# Patient Record
Sex: Female | Born: 1940 | Race: White | Hispanic: No | Marital: Married
Health system: Southern US, Community
[De-identification: ages and names within clinical notes are randomized; demographics above are authoritative.]

## PROBLEM LIST (undated history)

## (undated) DIAGNOSIS — I483 Typical atrial flutter: Secondary | ICD-10-CM

## (undated) DIAGNOSIS — M109 Gout, unspecified: Secondary | ICD-10-CM

## (undated) DIAGNOSIS — I10 Essential (primary) hypertension: Secondary | ICD-10-CM

## (undated) DIAGNOSIS — E875 Hyperkalemia: Secondary | ICD-10-CM

## (undated) DIAGNOSIS — J449 Chronic obstructive pulmonary disease, unspecified: Secondary | ICD-10-CM

## (undated) DIAGNOSIS — Z954 Presence of other heart-valve replacement: Secondary | ICD-10-CM

## (undated) DIAGNOSIS — R011 Cardiac murmur, unspecified: Secondary | ICD-10-CM

## (undated) DIAGNOSIS — G4710 Hypersomnia, unspecified: Secondary | ICD-10-CM

## (undated) DIAGNOSIS — N289 Disorder of kidney and ureter, unspecified: Secondary | ICD-10-CM

## (undated) DIAGNOSIS — I5189 Other ill-defined heart diseases: Secondary | ICD-10-CM

## (undated) DIAGNOSIS — K8020 Calculus of gallbladder without cholecystitis without obstruction: Secondary | ICD-10-CM

## (undated) DIAGNOSIS — I219 Acute myocardial infarction, unspecified: Secondary | ICD-10-CM

## (undated) DIAGNOSIS — N200 Calculus of kidney: Secondary | ICD-10-CM

## (undated) DIAGNOSIS — Z5189 Encounter for other specified aftercare: Secondary | ICD-10-CM

## (undated) DIAGNOSIS — N183 Chronic kidney disease, stage 3 (moderate): Secondary | ICD-10-CM

## (undated) DIAGNOSIS — I442 Atrioventricular block, complete: Secondary | ICD-10-CM

## (undated) DIAGNOSIS — Z950 Presence of cardiac pacemaker: Secondary | ICD-10-CM

## (undated) DIAGNOSIS — G4733 Obstructive sleep apnea (adult) (pediatric): Secondary | ICD-10-CM

## (undated) DIAGNOSIS — K219 Gastro-esophageal reflux disease without esophagitis: Secondary | ICD-10-CM

## (undated) DIAGNOSIS — K635 Polyp of colon: Secondary | ICD-10-CM

## (undated) DIAGNOSIS — L409 Psoriasis, unspecified: Secondary | ICD-10-CM

## (undated) DIAGNOSIS — F32A Depression, unspecified: Secondary | ICD-10-CM

## (undated) DIAGNOSIS — F329 Major depressive disorder, single episode, unspecified: Secondary | ICD-10-CM

## (undated) DIAGNOSIS — R8281 Pyuria: Secondary | ICD-10-CM

## (undated) DIAGNOSIS — E119 Type 2 diabetes mellitus without complications: Secondary | ICD-10-CM

## (undated) DIAGNOSIS — I422 Other hypertrophic cardiomyopathy: Secondary | ICD-10-CM

## (undated) HISTORY — PX: COLONOSCOPY: SHX174

## (undated) HISTORY — PX: TONSILLECTOMY: SUR1361

## (undated) HISTORY — DX: Psoriasis, unspecified: L40.9

## (undated) HISTORY — DX: Encounter for other specified aftercare: Z51.89

## (undated) HISTORY — DX: Calculus of gallbladder without cholecystitis without obstruction: K80.20

## (undated) HISTORY — DX: Hyperkalemia: E87.5

## (undated) HISTORY — DX: Cardiac murmur, unspecified: R01.1

## (undated) HISTORY — PX: BREAST CYST EXCISION: SHX579

## (undated) HISTORY — DX: Hypersomnia, unspecified: G47.10

## (undated) HISTORY — PX: APPENDECTOMY: SHX54

## (undated) HISTORY — DX: Other ill-defined heart diseases: I51.89

## (undated) HISTORY — PX: TOOTH EXTRACTION: SUR596

## (undated) HISTORY — DX: Other hypertrophic cardiomyopathy: I42.2

## (undated) HISTORY — PX: ABDOMINAL HYSTERECTOMY: SHX81

## (undated) HISTORY — DX: Major depressive disorder, single episode, unspecified: F32.9

## (undated) HISTORY — DX: Polyp of colon: K63.5

## (undated) HISTORY — DX: Chronic kidney disease, stage 3 (moderate): N18.3

## (undated) HISTORY — DX: Depression, unspecified: F32.A

## (undated) HISTORY — DX: Obstructive sleep apnea (adult) (pediatric): G47.33

## (undated) HISTORY — DX: Pyuria: R82.81

## (undated) HISTORY — DX: Gout, unspecified: M10.9

## (undated) HISTORY — DX: Calculus of kidney: N20.0

## (undated) HISTORY — DX: Gastro-esophageal reflux disease without esophagitis: K21.9

## (undated) HISTORY — DX: Disorder of kidney and ureter, unspecified: N28.9

## (undated) HISTORY — PX: POLYPECTOMY: SHX149

## (undated) HISTORY — DX: Presence of other heart-valve replacement: Z95.4

## (undated) HISTORY — PX: CHOLECYSTECTOMY: SHX55

## (undated) HISTORY — DX: Typical atrial flutter: I48.3

## (undated) HISTORY — DX: Type 2 diabetes mellitus without complications: E11.9

## (undated) HISTORY — DX: Atrioventricular block, complete: I44.2

## (undated) HISTORY — DX: Essential (primary) hypertension: I10

## (undated) HISTORY — DX: Chronic obstructive pulmonary disease, unspecified: J44.9

---

## 2007-06-15 ENCOUNTER — Ambulatory Visit (HOSPITAL_BASED_OUTPATIENT_CLINIC_OR_DEPARTMENT_OTHER): Admission: RE | Admit: 2007-06-15 | Discharge: 2007-06-15 | Payer: Self-pay | Admitting: *Deleted

## 2007-06-20 ENCOUNTER — Ambulatory Visit: Payer: Self-pay | Admitting: Internal Medicine

## 2007-07-09 ENCOUNTER — Ambulatory Visit: Payer: Self-pay | Admitting: Pulmonary Disease

## 2007-08-09 DIAGNOSIS — I119 Hypertensive heart disease without heart failure: Secondary | ICD-10-CM | POA: Insufficient documentation

## 2007-08-09 DIAGNOSIS — J309 Allergic rhinitis, unspecified: Secondary | ICD-10-CM | POA: Insufficient documentation

## 2007-08-10 ENCOUNTER — Ambulatory Visit: Payer: Self-pay | Admitting: Pulmonary Disease

## 2007-08-10 DIAGNOSIS — G4733 Obstructive sleep apnea (adult) (pediatric): Secondary | ICD-10-CM | POA: Insufficient documentation

## 2007-11-19 ENCOUNTER — Telehealth (INDEPENDENT_AMBULATORY_CARE_PROVIDER_SITE_OTHER): Payer: Self-pay | Admitting: *Deleted

## 2007-11-22 ENCOUNTER — Ambulatory Visit: Payer: Self-pay | Admitting: Pulmonary Disease

## 2007-11-26 ENCOUNTER — Encounter: Payer: Self-pay | Admitting: Pulmonary Disease

## 2007-12-22 ENCOUNTER — Encounter: Payer: Self-pay | Admitting: Pulmonary Disease

## 2008-01-04 ENCOUNTER — Ambulatory Visit: Payer: Self-pay | Admitting: Pulmonary Disease

## 2008-08-10 ENCOUNTER — Ambulatory Visit: Payer: Self-pay | Admitting: Internal Medicine

## 2008-08-10 DIAGNOSIS — R011 Cardiac murmur, unspecified: Secondary | ICD-10-CM | POA: Insufficient documentation

## 2008-08-10 DIAGNOSIS — J209 Acute bronchitis, unspecified: Secondary | ICD-10-CM | POA: Insufficient documentation

## 2008-08-10 DIAGNOSIS — R209 Unspecified disturbances of skin sensation: Secondary | ICD-10-CM | POA: Insufficient documentation

## 2008-08-10 DIAGNOSIS — R9431 Abnormal electrocardiogram [ECG] [EKG]: Secondary | ICD-10-CM | POA: Insufficient documentation

## 2008-08-10 DIAGNOSIS — K219 Gastro-esophageal reflux disease without esophagitis: Secondary | ICD-10-CM | POA: Insufficient documentation

## 2008-08-10 DIAGNOSIS — N259 Disorder resulting from impaired renal tubular function, unspecified: Secondary | ICD-10-CM | POA: Insufficient documentation

## 2008-08-10 DIAGNOSIS — J440 Chronic obstructive pulmonary disease with acute lower respiratory infection: Secondary | ICD-10-CM

## 2008-08-10 LAB — CONVERTED CEMR LAB
Albumin: 3.8 g/dL (ref 3.5–5.2)
Alkaline Phosphatase: 103 units/L (ref 39–117)
Basophils Relative: 0 % (ref 0.0–3.0)
Bilirubin Urine: NEGATIVE
Bilirubin, Direct: 0.1 mg/dL (ref 0.0–0.3)
CO2: 32 meq/L (ref 19–32)
Calcium: 9.8 mg/dL (ref 8.4–10.5)
Crystals: NEGATIVE
Eosinophils Relative: 2.5 % (ref 0.0–5.0)
Glucose, Bld: 99 mg/dL (ref 70–99)
HCT: 43.8 % (ref 36.0–46.0)
Hemoglobin: 14.8 g/dL (ref 12.0–15.0)
Lymphocytes Relative: 16.4 % (ref 12.0–46.0)
Monocytes Absolute: 0.6 10*3/uL (ref 0.1–1.0)
Monocytes Relative: 8.2 % (ref 3.0–12.0)
Mucus, UA: NEGATIVE
Neutrophils Relative %: 72.9 % (ref 43.0–77.0)
Platelets: 222 10*3/uL (ref 150–400)
RBC: 4.94 M/uL (ref 3.87–5.11)
RDW: 12.8 % (ref 11.5–14.6)
Specific Gravity, Urine: >=1.03 (ref 1.000–1.03)
TSH: 1.93 microintl units/mL (ref 0.35–5.50)
Total Protein: 7.8 g/dL (ref 6.0–8.3)
pH: 6 (ref 5.0–8.0)

## 2008-08-11 ENCOUNTER — Encounter: Payer: Self-pay | Admitting: Internal Medicine

## 2008-08-11 ENCOUNTER — Telehealth: Payer: Self-pay | Admitting: Internal Medicine

## 2008-08-14 ENCOUNTER — Telehealth: Payer: Self-pay | Admitting: Internal Medicine

## 2008-08-16 ENCOUNTER — Ambulatory Visit: Payer: Self-pay | Admitting: Pulmonary Disease

## 2008-08-18 ENCOUNTER — Encounter: Payer: Self-pay | Admitting: Internal Medicine

## 2008-08-18 ENCOUNTER — Ambulatory Visit: Payer: Self-pay

## 2008-08-21 ENCOUNTER — Encounter: Payer: Self-pay | Admitting: Internal Medicine

## 2008-08-25 ENCOUNTER — Ambulatory Visit: Payer: Self-pay | Admitting: Internal Medicine

## 2008-08-25 DIAGNOSIS — R82998 Other abnormal findings in urine: Secondary | ICD-10-CM | POA: Insufficient documentation

## 2008-08-25 DIAGNOSIS — I5032 Chronic diastolic (congestive) heart failure: Secondary | ICD-10-CM | POA: Insufficient documentation

## 2008-08-25 LAB — CONVERTED CEMR LAB
Ketones, ur: NEGATIVE mg/dL
Mucus, UA: NEGATIVE
Nitrite: NEGATIVE
Specific Gravity, Urine: >=1.03 (ref 1.000–1.03)
Urine Glucose: NEGATIVE mg/dL
Urobilinogen, UA: 1 (ref 0.0–1.0)
pH: 5 (ref 5.0–8.0)

## 2008-08-27 ENCOUNTER — Encounter: Payer: Self-pay | Admitting: Internal Medicine

## 2008-09-12 ENCOUNTER — Encounter: Payer: Self-pay | Admitting: Internal Medicine

## 2008-09-20 ENCOUNTER — Encounter: Payer: Self-pay | Admitting: Internal Medicine

## 2008-11-08 ENCOUNTER — Encounter: Payer: Self-pay | Admitting: Internal Medicine

## 2008-11-28 ENCOUNTER — Ambulatory Visit: Payer: Self-pay | Admitting: Internal Medicine

## 2008-11-28 DIAGNOSIS — F324 Major depressive disorder, single episode, in partial remission: Secondary | ICD-10-CM | POA: Insufficient documentation

## 2008-12-13 ENCOUNTER — Ambulatory Visit: Payer: Self-pay | Admitting: Internal Medicine

## 2008-12-13 LAB — CONVERTED CEMR LAB
Chloride: 105 meq/L (ref 96–112)
Hemoglobin, Urine: NEGATIVE
Magnesium: 2.2 mg/dL (ref 1.5–2.5)
Sodium: 140 meq/L (ref 135–145)
Specific Gravity, Urine: 1.025 (ref 1.000–1.030)
Urine Glucose: NEGATIVE mg/dL
Urobilinogen, UA: 0.2 (ref 0.0–1.0)

## 2009-01-17 ENCOUNTER — Telehealth: Payer: Self-pay | Admitting: Internal Medicine

## 2009-03-14 ENCOUNTER — Ambulatory Visit: Payer: Self-pay | Admitting: Internal Medicine

## 2009-04-24 ENCOUNTER — Telehealth: Payer: Self-pay | Admitting: Internal Medicine

## 2009-05-02 ENCOUNTER — Ambulatory Visit: Payer: Self-pay | Admitting: Internal Medicine

## 2009-05-16 ENCOUNTER — Encounter: Payer: Self-pay | Admitting: Internal Medicine

## 2009-07-11 ENCOUNTER — Ambulatory Visit: Payer: Self-pay | Admitting: Internal Medicine

## 2009-08-04 DIAGNOSIS — I219 Acute myocardial infarction, unspecified: Secondary | ICD-10-CM

## 2009-08-04 HISTORY — DX: Acute myocardial infarction, unspecified: I21.9

## 2009-08-15 ENCOUNTER — Encounter: Payer: Self-pay | Admitting: Pulmonary Disease

## 2009-08-16 ENCOUNTER — Ambulatory Visit: Payer: Self-pay | Admitting: Pulmonary Disease

## 2009-08-16 DIAGNOSIS — G4710 Hypersomnia, unspecified: Secondary | ICD-10-CM | POA: Insufficient documentation

## 2009-08-23 ENCOUNTER — Encounter: Payer: Self-pay | Admitting: Internal Medicine

## 2009-10-31 ENCOUNTER — Ambulatory Visit: Payer: Self-pay | Admitting: Internal Medicine

## 2009-10-31 LAB — CONVERTED CEMR LAB
AST: 24 units/L (ref 0–37)
Alkaline Phosphatase: 109 units/L (ref 39–117)
Basophils Absolute: 0.1 10*3/uL (ref 0.0–0.1)
Basophils Relative: 0.9 % (ref 0.0–3.0)
Bilirubin, Direct: 0.1 mg/dL (ref 0.0–0.3)
Calcium: 9.7 mg/dL (ref 8.4–10.5)
Chloride: 99 meq/L (ref 96–112)
Creatinine, Ser: 1.3 mg/dL — ABNORMAL HIGH (ref 0.4–1.2)
GFR calc non Af Amer: 43.52 mL/min (ref >60)
HDL: 43.8 mg/dL (ref >39.00)
Hemoglobin: 13.1 g/dL (ref 12.0–15.0)
LDL Cholesterol: 81 mg/dL (ref 0–99)
Lymphs Abs: 0.9 10*3/uL (ref 0.7–4.0)
MCHC: 33.4 g/dL (ref 30.0–36.0)
MCV: 90.4 fL (ref 78.0–100.0)
Monocytes Absolute: 0.5 10*3/uL (ref 0.1–1.0)
Monocytes Relative: 8.1 % (ref 3.0–12.0)
Neutro Abs: 4.3 10*3/uL (ref 1.4–7.7)
Neutrophils Relative %: 73.9 % (ref 43.0–77.0)
Platelets: 174 10*3/uL (ref 150.0–400.0)
Potassium: 4 meq/L (ref 3.5–5.1)
RBC: 4.34 M/uL (ref 3.87–5.11)
RDW: 12.3 % (ref 11.5–14.6)
Sodium: 137 meq/L (ref 135–145)
Tissue Transglutaminase Ab, IgA: 0 units (ref <7)
Total CHOL/HDL Ratio: 3
VLDL: 23.4 mg/dL (ref 0.0–40.0)

## 2009-11-01 ENCOUNTER — Encounter: Payer: Self-pay | Admitting: Internal Medicine

## 2009-11-07 ENCOUNTER — Encounter: Payer: Self-pay | Admitting: Internal Medicine

## 2009-11-14 ENCOUNTER — Encounter: Payer: Self-pay | Admitting: Internal Medicine

## 2009-11-29 ENCOUNTER — Encounter: Payer: Self-pay | Admitting: Pulmonary Disease

## 2009-12-20 ENCOUNTER — Ambulatory Visit: Payer: Self-pay | Admitting: Gastroenterology

## 2010-01-02 ENCOUNTER — Ambulatory Visit: Payer: Self-pay | Admitting: Internal Medicine

## 2010-01-10 ENCOUNTER — Encounter (INDEPENDENT_AMBULATORY_CARE_PROVIDER_SITE_OTHER): Payer: Self-pay | Admitting: *Deleted

## 2010-01-10 ENCOUNTER — Ambulatory Visit: Payer: Self-pay | Admitting: Gastroenterology

## 2010-01-30 ENCOUNTER — Ambulatory Visit: Payer: Self-pay | Admitting: Gastroenterology

## 2010-02-05 ENCOUNTER — Encounter: Payer: Self-pay | Admitting: Gastroenterology

## 2010-03-20 ENCOUNTER — Ambulatory Visit: Payer: Self-pay | Admitting: Internal Medicine

## 2010-04-02 ENCOUNTER — Ambulatory Visit: Payer: Self-pay | Admitting: Internal Medicine

## 2010-04-02 DIAGNOSIS — J45901 Unspecified asthma with (acute) exacerbation: Secondary | ICD-10-CM | POA: Insufficient documentation

## 2010-04-16 ENCOUNTER — Telehealth: Payer: Self-pay | Admitting: Internal Medicine

## 2010-04-24 ENCOUNTER — Ambulatory Visit: Payer: Self-pay | Admitting: Internal Medicine

## 2010-04-24 ENCOUNTER — Encounter: Payer: Self-pay | Admitting: Internal Medicine

## 2010-04-24 DIAGNOSIS — R079 Chest pain, unspecified: Secondary | ICD-10-CM | POA: Insufficient documentation

## 2010-04-24 LAB — CONVERTED CEMR LAB
BUN: 34 mg/dL — ABNORMAL HIGH (ref 6–23)
GFR calc non Af Amer: 33.71 mL/min (ref >60)
Glucose, Bld: 116 mg/dL — ABNORMAL HIGH (ref 70–99)
Sodium: 141 meq/L (ref 135–145)

## 2010-05-15 ENCOUNTER — Ambulatory Visit: Payer: Self-pay | Admitting: Internal Medicine

## 2010-05-15 DIAGNOSIS — E875 Hyperkalemia: Secondary | ICD-10-CM | POA: Insufficient documentation

## 2010-05-15 LAB — CONVERTED CEMR LAB
BUN: 26 mg/dL — ABNORMAL HIGH (ref 6–23)
Calcium: 9.7 mg/dL (ref 8.4–10.5)
Chloride: 100 meq/L (ref 96–112)
Creatinine, Ser: 1.4 mg/dL — ABNORMAL HIGH (ref 0.4–1.2)
GFR calc non Af Amer: 39.24 mL/min (ref >60)

## 2010-05-20 ENCOUNTER — Telehealth: Payer: Self-pay | Admitting: Internal Medicine

## 2010-05-21 ENCOUNTER — Ambulatory Visit: Payer: Self-pay | Admitting: Internal Medicine

## 2010-05-21 DIAGNOSIS — D692 Other nonthrombocytopenic purpura: Secondary | ICD-10-CM | POA: Insufficient documentation

## 2010-05-22 ENCOUNTER — Ambulatory Visit: Payer: Self-pay | Admitting: Cardiovascular Disease

## 2010-05-22 ENCOUNTER — Telehealth: Payer: Self-pay | Admitting: Internal Medicine

## 2010-05-22 DIAGNOSIS — I421 Obstructive hypertrophic cardiomyopathy: Secondary | ICD-10-CM | POA: Insufficient documentation

## 2010-06-18 ENCOUNTER — Telehealth (INDEPENDENT_AMBULATORY_CARE_PROVIDER_SITE_OTHER): Payer: Self-pay | Admitting: Radiology

## 2010-06-19 ENCOUNTER — Ambulatory Visit: Payer: Self-pay

## 2010-06-19 ENCOUNTER — Ambulatory Visit: Payer: Self-pay | Admitting: Cardiovascular Disease

## 2010-06-19 ENCOUNTER — Encounter: Payer: Self-pay | Admitting: Cardiovascular Disease

## 2010-06-19 ENCOUNTER — Ambulatory Visit (HOSPITAL_COMMUNITY): Admission: RE | Admit: 2010-06-19 | Discharge: 2010-06-19 | Payer: Self-pay | Admitting: Cardiovascular Disease

## 2010-06-19 ENCOUNTER — Encounter (HOSPITAL_COMMUNITY)
Admission: RE | Admit: 2010-06-19 | Discharge: 2010-08-03 | Payer: Self-pay | Source: Home / Self Care | Attending: Cardiovascular Disease | Admitting: Cardiovascular Disease

## 2010-07-17 ENCOUNTER — Ambulatory Visit: Payer: Self-pay | Admitting: Internal Medicine

## 2010-07-18 ENCOUNTER — Ambulatory Visit: Payer: Self-pay | Admitting: Cardiovascular Disease

## 2010-07-18 DIAGNOSIS — R609 Edema, unspecified: Secondary | ICD-10-CM | POA: Insufficient documentation

## 2010-08-14 ENCOUNTER — Encounter: Payer: Self-pay | Admitting: Pulmonary Disease

## 2010-08-15 ENCOUNTER — Ambulatory Visit
Admission: RE | Admit: 2010-08-15 | Discharge: 2010-08-15 | Payer: Self-pay | Source: Home / Self Care | Attending: Pulmonary Disease | Admitting: Pulmonary Disease

## 2010-08-29 ENCOUNTER — Encounter: Payer: Self-pay | Admitting: Internal Medicine

## 2010-08-29 LAB — HM MAMMOGRAPHY: HM Mammogram: NORMAL

## 2010-09-02 ENCOUNTER — Telehealth: Payer: Self-pay | Admitting: Internal Medicine

## 2010-09-03 ENCOUNTER — Telehealth: Payer: Self-pay | Admitting: Internal Medicine

## 2010-09-03 NOTE — Assessment & Plan Note (Signed)
Summary: diarrhea/discuss colonoscopy...em   History of Present Illness Visit Type: consult Primary GI MD: Verl Blalock MD [place] Primary Provider: Scarlette Calico, MD Requesting Provider: Scarlette Calico, MD Chief Complaint: diarrhea for several months, stool studies negative History of Present Illness:   70 year old Caucasian female referred by Dr.Jones for a waste of 3 months of diarrhea without rectal bleeding systemic complaints, abdominal pain, and she has had a negative laboratory workup including stool exams and celiac serologies.  She cholecystectomy 10-15 years ago this has some rectal urgency since that time. She takes Zantac daily for nonspecific dyspeptic complaints. She does not use sorbitol, fructose, or have any history of food intolerances and septa cheese. She does not use milk or other dairy products. She has no history of osteoporosis, anemia, but does have mild hypertension. She denies being on antibiotics in the last year. She's never had colonoscopy or barium studies. Family history noncontributory terms of any history of colon cancer or polyps. She denies anorexia or weight loss. She denies a history of chronic hepatobiliary complaints or recurrent pancreatitis.   GI Review of Systems      Denies abdominal pain, acid reflux, belching, bloating, chest pain, dysphagia with liquids, dysphagia with solids, heartburn, loss of appetite, nausea, vomiting, vomiting blood, weight loss, and  weight gain.      Reports diarrhea.     Denies anal fissure, black tarry stools, change in bowel habit, constipation, diverticulosis, fecal incontinence, heme positive stool, hemorrhoids, irritable bowel syndrome, jaundice, light color stool, liver problems, rectal bleeding, and  rectal pain.    Current Medications (verified): 1)  Zantac 300 Mg  Tabs (Ranitidine Hcl) .... Take 1 Tablet By Mouth Once A Day 2)  Spironolactone-Hctz 25-25 Mg  Tabs (Spironolactone-Hctz) .... Take 1  Tab By Mouth  Daily 3)  Allegra 180 Mg Tabs (Fexofenadine Hcl) .... Take 1 Tablet By Mouth Once A Day As Needed 4)  Cpap 5-15 Auto Titrate 5)  Luxiq 0.12 % Foam (Betamethasone Valerate) .... Apply To Scalp As Directed 6)  Vitamin D 2000 Unit Tabs (Cholecalciferol) .... Take 1 Tablet By Mouth Once A Day 7)  Lexapro 10 Mg Tabs (Escitalopram Oxalate) .... Take 1 Tablet By Mouth Once A Day 8)  Tekturna 150 Mg Tabs (Aliskiren Fumarate) .... One By Mouth Once Daily 9)  Mega Basic  Tabs (Multiple Vitamins-Minerals) .... Take 1 Tablet By Mouth Once A Day  Allergies (verified): 1)  ! Sulfa 2)  ! Nsaids 3)  ! Azor  Past History:  Past medical, surgical, family and social histories (including risk factors) reviewed for relevance to current acute and chronic problems.  Past Medical History: Reviewed history from 08/10/2008 and no changes required. Allergic Rhinitis Hypertension Allergic rhinitis COPD GERD Renal insufficiency Heart murmur Psoriasis  Past Surgical History: Appendectomy Cholecystectomy Hysterectomy Tonsillectomy C-Section  Family History: Reviewed history from 08/10/2008 and no changes required. Dementia Family History Breast cancer 1st degree relative <50 Family History Hypertension No FH of Colon Cancer: Family History of Ovarian Cancer: Mat Aunt Family History of Bladder Cancer: Father Family History of Heart Disease: MGF, PGF  Social History: Reviewed history from 08/10/2008 and no changes required. Married Never Smoked Alcohol use-no Drug use-no Regular exercise-no Daily Caffeine Use  Review of Systems  The patient denies allergy/sinus, anemia, anxiety-new, arthritis/joint pain, back pain, blood in urine, breast changes/lumps, change in vision, confusion, cough, coughing up blood, depression-new, fainting, fatigue, fever, headaches-new, hearing problems, heart murmur, heart rhythm changes, itching, menstrual pain, muscle pains/cramps,  night sweats, nosebleeds,  pregnancy symptoms, shortness of breath, skin rash, sleeping problems, sore throat, swelling of feet/legs, swollen lymph glands, thirst - excessive , urination - excessive , urination changes/pain, urine leakage, vision changes, and voice change.    Vital Signs:  Patient profile:   70 year old female Height:      63 inches Weight:      202 pounds BMI:     35.91 Pulse rate:   72 / minute Pulse rhythm:   regular BP sitting:   100 / 56  (left arm) Cuff size:   regular  Vitals Entered By: Abelino Derrick CMA Deborra Medina) (Dec 20, 2009 9:39 AM)  Physical Exam  General:  Well developed, well nourished, no acute distress.obese.   Head:  Normocephalic and atraumatic. Eyes:  PERRLA, no icterus.exam deferred to patient's ophthalmologist.   Neck:  Supple; no masses or thyromegaly. Lungs:  Clear throughout to auscultation. Heart:  Regular rate and rhythm; no murmurs, rubs,  or bruits.2/6 systolic ejection murmur at the base the heart without significant radiation noted. Abdomen:  Soft, nontender and nondistended. No masses, hepatosplenomegaly or hernias noted. Normal bowel sounds.obese.   Rectal:  Normal exam.Rectal stenosis noted but no mass or tenderness. There is semi-formed soft stool present which is of normal color and guaiac negative. Msk:  Symmetrical with no gross deformities. Normal posture. Extremities:  No clubbing, cyanosis, edema or deformities noted. Neurologic:  Alert and  oriented x4;  grossly normal neurologically. Cervical Nodes:  No significant cervical adenopathy. Psych:  Alert and cooperative. Normal mood and affect.anxious.     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 I suggested colonoscopy colon biopsies but she has refused. We will try Colestid 1 g in mid morning  to be increased to 2 g a day as needed with office followup in 2-3 weeks' time. Information concerning colonoscopy for colon cancer screening has been provided to the patient and her husband. He was present for the entire exam. Otherwise she has had an excellent diarrhea workup by Dr. Ronnald Ramp.. Celiac disease serologies were negative.  Problem # 2:  DEPRESSIVE DISORDER (ICD-311)  Problem # 3:  HEART MURMUR, SYSTOLIC (99991111.2) Assessment: Comment Only  Problem # 4:  RENAL INSUFFICIENCY (ICD-588.9) Assessment: Unchanged Not a candidate for fleets Phospho-Soda we'll prep for colonoscopy with a history of renal disease. I have reviewed of the colonoscopy were prepped with her today should she decide to proceed with colonoscopy.  Problem # 5:  GERD (ICD-530.81) Assessment: Improved She relates this is not a problem at this time. She takes p.r.n. Zantac, for reflux symptoms.  Patient Instructions: 1)  Begin Colestid 1 tab mid morning, This can be increased to two times a day if diarrhea is no better after one week. 2)  GI Cancer and Polyps brochure given.  3)  Please schedule a follow-up appointment in 2 weeks.  4)  The medication list was reviewed and reconciled.  All changed / newly prescribed medications were explained.  A complete medication list was provided to the patient / caregiver. 5)  Copy sent to : Dr. Vira Blanco 6)  Please continue current medications.   Appended Document: diarrhea/discuss colonoscopy...em    Clinical Lists Changes  Medications: Added new medication of COLESTIPOL HCL 1 GM TABS (COLESTIPOL HCL) 1 tab by mouth mid morning, may increase to two times a day if no improvement in  diarrhea. - Signed Rx of COLESTIPOL HCL 1 GM TABS (COLESTIPOL HCL) 1 tab by mouth mid morning, may increase to two times a day if no improvement in diarrhea.;  #60 x 6;  Signed;  Entered by: Alberteen Spindle RN;  Authorized by: Sable Feil MD [facility];  Method used: Electronically to [facility] # [address] [place], [place]  [address], Ph: [phone], Fax: [phone]    Prescriptions: COLESTIPOL HCL 1 GM TABS (COLESTIPOL HCL) 1 tab by mouth mid morning, may increase to two times a day if no improvement in diarrhea.  #60 x 6   Entered by:   Alberteen Spindle RN   Authorized by:   Sable Feil MD [facility]   Signed by:   Alberteen Spindle RN on 12/20/2009   Method used:   Electronically to        [facility] # [address]* (retail)       [place], [place]  [address]       Ph: [phone]       Fax: [phone]   RxID:   [id number]

## 2010-09-03 NOTE — Progress Notes (Signed)
Summary: Allergic Reaction????  Phone Note Call from Patient   Caller: Patient Summary of Call: pt left vm stating she thinks she is having allergic reaction.....c/o red blood blisters on Lt arm and nausea.  She started Tekturna-Hctz on [place] but did not notice symptoms until sat.  Please advise cont or DC?  She still has Tekturna without HCTZ Initial call taken by: Jonathon Resides, [facility]),  May 20, 2010 4:51 PM  Follow-up for Phone Call        this sounds like shingles, I will need to see the rash Follow-up by: Janith Lima MD,  May 20, 2010 4:58 PM  Additional Follow-up for Phone Call Additional follow up Details #1::        pt informed/ she is coming tom at 10:15am Additional Follow-up by: Jonathon Resides, [facility]),  May 20, 2010 5:09 PM

## 2010-09-03 NOTE — Assessment & Plan Note (Signed)
Summary: rov for osa, EDS   Primary Provider/Referring Provider:  Janith Lima MD  CC:  Pt is here for a 1 yr f/u appt.  Pt states she is using her cpap machine every night.  Approx 6 to 7 hours per night.  Pt denied any complaints with mask or pressure.  Marland Kitchen  History of Present Illness: The pt comes in today for f/u of her osa.  She is wearing cpap on autoset mode compliantly by download today, and denies any issues with mask or pressure.  Despite this, she is continuing to have significant EDS that is interfering with her QOL.  She has had sleepiness at times with driving.  She gets adequate quantity of sleep, although she does get to bed quite late.  She doesn't feel there are any environmental issues that are interfering with her sleep.  Medications Prior to Update: 1)  Zantac 300 Mg  Tabs (Ranitidine Hcl) .... Take 1 Tablet By Mouth Once A Day 2)  Spironolactone-Hctz 25-25 Mg  Tabs (Spironolactone-Hctz) .... Take 1  Tab By Mouth Daily 3)  Zyrtec Allergy 10 Mg  Tabs (Cetirizine Hcl) .... Take 1 Tab By Mouth At Bedtime As Needed 4)  Cpap 5-15 Auto Titrate 5)  Luxiq 0.12 % Foam (Betamethasone Valerate) .... Apply To Scalp As Directed 6)  Vitamin D 2000 Unit Tabs (Cholecalciferol) .... Take 1 Tablet By Mouth Once A Day 7)  Lexapro 10 Mg Tabs (Escitalopram Oxalate) .... Take 1 Tablet By Mouth Once A Day 8)  Tekturna 150 Mg Tabs (Aliskiren Fumarate) .... One By Mouth Once Daily 9)  Tussionex Pennkinetic Er 8-10 Mg/70ml Lqcr (Chlorpheniramine-Hydrocodone) .... 2.5 - 5 Ml By Mouth Two Times A Day As Needed Cough  Allergies (verified): 1)  ! Sulfa 2)  ! Nsaids 3)  ! Azor  Review of Systems      See HPI  Vital Signs:  Patient profile:   70 year old female Height:      63 inches Weight:      201 pounds BMI:     35.73 O2 Sat:      93 % on Room air Temp:     97.4 degrees F oral Pulse rate:   76 / minute BP sitting:   122 / 64  (left arm) Cuff size:   regular  Vitals Entered By: Matthew Folks LPN (January 13, 624THL 10:59 AM)  O2 Flow:  Room air CC: Pt is here for a 1 yr f/u appt.  Pt states she is using her cpap machine every night.  Approx 6 to 7 hours per night.  Pt denied any complaints with mask or pressure.   Comments Medications reviewed with patient Matthew Folks LPN  January 13, 624THL 10:59 AM    Physical Exam  General:  ow female in nad Nose:  no skin breakdown or pressure necrosis from cpap mask Neurologic:  alert, does not appear sleepy, moves all 4.   Impression & Recommendations:  Problem # 1:  OBSTRUCTIVE SLEEP APNEA (ICD-327.23)  She has been wearing cpap compliantly by her recent download, and is on autoset as her mode of pressure.  She is having no issue with mask or pressure tolerance.  I have stressed to her the need to work on weight loss.  Problem # 2:  HYPERSOMNIA (ICD-780.54)  the pt is having persistent EDS despite documented compliance with cpap, and working on environmental issues.  She is getting adequated quantity of sleep.  I have asked  her to consider doing a MSLT to evaluate her persistent sleepiness.  I suspect she is in the category of pts with good control of osa, but persistent sleepiness.  The pt would like to think about this, and will let me know.  Time spent with pt today discussing the above was 1min  Other Orders: Est. Patient Level III [id number])  Patient Instructions: 1)  stay on cpap 2)  work on weight loss 3)  consider further workup of your inappropriate daytime sleepiness with nap study at sleep center.  Please call if you wish to do this and I can set up. 4)  followup with me in one year.

## 2010-09-03 NOTE — Progress Notes (Signed)
  Phone Note Call from Patient   Summary of Call: Patient is requesting samples of tekturna 150, OK per MD. Pt was advised to resume this per cardiology's note. Samples ready, Pt informed  Initial call taken by: Charlsie Quest, [place],  May 22, 2010 10:14 AM

## 2010-09-03 NOTE — Assessment & Plan Note (Signed)
Summary: HAD A LETTER TO CALL FOR APPT FOR LABS/NWS   Vital Signs:  Patient profile:   70 year old female Height:      63 inches Weight:      196 pounds BMI:     34.85 O2 Sat:      93 % on Room air Temp:     97.5 degrees F oral Pulse rate:   84 / minute Pulse rhythm:   regular Resp:     16 per minute BP sitting:   100 / 52  (left arm) Cuff size:   large  Vitals Entered By: Estell Harpin CMA (May 15, 2010 11:34 AM)  Nutrition Counseling: Patient's BMI is greater than 25 and therefore counseled on weight management options.  O2 Flow:  Room air CC: follow-up visit//lab results, Hypertension Management Pain Assessment Patient in pain? no       Does patient need assistance? Functional Status Self care Ambulation Normal   Primary Care Provider:  Scarlette Calico, MD  CC:  follow-up visit//lab results and Hypertension Management.  History of Present Illness: She returns for f/up and to discuss recent labs that showed some decrease in kidney function and high K+. She feels like her BP is too low but she does not fell dizzy or SOB. She has had a few episodes of chest pain when she bends forward but no exertional pain or SOB.  Hypertension History:      She complains of side effects from treatment, but denies headache, palpitations, dyspnea with exertion, orthopnea, PND, peripheral edema, visual symptoms, neurologic problems, and syncope.  She notes the following problems with antihypertensive medication side effects: leg cramps, possible due to high K+.        Positive major cardiovascular risk factors include female age 35 years old or older and hypertension.  Negative major cardiovascular risk factors include no history of diabetes or hyperlipidemia, negative family history for ischemic heart disease, and non-tobacco-user status.        Positive history for target organ damage include hypertensive retinopathy.  Further assessment for target organ damage reveals no history of  ASHD, cardiac end-organ damage (CHF/LVH), stroke/TIA, peripheral vascular disease, or renal insufficiency.     Preventive Screening-Counseling & Management  Alcohol-Tobacco     Alcohol drinks/day: 0     Smoking Status: never     Passive Smoke Exposure: no     Tobacco Counseling: not indicated; no tobacco use  Hep-HIV-STD-Contraception     Hepatitis Risk: no risk noted     HIV Risk: no     STD Risk: no risk noted     SBE monthly: yes     SBE Education/Counseling: to perform regular SBE      Sexual History:  currently monogamous.        Drug Use:  never.        Blood Transfusions:  no.    Clinical Review Panels:  Prevention   Last Mammogram:  Normal Bilateral (08/23/2009)   Last Colonoscopy:  DONE (01/30/2010)  Immunizations   Last Flu Vaccine:  Fluvax 3+ (04/24/2010)   Last Pneumovax:  Pneumovax (08/10/2008)  Lipid Management   Cholesterol:  148 (10/31/2009)   LDL (bad choesterol):  81 (10/31/2009)   HDL (good cholesterol):  43.80 (10/31/2009)  Diabetes Management   Creatinine:  1.6 (04/24/2010)   Last Flu Vaccine:  Fluvax 3+ (04/24/2010)   Last Pneumovax:  Pneumovax (08/10/2008)  CBC   WBC:  6.0 (10/31/2009)   RBC:  4.34 (10/31/2009)  Hgb:  13.1 (10/31/2009)   Hct:  39.2 (10/31/2009)   Platelets:  174.0 (10/31/2009)   MCV  90.4 (10/31/2009)   MCHC  33.4 (10/31/2009)   RDW  12.3 (10/31/2009)   PMN:  73.9 (10/31/2009)   Lymphs:  14.6 (10/31/2009)   Monos:  8.1 (10/31/2009)   Eosinophils:  2.5 (10/31/2009)   Basophil:  0.9 (10/31/2009)  Complete Metabolic Panel   Glucose:  116 (04/24/2010)   Sodium:  141 (04/24/2010)   Potassium:  5.4 (04/24/2010)   Chloride:  102 (04/24/2010)   CO2:  29 (04/24/2010)   BUN:  34 (04/24/2010)   Creatinine:  1.6 (04/24/2010)   Albumin:  3.9 (10/31/2009)   Total Protein:  8.2 (10/31/2009)   Calcium:  10.4 (04/24/2010)   Total Bili:  0.9 (10/31/2009)   Alk Phos:  109 (10/31/2009)   SGPT (ALT):  22 (10/31/2009)   SGOT  (AST):  24 (10/31/2009)   Current Medications (verified): 1)  Zantac 300 Mg  Tabs (Ranitidine Hcl) .... Take 1 Tablet By Mouth Once A Day 2)  Spironolactone-Hctz 25-25 Mg  Tabs (Spironolactone-Hctz) .... Take 1  Tab By Mouth Daily 3)  Allegra 180 Mg Tabs (Fexofenadine Hcl) .... Take 1 Tablet By Mouth Once A Day As Needed 4)  Cpap 5-15 Auto Titrate 5)  Luxiq 0.12 % Foam (Betamethasone Valerate) .... Apply To Scalp As Directed 6)  Vitamin D 2000 Unit Tabs (Cholecalciferol) .... Take 1 Tablet By Mouth Once A Day 7)  Lexapro 10 Mg Tabs (Escitalopram Oxalate) .... Take 1 Tablet By Mouth Once A Day 8)  Tekturna 150 Mg Tabs (Aliskiren Fumarate) .... One By Mouth Once Daily 9)  Mega Basic  Tabs (Multiple Vitamins-Minerals) .... Take 1 Tablet By Mouth Once A Day 10)  Colestipol Hcl 1 Gm Tabs (Colestipol Hcl) .... Two Times A Day 11)  Symbicort 160-4.5 Mcg/act Aero (Budesonide-Formoterol Fumarate) .... 2 Puffs Two Times A Day For Asthma  Allergies (verified): 1)  ! Sulfa 2)  ! Nsaids 3)  ! Azor  Past History:  Past Medical History: Last updated: 08/10/2008 Allergic Rhinitis Hypertension Allergic rhinitis COPD GERD Renal insufficiency Heart murmur Psoriasis  Past Surgical History: Last updated: 12/20/2009 Appendectomy Cholecystectomy Hysterectomy Tonsillectomy C-Section  Family History: Last updated: 12/20/2009 Dementia Family History Breast cancer 1st degree relative <50 Family History Hypertension No FH of Colon Cancer: Family History of Ovarian Cancer: Mat Aunt Family History of Bladder Cancer: Father Family History of Heart Disease: MGF, PGF  Social History: Last updated: 12/20/2009 Married Never Smoked Alcohol use-no Drug use-no Regular exercise-no Daily Caffeine Use  Risk Factors: Alcohol Use: 0 (05/15/2010) Exercise: no (08/10/2008)  Risk Factors: Smoking Status: never (05/15/2010) Passive Smoke Exposure: no (05/15/2010)  Family History: Reviewed  history from 12/20/2009 and no changes required. Dementia Family History Breast cancer 1st degree relative <50 Family History Hypertension No FH of Colon Cancer: Family History of Ovarian Cancer: Mat Aunt Family History of Bladder Cancer: Father Family History of Heart Disease: MGF, PGF  Social History: Reviewed history from 12/20/2009 and no changes required. Married Never Smoked Alcohol use-no Drug use-no Regular exercise-no Daily Caffeine Use  Review of Systems       The patient complains of chest pain.  The patient denies weight loss, weight gain, syncope, dyspnea on exertion, peripheral edema, prolonged cough, headaches, hemoptysis, abdominal pain, suspicious skin lesions, and difficulty walking.   CV:  Complains of chest pain or discomfort; denies difficulty breathing at night, difficulty breathing while lying down, fainting, fatigue,  leg cramps with exertion, lightheadness, near fainting, palpitations, shortness of breath with exertion, swelling of feet, and weight gain.  Physical Exam  General:  alert, well-developed, well-nourished, well-hydrated, appropriate dress, normal appearance, healthy-appearing, cooperative to examination, and overweight-appearing.   Mouth:  Oral mucosa and oropharynx without lesions or exudates.  Teeth in good repair. Neck:  supple, full ROM, no masses, no thyromegaly, no thyroid nodules or tenderness, no JVD, normal carotid upstroke, no carotid bruits, no cervical lymphadenopathy, and no neck tenderness.   Lungs:  normal respiratory effort, no intercostal retractions, no accessory muscle use, normal breath sounds, no dullness, no fremitus, no crackles, and no wheezes.   Heart:  normal rate, regular rhythm, no gallop, no rub, no JVD, and Grade   1/6 systolic ejection murmur.   Abdomen:  soft, non-tender, normal bowel sounds, no distention, no masses, no guarding, no rigidity, no rebound tenderness, no abdominal hernia, no inguinal hernia, no  hepatomegaly, and no splenomegaly.   Msk:  No deformity or scoliosis noted of thoracic or lumbar spine.   Pulses:  R and L carotid,radial,femoral,dorsalis pedis and posterior tibial pulses are full and equal bilaterally Extremities:  No clubbing, cyanosis, edema, or deformity noted with normal full range of motion of all joints.   Neurologic:  No cranial nerve deficits noted. Station and gait are normal. Plantar reflexes are down-going bilaterally. DTRs are symmetrical throughout. Sensory, motor and coordinative functions appear intact. Skin:  turgor normal, color normal, no rashes, no suspicious lesions, no ecchymoses, no petechiae, and no purpura.   Cervical Nodes:  no anterior cervical adenopathy and no posterior cervical adenopathy.   Psych:  Cognition and judgment appear intact. Alert and cooperative with normal attention span and concentration. 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 The following medications were removed from the medication list:    Spironolactone-hctz 25-25 Mg Tabs (Spironolactone-hctz) .Marland Kitchen... Take 1  tab by mouth daily    Tekturna 150 Mg Tabs (Aliskiren fumarate) ..... One by mouth once daily Her updated medication list for this problem includes:    Tekturna Hct 150-12.5 Mg Tabs (Aliskiren-hydrochlorothiazide) ..... One by mouth once daily for high blood pressure  Orders: Venipuncture [id number]) TLB-BMP (Basic Metabolic Panel-BMET) (99991111)  BP today: 100/52 Prior BP: 108/60 (04/24/2010)  Prior 10 Yr Risk Heart Disease: Not enough information (08/10/2008)  Labs Reviewed: K+: 5.4 (04/24/2010) Creat: : 1.6 (04/24/2010)   Chol: 148 (10/31/2009)   HDL: 43.80  (10/31/2009)   LDL: 81 (10/31/2009)   TG: 117.0 (10/31/2009)  Problem # 3:  CHEST PAIN (ICD-786.50) Assessment: Unchanged  Problem # 4:  ABNORMAL ELECTROCARDIOGRAM (ICD-794.31) she would not let me do another EKG today, she said "you just did one", I will refer to Cardiology for evaluation Orders: Cardiology Referral (Cardiology)  Problem # 5:  RENAL INSUFFICIENCY (ICD-588.9) Assessment: Deteriorated  Complete Medication List: 1)  Zantac 300 Mg Tabs (Ranitidine hcl) .... Take 1 tablet by mouth once a day 2)  Allegra 180 Mg Tabs (Fexofenadine hcl) .... Take 1 tablet by mouth once a day as needed 3)  Cpap 5-15 Auto Titrate  4)  Luxiq 0.12 % Foam (Betamethasone valerate) .... Apply to scalp as directed 5)  Vitamin D 2000 Unit Tabs (Cholecalciferol) .... Take 1 tablet by  mouth once a day 6)  Lexapro 10 Mg Tabs (Escitalopram oxalate) .... Take 1 tablet by mouth once a day 7)  Mega Basic Tabs (Multiple vitamins-minerals) .... Take 1 tablet by mouth once a day 8)  Colestipol Hcl 1 Gm Tabs (Colestipol hcl) .... Two times a day 9)  Symbicort 160-4.5 Mcg/act Aero (Budesonide-formoterol fumarate) .... 2 puffs two times a day for asthma 10)  Tekturna Hct 150-12.5 Mg Tabs (Aliskiren-hydrochlorothiazide) .... One by mouth once daily for high blood pressure  Hypertension Assessment/Plan:      The patient's hypertensive risk group is category C: Target organ damage and/or diabetes.  Her calculated 10 year risk of coronary heart disease is 6 %.  Today's blood pressure is 100/52.  Her blood pressure goal is < 140/90.  Patient Instructions: 1)  Please schedule a follow-up appointment in 1 month. 2)  It is important that you exercise regularly at least 20 minutes 5 times a week. If you develop chest pain, have severe difficulty breathing, or feel very tired , stop exercising immediately and seek medical attention. 3)  You need to lose weight. Consider a lower calorie diet and regular exercise.  4)  Check  your Blood Pressure regularly. If it is above 130/80: you should make an appointment. Prescriptions: TEKTURNA HCT 150-12.5 MG TABS (ALISKIREN-HYDROCHLOROTHIAZIDE) One by mouth once daily for high blood pressure  #56 x 0   Entered and Authorized by:   Janith Lima MD   Signed by:   Janith Lima MD on 05/15/2010   Method used:   Samples Given   RxID:   [id number]

## 2010-09-03 NOTE — Progress Notes (Signed)
Summary: nuc pre-procedure  Phone Note Outgoing Call   Call placed by: Charlton Amor, CNMT,  June 18, 2010 11:59 AM Call placed to: Patient Reason for Call: Confirm/change Appt Summary of Call: Reviewed information on [facility] Information Sheet (see scanned document for further details).  Spoke with patient.  Initial call taken by: Charlton Amor, CNMT,  June 18, 2010 11:59 AM     Nuclear Med Background Indications for Stress Test: Evaluation for Ischemia  Indications Comments: Abn. Echo and EKG  History: Asthma, COPD, Echo  History Comments: 11/10 Echo EF= 60% / LVH & HOCM ,OSA/CPAP  Symptoms: Chest Pain    Nuclear Pre-Procedure Cardiac Risk Factors: Hypertension Height (in): 63

## 2010-09-03 NOTE — Assessment & Plan Note (Signed)
Summary: F/U APPT/CD   Vital Signs:  Patient profile:   70 year old female Height:      63 inches Weight:      197 pounds BMI:     35.02 O2 Sat:      97 % on Room air Temp:     97.4 degrees F oral Pulse rate:   73 / minute Pulse rhythm:   regular Resp:     16 per minute BP sitting:   122 / 62  (left arm) Cuff size:   large  Vitals Entered By: Culver (October 31, 2009 2:01 PM)  Nutrition Counseling: Patient's BMI is greater than 25 and therefore counseled on weight management options.  O2 Flow:  Room air CC: follow-up visit, Diarrhea, Preventive Care Is Patient Diabetic? No Pain Assessment Patient in pain? no        Primary Care Provider:  Janith Lima MD  CC:  follow-up visit, Diarrhea, and Preventive Care.  History of Present Illness:  Diarrhea      This is a 70 year old woman who presents with Diarrhea.  The symptoms began 2 months ago.  The severity is described as moderate.  The patient reports 3 stools or less per day, semiformed/loose stools, and gradual onset of symptoms, but denies voluminous stools, blood in stool, mucus in stool, greasy stools, malodorous stools, fecal urgency, fecal soiling, alternating diarrhea/constipation, nocturnal diarrhea, fasting diarrhea, bloating, and gassiness.  The patient denies fever, abdominal pain, abdominal cramps, nausea, vomiting, lightheadedness, increased thirst, weight loss, joint pains, mouth ulcers, and eye redness.  The symptoms are worse with any food.  The symptoms are better with fasting.  Patient has a  history of cholecystectomy.    She also requests a complete physical today.  Preventive Screening-Counseling & Management  Alcohol-Tobacco     Alcohol drinks/day: 0     Smoking Status: never     Passive Smoke Exposure: no  Hep-HIV-STD-Contraception     Hepatitis Risk: no risk noted     HIV Risk: no     STD Risk: no risk noted     SBE monthly: yes     SBE Education/Counseling: to perform regular  SBE  Safety-Violence-Falls     Seat Belt Use: yes     Helmet Use: yes     Firearms in the Home: no firearms in the home     Smoke Detectors: yes     Violence in the Home: no risk noted     Sexual Abuse: no      Sexual History:  currently monogamous.        Drug Use:  never.        Blood Transfusions:  no.    Clinical Review Panels:  Prevention   Last Mammogram:  Normal Bilateral (08/23/2009)  Immunizations   Last Flu Vaccine:  Fluvax 3+ (05/02/2009)   Last Pneumovax:  Pneumovax (08/10/2008)  Diabetes Management   Creatinine:  1.2 (12/13/2008)   Last Flu Vaccine:  Fluvax 3+ (05/02/2009)   Last Pneumovax:  Pneumovax (08/10/2008)  CBC   WBC:  7.0 (08/10/2008)   RBC:  4.94 (08/10/2008)   Hgb:  14.8 (08/10/2008)   Hct:  43.8 (08/10/2008)   Platelets:  222 (08/10/2008)   MCV  88.8 (08/10/2008)   MCHC  33.8 (08/10/2008)   RDW  12.8 (08/10/2008)   PMN:  72.9 (08/10/2008)   Lymphs:  16.4 (08/10/2008)   Monos:  8.2 (08/10/2008)   Eosinophils:  2.5 (08/10/2008)  Basophil:  0.0 (08/10/2008)  Complete Metabolic Panel   Glucose:  121 (12/13/2008)   Sodium:  140 (12/13/2008)   Potassium:  4.1 (12/13/2008)   Chloride:  105 (12/13/2008)   CO2:  32 (12/13/2008)   BUN:  25 (12/13/2008)   Creatinine:  1.2 (12/13/2008)   Albumin:  3.8 (08/10/2008)   Total Protein:  7.8 (08/10/2008)   Calcium:  9.4 (12/13/2008)   Total Bili:  0.7 (08/10/2008)   Alk Phos:  103 (08/10/2008)   SGPT (ALT):  27 (08/10/2008)   SGOT (AST):  30 (08/10/2008)   Current Medications (verified): 1)  Zantac 300 Mg  Tabs (Ranitidine Hcl) .... Take 1 Tablet By Mouth Once A Day 2)  Spironolactone-Hctz 25-25 Mg  Tabs (Spironolactone-Hctz) .... Take 1  Tab By Mouth Daily 3)  Zyrtec Allergy 10 Mg  Tabs (Cetirizine Hcl) .... Take 1 Tab By Mouth At Bedtime As Needed 4)  Cpap 5-15 Auto Titrate 5)  Luxiq 0.12 % Foam (Betamethasone Valerate) .... Apply To Scalp As Directed 6)  Vitamin D 2000 Unit Tabs  (Cholecalciferol) .... Take 1 Tablet By Mouth Once A Day 7)  Lexapro 10 Mg Tabs (Escitalopram Oxalate) .... Take 1 Tablet By Mouth Once A Day 8)  Tekturna 150 Mg Tabs (Aliskiren Fumarate) .... One By Mouth Once Daily  Allergies (verified): 1)  ! Sulfa 2)  ! Nsaids 3)  ! Azor  Past History:  Past Medical History: Reviewed history from 08/10/2008 and no changes required. Allergic Rhinitis Hypertension Allergic rhinitis COPD GERD Renal insufficiency Heart murmur Psoriasis  Past Surgical History: Reviewed history from 08/10/2008 and no changes required. Appendectomy Cholecystectomy Hysterectomy Tonsillectomy  Family History: Reviewed history from 08/10/2008 and no changes required. Dementia Family History Breast cancer 1st degree relative <50 Family History Hypertension  Social History: Reviewed history from 08/10/2008 and no changes required. Married Never Smoked Alcohol use-no Drug use-no Regular exercise-no Hepatitis Risk:  no risk noted STD Risk:  no risk noted Seat Belt Use:  yes Sexual History:  currently monogamous Drug Use:  never Blood Transfusions:  no  Review of Systems       The patient complains of weight gain.  The patient denies anorexia, fever, weight loss, chest pain, syncope, dyspnea on exertion, peripheral edema, prolonged cough, headaches, hemoptysis, abdominal pain, melena, hematochezia, severe indigestion/heartburn, hematuria, suspicious skin lesions, depression, enlarged lymph nodes, angioedema, and breast masses.   CV:  Denies chest pain or discomfort, difficulty breathing at night, fainting, fatigue, leg cramps with exertion, lightheadness, near fainting, palpitations, shortness of breath with exertion, swelling of feet, and weight gain. GU:  Denies discharge, dysuria, hematuria, incontinence, nocturia, urinary frequency, and urinary hesitancy. Psych:  Denies alternate hallucination ( auditory/visual), anxiety, depression, easily angered,  easily tearful, irritability, panic attacks, sense of great danger, suicidal thoughts/plans, and thoughts /plans of harming others.  Physical Exam  General:  alert, well-developed, well-nourished, well-hydrated, and overweight-appearing.   Head:  normocephalic, atraumatic, no abnormalities observed, and no abnormalities palpated.   Eyes:  vision grossly intact, pupils equal, pupils round, and pupils reactive to light.   Mouth:  Oral mucosa and oropharynx without lesions or exudates.  Teeth in good repair. Neck:  supple, full ROM, no masses, and no thyromegaly.   Chest Wall:  No deformities, masses, or tenderness noted. Breasts:  No mass, nodules, thickening, tenderness, bulging, retraction, inflamation, nipple discharge or skin changes noted.   Lungs:  Normal respiratory effort, chest expands symmetrically. Lungs are clear to auscultation, no crackles or wheezes.  Heart:  normal rate and Grade  1/6 systolic ejection murmur, heard best in RUSB, but also heard along LLSB.  normal rate, no gallop, no rub, no JVD, no heaves. Abdomen:  soft, non-tender, normal bowel sounds, no hepatomegaly, and no splenomegaly.  no distention, no masses, no guarding, no rigidity, and abdominal scar(s).   Rectal:  no external abnormalities, normal sphincter tone, no masses, no tenderness, no fissures, no fistulae, no perianal rash, and external hemorrhoid(s).  heme neg. stool. Genitalia:  normal introitus and no external lesions.   Msk:  No deformity or scoliosis noted of thoracic or lumbar spine.   Pulses:  R and L carotid,radial,femoral,dorsalis pedis and posterior tibial pulses are full and equal bilaterally Extremities:  No clubbing, cyanosis, edema, or deformity noted with normal full range of motion of all joints.   Neurologic:  No cranial nerve deficits noted. Station and gait are normal. Plantar reflexes are down-going bilaterally. DTRs are symmetrical throughout. Sensory, motor and coordinative functions appear  intact. Skin:  turgor normal, color normal, no rashes, no suspicious lesions, no ecchymoses, no petechiae, no purpura, no ulcerations, no edema, solar damage, and seborrheic keratosis.   Cervical Nodes:  no anterior cervical adenopathy and no posterior cervical adenopathy.   Axillary Nodes:  no R axillary adenopathy and no L axillary adenopathy.   Inguinal Nodes:  no R inguinal adenopathy and no L inguinal adenopathy.   Psych:  Cognition and judgment appear intact. Alert and cooperative with normal attention span and concentration. No apparent delusions, illusions, hallucinations   Impression & Recommendations:  Problem # 1:  DIARRHEA (ICD-787.91) this may be infectious so will check stool studies and look at labs. Orders: T-Stool for O&P [id number]) T-Culture, C-Diff Toxin A/B [id number]) Venipuncture [id number]) T-Sprue Panel (Celiac Disease Aby Eval) (83516x3/[id number]) Gastroenterology Referral (GI) TLB-Lipid Panel (80061-LIPID) TLB-BMP (Basic Metabolic Panel-BMET) (99991111) TLB-CBC Platelet - w/Differential (85025-CBCD) TLB-Hepatic/Liver Function Pnl (80076-HEPATIC) TLB-TSH (Thyroid Stimulating Hormone) (84443-TSH)  Problem # 2:  ROUTINE GENERAL MEDICAL EXAM@HEALTH  CARE FACL (ICD-V70.0)  Orders: Gastroenterology Referral (GI) Subsequent annual wellness visit with prevention plan [id number])  Mammogram: Normal Bilateral (08/23/2009) Flu Vax: Fluvax 3+ (05/02/2009)   Pneumovax: Pneumovax (08/10/2008) TSH: 1.93 (08/10/2008)    Discussed using sunscreen, use of alcohol, drug use, self breast exam, routine dental care, routine eye care, schedule for GYN exam, routine physical exam, seat belts, multiple vitamins, osteoporosis prevention, adequate calcium intake in diet, recommendations for immunizations, mammograms and Pap smears.  Discussed exercise and checking cholesterol.  Discussed gun safety, safe sex, and contraception.  Problem # 3:  DIASTOLIC DYSFUNCTION  (0000000.9) Assessment: Unchanged  Problem # 4:  HYPERTENSION (ICD-401.9) Assessment: Improved  Her updated medication list for this problem includes:    Spironolactone-hctz 25-25 Mg Tabs (Spironolactone-hctz) .Marland Kitchen... Take 1  tab by mouth daily    Tekturna 150 Mg Tabs (Aliskiren fumarate) ..... One by mouth once daily  Orders: Venipuncture [id number]) TLB-Lipid Panel (80061-LIPID) TLB-BMP (Basic Metabolic Panel-BMET) (99991111) TLB-CBC Platelet - w/Differential (85025-CBCD) TLB-Hepatic/Liver Function Pnl (80076-HEPATIC) TLB-TSH (Thyroid Stimulating Hormone) (84443-TSH)  BP today: 122/62 Prior BP: 122/64 (08/16/2009)  Prior 10 Yr Risk Heart Disease: Not enough information (08/10/2008)  Labs Reviewed: K+: 4.1 (12/13/2008) Creat: : 1.2 (12/13/2008)     Problem # 5:  DEPRESSIVE DISORDER (ICD-311) Assessment: Unchanged  Her updated medication list for this problem includes:    Lexapro 10 Mg Tabs (Escitalopram oxalate) .Marland Kitchen... Take 1 tablet by mouth once a day  Discussed treatment options, including trial of antidpressant medication. Will refer  to behavioral health. Follow-up call in in 24-48 hours and recheck in 2 weeks, sooner as needed. Patient agrees to call if any worsening of symptoms or thoughts of doing harm arise. Verified that the patient has no suicidal ideation at this time.   Problem # 6:  HEART MURMUR, SYSTOLIC (99991111.2) Assessment: Unchanged  Complete Medication List: 1)  Zantac 300 Mg Tabs (Ranitidine hcl) .... Take 1 tablet by mouth once a day 2)  Spironolactone-hctz 25-25 Mg Tabs (Spironolactone-hctz) .... Take 1  tab by mouth daily 3)  Zyrtec Allergy 10 Mg Tabs (Cetirizine hcl) .... Take 1 tab by mouth at bedtime as needed 4)  Cpap 5-15 Auto Titrate  5)  Luxiq 0.12 % Foam (Betamethasone valerate) .... Apply to scalp as directed 6)  Vitamin D 2000 Unit Tabs (Cholecalciferol) .... Take 1 tablet by mouth once a day 7)  Lexapro 10 Mg Tabs (Escitalopram oxalate)  .... Take 1 tablet by mouth once a day 8)  Tekturna 150 Mg Tabs (Aliskiren fumarate) .... One by mouth once daily  Colorectal Screening:  Current Recommendations:    Hemoccult: NEG X 1 today; Given X 3    Colonoscopy recommended: scheduled with G.I.  PAP Screening:    Hx Cervical Dysplasia in last 5 yrs? No    3 normal PAP smears in last 5 yrs? No    Reviewed PAP smear recommendations:  patient refuses understanding risks of delayed diagnosis  Mammogram Screening:    Last Mammogram:  08/23/2009  Mammogram Results:    Date of Exam:  08/23/2009    Results:  Normal Bilateral  Osteoporosis Risk Assessment:  Risk Factors for Fracture or Low Bone Density:   Race (White or Asian):     yes   Smoking status:       never  Immunization & Chemoprophylaxis:    Influenza vaccine: Fluvax 3+  (05/02/2009)    Pneumovax: Pneumovax  (08/10/2008)  Patient Instructions: 1)  Please schedule a follow-up appointment in 2 months. 2)  It is important that you exercise regularly at least 20 minutes 5 times a week. If you develop chest pain, have severe difficulty breathing, or feel very tired , stop exercising immediately and seek medical attention. 3)  You need to lose weight. Consider a lower calorie diet and regular exercise.  4)  Schedule your mammogram. 5)  Schedule a colonoscopy/sigmoidoscopy to help detect colon cancer. 6)  You need to have a Pap Smear to prevent cervical cancer. 7)  Check your Blood Pressure regularly. If it is above 130/80: you should make an appointment.

## 2010-09-03 NOTE — Assessment & Plan Note (Signed)
Summary: Cardiology Nuclear Testing  Nuclear Med Background Indications for Stress Test: Evaluation for Ischemia  Indications Comments: Abn. Echo and EKG  History: Asthma, COPD, Echo  History Comments: 11/10 Echo EF= 60% / LVH & HOCM ,OSA/CPAP  Symptoms: Chest Pain, Chest Tightness, Chest Tightness with Exertion, DOE, Fatigue, Fatigue with Exertion, Rapid HR, SOB  Symptoms Comments: last chest pain one week ago.   Nuclear Pre-Procedure Cardiac Risk Factors: Family History - CAD, Hypertension Caffeine/Decaff Intake: None NPO After: 6:30 PM Lungs: Clear IV 0.9% NS with Angio Cath: 22g     IV Site: R Antecubital IV Started by: Irven Baltimore, RN Chest Size (in) 40     Cup Size B     Height (in): 63 Weight (lb): 189 BMI: 33.60 Tech Comments: Carvedilol this am per MD.  Nuclear Med Study 1 or 2 day study:  1 day     Stress Test Type:  Treadmill/Lexiscan Reading MD:  Jenkins Rouge, MD     Referring MD:  Jenkins Rouge Resting Radionuclide:  Technetium 31m Tetrofosmin     Resting Radionuclide Dose:  11 mCi  Stress Radionuclide:  Technetium 59m Tetrofosmin     Stress Radionuclide Dose:  33 mCi   Stress Protocol Exercise Time (min):  4:06 min     Max HR:  109 bpm     Predicted Max HR:  123456 bpm  Max Systolic BP: [id number] mm Hg     Percent Max HR:  70.78 %     METS: 4.8 Rate Pressure Product:  [id number]  Lexiscan: 0.4 mg   Stress Test Technologist:  Irven Baltimore,  RN     Nuclear Technologist:  Charlton Amor, CNMT  Rest Procedure  Myocardial perfusion imaging was performed at rest 45 minutes following the intravenous administration of Technetium 61m Tetrofosmin.  Stress Procedure  The patient attempted to walk treadmill utilizing the Bruce Protocol for 3 minutes, but was unable to reach target heartrate due to DOE, and bilateral thigh fatigue, RPE=15.The patient received IV Lexiscan 0.4 mg over 15-seconds with concurrent low level exercise and then Technetium 67m Tetrofosmin was injected at  30-seconds while the patient continued walking one more minute.  There were significant changes with Lexiscan, rare PAC.  Quantitative spect images were obtained after a 45 minute delay.  QPS Raw Data Images:  Normal; no motion artifact; normal heart/lung ratio. Stress Images:  Normal homogeneous uptake in all areas of the myocardium. Rest Images:  Normal homogeneous uptake in all areas of the myocardium. Subtraction (SDS):  Normal Transient Ischemic Dilatation:  0.96  (Normal <1.22)  Lung/Heart Ratio:  0.27  (Normal <0.45)  Quantitative Gated Spect Images QGS EDV:  76 ml QGS ESV:  20 ml QGS EF:  73 % QGS cine images:  normal  Findings Normal nuclear study      Overall Impression  Exercise Capacity: Lexiscan with low level exercise BP Response: Normal blood pressure response. Clinical Symptoms: Dyspnea ECG Impression: LVH in limb leads Overall Impression: Normal stress nuclear study.  Appended Document: Cardiology Nuclear Testing normal nuclear study  Appended Document: Cardiology Nuclear Testing pt aware of results

## 2010-09-03 NOTE — Assessment & Plan Note (Signed)
Summary: F/U APPT...LSW.   History of Present Illness Visit Type: Follow-up Visit Primary GI MD: Verl Blalock MD FACP [place] Primary Provider: Scarlette Calico, MD Requesting Provider: na Chief Complaint: F/u for diarrhea. Pt states that she is feeling better and denies any GI complaints  History of Present Illness:   70 year old Caucasian female with post cholecystectomy rapid intestinal transit type diarrhea and has partially responded to Colestid 1 g a day. She continues with some loose stools and abdominal cramping and has not played or colonoscopy is suggested. Otherwise she seems doing well and is on multiple medications are listed and reviewed. Bowel obstruction workup otherwise has been negative.   GI Review of Systems      Denies abdominal pain, acid reflux, belching, bloating, chest pain, dysphagia with liquids, dysphagia with solids, heartburn, loss of appetite, nausea, vomiting, vomiting blood, weight loss, and  weight gain.        Denies anal fissure, black tarry stools, change in bowel habit, constipation, diarrhea, diverticulosis, fecal incontinence, heme positive stool, hemorrhoids, irritable bowel syndrome, jaundice, light color stool, liver problems, rectal bleeding, and  rectal pain.    Current Medications (verified): 1)  Zantac 300 Mg  Tabs (Ranitidine Hcl) .... Take 1 Tablet By Mouth Once A Day 2)  Spironolactone-Hctz 25-25 Mg  Tabs (Spironolactone-Hctz) .... Take 1  Tab By Mouth Daily 3)  Allegra 180 Mg Tabs (Fexofenadine Hcl) .... Take 1 Tablet By Mouth Once A Day As Needed 4)  Cpap 5-15 Auto Titrate 5)  Luxiq 0.12 % Foam (Betamethasone Valerate) .... Apply To Scalp As Directed 6)  Vitamin D 2000 Unit Tabs (Cholecalciferol) .... Take 1 Tablet By Mouth Once A Day 7)  Lexapro 10 Mg Tabs (Escitalopram Oxalate) .... Take 1 Tablet By Mouth Once A Day 8)  Tekturna 150 Mg Tabs (Aliskiren Fumarate) .... One By Mouth Once Daily 9)  Mega Basic  Tabs (Multiple  Vitamins-Minerals) .... Take 1 Tablet By Mouth Once A Day 10)  Colestipol Hcl 1 Gm Tabs (Colestipol Hcl) .Marland Kitchen.. 1 Tab By Mouth Mid Morning, May Increase To Two Times A Day If No Improvement in Diarrhea.  Allergies (verified): 1)  ! Sulfa 2)  ! Nsaids 3)  ! Azor  Past History:  Past medical, surgical, family and social histories (including risk factors) reviewed for relevance to current acute and chronic problems.  Past Medical History: Reviewed history from 08/10/2008 and no changes required. Allergic Rhinitis Hypertension Allergic rhinitis COPD GERD Renal insufficiency Heart murmur Psoriasis  Past Surgical History: Reviewed history from 12/20/2009 and no changes required. Appendectomy Cholecystectomy Hysterectomy Tonsillectomy C-Section  Family History: Reviewed history from 12/20/2009 and no changes required. Dementia Family History Breast cancer 1st degree relative <50 Family History Hypertension No FH of Colon Cancer: Family History of Ovarian Cancer: Mat Aunt Family History of Bladder Cancer: Father Family History of Heart Disease: MGF, PGF  Social History: Reviewed history from 12/20/2009 and no changes required. Married Never Smoked Alcohol use-no Drug use-no Regular exercise-no Daily Caffeine Use  Review of Systems  The patient denies allergy/sinus, anemia, anxiety-new, arthritis/joint pain, back pain, blood in urine, breast changes/lumps, change in vision, confusion, cough, coughing up blood, depression-new, fainting, fatigue, fever, headaches-new, hearing problems, heart murmur, heart rhythm changes, itching, menstrual pain, muscle pains/cramps, night sweats, nosebleeds, pregnancy symptoms, shortness of breath, skin rash, sleeping problems, sore throat, swelling of feet/legs, swollen lymph glands, thirst - excessive , urination - excessive , urination changes/pain, urine leakage, vision changes, and voice change.  Vital Signs:  Patient profile:   70  year old female Height:      63 inches Weight:      201 pounds BMI:     35.73 BSA:     1.94 Pulse rate:   86 / minute Pulse rhythm:   regular BP sitting:   128 / 64  (left arm) Cuff size:   regular  Vitals Entered By: Hope Pigeon CMA (January 10, 2010 11:06 AM)  Physical Exam  General:  Well developed, well nourished, no acute distress.healthy appearing.   Head:  Normocephalic and atraumatic. Eyes:  PERRLA, no icterus.exam deferred to patient's ophthalmologist.   Psych:  Alert and cooperative. Normal mood and affect.   Impression & Recommendations:  Problem # 1:  DIARRHEA (ICD-787.91) Assessment Improved We talked at length about colonoscopy and alternative methods of bowel preparation. She repeatedly refuses a balanced electrolyte solution preparation. Review of her records and her chronic renal situation contraindicates fleets Phospho-Soda pill preparation which causes renal damage. I will prep her with magnesium citrate and hopefully enema preparation. In the interim, she is to increase her Colestid to 2 g a day as tolerated. Orders: Colonoscopy (Colon)  Problem # 2:  DEPRESSIVE DISORDER (ICD-311) Assessment: Improved continue current medications per Dr.Jones  Patient Instructions: 1)  Increase Colestid to two times a day. 2)  You are scheduled for a colonoscopy. 3)  The medication list was reviewed and reconciled.  All changed / newly prescribed medications were explained.  A complete medication list was provided to the patient / caregiver.

## 2010-09-03 NOTE — Assessment & Plan Note (Signed)
Summary: NP3/ABN ECHO/JML   Referring Provider:  na Primary Provider:  Scarlette Calico, MD  CC:  tightness and sob..pt recently seen Dr. Ronnald Ramp pt has been doing ok since then.  History of Present Illness: Holly Simmons is seen today at the request of Dr Ronnald Ramp.  She has had recent URI with bronchitis.  She has had a cough and atypical SSCP.  She has an echo 1/10 with signs of LVH and LVOT gradient.  This was not addressed or Rxed at the time.  She has mild CRF and sees Dr Clover Mealy.  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  She has recently been placed on a diuretic for edema.  She thinks she has had increased bruising in the left arm since then.  She denies syncope but does occasionally get dizzy with change in position.  Pain in chest improving with less cough.  No fever.  Denies palpitatons.    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  Should not be on a diuretic as this will worsen LVOT gardient.  Low sodium diet ok.  Should be on BB or Calcium blocker.  Given SSCP and HOCM physiology should have exercise myovue.  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) 7)  Asthma Nos W/acute Exacerbation  (ICD-493.92) 8)  Hypersomnia  (ICD-780.54) 9)  Routine General Medical Exam@[facility]  (ICD-V70.0) 10)  Depressive Disorder  (123456) 11)  Diastolic Dysfunction  (123XX123) 12)  Pyuria  (ICD-791.9) 13)  Abnormal Electrocardiogram  (ICD-794.31) 14)  Paresthesia  (ICD-782.0) 15)  Family History Breast Cancer 1st Degree Relative <50  (ICD-V16.3) 16)  Renal Insufficiency  (ICD-588.9) 17)  Gerd  (ICD-530.81) 18)  COPD  (ICD-496) 19)  Obstructive Sleep Apnea  (ICD-327.23) 20)  Allergic Rhinitis  (ICD-477.9)  Current Medications  (verified): 1)  Zantac 300 Mg  Tabs (Ranitidine Hcl) .... Take 1 Tablet By Mouth Once A Day 2)  Allegra 180 Mg Tabs (Fexofenadine Hcl) .... Take 1 Tablet By Mouth Once A Day As Needed 3)  Cpap 5-15 Auto Titrate 4)  Luxiq 0.12 % Foam (Betamethasone Valerate) .... Apply To Scalp As Directed 5)  Vitamin D 2000 Unit Tabs (Cholecalciferol) .... Take 1 Tablet By Mouth Once A Day 6)  Lexapro 10 Mg Tabs (Escitalopram Oxalate) .... Take 1 Tablet By Mouth Once A Day 7)  Mega Basic  Tabs (Multiple Vitamins-Minerals) .... Take 1 Tablet By Mouth Once A Day 8)  Symbicort 160-4.5 Mcg/act Aero (Budesonide-Formoterol Fumarate) .... 2 Puffs Two Times A Day For Asthma 9)  Tekturna 150 Mg Tabs (Aliskiren Fumarate) .Marland Kitchen.. 1 Once Daily 10)  Carvedilol 3.125 Mg Tabs (Carvedilol) .Marland Kitchen.. 1 Two Times A Day  Allergies (verified): 1)  ! Sulfa 2)  ! Nsaids 3)  ! Azor  Past History:  Past Medical History: Last updated: 05/21/2010 CHEST PAIN  HEART MURMUR, SYSTOLIC  HYPERTENSION  HYPERPOTASSEMIA  ASTHMA NOS W/ACUTE EXACERBATION  HYPERSOMNIA  ROUTINE GENERAL MEDICAL EXAM@HEALTH  [facility]ARE [facility]  DEPRESSIVE DISORDER  DIASTOLIC DYSFUNCTION PYURIA  ABNORMAL ELECTROCARDIOGRAM  PARESTHESIA FAMILY HISTORY BREAST CANCER 1ST DEGREE RELATIVE <50  RENAL INSUFFICIENCY GERD COPD OBSTRUCTIVE SLEEP APNEA  ALLERGIC RHINITIS Renal insufficiency Psoriasis  Past Surgical History: Last updated: 12/20/2009 Appendectomy Cholecystectomy Hysterectomy Tonsillectomy C-Section  Family History: Last updated: 12/20/2009 Dementia  Family History Breast cancer 1st degree relative <50 Family History Hypertension No FH of Colon Cancer: Family History of Ovarian Cancer: Mat Aunt Family History of Bladder Cancer: Father Family History of Heart Disease: MGF, PGF  Social History: Last updated: 12/20/2009 Married Never Smoked Alcohol use-no Drug use-no Regular exercise-no Daily Caffeine Use  Review of Systems       Denies  fever, malais, weight loss, blurry vision, decreased visual acuity, cough, sputum, SOB, hemoptysis, pleuritic pain, palpitaitons, heartburn, abdominal pain, melena, lower extremity edema, claudication, or rash.   Vital Signs:  Patient profile:   70 year old female Menstrual status:  hysterectomy Height:      63 inches Weight:      185 pounds BMI:     32.89 Pulse rate:   70 / minute Resp:     14 per minute BP sitting:   118 / 58  (left arm)  Vitals Entered By: Burnett Kanaris (May 22, 2010 9:17 AM)  Physical Exam  General:  Affect appropriate Healthy:  appears stated age HEENT: normal Neck supple with no adenopathy JVP normal no bruits no thyromegaly Lungs clear with no wheezing and good diaphragmatic motion Heart:  [id number] systolic murmur increases with valvsalva no ,rub, gallop or click PMI normal Abdomen: benighn, BS positve, no tenderness, no AAA no bruit.  No HSM or HJR Distal pulses intact with no bruits No edema Neuro non-focal Skin warm and dry    Impression & Recommendations:  Problem # 1:  CARDIOMYOPATHY, HYPERTROPHIC (ICD-425.1) Start BB coreg 3.125 two times a day and F/U echo 4 weeks to assess LVOT gradient.  Will likely need highter dose.  Stop diuretic Her updated medication list for this problem includes:    Carvedilol 3.125 Mg Tabs (Carvedilol) .Marland Kitchen... 1 two times a day  Orders: Echocardiogram (Echo)  Problem # 2:  CHEST PAIN (ICD-786.50) Atypical with LVH on ECG.  F/U stress myovue.  Exercise response also important to assess with HOCM.  Should take coreg day of exercise Her updated medication list for this problem includes:    Carvedilol 3.125 Mg Tabs (Carvedilol) .Marland Kitchen... 1 two times a day  Orders: Nuclear Stress Test (Nuc Stress Test)  Her updated medication list for this problem includes:    Carvedilol 3.125 Mg Tabs (Carvedilol) .Marland Kitchen... 1 two times a day  Problem # 3:  HYPERTENSION (ICD-401.9)  Continue tekturnal for protein sparing effect  but stop diuretic Her updated medication list for this problem includes:    Tekturna 150 Mg Tabs (Aliskiren fumarate) .Marland Kitchen... 1 once daily    Carvedilol 3.125 Mg Tabs (Carvedilol) .Marland Kitchen... 1 two times a day  Her updated medication list for this problem includes:    Tekturna 150 Mg Tabs (Aliskiren fumarate) .Marland Kitchen... 1 once daily    Carvedilol 3.125 Mg Tabs (Carvedilol) .Marland Kitchen... 1 two times a day  Problem # 4:  ABNORMAL ELECTROCARDIOGRAM (ICD-794.31) LVH as documented on echo.  R/O CAD with myovue Her updated medication list for this problem includes:    Carvedilol 3.125 Mg Tabs (Carvedilol) .Marland Kitchen... 1 two times a day  Patient Instructions: 1)  Your physician recommends that you schedule a follow-up appointment in: [place] 2)  Your physician has recommended you make the following change in your medication: STOP TEKTURNA/HCT 3)  START CARVEDILOL 3.125 MG 1 two times a day 4)  TEKTURNA 150 MG 1 once daily 5)  Your physician has requested that you have an echocardiogram.  Echocardiography is a painless test that  uses sound waves to create images of your heart. It provides your doctor with information about the size and shape of your heart and how well your heart's chambers and valves are working.  This procedure takes approximately one hour. There are no restrictions for this procedure.4 WEEKS AFTER TAKING CARVEDILOL 6)  Your physician has requested that you have an exercise stress myoview.  For further information please visit [email or web address].  Please follow instruction sheet, as given. Prescriptions: CARVEDILOL 3.125 MG TABS (CARVEDILOL) 1 two times a day  #60 x 11   Entered by:   Devra Dopp, LPN   Authorized by:   Bosie Clos, MD, [facility]   Signed by:   Devra Dopp, LPN on 579FGE   Method used:   Electronically to        [facility] [id number] (retail)       [place], [place]  [address]       Ph: [phone]       Fax: [phone]    RxID:   [id number] TEKTURNA 150 MG TABS (ALISKIREN FUMARATE) 1 once daily  #30 x 11   Entered by:   Devra Dopp, LPN   Authorized by:   Bosie Clos, MD, [facility]   Signed by:   Devra Dopp, LPN on 579FGE   Method used:   Electronically to        [facility] [address] (retail)       [address]       [place], [place]  [address]       Ph: [phone]       Fax: [phone]   RxID:   [id number]

## 2010-09-03 NOTE — Assessment & Plan Note (Signed)
Summary: BRONCHITIS? /NWS   Vital Signs:  Patient profile:   70 year old female Height:      63 inches (160.02 cm) Weight:      194.50 pounds (88.41 kg) BMI:     34.58 O2 Sat:      96 % on Room air Temp:     98.7 degrees F (37.06 degrees C) oral Pulse rate:   89 / minute Pulse rhythm:   regular Resp:     16 per minute BP sitting:   122 / 68  (left arm) Cuff size:   large  Vitals Entered By: Rebeca Alert MA (March 20, 2010 3:17 PM)  Nutrition Counseling: Patient's BMI is greater than 25 and therefore counseled on weight management options.  O2 Flow:  Room air CC: bronchitis symptoms x 2days/aj, URI symptoms Is Patient Diabetic? No Pain Assessment Patient in pain? no        Primary Care Provider:  Scarlette Calico, MD  CC:  bronchitis symptoms x 2days/aj and URI symptoms.  History of Present Illness:  URI Symptoms      This is a 70 year old woman who presents with URI symptoms.  The symptoms began 4 days ago.  The severity is described as moderate.  The patient reports nasal congestion, purulent nasal discharge, sore throat, and productive cough, but denies clear nasal discharge, earache, and sick contacts.  The patient denies fever, stiff neck, dyspnea, wheezing, rash, vomiting, diarrhea, use of an antipyretic, and response to antipyretic.  The patient also reports severe fatigue.  The patient denies headache and muscle aches.  The patient denies the following risk factors for Strep sinusitis: unilateral facial pain, unilateral nasal discharge, poor response to decongestant, double sickening, tooth pain, Strep exposure, tender adenopathy, and absence of cough.    Preventive Screening-Counseling & Management  Alcohol-Tobacco     Alcohol drinks/day: 0     Smoking Status: never     Passive Smoke Exposure: no  Clinical Review Panels:  Prevention   Last Mammogram:  Normal Bilateral (08/23/2009)   Last Colonoscopy:  DONE (01/30/2010)  Immunizations   Last Flu Vaccine:   Fluvax 3+ (05/02/2009)   Last Pneumovax:  Pneumovax (08/10/2008)  Lipid Management   Cholesterol:  148 (10/31/2009)   LDL (bad choesterol):  81 (10/31/2009)   HDL (good cholesterol):  43.80 (10/31/2009)  Diabetes Management   Creatinine:  1.3 (10/31/2009)   Last Flu Vaccine:  Fluvax 3+ (05/02/2009)   Last Pneumovax:  Pneumovax (08/10/2008)  CBC   WBC:  6.0 (10/31/2009)   RBC:  4.34 (10/31/2009)   Hgb:  13.1 (10/31/2009)   Hct:  39.2 (10/31/2009)   Platelets:  174.0 (10/31/2009)   MCV  90.4 (10/31/2009)   MCHC  33.4 (10/31/2009)   RDW  12.3 (10/31/2009)   PMN:  73.9 (10/31/2009)   Lymphs:  14.6 (10/31/2009)   Monos:  8.1 (10/31/2009)   Eosinophils:  2.5 (10/31/2009)   Basophil:  0.9 (10/31/2009)  Complete Metabolic Panel   Glucose:  97 (10/31/2009)   Sodium:  137 (10/31/2009)   Potassium:  4.0 (10/31/2009)   Chloride:  99 (10/31/2009)   CO2:  29 (10/31/2009)   BUN:  26 (10/31/2009)   Creatinine:  1.3 (10/31/2009)   Albumin:  3.9 (10/31/2009)   Total Protein:  8.2 (10/31/2009)   Calcium:  9.7 (10/31/2009)   Total Bili:  0.9 (10/31/2009)   Alk Phos:  109 (10/31/2009)   SGPT (ALT):  22 (10/31/2009)   SGOT (AST):  24 (10/31/2009)  Medications Prior to Update: 1)  Zantac 300 Mg  Tabs (Ranitidine Hcl) .... Take 1 Tablet By Mouth Once A Day 2)  Spironolactone-Hctz 25-25 Mg  Tabs (Spironolactone-Hctz) .... Take 1  Tab By Mouth Daily 3)  Allegra 180 Mg Tabs (Fexofenadine Hcl) .... Take 1 Tablet By Mouth Once A Day As Needed 4)  Cpap 5-15 Auto Titrate 5)  Luxiq 0.12 % Foam (Betamethasone Valerate) .... Apply To Scalp As Directed 6)  Vitamin D 2000 Unit Tabs (Cholecalciferol) .... Take 1 Tablet By Mouth Once A Day 7)  Lexapro 10 Mg Tabs (Escitalopram Oxalate) .... Take 1 Tablet By Mouth Once A Day 8)  Tekturna 150 Mg Tabs (Aliskiren Fumarate) .... One By Mouth Once Daily 9)  Mega Basic  Tabs (Multiple Vitamins-Minerals) .... Take 1 Tablet By Mouth Once A Day 10)   Colestipol Hcl 1 Gm Tabs (Colestipol Hcl) .... Two Times A Day  Current Medications (verified): 1)  Zantac 300 Mg  Tabs (Ranitidine Hcl) .... Take 1 Tablet By Mouth Once A Day 2)  Spironolactone-Hctz 25-25 Mg  Tabs (Spironolactone-Hctz) .... Take 1  Tab By Mouth Daily 3)  Allegra 180 Mg Tabs (Fexofenadine Hcl) .... Take 1 Tablet By Mouth Once A Day As Needed 4)  Cpap 5-15 Auto Titrate 5)  Luxiq 0.12 % Foam (Betamethasone Valerate) .... Apply To Scalp As Directed 6)  Vitamin D 2000 Unit Tabs (Cholecalciferol) .... Take 1 Tablet By Mouth Once A Day 7)  Lexapro 10 Mg Tabs (Escitalopram Oxalate) .... Take 1 Tablet By Mouth Once A Day 8)  Tekturna 150 Mg Tabs (Aliskiren Fumarate) .... One By Mouth Once Daily 9)  Mega Basic  Tabs (Multiple Vitamins-Minerals) .... Take 1 Tablet By Mouth Once A Day 10)  Colestipol Hcl 1 Gm Tabs (Colestipol Hcl) .... Two Times A Day 11)  Avelox 400 Mg Tabs (Moxifloxacin Hcl) .... One By Mouth Once Daily For 5 Days  Allergies (verified): 1)  ! Sulfa 2)  ! Nsaids 3)  ! Azor  Past History:  Past Medical History: Last updated: 08/10/2008 Allergic Rhinitis Hypertension Allergic rhinitis COPD GERD Renal insufficiency Heart murmur Psoriasis  Past Surgical History: Last updated: 12/20/2009 Appendectomy Cholecystectomy Hysterectomy Tonsillectomy C-Section  Family History: Last updated: 12/20/2009 Dementia Family History Breast cancer 1st degree relative <50 Family History Hypertension No FH of Colon Cancer: Family History of Ovarian Cancer: Mat Aunt Family History of Bladder Cancer: Father Family History of Heart Disease: MGF, PGF  Social History: Last updated: 12/20/2009 Married Never Smoked Alcohol use-no Drug use-no Regular exercise-no Daily Caffeine Use  Risk Factors: Alcohol Use: 0 (03/20/2010) Exercise: no (08/10/2008)  Risk Factors: Smoking Status: never (03/20/2010) Passive Smoke Exposure: no (03/20/2010)  Family  History: Reviewed history from 12/20/2009 and no changes required. Dementia Family History Breast cancer 1st degree relative <50 Family History Hypertension No FH of Colon Cancer: Family History of Ovarian Cancer: Mat Aunt Family History of Bladder Cancer: Father Family History of Heart Disease: MGF, PGF  Social History: Reviewed history from 12/20/2009 and no changes required. Married Never Smoked Alcohol use-no Drug use-no Regular exercise-no Daily Caffeine Use  Review of Systems  The patient denies anorexia, fever, weight loss, decreased hearing, hoarseness, chest pain, syncope, peripheral edema, prolonged cough, headaches, hemoptysis, abdominal pain, hematuria, suspicious skin lesions, enlarged lymph nodes, and angioedema.    Physical Exam  General:  alert, well-developed, well-nourished, well-hydrated, and overweight-appearing.   Eyes:  vision grossly intact, pupils equal, pupils round, and pupils reactive to light.  Ears:  R ear normal and L ear normal.   Nose:  External nasal examination shows no deformity or inflammation. Nasal mucosa are pink and moist without lesions or exudates. Mouth:  no exudates, no posterior lymphoid hypertrophy, no postnasal drip, no pharyngeal crowing, no lesions, no aphthous ulcers, no erosions, no tongue abnormalities, and pharyngeal erythema.   Neck:  supple, full ROM, no masses, no thyromegaly, no JVD, normal carotid upstroke, no carotid bruits, no cervical lymphadenopathy, and no neck tenderness.   Lungs:  normal respiratory effort, no intercostal retractions, no accessory muscle use, normal breath sounds, no dullness, no fremitus, no crackles, and no wheezes.   Heart:  normal rate, regular rhythm, no gallop, no rub, no JVD, and Grade  1/6 systolic ejection murmur.   Abdomen:  soft, non-tender, normal bowel sounds, no distention, no masses, no guarding, no rigidity, no rebound tenderness, no abdominal hernia, no inguinal hernia, no  hepatomegaly, and no splenomegaly.   Msk:  No deformity or scoliosis noted of thoracic or lumbar spine.   Pulses:  R and L carotid,radial,femoral,dorsalis pedis and posterior tibial pulses are full and equal bilaterally Extremities:  No clubbing, cyanosis, edema, or deformity noted with normal full range of motion of all joints.   Neurologic:  No cranial nerve deficits noted. Station and gait are normal. Plantar reflexes are down-going bilaterally. DTRs are symmetrical throughout. Sensory, motor and coordinative functions appear intact. Skin:  turgor normal, color normal, no rashes, no suspicious lesions, no ecchymoses, no petechiae, and no purpura.   Cervical Nodes:  no anterior cervical adenopathy and no posterior cervical adenopathy.   Axillary Nodes:  no R axillary adenopathy and no L axillary adenopathy.   Inguinal Nodes:  no R inguinal adenopathy and no L inguinal adenopathy.   Psych:  Cognition and judgment appear intact. Alert and cooperative with normal attention span and concentration. No apparent delusions, illusions, hallucinations   Impression & Recommendations:  Problem # 1:  BRONCHITIS-ACUTE (ICD-466.0) Assessment New  Her updated medication list for this problem includes:    Avelox 400 Mg Tabs (Moxifloxacin hcl) ..... One by mouth once daily for 5 days  Problem # 2:  COUGH (ICD-786.2)  Orders: T-2 View CXR ([id number])  Problem # 3:  HYPERTENSION (ICD-401.9) Assessment: Improved  Her updated medication list for this problem includes:    Spironolactone-hctz 25-25 Mg Tabs (Spironolactone-hctz) .Marland Kitchen... Take 1  tab by mouth daily    Tekturna 150 Mg Tabs (Aliskiren fumarate) ..... One by mouth once daily  BP today: 122/68 Prior BP: 128/64 (01/10/2010)  Prior 10 Yr Risk Heart Disease: Not enough information (08/10/2008)  Labs Reviewed: K+: 4.0 (10/31/2009) Creat: : 1.3 (10/31/2009)   Chol: 148 (10/31/2009)   HDL: 43.80 (10/31/2009)   LDL: 81 (10/31/2009)   TG: 117.0  (10/31/2009)  Complete Medication List: 1)  Zantac 300 Mg Tabs (Ranitidine hcl) .... Take 1 tablet by mouth once a day 2)  Spironolactone-hctz 25-25 Mg Tabs (Spironolactone-hctz) .... Take 1  tab by mouth daily 3)  Allegra 180 Mg Tabs (Fexofenadine hcl) .... Take 1 tablet by mouth once a day as needed 4)  Cpap 5-15 Auto Titrate  5)  Luxiq 0.12 % Foam (Betamethasone valerate) .... Apply to scalp as directed 6)  Vitamin D 2000 Unit Tabs (Cholecalciferol) .... Take 1 tablet by mouth once a day 7)  Lexapro 10 Mg Tabs (Escitalopram oxalate) .... Take 1 tablet by mouth once a day 8)  Tekturna 150 Mg Tabs (Aliskiren fumarate) .... One by  mouth once daily 9)  Mega Basic Tabs (Multiple vitamins-minerals) .... Take 1 tablet by mouth once a day 10)  Colestipol Hcl 1 Gm Tabs (Colestipol hcl) .... Two times a day 11)  Avelox 400 Mg Tabs (Moxifloxacin hcl) .... One by mouth once daily for 5 days  Patient Instructions: 1)  Please schedule a follow-up appointment in 2 weeks. 2)  Take your antibiotic as prescribed until ALL of it is gone, but stop if you develop a rash or swelling and contact our office as soon as possible. 3)  Acute bronchitis symptoms for less than 10 days are not helped by antibiotics. take over the counter cough medications. call if no improvment in  5-7 days, sooner if increasing cough, fever, or new symptoms( shortness of breath, chest pain). Prescriptions: LEXAPRO 10 MG TABS (ESCITALOPRAM OXALATE) Take 1 tablet by mouth once a day  #30 x 11   Entered and Authorized by:   Janith Lima MD   Signed by:   Janith Lima MD on 03/20/2010   Method used:   Electronically to        [facility] [id number] (retail)       [place], [place]  [address]       Ph: [phone]       Fax: [phone]   [id number] AVELOX 400 MG TABS (MOXIFLOXACIN HCL) One by mouth once daily for 5 days  #5 x 0   Entered and Authorized by:   Janith Lima MD    Signed by:   Janith Lima MD on 03/20/2010   Method used:   Samples Given   RxID:   [id number]

## 2010-09-03 NOTE — Progress Notes (Signed)
Summary: COUGH  Phone Note Call from Patient   Caller: Husband [phone] Summary of Call: Pt husband stopped by office. Pt c/o severe cough that has not improved. This is causing back and rib cage pain, loosing sleep due to cough. Also c/o wheezing. She is taking allergy med and using inhaler. No fever and cough is not productive.   Pt is currently in [place] and will be back Sunday. Advised she go to ER or UC if wheezing/breathing was becoming severe. Per husband, main problem is cough.   RX needs to go to [facility] on [address], they will have this transferred to [place].  Pt is req rx for to help with cough that will not cause daytime drowsiness. Please advise.  Initial call taken by: Charlsie Quest, [place],  April 16, 2010 4:02 PM

## 2010-09-03 NOTE — Assessment & Plan Note (Signed)
Summary: 2 month follow up-lb   Vital Signs:  Patient profile:   70 year old female Height:      63 inches Weight:      200 pounds BMI:     35.56 O2 Sat:      96 % on Room air Temp:     97.7 degrees F oral Pulse rate:   85 / minute Pulse rhythm:   regular Resp:     16 per minute BP sitting:   124 / 60  (left arm) Cuff size:   large  Vitals Entered By: Estell Harpin CMA (January 02, 2010 1:09 PM)  Nutrition Counseling: Patient's BMI is greater than 25 and therefore counseled on weight management options.  O2 Flow:  Room air CC: follow-up visit Is Patient Diabetic? No   Primary Care Provider:  Scarlette Calico, MD  CC:  follow-up visit.  History of Present Illness:  Hypertension Follow-Up      This is a 70 year old woman who presents for Hypertension follow-up.  The patient denies lightheadedness, urinary frequency, headaches, edema, rash, and fatigue.  The patient denies the following associated symptoms: chest pain, chest pressure, exercise intolerance, dyspnea, palpitations, syncope, leg edema, and pedal edema.  Compliance with medications (by patient report) has been near 100%.  The patient reports that dietary compliance has been excellent.  The patient reports exercising occasionally.  Adjunctive measures currently used by the patient include salt restriction and relaxation.    Preventive Screening-Counseling & Management  Alcohol-Tobacco     Alcohol drinks/day: 0     Smoking Status: never     Passive Smoke Exposure: no  Hep-HIV-STD-Contraception     Hepatitis Risk: no risk noted     HIV Risk: no     STD Risk: no risk noted     SBE monthly: yes     SBE Education/Counseling: to perform regular SBE      Sexual History:  currently monogamous.        Drug Use:  never.        Blood Transfusions:  no.    Clinical Review Panels:  Diabetes Management   Creatinine:  1.3 (10/31/2009)   Last Flu Vaccine:  Fluvax 3+ (05/02/2009)   Last Pneumovax:  Pneumovax  (08/10/2008)  CBC   WBC:  6.0 (10/31/2009)   RBC:  4.34 (10/31/2009)   Hgb:  13.1 (10/31/2009)   Hct:  39.2 (10/31/2009)   Platelets:  174.0 (10/31/2009)   MCV  90.4 (10/31/2009)   MCHC  33.4 (10/31/2009)   RDW  12.3 (10/31/2009)   PMN:  73.9 (10/31/2009)   Lymphs:  14.6 (10/31/2009)   Monos:  8.1 (10/31/2009)   Eosinophils:  2.5 (10/31/2009)   Basophil:  0.9 (10/31/2009)  Complete Metabolic Panel   Glucose:  97 (10/31/2009)   Sodium:  137 (10/31/2009)   Potassium:  4.0 (10/31/2009)   Chloride:  99 (10/31/2009)   CO2:  29 (10/31/2009)   BUN:  26 (10/31/2009)   Creatinine:  1.3 (10/31/2009)   Albumin:  3.9 (10/31/2009)   Total Protein:  8.2 (10/31/2009)   Calcium:  9.7 (10/31/2009)   Total Bili:  0.9 (10/31/2009)   Alk Phos:  109 (10/31/2009)   SGPT (ALT):  22 (10/31/2009)   SGOT (AST):  24 (10/31/2009)   Problems Prior to Update: 1)  Diarrhea  (ICD-787.91) 2)  Hypersomnia  (ICD-780.54) 3)  Routine General Medical Exam@[facility]  (ICD-V70.0) 4)  Depressive Disorder  (123456) 5)  Diastolic Dysfunction  (  ICD-429.9) 6)  Pyuria  (ICD-791.9) 7)  Abnormal Electrocardiogram  (ICD-794.31) 8)  Heart Murmur, Systolic  ([id number]) 9)  Paresthesia  (ICD-782.0) 10)  Family History Breast Cancer 1st Degree Relative <50  (ICD-V16.3) 11)  Renal Insufficiency  (ICD-588.9) 12)  Gerd  (ICD-530.81) 13)  COPD  (ICD-496) 14)  Obstructive Sleep Apnea  (ICD-327.23) 15)  Sleepiness  (ICD-780.09) 16)  Hypertension  (ICD-401.9) 17)  Allergic Rhinitis  (ICD-477.9)  Medications Prior to Update: 1)  Zantac 300 Mg  Tabs (Ranitidine Hcl) .... Take 1 Tablet By Mouth Once A Day 2)  Spironolactone-Hctz 25-25 Mg  Tabs (Spironolactone-Hctz) .... Take 1  Tab By Mouth Daily 3)  Allegra 180 Mg Tabs (Fexofenadine Hcl) .... Take 1 Tablet By Mouth Once A Day As Needed 4)  Cpap 5-15 Auto Titrate 5)  Luxiq 0.12 % Foam (Betamethasone Valerate) .... Apply To Scalp As Directed 6)  Vitamin D 2000  Unit Tabs (Cholecalciferol) .... Take 1 Tablet By Mouth Once A Day 7)  Lexapro 10 Mg Tabs (Escitalopram Oxalate) .... Take 1 Tablet By Mouth Once A Day 8)  Tekturna 150 Mg Tabs (Aliskiren Fumarate) .... One By Mouth Once Daily 9)  Mega Basic  Tabs (Multiple Vitamins-Minerals) .... Take 1 Tablet By Mouth Once A Day 10)  Colestipol Hcl 1 Gm Tabs (Colestipol Hcl) .Marland Kitchen.. 1 Tab By Mouth Mid Morning, May Increase To Two Times A Day If No Improvement in Diarrhea.  Current Medications (verified): 1)  Zantac 300 Mg  Tabs (Ranitidine Hcl) .... Take 1 Tablet By Mouth Once A Day 2)  Spironolactone-Hctz 25-25 Mg  Tabs (Spironolactone-Hctz) .... Take 1  Tab By Mouth Daily 3)  Allegra 180 Mg Tabs (Fexofenadine Hcl) .... Take 1 Tablet By Mouth Once A Day As Needed 4)  Cpap 5-15 Auto Titrate 5)  Luxiq 0.12 % Foam (Betamethasone Valerate) .... Apply To Scalp As Directed 6)  Vitamin D 2000 Unit Tabs (Cholecalciferol) .... Take 1 Tablet By Mouth Once A Day 7)  Lexapro 10 Mg Tabs (Escitalopram Oxalate) .... Take 1 Tablet By Mouth Once A Day 8)  Tekturna 150 Mg Tabs (Aliskiren Fumarate) .... One By Mouth Once Daily 9)  Mega Basic  Tabs (Multiple Vitamins-Minerals) .... Take 1 Tablet By Mouth Once A Day 10)  Colestipol Hcl 1 Gm Tabs (Colestipol Hcl) .Marland Kitchen.. 1 Tab By Mouth Mid Morning, May Increase To Two Times A Day If No Improvement in Diarrhea.  Allergies (verified): 1)  ! Sulfa 2)  ! Nsaids 3)  ! Azor  Past History:  Past Medical History: Last updated: 08/10/2008 Allergic Rhinitis Hypertension Allergic rhinitis COPD GERD Renal insufficiency Heart murmur Psoriasis  Past Surgical History: Last updated: 12/20/2009 Appendectomy Cholecystectomy Hysterectomy Tonsillectomy C-Section  Family History: Last updated: 12/20/2009 Dementia Family History Breast cancer 1st degree relative <50 Family History Hypertension No FH of Colon Cancer: Family History of Ovarian Cancer: Mat Aunt Family History of  Bladder Cancer: Father Family History of Heart Disease: MGF, PGF  Social History: Last updated: 12/20/2009 Married Never Smoked Alcohol use-no Drug use-no Regular exercise-no Daily Caffeine Use  Risk Factors: Alcohol Use: 0 (01/02/2010) Exercise: no (08/10/2008)  Risk Factors: Smoking Status: never (01/02/2010) Passive Smoke Exposure: no (01/02/2010)  Family History: Reviewed history from 12/20/2009 and no changes required. Dementia Family History Breast cancer 1st degree relative <50 Family History Hypertension No FH of Colon Cancer: Family History of Ovarian Cancer: Mat Aunt Family History of Bladder Cancer: Father Family History of Heart Disease: MGF, PGF  Social  History: Reviewed history from 12/20/2009 and no changes required. Married Never Smoked Alcohol use-no Drug use-no Regular exercise-no Daily Caffeine Use  Review of Systems       The patient complains of weight gain.  The patient denies anorexia, chest pain, syncope, dyspnea on exertion, prolonged cough, headaches, hemoptysis, abdominal pain, melena, hematochezia, severe indigestion/heartburn, hematuria, enlarged lymph nodes, and angioedema.   GU:  Denies dysuria, hematuria, urinary frequency, and urinary hesitancy.  Physical Exam  General:  alert, well-developed, well-nourished, well-hydrated, and overweight-appearing.   Head:  normocephalic, atraumatic, no abnormalities observed, and no abnormalities palpated.   Mouth:  Oral mucosa and oropharynx without lesions or exudates.  Teeth in good repair. Neck:  supple, full ROM, no masses, and no thyromegaly.   Lungs:  Normal respiratory effort, chest expands symmetrically. Lungs are clear to auscultation, no crackles or wheezes. Heart:  normal rate and Grade  1/6 systolic ejection murmur, heard best in RUSB, but also heard along LLSB.  normal rate, no gallop, no rub, no JVD, no heaves. Abdomen:  soft, non-tender, normal bowel sounds, no hepatomegaly, and no  splenomegaly.  no distention, no masses, no guarding, no rigidity, and abdominal scar(s).   Msk:  No deformity or scoliosis noted of thoracic or lumbar spine.   Pulses:  R and L carotid,radial,femoral,dorsalis pedis and posterior tibial pulses are full and equal bilaterally Extremities:  No clubbing, cyanosis, edema, or deformity noted with normal full range of motion of all joints.   Neurologic:  No cranial nerve deficits noted. Station and gait are normal. Plantar reflexes are down-going bilaterally. DTRs are symmetrical throughout. Sensory, motor and coordinative functions appear intact. Skin:  turgor normal, color normal, no rashes, no suspicious lesions, no ecchymoses, no petechiae, no purpura, no ulcerations, no edema, solar damage, and seborrheic keratosis.   Cervical Nodes:  no anterior cervical adenopathy and no posterior cervical adenopathy.   Psych:  Cognition and judgment appear intact. Alert and cooperative with normal attention span and concentration. No apparent delusions, illusions, hallucinations   Impression & Recommendations:  Problem # 1:  HYPERTENSION (ICD-401.9) Assessment Improved  Her updated medication list for this problem includes:    Spironolactone-hctz 25-25 Mg Tabs (Spironolactone-hctz) .Marland Kitchen... Take 1  tab by mouth daily    Tekturna 150 Mg Tabs (Aliskiren fumarate) ..... One by mouth once daily  BP today: 124/60 Prior BP: 100/56 (12/20/2009)  Prior 10 Yr Risk Heart Disease: Not enough information (08/10/2008)  Labs Reviewed: K+: 4.0 (10/31/2009) Creat: : 1.3 (10/31/2009)   Chol: 148 (10/31/2009)   HDL: 43.80 (10/31/2009)   LDL: 81 (10/31/2009)   TG: 117.0 (10/31/2009)  Problem # 2:  DIASTOLIC DYSFUNCTION (0000000.9) Assessment: Improved  Problem # 3:  RENAL INSUFFICIENCY (ICD-588.9) Assessment: Improved  Problem # 4:  GERD (ICD-530.81) Assessment: Improved  Her updated medication list for this problem includes:    Zantac 300 Mg Tabs (Ranitidine hcl)  .Marland Kitchen... Take 1 tablet by mouth once a day  Labs Reviewed: Hgb: 13.1 (10/31/2009)   Hct: 39.2 (10/31/2009)  Complete Medication List: 1)  Zantac 300 Mg Tabs (Ranitidine hcl) .... Take 1 tablet by mouth once a day 2)  Spironolactone-hctz 25-25 Mg Tabs (Spironolactone-hctz) .... Take 1  tab by mouth daily 3)  Allegra 180 Mg Tabs (Fexofenadine hcl) .... Take 1 tablet by mouth once a day as needed 4)  Cpap 5-15 Auto Titrate  5)  Luxiq 0.12 % Foam (Betamethasone valerate) .... Apply to scalp as directed 6)  Vitamin D 2000 Unit Tabs (  Cholecalciferol) .... Take 1 tablet by mouth once a day 7)  Lexapro 10 Mg Tabs (Escitalopram oxalate) .... Take 1 tablet by mouth once a day 8)  Tekturna 150 Mg Tabs (Aliskiren fumarate) .... One by mouth once daily 9)  Mega Basic Tabs (Multiple vitamins-minerals) .... Take 1 tablet by mouth once a day 10)  Colestipol Hcl 1 Gm Tabs (Colestipol hcl) .Marland Kitchen.. 1 tab by mouth mid morning, may increase to two times a day if no improvement in diarrhea.  Patient Instructions: 1)  Please schedule a follow-up appointment in 6 months. 2)  It is important that you exercise regularly at least 20 minutes 5 times a week. If you develop chest pain, have severe difficulty breathing, or feel very tired , stop exercising immediately and seek medical attention. 3)  You need to lose weight. Consider a lower calorie diet and regular exercise.  4)  Schedule a colonoscopy/sigmoidoscopy to help detect colon cancer. 5)  Check your Blood Pressure regularly. If it is above 130/80: you should make an appointment. Prescriptions: TEKTURNA 150 MG TABS (ALISKIREN FUMARATE) One by mouth once daily  #84 x 0   Entered and Authorized by:   Janith Lima MD   Signed by:   Janith Lima MD on 01/02/2010   Method used:   Samples Given   RxID:   [id number]

## 2010-09-03 NOTE — Letter (Signed)
Summary: Results Follow-up Letter  [facility] [address]   [place], [place] [address]   Phone: [phone]  Fax: [phone]    04/24/2010  [place], [place]  [address]  Dear Ms. Karczewski,   The following are the results of your recent test(s):  Test     Result     Potassium level   slightly high Kidney     slight decline in function   _________________________________________________________  Please call for an appointment in 2-3 weeks for a potassium recheck _________________________________________________________ _________________________________________________________ _________________________________________________________  Sincerely,  Scarlette Calico MD [facility]

## 2010-09-03 NOTE — Procedures (Signed)
Summary: Colonoscopy  Patient: [name] Note: All result statuses are Final unless otherwise noted.  Tests: (1) Colonoscopy (COL)   COL Colonoscopy           [facility] [employer] [facility] [employer]     [place], [place]  [address]           COLONOSCOPY PROCEDURE REPORT           PATIENT:  Holly Simmons, Holly Simmons  MR#:  [id number]     BIRTHDATE:  February 28, 1941, 61 yrs. old  GENDER:  female     ENDOSCOPIST:  Loralee Pacas. Sharlett Iles, MD, [facility]     REF. BY:     PROCEDURE DATE:  01/30/2010     PROCEDURE:  Colonoscopy with biopsy and snare polypectomy     ASA CLASS:  Class II     INDICATIONS:  Colorectal cancer screening, average risk,     unexplained diarrhea     MEDICATIONS:   Fentanyl 75 mcg IV, Versed 7 mg IV           DESCRIPTION OF PROCEDURE:   After the risks benefits and     alternatives of the procedure were thoroughly explained, informed     consent was obtained.  Digital rectal exam was performed and     revealed no abnormalities.   The LB CF-[id number] endoscope     was introduced through the anus and advanced to the cecum, which     was identified by both the appendix and ileocecal valve, limited     by a redundant colon, poor preparation.    The quality of the prep     was poor, using MoviPrep.  The instrument was then slowly     withdrawn as the colon was fully examined.     <<PROCEDUREIMAGES>>           FINDINGS:  ULTRASONIC FINDINGS:  There were multiple polyps     identified and removed. in the cecum. -8 mm flat polyps cold and     hot snare excised. A sessile polyp was found in the sigmoid colon.     47mm flat adenoma snare excised.random colon biopsies done.  This     was otherwise a normal examination of the colon.   Retroflexed     views in the rectum revealed no abnormalities.    The scope was     then withdrawn from the patient and the procedure completed.           COMPLICATIONS:  None     ENDOSCOPIC IMPRESSION:     1) Polyps, multiple in the  cecum     2) Sessile polyp in the sigmoid colon     3) Otherwise normal examination     1.r/o adenomas.multiple     2.r/o microscopic/collagenous colitis.     RECOMMENDATIONS:     1) Await biopsy results     2) Continue current medications     3y F/U     REPEAT EXAM:  No           ______________________________     Loralee Pacas. Sharlett Iles, MD, [facility]           CC:           n.     eSIGNED:   Loralee Pacas. Patterson at 01/30/2010 02:14 PM           Judge Stall, [id number]  Note: An exclamation mark Marland Kitchen)  indicates a result that was not dispersed into the flowsheet. Document Creation Date: 01/30/2010 2:14 PM _______________________________________________________________________  (1) Order result status: Final Collection or observation date-time: 01/30/2010 14:05 Requested date-time:  Receipt date-time:  Reported date-time:  Referring Physician:   Ordering Physician: Verl Blalock [phone]) Specimen Source:  Source: Tawanna Cooler Order Number: [id number] Lab site:   Appended Document: Colonoscopy     Procedures Next Due Date:    Colonoscopy: 01/2013

## 2010-09-03 NOTE — Assessment & Plan Note (Signed)
Summary: ?? shingles   Vital Signs:  Patient profile:   70 year old female Menstrual status:  hysterectomy Height:      63 inches Weight:      196 pounds BMI:     34.85 O2 Sat:      97 % on Room air Temp:     97.8 degrees F oral Pulse rate:   69 / minute Pulse rhythm:   regular Resp:     16 per minute BP sitting:   110 / 62  (left arm) Cuff size:   large  Vitals Entered By: Estell Harpin CMA (May 21, 2010 10:36 AM)  Nutrition Counseling: Patient's BMI is greater than 25 and therefore counseled on weight management options.  O2 Flow:  Room air CC: Patient c/o itchy rash on Left side wrist since starting new med, Hypertension Management Is Patient Diabetic? No Pain Assessment Patient in pain? no       Does patient need assistance? Functional Status Self care Ambulation Normal     Menstrual Status hysterectomy   Primary Care Provider:  Scarlette Calico, MD  CC:  Patient c/o itchy rash on Left side wrist since starting new med and Hypertension Management.  History of Present Illness: She returns c/o an area on the dorsum of her left forearm that has some dry/itchy skin and now some small bruises.  Hypertension History:      She denies headache, chest pain, palpitations, dyspnea with exertion, orthopnea, PND, peripheral edema, visual symptoms, neurologic problems, syncope, and side effects from treatment.  She notes no problems with any antihypertensive medication side effects.        Positive major cardiovascular risk factors include female age 51 years old or older and hypertension.  Negative major cardiovascular risk factors include no history of diabetes or hyperlipidemia, negative family history for ischemic heart disease, and non-tobacco-user status.        Positive history for target organ damage include hypertensive retinopathy.  Further assessment for target organ damage reveals no history of ASHD, cardiac end-organ damage (CHF/LVH), stroke/TIA, peripheral  vascular disease, or renal insufficiency.     Allergies: 1)  ! Sulfa 2)  ! Nsaids 3)  ! Azor  Past History:  Past Medical History: Last updated: 08/10/2008 Allergic Rhinitis Hypertension Allergic rhinitis COPD GERD Renal insufficiency Heart murmur Psoriasis  Past Surgical History: Last updated: 12/20/2009 Appendectomy Cholecystectomy Hysterectomy Tonsillectomy C-Section  Family History: Last updated: 12/20/2009 Dementia Family History Breast cancer 1st degree relative <50 Family History Hypertension No FH of Colon Cancer: Family History of Ovarian Cancer: Mat Aunt Family History of Bladder Cancer: Father Family History of Heart Disease: MGF, PGF  Social History: Last updated: 12/20/2009 Married Never Smoked Alcohol use-no Drug use-no Regular exercise-no Daily Caffeine Use  Risk Factors: Alcohol Use: 0 (05/15/2010) Exercise: no (08/10/2008)  Risk Factors: Smoking Status: never (05/15/2010) Passive Smoke Exposure: no (05/15/2010)  Family History: Reviewed history from 12/20/2009 and no changes required. Dementia Family History Breast cancer 1st degree relative <50 Family History Hypertension No FH of Colon Cancer: Family History of Ovarian Cancer: Mat Aunt Family History of Bladder Cancer: Father Family History of Heart Disease: MGF, PGF  Social History: Reviewed history from 12/20/2009 and no changes required. Married Never Smoked Alcohol use-no Drug use-no Regular exercise-no Daily Caffeine Use  Review of Systems Derm:  Complains of dryness, itching, and rash; denies changes in color of skin, changes in nail beds, flushing, lesion(s), and poor wound healing.  Physical Exam  General:  alert, well-developed, well-nourished, well-hydrated, appropriate dress, normal appearance, healthy-appearing, cooperative to examination, and overweight-appearing.   Mouth:  Oral mucosa and oropharynx without lesions or exudates.  Teeth in good  repair. Neck:  supple, full ROM, no masses, no thyromegaly, no thyroid nodules or tenderness, no JVD, normal carotid upstroke, no carotid bruits, no cervical lymphadenopathy, and no neck tenderness.   Lungs:  normal respiratory effort, no intercostal retractions, no accessory muscle use, normal breath sounds, no dullness, no fremitus, no crackles, and no wheezes.   Heart:  normal rate, regular rhythm, no gallop, no rub, no JVD, and Grade   1/6 systolic ejection murmur.   Abdomen:  soft, non-tender, normal bowel sounds, no distention, no masses, no guarding, no rigidity, no rebound tenderness, no abdominal hernia, no inguinal hernia, no hepatomegaly, and no splenomegaly.   Msk:  No deformity or scoliosis noted of thoracic or lumbar spine.   Extremities:  No clubbing, cyanosis, edema, or deformity noted with normal full range of motion of all joints.   Neurologic:  No cranial nerve deficits noted. Station and gait are normal. Plantar reflexes are down-going bilaterally. DTRs are symmetrical throughout. Sensory, motor and coordinative functions appear intact. Skin:  dorsum of left foreram is more tanned than the right and has some xerosis and senile purpura but there are no vesicles, excoriations, exudate, streaking, warmth, induration, targets, papules, or macules. Cervical Nodes:  no anterior cervical adenopathy and no posterior cervical adenopathy.   Axillary Nodes:  no R axillary adenopathy and no L axillary adenopathy.   Psych:  Cognition and judgment appear intact. Alert and cooperative with normal attention span and concentration. No apparent delusions, illusions, hallucinations   Impression & Recommendations:  Problem # 1:  HYPERTENSION (ICD-401.9) Assessment Unchanged  Her updated medication list for this problem includes:    Tekturna Hct 150-12.5 Mg Tabs (Aliskiren-hydrochlorothiazide) ..... 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  Problem # 3:  HYPERPOTASSEMIA (R9761134.7) Assessment: Improved  Complete Medication List: 1)  Zantac 300 Mg Tabs (Ranitidine hcl) .... Take 1 tablet by mouth once a day 2)  Allegra 180 Mg Tabs (Fexofenadine hcl) .... Take 1 tablet by mouth once a day as needed 3)  Cpap 5-15 Auto Titrate  4)  Luxiq 0.12 % Foam (Betamethasone valerate) .... Apply to scalp as directed 5)  Vitamin D 2000 Unit Tabs (Cholecalciferol) .... Take 1 tablet by mouth once a day 6)  Lexapro 10 Mg Tabs (Escitalopram oxalate) .... Take 1 tablet by mouth once a day 7)  Mega Basic Tabs (Multiple vitamins-minerals) .... Take 1 tablet by mouth once a day 8)  Colestipol Hcl 1 Gm Tabs (Colestipol hcl) .... Two times a day 9)  Symbicort 160-4.5 Mcg/act Aero (Budesonide-formoterol fumarate) .... 2 puffs two times a day  for asthma 10)  Tekturna Hct 150-12.5 Mg Tabs (Aliskiren-hydrochlorothiazide) .... One by mouth once daily for high blood pressure  Hypertension Assessment/Plan:      The patient's hypertensive risk group is category C: Target organ damage and/or diabetes.  Her calculated 10 year risk of coronary heart disease is 6 %.  Today's blood pressure is 110/62.  Her blood pressure goal is < 140/90.  Patient Instructions: 1)  Please schedule a follow-up appointment in 2 months. 2)  It  is important that you exercise regularly at least 20 minutes 5 times a week. If you develop chest pain, have severe difficulty breathing, or feel very tired , stop exercising immediately and seek medical attention. 3)  You need to lose weight. Consider a lower calorie diet and regular exercise.  4)  Check your Blood Pressure regularly. If it is above 130/80: you should make an appointment.   Orders Added: 1)  Est. Patient Level IV [id number]

## 2010-09-03 NOTE — Letter (Signed)
Summary: Patient Notice- Polyp Results  [facility]  [address] [place], [place] [address]   Phone: [phone]  Fax: [phone]        February 05, 2010 MRN: [id number]    [facility] [address] [place], [place]  [address]    Dear [name],  I am pleased to inform you that the colon polyp(s) removed during your recent colonoscopy was (were) found to be benign (no cancer detected) upon pathologic examination.  I recommend you have a repeat colonoscopy examination in 3_ years to look for recurrent polyps, as having colon polyps increases your risk for having recurrent polyps or even colon cancer in the future.  Should you develop new or worsening symptoms of abdominal pain, bowel habit changes or bleeding from the rectum or bowels, please schedule an evaluation with either your primary care physician or with me.  Additional information/recommendations:  __ No further action with gastroenterology is needed at this time. Please      follow-up with your primary care physician for your other healthcare      needs.  __ Please call [phone] to schedule a return visit to review your      situation.  __ Please keep your follow-up visit as already scheduled.  x__ Continue treatment plan as outlined the day of your exam.  Please call us if you are having persistent problems or have questions about your condition that have not been fully answered at this time.  Sincerely,  Sable Feil MD [facility]  This letter has been electronically signed by your physician.  Appended Document: Patient Notice- Polyp Results letter mailed

## 2010-09-03 NOTE — Letter (Signed)
Summary: [facility]  [place], [place] [address]   Phone: [phone]  Fax: [phone]       Holly Simmons    February 04, 1941    MRN: [id number]        Procedure Day Sudie Grumbling: Wednesday, 01/30/10     Arrival Time: 12:30      Procedure Time: 1:30     Location of Procedure:                    _X _  [place] (4th Floor)    [place]  Prior to the day before your procedure, purchase one 8 oz. bottle of Magnesium Citrate and one Fleet Enema from the laxative section of your drugstore.  _________________________________________________________________________________________________  THE DAY BEFORE YOUR PROCEDURE             DATE: 01/29/10      DAY: Tuesday  1.   Have a clears only the day before your procedure.  No Solid Food.   2.   Do not drink anything colored red or purple.  Avoid juices with pulp.  No orange juice.              CLEAR LIQUIDS INCLUDE: Water Jello Ice Popsicles Tea (sugar ok, no milk/cream) Powdered fruit flavored drinks Coffee (sugar ok, no milk/cream) Gatorade Juice: apple, white grape, white cranberry  Lemonade Clear bullion, consomm, broth Carbonated beverages (any kind) Strained chicken noodle soup Hard Candy   3.   At 7:00 pm the night before your procedure, drink one bottle of Magnesium Citrate over ice.  4.   Drink at least 3 more glasses of clear liquids before bedtime (preferably juices).  5.   Results are expected usually within 1 to 6 hours after taking the Magnesium Citrate.  ___________________________________________________________________________________________________  THE DAY OF YOUR PROCEDURE            DATE: 01/30/10     DAY: Wednesday  1.   Use Fleet Enema one hour prior to coming for procedure.  2.   You may drink clear liquids until 11:30 (2 hours before exam)       MEDICATION INSTRUCTIONS  Unless  otherwise instructed, you should take regular prescription medications with a small sip of water as early as possible the morning of your procedure.                    OTHER INSTRUCTIONS  You will need a responsible adult at least 68 years of age to accompany you and drive you home.   This person must remain in the waiting room during your procedure.  Wear loose fitting clothing that is easily removed.  Leave jewelry and other valuables at home.  However, you may wish to bring a book to read or an iPod/MP3 player to listen to music as you wait for your procedure to start.  Remove all body piercing jewelry and leave at home.  Total time from sign-in until discharge is approximately 2-3 hours.  You should go home directly after your procedure and rest.  You can resume normal activities the day after your procedure.  The day of your procedure you should not:   Drive   Make legal decisions   Operate machinery   Drink alcohol   Return to work  You will receive specific instructions about eating, activities and medications before you leave.   The above instructions have  been reviewed and explained to me by   _______________________    I fully understand and can verbalize these instructions _____________________________ Date _________

## 2010-09-03 NOTE — Assessment & Plan Note (Signed)
Summary: 3 mos appt f/u /$50 /cd   Vital Signs:  Patient profile:   70 year old female Height:      63 inches Weight:      200 pounds BMI:     35.56 O2 Sat:      93 % Temp:     97.5 degrees F oral Pulse rate:   82 / minute Pulse rhythm:   regular BP sitting:   140 / 78  (right arm) Cuff size:   large  Vitals Entered By: Estell Harpin CMA (November 28, 2008 10:09 AM)  Nutrition Counseling: Patient's BMI is greater than 25 and therefore counseled on weight management options. CC: 3 mo follow up, Hypertension Management, Depression   Primary Care Provider:  Janith Lima MD  CC:  3 mo follow up, Hypertension Management, and Depression.  History of Present Illness: Pt reports to improve BP control. See scanned BP's recorded  at home.  She remains stressed about her ill husband and other family issues.  Depression History:      The patient presents with symptoms of depression which have been present for greater than two weeks.  She notes that the symptoms started approximately 11/09/2007.  The patient is having a depressed mood most of the day and has a diminished interest in her usual daily activities.  Positive alarm features for depression include significant weight gain, insomnia, fatigue (loss of energy), feelings of worthlessness (guilt), and impaired concentration (indecisiveness).  However, she denies significant weight loss, hypersomnia, psychomotor agitation, psychomotor retardation, and recurrent thoughts of death or suicide.  The patient denies symptoms of a manic disorder including persistently & abnormally elevated mood, abnormally & persistently irritable mood, less need for sleep, talkative or feels need to keep talking, distractibility, flight of ideas, increase in goal-directed activity, psychomotor agitation, inflated self-esteem or grandiosity, excessive buying sprees, excessive sexual indiscretions, and excessive foolish business investments.        Psychosocial  stress factors include a recent traumatic event and major life changes.  Risk factors for depression include a personal history of depression.  The patient denies that she feels like life is not worth living, denies that she wishes that she were dead, and denies that she has thought about ending her life.         Depression Treatment History (Reviewed):  Prior Medication Used:   Start Date: Assessment of Effect:   Comments:  Lexapro     10/10/2008   no improvement       -- Cymbalta     07/12/2008   no improvement       --  Hypertension History:      She complains of headache and peripheral edema, but denies chest pain, palpitations, dyspnea with exertion, orthopnea, PND, visual symptoms, neurologic problems, syncope, and side effects from treatment.  She notes no problems with any antihypertensive medication side effects.        Positive major cardiovascular risk factors include female age 38 years old or older and hypertension.  Negative major cardiovascular risk factors include no history of diabetes or hyperlipidemia, negative family history for ischemic heart disease, and non-tobacco-user status.        Positive history for target organ damage include hypertensive retinopathy.  Further assessment for target organ damage reveals no history of ASHD, cardiac end-organ damage (CHF/LVH), stroke/TIA, peripheral vascular disease, or renal insufficiency.      Preventive Screening-Counseling & Management     Alcohol drinks/day: 0  Clinical Review Panels:  Complete Metabolic Panel   Glucose:  99 (08/10/2008)   Sodium:  136 (08/10/2008)   Potassium:  4.0 (08/10/2008)   Chloride:  95 (08/10/2008)   CO2:  32 (08/10/2008)   BUN:  15 (08/10/2008)   Creatinine:  0.9 (08/10/2008)   Albumin:  3.8 (08/10/2008)   Total Protein:  7.8 (08/10/2008)   Calcium:  9.8 (08/10/2008)   Total Bili:  0.7 (08/10/2008)   Alk Phos:  103 (08/10/2008)   SGPT (ALT):  27 (08/10/2008)   SGOT (AST):  30  (08/10/2008)   Current Medications (verified): 1)  Zantac 300 Mg  Tabs (Ranitidine Hcl) .... Take 1 Tablet By Mouth Once A Day 2)  Spironolactone-Hctz 25-25 Mg  Tabs (Spironolactone-Hctz) .... Take 1  Tab By Mouth Daily 3)  Zyrtec Allergy 10 Mg  Tabs (Cetirizine Hcl) .... Take 1 Tab By Mouth At Bedtime As Needed 4)  Cpap 5-15 Auto Titrate 5)  Luxiq 0.12 % Foam (Betamethasone Valerate) .... Apply To Scalp As Directed 6)  Vitamin D 2000 Unit Tabs (Cholecalciferol) .... Take 1 Tablet By Mouth Once A Day 7)  Lexapro 10 Mg Tabs (Escitalopram Oxalate) .... Take 1 Tablet By Mouth Once A Day 8)  Tekturna 150 Mg Tabs (Aliskiren Fumarate) .... One By Mouth Once Daily  Allergies (verified): 1)  ! Sulfa 2)  ! Nsaids 3)  ! Azor  Past History:  Past Medical History:    Reviewed history from 08/10/2008 and no changes required:    Allergic Rhinitis    Hypertension    Allergic rhinitis    COPD    GERD    Renal insufficiency    Heart murmur    Psoriasis  Past Surgical History:    Reviewed history from 08/10/2008 and no changes required:    Appendectomy    Cholecystectomy    Hysterectomy    Tonsillectomy  Family History:    Reviewed history from 08/10/2008 and no changes required:       Dementia       Family History Breast cancer 1st degree relative <50       Family History Hypertension  Social History:    Reviewed history from 08/10/2008 and no changes required:       Married       Never Smoked       Alcohol use-no       Drug use-no       Regular exercise-no  Review of Systems       The patient complains of weight gain, headaches, and depression.  The patient denies hemoptysis, abdominal pain, hematuria, and angioedema.    Physical Exam  General:  obese female in nad Head:  normocephalic and atraumatic.   Eyes:  vision grossly intact, pupils equal, pupils round, and a-v nicking.   Mouth:  Oral mucosa and oropharynx without lesions or exudates.  Teeth in good repair. Neck:   supple, full ROM, no masses, and no thyromegaly.   Lungs:  Normal respiratory effort, chest expands symmetrically. Lungs are clear to auscultation, no crackles or wheezes. Heart:  normal rate and Grade  1/6 systolic ejection murmur, heard best in RUSB, but also heard along LLSB.  normal rate, no gallop, no rub, no JVD, no heaves. Abdomen:  soft, non-tender, normal bowel sounds, no hepatomegaly, and no splenomegaly.   Msk:  No deformity or scoliosis noted of thoracic or lumbar spine.   Pulses:  R and L carotid,radial,femoral,dorsalis pedis and posterior tibial pulses are full and equal bilaterally Extremities:  trace left pedal edema and trace right pedal edema.   Neurologic:  No cranial nerve deficits noted. Station and gait are normal. Plantar reflexes are down-going bilaterally. DTRs are symmetrical throughout. Sensory, motor and coordinative functions appear intact. Skin:  turgor normal, color normal, and no rashes.   Cervical Nodes:  No lymphadenopathy noted Psych:  Oriented X3, memory intact for recent and remote, good eye contact, not agitated, subdued, tearful, and slightly anxious.     Impression & Recommendations:  Problem # 1:  DEPRESSIVE DISORDER (ICD-311) Assessment Deteriorated start psychotherapy The following medications were removed from the medication list:    Cymbalta 60 Mg Cpep (Duloxetine hcl) .Marland Kitchen... Take 1 tablet by mouth once a day Her updated medication list for this problem includes:    Lexapro 10 Mg Tabs (Escitalopram oxalate) .Marland Kitchen... Take 1 tablet by mouth once a day  Problem # 2:  RENAL INSUFFICIENCY (ICD-588.9) Assessment: Unchanged  Problem # 3:  HYPERTENSION (ICD-401.9) Assessment: Deteriorated  Her updated medication list for this problem includes:    Spironolactone-hctz 25-25 Mg Tabs (Spironolactone-hctz) .Marland Kitchen... Take 1  tab by mouth daily    Tekturna 150 Mg Tabs (Aliskiren fumarate) ..... One by mouth once daily  Problem # 4:  PARESTHESIA  (ICD-782.0) Assessment: Improved  Complete Medication List: 1)  Zantac 300 Mg Tabs (Ranitidine hcl) .... Take 1 tablet by mouth once a day 2)  Spironolactone-hctz 25-25 Mg Tabs (Spironolactone-hctz) .... Take 1  tab by mouth daily 3)  Zyrtec Allergy 10 Mg Tabs (Cetirizine hcl) .... Take 1 tab by mouth at bedtime as needed 4)  Cpap 5-15 Auto Titrate  5)  Luxiq 0.12 % Foam (Betamethasone valerate) .... Apply to scalp as directed 6)  Vitamin D 2000 Unit Tabs (Cholecalciferol) .... Take 1 tablet by mouth once a day 7)  Lexapro 10 Mg Tabs (Escitalopram oxalate) .... Take 1 tablet by mouth once a day 8)  Tekturna 150 Mg Tabs (Aliskiren fumarate) .... One by mouth once daily  Hypertension Assessment/Plan:      The patient's hypertensive risk group is category C: Target organ damage and/or diabetes.  Today's blood pressure is 140/78.  Her blood pressure goal is < 140/90.  Patient Instructions: 1)  Please schedule a follow-up appointment in 2 weeks. 2)  It is important that you exercise regularly at least 20 minutes 5 times a week. If you develop chest pain, have severe difficulty breathing, or feel very tired , stop exercising immediately and seek medical attention. 3)  You need to lose weight. Consider a lower calorie diet and regular exercise.  4)  Check your Blood Pressure regularly. If it is above 130/80: you should make an appointment. 5)  Limit your Sodium (Salt) to less than 4 grams a day (slightly less than 1 teaspoon) to prevent fluid retention, swelling, or worsening or symptoms. Prescriptions: TEKTURNA 150 MG TABS (ALISKIREN FUMARATE) One by mouth once daily  #28 x 0   Entered and Authorized by:   Janith Lima MD   Signed by:   Janith Lima MD on 11/28/2008   Method used:   Historical   [id number] SPIRONOLACTONE-HCTZ 25-25 MG  TABS (SPIRONOLACTONE-HCTZ) take 1  tab by mouth daily  #30 x 5   Entered and Authorized by:   Janith Lima MD   Signed by:   Janith Lima MD on 11/28/2008   Method used:   Electronically to        [facility] (retail)       [place], [place]  [address]       Ph: [phone]       Fax: [phone]   RxID:   [id number]

## 2010-09-03 NOTE — Letter (Signed)
Summary: [facility]   Imported By: Bubba Hales 11/28/2009 10:00:06  _____________________________________________________________________  External Attachment:    Type:   Image     Comment:   External Document

## 2010-09-03 NOTE — Letter (Signed)
Summary: Lipid Letter  [facility]   [place], [place] [address]   Phone: [phone]  Fax: [phone]    11/01/2009  Holly Simmons [place], [place]  [address]  Dear Holly Simmons:  We have carefully reviewed your last lipid profile from 10/31/2009 and the results are noted below with a summary of recommendations for lipid management.    Cholesterol:       148     Goal: <200   HDL "good" Cholesterol:   43.80     Goal: >40   LDL "bad" Cholesterol:   81     Goal: <130   Triglycerides:       117.0     Goal: <150        TLC Diet (Therapeutic Lifestyle Change): Saturated Fats & Transfatty acids should be kept < 7% of total calories ***Reduce Saturated Fats Polyunstaurated Fat can be up to 10% of total calories Monounsaturated Fat Fat can be up to 20% of total calories Total Fat should be no greater than 25-35% of total calories Carbohydrates should be 50-60% of total calories Protein should be approximately 15% of total calories Fiber should be at least 20-30 grams a day ***Increased fiber may help lower LDL Total Cholesterol should be < 200mg /day Consider adding plant stanol/sterols to diet (example: Benacol spread) ***A higher intake of unsaturated fat may reduce Triglycerides and Increase HDL    Adjunctive Measures (may lower LIPIDS and reduce risk of Heart Attack) include: Aerobic Exercise (20-30 minutes 3-4 times a week) Limit Alcohol Consumption Weight Reduction Aspirin 75-81 mg a day by mouth (if not allergic or contraindicated) Dietary Fiber 20-30 grams a day by mouth     Current Medications: 1)    Zantac 300 Mg  Tabs (Ranitidine hcl) .... Take 1 tablet by mouth once a day 2)    Spironolactone-hctz 25-25 Mg  Tabs (Spironolactone-hctz) .... Take 1  tab by mouth daily 3)    Zyrtec Allergy 10 Mg  Tabs (Cetirizine hcl) .... Take 1 tab by mouth at bedtime as needed 4)    Cpap 5-15 Auto Titrate  5)    Luxiq 0.12 % Foam  (Betamethasone valerate) .... Apply to scalp as directed 6)    Vitamin D 2000 Unit Tabs (Cholecalciferol) .... Take 1 tablet by mouth once a day 7)    Lexapro 10 Mg Tabs (Escitalopram oxalate) .... Take 1 tablet by mouth once a day 8)    Tekturna 150 Mg Tabs (Aliskiren fumarate) .... One by mouth once daily  If you have any questions, please call. We appreciate being able to work with you.   Sincerely,    [facility] Janith Lima MD

## 2010-09-03 NOTE — Assessment & Plan Note (Signed)
Summary: cold,cough not improved/cd   Vital Signs:  Patient profile:   70 year old female Height:      63 inches Weight:      195 pounds BMI:     34.67 O2 Sat:      95 % on Room air Temp:     97.6 degrees F oral Pulse rate:   82 / minute Pulse rhythm:   regular Resp:     16 per minute BP sitting:   110 / 60  (left arm) Cuff size:   large  Vitals Entered By: Estell Harpin CMA (April 02, 2010 10:03 AM)  Nutrition Counseling: Patient's BMI is greater than 25 and therefore counseled on weight management options.  O2 Flow:  Room air CC: Patient c/o fatigue, cough, and wheezing, URI symptoms   Primary Care Provider:  Scarlette Calico, MD  CC:  Patient c/o fatigue, cough, and wheezing, and URI symptoms.  History of Present Illness:  URI Symptoms      This is a 70 year old woman who presents with URI symptoms.  The symptoms began 2 weeks ago.  The severity is described as mild.  The patient reports dry cough, but denies nasal congestion, clear nasal discharge, purulent nasal discharge, sore throat, productive cough, earache, and sick contacts.  Associated symptoms include wheezing.  The patient denies fever, stiff neck, dyspnea, rash, vomiting, diarrhea, use of an antipyretic, and response to antipyretic.  The patient also reports severe fatigue.  The patient denies headache and muscle aches.  The patient denies the following risk factors for Strep sinusitis: unilateral facial pain, unilateral nasal discharge, and poor response to decongestant.    Preventive Screening-Counseling & Management  Alcohol-Tobacco     Alcohol drinks/day: 0     Smoking Status: never     Passive Smoke Exposure: no     Tobacco Counseling: not indicated; no tobacco use  Hep-HIV-STD-Contraception     Hepatitis Risk: no risk noted     HIV Risk: no     STD Risk: no risk noted     SBE monthly: yes     SBE Education/Counseling: to perform regular SBE      Sexual History:  currently monogamous.        Drug  Use:  never.        Blood Transfusions:  no.    Medications Prior to Update: 1)  Zantac 300 Mg  Tabs (Ranitidine Hcl) .... Take 1 Tablet By Mouth Once A Day 2)  Spironolactone-Hctz 25-25 Mg  Tabs (Spironolactone-Hctz) .... Take 1  Tab By Mouth Daily 3)  Allegra 180 Mg Tabs (Fexofenadine Hcl) .... Take 1 Tablet By Mouth Once A Day As Needed 4)  Cpap 5-15 Auto Titrate 5)  Luxiq 0.12 % Foam (Betamethasone Valerate) .... Apply To Scalp As Directed 6)  Vitamin D 2000 Unit Tabs (Cholecalciferol) .... Take 1 Tablet By Mouth Once A Day 7)  Lexapro 10 Mg Tabs (Escitalopram Oxalate) .... Take 1 Tablet By Mouth Once A Day 8)  Tekturna 150 Mg Tabs (Aliskiren Fumarate) .... One By Mouth Once Daily 9)  Mega Basic  Tabs (Multiple Vitamins-Minerals) .... Take 1 Tablet By Mouth Once A Day 10)  Colestipol Hcl 1 Gm Tabs (Colestipol Hcl) .... Two Times A Day  Current Medications (verified): 1)  Zantac 300 Mg  Tabs (Ranitidine Hcl) .... Take 1 Tablet By Mouth Once A Day 2)  Spironolactone-Hctz 25-25 Mg  Tabs (Spironolactone-Hctz) .... Take 1  Tab By Mouth Daily 3)  Allegra 180 Mg Tabs (Fexofenadine Hcl) .... Take 1 Tablet By Mouth Once A Day As Needed 4)  Cpap 5-15 Auto Titrate 5)  Luxiq 0.12 % Foam (Betamethasone Valerate) .... Apply To Scalp As Directed 6)  Vitamin D 2000 Unit Tabs (Cholecalciferol) .... Take 1 Tablet By Mouth Once A Day 7)  Lexapro 10 Mg Tabs (Escitalopram Oxalate) .... Take 1 Tablet By Mouth Once A Day 8)  Tekturna 150 Mg Tabs (Aliskiren Fumarate) .... One By Mouth Once Daily 9)  Mega Basic  Tabs (Multiple Vitamins-Minerals) .... Take 1 Tablet By Mouth Once A Day 10)  Colestipol Hcl 1 Gm Tabs (Colestipol Hcl) .... Two Times A Day 11)  Symbicort 160-4.5 Mcg/act Aero (Budesonide-Formoterol Fumarate) .... 2 Puffs Two Times A Day For Asthma  Allergies (verified): 1)  ! Sulfa 2)  ! Nsaids 3)  ! Azor  Past History:  Past Medical History: Last updated: 08/10/2008 Allergic  Rhinitis Hypertension Allergic rhinitis COPD GERD Renal insufficiency Heart murmur Psoriasis  Past Surgical History: Last updated: 12/20/2009 Appendectomy Cholecystectomy Hysterectomy Tonsillectomy C-Section  Family History: Last updated: 12/20/2009 Dementia Family History Breast cancer 1st degree relative <50 Family History Hypertension No FH of Colon Cancer: Family History of Ovarian Cancer: Mat Aunt Family History of Bladder Cancer: Father Family History of Heart Disease: MGF, PGF  Social History: Last updated: 12/20/2009 Married Never Smoked Alcohol use-no Drug use-no Regular exercise-no Daily Caffeine Use  Risk Factors: Alcohol Use: 0 (04/02/2010) Exercise: no (08/10/2008)  Risk Factors: Smoking Status: never (04/02/2010) Passive Smoke Exposure: no (04/02/2010)  Family History: Reviewed history from 12/20/2009 and no changes required. Dementia Family History Breast cancer 1st degree relative <50 Family History Hypertension No FH of Colon Cancer: Family History of Ovarian Cancer: Mat Aunt Family History of Bladder Cancer: Father Family History of Heart Disease: MGF, PGF  Social History: Reviewed history from 12/20/2009 and no changes required. Married Never Smoked Alcohol use-no Drug use-no Regular exercise-no Daily Caffeine Use  Review of Systems  The patient denies anorexia, fever, weight loss, chest pain, syncope, dyspnea on exertion, peripheral edema, headaches, hemoptysis, abdominal pain, hematuria, suspicious skin lesions, enlarged lymph nodes, and angioedema.    Physical Exam  General:  alert, well-developed, well-nourished, well-hydrated, and overweight-appearing.   Mouth:  no exudates, no posterior lymphoid hypertrophy, no postnasal drip, no pharyngeal crowing, no lesions, no aphthous ulcers, no erosions, no tongue abnormalities, and pharyngeal erythema.   Lungs:  normal respiratory effort, no intercostal retractions, no accessory  muscle use, normal breath sounds, no dullness, no fremitus, no crackles, and no wheezes.   Heart:  normal rate, regular rhythm, no murmur, no gallop, and no rub.   Abdomen:  soft, non-tender, normal bowel sounds, no distention, no masses, no guarding, no rigidity, no rebound tenderness, no abdominal hernia, no inguinal hernia, no hepatomegaly, and no splenomegaly.   Msk:  No deformity or scoliosis noted of thoracic or lumbar spine.   Pulses:  R and L carotid,radial,femoral,dorsalis pedis and posterior tibial pulses are full and equal bilaterally Extremities:  No clubbing, cyanosis, edema, or deformity noted with normal full range of motion of all joints.   Neurologic:  No cranial nerve deficits noted. Station and gait are normal. Plantar reflexes are down-going bilaterally. DTRs are symmetrical throughout. Sensory, motor and coordinative functions appear intact. Skin:  turgor normal, color normal, no rashes, no suspicious lesions, no ecchymoses, no petechiae, and no purpura.   Cervical Nodes:  no anterior cervical adenopathy and no posterior cervical adenopathy.  Psych:  Cognition and judgment appear intact. Alert and cooperative with normal attention span and concentration. No apparent delusions, illusions, hallucinations   Impression & Recommendations:  Problem # 1:  ASTHMA NOS W/ACUTE EXACERBATION [id number]) Assessment New  try depo-medrol IM and start Symbicort   Pulmonary Functions Reviewed: O2 sat: 95 (04/02/2010)  Her updated medication list for this problem includes:    Symbicort 160-4.5 Mcg/act Aero (Budesonide-formoterol fumarate) .Marland Kitchen... 2 puffs two times a day for asthma  Problem # 2:  HYPERTENSION (ICD-401.9) Assessment: Improved  Her updated medication list for this problem includes:    Spironolactone-hctz 25-25 Mg Tabs (Spironolactone-hctz) .Marland Kitchen... Take 1  tab by mouth daily    Tekturna 150 Mg Tabs (Aliskiren fumarate) ..... One by mouth once daily  BP today:  110/60 Prior BP: 122/68 (03/20/2010)  Prior 10 Yr Risk Heart Disease: Not enough information (08/10/2008)  Labs Reviewed: K+: 4.0 (10/31/2009) Creat: : 1.3 (10/31/2009)   Chol: 148 (10/31/2009)   HDL: 43.80 (10/31/2009)   LDL: 81 (10/31/2009)   TG: 117.0 (10/31/2009)  Complete Medication List: 1)  Zantac 300 Mg Tabs (Ranitidine hcl) .... Take 1 tablet by mouth once a day 2)  Spironolactone-hctz 25-25 Mg Tabs (Spironolactone-hctz) .... Take 1  tab by mouth daily 3)  Allegra 180 Mg Tabs (Fexofenadine hcl) .... Take 1 tablet by mouth once a day as needed 4)  Cpap 5-15 Auto Titrate  5)  Luxiq 0.12 % Foam (Betamethasone valerate) .... Apply to scalp as directed 6)  Vitamin D 2000 Unit Tabs (Cholecalciferol) .... Take 1 tablet by mouth once a day 7)  Lexapro 10 Mg Tabs (Escitalopram oxalate) .... Take 1 tablet by mouth once a day 8)  Tekturna 150 Mg Tabs (Aliskiren fumarate) .... One by mouth once daily 9)  Mega Basic Tabs (Multiple vitamins-minerals) .... Take 1 tablet by mouth once a day 10)  Colestipol Hcl 1 Gm Tabs (Colestipol hcl) .... Two times a day 11)  Symbicort 160-4.5 Mcg/act Aero (Budesonide-formoterol fumarate) .... 2 puffs two times a day for asthma  Other Orders: Admin of Therapeutic Inj  intramuscular or subcutaneous [id number]) Depo- Medrol 40mg  (J1030) Depo- Medrol 80mg  (J1040)  Patient Instructions: 1)  Please schedule a follow-up appointment in 1 month. 2)  It is important that you exercise regularly at least 20 minutes 5 times a week. If you develop chest pain, have severe difficulty breathing, or feel very tired , stop exercising immediately and seek medical attention. 3)  You need to lose weight. Consider a lower calorie diet and regular exercise.  4)  Check your Blood Pressure regularly. If it is above 130/80: you should make an appointment. 5)  It is important to use your inhaler properly. Use a spacer, take slow deep breaths and hold them. Rinse your mouth after using.   Prescriptions: SYMBICORT 160-4.5 MCG/ACT AERO (BUDESONIDE-FORMOTEROL FUMARATE) 2 puffs two times a day for asthma  #2 inhs x 0   Entered and Authorized by:   Janith Lima MD   Signed by:   Janith Lima MD on 04/02/2010   Method used:   Samples Given   RxID:   [id number]    Medication Administration  Injection # 1:    Medication: Depo- Medrol 80mg     Diagnosis: ALLERGIC RHINITIS (ICD-477.9)    Route: IM    Site: R deltoid    Exp Date: 11/2012    Lot #: [id number]    Mfr: [email or web address]    Patient tolerated injection without complications  Given by: Estell Harpin CMA (April 02, 2010 10:26 AM)  Injection # 2:    Medication: Depo- Medrol 40mg     Diagnosis: ALLERGIC RHINITIS (ICD-477.9)    Route: IM    Site: R deltoid    Exp Date: 11/2012    Lot #: [id number]    Mfr: pfizer    Patient tolerated injection without complications    Given by: Estell Harpin CMA (April 02, 2010 10:26 AM)  Orders Added: 1)  Admin of Therapeutic Inj  intramuscular or subcutaneous [96372] 2)  Depo- Medrol 40mg  [J1030] 3)  Depo- Medrol 80mg  [J1040] 4)  Est. Patient Level IV [id number]

## 2010-09-03 NOTE — Assessment & Plan Note (Signed)
Summary: FU Holly Simmons  #   Vital Signs:  Patient profile:   70 year old female Height:      63 inches Weight:      196 pounds BMI:     34.85 O2 Sat:      95 % on Room air Temp:     97.0 degrees F oral Pulse rate:   86 / minute Pulse rhythm:   regular Resp:     16 per minute BP sitting:   108 / 60  (left arm) Cuff size:   large  Vitals Entered By: Estell Harpin CMA (April 24, 2010 8:23 AM)  Nutrition Counseling: Patient's BMI is greater than 25 and therefore counseled on weight management options.  O2 Flow:  Room air CC: follow-up visit Is Patient Diabetic? No Pain Assessment Patient in pain? no       Does patient need assistance? Functional Status Self care Ambulation Normal   Primary Care [name]:  Scarlette Calico, MD  CC:  follow-up visit.  History of Present Illness: She returns c/o an occasional non-productive cough but the cough has not been severe enough for her to take a cough med. One week ago she was in [place] and she had a coughing spell and developed pain around her left trapezius area. She has rare wheezing but no SOB/DOE/edema.  Current Medications (verified): 1)  Zantac 300 Mg  Tabs (Ranitidine Hcl) .... Take 1 Tablet By Mouth Once A Day 2)  Spironolactone-Hctz 25-25 Mg  Tabs (Spironolactone-Hctz) .... Take 1  Tab By Mouth Daily 3)  Allegra 180 Mg Tabs (Fexofenadine Hcl) .... Take 1 Tablet By Mouth Once A Day As Needed 4)  Cpap 5-15 Auto Titrate 5)  Luxiq 0.12 % Foam (Betamethasone Valerate) .... Apply To Scalp As Directed 6)  Vitamin D 2000 Unit Tabs (Cholecalciferol) .... Take 1 Tablet By Mouth Once A Day 7)  Lexapro 10 Mg Tabs (Escitalopram Oxalate) .... Take 1 Tablet By Mouth Once A Day 8)  Tekturna 150 Mg Tabs (Aliskiren Fumarate) .... One By Mouth Once Daily 9)  Mega Basic  Tabs (Multiple Vitamins-Minerals) .... Take 1 Tablet By Mouth Once A Day 10)  Colestipol Hcl 1 Gm Tabs (Colestipol Hcl) .... Two Times A Day 11)  Symbicort 160-4.5 Mcg/act  Aero (Budesonide-Formoterol Fumarate) .... 2 Puffs Two Times A Day For Asthma  Allergies (verified): 1)  ! Sulfa 2)  ! Nsaids 3)  ! Azor  Past History:  Past Medical History: Last updated: 08/10/2008 Allergic Rhinitis Hypertension Allergic rhinitis COPD GERD Renal insufficiency Heart murmur Psoriasis  Past Surgical History: Last updated: 12/20/2009 Appendectomy Cholecystectomy Hysterectomy Tonsillectomy C-Section  Family History: Last updated: 12/20/2009 Dementia Family History Breast cancer 1st degree relative <50 Family History Hypertension No FH of Colon Cancer: Family History of Ovarian Cancer: Mat Aunt Family History of Bladder Cancer: Father Family History of Heart Disease: MGF, PGF  Social History: Last updated: 12/20/2009 Married Never Smoked Alcohol use-no Drug use-no Regular exercise-no Daily Caffeine Use  Risk Factors: Alcohol Use: 0 (04/02/2010) Exercise: no (08/10/2008)  Risk Factors: Smoking Status: never (04/02/2010) Passive Smoke Exposure: no (04/02/2010)  Family History: Reviewed history from 12/20/2009 and no changes required. Dementia Family History Breast cancer 1st degree relative <50 Family History Hypertension No FH of Colon Cancer: Family History of Ovarian Cancer: Mat Aunt Family History of Bladder Cancer: Father Family History of Heart Disease: MGF, PGF  Social History: Reviewed history from 12/20/2009 and no changes required. Married Never Smoked Alcohol use-no Drug use-no  Regular exercise-no Daily Caffeine Use  Review of Systems       The patient complains of weight gain, chest pain, and prolonged cough.  The patient denies anorexia, fever, weight loss, syncope, dyspnea on exertion, peripheral edema, headaches, hemoptysis, abdominal pain, hematuria, suspicious skin lesions, enlarged lymph nodes, and angioedema.   Resp:  Complains of chest discomfort, cough, and wheezing; denies chest pain with inspiration,  coughing up blood, excessive snoring, hypersomnolence, morning headaches, pleuritic, shortness of breath, and sputum productive. GU:  Denies abnormal vaginal bleeding, decreased libido, dysuria, hematuria, incontinence, nocturia, urinary frequency, and urinary hesitancy.  Physical Exam  General:  alert, well-developed, well-nourished, well-hydrated, appropriate dress, normal appearance, healthy-appearing, cooperative to examination, and overweight-appearing.   Head:  normocephalic, atraumatic, no abnormalities observed, and no abnormalities palpated.   Eyes:  vision grossly intact, pupils equal, pupils round, and pupils reactive to light.   Mouth:  Oral mucosa and oropharynx without lesions or exudates.  Teeth in good repair. Neck:  supple, full ROM, no masses, no thyromegaly, no thyroid nodules or tenderness, no JVD, normal carotid upstroke, no carotid bruits, no cervical lymphadenopathy, and no neck tenderness.   Chest Wall:  No deformities, masses, or tenderness noted. Breasts:  No mass, nodules, thickening, tenderness, bulging, retraction, inflamation, nipple discharge or skin changes noted.   Lungs:  normal respiratory effort, no intercostal retractions, no accessory muscle use, normal breath sounds, no dullness, no fremitus, no crackles, and no wheezes.   Heart:  normal rate, regular rhythm, no gallop, no rub, no JVD, and Grade   1/6 systolic ejection murmur.   Abdomen:  soft, non-tender, normal bowel sounds, no distention, no masses, no guarding, no rigidity, no rebound tenderness, no abdominal hernia, no inguinal hernia, no hepatomegaly, and no splenomegaly.   Msk:  No deformity or scoliosis noted of thoracic or lumbar spine.   Pulses:  R and L carotid,radial,femoral,dorsalis pedis and posterior tibial pulses are full and equal bilaterally Extremities:  No clubbing, cyanosis, edema, or deformity noted with normal full range of motion of all joints.   Neurologic:  No cranial nerve deficits  noted. Station and gait are normal. Plantar reflexes are down-going bilaterally. DTRs are symmetrical throughout. Sensory, motor and coordinative functions appear intact. Skin:  turgor normal, color normal, no rashes, no suspicious lesions, no ecchymoses, no petechiae, and no purpura.   Cervical Nodes:  no anterior cervical adenopathy and no posterior cervical adenopathy.   Axillary Nodes:  no R axillary adenopathy and no L axillary adenopathy.   Psych:  Cognition and judgment appear intact. Alert and cooperative with normal attention span and concentration. No apparent delusions, illusions, hallucinations Additional Exam:  EKG is unchanged compared to her prior EKG.   Impression & Recommendations:  Problem # 1:  CHEST PAIN (ICD-786.50) Assessment New this sounds musculoskeletal, will check some cardiac enzymes and a D-dimer for certainty Orders: TLB-Cardiac Panel [id number]) T-D-Dimer Fibrin Derivatives Quantitive [id number]) EKG w/ Interpretation (93000)  Problem # 2:  ASTHMA NOS W/ACUTE EXACERBATION (ICD-493.92) Assessment: Unchanged  Her updated medication list for this problem includes:    Symbicort 160-4.5 Mcg/act Aero (Budesonide-formoterol fumarate) .Marland Kitchen... 2 puffs two times a day for asthma  Problem # 3:  ABNORMAL ELECTROCARDIOGRAM (ICD-794.31) Assessment: Unchanged  Orders: EKG w/ Interpretation (93000)  Problem # 4:  RENAL INSUFFICIENCY (ICD-588.9) Assessment: Unchanged  Orders: Venipuncture [id number]) TLB-BMP (Basic Metabolic Panel-BMET) (99991111)  Problem # 5:  HYPERTENSION (ICD-401.9) Assessment: Improved  Her updated medication list for this problem includes:  Spironolactone-hctz 25-25 Mg Tabs (Spironolactone-hctz) .Marland Kitchen... Take 1  tab by mouth daily    Tekturna 150 Mg Tabs (Aliskiren fumarate) ..... One by mouth once daily  BP today: 108/60 Prior BP: 110/60 (04/02/2010)  Prior 10 Yr Risk Heart Disease: Not enough information (08/10/2008)  Labs  Reviewed: K+: 4.0 (10/31/2009) Creat: : 1.3 (10/31/2009)   Chol: 148 (10/31/2009)   HDL: 43.80 (10/31/2009)   LDL: 81 (10/31/2009)   TG: 117.0 (10/31/2009)  Complete Medication List: 1)  Zantac 300 Mg Tabs (Ranitidine hcl) .... Take 1 tablet by mouth once a day 2)  Spironolactone-hctz 25-25 Mg Tabs (Spironolactone-hctz) .... Take 1  tab by mouth daily 3)  Allegra 180 Mg Tabs (Fexofenadine hcl) .... Take 1 tablet by mouth once a day as needed 4)  Cpap 5-15 Auto Titrate  5)  Luxiq 0.12 % Foam (Betamethasone valerate) .... Apply to scalp as directed 6)  Vitamin D 2000 Unit Tabs (Cholecalciferol) .... Take 1 tablet by mouth once a day 7)  Lexapro 10 Mg Tabs (Escitalopram oxalate) .... Take 1 tablet by mouth once a day 8)  Tekturna 150 Mg Tabs (Aliskiren fumarate) .... One by mouth once daily 9)  Mega Basic Tabs (Multiple vitamins-minerals) .... Take 1 tablet by mouth once a day 10)  Colestipol Hcl 1 Gm Tabs (Colestipol hcl) .... Two times a day 11)  Symbicort 160-4.5 Mcg/act Aero (Budesonide-formoterol fumarate) .... 2 puffs two times a day for asthma  Other Orders: Flu Vaccine 31yrs + MEDICARE PATIENTS [id number]) Administration Flu vaccine - MCR [id number]) Flu Vaccine Consent Questions     Do you have a history of severe allergic reactions to this vaccine? no    Any prior history of allergic reactions to egg and/or gelatin? no    Do you have a sensitivity to the preservative Thimersol? no    Do you have a past history of Guillan-Barre Syndrome? no    Do you currently have an acute febrile illness? no    Have you ever had a severe reaction to latex? no    Vaccine information given and explained to patient? yes    Are you currently pregnant? no    Lot Number:[id number]   Exp Date:02/01/2011   Site Given  Left Deltoid IM Flu Vaccine 61yrs + MEDICARE PATIENTS [id number]) Administration Flu vaccine - MCR [id number])  Patient Instructions: 1)  Please schedule a follow-up appointment in 2 months. 2)  It is  important that you exercise regularly at least 20 minutes 5 times a week. If you develop chest pain, have severe difficulty breathing, or feel very tired , stop exercising immediately and seek medical attention. 3)  You need to lose weight. Consider a lower calorie diet and regular exercise.  4)  Check your Blood Pressure regularly. If it is above 130/80: you should make an appointment. 5)  Take 650-1000mg  of Tylenol every 4-6 hours as needed for relief of pain or comfort of fever AVOID taking more than 4000mg   in a 24 hour period (can cause liver damage in higher doses). Prescriptions: SYMBICORT 160-4.5 MCG/ACT AERO (BUDESONIDE-FORMOTEROL FUMARATE) 2 puffs two times a day for asthma  #10 inhs x 0   Entered and Authorized by:   Janith Lima MD   Signed by:   Janith Lima MD on 04/24/2010   Method used:   Samples Given   RxID:   [id number] TEKTURNA 150 MG TABS (ALISKIREN FUMARATE) One by mouth once daily  #112 x 0   Entered and Authorized  by:   Janith Lima MD   Signed by:   Janith Lima MD on 04/24/2010   Method used:   Samples Given   RxID:   [id number]   .[email or web address]

## 2010-09-05 NOTE — Assessment & Plan Note (Signed)
Summary: 2 MONTH ROV/SL   Visit Type:  Follow-up Referring Rhenda [place]:  na Primary Xolani Degracia:  Scarlette Calico, MD  CC:  No complaints.  History of Present Illness: Orie is seen today at the request of Dr Ronnald Ramp.  She has had recent URI with bronchitis.  She has had a cough and atypical SSCP.  She has an echo 1/10 with signs of LVH and LVOT gradient.  This was not addressed or Rxed at the time.  She has mild CRF and sees Dr Clover Mealy.  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  She has recently been placed on a diuretic for edema.  She thinks she has had increased bruising in the left arm since then.  She denies syncope but does occasionally get dizzy with change in position.  Pain in chest improving with less cough.  No fever.  Denies palpitatons.    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  Last visit stopped diuretic and started low dose Coreg.  She feels better but has edema.  We will increase her BB and let her take diuretic every other day.  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) 7)  Asthma Nos W/acute Exacerbation  (ICD-493.92) 8)  Hypersomnia  (ICD-780.54) 9)  Routine General Medical Exam@[facility]  (ICD-V70.0) 10)  Depressive Disorder  (123456) 11)  Diastolic Dysfunction  (123XX123) 12)  Pyuria  (ICD-791.9) 13)  Abnormal Electrocardiogram  (ICD-794.31) 14)  Paresthesia  (ICD-782.0) 15)  Family History Breast Cancer 1st Degree Relative <50  (ICD-V16.3) 16)  Renal Insufficiency  (ICD-588.9) 17)  Gerd  (ICD-530.81) 18)  COPD  (ICD-496) 19)  Obstructive Sleep Apnea  (ICD-327.23) 20)  Allergic Rhinitis  (ICD-477.9)  Current Medications  (verified): 1)  Zantac 300 Mg  Tabs (Ranitidine Hcl) .... Take 1 Tablet By Mouth Once A Day 2)  Allegra 180 Mg Tabs (Fexofenadine Hcl) .... Take 1 Tablet By Mouth Once A Day As Needed 3)  Cpap 5-15 Auto Titrate 4)  Luxiq 0.12 % Foam (Betamethasone Valerate) .... Apply To Scalp As Directed 5)  Vitamin D 2000 Unit Tabs (Cholecalciferol) .... Take 1 Tablet By Mouth Once A Day 6)  Mega Basic  Tabs (Multiple Vitamins-Minerals) .... Take 1 Tablet By Mouth Once A Day 7)  Symbicort 160-4.5 Mcg/act Aero (Budesonide-Formoterol Fumarate) .... 2 Puffs Two Times A Day For Asthma 8)  Tekturna 150 Mg Tabs (Aliskiren Fumarate) .Marland Kitchen.. 1 Once Daily 9)  Spironolactone-Hctz 25-25 Mg Tabs (Spironolactone-Hctz) .... Take 1 Tablet By Mouth Once A Day 10)  Cymbalta 30 Mg Cpep (Duloxetine Hcl) .... Take 1 Tablet By Mouth Once A Day 11)  Cetirizine Hcl 10 Mg Chew (Cetirizine Hcl) .... As Needed 12)  Carvedilol 6.25 Mg Tabs (Carvedilol) .... Take One Tablet By Mouth Twice A Day 13)  Spironolactone-Hctz 25-25 Mg Tabs (Spironolactone-Hctz) .... One Tablet Once Daily  Allergies: 1)  ! Sulfa 2)  ! Nsaids 3)  ! Azor  Past History:  Past Medical History: Last updated: 05/21/2010 CHEST PAIN  HEART MURMUR, SYSTOLIC  HYPERTENSION  HYPERPOTASSEMIA  ASTHMA NOS W/ACUTE EXACERBATION  HYPERSOMNIA  ROUTINE GENERAL MEDICAL EXAM@[facility]ALTH  [facility]  DEPRESSIVE DISORDER  DIASTOLIC DYSFUNCTION PYURIA  ABNORMAL ELECTROCARDIOGRAM  PARESTHESIA FAMILY HISTORY BREAST  CANCER 1ST DEGREE RELATIVE <50  RENAL INSUFFICIENCY GERD COPD OBSTRUCTIVE SLEEP APNEA  ALLERGIC RHINITIS Renal insufficiency Psoriasis  Past Surgical History: Last updated: 12/20/2009 Appendectomy Cholecystectomy Hysterectomy Tonsillectomy C-Section  Family History: Last updated: 12/20/2009 Dementia Family History Breast cancer 1st degree relative <50 Family History Hypertension No FH of Colon Cancer: Family History of Ovarian Cancer: Mat  Aunt Family History of Bladder Cancer: Father Family History of Heart Disease: MGF, PGF  Social History: Last updated: 12/20/2009 Married Never Smoked Alcohol use-no Drug use-no Regular exercise-no Daily Caffeine Use  Review of Systems       Denies fever, malais, weight loss, blurry vision, decreased visual acuity, cough, sputum, SOB, hemoptysis, pleuritic pain, palpitaitons, heartburn, abdominal pain, melena, , claudication, or rash.   Vital Signs:  Patient profile:   70 year old female Menstrual status:  hysterectomy Height:      63 inches Weight:      195 pounds BMI:     34.67 Pulse rate:   64 / minute Pulse rhythm:   regular Resp:     18 per minute BP sitting:   128 / 70  (left arm) Cuff size:   large  Vitals Entered By: Sidney Ace (July 18, 2010 10:17 AM)  Physical Exam  General:  Affect appropriate Healthy:  appears stated age HEENT: normal Neck supple with no adenopathy JVP normal no bruits no thyromegaly Lungs clear with no wheezing and good diaphragmatic motion Heart:  S1/S2 HOCM  murmur worse with valsalva  no ,rub, gallop or click PMI normal Abdomen: benighn, BS positve, no tenderness, no AAA no bruit.  No HSM or HJR Distal pulses intact with no bruits Plus one  edema Neuro non-focal Skin warm and dry    Impression & Recommendations:  Problem # 1:  CARDIOMYOPATHY, HYPERTROPHIC (ICD-425.1) Less symptomatic on BB increase dose The following medications were removed from the medication list:    Carvedilol 3.125 Mg Tabs (Carvedilol) .Marland Kitchen... 1 two times a day Her updated medication list for this problem includes:    Spironolactone-hctz 25-25 Mg Tabs (Spironolactone-hctz) .Marland Kitchen... Take 1 tablet by mouth once a day    Carvedilol 6.25 Mg Tabs (Carvedilol) .Marland Kitchen... Take one tablet by mouth twice a day    Spironolactone-hctz 25-25 Mg Tabs (Spironolactone-hctz) ..... One tablet once daily  Problem # 2:  CHEST PAIN (ICD-786.50) Non recurrent normal myovue  Observe The following medications were removed from the medication list:    Carvedilol 3.125 Mg Tabs (Carvedilol) .Marland Kitchen... 1 two times a day Her updated medication list for this problem includes:    Carvedilol 6.25 Mg Tabs (Carvedilol) .Marland Kitchen... Take one tablet by mouth twice a day  Problem # 3:  HYPERTENSION (ICD-401.9) Well controlled The following medications were removed from the medication list:    Carvedilol 3.125 Mg Tabs (Carvedilol) .Marland Kitchen... 1 two times a day Her updated medication list for this problem includes:    Tekturna 150 Mg Tabs (Aliskiren fumarate) .Marland Kitchen... 1 once daily    Spironolactone-hctz 25-25 Mg Tabs (Spironolactone-hctz) .Marland Kitchen... Take 1 tablet by mouth once a day    Carvedilol 6.25 Mg Tabs (Carvedilol) .Marland Kitchen... Take one tablet by mouth twice a day    Spironolactone-hctz 25-25 Mg Tabs (Spironolactone-hctz) ..... One tablet once daily  Problem # 4:  EDEMA (ICD-782.3) Low dose diuretic every other day Avoid over diuresis given HOCM with elicitable murmur  Patient Instructions: 1)  Your physician recommends that you schedule a follow-up appointment in: 3 MONTHS 2)  Your physician has recommended  you make the following change in your medication: INCREASE CARVEDALOL 6.25MG  TWICE DAILY 3)  RESTART SPIRONOLACTONE/HCTZ 25/25MG  TAKE ONE TABLET EVERYOTHER DAY Prescriptions: SPIRONOLACTONE-HCTZ 25-25 MG TABS (SPIRONOLACTONE-HCTZ) ONE TABLET ONCE DAILY  #30 x 12   Entered by:   Fredia Beets, RN   Authorized by:   Bosie Clos, MD, [facility]   Signed by:   Fredia Beets, RN on 07/18/2010   Method used:   Electronically to        [facility] [id number]* (retail)       [place], [place]  [address]       Ph: [phone]       Fax: [phone]   RxID:   [id number] CARVEDILOL 6.25 MG TABS (CARVEDILOL) Take one tablet by mouth twice a day  #60 x 12   Entered by:   Fredia Beets, RN   Authorized by:   Bosie Clos, MD, [facility]   Signed by:   Fredia Beets, RN on 07/18/2010   Method used:   Electronically to        [facility] [id number]* (retail)       [address]       [place], [place]  [address]       Ph: [phone]       Fax: [phone]   RxID:   [id number]

## 2010-09-05 NOTE — Assessment & Plan Note (Signed)
Summary: rov for osa, EDS   Copy to:  na Primary Provider/Referring Provider:  Scarlette Calico, MD  CC:  1 year f/u appt OSA.  states she wears her cpap machine every night.  approx 6 to 8 hours per night.  denies problems with mask or pressure.  Marland Kitchen  History of Present Illness: the pt comes in today for f/u of her known osa with EDS.  She has had documented compliance with her cpap in the past, along with adequate sleep hygiene, but continues to have sleepiness at times during the day.  Her weight is up 11 pounds since the last visit.  She denies any issues with cpap pressure, mask fit, or with her sleep in general at night.  What is concerning is ongoing issues with sleepiness while driving at times.  I have recommended a mslt to further evaluate, but the pt has not wanted to this due to expense.    Current Medications (verified): 1)  Zantac 300 Mg  Tabs (Ranitidine Hcl) .... Take 1 Tablet By Mouth Once A Day 2)  Allegra 180 Mg Tabs (Fexofenadine Hcl) .... Take 1 Tablet By Mouth Once A Day As Needed 3)  Cpap 5-15 Auto Titrate 4)  Luxiq 0.12 % Foam (Betamethasone Valerate) .... Apply To Scalp As Directed 5)  Vitamin D 2000 Unit Tabs (Cholecalciferol) .... Take 1 Tablet By Mouth Once A Day 6)  Mega Basic  Tabs (Multiple Vitamins-Minerals) .... Take 1 Tablet By Mouth Once A Day 7)  Symbicort 160-4.5 Mcg/act Aero (Budesonide-Formoterol Fumarate) .... 2 Puffs Two Times A Day For Asthma 8)  Tekturna 150 Mg Tabs (Aliskiren Fumarate) .Marland Kitchen.. 1 Once Daily 9)  Cymbalta 30 Mg Cpep (Duloxetine Hcl) .... Take 1 Tablet By Mouth Once A Day 10)  Cetirizine Hcl 10 Mg Chew (Cetirizine Hcl) .... As Needed 11)  Carvedilol 6.25 Mg Tabs (Carvedilol) .... Take One Tablet By Mouth Twice A Day 12)  Spironolactone-Hctz 25-25 Mg Tabs (Spironolactone-Hctz) .... One Tablet Once Daily  Allergies (verified): 1)  ! Sulfa 2)  ! Nsaids 3)  ! Azor  Past History:  Past medical, surgical, family and social histories (including  risk factors) reviewed, and no changes noted (except as noted below).  Past Medical History: CHEST PAIN  HEART MURMUR, SYSTOLIC  HYPERTENSION  HYPERPOTASSEMIA  ASTHMA NOS W/ACUTE EXACERBATION  HYPERSOMNIA  ROUTINE GENERAL MEDICAL EXAM@HEALTH  CARE FACL  DEPRESSIVE DISORDER  DIASTOLIC DYSFUNCTION PYURIA  ABNORMAL ELECTROCARDIOGRAM  PARESTHESIA FAMILY HISTORY BREAST CANCER 1ST DEGREE RELATIVE <50  RENAL INSUFFICIENCY GERD COPD OBSTRUCTIVE SLEEP APNEA --persistent daytime sleepiness despite cpap ALLERGIC RHINITIS Renal insufficiency Psoriasis  Past Surgical History: Reviewed history from 12/20/2009 and no changes required. Appendectomy Cholecystectomy Hysterectomy Tonsillectomy C-Section  Family History: Reviewed history from 12/20/2009 and no changes required. Dementia Family History Breast cancer 1st degree relative <50 Family History Hypertension No FH of Colon Cancer: Family History of Ovarian Cancer: Mat Aunt Family History of Bladder Cancer: Father Family History of Heart Disease: MGF, PGF  Social History: Reviewed history from 12/20/2009 and no changes required. Married Never Smoked Alcohol use-no Drug use-no Regular exercise-no Daily Caffeine Use  Review of Systems       The patient complains of shortness of breath with activity, productive cough, non-productive cough, irregular heartbeats, sore throat, nasal congestion/difficulty breathing through nose, sneezing, itching, ear ache, hand/feet swelling, and joint stiffness or pain.  The patient denies shortness of breath at rest, coughing up blood, chest pain, acid heartburn, indigestion, loss of appetite, weight change,  abdominal pain, difficulty swallowing, tooth/dental problems, headaches, anxiety, depression, rash, change in color of mucus, and fever.    Vital Signs:  Patient profile:   70 year old female Menstrual status:  hysterectomy Height:      63 inches Weight:      189.50 pounds BMI:      33.69 O2 Sat:      92 % on Room air Temp:     98.2 degrees F oral Pulse rate:   70 / minute BP sitting:   110 / 68  (left arm) Cuff size:   large  Vitals Entered By: Matthew Folks LPN (January 12, [id number] 11:09 AM)  O2 Flow:  Room air CC: 1 year f/u appt OSA.  states she wears her cpap machine every night.  approx 6 to 8 hours per night.  denies problems with mask or pressure.   Comments Medications reviewed with patient Matthew Folks LPN  January 12, [id number] 11:09 AM    Physical Exam  General:  obese female in nad Nose:  no skin breakdown or pressure necrosis from cpap mask Heart:  rrr Extremities:  mild edema, no cyanosis  Neurologic:  alert and oriented, moves all 4.   Impression & Recommendations:  Problem # 1:  OBSTRUCTIVE SLEEP APNEA (ICD-327.23) the pt has known osa, and is wearing cpap compliantly without issue.  She feels she sleeps well at night, and is getting enough sleep.  Her weight is up some since the last visit, but not significantly so.  I have asked her to work on weight loss aggressively, and to continue with her current cpap.  Problem # 2:  HYPERSOMNIA (ICD-780.54) I suspect she is in the subset of pts that have persistent daytime sleepiness despite adequate treatment of her osa.  This usually improves with weight loss.  I do not think she has narcolepsy or idiopathic hypersomnia, but I am concerned about her sleepiness with driving.  Will give her a trial of nuvigil, to take only on days she has to do a lot of driving.    Other Orders: Est. Patient Level IV [id number])  Patient Instructions: 1)  continue to work on weight loss.  You are doing well 2)  continue cpap 3)  can try nuvigil 150mg  each am that you are going to be doing significant driving. 4)  followup with me in one year.

## 2010-09-05 NOTE — Assessment & Plan Note (Signed)
Summary: f/u appt / cd   Vital Signs:  Patient profile:   70 year old female Menstrual status:  hysterectomy Height:      63 inches Weight:      194 pounds BMI:     34.49 O2 Sat:      96 % on Room air Temp:     97.9 degrees F oral Pulse rate:   63 / minute Pulse rhythm:   regular Resp:     16 per minute BP sitting:   122 / 64  (left arm) Cuff size:   large  Vitals Entered By: Estell Harpin CMA (July 17, 2010 3:49 PM)  Nutrition Counseling: Patient's BMI is greater than 25 and therefore counseled on weight management options.  O2 Flow:  Room air CC: follow-up visit, Depressive symptoms Is Patient Diabetic? No Pain Assessment Patient in pain? no       Does patient need assistance? Functional Status Self care Ambulation Normal   Primary Care Dama Hedgepeth:  Scarlette Calico, MD  CC:  follow-up visit and Depressive symptoms.  History of Present Illness:  Depressive Symptoms      This is a 70 year old woman who presents with Depressive symptoms.  The symptoms began 4-8 weeks ago.  The severity is described as mild.  The patient reports depressed mood, loss of interest/pleasure, significant weight gain, hypersomnia, and psychomotor retardation, but denies significant weight loss, insomnia, and psychomotor agitation.  The patient also reports fatigue or loss of energy.  The patient denies feelings of worthlessness, diminished concentration, indecisiveness, thoughts of death, thoughts of suicide, suicidal intent, and suicidal plans.  The patient reports the following psychosocial stressors: major life changes.  Patient's past history includes depression.  The patient denies abnormally elevated mood, abnormally irritable mood, decreased need for sleep, increased talkativeness, distractibility, flight of ideas, increased goal-directed activity, and inflated self-esteem/ grandiosity.    Hypertension Follow-Up      The patient also presents for Hypertension follow-up.  The patient  denies lightheadedness, urinary frequency, headaches, edema, and fatigue.  The patient denies the following associated symptoms: chest pain, chest pressure, exercise intolerance, dyspnea, palpitations, syncope, leg edema, and pedal edema.  Compliance with medications (by patient report) has been near 100%.  The patient reports that dietary compliance has been good.  The patient reports no exercise.  Adjunctive measures currently used by the patient include salt restriction and relaxation.    Preventive Screening-Counseling & Management  Alcohol-Tobacco     Alcohol drinks/day: 0     Alcohol Counseling: not indicated; patient does not drink     Smoking Status: never     Passive Smoke Exposure: no     Tobacco Counseling: not indicated; no tobacco use  Hep-HIV-STD-Contraception     Hepatitis Risk: no risk noted     HIV Risk: no     STD Risk: no risk noted     SBE monthly: yes     SBE Education/Counseling: to perform regular SBE      Sexual History:  currently monogamous.        Drug Use:  never.        Blood Transfusions:  no.    Clinical Review Panels:  Prevention   Last Mammogram:  Normal Bilateral (08/23/2009)   Last Colonoscopy:  DONE (01/30/2010)  Immunizations   Last Flu Vaccine:  Fluvax 3+ (04/24/2010)   Last Pneumovax:  Pneumovax (08/10/2008)  Lipid Management   Cholesterol:  148 (10/31/2009)   LDL (bad choesterol):  81 (10/31/2009)  HDL (good cholesterol):  43.80 (10/31/2009)  Diabetes Management   Creatinine:  1.4 (05/15/2010)   Last Flu Vaccine:  Fluvax 3+ (04/24/2010)   Last Pneumovax:  Pneumovax (08/10/2008)  CBC   WBC:  6.0 (10/31/2009)   RBC:  4.34 (10/31/2009)   Hgb:  13.1 (10/31/2009)   Hct:  39.2 (10/31/2009)   Platelets:  174.0 (10/31/2009)   MCV  90.4 (10/31/2009)   MCHC  33.4 (10/31/2009)   RDW  12.3 (10/31/2009)   PMN:  73.9 (10/31/2009)   Lymphs:  14.6 (10/31/2009)   Monos:  8.1 (10/31/2009)   Eosinophils:  2.5 (10/31/2009)   Basophil:  0.9  (10/31/2009)  Complete Metabolic Panel   Glucose:  109 (05/15/2010)   Sodium:  137 (05/15/2010)   Potassium:  4.4 (05/15/2010)   Chloride:  100 (05/15/2010)   CO2:  30 (05/15/2010)   BUN:  26 (05/15/2010)   Creatinine:  1.4 (05/15/2010)   Albumin:  3.9 (10/31/2009)   Total Protein:  8.2 (10/31/2009)   Calcium:  9.7 (05/15/2010)   Total Bili:  0.9 (10/31/2009)   Alk Phos:  109 (10/31/2009)   SGPT (ALT):  22 (10/31/2009)   SGOT (AST):  24 (10/31/2009)   Medications Prior to Update: 1)  Zantac 300 Mg  Tabs (Ranitidine Hcl) .... Take 1 Tablet By Mouth Once A Day 2)  Allegra 180 Mg Tabs (Fexofenadine Hcl) .... Take 1 Tablet By Mouth Once A Day As Needed 3)  Cpap 5-15 Auto Titrate 4)  Luxiq 0.12 % Foam (Betamethasone Valerate) .... Apply To Scalp As Directed 5)  Vitamin D 2000 Unit Tabs (Cholecalciferol) .... Take 1 Tablet By Mouth Once A Day 6)  Lexapro 10 Mg Tabs (Escitalopram Oxalate) .... Take 1 Tablet By Mouth Once A Day 7)  Mega Basic  Tabs (Multiple Vitamins-Minerals) .... Take 1 Tablet By Mouth Once A Day 8)  Symbicort 160-4.5 Mcg/act Aero (Budesonide-Formoterol Fumarate) .... 2 Puffs Two Times A Day For Asthma 9)  Tekturna 150 Mg Tabs (Aliskiren Fumarate) .Marland Kitchen.. 1 Once Daily 10)  Carvedilol 3.125 Mg Tabs (Carvedilol) .Marland Kitchen.. 1 Two Times A Day  Current Medications (verified): 1)  Zantac 300 Mg  Tabs (Ranitidine Hcl) .... Take 1 Tablet By Mouth Once A Day 2)  Allegra 180 Mg Tabs (Fexofenadine Hcl) .... Take 1 Tablet By Mouth Once A Day As Needed 3)  Cpap 5-15 Auto Titrate 4)  Luxiq 0.12 % Foam (Betamethasone Valerate) .... Apply To Scalp As Directed 5)  Vitamin D 2000 Unit Tabs (Cholecalciferol) .... Take 1 Tablet By Mouth Once A Day 6)  Mega Basic  Tabs (Multiple Vitamins-Minerals) .... Take 1 Tablet By Mouth Once A Day 7)  Symbicort 160-4.5 Mcg/act Aero (Budesonide-Formoterol Fumarate) .... 2 Puffs Two Times A Day For Asthma 8)  Tekturna 150 Mg Tabs (Aliskiren Fumarate) .Marland Kitchen.. 1  Once Daily 9)  Carvedilol 3.125 Mg Tabs (Carvedilol) .Marland Kitchen.. 1 Two Times A Day 10)  Cymbalta 30 Mg Cpep (Duloxetine Hcl) .... One By Mouth Once Daily  Allergies (verified): 1)  ! Sulfa 2)  ! Nsaids 3)  ! Azor  Past History:  Past Medical History: Last updated: 05/21/2010 CHEST PAIN  HEART MURMUR, SYSTOLIC  HYPERTENSION  HYPERPOTASSEMIA  ASTHMA NOS W/ACUTE EXACERBATION  HYPERSOMNIA  ROUTINE GENERAL MEDICAL EXAM@HEALTH  CARE FACL  DEPRESSIVE DISORDER  DIASTOLIC DYSFUNCTION PYURIA  ABNORMAL ELECTROCARDIOGRAM  PARESTHESIA FAMILY HISTORY BREAST CANCER 1ST DEGREE RELATIVE <50  RENAL INSUFFICIENCY GERD COPD OBSTRUCTIVE SLEEP APNEA  ALLERGIC RHINITIS Renal insufficiency Psoriasis  Past Surgical History:  Last updated: 12/20/2009 Appendectomy Cholecystectomy Hysterectomy Tonsillectomy C-Section  Family History: Last updated: 12/20/2009 Dementia Family History Breast cancer 1st degree relative <50 Family History Hypertension No FH of Colon Cancer: Family History of Ovarian Cancer: Mat Aunt Family History of Bladder Cancer: Father Family History of Heart Disease: MGF, PGF  Social History: Last updated: 12/20/2009 Married Never Smoked Alcohol use-no Drug use-no Regular exercise-no Daily Caffeine Use  Risk Factors: Alcohol Use: 0 (07/17/2010) Exercise: no (08/10/2008)  Risk Factors: Smoking Status: never (07/17/2010) Passive Smoke Exposure: no (07/17/2010)  Family History: Reviewed history from 12/20/2009 and no changes required. Dementia Family History Breast cancer 1st degree relative <50 Family History Hypertension No FH of Colon Cancer: Family History of Ovarian Cancer: Mat Aunt Family History of Bladder Cancer: Father Family History of Heart Disease: MGF, PGF  Social History: Reviewed history from 12/20/2009 and no changes required. Married Never Smoked Alcohol use-no Drug use-no Regular exercise-no Daily Caffeine Use  Review of Systems        The patient complains of weight gain and depression.  The patient denies anorexia, fever, chest pain, syncope, dyspnea on exertion, peripheral edema, prolonged cough, headaches, hemoptysis, abdominal pain, hematuria, suspicious skin lesions, difficulty walking, and unusual weight change.   Psych:  Complains of depression, easily angered, easily tearful, and irritability; denies anxiety, mental problems, panic attacks, sense of great danger, suicidal thoughts/plans, thoughts of violence, unusual visions or sounds, and thoughts /plans of harming others.  Physical Exam  General:  alert, well-developed, well-nourished, well-hydrated, appropriate dress, normal appearance, healthy-appearing, cooperative to examination, and overweight-appearing.   Head:  normocephalic, atraumatic, no abnormalities observed, and no abnormalities palpated.   Mouth:  Oral mucosa and oropharynx without lesions or exudates.  Teeth in good repair. Neck:  supple, full ROM, no masses, no thyromegaly, no thyroid nodules or tenderness, no JVD, normal carotid upstroke, no carotid bruits, no cervical lymphadenopathy, and no neck tenderness.   Lungs:  normal respiratory effort, no intercostal retractions, no accessory muscle use, normal breath sounds, no dullness, no fremitus, no crackles, and no wheezes.   Heart:  normal rate, regular rhythm, no gallop, no rub, no JVD, and Grade   1/6 systolic ejection murmur.   Abdomen:  soft, non-tender, normal bowel sounds, no distention, no masses, no guarding, no rigidity, no rebound tenderness, no abdominal hernia, no inguinal hernia, no hepatomegaly, and no splenomegaly.   Msk:  No deformity or scoliosis noted of thoracic or lumbar spine.   Pulses:  R and L carotid,radial,femoral,dorsalis pedis and posterior tibial pulses are full and equal bilaterally Extremities:  No clubbing, cyanosis, edema, or deformity noted with normal full range of motion of all joints.   Neurologic:  No cranial nerve  deficits noted. Station and gait are normal. Plantar reflexes are down-going bilaterally. DTRs are symmetrical throughout. Sensory, motor and coordinative functions appear intact. Skin:  turgor normal, color normal, no rashes, no suspicious lesions, no ecchymoses, no petechiae, no purpura, no ulcerations, and no edema.   Cervical Nodes:  no anterior cervical adenopathy and no posterior cervical adenopathy.   Axillary Nodes:  no R axillary adenopathy and no L axillary adenopathy.   Inguinal Nodes:  no R inguinal adenopathy and no L inguinal adenopathy.   Psych:  Cognition and judgment appear intact. Alert and cooperative with normal attention span and concentration. No apparent delusions, illusions, hallucinations   Impression & Recommendations:  Problem # 1:  HYPERTENSION (ICD-401.9) Assessment Improved  Her updated medication list for this problem includes:  Tekturna 150 Mg Tabs (Aliskiren fumarate) .Marland Kitchen... 1 once daily    Carvedilol 3.125 Mg Tabs (Carvedilol) .Marland Kitchen... 1 two times a day  Problem # 2:  DEPRESSIVE DISORDER (ICD-311) Assessment: Deteriorated  The following medications were removed from the medication list:    Lexapro 10 Mg Tabs (Escitalopram oxalate) .Marland Kitchen... Take 1 tablet by mouth once a day Her updated medication list for this problem includes:    Cymbalta 30 Mg Cpep (Duloxetine hcl) ..... One by mouth once daily  Discussed treatment options, including trial of antidpressant medication. Will refer to behavioral health. Follow-up call in in 24-48 hours and recheck in 2 weeks, sooner as needed. Patient agrees to call if any worsening of symptoms or thoughts of doing harm arise. Verified that the patient has no suicidal ideation at this time.   Problem # 3:  RENAL INSUFFICIENCY (ICD-588.9) Assessment: Unchanged  Complete Medication List: 1)  Zantac 300 Mg Tabs (Ranitidine hcl) .... Take 1 tablet by mouth once a day 2)  Allegra 180 Mg Tabs (Fexofenadine hcl) .... Take 1 tablet  by mouth once a day as needed 3)  Cpap 5-15 Auto Titrate  4)  Luxiq 0.12 % Foam (Betamethasone valerate) .... Apply to scalp as directed 5)  Vitamin D 2000 Unit Tabs (Cholecalciferol) .... Take 1 tablet by mouth once a day 6)  Mega Basic Tabs (Multiple vitamins-minerals) .... Take 1 tablet by mouth once a day 7)  Symbicort 160-4.5 Mcg/act Aero (Budesonide-formoterol fumarate) .... 2 puffs two times a day for asthma 8)  Tekturna 150 Mg Tabs (Aliskiren fumarate) .Marland Kitchen.. 1 once daily 9)  Carvedilol 3.125 Mg Tabs (Carvedilol) .Marland Kitchen.. 1 two times a day 10)  Cymbalta 30 Mg Cpep (Duloxetine hcl) .... One by mouth once daily  Patient Instructions: 1)  Please schedule a follow-up appointment in 4 months. 2)  It is important that you exercise regularly at least 20 minutes 5 times a week. If you develop chest pain, have severe difficulty breathing, or feel very tired , stop exercising immediately and seek medical attention. 3)  You need to lose weight. Consider a lower calorie diet and regular exercise.  4)  Check your Blood Pressure regularly. If it is above 130/80: you should make an appointment. Prescriptions: CYMBALTA 30 MG CPEP (DULOXETINE HCL) One by mouth once daily  #70 x 0   Entered and Authorized by:   Janith Lima MD   Signed by:   Janith Lima MD on 07/17/2010   Method used:   Samples Given   RxID:   [id number]    Orders Added: 1)  Est. Patient Level IV [id number]

## 2010-09-06 NOTE — Letter (Signed)
Summary: CMN  CMN   Imported By: Mateo Flow 11/29/2009 16:56:17  _____________________________________________________________________  External Attachment:    Type:   Image     Comment:   External Document

## 2010-09-09 ENCOUNTER — Telehealth: Payer: Self-pay | Admitting: Internal Medicine

## 2010-09-11 NOTE — Progress Notes (Signed)
    Mammogram Screening:    Last Mammogram:  08/29/2010  Mammogram Results:    Date of Exam:  08/29/2010    Results:  Normal Bilateral  Osteoporosis Risk Assessment:  Risk Factors for Fracture or Low Bone Density:   Race (White or Asian):     yes   Smoking status:       never  Immunization & Chemoprophylaxis:    Influenza vaccine: Fluvax 3+  (04/24/2010)    Pneumovax: Pneumovax  (08/10/2008)

## 2010-09-11 NOTE — Progress Notes (Signed)
  Prescriptions: CYMBALTA 30 MG CPEP (DULOXETINE HCL) Take 1 tablet by mouth once a day  #30 x 11   Entered and Authorized by:   Janith Lima MD   Signed by:   Janith Lima MD on 09/03/2010   Method used:   Electronically to        [facility] # [address]* (retail)       [place], [place]  [address]       Ph: [phone]       Fax: [phone]   RxID:   [id number]

## 2010-09-12 ENCOUNTER — Telehealth (INDEPENDENT_AMBULATORY_CARE_PROVIDER_SITE_OTHER): Payer: Self-pay | Admitting: *Deleted

## 2010-09-12 ENCOUNTER — Encounter: Payer: Self-pay | Admitting: Internal Medicine

## 2010-09-12 ENCOUNTER — Ambulatory Visit (INDEPENDENT_AMBULATORY_CARE_PROVIDER_SITE_OTHER): Payer: Medicare Other | Admitting: Internal Medicine

## 2010-09-12 DIAGNOSIS — B354 Tinea corporis: Secondary | ICD-10-CM

## 2010-09-12 DIAGNOSIS — I10 Essential (primary) hypertension: Secondary | ICD-10-CM

## 2010-09-19 NOTE — Progress Notes (Signed)
Summary: Prescription Nuvigil<<<sent to pharmacy  Phone Note Call from Patient Call back at Home Phone [phone]   Caller: Patient Summary of Call: Patient tried samples of Nuvigil and wants prescription.  Only takes when she is driving long distances.  Pharmacy is [facility]) [phone]. Initial call taken by: Jacqualine Code,  September 12, 2010 9:20 AM  Follow-up for Phone Call        Spoke with pt.  She states that nuvigil works well when she has to take to drive long distances.  Would like an rx called to pharm.  Pls advise if this is okay thanks! Follow-up by: Tilden Dome,  September 12, 2010 9:47 AM  Additional Follow-up for Phone Call Additional follow up Details #1::        ok to send prescription: one by mouth once daily as needed sleepiness. #30, 6 fills.  Additional Follow-up by: Kathee Delton MD,  September 12, 2010 1:09 PM    Additional Follow-up for Phone Call Additional follow up Details #2::    LMOMto let pt know RX has been called to her pharmacy and to contact the pharmacy first before going to pick this medication up to make sure they have it ready for her and to call our office if she has any furhter questions or problems.Francesca Jewett CMA  September 12, 2010 2:06 PM  New/Updated Medications: NUVIGIL 150 MG TABS (ARMODAFINIL) 1 by mouth daily as needed for sleepiness Prescriptions: NUVIGIL 150 MG TABS (ARMODAFINIL) 1 by mouth daily as needed for sleepiness  #30 x 5   Entered by:   Francesca Jewett CMA   Authorized by:   Kathee Delton MD   Signed by:   Francesca Jewett CMA on 09/12/2010   Method used:   Telephoned to ...       [facility] # [id number] (retail)       [place], [place]  [address]       Ph: [phone]       Fax: [phone]   RxID:   [id number]

## 2010-09-19 NOTE — Assessment & Plan Note (Signed)
Summary: rash/only wants to see Niralya Ohanian/SD   Vital Signs:  Patient profile:   70 year old female Menstrual status:  hysterectomy Height:      63 inches Weight:      188 pounds BMI:     33.69 O2 Sat:      98 % on Room air Temp:     97.6 degrees F oral Pulse rate:   70 / minute Pulse rhythm:   regular Resp:     16 per minute BP sitting:   110 / 60  (left arm) Cuff size:   large  Vitals Entered By: [place] (September 12, 2010 8:59 AM)  Nutrition Counseling: Patient's BMI is greater than 25 and therefore counseled on weight management options.  O2 Flow:  Room air CC: Patient c/o rash under L arm x 09/07/10, Hypertension Management Is Patient Diabetic? No Pain Assessment Patient in pain? no       Does patient need assistance? Functional Status Self care Ambulation Normal   Primary Care Provider:  Scarlette Calico, MD  CC:  Patient c/o rash under L arm x 09/07/10 and Hypertension Management.  History of Present Illness: She returns c/o itchy rash in armpits and in her groin for 5 days. She denies any new contacts or exposures.  Hypertension History:      She denies headache, chest pain, palpitations, dyspnea with exertion, orthopnea, PND, peripheral edema, visual symptoms, neurologic problems, syncope, and side effects from treatment.  She notes no problems with any antihypertensive medication side effects.        Positive major cardiovascular risk factors include female age 7 years old or older and hypertension.  Negative major cardiovascular risk factors include no history of diabetes or hyperlipidemia, negative family history for ischemic heart disease, and non-tobacco-user status.        Positive history for target organ damage include hypertensive retinopathy.  Further assessment for target organ damage reveals no history of ASHD, cardiac end-organ damage (CHF/LVH), stroke/TIA, peripheral vascular disease, or renal insufficiency.     Preventive Screening-Counseling &  Management  Alcohol-Tobacco     Alcohol drinks/day: 0     Alcohol Counseling: not indicated; patient does not drink     Smoking Status: never     Passive Smoke Exposure: no     Tobacco Counseling: not indicated; no tobacco use  Hep-HIV-STD-Contraception     Hepatitis Risk: no risk noted     HIV Risk: no     STD Risk: no risk noted     SBE monthly: yes     SBE Education/Counseling: to perform regular SBE      Sexual History:  currently monogamous.        Drug Use:  never.        Blood Transfusions:  no.    Clinical Review Panels:  Prevention   Last Mammogram:  Normal Bilateral (08/29/2010)   Last Colonoscopy:  DONE (01/30/2010)  Immunizations   Last Flu Vaccine:  Fluvax 3+ (04/24/2010)   Last Pneumovax:  Pneumovax (08/10/2008)  Lipid Management   Cholesterol:  148 (10/31/2009)   LDL (bad choesterol):  81 (10/31/2009)   HDL (good cholesterol):  43.80 (10/31/2009)  Diabetes Management   Creatinine:  1.4 (05/15/2010)   Last Flu Vaccine:  Fluvax 3+ (04/24/2010)   Last Pneumovax:  Pneumovax (08/10/2008)  CBC   WBC:  6.0 (10/31/2009)   RBC:  4.34 (10/31/2009)   Hgb:  13.1 (10/31/2009)   Hct:  39.2 (10/31/2009)   Platelets:  174.0 (10/31/2009)   MCV  90.4 (10/31/2009)   MCHC  33.4 (10/31/2009)   RDW  12.3 (10/31/2009)   PMN:  73.9 (10/31/2009)   Lymphs:  14.6 (10/31/2009)   Monos:  8.1 (10/31/2009)   Eosinophils:  2.5 (10/31/2009)   Basophil:  0.9 (10/31/2009)  Complete Metabolic Panel   Glucose:  109 (05/15/2010)   Sodium:  137 (05/15/2010)   Potassium:  4.4 (05/15/2010)   Chloride:  100 (05/15/2010)   CO2:  30 (05/15/2010)   BUN:  26 (05/15/2010)   Creatinine:  1.4 (05/15/2010)   Albumin:  3.9 (10/31/2009)   Total Protein:  8.2 (10/31/2009)   Calcium:  9.7 (05/15/2010)   Total Bili:  0.9 (10/31/2009)   Alk Phos:  109 (10/31/2009)   SGPT (ALT):  22 (10/31/2009)   SGOT (AST):  24 (10/31/2009)   Medications Prior to Update: 1)  Zantac 300 Mg  Tabs  (Ranitidine Hcl) .... Take 1 Tablet By Mouth Once A Day 2)  Allegra 180 Mg Tabs (Fexofenadine Hcl) .... Take 1 Tablet By Mouth Once A Day As Needed 3)  Cpap 5-15 Auto Titrate 4)  Luxiq 0.12 % Foam (Betamethasone Valerate) .... Apply To Scalp As Directed 5)  Vitamin D 2000 Unit Tabs (Cholecalciferol) .... Take 1 Tablet By Mouth Once A Day 6)  Mega Basic  Tabs (Multiple Vitamins-Minerals) .... Take 1 Tablet By Mouth Once A Day 7)  Symbicort 160-4.5 Mcg/act Aero (Budesonide-Formoterol Fumarate) .... 2 Puffs Two Times A Day For Asthma 8)  Tekturna 150 Mg Tabs (Aliskiren Fumarate) .Marland Kitchen.. 1 Once Daily 9)  Cymbalta 30 Mg Cpep (Duloxetine Hcl) .... Take 1 Tablet By Mouth Once A Day 10)  Cetirizine Hcl 10 Mg Chew (Cetirizine Hcl) .... As Needed 11)  Carvedilol 6.25 Mg Tabs (Carvedilol) .... Take One Tablet By Mouth Twice A Day 12)  Spironolactone-Hctz 25-25 Mg Tabs (Spironolactone-Hctz) .... One Tablet Once Daily  Current Medications (verified): 1)  Zantac 300 Mg  Tabs (Ranitidine Hcl) .... Take 1 Tablet By Mouth Once A Day 2)  Allegra 180 Mg Tabs (Fexofenadine Hcl) .... Take 1 Tablet By Mouth Once A Day As Needed 3)  Cpap 5-15 Auto Titrate 4)  Luxiq 0.12 % Foam (Betamethasone Valerate) .... Apply To Scalp As Directed 5)  Vitamin D 2000 Unit Tabs (Cholecalciferol) .... Take 1 Tablet By Mouth Once A Day 6)  Mega Basic  Tabs (Multiple Vitamins-Minerals) .... Take 1 Tablet By Mouth Once A Day 7)  Symbicort 160-4.5 Mcg/act Aero (Budesonide-Formoterol Fumarate) .... 2 Puffs Two Times A Day For Asthma 8)  Cymbalta 30 Mg Cpep (Duloxetine Hcl) .... Take 1 Tablet By Mouth Once A Day 9)  Cetirizine Hcl 10 Mg Chew (Cetirizine Hcl) .... As Needed 10)  Carvedilol 6.25 Mg Tabs (Carvedilol) .... Take One Tablet By Mouth Twice A Day 11)  Spironolactone-Hctz 25-25 Mg Tabs (Spironolactone-Hctz) .... One Tablet Every Other Day 12)  Ketoconazole 2 % Crea (Ketoconazole) .... Apply To Aa Two Times A Day For 14  Days  Allergies (verified): 1)  ! Sulfa 2)  ! Nsaids 3)  ! Azor  Past History:  Past Medical History: Last updated: 08/15/2010 CHEST PAIN  HEART MURMUR, SYSTOLIC  HYPERTENSION  HYPERPOTASSEMIA  ASTHMA NOS W/ACUTE EXACERBATION  HYPERSOMNIA  ROUTINE GENERAL MEDICAL EXAM@HEALTH  CARE FACL  DEPRESSIVE DISORDER  DIASTOLIC DYSFUNCTION PYURIA  ABNORMAL ELECTROCARDIOGRAM  PARESTHESIA FAMILY HISTORY BREAST CANCER 1ST DEGREE RELATIVE <50  RENAL INSUFFICIENCY GERD COPD OBSTRUCTIVE SLEEP APNEA --persistent daytime sleepiness despite cpap ALLERGIC RHINITIS  Renal insufficiency Psoriasis  Past Surgical History: Last updated: 12/20/2009 Appendectomy Cholecystectomy Hysterectomy Tonsillectomy C-Section  Family History: Last updated: 12/20/2009 Dementia Family History Breast cancer 1st degree relative <50 Family History Hypertension No FH of Colon Cancer: Family History of Ovarian Cancer: Mat Aunt Family History of Bladder Cancer: Father Family History of Heart Disease: MGF, PGF  Social History: Last updated: 12/20/2009 Married Never Smoked Alcohol use-no Drug use-no Regular exercise-no Daily Caffeine Use  Risk Factors: Alcohol Use: 0 (09/12/2010) Exercise: no (08/10/2008)  Risk Factors: Smoking Status: never (09/12/2010) Passive Smoke Exposure: no (09/12/2010)  Family History: Reviewed history from 12/20/2009 and no changes required. Dementia Family History Breast cancer 1st degree relative <50 Family History Hypertension No FH of Colon Cancer: Family History of Ovarian Cancer: Mat Aunt Family History of Bladder Cancer: Father Family History of Heart Disease: MGF, PGF  Social History: Reviewed history from 12/20/2009 and no changes required. Married Never Smoked Alcohol use-no Drug use-no Regular exercise-no Daily Caffeine Use  Review of Systems  The patient denies anorexia, fever, weight loss, weight gain, chest pain, prolonged cough, headaches,  hemoptysis, abdominal pain, hematuria, difficulty walking, depression, unusual weight change, abnormal bleeding, and enlarged lymph nodes.   Derm:  Complains of itching and rash; denies changes in color of skin, changes in nail beds, dryness, excessive perspiration, flushing, hair loss, insect bite(s), lesion(s), and poor wound healing.  Physical Exam  General:  alert, well-developed, well-nourished, well-hydrated, appropriate dress, normal appearance, healthy-appearing, cooperative to examination, and overweight-appearing.   Head:  normocephalic, atraumatic, no abnormalities observed, and no abnormalities palpated.   Mouth:  Oral mucosa and oropharynx without lesions or exudates.  Teeth in good repair. Neck:  supple, full ROM, no masses, no thyromegaly, no thyroid nodules or tenderness, no JVD, normal carotid upstroke, no carotid bruits, no cervical lymphadenopathy, and no neck tenderness.   Lungs:  normal respiratory effort, no intercostal retractions, no accessory muscle use, normal breath sounds, no dullness, no fremitus, no crackles, and no wheezes.   Heart:  normal rate, regular rhythm, no gallop, no rub, no JVD, and Grade   1/6 systolic ejection murmur.   Abdomen:  soft, non-tender, normal bowel sounds, no distention, no masses, no guarding, no rigidity, no rebound tenderness, no abdominal hernia, no inguinal hernia, no hepatomegaly, and no splenomegaly.   Msk:  No deformity or scoliosis noted of thoracic or lumbar spine.   Pulses:  R and L carotid,radial,femoral,dorsalis pedis and posterior tibial pulses are full and equal bilaterally Extremities:  No clubbing, cyanosis, edema, or deformity noted with normal full range of motion of all joints.   Neurologic:  No cranial nerve deficits noted. Station and gait are normal. Plantar reflexes are down-going bilaterally. DTRs are symmetrical throughout. Sensory, motor and coordinative functions appear intact. Skin:  she has a symmetrical  erythematous, scaly in her axillae and groin. it is serpiginous and does not show any central clearing. Cervical Nodes:  no anterior cervical adenopathy and no posterior cervical adenopathy.   Axillary Nodes:  no R axillary adenopathy and no L axillary adenopathy.   Psych:  Cognition and judgment appear intact. Alert and cooperative with normal attention span and concentration. 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 1 once daily Her updated medication list for this problem includes:    Carvedilol 6.25 Mg Tabs (Carvedilol) .Marland Kitchen... Take one tablet by mouth twice a day    Spironolactone-hctz 25-25 Mg Tabs (Spironolactone-hctz) ..... One tablet every other day  BP today: 110/60 Prior BP: 110/68 (08/15/2010)  Prior 10 Yr Risk Heart Disease: 6 % (05/15/2010)  Labs Reviewed: K+: 4.4 (05/15/2010) Creat: : 1.4 (05/15/2010)   Chol: 148 (10/31/2009)   HDL: 43.80 (10/31/2009)   LDL: 81 (10/31/2009)   TG: 117.0 (10/31/2009)  Complete Medication List: 1)  Zantac 300 Mg Tabs (Ranitidine hcl) .... Take 1 tablet by mouth once a day 2)  Allegra 180 Mg Tabs (Fexofenadine hcl) .... Take 1 tablet by mouth once a day as needed 3)  Cpap 5-15 Auto Titrate  4)  Luxiq 0.12 % Foam (Betamethasone valerate) .... Apply to scalp as directed 5)  Vitamin D 2000 Unit Tabs (Cholecalciferol) .... Take 1 tablet by mouth once a day 6)  Mega Basic Tabs (Multiple vitamins-minerals) .... Take 1 tablet by mouth once a day 7)  Symbicort 160-4.5 Mcg/act Aero (Budesonide-formoterol fumarate) .... 2 puffs two times a day for asthma 8)  Cymbalta 30 Mg Cpep (Duloxetine hcl) .... Take 1 tablet by mouth  once a day 9)  Cetirizine Hcl 10 Mg Chew (Cetirizine hcl) .... As needed 10)  Carvedilol 6.25 Mg Tabs (Carvedilol) .... Take one tablet by mouth twice a day 11)  Spironolactone-hctz 25-25 Mg Tabs (Spironolactone-hctz) .... One tablet every other day 12)  Ketoconazole 2 % Crea (Ketoconazole) .... Apply to aa two times a day for 14 days  Hypertension Assessment/Plan:      The patient's hypertensive risk group is category C: Target organ damage and/or diabetes.  Her calculated 10 year risk of coronary heart disease is 6 %.  Today's blood pressure is 110/60.  Her blood pressure goal is < 140/90.  Patient Instructions: 1)  Please schedule a follow-up appointment in 1 month. 2)  It is important that you exercise regularly at least 20 minutes 5 times a week. If you develop chest pain, have severe difficulty breathing, or feel very tired , stop exercising immediately and seek medical attention. 3)  You need to lose weight. Consider a lower calorie diet and regular exercise.  4)  Check your Blood Pressure regularly. If it is above 130/80: you should make an appointment. Prescriptions: KETOCONAZOLE 2 % CREA (KETOCONAZOLE) Apply to AA two times a day for 14 days  #60 gms x 2   Entered and Authorized by:   Janith Lima MD   Signed by:   Janith Lima MD on 09/12/2010   Method used:   Electronically to        [facility] [phone] (retail)       [address], [place]  [address]       Ph: [phone]       Fax: [phone]   RxID:   [id number]    Orders Added: 1)  Est. Patient Level IV [id number]

## 2010-09-19 NOTE — Progress Notes (Signed)
Summary: NEEDS OV  Phone Note Call from Patient   Summary of Call: Pt left vm - rash under left arm, wanted to know if she needs ov. Pt needs office visit for eval.  Initial call taken by: Charlsie Quest, [place],  September 09, 2010 10:00 AM  Follow-up for Phone Call        Scheduled for first avail apt - would not see any other MD's Follow-up by: Charlsie Quest, CMA,  September 09, 2010 12:25 PM

## 2010-09-20 ENCOUNTER — Telehealth: Payer: Self-pay | Admitting: Pulmonary Disease

## 2010-09-20 ENCOUNTER — Telehealth (INDEPENDENT_AMBULATORY_CARE_PROVIDER_SITE_OTHER): Payer: Self-pay | Admitting: *Deleted

## 2010-09-25 ENCOUNTER — Encounter: Payer: Self-pay | Admitting: Pulmonary Disease

## 2010-09-25 NOTE — Progress Notes (Signed)
Summary: patient is in the lobby regarding nuvigil  Phone Note Call from Patient   Caller: Patient Call For: Judd Mccubbin Summary of Call: patient came by has sample of Nuvigil with only three pills left, she is going to [place] and will be driving she just left the pharmacy and they advised that they have not heard back on the authorization for this medication. Dr Gwenette Greet gave her two sample packs when she was here in Jan. She would like the status of the authorization because she needs it for travel do we have any more samples in case the authrization isnt complete by thursday. She is waiting in the lobby. Initial call taken by: Ozella Rocks,  September 20, 2010 3:51 PM  Follow-up for Phone Call        called the pharmacy and they stated that they faxed over PA for the nuvigil on 2/9---no papers here for the PA---had them refaxed to triage so this can be initiated today---will check with [facility] to see if we can give her a few samples to last until the PA is done. Elita Boone [facility]  September 20, 2010 4:03 PM   Additional Follow-up for Phone Call Additional follow up Details #1::        checked with [facility]---PA form has been filled out and faxed back to [facility] for approval---pt was given samples of the nuvigil 150mg    and she is aware that we will call her once the approval has been sent back to [place]  September 20, 2010 4:25 PM

## 2010-10-01 NOTE — Medication Information (Signed)
Summary: Visual merchandiser   Imported By: Adin Hector 09/25/2010 10:05:41  _____________________________________________________________________  External Attachment:    Type:   Image     Comment:   External Document

## 2010-10-01 NOTE — Progress Notes (Signed)
Summary: PA for nuvigil<<<APPROVED from 08/30/2010 to 09/20/2011  Phone Note Outgoing Call   Summary of Call: PA for nuvigil 150mg  has been filled out by [facility] and faxed back to [facility] for approval---pt is aware that we will call her once this approval has been received back [place]  September 20, 2010 4:26 PM   Follow-up for Phone Call        Nuvigil APPROVED per [employer] from 08-30-2010 to 09-20-2011.  Pt and pharmacy aware. Follow-up by: Francesca Jewett CMA,  September 24, 2010 10:52 AM

## 2010-10-02 ENCOUNTER — Ambulatory Visit (INDEPENDENT_AMBULATORY_CARE_PROVIDER_SITE_OTHER): Payer: Medicare Other | Admitting: Internal Medicine

## 2010-10-02 ENCOUNTER — Encounter: Payer: Self-pay | Admitting: Internal Medicine

## 2010-10-02 DIAGNOSIS — B354 Tinea corporis: Secondary | ICD-10-CM

## 2010-10-02 DIAGNOSIS — I10 Essential (primary) hypertension: Secondary | ICD-10-CM

## 2010-10-02 DIAGNOSIS — L259 Unspecified contact dermatitis, unspecified cause: Secondary | ICD-10-CM

## 2010-10-10 ENCOUNTER — Encounter: Payer: Self-pay | Admitting: Cardiovascular Disease

## 2010-10-10 NOTE — Assessment & Plan Note (Signed)
Summary: SPREADING RASH--REFUSED ANOTHER MD--D/T-STC   Vital Signs:  Patient profile:   70 year old female Menstrual status:  hysterectomy Height:      63 inches (160.02 cm) Weight:      190 pounds (86.36 kg) BMI:     33.78 O2 Sat:      96 % on Room air Temp:     97.6 degrees F (36.44 degrees C) oral Pulse rate:   75 / minute Pulse rhythm:   regular Resp:     14 per minute BP sitting:   132 / 64  (left arm) Cuff size:   regular  Vitals Entered By: Mackey Birchwood CMA(AAMA) (October 02, 2010 1:12 PM)  Nutrition Counseling: Patient's BMI is greater than 25 and therefore counseled on weight management options.  O2 Flow:  Room air CC: Pt c/o rash on left side axillary and leg that has radiated to torso intermittently/sls,cma Is Patient Diabetic? No Pain Assessment Patient in pain? no        Primary Care Provider:  Scarlette Calico, MD  CC:  Pt c/o rash on left side axillary and leg that has radiated to torso intermittently/sls and cma.  History of Present Illness: She returns c/o rash and itching in her armpits R>L and both groin areas. It got somewhat better with ketoconazole but still itches (severely.)  Preventive Screening-Counseling & Management  Alcohol-Tobacco     Alcohol drinks/day: 0     Alcohol Counseling: not indicated; patient does not drink     Smoking Status: never     Passive Smoke Exposure: no     Tobacco Counseling: not indicated; no tobacco use  Hep-HIV-STD-Contraception     Hepatitis Risk: no risk noted     HIV Risk: no     STD Risk: no risk noted     SBE monthly: yes     SBE Education/Counseling: to perform regular SBE      Sexual History:  currently monogamous.        Drug Use:  never.        Blood Transfusions:  no.    Clinical Review Panels:  Prevention   Last Mammogram:  Normal Bilateral (08/29/2010)   Last Colonoscopy:  DONE (01/30/2010)  Immunizations   Last Flu Vaccine:  Fluvax 3+ (04/24/2010)   Last Pneumovax:  Pneumovax  (08/10/2008)  Lipid Management   Cholesterol:  148 (10/31/2009)   LDL (bad choesterol):  81 (10/31/2009)   HDL (good cholesterol):  43.80 (10/31/2009)  Diabetes Management   Creatinine:  1.4 (05/15/2010)   Last Flu Vaccine:  Fluvax 3+ (04/24/2010)   Last Pneumovax:  Pneumovax (08/10/2008)  CBC   WBC:  6.0 (10/31/2009)   RBC:  4.34 (10/31/2009)   Hgb:  13.1 (10/31/2009)   Hct:  39.2 (10/31/2009)   Platelets:  174.0 (10/31/2009)   MCV  90.4 (10/31/2009)   MCHC  33.4 (10/31/2009)   RDW  12.3 (10/31/2009)   PMN:  73.9 (10/31/2009)   Lymphs:  14.6 (10/31/2009)   Monos:  8.1 (10/31/2009)   Eosinophils:  2.5 (10/31/2009)   Basophil:  0.9 (10/31/2009)  Complete Metabolic Panel   Glucose:  109 (05/15/2010)   Sodium:  137 (05/15/2010)   Potassium:  4.4 (05/15/2010)   Chloride:  100 (05/15/2010)   CO2:  30 (05/15/2010)   BUN:  26 (05/15/2010)   Creatinine:  1.4 (05/15/2010)   Albumin:  3.9 (10/31/2009)   Total Protein:  8.2 (10/31/2009)   Calcium:  9.7 (05/15/2010)   Total Bili:  0.9 (10/31/2009)   Alk Phos:  109 (10/31/2009)   SGPT (ALT):  22 (10/31/2009)   SGOT (AST):  24 (10/31/2009)   Medications Prior to Update: 1)  Zantac 300 Mg  Tabs (Ranitidine Hcl) .... Take 1 Tablet By Mouth Once A Day 2)  Allegra 180 Mg Tabs (Fexofenadine Hcl) .... Take 1 Tablet By Mouth Once A Day As Needed 3)  Cpap 5-15 Auto Titrate 4)  Luxiq 0.12 % Foam (Betamethasone Valerate) .... Apply To Scalp As Directed 5)  Vitamin D 2000 Unit Tabs (Cholecalciferol) .... Take 1 Tablet By Mouth Once A Day 6)  Mega Basic  Tabs (Multiple Vitamins-Minerals) .... Take 1 Tablet By Mouth Once A Day 7)  Symbicort 160-4.5 Mcg/act Aero (Budesonide-Formoterol Fumarate) .... 2 Puffs Two Times A Day For Asthma 8)  Cymbalta 30 Mg Cpep (Duloxetine Hcl) .... Take 1 Tablet By Mouth Once A Day 9)  Cetirizine Hcl 10 Mg Chew (Cetirizine Hcl) .... As Needed 10)  Carvedilol 6.25 Mg Tabs (Carvedilol) .... Take One Tablet By  Mouth Twice A Day 11)  Spironolactone-Hctz 25-25 Mg Tabs (Spironolactone-Hctz) .... One Tablet Every Other Day 12)  Ketoconazole 2 % Crea (Ketoconazole) .... Apply To Aa Two Times A Day For 14 Days 13)  Nuvigil 150 Mg Tabs (Armodafinil) .Marland Kitchen.. 1 By Mouth Daily As Needed For Sleepiness  Current Medications (verified): 1)  Zantac 300 Mg  Tabs (Ranitidine Hcl) .... Take 1 Tablet By Mouth Once A Day 2)  Allegra 180 Mg Tabs (Fexofenadine Hcl) .... Take 1 Tablet By Mouth Once A Day As Needed 3)  Cpap 5-15 Auto Titrate 4)  Luxiq 0.12 % Foam (Betamethasone Valerate) .... Apply To Scalp As Directed 5)  Vitamin D 2000 Unit Tabs (Cholecalciferol) .... Take 1 Tablet By Mouth Once A Day 6)  Mega Basic  Tabs (Multiple Vitamins-Minerals) .... Take 1 Tablet By Mouth Once A Day 7)  Symbicort 160-4.5 Mcg/act Aero (Budesonide-Formoterol Fumarate) .... 2 Puffs Two Times A Day For Asthma 8)  Cymbalta 30 Mg Cpep (Duloxetine Hcl) .... Take 1 Tablet By Mouth Once A Day 9)  Cetirizine Hcl 10 Mg Chew (Cetirizine Hcl) .... As Needed 10)  Carvedilol 6.25 Mg Tabs (Carvedilol) .... Take One Tablet By Mouth Twice A Day 11)  Spironolactone-Hctz 25-25 Mg Tabs (Spironolactone-Hctz) .... One Tablet Every Other Day 12)  Ketoconazole 2 % Crea (Ketoconazole) .... Apply To Aa Two Times A Day For 14 Days 13)  Nuvigil 150 Mg Tabs (Armodafinil) .Marland Kitchen.. 1 By Mouth Daily As Needed For Sleepiness 14)  Triamcinolone Acetonide 0.5 % Crea (Triamcinolone Acetonide) .... Apply To Aa Two Times A Day For 10 Days  Allergies (verified): 1)  ! Sulfa 2)  ! Nsaids 3)  ! Azor  Past History:  Past Medical History: Last updated: 08/15/2010 CHEST PAIN  HEART MURMUR, SYSTOLIC  HYPERTENSION  HYPERPOTASSEMIA  ASTHMA NOS W/ACUTE EXACERBATION  HYPERSOMNIA  ROUTINE GENERAL MEDICAL EXAM@HEALTH  CARE FACL  DEPRESSIVE DISORDER  DIASTOLIC DYSFUNCTION PYURIA  ABNORMAL ELECTROCARDIOGRAM  PARESTHESIA FAMILY HISTORY BREAST CANCER 1ST DEGREE RELATIVE <50    RENAL INSUFFICIENCY GERD COPD OBSTRUCTIVE SLEEP APNEA --persistent daytime sleepiness despite cpap ALLERGIC RHINITIS Renal insufficiency Psoriasis  Past Surgical History: Last updated: 12/20/2009 Appendectomy Cholecystectomy Hysterectomy Tonsillectomy C-Section  Family History: Last updated: 12/20/2009 Dementia Family History Breast cancer 1st degree relative <50 Family History Hypertension No FH of Colon Cancer: Family History of Ovarian Cancer: Mat Aunt Family History of Bladder Cancer: Father Family History of Heart Disease: MGF, PGF  Social  History: Last updated: 12/20/2009 Married Never Smoked Alcohol use-no Drug use-no Regular exercise-no Daily Caffeine Use  Risk Factors: Alcohol Use: 0 (10/02/2010) Exercise: no (08/10/2008)  Risk Factors: Smoking Status: never (10/02/2010) Passive Smoke Exposure: no (10/02/2010)  Family History: Reviewed history from 12/20/2009 and no changes required. Dementia Family History Breast cancer 1st degree relative <50 Family History Hypertension No FH of Colon Cancer: Family History of Ovarian Cancer: Mat Aunt Family History of Bladder Cancer: Father Family History of Heart Disease: MGF, PGF  Social History: Reviewed history from 12/20/2009 and no changes required. Married Never Smoked Alcohol use-no Drug use-no Regular exercise-no Daily Caffeine Use  Review of Systems  The patient denies anorexia, fever, weight loss, weight gain, chest pain, syncope, dyspnea on exertion, peripheral edema, prolonged cough, headaches, hemoptysis, abdominal pain, hematuria, transient blindness, difficulty walking, depression, enlarged lymph nodes, and angioedema.   Derm:  Complains of itching and rash; denies changes in color of skin, changes in nail beds, dryness, excessive perspiration, flushing, hair loss, insect bite(s), lesion(s), and poor wound healing.  Physical Exam  General:  alert, well-developed, well-nourished,  well-hydrated, appropriate dress, normal appearance, healthy-appearing, cooperative to examination, and overweight-appearing.   Head:  normocephalic, atraumatic, no abnormalities observed, and no abnormalities palpated.   Mouth:  Oral mucosa and oropharynx without lesions or exudates.  Teeth in good repair. Neck:  supple, full ROM, no masses, no thyromegaly, no thyroid nodules or tenderness, no JVD, normal carotid upstroke, no carotid bruits, no cervical lymphadenopathy, and no neck tenderness.   Lungs:  normal respiratory effort, no intercostal retractions, no accessory muscle use, normal breath sounds, no dullness, no fremitus, no crackles, and no wheezes.   Heart:  normal rate, regular rhythm, no gallop, no rub, no JVD, and Grade   1/6 systolic ejection murmur.   Abdomen:  soft, non-tender, normal bowel sounds, no distention, no masses, no guarding, no rigidity, no rebound tenderness, no abdominal hernia, no inguinal hernia, no hepatomegaly, and no splenomegaly.   Msk:  No deformity or scoliosis noted of thoracic or lumbar spine.   Pulses:  R and L carotid,radial,femoral,dorsalis pedis and posterior tibial pulses are full and equal bilaterally Extremities:  No clubbing, cyanosis, edema, or deformity noted with normal full range of motion of all joints.   Neurologic:  No cranial nerve deficits noted. Station and gait are normal. Plantar reflexes are down-going bilaterally. DTRs are symmetrical throughout. Sensory, motor and coordinative functions appear intact. Skin:  there is mild erythema with no scale in the axillae and groin areas - it is very well demarcated, there is no streaking, exudate, warmth, induration, fluctuance. Cervical Nodes:  no anterior cervical adenopathy and no posterior cervical adenopathy.   Axillary Nodes:  no R axillary adenopathy and no L axillary adenopathy.   Psych:  Cognition and judgment appear intact. Alert and cooperative with normal attention span and concentration.  No apparent delusions, illusions, hallucinations   Impression & Recommendations:  Problem # 1:  CONTACT DERMATITIS-NOS (ICD-692.9) Assessment New  The following medications were removed from the medication list:    Cetirizine Hcl 10 Mg Chew (Cetirizine hcl) .Marland Kitchen... As needed Her updated medication list for this problem includes:    Allegra 180 Mg Tabs (Fexofenadine hcl) .Marland Kitchen... Take 1 tablet by mouth once a day as needed    Luxiq 0.12 % Foam (Betamethasone valerate) .Marland Kitchen... Apply to scalp as directed    Triamcinolone Acetonide 0.5 % Crea (Triamcinolone acetonide) .Marland Kitchen... Apply to aa two times a day for 10 days  Problem # 2:  DERMATOPHYTOSIS OF THE BODY (ICD-110.5) Assessment: Improved  Problem # 3:  HYPERTENSION (ICD-401.9) Assessment: Unchanged  Her updated medication list for this problem includes:    Carvedilol 6.25 Mg Tabs (Carvedilol) .Marland Kitchen... Take one tablet by mouth twice a day    Spironolactone-hctz 25-25 Mg Tabs (Spironolactone-hctz) ..... One tablet every other day  BP today: 132/64 Prior BP: 110/60 (09/12/2010)  Prior 10 Yr Risk Heart Disease: 6 % (05/15/2010)  Labs Reviewed: K+: 4.4 (05/15/2010) Creat: : 1.4 (05/15/2010)   Chol: 148 (10/31/2009)   HDL: 43.80 (10/31/2009)   LDL: 81 (10/31/2009)   TG: 117.0 (10/31/2009)  Complete Medication List: 1)  Zantac 300 Mg Tabs (Ranitidine hcl) .... Take 1 tablet by mouth once a day 2)  Allegra 180 Mg Tabs (Fexofenadine hcl) .... Take 1 tablet by mouth once a day as needed 3)  Cpap 5-15 Auto Titrate  4)  Luxiq 0.12 % Foam (Betamethasone valerate) .... Apply to scalp as directed 5)  Vitamin D 2000 Unit Tabs (Cholecalciferol) .... Take 1 tablet by mouth once a day 6)  Mega Basic Tabs (Multiple vitamins-minerals) .... Take 1 tablet by mouth once a day 7)  Symbicort 160-4.5 Mcg/act Aero (Budesonide-formoterol fumarate) .... 2 puffs two times a day for asthma 8)  Cymbalta 30 Mg Cpep (Duloxetine hcl) .... Take 1 tablet by mouth once a  day 9)  Carvedilol 6.25 Mg Tabs (Carvedilol) .... Take one tablet by mouth twice a day 10)  Spironolactone-hctz 25-25 Mg Tabs (Spironolactone-hctz) .... One tablet every other day 11)  Ketoconazole 2 % Crea (Ketoconazole) .... Apply to aa two times a day for 14 days 12)  Nuvigil 150 Mg Tabs (Armodafinil) .Marland Kitchen.. 1 by mouth daily as needed for sleepiness 13)  Triamcinolone Acetonide 0.5 % Crea (Triamcinolone acetonide) .... Apply to aa two times a day for 10 days  Patient Instructions: 1)  Please schedule a follow-up appointment in 2 months. 2)  It is important that you exercise regularly at least 20 minutes 5 times a week. If you develop chest pain, have severe difficulty breathing, or feel very tired , stop exercising immediately and seek medical attention. 3)  You need to lose weight. Consider a lower calorie diet and regular exercise.  4)  Check your Blood Pressure regularly. If it is above 130/80: you should make an appointment. Prescriptions: TRIAMCINOLONE ACETONIDE 0.5 % CREA (TRIAMCINOLONE ACETONIDE) Apply to AA two times a day for 10 days  #60 gms x 2   Entered and Authorized by:   Janith Lima MD   Signed by:   Janith Lima MD on 10/02/2010   Method used:   Print then Give to Patient   RxID:   [id number]    Orders Added: 1)  Est. Patient Level IV [id number]     Preventive Care Screening  Mammogram:    Date:  08/04/2010    Results:  normal

## 2010-10-23 ENCOUNTER — Ambulatory Visit: Payer: Self-pay | Admitting: Cardiovascular Disease

## 2010-11-05 ENCOUNTER — Ambulatory Visit (INDEPENDENT_AMBULATORY_CARE_PROVIDER_SITE_OTHER): Payer: Medicare Other | Admitting: Cardiovascular Disease

## 2010-11-05 ENCOUNTER — Encounter: Payer: Self-pay | Admitting: Cardiovascular Disease

## 2010-11-05 DIAGNOSIS — I10 Essential (primary) hypertension: Secondary | ICD-10-CM

## 2010-11-05 DIAGNOSIS — I421 Obstructive hypertrophic cardiomyopathy: Secondary | ICD-10-CM

## 2010-11-05 DIAGNOSIS — R079 Chest pain, unspecified: Secondary | ICD-10-CM

## 2010-11-05 DIAGNOSIS — R609 Edema, unspecified: Secondary | ICD-10-CM

## 2010-11-05 NOTE — Assessment & Plan Note (Signed)
Improved on current dose of diuretics.  Avoid over diuresis with HOCM

## 2010-11-05 NOTE — Progress Notes (Signed)
Tihesha is seen today for F/U of HOCM.  She has had atypical SSCP.  Myovue 2011 normal  She has an echo 1/10 with signs of LVH and LVOT gradient.  This was not addressed or Rxed at the time.  She has mild CRF and sees Dr Clover Mealy.  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  She requires some diuretic for edema despite HOCM   She thinks she has had increased bruising in the left arm since then.  She denies syncope but does occasionally get dizzy with change in position. .  No fever.  Denies palpitatons.    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  Last visit increased beta blocker and resumed diuretic qod.    ROS: Denies fever, malais, weight loss, blurry vision, decreased visual acuity, cough, sputum, SOB, hemoptysis, pleuritic pain, palpitaitons, heartburn, abdominal pain, melena, lower extremity edema, claudication, or rash.   General: Affect appropriate Healthy:  appears stated age 70: normal Neck supple with no adenopathy JVP normal no bruits no thyromegaly Lungs clear with no wheezing and good diaphragmatic motion Heart:  S1/S2 SEM  That accentuates with valsalva no ,rub, gallop or click PMI normal Abdomen: benighn, BS positve, no tenderness, no AAA no bruit.  No HSM or HJR Distal pulses intact with no bruits No edema Neuro non-focal Skin warm and dry No muscular weakness   Current Outpatient Prescriptions  Medication Sig Dispense Refill  . Armodafinil (NUVIGIL) 150 MG tablet Take 150 mg by mouth as needed.       . budesonide-formoterol (SYMBICORT) 160-4.5 MCG/ACT inhaler Inhale 2 puffs into the lungs 2 (two) times daily.        . carvedilol (COREG) 6.25 MG tablet Take 6.25 mg by mouth 2 (two) times daily with a meal.        . Cholecalciferol (VITAMIN D) 2000 UNITS tablet Take 2,000 Units by mouth daily.        . colestipol (COLESTID) 1 G tablet Take 1 g by mouth daily.        . DULoxetine (CYMBALTA) 60 MG capsule Take 60 mg by mouth daily.        .  fexofenadine (ALLEGRA) 180 MG tablet Take 180 mg by mouth daily. prn       . Multiple Vitamin (MULTIVITAMIN) tablet Take 1 tablet by mouth daily.        . ranitidine (ZANTAC) 300 MG tablet Take 300 mg by mouth at bedtime.        Marland Kitchen spironolactone-hydrochlorothiazide (ALDACTAZIDE) 25-25 MG per tablet Take 1 tablet by mouth daily.        Marland Kitchen DISCONTD: ketoconazole (NIZORAL) 2 % cream Apply topically daily.        Marland Kitchen DISCONTD: triamcinolone (KENALOG) 0.1 % cream Apply topically 2 (two) times daily.          Allergies  Nsaids and Sulfonamide derivatives  Electrocardiogram:  Assessment and Plan

## 2010-11-05 NOTE — Assessment & Plan Note (Signed)
Encouraged her to get labs with Dr Ronnald Ramp next month given the combination of diuretic she is on

## 2010-11-05 NOTE — Assessment & Plan Note (Signed)
Well controlled.  Continue current medications and low sodium Dash type diet.    

## 2010-11-05 NOTE — Assessment & Plan Note (Signed)
Stable HOCM  F/U echo in 6 months.  No need for dysopiramide or med changes at this time

## 2010-11-05 NOTE — Patient Instructions (Signed)
Schedule F/U with Dr. Johnsie Cancel in 6 months. 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

## 2010-11-20 ENCOUNTER — Other Ambulatory Visit (INDEPENDENT_AMBULATORY_CARE_PROVIDER_SITE_OTHER): Payer: Medicare Other

## 2010-11-20 ENCOUNTER — Encounter: Payer: Self-pay | Admitting: Internal Medicine

## 2010-11-20 ENCOUNTER — Ambulatory Visit (INDEPENDENT_AMBULATORY_CARE_PROVIDER_SITE_OTHER): Payer: Medicare Other | Admitting: Internal Medicine

## 2010-11-20 DIAGNOSIS — E875 Hyperkalemia: Secondary | ICD-10-CM

## 2010-11-20 DIAGNOSIS — F3289 Other specified depressive episodes: Secondary | ICD-10-CM

## 2010-11-20 DIAGNOSIS — J45901 Unspecified asthma with (acute) exacerbation: Secondary | ICD-10-CM

## 2010-11-20 DIAGNOSIS — I519 Heart disease, unspecified: Secondary | ICD-10-CM

## 2010-11-20 DIAGNOSIS — N259 Disorder resulting from impaired renal tubular function, unspecified: Secondary | ICD-10-CM

## 2010-11-20 DIAGNOSIS — I10 Essential (primary) hypertension: Secondary | ICD-10-CM

## 2010-11-20 DIAGNOSIS — R209 Unspecified disturbances of skin sensation: Secondary | ICD-10-CM

## 2010-11-20 DIAGNOSIS — F329 Major depressive disorder, single episode, unspecified: Secondary | ICD-10-CM

## 2010-11-20 DIAGNOSIS — R011 Cardiac murmur, unspecified: Secondary | ICD-10-CM

## 2010-11-20 DIAGNOSIS — D692 Other nonthrombocytopenic purpura: Secondary | ICD-10-CM

## 2010-11-20 LAB — BASIC METABOLIC PANEL
GFR: 51.52 mL/min — ABNORMAL LOW (ref >60.00)
Glucose, Bld: 93 mg/dL (ref 70–99)
Potassium: 4 mEq/L (ref 3.5–5.1)
Sodium: 139 mEq/L (ref 135–145)

## 2010-11-20 MED ORDER — L-METHYLFOLATE-B6-B12 3-35-2 MG PO TABS: 1.0000 | ORAL_TABLET | Freq: Every day | ORAL | Status: DC

## 2010-11-20 MED ORDER — VILAZODONE HCL 10 MG PO TABS: 1.0000 | ORAL_TABLET | Freq: Every day | ORAL | Status: DC

## 2010-11-20 NOTE — Assessment & Plan Note (Signed)
She will not do a NCS/EMG so I will start Metanx and see if it helps

## 2010-11-20 NOTE — Assessment & Plan Note (Signed)
I will recheck her K+ level today 

## 2010-11-20 NOTE — Assessment & Plan Note (Signed)
I will recheck her renal function and lytes today

## 2010-11-20 NOTE — Assessment & Plan Note (Signed)
Stop cymbalta and start [place], she will not see a Psychiatrist or do any psychotherapy

## 2010-11-20 NOTE — Assessment & Plan Note (Signed)
Her BP is well controlled, I will check her lytes and renal function today 

## 2010-11-20 NOTE — Assessment & Plan Note (Signed)
She has no s/s of failure or fluid overload

## 2010-11-20 NOTE — Assessment & Plan Note (Signed)
She is doing well on symbicort, will continue this for now

## 2010-11-20 NOTE — Assessment & Plan Note (Signed)
This is unchanged 

## 2010-11-20 NOTE — Patient Instructions (Signed)

## 2010-11-20 NOTE — Progress Notes (Signed)
Subjective:    Patient ID: Holly Simmons, female    DOB: 03-31-1941, 70 y.o.   MRN: [id number]  Hypertension This is a chronic problem. The current episode started more than 1 year ago. The problem has been rapidly improving since onset. The problem is controlled. Pertinent negatives include no anxiety, blurred vision, chest pain, headaches, malaise/fatigue, neck pain, orthopnea, palpitations, peripheral edema, PND, shortness of breath or sweats. There are no associated agents to hypertension. Past treatments include diuretics and beta blockers. The current treatment provides significant improvement. There are no compliance problems.  Hypertensive end-organ damage includes left ventricular hypertrophy. There is no history of angina.   Also, she tells me that Cymbalta is not helping with her depression as she continues to feel sad, apathetic, and has anhedonia.   Review of Systems  Constitutional: Negative for fever, chills, malaise/fatigue, diaphoresis, activity change, appetite change, fatigue and unexpected weight change.  HENT: Negative for facial swelling and neck pain.   Eyes: Negative for blurred vision.  Respiratory: Negative for cough, choking, chest tightness, shortness of breath, wheezing and stridor.   Cardiovascular: Negative for chest pain, palpitations, orthopnea, leg swelling and PND.  Gastrointestinal: Negative for nausea, abdominal pain, diarrhea, constipation, blood in stool and abdominal distention.  Genitourinary: Negative for dysuria, urgency, frequency, hematuria, flank pain, decreased urine volume and difficulty urinating.  Musculoskeletal: Negative for myalgias, back pain, joint swelling, arthralgias and gait problem.  Skin: Negative for color change, pallor and rash.  Neurological: Positive for numbness (in her toes). Negative for dizziness, tremors, seizures, syncope, facial asymmetry, speech difficulty, weakness, light-headedness and headaches.  Hematological:  Negative for adenopathy. Does not bruise/bleed easily.  Psychiatric/Behavioral: Positive for dysphoric mood. Negative for suicidal ideas, hallucinations, behavioral problems, confusion, sleep disturbance, self-injury, decreased concentration and agitation. The patient is not nervous/anxious and is not hyperactive.        Lab Results  Component Value Date   WBC 6.0 10/31/2009   HGB 13.1 10/31/2009   HCT 39.2 10/31/2009   PLT 174.0 10/31/2009   CHOL 148 10/31/2009   TRIG 117.0 10/31/2009   HDL 43.80 10/31/2009   ALT 22 10/31/2009   AST 24 10/31/2009   NA 137 05/15/2010   K 4.4 05/15/2010   CL 100 05/15/2010   CREATININE 1.4* 05/15/2010   BUN 26* 05/15/2010   CO2 30 05/15/2010   TSH 2.17 10/31/2009   Objective:   Physical Exam  [vitalsreviewed. Constitutional: She is oriented to person, place, and time. She appears well-developed and well-nourished. No distress.  HENT:  Head: Normocephalic and atraumatic.  Right Ear: External ear normal.  Left Ear: External ear normal.  Nose: Nose normal.  Mouth/Throat: No oropharyngeal exudate.  Eyes: EOM are normal. Pupils are equal, round, and reactive to light. Left eye exhibits no discharge. No scleral icterus.  Neck: Normal range of motion. Neck supple. No JVD present. No thyromegaly present.  Cardiovascular: Normal rate, regular rhythm and intact distal pulses.  Exam reveals no gallop and no friction rub.   Murmur heard. Pulmonary/Chest: Effort normal and breath sounds normal. No respiratory distress. She has no wheezes. She has no rales. She exhibits no tenderness.  Abdominal: Soft. Bowel sounds are normal. She exhibits no distension and no mass. There is no tenderness. There is no rebound and no guarding.  Musculoskeletal: Normal range of motion. She exhibits no edema and no tenderness.  Lymphadenopathy:    She has no cervical adenopathy.  Neurological: She is alert and oriented to person, place, and time.  She has normal reflexes. She exhibits  normal muscle tone. Coordination normal.  Skin: Skin is warm and dry. No rash noted. She is not diaphoretic. No erythema. No pallor.  Psychiatric: She has a normal mood and affect. Her behavior is normal. Judgment and thought content normal.          Assessment & Plan:

## 2010-11-27 ENCOUNTER — Telehealth: Payer: Self-pay | Admitting: *Deleted

## 2010-11-27 DIAGNOSIS — F329 Major depressive disorder, single episode, unspecified: Secondary | ICD-10-CM

## 2010-11-27 DIAGNOSIS — F3289 Other specified depressive episodes: Secondary | ICD-10-CM

## 2010-11-27 DIAGNOSIS — R209 Unspecified disturbances of skin sensation: Secondary | ICD-10-CM

## 2010-11-27 NOTE — Telephone Encounter (Signed)
1.Metanx has helped her foot - Should she continue? IF yes, how does she take med, has 6 left - printed sheet said 1 qd and per pt MD advised 2 qd.  2. FYI - Viibryd has helped 3. Req results of labs.

## 2010-11-28 ENCOUNTER — Other Ambulatory Visit: Payer: Self-pay | Admitting: *Deleted

## 2010-11-28 DIAGNOSIS — R209 Unspecified disturbances of skin sensation: Secondary | ICD-10-CM

## 2010-11-28 MED ORDER — VILAZODONE HCL 10 MG PO TABS: 2.0000 | ORAL_TABLET | Freq: Every day | ORAL | Status: DC

## 2010-11-28 MED ORDER — L-METHYLFOLATE-B6-B12 3-35-2 MG PO TABS: 1.0000 | ORAL_TABLET | Freq: Two times a day (BID) | ORAL | Status: DC

## 2010-11-28 NOTE — Telephone Encounter (Signed)
Labs look good, metanx is bid (pharmacy needed), great news on viibryd

## 2010-11-28 NOTE — Telephone Encounter (Signed)
Patient informed, samples of viibryd given

## 2010-12-17 NOTE — Procedures (Signed)
NAME:  Holly Simmons, Holly Simmons          ACCOUNT NO.:  [id number]   MEDICAL RECORD NO.:  [id number]          PATIENT TYPE:  OUT   LOCATION:  SLEEP CENTER                 FACILITY:  [facility]   PHYSICIAN:  Clinton D. Annamaria Boots, MD, FCCP, FACPDATE OF BIRTH:  Mar [birth year] 1943   DATE OF STUDY:  06/15/2007                            NOCTURNAL POLYSOMNOGRAM   REFERRING PHYSICIAN:  Jamse Mead, Dr.   Katy Apo FOR STUDY:  Insomnia with sleep apnea.   EPWORTH SLEEPINESS SCORE:  18/24, BMI 33, weight 193 pounds, height 64  inches.   MEDICATIONS:  Home medications listed and reviewed.   SLEEP ARCHITECTURE:  Total sleep time 390 minutes with sleep efficiency  91%.  Stage I was 2%, stage II 75%, stage III 8%, REM 14% of total sleep  time.  Sleep latency 19 minutes, REM latency 155 minutes, awake after  sleep onset 20 minutes, arousal index 5.1.  No bedtime medication was  taken.   RESPIRATORY DATA:  Apnea-hypopnea index (AHI) 6.3 obstructive events per  hour, indicating mild obstructive sleep apnea/hypopnea syndrome.  This  included two obstructive apneas, one central apnea and 38 hypopneas.  Events were not positional but somewhat more common while supine.   OXYGEN DATA:  Moderate snoring with oxygen desaturation to a nadir of  77%.  Mean oxygen saturation through the study was 89.7% on room air,  suggesting underlying cardiopulmonary disease.   CARDIAC DATA:  Normal sinus rhythm.   MOVEMENT-PARASOMNIA:  Occasional limb jerk, insignificant.   IMPRESSIONS-RECOMMENDATIONS:  1. Mild obstructive sleep apnea/hypopnea syndrome, apnea-hypopnea      index 6.3 per hour with nonpositional events, moderate snoring and      oxygen desaturation to a nadir of 77 percent.  2. Scores in this range are not usually addressed with continuous      positive airway pressure therapy.  Assess for alternatives      including weight loss treatment for any upper airway or nasal      obstruction, and encouragement to sleep off  flat of back.  3. There was significant oxygen desaturation during sleep on this      study which may contribute to patient's      complaint.  Is there other evidence of cardiopulmonary disease.      Consider evaluating for home oxygen during sleep.      Clinton D. Annamaria Boots, MD, [facility], [place], Tax adviser of Sleep Medicine  Electronically Signed     CDY/MEDQ  D:  06/20/2007 13:48:59  T:  06/21/2007 08:50:45  Job:  [id number]

## 2010-12-25 ENCOUNTER — Ambulatory Visit: Payer: BC Managed Care – PPO | Admitting: Internal Medicine

## 2011-01-06 ENCOUNTER — Encounter: Payer: Self-pay | Admitting: Internal Medicine

## 2011-01-07 ENCOUNTER — Encounter: Payer: Self-pay | Admitting: Internal Medicine

## 2011-01-07 ENCOUNTER — Other Ambulatory Visit: Payer: Self-pay | Admitting: Gastroenterology

## 2011-01-07 ENCOUNTER — Ambulatory Visit (INDEPENDENT_AMBULATORY_CARE_PROVIDER_SITE_OTHER): Payer: Medicare Other | Admitting: Internal Medicine

## 2011-01-07 DIAGNOSIS — R209 Unspecified disturbances of skin sensation: Secondary | ICD-10-CM

## 2011-01-07 DIAGNOSIS — F329 Major depressive disorder, single episode, unspecified: Secondary | ICD-10-CM

## 2011-01-07 DIAGNOSIS — F3289 Other specified depressive episodes: Secondary | ICD-10-CM

## 2011-01-07 DIAGNOSIS — I421 Obstructive hypertrophic cardiomyopathy: Secondary | ICD-10-CM

## 2011-01-07 DIAGNOSIS — I10 Essential (primary) hypertension: Secondary | ICD-10-CM

## 2011-01-07 DIAGNOSIS — N259 Disorder resulting from impaired renal tubular function, unspecified: Secondary | ICD-10-CM

## 2011-01-07 MED ORDER — COLESTIPOL HCL 1 G PO TABS: 1.0000 g | ORAL_TABLET | Freq: Two times a day (BID) | ORAL | Status: DC

## 2011-01-07 MED ORDER — KETOCONAZOLE 2 % EX CREA: TOPICAL_CREAM | Freq: Every day | CUTANEOUS | Status: DC

## 2011-01-07 MED ORDER — VILAZODONE HCL 40 MG PO TABS: 1.0000 | ORAL_TABLET | Freq: Every day | ORAL | Status: DC

## 2011-01-07 NOTE — Progress Notes (Signed)
Subjective:    Patient ID: Holly Simmons, female    DOB: 07-15-1941, 70 y.o.   MRN: [id number]  Hypertension This is a chronic problem. The current episode started more than 1 year ago. The problem has been gradually improving since onset. The problem is controlled. Pertinent negatives include no anxiety, blurred vision, chest pain, headaches, malaise/fatigue, neck pain, orthopnea, palpitations, peripheral edema, PND, shortness of breath or sweats. There are no associated agents to hypertension. Past treatments include diuretics and beta blockers. The current treatment provides significant improvement. Compliance problems include exercise and diet.       Review of Systems  Constitutional: Negative for fever, chills, malaise/fatigue, diaphoresis, activity change, appetite change, fatigue and unexpected weight change.  HENT: Negative for sore throat, facial swelling, trouble swallowing, neck pain and voice change.   Eyes: Negative for blurred vision, photophobia and visual disturbance.  Respiratory: Positive for apnea. Negative for cough, choking, chest tightness, shortness of breath, wheezing and stridor.   Cardiovascular: Negative for chest pain, palpitations, orthopnea, leg swelling and PND.  Gastrointestinal: Positive for diarrhea (occasionally, it is controlled by Colestipol). Negative for nausea, vomiting, abdominal pain, constipation, blood in stool, abdominal distention and anal bleeding.  Genitourinary: Negative for dysuria, urgency, frequency, hematuria, flank pain, decreased urine volume, enuresis, difficulty urinating and dyspareunia.  Musculoskeletal: Negative for myalgias, back pain, joint swelling, arthralgias and gait problem.  Skin: Negative for color change, pallor and rash.  Neurological: Positive for numbness (in her feet, this is improving with metanx). Negative for dizziness, tremors, seizures, syncope, facial asymmetry, speech difficulty, weakness, light-headedness and  headaches.  Hematological: Negative for adenopathy. Does not bruise/bleed easily.  Psychiatric/Behavioral: Positive for dysphoric mood. Negative for suicidal ideas, hallucinations, behavioral problems, confusion, sleep disturbance, self-injury, decreased concentration and agitation. The patient is not nervous/anxious and is not hyperactive.        Objective:   Physical Exam  Vitals reviewed. Constitutional: She is oriented to person, place, and time. She appears well-developed and well-nourished. No distress.  HENT:  Head: Normocephalic and atraumatic.  Right Ear: External ear normal.  Left Ear: External ear normal.  Nose: Nose normal.  Mouth/Throat: Oropharynx is clear and moist. No oropharyngeal exudate.  Eyes: Conjunctivae and EOM are normal. Pupils are equal, round, and reactive to light. Right eye exhibits no discharge. Left eye exhibits no discharge. No scleral icterus.  Neck: Normal range of motion. Neck supple. No JVD present. No tracheal deviation present. No thyromegaly present.  Cardiovascular: Normal rate, regular rhythm and intact distal pulses.  Exam reveals no gallop and no friction rub.   No murmur heard. Pulmonary/Chest: Effort normal and breath sounds normal. No stridor. No respiratory distress. She has no wheezes. She has no rales. She exhibits no tenderness.  Abdominal: Soft. Bowel sounds are normal. She exhibits no distension and no mass. There is no tenderness. There is no rebound and no guarding.  Musculoskeletal: Normal range of motion. She exhibits no edema and no tenderness.  Lymphadenopathy:    She has no cervical adenopathy.  Neurological: She is alert and oriented to person, place, and time. She displays normal reflexes. No cranial nerve deficit. She exhibits normal muscle tone. Coordination normal.  Skin: Skin is warm and dry. No rash noted. She is not diaphoretic. No erythema. No pallor.  Psychiatric: She has a normal mood and affect. Her behavior is normal.  Judgment and thought content normal.         Lab Results  Component Value Date   WBC 6.0  10/31/2009   HGB 13.1 10/31/2009   HCT 39.2 10/31/2009   PLT 174.0 10/31/2009   CHOL 148 10/31/2009   TRIG 117.0 10/31/2009   HDL 43.80 10/31/2009   ALT 22 10/31/2009   AST 24 10/31/2009   NA 139 11/20/2010   K 4.0 11/20/2010   CL 101 11/20/2010   CREATININE 1.1 11/20/2010   BUN 16 11/20/2010   CO2 31 11/20/2010   TSH 2.17 10/31/2009   Assessment & Plan:

## 2011-01-07 NOTE — Assessment & Plan Note (Signed)
She has a normal volume status today

## 2011-01-07 NOTE — Patient Instructions (Signed)

## 2011-01-07 NOTE — Assessment & Plan Note (Signed)
Increase dose of viibryd

## 2011-01-07 NOTE — Assessment & Plan Note (Signed)
This has improved.

## 2011-01-07 NOTE — Assessment & Plan Note (Signed)
Her BP is well controlled 

## 2011-01-07 NOTE — Assessment & Plan Note (Signed)
Continue metanx

## 2011-01-15 ENCOUNTER — Other Ambulatory Visit: Payer: Self-pay | Admitting: *Deleted

## 2011-01-15 ENCOUNTER — Other Ambulatory Visit: Payer: Self-pay | Admitting: Internal Medicine

## 2011-01-15 MED ORDER — COLESTIPOL HCL 1 G PO TABS: 1.0000 g | ORAL_TABLET | Freq: Two times a day (BID) | ORAL | Status: DC

## 2011-01-15 MED ORDER — KETOCONAZOLE 2 % EX CREA: TOPICAL_CREAM | Freq: Every day | CUTANEOUS | Status: DC

## 2011-01-16 MED ORDER — CALCIPOTRIENE 0.005 % EX CREA: TOPICAL_CREAM | Freq: Two times a day (BID) | CUTANEOUS | Status: DC

## 2011-01-16 NOTE — Progress Notes (Signed)
Addended by: Janith Lima on: 01/16/2011 07:39 AM   Modules accepted: Orders

## 2011-04-10 ENCOUNTER — Ambulatory Visit (INDEPENDENT_AMBULATORY_CARE_PROVIDER_SITE_OTHER): Payer: Medicare Other | Admitting: Pulmonary Disease

## 2011-04-10 ENCOUNTER — Encounter: Payer: Self-pay | Admitting: Pulmonary Disease

## 2011-04-10 DIAGNOSIS — G4710 Hypersomnia, unspecified: Secondary | ICD-10-CM

## 2011-04-10 DIAGNOSIS — G4733 Obstructive sleep apnea (adult) (pediatric): Secondary | ICD-10-CM

## 2011-04-10 MED ORDER — ARMODAFINIL 150 MG PO TABS: 150.0000 mg | ORAL_TABLET | Freq: Every day | ORAL | Status: DC

## 2011-04-10 NOTE — Assessment & Plan Note (Addendum)
The pt is doing well with cpap, and feels it is helping her sleep and alertness during the day.  She is having no issues with mask or pressure.  Her compliance download is excellent for the last 30mos.   I have asked her to work on weight loss, and to keep up with supplies and mask changes.

## 2011-04-10 NOTE — Assessment & Plan Note (Signed)
The pt is taking nuvigil periodically on days that she does a lot of driving, and with critical tasks that require her upmost alertness.  She is doing well with this.

## 2011-04-10 NOTE — Progress Notes (Signed)
Addended by: Manson Allan on: 04/10/2011 10:34 AM   Modules accepted: Orders

## 2011-04-10 NOTE — Progress Notes (Signed)
  Subjective:    Patient ID: Holly Simmons, female    DOB: 1940/08/06, 70 y.o.   MRN: [id number]  HPI The patient comes in today for followup of her known obstructive sleep apnea, along with persistent daytime sleepiness despite adequate use of CPAP.  She is wearing CPAP compliantly by her download for the last 8 months, and has no significant mask leaks on the download report.  Her AHI is well controlled.  The patient feels that she is sleeping well with the device, in that it does help her alertness during the day.  She is using the digital only periodically, when she needs to drive or do high alertness tasks.  The patient's weight is up approximately 7 pounds since last visit.   Review of Systems  Constitutional: Negative for fever and unexpected weight change.  HENT: Positive for congestion, sneezing and sinus pressure. Negative for ear pain, nosebleeds, sore throat, rhinorrhea, trouble swallowing, dental problem and postnasal drip.   Eyes: Positive for redness and itching.  Respiratory: Positive for cough and shortness of breath. Negative for chest tightness and wheezing.   Cardiovascular: Positive for palpitations and leg swelling.  Gastrointestinal: Negative for nausea and vomiting.  Genitourinary: Negative for dysuria.  Musculoskeletal: Negative for joint swelling.  Skin: Positive for rash.  Neurological: Negative for headaches.  Hematological: Does not bruise/bleed easily.  Psychiatric/Behavioral: Positive for dysphoric mood. The patient is nervous/anxious.        Objective:   Physical Exam Overweight female in no acute distress No skin breakdown or pressure necrosis from the CPAP mask Lower extremities without edema, no cyanosis noted Alert, does not appear to be sleepy, moves all 4 extremities.       Assessment & Plan:

## 2011-04-10 NOTE — Patient Instructions (Signed)
Will refill your nuvigil Work on weight loss Stay on cpap, and followup with me in one year.

## 2011-05-07 ENCOUNTER — Encounter: Payer: Self-pay | Admitting: *Deleted

## 2011-05-14 ENCOUNTER — Ambulatory Visit: Payer: BC Managed Care – PPO | Admitting: Internal Medicine

## 2011-05-14 ENCOUNTER — Ambulatory Visit (INDEPENDENT_AMBULATORY_CARE_PROVIDER_SITE_OTHER): Payer: Medicare Other | Admitting: Internal Medicine

## 2011-05-14 ENCOUNTER — Other Ambulatory Visit: Payer: Self-pay | Admitting: Internal Medicine

## 2011-05-14 ENCOUNTER — Encounter: Payer: Self-pay | Admitting: Internal Medicine

## 2011-05-14 ENCOUNTER — Other Ambulatory Visit (INDEPENDENT_AMBULATORY_CARE_PROVIDER_SITE_OTHER): Payer: Medicare Other

## 2011-05-14 VITALS — BP 140/76 | HR 84 | Temp 97.8°F | Resp 16

## 2011-05-14 DIAGNOSIS — N259 Disorder resulting from impaired renal tubular function, unspecified: Secondary | ICD-10-CM

## 2011-05-14 DIAGNOSIS — I10 Essential (primary) hypertension: Secondary | ICD-10-CM

## 2011-05-14 DIAGNOSIS — Z0000 Encounter for general adult medical examination without abnormal findings: Secondary | ICD-10-CM

## 2011-05-14 DIAGNOSIS — Z23 Encounter for immunization: Secondary | ICD-10-CM

## 2011-05-14 LAB — CBC WITH DIFFERENTIAL/PLATELET
Basophils Absolute: 0 10*3/uL (ref 0.0–0.1)
Basophils Relative: 0.3 % (ref 0.0–3.0)
Hemoglobin: 12.7 g/dL (ref 12.0–15.0)
Lymphocytes Relative: 16.9 % (ref 12.0–46.0)
Monocytes Relative: 9.6 % (ref 3.0–12.0)
Neutro Abs: 4.5 10*3/uL (ref 1.4–7.7)
RBC: 4.09 Mil/uL (ref 3.87–5.11)
RDW: 13.4 % (ref 11.5–14.6)

## 2011-05-14 LAB — URINALYSIS, ROUTINE W REFLEX MICROSCOPIC
Bilirubin Urine: NEGATIVE
Hgb urine dipstick: NEGATIVE
Ketones, ur: NEGATIVE
Urine Glucose: NEGATIVE
Urobilinogen, UA: 0.2 (ref 0.0–1.0)

## 2011-05-14 LAB — LIPID PANEL
LDL Cholesterol: 78 mg/dL (ref 0–99)
Total CHOL/HDL Ratio: 3

## 2011-05-14 LAB — COMPREHENSIVE METABOLIC PANEL
ALT: 39 U/L — ABNORMAL HIGH (ref 0–35)
Albumin: 4 g/dL (ref 3.5–5.2)
CO2: 29 mEq/L (ref 19–32)
Calcium: 9.3 mg/dL (ref 8.4–10.5)
Chloride: 102 mEq/L (ref 96–112)
Creatinine, Ser: 1.1 mg/dL (ref 0.4–1.2)
GFR: 51.98 mL/min — ABNORMAL LOW (ref >60.00)

## 2011-05-14 NOTE — Progress Notes (Signed)
Subjective:    Patient ID: Holly Simmons, female    DOB: 1941-01-31, 70 y.o.   MRN: [id number]  Hypertension This is a chronic problem. The current episode started more than 1 year ago. The problem has been gradually improving since onset. The problem is controlled. Pertinent negatives include no anxiety, blurred vision, chest pain, headaches, malaise/fatigue, neck pain, orthopnea, palpitations, peripheral edema, PND, shortness of breath or sweats. There are no associated agents to hypertension. Past treatments include diuretics and beta blockers. The current treatment provides significant improvement. Compliance problems include exercise and diet.       Review of Systems  Constitutional: Negative for fever, chills, malaise/fatigue, diaphoresis, activity change, appetite change, fatigue and unexpected weight change.  HENT: Negative for sore throat, trouble swallowing, neck pain and voice change.   Eyes: Negative.  Negative for blurred vision.  Respiratory: Negative for apnea, cough, choking, chest tightness, shortness of breath, wheezing and stridor.   Cardiovascular: Negative for chest pain, palpitations, orthopnea, leg swelling and PND.  Gastrointestinal: Negative for nausea, vomiting, abdominal pain, diarrhea, constipation, blood in stool, abdominal distention and anal bleeding.  Genitourinary: Negative for dysuria, urgency, frequency, hematuria, flank pain, decreased urine volume, vaginal bleeding, vaginal discharge, enuresis, difficulty urinating, genital sores, vaginal pain, menstrual problem, pelvic pain and dyspareunia.  Musculoskeletal: Negative for myalgias, back pain, joint swelling, arthralgias and gait problem.  Skin: Negative for color change, pallor, rash and wound.  Neurological: Negative for dizziness, tremors, seizures, syncope, facial asymmetry, speech difficulty, weakness, light-headedness, numbness and headaches.  Hematological: Negative for adenopathy. Does not  bruise/bleed easily.  Psychiatric/Behavioral: Negative.        Objective:   Physical Exam  Vitals reviewed. Constitutional: She is oriented to person, place, and time. She appears well-developed and well-nourished. No distress.  HENT:  Head: Normocephalic and atraumatic.  Mouth/Throat: Oropharynx is clear and moist. No oropharyngeal exudate.  Eyes: Conjunctivae and EOM are normal. Pupils are equal, round, and reactive to light. Right eye exhibits no discharge. Left eye exhibits no discharge. No scleral icterus.  Neck: Normal range of motion. Neck supple. No JVD present. No tracheal deviation present. No thyromegaly present.  Cardiovascular: Normal rate, regular rhythm and intact distal pulses.  Exam reveals no gallop and no friction rub.   Murmur heard. Pulmonary/Chest: Effort normal and breath sounds normal. No stridor. No respiratory distress. She has no wheezes. She has no rales. She exhibits no tenderness.  Abdominal: Soft. Bowel sounds are normal. She exhibits no distension. There is no tenderness. There is no rebound and no guarding.  Musculoskeletal: Normal range of motion. She exhibits no edema and no tenderness.  Lymphadenopathy:    She has no cervical adenopathy.  Neurological: She is oriented to person, place, and time. She displays normal reflexes. She exhibits normal muscle tone. Coordination normal.  Skin: Skin is warm and dry. No rash noted. She is not diaphoretic. No erythema. No pallor.  Psychiatric: She has a normal mood and affect. Her behavior is normal. Judgment and thought content normal.     Lab Results  Component Value Date   WBC 6.0 10/31/2009   HGB 13.1 10/31/2009   HCT 39.2 10/31/2009   PLT 174.0 10/31/2009   GLUCOSE 93 11/20/2010   CHOL 148 10/31/2009   TRIG 117.0 10/31/2009   HDL 43.80 10/31/2009   LDLCALC 81 10/31/2009   ALT 22 10/31/2009   AST 24 10/31/2009   NA 139 11/20/2010   K 4.0 11/20/2010   CL 101 11/20/2010   CREATININE  1.1 11/20/2010   BUN 16  11/20/2010   CO2 31 11/20/2010   TSH 2.17 10/31/2009       Assessment & Plan:

## 2011-05-14 NOTE — Patient Instructions (Signed)

## 2011-05-15 ENCOUNTER — Encounter: Payer: Self-pay | Admitting: *Deleted

## 2011-05-15 ENCOUNTER — Ambulatory Visit (HOSPITAL_COMMUNITY): Payer: Medicare Other | Attending: Cardiovascular Disease | Admitting: Radiology

## 2011-05-15 ENCOUNTER — Encounter: Payer: Self-pay | Admitting: Cardiovascular Disease

## 2011-05-15 ENCOUNTER — Ambulatory Visit (INDEPENDENT_AMBULATORY_CARE_PROVIDER_SITE_OTHER): Payer: Medicare Other | Admitting: Cardiovascular Disease

## 2011-05-15 DIAGNOSIS — N189 Chronic kidney disease, unspecified: Secondary | ICD-10-CM | POA: Insufficient documentation

## 2011-05-15 DIAGNOSIS — Z01810 Encounter for preprocedural cardiovascular examination: Secondary | ICD-10-CM

## 2011-05-15 DIAGNOSIS — I421 Obstructive hypertrophic cardiomyopathy: Secondary | ICD-10-CM

## 2011-05-15 DIAGNOSIS — R079 Chest pain, unspecified: Secondary | ICD-10-CM | POA: Insufficient documentation

## 2011-05-15 DIAGNOSIS — E669 Obesity, unspecified: Secondary | ICD-10-CM | POA: Insufficient documentation

## 2011-05-15 DIAGNOSIS — I129 Hypertensive chronic kidney disease with stage 1 through stage 4 chronic kidney disease, or unspecified chronic kidney disease: Secondary | ICD-10-CM | POA: Insufficient documentation

## 2011-05-15 DIAGNOSIS — I517 Cardiomegaly: Secondary | ICD-10-CM

## 2011-05-15 DIAGNOSIS — I059 Rheumatic mitral valve disease, unspecified: Secondary | ICD-10-CM

## 2011-05-15 DIAGNOSIS — I10 Essential (primary) hypertension: Secondary | ICD-10-CM

## 2011-05-15 DIAGNOSIS — N259 Disorder resulting from impaired renal tubular function, unspecified: Secondary | ICD-10-CM

## 2011-05-15 DIAGNOSIS — G4710 Hypersomnia, unspecified: Secondary | ICD-10-CM

## 2011-05-15 DIAGNOSIS — Z0000 Encounter for general adult medical examination without abnormal findings: Secondary | ICD-10-CM | POA: Insufficient documentation

## 2011-05-15 DIAGNOSIS — G4733 Obstructive sleep apnea (adult) (pediatric): Secondary | ICD-10-CM | POA: Insufficient documentation

## 2011-05-15 NOTE — Patient Instructions (Addendum)
Your physician recommends that you schedule a follow-up appointment in: [place] has requested that you have a cardiac catheterization. Cardiac catheterization is used to diagnose and/or treat various heart conditions. Doctors may recommend this procedure for a number of different reasons. The most common reason is to evaluate chest pain. Chest pain can be a symptom of coronary artery disease (CAD), and cardiac catheterization can show whether plaque is narrowing or blocking your heart's arteries. This procedure is also used to evaluate the valves, as well as measure the blood flow and oxygen levels in different parts of your heart. For further information please visit [email or web address]. Please follow instruction sheet, as given.   A chest x-ray takes a picture of the organs and structures inside the chest, including the heart, lungs, and blood vessels. This test can show several things, including, whether the heart is enlarges; whether fluid is building up in the lungs; and whether pacemaker / defibrillator leads are still in place. AT [address]

## 2011-05-15 NOTE — Assessment & Plan Note (Signed)
Well controlled.  Continue current medications and low sodium Dash type diet.    

## 2011-05-15 NOTE — Assessment & Plan Note (Signed)
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  Right and left heart cath and referral for surgical correction  Continue beta blockade and minimize diuretics

## 2011-05-15 NOTE — Assessment & Plan Note (Addendum)
The patient is here for annual Medicare wellness examination and management of other chronic and acute problems.   The risk factors are reflected in the social history.  The roster of all physicians providing medical care to patient - is listed in the Snapshot section of the chart.  Activities of daily living:  The patient is 100% inedpendent in all ADLs: dressing, toileting, feeding as well as independent mobility  Home safety : The patient has smoke detectors in the home. They wear seatbelts.No firearms at home ( firearms are present in the home, kept in a safe fashion). There is no violence in the home.   There is no risks for hepatitis, STDs or HIV. There is no   history of blood transfusion. They have no travel history to infectious disease endemic areas of the world.  The patient has (has not) seen their dentist in the last six month. They have (not) seen their eye doctor in the last year. They deny (admit to) any hearing difficulty and have not had audiologic testing in the last year.  They do not  have excessive sun exposure. Discussed the need for sun protection: hats, long sleeves and use of sunscreen if there is significant sun exposure.   Diet: the importance of a healthy diet is discussed. They do have a healthy (unhealthy-high fat/fast food) diet.  The patient has a regular exercise program: 20 ,  Minute duration,  4 times per week.  The benefits of regular aerobic exercise were discussed.  Depression screen: there are no signs or vegative symptoms of depression- irritability, change in appetite, anhedonia, sadness/tearfullness.  Cognitive assessment: the patient manages all their financial and personal affairs and is actively engaged. They could relate day,date,year and events; recalled 3/3 objects at 3 minutes; performed clock-face test normally.  The following portions of the patient's history were reviewed and updated as appropriate: allergies, current medications, past family  history, past medical history,  past surgical history, past social history  and problem list.  Vision, hearing, body mass index were assessed and reviewed.   During the course of the visit the patient was educated and counseled about appropriate screening and preventive services including : fall prevention , diabetes screening, nutrition counseling, colorectal cancer screening, and recommended immunizations.

## 2011-05-15 NOTE — Assessment & Plan Note (Signed)
Continue nuvigil per primary.  Continue CPAP and Rx for OSA

## 2011-05-15 NOTE — Progress Notes (Signed)
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 Myovue 2011 normal She has an echo 1/10 with signs of LVH and LVOT gradient. This was not addressed or Rxed at the time. She has mild CRF and sees Dr Clover Mealy. 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 She requires some diuretic for edema despite HOCM She thinks she has had increased bruising in the left arm since then. She denies syncope but does occasionally get dizzy with change in position. . No fever. Denies palpitatons.  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 Last visit increased beta blocker and resumed diuretic qod.   F/U echo today reviewed. Still with 3.55m/sec gradient at rest and mild MR basal septum 68mm, Velocity increases to 4.2 m/sec with valsalva  She has significant exertional dyspnea.  No way to improve this with medication.  Discussed need for myectomy.  Will schedule cath and then send records to [facility] or [facility] depending on insurance coverage.   ROS: Denies fever, malais, weight loss, blurry vision, decreased visual acuity, cough, sputum, SOB, hemoptysis, pleuritic pain, palpitaitons, heartburn, abdominal pain, melena, lower extremity edema, claudication, or rash.  All other systems reviewed and negative  General: Affect appropriate Healthy:  appears stated age 70: normal Neck supple with no adenopathy JVP normal no bruits no thyromegaly Lungs clear with no wheezing and good diaphragmatic motion Heart:  S1/S2 HOCM murmur increases with valsalva with some dizzyness no ,rub, gallop or click PMI normal Abdomen: benighn, BS positve, no tenderness, no AAA no bruit.  No HSM or HJR Distal pulses intact with no bruits No edema Neuro non-focal Skin warm and dry No muscular weakness   Current Outpatient Prescriptions  Medication Sig Dispense Refill  . Armodafinil (NUVIGIL) 150 MG tablet Take 1 tablet (150 mg total) by mouth daily.  30 tablet  5  . budesonide-formoterol (SYMBICORT)  160-4.5 MCG/ACT inhaler Inhale 2 puffs into the lungs 2 (two) times daily.        . calcipotriene (DOVONEX) 0.005 % cream Apply topically 2 (two) times daily.  60 g  0  . carvedilol (COREG) 6.25 MG tablet Take 6.25 mg by mouth 2 (two) times daily with a meal.        . Cholecalciferol (VITAMIN D) 2000 UNITS tablet Take 2,000 Units by mouth daily.        . colestipol (COLESTID) 1 G tablet Take 1 tablet (1 g total) by mouth 2 (two) times daily.  180 tablet  3  . fexofenadine (ALLEGRA) 180 MG tablet Take 180 mg by mouth daily. prn       . ketoconazole (NIZORAL) 2 % cream Apply topically daily.  15 g  0  . l-methylfolate-B6-B12 (METANX) 3-35-2 MG TABS Take 1 tablet by mouth 2 (two) times daily.  180 tablet  3  . LUXIQ 0.12 % foam APPLY TO SCALP AS DIRECTED  100 g  9  . Multiple Vitamin (MULTIVITAMIN) tablet Take 1 tablet by mouth daily.        . ranitidine (ZANTAC) 300 MG tablet Take 300 mg by mouth at bedtime.        Marland Kitchen spironolactone-hydrochlorothiazide (ALDACTAZIDE) 25-25 MG per tablet Take 1 tablet by mouth daily.        . Vilazodone HCl (VIIBRYD) 40 MG TABS Take 1 tablet by mouth daily.  140 tablet  0    Allergies  Tetanus toxoids; Nsaids; and Sulfonamide derivatives  Electrocardiogram:  NSR 69 normal ECG  Assessment and Plan

## 2011-05-15 NOTE — Assessment & Plan Note (Signed)
Reviewed labs from 10/10.  K 4.0 and Cr 1.1   F/U Clover Mealy

## 2011-05-16 ENCOUNTER — Ambulatory Visit (INDEPENDENT_AMBULATORY_CARE_PROVIDER_SITE_OTHER)
Admission: RE | Admit: 2011-05-16 | Discharge: 2011-05-16 | Disposition: A | Discharge status: Home / Self Care | Payer: Medicare Other | Source: Ambulatory Visit | Attending: Cardiovascular Disease | Admitting: Cardiovascular Disease

## 2011-05-16 DIAGNOSIS — I421 Obstructive hypertrophic cardiomyopathy: Secondary | ICD-10-CM

## 2011-05-16 DIAGNOSIS — Z01810 Encounter for preprocedural cardiovascular examination: Secondary | ICD-10-CM

## 2011-05-21 ENCOUNTER — Telehealth: Payer: Self-pay | Admitting: Cardiovascular Disease

## 2011-05-21 NOTE — Telephone Encounter (Signed)
Pt returning your call

## 2011-05-21 NOTE — Telephone Encounter (Signed)
Spoke with pt, aware of chest xray results Holly Simmons

## 2011-05-21 NOTE — Telephone Encounter (Signed)
Pt returning call from someone in our office. Please return pt call.

## 2011-05-26 ENCOUNTER — Inpatient Hospital Stay (HOSPITAL_BASED_OUTPATIENT_CLINIC_OR_DEPARTMENT_OTHER)
Admission: RE | Admit: 2011-05-26 | Discharge: 2011-05-26 | Disposition: A | Discharge status: Home / Self Care | Payer: Medicare Other | Source: Ambulatory Visit | Attending: Cardiovascular Disease | Admitting: Cardiovascular Disease

## 2011-05-26 DIAGNOSIS — I422 Other hypertrophic cardiomyopathy: Secondary | ICD-10-CM | POA: Insufficient documentation

## 2011-05-26 DIAGNOSIS — R0989 Other specified symptoms and signs involving the circulatory and respiratory systems: Secondary | ICD-10-CM

## 2011-05-26 DIAGNOSIS — R0609 Other forms of dyspnea: Secondary | ICD-10-CM

## 2011-05-27 LAB — POCT I-STAT 3, ART BLOOD GAS (G3+)
Acid-base deficit: 3 mmol/L — ABNORMAL HIGH (ref 0.0–2.0)
O2 Saturation: 90 %
TCO2: 25 mmol/L (ref 0–100)
pCO2 arterial: 44.1 mmHg (ref 35.0–45.0)
pO2, Arterial: 62 mmHg — ABNORMAL LOW (ref 80.0–100.0)

## 2011-05-27 LAB — POCT I-STAT 3, VENOUS BLOOD GAS (G3P V)
O2 Saturation: 63 %
TCO2: 24 mmol/L (ref 0–100)

## 2011-05-29 ENCOUNTER — Encounter: Payer: Self-pay | Admitting: Cardiovascular Disease

## 2011-05-29 NOTE — Cardiovascular Report (Signed)
  NAMECHAKARA, IRUEGAS          ACCOUNT NO.:  [id number]  MEDICAL RECORD NO.:  [id number]  LOCATION:  NINV                         FACILITY:  [facility]  PHYSICIAN:  Wallis Bamberg. Johnsie Cancel, MD, FACCDATE OF BIRTH:  1940/11/09  DATE OF PROCEDURE:  05/26/2011 DATE OF DISCHARGE:  05/15/2011                           CARDIAC CATHETERIZATION   70 year old patient with hypertrophic cardiomyopathy.  Catheterization was done prior to surgical consultation.  Catheterization was done with a 5-French right femoral arterial sheath and a 7-French right femoral venous sheath.  Standard JL-4 and JL-4 catheters were used to engage the coronary simultaneous femoral artery in LV tracings were done with a 4-French pigtail.  Right heart catheterization was done to assess hemodynamics as well as given the patient's dyspnea.  Mean right atrial pressure was 16, RV pressure was 46/19, PA pressure was 45/19, mean pulmonary capillary wedge pressure was 22, LV pressure was 149/27 with a post A-wave EDP of 38, aortic pressure is 142/97. Cardiac output was 5.9 L/minute with a cardiac index of 2.7 L/minute per m squared.  Aortic sat was 90%, PA sat was 63%.  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  Post PVC.  There was also decreased systemic aortic pressure post PVC.  Coronary arteriography.  The patient had a large left dominant coronary artery system.  Left main coronary is normal.  Left anterior descending artery is normal.  Intermediate branch was normal.  First diagonal branch was normal.  Circumflex coronary artery was large and dominant.  First and 2nd obtuse marginal branches were normal.  PDA and posterior lateral branch was normal.  Right coronary artery was small and nondominant.  It was normal.  RAO ventriculography: RAO ventriculography showed hyperdynamic function. EF was 75%.  There is a small gradient across  the aortic valve. at rest.  See above hemodynamic data.  IMPRESSION:  The patient has highly symptomatic hypertrophic cardiomyopathy.  She has very high gradients by echocardiography.  The patient was given an option to be referred to the [facility] or [place].  I believe she needs a Psychologist, sport and exercise with a lot of experience in regards to a septal myectomy.  Her husband is in a wheelchair.  She prefers to go to [place].  I will refer her to Dr. Evelina Dun and Dr. Daiva Huge.  She tolerated the procedure well.     Wallis Bamberg. Johnsie Cancel, MD, [facility]     PCN/MEDQ  D:  05/26/2011  T:  05/26/2011  Job:  [id number]  Electronically Signed by Jenkins Rouge MD [facility] on 05/29/2011 12:26:36 PM

## 2011-05-30 NOTE — Progress Notes (Signed)
RECORDS FORWARDED TO DR Evelina Dun AT [facility]

## 2011-06-05 HISTORY — PX: TRICUSPID VALVE REPLACEMENT: SHX816

## 2011-06-05 HISTORY — PX: VSD REPAIR: SHX276

## 2011-06-12 ENCOUNTER — Encounter: Payer: Medicare Other | Admitting: Cardiovascular Disease

## 2011-06-23 HISTORY — PX: OTHER SURGICAL HISTORY: SHX169

## 2011-07-02 HISTORY — PX: PACEMAKER INSERTION: SHX728

## 2011-07-10 ENCOUNTER — Encounter: Payer: Self-pay | Admitting: Cardiovascular Disease

## 2011-07-12 ENCOUNTER — Encounter: Payer: Self-pay | Admitting: Cardiovascular Disease

## 2011-07-14 ENCOUNTER — Encounter: Payer: Self-pay | Admitting: Cardiovascular Disease

## 2011-07-17 ENCOUNTER — Encounter: Payer: Medicare Other | Admitting: *Deleted

## 2011-07-18 ENCOUNTER — Ambulatory Visit (INDEPENDENT_AMBULATORY_CARE_PROVIDER_SITE_OTHER): Payer: Medicare Other | Admitting: *Deleted

## 2011-07-18 DIAGNOSIS — I4891 Unspecified atrial fibrillation: Secondary | ICD-10-CM | POA: Insufficient documentation

## 2011-07-18 DIAGNOSIS — Z7901 Long term (current) use of anticoagulants: Secondary | ICD-10-CM | POA: Insufficient documentation

## 2011-07-23 ENCOUNTER — Ambulatory Visit (INDEPENDENT_AMBULATORY_CARE_PROVIDER_SITE_OTHER): Payer: Medicare Other | Admitting: *Deleted

## 2011-07-23 DIAGNOSIS — I4891 Unspecified atrial fibrillation: Secondary | ICD-10-CM

## 2011-07-23 DIAGNOSIS — Z7901 Long term (current) use of anticoagulants: Secondary | ICD-10-CM

## 2011-07-23 LAB — POCT INR: INR: 2.8

## 2011-07-30 ENCOUNTER — Other Ambulatory Visit (INDEPENDENT_AMBULATORY_CARE_PROVIDER_SITE_OTHER): Payer: Medicare Other

## 2011-07-30 ENCOUNTER — Ambulatory Visit (INDEPENDENT_AMBULATORY_CARE_PROVIDER_SITE_OTHER): Payer: Medicare Other | Admitting: *Deleted

## 2011-07-30 ENCOUNTER — Ambulatory Visit (INDEPENDENT_AMBULATORY_CARE_PROVIDER_SITE_OTHER): Payer: Medicare Other | Admitting: Internal Medicine

## 2011-07-30 ENCOUNTER — Encounter: Payer: Self-pay | Admitting: Internal Medicine

## 2011-07-30 VITALS — BP 112/62 | HR 80 | Temp 97.8°F | Resp 20

## 2011-07-30 DIAGNOSIS — E876 Hypokalemia: Secondary | ICD-10-CM

## 2011-07-30 DIAGNOSIS — I4891 Unspecified atrial fibrillation: Secondary | ICD-10-CM

## 2011-07-30 DIAGNOSIS — I10 Essential (primary) hypertension: Secondary | ICD-10-CM

## 2011-07-30 DIAGNOSIS — G4700 Insomnia, unspecified: Secondary | ICD-10-CM

## 2011-07-30 DIAGNOSIS — Z7901 Long term (current) use of anticoagulants: Secondary | ICD-10-CM

## 2011-07-30 DIAGNOSIS — N259 Disorder resulting from impaired renal tubular function, unspecified: Secondary | ICD-10-CM

## 2011-07-30 LAB — BASIC METABOLIC PANEL
BUN: 14 mg/dL (ref 6–23)
Calcium: 9.5 mg/dL (ref 8.4–10.5)
Creatinine, Ser: 1.3 mg/dL — ABNORMAL HIGH (ref 0.4–1.2)
GFR: 44.47 mL/min — ABNORMAL LOW (ref >60.00)
Glucose, Bld: 132 mg/dL — ABNORMAL HIGH (ref 70–99)
Potassium: 4.2 mEq/L (ref 3.5–5.1)

## 2011-07-30 LAB — MAGNESIUM: Magnesium: 2 mg/dL (ref 1.5–2.5)

## 2011-07-30 MED ORDER — ZALEPLON 10 MG PO CAPS: 10.0000 mg | ORAL_CAPSULE | Freq: Every day | ORAL | Status: DC

## 2011-07-30 NOTE — Progress Notes (Signed)
Subjective:    Patient ID: Holly Simmons, female    DOB: 1941/03/20, 70 y.o.   MRN: [id number]  HPI She returns for f/up after heart surgery at [facility] over the last month. 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 She was seen by the CT surgeon at [facility] last Friday and had her chest staples removed. She is here today to have her K+ level checked and to get something for insomnia. While in the hospital she tells me that they gave her a few doses of sonata and that she responded well to it.   Review of Systems  Constitutional: Positive for fatigue. Negative for fever, chills, diaphoresis, activity change, appetite change and unexpected weight change.  HENT: Negative.   Eyes: Negative.   Respiratory: Negative for cough, chest tightness, shortness of breath, wheezing and stridor.   Cardiovascular: Negative for chest pain, palpitations and leg swelling.  Gastrointestinal: Negative for nausea, vomiting, abdominal pain, diarrhea, constipation and blood in stool.  Genitourinary: Negative.   Musculoskeletal: Negative for myalgias, back pain, joint swelling, arthralgias and gait problem.  Skin: Negative for color change, pallor, rash and wound.  Neurological: Negative for dizziness, tremors, seizures, syncope, facial asymmetry, speech difficulty, weakness, light-headedness, numbness and headaches.  Hematological: Negative for adenopathy. Does not bruise/bleed easily.  Psychiatric/Behavioral: Positive for sleep disturbance (DFA) and dysphoric mood. Negative for suicidal ideas, hallucinations, behavioral problems, confusion, self-injury, decreased concentration and agitation. The patient is not nervous/anxious and is not hyperactive.        Objective:   Physical Exam  Vitals reviewed. Constitutional: She is oriented to person, place, and time. She appears  well-developed and well-nourished. No distress.  HENT:  Nose: Nose normal.  Mouth/Throat: Oropharynx is clear and moist. No oropharyngeal exudate.  Eyes: Conjunctivae are normal. Right eye exhibits no discharge. Left eye exhibits no discharge. No scleral icterus.  Neck: Normal range of motion. Neck supple. No JVD present. No tracheal deviation present. No thyromegaly present.  Cardiovascular: Normal rate, regular rhythm and intact distal pulses.  Exam reveals no gallop, no S3, no S4 and no friction rub.   Murmur heard.  Systolic murmur is present with a grade of 2/6   No diastolic murmur is present  Pulses:      Carotid pulses are 1+ on the right side, and 1+ on the left side.      Radial pulses are 1+ on the right side, and 1+ on the left side.       Femoral pulses are 1+ on the right side, and 1+ on the left side.      Popliteal pulses are 1+ on the right side, and 1+ on the left side.       Dorsalis pedis pulses are 1+ on the right side, and 1+ on the left side.       Posterior tibial pulses are 1+ on the right side, and 1+ on the left side.  Pulmonary/Chest: Effort normal and breath sounds normal. No stridor. No respiratory distress. She has no wheezes. She has no rales. She exhibits no tenderness.  Abdominal: Soft. Bowel sounds are normal. She exhibits no distension and no mass. There is no tenderness. There is no rebound and no guarding.  Musculoskeletal: Normal range of motion. She exhibits no edema and no tenderness.  Lymphadenopathy:    She has  no cervical adenopathy.  Neurological: She is oriented to person, place, and time.  Skin: Skin is warm. No rash noted. She is not diaphoretic. No erythema. No pallor.  Psychiatric: She has a normal mood and affect. Her behavior is normal. Judgment and thought content normal.     Lab Results  Component Value Date   WBC 6.5 05/14/2011   HGB 12.7 05/14/2011   HCT 37.6 05/14/2011   PLT 199.0 05/14/2011   GLUCOSE 123* 05/14/2011   CHOL  147 05/14/2011   TRIG 76.0 05/14/2011   HDL 53.90 05/14/2011   LDLCALC 78 05/14/2011   ALT 39* 05/14/2011   AST 33 05/14/2011   NA 138 05/14/2011   K 3.9 05/14/2011   CL 102 05/14/2011   CREATININE 1.1 05/14/2011   BUN 19 05/14/2011   CO2 29 05/14/2011   TSH 2.02 05/14/2011   INR 4.7 07/30/2011       Assessment & Plan:

## 2011-07-30 NOTE — Patient Instructions (Signed)

## 2011-07-31 ENCOUNTER — Encounter: Payer: Self-pay | Admitting: Internal Medicine

## 2011-07-31 NOTE — Assessment & Plan Note (Signed)
Her BP is well controlled 

## 2011-07-31 NOTE — Assessment & Plan Note (Signed)
She will try sonata as needed

## 2011-07-31 NOTE — Assessment & Plan Note (Signed)
I will check her K+ and Mg++ levels today 

## 2011-07-31 NOTE — Assessment & Plan Note (Signed)
I will check her renal function today 

## 2011-08-06 ENCOUNTER — Ambulatory Visit (INDEPENDENT_AMBULATORY_CARE_PROVIDER_SITE_OTHER): Payer: Medicare Other | Admitting: *Deleted

## 2011-08-06 DIAGNOSIS — I4891 Unspecified atrial fibrillation: Secondary | ICD-10-CM

## 2011-08-06 DIAGNOSIS — Z7901 Long term (current) use of anticoagulants: Secondary | ICD-10-CM

## 2011-08-07 ENCOUNTER — Ambulatory Visit: Payer: Self-pay | Admitting: Pulmonary Disease

## 2011-08-13 ENCOUNTER — Ambulatory Visit (INDEPENDENT_AMBULATORY_CARE_PROVIDER_SITE_OTHER): Payer: Medicare Other | Admitting: Cardiovascular Disease

## 2011-08-13 ENCOUNTER — Ambulatory Visit (INDEPENDENT_AMBULATORY_CARE_PROVIDER_SITE_OTHER): Payer: Medicare Other | Admitting: *Deleted

## 2011-08-13 ENCOUNTER — Encounter: Payer: Self-pay | Admitting: Cardiovascular Disease

## 2011-08-13 ENCOUNTER — Other Ambulatory Visit: Payer: Self-pay | Admitting: Cardiovascular Disease

## 2011-08-13 DIAGNOSIS — I421 Obstructive hypertrophic cardiomyopathy: Secondary | ICD-10-CM

## 2011-08-13 DIAGNOSIS — Z7901 Long term (current) use of anticoagulants: Secondary | ICD-10-CM

## 2011-08-13 DIAGNOSIS — Q210 Ventricular septal defect: Secondary | ICD-10-CM

## 2011-08-13 DIAGNOSIS — I071 Rheumatic tricuspid insufficiency: Secondary | ICD-10-CM | POA: Insufficient documentation

## 2011-08-13 DIAGNOSIS — Z954 Presence of other heart-valve replacement: Secondary | ICD-10-CM

## 2011-08-13 DIAGNOSIS — I442 Atrioventricular block, complete: Secondary | ICD-10-CM | POA: Insufficient documentation

## 2011-08-13 DIAGNOSIS — I4891 Unspecified atrial fibrillation: Secondary | ICD-10-CM

## 2011-08-13 DIAGNOSIS — I10 Essential (primary) hypertension: Secondary | ICD-10-CM

## 2011-08-13 DIAGNOSIS — I369 Nonrheumatic tricuspid valve disorder, unspecified: Secondary | ICD-10-CM

## 2011-08-13 DIAGNOSIS — I079 Rheumatic tricuspid valve disease, unspecified: Secondary | ICD-10-CM

## 2011-08-13 DIAGNOSIS — Z950 Presence of cardiac pacemaker: Secondary | ICD-10-CM

## 2011-08-13 MED ORDER — SPIRONOLACTONE 12.5 MG HALF TABLET: 12.5000 mg | ORAL_TABLET | Freq: Every day | ORAL | Status: DC

## 2011-08-13 MED ORDER — AMIODARONE HCL 200 MG PO TABS: 200.0000 mg | ORAL_TABLET | Freq: Every day | ORAL | Status: DC

## 2011-08-13 MED ORDER — METOPROLOL SUCCINATE ER 100 MG PO TB24: 100.0000 mg | ORAL_TABLET | Freq: Every day | ORAL | Status: DC

## 2011-08-13 NOTE — Progress Notes (Signed)
F/U for HOCM.      Myovue 2011 normal She has an echo 1/10 with signs of LVH and LVOT gradient. This was not addressed or Rxed at the time. She has mild CRF and sees Dr Clover Mealy. 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 She requires some diuretic for edema despite HOCM She thinks she has had increased bruising in the left arm since then. She denies syncope but does occasionally get dizzy with change in position. . No fever. Denies palpitatons.  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 Last visit increased beta blocker and resumed diuretic qod.  F/U echo still with 3.48m/sec gradient at rest and mild MR basal septum 81mm, Velocity increases to 4.2 m/sec with valsalva  She has significant exertional dyspnea. No way to improve this with medication  Had cath and set up to see [facility] at [facility] for myectomy since surgeons at [facility] due a lot of this type of surgery. Unfortunately things did not go smoothly.  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  Had epicardial leads in and had a pacer placed with pocket in the abdomen.    Had [facility] for atrial flutter and sent home on amiodarone and coumadin.    Spent a long time discussing all these issues with family and patient which was difficult since I was never contacted by Dr Evelina Dun and had To get records while the patient was in the office.    ROS: Denies fever, malais, weight loss, blurry vision, decreased visual acuity, cough, sputum, SOB, hemoptysis, pleuritic pain, palpitaitons, heartburn, abdominal pain, melena, lower extremity edema, claudication, or rash.  All other systems reviewed and negative  General: Affect appropriate Healthy:  appears stated age 71: normal Neck supple with no  adenopathy JVP normal no bruits no thyromegaly Lungs clear with no wheezing and good diaphragmatic motion Heart:  [id number] Systolic  Murmur no ,rub, gallop or click PMI normal  Sternum well healed Abdomen: benighn, BS positve, no tenderness, no AAA no bruit.  No HSM or HJR Distal pulses intact with no bruits No edema Neuro non-focal Skin warm and dry No muscular weakness Pacer pocket in LUQ of abdomen   Current Outpatient Prescriptions  Medication Sig Dispense Refill  . Alum & Mag Hydroxide-Simeth (MAGIC MOUTHWASH) SOLN Take 5 mLs by mouth 3 (three) times daily. 14 days then stop       . amiodarone (PACERONE) 200 MG tablet Take 200 mg by mouth daily.       . Armodafinil (NUVIGIL) 150 MG tablet Take 1 tablet (150 mg total) by mouth daily.  30 tablet  5  . aspirin 81 MG tablet Take 81 mg by mouth daily.        . budesonide-formoterol (SYMBICORT) 160-4.5 MCG/ACT inhaler Inhale 2 puffs into the lungs 2 (two) times daily.        . calcipotriene (DOVONEX) 0.005 % cream Apply topically 2 (two) times daily.  60 g  0  . Cholecalciferol (VITAMIN D) 2000 UNITS tablet Take 2,000 Units by mouth daily.        Marland Kitchen docusate sodium (COLACE) 100 MG capsule Take 100 mg by mouth daily as needed.       . fexofenadine (ALLEGRA) 180 MG tablet Take 180 mg by mouth daily.  prn       . ketoconazole (NIZORAL) 2 % cream Apply topically daily.  15 g  0  . L-Methylfolate-B6-B12 (METANX) 3-35-2 MG TABS Take 1 tablet by mouth 2 (two) times daily.        . LUXIQ 0.12 % foam APPLY TO SCALP AS DIRECTED  100 g  9  . metoprolol (TOPROL-XL) 100 MG 24 hr tablet Take 100 mg by mouth daily.        . Multiple Vitamin (MULTIVITAMIN) tablet Take 1 tablet by mouth daily.        . potassium chloride (KLOR-CON) 10 MEQ CR tablet Take 20 mEq by mouth daily.       . ranitidine (ZANTAC) 300 MG tablet Take 300 mg by mouth at bedtime.        Marland Kitchen spironolactone (ALDACTONE) 12.5 mg TABS Take 12.5 mg by mouth daily.        . Vilazodone HCl  (VIIBRYD) 40 MG TABS Take 1 tablet by mouth daily.  140 tablet  0  . warfarin (COUMADIN) 2 MG tablet Take 2 mg by mouth as directed.        . zaleplon (SONATA) 10 MG capsule Take 1 capsule (10 mg total) by mouth at bedtime.  30 capsule  3    Allergies  Tetanus toxoids; Nsaids; and Sulfonamide derivatives  Electrocardiogram:  Assessment and Plan

## 2011-08-13 NOTE — Assessment & Plan Note (Signed)
Not likely pacer dependant.  Will arrange F/U with pacer clinic  Poscket looks fine.  She indicates Medtronic device

## 2011-08-13 NOTE — Patient Instructions (Signed)
Your physician recommends that you schedule a follow-up appointment in: [place] IMPLANT  MEDTRONIC LOCATED IN STOMACH WITH  EPICARDIAL LEADS Your physician recommends that you continue on your current medications as directed. Please refer to the Current Medication list given to you today. Your physician has requested that you have an echocardiogram. Echocardiography is a painless test that uses sound waves to create images of your heart. It provides your doctor with information about the size and shape of your heart and how well your heart's chambers and valves are working. This procedure takes approximately one hour. There are no restrictions for this procedure. 2 WEEKS DX  VSD CARDIAC REHAB  S/P VSD REPAIR

## 2011-08-13 NOTE — Assessment & Plan Note (Signed)
Post op Will likely discontinue amiodarone in 2 weeks.  ECG from [facility] reviewed and appears to show AV pacing.  With new TV/and VSD patch will need coumadin for at least 4 weeks more

## 2011-08-13 NOTE — Assessment & Plan Note (Signed)
Will try to get remainder of records from [facility].  She still has a significant systolic murmur that sounds more like a residual VSD.  Will get echo Sternum healed well

## 2011-08-13 NOTE — Assessment & Plan Note (Signed)
Tissue TV.  Check echo.  PA pressure likely improved after VSD repair.  SBE prophylaxis.  Epicardial pacer placed in lieu of not wanting to cross Prosthetic TV with wire

## 2011-08-13 NOTE — Telephone Encounter (Signed)
Pt needs refills on the 3 medications listed...amiodarone 200mg  qd; metoprolol 100mg  qd; spironolactone 12.5mg  qd send to [facility] on [address]

## 2011-08-13 NOTE — Assessment & Plan Note (Signed)
Well controlled.  Continue current medications and low sodium Dash type diet.    

## 2011-08-20 ENCOUNTER — Ambulatory Visit: Payer: Medicare Other | Admitting: Internal Medicine

## 2011-08-25 ENCOUNTER — Encounter: Payer: Medicare Other | Admitting: *Deleted

## 2011-08-25 ENCOUNTER — Ambulatory Visit (INDEPENDENT_AMBULATORY_CARE_PROVIDER_SITE_OTHER): Payer: Medicare Other | Admitting: *Deleted

## 2011-08-25 ENCOUNTER — Ambulatory Visit (INDEPENDENT_AMBULATORY_CARE_PROVIDER_SITE_OTHER): Payer: Medicare Other | Admitting: Internal Medicine

## 2011-08-25 ENCOUNTER — Encounter: Payer: Self-pay | Admitting: Internal Medicine

## 2011-08-25 VITALS — BP 110/64 | HR 80 | Temp 97.6°F | Resp 20

## 2011-08-25 DIAGNOSIS — I421 Obstructive hypertrophic cardiomyopathy: Secondary | ICD-10-CM

## 2011-08-25 DIAGNOSIS — R739 Hyperglycemia, unspecified: Secondary | ICD-10-CM

## 2011-08-25 DIAGNOSIS — I4891 Unspecified atrial fibrillation: Secondary | ICD-10-CM

## 2011-08-25 DIAGNOSIS — K219 Gastro-esophageal reflux disease without esophagitis: Secondary | ICD-10-CM

## 2011-08-25 DIAGNOSIS — I10 Essential (primary) hypertension: Secondary | ICD-10-CM

## 2011-08-25 DIAGNOSIS — E118 Type 2 diabetes mellitus with unspecified complications: Secondary | ICD-10-CM | POA: Insufficient documentation

## 2011-08-25 DIAGNOSIS — N259 Disorder resulting from impaired renal tubular function, unspecified: Secondary | ICD-10-CM

## 2011-08-25 DIAGNOSIS — R7309 Other abnormal glucose: Secondary | ICD-10-CM

## 2011-08-25 LAB — PACEMAKER DEVICE OBSERVATION
AL AMPLITUDE: 1.4 mv
AL IMPEDENCE PM: 899 Ohm
AL THRESHOLD: 0.5 V
ATRIAL PACING PM: 100
BAMS-0001: 175 {beats}/min
RV LEAD THRESHOLD: 3.25 V

## 2011-08-25 MED ORDER — RANITIDINE HCL 300 MG PO TABS: 300.0000 mg | ORAL_TABLET | Freq: Every day | ORAL | Status: DC

## 2011-08-25 NOTE — Assessment & Plan Note (Signed)
Her BP is well controlled 

## 2011-08-25 NOTE — Assessment & Plan Note (Signed)
She tells me that she had her pacemaker checked this morning and was given some bad news about the battery life but that all else is well

## 2011-08-25 NOTE — Patient Instructions (Signed)
Hypertension As your heart beats, it forces blood through your arteries. This force is your blood pressure. If the pressure is too high, it is called hypertension (HTN) or high blood pressure. HTN is dangerous because you may have it and not know it. High blood pressure may mean that your heart has to work harder to pump blood. Your arteries may be narrow or stiff. The extra work puts you at risk for heart disease, stroke, and other problems.  Blood pressure consists of two numbers, a higher number over a lower, 110/72, for example. It is stated as "110 over 72." The ideal is below 120 for the top number (systolic) and under 80 for the bottom (diastolic). Write down your blood pressure today. You should pay close attention to your blood pressure if you have certain conditions such as:  Heart failure.   Prior heart attack.   Diabetes   Chronic kidney disease.   Prior stroke.   Multiple risk factors for heart disease.  To see if you have HTN, your blood pressure should be measured while you are seated with your arm held at the level of the heart. It should be measured at least twice. A one-time elevated blood pressure reading (especially in the Emergency Department) does not mean that you need treatment. There may be conditions in which the blood pressure is different between your right and left arms. It is important to see your caregiver soon for a recheck. Most people have essential hypertension which means that there is not a specific cause. This type of high blood pressure may be lowered by changing lifestyle factors such as:  Stress.   Smoking.   Lack of exercise.   Excessive weight.   Drug/tobacco/alcohol use.   Eating less salt.  Most people do not have symptoms from high blood pressure until it has caused damage to the body. Effective treatment can often prevent, delay or reduce that damage. TREATMENT  When a cause has been identified, treatment for high blood pressure is  directed at the cause. There are a large number of medications to treat HTN. These fall into several categories, and your caregiver will help you select the medicines that are best for you. Medications may have side effects. You should review side effects with your caregiver. If your blood pressure stays high after you have made lifestyle changes or started on medicines,   Your medication(s) may need to be changed.   Other problems may need to be addressed.   Be certain you understand your prescriptions, and know how and when to take your medicine.   Be sure to follow up with your caregiver within the time frame advised (usually within two weeks) to have your blood pressure rechecked and to review your medications.   If you are taking more than one medicine to lower your blood pressure, make sure you know how and at what times they should be taken. Taking two medicines at the same time can result in blood pressure that is too low.  SEEK IMMEDIATE MEDICAL CARE IF:  You develop a severe headache, blurred or changing vision, or confusion.   You have unusual weakness or numbness, or a faint feeling.   You have severe chest or abdominal pain, vomiting, or breathing problems.  MAKE SURE YOU:   Understand these instructions.   Will watch your condition.   Will get help right away if you are not doing well or get worse.  Document Released: 07/21/2005 Document Revised: 04/02/2011 Document Reviewed:   03/10/2008 [employer] Patient Information 2012 [place].Gastroesophageal Reflux Disease, Adult Gastroesophageal reflux disease (GERD) happens when acid from your stomach flows up into the esophagus. When acid comes in contact with the esophagus, the acid causes soreness (inflammation) in the esophagus. Over time, GERD may create small holes (ulcers) in the lining of the esophagus. CAUSES   Increased body weight. This puts pressure on the stomach, making acid rise from the stomach into the  esophagus.   Smoking. This increases acid production in the stomach.   Drinking alcohol. This causes decreased pressure in the lower esophageal sphincter (valve or ring of muscle between the esophagus and stomach), allowing acid from the stomach into the esophagus.   Late evening meals and a full stomach. This increases pressure and acid production in the stomach.   A malformed lower esophageal sphincter.  Sometimes, no cause is found. SYMPTOMS   Burning pain in the lower part of the mid-chest behind the breastbone and in the mid-stomach area. This may occur twice a week or more often.   Trouble swallowing.   Sore throat.   Dry cough.   Asthma-like symptoms including chest tightness, shortness of breath, or wheezing.  DIAGNOSIS  Your caregiver may be able to diagnose GERD based on your symptoms. In some cases, X-rays and other tests may be done to check for complications or to check the condition of your stomach and esophagus. TREATMENT  Your caregiver may recommend over-the-counter or prescription medicines to help decrease acid production. Ask your caregiver before starting or adding any new medicines.  HOME CARE INSTRUCTIONS   Change the factors that you can control. Ask your caregiver for guidance concerning weight loss, quitting smoking, and alcohol consumption.   Avoid foods and drinks that make your symptoms worse, such as:   Caffeine or alcoholic drinks.   Chocolate.   Peppermint or mint flavorings.   Garlic and onions.   Spicy foods.   Citrus fruits, such as oranges, lemons, or limes.   Tomato-based foods such as sauce, chili, salsa, and pizza.   Fried and fatty foods.   Avoid lying down for the 3 hours prior to your bedtime or prior to taking a nap.   Eat small, frequent meals instead of large meals.   Wear loose-fitting clothing. Do not wear anything tight around your waist that causes pressure on your stomach.   Raise the head of your bed 6 to 8 inches  with wood blocks to help you sleep. Extra pillows will not help.   Only take over-the-counter or prescription medicines for pain, discomfort, or fever as directed by your caregiver.   Do not take aspirin, ibuprofen, or other nonsteroidal anti-inflammatory drugs (NSAIDs).  SEEK IMMEDIATE MEDICAL CARE IF:   You have pain in your arms, neck, jaw, teeth, or back.   Your pain increases or changes in intensity or duration.   You develop nausea, vomiting, or sweating (diaphoresis).   You develop shortness of breath, or you faint.   Your vomit is green, yellow, black, or looks like coffee grounds or blood.   Your stool is red, bloody, or black.  These symptoms could be signs of other problems, such as heart disease, gastric bleeding, or esophageal bleeding. MAKE SURE YOU:   Understand these instructions.   Will watch your condition.   Will get help right away if you are not doing well or get worse.  Document Released: 04/30/2005 Document Revised: 04/02/2011 Document Reviewed: 02/07/2011 [facility] Patient Information 2012 [place].

## 2011-08-25 NOTE — Assessment & Plan Note (Signed)
This has been stable.

## 2011-08-25 NOTE — Progress Notes (Signed)
  Subjective:    Patient ID: Holly Simmons, female    DOB: 02-Apr-1941, 71 y.o.   MRN: [id number]  Hypertension This is a chronic problem. The current episode started more than 1 year ago. The problem has been gradually improving since onset. The problem is controlled. Associated symptoms include anxiety. Pertinent negatives include no blurred vision, chest pain, headaches, malaise/fatigue, neck pain, orthopnea, palpitations, peripheral edema, PND, shortness of breath or sweats. There are no associated agents to hypertension. Past treatments include beta blockers and diuretics. The current treatment provides significant improvement. Compliance problems include exercise and diet.       Review of Systems  Constitutional: Negative for fever, chills, malaise/fatigue, diaphoresis, activity change, appetite change, fatigue and unexpected weight change.  HENT: Negative for facial swelling and neck pain.   Eyes: Negative.  Negative for blurred vision.  Respiratory: Negative for cough, chest tightness, shortness of breath, wheezing and stridor.   Cardiovascular: Negative for chest pain, palpitations, orthopnea, leg swelling and PND.  Gastrointestinal: Negative for nausea, vomiting, abdominal pain, diarrhea and constipation.  Genitourinary: Negative for dysuria, urgency, frequency, hematuria, flank pain, decreased urine volume, enuresis, difficulty urinating and dyspareunia.  Musculoskeletal: Negative for myalgias, back pain, joint swelling, arthralgias and gait problem.  Skin: Negative for color change, pallor, rash and wound.  Neurological: Negative for dizziness, tremors, seizures, syncope, facial asymmetry, speech difficulty, weakness, light-headedness, numbness and headaches.  Hematological: Negative for adenopathy. Does not bruise/bleed easily.  Psychiatric/Behavioral: Negative.        Objective:   Physical Exam  Vitals reviewed. Constitutional: She is oriented to person, place, and time.  She appears well-developed and well-nourished. No distress.  HENT:  Head: Normocephalic and atraumatic.  Mouth/Throat: Oropharynx is clear and moist. No oropharyngeal exudate.  Eyes: Conjunctivae are normal. Right eye exhibits no discharge. Left eye exhibits no discharge. No scleral icterus.  Neck: Normal range of motion. Neck supple. No tracheal deviation present. No thyromegaly present.  Cardiovascular: Normal rate, regular rhythm and intact distal pulses.  Exam reveals no gallop and no friction rub.   Murmur heard. Pulmonary/Chest: Effort normal and breath sounds normal. No stridor. No respiratory distress. She has no wheezes. She has no rales. She exhibits no tenderness.  Abdominal: Soft. Bowel sounds are normal. She exhibits no distension and no mass. There is no tenderness. There is no rebound and no guarding.  Musculoskeletal: Normal range of motion. She exhibits no edema and no tenderness.  Lymphadenopathy:    She has no cervical adenopathy.  Neurological: She is oriented to person, place, and time.  Skin: Skin is warm and dry. No rash noted. She is not diaphoretic. No erythema. No pallor.  Psychiatric: She has a normal mood and affect. Her behavior is normal. Judgment and thought content normal.          Lab Results  Component Value Date   WBC 6.5 05/14/2011   HGB 12.7 05/14/2011   HCT 37.6 05/14/2011   PLT 199.0 05/14/2011   GLUCOSE 132* 07/30/2011   CHOL 147 05/14/2011   TRIG 76.0 05/14/2011   HDL 53.90 05/14/2011   LDLCALC 78 05/14/2011   ALT 39* 05/14/2011   AST 33 05/14/2011   NA 141 07/30/2011   K 4.2 07/30/2011   CL 105 07/30/2011   CREATININE 1.3* 07/30/2011   BUN 14 07/30/2011   CO2 28 07/30/2011   TSH 2.02 05/14/2011   INR 2.3 08/13/2011   Assessment & Plan:

## 2011-08-25 NOTE — Assessment & Plan Note (Signed)
Continue zantac prn

## 2011-08-27 ENCOUNTER — Ambulatory Visit (INDEPENDENT_AMBULATORY_CARE_PROVIDER_SITE_OTHER): Payer: Medicare Other | Admitting: *Deleted

## 2011-08-27 ENCOUNTER — Encounter: Payer: Medicare Other | Admitting: *Deleted

## 2011-08-27 ENCOUNTER — Ambulatory Visit (HOSPITAL_COMMUNITY): Payer: Medicare Other | Attending: Cardiology | Admitting: Radiology

## 2011-08-27 DIAGNOSIS — I510 Cardiac septal defect, acquired: Secondary | ICD-10-CM

## 2011-08-27 DIAGNOSIS — I421 Obstructive hypertrophic cardiomyopathy: Secondary | ICD-10-CM | POA: Insufficient documentation

## 2011-08-27 DIAGNOSIS — I10 Essential (primary) hypertension: Secondary | ICD-10-CM | POA: Insufficient documentation

## 2011-08-27 DIAGNOSIS — I4891 Unspecified atrial fibrillation: Secondary | ICD-10-CM | POA: Insufficient documentation

## 2011-08-27 DIAGNOSIS — Z954 Presence of other heart-valve replacement: Secondary | ICD-10-CM | POA: Insufficient documentation

## 2011-08-27 DIAGNOSIS — I369 Nonrheumatic tricuspid valve disorder, unspecified: Secondary | ICD-10-CM

## 2011-08-27 DIAGNOSIS — Z7901 Long term (current) use of anticoagulants: Secondary | ICD-10-CM

## 2011-08-27 DIAGNOSIS — I252 Old myocardial infarction: Secondary | ICD-10-CM | POA: Insufficient documentation

## 2011-08-27 DIAGNOSIS — Q210 Ventricular septal defect: Secondary | ICD-10-CM

## 2011-08-28 ENCOUNTER — Ambulatory Visit (INDEPENDENT_AMBULATORY_CARE_PROVIDER_SITE_OTHER): Payer: Medicare Other | Admitting: Internal Medicine

## 2011-08-28 ENCOUNTER — Encounter: Payer: Self-pay | Admitting: Internal Medicine

## 2011-08-28 DIAGNOSIS — I421 Obstructive hypertrophic cardiomyopathy: Secondary | ICD-10-CM

## 2011-08-28 DIAGNOSIS — I442 Atrioventricular block, complete: Secondary | ICD-10-CM

## 2011-08-28 DIAGNOSIS — I4891 Unspecified atrial fibrillation: Secondary | ICD-10-CM

## 2011-08-29 ENCOUNTER — Telehealth: Payer: Self-pay | Admitting: Internal Medicine

## 2011-08-29 ENCOUNTER — Other Ambulatory Visit: Payer: Self-pay | Admitting: Internal Medicine

## 2011-08-29 ENCOUNTER — Ambulatory Visit (HOSPITAL_COMMUNITY)
Admission: RE | Admit: 2011-08-29 | Discharge: 2011-08-29 | Disposition: A | Discharge status: Home / Self Care | Payer: Medicare Other | Source: Ambulatory Visit | Attending: Internal Medicine | Admitting: Internal Medicine

## 2011-08-29 DIAGNOSIS — I442 Atrioventricular block, complete: Secondary | ICD-10-CM

## 2011-08-29 DIAGNOSIS — Z950 Presence of cardiac pacemaker: Secondary | ICD-10-CM | POA: Insufficient documentation

## 2011-08-29 DIAGNOSIS — I517 Cardiomegaly: Secondary | ICD-10-CM | POA: Insufficient documentation

## 2011-08-29 DIAGNOSIS — Z954 Presence of other heart-valve replacement: Secondary | ICD-10-CM

## 2011-08-29 NOTE — Telephone Encounter (Signed)
FU Call: Pt returning call from our office. Please call back. Pt orginally thought msg was from Dr. Rayann Heman nurse and now thinks call was from Dr. Kyla Balzarine nurse.

## 2011-08-29 NOTE — Telephone Encounter (Signed)
Pt calling re letting you know she had her xrays done today

## 2011-08-29 NOTE — Telephone Encounter (Signed)
FU Call: Pt returning call from our office. Pt doesn't know who called, nor what they were calling about. Please return pt call to discuss further if necessary.

## 2011-08-29 NOTE — Telephone Encounter (Signed)
PT AWARE OF ECHO RESULTS./CY 

## 2011-09-01 NOTE — Telephone Encounter (Signed)
Spoke with patient.  Dr Rayann Heman reviewed X-rays, stable lead position and can position.  He spoke with Dr Westley Gambles at [facility] who stated that thresholds were elevated at implant and that this is not a new issue for patient.  Dr Rayann Heman discussed with Dr Johnsie Cancel also.  Plan to ROV in 3 months with Dr Rayann Heman.  Patient was reassured today about normal device function and that there is no change in device numbers since implant.  Advised that Dr Rayann Heman would like her to recover from her surgery before talking about any invasive procedures.  Appt made with Dr Rayann Heman in April for pacemaker interrogation and discussion of further plans.  Patient aware and agrees with above.

## 2011-09-03 ENCOUNTER — Encounter: Payer: Self-pay | Admitting: Internal Medicine

## 2011-09-03 NOTE — Assessment & Plan Note (Signed)
Stable No changes today As above

## 2011-09-03 NOTE — Assessment & Plan Note (Signed)
Maintaining sinus rhythm at this time On coumadin

## 2011-09-03 NOTE — Assessment & Plan Note (Signed)
Today, the patient is referred to establish care in the EP device clinic.  I am asked to see her urgently by my pacing team due to high RV pacing threshold.  This turned out to be a prolonged and complicated visit today.   I have reviewed her extensive records from [facility] in [place].  Unfortunately, an operative note from pacemaker implantation was not available.  It appears that she had epicardial pacing leads placed with tunneling to an abdominal can. I spoke with Dr Glenice Laine at [facility] extensively about this patient.  He recalls her implant well.  Apparently, Dr Marcello Moores and he were both involved in her care.  She had 2 ventricular pacing wires placed, however only one was felt to be usable and the other had a "very high threshold".  The ventricular lead that they decided to use had a threshold of 3.5V @0 .54ms at time of implant.  (She was apparently very sick at time of implant).   The lead measurements that have subsequently been obtained by our practice are felt by Dr Westley Gambles to be consistent with numbers from implantation.  I will obtain a CXR and KUB today to better evaluate lead placement. As her threshold is presently, stable, I do not think that there is an urgent indication to revise her system (and Dr Westley Gambles agrees).  I have asked that he forward his operative note to my office for review.  I would however at next visit with the patient discuss placing a new transvenous system.  I would consider placing her V lead in the coronary sinus for ventricular pacing.  Given extensive surgical manipulation of the septum and tricuspid valve, I am however concerned that her CS Ostium may have been displaced significantly or possibly oversewn.  I will consider obtaining a cardiac CT prior to the procedure to better characterize the CS anatomy.  IF she does not have an approachable CS, then potentially a ventricular lead could be placed through the replaced TV as this is not a mechanical valve, though I would like  to avoid this if possible.  She will return to my office in 3 months. Time spent in counseling pt, reviewing records, and speaking with Drs Westley Gambles and Johnsie Cancel about this patient was 1 hour.

## 2011-09-03 NOTE — Progress Notes (Signed)
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  She developed post operative AV block and therefore had an epicardial pacing system implanted with an abdominal pacemaker pocket formed by Drs Westley Gambles and Marcello Moores at [place].  Upon my discussion today with Dr Westley Gambles, she had two sets of epicardial wires placed.  One set had a "very high threshold and could not be used".  The other also had a threshold of 3.5V @0 .67ms at implant.  It was decided by he and Dr Marcello Moores to use this lead recognizing that pacemaker longevity would be poor long term.  Per his report, she was quite ill at time of implantation. bradycardia sp PPM (MDT) by Dr Doreatha Lew  who presents today to establish care in the Electrophysiology device clinic.   The patient reports doing reasonably well since having a pacemaker implanted.  She is making a slow post operative recovery.   Today, she  denies symptoms of palpitations, chest pain, shortness of breath, orthopnea, PND, lower extremity edema, dizziness, presyncope, syncope, or neurologic sequela.  She reports mild "burning" over her pacemaker site for several minutes 2 days ago but denies fevers, chills, draininage, or any other concerns.   The patientis tolerating medications without difficulties and is otherwise without complaint today.   Past Medical History  Diagnosis Date  . Hypertension   . Hyperpotassemia   . Asthma     NOS w/ acute exacerbation  . Hypersomnia   . Depressive disorder   . Diastolic dysfunction   . Pyuria   . Renal insufficiency   . GERD (gastroesophageal reflux disease)   . COPD (chronic obstructive pulmonary disease)   . Obstructive sleep apnea     persistent daytime sleepiness despite cpap  . Allergic rhinitis   . Renal insufficiency   .  Psoriasis   . Hypertrophic cardiomyopathy     s/p surgical myomectomy at [facility] [id number] complicated by septal VSD post procedure requiring reoperation with patch closure and tricuspid valve replacement  . Tricuspid valve replaced   . Complete heart block     requiring PPM (MDT) post surgical myomectomy at [facility],  leads are epicardial with abdominal implant, high ventricular threshold at implant   Past Surgical History  Procedure Date  . Appendectomy   . Cholecystectomy   . Tonsillectomy   . Cesarean section   . Abdominal hysterectomy   . Septal myomectomy for hypertrophic cm 12/12    by Dr Evelina Dun at [facility], complicated by septal VSD requiring patch repair and tricuspid valve replacement  . Tricuspid valve replacement 2012    MDT tissue valve  . Vsd repair 2012  . Pacemaker insertion 12/12    epicardial wires with abdominal implant at [facility] 12/12,  high ventricular lead threshold at implant per Dr Westley Gambles    History   Social History  . Marital Status: Married    Spouse Name: [name]    Number of Children: 2  . Years of Education: N/A   Occupational History  .     Social History Main Topics  . Smoking status: Never Smoker   . Smokeless tobacco: Not on file  . Alcohol Use: No  . Drug Use: No  . Sexually Active: Not Currently   Other Topics Concern  . Not on file   Social History Narrative  .  No narrative on file    Family History  Problem Relation Age of Onset  . Cancer Father     bladder  . Cancer Maternal Aunt     ovarian  . Heart disease Maternal Grandfather   . Heart disease Paternal Grandfather   . Cancer Other     breast  . Dementia Other   . Hypertension Other     Allergies  Allergen Reactions  . Tetanus Toxoids Swelling  . Nsaids     REACTION: proteinuria  . Sulfonamide Derivatives     Current Outpatient Prescriptions  Medication Sig Dispense Refill  . Alum & Mag Hydroxide-Simeth (MAGIC MOUTHWASH) SOLN Take 5 mLs by mouth 3 (three) times daily. 14  days then stop       . amiodarone (PACERONE) 200 MG tablet Take 1 tablet (200 mg total) by mouth daily.  30 tablet  11  . Armodafinil (NUVIGIL) 150 MG tablet Take 1 tablet (150 mg total) by mouth daily.  30 tablet  5  . aspirin 81 MG tablet Take 81 mg by mouth daily.        . budesonide-formoterol (SYMBICORT) 160-4.5 MCG/ACT inhaler Inhale 2 puffs into the lungs 2 (two) times daily.        . calcipotriene (DOVONEX) 0.005 % cream Apply topically 2 (two) times daily.  60 g  0  . Cholecalciferol (VITAMIN D) 2000 UNITS tablet Take 2,000 Units by mouth daily.        Marland Kitchen docusate sodium (COLACE) 100 MG capsule Take 100 mg by mouth daily as needed.       . fexofenadine (ALLEGRA) 180 MG tablet Take 180 mg by mouth daily. prn       . ketoconazole (NIZORAL) 2 % cream Apply topically daily.  15 g  0  . L-Methylfolate-B6-B12 (METANX) 3-35-2 MG TABS Take 1 tablet by mouth 2 (two) times daily.        . LUXIQ 0.12 % foam APPLY TO SCALP AS DIRECTED  100 g  9  . metoprolol (TOPROL-XL) 100 MG 24 hr tablet Take 1 tablet (100 mg total) by mouth daily.  30 tablet  11  . Multiple Vitamin (MULTIVITAMIN) tablet Take 1 tablet by mouth daily.        . potassium chloride (KLOR-CON) 10 MEQ CR tablet Take 20 mEq by mouth daily.       . ranitidine (ZANTAC) 300 MG tablet Take 1 tablet (300 mg total) by mouth at bedtime.  90 tablet  3  . spironolactone (ALDACTONE) 12.5 mg TABS Take 0.5 tablets (12.5 mg total) by mouth daily.  15 tablet  11  . Vilazodone HCl (VIIBRYD) 40 MG TABS Take 1 tablet by mouth daily.  140 tablet  0  . warfarin (COUMADIN) 2 MG tablet Take 2 mg by mouth as directed.          ROS- all systems are reviewed and negative except as per HPI  Physical Exam: Filed Vitals:   08/28/11 1556  BP: 104/62  Pulse: 80  Height: 5\' 3"  (1.6 m)  Weight: 193 lb (87.544 kg)    GEN- The patient is obese appearing, alert and oriented x 3 today.   Head- normocephalic, atraumatic Eyes-  Sclera clear, conjunctiva  pink Ears- hearing intact Oropharynx- clear Neck- supple, no JVP Lymph- no cervical lymphadenopathy Lungs- Clear to ausculation bilaterally, normal work of breathing Pacemaker- abdominal pacemaker pocket is well healed Heart- Regular rate and rhythm  GI- soft, NT, ND, + BS Extremities- no  clubbing, cyanosis, trace edema MS- no significant deformity or atrophy Skin- no rash or lesion Psych- euthymic mood, full affect Neuro- strength and sensation are intact  Pacemaker interrogation- reviewed in detail today,  See PACEART report  Assessment and Plan:

## 2011-09-08 ENCOUNTER — Telehealth: Payer: Self-pay | Admitting: Cardiovascular Disease

## 2011-09-08 NOTE — Telephone Encounter (Signed)
Pt called wanting number to cardiac rehab.  I gave it to her.

## 2011-09-08 NOTE — Telephone Encounter (Signed)
New Msg: pt calling stating she is waiting on call to receive cardiac rehab. Pt was told they would call within one week; it has now been two weeks and pt has not received a call. Pt said she doesn't know have cardiac rehabs number to call them. Please return pt call to discuss further.

## 2011-09-11 ENCOUNTER — Encounter (HOSPITAL_COMMUNITY)
Admission: RE | Admit: 2011-09-11 | Discharge: 2011-09-11 | Disposition: A | Discharge status: Home / Self Care | Payer: Medicare Other | Source: Ambulatory Visit | Attending: Cardiovascular Disease | Admitting: Cardiovascular Disease

## 2011-09-11 ENCOUNTER — Encounter (HOSPITAL_COMMUNITY): Payer: Self-pay

## 2011-09-11 DIAGNOSIS — Z9889 Other specified postprocedural states: Secondary | ICD-10-CM | POA: Insufficient documentation

## 2011-09-11 DIAGNOSIS — I442 Atrioventricular block, complete: Secondary | ICD-10-CM | POA: Insufficient documentation

## 2011-09-11 DIAGNOSIS — F329 Major depressive disorder, single episode, unspecified: Secondary | ICD-10-CM | POA: Insufficient documentation

## 2011-09-11 DIAGNOSIS — I252 Old myocardial infarction: Secondary | ICD-10-CM | POA: Insufficient documentation

## 2011-09-11 DIAGNOSIS — J4489 Other specified chronic obstructive pulmonary disease: Secondary | ICD-10-CM | POA: Insufficient documentation

## 2011-09-11 DIAGNOSIS — F3289 Other specified depressive episodes: Secondary | ICD-10-CM | POA: Insufficient documentation

## 2011-09-11 DIAGNOSIS — Q210 Ventricular septal defect: Secondary | ICD-10-CM | POA: Insufficient documentation

## 2011-09-11 DIAGNOSIS — J449 Chronic obstructive pulmonary disease, unspecified: Secondary | ICD-10-CM | POA: Insufficient documentation

## 2011-09-11 DIAGNOSIS — I079 Rheumatic tricuspid valve disease, unspecified: Secondary | ICD-10-CM | POA: Insufficient documentation

## 2011-09-11 DIAGNOSIS — Q254 Congenital malformation of aorta unspecified: Secondary | ICD-10-CM | POA: Insufficient documentation

## 2011-09-11 DIAGNOSIS — Z5189 Encounter for other specified aftercare: Secondary | ICD-10-CM | POA: Insufficient documentation

## 2011-09-11 DIAGNOSIS — I4891 Unspecified atrial fibrillation: Secondary | ICD-10-CM | POA: Insufficient documentation

## 2011-09-11 DIAGNOSIS — I422 Other hypertrophic cardiomyopathy: Secondary | ICD-10-CM | POA: Insufficient documentation

## 2011-09-11 DIAGNOSIS — I10 Essential (primary) hypertension: Secondary | ICD-10-CM | POA: Insufficient documentation

## 2011-09-11 DIAGNOSIS — G4733 Obstructive sleep apnea (adult) (pediatric): Secondary | ICD-10-CM | POA: Insufficient documentation

## 2011-09-12 ENCOUNTER — Telehealth: Payer: Self-pay | Admitting: Cardiovascular Disease

## 2011-09-12 NOTE — Telephone Encounter (Signed)
PT AWARE  MAY  PARTICIPATE IN REHAB. CONT TO MONITOR SYMPTOMS IF NO IMPROVEMENT TO CALL  OFFICE NEXT WEEK  ALSO IF S/S WORSEN OVER WEEKEND  TO GO TO ER FOR EVAL AND TX./CY

## 2011-09-12 NOTE — Telephone Encounter (Signed)
Ok to start rehab

## 2011-09-12 NOTE — Telephone Encounter (Signed)
New Problem   Patient had heart surgery 06/2012 [facility])  Currently experiencing shortness of breath, concerned as she doesn't feel good.    F/U previously sched for 09/25/11   Please return call to patient at [phone]

## 2011-09-12 NOTE — Telephone Encounter (Signed)
Fu call Patient is returning your call

## 2011-09-12 NOTE — Telephone Encounter (Signed)
PT CALLED  COMPLAINING OF SOB  FOR 2 DAYS FEELS LIKE BRICK LAYING CHEST  NO OTHER SYMPTOMS HAS SWELLING IN ANKLES BUT THIS IS NOT NEW  ALSO NO CHANGE IN DIET . B/P YESTERDAY  WAS 120 /? NO IDEA WHAT HEART IS  DOESN'T  FEEL THAT HEART IS OUT OF RHYTHM. PT IS DUE TO START REHAB NEXT WEEK HAS CONCERNS  SINCE IS NOT FEELING WELL PT AWARE  WILL DISCUSS WITH DR Johnsie Cancel .

## 2011-09-15 ENCOUNTER — Encounter (HOSPITAL_COMMUNITY)
Admission: RE | Admit: 2011-09-15 | Discharge: 2011-09-15 | Disposition: A | Discharge status: Home / Self Care | Payer: Medicare Other | Source: Ambulatory Visit | Attending: Cardiovascular Disease | Admitting: Cardiovascular Disease

## 2011-09-15 ENCOUNTER — Ambulatory Visit (INDEPENDENT_AMBULATORY_CARE_PROVIDER_SITE_OTHER): Payer: Medicare Other | Admitting: Pharmacist

## 2011-09-15 DIAGNOSIS — Z7901 Long term (current) use of anticoagulants: Secondary | ICD-10-CM

## 2011-09-15 DIAGNOSIS — I4891 Unspecified atrial fibrillation: Secondary | ICD-10-CM

## 2011-09-15 LAB — POCT INR: INR: 1.9

## 2011-09-16 NOTE — Progress Notes (Signed)
Pt started cardiac rehab today.  Pt tolerated light exercise without difficulty. Telemetry rhythm paced.  Will continue to monitor the patient throughout  the program.

## 2011-09-17 ENCOUNTER — Encounter: Payer: Medicare Other | Admitting: *Deleted

## 2011-09-17 ENCOUNTER — Encounter (HOSPITAL_COMMUNITY)
Admission: RE | Admit: 2011-09-17 | Discharge: 2011-09-17 | Disposition: A | Discharge status: Home / Self Care | Payer: Medicare Other | Source: Ambulatory Visit | Attending: Cardiovascular Disease | Admitting: Cardiovascular Disease

## 2011-09-19 ENCOUNTER — Encounter (HOSPITAL_COMMUNITY)
Admission: RE | Admit: 2011-09-19 | Discharge: 2011-09-19 | Disposition: A | Discharge status: Home / Self Care | Payer: Medicare Other | Source: Ambulatory Visit | Attending: Cardiovascular Disease | Admitting: Cardiovascular Disease

## 2011-09-19 NOTE — Progress Notes (Signed)
Reviewed home exercise with pt today.  Pt plans to walk at home or store for exercise.  Reviewed THR, pulse (needs practice), RPE, sign and symptoms, and when to call 911 or MD.  Pt voiced understanding. Alberteen Sam, MA, ACSM RCEP

## 2011-09-22 ENCOUNTER — Encounter (HOSPITAL_COMMUNITY)
Admission: RE | Admit: 2011-09-22 | Discharge: 2011-09-22 | Disposition: A | Discharge status: Home / Self Care | Payer: Medicare Other | Source: Ambulatory Visit | Attending: Cardiovascular Disease | Admitting: Cardiovascular Disease

## 2011-09-24 ENCOUNTER — Encounter (HOSPITAL_COMMUNITY)
Admission: RE | Admit: 2011-09-24 | Discharge: 2011-09-24 | Disposition: A | Discharge status: Home / Self Care | Payer: Medicare Other | Source: Ambulatory Visit | Attending: Cardiovascular Disease | Admitting: Cardiovascular Disease

## 2011-09-24 ENCOUNTER — Ambulatory Visit: Payer: Medicare Other | Admitting: Cardiovascular Disease

## 2011-09-25 ENCOUNTER — Ambulatory Visit (INDEPENDENT_AMBULATORY_CARE_PROVIDER_SITE_OTHER): Payer: Medicare Other | Admitting: Cardiovascular Disease

## 2011-09-25 ENCOUNTER — Encounter: Payer: Self-pay | Admitting: Cardiovascular Disease

## 2011-09-25 DIAGNOSIS — I079 Rheumatic tricuspid valve disease, unspecified: Secondary | ICD-10-CM

## 2011-09-25 DIAGNOSIS — I4891 Unspecified atrial fibrillation: Secondary | ICD-10-CM

## 2011-09-25 DIAGNOSIS — I10 Essential (primary) hypertension: Secondary | ICD-10-CM

## 2011-09-25 DIAGNOSIS — I422 Other hypertrophic cardiomyopathy: Secondary | ICD-10-CM

## 2011-09-25 DIAGNOSIS — F329 Major depressive disorder, single episode, unspecified: Secondary | ICD-10-CM

## 2011-09-25 DIAGNOSIS — I071 Rheumatic tricuspid insufficiency: Secondary | ICD-10-CM

## 2011-09-25 DIAGNOSIS — Q254 Congenital malformation of aorta unspecified: Secondary | ICD-10-CM

## 2011-09-25 DIAGNOSIS — Q210 Ventricular septal defect: Secondary | ICD-10-CM

## 2011-09-25 DIAGNOSIS — F3289 Other specified depressive episodes: Secondary | ICD-10-CM

## 2011-09-25 DIAGNOSIS — I442 Atrioventricular block, complete: Secondary | ICD-10-CM

## 2011-09-25 NOTE — Assessment & Plan Note (Signed)
Improving a bit.  Continue Vibryd

## 2011-09-25 NOTE — Patient Instructions (Signed)
Your physician wants you to follow-up in: July 2013 WITH DR Blima Singer will receive a reminder letter in the mail two months in advance. If you don't receive a letter, please call our office to schedule the follow-up appointment. Your physician recommends that you continue on your current medications as directed. Please refer to the Current Medication list given to you today.

## 2011-09-25 NOTE — Assessment & Plan Note (Signed)
F/U Dr Rayann Heman April.  He will discuss possible need for coronary sinus pacing.  Interrogation shows >80% AV pacing  Unipolar pace due to high threshholds.

## 2011-09-25 NOTE — Progress Notes (Signed)
Donyetta has an appointment with Dr Johnsie Cancel this afternoon. Exercise scanned via Environmental health practitioner for review.

## 2011-09-25 NOTE — Assessment & Plan Note (Signed)
Tissue TVR normal function by echo.  No transvenous RV lead due to this

## 2011-09-25 NOTE — Assessment & Plan Note (Signed)
Not sure of natural history for this.  Not uncommon to have residual left to right shunt with infarct VSD repair.  Cannot do Qp/Qs by MRI due to pacer.  SBE prophylaxis.

## 2011-09-25 NOTE — Progress Notes (Signed)
Holly Simmons 71 y.o. female       Nutrition Screen                                                                    YES  NO Do you live in a nursing home?  X   Do you eat out more than 3 times/week?   X  If yes, how many times per week do you eat out? 4  Do you have food allergies?   X If yes, what are you allergic to?  Have you gained or lost more than 10 lbs without trying?               X If yes, how much weight have you lost and over what time period?  lbs gained or lost over  weeks/month  Do you want to lose weight?    X  If yes, what is a goal weight or amount of weight you would like to lose? 50 lb  Do you eat alone most of the time?   X   Do you eat less than 2 meals/day?  X If yes, how many meals do you eat?  Do you drink more than 3 alcohol drinks/day?  X If yes, how many drinks per day?  Are you having trouble with constipation? *  X If yes, what are you doing to help relieve constipation?  Do you have financial difficulties with buying food?*    X   Are you experiencing regular nausea/ vomiting?*     X   Do you have a poor appetite? *                                       X    Do you have trouble chewing/swallowing? *   X    Pt with diagnoses of:  X AVR/MVR/ICD      X %  Body fat >goal / Body Mass Index >25 X HTN / BP >120/80 X MI       Pt Risk Score    7       Diagnosis Risk Score  20       Total Risk Score   27                         High Risk               X Low Risk    HT: 65" Ht Readings from Last 1 Encounters:  09/11/11 5\' 5"  (1.651 m)    WT:   198.9 lb (90.4 kg) Wt Readings from Last 3 Encounters:  09/11/11 199 lb 4.7 oz (90.4 kg)  08/28/11 193 lb (87.544 kg)  08/13/11 192 lb (87.091 kg)     IBW 56.8 159%IBW BMI 33.2 45.1%body fat  Meds reviewed: vitamin D, L-methylfolate, MVI, Potassium Chloride, Warfarin Past Medical History  Diagnosis Date  . Hypertension   . Hyperpotassemia   . Asthma     NOS w/ acute exacerbation  . Hypersomnia   .  Depressive disorder   . Diastolic dysfunction   . Pyuria   . Renal  insufficiency   . GERD (gastroesophageal reflux disease)   . COPD (chronic obstructive pulmonary disease)   . Obstructive sleep apnea     persistent daytime sleepiness despite cpap  . Allergic rhinitis   . Renal insufficiency   . Psoriasis   . Hypertrophic cardiomyopathy     s/p surgical myomectomy at [facility] 0000000 complicated by septal VSD post procedure requiring reoperation with patch closure and tricuspid valve replacement  . Tricuspid valve replaced     MDT 38mm Mosaic Valve  . Complete heart block     requiring PPM (MDT) post surgical myomectomy at [facility],  leads are epicardial with abdominal implant, high ventricular threshold at implant       Activity level: Pt is sedentary   Wt goal: 174-186 lb ( 79.1-84.5 kg) Current tobacco use? No      Food/Drug Interaction? Yes If Y, which med(s)? Coumadin If yes, pt given Food/Drug Interaction handout? Yes  Labs:  Lipid Panel     Component Value Date/Time   CHOL 147 05/14/2011 0915   TRIG 76.0 05/14/2011 0915   HDL 53.90 05/14/2011 0915   CHOLHDL 3 05/14/2011 0915   VLDL 15.2 05/14/2011 0915   LDLCALC 78 05/14/2011 0915   No results found for this basename: HGBA1C   07/30/11 Glucose 132  LDL goal: < 70      MI and > 2:      HTN, >71 yo female Estimated Daily Nutrition Needs for: ? wt loss  1200-1700 Kcal , Total Fat 30-45gm, Saturated Fat 8-13 gm, Trans Fat 1.1-1.7 gm,  Sodium less than 1500 mg

## 2011-09-25 NOTE — Progress Notes (Signed)
Patient ID: Holly Simmons, female   DOB: 02-May-1941, 71 y.o.   MRN: [id number] F/U post HOCM surgery at [facility]  Had cath and set up to see Glower at [facility] for myectomy since surgeons at [facility] due a lot of this type of surgery.  Unfortunately things did not go smoothly. 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 Had epicardial leads in and had a pacer placed with pocket in the abdomen.  Had [facility] for atrial flutter and sent home on amiodarone and coumadin.   F/U with Dr Rayann Heman as epicardial leads have high thresshold and apparantly were like that post op.  F/U echo 08/27/11  - Normal LV size. The patient is staus post septal myectomy complicated by VSD and has now had VSD patch repair. The patch covers the basal septal wall and leads to akinesis of the wall within that area. There is a small to moderate residual VSD at the apical end of the patch. LV EF estimated at 50%. Moderate diastolic dysfunction wtih evidence for elevated LV filling pressure. There is no LV outflow tract gradient or SAM. Trivial MR. The RV is normal in size and systolic function. There is a bioprosthetic tricuspid valve with mildly elevated mean gradient.  Still teary eyed about complications.  Participating in rehab  Reviewed their notes and her hemodynamics and sats are fine but limited functional reserve  Interogated pacer and no afib  And no mode switch.  Will stop amiodarone and coumadin  ROS: Denies fever, malais, weight loss, blurry vision, decreased visual acuity, cough, sputum, SOB, hemoptysis, pleuritic pain, palpitaitons, heartburn, abdominal pain, melena, lower extremity edema, claudication, or rash.  All other systems reviewed and negative  General: Affect  depressed Healthy:  appears stated age 71: normal Neck supple with no adenopathy JVP normal no bruits no thyromegaly Lungs clear with no wheezing and good diaphragmatic motion Heart:  S1/S2 VSD  murmur, no rub, gallop or click PMI normal Abdomen: benighn, BS positve, no tenderness, no AAA pacer in LLQ no bruit.  No HSM or HJR Distal pulses intact with no bruits No edema Neuro non-focal Skin warm and dry No muscular weakness   Current Outpatient Prescriptions  Medication Sig Dispense Refill  . Alum & Mag Hydroxide-Simeth (MAGIC MOUTHWASH) SOLN Take 5 mLs by mouth 3 (three) times daily. 14 days then stop       . amiodarone (PACERONE) 200 MG tablet Take 1 tablet (200 mg total) by mouth daily.  30 tablet  11  . Armodafinil (NUVIGIL) 150 MG tablet Take 1 tablet (150 mg total) by mouth daily.  30 tablet  5  . aspirin 81 MG tablet Take 81 mg by mouth daily.        . budesonide-formoterol (SYMBICORT) 160-4.5 MCG/ACT inhaler Inhale 2 puffs into the lungs 2 (two) times daily.        . calcipotriene (DOVONEX) 0.005 % cream Apply topically 2 (two) times daily.  60 g  0  . Cholecalciferol (VITAMIN D) 2000 UNITS tablet Take 2,000 Units by mouth daily.        Marland Kitchen docusate sodium (COLACE) 100 MG capsule Take 100 mg by mouth daily as needed.       . fexofenadine (ALLEGRA) 180 MG tablet Take 180 mg by mouth daily. prn       .  ketoconazole (NIZORAL) 2 % cream Apply topically daily.  15 g  0  . L-Methylfolate-B6-B12 (METANX) 3-35-2 MG TABS Take 1 tablet by mouth 2 (two) times daily.        . LUXIQ 0.12 % foam APPLY TO SCALP AS DIRECTED  100 g  9  . metoprolol (TOPROL-XL) 100 MG 24 hr tablet Take 1 tablet (100 mg total) by mouth daily.  30 tablet  11  . potassium chloride (KLOR-CON) 10 MEQ CR tablet Take 20 mEq by mouth daily.       . ranitidine (ZANTAC) 300 MG tablet Take 1 tablet (300 mg total) by mouth at bedtime.  90 tablet  3  . spironolactone (ALDACTONE) 12.5 mg TABS Take 0.5 tablets (12.5 mg  total) by mouth daily.  15 tablet  11  . Vilazodone HCl (VIIBRYD) 40 MG TABS Take 1 tablet by mouth daily.  140 tablet  0  . warfarin (COUMADIN) 2 MG tablet Take 2 mg by mouth as directed.          Allergies  Tetanus toxoids; Nsaids; and Sulfonamide derivatives  Electrocardiogram:  Assessment and Plan

## 2011-09-25 NOTE — Assessment & Plan Note (Signed)
No LVOT gradient.  No signifcant MR post surgery.  Continued diastolic dysfunction and complications from surgery to include TVR/CHB and VSD

## 2011-09-25 NOTE — Assessment & Plan Note (Signed)
Maint NSR stop amiodarone and coumadin  Tissue valve has been in since 11/12

## 2011-09-25 NOTE — Assessment & Plan Note (Signed)
Well controlled.  Continue current medications and low sodium Dash type diet.    

## 2011-09-26 ENCOUNTER — Encounter (HOSPITAL_COMMUNITY)
Admission: RE | Admit: 2011-09-26 | Discharge: 2011-09-26 | Disposition: A | Discharge status: Home / Self Care | Payer: Medicare Other | Source: Ambulatory Visit | Attending: Cardiovascular Disease | Admitting: Cardiovascular Disease

## 2011-09-26 ENCOUNTER — Encounter: Payer: Self-pay | Admitting: Internal Medicine

## 2011-09-26 DIAGNOSIS — I442 Atrioventricular block, complete: Secondary | ICD-10-CM

## 2011-09-26 LAB — PACEMAKER DEVICE OBSERVATION
BAMS-0001: 175 {beats}/min
VENTRICULAR PACING PM: 100

## 2011-09-29 ENCOUNTER — Encounter (HOSPITAL_COMMUNITY): Payer: Medicare Other

## 2011-09-29 ENCOUNTER — Encounter (HOSPITAL_COMMUNITY)
Admission: RE | Admit: 2011-09-29 | Discharge: 2011-09-29 | Disposition: A | Discharge status: Home / Self Care | Payer: Medicare Other | Source: Ambulatory Visit | Attending: Cardiovascular Disease | Admitting: Cardiovascular Disease

## 2011-10-01 ENCOUNTER — Encounter (HOSPITAL_COMMUNITY)
Admission: RE | Admit: 2011-10-01 | Discharge: 2011-10-01 | Disposition: A | Discharge status: Home / Self Care | Payer: Medicare Other | Source: Ambulatory Visit | Attending: Cardiovascular Disease | Admitting: Cardiovascular Disease

## 2011-10-03 ENCOUNTER — Encounter (HOSPITAL_COMMUNITY): Payer: Medicare Other

## 2011-10-03 ENCOUNTER — Encounter (HOSPITAL_COMMUNITY)
Admission: RE | Admit: 2011-10-03 | Discharge: 2011-10-03 | Disposition: A | Discharge status: Home / Self Care | Payer: Medicare Other | Source: Ambulatory Visit | Attending: Cardiovascular Disease | Admitting: Cardiovascular Disease

## 2011-10-03 DIAGNOSIS — G4733 Obstructive sleep apnea (adult) (pediatric): Secondary | ICD-10-CM | POA: Insufficient documentation

## 2011-10-03 DIAGNOSIS — I079 Rheumatic tricuspid valve disease, unspecified: Secondary | ICD-10-CM | POA: Insufficient documentation

## 2011-10-03 DIAGNOSIS — Q210 Ventricular septal defect: Secondary | ICD-10-CM | POA: Insufficient documentation

## 2011-10-03 DIAGNOSIS — J449 Chronic obstructive pulmonary disease, unspecified: Secondary | ICD-10-CM | POA: Insufficient documentation

## 2011-10-03 DIAGNOSIS — Q254 Congenital malformation of aorta unspecified: Secondary | ICD-10-CM | POA: Insufficient documentation

## 2011-10-03 DIAGNOSIS — Z5189 Encounter for other specified aftercare: Secondary | ICD-10-CM | POA: Insufficient documentation

## 2011-10-03 DIAGNOSIS — F3289 Other specified depressive episodes: Secondary | ICD-10-CM | POA: Insufficient documentation

## 2011-10-03 DIAGNOSIS — I10 Essential (primary) hypertension: Secondary | ICD-10-CM | POA: Insufficient documentation

## 2011-10-03 DIAGNOSIS — I252 Old myocardial infarction: Secondary | ICD-10-CM | POA: Insufficient documentation

## 2011-10-03 DIAGNOSIS — J4489 Other specified chronic obstructive pulmonary disease: Secondary | ICD-10-CM | POA: Insufficient documentation

## 2011-10-03 DIAGNOSIS — I4891 Unspecified atrial fibrillation: Secondary | ICD-10-CM | POA: Insufficient documentation

## 2011-10-03 DIAGNOSIS — F329 Major depressive disorder, single episode, unspecified: Secondary | ICD-10-CM | POA: Insufficient documentation

## 2011-10-03 DIAGNOSIS — Z9889 Other specified postprocedural states: Secondary | ICD-10-CM | POA: Insufficient documentation

## 2011-10-03 DIAGNOSIS — I422 Other hypertrophic cardiomyopathy: Secondary | ICD-10-CM | POA: Insufficient documentation

## 2011-10-03 DIAGNOSIS — I442 Atrioventricular block, complete: Secondary | ICD-10-CM | POA: Insufficient documentation

## 2011-10-03 NOTE — Progress Notes (Signed)
Holly Simmons complained of increased shortness of breath with exertion today at exercise blood pressure stable.  Oxygen saturation 97-99% on room air.  Upon assessment fields clear.  Holly Simmons rides to [employer]  Twice a week and comes to class late.  I encouraged Holly Simmons to consider having her husband drive her to class on the days they drive out of town. I encouraged Holly Simmons to watch her salt intake.  Holly Simmons's weight was up slightly from Wednesday.  Holly Simmons left cardiac rehab without complaints of SOB. Will continue to monitor.

## 2011-10-06 ENCOUNTER — Encounter (HOSPITAL_COMMUNITY)
Admission: RE | Admit: 2011-10-06 | Discharge: 2011-10-06 | Disposition: A | Discharge status: Home / Self Care | Payer: Medicare Other | Source: Ambulatory Visit | Attending: Cardiovascular Disease | Admitting: Cardiovascular Disease

## 2011-10-07 ENCOUNTER — Telehealth: Payer: Self-pay

## 2011-10-07 NOTE — Telephone Encounter (Signed)
Patient stopped by filled out a walk in sheet stating that she is having difficulty swallowing her potassium pills, She also reports that they leave a bad taste in her mouth and makes her feel sick. She would like to know if there is anything else that she can take for potassium

## 2011-10-08 ENCOUNTER — Encounter (HOSPITAL_COMMUNITY): Payer: Medicare Other

## 2011-10-08 ENCOUNTER — Telehealth: Payer: Self-pay | Admitting: Cardiovascular Disease

## 2011-10-08 NOTE — Telephone Encounter (Signed)
She can stop potassium for now and we'll recheck her level at her next OV

## 2011-10-08 NOTE — Telephone Encounter (Signed)
Kristen from [facility] office called wanting to know does patient need antibiotic before dental cleaning and how long does patient have to wait after heart attack before she has any dental work.Fowarded to [employer] for advice.

## 2011-10-08 NOTE — Telephone Encounter (Signed)
Pt to have a cleaning, will she need any pre meds prior, and how long should she wait before cleaning? Please also fax over answer

## 2011-10-08 NOTE — Telephone Encounter (Signed)
She can have cleaning and needs SBE prophylaxis

## 2011-10-09 ENCOUNTER — Telehealth: Payer: Self-pay | Admitting: Cardiovascular Disease

## 2011-10-09 MED ORDER — AMOXICILLIN 500 MG PO CAPS: ORAL_CAPSULE | ORAL | Status: DC

## 2011-10-09 NOTE — Telephone Encounter (Signed)
This note faxed over to  Kristin./cy

## 2011-10-09 NOTE — Telephone Encounter (Signed)
New problem:  Per Cyril Mourning what antibiotic will patient need also the dosage of medication.  will this be a whole life time for dental treatment. Can a Rx  be called in for the first time.

## 2011-10-09 NOTE — Telephone Encounter (Signed)
Patient notified

## 2011-10-10 ENCOUNTER — Ambulatory Visit: Payer: Medicare Other | Admitting: *Deleted

## 2011-10-10 ENCOUNTER — Telehealth: Payer: Self-pay | Admitting: *Deleted

## 2011-10-10 ENCOUNTER — Other Ambulatory Visit: Payer: Self-pay | Admitting: Internal Medicine

## 2011-10-10 ENCOUNTER — Encounter (HOSPITAL_COMMUNITY)
Admission: RE | Admit: 2011-10-10 | Discharge: 2011-10-10 | Disposition: A | Discharge status: Home / Self Care | Payer: Medicare Other | Source: Ambulatory Visit | Attending: Cardiovascular Disease | Admitting: Cardiovascular Disease

## 2011-10-10 ENCOUNTER — Telehealth: Payer: Self-pay | Admitting: Physician Assistant

## 2011-10-10 DIAGNOSIS — R0600 Dyspnea, unspecified: Secondary | ICD-10-CM

## 2011-10-10 DIAGNOSIS — I421 Obstructive hypertrophic cardiomyopathy: Secondary | ICD-10-CM

## 2011-10-10 DIAGNOSIS — I519 Heart disease, unspecified: Secondary | ICD-10-CM

## 2011-10-10 LAB — CBC WITH DIFFERENTIAL/PLATELET
Basophils Absolute: 0 10*3/uL (ref 0.0–0.1)
Basophils Relative: 0 % (ref 0–1)
HCT: 34 % — ABNORMAL LOW (ref 36.0–46.0)
Lymphocytes Relative: 17 % (ref 12–46)
MCHC: 30.6 g/dL (ref 30.0–36.0)
Monocytes Absolute: 0.6 10*3/uL (ref 0.1–1.0)
Neutro Abs: 4.1 10*3/uL (ref 1.7–7.7)
Platelets: 167 10*3/uL (ref 150–400)
RDW: 14.7 % (ref 11.5–15.5)
WBC: 6 10*3/uL (ref 4.0–10.5)

## 2011-10-10 LAB — BRAIN NATRIURETIC PEPTIDE: Brain Natriuretic Peptide: 157.9 pg/mL — ABNORMAL HIGH (ref 0.0–100.0)

## 2011-10-10 LAB — BASIC METABOLIC PANEL
BUN: 20 mg/dL (ref 6–23)
Creat: 1.35 mg/dL — ABNORMAL HIGH (ref 0.50–1.10)
Potassium: 4.2 mEq/L (ref 3.5–5.3)

## 2011-10-10 NOTE — Telephone Encounter (Signed)
Dr. Harrington Challenger reviewed and recommended pt come in office today for BMP,BNP,CBC. Holly Simmons will have pt come in now for lab work. Horton Chin RN

## 2011-10-10 NOTE — Progress Notes (Signed)
Pt c/o dyspnea on exertion at cardiac rehab today. Pt reports DOE more than usual today, she awoke short of breath this am.  Denies orthopnea, pedal edema same as usual.  VSS.  122/60, HR-80, 02 sat-94-98% with exertion.  Pt weight up 1.3 kg with faint rales left.  Admits to eating out frequently.  pc to Dr. Kyla Balzarine office to inform of pt sx. Spoke to [place], per Dr. Harrington Challenger, pt advised to come to office for labwork today.  Pt informed of this and also advised to follow strict low  sodium diet over weekend with 1500-2030ml fluid daily, keep feet elevated when sitting.  Pt verbalized understanding.-jr,rn

## 2011-10-10 NOTE — Telephone Encounter (Signed)
Received call re: stat labs. They were drawn today per order of Dr. Harrington Challenger for pt-reported SOB. Patient was not seen in clinic. BNP only mildly elevated. Cr slightly elevated but in line with prior reading. Hgb is somewhat low at 10.4 compared to prior - 12.7. I attempted to call patient with no answer to report labs.  Discussed with Dr Haroldine Laws, will forward to PCP as well as Dr. Harrington Challenger (who ordered labs) for review. Will likely need further w/u for anemia.  Audley Hinojos PA-C

## 2011-10-10 NOTE — Telephone Encounter (Signed)
JoAnne, cardiac rehab nurse, calls today b/c pt weight is up 2.5 pounds. As well as pt c/o DOE more than usual over the last few days.  This was after pt had completed 30 minute exercise. Her current bp is 120/60  O2 sats with exercise are 94-98%, Faint rales in left lower base

## 2011-10-13 ENCOUNTER — Encounter (HOSPITAL_COMMUNITY): Payer: Medicare Other

## 2011-10-14 NOTE — Telephone Encounter (Signed)
KRISTIN AWARE  PRE MED CALLED IN FOR PT AND WILL NEED  ANTIBIOTIC  FOR ANY DENTAL PROCEDURE DUE TO HAVING VALVE REPLACED./CY

## 2011-10-15 ENCOUNTER — Encounter (HOSPITAL_COMMUNITY)
Admission: RE | Admit: 2011-10-15 | Discharge: 2011-10-15 | Payer: Medicare Other | Source: Ambulatory Visit | Attending: Cardiovascular Disease | Admitting: Cardiovascular Disease

## 2011-10-15 ENCOUNTER — Telehealth: Payer: Self-pay | Admitting: *Deleted

## 2011-10-15 NOTE — Telephone Encounter (Signed)
Pt walked into the office today requesting the results of her lab work. She was at rehab last week with increased SOB with exertion and dr Harrington Challenger had ordered some labs and the pt was told she would hear from our office Monday. She has not heard anything so she came into ask on her way to rehab. Copy of labs given to pt, she is c/o SOB with little exertion. She is not having trouble with SOB when lying flat. The pt is very concerned and I assured her I would get a message to dr Johnsie Cancel and have him review the labs. Pt agreed. Will forward for dr Johnsie Cancel review

## 2011-10-15 NOTE — Progress Notes (Signed)
Holly Simmons came to cardiac rehab continues to complain of shortness of breath with exertion and when getting up in the morning.  Sa02 99% on room air.  Holly Simmons has a copy of her lab results. Scattered crackles present posterior bases. Denies shortness of breath at present.  Talked with Holly Simmons about patients complaints.  Appointment given to Holly Simmons to follow up with Holly Simmons on Friday at 0910.  No exercise today.  Patient knows to call Dr Kyla Balzarine office if symptoms get worse.  Blood pressure 108/60 heart rate 77.

## 2011-10-15 NOTE — Telephone Encounter (Signed)
Received call from [facility] at cardiac rehab, states patient has been sob last 2 sessions.States wants patient to be evaluated for sob.Appointment scheduled with Richardson Dopp PA 10/17/11.

## 2011-10-17 ENCOUNTER — Ambulatory Visit (INDEPENDENT_AMBULATORY_CARE_PROVIDER_SITE_OTHER): Payer: Medicare Other | Admitting: Physician Assistant

## 2011-10-17 ENCOUNTER — Encounter: Payer: Self-pay | Admitting: Internal Medicine

## 2011-10-17 ENCOUNTER — Encounter (HOSPITAL_COMMUNITY)
Admission: RE | Admit: 2011-10-17 | Discharge: 2011-10-17 | Disposition: A | Discharge status: Home / Self Care | Payer: Medicare Other | Source: Ambulatory Visit | Attending: Cardiovascular Disease | Admitting: Cardiovascular Disease

## 2011-10-17 ENCOUNTER — Other Ambulatory Visit: Payer: Self-pay | Admitting: Internal Medicine

## 2011-10-17 ENCOUNTER — Ambulatory Visit (INDEPENDENT_AMBULATORY_CARE_PROVIDER_SITE_OTHER)
Admission: RE | Admit: 2011-10-17 | Discharge: 2011-10-17 | Disposition: A | Discharge status: Home / Self Care | Payer: Medicare Other | Source: Ambulatory Visit | Attending: Cardiovascular Disease | Admitting: Cardiovascular Disease

## 2011-10-17 ENCOUNTER — Encounter: Payer: Self-pay | Admitting: Physician Assistant

## 2011-10-17 VITALS — BP 118/60 | HR 80 | Ht 63.0 in | Wt 197.0 lb

## 2011-10-17 DIAGNOSIS — R0602 Shortness of breath: Secondary | ICD-10-CM

## 2011-10-17 DIAGNOSIS — I509 Heart failure, unspecified: Secondary | ICD-10-CM

## 2011-10-17 DIAGNOSIS — I442 Atrioventricular block, complete: Secondary | ICD-10-CM

## 2011-10-17 DIAGNOSIS — I422 Other hypertrophic cardiomyopathy: Secondary | ICD-10-CM

## 2011-10-17 DIAGNOSIS — I5032 Chronic diastolic (congestive) heart failure: Secondary | ICD-10-CM

## 2011-10-17 LAB — BASIC METABOLIC PANEL WITH GFR
BUN: 15 mg/dL (ref 6–23)
CO2: 31 meq/L (ref 19–32)
Calcium: 9.9 mg/dL (ref 8.4–10.5)
Chloride: 107 meq/L (ref 96–112)
Creatinine, Ser: 1.3 mg/dL — ABNORMAL HIGH (ref 0.4–1.2)
GFR: 44.85 mL/min — ABNORMAL LOW
Glucose, Bld: 138 mg/dL — ABNORMAL HIGH (ref 70–99)
Potassium: 5.1 meq/L (ref 3.5–5.1)
Sodium: 143 meq/L (ref 135–145)

## 2011-10-17 LAB — PACEMAKER DEVICE OBSERVATION
ATRIAL PACING PM: 100
BAMS-0001: 175 {beats}/min
VENTRICULAR PACING PM: 100

## 2011-10-17 LAB — BRAIN NATRIURETIC PEPTIDE: Pro B Natriuretic peptide (BNP): 289 pg/mL — ABNORMAL HIGH (ref 0.0–100.0)

## 2011-10-17 MED ORDER — POTASSIUM CHLORIDE ER 10 MEQ PO TBCR: 10.0000 meq | EXTENDED_RELEASE_TABLET | Freq: Every day | ORAL | Status: DC

## 2011-10-17 MED ORDER — FUROSEMIDE 40 MG PO TABS: 40.0000 mg | ORAL_TABLET | Freq: Every day | ORAL | Status: DC

## 2011-10-17 NOTE — Progress Notes (Signed)
[place],   [id number] Phone: [phone] Fax:  [phone]  Date:  10/17/2011   Name:  Holly Simmons       DOB:  [birth year] MRN:  [id number]  PCP:  Dr. Ronnald Ramp  Primary Cardiologist:  Dr. Jenkins Rouge  Primary Electrophysiologist:  Dr. Thompson Grayer    History of Present Illness: Holly Simmons is a 71 y.o. female who presents for dyspnea.  She has a fairly complex history.  She has a history of HOCM and underwent surgery at [facility] recently.  She was seen in followup by Dr. Rayann Heman 09/03/11 and Dr. Johnsie Cancel 09/25/11.  She underwent myomectomy at [facility] 06/23/11.  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  She underwent cardioversion for atrial flutter and was placed on Coumadin and amiodarone.  Epicardial leads are known to have a high threshold.    Echocardiogram 08/27/11: Status post septal myomectomy complicated by VSD now with VSD patch repair-patch covers the basal septal wall and leads to akinesis of the wall within that area, small to moderate residual VSD at the apical end of the patch, EF 50%, moderate diastolic dysfunction, no LVOT or SAM, trivial MR, TR with mildly elevated mean gradient.    When last seen by Dr. Johnsie Cancel, she was maintaining sinus rhythm and her amiodarone and Coumadin were both discontinued.  Plans are to follow up with Dr. Rayann Heman as she may need coronary sinus pacing.  Both Drs. Nishan and Allred have been in close contact with her physicians at [facility] regarding her postoperative complications.  Cardiac rehabilitation has noted increased dyspnea and put her on my schedule today.  She was initially set up for a basic metabolic panel, BMP and CBC.  BNP 157.9, Hgb 10.4, MCV 90.4, K 4.2, creatinine 1.35.     Over the last couple weeks, she notes dyspnea  with exertion.  She also notes orthopnea as well as probable PND.  She notes lower extremity edema.  Her left leg usually is larger than her right.  She notes Class IIb-III DOE.  She notes increased heart rate with activity.  Her chest incision is still sore.  She denies fevers, chills, cough.  She denies vomiting or diarrhea.  She denies melena or hematochezia.  Past Medical History  Diagnosis Date  . Hypertension   . Hyperpotassemia   . Asthma     NOS w/ acute exacerbation  . Hypersomnia   . Depressive disorder   . Diastolic dysfunction   . Pyuria   . Renal insufficiency   . GERD (gastroesophageal reflux disease)   . COPD (chronic obstructive pulmonary disease)   . Obstructive sleep apnea     persistent daytime sleepiness despite cpap  . Allergic rhinitis   . Renal insufficiency   . Psoriasis   . Hypertrophic cardiomyopathy     s/p surgical myomectomy at [facility] 0000000 complicated by septal VSD post procedure requiring reoperation with patch closure and tricuspid valve replacement  . 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 Complete heart block     requiring PPM (MDT) post surgical myomectomy at [facility],  leads are epicardial with abdominal implant, high ventricular threshold at implant    Current Outpatient Prescriptions  Medication Sig Dispense Refill  . amoxicillin (AMOXIL) 500 MG capsule Take 4  tabs  1  Hour prior to dental visit./cy  4 capsule  2  . Armodafinil (NUVIGIL) 150 MG tablet Take 1 tablet (150 mg total) by mouth daily.  30 tablet  5  . aspirin 81 MG tablet Take 81 mg by mouth daily.        . budesonide-formoterol (SYMBICORT) 160-4.5 MCG/ACT inhaler Inhale 2 puffs into the lungs 2 (two) times daily.        . calcipotriene (DOVONEX) 0.005 % cream Apply topically 2 (two) times daily.  60 g  0  . Cholecalciferol (VITAMIN D) 2000 UNITS tablet Take 2,000 Units by mouth daily.        Marland Kitchen docusate sodium (COLACE) 100 MG capsule Take 100 mg by mouth daily as needed.        . fexofenadine (ALLEGRA) 180 MG tablet Take 180 mg by mouth daily. prn       . ketoconazole (NIZORAL) 2 % cream Apply topically daily.  15 g  0  . L-Methylfolate-B6-B12 (METANX) 3-35-2 MG TABS Take 1 tablet by mouth 2 (two) times daily.        . LUXIQ 0.12 % foam APPLY TO SCALP AS DIRECTED  100 g  9  . metoprolol (TOPROL-XL) 100 MG 24 hr tablet Take 1 tablet (100 mg total) by mouth daily.  30 tablet  11  . ranitidine (ZANTAC) 300 MG tablet Take 1 tablet (300 mg total) by mouth at bedtime.  90 tablet  3  . spironolactone (ALDACTONE) 12.5 mg TABS Take 0.5 tablets (12.5 mg total) by mouth daily.  15 tablet  11  . Vilazodone HCl (VIIBRYD) 40 MG TABS Take 1 tablet by mouth daily.  140 tablet  0    Allergies: Allergies  Allergen Reactions  . Tetanus Toxoids Swelling  . Nsaids     REACTION: proteinuria  . Sulfonamide Derivatives     History  Substance Use Topics  . Smoking status: Never Smoker   . Smokeless tobacco: Not on file  . Alcohol Use: No     ROS:  Please see the history of present illness.    All other systems reviewed and negative.   PHYSICAL EXAM: VS:  BP 118/60  Ht 5\' 3"  (1.6 m)  Wt 197 lb (89.359 kg)  BMI 34.90 kg/m2 Well nourished, well developed, in no acute distress HEENT: normal Neck: + JVD Cardiac:  normal S1, S2; RRR; 2/6 systolic murmur at LLSB Lungs:  Crackles in the bases bilaterally, no wheezing, rhonchi  Abd: soft, nontender, no hepatomegaly Ext: trace-1+ bilateral edema Skin: warm and dry Neuro:  CNs 2-12 intact, no focal abnormalities noted  EKG:  V paced, HR 80  ASSESSMENT AND PLAN:  1. SOB (shortness of breath)  She appears volume overloaded.  Hgb was low recently but she had several transfusions post op at [facility].  BNP only mildly elevated and creatinine mildly up.  We had her pacer interrogated.  Case d/w Dr. Thompson Grayer over the phone and her base rate was decreased to 70 and rate response, which was not on, was turned on.  I will put her on  Lasix 40 mg QD and K+ 10 mEq QD.  Check BMET and BNP today.  Check a CXR.  Get repeat BMET in one week.  Follow up with me in one week.  Dr. Jenkins Rouge also saw her and has cleared her to go back to cardiac rehab.   2. Chronic diastolic heart failure  As noted above.   3.  Hypertrophic cardiomyopathy  S/p complicated course after myomectomy.  Follow up with Dr. Jenkins Rouge as scheduled.   4. Complete heart block  Pacer changed as noted.  Keep follow up with Dr. Thompson Grayer in April.      Danton Sewer, PA-C  9:23 AM 10/17/2011

## 2011-10-17 NOTE — Patient Instructions (Signed)
Your physician recommends that you schedule a follow-up appointment in: [place], PA-C  Your physician recommends that you schedule a follow-up appointment in: [place] DR. Johnsie Cancel PER DR. Johnsie Cancel  Your physician recommends that you return for lab work in: [place], BNP 786.05  Your physician recommends that you return for lab work in: [place], PA-C  A chest x-ray DX 786.05 SOB, takes a picture of the organs and structures inside the chest, including the heart, lungs, and blood vessels. This test can show several things, including, whether the heart is enlarges; whether fluid is building up in the lungs; and whether pacemaker / defibrillator leads are still in place.   KEEP YOUR APPOINTMENT TO SEE DR. ALLRED 11/13/11  Your physician has recommended you make the following change in your medication: START LASIX 40 MG DAILY AND RESTART POTASSIUM 10 MEQ DAILY

## 2011-10-20 ENCOUNTER — Encounter (HOSPITAL_COMMUNITY)
Admission: RE | Admit: 2011-10-20 | Discharge: 2011-10-20 | Disposition: A | Discharge status: Home / Self Care | Payer: Medicare Other | Source: Ambulatory Visit | Attending: Cardiovascular Disease | Admitting: Cardiovascular Disease

## 2011-10-20 NOTE — Progress Notes (Signed)
Holly Simmons 71 y.o. female Nutrition Note Spoke with pt.  Nutrition Plan and Nutrition Survey reviewed with pt. According to pt nutrition survey, pt is following Step 2 of the Therapeutic Lifestyle Changes diet. According to pt 24 hour food record, pt consumed 26 grams of saturated fat or 11.7% of estimated kcal and 171 grams of sugar. The quality of nutrients lacking in pt diet discussed. Per discussion with pt, the desire to lose wt is more of a "wish" than a goal. Pt currently not planning on making changes to her diet to promote weight loss. Pt is now off of coumadin. Pt expressed understanding of information presented. Nutrition Diagnosis   Food-and nutrition-related knowledge deficit related to lack of exposure to information as related to diagnosis of: ? CVD   Obesity related to excessive energy intake as evidenced by a BMI of 33.2 Nutrition RX/ Re-Estimated Daily Nutrition Needs for: wt maintenance 2000-2200 Kcal, 65-70 gm fat, 15-17 gm sat fat, 2.2-2.4 gm trans-fat, <1500 mg sodium  Nutrition Intervention   Pt's individual nutrition plan including cholesterol goals reviewed with pt.   Benefits of adopting Therapeutic Lifestyle Changes discussed when Medficts reviewed.   Pt to attend the Portion Distortion class   Pt to attend the  ? Nutrition I class                     ? Nutrition II class    Pt given handouts for: ? wt loss    Continue client-centered nutrition education by RD, as part of interdisciplinary care. Goal(s)   Pt to identify and limit food sources of saturated fat, trans fat, and cholesterol   Pt to describe the benefit of including fruits, vegetables, whole grains, and low-fat dairy products in a heart healthy meal plan. Monitor and Evaluate progress toward nutrition goal with team.

## 2011-10-22 ENCOUNTER — Encounter (HOSPITAL_COMMUNITY)
Admission: RE | Admit: 2011-10-22 | Discharge: 2011-10-22 | Disposition: A | Discharge status: Home / Self Care | Payer: Medicare Other | Source: Ambulatory Visit | Attending: Cardiovascular Disease | Admitting: Cardiovascular Disease

## 2011-10-22 NOTE — Progress Notes (Signed)
Holly Simmons feels better since she has been taking her diuretic. Sao2 97% on room air.  Rhondi says her potassium has been discontinued.

## 2011-10-23 ENCOUNTER — Other Ambulatory Visit (INDEPENDENT_AMBULATORY_CARE_PROVIDER_SITE_OTHER): Payer: Medicare Other

## 2011-10-23 ENCOUNTER — Ambulatory Visit (INDEPENDENT_AMBULATORY_CARE_PROVIDER_SITE_OTHER): Payer: Medicare Other | Admitting: Physician Assistant

## 2011-10-23 ENCOUNTER — Encounter: Payer: Self-pay | Admitting: Physician Assistant

## 2011-10-23 VITALS — BP 117/64 | HR 107 | Ht 63.0 in | Wt 192.0 lb

## 2011-10-23 DIAGNOSIS — I442 Atrioventricular block, complete: Secondary | ICD-10-CM

## 2011-10-23 DIAGNOSIS — I5032 Chronic diastolic (congestive) heart failure: Secondary | ICD-10-CM

## 2011-10-23 DIAGNOSIS — J310 Chronic rhinitis: Secondary | ICD-10-CM

## 2011-10-23 DIAGNOSIS — Z79899 Other long term (current) drug therapy: Secondary | ICD-10-CM

## 2011-10-23 DIAGNOSIS — I509 Heart failure, unspecified: Secondary | ICD-10-CM

## 2011-10-23 DIAGNOSIS — I421 Obstructive hypertrophic cardiomyopathy: Secondary | ICD-10-CM

## 2011-10-23 DIAGNOSIS — I4891 Unspecified atrial fibrillation: Secondary | ICD-10-CM

## 2011-10-23 DIAGNOSIS — I422 Other hypertrophic cardiomyopathy: Secondary | ICD-10-CM

## 2011-10-23 DIAGNOSIS — R0602 Shortness of breath: Secondary | ICD-10-CM

## 2011-10-23 LAB — BASIC METABOLIC PANEL
CO2: 31 mEq/L (ref 19–32)
Calcium: 9.8 mg/dL (ref 8.4–10.5)
Creatinine, Ser: 1.6 mg/dL — ABNORMAL HIGH (ref 0.4–1.2)
GFR: 35.05 mL/min — ABNORMAL LOW (ref >60.00)
Glucose, Bld: 137 mg/dL — ABNORMAL HIGH (ref 70–99)

## 2011-10-23 MED ORDER — FLUTICASONE PROPIONATE 50 MCG/ACT NA SUSP: 2.0000 | Freq: Every day | NASAL | Status: DC

## 2011-10-23 NOTE — Patient Instructions (Signed)
Your physician recommends that you have a BMET today  Your physician recommends that you keep follow-up appt with Dr. Rayann Heman  Your physician recommends that you continue on your current medications as directed. Please refer to the Current Medication list given to you today.

## 2011-10-23 NOTE — Progress Notes (Signed)
[place], [place]  [address] Phone: [phone] Fax:  [phone]  Date:  10/23/2011   Name:  Holly Simmons       DOB:  May 15, 1941 MRN:  [id number]  PCP:  Dr. Ronnald Ramp  Primary Cardiologist:  Dr. Jenkins Rouge  Primary Electrophysiologist:  Dr. Thompson Grayer    History of Present Illness: Holly Simmons is a 71 y.o. female who presents for follow up.  She has a fairly complex history.  She has a history of HOCM and underwent surgery at [facility] recently.  She was seen in followup by Dr. Rayann Heman 09/03/11 and Dr. Johnsie Cancel 09/25/11.  She underwent myomectomy at [facility] 06/23/11.  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  She underwent cardioversion for atrial flutter and was placed on Coumadin and amiodarone.  Epicardial leads are known to have a high threshold.    Echocardiogram 08/27/11: Status post septal myomectomy complicated by VSD now with VSD patch repair-patch covers the basal septal wall and leads to akinesis of the wall within that area, small to moderate residual VSD at the apical end of the patch, EF 50%, moderate diastolic dysfunction, no LVOT or SAM, trivial MR, TR with mildly elevated mean gradient.    When last seen by Dr. Johnsie Cancel, she was maintaining sinus rhythm and her amiodarone and Coumadin were both discontinued.  Plans are to follow up with Dr. Rayann Heman as she may need coronary sinus pacing.  Both Drs. Nishan and Allred have been in close contact with her physicians at [facility] regarding her postoperative complications.  I saw her last week with complaints of increased dyspnea with exertion.  Her ventricular rate was set at 80.  This was turned down to 70 and her rate response was turned on.  She seemed volume overloaded and we added Lasix.  I also gave her some K+.  She  was called yesterday and told to stop the K+.  She is feeling much better.  Going to cardiac rehab.  Denies significant DOE.  Chest still sore.  No orthopnea, PND or edema.  Weight down 7 pounds.    Past Medical History  Diagnosis Date  . Hypertension   . Hyperpotassemia   . Asthma     NOS w/ acute exacerbation  . Hypersomnia   . Depressive disorder   . Diastolic dysfunction   . Pyuria   . Renal insufficiency   . GERD (gastroesophageal reflux disease)   . COPD (chronic obstructive pulmonary disease)   . Obstructive sleep apnea     persistent daytime sleepiness despite cpap  . Allergic rhinitis   . Renal insufficiency   . Psoriasis   . Hypertrophic cardiomyopathy     s/p surgical myomectomy at [facility] 0000000 complicated by septal VSD post procedure requiring reoperation with patch closure and tricuspid valve replacement  . 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 Complete heart block     requiring PPM (MDT) post surgical myomectomy at [facility],  leads are epicardial with abdominal implant, high ventricular threshold at implant    Current Outpatient Prescriptions  Medication Sig Dispense Refill  . amoxicillin (AMOXIL) 500 MG capsule Take 4 tabs  1  Hour prior to dental visit./cy  4 capsule  2  . Armodafinil (NUVIGIL) 150 MG tablet Take 1  tablet (150 mg total) by mouth daily.  30 tablet  5  . aspirin 81 MG tablet Take 81 mg by mouth daily.        . budesonide-formoterol (SYMBICORT) 160-4.5 MCG/ACT inhaler Inhale 2 puffs into the lungs 2 (two) times daily.        . calcipotriene (DOVONEX) 0.005 % cream Apply topically 2 (two) times daily.  60 g  0  . Cholecalciferol (VITAMIN D) 2000 UNITS tablet Take 2,000 Units by mouth daily.        Marland Kitchen docusate sodium (COLACE) 100 MG capsule Take 100 mg by mouth daily as needed.       . fexofenadine (ALLEGRA) 180 MG tablet Take 180 mg by mouth daily. prn       . furosemide (LASIX) 40 MG tablet Take 1 tablet (40 mg total) by mouth daily.  30  tablet  11  . ketoconazole (NIZORAL) 2 % cream Apply topically daily.  15 g  0  . L-Methylfolate-B6-B12 (METANX) 3-35-2 MG TABS Take 1 tablet by mouth 2 (two) times daily.        . LUXIQ 0.12 % foam APPLY TO SCALP AS DIRECTED  100 g  9  . metoprolol (TOPROL-XL) 100 MG 24 hr tablet Take 1 tablet (100 mg total) by mouth daily.  30 tablet  11  . potassium chloride (KLOR-CON 10) 10 MEQ tablet Take 1 tablet (10 mEq total) by mouth daily.  30 tablet  11  . ranitidine (ZANTAC) 300 MG tablet Take 1 tablet (300 mg total) by mouth at bedtime.  90 tablet  3  . spironolactone (ALDACTONE) 12.5 mg TABS Take 0.5 tablets (12.5 mg total) by mouth daily.  15 tablet  11  . Vilazodone HCl (VIIBRYD) 40 MG TABS Take 1 tablet by mouth daily.  140 tablet  0    Allergies: Allergies  Allergen Reactions  . Tetanus Toxoids Swelling  . Nsaids     REACTION: proteinuria  . Sulfonamide Derivatives     History  Substance Use Topics  . Smoking status: Never Smoker   . Smokeless tobacco: Never Used  . Alcohol Use: No     ROS:  Please see the history of present illness.  She notes significant rhinorrhea since d/c from the hospital.  No relief with antihistamines.   All other systems reviewed and negative.   PHYSICAL EXAM: VS:  BP 117/64  Pulse 107  Ht 5\' 3"  (1.6 m)  Wt 192 lb (87.091 kg)  BMI 34.01 kg/m2 Well nourished, well developed, in no acute distress HEENT: normal Neck: no JVD Cardiac:  normal S1, S2; RRR; 2/6 systolic murmur at LLSB Lungs:  Clear to ausc bilaterally, no wheezing, rhonchi  Abd: soft, nontender, no hepatomegaly Ext: trace bilateral edema Skin: warm and dry Neuro:  CNs 2-12 intact, no focal abnormalities noted  EKG:  V paced, HR 75  Lab Results  Component Value Date   CREATININE 1.3* 10/17/2011   BUN 15 10/17/2011   NA 143 10/17/2011   K 5.1 10/17/2011   CL 107 10/17/2011   CO2 31 10/17/2011     Pro B Natriuretic peptide (BNP)  Date/Time Value Range Status  10/17/2011 10:24 AM  289.0* 0.0-100.0 (pg/mL) Final    Dg Chest 2 View  10/17/2011   IMPRESSION: Slight enlargement of the cardiac silhouette.  No pulmonary edema, pneumonia, or pleural effusion.  Stable chronic findings are detailed above.  Original Report Authenticated By: Delane Ginger, M.D.     ASSESSMENT AND  PLAN:  1. Chronic diastolic heart failure  Volume improved.  She feels much better.  Weight is down.  Check BMET today.  She took K+ up until today.  Will likely need a repeat BMET in the next 7 days depending on results today.  Follow up as planned or sooner if increasing dyspnea or weight.   2. CARDIOMYOPATHY, HYPERTROPHIC  S/p myomectomy as outlined.  Follow up with Dr. Jenkins Rouge as planned.   3. Complete heart block  Probably feels better with decreased base HR and rate response on.  See Dr. Thompson Grayer next month as planned.   4. Rhinitis  Trial of Flonase prescribed.       Signed, Richardson Dopp, PA-C  10:02 AM 10/23/2011

## 2011-10-24 ENCOUNTER — Encounter (HOSPITAL_COMMUNITY)
Admission: RE | Admit: 2011-10-24 | Discharge: 2011-10-24 | Disposition: A | Discharge status: Home / Self Care | Payer: Medicare Other | Source: Ambulatory Visit | Attending: Cardiovascular Disease | Admitting: Cardiovascular Disease

## 2011-10-27 ENCOUNTER — Encounter (HOSPITAL_COMMUNITY)
Admission: RE | Admit: 2011-10-27 | Discharge: 2011-10-27 | Disposition: A | Discharge status: Home / Self Care | Payer: Medicare Other | Source: Ambulatory Visit | Attending: Cardiovascular Disease | Admitting: Cardiovascular Disease

## 2011-10-29 ENCOUNTER — Encounter (HOSPITAL_COMMUNITY)
Admission: RE | Admit: 2011-10-29 | Discharge: 2011-10-29 | Disposition: A | Discharge status: Home / Self Care | Payer: Medicare Other | Source: Ambulatory Visit | Attending: Cardiovascular Disease | Admitting: Cardiovascular Disease

## 2011-10-31 ENCOUNTER — Encounter (HOSPITAL_COMMUNITY)
Admission: RE | Admit: 2011-10-31 | Discharge: 2011-10-31 | Disposition: A | Discharge status: Home / Self Care | Payer: Medicare Other | Source: Ambulatory Visit | Attending: Cardiovascular Disease | Admitting: Cardiovascular Disease

## 2011-10-31 NOTE — Progress Notes (Addendum)
Holly Simmons complains of having a cold and a cough since Tuesday.  SAO2 97% on room air voice hoarse.  Blood pressure 112/60.  Patient advised not to exercise today.  Patient says she had a sore  Throat on Tuesday.  Denies having a sore throat today. Temperature 97.7.  Patient advised not to exercise today. Instructed patient to call her primary care physician if her symptoms get worse at home. Weight 86.7.  Lung fields clear upon auscultation. Holly Simmons denies any complaints or dizziness at time of discharge.

## 2011-11-01 ENCOUNTER — Encounter: Payer: Self-pay | Admitting: Family Medicine

## 2011-11-01 ENCOUNTER — Ambulatory Visit (INDEPENDENT_AMBULATORY_CARE_PROVIDER_SITE_OTHER): Payer: Medicare Other | Admitting: Family Medicine

## 2011-11-01 VITALS — BP 130/62 | Temp 98.0°F | Wt 190.0 lb

## 2011-11-01 DIAGNOSIS — J209 Acute bronchitis, unspecified: Secondary | ICD-10-CM

## 2011-11-01 MED ORDER — HYDROCODONE-HOMATROPINE 5-1.5 MG/5ML PO SYRP: 5.0000 mL | ORAL_SOLUTION | Freq: Four times a day (QID) | ORAL | Status: AC | PRN

## 2011-11-01 NOTE — Progress Notes (Signed)
  Subjective:    Patient ID: Holly Simmons, female    DOB: August 30, 1940, 71 y.o.   MRN: [id number]  HPI  Acute visit-Saturday morning clinic. Patient seen with cough. Started with sore throat about 4 days ago. Sore throat slowly improving. Now has mostly nonproductive coughing especially bothersome at night. Nonsmoker. No history of asthma. She denies any fever. No nasal congestion.   Review of Systems  Constitutional: Negative for fever and chills.  HENT: Positive for sore throat. Negative for sinus pressure.   Respiratory: Positive for cough. Negative for shortness of breath and wheezing.        Objective:   Physical Exam  Constitutional: She appears well-developed and well-nourished.  HENT:  Right Ear: External ear normal.  Left Ear: External ear normal.  Mouth/Throat: Oropharynx is clear and moist.  Neck: Neck supple. No thyromegaly present.  Cardiovascular: Normal rate and regular rhythm.   Pulmonary/Chest: Effort normal and breath sounds normal. No respiratory distress. She has no wheezes. She has no rales.  Musculoskeletal: She exhibits no edema.  Lymphadenopathy:    She has no cervical adenopathy.          Assessment & Plan:  Acute viral bronchitis.  Hycodan cough syrup for symptomatic relief. Follow promptly for fever or worsening symptoms

## 2011-11-01 NOTE — Patient Instructions (Signed)

## 2011-11-03 ENCOUNTER — Encounter (HOSPITAL_COMMUNITY)
Admission: RE | Admit: 2011-11-03 | Discharge: 2011-11-03 | Disposition: A | Discharge status: Home / Self Care | Payer: Medicare Other | Source: Ambulatory Visit | Attending: Cardiovascular Disease | Admitting: Cardiovascular Disease

## 2011-11-03 DIAGNOSIS — I422 Other hypertrophic cardiomyopathy: Secondary | ICD-10-CM | POA: Insufficient documentation

## 2011-11-03 DIAGNOSIS — Q254 Congenital malformation of aorta unspecified: Secondary | ICD-10-CM | POA: Insufficient documentation

## 2011-11-03 DIAGNOSIS — I442 Atrioventricular block, complete: Secondary | ICD-10-CM | POA: Insufficient documentation

## 2011-11-03 DIAGNOSIS — I079 Rheumatic tricuspid valve disease, unspecified: Secondary | ICD-10-CM | POA: Insufficient documentation

## 2011-11-03 DIAGNOSIS — F329 Major depressive disorder, single episode, unspecified: Secondary | ICD-10-CM | POA: Insufficient documentation

## 2011-11-03 DIAGNOSIS — Z5189 Encounter for other specified aftercare: Secondary | ICD-10-CM | POA: Insufficient documentation

## 2011-11-03 DIAGNOSIS — F3289 Other specified depressive episodes: Secondary | ICD-10-CM | POA: Insufficient documentation

## 2011-11-03 DIAGNOSIS — G4733 Obstructive sleep apnea (adult) (pediatric): Secondary | ICD-10-CM | POA: Insufficient documentation

## 2011-11-03 DIAGNOSIS — I10 Essential (primary) hypertension: Secondary | ICD-10-CM | POA: Insufficient documentation

## 2011-11-03 DIAGNOSIS — I252 Old myocardial infarction: Secondary | ICD-10-CM | POA: Insufficient documentation

## 2011-11-03 DIAGNOSIS — Z9889 Other specified postprocedural states: Secondary | ICD-10-CM | POA: Insufficient documentation

## 2011-11-03 DIAGNOSIS — J449 Chronic obstructive pulmonary disease, unspecified: Secondary | ICD-10-CM | POA: Insufficient documentation

## 2011-11-03 DIAGNOSIS — Q210 Ventricular septal defect: Secondary | ICD-10-CM | POA: Insufficient documentation

## 2011-11-03 DIAGNOSIS — I4891 Unspecified atrial fibrillation: Secondary | ICD-10-CM | POA: Insufficient documentation

## 2011-11-03 DIAGNOSIS — J4489 Other specified chronic obstructive pulmonary disease: Secondary | ICD-10-CM | POA: Insufficient documentation

## 2011-11-03 NOTE — Progress Notes (Signed)
Holly Simmons returned to exercise today.  Rachael says her cough is better.  Will continue to monitor.

## 2011-11-05 ENCOUNTER — Encounter (HOSPITAL_COMMUNITY)
Admission: RE | Admit: 2011-11-05 | Discharge: 2011-11-05 | Disposition: A | Discharge status: Home / Self Care | Payer: Medicare Other | Source: Ambulatory Visit | Attending: Cardiovascular Disease | Admitting: Cardiovascular Disease

## 2011-11-07 ENCOUNTER — Encounter (HOSPITAL_COMMUNITY)
Admission: RE | Admit: 2011-11-07 | Discharge: 2011-11-07 | Disposition: A | Discharge status: Home / Self Care | Payer: Medicare Other | Source: Ambulatory Visit | Attending: Cardiovascular Disease | Admitting: Cardiovascular Disease

## 2011-11-10 ENCOUNTER — Encounter (HOSPITAL_COMMUNITY)
Admission: RE | Admit: 2011-11-10 | Discharge: 2011-11-10 | Disposition: A | Discharge status: Home / Self Care | Payer: Medicare Other | Source: Ambulatory Visit | Attending: Cardiovascular Disease | Admitting: Cardiovascular Disease

## 2011-11-12 ENCOUNTER — Encounter (HOSPITAL_COMMUNITY)
Admission: RE | Admit: 2011-11-12 | Discharge: 2011-11-12 | Disposition: A | Discharge status: Home / Self Care | Payer: Medicare Other | Source: Ambulatory Visit | Attending: Cardiovascular Disease | Admitting: Cardiovascular Disease

## 2011-11-12 NOTE — Progress Notes (Signed)
Holly Simmons has a follow up appointment with Dr Rayann Heman. Exercise flow sheets sent via Environmental health practitioner for review

## 2011-11-13 ENCOUNTER — Ambulatory Visit (INDEPENDENT_AMBULATORY_CARE_PROVIDER_SITE_OTHER): Payer: Medicare Other | Admitting: Internal Medicine

## 2011-11-13 ENCOUNTER — Other Ambulatory Visit: Payer: Self-pay

## 2011-11-13 ENCOUNTER — Encounter: Payer: Self-pay | Admitting: Internal Medicine

## 2011-11-13 ENCOUNTER — Encounter: Payer: Medicare Other | Admitting: Internal Medicine

## 2011-11-13 ENCOUNTER — Ambulatory Visit (INDEPENDENT_AMBULATORY_CARE_PROVIDER_SITE_OTHER): Payer: Medicare Other | Admitting: *Deleted

## 2011-11-13 VITALS — BP 110/60 | HR 91 | Resp 18 | Ht 63.0 in | Wt 191.1 lb

## 2011-11-13 DIAGNOSIS — N259 Disorder resulting from impaired renal tubular function, unspecified: Secondary | ICD-10-CM

## 2011-11-13 DIAGNOSIS — I421 Obstructive hypertrophic cardiomyopathy: Secondary | ICD-10-CM

## 2011-11-13 DIAGNOSIS — I4891 Unspecified atrial fibrillation: Secondary | ICD-10-CM

## 2011-11-13 DIAGNOSIS — I10 Essential (primary) hypertension: Secondary | ICD-10-CM

## 2011-11-13 DIAGNOSIS — R0602 Shortness of breath: Secondary | ICD-10-CM

## 2011-11-13 DIAGNOSIS — I5032 Chronic diastolic (congestive) heart failure: Secondary | ICD-10-CM

## 2011-11-13 DIAGNOSIS — I509 Heart failure, unspecified: Secondary | ICD-10-CM

## 2011-11-13 DIAGNOSIS — I442 Atrioventricular block, complete: Secondary | ICD-10-CM

## 2011-11-13 LAB — BASIC METABOLIC PANEL
BUN: 22 mg/dL (ref 6–23)
CO2: 30 mEq/L (ref 19–32)
Calcium: 9.7 mg/dL (ref 8.4–10.5)
Glucose, Bld: 158 mg/dL — ABNORMAL HIGH (ref 70–99)
Potassium: 3.3 mEq/L — ABNORMAL LOW (ref 3.5–5.1)
Sodium: 139 mEq/L (ref 135–145)

## 2011-11-13 LAB — PACEMAKER DEVICE OBSERVATION
AL IMPEDENCE PM: 827 Ohm
AL THRESHOLD: 0.5 V
BAMS-0001: 175 {beats}/min
BATTERY VOLTAGE: 2.78 V
RV LEAD THRESHOLD: 2.75 V
VENTRICULAR PACING PM: 100

## 2011-11-13 MED ORDER — FUROSEMIDE 40 MG PO TABS: ORAL_TABLET | ORAL | Status: DC

## 2011-11-13 NOTE — Assessment & Plan Note (Signed)
Stable No change required today  bmet today 

## 2011-11-13 NOTE — Assessment & Plan Note (Signed)
Decrease lasix to qod and follow bmet today  Follow-up with Dr Johnsie Cancel as scheduled

## 2011-11-13 NOTE — Patient Instructions (Signed)
Your physician wants you to follow-up in: Nov with Dr Rayann Heman Dennis Bast will receive a reminder letter in the mail two months in advance. If you don't receive a letter, please call our office to schedule the follow-up appointment.   Check daily weights and record  Your physician has recommended you make the following change in your medication:  1) Decrease Furosemide to 40mg  every other day

## 2011-11-13 NOTE — Progress Notes (Signed)
PCP:  Scarlette Calico, MD, MD Primary Cardiologist:  Dr Johnsie Cancel  The patient presents today for routine electrophysiology followup.  Since last being seen in our clinic, the patient reports doing very well. She continues to recover from her surgery for valvular heart disease at [facility].  She recently was seen by Richardson Dopp for SOB and edema.  This has resolved with lasix.  Today, she denies symptoms of palpitations, chest pain, shortness of breath, orthopnea, PND, lower extremity edema, dizziness, presyncope, or syncope.  The patient feels that she is tolerating medications without difficulties and is otherwise without complaint today.   Past Medical History  Diagnosis Date  . Hypertension   . Hyperpotassemia   . Asthma     NOS w/ acute exacerbation  . Hypersomnia   . Depressive disorder   . Diastolic dysfunction   . Pyuria   . Renal insufficiency   . GERD (gastroesophageal reflux disease)   . COPD (chronic obstructive pulmonary disease)   . Obstructive sleep apnea     persistent daytime sleepiness despite cpap  . Allergic rhinitis   . Renal insufficiency   . Psoriasis   . Hypertrophic cardiomyopathy     s/p surgical myomectomy at [facility] 0000000 complicated by septal VSD post procedure requiring reoperation with patch closure and tricuspid valve replacement  . 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 Complete heart block     requiring PPM (MDT) post surgical myomectomy at [facility],  leads are epicardial with abdominal implant, high ventricular threshold at implant   Past Surgical History  Procedure Date  . Appendectomy   . Cholecystectomy   . Tonsillectomy   . Cesarean section   . Abdominal hysterectomy   . Septal myomectomy for hypertrophic cm 06/23/11    by Dr Evelina Dun at [facility], complicated by septal VSD requiring patch repair and tricuspid valve replacement  . Tricuspid valve replacement 11/12    Medtronic 62mm Mosaic tissue valve  . Vsd repair 06/2011  . Pacemaker insertion  07/02/11    epicardial wires with abdominal implant at [place] 12/12,  high ventricular lead threshold at implant per Dr Westley Gambles    Current Outpatient Prescriptions  Medication Sig Dispense Refill  . Armodafinil (NUVIGIL) 150 MG tablet Take 1 tablet (150 mg total) by mouth daily.  30 tablet  5  . aspirin 81 MG tablet Take 81 mg by mouth daily.        . budesonide-formoterol (SYMBICORT) 160-4.5 MCG/ACT inhaler Inhale 2 puffs into the lungs 2 (two) times daily.        . calcipotriene (DOVONEX) 0.005 % cream Apply topically 2 (two) times daily.  60 g  0  . Cholecalciferol (VITAMIN D) 2000 UNITS tablet Take 2,000 Units by mouth daily.        Marland Kitchen docusate sodium (COLACE) 100 MG capsule Take 100 mg by mouth daily as needed.       . fexofenadine (ALLEGRA) 180 MG tablet Take 180 mg by mouth daily. prn       . furosemide (LASIX) 40 MG tablet Take 1 tablet (40 mg total) by mouth daily.  30 tablet  11  . ketoconazole (NIZORAL) 2 % cream Apply topically daily.  15 g  0  . L-Methylfolate-B6-B12 (METANX) 3-35-2 MG TABS Take 1 tablet by mouth 2 (two) times daily.        . LUXIQ 0.12 % foam APPLY TO SCALP AS DIRECTED  100 g  9  . metoprolol (TOPROL-XL) 100  MG 24 hr tablet Take 1 tablet (100 mg total) by mouth daily.  30 tablet  11  . ranitidine (ZANTAC) 300 MG tablet Take 1 tablet (300 mg total) by mouth at bedtime.  90 tablet  3  . spironolactone (ALDACTONE) 12.5 mg TABS Take 0.5 tablets (12.5 mg total) by mouth daily.  15 tablet  11  . Vilazodone HCl (VIIBRYD) 40 MG TABS Take 1 tablet by mouth daily.  140 tablet  0  . amoxicillin (AMOXIL) 500 MG capsule Take 4 tabs  1  Hour prior to dental visit./cy  4 capsule  2  . fluticasone (FLONASE) 50 MCG/ACT nasal spray Place 2 sprays into the nose daily.  1 g  1  . potassium chloride (K-DUR) 10 MEQ tablet Take 10 mEq by mouth daily. ( STOPPED )      . DISCONTD: Alum & Mag Hydroxide-Simeth (MAGIC MOUTHWASH) SOLN Take 5 mLs by mouth 3 (three) times daily. 14 days then  stop         Allergies  Allergen Reactions  . Tetanus Toxoids Swelling  . Nsaids     REACTION: proteinuria  . Sulfonamide Derivatives     History   Social History  . Marital Status: Married    Spouse Name: [name]    Number of Children: 2  . Years of Education: N/A   Occupational History  .     Social History Main Topics  . Smoking status: Never Smoker   . Smokeless tobacco: Never Used  . Alcohol Use: No  . Drug Use: No  . Sexually Active: Not Currently   Other Topics Concern  . Not on file   Social History Narrative  . No narrative on file    Family History  Problem Relation Age of Onset  . Cancer Father     bladder  . Cancer Maternal Aunt     ovarian  . Heart disease Maternal Grandfather   . Heart disease Paternal Grandfather   . Cancer Other     breast  . Dementia Other   . Hypertension Other    Physical Exam: Filed Vitals:   11/13/11 1023  BP: 110/60  Pulse: 91  Resp: 18  Height: 5\' 3"  (1.6 m)  Weight: 191 lb 1.9 oz (86.691 kg)    GEN- The patient is overweight appearing, alert and oriented x 3 today.   Head- normocephalic, atraumatic Eyes-  Sclera clear, conjunctiva pink Ears- hearing intact Oropharynx- clear Neck- supple, Lungs- Clear to ausculation bilaterally, normal work of breathing Abd- pacemaker pocket is well healed Heart- Regular rate and rhythm, 2/6 SEM LUSB, 1/6 diastolic murmur LUSB GI- soft, NT, ND, + BS Extremities- no clubbing, cyanosis, or edema   Pacemaker interrogation- reviewed in detail today,  See PACEART report  Assessment and Plan:

## 2011-11-13 NOTE — Assessment & Plan Note (Signed)
CHF is improved Recently creatinine was elevated with lasix  Decrease lasix to 40mg  QOD today 2 gram sodium diet and daily weights were encouraged today Follow-up with Dr Johnsie Cancel

## 2011-11-13 NOTE — Assessment & Plan Note (Signed)
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 When I saw her initially (see my prior note), I spoke with Dr Westley Gambles at [facility] who states that her RV threshold was elevated at implant.  He notes that the second (spare) RV epicardial lead was nonfunctional.   See Pace Art report No changes today   I spoke at length with the patient today about her elevated RV lead threshold.  I would recommend that we consider transvenous system placement in the future with ventricular lead placed in the distal coronary sinus if possible or RV (through replaced valve) if necessary.  This would hopefully improve device longevity in the long run. At this time, she is very clear that she does not want any further surgical procedures.  She will reconsider upon return to see me in November.

## 2011-11-14 ENCOUNTER — Encounter (HOSPITAL_COMMUNITY)
Admission: RE | Admit: 2011-11-14 | Discharge: 2011-11-14 | Disposition: A | Discharge status: Home / Self Care | Payer: Medicare Other | Source: Ambulatory Visit | Attending: Cardiovascular Disease | Admitting: Cardiovascular Disease

## 2011-11-14 ENCOUNTER — Telehealth: Payer: Self-pay | Admitting: *Deleted

## 2011-11-14 DIAGNOSIS — E876 Hypokalemia: Secondary | ICD-10-CM

## 2011-11-14 NOTE — Telephone Encounter (Signed)
I spoke with pt about low potassium level and reviewed with Cecille Rubin NP.  Asked pt to take supplemental potassium qod when taking lasix. Increase potassium rich diet Return for lab work on Wednesday next week for BMP. Horton Chin RN

## 2011-11-17 ENCOUNTER — Encounter (HOSPITAL_COMMUNITY)
Admission: RE | Admit: 2011-11-17 | Discharge: 2011-11-17 | Disposition: A | Discharge status: Home / Self Care | Payer: Medicare Other | Source: Ambulatory Visit | Attending: Cardiovascular Disease | Admitting: Cardiovascular Disease

## 2011-11-19 ENCOUNTER — Other Ambulatory Visit (INDEPENDENT_AMBULATORY_CARE_PROVIDER_SITE_OTHER): Payer: Medicare Other

## 2011-11-19 ENCOUNTER — Encounter (HOSPITAL_COMMUNITY)
Admission: RE | Admit: 2011-11-19 | Discharge: 2011-11-19 | Disposition: A | Discharge status: Home / Self Care | Payer: Medicare Other | Source: Ambulatory Visit | Attending: Cardiovascular Disease | Admitting: Cardiovascular Disease

## 2011-11-19 DIAGNOSIS — E876 Hypokalemia: Secondary | ICD-10-CM

## 2011-11-20 LAB — BASIC METABOLIC PANEL
BUN: 21 mg/dL (ref 6–23)
CO2: 34 mEq/L — ABNORMAL HIGH (ref 19–32)
Calcium: 10.1 mg/dL (ref 8.4–10.5)
Chloride: 100 mEq/L (ref 96–112)
Creatinine, Ser: 1.3 mg/dL — ABNORMAL HIGH (ref 0.4–1.2)

## 2011-11-21 ENCOUNTER — Encounter (HOSPITAL_COMMUNITY)
Admission: RE | Admit: 2011-11-21 | Discharge: 2011-11-21 | Disposition: A | Discharge status: Home / Self Care | Payer: Medicare Other | Source: Ambulatory Visit | Attending: Cardiovascular Disease | Admitting: Cardiovascular Disease

## 2011-11-24 ENCOUNTER — Encounter (HOSPITAL_COMMUNITY)
Admission: RE | Admit: 2011-11-24 | Discharge: 2011-11-24 | Disposition: A | Discharge status: Home / Self Care | Payer: Medicare Other | Source: Ambulatory Visit | Attending: Cardiovascular Disease | Admitting: Cardiovascular Disease

## 2011-11-25 ENCOUNTER — Other Ambulatory Visit: Payer: Self-pay | Admitting: *Deleted

## 2011-11-25 NOTE — Telephone Encounter (Signed)
Hydromet last filled on 3/31, #120 ml with 0 refills

## 2011-11-26 ENCOUNTER — Encounter (HOSPITAL_COMMUNITY)
Admission: RE | Admit: 2011-11-26 | Discharge: 2011-11-26 | Disposition: A | Discharge status: Home / Self Care | Payer: Medicare Other | Source: Ambulatory Visit | Attending: Cardiovascular Disease | Admitting: Cardiovascular Disease

## 2011-11-26 NOTE — Telephone Encounter (Signed)
It is time for her to stop taking cough medicine

## 2011-11-26 NOTE — Telephone Encounter (Signed)
Pt informed on home VM

## 2011-11-28 ENCOUNTER — Encounter (HOSPITAL_COMMUNITY)
Admission: RE | Admit: 2011-11-28 | Discharge: 2011-11-28 | Disposition: A | Discharge status: Home / Self Care | Payer: Medicare Other | Source: Ambulatory Visit | Attending: Cardiovascular Disease | Admitting: Cardiovascular Disease

## 2011-12-01 ENCOUNTER — Encounter (HOSPITAL_COMMUNITY)
Admission: RE | Admit: 2011-12-01 | Discharge: 2011-12-01 | Disposition: A | Discharge status: Home / Self Care | Payer: Medicare Other | Source: Ambulatory Visit | Attending: Cardiovascular Disease | Admitting: Cardiovascular Disease

## 2011-12-01 ENCOUNTER — Encounter: Payer: Medicare Other | Admitting: Internal Medicine

## 2011-12-03 ENCOUNTER — Encounter (HOSPITAL_COMMUNITY)
Admission: RE | Admit: 2011-12-03 | Discharge: 2011-12-03 | Disposition: A | Discharge status: Home / Self Care | Payer: Medicare Other | Source: Ambulatory Visit | Attending: Cardiovascular Disease | Admitting: Cardiovascular Disease

## 2011-12-03 DIAGNOSIS — Z5189 Encounter for other specified aftercare: Secondary | ICD-10-CM | POA: Insufficient documentation

## 2011-12-03 DIAGNOSIS — J4489 Other specified chronic obstructive pulmonary disease: Secondary | ICD-10-CM | POA: Insufficient documentation

## 2011-12-03 DIAGNOSIS — J449 Chronic obstructive pulmonary disease, unspecified: Secondary | ICD-10-CM | POA: Insufficient documentation

## 2011-12-03 DIAGNOSIS — I4891 Unspecified atrial fibrillation: Secondary | ICD-10-CM | POA: Insufficient documentation

## 2011-12-03 DIAGNOSIS — I079 Rheumatic tricuspid valve disease, unspecified: Secondary | ICD-10-CM | POA: Insufficient documentation

## 2011-12-03 DIAGNOSIS — G4733 Obstructive sleep apnea (adult) (pediatric): Secondary | ICD-10-CM | POA: Insufficient documentation

## 2011-12-03 DIAGNOSIS — I422 Other hypertrophic cardiomyopathy: Secondary | ICD-10-CM | POA: Insufficient documentation

## 2011-12-03 DIAGNOSIS — I442 Atrioventricular block, complete: Secondary | ICD-10-CM | POA: Insufficient documentation

## 2011-12-03 DIAGNOSIS — I10 Essential (primary) hypertension: Secondary | ICD-10-CM | POA: Insufficient documentation

## 2011-12-03 DIAGNOSIS — Q254 Congenital malformation of aorta unspecified: Secondary | ICD-10-CM | POA: Insufficient documentation

## 2011-12-03 DIAGNOSIS — Z9889 Other specified postprocedural states: Secondary | ICD-10-CM | POA: Insufficient documentation

## 2011-12-03 DIAGNOSIS — F329 Major depressive disorder, single episode, unspecified: Secondary | ICD-10-CM | POA: Insufficient documentation

## 2011-12-03 DIAGNOSIS — I252 Old myocardial infarction: Secondary | ICD-10-CM | POA: Insufficient documentation

## 2011-12-03 DIAGNOSIS — Q210 Ventricular septal defect: Secondary | ICD-10-CM | POA: Insufficient documentation

## 2011-12-03 DIAGNOSIS — F3289 Other specified depressive episodes: Secondary | ICD-10-CM | POA: Insufficient documentation

## 2011-12-04 ENCOUNTER — Ambulatory Visit (INDEPENDENT_AMBULATORY_CARE_PROVIDER_SITE_OTHER): Payer: Medicare Other | Admitting: Internal Medicine

## 2011-12-04 ENCOUNTER — Other Ambulatory Visit (INDEPENDENT_AMBULATORY_CARE_PROVIDER_SITE_OTHER): Payer: Medicare Other

## 2011-12-04 ENCOUNTER — Encounter: Payer: Self-pay | Admitting: Internal Medicine

## 2011-12-04 VITALS — BP 110/62 | HR 90 | Temp 97.4°F | Resp 16 | Ht 64.0 in | Wt 193.6 lb

## 2011-12-04 DIAGNOSIS — D649 Anemia, unspecified: Secondary | ICD-10-CM

## 2011-12-04 DIAGNOSIS — D510 Vitamin B12 deficiency anemia due to intrinsic factor deficiency: Secondary | ICD-10-CM

## 2011-12-04 DIAGNOSIS — R7309 Other abnormal glucose: Secondary | ICD-10-CM

## 2011-12-04 DIAGNOSIS — D638 Anemia in other chronic diseases classified elsewhere: Secondary | ICD-10-CM | POA: Insufficient documentation

## 2011-12-04 DIAGNOSIS — I10 Essential (primary) hypertension: Secondary | ICD-10-CM

## 2011-12-04 DIAGNOSIS — E785 Hyperlipidemia, unspecified: Secondary | ICD-10-CM | POA: Insufficient documentation

## 2011-12-04 DIAGNOSIS — N259 Disorder resulting from impaired renal tubular function, unspecified: Secondary | ICD-10-CM

## 2011-12-04 DIAGNOSIS — J4489 Other specified chronic obstructive pulmonary disease: Secondary | ICD-10-CM

## 2011-12-04 DIAGNOSIS — R739 Hyperglycemia, unspecified: Secondary | ICD-10-CM

## 2011-12-04 DIAGNOSIS — E7800 Pure hypercholesterolemia, unspecified: Secondary | ICD-10-CM

## 2011-12-04 DIAGNOSIS — I4891 Unspecified atrial fibrillation: Secondary | ICD-10-CM

## 2011-12-04 DIAGNOSIS — J449 Chronic obstructive pulmonary disease, unspecified: Secondary | ICD-10-CM

## 2011-12-04 DIAGNOSIS — Z7901 Long term (current) use of anticoagulants: Secondary | ICD-10-CM

## 2011-12-04 LAB — CBC WITH DIFFERENTIAL/PLATELET
Basophils Absolute: 0 10*3/uL (ref 0.0–0.1)
Basophils Relative: 0.5 % (ref 0.0–3.0)
Eosinophils Absolute: 0.2 10*3/uL (ref 0.0–0.7)
Eosinophils Relative: 3.8 % (ref 0.0–5.0)
HCT: 35.3 % — ABNORMAL LOW (ref 36.0–46.0)
Hemoglobin: 11.8 g/dL — ABNORMAL LOW (ref 12.0–15.0)
Lymphocytes Relative: 17.7 % (ref 12.0–46.0)
Lymphs Abs: 1 10*3/uL (ref 0.7–4.0)
MCHC: 33.4 g/dL (ref 30.0–36.0)
MCV: 86.6 fl (ref 78.0–100.0)
Monocytes Absolute: 0.5 10*3/uL (ref 0.1–1.0)
Monocytes Relative: 9.7 % (ref 3.0–12.0)
Neutro Abs: 3.8 10*3/uL (ref 1.4–7.7)
Neutrophils Relative %: 68.3 % (ref 43.0–77.0)
Platelets: 149 10*3/uL — ABNORMAL LOW (ref 150.0–400.0)
RBC: 4.08 Mil/uL (ref 3.87–5.11)
RDW: 15.4 % — ABNORMAL HIGH (ref 11.5–14.6)
WBC: 5.6 10*3/uL (ref 4.5–10.5)

## 2011-12-04 LAB — COMPREHENSIVE METABOLIC PANEL WITH GFR
ALT: 18 U/L (ref 0–35)
AST: 26 U/L (ref 0–37)
Albumin: 4.1 g/dL (ref 3.5–5.2)
Alkaline Phosphatase: 97 U/L (ref 39–117)
BUN: 26 mg/dL — ABNORMAL HIGH (ref 6–23)
CO2: 30 meq/L (ref 19–32)
Calcium: 10.1 mg/dL (ref 8.4–10.5)
Chloride: 100 meq/L (ref 96–112)
Creatinine, Ser: 1.4 mg/dL — ABNORMAL HIGH (ref 0.4–1.2)
GFR: 39.06 mL/min — ABNORMAL LOW
Glucose, Bld: 157 mg/dL — ABNORMAL HIGH (ref 70–99)
Potassium: 4 meq/L (ref 3.5–5.1)
Sodium: 138 meq/L (ref 135–145)
Total Bilirubin: 1 mg/dL (ref 0.3–1.2)
Total Protein: 8.1 g/dL (ref 6.0–8.3)

## 2011-12-04 LAB — VITAMIN B12: Vitamin B-12: >1500 pg/mL — ABNORMAL HIGH (ref 211–911)

## 2011-12-04 LAB — IBC PANEL
Iron: 56 ug/dL (ref 42–145)
Transferrin: 264.9 mg/dL (ref 212.0–360.0)

## 2011-12-04 LAB — HEMOGLOBIN A1C: Hgb A1c MFr Bld: 7 % — ABNORMAL HIGH (ref 4.6–6.5)

## 2011-12-04 LAB — FERRITIN: Ferritin: 39.9 ng/mL (ref 10.0–291.0)

## 2011-12-04 MED ORDER — HYDROCODONE-HOMATROPINE 5-1.5 MG/5ML PO SYRP: 5.0000 mL | ORAL_SOLUTION | Freq: Three times a day (TID) | ORAL | Status: AC | PRN

## 2011-12-04 NOTE — Assessment & Plan Note (Signed)
a1c today to see if she has developed DM II

## 2011-12-04 NOTE — Assessment & Plan Note (Signed)
CBC and iron levels today

## 2011-12-04 NOTE — Assessment & Plan Note (Signed)
FLP today 

## 2011-12-04 NOTE — Progress Notes (Signed)
Subjective:    Patient ID: Holly Simmons, female    DOB: 1940/08/07, 71 y.o.   MRN: [id number]  Cough This is a chronic problem. The current episode started more than 1 month ago. The problem has been unchanged. The problem occurs every few hours. The cough is non-productive. Pertinent negatives include no chest pain, chills, ear congestion, ear pain, fever, headaches, heartburn, hemoptysis, myalgias, nasal congestion, postnasal drip, rash, rhinorrhea, sore throat, shortness of breath, sweats, weight loss or wheezing. The symptoms are aggravated by cold air. Treatments tried: she has not been using her symbicort. Her past medical history is significant for COPD.  Hypertension This is a chronic problem. The current episode started more than 1 year ago. The problem has been gradually improving since onset. The problem is controlled. Pertinent negatives include no anxiety, blurred vision, chest pain, headaches, malaise/fatigue, neck pain, orthopnea, palpitations, peripheral edema, PND, shortness of breath or sweats. Past treatments include diuretics and beta blockers. The current treatment provides significant improvement. Compliance problems include exercise and diet.  Identifiable causes of hypertension include sleep apnea.      Review of Systems  Constitutional: Negative for fever, chills, weight loss, malaise/fatigue, diaphoresis, activity change, appetite change, fatigue and unexpected weight change.  HENT: Negative for ear pain, sore throat, rhinorrhea, neck pain and postnasal drip.   Eyes: Negative.  Negative for blurred vision.  Respiratory: Positive for cough. Negative for apnea, hemoptysis, choking, chest tightness, shortness of breath, wheezing and stridor.   Cardiovascular: Negative for chest pain, palpitations, orthopnea, leg swelling and PND.  Gastrointestinal: Negative for heartburn, nausea, vomiting, abdominal pain, diarrhea, constipation, blood in stool and abdominal distention.   Genitourinary: Negative.   Musculoskeletal: Negative for myalgias, back pain, joint swelling, arthralgias and gait problem.  Skin: Negative for color change, pallor, rash and wound.  Neurological: Negative for headaches.  Hematological: Negative for adenopathy. Does not bruise/bleed easily.       Objective:   Physical Exam  Vitals reviewed. Constitutional: She is oriented to person, place, and time. She appears well-developed and well-nourished. No distress.  HENT:  Head: Normocephalic and atraumatic.  Mouth/Throat: Oropharynx is clear and moist. No oropharyngeal exudate.  Eyes: Conjunctivae are normal. Right eye exhibits no discharge. Left eye exhibits no discharge. No scleral icterus.  Neck: Normal range of motion. Neck supple. No JVD present. No tracheal deviation present. No thyromegaly present.  Cardiovascular: Normal rate, regular rhythm, S1 normal, S2 normal and intact distal pulses.  Exam reveals no gallop and no friction rub.   Murmur heard.  Systolic murmur is present with a grade of 2/6   Diastolic murmur is present with a grade of 1/6  Pulses:      Carotid pulses are 1+ on the right side, and 1+ on the left side.      Radial pulses are 1+ on the right side, and 1+ on the left side.       Femoral pulses are 1+ on the right side, and 1+ on the left side.      Popliteal pulses are 1+ on the right side, and 1+ on the left side.       Dorsalis pedis pulses are 1+ on the right side, and 1+ on the left side.       Posterior tibial pulses are 1+ on the right side, and 1+ on the left side.  Pulmonary/Chest: Effort normal and breath sounds normal. No stridor. No respiratory distress. She has no wheezes. She has no rales. She exhibits  no tenderness.  Abdominal: Soft. Bowel sounds are normal. She exhibits no distension and no mass. There is no tenderness. There is no rebound and no guarding.  Musculoskeletal: Normal range of motion. She exhibits no edema and no tenderness.    Lymphadenopathy:    She has no cervical adenopathy.  Neurological: She is oriented to person, place, and time.  Skin: Skin is warm and dry. No rash noted. She is not diaphoretic. No erythema. No pallor.  Psychiatric: She has a normal mood and affect. Her behavior is normal. Judgment and thought content normal.      Lab Results  Component Value Date   WBC 6.0 10/10/2011   HGB 10.4* 10/10/2011   HCT 34.0* 10/10/2011   PLT 167 10/10/2011   GLUCOSE 154* 11/19/2011   CHOL 147 05/14/2011   TRIG 76.0 05/14/2011   HDL 53.90 05/14/2011   LDLCALC 78 05/14/2011   ALT 39* 05/14/2011   AST 33 05/14/2011   NA 140 11/19/2011   K 5.2* 11/19/2011   CL 100 11/19/2011   CREATININE 1.3* 11/19/2011   BUN 21 11/19/2011   CO2 34* 11/19/2011   TSH 2.02 05/14/2011   INR 1.9 09/15/2011      Assessment & Plan:

## 2011-12-04 NOTE — Assessment & Plan Note (Signed)
She will restart the symbicort and will continue to use hycodan as needed for cough

## 2011-12-04 NOTE — Assessment & Plan Note (Signed)
Her BP is well controlled, I will check her lytes today, her last K+ level was elevated so I have advised her to stop the K+ replacement

## 2011-12-04 NOTE — Assessment & Plan Note (Signed)
CBC/B12/folate levels today

## 2011-12-04 NOTE — Assessment & Plan Note (Signed)
I will recheck her renal function today. °

## 2011-12-04 NOTE — Patient Instructions (Signed)
Chronic Obstructive Pulmonary Disease Chronic obstructive pulmonary disease (COPD) is a condition in which airflow from the lungs is restricted. The lungs can never return to normal, but there are measures you can take which will improve them and make you feel better. CAUSES   Smoking.   Exposure to secondhand smoke.   Breathing in irritants (pollution, cigarette smoke, strong smells, aerosol sprays, paint fumes).   History of lung infections.  TREATMENT  Treatment focuses on making you comfortable (supportive care). Your caregiver may prescribe medications (inhaled or pills) to help improve your breathing. HOME CARE INSTRUCTIONS   If you smoke, stop smoking.   Avoid exposure to smoke, chemicals, and fumes that aggravate your breathing.   Take antibiotic medicines as directed by your caregiver.   Avoid medicines that dry up your system and slow down the elimination of secretions (antihistamines and cough syrups). This decreases respiratory capacity and may lead to infections.   Drink enough water and fluids to keep your urine clear or pale yellow. This loosens secretions.   Use humidifiers at home and at your bedside if they do not make breathing difficult.   Receive all protective vaccines your caregiver suggests, especially pneumococcal and influenza.   Use home oxygen as suggested.   Stay active. Exercise and physical activity will help maintain your ability to do things you want to do.   Eat a healthy diet.  SEEK MEDICAL CARE IF:   You develop pus-like mucus (sputum).   Breathing is more labored or exercise becomes difficult to do.   You are running out of the medicine you take for your breathing.  SEEK IMMEDIATE MEDICAL CARE IF:   You have a rapid heart rate.   You have agitation, confusion, tremors, or are in a stupor (family members may need to observe this).   It becomes difficult to breathe.   You develop chest pain.   You have a fever.  MAKE SURE YOU:    Understand these instructions.   Will watch your condition.   Will get help right away if you are not doing well or get worse.  Document Released: 04/30/2005 Document Revised: 07/10/2011 Document Reviewed: 09/20/2010 [facility] Patient Information 2012 [place].Hypertension As your heart beats, it forces blood through your arteries. This force is your blood pressure. If the pressure is too high, it is called hypertension (HTN) or high blood pressure. HTN is dangerous because you may have it and not know it. High blood pressure may mean that your heart has to work harder to pump blood. Your arteries may be narrow or stiff. The extra work puts you at risk for heart disease, stroke, and other problems.  Blood pressure consists of two numbers, a higher number over a lower, 110/72, for example. It is stated as "110 over 72." The ideal is below 120 for the top number (systolic) and under 80 for the bottom (diastolic). Write down your blood pressure today. You should pay close attention to your blood pressure if you have certain conditions such as:  Heart failure.   Prior heart attack.   Diabetes   Chronic kidney disease.   Prior stroke.   Multiple risk factors for heart disease.  To see if you have HTN, your blood pressure should be measured while you are seated with your arm held at the level of the heart. It should be measured at least twice. A one-time elevated blood pressure reading (especially in the Emergency Department) does not mean that you need treatment. There  may be conditions in which the blood pressure is different between your right and left arms. It is important to see your caregiver soon for a recheck. Most people have essential hypertension which means that there is not a specific cause. This type of high blood pressure may be lowered by changing lifestyle factors such as:  Stress.   Smoking.   Lack of exercise.   Excessive weight.   Drug/tobacco/alcohol use.    Eating less salt.  Most people do not have symptoms from high blood pressure until it has caused damage to the body. Effective treatment can often prevent, delay or reduce that damage. TREATMENT  When a cause has been identified, treatment for high blood pressure is directed at the cause. There are a large number of medications to treat HTN. These fall into several categories, and your caregiver will help you select the medicines that are best for you. Medications may have side effects. You should review side effects with your caregiver. If your blood pressure stays high after you have made lifestyle changes or started on medicines,   Your medication(s) may need to be changed.   Other problems may need to be addressed.   Be certain you understand your prescriptions, and know how and when to take your medicine.   Be sure to follow up with your caregiver within the time frame advised (usually within two weeks) to have your blood pressure rechecked and to review your medications.   If you are taking more than one medicine to lower your blood pressure, make sure you know how and at what times they should be taken. Taking two medicines at the same time can result in blood pressure that is too low.  SEEK IMMEDIATE MEDICAL CARE IF:  You develop a severe headache, blurred or changing vision, or confusion.   You have unusual weakness or numbness, or a faint feeling.   You have severe chest or abdominal pain, vomiting, or breathing problems.  MAKE SURE YOU:   Understand these instructions.   Will watch your condition.   Will get help right away if you are not doing well or get worse.  Document Released: 07/21/2005 Document Revised: 07/10/2011 Document Reviewed: 03/10/2008 [facility] Patient Information 2012 [place].

## 2011-12-05 ENCOUNTER — Encounter (HOSPITAL_COMMUNITY)
Admission: RE | Admit: 2011-12-05 | Discharge: 2011-12-05 | Disposition: A | Discharge status: Home / Self Care | Payer: Medicare Other | Source: Ambulatory Visit | Attending: Cardiovascular Disease | Admitting: Cardiovascular Disease

## 2011-12-05 ENCOUNTER — Ambulatory Visit: Payer: Medicare Other

## 2011-12-05 DIAGNOSIS — D510 Vitamin B12 deficiency anemia due to intrinsic factor deficiency: Secondary | ICD-10-CM

## 2011-12-05 DIAGNOSIS — D649 Anemia, unspecified: Secondary | ICD-10-CM

## 2011-12-05 LAB — FOLATE: Folate: >20 ng/mL

## 2011-12-07 ENCOUNTER — Encounter: Payer: Self-pay | Admitting: Internal Medicine

## 2011-12-08 ENCOUNTER — Encounter (HOSPITAL_COMMUNITY)
Admission: RE | Admit: 2011-12-08 | Discharge: 2011-12-08 | Disposition: A | Discharge status: Home / Self Care | Payer: Medicare Other | Source: Ambulatory Visit | Attending: Cardiovascular Disease | Admitting: Cardiovascular Disease

## 2011-12-10 ENCOUNTER — Encounter (HOSPITAL_COMMUNITY)
Admission: RE | Admit: 2011-12-10 | Discharge: 2011-12-10 | Disposition: A | Discharge status: Home / Self Care | Payer: Medicare Other | Source: Ambulatory Visit | Attending: Cardiovascular Disease | Admitting: Cardiovascular Disease

## 2011-12-12 ENCOUNTER — Encounter (HOSPITAL_COMMUNITY): Payer: Medicare Other

## 2011-12-15 ENCOUNTER — Encounter (HOSPITAL_COMMUNITY): Payer: Medicare Other

## 2011-12-17 ENCOUNTER — Encounter (HOSPITAL_COMMUNITY): Payer: Medicare Other

## 2011-12-19 ENCOUNTER — Encounter (HOSPITAL_COMMUNITY): Payer: Medicare Other

## 2011-12-31 ENCOUNTER — Ambulatory Visit: Payer: Medicare Other | Admitting: Internal Medicine

## 2012-01-05 ENCOUNTER — Ambulatory Visit: Payer: Self-pay | Admitting: Cardiology

## 2012-01-05 DIAGNOSIS — Z7901 Long term (current) use of anticoagulants: Secondary | ICD-10-CM

## 2012-01-05 DIAGNOSIS — I4891 Unspecified atrial fibrillation: Secondary | ICD-10-CM

## 2012-01-07 ENCOUNTER — Other Ambulatory Visit: Payer: Self-pay | Admitting: Internal Medicine

## 2012-01-22 ENCOUNTER — Encounter: Payer: Self-pay | Admitting: Cardiovascular Disease

## 2012-01-22 ENCOUNTER — Ambulatory Visit (INDEPENDENT_AMBULATORY_CARE_PROVIDER_SITE_OTHER): Payer: Medicare Other | Admitting: Cardiovascular Disease

## 2012-01-22 VITALS — BP 119/72 | HR 95 | Ht 63.0 in | Wt 192.8 lb

## 2012-01-22 DIAGNOSIS — F3289 Other specified depressive episodes: Secondary | ICD-10-CM

## 2012-01-22 DIAGNOSIS — E7800 Pure hypercholesterolemia, unspecified: Secondary | ICD-10-CM

## 2012-01-22 DIAGNOSIS — I10 Essential (primary) hypertension: Secondary | ICD-10-CM

## 2012-01-22 DIAGNOSIS — F329 Major depressive disorder, single episode, unspecified: Secondary | ICD-10-CM

## 2012-01-22 DIAGNOSIS — I442 Atrioventricular block, complete: Secondary | ICD-10-CM

## 2012-01-22 MED ORDER — ARMODAFINIL 150 MG PO TABS: 150.0000 mg | ORAL_TABLET | ORAL | Status: DC | PRN

## 2012-01-22 NOTE — Progress Notes (Signed)
Patient ID: Holly Simmons, female   DOB: Dec 28, 1940, 71 y.o.   MRN: [id number] She has a fairly complex history. She has a history of HOCM and underwent surgery at [facility] recently. She was seen in followup by Dr. Rayann Heman 11/15/11 . She underwent myomectomy at [facility] 06/23/11. 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 She underwent cardioversion for atrial flutter and was placed on Coumadin and amiodarone. Epicardial leads are known to have a high threshold.  Echocardiogram 08/27/11: Status post septal myomectomy complicated by VSD now with VSD patch repair-patch covers the basal septal wall and leads to akinesis of the wall within that area, small to moderate residual VSD at the apical end of the patch, EF 50%, moderate diastolic dysfunction, no LVOT or SAM, trivial MR, TR with mildly elevated mean gradient.  Maintaining sinus rhythm and her amiodarone and Coumadin were both discontinued. Plans are to follow up with Dr. Rayann Heman as she may need coronary sinus pacing.  Fell twice in May with bruising of legs but ok  Still a bit depressed.    ROS: Denies fever, malais, weight loss, blurry vision, decreased visual acuity, cough, sputum, SOB, hemoptysis, pleuritic pain, palpitaitons, heartburn, abdominal pain, melena, lower extremity edema, claudication, or rash.  All other systems reviewed and negative  General: Affect appropriate Healthy:  appears stated age 71: normal Neck supple with no adenopathy JVP normal no bruits no thyromegaly Lungs clear with no wheezing and good diaphragmatic motion Heart:  S1/S2 VSD murmur, no rub, gallop or click PMI normal Abdomen: benighn, BS positve, no tenderness, no AAA Pacer in LLQ no bruit.  No HSM or HJR Distal pulses intact with no bruits No edema Neuro non-focal Skin warm and dry No  muscular weakness   Current Outpatient Prescriptions  Medication Sig Dispense Refill  . aspirin 81 MG tablet Take 81 mg by mouth daily.        . budesonide-formoterol (SYMBICORT) 160-4.5 MCG/ACT inhaler Inhale 2 puffs into the lungs 2 (two) times daily.        . Cholecalciferol (VITAMIN D) 2000 UNITS tablet Take 2,000 Units by mouth daily.        . fexofenadine (ALLEGRA) 180 MG tablet Take 180 mg by mouth daily. prn       . furosemide (LASIX) 40 MG tablet One by mouth every other day  30 tablet  11  . L-Methylfolate-B6-B12 (METANX) 3-35-2 MG TABS Take 1 tablet by mouth 2 (two) times daily.        . metoprolol (TOPROL-XL) 100 MG 24 hr tablet Take 1 tablet (100 mg total) by mouth daily.  30 tablet  11  . ranitidine (ZANTAC) 300 MG tablet Take 1 tablet (300 mg total) by mouth at bedtime.  90 tablet  3  . spironolactone (ALDACTONE) 12.5 mg TABS Take 0.5 tablets (12.5 mg total) by mouth daily.  15 tablet  11  . Vilazodone HCl (VIIBRYD) 40 MG TABS Take 1 tablet by mouth daily.  140 tablet  0  . amoxicillin (AMOXIL) 500 MG capsule Take 4 tabs  1  Hour prior to dental visit./cy  4 capsule  2  . Armodafinil (NUVIGIL) 150 MG tablet Take 1 tablet (150 mg total) by mouth daily.  30 tablet  5  . DISCONTD: Alum & Mag Hydroxide-Simeth (MAGIC MOUTHWASH) SOLN Take 5 mLs by mouth  3 (three) times daily. 14 days then stop         Allergies  Tetanus toxoids; Nsaids; and Sulfonamide derivatives  Electrocardiogram:  Assessment and Plan

## 2012-01-22 NOTE — Assessment & Plan Note (Signed)
Epicardial leads with high threshold.  F/U [facility] in October.  Option of coronary sinus lead in future

## 2012-01-22 NOTE — Assessment & Plan Note (Addendum)
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  F/U echo in 6 months.  Shunt should not be large.  MV repair with improved LVOT gradient but continued diastolic dysfunciton  Continue lasix and aldactone

## 2012-01-22 NOTE — Assessment & Plan Note (Signed)
Well controlled.  Continue current medications and low sodium Dash type diet.    

## 2012-01-22 NOTE — Patient Instructions (Addendum)
Your physician wants you to follow-up in:  DEC. OR JAN  WITH DR Johnsie Cancel   AND OCT WITH DR Rayann Heman You will receive a reminder letter in the mail two months in advance. If you don't receive a letter, please call our office to schedule the follow-up appointment. Your physician recommends that you continue on your current medications as directed. Please refer to the Current Medication list given to you today.

## 2012-01-22 NOTE — Assessment & Plan Note (Signed)
Slowly improving F/U primary not on antidepressant

## 2012-01-22 NOTE — Assessment & Plan Note (Signed)
Cholesterol is at goal.  Continue current dose of statin and diet Rx.  No myalgias or side effects.  F/U  LFT's in 6 months. Lab Results  Component Value Date   LDLCALC 78 05/14/2011

## 2012-01-26 ENCOUNTER — Ambulatory Visit: Payer: Medicare Other | Admitting: Cardiovascular Disease

## 2012-02-04 ENCOUNTER — Encounter: Payer: Self-pay | Admitting: Internal Medicine

## 2012-02-04 ENCOUNTER — Ambulatory Visit (INDEPENDENT_AMBULATORY_CARE_PROVIDER_SITE_OTHER): Payer: Medicare Other | Admitting: Internal Medicine

## 2012-02-04 VITALS — BP 108/58 | HR 84 | Temp 97.5°F | Resp 16 | Wt 190.8 lb

## 2012-02-04 DIAGNOSIS — I10 Essential (primary) hypertension: Secondary | ICD-10-CM

## 2012-02-04 DIAGNOSIS — IMO0001 Reserved for inherently not codable concepts without codable children: Secondary | ICD-10-CM

## 2012-02-04 DIAGNOSIS — M8580 Other specified disorders of bone density and structure, unspecified site: Secondary | ICD-10-CM | POA: Insufficient documentation

## 2012-02-04 DIAGNOSIS — Z1231 Encounter for screening mammogram for malignant neoplasm of breast: Secondary | ICD-10-CM

## 2012-02-04 DIAGNOSIS — I5032 Chronic diastolic (congestive) heart failure: Secondary | ICD-10-CM

## 2012-02-04 NOTE — Progress Notes (Signed)
Subjective:    Patient ID: Holly Simmons, female    DOB: [birth year], 71 y.o.   MRN: [id number]  Hypertension This is a chronic problem. The current episode started more than 1 year ago. The problem has been gradually improving since onset. The problem is controlled. Associated symptoms include shortness of breath (chronic, not worsening). Pertinent negatives include no anxiety, blurred vision, chest pain, headaches, malaise/fatigue, neck pain, orthopnea, palpitations, peripheral edema, PND or sweats. There are no associated agents to hypertension. Past treatments include beta blockers and diuretics. The current treatment provides significant improvement. There are no compliance problems.  Hypertensive end-organ damage includes kidney disease and heart failure.  Diabetes She presents for her follow-up diabetic visit. She has type 2 diabetes mellitus. Her disease course has been stable. There are no hypoglycemic associated symptoms. Pertinent negatives for hypoglycemia include no confusion, dizziness, headaches, nervousness/anxiousness, speech difficulty or sweats. Associated symptoms include foot paresthesias. Pertinent negatives for diabetes include no blurred vision, no chest pain, no fatigue, no foot ulcerations, no polydipsia, no polyphagia, no polyuria, no visual change, no weakness and no weight loss. There are no hypoglycemic complications. Symptoms are stable. Diabetic complications include heart disease, nephropathy and peripheral neuropathy. When asked about current treatments, none were reported. She is following a generally healthy diet. Meal planning includes avoidance of concentrated sweets. She has not had a previous visit with a dietician. She participates in exercise intermittently. There is no change in her home blood glucose trend. An ACE inhibitor/angiotensin II receptor blocker is contraindicated. She does not see a podiatrist.Eye exam is not current.      Review of Systems    Constitutional: Negative.  Negative for fever, chills, weight loss, malaise/fatigue, diaphoresis, activity change, appetite change, fatigue and unexpected weight change.  HENT: Negative.  Negative for neck pain.   Eyes: Negative.  Negative for blurred vision.  Respiratory: Positive for shortness of breath (chronic, not worsening). Negative for cough, chest tightness, wheezing and stridor.   Cardiovascular: Negative.  Negative for chest pain, palpitations, orthopnea, leg swelling and PND.  Gastrointestinal: Negative.  Negative for nausea, abdominal pain, diarrhea and constipation.  Genitourinary: Negative.  Negative for polyuria.  Musculoskeletal: Negative.   Skin: Negative.   Neurological: Negative.  Negative for dizziness, syncope, speech difficulty, weakness, light-headedness and headaches.  Hematological: Negative for polydipsia, polyphagia and adenopathy. Does not bruise/bleed easily.  Psychiatric/Behavioral: Positive for dysphoric mood. Negative for suicidal ideas, hallucinations, behavioral problems, confusion, disturbed wake/sleep cycle, self-injury, decreased concentration and agitation. The patient is not nervous/anxious and is not hyperactive.        Objective:   Physical Exam  Vitals reviewed. Constitutional: She is oriented to person, place, and time. She appears well-developed and well-nourished. No distress.  HENT:  Head: Normocephalic and atraumatic.  Mouth/Throat: Oropharynx is clear and moist. No oropharyngeal exudate.  Eyes: Conjunctivae are normal. Right eye exhibits no discharge. Left eye exhibits no discharge. No scleral icterus.  Neck: Normal range of motion. Neck supple. No JVD present. No tracheal deviation present. No thyromegaly present.  Cardiovascular: Normal rate, regular rhythm, S1 normal, S2 normal, intact distal pulses and normal pulses.  Exam reveals no gallop, no S3, no S4, no friction rub and no decreased pulses.   Murmur heard.  Systolic murmur is  present with a grade of 2/6   Diastolic murmur is present with a grade of 1/6  Pulmonary/Chest: Effort normal and breath sounds normal. No stridor. No respiratory distress. She has no wheezes. She has no rales. She  exhibits no tenderness.  Abdominal: Soft. Bowel sounds are normal. She exhibits no distension and no mass. There is no tenderness. There is no rebound and no guarding.  Musculoskeletal: Normal range of motion. She exhibits no edema and no tenderness.  Lymphadenopathy:    She has no cervical adenopathy.  Neurological: She is oriented to person, place, and time.  Skin: Skin is warm and dry. No rash noted. She is not diaphoretic. No erythema. No pallor.  Psychiatric: She has a normal mood and affect. Her behavior is normal. Judgment and thought content normal.      Lab Results  Component Value Date   WBC 5.6 12/04/2011   HGB 11.8* 12/04/2011   HCT 35.3* 12/04/2011   PLT 149.0* 12/04/2011   GLUCOSE 157* 12/04/2011   CHOL 147 05/14/2011   TRIG 76.0 05/14/2011   HDL 53.90 05/14/2011   LDLCALC 78 05/14/2011   ALT 18 12/04/2011   AST 26 12/04/2011   NA 138 12/04/2011   K 4.0 12/04/2011   CL 100 12/04/2011   CREATININE 1.4* 12/04/2011   BUN 26* 12/04/2011   CO2 30 12/04/2011   TSH 1.66 12/04/2011   INR 1.9 09/15/2011   HGBA1C 7.0* 12/04/2011      Assessment & Plan:

## 2012-02-04 NOTE — Patient Instructions (Signed)

## 2012-02-05 ENCOUNTER — Encounter: Payer: Self-pay | Admitting: Internal Medicine

## 2012-02-05 NOTE — Assessment & Plan Note (Signed)
She has a normal volume status today

## 2012-02-05 NOTE — Assessment & Plan Note (Signed)
Her BP is well controlled and her recent labs look good

## 2012-02-05 NOTE — Assessment & Plan Note (Signed)
No meds needed at this point, I will recheck her a1c in a few months

## 2012-02-06 ENCOUNTER — Telehealth: Payer: Self-pay

## 2012-02-06 MED ORDER — GLUCOSE BLOOD VI STRP: ORAL_STRIP | Status: DC

## 2012-02-06 MED ORDER — ONETOUCH DELICA LANCETS MISC: Status: DC

## 2012-02-06 NOTE — Telephone Encounter (Signed)
Patient called Holly Simmons stating that her pt instruction sheet advised her to check her CBG's, however she does not have any supplies. I returned call to patient and advised that onetouch meter will be placed upfront and we will send in rx for extra testing strips etc.

## 2012-02-12 LAB — HM DIABETES EYE EXAM

## 2012-02-26 ENCOUNTER — Ambulatory Visit (HOSPITAL_COMMUNITY)
Admission: RE | Admit: 2012-02-26 | Discharge: 2012-02-26 | Disposition: A | Discharge status: Home / Self Care | Payer: Medicare Other | Source: Ambulatory Visit | Attending: Internal Medicine | Admitting: Internal Medicine

## 2012-02-26 DIAGNOSIS — Z1231 Encounter for screening mammogram for malignant neoplasm of breast: Secondary | ICD-10-CM

## 2012-03-05 LAB — HM MAMMOGRAPHY: HM Mammogram: ABNORMAL

## 2012-03-09 ENCOUNTER — Ambulatory Visit
Admission: RE | Admit: 2012-03-09 | Discharge: 2012-03-09 | Disposition: A | Discharge status: Home / Self Care | Payer: Medicare Other | Source: Ambulatory Visit | Attending: Internal Medicine | Admitting: Internal Medicine

## 2012-03-09 ENCOUNTER — Other Ambulatory Visit: Payer: Self-pay | Admitting: Internal Medicine

## 2012-03-09 DIAGNOSIS — R928 Other abnormal and inconclusive findings on diagnostic imaging of breast: Secondary | ICD-10-CM

## 2012-04-08 ENCOUNTER — Ambulatory Visit (INDEPENDENT_AMBULATORY_CARE_PROVIDER_SITE_OTHER): Payer: Medicare Other | Admitting: Pulmonary Disease

## 2012-04-08 ENCOUNTER — Encounter: Payer: Self-pay | Admitting: Pulmonary Disease

## 2012-04-08 VITALS — BP 142/80 | HR 73 | Temp 97.4°F | Ht 63.0 in | Wt 192.8 lb

## 2012-04-08 DIAGNOSIS — G4733 Obstructive sleep apnea (adult) (pediatric): Secondary | ICD-10-CM

## 2012-04-08 DIAGNOSIS — G4710 Hypersomnia, unspecified: Secondary | ICD-10-CM

## 2012-04-08 MED ORDER — ARMODAFINIL 150 MG PO TABS: 150.0000 mg | ORAL_TABLET | ORAL | Status: DC | PRN

## 2012-04-08 NOTE — Assessment & Plan Note (Signed)
The patient is continuing on nuvigil as needed.  I have encouraged her to work on weight loss, and suspect that her daytime sleepiness will greatly improve with this.

## 2012-04-08 NOTE — Progress Notes (Signed)
  Subjective:    Patient ID: Holly Simmons, female    DOB: [birth year], 71 y.o.   MRN: [id number]  HPI Patient comes in today for followup of her known obstructive sleep apnea.  She is wearing CPAP as much as she can, and her download today shows about a 72% compliance with greater than or equal to 4 hours.  Her apnea is well controlled, and there is no significant mask leak.  She continues to have issues with daytime sleepiness despite wearing her CPAP, and uses Nuvigil as needed on days that she is required to have her best alertness.  She has lost some weight since her last visit.   Review of Systems  Constitutional: Negative.  Negative for fever and unexpected weight change.  HENT: Positive for rhinorrhea, postnasal drip and sinus pressure. Negative for ear pain, nosebleeds, congestion, sore throat, sneezing, trouble swallowing and dental problem.   Eyes: Positive for redness and itching.       Eye watering  Respiratory: Negative.  Negative for cough, chest tightness, shortness of breath and wheezing.   Cardiovascular: Positive for leg swelling. Negative for palpitations.  Gastrointestinal: Negative.  Negative for nausea and vomiting.  Genitourinary: Negative.  Negative for dysuria.  Musculoskeletal: Negative.  Negative for joint swelling.  Skin: Negative.  Negative for rash.  Neurological: Negative.  Negative for headaches.  Hematological: Negative.  Does not bruise/bleed easily.  Psychiatric/Behavioral: Positive for dysphoric mood. The patient is not nervous/anxious.        Change in mood since Nov 2012--after heart surgery       Objective:   Physical Exam Overweight female in no acute distress Skin breakdown or pressure necrosis from the CPAP mask Lower extremities with minimal edema, no cyanosis Alert and oriented, does not appear to be overly sleepy, moves all 4 extremities.       Assessment & Plan:

## 2012-04-08 NOTE — Patient Instructions (Addendum)
Continue to wear cpap as much as possible Work on weight loss Continue with nuvigil as needed.  Will send in a prescription for your.  followup with me in one year.

## 2012-04-08 NOTE — Assessment & Plan Note (Signed)
The patient is wearing CPAP fairly compliantly, but I have asked her to wear as much as possible.  I have also asked her to work more aggressively on weight loss.

## 2012-05-12 ENCOUNTER — Ambulatory Visit (INDEPENDENT_AMBULATORY_CARE_PROVIDER_SITE_OTHER): Payer: Medicare Other | Admitting: Internal Medicine

## 2012-05-12 ENCOUNTER — Encounter: Payer: Self-pay | Admitting: Internal Medicine

## 2012-05-12 ENCOUNTER — Other Ambulatory Visit (INDEPENDENT_AMBULATORY_CARE_PROVIDER_SITE_OTHER): Payer: Medicare Other

## 2012-05-12 ENCOUNTER — Encounter: Payer: Medicare Other | Admitting: Internal Medicine

## 2012-05-12 VITALS — BP 118/72 | HR 86 | Temp 97.6°F | Resp 16 | Wt 189.0 lb

## 2012-05-12 DIAGNOSIS — I10 Essential (primary) hypertension: Secondary | ICD-10-CM

## 2012-05-12 DIAGNOSIS — B354 Tinea corporis: Secondary | ICD-10-CM

## 2012-05-12 DIAGNOSIS — E7800 Pure hypercholesterolemia, unspecified: Secondary | ICD-10-CM

## 2012-05-12 DIAGNOSIS — F329 Major depressive disorder, single episode, unspecified: Secondary | ICD-10-CM

## 2012-05-12 DIAGNOSIS — Z0000 Encounter for general adult medical examination without abnormal findings: Secondary | ICD-10-CM

## 2012-05-12 DIAGNOSIS — IMO0001 Reserved for inherently not codable concepts without codable children: Secondary | ICD-10-CM

## 2012-05-12 DIAGNOSIS — F3289 Other specified depressive episodes: Secondary | ICD-10-CM

## 2012-05-12 DIAGNOSIS — I4891 Unspecified atrial fibrillation: Secondary | ICD-10-CM

## 2012-05-12 DIAGNOSIS — Z23 Encounter for immunization: Secondary | ICD-10-CM

## 2012-05-12 LAB — COMPREHENSIVE METABOLIC PANEL
AST: 21 U/L (ref 0–37)
Albumin: 4.3 g/dL (ref 3.5–5.2)
Alkaline Phosphatase: 118 U/L — ABNORMAL HIGH (ref 39–117)
Calcium: 10.4 mg/dL (ref 8.4–10.5)
Chloride: 100 mEq/L (ref 96–112)
Glucose, Bld: 123 mg/dL — ABNORMAL HIGH (ref 70–99)
Potassium: 4.2 mEq/L (ref 3.5–5.1)
Sodium: 140 mEq/L (ref 135–145)
Total Bilirubin: 0.8 mg/dL (ref 0.3–1.2)
Total Protein: 9.2 g/dL — ABNORMAL HIGH (ref 6.0–8.3)

## 2012-05-12 LAB — HEMOGLOBIN A1C: Hgb A1c MFr Bld: 6.7 % — ABNORMAL HIGH (ref 4.6–6.5)

## 2012-05-12 LAB — LIPID PANEL: VLDL: 19.4 mg/dL (ref 0.0–40.0)

## 2012-05-12 MED ORDER — CICLOPIROX 0.77 % EX GEL: 1.0000 | Freq: Two times a day (BID) | CUTANEOUS | Status: DC

## 2012-05-12 MED ORDER — BUPROPION HCL ER (XL) 150 MG PO TB24: 150.0000 mg | ORAL_TABLET | Freq: Every day | ORAL | Status: DC

## 2012-05-12 NOTE — Assessment & Plan Note (Signed)
FLP today 

## 2012-05-12 NOTE — Progress Notes (Signed)
Subjective:    Patient ID: Holly Simmons, female    DOB: 07-01-1941, 71 y.o.   MRN: [id number]  Rash This is a recurrent problem. The current episode started more than 1 month ago. The problem is unchanged. Pain location: both armpits and under both breasts. The rash is characterized by itchiness and redness. She was exposed to nothing. Pertinent negatives include no congestion, cough, diarrhea, fatigue, fever, joint pain, nail changes, rhinorrhea, shortness of breath, sore throat or vomiting. Treatments tried: ketoconazole cream. The treatment provided mild relief.  Diabetes She presents for her follow-up diabetic visit. She has type 2 diabetes mellitus. Her disease course has been stable. There are no hypoglycemic associated symptoms. Pertinent negatives for hypoglycemia include no confusion, dizziness, nervousness/anxiousness, pallor, speech difficulty or tremors. Pertinent negatives for diabetes include no blurred vision, no chest pain, no fatigue, no foot paresthesias, no foot ulcerations, no polydipsia, no polyphagia, no polyuria, no visual change, no weakness and no weight loss. There are no hypoglycemic complications. Symptoms are stable. There are no diabetic complications. When asked about current treatments, none were reported. Her weight is decreasing steadily. She is following a generally healthy diet. Meal planning includes avoidance of concentrated sweets. She has not had a previous visit with a dietician. She participates in exercise intermittently. There is no change in her home blood glucose trend. Her breakfast blood glucose range is generally 90-110 mg/dl. Her lunch blood glucose range is generally 110-130 mg/dl. Her dinner blood glucose range is generally 110-130 mg/dl. Her highest blood glucose is 110-130 mg/dl. Her overall blood glucose range is 110-130 mg/dl. An ACE inhibitor/angiotensin II receptor blocker is contraindicated. She does not see a podiatrist.Eye exam is current.       Review of Systems  Constitutional: Negative for fever, chills, weight loss, diaphoresis, activity change, appetite change, fatigue and unexpected weight change.  HENT: Negative.  Negative for congestion, sore throat and rhinorrhea.   Eyes: Negative.  Negative for blurred vision.  Respiratory: Negative for cough, chest tightness, shortness of breath, wheezing and stridor.   Cardiovascular: Negative for chest pain, palpitations and leg swelling.  Gastrointestinal: Negative.  Negative for nausea, vomiting, abdominal pain, diarrhea, constipation and blood in stool.  Genitourinary: Negative.  Negative for polyuria.  Musculoskeletal: Negative.  Negative for joint pain.  Skin: Positive for rash. Negative for nail changes, color change, pallor and wound.  Neurological: Negative for dizziness, tremors, syncope, speech difficulty, weakness and light-headedness.  Hematological: Negative for polydipsia, polyphagia and adenopathy. Does not bruise/bleed easily.  Psychiatric/Behavioral: Positive for disturbed wake/sleep cycle (hypersomnolence) and dysphoric mood ([place], feels worthless/helpless). Negative for suicidal ideas, hallucinations, behavioral problems, confusion, self-injury, decreased concentration and agitation. The patient is not nervous/anxious and is not hyperactive.        Objective:   Physical Exam  Vitals reviewed. Constitutional: She is oriented to person, place, and time. She appears well-developed and well-nourished. No distress.  HENT:  Head: Normocephalic and atraumatic.  Mouth/Throat: Oropharynx is clear and moist. No oropharyngeal exudate.  Eyes: Conjunctivae normal are normal. Right eye exhibits no discharge. Left eye exhibits no discharge. No scleral icterus.  Neck: Normal range of motion. Neck supple. No JVD present. No tracheal deviation present. No thyromegaly present.  Cardiovascular: Normal rate, regular rhythm, S1 normal, S2 normal and intact distal pulses.   Exam reveals no gallop and no friction rub.   Murmur heard.  Systolic murmur is present with a grade of 1/6   Diastolic murmur is present with a grade of 1/6  Pulses:      Carotid pulses are 1+ on the right side, and 1+ on the left side.      Radial pulses are 1+ on the right side, and 1+ on the left side.       Femoral pulses are 1+ on the right side, and 1+ on the left side.      Popliteal pulses are 1+ on the right side, and 1+ on the left side.       Dorsalis pedis pulses are 1+ on the right side, and 1+ on the left side.       Posterior tibial pulses are 1+ on the right side, and 1+ on the left side.  Pulmonary/Chest: Effort normal and breath sounds normal. No accessory muscle usage or stridor. Not tachypneic. No respiratory distress. She has no wheezes. She has no rales. She exhibits no mass, no tenderness, no bony tenderness, no crepitus and no edema. Right breast exhibits no inverted nipple, no mass, no nipple discharge, no skin change and no tenderness. Left breast exhibits no inverted nipple, no mass, no nipple discharge, no skin change and no tenderness. Breasts are symmetrical.  Abdominal: Soft. Bowel sounds are normal. She exhibits no distension and no mass. There is no tenderness. There is no rebound and no guarding.  Musculoskeletal: Normal range of motion. She exhibits no edema and no tenderness.  Lymphadenopathy:    She has no cervical adenopathy.  Neurological: She is oriented to person, place, and time.  Skin: Skin is warm and dry. Rash noted. No abrasion, no bruising, no burn, no ecchymosis, no laceration, no petechiae and no purpura noted. Rash is macular. Rash is not papular, not maculopapular, not nodular, not pustular, not vesicular and not urticarial. She is not diaphoretic. No erythema. No pallor.          There are confluent brownish/pink macules in both armpits and under both breasts  Psychiatric: She has a normal mood and affect. Her behavior is normal. Judgment and  thought content normal.     Lab Results  Component Value Date   WBC 5.6 12/04/2011   HGB 11.8* 12/04/2011   HCT 35.3* 12/04/2011   PLT 149.0* 12/04/2011   GLUCOSE 157* 12/04/2011   CHOL 147 05/14/2011   TRIG 76.0 05/14/2011   HDL 53.90 05/14/2011   LDLCALC 78 05/14/2011   ALT 18 12/04/2011   AST 26 12/04/2011   NA 138 12/04/2011   K 4.0 12/04/2011   CL 100 12/04/2011   CREATININE 1.4* 12/04/2011   BUN 26* 12/04/2011   CO2 30 12/04/2011   TSH 1.66 12/04/2011   INR 1.9 09/15/2011   HGBA1C 7.0* 12/04/2011       Assessment & Plan:

## 2012-05-12 NOTE — Assessment & Plan Note (Signed)
Her BP is well controlled, I will check her lytes and renal function today 

## 2012-05-12 NOTE — Assessment & Plan Note (Signed)
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

## 2012-05-12 NOTE — Assessment & Plan Note (Signed)

## 2012-05-12 NOTE — Patient Instructions (Signed)
Diabetes, Type 2 Diabetes is a long-lasting (chronic) disease. In type 2 diabetes, the pancreas does not make enough insulin (a hormone), and the body does not respond normally to the insulin that is made. This type of diabetes was also previously called adult-onset diabetes. It usually occurs after the age of 68, but it can occur at any age.  CAUSES  Type 2 diabetes happens because the pancreasis not making enough insulin or your body has trouble using the insulin that your pancreas does make properly. SYMPTOMS   Drinking more than usual.  Urinating more than usual.  Blurred vision.  Dry, itchy skin.  Frequent infections.  Feeling more tired than usual (fatigue). DIAGNOSIS The diagnosis of type 2 diabetes is usually made by one of the following tests:  Fasting blood glucose test. You will not eat for at least 8 hours and then take a blood test.  Random blood glucose test. Your blood glucose (sugar) is checked at any time of the day regardless of when you ate.  Oral glucose tolerance test (OGTT). Your blood glucose is measured after you have not eaten (fasted) and then after you drink a glucose containing beverage. TREATMENT   Healthy eating.  Exercise.  Medicine, if needed.  Monitoring blood glucose.  Seeing your caregiver regularly. HOME CARE INSTRUCTIONS   Check your blood glucose at least once a day. More frequent monitoring may be necessary, depending on your medicines and on how well your diabetes is controlled. Your caregiver will advise you.  Take your medicine as directed by your caregiver.  Do not smoke.  Make wise food choices. Ask your caregiver for information. Weight loss can improve your diabetes.  Learn about low blood glucose (hypoglycemia) and how to treat it.  Get your eyes checked regularly.  Have a yearly physical exam. Have your blood pressure checked and your blood and urine tested.  Wear a pendant or bracelet saying that you have  diabetes.  Check your feet every night for cuts, sores, blisters, and redness. Let your caregiver know if you have any problems. SEEK MEDICAL CARE IF:   You have problems keeping your blood glucose in target range.  You have problems with your medicines.  You have symptoms of an illness that do not improve after 24 hours.  You have a sore or wound that is not healing.  You notice a change in vision or a new problem with your vision.  You have a fever. MAKE SURE YOU:  Understand these instructions.  Will watch your condition.  Will get help right away if you are not doing well or get worse. Document Released: 07/21/2005 Document Revised: 10/13/2011 Document Reviewed: 01/06/2011 [facility] Patient Information 2013 [place]. Depression, Adult Depression refers to feeling sad, low, down in the dumps, blue, gloomy, or empty. In general, there are two kinds of depression: 1. Depression that we all experience from time to time because of upsetting life experiences, including the loss of a job or the ending of a relationship (normal sadness or normal grief). This kind of depression is considered normal, is short lived, and resolves within a few days to 2 weeks. (Depression experienced after the loss of a loved one is called bereavement. Bereavement often lasts longer than 2 weeks but normally gets better with time.) 2. Clinical depression, which lasts longer than normal sadness or normal grief or interferes with your ability to function at home, at work, and in school. It also interferes with your personal relationships. It affects almost  every aspect of your life. Clinical depression is an illness. Symptoms of depression also can be caused by conditions other than normal sadness and grief or clinical depression. Examples of these conditions are listed as follows:  Physical illness Some physical illnesses, including underactive thyroid gland (hypothyroidism), severe anemia, specific types  of cancer, diabetes, uncontrolled seizures, heart and lung problems, strokes, and chronic pain are commonly associated with symptoms of depression.  Side effects of some prescription medicine In some people, certain types of prescription medicine can cause symptoms of depression.  Substance abuse Abuse of alcohol and illicit drugs can cause symptoms of depression. SYMPTOMS Symptoms of normal sadness and normal grief include the following:  Feeling sad or crying for short periods of time.  Not caring about anything (apathy).  Difficulty sleeping or sleeping too much.  No longer able to enjoy the things you used to enjoy.  Desire to be by oneself all the time (social isolation).  Lack of energy or motivation.  Difficulty concentrating or remembering.  Change in appetite or weight.  Restlessness or agitation. Symptoms of clinical depression include the same symptoms of normal sadness or normal grief and also the following symptoms:  Feeling sad or crying all the time.  Feelings of guilt or worthlessness.  Feelings of hopelessness or helplessness.  Thoughts of suicide or the desire to harm yourself (suicidal ideation).  Loss of touch with reality (psychotic symptoms). Seeing or hearing things that are not real (hallucinations) or having false beliefs about your life or the people around you (delusions and paranoia). DIAGNOSIS  The diagnosis of clinical depression usually is based on the severity and duration of the symptoms. Your caregiver also will ask you questions about your medical history and substance use to find out if physical illness, use of prescription medicine, or substance abuse is causing your depression. Your caregiver also may order blood tests. TREATMENT  Typically, normal sadness and normal grief do not require treatment. However, sometimes antidepressant medicine is prescribed for bereavement to ease the depressive symptoms until they resolve. The treatment for  clinical depression depends on the severity of your symptoms but typically includes antidepressant medicine, counseling with a mental health professional, or a combination of both. Your caregiver will help to determine what treatment is best for you. Depression caused by physical illness usually goes away with appropriate medical treatment of the illness. If prescription medicine is causing depression, talk with your caregiver about stopping the medicine, decreasing the dose, or substituting another medicine. Depression caused by abuse of alcohol or illicit drugs abuse goes away with abstinence from these substances. Some adults need professional help in order to stop drinking or using drugs. SEEK IMMEDIATE CARE IF:  You have thoughts about hurting yourself or others.  You lose touch with reality (have psychotic symptoms).  You are taking medicine for depression and have a serious side effect. FOR MORE INFORMATION National Alliance on Mental Illness: www.nami.[employer]: https://carter.com/ Document Released: 07/18/2000 Document Revised: 01/20/2012 Document Reviewed: 10/20/2011 [facility] Patient Information 2013 [place].

## 2012-05-12 NOTE — Assessment & Plan Note (Signed)
I will recheck her a1c and will treat if needed

## 2012-05-12 NOTE — Assessment & Plan Note (Signed)
I think she has a yeast that is resistant to ketoconazole so I have changed to loprox for better coverage

## 2012-05-12 NOTE — Assessment & Plan Note (Signed)
She has good rate and rhythm control today 

## 2012-05-13 ENCOUNTER — Telehealth: Payer: Self-pay | Admitting: Internal Medicine

## 2012-05-13 NOTE — Telephone Encounter (Signed)
Pt has blood clots in urine. No burning.  She has an appt tomorrow morning.  Declined to talk to call a nurse.  Is there anything to do before appt tomorrow?

## 2012-05-13 NOTE — Telephone Encounter (Signed)
Bring in a urine

## 2012-05-14 ENCOUNTER — Ambulatory Visit (INDEPENDENT_AMBULATORY_CARE_PROVIDER_SITE_OTHER): Payer: Medicare Other | Admitting: Internal Medicine

## 2012-05-14 ENCOUNTER — Encounter: Payer: Self-pay | Admitting: Internal Medicine

## 2012-05-14 ENCOUNTER — Other Ambulatory Visit: Payer: Self-pay | Admitting: Internal Medicine

## 2012-05-14 ENCOUNTER — Other Ambulatory Visit (INDEPENDENT_AMBULATORY_CARE_PROVIDER_SITE_OTHER): Payer: Medicare Other

## 2012-05-14 VITALS — BP 110/68 | HR 85 | Temp 97.8°F | Resp 16

## 2012-05-14 DIAGNOSIS — R319 Hematuria, unspecified: Secondary | ICD-10-CM

## 2012-05-14 DIAGNOSIS — R799 Abnormal finding of blood chemistry, unspecified: Secondary | ICD-10-CM

## 2012-05-14 DIAGNOSIS — N259 Disorder resulting from impaired renal tubular function, unspecified: Secondary | ICD-10-CM

## 2012-05-14 DIAGNOSIS — N390 Urinary tract infection, site not specified: Secondary | ICD-10-CM

## 2012-05-14 DIAGNOSIS — R778 Other specified abnormalities of plasma proteins: Secondary | ICD-10-CM | POA: Insufficient documentation

## 2012-05-14 LAB — POCT URINALYSIS DIPSTICK
Ketones, UA: NEGATIVE
Protein, UA: 2000
pH, UA: 5

## 2012-05-14 LAB — URINALYSIS, ROUTINE W REFLEX MICROSCOPIC
Bilirubin Urine: NEGATIVE
Ketones, ur: NEGATIVE
Total Protein, Urine: 100
Urine Glucose: NEGATIVE
pH: 6 (ref 5.0–8.0)

## 2012-05-14 LAB — COMPREHENSIVE METABOLIC PANEL
AST: 20 U/L (ref 0–37)
Albumin: 3.9 g/dL (ref 3.5–5.2)
Alkaline Phosphatase: 108 U/L (ref 39–117)
Potassium: 3.8 mEq/L (ref 3.5–5.1)
Sodium: 138 mEq/L (ref 135–145)
Total Bilirubin: 0.7 mg/dL (ref 0.3–1.2)
Total Protein: 8.2 g/dL (ref 6.0–8.3)

## 2012-05-14 MED ORDER — CIPROFLOXACIN HCL 250 MG PO TABS: 250.0000 mg | ORAL_TABLET | Freq: Two times a day (BID) | ORAL | Status: AC

## 2012-05-14 NOTE — Assessment & Plan Note (Signed)
Start cipro and check a urine culture

## 2012-05-14 NOTE — Assessment & Plan Note (Signed)
I will check her renal function and a UA

## 2012-05-14 NOTE — Assessment & Plan Note (Signed)
I will recheck her CMP today and will get an SPEP done as well to see if she has lymphoproliferative disease

## 2012-05-14 NOTE — Patient Instructions (Signed)
Urinary Tract Infection Urinary tract infections (UTIs) can develop anywhere along your urinary tract. Your urinary tract is your body's drainage system for removing wastes and extra water. Your urinary tract includes two kidneys, two ureters, a bladder, and a urethra. Your kidneys are a pair of bean-shaped organs. Each kidney is about the size of your fist. They are located below your ribs, one on each side of your spine. CAUSES Infections are caused by microbes, which are microscopic organisms, including fungi, viruses, and bacteria. These organisms are so small that they can only be seen through a microscope. Bacteria are the microbes that most commonly cause UTIs. SYMPTOMS  Symptoms of UTIs may vary by age and gender of the patient and by the location of the infection. Symptoms in young women typically include a frequent and intense urge to urinate and a painful, burning feeling in the bladder or urethra during urination. Older women and men are more likely to be tired, shaky, and weak and have muscle aches and abdominal pain. A fever may mean the infection is in your kidneys. Other symptoms of a kidney infection include pain in your back or sides below the ribs, nausea, and vomiting. DIAGNOSIS To diagnose a UTI, your caregiver will ask you about your symptoms. Your caregiver also will ask to provide a urine sample. The urine sample will be tested for bacteria and white blood cells. White blood cells are made by your body to help fight infection. TREATMENT  Typically, UTIs can be treated with medication. Because most UTIs are caused by a bacterial infection, they usually can be treated with the use of antibiotics. The choice of antibiotic and length of treatment depend on your symptoms and the type of bacteria causing your infection. HOME CARE INSTRUCTIONS  If you were prescribed antibiotics, take them exactly as your caregiver instructs you. Finish the medication even if you feel better after you  have only taken some of the medication.  Drink enough water and fluids to keep your urine clear or pale yellow.  Avoid caffeine, tea, and carbonated beverages. They tend to irritate your bladder.  Empty your bladder often. Avoid holding urine for long periods of time.  Empty your bladder before and after sexual intercourse.  After a bowel movement, women should cleanse from front to back. Use each tissue only once. SEEK MEDICAL CARE IF:   You have back pain.  You develop a fever.  Your symptoms do not begin to resolve within 3 days. SEEK IMMEDIATE MEDICAL CARE IF:   You have severe back pain or lower abdominal pain.  You develop chills.  You have nausea or vomiting.  You have continued burning or discomfort with urination. MAKE SURE YOU:   Understand these instructions.  Will watch your condition.  Will get help right away if you are not doing well or get worse. Document Released: 04/30/2005 Document Revised: 01/20/2012 Document Reviewed: 08/29/2011 [employer] Patient Information 2013 [employer].  

## 2012-05-14 NOTE — Progress Notes (Signed)
Subjective:    Patient ID: Holly Simmons, female    DOB: August 12, 1940, 71 y.o.   MRN: [id number]  Hematuria This is a new problem. Episode onset: 2 days ago. The problem has been resolved since onset. She describes the hematuria as gross hematuria. The hematuria occurs during the terminal portion of her urinary stream. She reports no clotting in her urine stream. Her pain is at a severity of 0/10. She is experiencing no pain. She describes her urine color as dark red. Irritative symptoms do not include frequency, nocturia or urgency. Obstructive symptoms do not include dribbling, incomplete emptying, an intermittent stream, a slower stream, straining or a weak stream. Pertinent negatives include no abdominal pain, chills, dysuria, facial swelling, fever, flank pain, genital pain, hesitancy, inability to urinate, nausea or vomiting. She is not sexually active.      Review of Systems  Constitutional: Negative for fever, chills, diaphoresis, activity change, appetite change, fatigue and unexpected weight change.  HENT: Negative for facial swelling.   Eyes: Negative.   Respiratory: Negative for cough, chest tightness, shortness of breath, wheezing and stridor.   Cardiovascular: Negative for chest pain, palpitations and leg swelling.  Gastrointestinal: Negative for nausea, vomiting, abdominal pain and diarrhea.  Genitourinary: Positive for hematuria. Negative for dysuria, hesitancy, urgency, frequency, flank pain, decreased urine volume, vaginal bleeding, vaginal discharge, enuresis, difficulty urinating, genital sores, vaginal pain, menstrual problem, pelvic pain, dyspareunia, incomplete emptying and nocturia.  Musculoskeletal: Negative.   Skin: Negative.   Neurological: Negative.   Hematological: Negative for adenopathy. Does not bruise/bleed easily.  Psychiatric/Behavioral: Negative.  Negative for agitation.       Objective:   Physical Exam  Vitals reviewed. Constitutional: She is  oriented to person, place, and time. She appears well-developed and well-nourished.  Non-toxic appearance. She does not have a sickly appearance. She does not appear ill. No distress.  HENT:  Head: Normocephalic and atraumatic.  Mouth/Throat: Oropharynx is clear and moist. No oropharyngeal exudate.  Eyes: Conjunctivae normal are normal. Right eye exhibits no discharge. Left eye exhibits no discharge. No scleral icterus.  Neck: Normal range of motion. Neck supple. No JVD present. No tracheal deviation present. No thyromegaly present.  Cardiovascular: Normal rate, regular rhythm, normal heart sounds and intact distal pulses.  Exam reveals no gallop and no friction rub.   No murmur heard. Pulmonary/Chest: Effort normal and breath sounds normal. No stridor. No respiratory distress. She has no wheezes. She has no rales. She exhibits no tenderness.  Abdominal: Soft. Bowel sounds are normal. She exhibits no distension and no mass. There is no hepatosplenomegaly. There is no tenderness. There is no rebound, no guarding and no CVA tenderness.  Musculoskeletal: Normal range of motion. She exhibits no edema and no tenderness.  Lymphadenopathy:    She has no cervical adenopathy.  Neurological: She is oriented to person, place, and time.  Skin: Skin is warm and dry. No rash noted. She is not diaphoretic. No erythema. No pallor.  Psychiatric: She has a normal mood and affect. Her behavior is normal. Judgment and thought content normal.     Lab Results  Component Value Date   WBC 5.6 12/04/2011   HGB 11.8* 12/04/2011   HCT 35.3* 12/04/2011   PLT 149.0* 12/04/2011   GLUCOSE 123* 05/12/2012   CHOL 158 05/12/2012   TRIG 97.0 05/12/2012   HDL 45.70 05/12/2012   LDLCALC 93 05/12/2012   ALT 14 05/12/2012   AST 21 05/12/2012   NA 140 05/12/2012   K 4.2  05/12/2012   CL 100 05/12/2012   CREATININE 1.4* 05/12/2012   BUN 22 05/12/2012   CO2 32 05/12/2012   TSH 1.66 12/04/2011   INR 1.9 09/15/2011   HGBA1C 6.7* 05/12/2012    Urine dipstick shows positive for WBC's, positive for RBC's and positive for protein.  Micro exam: not done.    Assessment & Plan:

## 2012-05-16 LAB — CULTURE, URINE COMPREHENSIVE: Colony Count: 60000

## 2012-05-18 LAB — PROTEIN ELECTROPHORESIS, SERUM, WITH REFLEX
Alpha-2-Globulin: 10.4 % (ref 7.1–11.8)
Beta 2: 4.4 % (ref 3.2–6.5)
Beta Globulin: 5 % (ref 4.7–7.2)

## 2012-06-07 ENCOUNTER — Other Ambulatory Visit: Payer: Self-pay | Admitting: Internal Medicine

## 2012-06-10 ENCOUNTER — Encounter: Payer: Self-pay | Admitting: *Deleted

## 2012-06-10 DIAGNOSIS — Z950 Presence of cardiac pacemaker: Secondary | ICD-10-CM | POA: Insufficient documentation

## 2012-06-16 ENCOUNTER — Ambulatory Visit (INDEPENDENT_AMBULATORY_CARE_PROVIDER_SITE_OTHER): Payer: Medicare Other | Admitting: Internal Medicine

## 2012-06-16 ENCOUNTER — Encounter: Payer: Self-pay | Admitting: Internal Medicine

## 2012-06-16 VITALS — BP 106/62 | HR 77 | Ht 63.0 in | Wt 190.0 lb

## 2012-06-16 DIAGNOSIS — I4891 Unspecified atrial fibrillation: Secondary | ICD-10-CM

## 2012-06-16 DIAGNOSIS — I421 Obstructive hypertrophic cardiomyopathy: Secondary | ICD-10-CM

## 2012-06-16 DIAGNOSIS — I5032 Chronic diastolic (congestive) heart failure: Secondary | ICD-10-CM

## 2012-06-16 DIAGNOSIS — I442 Atrioventricular block, complete: Secondary | ICD-10-CM

## 2012-06-17 ENCOUNTER — Encounter: Payer: Self-pay | Admitting: *Deleted

## 2012-06-21 NOTE — Assessment & Plan Note (Signed)
Stable No change required today  

## 2012-06-21 NOTE — Assessment & Plan Note (Signed)
Maintaining sinus rhythm No changes

## 2012-06-21 NOTE — Assessment & Plan Note (Signed)
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 See Claudia Desanctis Art report No changes today  I spoke at length with the patient today about her elevated RV lead threshold. She would like to proceed with transvenous pacemaker implantation.  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 I have spoken with Dr Roxy Manns who feels that placement of a transvenous lead through the tissue tricuspid valve is a very reasonable option.  He does not feel that it would be necessary to place the lead into the CS. Risks, benefits, alternatives to pacemaker implantation were discussed in detail with the patient today. The patient understands that the risks include but are not limited to bleeding, infection, pneumothorax, perforation, tamponade, vascular damage, renal failure, MI, stroke, death,  and lead dislodgement and wishes to proceed. We will therefore schedule the procedure at the next available time.

## 2012-06-21 NOTE — Progress Notes (Signed)
PCP:  Scarlette Calico, MD Primary Cardiologist:  Dr Johnsie Cancel  The patient presents today for routine electrophysiology followup.  Since last being seen in our clinic, the patient reports doing very well.   Today, she denies symptoms of palpitations, chest pain, shortness of breath, orthopnea, PND, lower extremity edema, dizziness, presyncope, or syncope.  The patient feels that she is tolerating medications without difficulties and is otherwise without complaint today.   Past Medical History  Diagnosis Date  . Hypertension   . Hyperpotassemia   . Asthma     NOS w/ acute exacerbation  . Hypersomnia   . Depressive disorder   . Diastolic dysfunction   . Pyuria   . Renal insufficiency   . GERD (gastroesophageal reflux disease)   . COPD (chronic obstructive pulmonary disease)   . Obstructive sleep apnea     persistent daytime sleepiness despite cpap  . Allergic rhinitis   . Renal insufficiency   . Psoriasis   . Hypertrophic cardiomyopathy     s/p surgical myomectomy at [facility] 0000000 complicated by septal VSD post procedure requiring reoperation with patch closure and tricuspid valve replacement  . 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 Complete heart block     requiring PPM (MDT) post surgical myomectomy at [facility],  leads are epicardial with abdominal implant, high ventricular threshold at implant   Past Surgical History  Procedure Date  . Appendectomy   . Cholecystectomy   . Tonsillectomy   . Cesarean section   . Abdominal hysterectomy   . Septal myomectomy for hypertrophic cm 06/23/11    by Dr Evelina Dun at [facility], complicated by septal VSD requiring patch repair and tricuspid valve replacement  . 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 Vsd repair 06/2011  . Pacemaker insertion 07/02/11    epicardial wires with abdominal implant at [place] 12/12,  high ventricular lead threshold at implant per Dr Westley Gambles    Current Outpatient Prescriptions    Medication Sig Dispense Refill  . amoxicillin (AMOXIL) 500 MG capsule Take 4 tabs  1  Hour prior to dental visit./cy  4 capsule  2  . Armodafinil (NUVIGIL) 150 MG tablet Take 1 tablet (150 mg total) by mouth as needed.  30 tablet  5  . aspirin 81 MG tablet Take 81 mg by mouth daily.        . Blood Glucose Monitoring Suppl (ONE TOUCH ULTRA MINI) W/DEVICE KIT Test twice daily      . budesonide-formoterol (SYMBICORT) 160-4.5 MCG/ACT inhaler Inhale 2 puffs into the lungs 2 (two) times daily.        Marland Kitchen buPROPion (WELLBUTRIN XL) 150 MG 24 hr tablet Take 1 tablet (150 mg total) by mouth daily.  90 tablet  3  . calcipotriene (DOVONOX) 0.005 % cream Apply topically 2 (two) times daily.      . fexofenadine (ALLEGRA) 180 MG tablet Take 180 mg by mouth daily. prn       . furosemide (LASIX) 40 MG tablet One by mouth every other day  30 tablet  11  . glucose blood (ONE TOUCH ULTRA TEST) test strip Test twice daily  100 each  5  . L-Methylfolate-B6-B12 (METANX) 3-35-2 MG TABS Take 1 tablet by mouth 2 (two) times daily.        . LUXIQ 0.12 % foam APPLY TO SCALP AS DIRECTED  100 g  11  . metoprolol (TOPROL-XL) 100 MG 24 hr tablet Take  1 tablet (100 mg total) by mouth daily.  30 tablet  11  . Multiple Vitamin (MULTIVITAMIN) tablet Take 1 tablet by mouth daily.      Glory Rosebush DELICA LANCETS MISC Test two times daily  100 each  5  . ranitidine (ZANTAC) 300 MG tablet Take 1 tablet (300 mg total) by mouth at bedtime.  90 tablet  3  . spironolactone (ALDACTONE) 25 MG tablet Take 12.5 mg by mouth daily.      . Vilazodone HCl (VIIBRYD) 40 MG TABS Take 1 tablet by mouth daily.  140 tablet  0  . zaleplon (SONATA) 10 MG capsule Take 10 mg by mouth at bedtime as needed.      . zaleplon (SONATA) 10 MG capsule Take 10 mg by mouth as needed.      . [DISCONTINUED] Alum & Mag Hydroxide-Simeth (MAGIC MOUTHWASH) SOLN Take 5 mLs by mouth 3 (three) times daily. 14 days then stop         Allergies  Allergen Reactions  .  Tetanus Toxoids Swelling  . Nsaids     REACTION: proteinuria  . Sulfonamide Derivatives     History   Social History  . Marital Status: Married    Spouse Name: [name]    Number of Children: 2  . Years of Education: N/A   Occupational History  .     Social History Main Topics  . Smoking status: Never Smoker   . Smokeless tobacco: Never Used  . Alcohol Use: No  . Drug Use: No  . Sexually Active: Not Currently   Other Topics Concern  . Not on file   Social History Narrative  . No narrative on file    Family History  Problem Relation Age of Onset  . Cancer Father     bladder  . Cancer Maternal Aunt     ovarian  . Heart disease Maternal Grandfather   . Heart disease Paternal Grandfather   . Cancer Other     breast  . Dementia Other   . Hypertension Other    Physical Exam: Filed Vitals:   06/16/12 1558  BP: 106/62  Pulse: 77  Height: 5\' 3"  (1.6 m)  Weight: 190 lb (86.183 kg)  SpO2: 98%    GEN- The patient is overweight appearing, alert and oriented x 3 today.   Head- normocephalic, atraumatic Eyes-  Sclera clear, conjunctiva pink Ears- hearing intact Oropharynx- clear Neck- supple, Lungs- Clear to ausculation bilaterally, normal work of breathing Abd- pacemaker pocket is well healed Heart- Regular rate and rhythm, 2/6 SEM LUSB, 1/6 diastolic murmur LUSB GI- soft, NT, ND, + BS Extremities- no clubbing, cyanosis, or edema  ekg today reveals AV sequential pacing  Pacemaker interrogation- reviewed in detail today,  See PACEART report  Assessment and Plan:

## 2012-06-29 ENCOUNTER — Telehealth: Payer: Self-pay | Admitting: Internal Medicine

## 2012-06-29 NOTE — Telephone Encounter (Signed)
Lab appointment on 07/06/12 at 10:00

## 2012-06-29 NOTE — Telephone Encounter (Signed)
Pt calling re upcoming surgery and not heard anything from the hospital , does pt need labs scheduled for 12-3 for surgery?  pls call

## 2012-06-30 ENCOUNTER — Encounter (HOSPITAL_COMMUNITY): Payer: Self-pay | Admitting: Pharmacy Technician

## 2012-07-06 ENCOUNTER — Other Ambulatory Visit (INDEPENDENT_AMBULATORY_CARE_PROVIDER_SITE_OTHER): Payer: Medicare Other

## 2012-07-06 DIAGNOSIS — I421 Obstructive hypertrophic cardiomyopathy: Secondary | ICD-10-CM

## 2012-07-06 DIAGNOSIS — I4891 Unspecified atrial fibrillation: Secondary | ICD-10-CM

## 2012-07-06 LAB — BASIC METABOLIC PANEL
BUN: 26 mg/dL — ABNORMAL HIGH (ref 6–23)
Calcium: 9.8 mg/dL (ref 8.4–10.5)
Creatinine, Ser: 1.4 mg/dL — ABNORMAL HIGH (ref 0.4–1.2)
GFR: 40.64 mL/min — ABNORMAL LOW (ref >60.00)
Glucose, Bld: 176 mg/dL — ABNORMAL HIGH (ref 70–99)
Potassium: 3.7 mEq/L (ref 3.5–5.1)

## 2012-07-06 LAB — CBC WITH DIFFERENTIAL/PLATELET
Basophils Absolute: 0 10*3/uL (ref 0.0–0.1)
Eosinophils Relative: 3.2 % (ref 0.0–5.0)
Hemoglobin: 13.2 g/dL (ref 12.0–15.0)
Lymphocytes Relative: 18.4 % (ref 12.0–46.0)
Monocytes Relative: 7.2 % (ref 3.0–12.0)
Platelets: 163 10*3/uL (ref 150.0–400.0)
RDW: 14.1 % (ref 11.5–14.6)
WBC: 6.7 10*3/uL (ref 4.5–10.5)

## 2012-07-07 ENCOUNTER — Ambulatory Visit (INDEPENDENT_AMBULATORY_CARE_PROVIDER_SITE_OTHER): Payer: Medicare Other | Admitting: Internal Medicine

## 2012-07-07 ENCOUNTER — Encounter: Payer: Self-pay | Admitting: Internal Medicine

## 2012-07-07 VITALS — BP 122/78 | HR 84 | Temp 97.0°F | Resp 18 | Ht 63.0 in | Wt 192.0 lb

## 2012-07-07 DIAGNOSIS — I10 Essential (primary) hypertension: Secondary | ICD-10-CM

## 2012-07-07 DIAGNOSIS — L309 Dermatitis, unspecified: Secondary | ICD-10-CM | POA: Insufficient documentation

## 2012-07-07 DIAGNOSIS — L259 Unspecified contact dermatitis, unspecified cause: Secondary | ICD-10-CM

## 2012-07-07 MED ORDER — SPIRONOLACTONE 25 MG PO TABS: 12.5000 mg | ORAL_TABLET | Freq: Every day | ORAL | Status: DC

## 2012-07-07 MED ORDER — METOPROLOL SUCCINATE ER 100 MG PO TB24: 100.0000 mg | ORAL_TABLET | Freq: Every day | ORAL | Status: DC

## 2012-07-07 MED ORDER — TRIAMCINOLONE ACETONIDE 0.5 % EX CREA: TOPICAL_CREAM | Freq: Three times a day (TID) | CUTANEOUS | Status: DC

## 2012-07-07 NOTE — Patient Instructions (Signed)
Eczema Atopic dermatitis, or eczema, is an inherited type of sensitive skin. Often people with eczema have a family history of allergies, asthma, or hay fever. It causes a red itchy rash and dry scaly skin. The itchiness may occur before the skin rash and may be very intense. It is not contagious. Eczema is generally worse during the cooler winter months and often improves with the warmth of summer. Eczema usually starts showing signs in infancy. Some children outgrow eczema, but it may last through adulthood. Flare-ups may be caused by:  Eating something or contact with something you are sensitive or allergic to.  Stress. DIAGNOSIS  The diagnosis of eczema is usually based upon symptoms and medical history. TREATMENT  Eczema cannot be cured, but symptoms usually can be controlled with treatment or avoidance of allergens (things to which you are sensitive or allergic to).  Controlling the itching and scratching.  Use over-the-counter antihistamines as directed for itching. It is especially useful at night when the itching tends to be worse.  Use over-the-counter steroid creams as directed for itching.  Scratching makes the rash and itching worse and may cause impetigo (a skin infection) if fingernails are contaminated (dirty).  Keeping the skin well moisturized with creams every day. This will seal in moisture and help prevent dryness. Lotions containing alcohol and water can dry the skin and are not recommended.  Limiting exposure to allergens.  Recognizing situations that cause stress.  Developing a plan to manage stress. HOME CARE INSTRUCTIONS   Take prescription and over-the-counter medicines as directed by your caregiver.  Do not use anything on the skin without checking with your caregiver.  Keep baths or showers short (5 minutes) in warm (not hot) water. Use mild cleansers for bathing. You may add non-perfumed bath oil to the bath water. It is best to avoid soap and bubble  bath.  Immediately after a bath or shower, when the skin is still damp, apply a moisturizing ointment to the entire body. This ointment should be a petroleum ointment. This will seal in moisture and help prevent dryness. The thicker the ointment the better. These should be unscented.  Keep fingernails cut short and wash hands often. If your child has eczema, it may be necessary to put soft gloves or mittens on your child at night.  Dress in clothes made of cotton or cotton blends. Dress lightly, as heat increases itching.  Avoid foods that may cause flare-ups. Common foods include cow's milk, peanut butter, eggs and wheat.  Keep a child with eczema away from anyone with fever blisters. The virus that causes fever blisters (herpes simplex) can cause a serious skin infection in children with eczema. SEEK MEDICAL CARE IF:   Itching interferes with sleep.  The rash gets worse or is not better within one week following treatment.  The rash looks infected (pus or soft yellow scabs).  You or your child has an oral temperature above 102 F (38.9 C).  Your baby is older than 3 months with a rectal temperature of 100.5 F (38.1 C) or higher for more than 1 day.  The rash flares up after contact with someone who has fever blisters. SEEK IMMEDIATE MEDICAL CARE IF:   Your baby is older than 3 months with a rectal temperature of 102 F (38.9 C) or higher.  Your baby is older than 3 months or younger with a rectal temperature of 100.4 F (38 C) or higher. Document Released: 07/18/2000 Document Revised: 10/13/2011 Document Reviewed: 05/23/2009 ExitCare  Patient Information 2013 [place].

## 2012-07-07 NOTE — Assessment & Plan Note (Signed)
Her BP is well controlled 

## 2012-07-07 NOTE — Progress Notes (Signed)
Subjective:    Patient ID: Holly Simmons, female    DOB: Nov 13, 1940, 71 y.o.   MRN: [id number]  Rash This is a chronic problem. The current episode started more than 1 year ago. The problem has been gradually worsening since onset. The affected locations include the groin. The rash is characterized by itchiness and dryness. She was exposed to nothing. Pertinent negatives include no anorexia, congestion, cough, diarrhea, facial edema, fatigue, fever, nail changes, rhinorrhea, shortness of breath, sore throat or vomiting. Past treatments include nothing. The treatment provided no relief. Her past medical history is significant for eczema.      Review of Systems  Constitutional: Negative for fever, chills, diaphoresis, activity change, appetite change, fatigue and unexpected weight change.  HENT: Negative.  Negative for congestion, sore throat and rhinorrhea.   Eyes: Negative.   Respiratory: Negative for cough, shortness of breath, wheezing and stridor.   Cardiovascular: Negative for chest pain, palpitations and leg swelling.  Gastrointestinal: Negative.  Negative for vomiting, diarrhea and anorexia.  Genitourinary: Negative.   Musculoskeletal: Negative for myalgias, back pain, joint swelling, arthralgias and gait problem.  Skin: Positive for rash. Negative for nail changes, color change, pallor and wound.  Neurological: Negative.   Hematological: Negative for adenopathy. Does not bruise/bleed easily.  Psychiatric/Behavioral: Negative.        Objective:   Physical Exam  Constitutional: She is oriented to person, place, and time. She appears well-developed and well-nourished. No distress.  HENT:  Head: Normocephalic and atraumatic.  Mouth/Throat: Oropharynx is clear and moist. No oropharyngeal exudate.  Eyes: Conjunctivae normal are normal. Right eye exhibits no discharge. Left eye exhibits no discharge. No scleral icterus.  Neck: Normal range of motion. Neck supple. No JVD  present. No tracheal deviation present. No thyromegaly present.  Cardiovascular: Normal rate, regular rhythm, S1 normal, S2 normal, intact distal pulses and normal pulses.  Exam reveals no gallop and no friction rub.   Murmur heard.  Systolic murmur is present with a grade of 1/6   Diastolic murmur is present with a grade of 1/6  Pulmonary/Chest: Effort normal and breath sounds normal. No stridor. No respiratory distress. She has no wheezes. She has no rales. She exhibits no tenderness.  Abdominal: Soft. Bowel sounds are normal. She exhibits no distension and no mass. There is no tenderness. There is no rebound and no guarding.  Musculoskeletal: Normal range of motion. She exhibits no edema and no tenderness.  Lymphadenopathy:    She has no cervical adenopathy.  Neurological: She is oriented to person, place, and time.  Skin: Skin is warm, dry and intact. Rash noted. No abrasion, no bruising, no burn, no ecchymosis, no laceration, no lesion, no petechiae and no purpura noted. Rash is not macular, not papular, not maculopapular, not nodular, not pustular, not vesicular and not urticarial. She is not diaphoretic. There is erythema. No pallor.          In her perineum there is diffuse erythema and scale but no plaques, excoriations, vesicles, papules  Psychiatric: She has a normal mood and affect. Her behavior is normal. Judgment and thought content normal.      Lab Results  Component Value Date   WBC 6.7 07/06/2012   HGB 13.2 07/06/2012   HCT 40.6 07/06/2012   PLT 163.0 07/06/2012   GLUCOSE 176* 07/06/2012   CHOL 158 05/12/2012   TRIG 97.0 05/12/2012   HDL 45.70 05/12/2012   LDLCALC 93 05/12/2012   ALT 16 05/14/2012   AST 20 05/14/2012  NA 138 07/06/2012   K 3.7 07/06/2012   CL 100 07/06/2012   CREATININE 1.4* 07/06/2012   BUN 26* 07/06/2012   CO2 29 07/06/2012   TSH 1.66 12/04/2011   INR 1.9 09/15/2011   HGBA1C 6.7* 05/12/2012      Assessment & Plan:

## 2012-07-07 NOTE — Assessment & Plan Note (Signed)
Treat with TAC cream

## 2012-07-08 ENCOUNTER — Ambulatory Visit (HOSPITAL_COMMUNITY): Payer: Medicare Other | Attending: Cardiology | Admitting: Radiology

## 2012-07-08 ENCOUNTER — Other Ambulatory Visit (HOSPITAL_COMMUNITY): Payer: Self-pay | Admitting: Radiology

## 2012-07-08 DIAGNOSIS — R011 Cardiac murmur, unspecified: Secondary | ICD-10-CM | POA: Insufficient documentation

## 2012-07-08 DIAGNOSIS — I517 Cardiomegaly: Secondary | ICD-10-CM | POA: Insufficient documentation

## 2012-07-08 DIAGNOSIS — Z954 Presence of other heart-valve replacement: Secondary | ICD-10-CM | POA: Insufficient documentation

## 2012-07-08 DIAGNOSIS — I10 Essential (primary) hypertension: Secondary | ICD-10-CM | POA: Insufficient documentation

## 2012-07-08 NOTE — Progress Notes (Signed)
Echocardiogram performed.  

## 2012-07-10 ENCOUNTER — Ambulatory Visit (INDEPENDENT_AMBULATORY_CARE_PROVIDER_SITE_OTHER): Payer: Medicare Other | Admitting: Family

## 2012-07-10 ENCOUNTER — Encounter: Payer: Self-pay | Admitting: Family

## 2012-07-10 VITALS — BP 122/76 | HR 88 | Temp 96.8°F | Resp 15 | Wt 194.2 lb

## 2012-07-10 DIAGNOSIS — M79672 Pain in left foot: Secondary | ICD-10-CM

## 2012-07-10 DIAGNOSIS — M79609 Pain in unspecified limb: Secondary | ICD-10-CM

## 2012-07-10 MED ORDER — METHYLPREDNISOLONE ACETATE 40 MG/ML IJ SUSP
80.0000 mg | Freq: Once | INTRAMUSCULAR | Status: AC
  Administered 2012-07-10: 80 mg via INTRAMUSCULAR

## 2012-07-10 NOTE — Progress Notes (Signed)
Subjective:    Patient ID: Holly Simmons, female    DOB: 1941-02-27, 71 y.o.   MRN: [id number]  HPI  71 year old white female, nonsmoker, is in today with c/o left foot pain x 2 days. No injury.  The began yesterday and worsened today. It is exquisitely tender to touch, red, and swollen. She has history of Type 2 Diabetes. Has eaten more shrimp this week than usual. No alcohol. Rates pain 8/10, worse to touch.   Scheduled to have a "pacemaker changed" Tuesday.   Review of Systems  Constitutional: Negative.  Negative for fever.  Respiratory: Negative.   Cardiovascular: Negative.   Musculoskeletal: Negative.        Left foot pain. Swollen and tender to touch.   Skin: Negative.   Neurological: Negative.   Hematological: Negative.   Psychiatric/Behavioral: Negative.    Past Medical History  Diagnosis Date  . Hypertension   . Hyperpotassemia   . Asthma     NOS w/ acute exacerbation  . Hypersomnia   . Depressive disorder   . Diastolic dysfunction   . Pyuria   . Renal insufficiency   . GERD (gastroesophageal reflux disease)   . COPD (chronic obstructive pulmonary disease)   . Obstructive sleep apnea     persistent daytime sleepiness despite cpap  . Allergic rhinitis   . Renal insufficiency   . Psoriasis   . Hypertrophic cardiomyopathy     s/p surgical myomectomy at [facility] 0000000 complicated by septal VSD post procedure requiring reoperation with patch closure and tricuspid valve replacement  . Tricuspid valve replaced     MDT 40mm Mosaic Valve  . Complete heart block     requiring PPM (MDT) post surgical myomectomy at [facility],  leads are epicardial with abdominal implant, high ventricular threshold at implant    History   Social History  . Marital Status: Married    Spouse Name: [name]    Number of Children: 2  . Years of Education: N/A   Occupational History  .     Social History Main Topics  . Smoking status: Never Smoker   . Smokeless tobacco: Never Used  .  Alcohol Use: No  . Drug Use: No  . Sexually Active: Not Currently   Other Topics Concern  . Not on file   Social History Narrative  . No narrative on file    Past Surgical History  Procedure Date  . Appendectomy   . Cholecystectomy   . Tonsillectomy   . Cesarean section   . Abdominal hysterectomy   . Septal myomectomy for hypertrophic cm 06/23/11    by Dr Evelina Dun at [facility], complicated by septal VSD requiring patch repair and tricuspid valve replacement  . Tricuspid valve replacement 11/12    Medtronic 63mm Mosaic tissue valve  . Vsd repair 06/2011  . Pacemaker insertion 07/02/11    epicardial wires with abdominal implant at [place] 12/12,  high ventricular lead threshold at implant per Dr Westley Gambles    Family History  Problem Relation Age of Onset  . Cancer Father     bladder  . Cancer Maternal Aunt     ovarian  . Heart disease Maternal Grandfather   . Heart disease Paternal Grandfather   . Cancer Other     breast  . Dementia Other   . Hypertension Other     Allergies  Allergen Reactions  . Sulfonamide Derivatives Anaphylaxis and Swelling  . Tetanus Toxoids Swelling  . Nsaids     REACTION: proteinuria  Current Outpatient Prescriptions on File Prior to Visit  Medication Sig Dispense Refill  . amoxicillin (AMOXIL) 500 MG capsule Take 4 tabs  1  Hour prior to dental visit./cy  4 capsule  2  . Armodafinil (NUVIGIL) 150 MG tablet Take 1 tablet (150 mg total) by mouth as needed.  30 tablet  5  . aspirin 81 MG tablet Take 81 mg by mouth daily.        . Blood Glucose Monitoring Suppl (ONE TOUCH ULTRA MINI) W/DEVICE KIT Test twice daily      . budesonide-formoterol (SYMBICORT) 160-4.5 MCG/ACT inhaler Inhale 2 puffs into the lungs 2 (two) times daily.        Marland Kitchen buPROPion (WELLBUTRIN XL) 150 MG 24 hr tablet Take 1 tablet (150 mg total) by mouth daily.  90 tablet  3  . fexofenadine (ALLEGRA) 180 MG tablet Take 180 mg by mouth daily. prn       . furosemide (LASIX) 40 MG tablet  One by mouth every other day  30 tablet  11  . glucose blood (ONE TOUCH ULTRA TEST) test strip Test twice daily  100 each  5  . L-Methylfolate-B6-B12 (METANX) 3-35-2 MG TABS Take 1 tablet by mouth 2 (two) times daily.        . LUXIQ 0.12 % foam APPLY TO SCALP AS DIRECTED  100 g  11  . metoprolol succinate (TOPROL-XL) 100 MG 24 hr tablet Take 1 tablet (100 mg total) by mouth daily.  90 tablet  3  . Multiple Vitamin (MULTIVITAMIN) tablet Take 1 tablet by mouth daily.      Glory Rosebush DELICA LANCETS MISC Test two times daily  100 each  5  . ranitidine (ZANTAC) 300 MG tablet Take 1 tablet (300 mg total) by mouth at bedtime.  90 tablet  3  . spironolactone (ALDACTONE) 25 MG tablet Take 0.5 tablets (12.5 mg total) by mouth daily.  90 tablet  3  . triamcinolone cream (KENALOG) 0.5 % Apply topically 3 (three) times daily.  30 g  3  . Vilazodone HCl (VIIBRYD) 40 MG TABS Take 1 tablet by mouth daily.  140 tablet  0  . [DISCONTINUED] Alum & Mag Hydroxide-Simeth (MAGIC MOUTHWASH) SOLN Take 5 mLs by mouth 3 (three) times daily. 14 days then stop        No current facility-administered medications on file prior to visit.    BP 122/76  Pulse 88  Temp 96.8 F (36 C) (Oral)  Resp 15  Wt 194 lb 4 oz (88.111 kg)  SpO2 96%chart    Objective:   Physical Exam  Constitutional: She is oriented to person, place, and time. She appears well-developed and well-nourished.  Cardiovascular: Normal rate, regular rhythm and normal heart sounds.   Pulmonary/Chest: Effort normal and breath sounds normal.  Musculoskeletal: She exhibits tenderness.       Pain with flexion, extension, eversion and inversion. Left medial foot swollen and very tender to touch, even of the skin.   Neurological: She is alert and oriented to person, place, and time.  Skin: Skin is warm and dry.  Psychiatric: She has a normal mood and affect.          Assessment & Plan:  Assessment: Possible-Gout, Left foot pain  Plan: Depo Medrol  80mg  IM x 1. Call the office if symptoms worsen or persist. Recheck Monday if no better and consider xray of the left foot. Patient is scheduled for a procedure on Tuesday. I will not add  any additional medication at this point.

## 2012-07-10 NOTE — Patient Instructions (Signed)
Gout Gout is an inflammatory condition (arthritis) caused by a buildup of uric acid crystals in the joints. Uric acid is a chemical that is normally present in the blood. Under some circumstances, uric acid can form into crystals in your joints. This causes joint redness, soreness, and swelling (inflammation). Repeat attacks are common. Over time, uric acid crystals can form into masses (tophi) near a joint, causing disfigurement. Gout is treatable and often preventable. CAUSES  The disease begins with elevated levels of uric acid in the blood. Uric acid is produced by your body when it breaks down a naturally found substance called purines. This also happens when you eat certain foods such as meats and fish. Causes of an elevated uric acid level include:  Being passed down from parent to child (heredity).  Diseases that cause increased uric acid production (obesity, psoriasis, some cancers).  Excessive alcohol use.  Diet, especially diets rich in meat and seafood.  Medicines, including certain cancer-fighting drugs (chemotherapy), diuretics, and aspirin.  Chronic kidney disease. The kidneys are no longer able to remove uric acid well.  Problems with metabolism. Conditions strongly associated with gout include:  Obesity.  High blood pressure.  High cholesterol.  Diabetes. Not everyone with elevated uric acid levels gets gout. It is not understood why some people get gout and others do not. Surgery, joint injury, and eating too much of certain foods are some of the factors that can lead to gout. SYMPTOMS   An attack of gout comes on quickly. It causes intense pain with redness, swelling, and warmth in a joint.  Fever can occur.  Often, only one joint is involved. Certain joints are more commonly involved:  Base of the big toe.  Knee.  Ankle.  Wrist.  Finger. Without treatment, an attack usually goes away in a few days to weeks. Between attacks, you usually will not have  symptoms, which is different from many other forms of arthritis. DIAGNOSIS  Your caregiver will suspect gout based on your symptoms and exam. Removal of fluid from the joint (arthrocentesis) is done to check for uric acid crystals. Your caregiver will give you a medicine that numbs the area (local anesthetic) and use a needle to remove joint fluid for exam. Gout is confirmed when uric acid crystals are seen in joint fluid, using a special microscope. Sometimes, blood, urine, and X-ray tests are also used. TREATMENT  There are 2 phases to gout treatment: treating the sudden onset (acute) attack and preventing attacks (prophylaxis). Treatment of an Acute Attack  Medicines are used. These include anti-inflammatory medicines or steroid medicines.  An injection of steroid medicine into the affected joint is sometimes necessary.  The painful joint is rested. Movement can worsen the arthritis.  You may use warm or cold treatments on painful joints, depending which works best for you.  Discuss the use of coffee, vitamin C, or cherries with your caregiver. These may be helpful treatment options. Treatment to Prevent Attacks After the acute attack subsides, your caregiver may advise prophylactic medicine. These medicines either help your kidneys eliminate uric acid from your body or decrease your uric acid production. You may need to stay on these medicines for a very long time. The early phase of treatment with prophylactic medicine can be associated with an increase in acute gout attacks. For this reason, during the first few months of treatment, your caregiver may also advise you to take medicines usually used for acute gout treatment. Be sure you understand your caregiver's directions.   You should also discuss dietary treatment with your caregiver. Certain foods such as meats and fish can increase uric acid levels. Other foods such as dairy can decrease levels. Your caregiver can give you a list of foods  to avoid. HOME CARE INSTRUCTIONS   Do not take aspirin to relieve pain. This raises uric acid levels.  Only take over-the-counter or prescription medicines for pain, discomfort, or fever as directed by your caregiver.  Rest the joint as much as possible. When in bed, keep sheets and blankets off painful areas.  Keep the affected joint raised (elevated).  Use crutches if the painful joint is in your leg.  Drink enough water and fluids to keep your urine clear or pale yellow. This helps your body get rid of uric acid. Do not drink alcoholic beverages. They slow the passage of uric acid.  Follow your caregiver's dietary instructions. Pay careful attention to the amount of protein you eat. Your daily diet should emphasize fruits, vegetables, whole grains, and fat-free or low-fat milk products.  Maintain a healthy body weight. SEEK MEDICAL CARE IF:   You have an oral temperature above 102 F (38.9 C).  You develop diarrhea, vomiting, or any side effects from medicines.  You do not feel better in 24 hours, or you are getting worse. SEEK IMMEDIATE MEDICAL CARE IF:   Your joint becomes suddenly more tender and you have:  Chills.  An oral temperature above 102 F (38.9 C), not controlled by medicine. MAKE SURE YOU:   Understand these instructions.  Will watch your condition.  Will get help right away if you are not doing well or get worse. Document Released: 07/18/2000 Document Revised: 10/13/2011 Document Reviewed: 10/29/2009 Exit[employer] Patient Information 2013 [employer].    

## 2012-07-12 MED ORDER — SODIUM CHLORIDE 0.9 % IR SOLN
80.0000 mg | Status: DC
  Filled 2012-07-12 (×2): qty 2

## 2012-07-12 MED ORDER — CEFAZOLIN SODIUM-DEXTROSE 2-3 GM-% IV SOLR
2.0000 g | INTRAVENOUS | Status: DC
  Filled 2012-07-12 (×2): qty 50

## 2012-07-13 ENCOUNTER — Ambulatory Visit (HOSPITAL_COMMUNITY)
Admission: RE | Admit: 2012-07-13 | Discharge: 2012-07-14 | Disposition: A | Discharge status: Home / Self Care | Payer: Medicare Other | Source: Ambulatory Visit | Attending: Internal Medicine | Admitting: Internal Medicine

## 2012-07-13 ENCOUNTER — Encounter (HOSPITAL_COMMUNITY)
Admission: RE | Disposition: A | Discharge status: Home / Self Care | Payer: Self-pay | Source: Ambulatory Visit | Attending: Internal Medicine

## 2012-07-13 ENCOUNTER — Encounter (HOSPITAL_COMMUNITY): Payer: Self-pay | Admitting: General Practice

## 2012-07-13 DIAGNOSIS — IMO0001 Reserved for inherently not codable concepts without codable children: Secondary | ICD-10-CM

## 2012-07-13 DIAGNOSIS — G4733 Obstructive sleep apnea (adult) (pediatric): Secondary | ICD-10-CM

## 2012-07-13 DIAGNOSIS — G4700 Insomnia, unspecified: Secondary | ICD-10-CM

## 2012-07-13 DIAGNOSIS — I071 Rheumatic tricuspid insufficiency: Secondary | ICD-10-CM

## 2012-07-13 DIAGNOSIS — J309 Allergic rhinitis, unspecified: Secondary | ICD-10-CM

## 2012-07-13 DIAGNOSIS — I442 Atrioventricular block, complete: Secondary | ICD-10-CM | POA: Diagnosis present

## 2012-07-13 DIAGNOSIS — Z7901 Long term (current) use of anticoagulants: Secondary | ICD-10-CM

## 2012-07-13 DIAGNOSIS — R778 Other specified abnormalities of plasma proteins: Secondary | ICD-10-CM

## 2012-07-13 DIAGNOSIS — L309 Dermatitis, unspecified: Secondary | ICD-10-CM

## 2012-07-13 DIAGNOSIS — I421 Obstructive hypertrophic cardiomyopathy: Secondary | ICD-10-CM

## 2012-07-13 DIAGNOSIS — I422 Other hypertrophic cardiomyopathy: Secondary | ICD-10-CM | POA: Diagnosis present

## 2012-07-13 DIAGNOSIS — I4891 Unspecified atrial fibrillation: Secondary | ICD-10-CM

## 2012-07-13 DIAGNOSIS — R0602 Shortness of breath: Secondary | ICD-10-CM

## 2012-07-13 DIAGNOSIS — Z950 Presence of cardiac pacemaker: Secondary | ICD-10-CM

## 2012-07-13 DIAGNOSIS — F3289 Other specified depressive episodes: Secondary | ICD-10-CM

## 2012-07-13 DIAGNOSIS — F329 Major depressive disorder, single episode, unspecified: Secondary | ICD-10-CM

## 2012-07-13 DIAGNOSIS — J449 Chronic obstructive pulmonary disease, unspecified: Secondary | ICD-10-CM

## 2012-07-13 DIAGNOSIS — Z1231 Encounter for screening mammogram for malignant neoplasm of breast: Secondary | ICD-10-CM

## 2012-07-13 DIAGNOSIS — N390 Urinary tract infection, site not specified: Secondary | ICD-10-CM

## 2012-07-13 DIAGNOSIS — E7800 Pure hypercholesterolemia, unspecified: Secondary | ICD-10-CM

## 2012-07-13 DIAGNOSIS — G4710 Hypersomnia, unspecified: Secondary | ICD-10-CM

## 2012-07-13 DIAGNOSIS — J4489 Other specified chronic obstructive pulmonary disease: Secondary | ICD-10-CM

## 2012-07-13 DIAGNOSIS — I10 Essential (primary) hypertension: Secondary | ICD-10-CM

## 2012-07-13 DIAGNOSIS — M8580 Other specified disorders of bone density and structure, unspecified site: Secondary | ICD-10-CM

## 2012-07-13 DIAGNOSIS — Z79899 Other long term (current) drug therapy: Secondary | ICD-10-CM | POA: Insufficient documentation

## 2012-07-13 DIAGNOSIS — I509 Heart failure, unspecified: Secondary | ICD-10-CM

## 2012-07-13 DIAGNOSIS — I5032 Chronic diastolic (congestive) heart failure: Secondary | ICD-10-CM

## 2012-07-13 DIAGNOSIS — K219 Gastro-esophageal reflux disease without esophagitis: Secondary | ICD-10-CM

## 2012-07-13 DIAGNOSIS — Q210 Ventricular septal defect: Secondary | ICD-10-CM

## 2012-07-13 DIAGNOSIS — Z0000 Encounter for general adult medical examination without abnormal findings: Secondary | ICD-10-CM

## 2012-07-13 DIAGNOSIS — N259 Disorder resulting from impaired renal tubular function, unspecified: Secondary | ICD-10-CM

## 2012-07-13 HISTORY — DX: Presence of cardiac pacemaker: Z95.0

## 2012-07-13 HISTORY — PX: INSERT / REPLACE / REMOVE PACEMAKER: SUR710

## 2012-07-13 HISTORY — PX: PERMANENT PACEMAKER INSERTION: SHX5480

## 2012-07-13 HISTORY — DX: Acute myocardial infarction, unspecified: I21.9

## 2012-07-13 LAB — GLUCOSE, CAPILLARY: Glucose-Capillary: 118 mg/dL — ABNORMAL HIGH (ref 70–99)

## 2012-07-13 LAB — SURGICAL PCR SCREEN: Staphylococcus aureus: POSITIVE — AB

## 2012-07-13 SURGERY — PERMANENT PACEMAKER INSERTION
Anesthesia: LOCAL

## 2012-07-13 MED ORDER — CHLORHEXIDINE GLUCONATE 4 % EX LIQD
60.0000 mL | Freq: Once | CUTANEOUS | Status: DC
  Filled 2012-07-13: qty 60

## 2012-07-13 MED ORDER — LIDOCAINE HCL (PF) 1 % IJ SOLN
INTRAMUSCULAR | Status: AC
  Filled 2012-07-13: qty 60

## 2012-07-13 MED ORDER — MUPIROCIN 2 % EX OINT
TOPICAL_OINTMENT | CUTANEOUS | Status: AC
  Filled 2012-07-13: qty 22

## 2012-07-13 MED ORDER — SPIRONOLACTONE 12.5 MG HALF TABLET
12.5000 mg | ORAL_TABLET | Freq: Every day | ORAL | Status: DC
  Administered 2012-07-13 – 2012-07-14 (×2): 12.5 mg via ORAL
  Filled 2012-07-13 (×2): qty 1

## 2012-07-13 MED ORDER — BUDESONIDE-FORMOTEROL FUMARATE 160-4.5 MCG/ACT IN AERO
2.0000 | INHALATION_SPRAY | Freq: Two times a day (BID) | RESPIRATORY_TRACT | Status: DC
  Administered 2012-07-13 – 2012-07-14 (×3): 2 via RESPIRATORY_TRACT
  Filled 2012-07-13: qty 6

## 2012-07-13 MED ORDER — MUPIROCIN 2 % EX OINT
TOPICAL_OINTMENT | Freq: Two times a day (BID) | CUTANEOUS | Status: DC
  Administered 2012-07-13: 1 via NASAL
  Filled 2012-07-13: qty 22

## 2012-07-13 MED ORDER — HYDROCODONE-ACETAMINOPHEN 5-325 MG PO TABS
1.0000 | ORAL_TABLET | ORAL | Status: DC | PRN
  Administered 2012-07-13: 1 via ORAL
  Filled 2012-07-13: qty 1

## 2012-07-13 MED ORDER — SODIUM CHLORIDE 0.9 % IJ SOLN: 3.0000 mL | INTRAMUSCULAR | Status: DC | PRN

## 2012-07-13 MED ORDER — SODIUM CHLORIDE 0.9 % IV SOLN: 250.0000 mL | INTRAVENOUS | Status: DC

## 2012-07-13 MED ORDER — SODIUM CHLORIDE 0.9 % IJ SOLN: 3.0000 mL | Freq: Two times a day (BID) | INTRAMUSCULAR | Status: DC

## 2012-07-13 MED ORDER — VILAZODONE HCL 40 MG PO TABS
40.0000 mg | ORAL_TABLET | Freq: Every day | ORAL | Status: DC
  Administered 2012-07-13 – 2012-07-14 (×2): 40 mg via ORAL
  Filled 2012-07-13 (×2): qty 1

## 2012-07-13 MED ORDER — FENTANYL CITRATE 0.05 MG/ML IJ SOLN
INTRAMUSCULAR | Status: AC
  Filled 2012-07-13: qty 2

## 2012-07-13 MED ORDER — YOU HAVE A PACEMAKER BOOK
Freq: Once | Status: AC
  Administered 2012-07-13: 19:00:00
  Filled 2012-07-13: qty 1

## 2012-07-13 MED ORDER — VILAZODONE HCL 40 MG PO TABS
40.0000 mg | ORAL_TABLET | Freq: Every day | ORAL | Status: DC
  Filled 2012-07-13: qty 1

## 2012-07-13 MED ORDER — SODIUM CHLORIDE 0.9 % IV SOLN: 250.0000 mL | INTRAVENOUS | Status: DC | PRN

## 2012-07-13 MED ORDER — SODIUM CHLORIDE 0.45 % IV SOLN
INTRAVENOUS | Status: DC
  Administered 2012-07-13: 06:00:00 via INTRAVENOUS

## 2012-07-13 MED ORDER — MIDAZOLAM HCL 5 MG/5ML IJ SOLN
INTRAMUSCULAR | Status: AC
  Filled 2012-07-13: qty 5

## 2012-07-13 MED ORDER — METOPROLOL SUCCINATE ER 100 MG PO TB24
100.0000 mg | ORAL_TABLET | Freq: Every day | ORAL | Status: DC
  Administered 2012-07-13 – 2012-07-14 (×2): 100 mg via ORAL
  Filled 2012-07-13 (×2): qty 1

## 2012-07-13 MED ORDER — ONDANSETRON HCL 4 MG/2ML IJ SOLN: 4.0000 mg | Freq: Four times a day (QID) | INTRAMUSCULAR | Status: DC | PRN

## 2012-07-13 MED ORDER — ACETAMINOPHEN 325 MG PO TABS: 325.0000 mg | ORAL_TABLET | ORAL | Status: DC | PRN

## 2012-07-13 MED ORDER — CEFAZOLIN SODIUM 1-5 GM-% IV SOLN
1.0000 g | Freq: Four times a day (QID) | INTRAVENOUS | Status: AC
  Administered 2012-07-13 – 2012-07-14 (×3): 1 g via INTRAVENOUS
  Filled 2012-07-13 (×4): qty 50

## 2012-07-13 MED ORDER — BUPROPION HCL ER (XL) 150 MG PO TB24
150.0000 mg | ORAL_TABLET | Freq: Every day | ORAL | Status: DC
  Administered 2012-07-13 – 2012-07-14 (×2): 150 mg via ORAL
  Filled 2012-07-13 (×2): qty 1

## 2012-07-13 NOTE — Progress Notes (Signed)
Assessed for utilization review.

## 2012-07-13 NOTE — Interval H&P Note (Signed)
History and Physical Interval Note:  07/13/2012 7:21 AM  Holly Simmons  has presented today for surgery, with the diagnosis of Increase ventricle thresholds  The various methods of treatment have been discussed with the patient and family. After consideration of risks, benefits and other options for treatment, the patient has consented to  Procedure(s) (LRB) with comments: PERMANENT PACEMAKER INSERTION (N/A) as a surgical intervention .  The patient's history has been reviewed, patient examined, no change in status, stable for surgery.  I have reviewed the patient's chart and labs.  Questions were answered to the patient's satisfaction.     Thompson Grayer

## 2012-07-13 NOTE — Brief Op Note (Signed)
See dictation [phone]

## 2012-07-13 NOTE — H&P (View-Only) (Signed)
PCP:  Scarlette Calico, MD Primary Cardiologist:  Dr Johnsie Cancel  The patient presents today for routine electrophysiology followup.  Since last being seen in our clinic, the patient reports doing very well.   Today, she denies symptoms of palpitations, chest pain, shortness of breath, orthopnea, PND, lower extremity edema, dizziness, presyncope, or syncope.  The patient feels that she is tolerating medications without difficulties and is otherwise without complaint today.   Past Medical History  Diagnosis Date  . Hypertension   . Hyperpotassemia   . Asthma     NOS w/ acute exacerbation  . Hypersomnia   . Depressive disorder   . Diastolic dysfunction   . Pyuria   . Renal insufficiency   . GERD (gastroesophageal reflux disease)   . COPD (chronic obstructive pulmonary disease)   . Obstructive sleep apnea     persistent daytime sleepiness despite cpap  . Allergic rhinitis   . Renal insufficiency   . Psoriasis   . Hypertrophic cardiomyopathy     s/p surgical myomectomy at [facility] 0000000 complicated by septal VSD post procedure requiring reoperation with patch closure and tricuspid valve replacement  . 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 Complete heart block     requiring PPM (MDT) post surgical myomectomy at [facility],  leads are epicardial with abdominal implant, high ventricular threshold at implant   Past Surgical History  Procedure Date  . Appendectomy   . Cholecystectomy   . Tonsillectomy   . Cesarean section   . Abdominal hysterectomy   . Septal myomectomy for hypertrophic cm 06/23/11    by Dr Evelina Dun at [facility], complicated by septal VSD requiring patch repair and tricuspid valve replacement  . 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 Vsd repair 06/2011  . Pacemaker insertion 07/02/11    epicardial wires with abdominal implant at [place] 12/12,  high ventricular lead threshold at implant per Dr Westley Gambles    Current Outpatient Prescriptions   Medication Sig Dispense Refill  . amoxicillin (AMOXIL) 500 MG capsule Take 4 tabs  1  Hour prior to dental visit./cy  4 capsule  2  . Armodafinil (NUVIGIL) 150 MG tablet Take 1 tablet (150 mg total) by mouth as needed.  30 tablet  5  . aspirin 81 MG tablet Take 81 mg by mouth daily.        . Blood Glucose Monitoring Suppl (ONE TOUCH ULTRA MINI) W/DEVICE KIT Test twice daily      . budesonide-formoterol (SYMBICORT) 160-4.5 MCG/ACT inhaler Inhale 2 puffs into the lungs 2 (two) times daily.        Marland Kitchen buPROPion (WELLBUTRIN XL) 150 MG 24 hr tablet Take 1 tablet (150 mg total) by mouth daily.  90 tablet  3  . calcipotriene (DOVONOX) 0.005 % cream Apply topically 2 (two) times daily.      . fexofenadine (ALLEGRA) 180 MG tablet Take 180 mg by mouth daily. prn       . furosemide (LASIX) 40 MG tablet One by mouth every other day  30 tablet  11  . glucose blood (ONE TOUCH ULTRA TEST) test strip Test twice daily  100 each  5  . L-Methylfolate-B6-B12 (METANX) 3-35-2 MG TABS Take 1 tablet by mouth 2 (two) times daily.        . LUXIQ 0.12 % foam APPLY TO SCALP AS DIRECTED  100 g  11  . metoprolol (TOPROL-XL) 100 MG 24 hr tablet Take 1  tablet (100 mg total) by mouth daily.  30 tablet  11  . Multiple Vitamin (MULTIVITAMIN) tablet Take 1 tablet by mouth daily.      Glory Rosebush DELICA LANCETS MISC Test two times daily  100 each  5  . ranitidine (ZANTAC) 300 MG tablet Take 1 tablet (300 mg total) by mouth at bedtime.  90 tablet  3  . spironolactone (ALDACTONE) 25 MG tablet Take 12.5 mg by mouth daily.      . Vilazodone HCl (VIIBRYD) 40 MG TABS Take 1 tablet by mouth daily.  140 tablet  0  . zaleplon (SONATA) 10 MG capsule Take 10 mg by mouth at bedtime as needed.      . zaleplon (SONATA) 10 MG capsule Take 10 mg by mouth as needed.      . [DISCONTINUED] Alum & Mag Hydroxide-Simeth (MAGIC MOUTHWASH) SOLN Take 5 mLs by mouth 3 (three) times daily. 14 days then stop         Allergies  Allergen Reactions  .  Tetanus Toxoids Swelling  . Nsaids     REACTION: proteinuria  . Sulfonamide Derivatives     History   Social History  . Marital Status: Married    Spouse Name: [name]    Number of Children: 2  . Years of Education: N/A   Occupational History  .     Social History Main Topics  . Smoking status: Never Smoker   . Smokeless tobacco: Never Used  . Alcohol Use: No  . Drug Use: No  . Sexually Active: Not Currently   Other Topics Concern  . Not on file   Social History Narrative  . No narrative on file    Family History  Problem Relation Age of Onset  . Cancer Father     bladder  . Cancer Maternal Aunt     ovarian  . Heart disease Maternal Grandfather   . Heart disease Paternal Grandfather   . Cancer Other     breast  . Dementia Other   . Hypertension Other    Physical Exam: Filed Vitals:   06/16/12 1558  BP: 106/62  Pulse: 77  Height: 5\' 3"  (1.6 m)  Weight: 190 lb (86.183 kg)  SpO2: 98%    GEN- The patient is overweight appearing, alert and oriented x 3 today.   Head- normocephalic, atraumatic Eyes-  Sclera clear, conjunctiva pink Ears- hearing intact Oropharynx- clear Neck- supple, Lungs- Clear to ausculation bilaterally, normal work of breathing Abd- pacemaker pocket is well healed Heart- Regular rate and rhythm, 2/6 SEM LUSB, 1/6 diastolic murmur LUSB GI- soft, NT, ND, + BS Extremities- no clubbing, cyanosis, or edema  ekg today reveals AV sequential pacing  Pacemaker interrogation- reviewed in detail today,  See PACEART report  Assessment and Plan:

## 2012-07-13 NOTE — Op Note (Signed)
NAMEMarland Simmons  [place], [place] NO.:  [id number]  MEDICAL RECORD NO.:  [id number]  LOCATION:  6527                         FACILITY:  [facility]  PHYSICIAN:  Thompson Grayer, MD       DATE OF BIRTH:  08-16-1940  DATE OF PROCEDURE:  07/13/2012 DATE OF DISCHARGE:                              OPERATIVE REPORT   SURGEON:  Thompson Grayer, MD  PREPROCEDURE DIAGNOSES: 1. Complete heart block. 2. Prior pacemaker implantation with high pacing threshold.  POSTPROCEDURE DIAGNOSES: 1. Complete heart block. 2. Prior pacemaker implantation with high pacing threshold.  PROCEDURES:  Dual-chamber pacemaker implantation with the ventricular lead placed through the coronary sinus to pace the left ventricle.  INTRODUCTION:  Holly Simmons is a pleasant 71 year old female with a history of hypertrophic cardiomyopathy.  She previously underwent septal myomectomy at [facility].  This was complicated by perforation of the interventricular septum with damage to the tricuspid valve and complete heart block.  She underwent patch placement over the ventricular septum as well as repair of the tricuspid valve.  She was quite ill at that time.  She had an epicardial pacing system placed in the OR with tunneling of leads into the left lower abdomen.  Per my discussion with Dr. Westley Gambles at [facility], she was noted to have very high ventricular pacing threshold at time of initial implantation.  As she was sick at that time, the plan was for her to recover and subsequently return for transvenous pacing system.  She has done well since that time.  She now presents for pacemaker placement via the traditional left axillary approach.  Given her significantly altered anatomy through the tricuspid valve anulus as well as tricuspid valve repair, it is felt that she may require ventricular lead through the coronary sinus rather than a traditional right ventricular pacing lead.  DESCRIPTION OF PROCEDURE:  Informed written  consent was obtained and the patient was brought to the electrophysiology lab in a fasting state. She was adequately sedated with intravenous Versed and fentanyl as outlined in the nursing report.  The patient's left chest was prepped and draped in the usual sterile fashion by the EP lab staff.  The skin overlying the left deltopectoral region was infiltrated with lidocaine for local analgesia.  A 4-cm incision was made over the left deltopectoral region.  A left subcutaneous pacemaker pocket was fashioned using a combination of sharp and blunt dissection. Electrocautery was required to assure hemostasis.  The left axillary vein was cannulated with fluoroscopic visualization.  No contrast was required for this endeavor.  Through the left axillary vein, a Medtronic model [id number] ventricular lead was initially attempted to be placed into the right ventricle through the tricuspid valve.  Due to the patient's markedly altered tricuspid valve annular region, this was quite challenging and ultimately I felt that would actually be more prudent to place the ventricular lead through the coronary sinus to pace the left ventricle.  The Medtronic 5076 lead was therefore removed from the body. A Medtronic MB2 guide was therefore advanced through the left axillary vein into the low right atrium.  A hexapolar curved Damato catheter was introduced through the MB2 guide and advanced into the coronary sinus body.  The  MB2 guide was then advanced over the hexapolar catheter into the body of the coronary sinus.  A coronary sinus venogram was performed by hand injection of nonionic contrast.  This demonstrated a moderate- sized coronary sinus with a very small distal posterolateral branch and a large mid lateral branch.  I therefore elected to place the LV lead through the mid lateral branch.  A whisper CSJ wire was advanced through the MB2 guide into the lateral portion of this mid lateral vein.  A Medtronic  model [id number] (serial number [id number]) coronary sinus lead was advanced over the whisper wire into a lateral position along the mid portion between the left ventricular base and apex.  In this location, R- waves measured 22 mV with an impedance of 1314 ohms and a threshold of 0.5 V at 0.5 milliseconds.  The guide was then removed and the lead was secured to the pectoralis fascia with 2 separate #2 silk sutures over the suture sleeve.  The Medtronic model [id number] (serial number [id number]) lead was advanced through the left axillary vein into the right atrium. Atrial lead P-waves measured 1.7 mV with impedance of 690 ohms and a threshold of 1.2 V at 0.5 milliseconds.  This lead was therefore secured to the pectoralis fascia in this location with 2 separate #2 silk sutures.  The pocket was then irrigated with copious gentamicin solution.  The leads were then connected to a [place] (serial number [id number]) pacemaker.  The pocket was irrigated with copious gentamicin solution.  The pacemaker was then placed into the pocket and secured to the pectoralis fascia with a single #2 silk suture.  The pocket was then closed in 2 layers with 2.0 Vicryl suture for the subcutaneous and subcuticular layers.  Steri-Strips and a sterile dressing were then applied.  There were no early apparent complications.  CONCLUSIONS: 1. Successful pacemaker implantation for complete heart block.  Her     previously implanted epicardial system with very high thresholds     was left in place and reprogrammed to VVI 40 today.  The new device     has an atrial lead within the right atrial appendage and a     ventricular lead within a lateral branch of the coronary sinus. 2. No early apparent complications. 3. An additional 1 hour was required for this procedure today.     Thompson Grayer, MD     JA/MEDQ  D:  07/13/2012  T:  07/13/2012  Job:  [id number]  cc:   Wallis Bamberg. Johnsie Cancel, MD, [facility]

## 2012-07-14 ENCOUNTER — Ambulatory Visit (HOSPITAL_COMMUNITY): Payer: Medicare Other

## 2012-07-14 DIAGNOSIS — I442 Atrioventricular block, complete: Secondary | ICD-10-CM

## 2012-07-14 NOTE — Discharge Summary (Signed)
ELECTROPHYSIOLOGY PROCEDURE DISCHARGE SUMMARY    Patient ID: Holly Simmons,  MRN: [id number], DOB/AGE: 1940/09/25 71 y.o.  Admit date: 07/13/2012 Discharge date: 07/14/2012  Primary Care Physician: Scarlette Calico, MD Primary Cardiologist: Jenkins Rouge, MD  Primary Discharge Diagnosis:  1.  Elevated RV threshold with previously implanted epicardial system.  S/p implantation of a transvenous dual chamber pacemaker this admission (RA lead and LV lead).  Previously implanted device was programmed VVI 40.  Secondary Discharge Diagnosis:  1.  Atrial fibrillation 2.  Chronic diastolic heart failure 3.  Hypertension 4.  Asthma 5.  Hypersomnia 6.  Depressive disorder 7.  Pyuria 8.  GERD 9.  COPD 10.  Obstructive sleep apnea 11.  Hypertrophic cardiomyopathy s/p surgical myomectomy at [facility] 0000000 complicated by septal VSD post procedure requiring reoperation with patch closure and tricuspid valve replacement.  Also complicated by CHB status post epicardial pacing system with abdominal pacemaker implant.  Procedures This Admission:  1.  Implantation of a dual chamber pacemaker on 07-13-2012.  The patient received a Medtronic [id number] pacemaker with model number 5076 right atrial lead and 4296 left ventricular lead (a lead was unable to be passed through tricuspid valve to the RV).  The previously implanted abdominal system was reprogrammed to VVI 40.  There were no early apparent complications.  Brief HPI: Mrs. Fielding is a 71 year old female with the above problem list.  Since her pacemaker implant following myomectomy at [facility], she has had chronically elevated RV thresholds.  She was re-evaluated by Dr Rayann Heman in the office and at that time, it was recommended that a new transvenous system be placed.  Risks, benefits, and alternatives were discussed with the patient as outlined in Dr Jackalyn Lombard office note.  The patient wished to proceed.   Hospital Course:  The patient was admitted and  underwent implantation of a dual chamber pacemaker with details as outlined above.   She was monitored on telemetry overnight which demonstrated AV pacing.  Left chest was without hematoma or ecchymosis.  The device was interrogated and found to be functioning normally.  CXR was obtained and demonstrated no pneumothorax status post device implantation.  Wound care, arm mobility, and restrictions were reviewed with the patient.  Dr Rayann Heman examined the patient and considered them stable for discharge to home.    Discharge Vitals: Blood pressure 107/55, pulse 70, temperature 98.6 F (37 C), temperature source Oral, resp. rate 17, height 5\' 3"  (1.6 m), weight 194 lb (87.998 kg), SpO2 90.00%.    Labs:   Lab Results  Component Value Date   WBC 6.7 07/06/2012   HGB 13.2 07/06/2012   HCT 40.6 07/06/2012   MCV 91.9 07/06/2012   PLT 163.0 07/06/2012      Discharge Medications:    Medication List     As of 07/14/2012  9:56 AM    TAKE these medications         amoxicillin 500 MG capsule   Commonly known as: AMOXIL   Take 4 tabs  1  Hour prior to dental visit./cy      Armodafinil 150 MG tablet   Take 1 tablet (150 mg total) by mouth as needed.      aspirin 81 MG tablet   Take 81 mg by mouth daily.      budesonide-formoterol 160-4.5 MCG/ACT inhaler   Commonly known as: SYMBICORT   Inhale 2 puffs into the lungs 2 (two) times daily.      buPROPion 150 MG 24 hr  tablet   Commonly known as: WELLBUTRIN XL   Take 1 tablet (150 mg total) by mouth daily.      fexofenadine 180 MG tablet   Commonly known as: ALLEGRA   Take 180 mg by mouth daily. prn      furosemide 40 MG tablet   Commonly known as: LASIX   One by mouth every other day      glucose blood test strip   Test twice daily      LUXIQ 0.12 % foam   Generic drug: Betamethasone Valerate   APPLY TO SCALP AS DIRECTED      metoprolol succinate 100 MG 24 hr tablet   Commonly known as: TOPROL-XL   Take 1 tablet (100 mg total) by  mouth daily.      multivitamin 3-35-2 MG Tabs tablet   Take 1 tablet by mouth 2 (two) times daily.      multivitamin tablet   Take 1 tablet by mouth daily.      ONE TOUCH ULTRA MINI W/DEVICE Kit   Test twice daily      ONETOUCH DELICA LANCETS Misc   Test two times daily      ranitidine 300 MG tablet   Commonly known as: ZANTAC   Take 1 tablet (300 mg total) by mouth at bedtime.      spironolactone 25 MG tablet   Commonly known as: ALDACTONE   Take 0.5 tablets (12.5 mg total) by mouth daily.      triamcinolone cream 0.5 %   Commonly known as: KENALOG   Apply topically 3 (three) times daily.      Vilazodone HCl 40 MG Tabs   Commonly known as: VIIBRYD   Take 1 tablet by mouth daily.         Disposition:  Discharge Orders    Future Appointments: Provider: Department: Dept Phone: Center:   07/20/2012 3:00 PM [facility] [place]) [phone] [facility]   07/20/2012 3:30 PM Josue Hector, MD [place]) [phone] [facility]   09/08/2012 1:00 PM Janith Lima, MD [facility] [place] [phone] [facility]   10/15/2012 10:15 AM Thompson Grayer, MD [place]) [phone] [facility]     Future Orders Please Complete By Expires   Diet - low sodium heart healthy      Increase activity slowly      Discharge instructions      Comments:   Please see post pacemaker discharge instructions     Follow-up Information    Follow up with Jenkins Rouge, MD. On 07/20/2012. (At 3:30 PM with Dr. Johnsie Cancel (will also do wound check at this visit))    Contact information:   [address] [place] [address] [phone]       Follow up with Thompson Grayer, MD. On 10/15/2012. (At 10:15 AM)    Contact information:   [address] [place] [address] [phone]          Duration of Discharge Encounter: Greater than 30 minutes including  physician time.  Signed Thompson Grayer, MD

## 2012-07-14 NOTE — Progress Notes (Signed)
   ELECTROPHYSIOLOGY ROUNDING NOTE    Patient Name: Holly Simmons Date of Encounter: 07-14-2012    SUBJECTIVE:Patient with moderate incisional soreness.  S/p dual chamber transvenous pacemaker implant 07-13-2012 (pt with previously implanted epicardial system with high RV thresholds).  No chest pain or shortness of breath.   TELEMETRY: Reviewed telemetry pt in AV pacing Physical Exam: Filed Vitals:   07/13/12 1333 07/13/12 2000 07/14/12 0000 07/14/12 0400  BP: 124/60 103/41 114/54 107/55  Pulse: 70 70 70 70  Temp: 97.2 F (36.2 C) 97.5 F (36.4 C) 97.5 F (36.4 C) 98.6 F (37 C)  TempSrc: Oral Oral Oral Oral  Resp: 21 20 17 17   Height:      Weight:      SpO2: 93% 93% 91% 90%    GEN- The patient is well appearing, alert and oriented x 3 today.   Head- normocephalic, atraumatic Eyes-  Sclera clear, conjunctiva pink Ears- hearing intact Oropharynx- clear Neck- supple, Lungs- Clear to ausculation bilaterally, normal work of breathing Chest- pacemaker pocket is without hematoma Heart- Regular rate and rhythm,   GI- soft, NT, ND, + BS Extremities- no clubbing, cyanosis, or edema MS- no significant deformity or atrophy Skin- no rash or lesion Psych- euthymic mood, full affect Neuro- strength and sensation are intact  Pacemaker interrogation- reviewed in paper chart today, normal function cxr reveals stable lead, no ptx    Assessment and Plan: Complete heart block Doing well s/p pacemaker placement Discharge to home   Wound care, arm mobility, restrictions reviewed with patient.    Routine follow up scheduled.

## 2012-07-20 ENCOUNTER — Ambulatory Visit (INDEPENDENT_AMBULATORY_CARE_PROVIDER_SITE_OTHER): Payer: Medicare Other | Admitting: *Deleted

## 2012-07-20 ENCOUNTER — Ambulatory Visit (INDEPENDENT_AMBULATORY_CARE_PROVIDER_SITE_OTHER): Payer: Medicare Other | Admitting: Cardiovascular Disease

## 2012-07-20 ENCOUNTER — Encounter: Payer: Self-pay | Admitting: Cardiovascular Disease

## 2012-07-20 ENCOUNTER — Encounter: Payer: Self-pay | Admitting: Internal Medicine

## 2012-07-20 VITALS — BP 124/70 | HR 76 | Ht 63.0 in | Wt 186.0 lb

## 2012-07-20 DIAGNOSIS — I2589 Other forms of chronic ischemic heart disease: Secondary | ICD-10-CM

## 2012-07-20 DIAGNOSIS — Z950 Presence of cardiac pacemaker: Secondary | ICD-10-CM

## 2012-07-20 DIAGNOSIS — Q210 Ventricular septal defect: Secondary | ICD-10-CM

## 2012-07-20 DIAGNOSIS — I442 Atrioventricular block, complete: Secondary | ICD-10-CM

## 2012-07-20 DIAGNOSIS — I255 Ischemic cardiomyopathy: Secondary | ICD-10-CM

## 2012-07-20 DIAGNOSIS — I071 Rheumatic tricuspid insufficiency: Secondary | ICD-10-CM

## 2012-07-20 DIAGNOSIS — I421 Obstructive hypertrophic cardiomyopathy: Secondary | ICD-10-CM

## 2012-07-20 DIAGNOSIS — Q254 Congenital malformation of aorta unspecified: Secondary | ICD-10-CM

## 2012-07-20 DIAGNOSIS — I10 Essential (primary) hypertension: Secondary | ICD-10-CM

## 2012-07-20 DIAGNOSIS — I079 Rheumatic tricuspid valve disease, unspecified: Secondary | ICD-10-CM

## 2012-07-20 LAB — PACEMAKER DEVICE OBSERVATION
AL AMPLITUDE: 2.8 mv
AL THRESHOLD: 1 V
BAMS-0001: 175 {beats}/min
RV LEAD IMPEDENCE PM: 1111 Ohm
VENTRICULAR PACING PM: 100

## 2012-07-20 MED ORDER — AMOXICILLIN 500 MG PO CAPS: ORAL_CAPSULE | ORAL | Status: DC

## 2012-07-20 NOTE — Patient Instructions (Addendum)
Your physician recommends that you schedule a follow-up appointment in:  [place] [facility] [place] Your physician recommends that you continue on your current medications as directed. Please refer to the Current Medication list given to you today. Your physician has requested that you have an echocardiogram. Echocardiography is a painless test that uses sound waves to create images of your heart. It provides your doctor with information about the size and shape of your heart and how well your heart's chambers and valves are working. This procedure takes approximately one hour. There are no restrictions for this procedure. DX  CARDIOMYOPATHY  SEE AMBER  ON 07/26/12 AT 10:00 AM

## 2012-07-20 NOTE — Assessment & Plan Note (Signed)
Pacer dependant with new CS system  F/U pacer clinic Monday to check incision.  Plan to explant abdominal generator in 3 months if CS system has good threshholds

## 2012-07-20 NOTE — Assessment & Plan Note (Signed)
Murmur much softer may be closing more post op

## 2012-07-20 NOTE — Progress Notes (Signed)
Patient ID: Holly Simmons, female   DOB: 1941-07-12, 71 y.o.   MRN: [id number] She has a fairly complex history. She has a history of HOCM and underwent surgery at [facility] recently. She was seen in followup by Dr. Rayann Heman 11/15/11 . She underwent myomectomy at [facility] 06/23/11. 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 She underwent cardioversion for atrial flutter and was placed on Coumadin and amiodarone. Epicardial leads are known to have a high threshold.  Echocardiogram 08/27/11: Status post septal myomectomy complicated by VSD now with VSD patch repair-patch covers the basal septal wall and leads to akinesis of the wall within that area, small to moderate residual VSD at the apical end of the patch, EF 50%, moderate diastolic dysfunction, no LVOT or SAM, trivial MR, TR with mildly elevated mean gradient.  Maintaining sinus rhythm and her amiodarone and Coumadin were both discontinued.  Subsequently had new pacer placed last week.  Could not cross TVR for stable lead but got good threshholds in CS.  Interogated with [employer] today and good thresholds.  Pacer site a little swollen but no signs of infection.  Possibly mild diaphragmatic pacing but patient not sure  ROS: Denies fever, malais, weight loss, blurry vision, decreased visual acuity, cough, sputum, SOB, hemoptysis, pleuritic pain, palpitaitons, heartburn, abdominal pain, melena, lower extremity edema, claudication, or rash.  All other systems reviewed and negative  General: Affect appropriate Healthy:  appears stated age 1: normal Neck supple with no adenopathy JVP normal no bruits no thyromegaly Pacer site mildly swollen Lungs clear with no wheezing and good diaphragmatic motion Heart:  S1/S2 softer SEM VSD murmur much diminished, no rub, gallop or click PMI  normal Abdomen: benighn, BS positve, no tenderness, no AAA Pacer generator in LUQ no bruit.  No HSM or HJR Distal pulses intact with no bruits No edema Neuro non-focal Skin warm and dry No muscular weakness   Current Outpatient Prescriptions  Medication Sig Dispense Refill  . amoxicillin (AMOXIL) 500 MG capsule Take 4 tabs  1  Hour prior to dental visit./cy  30 capsule  1  . Armodafinil (NUVIGIL) 150 MG tablet Take 1 tablet (150 mg total) by mouth as needed.  30 tablet  5  . aspirin 81 MG tablet Take 81 mg by mouth daily.        . Blood Glucose Monitoring Suppl (ONE TOUCH ULTRA MINI) W/DEVICE KIT Test twice daily      . budesonide-formoterol (SYMBICORT) 160-4.5 MCG/ACT inhaler Inhale 2 puffs into the lungs 2 (two) times daily.        Marland Kitchen buPROPion (WELLBUTRIN XL) 150 MG 24 hr tablet Take 1 tablet (150 mg total) by mouth daily.  90 tablet  3  . fexofenadine (ALLEGRA) 180 MG tablet Take 180 mg by mouth daily. prn       . furosemide (LASIX) 40 MG tablet One by mouth every other day  30 tablet  11  . glucose blood (ONE TOUCH ULTRA TEST) test strip Test twice daily  100 each  5  . L-Methylfolate-B6-B12 (METANX) 3-35-2 MG TABS Take 1 tablet by mouth 2 (two) times daily.        . LUXIQ 0.12 % foam APPLY TO SCALP AS DIRECTED  100 g  11  . metoprolol succinate (TOPROL-XL) 100 MG 24 hr tablet Take 1 tablet (100 mg total)  by mouth daily.  90 tablet  3  . Multiple Vitamin (MULTIVITAMIN) tablet Take 1 tablet by mouth daily.      Glory Rosebush DELICA LANCETS MISC Test two times daily  100 each  5  . ranitidine (ZANTAC) 300 MG tablet Take 1 tablet (300 mg total) by mouth at bedtime.  90 tablet  3  . spironolactone (ALDACTONE) 25 MG tablet Take 0.5 tablets (12.5 mg total) by mouth daily.  90 tablet  3  . triamcinolone cream (KENALOG) 0.5 % Apply topically 3 (three) times daily.  30 g  3  . Vilazodone HCl (VIIBRYD) 40 MG TABS Take 1 tablet by mouth daily.  140 tablet  0  . [DISCONTINUED] Alum & Mag  Hydroxide-Simeth (MAGIC MOUTHWASH) SOLN Take 5 mLs by mouth 3 (three) times daily. 14 days then stop         Allergies  Sulfonamide derivatives; Tetanus toxoids; and Nsaids  Electrocardiogram:  Assessment and Plan

## 2012-07-20 NOTE — Assessment & Plan Note (Signed)
S/P TVR  F/U echo in a month make sure no damage when trying to pass transvenous lead

## 2012-07-20 NOTE — Assessment & Plan Note (Signed)
Well controlled.  Continue current medications and low sodium Dash type diet.    

## 2012-07-20 NOTE — Progress Notes (Signed)
Patient presents for wound check of recently implanted pacemaker.  No problems with shortness of breath, chest pain, palpitations, or syncope.  Pt does have moderate swelling at pacemaker pocket.  Also c/o intermittent diaphragmatic stim-- pt able to tolerate at this time.  Because of new LV lead with no RV lead implanted and pacemaker dependency, will leave LV outputs at 3.5V until 3 month visit.  Pt will call back if unbearable between now and then and we can reprogram.   Device interrogated and found to be functioning normally.  No changes made today.  See PaceArt report for full details.  Plan ROV with Dr. Rayann Heman in 3 months.  Pt will return to office Monday, December 23rd to re-evaluate pacemaker pocket.   Chanetta Marshall, RN, BSN 07/20/2012 3:29 PM

## 2012-07-20 NOTE — Assessment & Plan Note (Signed)
S/P post myectomy complicated by MI, VSD and need for pacer and TVR  Echo in a month  SBE prophylaxis given script for amoxacillin

## 2012-07-21 ENCOUNTER — Ambulatory Visit: Payer: Medicare Other

## 2012-07-22 ENCOUNTER — Encounter: Payer: Self-pay | Admitting: Internal Medicine

## 2012-07-22 ENCOUNTER — Ambulatory Visit (INDEPENDENT_AMBULATORY_CARE_PROVIDER_SITE_OTHER): Payer: Medicare Other | Admitting: Internal Medicine

## 2012-07-22 VITALS — BP 120/78 | HR 88 | Temp 98.1°F | Ht 66.0 in | Wt 184.0 lb

## 2012-07-22 DIAGNOSIS — R05 Cough: Secondary | ICD-10-CM

## 2012-07-22 DIAGNOSIS — J209 Acute bronchitis, unspecified: Secondary | ICD-10-CM

## 2012-07-22 DIAGNOSIS — R059 Cough, unspecified: Secondary | ICD-10-CM

## 2012-07-22 MED ORDER — BENZONATATE 200 MG PO CAPS: 200.0000 mg | ORAL_CAPSULE | Freq: Three times a day (TID) | ORAL | Status: DC | PRN

## 2012-07-22 MED ORDER — AZITHROMYCIN 250 MG PO TABS: ORAL_TABLET | ORAL | Status: DC

## 2012-07-22 NOTE — Progress Notes (Signed)
HPI  Pt presents to the clinic with c/o cough and chest congestion x 1 week. She has tried taking Hycodan that she had left over but it makes her feel like her heart is beating out of her chest. She also c/o fever and fatigue. She does have problems with allergies and does get bronchitis every so often. She is not able to get the phlegm up often but when she does, it is green and thick. The cough is intermittent and occurs all throughout the day. She has had sick contacts.  Review of Systems    Past Medical History  Diagnosis Date  . Hypertension   . Hyperpotassemia   . Asthma     NOS w/ acute exacerbation  . Hypersomnia   . Depressive disorder   . Diastolic dysfunction   . Pyuria   . Renal insufficiency   . GERD (gastroesophageal reflux disease)   . COPD (chronic obstructive pulmonary disease)   . Obstructive sleep apnea     persistent daytime sleepiness despite cpap  . Allergic rhinitis   . Renal insufficiency   . Psoriasis   . Hypertrophic cardiomyopathy     s/p surgical myomectomy at [facility] 0000000 complicated by septal VSD post procedure requiring reoperation with patch closure and tricuspid valve replacement  . 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 Complete heart block     requiring PPM (MDT) post surgical myomectomy at [facility],  leads are epicardial with abdominal implant, high ventricular threshold at implant  . Myocardial infarction   . Borderline diabetes   . Heart murmur   . 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    Family History  Problem Relation Age of Onset  . Cancer Father     bladder  . Cancer Maternal Aunt     ovarian  . Heart disease Maternal Grandfather   . Heart disease Paternal Grandfather   . Cancer Other     breast  . Dementia Other   . Hypertension Other     History   Social History  . Marital Status: Married    Spouse Name: [name]    Number of Children: 2  . Years of Education: N/A   Occupational History  .     Social History Main Topics   . Smoking status: Never Smoker   . Smokeless tobacco: Never Used  . Alcohol Use: No  . Drug Use: No  . Sexually Active: Not Currently   Other Topics Concern  . Not on file   Social History Narrative  . No narrative on file    Allergies  Allergen Reactions  . Sulfonamide Derivatives Anaphylaxis and Swelling  . Tetanus Toxoids Swelling  . Nsaids     REACTION: proteinuria     Constitutional: Positive headache, fatigue and fever. Denies abrupt weight changes.  HEENT:  Denies eye redness, eye pain, pressure behind the eyes, facial pain, nasal congestion  ear pain, ringing in the ears, wax buildup, runny nose or bloody nose. Respiratory: Positive cough and thick green sputum production. Denies difficulty breathing or shortness of breath.  Cardiovascular: Denies chest pain, chest tightness, palpitations or swelling in the hands or feet.   No other specific complaints in a complete review of systems (except as listed in HPI above).  Objective:    General: Appears her stated age, well developed, well nourished in NAD. HEENT: Head: normal shape and size; Eyes: sclera white, no icterus, conjunctiva pink, PERRLA and EOMs intact; Ears: Tm's gray and intact, normal  light reflex; Nose: mucosa pink and moist, septum midline; Throat/Mouth: + PND. Teeth present, mucosa pink and moist, no exudate noted, no lesions or ulcerations noted.  Neck: Mild cervical lymphadenopathy. Neck supple, trachea midline. No massses, lumps or thyromegaly present.  Cardiovascular: Normal rate and rhythm. S1,S2 noted.  No murmur, rubs or gallops noted. No JVD or BLE edema. No carotid bruits noted. Pulmonary/Chest: Normal effort and fine crackles noted in bilateral bases. No respiratory distress. No wheezes, rales or ronchi noted.      Assessment & Plan:   Acute bronchitis  eRx given for tessalon pearls eRx given for Azithromax x 5 days  RTC as needed or if symptoms persist.

## 2012-07-22 NOTE — Patient Instructions (Addendum)

## 2012-07-26 ENCOUNTER — Ambulatory Visit (INDEPENDENT_AMBULATORY_CARE_PROVIDER_SITE_OTHER): Payer: Medicare Other | Admitting: *Deleted

## 2012-07-26 DIAGNOSIS — Z950 Presence of cardiac pacemaker: Secondary | ICD-10-CM

## 2012-07-26 NOTE — Progress Notes (Signed)
Wound check only. Steri-strips removed.  Pt with local reaction to steri-strips.  Advised to treat with OTC hydrocortisone cream taking care not to get on incision itself.  Wound well healed, edges approximated.  Swelling from last week is now gone.  Pt to follow up as scheduled with Dr Rayann Heman.

## 2012-07-30 ENCOUNTER — Telehealth: Payer: Self-pay | Admitting: Internal Medicine

## 2012-07-30 NOTE — Telephone Encounter (Signed)
Spoke with patient  Let her know that she should try Benadryl today as the hydrocortisone cream is not working.  I will call the patient after lunch to see if this has helped

## 2012-07-30 NOTE — Telephone Encounter (Signed)
New problem:   C/O rash from the strips. Seen in office on Monday by Amber - strips was taken off . Now up toward neck. By site of pacemaker still red, itchy.

## 2012-07-30 NOTE — Telephone Encounter (Signed)
Spoke with the patient and the Benadryl has worked both the rash and redness are better

## 2012-08-06 ENCOUNTER — Other Ambulatory Visit: Payer: Self-pay | Admitting: Internal Medicine

## 2012-08-09 ENCOUNTER — Telehealth: Payer: Self-pay

## 2012-08-09 NOTE — Telephone Encounter (Signed)
Received fax from pharmacy stating that Metanx $126.41 and pt would like something cheaper

## 2012-08-12 ENCOUNTER — Telehealth: Payer: Self-pay

## 2012-08-12 NOTE — Telephone Encounter (Signed)
Pharmacy notified via returned fax 

## 2012-08-12 NOTE — Telephone Encounter (Signed)
Try an otc b vitamin complex

## 2012-08-12 NOTE — Telephone Encounter (Signed)
Received fax from pharmacy stating that Metanx $126.41 and pt would like something cheaper

## 2012-08-20 ENCOUNTER — Other Ambulatory Visit (INDEPENDENT_AMBULATORY_CARE_PROVIDER_SITE_OTHER): Payer: Medicare PPO

## 2012-08-20 ENCOUNTER — Ambulatory Visit (INDEPENDENT_AMBULATORY_CARE_PROVIDER_SITE_OTHER): Payer: Medicare PPO | Admitting: Internal Medicine

## 2012-08-20 ENCOUNTER — Telehealth: Payer: Self-pay | Admitting: Internal Medicine

## 2012-08-20 ENCOUNTER — Encounter: Payer: Self-pay | Admitting: Internal Medicine

## 2012-08-20 VITALS — BP 110/62 | HR 80 | Temp 97.6°F | Wt 189.0 lb

## 2012-08-20 DIAGNOSIS — I10 Essential (primary) hypertension: Secondary | ICD-10-CM

## 2012-08-20 DIAGNOSIS — M109 Gout, unspecified: Secondary | ICD-10-CM

## 2012-08-20 DIAGNOSIS — IMO0001 Reserved for inherently not codable concepts without codable children: Secondary | ICD-10-CM

## 2012-08-20 HISTORY — DX: Gout, unspecified: M10.9

## 2012-08-20 LAB — URIC ACID: Uric Acid, Serum: 9 mg/dL — ABNORMAL HIGH (ref 2.4–7.0)

## 2012-08-20 MED ORDER — METHYLPREDNISOLONE ACETATE 80 MG/ML IJ SUSP
120.0000 mg | Freq: Once | INTRAMUSCULAR | Status: AC
  Administered 2012-08-20: 120 mg via INTRAMUSCULAR

## 2012-08-20 MED ORDER — PREDNISONE 10 MG PO TABS: ORAL_TABLET | ORAL | Status: DC

## 2012-08-20 MED ORDER — ALLOPURINOL 100 MG PO TABS: 100.0000 mg | ORAL_TABLET | Freq: Every day | ORAL | Status: DC

## 2012-08-20 NOTE — Assessment & Plan Note (Signed)
Moderate, delcines pain med, but ok for depomedrol IM, predpac asd, check uric acid level, for start allopurinol 100 after predpack gor prevention

## 2012-08-20 NOTE — Progress Notes (Signed)
Subjective:    Patient ID: Holly Simmons, female    DOB: May 25, 1941, 72 y.o.   MRN: [id number]  HPI  Here to f/u with acute with right first MTP acute onset mod red/swelling/tender/pain for 2 days, hard to even sleep with cover on the foot due to pain.  No recent trauma, fever.  Has hx of same dec 2013, better with nsiad use.  Pt denies chest pain, increased sob or doe, wheezing, orthopnea, PND, increased LE swelling, palpitations, dizziness or syncope.   Pt denies polydipsia, polyuria, Pt states overall good compliance with meds.   Pt denies fever, wt loss, night sweats, loss of appetite, or other constitutional symptoms.  Past Medical History  Diagnosis Date  . Hypertension   . Hyperpotassemia   . Asthma     NOS w/ acute exacerbation  . Hypersomnia   . Depressive disorder   . Diastolic dysfunction   . Pyuria   . Renal insufficiency   . GERD (gastroesophageal reflux disease)   . COPD (chronic obstructive pulmonary disease)   . Obstructive sleep apnea     persistent daytime sleepiness despite cpap  . Allergic rhinitis   . Renal insufficiency   . Psoriasis   . Hypertrophic cardiomyopathy     s/p surgical myomectomy at [facility] 0000000 complicated by septal VSD post procedure requiring reoperation with patch closure and tricuspid valve replacement  . 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 Complete heart block     requiring PPM (MDT) post surgical myomectomy at [facility],  leads are epicardial with abdominal implant, high ventricular threshold at implant  . Myocardial infarction   . Borderline diabetes   . Heart murmur   . 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   Past Surgical History  Procedure Date  . Appendectomy   . Cholecystectomy   . Tonsillectomy   . Cesarean section   . Abdominal hysterectomy   . Septal myomectomy for hypertrophic cm 06/23/11    by Dr Evelina Dun at [facility], complicated by septal VSD requiring patch repair and tricuspid valve replacement  . 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 Vsd repair 06/2011  . Pacemaker insertion 07/02/11    epicardial wires with abdominal implant at [place] 12/12,  high ventricular lead threshold at implant per Dr Westley Gambles  . Insert / replace / remove pacemaker 07/13/2012    reports that she has never smoked. She has never used smokeless tobacco. She reports that she does not drink alcohol or use illicit drugs. family history includes Cancer in her father, maternal aunt, and other; Dementia in her other; Heart disease in her maternal grandfather and paternal grandfather; and Hypertension in her other. Allergies  Allergen Reactions  . Sulfonamide Derivatives Anaphylaxis and Swelling  . Tetanus Toxoids Swelling  . Nsaids     REACTION: proteinuria   Current Outpatient Prescriptions on File Prior to Visit  Medication Sig Dispense Refill  . amoxicillin (AMOXIL) 500 MG capsule Take 4 tabs  1  Hour prior to dental visit./cy  30 capsule  1  . Armodafinil (NUVIGIL) 150 MG tablet Take 1 tablet (150 mg total) by mouth as needed.  30 tablet  5  . aspirin 81 MG tablet Take 81 mg by mouth daily.        Marland Kitchen azithromycin (ZITHROMAX) 250 MG tablet Take 2 tablets today then 1 tablet daily for 4 days  6 tablet  0  . benzonatate (TESSALON) 200 MG capsule Take 1  capsule (200 mg total) by mouth 3 (three) times daily as needed for cough.  20 capsule  1  . Blood Glucose Monitoring Suppl (ONE TOUCH ULTRA MINI) W/DEVICE KIT Test twice daily      . budesonide-formoterol (SYMBICORT) 160-4.5 MCG/ACT inhaler Inhale 2 puffs into the lungs 2 (two) times daily.        Marland Kitchen buPROPion (WELLBUTRIN XL) 150 MG 24 hr tablet Take 1 tablet (150 mg total) by mouth daily.  90 tablet  3  . fexofenadine (ALLEGRA) 180 MG tablet Take 180 mg by mouth daily. prn       . furosemide (LASIX) 40 MG tablet One by mouth every other day  30 tablet  11  . glucose blood (ONE TOUCH ULTRA TEST) test strip Test twice daily  100 each  5  . LUXIQ 0.12 % foam  APPLY TO SCALP AS DIRECTED  100 g  11  . metoprolol succinate (TOPROL-XL) 100 MG 24 hr tablet Take 1 tablet (100 mg total) by mouth daily.  90 tablet  3  . Multiple Vitamin (MULTIVITAMIN) tablet Take 1 tablet by mouth daily.      Glory Rosebush DELICA LANCETS MISC Test two times daily  100 each  5  . ranitidine (ZANTAC) 300 MG tablet TAKE ONE TABLET BY MOUTH NIGHTLY AT BEDTIME  90 tablet  2  . spironolactone (ALDACTONE) 25 MG tablet Take 0.5 tablets (12.5 mg total) by mouth daily.  90 tablet  3  . triamcinolone cream (KENALOG) 0.5 % Apply topically 3 (three) times daily.  30 g  3  . Vilazodone HCl (VIIBRYD) 40 MG TABS Take 1 tablet by mouth daily.  140 tablet  0  . allopurinol (ZYLOPRIM) 100 MG tablet Take 1 tablet (100 mg total) by mouth daily.  90 tablet  3  . L-Methylfolate-B6-B12 (METANX) 3-35-2 MG TABS Take 1 tablet by mouth 2 (two) times daily.        . [DISCONTINUED] Alum & Mag Hydroxide-Simeth (MAGIC MOUTHWASH) SOLN Take 5 mLs by mouth 3 (three) times daily. 14 days then stop        Review of Systems  Constitutional: Negative for unexpected weight change, or unusual diaphoresis  HENT: Negative for tinnitus.   Eyes: Negative for photophobia and visual disturbance.  Respiratory: Negative for choking and stridor.   Gastrointestinal: Negative for vomiting and blood in stool.  Genitourinary: Negative for hematuria and decreased urine volume.  Skin: Negative for  wound.  Neurological: Negative for tremors and numbness other than noted  Psychiatric/Behavioral: Negative for decreased concentration or  hyperactivity.       Objective:   Physical Exam BP 110/62  Pulse 80  Temp 97.6 F (36.4 C) (Oral)  Wt 189 lb (85.73 kg)  SpO2 93% VS noted,  Constitutional: Pt appears well-developed and well-nourished.  HENT: Head: NCAT.  Right Ear: External ear normal.  Left Ear: External ear normal.  Eyes: Conjunctivae and EOM are normal. Pupils are equal, round, and reactive to light.  Neck: Normal  range of motion. Neck supple.  Cardiovascular: Normal rate and regular rhythm.   Pulmonary/Chest: Effort normal and breath sounds normal.  Neurological: Pt is alert. Not confused  Skin: Skin is warm. No erythema. except right first MTP with 1-2+ red/tender/swelling/tender Psychiatric: Pt behavior is normal. Thought content normal.     Assessment & Plan:

## 2012-08-20 NOTE — Assessment & Plan Note (Signed)
stable overall by history and exam, recent data reviewed with pt, and pt to continue medical treatment as before,  to f/u any worsening symptoms or concerns Lab Results  Component Value Date   HGBA1C 6.7* 05/12/2012   To call for onset polys or cbg >200 with steroid tx

## 2012-08-20 NOTE — Telephone Encounter (Signed)
Patient Information:  Caller Name: Jeana  Phone: [phone]  Patient: Holly Simmons, Holly Simmons  Gender: Female  DOB: [birth year]  Age: 72 Years  PCP: Scarlette Calico (Adults only)  Office Follow Up:  Does the office need to follow up with this patient?: No  Instructions For The Office: N/A   Symptoms  Reason For Call & Symptoms: Patient onset of Gout discomfort , yesterday 08/19/12.  Location of Inside of "right foot into right great toe". Pain worsening today . Last episode 07/10/12 and saw Roxy Cedar but it was on her Left foot. Treated with Depo Medrol injection which relieved the discomfort. She describes Rt foot swollen and red. Painful to touch or sheets/covers touch. Denies injury.  Reviewed Health History In EMR: Yes  Reviewed Medications In EMR: Yes  Reviewed Allergies In EMR: Yes  Reviewed Surgeries / Procedures: Yes  Date of Onset of Symptoms: 08/19/2012  Guideline(s) Used:  Foot Pain  Disposition Per Guideline:   See Today in Office  Reason For Disposition Reached:   Severe pain (e.g., excruciating, unable to do any normal activities)  Advice Given:  Aggravating Factors   Aging: Foot pain is more common in the elderly. During the aging process, the feet widen and flatten, and the skin becomes drier.  Shoes: Poorly fitting or overly tight shoes are the underlying cause of many painful foot conditions. High heels are bad for feet.  Call Back If:  Swelling, redness, or fever occur  Severe pain not relieved by pain medication  Pain lasts over 7 days  You become worse.  Appointment Scheduled:  08/20/2012 15:30:00 Appointment Scheduled Provider:  Cathlean Cower (Adults only)

## 2012-08-20 NOTE — Patient Instructions (Addendum)
You had the steroid shot today Please take all new medication as prescribed - the prednisone Please take all new medication as prescribed - the Allopurinol only After the prednisone is done You have a "uric acid" level ordered, that can be done at any time you have other blood work done for Dr Ronnald Ramp in the future Please continue all other medications as before Please remember you blood sugar can increase somewhat on the prednisone, but will come down again after the prednisone is finished

## 2012-08-20 NOTE — Assessment & Plan Note (Signed)
stable overall by history and exam, recent data reviewed with pt, and pt to continue medical treatment as before,  to f/u any worsening symptoms or concerns BP Readings from Last 3 Encounters:  08/20/12 110/62  07/22/12 120/78  07/20/12 124/70

## 2012-08-24 ENCOUNTER — Other Ambulatory Visit: Payer: Self-pay | Admitting: Internal Medicine

## 2012-08-24 ENCOUNTER — Ambulatory Visit (INDEPENDENT_AMBULATORY_CARE_PROVIDER_SITE_OTHER): Payer: Medicare PPO | Admitting: Cardiovascular Disease

## 2012-08-24 ENCOUNTER — Ambulatory Visit (HOSPITAL_COMMUNITY): Payer: Medicare PPO | Attending: Cardiology

## 2012-08-24 VITALS — BP 130/82 | HR 78 | Wt 188.0 lb

## 2012-08-24 DIAGNOSIS — I428 Other cardiomyopathies: Secondary | ICD-10-CM | POA: Insufficient documentation

## 2012-08-24 DIAGNOSIS — I255 Ischemic cardiomyopathy: Secondary | ICD-10-CM

## 2012-08-24 DIAGNOSIS — I10 Essential (primary) hypertension: Secondary | ICD-10-CM | POA: Insufficient documentation

## 2012-08-24 DIAGNOSIS — I421 Obstructive hypertrophic cardiomyopathy: Secondary | ICD-10-CM

## 2012-08-24 DIAGNOSIS — Q254 Congenital malformation of aorta unspecified: Secondary | ICD-10-CM

## 2012-08-24 DIAGNOSIS — I2589 Other forms of chronic ischemic heart disease: Secondary | ICD-10-CM | POA: Insufficient documentation

## 2012-08-24 DIAGNOSIS — N63 Unspecified lump in unspecified breast: Secondary | ICD-10-CM

## 2012-08-24 DIAGNOSIS — I369 Nonrheumatic tricuspid valve disorder, unspecified: Secondary | ICD-10-CM | POA: Insufficient documentation

## 2012-08-24 DIAGNOSIS — I442 Atrioventricular block, complete: Secondary | ICD-10-CM

## 2012-08-24 DIAGNOSIS — Z950 Presence of cardiac pacemaker: Secondary | ICD-10-CM | POA: Insufficient documentation

## 2012-08-24 DIAGNOSIS — Q210 Ventricular septal defect: Secondary | ICD-10-CM

## 2012-08-24 DIAGNOSIS — I079 Rheumatic tricuspid valve disease, unspecified: Secondary | ICD-10-CM

## 2012-08-24 DIAGNOSIS — I509 Heart failure, unspecified: Secondary | ICD-10-CM | POA: Insufficient documentation

## 2012-08-24 DIAGNOSIS — I071 Rheumatic tricuspid insufficiency: Secondary | ICD-10-CM

## 2012-08-24 NOTE — Progress Notes (Signed)
Patient ID: Holly Simmons, female   DOB: [birth year], 72 y.o.   MRN: [id number] She has a fairly complex history. She has a history of HOCM and underwent surgery at [facility] recently. She was seen in followup by Dr. Rayann Heman 11/15/11 . She underwent myomectomy at [facility] 06/23/11. 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 She underwent cardioversion for atrial flutter and was placed on Coumadin and amiodarone. Epicardial leads are known to have a high threshold.  Echocardiogram 08/27/11: Status post septal myomectomy complicated by VSD now with VSD patch repair-patch covers the basal septal wall and leads to akinesis of the wall within that area, small to moderate residual VSD at the apical end of the patch, EF 50%, moderate diastolic dysfunction, no LVOT or SAM, trivial MR, TR with mildly elevated mean gradient.  Maintaining sinus rhythm and her amiodarone and Coumadin were both discontinued.  Subsequently had new pacer placed last week. Could not cross TVR for stable lead but got good threshholds in CS. Interogated with Amber and good thresholds. Pacer site a little swollen but no signs of infection. Possibly mild diaphragmatic pacing but patient not sure  Reviewed echo from today and VSD has closed with overall good EF and normal funcitoning TVR    ROS: Denies fever, malais, weight loss, blurry vision, decreased visual acuity, cough, sputum, SOB, hemoptysis, pleuritic pain, palpitaitons, heartburn, abdominal pain, melena, lower extremity edema, claudication, or rash.  All other systems reviewed and negative  General: Affect appropriate Healthy:  appears stated age 72: normal Neck supple with no adenopathy JVP normal no bruits no thyromegaly Lungs clear with no wheezing and good diaphragmatic motion Heart:  S1/S2 soft SEM no VSD  murmur, no rub, gallop or click PMI normal Abdomen: benighn, BS positve, no tenderness, no AAA no bruit.  No HSM or HJR Distal pulses intact with no bruits No edema Neuro non-focal Skin warm and dry No muscular weakness   Current Outpatient Prescriptions  Medication Sig Dispense Refill  . allopurinol (ZYLOPRIM) 100 MG tablet Take 100 mg by mouth daily. When prednisone runs out pt will begin this med      . Armodafinil (NUVIGIL) 150 MG tablet Take 1 tablet (150 mg total) by mouth as needed.  30 tablet  5  . aspirin 81 MG tablet Take 81 mg by mouth daily.        . B Complex-C (B-COMPLEX WITH VITAMIN C) tablet Take 1 tablet by mouth daily.      . benzonatate (TESSALON) 200 MG capsule Take 1 capsule (200 mg total) by mouth 3 (three) times daily as needed for cough.  20 capsule  1  . budesonide-formoterol (SYMBICORT) 160-4.5 MCG/ACT inhaler Inhale 2 puffs into the lungs 2 (two) times daily.        Marland Kitchen buPROPion (WELLBUTRIN XL) 150 MG 24 hr tablet Take 1 tablet (150 mg total) by mouth daily.  90 tablet  3  . fexofenadine (ALLEGRA) 180 MG tablet Take 180 mg by mouth daily. prn       . furosemide (LASIX) 40 MG tablet One by mouth every other day  30 tablet  11  . L-Methylfolate-B6-B12 (METANX) 3-35-2 MG TABS Take 1 tablet by mouth 2 (two) times daily.        . LUXIQ 0.12 % foam APPLY TO SCALP AS DIRECTED  100 g  11  . metoprolol succinate (TOPROL-XL) 100 MG 24 hr tablet Take 1 tablet (100 mg total) by mouth daily.  90 tablet  3  . Multiple Vitamin (MULTIVITAMIN) tablet Take 1 tablet by mouth daily.      . predniSONE (DELTASONE) 10 MG tablet 2 tabs by mouth per day for 3 days, then 1 tab per day for 3 days  9 tablet  0  . ranitidine (ZANTAC) 300 MG tablet TAKE ONE TABLET BY MOUTH NIGHTLY AT BEDTIME  90 tablet  2  . spironolactone (ALDACTONE) 25 MG tablet Take 0.5 tablets (12.5 mg total) by mouth daily.  90 tablet  3  . triamcinolone cream (KENALOG) 0.5 % Apply topically 3 (three) times daily.  30 g   3  . Vilazodone HCl (VIIBRYD) 40 MG TABS Take 1 tablet by mouth daily.  140 tablet  0  . [DISCONTINUED] Alum & Mag Hydroxide-Simeth (MAGIC MOUTHWASH) SOLN Take 5 mLs by mouth 3 (three) times daily. 14 days then stop         Allergies  Sulfonamide derivatives; Tetanus toxoids; and Nsaids  Electrocardiogram:  07/15/12  AV paced with RBBB   Assessment and Plan

## 2012-08-24 NOTE — Assessment & Plan Note (Signed)
S/P myectomy  VSD is closed and no SAM or LVOT gradient akinetic basal and mid septum

## 2012-08-24 NOTE — Assessment & Plan Note (Signed)
F/U pacer clinic 3/27  CS lead with much better thresholds and epicardial leads placed during operation

## 2012-08-24 NOTE — Assessment & Plan Note (Signed)
S/P TVR as a complication of HOCM repair.  Normal by echo Pacer leasd placed in coronary sinus to not cross valve

## 2012-08-24 NOTE — Progress Notes (Signed)
Echocardiogram performed.  

## 2012-08-24 NOTE — Patient Instructions (Addendum)
Your physician wants you to follow-up in: YEAR WITH DR NISHAN  You will receive a reminder letter in the mail two months in advance. If you don't receive a letter, please call our office to schedule the follow-up appointment.  Your physician recommends that you continue on your current medications as directed. Please refer to the Current Medication list given to you today. 

## 2012-08-24 NOTE — Assessment & Plan Note (Signed)
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

## 2012-09-07 ENCOUNTER — Ambulatory Visit: Payer: Medicare Other | Admitting: Internal Medicine

## 2012-09-08 ENCOUNTER — Other Ambulatory Visit (INDEPENDENT_AMBULATORY_CARE_PROVIDER_SITE_OTHER): Payer: Medicare PPO

## 2012-09-08 ENCOUNTER — Ambulatory Visit (INDEPENDENT_AMBULATORY_CARE_PROVIDER_SITE_OTHER): Payer: Medicare PPO | Admitting: Internal Medicine

## 2012-09-08 ENCOUNTER — Encounter: Payer: Self-pay | Admitting: Internal Medicine

## 2012-09-08 VITALS — BP 116/64 | HR 86 | Temp 97.0°F | Resp 16 | Wt 189.8 lb

## 2012-09-08 DIAGNOSIS — D126 Benign neoplasm of colon, unspecified: Secondary | ICD-10-CM

## 2012-09-08 DIAGNOSIS — R778 Other specified abnormalities of plasma proteins: Secondary | ICD-10-CM

## 2012-09-08 DIAGNOSIS — K648 Other hemorrhoids: Secondary | ICD-10-CM

## 2012-09-08 DIAGNOSIS — IMO0001 Reserved for inherently not codable concepts without codable children: Secondary | ICD-10-CM

## 2012-09-08 DIAGNOSIS — I10 Essential (primary) hypertension: Secondary | ICD-10-CM

## 2012-09-08 DIAGNOSIS — K635 Polyp of colon: Secondary | ICD-10-CM | POA: Insufficient documentation

## 2012-09-08 DIAGNOSIS — N259 Disorder resulting from impaired renal tubular function, unspecified: Secondary | ICD-10-CM

## 2012-09-08 DIAGNOSIS — M8580 Other specified disorders of bone density and structure, unspecified site: Secondary | ICD-10-CM

## 2012-09-08 DIAGNOSIS — K921 Melena: Secondary | ICD-10-CM

## 2012-09-08 LAB — COMPREHENSIVE METABOLIC PANEL WITH GFR
ALT: 20 U/L (ref 0–35)
AST: 21 U/L (ref 0–37)
Albumin: 3.9 g/dL (ref 3.5–5.2)
Alkaline Phosphatase: 126 U/L — ABNORMAL HIGH (ref 39–117)
BUN: 29 mg/dL — ABNORMAL HIGH (ref 6–23)
CO2: 33 meq/L — ABNORMAL HIGH (ref 19–32)
Calcium: 9.6 mg/dL (ref 8.4–10.5)
Chloride: 98 meq/L (ref 96–112)
Creatinine, Ser: 1.2 mg/dL (ref 0.4–1.2)
GFR: 47.32 mL/min — ABNORMAL LOW
Glucose, Bld: 133 mg/dL — ABNORMAL HIGH (ref 70–99)
Potassium: 3.8 meq/L (ref 3.5–5.1)
Sodium: 137 meq/L (ref 135–145)
Total Bilirubin: 0.9 mg/dL (ref 0.3–1.2)
Total Protein: 8 g/dL (ref 6.0–8.3)

## 2012-09-08 LAB — HEMOGLOBIN A1C: Hgb A1c MFr Bld: 7 % — ABNORMAL HIGH (ref 4.6–6.5)

## 2012-09-08 LAB — FECAL OCCULT BLOOD, GUAIAC: Fecal Occult Blood: POSITIVE

## 2012-09-08 NOTE — Progress Notes (Signed)
Subjective:    Patient ID: Holly Simmons, female    DOB: 1941/01/17, 72 y.o.   MRN: [id number]  Diabetes She presents for her follow-up diabetic visit. She has type 2 diabetes mellitus. Her disease course has been stable. There are no hypoglycemic associated symptoms. Pertinent negatives for hypoglycemia include no dizziness, headaches or pallor. Pertinent negatives for diabetes include no blurred vision, no chest pain, no fatigue, no foot paresthesias, no foot ulcerations, no polydipsia, no polyphagia, no polyuria, no visual change, no weakness and no weight loss. There are no hypoglycemic complications. There are no diabetic complications. Current diabetic treatment includes diet. She is compliant with treatment most of the time. Her weight is stable. She is following a generally healthy diet. Meal planning includes avoidance of concentrated sweets. She has not had a previous visit with a dietician. She never participates in exercise. There is no change in her home blood glucose trend. An ACE inhibitor/angiotensin II receptor blocker is being taken. She does not see a podiatrist.Eye exam is current.      Review of Systems  Constitutional: Negative for fever, chills, weight loss, diaphoresis, activity change, appetite change, fatigue and unexpected weight change.  HENT: Negative.   Eyes: Negative.  Negative for blurred vision.  Respiratory: Negative for cough, chest tightness, shortness of breath, wheezing and stridor.   Cardiovascular: Negative for chest pain, palpitations and leg swelling.  Gastrointestinal: Positive for blood in stool and anal bleeding. Negative for nausea, vomiting, abdominal pain, diarrhea, constipation, abdominal distention and rectal pain.  Genitourinary: Negative for dysuria, polyuria, frequency, hematuria, flank pain, vaginal bleeding, vaginal discharge, difficulty urinating, vaginal pain and pelvic pain.  Musculoskeletal: Negative for myalgias, back pain, joint  swelling, arthralgias and gait problem.  Skin: Negative for color change, pallor, rash and wound.  Neurological: Negative for dizziness, weakness, light-headedness, numbness and headaches.  Hematological: Negative for polydipsia, polyphagia and adenopathy. Does not bruise/bleed easily.  Psychiatric/Behavioral: Negative.        Objective:   Physical Exam  Vitals reviewed. Constitutional: She is oriented to person, place, and time. She appears well-developed and well-nourished. No distress.  HENT:  Head: Normocephalic and atraumatic.  Mouth/Throat: Oropharynx is clear and moist. No oropharyngeal exudate.  Eyes: Conjunctivae normal are normal. Right eye exhibits no discharge. Left eye exhibits no discharge. No scleral icterus.  Neck: Normal range of motion. Neck supple. No JVD present. No tracheal deviation present. No thyromegaly present.  Cardiovascular: Normal rate, regular rhythm and intact distal pulses.   No extrasystoles are present. PMI is not displaced.  Exam reveals no gallop and no friction rub.   Murmur heard.  Systolic murmur is present with a grade of 1/6   Diastolic murmur is present with a grade of 1/6  Pulses:      Carotid pulses are 1+ on the right side, and 1+ on the left side.      Radial pulses are 1+ on the right side, and 1+ on the left side.       Femoral pulses are 1+ on the right side, and 1+ on the left side.      Popliteal pulses are 1+ on the right side, and 1+ on the left side.       Dorsalis pedis pulses are 1+ on the right side, and 1+ on the left side.       Posterior tibial pulses are 1+ on the right side, and 1+ on the left side.  Pulmonary/Chest: Effort normal and breath sounds normal. No  stridor. No respiratory distress. She has no wheezes. She has no rales. She exhibits no tenderness.  Abdominal: Soft. Bowel sounds are normal. She exhibits no distension and no mass. There is no tenderness. There is no rebound and no guarding.  Genitourinary: Rectal  exam shows external hemorrhoid and internal hemorrhoid. Rectal exam shows no fissure, no mass, no tenderness and anal tone normal. Guaiac positive stool.  Musculoskeletal: Normal range of motion. She exhibits no edema and no tenderness.  Lymphadenopathy:    She has no cervical adenopathy.  Neurological: She is oriented to person, place, and time.  Skin: Skin is warm and dry. No rash noted. She is not diaphoretic. No erythema. No pallor.  Psychiatric: She has a normal mood and affect. Her behavior is normal. Judgment and thought content normal.      Lab Results  Component Value Date   WBC 6.7 07/06/2012   HGB 13.2 07/06/2012   HCT 40.6 07/06/2012   PLT 163.0 07/06/2012   GLUCOSE 176* 07/06/2012   CHOL 158 05/12/2012   TRIG 97.0 05/12/2012   HDL 45.70 05/12/2012   LDLCALC 93 05/12/2012   ALT 16 05/14/2012   AST 20 05/14/2012   NA 138 07/06/2012   K 3.7 07/06/2012   CL 100 07/06/2012   CREATININE 1.4* 07/06/2012   BUN 26* 07/06/2012   CO2 29 07/06/2012   TSH 1.66 12/04/2011   INR 1.9 09/15/2011   HGBA1C 6.7* 05/12/2012      Assessment & Plan:

## 2012-09-08 NOTE — Patient Instructions (Signed)

## 2012-09-09 ENCOUNTER — Encounter: Payer: Self-pay | Admitting: Gastroenterology

## 2012-09-09 NOTE — Assessment & Plan Note (Signed)
GI referral to see if there are any treatment options

## 2012-09-09 NOTE — Assessment & Plan Note (Signed)
I think the bleeding is caused by hemorrhoids but I have asked her to see GI to see if there is another source of bleeding

## 2012-09-09 NOTE — Assessment & Plan Note (Signed)
I will recheck her renal function today. °

## 2012-09-09 NOTE — Assessment & Plan Note (Signed)
Her BP is well controlled, today I will check her lytes and renal function

## 2012-09-09 NOTE — Assessment & Plan Note (Signed)
I will check her A1C today and will treat if needed 

## 2012-09-09 NOTE — Assessment & Plan Note (Signed)
Referral for GI f/up

## 2012-09-13 LAB — HM MAMMOGRAPHY: HM Mammogram: NORMAL

## 2012-09-14 ENCOUNTER — Ambulatory Visit
Admission: RE | Admit: 2012-09-14 | Discharge: 2012-09-14 | Disposition: A | Discharge status: Home / Self Care | Payer: Medicare PPO | Source: Ambulatory Visit | Attending: Internal Medicine | Admitting: Internal Medicine

## 2012-09-14 DIAGNOSIS — IMO0001 Reserved for inherently not codable concepts without codable children: Secondary | ICD-10-CM

## 2012-09-14 DIAGNOSIS — N63 Unspecified lump in unspecified breast: Secondary | ICD-10-CM

## 2012-09-15 ENCOUNTER — Encounter: Payer: Self-pay | Admitting: *Deleted

## 2012-09-22 ENCOUNTER — Ambulatory Visit (INDEPENDENT_AMBULATORY_CARE_PROVIDER_SITE_OTHER)
Admission: RE | Admit: 2012-09-22 | Discharge: 2012-09-22 | Disposition: A | Discharge status: Home / Self Care | Payer: Medicare PPO | Source: Ambulatory Visit | Attending: Internal Medicine | Admitting: Internal Medicine

## 2012-09-22 DIAGNOSIS — M8580 Other specified disorders of bone density and structure, unspecified site: Secondary | ICD-10-CM

## 2012-09-22 DIAGNOSIS — M899 Disorder of bone, unspecified: Secondary | ICD-10-CM

## 2012-09-23 ENCOUNTER — Encounter: Payer: Self-pay | Admitting: Gastroenterology

## 2012-09-23 ENCOUNTER — Ambulatory Visit (INDEPENDENT_AMBULATORY_CARE_PROVIDER_SITE_OTHER): Payer: Medicare PPO | Admitting: Gastroenterology

## 2012-09-23 VITALS — BP 106/60 | HR 81 | Ht 64.0 in | Wt 191.4 lb

## 2012-09-23 DIAGNOSIS — K625 Hemorrhage of anus and rectum: Secondary | ICD-10-CM

## 2012-09-23 DIAGNOSIS — K921 Melena: Secondary | ICD-10-CM

## 2012-09-23 DIAGNOSIS — K589 Irritable bowel syndrome without diarrhea: Secondary | ICD-10-CM

## 2012-09-23 DIAGNOSIS — Z86010 Personal history of colonic polyps: Secondary | ICD-10-CM

## 2012-09-23 DIAGNOSIS — K649 Unspecified hemorrhoids: Secondary | ICD-10-CM

## 2012-09-23 LAB — HM DEXA SCAN: HM Dexa Scan: -0.7

## 2012-09-23 MED ORDER — HYDROCORTISONE ACETATE 25 MG RE SUPP: 25.0000 mg | Freq: Every day | RECTAL | Status: DC

## 2012-09-23 NOTE — Progress Notes (Signed)
History of Present Illness:  This is a complicated 72 year old Caucasian female with multiple cardiovascular issues including valvular repair, heart block requiring pacemaker isertion, and she also has a history of colon polyps with last colonoscopy 3 years ago.  She describes alternating diarrhea and constipation with occasional straining, and occasional minor rectal bleeding over several months.  She currently is asymptomatic, but has had no evidence of anemia or metabolic difficulties.  Her appetite is good her weight is stable.  She is followed closely by cardiology.  Family history is noncontributory.  I have reviewed this patient's present history, medical and surgical past history, allergies and medications.     ROS:   All systems were reviewed and are negative unless otherwise stated in the HPI.  Allergies  Allergen Reactions  . Sulfonamide Derivatives Anaphylaxis and Swelling  . Tetanus Toxoids Swelling  . Nsaids     REACTION: proteinuria   Outpatient Prescriptions Prior to Visit  Medication Sig Dispense Refill  . allopurinol (ZYLOPRIM) 100 MG tablet Take 100 mg by mouth daily. When prednisone runs out pt will begin this med      . Armodafinil (NUVIGIL) 150 MG tablet Take 1 tablet (150 mg total) by mouth as needed.  30 tablet  5  . aspirin 81 MG tablet Take 81 mg by mouth daily.        . B Complex-C (B-COMPLEX WITH VITAMIN C) tablet Take 1 tablet by mouth daily.      . benzonatate (TESSALON) 200 MG capsule Take 1 capsule (200 mg total) by mouth 3 (three) times daily as needed for cough.  20 capsule  1  . budesonide-formoterol (SYMBICORT) 160-4.5 MCG/ACT inhaler Inhale 2 puffs into the lungs 2 (two) times daily.        Marland Kitchen buPROPion (WELLBUTRIN XL) 150 MG 24 hr tablet Take 1 tablet (150 mg total) by mouth daily.  90 tablet  3  . fexofenadine (ALLEGRA) 180 MG tablet Take 180 mg by mouth daily. prn       . furosemide (LASIX) 40 MG tablet One by mouth every other day  30 tablet  11  .  LUXIQ 0.12 % foam APPLY TO SCALP AS DIRECTED  100 g  11  . metoprolol succinate (TOPROL-XL) 100 MG 24 hr tablet Take 1 tablet (100 mg total) by mouth daily.  90 tablet  3  . Multiple Vitamin (MULTIVITAMIN) tablet Take 1 tablet by mouth daily.      . ranitidine (ZANTAC) 300 MG tablet TAKE ONE TABLET BY MOUTH NIGHTLY AT BEDTIME  90 tablet  2  . spironolactone (ALDACTONE) 25 MG tablet Take 0.5 tablets (12.5 mg total) by mouth daily.  90 tablet  3  . triamcinolone cream (KENALOG) 0.5 % Apply topically 3 (three) times daily.  30 g  3  . Vilazodone HCl (VIIBRYD) 40 MG TABS Take 1 tablet by mouth daily.  140 tablet  0   No facility-administered medications prior to visit.   Past Medical History  Diagnosis Date  . Hypertension   . Hyperpotassemia   . Asthma     NOS w/ acute exacerbation  . Hypersomnia   . Depressive disorder   . Diastolic dysfunction   . Pyuria   . Renal insufficiency   . GERD (gastroesophageal reflux disease)   . COPD (chronic obstructive pulmonary disease)   . Obstructive sleep apnea     persistent daytime sleepiness despite cpap  . Allergic rhinitis   . Psoriasis   . Hypertrophic cardiomyopathy  s/p surgical myomectomy at [facility] 0000000 complicated by septal VSD post procedure requiring reoperation with patch closure and tricuspid valve replacement  . 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 Complete heart block     requiring PPM (MDT) post surgical myomectomy at [facility],  leads are epicardial with abdominal implant, high ventricular threshold at implant  . Myocardial infarction   . Borderline diabetes   . Heart murmur   . 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 Gout 08/20/2012  . Colon polyps     TUBULAR ADENOMAS AND HYPERPLASTIC   Past Surgical History  Procedure Laterality Date  . Appendectomy    . Cholecystectomy    . Tonsillectomy    . Cesarean section    . Abdominal hysterectomy    . Septal myomectomy for hypertrophic cm  06/23/11    by Dr Evelina Dun at [facility],  complicated by septal VSD requiring patch repair and tricuspid valve replacement  . 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 Vsd repair  06/2011  . Pacemaker insertion  07/02/11    epicardial wires with abdominal implant at [place] 12/12,  high ventricular lead threshold at implant per Dr Westley Gambles  . Insert / replace / remove pacemaker  07/13/2012   History   Social History  . Marital Status: Married    Spouse Name: [name]    Number of Children: 2  . Years of Education: N/A   Occupational History  .     Social History Main Topics  . Smoking status: Never Smoker   . Smokeless tobacco: Never Used  . Alcohol Use: No  . Drug Use: No  . Sexually Active: Not Currently   Other Topics Concern  . None   Social History Narrative  . None   Family History  Problem Relation Age of Onset  . Cancer Father     bladder  . Cancer Maternal Aunt     ovarian  . Heart disease Maternal Grandfather   . Heart disease Paternal Grandfather   . Cancer Other     breast  . Dementia Other   . Hypertension Other        Physical Exam: Healthy-appearing patient in no distress.  Blood pressure 106/60, pulse 81 and regular, and weight 191.  BMI 32.83. General well developed well nourished patient in no acute distress, appearing their stated age Eyes PERRLA, no icterus, fundoscopic exam per opthamologist Skin no lesions noted Neck supple, no adenopathy, no thyroid enlargement, no tenderness Chest clear to percussion and auscultation Heart no significant murmurs, gallops or rubs noted Abdomen no hepatosplenomegaly masses or tenderness, BS normal.  Rectal inspection normal no fissures, or fistulae noted.  No masses or tenderness on digital exam. Stool guaiac negative. Extremities no acute joint lesions, edema, phlebitis or evidence of cellulitis. Neurologic patient oriented x 3, cranial nerves intact, no focal neurologic deficits noted. Psychological mental status  normal and normal affect.    Anoscopy: Inspection the rectum is unremarkable as is her index posterior hemorrhoid.  This is confirmed by endoscopic exam which shows a mixed posterior hemorrhoid without any fissures or fistulae.  There no masses or tenderness on digital exam with guaiac-negative stool.    Assessment and plan: Review of previous colonoscopies showed no evidence of severe diverticulosis.  Biopsies showed melanosis coli , and a polyp mixed hyperplastic and adenomatous..  Her rectal bleeding is most consistent with the mixed posterior hemorrhoid confirmed on exam today, and  I placed her on high fiber diet with daily Metamucil, when necessary Sitz -  Baths and when necessary Analpram cream.  She is rerturn  IFOB cards and see me in 6 months.  She needs to have her cardiovascular situation and her pacemaker issues resolved and stabilized befor repeat colonoscopy.  Encounter Diagnoses  Name Primary?  . Rectal bleeding Yes  . Hemorrhoids

## 2012-09-23 NOTE — Patient Instructions (Addendum)
Please purchase Metamucil over the counter. Take as directed.  We have sent the following medications to your pharmacy for you to pick up at your convenience: Anusol Suppositories, please use rectally as directed.  Please make a follow up appointment with Dr. Sharlett Iles in six months.  You watched a video today about hemorrhoids.  Information on Hemorrhoids and a High Fiber Diet below.  Your physician has requested that you go to the basement for the following stool studies before leaving today: IFOB.  ______________________________________________________________________________________________________________________  Hemorrhoids Hemorrhoids are enlarged (dilated) veins around the rectum. There are 2 types of hemorrhoids, and the type of hemorrhoid is determined by its location. Internal hemorrhoids occur in the veins just inside the rectum.They are usually not painful, but they may bleed.However, they may poke through to the outside and become irritated and painful. External hemorrhoids involve the veins outside the anus and can be felt as a painful swelling or hard lump near the anus.They are often itchy and may crack and bleed. Sometimes clots will form in the veins. This makes them swollen and painful. These are called thrombosed hemorrhoids. CAUSES Causes of hemorrhoids include:  Pregnancy. This increases the pressure in the hemorrhoidal veins.  Constipation.  Straining to have a bowel movement.  Obesity.  Heavy lifting or other activity that caused you to strain. TREATMENT Most of the time hemorrhoids improve in 1 to 2 weeks. However, if symptoms do not seem to be getting better or if you have a lot of rectal bleeding, your caregiver may perform a procedure to help make the hemorrhoids get smaller or remove them completely.Possible treatments include:  Rubber band ligation. A rubber band is placed at the base of the hemorrhoid to cut off the circulation.  Sclerotherapy. A  chemical is injected to shrink the hemorrhoid.  Infrared light therapy. Tools are used to burn the hemorrhoid.  Hemorrhoidectomy. This is surgical removal of the hemorrhoid. HOME CARE INSTRUCTIONS   Increase fiber in your diet. Ask your caregiver about using fiber supplements.  Drink enough water and fluids to keep your urine clear or pale yellow.  Exercise regularly.  Go to the bathroom when you have the urge to have a bowel movement. Do not wait.  Avoid straining to have bowel movements.  Keep the anal area dry and clean.  Only take over-the-counter or prescription medicines for pain, discomfort, or fever as directed by your caregiver. If your hemorrhoids are thrombosed:  Take warm sitz baths for 20 to 30 minutes, 3 to 4 times per day.  If the hemorrhoids are very tender and swollen, place ice packs on the area as tolerated. Using ice packs between sitz baths may be helpful. Fill a plastic bag with ice. Place a towel between the bag of ice and your skin.  Medicated creams and suppositories may be used or applied as directed.  Do not use a donut-shaped pillow or sit on the toilet for long periods. This increases blood pooling and pain. SEEK MEDICAL CARE IF:   You have increasing pain and swelling that is not controlled with your medicine.  You have uncontrolled bleeding.  You have difficulty or you are unable to have a bowel movement.  You have pain or inflammation outside the area of the hemorrhoids.  You have chills or an oral temperature above 102 F (38.9 C). MAKE SURE YOU:   Understand these instructions.  Will watch your condition.  Will get help right away if you are not doing well or get worse.  Document Released: 07/18/2000 Document Revised: 10/13/2011 Document Reviewed: 07/01/2010 ExitCare Patient Information 2013 ExitCare,  [place]. ______________________________________________________________________________________________________________________  High-Fiber Diet Fiber is found in fruits, vegetables, and grains. A high-fiber diet encourages the addition of more whole grains, legumes, fruits, and vegetables in your diet. The recommended amount of fiber for adult males is 38 g per day. For adult females, it is 25 g per day. Pregnant and lactating women should get 28 g of fiber per day. If you have a digestive or bowel problem, ask your caregiver for advice before adding high-fiber foods to your diet. Eat a variety of high-fiber foods instead of only a select few type of foods.  PURPOSE  To increase stool bulk.  To make bowel movements more regular to prevent constipation.  To lower cholesterol.  To prevent overeating. WHEN IS THIS DIET USED?  It may be used if you have constipation and hemorrhoids.  It may be used if you have uncomplicated diverticulosis (intestine condition) and irritable bowel syndrome.  It may be used if you need help with weight management.  It may be used if you want to add it to your diet as a protective measure against atherosclerosis, diabetes, and cancer. SOURCES OF FIBER  Whole-grain breads and cereals.  Fruits, such as apples, oranges, bananas, berries, prunes, and pears.  Vegetables, such as green peas, carrots, sweet potatoes, beets, broccoli, cabbage, spinach, and artichokes.  Legumes, such split peas, soy, lentils.  Almonds. FIBER CONTENT IN FOODS Starches and Grains / Dietary Fiber (g)  Cheerios, 1 cup / 3 g  Corn Flakes cereal, 1 cup / 0.7 g  Rice crispy treat cereal, 1 cup / 0.3 g  Instant oatmeal (cooked),  cup / 2 g  Frosted wheat cereal, 1 cup / 5.1 g  Brown, long-grain rice (cooked), 1 cup / 3.5 g  White, long-grain rice (cooked), 1 cup / 0.6 g  Enriched macaroni (cooked), 1 cup / 2.5 g Legumes / Dietary Fiber (g)  Baked beans (canned, plain, or  vegetarian),  cup / 5.2 g  Kidney beans (canned),  cup / 6.8 g  Pinto beans (cooked),  cup / 5.5 g Breads and Crackers / Dietary Fiber (g)  Plain or honey graham crackers, 2 squares / 0.7 g  Saltine crackers, 3 squares / 0.3 g  Plain, salted pretzels, 10 pieces / 1.8 g  Whole-wheat bread, 1 slice / 1.9 g  White bread, 1 slice / 0.7 g  Raisin bread, 1 slice / 1.2 g  Plain bagel, 3 oz / 2 g  Flour tortilla, 1 oz / 0.9 g  Corn tortilla, 1 small / 1.5 g  Hamburger or hotdog bun, 1 small / 0.9 g Fruits / Dietary Fiber (g)  Apple with skin, 1 medium / 4.4 g  Sweetened applesauce,  cup / 1.5 g  Banana,  medium / 1.5 g  Grapes, 10 grapes / 0.4 g  Orange, 1 small / 2.3 g  Raisin, 1.5 oz / 1.6 g  Melon, 1 cup / 1.4 g Vegetables / Dietary Fiber (g)  Green beans (canned),  cup / 1.3 g  Carrots (cooked),  cup / 2.3 g  Broccoli (cooked),  cup / 2.8 g  Peas (cooked),  cup / 4.4 g  Mashed potatoes,  cup / 1.6 g  Lettuce, 1 cup / 0.5 g  Corn (canned),  cup / 1.6 g  Tomato,  cup / 1.1 g Document Released: 07/21/2005 Document Revised: 01/20/2012 Document Reviewed: 10/23/2011 ExitCare Patient Information 2013  [employer].

## 2012-09-28 ENCOUNTER — Other Ambulatory Visit (INDEPENDENT_AMBULATORY_CARE_PROVIDER_SITE_OTHER): Payer: Medicare PPO

## 2012-09-28 DIAGNOSIS — K625 Hemorrhage of anus and rectum: Secondary | ICD-10-CM

## 2012-09-28 DIAGNOSIS — K649 Unspecified hemorrhoids: Secondary | ICD-10-CM

## 2012-09-28 LAB — FECAL OCCULT BLOOD, IMMUNOCHEMICAL: Fecal Occult Bld: NEGATIVE

## 2012-10-15 ENCOUNTER — Encounter: Payer: Medicare Other | Admitting: Internal Medicine

## 2012-10-28 ENCOUNTER — Encounter: Payer: Self-pay | Admitting: Internal Medicine

## 2012-10-28 ENCOUNTER — Encounter: Payer: Self-pay | Admitting: Cardiology

## 2012-10-28 ENCOUNTER — Ambulatory Visit (INDEPENDENT_AMBULATORY_CARE_PROVIDER_SITE_OTHER): Payer: Medicare PPO | Admitting: Cardiology

## 2012-10-28 DIAGNOSIS — I5032 Chronic diastolic (congestive) heart failure: Secondary | ICD-10-CM

## 2012-10-28 DIAGNOSIS — Z950 Presence of cardiac pacemaker: Secondary | ICD-10-CM

## 2012-10-28 DIAGNOSIS — I442 Atrioventricular block, complete: Secondary | ICD-10-CM

## 2012-10-28 DIAGNOSIS — I4891 Unspecified atrial fibrillation: Secondary | ICD-10-CM

## 2012-10-28 LAB — PACEMAKER DEVICE OBSERVATION
AL AMPLITUDE: 4 mv
ATRIAL PACING PM: 98.6
BAMS-0001: 175 {beats}/min
BATTERY VOLTAGE: 2.79 V
RV LEAD IMPEDENCE PM: 1074 Ohm
VENTRICULAR PACING PM: 100

## 2012-10-28 NOTE — Progress Notes (Signed)
ELECTROPHYSIOLOGY OFFICE NOTE  Patient ID: Holly Simmons MRN: [id number], DOB/AGE: 1941/04/28   Date of Visit: 10/28/2012  Primary Physician: Scarlette Calico, MD Primary Cardiologist: Johnsie Cancel, MD Primary EP: Rayann Heman, MD Reason for Visit: EP/device follow-up  History of Present Illness  Holly Simmons presents today for routine electrophysiology followup. She has h/o HOCM and is s/p myomectomy at [facility] which was complicated by interventricular septal perforation resulting in CHB with emergent PPM implantation and TVR. Since last being seen in our clinic, she reports she is doing well. She has no cardiac complaints. Today, she specifically denies chest pain or shortness of breath. She denies palpitations, dizziness, near syncope or syncope. She denies LE swelling, orthopnea, PND or recent weight gain. She reports compliance and feels she is tolerating medications without difficulty.  Past Medical History Past Medical History  Diagnosis Date  . Hypertension   . Hyperpotassemia   . Asthma     NOS w/ acute exacerbation  . Hypersomnia   . Depressive disorder   . Diastolic dysfunction   . Pyuria   . Renal insufficiency   . GERD (gastroesophageal reflux disease)   . COPD (chronic obstructive pulmonary disease)   . Obstructive sleep apnea     persistent daytime sleepiness despite cpap  . Allergic rhinitis   . Psoriasis   . Hypertrophic cardiomyopathy     s/p surgical myomectomy at [facility] 0000000 complicated by septal VSD post procedure requiring reoperation with patch closure and tricuspid valve replacement  . 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 Complete heart block     requiring PPM (MDT) post surgical myomectomy at [facility],  leads are epicardial with abdominal implant, high ventricular threshold at implant  . Myocardial infarction   . Borderline diabetes   . Heart murmur   . 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 Gout 08/20/2012  . Colon polyps     TUBULAR ADENOMAS AND  HYPERPLASTIC    Past Surgical History Past Surgical History  Procedure Laterality Date  . Appendectomy    . Cholecystectomy    . Tonsillectomy    . Cesarean section    . Abdominal hysterectomy    . Septal myomectomy for hypertrophic cm  06/23/11    by Dr Evelina Dun at [facility], complicated by septal VSD requiring patch repair and tricuspid valve replacement  . 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 Vsd repair  06/2011  . Pacemaker insertion  07/02/11    epicardial wires with abdominal implant at [place] 12/12,  high ventricular lead threshold at implant per Dr Westley Gambles  . Insert / replace / remove pacemaker  07/13/2012     Allergies/Intolerances Allergies  Allergen Reactions  . Sulfonamide Derivatives Anaphylaxis and Swelling  . Tetanus Toxoids Swelling  . Other Rash    STERI - STRIPS   . Nsaids     REACTION: proteinuria    Current Home Medications Current Outpatient Prescriptions  Medication Sig Dispense Refill  . allopurinol (ZYLOPRIM) 100 MG tablet Take 100 mg by mouth daily. When prednisone runs out pt will begin this med      . Armodafinil (NUVIGIL) 150 MG tablet Take 1 tablet (150 mg total) by mouth as needed.  30 tablet  5  . aspirin 81 MG tablet Take 81 mg by mouth daily.        . B Complex-C (B-COMPLEX WITH VITAMIN C) tablet Take 1 tablet by mouth daily.      Marland Kitchen  benzonatate (TESSALON) 200 MG capsule Take 1 capsule (200 mg total) by mouth 3 (three) times daily as needed for cough.  20 capsule  1  . budesonide-formoterol (SYMBICORT) 160-4.5 MCG/ACT inhaler Inhale 2 puffs into the lungs 2 (two) times daily.        Marland Kitchen buPROPion (WELLBUTRIN XL) 150 MG 24 hr tablet Take 1 tablet (150 mg total) by mouth daily.  90 tablet  3  . fexofenadine (ALLEGRA) 180 MG tablet Take 180 mg by mouth daily. prn       . furosemide (LASIX) 40 MG tablet One by mouth every other day  30 tablet  11  . hydrocortisone (ANUSOL-HC) 25 MG suppository Place 1 suppository (25  mg total) rectally at bedtime.  12 suppository  1  . LUXIQ 0.12 % foam APPLY TO SCALP AS DIRECTED  100 g  11  . metoprolol succinate (TOPROL-XL) 100 MG 24 hr tablet Take 1 tablet (100 mg total) by mouth daily.  90 tablet  3  . Multiple Vitamin (MULTIVITAMIN) tablet Take 1 tablet by mouth daily.      . ranitidine (ZANTAC) 300 MG tablet TAKE ONE TABLET BY MOUTH NIGHTLY AT BEDTIME  90 tablet  2  . spironolactone (ALDACTONE) 25 MG tablet Take 0.5 tablets (12.5 mg total) by mouth daily.  90 tablet  3  . triamcinolone cream (KENALOG) 0.5 % Apply topically 3 (three) times daily.  30 g  3  . Vilazodone HCl (VIIBRYD) 40 MG TABS Take 1 tablet by mouth daily.  140 tablet  0  . [DISCONTINUED] Alum & Mag Hydroxide-Simeth (MAGIC MOUTHWASH) SOLN Take 5 mLs by mouth 3 (three) times daily. 14 days then stop        No current facility-administered medications for this visit.    Social History Social History  . Marital Status: Married   Social History Main Topics  . Smoking status: Never Smoker   . Smokeless tobacco: Never Used  . Alcohol Use: No  . Drug Use: No   Review of Systems General: No chills, fever, night sweats or weight changes Cardiovascular: No chest pain, dyspnea on exertion, edema, orthopnea, palpitations, paroxysmal nocturnal dyspnea Dermatological: No rash, lesions or masses Respiratory: No cough, dyspnea Urologic: No hematuria, dysuria Abdominal: No nausea, vomiting, diarrhea, bright red blood per rectum, melena, or hematemesis Neurologic: No visual changes, weakness, changes in mental status All other systems reviewed and are otherwise negative except as noted above.  Physical Exam  General: Well developed, well appearing 72 year old female in no acute distress. HEENT: Normocephalic, atraumatic. EOMs intact. Sclera nonicteric. Oropharynx clear.  Neck: Supple. No JVD. Lungs: Respirations regular and unlabored, CTA bilaterally. No wheezes, rales or rhonchi. Heart: RRR. S1, S2  present. No murmurs, rub, S3 or S4. Abdomen: Soft, non-distended.  Extremities: No clubbing, cyanosis or edema. DP/PT/Radials 2+ and equal bilaterally. Psych: Normal affect. Neuro: Alert and oriented X 3. Moves all extremities spontaneously.   Diagnostics 12-lead ECG today shows A paced V sensed at 74 bpm; RBBB; PR 120 msec, QRS 192 msec, QT/QTc 476/528 msec Device interrogation - Normal device function. Thresholds, sensing, impedances consistent with previous measurements. Device programmed to maximize longevity. 2335 mode switch episodes with EGMs consistent with farfield sensing. Atrial sensitivity increased from 0.18 to 0.5 today. No high ventricular rates noted. Device programmed at appropriate safety margins. Histogram distribution appropriate for patient activity level. Estimated longevity 9.5 years.   Assessment and Plan 1. Complete heart block s/p PPM Normal device function See interrogation  report above Return to clinic for follow-up with Dr. Rayann Heman in 9 months (1 year post implant visit) 2. PAF Stable; maintaining SR No changes 3. Chronic diastolic HF Stable; continue medical therapy  This plan of care was formulated with Dr. Rayann Heman who was in to see the patient. Manson Passey, PA-C 10/28/2012, 4:35 PM

## 2012-10-28 NOTE — Patient Instructions (Addendum)
Your physician wants you to follow-up in: December with Dr Rayann Heman Dennis Bast will receive a reminder letter in the mail two months in advance. If you don't receive a letter, please call our office to schedule the follow-up appointment.

## 2012-11-10 ENCOUNTER — Telehealth: Payer: Self-pay | Admitting: Internal Medicine

## 2012-11-10 NOTE — Telephone Encounter (Signed)
Patient received her transmission box from the device company and they do not have a land line.  Only cell phones, wants to know what to do.Marland Kitchen

## 2012-11-10 NOTE — Telephone Encounter (Signed)
New problem    Pt has question regarding her pacemaker

## 2012-11-12 NOTE — Telephone Encounter (Signed)
Spoke w/pt in regards to transmitter. Instructed pt that pt just could have device checked in office vs remote checks since no land line phone. Pt understands and will bring back transmitter in December with Dr Rayann Heman appointment.

## 2012-11-24 ENCOUNTER — Encounter: Payer: Self-pay | Admitting: Gastroenterology

## 2012-12-01 ENCOUNTER — Encounter: Payer: Self-pay | Admitting: Gastroenterology

## 2012-12-03 ENCOUNTER — Other Ambulatory Visit: Payer: Self-pay | Admitting: *Deleted

## 2012-12-03 DIAGNOSIS — R0602 Shortness of breath: Secondary | ICD-10-CM

## 2012-12-03 DIAGNOSIS — I5032 Chronic diastolic (congestive) heart failure: Secondary | ICD-10-CM

## 2012-12-03 MED ORDER — FUROSEMIDE 40 MG PO TABS: ORAL_TABLET | ORAL | Status: DC

## 2012-12-16 ENCOUNTER — Ambulatory Visit (INDEPENDENT_AMBULATORY_CARE_PROVIDER_SITE_OTHER): Payer: Medicare PPO | Admitting: Gastroenterology

## 2012-12-16 ENCOUNTER — Encounter: Payer: Self-pay | Admitting: Gastroenterology

## 2012-12-16 VITALS — BP 100/60 | HR 77 | Ht 63.0 in | Wt 195.0 lb

## 2012-12-16 DIAGNOSIS — K644 Residual hemorrhoidal skin tags: Secondary | ICD-10-CM

## 2012-12-16 DIAGNOSIS — Z86010 Personal history of colonic polyps: Secondary | ICD-10-CM

## 2012-12-16 NOTE — Progress Notes (Signed)
This is a 72 year old Caucasian female with multiple medical problems on multiple medications.  Have been treated her for hemorrhoidal bleeding, she currently denies any GI complaints.  Recent Hemoccult cards are guaiac-negative.  Colonoscopy in June of 2011 was unremarkable except for small adenomatous and hyperplastic polyp removed.  Currently the patient has a bowel movement every other day without melena, hematochezia, or abdominal pain, upper GI or hepatobiliary complaints.  Her appetite is good her weight is stable.  Is no history of chronic iron deficiency anemia.  Current Medications, Allergies, Past Medical History, Past Surgical History, Family History and Social History were reviewed in [employer] record.  ROS: All systems were reviewed and are negative unless otherwise stated in the HPI.          Physical Exam: Blood pressure 100/60, pulse 77 and regular, and weight 195 with a BMI of 34.55.  97% oxygen saturation on room air.  Abdomen shows no organomegaly, masses, or tenderness.  Inspection the rectum shows some redundant perianal skin tissue in small nonbleeding mixed hemorrhoids.  Digital exam was deferred.    Assessment and Plan: Hemorrhoidal bleeding which has stopped with local therapy several months ago.  As mentioned above her IFOB cards were negative.  Have not made any changes in her medications, and advised her to use as much fiber as needed to maintain bowel normality.    No diagnosis found.

## 2012-12-16 NOTE — Patient Instructions (Signed)
Follow up in one year with Dr. Sharlett Iles

## 2013-01-06 ENCOUNTER — Ambulatory Visit (INDEPENDENT_AMBULATORY_CARE_PROVIDER_SITE_OTHER): Payer: Medicare PPO | Admitting: Internal Medicine

## 2013-01-06 ENCOUNTER — Other Ambulatory Visit (INDEPENDENT_AMBULATORY_CARE_PROVIDER_SITE_OTHER): Payer: Medicare PPO

## 2013-01-06 ENCOUNTER — Encounter: Payer: Self-pay | Admitting: Internal Medicine

## 2013-01-06 VITALS — BP 118/68 | HR 85 | Temp 98.1°F | Resp 16

## 2013-01-06 DIAGNOSIS — I10 Essential (primary) hypertension: Secondary | ICD-10-CM

## 2013-01-06 DIAGNOSIS — IMO0001 Reserved for inherently not codable concepts without codable children: Secondary | ICD-10-CM

## 2013-01-06 DIAGNOSIS — N259 Disorder resulting from impaired renal tubular function, unspecified: Secondary | ICD-10-CM

## 2013-01-06 LAB — COMPREHENSIVE METABOLIC PANEL
AST: 18 U/L (ref 0–37)
Albumin: 3.9 g/dL (ref 3.5–5.2)
Alkaline Phosphatase: 119 U/L — ABNORMAL HIGH (ref 39–117)
BUN: 15 mg/dL (ref 6–23)
Calcium: 9.9 mg/dL (ref 8.4–10.5)
Chloride: 103 mEq/L (ref 96–112)
Glucose, Bld: 129 mg/dL — ABNORMAL HIGH (ref 70–99)
Potassium: 4.1 mEq/L (ref 3.5–5.1)
Sodium: 140 mEq/L (ref 135–145)
Total Protein: 8.1 g/dL (ref 6.0–8.3)

## 2013-01-06 NOTE — Patient Instructions (Addendum)
Type 2 Diabetes Mellitus, Adult Type 2 diabetes mellitus, often simply referred to as type 2 diabetes, is a long-lasting (chronic) disease. In type 2 diabetes, the pancreas does not make enough insulin (a hormone), the cells are less responsive to the insulin that is made (insulin resistance), or both. Normally, insulin moves sugars from food into the tissue cells. The tissue cells use the sugars for energy. The lack of insulin or the lack of normal response to insulin causes excess sugars to build up in the blood instead of going into the tissue cells. As a result, high blood sugar (hyperglycemia) develops. The effect of high sugar (glucose) levels can cause many complications. Type 2 diabetes was also previously called adult-onset diabetes but it can occur at any age.  RISK FACTORS  A person is predisposed to developing type 2 diabetes if someone in the family has the disease and also has one or more of the following primary risk factors:  Overweight.  An inactive lifestyle.  A history of consistently eating high-calorie foods. Maintaining a normal weight and regular physical activity can reduce the chance of developing type 2 diabetes. SYMPTOMS  A person with type 2 diabetes may not show symptoms initially. The symptoms of type 2 diabetes appear slowly. The symptoms include:  Increased thirst (polydipsia).  Increased urination (polyuria).  Increased urination during the night (nocturia).  Weight loss. This weight loss may be rapid.  Frequent, recurring infections.  Tiredness (fatigue).  Weakness.  Vision changes, such as blurred vision.  Fruity smell to your breath.  Abdominal pain.  Nausea or vomiting.  Cuts or bruises which are slow to heal.  Tingling or numbness in the hands or feet. DIAGNOSIS Type 2 diabetes is frequently not diagnosed until complications of diabetes are present. Type 2 diabetes is diagnosed when symptoms or complications are present and when blood  glucose levels are increased. Your blood glucose level may be checked by one or more of the following blood tests:  A fasting blood glucose test. You will not be allowed to eat for at least 8 hours before a blood sample is taken.  A random blood glucose test. Your blood glucose is checked at any time of the day regardless of when you ate.  A hemoglobin A1c blood glucose test. A hemoglobin A1c test provides information about blood glucose control over the previous 3 months.  An oral glucose tolerance test (OGTT). Your blood glucose is measured after you have not eaten (fasted) for 2 hours and then after you drink a glucose-containing beverage. TREATMENT   You may need to take insulin or diabetes medicine daily to keep blood glucose levels in the desired range.  You will need to match insulin dosing with exercise and healthy food choices. The treatment goal is to maintain the before meal blood sugar (preprandial glucose) level at 70 130 mg/dL. HOME CARE INSTRUCTIONS   Have your hemoglobin A1c level checked twice a year.  Perform daily blood glucose monitoring as directed by your caregiver.  Monitor urine ketones when you are ill and as directed by your caregiver.  Take your diabetes medicine or insulin as directed by your caregiver to maintain your blood glucose levels in the desired range.  Never run out of diabetes medicine or insulin. It is needed every day.  Adjust insulin based on your intake of carbohydrates. Carbohydrates can raise blood glucose levels but need to be included in your diet. Carbohydrates provide vitamins, minerals, and fiber which are an essential part of   a healthy diet. Carbohydrates are found in fruits, vegetables, whole grains, dairy products, legumes, and foods containing added sugars.    Eat healthy foods. Alternate 3 meals with 3 snacks.  Lose weight if overweight.  Carry a medical alert card or wear your medical alert jewelry.  Carry a 15 gram  carbohydrate snack with you at all times to treat low blood glucose (hypoglycemia). Some examples of 15 gram carbohydrate snacks include:  Glucose tablets, 3 or 4   Glucose gel, 15 gram tube  Raisins, 2 tablespoons (24 grams)  Jelly beans, 6  Animal crackers, 8  Regular pop, 4 ounces (120 mL)  Gummy treats, 9  Recognize hypoglycemia. Hypoglycemia occurs with blood glucose levels of 70 mg/dL and below. The risk for hypoglycemia increases when fasting or skipping meals, during or after intense exercise, and during sleep. Hypoglycemia symptoms can include:  Tremors or shakes.  Decreased ability to concentrate.  Sweating.  Increased heart rate.  Headache.  Dry mouth.  Hunger.  Irritability.  Anxiety.  Restless sleep.  Altered speech or coordination.  Confusion.  Treat hypoglycemia promptly. If you are alert and able to safely swallow, follow the 15:15 rule:  Take 15 20 grams of rapid-acting glucose or carbohydrate. Rapid-acting options include glucose gel, glucose tablets, or 4 ounces (120 mL) of fruit juice, regular soda, or low fat milk.  Check your blood glucose level 15 minutes after taking the glucose.  Take 15 20 grams more of glucose if the repeat blood glucose level is still 70 mg/dL or below.  Eat a meal or snack within 1 hour once blood glucose levels return to normal.    Be alert to polyuria and polydipsia which are early signs of hyperglycemia. An early awareness of hyperglycemia allows for prompt treatment. Treat hyperglycemia as directed by your caregiver.  Engage in at least 150 minutes of moderate-intensity physical activity a week, spread over at least 3 days of the week or as directed by your caregiver. In addition, you should engage in resistance exercise at least 2 times a week or as directed by your caregiver.  Adjust your medicine and food intake as needed if you start a new exercise or sport.  Follow your sick day plan at any time you  are unable to eat or drink as usual.  Avoid tobacco use.  Limit alcohol intake to no more than 1 drink per day for nonpregnant women and 2 drinks per day for men. You should drink alcohol only when you are also eating food. Talk with your caregiver whether alcohol is safe for you. Tell your caregiver if you drink alcohol several times a week.  Follow up with your caregiver regularly.  Schedule an eye exam soon after the diagnosis of type 2 diabetes and then annually.  Perform daily skin and foot care. Examine your skin and feet daily for cuts, bruises, redness, nail problems, bleeding, blisters, or sores. A foot exam by a caregiver should be done annually.  Brush your teeth and gums at least twice a day and floss at least once a day. Follow up with your dentist regularly.  Share your diabetes management plan with your workplace or school.  Stay up-to-date with immunizations.  Learn to manage stress.  Obtain ongoing diabetes education and support as needed.  Participate in, or seek rehabilitation as needed to maintain or improve independence and quality of life. Request a physical or occupational therapy referral if you are having foot or hand numbness or difficulties with grooming,   dressing, eating, or physical activity. SEEK MEDICAL CARE IF:   You are unable to eat food or drink fluids for more than 6 hours.  You have nausea and vomiting for more than 6 hours.  Your blood glucose level is over 240 mg/dL.  There is a change in mental status.  You develop an additional serious illness.  You have diarrhea for more than 6 hours.  You have been sick or have had a fever for a couple of days and are not getting better.  You have pain during any physical activity.  SEEK IMMEDIATE MEDICAL CARE IF:  You have difficulty breathing.  You have moderate to large ketone levels. MAKE SURE YOU:  Understand these instructions.  Will watch your condition.  Will get help right away if  you are not doing well or get worse. Document Released: 07/21/2005 Document Revised: 04/14/2012 Document Reviewed: 02/17/2012 [employer] Patient Information 2014 [employer].  

## 2013-01-06 NOTE — Progress Notes (Signed)
Subjective:    Patient ID: Holly Simmons, female    DOB: May 31, 1941, 72 y.o.   MRN: [id number]  Diabetes She presents for her follow-up diabetic visit. She has type 2 diabetes mellitus. Her disease course has been stable. There are no hypoglycemic associated symptoms. Pertinent negatives for diabetes include no blurred vision, no chest pain, no fatigue, no foot paresthesias, no foot ulcerations, no polydipsia, no polyphagia, no polyuria, no visual change, no weakness and no weight loss. There are no hypoglycemic complications. There are no diabetic complications. Current diabetic treatment includes diet. She is compliant with treatment most of the time. Her weight is stable. She is following a generally unhealthy diet. Meal planning includes avoidance of concentrated sweets. She has not had a previous visit with a dietician. She participates in exercise intermittently. There is no change in her home blood glucose trend. An ACE inhibitor/angiotensin II receptor blocker is not being taken. She does not see a podiatrist.Eye exam is current.      Review of Systems  Constitutional: Negative.  Negative for fever, chills, weight loss, diaphoresis, fatigue and unexpected weight change.  HENT: Negative.   Eyes: Negative.  Negative for blurred vision.  Respiratory: Negative.  Negative for cough, shortness of breath, wheezing and stridor.   Cardiovascular: Negative.  Negative for chest pain.  Gastrointestinal: Negative.   Endocrine: Negative.  Negative for polydipsia, polyphagia and polyuria.  Musculoskeletal: Negative.   Skin: Negative.   Allergic/Immunologic: Negative.   Neurological: Negative for weakness.  Hematological: Negative.   Psychiatric/Behavioral: Negative.        Objective:   Physical Exam  Vitals reviewed. Constitutional: She is oriented to person, place, and time. She appears well-developed and well-nourished. No distress.  HENT:  Head: Normocephalic and atraumatic.   Mouth/Throat: Oropharynx is clear and moist. No oropharyngeal exudate.  Eyes: Conjunctivae are normal. Right eye exhibits no discharge. Left eye exhibits no discharge. No scleral icterus.  Neck: Normal range of motion. Neck supple. No JVD present. No tracheal deviation present. No thyromegaly present.  Cardiovascular: Normal rate, regular rhythm, S1 normal, S2 normal and intact distal pulses.  Exam reveals no gallop and no friction rub.   Murmur heard.  Decrescendo systolic murmur is present with a grade of 1/6   No diastolic murmur is present  Pulmonary/Chest: Effort normal and breath sounds normal. No stridor. No respiratory distress. She has no wheezes. She has no rales. She exhibits no tenderness.  Abdominal: Soft. Bowel sounds are normal. She exhibits no distension and no mass. There is no tenderness. There is no rebound and no guarding.  Musculoskeletal: Normal range of motion. She exhibits no edema and no tenderness.  Lymphadenopathy:    She has no cervical adenopathy.  Neurological: She is oriented to person, place, and time.  Skin: Skin is warm and dry. No rash noted. She is not diaphoretic. No erythema. No pallor.  Psychiatric: She has a normal mood and affect. Her behavior is normal. Judgment and thought content normal.     Lab Results  Component Value Date   WBC 6.7 07/06/2012   HGB 13.2 07/06/2012   HCT 40.6 07/06/2012   PLT 163.0 07/06/2012   GLUCOSE 133* 09/08/2012   CHOL 158 05/12/2012   TRIG 97.0 05/12/2012   HDL 45.70 05/12/2012   LDLCALC 93 05/12/2012   ALT 20 09/08/2012   AST 21 09/08/2012   NA 137 09/08/2012   K 3.8 09/08/2012   CL 98 09/08/2012   CREATININE 1.2 09/08/2012   BUN  29* 09/08/2012   CO2 33* 09/08/2012   TSH 1.66 12/04/2011   INR 1.9 09/15/2011   HGBA1C 7.0* 09/08/2012       Assessment & Plan:

## 2013-01-06 NOTE — Assessment & Plan Note (Signed)
Her renal function is stable 

## 2013-01-06 NOTE — Assessment & Plan Note (Signed)
Her BP is well controlled Her lytes and renal function look good

## 2013-01-06 NOTE — Assessment & Plan Note (Signed)
Her A1C has improved

## 2013-01-17 ENCOUNTER — Other Ambulatory Visit: Payer: Self-pay

## 2013-01-17 DIAGNOSIS — Z1231 Encounter for screening mammogram for malignant neoplasm of breast: Secondary | ICD-10-CM

## 2013-03-01 ENCOUNTER — Ambulatory Visit
Admission: RE | Admit: 2013-03-01 | Discharge: 2013-03-01 | Disposition: A | Discharge status: Home / Self Care | Payer: Medicare PPO | Source: Ambulatory Visit

## 2013-03-01 DIAGNOSIS — Z1231 Encounter for screening mammogram for malignant neoplasm of breast: Secondary | ICD-10-CM

## 2013-03-01 LAB — HM MAMMOGRAPHY: HM Mammogram: NORMAL

## 2013-04-08 ENCOUNTER — Telehealth: Payer: Self-pay | Admitting: Gastroenterology

## 2013-04-08 NOTE — Telephone Encounter (Signed)
When I called the pt back, her husband stated she was in the ER in [place]. COLON on 01/30/10 with tubular adenomas and hyperplastic polyps, melanosis coli. She has not been seen since the COLON. Instructed the husband to call if we can be of service.

## 2013-04-13 ENCOUNTER — Encounter: Payer: Self-pay | Admitting: Pulmonary Disease

## 2013-04-13 ENCOUNTER — Ambulatory Visit (INDEPENDENT_AMBULATORY_CARE_PROVIDER_SITE_OTHER): Payer: Medicare PPO | Admitting: Pulmonary Disease

## 2013-04-13 VITALS — BP 88/56 | HR 74 | Temp 97.9°F | Ht 63.0 in | Wt 202.8 lb

## 2013-04-13 DIAGNOSIS — Z23 Encounter for immunization: Secondary | ICD-10-CM

## 2013-04-13 DIAGNOSIS — G4733 Obstructive sleep apnea (adult) (pediatric): Secondary | ICD-10-CM

## 2013-04-13 MED ORDER — ARMODAFINIL 150 MG PO TABS: 150.0000 mg | ORAL_TABLET | ORAL | Status: DC | PRN

## 2013-04-13 NOTE — Assessment & Plan Note (Signed)
The pt is doing well with cpap, and feels her sleep and daytime alertness are adequate.  I have encouraged her to work aggressively on weight loss.  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

## 2013-04-13 NOTE — Progress Notes (Signed)
  Subjective:    Patient ID: Holly Simmons, female    DOB: [birth year], 72 y.o.   MRN: [id number]  HPI The patient comes in today for followup of her obstructive sleep apnea.  She is wearing CPAP compliantly, and feels that she sleeps well with the device with adequate daytime alertness.  She has some intermittent breakthrough sleepiness during the day, for which she takes nuvigil.  Unfortunately, her weight is up 10 pounds since last visit.   Review of Systems  Constitutional: Negative for fever and unexpected weight change.  HENT: Positive for voice change ( x 6 months). Negative for ear pain, nosebleeds, congestion, sore throat, rhinorrhea, sneezing, trouble swallowing, dental problem, postnasal drip and sinus pressure.   Eyes: Negative for redness and itching.  Respiratory: Positive for cough. Negative for chest tightness, shortness of breath and wheezing.   Cardiovascular: Negative for palpitations and leg swelling.  Gastrointestinal: Negative for nausea and vomiting.  Genitourinary: Negative for dysuria.  Musculoskeletal: Negative for joint swelling.  Skin: Negative for rash.  Neurological: Positive for light-headedness ( BP low 88/56). Negative for headaches.  Hematological: Does not bruise/bleed easily.  Psychiatric/Behavioral: Negative for dysphoric mood. The patient is not nervous/anxious.        Objective:   Physical Exam Obese female in nad Nose without discharge or purulence No skin breakdown or pressure necrosis from cpap mask.  Neck without LN or TMG LE with mild edema and varicosities, no cyanosis Alert and oriented, moves all 4.        Assessment & Plan:

## 2013-04-13 NOTE — Patient Instructions (Addendum)
Will give you a flu shot today Will refill your nuvigil Work on weight loss Keep up with cpap supplies and mask changes.  followup with me in one year if doing well.

## 2013-04-14 ENCOUNTER — Other Ambulatory Visit (INDEPENDENT_AMBULATORY_CARE_PROVIDER_SITE_OTHER): Payer: Medicare PPO

## 2013-04-14 ENCOUNTER — Ambulatory Visit (INDEPENDENT_AMBULATORY_CARE_PROVIDER_SITE_OTHER): Payer: Medicare PPO | Admitting: Internal Medicine

## 2013-04-14 ENCOUNTER — Encounter: Payer: Self-pay | Admitting: Internal Medicine

## 2013-04-14 VITALS — BP 102/62 | HR 68 | Temp 98.2°F | Resp 16 | Wt 199.0 lb

## 2013-04-14 DIAGNOSIS — N259 Disorder resulting from impaired renal tubular function, unspecified: Secondary | ICD-10-CM

## 2013-04-14 DIAGNOSIS — I10 Essential (primary) hypertension: Secondary | ICD-10-CM

## 2013-04-14 DIAGNOSIS — N200 Calculus of kidney: Secondary | ICD-10-CM | POA: Insufficient documentation

## 2013-04-14 DIAGNOSIS — N390 Urinary tract infection, site not specified: Secondary | ICD-10-CM

## 2013-04-14 LAB — URINALYSIS, ROUTINE W REFLEX MICROSCOPIC
Bilirubin Urine: NEGATIVE
Nitrite: NEGATIVE
pH: 5.5 (ref 5.0–8.0)

## 2013-04-14 LAB — BASIC METABOLIC PANEL
Calcium: 9.8 mg/dL (ref 8.4–10.5)
Creatinine, Ser: 1.3 mg/dL — ABNORMAL HIGH (ref 0.4–1.2)

## 2013-04-14 MED ORDER — AMPICILLIN 500 MG PO CAPS: 500.0000 mg | ORAL_CAPSULE | Freq: Four times a day (QID) | ORAL | Status: DC

## 2013-04-14 NOTE — Assessment & Plan Note (Signed)
Her BP is well controlled 

## 2013-04-14 NOTE — Patient Instructions (Signed)
Chronic Kidney Disease Chronic kidney disease occurs when the kidneys are damaged over a long period. The kidneys are two organs that lie on either side of the spine between the middle of the back and the front of the abdomen. The kidneys:   Remove wastes and extra water from the blood.   Produce important hormones. These help keep bones strong, regulate blood pressure, and help create red blood cells.   Balance the fluids and chemicals in the blood and tissues. A small amount of kidney damage may not cause problems, but a large amount of damage may make it difficult or impossible for the kidneys to work the way they should. If steps are not taken to slow down the kidney damage or stop it from getting worse, the kidneys may stop working permanently. Most of the time, chronic kidney disease does not go away. However, it can often be controlled, and those with the disease can usually live normal lives. CAUSES  The most common causes of chronic kidney disease are diabetes and high blood pressure (hypertension). Chronic kidney disease may also be caused by:   Diseases that cause kidneys' filters to become inflamed.   Diseases that affect the immune system.   Genetic diseases.   Medicines that damage the kidneys, such as anti-inflammatory medicines.  Poisoning or exposure to toxic substances.   A reoccurring kidney or urinary infection.   A problem with urine flow. This may be caused by:   Cancer.   Kidney stones.   An enlarged prostate in males. SYMPTOMS  Because the kidney damage in chronic kidney disease occurs slowly, symptoms develop slowly and may not be obvious until the kidney damage becomes severe. A person may have a kidney disease for years without showing any symptoms. Symptoms can include:   Swelling (edema) of the legs, ankles, or feet.   Tiredness (lethargy).   Nausea or vomiting.   Confusion.   Problems with urination, such as:   Decreased urine  production.   Frequent urination, especially at night.   Frequent accidents in children who are potty trained.   Muscle twitches and cramps.   Shortness of breath.  Weakness.   Persistent itchiness.   Loss of appetite.  Metallic taste in the mouth.  Trouble sleeping.  Slowed development in children.  Short stature in children. DIAGNOSIS  Chronic kidney disease may be detected and diagnosed by tests, including blood, urine, imaging, or kidney biopsy tests.  TREATMENT  Most chronic kidney diseases cannot be cured. Treatment usually involves relieving symptoms and preventing or slowing the progression of the disease. Treatment may include:   A special diet. You may need to avoid alcohol and foods thatare salty and high in potassium.   Medicines. These may:   Lower blood pressure.   Relieve anemia.   Relieve swelling.   Protect the bones. HOME CARE INSTRUCTIONS   Follow your prescribed diet.   Only take over-the-counter or prescription medicines as directed by your caregiver.  Do not take any new medicines (prescription, over-the-counter, or nutritional supplements) unless approved by your caregiver. Many medicines can worsen your kidney damage or need to have the dose adjusted.   Quit smoking if you are a smoker. Talk to your caregiver about a smoking cessation program.   Keep all follow-up appointments as directed by your caregiver. SEEK IMMEDIATE MEDICAL CARE IF:  Your symptoms get worse or you develop new symptoms.   You develop symptoms of end-stage kidney disease. These include:   Headaches.  Abnormally dark or light skin.   Numbness in the hands or feet.   Easy bruising.   Frequent hiccups.   Menstruation stops.   You have a fever.   You have decreased urine production.   You havepain or bleeding when urinating. MAKE SURE YOU:  Understand these instructions.  Will watch your condition.  Will get help right  away if you are not doing well or get worse. FOR MORE INFORMATION  American Association of Kidney Patients: BombTimer.gl National Kidney Foundation: www.kidney.[place]: https://mathis.com/ [facility]: www.lifeoptions.org and www.kidneyschool.org Document Released: 04/29/2008 Document Revised: 07/07/2012 Document Reviewed: 03/19/2012 [facility] Patient Information 2014 [place], [place].

## 2013-04-14 NOTE — Assessment & Plan Note (Signed)
I have asked to see urology to see if this needs to be treated any further

## 2013-04-14 NOTE — Assessment & Plan Note (Signed)
Start ampicillin

## 2013-04-14 NOTE — Assessment & Plan Note (Signed)
Her renal function is stable 

## 2013-04-14 NOTE — Progress Notes (Signed)
Subjective:    Patient ID: Holly Simmons, female    DOB: [birth year], 72 y.o.   MRN: [id number]  HPI Comments: She returns today and she tells me that one week ago she was in [place], [place] when she developed acute/severe RLQ abd pain. She tells me that she went to the ER and a CT scan was done that showed a stone on the right side and she had a UTI but that no antibiotics were given. The pain has resolved but she complains of dysuria. The only records I have today from that ER visit is a urine culture.  Urinary Tract Infection  This is a new problem. The current episode started in the past 7 days. The problem occurs every urination. The problem has been gradually improving. The quality of the pain is described as burning. The pain is at a severity of 1/10. The pain is mild. There has been no fever. The fever has been present for less than 1 day. She is not sexually active. There is no history of pyelonephritis. Pertinent negatives include no chills, discharge, flank pain, hematuria, hesitancy, nausea, possible pregnancy, sweats, urgency or vomiting. She has tried nothing for the symptoms. Her past medical history is significant for kidney stones.      Review of Systems  Constitutional: Negative.  Negative for fever, chills, diaphoresis, activity change, appetite change, fatigue and unexpected weight change.  HENT: Negative.   Eyes: Negative.   Respiratory: Negative.  Negative for cough, chest tightness, shortness of breath, wheezing and stridor.   Cardiovascular: Negative.  Negative for chest pain, palpitations and leg swelling.  Gastrointestinal: Negative.  Negative for nausea, vomiting, abdominal pain, diarrhea, constipation and blood in stool.  Endocrine: Negative.   Genitourinary: Positive for dysuria. Negative for hesitancy, urgency, hematuria, flank pain, enuresis, difficulty urinating, genital sores, pelvic pain and dyspareunia.  Musculoskeletal: Negative.   Skin: Negative.    Allergic/Immunologic: Negative.  Negative for environmental allergies.  Neurological: Negative.   Hematological: Negative.  Does not bruise/bleed easily.  Psychiatric/Behavioral: Negative.        Objective:   Physical Exam  Vitals reviewed. Constitutional: She is oriented to person, place, and time. She appears well-developed and well-nourished.  Non-toxic appearance. She does not have a sickly appearance. She does not appear ill. No distress.  HENT:  Head: Normocephalic and atraumatic.  Mouth/Throat: Oropharynx is clear and moist. No oropharyngeal exudate.  Eyes: Conjunctivae are normal. Right eye exhibits no discharge. Left eye exhibits no discharge. No scleral icterus.  Neck: Normal range of motion. Neck supple. No JVD present. No tracheal deviation present. No thyromegaly present.  Cardiovascular: Normal rate, regular rhythm, S1 normal, S2 normal and intact distal pulses.  Exam reveals no gallop, no S3, no S4 and no friction rub.   Murmur heard.  No systolic murmur is present   Diastolic murmur is present with a grade of 1/6  Pulmonary/Chest: Effort normal and breath sounds normal. No stridor. No respiratory distress. She has no wheezes. She has no rales. She exhibits no tenderness.  Abdominal: Soft. Normal appearance and bowel sounds are normal. She exhibits no distension and no mass. There is no hepatosplenomegaly, splenomegaly or hepatomegaly. There is no tenderness. There is no rigidity, no rebound, no guarding, no CVA tenderness and no tenderness at McBurney's point. No hernia. Hernia confirmed negative in the ventral area, confirmed negative in the right inguinal area and confirmed negative in the left inguinal area.  Musculoskeletal: Normal range of motion. She exhibits no edema and  no tenderness.  Lymphadenopathy:    She has no cervical adenopathy.  Neurological: She is oriented to person, place, and time.  Skin: Skin is warm and dry. No rash noted. She is not diaphoretic. No  erythema. No pallor.  Psychiatric: She has a normal mood and affect. Her behavior is normal. Judgment and thought content normal.     Lab Results  Component Value Date   WBC 6.7 07/06/2012   HGB 13.2 07/06/2012   HCT 40.6 07/06/2012   PLT 163.0 07/06/2012   GLUCOSE 129* 01/06/2013   CHOL 158 05/12/2012   TRIG 97.0 05/12/2012   HDL 45.70 05/12/2012   LDLCALC 93 05/12/2012   ALT 14 01/06/2013   AST 18 01/06/2013   NA 140 01/06/2013   K 4.1 01/06/2013   CL 103 01/06/2013   CREATININE 1.2 01/06/2013   BUN 15 01/06/2013   CO2 32 01/06/2013   TSH 1.66 12/04/2011   INR 1.9 09/15/2011   HGBA1C 6.9* 01/06/2013       Assessment & Plan:

## 2013-04-15 ENCOUNTER — Encounter: Payer: Self-pay | Admitting: Internal Medicine

## 2013-04-22 ENCOUNTER — Encounter: Payer: Self-pay | Admitting: Internal Medicine

## 2013-05-05 ENCOUNTER — Other Ambulatory Visit: Payer: Self-pay | Admitting: Internal Medicine

## 2013-05-18 ENCOUNTER — Ambulatory Visit: Payer: Medicare PPO | Admitting: Internal Medicine

## 2013-05-24 ENCOUNTER — Ambulatory Visit: Payer: Medicare PPO | Admitting: Internal Medicine

## 2013-05-24 ENCOUNTER — Telehealth: Payer: Self-pay | Admitting: Cardiovascular Disease

## 2013-05-24 DIAGNOSIS — I421 Obstructive hypertrophic cardiomyopathy: Secondary | ICD-10-CM

## 2013-05-24 NOTE — Telephone Encounter (Signed)
New Problem  Pt states that she has always had an echo before her Office visits. She asks if an ECHO is needed for the up and coming appointment as well. Please advise.

## 2013-05-25 NOTE — Telephone Encounter (Signed)
PT  AWARE./CY 

## 2013-05-25 NOTE — Telephone Encounter (Signed)
DOES  PT NEED  ECHO OR MRI ?

## 2013-05-25 NOTE — Telephone Encounter (Signed)
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

## 2013-05-26 ENCOUNTER — Other Ambulatory Visit (INDEPENDENT_AMBULATORY_CARE_PROVIDER_SITE_OTHER): Payer: Medicare PPO

## 2013-05-26 ENCOUNTER — Encounter: Payer: Self-pay | Admitting: Internal Medicine

## 2013-05-26 ENCOUNTER — Ambulatory Visit (INDEPENDENT_AMBULATORY_CARE_PROVIDER_SITE_OTHER): Payer: Medicare PPO | Admitting: Internal Medicine

## 2013-05-26 VITALS — BP 124/84 | HR 93 | Temp 97.9°F | Resp 16 | Ht 63.0 in | Wt 197.0 lb

## 2013-05-26 DIAGNOSIS — I10 Essential (primary) hypertension: Secondary | ICD-10-CM

## 2013-05-26 DIAGNOSIS — F3289 Other specified depressive episodes: Secondary | ICD-10-CM

## 2013-05-26 DIAGNOSIS — IMO0001 Reserved for inherently not codable concepts without codable children: Secondary | ICD-10-CM

## 2013-05-26 DIAGNOSIS — F329 Major depressive disorder, single episode, unspecified: Secondary | ICD-10-CM

## 2013-05-26 DIAGNOSIS — N259 Disorder resulting from impaired renal tubular function, unspecified: Secondary | ICD-10-CM

## 2013-05-26 DIAGNOSIS — E7800 Pure hypercholesterolemia, unspecified: Secondary | ICD-10-CM

## 2013-05-26 DIAGNOSIS — Z0000 Encounter for general adult medical examination without abnormal findings: Secondary | ICD-10-CM

## 2013-05-26 LAB — BASIC METABOLIC PANEL
CO2: 31 mEq/L (ref 19–32)
Calcium: 10 mg/dL (ref 8.4–10.5)
Creatinine, Ser: 1.3 mg/dL — ABNORMAL HIGH (ref 0.4–1.2)
GFR: 44.64 mL/min — ABNORMAL LOW (ref >60.00)
Sodium: 139 mEq/L (ref 135–145)

## 2013-05-26 LAB — LIPID PANEL
Cholesterol: 142 mg/dL (ref 0–200)
LDL Cholesterol: 79 mg/dL (ref 0–99)
Total CHOL/HDL Ratio: 3
VLDL: 15.8 mg/dL (ref 0.0–40.0)

## 2013-05-26 LAB — HEMOGLOBIN A1C: Hgb A1c MFr Bld: 7.4 % — ABNORMAL HIGH (ref 4.6–6.5)

## 2013-05-26 LAB — TSH: TSH: 1.3 u[IU]/mL (ref 0.35–5.50)

## 2013-05-26 MED ORDER — LINAGLIPTIN 5 MG PO TABS: 5.0000 mg | ORAL_TABLET | Freq: Every day | ORAL | Status: DC

## 2013-05-26 NOTE — Assessment & Plan Note (Addendum)

## 2013-05-26 NOTE — Assessment & Plan Note (Signed)
Goal achieved 

## 2013-05-26 NOTE — Assessment & Plan Note (Signed)
Her BP is well controlled 

## 2013-05-26 NOTE — Patient Instructions (Signed)
Type 2 Diabetes Mellitus, Adult Type 2 diabetes mellitus, often simply referred to as type 2 diabetes, is a long-lasting (chronic) disease. In type 2 diabetes, the pancreas does not make enough insulin (a hormone), the cells are less responsive to the insulin that is made (insulin resistance), or both. Normally, insulin moves sugars from food into the tissue cells. The tissue cells use the sugars for energy. The lack of insulin or the lack of normal response to insulin causes excess sugars to build up in the blood instead of going into the tissue cells. As a result, high blood sugar (hyperglycemia) develops. The effect of high sugar (glucose) levels can cause many complications. Type 2 diabetes was also previously called adult-onset diabetes but it can occur at any age.  RISK FACTORS  A person is predisposed to developing type 2 diabetes if someone in the family has the disease and also has one or more of the following primary risk factors:  Overweight.  An inactive lifestyle.  A history of consistently eating high-calorie foods. Maintaining a normal weight and regular physical activity can reduce the chance of developing type 2 diabetes. SYMPTOMS  A person with type 2 diabetes may not show symptoms initially. The symptoms of type 2 diabetes appear slowly. The symptoms include:  Increased thirst (polydipsia).  Increased urination (polyuria).  Increased urination during the night (nocturia).  Weight loss. This weight loss may be rapid.  Frequent, recurring infections.  Tiredness (fatigue).  Weakness.  Vision changes, such as blurred vision.  Fruity smell to your breath.  Abdominal pain.  Nausea or vomiting.  Cuts or bruises which are slow to heal.  Tingling or numbness in the hands or feet. DIAGNOSIS Type 2 diabetes is frequently not diagnosed until complications of diabetes are present. Type 2 diabetes is diagnosed when symptoms or complications are present and when blood  glucose levels are increased. Your blood glucose level may be checked by one or more of the following blood tests:  A fasting blood glucose test. You will not be allowed to eat for at least 8 hours before a blood sample is taken.  A random blood glucose test. Your blood glucose is checked at any time of the day regardless of when you ate.  A hemoglobin A1c blood glucose test. A hemoglobin A1c test provides information about blood glucose control over the previous 3 months.  An oral glucose tolerance test (OGTT). Your blood glucose is measured after you have not eaten (fasted) for 2 hours and then after you drink a glucose-containing beverage. TREATMENT   You may need to take insulin or diabetes medicine daily to keep blood glucose levels in the desired range.  You will need to match insulin dosing with exercise and healthy food choices. The treatment goal is to maintain the before meal blood sugar (preprandial glucose) level at 70 130 mg/dL. HOME CARE INSTRUCTIONS   Have your hemoglobin A1c level checked twice a year.  Perform daily blood glucose monitoring as directed by your caregiver.  Monitor urine ketones when you are ill and as directed by your caregiver.  Take your diabetes medicine or insulin as directed by your caregiver to maintain your blood glucose levels in the desired range.  Never run out of diabetes medicine or insulin. It is needed every day.  Adjust insulin based on your intake of carbohydrates. Carbohydrates can raise blood glucose levels but need to be included in your diet. Carbohydrates provide vitamins, minerals, and fiber which are an essential part of   a healthy diet. Carbohydrates are found in fruits, vegetables, whole grains, dairy products, legumes, and foods containing added sugars.    Eat healthy foods. Alternate 3 meals with 3 snacks.  Lose weight if overweight.  Carry a medical alert card or wear your medical alert jewelry.  Carry a 15 gram  carbohydrate snack with you at all times to treat low blood glucose (hypoglycemia). Some examples of 15 gram carbohydrate snacks include:  Glucose tablets, 3 or 4   Glucose gel, 15 gram tube  Raisins, 2 tablespoons (24 grams)  Jelly beans, 6  Animal crackers, 8  Regular pop, 4 ounces (120 mL)  Gummy treats, 9  Recognize hypoglycemia. Hypoglycemia occurs with blood glucose levels of 70 mg/dL and below. The risk for hypoglycemia increases when fasting or skipping meals, during or after intense exercise, and during sleep. Hypoglycemia symptoms can include:  Tremors or shakes.  Decreased ability to concentrate.  Sweating.  Increased heart rate.  Headache.  Dry mouth.  Hunger.  Irritability.  Anxiety.  Restless sleep.  Altered speech or coordination.  Confusion.  Treat hypoglycemia promptly. If you are alert and able to safely swallow, follow the 15:15 rule:  Take 15 20 grams of rapid-acting glucose or carbohydrate. Rapid-acting options include glucose gel, glucose tablets, or 4 ounces (120 mL) of fruit juice, regular soda, or low fat milk.  Check your blood glucose level 15 minutes after taking the glucose.  Take 15 20 grams more of glucose if the repeat blood glucose level is still 70 mg/dL or below.  Eat a meal or snack within 1 hour once blood glucose levels return to normal.    Be alert to polyuria and polydipsia which are early signs of hyperglycemia. An early awareness of hyperglycemia allows for prompt treatment. Treat hyperglycemia as directed by your caregiver.  Engage in at least 150 minutes of moderate-intensity physical activity a week, spread over at least 3 days of the week or as directed by your caregiver. In addition, you should engage in resistance exercise at least 2 times a week or as directed by your caregiver.  Adjust your medicine and food intake as needed if you start a new exercise or sport.  Follow your sick day plan at any time you  are unable to eat or drink as usual.  Avoid tobacco use.  Limit alcohol intake to no more than 1 drink per day for nonpregnant women and 2 drinks per day for men. You should drink alcohol only when you are also eating food. Talk with your caregiver whether alcohol is safe for you. Tell your caregiver if you drink alcohol several times a week.  Follow up with your caregiver regularly.  Schedule an eye exam soon after the diagnosis of type 2 diabetes and then annually.  Perform daily skin and foot care. Examine your skin and feet daily for cuts, bruises, redness, nail problems, bleeding, blisters, or sores. A foot exam by a caregiver should be done annually.  Brush your teeth and gums at least twice a day and floss at least once a day. Follow up with your dentist regularly.  Share your diabetes management plan with your workplace or school.  Stay up-to-date with immunizations.  Learn to manage stress.  Obtain ongoing diabetes education and support as needed.  Participate in, or seek rehabilitation as needed to maintain or improve independence and quality of life. Request a physical or occupational therapy referral if you are having foot or hand numbness or difficulties with grooming,   dressing, eating, or physical activity. SEEK MEDICAL CARE IF:   You are unable to eat food or drink fluids for more than 6 hours.  You have nausea and vomiting for more than 6 hours.  Your blood glucose level is over 240 mg/dL.  There is a change in mental status.  You develop an additional serious illness.  You have diarrhea for more than 6 hours.  You have been sick or have had a fever for a couple of days and are not getting better.  You have pain during any physical activity.  SEEK IMMEDIATE MEDICAL CARE IF:  You have difficulty breathing.  You have moderate to large ketone levels. MAKE SURE YOU:  Understand these instructions.  Will watch your condition.  Will get help right away if  you are not doing well or get worse. Document Released: 07/21/2005 Document Revised: 04/14/2012 Document Reviewed: 02/17/2012 ExitCare Patient Information 2014 [employer].  

## 2013-05-26 NOTE — Assessment & Plan Note (Signed)
Her renal function is stable 

## 2013-05-26 NOTE — Assessment & Plan Note (Signed)
Her A1C has gone up She will start tradjenta

## 2013-05-26 NOTE — Progress Notes (Signed)
Subjective:    Patient ID: Holly Simmons, female    DOB: 1941/07/24, 72 y.o.   MRN: [id number]  Diabetes She presents for her follow-up diabetic visit. She has type 2 diabetes mellitus. Her disease course has been worsening. Pertinent negatives for hypoglycemia include no dizziness, speech difficulty or tremors. Associated symptoms include blurred vision. Pertinent negatives for diabetes include no chest pain, no fatigue, no foot paresthesias, no foot ulcerations, no polydipsia, no polyphagia, no polyuria, no visual change, no weakness and no weight loss. There are no hypoglycemic complications. Diabetic complications include nephropathy. Current diabetic treatment includes diet. She is compliant with treatment none of the time. Her weight is stable. She is following a generally unhealthy diet. When asked about meal planning, she reported none. She has not had a previous visit with a dietician. She never participates in exercise. Her home blood glucose trend is increasing steadily. She does not see a podiatrist.Eye exam is current.      Review of Systems  Constitutional: Negative.  Negative for fever, chills, weight loss, diaphoresis, appetite change and fatigue.  HENT: Negative.   Eyes: Positive for blurred vision and visual disturbance (intermittent changes in her [place]).  Respiratory: Negative.  Negative for cough, chest tightness, shortness of breath, wheezing and stridor.   Cardiovascular: Negative.  Negative for chest pain, palpitations and leg swelling.  Gastrointestinal: Negative.  Negative for nausea, vomiting, abdominal pain, diarrhea, constipation and blood in stool.  Endocrine: Negative.  Negative for polydipsia, polyphagia and polyuria.  Musculoskeletal: Negative.  Negative for arthralgias, joint swelling and myalgias.  Skin: Negative.   Allergic/Immunologic: Negative.   Neurological: Negative.  Negative for dizziness, tremors, speech difficulty, weakness and light-headedness.   Hematological: Negative.  Negative for adenopathy. Does not bruise/bleed easily.  Psychiatric/Behavioral: Negative.        Objective:   Physical Exam  Vitals reviewed. Constitutional: She is oriented to person, place, and time. She appears well-developed and well-nourished. No distress.  HENT:  Head: Normocephalic and atraumatic.  Mouth/Throat: Oropharynx is clear and moist. No oropharyngeal exudate.  Eyes: Conjunctivae are normal. Right eye exhibits no discharge. Left eye exhibits no discharge. No scleral icterus.  Neck: Normal range of motion. Neck supple. No JVD present. No tracheal deviation present. No thyromegaly present.  Cardiovascular: Normal rate, regular rhythm and intact distal pulses.  Exam reveals no gallop, no S3, no S4 and no friction rub.   Murmur heard.  Decrescendo systolic murmur is present with a grade of 1/6   Decrescendo diastolic murmur is present with a grade of 1/6  Pulmonary/Chest: Effort normal and breath sounds normal. No stridor. No respiratory distress. She has no wheezes. She has no rales. She exhibits no tenderness.  Abdominal: Soft. Bowel sounds are normal. She exhibits no distension and no mass. There is no tenderness. There is no rebound and no guarding.  Musculoskeletal: Normal range of motion. She exhibits no edema and no tenderness.  Lymphadenopathy:    She has no cervical adenopathy.  Neurological: She is oriented to person, place, and time.  Skin: Skin is warm and dry. No rash noted. She is not diaphoretic. No erythema. No pallor.  Psychiatric: She has a normal mood and affect. Her behavior is normal. Judgment and thought content normal.     Lab Results  Component Value Date   WBC 6.7 07/06/2012   HGB 13.2 07/06/2012   HCT 40.6 07/06/2012   PLT 163.0 07/06/2012   GLUCOSE 130* 04/14/2013   CHOL 158 05/12/2012   TRIG  97.0 05/12/2012   HDL 45.70 05/12/2012   LDLCALC 93 05/12/2012   ALT 14 01/06/2013   AST 18 01/06/2013   NA 137 04/14/2013   K 3.9  04/14/2013   CL 100 04/14/2013   CREATININE 1.3* 04/14/2013   BUN 15 04/14/2013   CO2 30 04/14/2013   TSH 1.66 12/04/2011   INR 1.9 09/15/2011   HGBA1C 6.9* 01/06/2013       Assessment & Plan:

## 2013-06-03 NOTE — Telephone Encounter (Signed)
ECHO SCHEDULED  FOR  08-09-13 AT 10;30 AND  TO SEE DR Johnsie Cancel AFTER  AT  11;30  PT  NOTIFIED./CY

## 2013-07-20 ENCOUNTER — Encounter: Payer: Self-pay | Admitting: Internal Medicine

## 2013-07-20 ENCOUNTER — Ambulatory Visit (INDEPENDENT_AMBULATORY_CARE_PROVIDER_SITE_OTHER): Payer: Medicare PPO | Admitting: Internal Medicine

## 2013-07-20 VITALS — BP 118/82 | HR 89 | Ht 63.0 in | Wt 188.8 lb

## 2013-07-20 DIAGNOSIS — Z950 Presence of cardiac pacemaker: Secondary | ICD-10-CM

## 2013-07-20 DIAGNOSIS — I10 Essential (primary) hypertension: Secondary | ICD-10-CM

## 2013-07-20 DIAGNOSIS — I442 Atrioventricular block, complete: Secondary | ICD-10-CM

## 2013-07-20 DIAGNOSIS — I495 Sick sinus syndrome: Secondary | ICD-10-CM

## 2013-07-20 DIAGNOSIS — Z7901 Long term (current) use of anticoagulants: Secondary | ICD-10-CM

## 2013-07-20 NOTE — Patient Instructions (Signed)
Your physician wants you to follow-up in: 12 months with Dr Vallery Ridge will receive a reminder letter in the mail two months in advance. If you don't receive a letter, please call our office to schedule the follow-up appointment.    Remote monitoring is used to monitor your Pacemaker or ICD from home. This monitoring reduces the number of office visits required to check your device to one time per year. It allows [place] to keep an eye on the functioning of your device to ensure it is working properly. You are scheduled for a device check from home on 10/21/13. You may send your transmission at any time that day. If you have a wireless device, the transmission will be sent automatically. After your physician reviews your transmission, you will receive a postcard with your next transmission date.

## 2013-07-21 LAB — MDC_IDC_ENUM_SESS_TYPE_INCLINIC
Battery Impedance: 113 Ohm
Battery Remaining Longevity: 123 mo
Battery Voltage: 2.8 V
Brady Statistic AP VS Percent: 0 %
Brady Statistic AS VP Percent: 0 %
Date Time Interrogation Session: 20141217175306
Lead Channel Impedance Value: 518 Ohm
Lead Channel Pacing Threshold Amplitude: 0.5 V
Lead Channel Pacing Threshold Pulse Width: 0.4 ms
Lead Channel Setting Pacing Amplitude: 2.5 V
Lead Channel Setting Pacing Pulse Width: 0.4 ms

## 2013-07-22 ENCOUNTER — Encounter: Payer: Self-pay | Admitting: Internal Medicine

## 2013-07-22 ENCOUNTER — Encounter: Payer: Medicare PPO | Admitting: Internal Medicine

## 2013-07-29 DIAGNOSIS — I442 Atrioventricular block, complete: Secondary | ICD-10-CM | POA: Insufficient documentation

## 2013-07-29 NOTE — Progress Notes (Signed)
PCP:  Scarlette Calico, MD Primary Cardiologist:  Dr Johnsie Cancel  The patient presents today for routine electrophysiology followup.  Since last being seen in our clinic, the patient reports doing very well.   Today, she denies symptoms of palpitations, chest pain, shortness of breath, orthopnea, PND, lower extremity edema, dizziness, presyncope, or syncope.  The patient feels that she is tolerating medications without difficulties and is otherwise without complaint today.   Past Medical History  Diagnosis Date  . Hypertension   . Hyperpotassemia   . Asthma     NOS w/ acute exacerbation  . Hypersomnia   . Depressive disorder   . Diastolic dysfunction   . Pyuria   . Renal insufficiency   . GERD (gastroesophageal reflux disease)   . COPD (chronic obstructive pulmonary disease)   . Obstructive sleep apnea     persistent daytime sleepiness despite cpap  . Allergic rhinitis   . Psoriasis   . Hypertrophic cardiomyopathy     s/p surgical myomectomy at [facility] 0000000 complicated by septal VSD post procedure requiring reoperation with patch closure and tricuspid valve replacement  . 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 Complete heart block     requiring PPM (MDT) post surgical myomectomy at [facility],  leads are epicardial with abdominal implant, high ventricular threshold at implant  . Myocardial infarction   . Borderline diabetes   . Heart murmur   . 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 Gout 08/20/2012  . Colon polyps     TUBULAR ADENOMAS AND HYPERPLASTIC   Past Surgical History  Procedure Laterality Date  . Appendectomy    . Cholecystectomy    . Tonsillectomy    . Cesarean section    . Abdominal hysterectomy    . Septal myomectomy for hypertrophic cm  06/23/11    by Dr Evelina Dun at [facility], complicated by septal VSD requiring patch repair and tricuspid valve replacement  . 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 Vsd repair  06/2011  . Pacemaker insertion   07/02/11    epicardial wires with abdominal implant at [place] 12/12,  high ventricular lead threshold at implant per Dr Westley Gambles  . Insert / replace / remove pacemaker  07/13/2012    Current Outpatient Prescriptions  Medication Sig Dispense Refill  . allopurinol (ZYLOPRIM) 100 MG tablet Take 100 mg by mouth daily. When prednisone runs out pt will begin this med      . ampicillin (PRINCIPEN) 500 MG capsule Take 1 capsule (500 mg total) by mouth 4 (four) times daily.  40 capsule  1  . Armodafinil (NUVIGIL) 150 MG tablet Take 1 tablet (150 mg total) by mouth as needed.  30 tablet  5  . aspirin 81 MG tablet Take 81 mg by mouth daily.        . B Complex-C (B-COMPLEX WITH VITAMIN C) tablet Take 1 tablet by mouth daily.      . budesonide-formoterol (SYMBICORT) 160-4.5 MCG/ACT inhaler Inhale 2 puffs into the lungs 2 (two) times daily.        Marland Kitchen buPROPion (WELLBUTRIN XL) 150 MG 24 hr tablet Take one tablet by mouth one time daily  90 tablet  3  . fexofenadine (ALLEGRA) 180 MG tablet Take 180 mg by mouth daily. prn       . furosemide (LASIX) 40 MG tablet One by mouth every other day  30 tablet  2  . hydrocortisone (ANUSOL-HC) 25 MG suppository Place  1 suppository (25 mg total) rectally at bedtime.  12 suppository  1  . linagliptin (TRADJENTA) 5 MG TABS tablet Take 1 tablet (5 mg total) by mouth daily.  84 tablet  0  . LUXIQ 0.12 % foam APPLY TO SCALP AS DIRECTED  100 g  11  . metoprolol succinate (TOPROL-XL) 100 MG 24 hr tablet Take 1 tablet (100 mg total) by mouth daily.  90 tablet  3  . Multiple Vitamin (MULTIVITAMIN) tablet Take 1 tablet by mouth daily.      . ondansetron (ZOFRAN) 4 MG tablet Take 1 tablet by mouth as needed.      Marland Kitchen oxyCODONE-acetaminophen (PERCOCET/ROXICET) 5-325 MG per tablet Take 1 tablet by mouth every 6 (six) hours as needed for pain.      . ranitidine (ZANTAC) 300 MG tablet Take one tablet by mouth   nightly at bedtime  90 tablet  3  . spironolactone (ALDACTONE) 25 MG tablet Take  0.5 tablets (12.5 mg total) by mouth daily.  90 tablet  3  . tamsulosin (FLOMAX) 0.4 MG CAPS capsule Take 1 capsule by mouth daily. 1/2 hr prior to meal      . triamcinolone cream (KENALOG) 0.5 % Apply topically 3 (three) times daily.  30 g  3  . Vilazodone HCl (VIIBRYD) 40 MG TABS Take 1 tablet by mouth daily.  140 tablet  0  . [DISCONTINUED] Alum & Mag Hydroxide-Simeth (MAGIC MOUTHWASH) SOLN Take 5 mLs by mouth 3 (three) times daily. 14 days then stop        No current facility-administered medications for this visit.    Allergies  Allergen Reactions  . Sulfonamide Derivatives Anaphylaxis and Swelling  . Tetanus Toxoids Swelling  . Other Rash    STERI - STRIPS   . Nsaids     REACTION: proteinuria    History   Social History  . Marital Status: Married    Spouse Name: [name]    Number of Children: 2  . Years of Education: N/A   Occupational History  .     Social History Main Topics  . Smoking status: Never Smoker   . Smokeless tobacco: Never Used  . Alcohol Use: No  . Drug Use: No  . Sexual Activity: Not Currently   Other Topics Concern  . Not on file   Social History Narrative  . No narrative on file    Family History  Problem Relation Age of Onset  . Cancer Father     bladder  . Cancer Maternal Aunt     ovarian  . Heart disease Maternal Grandfather   . Heart disease Paternal Grandfather   . Cancer Other     breast  . Dementia Other   . Hypertension Other    Physical Exam: Filed Vitals:   07/20/13 1140  BP: 118/82  Pulse: 89  Height: 5\' 3"  (1.6 m)  Weight: 188 lb 12 oz (85.616 kg)    GEN- The patient is overweight appearing, alert and oriented x 3 today.   Head- normocephalic, atraumatic Eyes-  Sclera clear, conjunctiva pink Ears- hearing intact Oropharynx- clear Neck- supple, Lungs- Clear to ausculation bilaterally, normal work of breathing Abd- pacemaker pocket is well healed Heart- Regular rate and rhythm, 2/6 SEM LUSB, 1/6 diastolic  murmur LUSB GI- soft, NT, ND, + BS Extremities- no clubbing, cyanosis, or edema  ekg today reveals AV sequential pacing  Pacemaker interrogation- reviewed in detail today,  See PACEART report  Assessment and Plan:  1.  Complete heart block Normal pacemaker function See Pace Art report No changes today Her abdominal pacemaker is also interrogated today.    2. HTN Stable No change required today  carelink Return in 1 year

## 2013-08-02 ENCOUNTER — Other Ambulatory Visit: Payer: Self-pay | Admitting: *Deleted

## 2013-08-02 DIAGNOSIS — IMO0001 Reserved for inherently not codable concepts without codable children: Secondary | ICD-10-CM

## 2013-08-02 DIAGNOSIS — R0602 Shortness of breath: Secondary | ICD-10-CM

## 2013-08-02 DIAGNOSIS — I5032 Chronic diastolic (congestive) heart failure: Secondary | ICD-10-CM

## 2013-08-02 DIAGNOSIS — I10 Essential (primary) hypertension: Secondary | ICD-10-CM

## 2013-08-02 MED ORDER — SPIRONOLACTONE 25 MG PO TABS: 12.5000 mg | ORAL_TABLET | Freq: Every day | ORAL | Status: DC

## 2013-08-02 MED ORDER — ALLOPURINOL 100 MG PO TABS: 100.0000 mg | ORAL_TABLET | Freq: Every day | ORAL | Status: DC

## 2013-08-02 MED ORDER — FUROSEMIDE 40 MG PO TABS: ORAL_TABLET | ORAL | Status: DC

## 2013-08-02 MED ORDER — LINAGLIPTIN 5 MG PO TABS: 5.0000 mg | ORAL_TABLET | Freq: Every day | ORAL | Status: DC

## 2013-08-03 ENCOUNTER — Encounter: Payer: Medicare PPO | Admitting: Internal Medicine

## 2013-08-03 ENCOUNTER — Other Ambulatory Visit: Payer: Self-pay | Admitting: Internal Medicine

## 2013-08-05 ENCOUNTER — Other Ambulatory Visit (HOSPITAL_COMMUNITY): Payer: Medicare PPO

## 2013-08-08 ENCOUNTER — Ambulatory Visit (INDEPENDENT_AMBULATORY_CARE_PROVIDER_SITE_OTHER): Payer: Medicare PPO | Admitting: Family Medicine

## 2013-08-08 ENCOUNTER — Encounter: Payer: Self-pay | Admitting: Family Medicine

## 2013-08-08 VITALS — BP 108/72 | HR 73 | Temp 98.2°F | Resp 18 | Ht 63.0 in | Wt 184.0 lb

## 2013-08-08 DIAGNOSIS — R69 Illness, unspecified: Principal | ICD-10-CM

## 2013-08-08 DIAGNOSIS — J111 Influenza due to unidentified influenza virus with other respiratory manifestations: Secondary | ICD-10-CM

## 2013-08-08 MED ORDER — OSELTAMIVIR PHOSPHATE 30 MG PO CAPS: 30.0000 mg | ORAL_CAPSULE | Freq: Two times a day (BID) | ORAL | Status: DC

## 2013-08-08 MED ORDER — BENZONATATE 200 MG PO CAPS: 200.0000 mg | ORAL_CAPSULE | Freq: Two times a day (BID) | ORAL | Status: DC | PRN

## 2013-08-08 NOTE — Progress Notes (Signed)
Pre visit review using our clinic review tool, if applicable. No additional management support is needed unless otherwise documented below in the visit note. 

## 2013-08-08 NOTE — Progress Notes (Signed)
OFFICE NOTE  08/08/2013  CC:  Chief Complaint  Patient presents with  . Cough    x Saturday  . Fever     HPI: Patient is a 73 y.o. Caucasian female who is here for fever and cough. Onset 36h ago, ST, nasal mucous, coughing a lot, subjective f/c. Tessalon perles did help.  No tyl or motrin b/c of fears it will hurt kidneys.   She did get a flu vaccine this season. Never smoker but lots of second hand smoke exposure.   Pertinent PMH:  Past Medical History  Diagnosis Date  . Hypertension   . Hyperpotassemia   . Asthma     NOS w/ acute exacerbation  . Hypersomnia   . Depressive disorder   . Diastolic dysfunction   . Pyuria   . Renal insufficiency   . GERD (gastroesophageal reflux disease)   . COPD (chronic obstructive pulmonary disease)   . Obstructive sleep apnea     persistent daytime sleepiness despite cpap  . Allergic rhinitis   . Psoriasis   . Hypertrophic cardiomyopathy     s/p surgical myomectomy at [facility] 0000000 complicated by septal VSD post procedure requiring reoperation with patch closure and tricuspid valve replacement  . Tricuspid valve replaced     MDT 25mm Mosaic Valve  . Complete heart block     requiring PPM (MDT) post surgical myomectomy at [facility],  leads are epicardial with abdominal implant, high ventricular threshold at implant  . Myocardial infarction   . Borderline diabetes   . Heart murmur   . 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 Gout 08/20/2012  . Colon polyps     TUBULAR ADENOMAS AND HYPERPLASTIC    MEDS: Not taking ampicillin as listed below Outpatient Prescriptions Prior to Visit  Medication Sig Dispense Refill  . allopurinol (ZYLOPRIM) 100 MG tablet Take 1 tablet (100 mg total) by mouth daily.  30 tablet  3  . ampicillin (PRINCIPEN) 500 MG capsule Take 1 capsule (500 mg total) by mouth 4 (four) times daily.  40 capsule  1  . Armodafinil (NUVIGIL) 150 MG tablet Take 1 tablet (150 mg total) by mouth as needed.  30 tablet  5  . aspirin 81 MG tablet Take  81 mg by mouth daily.        . B Complex-C (B-COMPLEX WITH VITAMIN C) tablet Take 1 tablet by mouth daily.      . budesonide-formoterol (SYMBICORT) 160-4.5 MCG/ACT inhaler Inhale 2 puffs into the lungs 2 (two) times daily.        Marland Kitchen buPROPion (WELLBUTRIN XL) 150 MG 24 hr tablet Take one tablet by mouth one time daily  90 tablet  3  . fexofenadine (ALLEGRA) 180 MG tablet Take 180 mg by mouth daily. prn       . furosemide (LASIX) 40 MG tablet One by mouth every other day  30 tablet  2  . hydrocortisone (ANUSOL-HC) 25 MG suppository Place 1 suppository (25 mg total) rectally at bedtime.  12 suppository  1  . linagliptin (TRADJENTA) 5 MG TABS tablet Take 1 tablet (5 mg total) by mouth daily.  30 tablet  3  . LUXIQ 0.12 % foam APPLY TO SCALP AS DIRECTED  100 g  11  . metoprolol succinate (TOPROL-XL) 100 MG 24 hr tablet Take one tablet by mouth one time daily  90 tablet  2  . Multiple Vitamin (MULTIVITAMIN) tablet Take 1 tablet by mouth daily.      . ondansetron (ZOFRAN) 4 MG tablet Take  1 tablet by mouth as needed.      Marland Kitchen oxyCODONE-acetaminophen (PERCOCET/ROXICET) 5-325 MG per tablet Take 1 tablet by mouth every 6 (six) hours as needed for pain.      . ranitidine (ZANTAC) 300 MG tablet Take one tablet by mouth   nightly at bedtime  90 tablet  3  . spironolactone (ALDACTONE) 25 MG tablet Take 0.5 tablets (12.5 mg total) by mouth daily.  90 tablet  3  . tamsulosin (FLOMAX) 0.4 MG CAPS capsule Take 1 capsule by mouth daily. 1/2 hr prior to meal      . triamcinolone cream (KENALOG) 0.5 % Apply topically 3 (three) times daily.  30 g  3  . Vilazodone HCl (VIIBRYD) 40 MG TABS Take 1 tablet by mouth daily.  140 tablet  0   No facility-administered medications prior to visit.    PE: Blood pressure 108/72, pulse 73, temperature 98.2 F (36.8 C), temperature source Temporal, resp. rate 18, height 5\' 3"  (1.6 m), weight 184 lb (83.462 kg), SpO2 94.00%. VS: noted--normal. Gen: alert, NAD, NONTOXIC  APPEARING. HEENT: eyes without injection, drainage, or swelling.  Ears: EACs clear, TMs with normal light reflex and landmarks.  Nose: Clear rhinorrhea, with some dried, crusty exudate adherent to mildly injected mucosa.  No purulent d/c.  No paranasal sinus TTP.  No facial swelling.  Throat and mouth without focal lesion.  No pharyngial swelling, erythema, or exudate.   Neck: supple, no LAD.   LUNGS: CTA bilat, nonlabored resps.   CV: RRR, soft systolic murmur EXT: no c/c/e SKIN: no rash  LAB: none today  IMPRESSION AND PLAN:  Influenza-like illness Tamiflu 30mg  bid x 5d. Tessalon perles 200mg  bid prn, #30, no Rf. Rest, clear fluids. Will assist pt in rescheduling her cardiologist visit/testing scheduled for tomorrow.   An After Visit Summary was printed and given to the patient.  FOLLOW UP: prn

## 2013-08-08 NOTE — Assessment & Plan Note (Signed)
Tamiflu 30mg  bid x 5d. Tessalon perles 200mg  bid prn, #30, no Rf. Rest, clear fluids. Will assist pt in rescheduling her cardiologist visit/testing scheduled for tomorrow.

## 2013-08-09 ENCOUNTER — Ambulatory Visit: Payer: Medicare PPO | Admitting: Cardiovascular Disease

## 2013-08-09 ENCOUNTER — Other Ambulatory Visit (HOSPITAL_COMMUNITY): Payer: Medicare PPO

## 2013-08-31 ENCOUNTER — Encounter: Payer: Self-pay | Admitting: Cardiovascular Disease

## 2013-08-31 ENCOUNTER — Ambulatory Visit (HOSPITAL_COMMUNITY): Payer: Medicare PPO | Attending: Cardiology | Admitting: Radiology

## 2013-08-31 ENCOUNTER — Ambulatory Visit (INDEPENDENT_AMBULATORY_CARE_PROVIDER_SITE_OTHER): Payer: Medicare PPO | Admitting: Cardiovascular Disease

## 2013-08-31 ENCOUNTER — Encounter: Payer: Self-pay | Admitting: Cardiology

## 2013-08-31 ENCOUNTER — Encounter: Payer: Self-pay | Admitting: Gastroenterology

## 2013-08-31 VITALS — BP 110/60 | HR 79 | Ht 63.0 in | Wt 181.0 lb

## 2013-08-31 DIAGNOSIS — I442 Atrioventricular block, complete: Secondary | ICD-10-CM | POA: Insufficient documentation

## 2013-08-31 DIAGNOSIS — E119 Type 2 diabetes mellitus without complications: Secondary | ICD-10-CM | POA: Insufficient documentation

## 2013-08-31 DIAGNOSIS — I10 Essential (primary) hypertension: Secondary | ICD-10-CM | POA: Insufficient documentation

## 2013-08-31 DIAGNOSIS — Q2542 Hypoplasia of aorta: Secondary | ICD-10-CM

## 2013-08-31 DIAGNOSIS — I079 Rheumatic tricuspid valve disease, unspecified: Secondary | ICD-10-CM

## 2013-08-31 DIAGNOSIS — I421 Obstructive hypertrophic cardiomyopathy: Secondary | ICD-10-CM

## 2013-08-31 DIAGNOSIS — J4489 Other specified chronic obstructive pulmonary disease: Secondary | ICD-10-CM | POA: Insufficient documentation

## 2013-08-31 DIAGNOSIS — J449 Chronic obstructive pulmonary disease, unspecified: Secondary | ICD-10-CM | POA: Insufficient documentation

## 2013-08-31 DIAGNOSIS — Q210 Ventricular septal defect: Secondary | ICD-10-CM

## 2013-08-31 DIAGNOSIS — I369 Nonrheumatic tricuspid valve disorder, unspecified: Secondary | ICD-10-CM

## 2013-08-31 DIAGNOSIS — I4891 Unspecified atrial fibrillation: Secondary | ICD-10-CM

## 2013-08-31 DIAGNOSIS — I071 Rheumatic tricuspid insufficiency: Secondary | ICD-10-CM

## 2013-08-31 DIAGNOSIS — I359 Nonrheumatic aortic valve disorder, unspecified: Secondary | ICD-10-CM | POA: Insufficient documentation

## 2013-08-31 DIAGNOSIS — G4733 Obstructive sleep apnea (adult) (pediatric): Secondary | ICD-10-CM | POA: Insufficient documentation

## 2013-08-31 DIAGNOSIS — Q254 Congenital malformation of aorta unspecified: Secondary | ICD-10-CM

## 2013-08-31 NOTE — Progress Notes (Signed)
Patient ID: Holly Simmons, female   DOB: October 27, 1940, 73 y.o.   MRN: [id number] She has a fairly complex history. She has a history of HOCM and underwent surgery at [facility] recently. She was seen in followup by Dr. Rayann Heman 11/15/11 . She underwent myomectomy at [facility] 06/23/11. 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 She underwent cardioversion for atrial flutter and was placed on Coumadin and amiodarone. Epicardial leads are known to have a high threshold.  Echocardiogram 08/27/11: Status post septal myomectomy complicated by VSD now with VSD patch repair-patch covers the basal septal wall and leads to akinesis of the wall within that area, small to moderate residual VSD at the apical end of the patch, EF 50%, moderate diastolic dysfunction, no LVOT or SAM, trivial MR, TR with mildly elevated mean gradient.  Maintaining sinus rhythm and her amiodarone and Coumadin were both discontinued.  Subsequently had new pacer placed last week. Could not cross TVR for stable lead but got good threshholds in CS. Interogated with Amber and good thresholds. Pacer site a little swollen but no signs of infection. Possibly mild diaphragmatic pacing but patient not sure   Reviewed echo from today and VSD has closed with overall good EF and normal funcitoning TVR  No SAM      ROS: Denies fever, malais, weight loss, blurry vision, decreased visual acuity, cough, sputum, SOB, hemoptysis, pleuritic pain, palpitaitons, heartburn, abdominal pain, melena, lower extremity edema, claudication, or rash.  All other systems reviewed and negative  General: Affect appropriate Healthy:  appears stated age HEENT: normal Neck supple with no adenopathy JVP normal no bruits no thyromegaly Lungs clear with no wheezing and good diaphragmatic motion Heart:  S1/S2 ?  SEM increases with inspiration , no rub, gallop or click PMI normal Abdomen: benighn, BS positve, no tenderness, no AAA no bruit.  No HSM or HJR Distal pulses intact with no bruits No edema Neuro non-focal Skin warm and dry No muscular weakness   Current Outpatient Prescriptions  Medication Sig Dispense Refill  . allopurinol (ZYLOPRIM) 100 MG tablet Take 1 tablet (100 mg total) by mouth daily.  30 tablet  3  . ampicillin (PRINCIPEN) 500 MG capsule Take 1 capsule (500 mg total) by mouth 4 (four) times daily.  40 capsule  1  . Armodafinil (NUVIGIL) 150 MG tablet Take 1 tablet (150 mg total) by mouth as needed.  30 tablet  5  . aspirin 81 MG tablet Take 81 mg by mouth daily.        . B Complex-C (B-COMPLEX WITH VITAMIN C) tablet Take 1 tablet by mouth daily.      . benzonatate (TESSALON) 200 MG capsule Take 1 capsule (200 mg total) by mouth 2 (two) times daily as needed for cough.  30 capsule  0  . budesonide-formoterol (SYMBICORT) 160-4.5 MCG/ACT inhaler Inhale 2 puffs into the lungs 2 (two) times daily.        Marland Kitchen buPROPion (WELLBUTRIN XL) 150 MG 24 hr tablet Take one tablet by mouth one time daily  90 tablet  3  . fexofenadine (ALLEGRA) 180 MG tablet Take 180 mg by mouth daily. prn       . furosemide (LASIX) 40 MG tablet One by mouth every other day  30 tablet  2  . hydrocortisone (ANUSOL-HC) 25 MG suppository Place 1 suppository (25  mg total) rectally at bedtime.  12 suppository  1  . linagliptin (TRADJENTA) 5 MG TABS tablet Take 1 tablet (5 mg total) by mouth daily.  30 tablet  3  . LUXIQ 0.12 % foam APPLY TO SCALP AS DIRECTED  100 g  11  . metoprolol succinate (TOPROL-XL) 100 MG 24 hr tablet Take one tablet by mouth one time daily  90 tablet  2  . Multiple Vitamin (MULTIVITAMIN) tablet Take 1 tablet by mouth daily.      . ondansetron (ZOFRAN) 4 MG tablet Take 1 tablet by mouth as needed.      Marland Kitchen oseltamivir (TAMIFLU) 30 MG capsule Take 1 capsule (30 mg total) by mouth 2 (two) times  daily.  10 capsule  0  . oxyCODONE-acetaminophen (PERCOCET/ROXICET) 5-325 MG per tablet Take 1 tablet by mouth every 6 (six) hours as needed for pain.      . ranitidine (ZANTAC) 300 MG tablet Take one tablet by mouth   nightly at bedtime  90 tablet  3  . spironolactone (ALDACTONE) 25 MG tablet Take 0.5 tablets (12.5 mg total) by mouth daily.  90 tablet  3  . tamsulosin (FLOMAX) 0.4 MG CAPS capsule Take 1 capsule by mouth daily. 1/2 hr prior to meal      . triamcinolone cream (KENALOG) 0.5 % Apply topically 3 (three) times daily.  30 g  3  . Vilazodone HCl (VIIBRYD) 40 MG TABS Take 1 tablet by mouth daily.  140 tablet  0  . [DISCONTINUED] Alum & Mag Hydroxide-Simeth (MAGIC MOUTHWASH) SOLN Take 5 mLs by mouth 3 (three) times daily. 14 days then stop        No current facility-administered medications for this visit.    Allergies  Sulfonamide derivatives; Tetanus toxoids; Other; and Nsaids  Electrocardiogram:  AV paced rate 74    Assessment and Plan

## 2013-08-31 NOTE — Assessment & Plan Note (Signed)
S/P tissue TVR SBE prophylaxis normal finction on echo with mild diastolic gradient

## 2013-08-31 NOTE — Progress Notes (Signed)
Echocardiogram performed.  

## 2013-08-31 NOTE — Assessment & Plan Note (Signed)
S/P epicardial pacer with revision( due to TVR)  ECG with limb lead reversal in office today but stable AV pacing.  Transtelephonic may be hard for patient as she has no land line  To be checked in March F/U Allred

## 2013-08-31 NOTE — Patient Instructions (Signed)
Your physician wants you to follow-up in:  6 MONTHS WITH DR NISHAN  You will receive a reminder letter in the mail two months in advance. If you don't receive a letter, please call our office to schedule the follow-up appointment. Your physician recommends that you continue on your current medications as directed. Please refer to the Current Medication list given to you today. 

## 2013-08-31 NOTE — Assessment & Plan Note (Signed)
Post aggressive myectomy now closed post infarct/repair.

## 2013-08-31 NOTE — Assessment & Plan Note (Signed)
Post surgical repair No LVOT gradient Continue meds for diastolic dysfunction Presyncope and dyspnea improved

## 2013-09-08 ENCOUNTER — Telehealth: Payer: Self-pay | Admitting: Gastroenterology

## 2013-09-08 NOTE — Telephone Encounter (Signed)
Pt had COLON 01/30/10 with 3 cecal polyps, sigmoid polyp that were tubular adenomas and hyperplastic; other colon polyps were benign. I cannot find a letter, but pt received a Recall stating she is past due for a COLON. Pt thought her COLON recall would be in 10 years. Please advise. Thanks

## 2013-09-09 NOTE — Telephone Encounter (Signed)
Needs colon June this year

## 2013-09-09 NOTE — Telephone Encounter (Signed)
Informed pt of Dr Buel Ream recommendation to repeat her COLON this year. We discussed the importance of the Recall, but pt is not sure she will ever do another one d/t the prep. She will think about and call back when she feels she is ready to schedule. She does not know of any docs here; advised her to talk to her friends and ask their opinion about a GI Doc; pt stated understanding.  Dr Sharlett Iles, pt said to tell you she wishes you happiness in your retirement.

## 2013-09-28 ENCOUNTER — Encounter: Payer: Self-pay | Admitting: Internal Medicine

## 2013-09-28 ENCOUNTER — Ambulatory Visit (INDEPENDENT_AMBULATORY_CARE_PROVIDER_SITE_OTHER): Payer: Medicare PPO | Admitting: Internal Medicine

## 2013-09-28 ENCOUNTER — Other Ambulatory Visit (INDEPENDENT_AMBULATORY_CARE_PROVIDER_SITE_OTHER): Payer: Medicare PPO

## 2013-09-28 VITALS — BP 120/68 | HR 90 | Temp 97.6°F | Resp 16 | Wt 182.0 lb

## 2013-09-28 DIAGNOSIS — IMO0001 Reserved for inherently not codable concepts without codable children: Secondary | ICD-10-CM

## 2013-09-28 DIAGNOSIS — I10 Essential (primary) hypertension: Secondary | ICD-10-CM

## 2013-09-28 DIAGNOSIS — J449 Chronic obstructive pulmonary disease, unspecified: Secondary | ICD-10-CM

## 2013-09-28 DIAGNOSIS — J4489 Other specified chronic obstructive pulmonary disease: Secondary | ICD-10-CM

## 2013-09-28 DIAGNOSIS — I422 Other hypertrophic cardiomyopathy: Secondary | ICD-10-CM

## 2013-09-28 DIAGNOSIS — L309 Dermatitis, unspecified: Secondary | ICD-10-CM

## 2013-09-28 DIAGNOSIS — E1165 Type 2 diabetes mellitus with hyperglycemia: Principal | ICD-10-CM

## 2013-09-28 DIAGNOSIS — I5032 Chronic diastolic (congestive) heart failure: Secondary | ICD-10-CM

## 2013-09-28 DIAGNOSIS — R0602 Shortness of breath: Secondary | ICD-10-CM

## 2013-09-28 DIAGNOSIS — L259 Unspecified contact dermatitis, unspecified cause: Secondary | ICD-10-CM

## 2013-09-28 DIAGNOSIS — N259 Disorder resulting from impaired renal tubular function, unspecified: Secondary | ICD-10-CM

## 2013-09-28 LAB — BASIC METABOLIC PANEL
BUN: 22 mg/dL (ref 6–23)
CO2: 29 mEq/L (ref 19–32)
Calcium: 10.2 mg/dL (ref 8.4–10.5)
Chloride: 101 mEq/L (ref 96–112)
Creatinine, Ser: 1.2 mg/dL (ref 0.4–1.2)
GFR: 46.29 mL/min — AB (ref >60.00)
Glucose, Bld: 108 mg/dL — ABNORMAL HIGH (ref 70–99)
POTASSIUM: 4.1 meq/L (ref 3.5–5.1)
Sodium: 138 mEq/L (ref 135–145)

## 2013-09-28 LAB — HEMOGLOBIN A1C: HEMOGLOBIN A1C: 6.7 % — AB (ref 4.6–6.5)

## 2013-09-28 MED ORDER — FUROSEMIDE 40 MG PO TABS: ORAL_TABLET | ORAL | Status: DC

## 2013-09-28 MED ORDER — ALLOPURINOL 100 MG PO TABS: 100.0000 mg | ORAL_TABLET | Freq: Every day | ORAL | Status: DC

## 2013-09-28 MED ORDER — LINAGLIPTIN 5 MG PO TABS: 5.0000 mg | ORAL_TABLET | Freq: Every day | ORAL | Status: DC

## 2013-09-28 MED ORDER — TRIAMCINOLONE ACETONIDE 0.5 % EX CREA: TOPICAL_CREAM | Freq: Three times a day (TID) | CUTANEOUS | Status: DC

## 2013-09-28 NOTE — Assessment & Plan Note (Signed)
Her renal function is stable 

## 2013-09-28 NOTE — Assessment & Plan Note (Signed)
Her BP is well controlled Her lytes and renal function are stable 

## 2013-09-28 NOTE — Progress Notes (Signed)
Pre visit review using our clinic review tool, if applicable. No additional management support is needed unless otherwise documented below in the visit note. 

## 2013-09-28 NOTE — Assessment & Plan Note (Signed)
The A1C shows that her blood sugars are well controlled 

## 2013-09-28 NOTE — Patient Instructions (Signed)
Type 2 Diabetes Mellitus, Adult Type 2 diabetes mellitus, often simply referred to as type 2 diabetes, is a long-lasting (chronic) disease. In type 2 diabetes, the pancreas does not make enough insulin (a hormone), the cells are less responsive to the insulin that is made (insulin resistance), or both. Normally, insulin moves sugars from food into the tissue cells. The tissue cells use the sugars for energy. The lack of insulin or the lack of normal response to insulin causes excess sugars to build up in the blood instead of going into the tissue cells. As a result, high blood sugar (hyperglycemia) develops. The effect of high sugar (glucose) levels can cause many complications. Type 2 diabetes was also previously called adult-onset diabetes but it can occur at any age.  RISK FACTORS  A person is predisposed to developing type 2 diabetes if someone in the family has the disease and also has one or more of the following primary risk factors:  Overweight.  An inactive lifestyle.  A history of consistently eating high-calorie foods. Maintaining a normal weight and regular physical activity can reduce the chance of developing type 2 diabetes. SYMPTOMS  A person with type 2 diabetes may not show symptoms initially. The symptoms of type 2 diabetes appear slowly. The symptoms include:  Increased thirst (polydipsia).  Increased urination (polyuria).  Increased urination during the night (nocturia).  Weight loss. This weight loss may be rapid.  Frequent, recurring infections.  Tiredness (fatigue).  Weakness.  Vision changes, such as blurred vision.  Fruity smell to your breath.  Abdominal pain.  Nausea or vomiting.  Cuts or bruises which are slow to heal.  Tingling or numbness in the hands or feet. DIAGNOSIS Type 2 diabetes is frequently not diagnosed until complications of diabetes are present. Type 2 diabetes is diagnosed when symptoms or complications are present and when blood  glucose levels are increased. Your blood glucose level may be checked by one or more of the following blood tests:  A fasting blood glucose test. You will not be allowed to eat for at least 8 hours before a blood sample is taken.  A random blood glucose test. Your blood glucose is checked at any time of the day regardless of when you ate.  A hemoglobin A1c blood glucose test. A hemoglobin A1c test provides information about blood glucose control over the previous 3 months.  An oral glucose tolerance test (OGTT). Your blood glucose is measured after you have not eaten (fasted) for 2 hours and then after you drink a glucose-containing beverage. TREATMENT   You may need to take insulin or diabetes medicine daily to keep blood glucose levels in the desired range.  You will need to match insulin dosing with exercise and healthy food choices. The treatment goal is to maintain the before meal blood sugar (preprandial glucose) level at 70 130 mg/dL. HOME CARE INSTRUCTIONS   Have your hemoglobin A1c level checked twice a year.  Perform daily blood glucose monitoring as directed by your caregiver.  Monitor urine ketones when you are ill and as directed by your caregiver.  Take your diabetes medicine or insulin as directed by your caregiver to maintain your blood glucose levels in the desired range.  Never run out of diabetes medicine or insulin. It is needed every day.  Adjust insulin based on your intake of carbohydrates. Carbohydrates can raise blood glucose levels but need to be included in your diet. Carbohydrates provide vitamins, minerals, and fiber which are an essential part of   a healthy diet. Carbohydrates are found in fruits, vegetables, whole grains, dairy products, legumes, and foods containing added sugars.    Eat healthy foods. Alternate 3 meals with 3 snacks.  Lose weight if overweight.  Carry a medical alert card or wear your medical alert jewelry.  Carry a 15 gram  carbohydrate snack with you at all times to treat low blood glucose (hypoglycemia). Some examples of 15 gram carbohydrate snacks include:  Glucose tablets, 3 or 4   Glucose gel, 15 gram tube  Raisins, 2 tablespoons (24 grams)  Jelly beans, 6  Animal crackers, 8  Regular pop, 4 ounces (120 mL)  Gummy treats, 9  Recognize hypoglycemia. Hypoglycemia occurs with blood glucose levels of 70 mg/dL and below. The risk for hypoglycemia increases when fasting or skipping meals, during or after intense exercise, and during sleep. Hypoglycemia symptoms can include:  Tremors or shakes.  Decreased ability to concentrate.  Sweating.  Increased heart rate.  Headache.  Dry mouth.  Hunger.  Irritability.  Anxiety.  Restless sleep.  Altered speech or coordination.  Confusion.  Treat hypoglycemia promptly. If you are alert and able to safely swallow, follow the 15:15 rule:  Take 15 20 grams of rapid-acting glucose or carbohydrate. Rapid-acting options include glucose gel, glucose tablets, or 4 ounces (120 mL) of fruit juice, regular soda, or low fat milk.  Check your blood glucose level 15 minutes after taking the glucose.  Take 15 20 grams more of glucose if the repeat blood glucose level is still 70 mg/dL or below.  Eat a meal or snack within 1 hour once blood glucose levels return to normal.    Be alert to polyuria and polydipsia which are early signs of hyperglycemia. An early awareness of hyperglycemia allows for prompt treatment. Treat hyperglycemia as directed by your caregiver.  Engage in at least 150 minutes of moderate-intensity physical activity a week, spread over at least 3 days of the week or as directed by your caregiver. In addition, you should engage in resistance exercise at least 2 times a week or as directed by your caregiver.  Adjust your medicine and food intake as needed if you start a new exercise or sport.  Follow your sick day plan at any time you  are unable to eat or drink as usual.  Avoid tobacco use.  Limit alcohol intake to no more than 1 drink per day for nonpregnant women and 2 drinks per day for men. You should drink alcohol only when you are also eating food. Talk with your caregiver whether alcohol is safe for you. Tell your caregiver if you drink alcohol several times a week.  Follow up with your caregiver regularly.  Schedule an eye exam soon after the diagnosis of type 2 diabetes and then annually.  Perform daily skin and foot care. Examine your skin and feet daily for cuts, bruises, redness, nail problems, bleeding, blisters, or sores. A foot exam by a caregiver should be done annually.  Brush your teeth and gums at least twice a day and floss at least once a day. Follow up with your dentist regularly.  Share your diabetes management plan with your workplace or school.  Stay up-to-date with immunizations.  Learn to manage stress.  Obtain ongoing diabetes education and support as needed.  Participate in, or seek rehabilitation as needed to maintain or improve independence and quality of life. Request a physical or occupational therapy referral if you are having foot or hand numbness or difficulties with grooming,   dressing, eating, or physical activity. SEEK MEDICAL CARE IF:   You are unable to eat food or drink fluids for more than 6 hours.  You have nausea and vomiting for more than 6 hours.  Your blood glucose level is over 240 mg/dL.  There is a change in mental status.  You develop an additional serious illness.  You have diarrhea for more than 6 hours.  You have been sick or have had a fever for a couple of days and are not getting better.  You have pain during any physical activity.  SEEK IMMEDIATE MEDICAL CARE IF:  You have difficulty breathing.  You have moderate to large ketone levels. MAKE SURE YOU:  Understand these instructions.  Will watch your condition.  Will get help right away if  you are not doing well or get worse. Document Released: 07/21/2005 Document Revised: 04/14/2012 Document Reviewed: 02/17/2012 ExitCare Patient Information 2014 [employer].  

## 2013-09-28 NOTE — Progress Notes (Signed)
Subjective:    Patient ID: Holly Simmons, female    DOB: 07-Nov-1940, 73 y.o.   MRN: [id number]  Diabetes She presents for her follow-up diabetic visit. She has type 2 diabetes mellitus. There are no hypoglycemic associated symptoms. Pertinent negatives for hypoglycemia include no dizziness, headaches or speech difficulty. There are no diabetic associated symptoms. Pertinent negatives for diabetes include no blurred vision, no chest pain, no fatigue, no foot paresthesias, no foot ulcerations, no polydipsia, no polyphagia, no polyuria, no visual change, no weakness and no weight loss. There are no hypoglycemic complications. Symptoms are stable. Diabetic complications include heart disease and nephropathy. Current diabetic treatment includes oral agent (monotherapy). She is compliant with treatment all of the time. She is following a generally healthy diet. Meal planning includes avoidance of concentrated sweets. She participates in exercise intermittently. There is no change in her home blood glucose trend. An ACE inhibitor/angiotensin II receptor blocker is contraindicated. She does not see a podiatrist.Eye exam is current.      Review of Systems  Constitutional: Negative.  Negative for fever, chills, weight loss, diaphoresis, appetite change and fatigue.  HENT: Negative.   Eyes: Negative.  Negative for blurred vision.  Respiratory: Negative.  Negative for cough, choking, chest tightness, shortness of breath, wheezing and stridor.   Cardiovascular: Negative.  Negative for chest pain, palpitations and leg swelling.  Gastrointestinal: Negative.  Negative for nausea, abdominal pain, diarrhea, constipation and blood in stool.  Endocrine: Negative.  Negative for polydipsia, polyphagia and polyuria.  Genitourinary: Negative.   Musculoskeletal: Negative.  Negative for arthralgias, back pain, myalgias, neck pain and neck stiffness.  Skin: Negative.   Allergic/Immunologic: Negative.     Neurological: Negative.  Negative for dizziness, speech difficulty, weakness, light-headedness and headaches.  Hematological: Negative.  Negative for adenopathy. Does not bruise/bleed easily.  Psychiatric/Behavioral: Negative.        Objective:   Physical Exam  Vitals reviewed. Constitutional: She is oriented to person, place, and time. She appears well-developed and well-nourished. No distress.  HENT:  Head: Normocephalic and atraumatic.  Mouth/Throat: Oropharynx is clear and moist. No oropharyngeal exudate.  Eyes: Conjunctivae are normal. Right eye exhibits no discharge. Left eye exhibits no discharge. No scleral icterus.  Neck: Normal range of motion. Neck supple. No JVD present. No tracheal deviation present. No thyromegaly present.  Cardiovascular: Normal rate, regular rhythm, normal heart sounds and intact distal pulses.  Exam reveals no gallop and no friction rub.   Pulmonary/Chest: Effort normal and breath sounds normal. No stridor. No respiratory distress. She has no wheezes. She has no rales. She exhibits no tenderness.  Abdominal: Soft. Bowel sounds are normal. She exhibits no distension and no mass. There is no tenderness. There is no rebound and no guarding.  Musculoskeletal: Normal range of motion. She exhibits no edema and no tenderness.  Lymphadenopathy:    She has no cervical adenopathy.  Neurological: She is oriented to person, place, and time.  Skin: Skin is warm and dry. No rash noted. She is not diaphoretic. No erythema. No pallor.  Psychiatric: She has a normal mood and affect. Her behavior is normal. Judgment and thought content normal.     Lab Results  Component Value Date   WBC 6.7 07/06/2012   HGB 13.2 07/06/2012   HCT 40.6 07/06/2012   PLT 163.0 07/06/2012   GLUCOSE 122* 05/26/2013   CHOL 142 05/26/2013   TRIG 79.0 05/26/2013   HDL 47.70 05/26/2013   LDLCALC 79 05/26/2013   ALT 14  01/06/2013   AST 18 01/06/2013   NA 139 05/26/2013   K 4.3 05/26/2013   CL  100 05/26/2013   CREATININE 1.3* 05/26/2013   BUN 20 05/26/2013   CO2 31 05/26/2013   TSH 1.30 05/26/2013   INR 1.9 09/15/2011   HGBA1C 7.4* 05/26/2013       Assessment & Plan:

## 2013-09-30 ENCOUNTER — Telehealth: Payer: Self-pay | Admitting: Internal Medicine

## 2013-09-30 NOTE — Telephone Encounter (Signed)
Relevant patient education assigned to patient using Emmi. ° °

## 2013-10-14 ENCOUNTER — Encounter: Payer: Self-pay | Admitting: Internal Medicine

## 2013-10-20 ENCOUNTER — Ambulatory Visit (INDEPENDENT_AMBULATORY_CARE_PROVIDER_SITE_OTHER): Payer: Medicare PPO | Admitting: *Deleted

## 2013-10-20 DIAGNOSIS — I442 Atrioventricular block, complete: Secondary | ICD-10-CM

## 2013-10-21 LAB — MDC_IDC_ENUM_SESS_TYPE_REMOTE
Battery Remaining Longevity: 126 mo
Brady Statistic AP VP Percent: 97.9 %
Lead Channel Impedance Value: 992 Ohm
Lead Channel Pacing Threshold Amplitude: 0.75 V
Lead Channel Pacing Threshold Pulse Width: 0.4 ms
Lead Channel Pacing Threshold Pulse Width: 0.4 ms
Lead Channel Setting Pacing Amplitude: 2 V
Lead Channel Setting Pacing Amplitude: 2.5 V
Lead Channel Setting Sensing Sensitivity: 8 mV
MDC IDC MSMT BATTERY VOLTAGE: 2.79 V
MDC IDC MSMT LEADCHNL RA IMPEDANCE VALUE: 517 Ohm
MDC IDC MSMT LEADCHNL RA PACING THRESHOLD AMPLITUDE: 1 V
MDC IDC MSMT LEADCHNL RA SENSING INTR AMPL: 0.5 mV
MDC IDC MSMT LEADCHNL RV SENSING INTR AMPL: 8 mV
MDC IDC SET LEADCHNL RV PACING PULSEWIDTH: 0.4 ms
MDC IDC STAT BRADY AP VS PERCENT: 0.1 %
MDC IDC STAT BRADY AS VP PERCENT: 2.1 %
MDC IDC STAT BRADY AS VS PERCENT: 0.1 %

## 2013-10-24 ENCOUNTER — Encounter: Payer: Self-pay | Admitting: Internal Medicine

## 2013-11-01 NOTE — Progress Notes (Signed)
PPM remote 

## 2013-11-02 ENCOUNTER — Encounter: Payer: Self-pay | Admitting: *Deleted

## 2013-11-11 ENCOUNTER — Encounter: Payer: Self-pay | Admitting: Internal Medicine

## 2014-01-18 ENCOUNTER — Other Ambulatory Visit (INDEPENDENT_AMBULATORY_CARE_PROVIDER_SITE_OTHER): Payer: Medicare PPO

## 2014-01-18 ENCOUNTER — Ambulatory Visit (INDEPENDENT_AMBULATORY_CARE_PROVIDER_SITE_OTHER): Payer: Medicare PPO | Admitting: Internal Medicine

## 2014-01-18 ENCOUNTER — Encounter: Payer: Self-pay | Admitting: Internal Medicine

## 2014-01-18 VITALS — BP 138/78 | HR 86 | Temp 98.7°F | Resp 16 | Ht 63.0 in | Wt 178.0 lb

## 2014-01-18 DIAGNOSIS — K921 Melena: Secondary | ICD-10-CM

## 2014-01-18 DIAGNOSIS — K635 Polyp of colon: Secondary | ICD-10-CM

## 2014-01-18 DIAGNOSIS — F3289 Other specified depressive episodes: Secondary | ICD-10-CM

## 2014-01-18 DIAGNOSIS — D126 Benign neoplasm of colon, unspecified: Secondary | ICD-10-CM

## 2014-01-18 DIAGNOSIS — IMO0001 Reserved for inherently not codable concepts without codable children: Secondary | ICD-10-CM

## 2014-01-18 DIAGNOSIS — L600 Ingrowing nail: Secondary | ICD-10-CM

## 2014-01-18 DIAGNOSIS — E1165 Type 2 diabetes mellitus with hyperglycemia: Secondary | ICD-10-CM

## 2014-01-18 DIAGNOSIS — I10 Essential (primary) hypertension: Secondary | ICD-10-CM

## 2014-01-18 DIAGNOSIS — N259 Disorder resulting from impaired renal tubular function, unspecified: Secondary | ICD-10-CM

## 2014-01-18 DIAGNOSIS — F329 Major depressive disorder, single episode, unspecified: Secondary | ICD-10-CM

## 2014-01-18 DIAGNOSIS — Z23 Encounter for immunization: Secondary | ICD-10-CM

## 2014-01-18 LAB — HEMOGLOBIN A1C: HEMOGLOBIN A1C: 6.4 % (ref 4.6–6.5)

## 2014-01-18 LAB — BASIC METABOLIC PANEL
BUN: 20 mg/dL (ref 6–23)
CO2: 31 mEq/L (ref 19–32)
Calcium: 10.3 mg/dL (ref 8.4–10.5)
Chloride: 101 mEq/L (ref 96–112)
Creatinine, Ser: 1.3 mg/dL — ABNORMAL HIGH (ref 0.4–1.2)
GFR: 44.56 mL/min — AB (ref >60.00)
Glucose, Bld: 104 mg/dL — ABNORMAL HIGH (ref 70–99)
Potassium: 4.1 mEq/L (ref 3.5–5.1)
SODIUM: 138 meq/L (ref 135–145)

## 2014-01-18 MED ORDER — VILAZODONE HCL 40 MG PO TABS: 40.0000 mg | ORAL_TABLET | Freq: Every day | ORAL | Status: DC

## 2014-01-18 NOTE — Patient Instructions (Signed)
Type 2 Diabetes Mellitus, Adult Type 2 diabetes mellitus, often simply referred to as type 2 diabetes, is a long-lasting (chronic) disease. In type 2 diabetes, the pancreas does not make enough insulin (a hormone), the cells are less responsive to the insulin that is made (insulin resistance), or both. Normally, insulin moves sugars from food into the tissue cells. The tissue cells use the sugars for energy. The lack of insulin or the lack of normal response to insulin causes excess sugars to build up in the blood instead of going into the tissue cells. As a result, high blood sugar (hyperglycemia) develops. The effect of high sugar (glucose) levels can cause many complications. Type 2 diabetes was also previously called adult-onset diabetes but it can occur at any age.  RISK FACTORS  A person is predisposed to developing type 2 diabetes if someone in the family has the disease and also has one or more of the following primary risk factors:  Overweight.  An inactive lifestyle.  A history of consistently eating high-calorie foods. Maintaining a normal weight and regular physical activity can reduce the chance of developing type 2 diabetes. SYMPTOMS  A person with type 2 diabetes may not show symptoms initially. The symptoms of type 2 diabetes appear slowly. The symptoms include:  Increased thirst (polydipsia).  Increased urination (polyuria).  Increased urination during the night (nocturia).  Weight loss. This weight loss may be rapid.  Frequent, recurring infections.  Tiredness (fatigue).  Weakness.  Vision changes, such as blurred vision.  Fruity smell to your breath.  Abdominal pain.  Nausea or vomiting.  Cuts or bruises which are slow to heal.  Tingling or numbness in the hands or feet. DIAGNOSIS Type 2 diabetes is frequently not diagnosed until complications of diabetes are present. Type 2 diabetes is diagnosed when symptoms or complications are present and when blood  glucose levels are increased. Your blood glucose level may be checked by one or more of the following blood tests:  A fasting blood glucose test. You will not be allowed to eat for at least 8 hours before a blood sample is taken.  A random blood glucose test. Your blood glucose is checked at any time of the day regardless of when you ate.  A hemoglobin A1c blood glucose test. A hemoglobin A1c test provides information about blood glucose control over the previous 3 months.  An oral glucose tolerance test (OGTT). Your blood glucose is measured after you have not eaten (fasted) for 2 hours and then after you drink a glucose-containing beverage. TREATMENT   You may need to take insulin or diabetes medicine daily to keep blood glucose levels in the desired range.  If you use insulin, you may need to adjust the dosage depending on the carbohydrates that you eat with each meal or snack. The treatment goal is to maintain the before meal blood sugar (preprandial glucose) level at 70-130 mg/dL. HOME CARE INSTRUCTIONS   Have your hemoglobin A1c level checked twice a year.  Perform daily blood glucose monitoring as directed by your health care provider.  Monitor urine ketones when you are ill and as directed by your health care provider.  Take your diabetes medicine or insulin as directed by your health care provider to maintain your blood glucose levels in the desired range.  Never run out of diabetes medicine or insulin. It is needed every day.  If you are using insulin, you may need to adjust the amount of insulin given based on your intake   of carbohydrates. Carbohydrates can raise blood glucose levels but need to be included in your diet. Carbohydrates provide vitamins, minerals, and fiber which are an essential part of a healthy diet. Carbohydrates are found in fruits, vegetables, whole grains, dairy products, legumes, and foods containing added sugars.  Eat healthy foods. You should make an  appointment to see a registered dietitian to help you create an eating plan that is right for you.  Lose weight if overweight.  Carry a medical alert card or wear your medical alert jewelry.  Carry a 15 gram carbohydrate snack with you at all times to treat low blood glucose (hypoglycemia). Some examples of 15 gram carbohydrate snacks include:  Glucose tablets, 3 or 4  Raisins, 2 tablespoons (24 grams)  Jelly beans, 6  Animal crackers, 8  Regular pop, 4 ounces (120 mL)  Gummy treats, 9  Recognize hypoglycemia. Hypoglycemia occurs with blood glucose levels of 70 mg/dL and below. The risk for hypoglycemia increases when fasting or skipping meals, during or after intense exercise, and during sleep. Hypoglycemia symptoms can include:  Tremors or shakes.  Decreased ability to concentrate.  Sweating.  Increased heart rate.  Headache.  Dry mouth.  Hunger.  Irritability.  Anxiety.  Restless sleep.  Altered speech or coordination.  Confusion.  Treat hypoglycemia promptly. If you are alert and able to safely swallow, follow the 15:15 rule:  Take 15-20 grams of rapid-acting glucose or carbohydrate. Rapid-acting options include glucose gel, glucose tablets, or 4 ounces (120 mL) of fruit juice, regular soda, or low fat milk.  Check your blood glucose level 15 minutes after taking the glucose.  Take 15-20 grams more of glucose if the repeat blood glucose level is still 70 mg/dL or below.  Eat a meal or snack within 1 hour once blood glucose levels return to normal.  Be alert to feeling very thirsty and urinating more frequently than usual, which are early signs of hyperglycemia. An early awareness of hyperglycemia allows for prompt treatment. Treat hyperglycemia as directed by your health care provider.  Engage in at least 150 minutes of moderate-intensity physical activity a week, spread over at least 3 days of the week or as directed by your health care provider. In  addition, you should engage in resistance exercise at least 2 times a week or as directed by your health care provider.  Adjust your medicine and food intake as needed if you start a new exercise or sport.  Follow your sick day plan at any time you are unable to eat or drink as usual.  Avoid tobacco use.  Limit alcohol intake to no more than 1 drink per day for nonpregnant women and 2 drinks per day for men. You should drink alcohol only when you are also eating food. Talk with your health care provider whether alcohol is safe for you. Tell your health care provider if you drink alcohol several times a week.  Follow up with your health care provider regularly.  Schedule an eye exam soon after the diagnosis of type 2 diabetes and then annually.  Perform daily skin and foot care. Examine your skin and feet daily for cuts, bruises, redness, nail problems, bleeding, blisters, or sores. A foot exam by a health care provider should be done annually.  Brush your teeth and gums at least twice a day and floss at least once a day. Follow up with your dentist regularly.  Share your diabetes management plan with your workplace or school.  Stay up-to-date with   immunizations.  Learn to manage stress.  Obtain ongoing diabetes education and support as needed.  Participate in, or seek rehabilitation as needed to maintain or improve independence and quality of life. Request a physical or occupational therapy referral if you are having foot or hand numbness or difficulties with grooming, dressing, eating, or physical activity. SEEK MEDICAL CARE IF:   You are unable to eat food or drink fluids for more than 6 hours.  You have nausea and vomiting for more than 6 hours.  Your blood glucose level is over 240 mg/dL.  There is a change in mental status.  You develop an additional serious illness.  You have diarrhea for more than 6 hours.  You have been sick or have had a fever for a couple of days  and are not getting better.  You have pain during any physical activity.  SEEK IMMEDIATE MEDICAL CARE IF:  You have difficulty breathing.  You have moderate to large ketone levels. MAKE SURE YOU:  Understand these instructions.  Will watch your condition.  Will get help right away if you are not doing well or get worse. Document Released: 07/21/2005 Document Revised: 07/26/2013 Document Reviewed: 02/17/2012 ExitCare Patient Information 2015 [employer]. This information is not intended to replace advice given to you by your health care provider. Make sure you discuss any questions you have with your health care provider.  

## 2014-01-18 NOTE — Progress Notes (Signed)
Pre visit review using our clinic review tool, if applicable. No additional management support is needed unless otherwise documented below in the visit note. 

## 2014-01-18 NOTE — Progress Notes (Signed)
Subjective:    Patient ID: Holly Simmons, female    DOB: May 24, 1941, 73 y.o.   MRN: [id number]  Diabetes She presents for her follow-up diabetic visit. She has type 2 diabetes mellitus. Her disease course has been stable. There are no hypoglycemic associated symptoms. Pertinent negatives for hypoglycemia include no dizziness, headaches or tremors. Pertinent negatives for diabetes include no blurred vision, no chest pain, no fatigue, no foot paresthesias, no foot ulcerations, no polydipsia, no polyphagia, no polyuria, no visual change, no weakness and no weight loss. Diabetic complications include heart disease and nephropathy. Current diabetic treatment includes oral agent (monotherapy). She is compliant with treatment all of the time. Her weight is stable. She is following a generally healthy diet. Meal planning includes avoidance of concentrated sweets. She has not had a previous visit with a dietician. She participates in exercise intermittently. There is no change in her home blood glucose trend. She does not see a podiatrist.Eye exam is current.      Review of Systems  Constitutional: Negative.  Negative for fever, chills, weight loss, diaphoresis, appetite change and fatigue.  HENT: Negative.   Eyes: Negative.  Negative for blurred vision.  Respiratory: Positive for apnea. Negative for cough, choking, chest tightness, shortness of breath and stridor.   Cardiovascular: Negative.  Negative for chest pain, palpitations and leg swelling.  Gastrointestinal: Positive for blood in stool and anal bleeding. Negative for nausea, vomiting, abdominal pain, diarrhea and constipation.  Endocrine: Negative.  Negative for polydipsia, polyphagia and polyuria.  Genitourinary: Negative.   Musculoskeletal: Negative.  Negative for arthralgias, back pain, myalgias and neck pain.  Skin: Negative.  Negative for rash.  Allergic/Immunologic: Negative.   Neurological: Negative.  Negative for dizziness,  tremors, weakness, light-headedness, numbness and headaches.  Hematological: Negative.  Negative for adenopathy. Does not bruise/bleed easily.  Psychiatric/Behavioral: Negative.        Objective:   Physical Exam  Vitals reviewed. Constitutional: She is oriented to person, place, and time. She appears well-developed and well-nourished. No distress.  HENT:  Head: Normocephalic and atraumatic.  Mouth/Throat: Oropharynx is clear and moist. No oropharyngeal exudate.  Eyes: Conjunctivae are normal. Right eye exhibits no discharge. Left eye exhibits no discharge. No scleral icterus.  Neck: Normal range of motion. Neck supple. No JVD present. No tracheal deviation present. No thyromegaly present.  Cardiovascular: Normal rate, regular rhythm and intact distal pulses.  Exam reveals no gallop and no friction rub.   Murmur heard.  Decrescendo systolic murmur is present with a grade of 1/6   No diastolic murmur is present  Pulses:      Carotid pulses are 1+ on the right side, and 1+ on the left side.      Radial pulses are 1+ on the right side, and 1+ on the left side.       Femoral pulses are 1+ on the right side, and 1+ on the left side.      Popliteal pulses are 1+ on the right side, and 1+ on the left side.       Dorsalis pedis pulses are 1+ on the right side, and 1+ on the left side.       Posterior tibial pulses are 1+ on the right side, and 1+ on the left side.  Pulmonary/Chest: Effort normal and breath sounds normal. No stridor. No respiratory distress. She has no wheezes. She has no rales. She exhibits no tenderness.  Abdominal: Soft. Bowel sounds are normal. She exhibits no distension and no  mass. There is no tenderness. There is no rebound and no guarding.  Musculoskeletal: Normal range of motion. She exhibits no edema and no tenderness.  Lymphadenopathy:    She has no cervical adenopathy.  Neurological: She is oriented to person, place, and time.  Skin: Skin is warm and dry. No rash  noted. She is not diaphoretic. No erythema. No pallor.  Psychiatric: She has a normal mood and affect. Her behavior is normal. Judgment and thought content normal.     Lab Results  Component Value Date   WBC 6.7 07/06/2012   HGB 13.2 07/06/2012   HCT 40.6 07/06/2012   PLT 163.0 07/06/2012   GLUCOSE 108* 09/28/2013   CHOL 142 05/26/2013   TRIG 79.0 05/26/2013   HDL 47.70 05/26/2013   LDLCALC 79 05/26/2013   ALT 14 01/06/2013   AST 18 01/06/2013   NA 138 09/28/2013   K 4.1 09/28/2013   CL 101 09/28/2013   CREATININE 1.2 09/28/2013   BUN 22 09/28/2013   CO2 29 09/28/2013   TSH 1.30 05/26/2013   INR 1.9 09/15/2011   HGBA1C 6.7* 09/28/2013       Assessment & Plan:

## 2014-01-19 ENCOUNTER — Telehealth: Payer: Self-pay | Admitting: Internal Medicine

## 2014-01-19 NOTE — Telephone Encounter (Signed)
Relevant patient education assigned to patient using Emmi. ° °

## 2014-01-19 NOTE — Assessment & Plan Note (Signed)
The A1C shows very good blood sugar control Her renal function is stable

## 2014-01-19 NOTE — Assessment & Plan Note (Signed)
Her BP is well controlled 

## 2014-01-19 NOTE — Assessment & Plan Note (Signed)
Podiatry referral

## 2014-01-19 NOTE — Assessment & Plan Note (Signed)
I have asked her to see GI to consider upper and lower endcscopy

## 2014-01-23 ENCOUNTER — Telehealth: Payer: Self-pay | Admitting: Cardiology

## 2014-01-23 ENCOUNTER — Ambulatory Visit (INDEPENDENT_AMBULATORY_CARE_PROVIDER_SITE_OTHER): Payer: Medicare PPO | Admitting: *Deleted

## 2014-01-23 DIAGNOSIS — I442 Atrioventricular block, complete: Secondary | ICD-10-CM

## 2014-01-23 LAB — MDC_IDC_ENUM_SESS_TYPE_REMOTE
Battery Impedance: 137 Ohm
Battery Remaining Longevity: 132 mo
Brady Statistic AP VP Percent: 99 %
Lead Channel Pacing Threshold Pulse Width: 0.4 ms
Lead Channel Setting Pacing Amplitude: 2 V
Lead Channel Setting Pacing Pulse Width: 0.4 ms
Lead Channel Setting Sensing Sensitivity: 8 mV
MDC IDC MSMT BATTERY VOLTAGE: 2.79 V
MDC IDC MSMT LEADCHNL RA IMPEDANCE VALUE: 488 Ohm
MDC IDC MSMT LEADCHNL RA PACING THRESHOLD AMPLITUDE: 0.875 V
MDC IDC MSMT LEADCHNL RA PACING THRESHOLD PULSEWIDTH: 0.4 ms
MDC IDC MSMT LEADCHNL RV IMPEDANCE VALUE: 962 Ohm
MDC IDC MSMT LEADCHNL RV PACING THRESHOLD AMPLITUDE: 0.75 V
MDC IDC SESS DTM: 20150622151219
MDC IDC SET LEADCHNL RA PACING AMPLITUDE: 2 V
MDC IDC STAT BRADY AP VS PERCENT: 0 %
MDC IDC STAT BRADY AS VP PERCENT: 1 %
MDC IDC STAT BRADY AS VS PERCENT: 0 %

## 2014-01-23 NOTE — Progress Notes (Signed)
Remote pacemaker transmission.   

## 2014-01-23 NOTE — Telephone Encounter (Signed)
Spoke with pt and reminded pt of remote transmission that is due today. Pt verbalized understanding.   

## 2014-01-24 ENCOUNTER — Encounter: Payer: Self-pay | Admitting: Internal Medicine

## 2014-02-01 ENCOUNTER — Encounter: Payer: Self-pay | Admitting: Cardiology

## 2014-02-08 ENCOUNTER — Other Ambulatory Visit: Payer: Self-pay

## 2014-02-08 DIAGNOSIS — Z1231 Encounter for screening mammogram for malignant neoplasm of breast: Secondary | ICD-10-CM

## 2014-02-09 ENCOUNTER — Encounter: Payer: Self-pay | Admitting: Internal Medicine

## 2014-03-08 ENCOUNTER — Ambulatory Visit (INDEPENDENT_AMBULATORY_CARE_PROVIDER_SITE_OTHER): Payer: Medicare PPO | Admitting: Cardiovascular Disease

## 2014-03-08 ENCOUNTER — Encounter: Payer: Self-pay | Admitting: Cardiovascular Disease

## 2014-03-08 VITALS — BP 114/70 | HR 66 | Ht 63.0 in | Wt 180.4 lb

## 2014-03-08 DIAGNOSIS — I421 Obstructive hypertrophic cardiomyopathy: Secondary | ICD-10-CM

## 2014-03-08 DIAGNOSIS — I480 Paroxysmal atrial fibrillation: Secondary | ICD-10-CM

## 2014-03-08 DIAGNOSIS — I442 Atrioventricular block, complete: Secondary | ICD-10-CM

## 2014-03-08 DIAGNOSIS — I10 Essential (primary) hypertension: Secondary | ICD-10-CM

## 2014-03-08 DIAGNOSIS — I4891 Unspecified atrial fibrillation: Secondary | ICD-10-CM

## 2014-03-08 NOTE — Assessment & Plan Note (Signed)
Epicardial pacer still in abdomen but turned off  CS pacer placed 1/15  Transtelephonic interrogation F/U Allred She is pacer dependant

## 2014-03-08 NOTE — Patient Instructions (Signed)
Your physician wants you to follow-up in:  6 MONTHS WITH DR NISHAN  You will receive a reminder letter in the mail two months in advance. If you don't receive a letter, please call our office to schedule the follow-up appointment. Your physician recommends that you continue on your current medications as directed. Please refer to the Current Medication list given to you today. 

## 2014-03-08 NOTE — Progress Notes (Signed)
Patient ID: Holly Simmons, female   DOB: 1941/06/23, 73 y.o.   MRN: [id number] She has a fairly complex history. She has a history of HOCM and underwent surgery at [facility] recently. She was seen in followup by Dr. Rayann Heman 11/15/11 . She underwent myomectomy at [facility] 06/23/11. 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 She underwent cardioversion for atrial flutter and was placed on Coumadin and amiodarone. Epicardial leads are known to have a high threshold.  Echocardiogram 08/27/11: Status post septal myomectomy complicated by VSD now with VSD patch repair-patch covers the basal septal wall and leads to akinesis of the wall within that area, small to moderate residual VSD at the apical end of the patch, EF 50%, moderate diastolic dysfunction, no LVOT or SAM, trivial MR, TR with mildly elevated mean gradient.  Maintaining sinus rhythm and her amiodarone and Coumadin were both discontinued.  Subsequently had new pacer placed 1/15 . Could not cross TVR for stable lead but got good threshholds in CS.   Reviewed echo from1/15  and VSD has closed with overall good EF and normal funcitoning TVR No SAM    ROS: Denies fever, malais, weight loss, blurry vision, decreased visual acuity, cough, sputum, SOB, hemoptysis, pleuritic pain, palpitaitons, heartburn, abdominal pain, melena, lower extremity edema, claudication, or rash.  All other systems reviewed and negative  General: Affect appropriate Healthy:  appears stated age 12: normal Neck supple with no adenopathy JVP normal no bruits no thyromegaly Lungs clear with no wheezing and good diaphragmatic motion Heart:  S1/S2 no murmur, no rub, gallop or click PMI normal Abdomen: benighn, BS positve, no tenderness, no AAA  Epicardial pacer LLQ no bruit.  No HSM or HJR Distal pulses  intact with no bruits Plus one  Edema  Lot of varicose veins bilaterally  Neuro non-focal Skin warm and dry No muscular weakness   Current Outpatient Prescriptions  Medication Sig Dispense Refill  . allopurinol (ZYLOPRIM) 100 MG tablet Take 1 tablet (100 mg total) by mouth daily.  90 tablet  3  . Armodafinil (NUVIGIL) 150 MG tablet Take 1 tablet (150 mg total) by mouth as needed.  30 tablet  5  . aspirin 81 MG tablet Take 81 mg by mouth daily.        . B Complex-C (B-COMPLEX WITH VITAMIN C) tablet Take 1 tablet by mouth daily.      . budesonide-formoterol (SYMBICORT) 160-4.5 MCG/ACT inhaler Inhale 2 puffs into the lungs 2 (two) times daily.        Marland Kitchen buPROPion (WELLBUTRIN XL) 150 MG 24 hr tablet Take one tablet by mouth one time daily  90 tablet  3  . fexofenadine (ALLEGRA) 180 MG tablet Take 180 mg by mouth daily. prn       . furosemide (LASIX) 40 MG tablet One by mouth every other day  90 tablet  3  . hydrocortisone (ANUSOL-HC) 25 MG suppository Place 1 suppository (25 mg total) rectally at bedtime.  12 suppository  1  . linagliptin (TRADJENTA) 5 MG TABS tablet Take 1 tablet (5 mg total) by mouth daily.  90 tablet  3  . LUXIQ 0.12 % foam APPLY TO SCALP AS DIRECTED  100 g  11  . metoprolol succinate (TOPROL-XL) 100 MG 24 hr tablet Take one tablet by mouth one time daily  90 tablet  2  . Multiple Vitamin (MULTIVITAMIN) tablet Take 1 tablet by mouth daily.      . ranitidine (ZANTAC) 300 MG tablet Take one tablet by mouth   nightly at bedtime  90 tablet  3  . spironolactone (ALDACTONE) 25 MG tablet Take 0.5 tablets (12.5 mg total) by mouth daily.  90 tablet  3  . triamcinolone cream (KENALOG) 0.5 % Apply topically 3 (three) times daily.  30 g  3  . Vilazodone HCl (VIIBRYD) 40 MG TABS Take 1 tablet (40 mg total) by mouth daily.  90 tablet  3  . [DISCONTINUED] Alum & Mag Hydroxide-Simeth (MAGIC MOUTHWASH) SOLN Take 5 mLs by mouth 3 (three) times daily. 14 days then stop        No current  facility-administered medications for this visit.    Allergies  Sulfonamide derivatives; Tetanus toxoids; Other; and Nsaids  Electrocardiogram:  AV paced rate 79    Assessment and Plan

## 2014-03-08 NOTE — Assessment & Plan Note (Signed)
Maint NSR  Continue anticoagulation

## 2014-03-08 NOTE — Assessment & Plan Note (Signed)
Post surgical correction with no gradient  VSD has closed  And no MR  Improved

## 2014-03-08 NOTE — Assessment & Plan Note (Signed)
Well controlled.  Continue current medications and low sodium Dash type diet.    

## 2014-03-09 ENCOUNTER — Ambulatory Visit
Admission: RE | Admit: 2014-03-09 | Discharge: 2014-03-09 | Disposition: A | Discharge status: Home / Self Care | Payer: Medicare PPO | Source: Ambulatory Visit

## 2014-03-09 DIAGNOSIS — Z1231 Encounter for screening mammogram for malignant neoplasm of breast: Secondary | ICD-10-CM

## 2014-03-13 LAB — HM MAMMOGRAPHY: HM MAMMO: ABNORMAL

## 2014-03-14 ENCOUNTER — Other Ambulatory Visit: Payer: Self-pay | Admitting: Internal Medicine

## 2014-03-14 DIAGNOSIS — R928 Other abnormal and inconclusive findings on diagnostic imaging of breast: Secondary | ICD-10-CM

## 2014-03-17 ENCOUNTER — Ambulatory Visit
Admission: RE | Admit: 2014-03-17 | Discharge: 2014-03-17 | Disposition: A | Discharge status: Home / Self Care | Payer: Medicare PPO | Source: Ambulatory Visit | Attending: Internal Medicine | Admitting: Internal Medicine

## 2014-03-17 DIAGNOSIS — R928 Other abnormal and inconclusive findings on diagnostic imaging of breast: Secondary | ICD-10-CM

## 2014-03-17 LAB — HM MAMMOGRAPHY

## 2014-03-21 ENCOUNTER — Encounter: Payer: Self-pay | Admitting: Internal Medicine

## 2014-03-21 ENCOUNTER — Ambulatory Visit (INDEPENDENT_AMBULATORY_CARE_PROVIDER_SITE_OTHER): Payer: Medicare PPO | Admitting: Internal Medicine

## 2014-03-21 VITALS — BP 110/60 | HR 64 | Ht 64.0 in | Wt 179.1 lb

## 2014-03-21 DIAGNOSIS — Z860100 Personal history of colon polyps, unspecified: Secondary | ICD-10-CM

## 2014-03-21 DIAGNOSIS — K625 Hemorrhage of anus and rectum: Secondary | ICD-10-CM

## 2014-03-21 DIAGNOSIS — Z1211 Encounter for screening for malignant neoplasm of colon: Secondary | ICD-10-CM

## 2014-03-21 NOTE — Progress Notes (Signed)
Holly Simmons [birth year] [id number]  Note: This dictation was prepared with Dragon digital system. Any transcriptional errors that result from this procedure are unintentional.   History of Present Illness:  This is a 73 year old white female former patient of Dr. Sharlett Iles who is due for a screening colonoscopy. She has a history of adenomatous polyps and hyperplastic polyps on her last exam in June 2011. In June of this year, patient had one episode of low-volume bright per rectum which she has attributed to hemorrhoids. She has not had any recurrence since. 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 She also has ventriculoseptal defect, severe tricuspid regurgitation requiring patch closure and  tricuspid valve replacement. She has a history of complete heart block requiring a permanent pacemaker. Currently, she is doing well with an ejection fraction of 50%. She is followed by Dr. Rayann Heman and Dr. Johnsie Cancel. She has been reluctant to undergo a recall colonoscopy.because of the prep.    Past Medical History  Diagnosis Date  . Hypertension   . Hyperpotassemia   . Asthma     NOS w/ acute exacerbation  . Hypersomnia   . Depressive disorder   . Diastolic dysfunction   . Pyuria   . Renal insufficiency   . GERD (gastroesophageal reflux disease)   . COPD (chronic obstructive pulmonary disease)   . Obstructive sleep apnea     persistent daytime sleepiness despite cpap  . Allergic rhinitis   . Psoriasis   . Hypertrophic cardiomyopathy     s/p surgical myomectomy at [facility] 0000000 complicated by septal VSD post procedure requiring reoperation with patch closure and tricuspid valve replacement  . 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 Complete heart block     requiring PPM (MDT) post surgical myomectomy at [facility],  leads are epicardial with abdominal implant, high ventricular threshold at implant  . Myocardial  infarction   . Heart murmur   . 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 Gout 08/20/2012  . Colon polyps     TUBULAR ADENOMAS AND HYPERPLASTIC  . DM (diabetes mellitus)   . Gallstones   . Kidney stones     Past Surgical History  Procedure Laterality Date  . Appendectomy    . Cholecystectomy    . Tonsillectomy    . Cesarean section    . Abdominal hysterectomy    . Septal myomectomy for hypertrophic cm  06/23/11    by Dr Evelina Dun at [facility], complicated by septal VSD requiring patch repair and tricuspid valve replacement  . 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 Vsd repair  06/2011  . Pacemaker insertion  07/02/11    epicardial wires with abdominal implant at [place] 12/12,  high ventricular lead threshold at implant per Dr Westley Gambles  . Insert / replace / remove pacemaker  07/13/2012    Allergies  Allergen Reactions  . Sulfonamide Derivatives Anaphylaxis and Swelling  . Tetanus Toxoids Swelling  . Other Rash    STERI - STRIPS   . Nsaids     REACTION: proteinuria    Family history and social history have been reviewed.  Review of Systems: Denies recurrent rectal bleeding. Positive for constipation. Denies abdominal pain heartburn weight loss  The remainder of the 10 point ROS is negative except as outlined in the H&P  Physical Exam: General Appearance Well developed, in no distress, mildly overweight Eyes  Non icteric  HEENT  Non traumatic, normocephalic  Mouth No lesion, tongue papillated, no cheilosis Neck Supple without adenopathy, thyroid not enlarged, no carotid bruits, no JVD Lungs Clear to auscultation bilaterally, post thoracotomy scar, pacemaker under left clavicle  COR Normal S1, normal S2, regular rhythm, prosthetic heart sound no murmur, quiet precordium Abdomen obese soft with normal active bowel sounds. Liver edge at costal margin, another pacemaker in LUQ Rectal soft Hemoccult negative stool Extremities  No pedal edema, multiple superficial  varicosities Skin No lesions Neurological Alert and oriented x 3 Psychological Normal mood and affect  Assessment and Plan:   Problem #36 73 year old white female with a history of adenomatous polyps who is due for a recall colonoscopy. Patient declines  the procedure. There is no family history of colon cancer. She had one episode of low-volume hematochezia 2 months ago. She does not want to go through colonoscopy prep. She does not qualify for Visicol tablets due to renal insufficiency. I have suggested Cologuard to check stool DNA. We will schedule her for a Cologuard and if it is positive, then she will consider colonoscopy.    Holly Simmons 03/21/2014

## 2014-03-21 NOTE — Patient Instructions (Signed)
We have planned on getting you set up for cologuard test. You should be getting a phone call or information in the mail about this. If not, please call our office at [phone].  CC:Dr Scarlette Calico

## 2014-03-22 ENCOUNTER — Telehealth: Payer: Self-pay | Admitting: Internal Medicine

## 2014-03-22 DIAGNOSIS — Z86010 Personal history of colonic polyps: Secondary | ICD-10-CM

## 2014-03-22 NOTE — Telephone Encounter (Signed)
She is pacer dependent but her  EF is 50%, She can be done in [place].

## 2014-03-22 NOTE — Telephone Encounter (Signed)
Spoke with patient and she has decided to have a colonoscopy instead of Cologuard.  Should she be scheduled at [facility] or hospital?

## 2014-03-23 NOTE — Telephone Encounter (Signed)
Spoke with patient and scheduled colonoscopy at [facility] on 04/26/14 at 2:00 PM and pre visit on 04/19/14 at 1:30 PM.

## 2014-03-28 ENCOUNTER — Other Ambulatory Visit (INDEPENDENT_AMBULATORY_CARE_PROVIDER_SITE_OTHER): Payer: Medicare PPO

## 2014-03-28 ENCOUNTER — Ambulatory Visit (INDEPENDENT_AMBULATORY_CARE_PROVIDER_SITE_OTHER): Payer: Medicare PPO | Admitting: Internal Medicine

## 2014-03-28 ENCOUNTER — Encounter: Payer: Self-pay | Admitting: Internal Medicine

## 2014-03-28 ENCOUNTER — Telehealth: Payer: Self-pay | Admitting: Internal Medicine

## 2014-03-28 VITALS — BP 120/62 | HR 74 | Temp 97.9°F | Wt 178.0 lb

## 2014-03-28 DIAGNOSIS — R310 Gross hematuria: Secondary | ICD-10-CM

## 2014-03-28 DIAGNOSIS — I10 Essential (primary) hypertension: Secondary | ICD-10-CM

## 2014-03-28 LAB — CBC WITH DIFFERENTIAL/PLATELET
Basophils Absolute: 0 10*3/uL (ref 0.0–0.1)
Basophils Relative: 0.5 % (ref 0.0–3.0)
EOS PCT: 2.9 % (ref 0.0–5.0)
Eosinophils Absolute: 0.3 10*3/uL (ref 0.0–0.7)
HCT: 40.3 % (ref 36.0–46.0)
Hemoglobin: 13.3 g/dL (ref 12.0–15.0)
LYMPHS PCT: 9.8 % — AB (ref 12.0–46.0)
Lymphs Abs: 0.9 10*3/uL (ref 0.7–4.0)
MCHC: 33.1 g/dL (ref 30.0–36.0)
MCV: 93.9 fl (ref 78.0–100.0)
MONOS PCT: 8.5 % (ref 3.0–12.0)
Monocytes Absolute: 0.7 10*3/uL (ref 0.1–1.0)
NEUTROS PCT: 78.3 % — AB (ref 43.0–77.0)
Neutro Abs: 6.8 10*3/uL (ref 1.4–7.7)
PLATELETS: 188 10*3/uL (ref 150.0–400.0)
RBC: 4.29 Mil/uL (ref 3.87–5.11)
RDW: 14.9 % (ref 11.5–15.5)
WBC: 8.7 10*3/uL (ref 4.0–10.5)

## 2014-03-28 LAB — URINALYSIS, ROUTINE W REFLEX MICROSCOPIC
Bilirubin Urine: NEGATIVE
Hgb urine dipstick: NEGATIVE
Leukocytes, UA: NEGATIVE
NITRITE: NEGATIVE
PH: 5.5 (ref 5.0–8.0)
TOTAL PROTEIN, URINE-UPE24: 30 — AB
Urine Glucose: NEGATIVE
Urobilinogen, UA: 0.2 (ref 0.0–1.0)

## 2014-03-28 LAB — BASIC METABOLIC PANEL
BUN: 20 mg/dL (ref 6–23)
CO2: 30 meq/L (ref 19–32)
Calcium: 10 mg/dL (ref 8.4–10.5)
Chloride: 99 mEq/L (ref 96–112)
Creatinine, Ser: 1.4 mg/dL — ABNORMAL HIGH (ref 0.4–1.2)
GFR: 39.11 mL/min — ABNORMAL LOW (ref >60.00)
Glucose, Bld: 172 mg/dL — ABNORMAL HIGH (ref 70–99)
Potassium: 4.3 mEq/L (ref 3.5–5.1)
Sodium: 137 mEq/L (ref 135–145)

## 2014-03-28 NOTE — Patient Instructions (Signed)
Please continue all other medications as before, and refills have been done if requested.  Please have the pharmacy call with any other refills you may need.  Please keep your appointments with your specialists as you may have planned  Please go to the LAB in the Basement (turn left off the elevator) for the tests to be done today  You will be contacted by phone if any changes need to be made immediately.  Otherwise, you will receive a letter about your results with an explanation, but please check with MyChart first.  You will be contacted regarding the referral for: CT abd/pelvis (see [facility] now), and Urology

## 2014-03-28 NOTE — Assessment & Plan Note (Signed)
One episode this am on asa 81 mg only, none since, asympt o/w, exam benign, cant r/o infection, stone, malignancy or other - for Urine studies, cbc, bmet, CT abd/pelvix with CM, refer urology - likely needs cysto for more complete eval

## 2014-03-28 NOTE — Progress Notes (Signed)
Pre visit review using our clinic review tool, if applicable. No additional management support is needed unless otherwise documented below in the visit note. 

## 2014-03-28 NOTE — Telephone Encounter (Signed)
Patient Information:  Caller Name: [name]  Phone: [phone]  Patient: Holly Simmons, Holly Simmons  Gender: Female  DOB: 1941/03/13  Age: 73 Years  PCP: Scarlette Calico (Adults only)  Office Follow Up:  Does the office need to follow up with this patient?: No  Instructions For The Office: N/A  RN Note:  Appointment scheduled for today, 03/28/14 @ 1:30pm with Dr. Cathlean Cower.  Symptoms  Reason For Call & Symptoms: Passed Blood while on toilet for urination. When asked about the amount of bleeding, spouse states, "a little looks like a lot but it was enough to fill the bowl."  Uncertain of source of blood. Denies having passed stool while on toilet. Denies dysuria. Denies bleeding since this episode which occurred "just a few minutes" prior to call. Denies any source of pain.  Reviewed Health History In EMR: Yes  Reviewed Medications In EMR: Yes  Reviewed Allergies In EMR: Yes  Reviewed Surgeries / Procedures: Yes  Date of Onset of Symptoms: 03/28/2014  Guideline(s) Used:  Vaginal Bleeding - Abnormal  Disposition Per Guideline:   See Within 2 Weeks in Office  Reason For Disposition Reached:   Age > 39 years with irregular or excessive bleeding  Advice Given:  N/A  Patient Will Follow Care Advice:  YES  Appointment Scheduled:  03/28/2014 13:30:00 Appointment Scheduled Provider:  Cathlean Cower (Adults only)

## 2014-03-28 NOTE — Telephone Encounter (Signed)
The patient has an appointment today 03/28/14 at 1:30 with Dr. Jenny Reichmann.

## 2014-03-28 NOTE — Progress Notes (Signed)
Subjective:    Patient ID: Holly Simmons, female    DOB: 01-Dec-1940, 73 y.o.   MRN: [id number]  HPI here to f/yu with acute complaint of one episode BRB with urination this am, Denies urinary symptoms such as dysuria, frequency, urgency, flank pain, hematuria or n/v, fever, chills. No further BRB with second urination today. + hx of renal stone.  Very worried father had blood in urine with hx of prostate cancer.  No hx of smoking, but father smoked as she grew up. Incidentally has ingrown nail right great toe tx today. Last UA here sept 2014.  Has seen nephrology Dr Clover Mealy for proteinuria, but has not seen urology. Pt denies chest pain, increased sob or doe, wheezing, orthopnea, PND, increased LE swelling, palpitations, dizziness or syncope.Denies worsening reflux, abd pain, dysphagia, n/v, bowel change Past Medical History  Diagnosis Date  . Hypertension   . Hyperpotassemia   . Asthma     NOS w/ acute exacerbation  . Hypersomnia   . Depressive disorder   . Diastolic dysfunction   . Pyuria   . Renal insufficiency   . GERD (gastroesophageal reflux disease)   . COPD (chronic obstructive pulmonary disease)   . Obstructive sleep apnea     persistent daytime sleepiness despite cpap  . Allergic rhinitis   . Psoriasis   . Hypertrophic cardiomyopathy     s/p surgical myomectomy at [facility] 0000000 complicated by septal VSD post procedure requiring reoperation with patch closure and tricuspid valve replacement  . 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 Complete heart block     requiring PPM (MDT) post surgical myomectomy at [facility],  leads are epicardial with abdominal implant, high ventricular threshold at implant  . Myocardial infarction   . Heart murmur   . 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 Gout 08/20/2012  . Colon polyps     TUBULAR ADENOMAS AND HYPERPLASTIC  . DM (diabetes mellitus)   . Gallstones   . Kidney stones    Past Surgical History  Procedure Laterality Date  .  Appendectomy    . Cholecystectomy    . Tonsillectomy    . Cesarean section    . Abdominal hysterectomy    . Septal myomectomy for hypertrophic cm  06/23/11    by Dr Evelina Dun at [facility], complicated by septal VSD requiring patch repair and tricuspid valve replacement  . Tricuspid valve replacement  11/12    Medtronic 65mm Mosaic tissue valve  . Vsd repair  06/2011  . Pacemaker insertion  07/02/11    epicardial wires with abdominal implant at [place] 12/12,  high ventricular lead threshold at implant per Dr Westley Gambles  . Insert / replace / remove pacemaker  07/13/2012    reports that she has never smoked. She has never used smokeless tobacco. She reports that she does not drink alcohol or use illicit drugs. family history includes Bladder Cancer in her father; Breast cancer in her mother and paternal aunt; Dementia in her maternal aunt, maternal uncle, and mother; Heart disease in her maternal grandfather and paternal grandfather; Hypertension in her mother; Ovarian cancer in her maternal aunt; Pancreatic cancer in her cousin; Prostate cancer in her father. Allergies  Allergen Reactions  . Sulfonamide Derivatives Anaphylaxis and Swelling  . Tetanus Toxoids Swelling  . Other Rash    STERI - STRIPS   . Nsaids     REACTION: proteinuria   Current Outpatient Prescriptions on File Prior to Visit  Medication Sig Dispense Refill  .  allopurinol (ZYLOPRIM) 100 MG tablet Take 1 tablet (100 mg total) by mouth daily.  90 tablet  3  . Armodafinil (NUVIGIL) 150 MG tablet Take 1 tablet (150 mg total) by mouth as needed.  30 tablet  5  . aspirin 81 MG tablet Take 81 mg by mouth daily.        . B Complex-C (B-COMPLEX WITH VITAMIN C) tablet Take 1 tablet by mouth daily.      . budesonide-formoterol (SYMBICORT) 160-4.5 MCG/ACT inhaler Inhale 2 puffs into the lungs 2 (two) times daily.        Marland Kitchen buPROPion (WELLBUTRIN XL) 150 MG 24 hr tablet Take one tablet by mouth one time daily  90 tablet  3  . fexofenadine  (ALLEGRA) 180 MG tablet Take 180 mg by mouth daily. prn       . furosemide (LASIX) 40 MG tablet One by mouth every other day  90 tablet  3  . hydrocortisone (ANUSOL-HC) 25 MG suppository Place 1 suppository (25 mg total) rectally at bedtime.  12 suppository  1  . linagliptin (TRADJENTA) 5 MG TABS tablet Take 1 tablet (5 mg total) by mouth daily.  90 tablet  3  . LUXIQ 0.12 % foam APPLY TO SCALP AS DIRECTED  100 g  11  . metoprolol succinate (TOPROL-XL) 100 MG 24 hr tablet Take one tablet by mouth one time daily  90 tablet  2  . Multiple Vitamin (MULTIVITAMIN) tablet Take 1 tablet by mouth daily.      . ranitidine (ZANTAC) 300 MG tablet Take one tablet by mouth   nightly at bedtime  90 tablet  3  . spironolactone (ALDACTONE) 25 MG tablet Take 0.5 tablets (12.5 mg total) by mouth daily.  90 tablet  3  . triamcinolone cream (KENALOG) 0.5 % Apply topically 3 (three) times daily.  30 g  3  . Vilazodone HCl (VIIBRYD) 40 MG TABS Take 1 tablet (40 mg total) by mouth daily.  90 tablet  3  . [DISCONTINUED] Alum & Mag Hydroxide-Simeth (MAGIC MOUTHWASH) SOLN Take 5 mLs by mouth 3 (three) times daily. 14 days then stop        No current facility-administered medications on file prior to visit.   Review of Systems  Constitutional: Negative for unusual diaphoresis or other sweats  HENT: Negative for ringing in ear Eyes: Negative for double vision or worsening visual disturbance.  Respiratory: Negative for choking and stridor.   Gastrointestinal: Negative for vomiting or other signifcant bowel change Genitourinary: Negative for hematuria or decreased urine volume.  Musculoskeletal: Negative for other MSK pain or swelling Skin: Negative for color change and worsening wound.  Neurological: Negative for tremors and numbness other than noted  Psychiatric/Behavioral: Negative for decreased concentration or agitation other than above       Objective:   Physical Exam BP 120/62  Pulse 74  Temp(Src) 97.9 F  (36.6 C) (Oral)  Wt 178 lb (80.74 kg)  SpO2 97% VS noted,  Constitutional: Pt appears well-developed, well-nourished.  HENT: Head: NCAT.  Right Ear: External ear normal.  Left Ear: External ear normal.  Eyes: . Pupils are equal, round, and reactive to light. Conjunctivae and EOM are normal Neck: Normal range of motion. Neck supple.  Cardiovascular: Normal rate and regular rhythm.   Pulmonary/Chest: Effort normal and breath sounds normal.  Abd:  Soft, NT, ND, + BS Neurological: Pt is alert. Not confused , motor grossly intact Skin: Skin is warm. No rash Psychiatric: Pt behavior is  normal. No agitation.     Assessment & Plan:

## 2014-03-28 NOTE — Assessment & Plan Note (Signed)
stable overall by history and exam, recent data reviewed with pt, and pt to continue medical treatment as before,  to f/u any worsening symptoms or concerns BP Readings from Last 3 Encounters:  03/28/14 120/62  03/21/14 110/60  03/08/14 114/70

## 2014-03-29 LAB — URINE CULTURE: Colony Count: 40000

## 2014-03-30 ENCOUNTER — Other Ambulatory Visit: Payer: Self-pay | Admitting: Internal Medicine

## 2014-03-30 ENCOUNTER — Ambulatory Visit (INDEPENDENT_AMBULATORY_CARE_PROVIDER_SITE_OTHER)
Admission: RE | Admit: 2014-03-30 | Discharge: 2014-03-30 | Disposition: A | Discharge status: Home / Self Care | Payer: Medicare PPO | Source: Ambulatory Visit | Attending: Internal Medicine | Admitting: Internal Medicine

## 2014-03-30 DIAGNOSIS — R310 Gross hematuria: Secondary | ICD-10-CM

## 2014-03-30 MED ORDER — IOHEXOL 300 MG/ML  SOLN
100.0000 mL | Freq: Once | INTRAMUSCULAR | Status: AC | PRN
  Administered 2014-03-30: 80 mL via INTRAVENOUS

## 2014-03-30 MED ORDER — LEVOFLOXACIN 250 MG PO TABS: 250.0000 mg | ORAL_TABLET | Freq: Every day | ORAL | Status: DC

## 2014-03-31 ENCOUNTER — Telehealth: Payer: Self-pay | Admitting: Internal Medicine

## 2014-03-31 NOTE — Telephone Encounter (Signed)
Message copied by Biagio Borg on Fri Mar 31, 2014 12:45 PM ------      Message from: Sharon Seller B      Created: Fri Mar 31, 2014  8:23 AM       Called the patient informed of results and antibiotic to start, as well as urology referral to keep.  The patient did verbalize understanding of results and instructions.  She did have one question she is currently on cephalexin for 7 days for an ingrown toe nail and gets the other nail done on 04/04/14.  Advise is ok to take both antibiotics? ------

## 2014-03-31 NOTE — Telephone Encounter (Signed)
Patient informed. 

## 2014-03-31 NOTE — Telephone Encounter (Signed)
Sorry for the confusion  OK to finish the levaquin she has  No need for the cephalexin at this time

## 2014-04-04 ENCOUNTER — Ambulatory Visit: Payer: Medicare PPO | Admitting: Internal Medicine

## 2014-04-04 DIAGNOSIS — I483 Typical atrial flutter: Secondary | ICD-10-CM

## 2014-04-04 HISTORY — DX: Typical atrial flutter: I48.3

## 2014-04-04 LAB — HEMOGLOBIN A1C: Hgb A1c MFr Bld: 6.3 % — AB (ref 4.0–6.0)

## 2014-04-18 ENCOUNTER — Ambulatory Visit: Payer: Medicare PPO | Admitting: Pulmonary Disease

## 2014-04-19 ENCOUNTER — Ambulatory Visit (AMBULATORY_SURGERY_CENTER): Payer: Self-pay | Admitting: *Deleted

## 2014-04-19 ENCOUNTER — Encounter: Payer: Self-pay | Admitting: Pulmonary Disease

## 2014-04-19 ENCOUNTER — Ambulatory Visit (INDEPENDENT_AMBULATORY_CARE_PROVIDER_SITE_OTHER): Payer: Medicare PPO | Admitting: Pulmonary Disease

## 2014-04-19 VITALS — BP 110/66 | HR 72 | Temp 98.2°F | Ht 63.0 in | Wt 179.0 lb

## 2014-04-19 VITALS — Ht 63.0 in | Wt 179.0 lb

## 2014-04-19 DIAGNOSIS — Z23 Encounter for immunization: Secondary | ICD-10-CM

## 2014-04-19 DIAGNOSIS — G4733 Obstructive sleep apnea (adult) (pediatric): Secondary | ICD-10-CM

## 2014-04-19 DIAGNOSIS — G4710 Hypersomnia, unspecified: Secondary | ICD-10-CM

## 2014-04-19 DIAGNOSIS — Z860100 Personal history of colon polyps, unspecified: Secondary | ICD-10-CM

## 2014-04-19 MED ORDER — MOVIPREP 100 G PO SOLR: 1.0000 | Freq: Once | ORAL | Status: DC

## 2014-04-19 MED ORDER — ARMODAFINIL 150 MG PO TABS: 150.0000 mg | ORAL_TABLET | ORAL | Status: DC | PRN

## 2014-04-19 NOTE — Assessment & Plan Note (Signed)
The patient has excellent compliance with good control of her AHI by her download. She feels that she is sleeping adequately, but still needs nuvigil at times for severe daytime sleepiness. She is having mask leaks intermittently, but overall it appears the mask is feeling fairly well. She has lost significant weight since her last visit, and I have commended her on this. If she continues on this path, she may be able to resolve her obstructive sleep apnea.

## 2014-04-19 NOTE — Progress Notes (Signed)
   Subjective:    Patient ID: Holly Simmons, female    DOB: Aug 13, 1940, 73 y.o.   MRN: [id number]  HPI Patient comes in today for followup of her known obstructive sleep apnea with persistent daytime hypersomnia. She's been wearing her CPAP compliantly by the download, and has excellent control of her AHI. She has a mild increased mask leak at times, but it is not that often by her download. She uses a digital as needed, especially when she has to go on long drives she feels that she sleeps fairly well with the device, and has maintained a stable daytime alertness. Of note, she has lost 23 pounds since last visit.    Review of Systems  Constitutional: Negative for fever and unexpected weight change.  HENT: Negative for congestion, dental problem, ear pain, nosebleeds, postnasal drip, rhinorrhea, sinus pressure, sneezing, sore throat and trouble swallowing.   Eyes: Negative for redness and itching.  Respiratory: Negative for cough, chest tightness, shortness of breath and wheezing.   Cardiovascular: Negative for palpitations and leg swelling.  Gastrointestinal: Negative for nausea and vomiting.  Genitourinary: Negative for dysuria.  Musculoskeletal: Negative for joint swelling.  Skin: Negative for rash.  Neurological: Negative for headaches.  Hematological: Does not bruise/bleed easily.  Psychiatric/Behavioral: Negative for dysphoric mood. The patient is not nervous/anxious.        Objective:   Physical Exam Well-developed female in no acute distress Nose without purulence or discharge noted No skin breakdown or pressure necrosis from the CPAP mask Neck without lymphadenopathy or thyromegaly Lower extremities with minimal edema, no cyanosis Alert and oriented, moves all 4 extremities.       Assessment & Plan:

## 2014-04-19 NOTE — Patient Instructions (Signed)
Stay on cpap, and keep up with mask changes and supplies. Will refill your nuvigil Keep working on weight loss.  You are doing great. followup with me again in one year.

## 2014-04-19 NOTE — Progress Notes (Signed)
NO EGG OR SOY ALLERGY. EWM NO HOME 02 USE. EWM PT USES CPAP. EWM PT DENIES ANY PROBLEMS WITH SEDATION. EWM SEE TE..per orders DR Olevia Perches, OK TO DO PROCEDURE IN THE [place] 03-22-14 TE. Marland Kitchen EWM 03-30-14 CT SCAN SHOWED FECAL IMPACTION, LAST COLON PATTERSON STATED POOR PREP WITH MOVI SO DOING 2 DAY PREP. PT CONCERNED ABOUT VOLUME SHE IS HAVING TO DRINK BUT STATES SHE WILL TRY AND CALL IF CANNOT HANDLE.  EMW

## 2014-04-26 ENCOUNTER — Encounter: Payer: Self-pay | Admitting: Internal Medicine

## 2014-04-26 ENCOUNTER — Ambulatory Visit (INDEPENDENT_AMBULATORY_CARE_PROVIDER_SITE_OTHER): Payer: Medicare PPO | Admitting: *Deleted

## 2014-04-26 ENCOUNTER — Ambulatory Visit (AMBULATORY_SURGERY_CENTER): Payer: Medicare PPO | Admitting: Internal Medicine

## 2014-04-26 VITALS — BP 125/71 | HR 62 | Temp 97.1°F | Resp 18 | Ht 63.0 in | Wt 179.0 lb

## 2014-04-26 DIAGNOSIS — D120 Benign neoplasm of cecum: Secondary | ICD-10-CM

## 2014-04-26 DIAGNOSIS — Z860100 Personal history of colon polyps, unspecified: Secondary | ICD-10-CM

## 2014-04-26 DIAGNOSIS — D126 Benign neoplasm of colon, unspecified: Secondary | ICD-10-CM

## 2014-04-26 DIAGNOSIS — D125 Benign neoplasm of sigmoid colon: Secondary | ICD-10-CM

## 2014-04-26 DIAGNOSIS — I442 Atrioventricular block, complete: Secondary | ICD-10-CM

## 2014-04-26 LAB — MDC_IDC_ENUM_SESS_TYPE_REMOTE
Battery Remaining Longevity: 134 mo
Battery Voltage: 2.79 V
Brady Statistic AP VP Percent: 91 %
Brady Statistic AS VP Percent: 9 %
Date Time Interrogation Session: 20150923145759
Lead Channel Impedance Value: 502 Ohm
Lead Channel Pacing Threshold Amplitude: 0.75 V
Lead Channel Pacing Threshold Pulse Width: 0.4 ms
Lead Channel Setting Pacing Amplitude: 2 V
Lead Channel Setting Pacing Amplitude: 2 V
Lead Channel Setting Sensing Sensitivity: 8 mV
MDC IDC MSMT BATTERY IMPEDANCE: 137 Ohm
MDC IDC MSMT LEADCHNL RA PACING THRESHOLD AMPLITUDE: 0.875 V
MDC IDC MSMT LEADCHNL RV IMPEDANCE VALUE: 962 Ohm
MDC IDC MSMT LEADCHNL RV PACING THRESHOLD PULSEWIDTH: 0.4 ms
MDC IDC SET LEADCHNL RV PACING PULSEWIDTH: 0.4 ms
MDC IDC STAT BRADY AP VS PERCENT: 0 %
MDC IDC STAT BRADY AS VS PERCENT: 0 %

## 2014-04-26 LAB — GLUCOSE, CAPILLARY
Glucose-Capillary: 93 mg/dL (ref 70–99)
Glucose-Capillary: 84 mg/dL (ref 70–99)

## 2014-04-26 MED ORDER — SODIUM CHLORIDE 0.9 % IV SOLN: 500.0000 mL | INTRAVENOUS | Status: DC

## 2014-04-26 NOTE — Patient Instructions (Signed)
Colon polyps x 2 removed today, diverticulosis and hemorrhoids seen today. Try to follow high fiber diet. Handouts given on polyps,diverticulosis and hemorrhoids. Resume current medications. Call us with any questions or concerns. Thank you!  YOU HAD AN ENDOSCOPIC PROCEDURE TODAY AT [facility]: Refer to the procedure report that was given to you for any specific questions about what was found during the examination.  If the procedure report does not answer your questions, please call your gastroenterologist to clarify.  If you requested that your care partner not be given the details of your procedure findings, then the procedure report has been included in a sealed envelope for you to review at your convenience later.  YOU SHOULD EXPECT: Some feelings of bloating in the abdomen. Passage of more gas than usual.  Walking can help get rid of the air that was put into your GI tract during the procedure and reduce the bloating. If you had a lower endoscopy (such as a colonoscopy or flexible sigmoidoscopy) you may notice spotting of blood in your stool or on the toilet paper. If you underwent a bowel prep for your procedure, then you may not have a normal bowel movement for a few days.  DIET: Your first meal following the procedure should be a light meal and then it is ok to progress to your normal diet.  A half-sandwich or bowl of soup is an example of a good first meal.  Heavy or fried foods are harder to digest and may make you feel nauseous or bloated.  Likewise meals heavy in dairy and vegetables can cause extra gas to form and this can also increase the bloating.  Drink plenty of fluids but you should avoid alcoholic beverages for 24 hours.  ACTIVITY: Your care partner should take you home directly after the procedure.  You should plan to take it easy, moving slowly for the rest of the day.  You can resume normal activity the day after the procedure however you should NOT DRIVE or use  heavy machinery for 24 hours (because of the sedation medicines used during the test).    SYMPTOMS TO REPORT IMMEDIATELY: A gastroenterologist can be reached at any hour.  During normal business hours, 8:30 AM to 5:00 PM Monday through Friday, call [phone].  After hours and on weekends, please call the GI answering service at [phone] who will take a message and have the physician on call contact you.   Following lower endoscopy (colonoscopy or flexible sigmoidoscopy):  Excessive amounts of blood in the stool  Significant tenderness or worsening of abdominal pains  Swelling of the abdomen that is new, acute  Fever of 100F or higher  Following upper endoscopy (EGD)  Vomiting of blood or coffee ground material  New chest pain or pain under the shoulder blades  Painful or persistently difficult swallowing  New shortness of breath  Fever of 100F or higher  Black, tarry-looking stools  FOLLOW UP: If any biopsies were taken you will be contacted by phone or by letter within the next 1-3 weeks.  Call your gastroenterologist if you have not heard about the biopsies in 3 weeks.  Our staff will call the home number listed on your records the next business day following your procedure to check on you and address any questions or concerns that you may have at that time regarding the information given to you following your procedure. This is a courtesy call and so if there is no answer at  the home number and we have not heard from you through the emergency physician on call, we will assume that you have returned to your regular daily activities without incident.  SIGNATURES/CONFIDENTIALITY: You and/or your care partner have signed paperwork which will be entered into your electronic medical record.  These signatures attest to the fact that that the information above on your After Visit Summary has been reviewed and is understood.  Full responsibility of the confidentiality of this  discharge information lies with you and/or your care-partner.

## 2014-04-26 NOTE — Progress Notes (Signed)
Remote pacemaker transmission.   

## 2014-04-26 NOTE — Op Note (Signed)
[place]  [address] [place], [id number]   COLONOSCOPY PROCEDURE REPORT  PATIENT: Holly Simmons, Holly Simmons  MR#: [id number] BIRTHDATE: 1941/07/21 , 37  yrs. old GENDER: female ENDOSCOPIST: Lafayette Dragon, MD REFERRED BY:Dr Scarlette Calico  PROCEDURE DATE:  04/26/2014 PROCEDURE:   Colonoscopy with snare polypectomy First Screening Colonoscopy - Avg.  risk and is 50 yrs.  old or older - No.  Prior Negative Screening - Now for repeat screening. N/A  History of Adenoma - Now for follow-up colonoscopy & has been > or = to 3 yrs.  Yes hx of adenoma.  Has been 3 or more years since last colonoscopy.  Polyps Removed Today? Yes. ASA CLASS:   Class III INDICATIONS:tubular adenoma and hyperplastic polyp removed on colonoscopy in June 2011. ,low-volume hematochezia MEDICATIONS: Monitored anesthesia care and Propofol 200 mg  DESCRIPTION OF PROCEDURE:   After the risks benefits and alternatives of the procedure were thoroughly explained, informed consent was obtained.  The digital rectal exam revealed no abnormalities of the rectum.   The LB [id number]  endoscope was introduced through the anus and advanced to the cecum, which was identified by both the appendix and ileocecal valve. No adverse events experienced.   The quality of the prep was good, using MoviPrep  The instrument was then slowly withdrawn as the colon was fully examined.      COLON FINDINGS: Two polypoid shaped sessile polyps measuring 7 mm in size were found at the cecum and in the sigmoid colon.  A polypectomy was performed with a cold snare.  The resection was complete, the polyp tissue was completely retrieved and sent to histology.   There was mild diverticulosis noted in the sigmoid colon.   Small Grade I hemorrhoids were found.  Retroflexed views revealed no abnormalities. The time to cecum=12 minutes 44 seconds. Withdrawal time=6 minutes 07 seconds.  The scope was withdrawn and the procedure  completed. COMPLICATIONS: There were no complications.  ENDOSCOPIC IMPRESSION: 1.   Two sessile polyps 7-9 mm  were found at the cecum and in the sigmoid colon; polypectomy was performed with a cold snare x2 2.   Mild diverticulosis was noted in the sigmoid colon 3.   Small Grade I hemorrhoids  RECOMMENDATIONS: 1.  Await pathology results 2.  High fiber diet Recall colonoscopy pending path report  eSigned:  Lafayette Dragon, MD 04/26/2014 2:46 PM   cc:   PATIENT NAME:  [name], [name] MR#: [id number]

## 2014-04-26 NOTE — Progress Notes (Signed)
Stable to RR 

## 2014-04-26 NOTE — Progress Notes (Signed)
Called to room to assist during endoscopic procedure.  Patient ID and intended procedure confirmed with present staff. Received instructions for my participation in the procedure from the performing physician.  

## 2014-04-27 ENCOUNTER — Telehealth: Payer: Self-pay | Admitting: *Deleted

## 2014-04-27 ENCOUNTER — Other Ambulatory Visit: Payer: Self-pay | Admitting: Internal Medicine

## 2014-04-27 NOTE — Telephone Encounter (Signed)
  Follow up Call-  Call back number 04/26/2014  Post procedure Call Back phone  # [phone]  Permission to leave phone message Yes     Patient questions:  Do you have a fever, pain , or abdominal swelling? No. Pain Score  0 *  Have you tolerated food without any problems? Yes.    Have you been able to return to your normal activities? Yes.    Do you have any questions about your discharge instructions: Diet   No. Medications  No. Follow up visit  No.  Do you have questions or concerns about your Care? No.  Actions: * If pain score is 4 or above: No action needed, pain <4.

## 2014-05-02 ENCOUNTER — Encounter: Payer: Self-pay | Admitting: Internal Medicine

## 2014-05-07 LAB — HM COLONOSCOPY

## 2014-05-08 ENCOUNTER — Ambulatory Visit (INDEPENDENT_AMBULATORY_CARE_PROVIDER_SITE_OTHER): Payer: Medicare PPO | Admitting: Internal Medicine

## 2014-05-08 ENCOUNTER — Encounter: Payer: Self-pay | Admitting: Internal Medicine

## 2014-05-08 VITALS — BP 126/64 | HR 61 | Ht 63.0 in | Wt 177.8 lb

## 2014-05-08 DIAGNOSIS — I480 Paroxysmal atrial fibrillation: Secondary | ICD-10-CM

## 2014-05-08 DIAGNOSIS — I483 Typical atrial flutter: Secondary | ICD-10-CM

## 2014-05-08 DIAGNOSIS — I4892 Unspecified atrial flutter: Secondary | ICD-10-CM

## 2014-05-08 DIAGNOSIS — I119 Hypertensive heart disease without heart failure: Secondary | ICD-10-CM

## 2014-05-08 DIAGNOSIS — I5032 Chronic diastolic (congestive) heart failure: Secondary | ICD-10-CM

## 2014-05-08 DIAGNOSIS — I442 Atrioventricular block, complete: Secondary | ICD-10-CM

## 2014-05-08 DIAGNOSIS — I422 Other hypertrophic cardiomyopathy: Secondary | ICD-10-CM

## 2014-05-08 MED ORDER — WARFARIN SODIUM 2.5 MG PO TABS: 2.5000 mg | ORAL_TABLET | Freq: Every day | ORAL | Status: DC

## 2014-05-08 NOTE — Progress Notes (Signed)
PCP:  Scarlette Calico, MD Primary Cardiologist:  Dr Johnsie Cancel  The patient presents today for electrophysiology followup.  Since last being seen in our clinic, the patient reports doing very well.  She is asymptomatic today.  Recent remote interrogation of her pacemaker revealed aflutter with controlled v rate.  I therefore had her come to the office today to evaluate this further.  She is completely asymptomatic and doing well.    Today, she denies symptoms of palpitations, chest pain, shortness of breath, orthopnea, PND, lower extremity edema, dizziness, presyncope, or syncope.  The patient feels that she is tolerating medications without difficulties and is otherwise without complaint today.   Past Medical History  Diagnosis Date  . Hypertension   . Hyperpotassemia   . Asthma     NOS w/ acute exacerbation  . Hypersomnia   . Depressive disorder   . Diastolic dysfunction   . Pyuria   . Renal insufficiency   . GERD (gastroesophageal reflux disease)   . COPD (chronic obstructive pulmonary disease)   . Obstructive sleep apnea     persistent daytime sleepiness despite cpap  . Allergic rhinitis   . Psoriasis   . Hypertrophic cardiomyopathy     s/p surgical myomectomy at [facility] 0000000 complicated by septal VSD post procedure requiring reoperation with patch closure and tricuspid valve replacement  . 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 Complete heart block     requiring PPM (MDT) post surgical myomectomy at [facility],  leads are epicardial with abdominal implant, high ventricular threshold at implant  . Myocardial infarction 2011  . Heart murmur   . 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 Gout 08/20/2012  . Colon polyps     TUBULAR ADENOMAS AND HYPERPLASTIC  . DM (diabetes mellitus)   . Gallstones   . Kidney stones   . Blood transfusion without reported diagnosis   . Atrial fibrillation    Past Surgical History  Procedure Laterality Date  . Appendectomy    . Cholecystectomy    .  Tonsillectomy    . Cesarean section    . Abdominal hysterectomy    . Septal myomectomy for hypertrophic cm  06/23/11    by Dr Evelina Dun at [facility], complicated by septal VSD requiring patch repair and tricuspid valve replacement  . Tricuspid valve replacement  11/12    Medtronic 78mm Mosaic tissue valve  . Vsd repair  06/2011  . Pacemaker insertion  07/02/11    epicardial wires with abdominal implant at [place] 12/12,  high ventricular lead threshold at implant per Dr Westley Gambles  . Insert / replace / remove pacemaker  07/13/2012  . Colonoscopy    . Polypectomy      Current Outpatient Prescriptions  Medication Sig Dispense Refill  . allopurinol (ZYLOPRIM) 100 MG tablet Take 1 tablet (100 mg total) by mouth daily.  90 tablet  3  . Armodafinil (NUVIGIL) 150 MG tablet Take 1 tablet (150 mg total) by mouth as needed.  30 tablet  5  . B Complex-C (B-COMPLEX WITH VITAMIN C) tablet Take 1 tablet by mouth daily.      . budesonide-formoterol (SYMBICORT) 160-4.5 MCG/ACT inhaler Inhale 2 puffs into the lungs 2 (two) times daily.        Marland Kitchen buPROPion (WELLBUTRIN XL) 150 MG 24 hr tablet TAKE ONE TABLET BY MOUTH ONE TIME DAILY   90 tablet  2  . fexofenadine (ALLEGRA) 180 MG tablet Take 180 mg by mouth as needed for allergies.       Marland Kitchen  furosemide (LASIX) 40 MG tablet One by mouth every other day  90 tablet  3  . hydrocortisone (ANUSOL-HC) 25 MG suppository Place 25 mg rectally daily as needed for hemorrhoids.      Marland Kitchen linagliptin (TRADJENTA) 5 MG TABS tablet Take 1 tablet (5 mg total) by mouth daily.  90 tablet  3  . LUXIQ 0.12 % foam APPLY TO SCALP AS DIRECTED  100 g  11  . metoprolol succinate (TOPROL-XL) 100 MG 24 hr tablet TAKE ONE TABLET BY MOUTH ONE TIME DAILY   90 tablet  1  . Multiple Vitamin (MULTIVITAMIN) tablet Take 1 tablet by mouth daily.      . ranitidine (ZANTAC) 300 MG tablet TAKE ONE TABLET BY MOUTH AT BEDTIME   90 tablet  2  . spironolactone (ALDACTONE) 25 MG tablet Take 25 mg by mouth every other  day.      . triamcinolone cream (KENALOG) 0.5 % Apply topically 3 (three) times daily as needed.      . Vilazodone HCl (VIIBRYD) 40 MG TABS Take 1 tablet (40 mg total) by mouth daily.  90 tablet  3  . warfarin (COUMADIN) 2.5 MG tablet Take 1 tablet (2.5 mg total) by mouth daily at 6 PM.  45 tablet  3  . [DISCONTINUED] Alum & Mag Hydroxide-Simeth (MAGIC MOUTHWASH) SOLN Take 5 mLs by mouth 3 (three) times daily. 14 days then stop        No current facility-administered medications for this visit.    Allergies  Allergen Reactions  . Sulfonamide Derivatives Anaphylaxis and Swelling  . Tetanus Toxoids Swelling  . Other Rash    STERI - STRIPS   . Nsaids Other (See Comments)    REACTION: proteinuria    History   Social History  . Marital Status: Married    Spouse Name: [name]    Number of Children: 3  . Years of Education: N/A   Occupational History  . retired    Social History Main Topics  . Smoking status: Never Smoker   . Smokeless tobacco: Never Used  . Alcohol Use: No  . Drug Use: No  . Sexual Activity: Not Currently   Other Topics Concern  . Not on file   Social History Narrative  . No narrative on file    Family History  Problem Relation Age of Onset  . Bladder Cancer Father   . Prostate cancer Father   . Ovarian cancer Maternal Aunt   . Heart disease Maternal Grandfather   . Heart disease Paternal Grandfather   . Breast cancer Mother   . Dementia Mother   . Hypertension Mother   . Pancreatic cancer Cousin   . Breast cancer Paternal Aunt   . Dementia Maternal Aunt     x 2  . Dementia Maternal Uncle     x 2  . Colon cancer Neg Hx    Physical Exam: Filed Vitals:   05/08/14 1627  BP: 126/64  Pulse: 61  Height: 5\' 3"  (1.6 m)  Weight: 177 lb 12.8 oz (80.65 kg)    GEN- The patient is overweight appearing, alert and oriented x 3 today.   Head- normocephalic, atraumatic Eyes-  Sclera clear, conjunctiva pink Ears- hearing intact Oropharynx-  clear Neck- supple, Lungs- Clear to ausculation bilaterally, normal work of breathing Abd- pacemaker pocket is well healed Heart- Regular rate and rhythm (paced) GI- soft, NT, ND, + BS Extremities- no clubbing, cyanosis, or edema  ekg today typical appearing atrial flutter 61  bpm with RVH.  Pacemaker interrogation- reviewed in detail today,  See PACEART report  Assessment and Plan:  1. New onset atrial flutter The patient presents with typical appearing atrial flutter recently discovered on PPM remote interrogation.  She appears to be asymptomatic and rate controlled. Therapeutic strategies for atrial flutter including medicine and ablation were discussed in detail with the patient today. At this time, she is clear that she would not want to proceed with ablation.  Her chads2vasc score is at least 3.  I will therefore initiate anticoagulation with coumadin.  She has valvular heart disease and therefore she is not an ideal candidate for NOAC therapy.  She will return to see me in 2 months.  Once her INRs have been therapeutic for 4 weeks we will plan to pursue sinus rhythm.  I could possibly overdrive pace her out of atrial flutter on follow-up visit.  If this is not successful then she would prefer cardioversion over ablation.  Given her h/o HCM and prior valvular surgery, I think that her longterm risks of atrial arrhythmias is high.  Long term anticoagulation would therefore not be unreasonable.  If she has decompensation in her clinical status over the next few weeks, she is instructed to call our office and we could proceed with TEE guided cardioversion if necessary.  She would like to avoid this if possible.  2. Complete heart block Normal pacemaker function with new onset aflutter, asypmtomatic. See Claudia Desanctis Art report.  3. Hypertensive cardiovascular disease Stable No change required today  Continue carelink remote transmissions Return to see me in 2 months Follow-up with Dr Johnsie Cancel as  scheduled.

## 2014-05-08 NOTE — Patient Instructions (Addendum)
You have been referred to Coumadin Clinic on Fri---started Warfarin 2.5mg  daily  Your physician has recommended you make the following change in your medication:  1) Start Warfarin 2.5mg  daily 2) Stop Aspirin

## 2014-05-12 ENCOUNTER — Encounter: Payer: Self-pay | Admitting: Internal Medicine

## 2014-05-12 ENCOUNTER — Ambulatory Visit (INDEPENDENT_AMBULATORY_CARE_PROVIDER_SITE_OTHER): Payer: Medicare PPO | Admitting: Pharmacist

## 2014-05-12 DIAGNOSIS — I483 Typical atrial flutter: Secondary | ICD-10-CM

## 2014-05-12 LAB — POCT INR: INR: 1.2

## 2014-05-17 ENCOUNTER — Ambulatory Visit (INDEPENDENT_AMBULATORY_CARE_PROVIDER_SITE_OTHER): Payer: Medicare PPO | Admitting: Pharmacist

## 2014-05-17 ENCOUNTER — Encounter: Payer: Self-pay | Admitting: Internal Medicine

## 2014-05-17 DIAGNOSIS — I483 Typical atrial flutter: Secondary | ICD-10-CM

## 2014-05-17 LAB — POCT INR: INR: 1.9

## 2014-05-24 ENCOUNTER — Encounter: Payer: Self-pay | Admitting: Internal Medicine

## 2014-05-24 ENCOUNTER — Ambulatory Visit (INDEPENDENT_AMBULATORY_CARE_PROVIDER_SITE_OTHER): Payer: Medicare PPO | Admitting: Internal Medicine

## 2014-05-24 ENCOUNTER — Other Ambulatory Visit (INDEPENDENT_AMBULATORY_CARE_PROVIDER_SITE_OTHER): Payer: Medicare PPO

## 2014-05-24 VITALS — BP 116/68 | HR 73 | Temp 97.8°F | Resp 16 | Ht 63.0 in | Wt 178.0 lb

## 2014-05-24 DIAGNOSIS — G4710 Hypersomnia, unspecified: Secondary | ICD-10-CM

## 2014-05-24 DIAGNOSIS — I110 Hypertensive heart disease with heart failure: Secondary | ICD-10-CM

## 2014-05-24 DIAGNOSIS — I5032 Chronic diastolic (congestive) heart failure: Secondary | ICD-10-CM

## 2014-05-24 DIAGNOSIS — I421 Obstructive hypertrophic cardiomyopathy: Secondary | ICD-10-CM

## 2014-05-24 DIAGNOSIS — E1121 Type 2 diabetes mellitus with diabetic nephropathy: Secondary | ICD-10-CM

## 2014-05-24 DIAGNOSIS — N259 Disorder resulting from impaired renal tubular function, unspecified: Secondary | ICD-10-CM

## 2014-05-24 DIAGNOSIS — K219 Gastro-esophageal reflux disease without esophagitis: Secondary | ICD-10-CM

## 2014-05-24 DIAGNOSIS — E785 Hyperlipidemia, unspecified: Secondary | ICD-10-CM

## 2014-05-24 DIAGNOSIS — IMO0001 Reserved for inherently not codable concepts without codable children: Secondary | ICD-10-CM

## 2014-05-24 DIAGNOSIS — I509 Heart failure, unspecified: Secondary | ICD-10-CM

## 2014-05-24 DIAGNOSIS — J301 Allergic rhinitis due to pollen: Secondary | ICD-10-CM

## 2014-05-24 LAB — BASIC METABOLIC PANEL
BUN: 17 mg/dL (ref 6–23)
CALCIUM: 10 mg/dL (ref 8.4–10.5)
CO2: 26 meq/L (ref 19–32)
Chloride: 102 mEq/L (ref 96–112)
Creatinine, Ser: 1.3 mg/dL — ABNORMAL HIGH (ref 0.4–1.2)
GFR: 44.92 mL/min — ABNORMAL LOW (ref >60.00)
Glucose, Bld: 159 mg/dL — ABNORMAL HIGH (ref 70–99)
Potassium: 3.9 mEq/L (ref 3.5–5.1)
SODIUM: 141 meq/L (ref 135–145)

## 2014-05-24 LAB — LIPID PANEL
Cholesterol: 135 mg/dL (ref 0–200)
HDL: 49.8 mg/dL (ref >39.00)
LDL Cholesterol: 69 mg/dL (ref 0–99)
NONHDL: 85.2
Total CHOL/HDL Ratio: 3
Triglycerides: 80 mg/dL (ref 0.0–149.0)
VLDL: 16 mg/dL (ref 0.0–40.0)

## 2014-05-24 LAB — MICROALBUMIN / CREATININE URINE RATIO
CREATININE, U: 407.2 mg/dL
Microalb Creat Ratio: 8.1 mg/g (ref 0.0–30.0)
Microalb, Ur: 32.8 mg/dL — ABNORMAL HIGH (ref 0.0–1.9)

## 2014-05-24 LAB — TSH: TSH: 1.54 u[IU]/mL (ref 0.35–4.50)

## 2014-05-24 MED ORDER — BUDESONIDE-FORMOTEROL FUMARATE 160-4.5 MCG/ACT IN AERO: 2.0000 | INHALATION_SPRAY | Freq: Two times a day (BID) | RESPIRATORY_TRACT | Status: DC

## 2014-05-24 NOTE — Progress Notes (Signed)
Subjective:    Patient ID: Holly Simmons, female    DOB: Jul 22, 1941, 73 y.o.   MRN: [id number]  Diabetes She presents for her follow-up diabetic visit. She has type 2 diabetes mellitus. Her disease course has been stable. There are no hypoglycemic associated symptoms. Pertinent negatives for hypoglycemia include no dizziness. Associated symptoms include polyuria. Pertinent negatives for diabetes include no blurred vision, no chest pain, no fatigue, no foot paresthesias, no foot ulcerations, no polydipsia, no polyphagia, no visual change, no weakness and no weight loss. There are no hypoglycemic complications. Symptoms are stable. Diabetic complications include heart disease and nephropathy. She is compliant with treatment all of the time. Her weight is stable. She is following a generally healthy diet. Meal planning includes avoidance of concentrated sweets. She participates in exercise intermittently. There is no change in her home blood glucose trend. An ACE inhibitor/angiotensin II receptor blocker is contraindicated. She does not see a podiatrist.Eye exam is current.      Review of Systems  Constitutional: Negative.  Negative for fever, chills, weight loss, diaphoresis, appetite change and fatigue.  HENT: Negative.   Eyes: Negative.  Negative for blurred vision.  Respiratory: Negative.  Negative for cough, choking, chest tightness, shortness of breath and stridor.   Cardiovascular: Negative.  Negative for chest pain, palpitations and leg swelling.  Gastrointestinal: Negative.  Negative for nausea, vomiting, abdominal pain, diarrhea, constipation and blood in stool.  Endocrine: Positive for polyuria. Negative for polydipsia and polyphagia.  Genitourinary: Negative.   Musculoskeletal: Negative.  Negative for back pain, joint swelling, myalgias and neck pain.  Skin: Negative.  Negative for rash.  Allergic/Immunologic: Negative.   Neurological: Negative.  Negative for dizziness and  weakness.  Hematological: Negative.  Negative for adenopathy. Does not bruise/bleed easily.  Psychiatric/Behavioral: Negative.        Objective:   Physical Exam  Vitals reviewed. Constitutional: She is oriented to person, place, and time. She appears well-developed and well-nourished. No distress.  HENT:  Head: Normocephalic and atraumatic.  Mouth/Throat: Oropharynx is clear and moist. No oropharyngeal exudate.  Eyes: Conjunctivae are normal. Right eye exhibits no discharge. Left eye exhibits no discharge. No scleral icterus.  Neck: Normal range of motion. Neck supple. No JVD present. No tracheal deviation present. No thyromegaly present.  Cardiovascular: Normal rate, regular rhythm, normal heart sounds and intact distal pulses.  Exam reveals no gallop and no friction rub.   No murmur heard. Pulmonary/Chest: Effort normal and breath sounds normal. No stridor. No respiratory distress. She has no wheezes. She has no rales. She exhibits no tenderness.  Abdominal: Soft. Bowel sounds are normal. She exhibits no distension and no mass. There is no tenderness. There is no rebound and no guarding.  Musculoskeletal: Normal range of motion. She exhibits no edema and no tenderness.  Lymphadenopathy:    She has no cervical adenopathy.  Neurological: She is oriented to person, place, and time.  Skin: Skin is warm and dry. No rash noted. She is not diaphoretic. No erythema. No pallor.  Psychiatric: She has a normal mood and affect. Her behavior is normal. Judgment and thought content normal.     Lab Results  Component Value Date   WBC 8.7 03/28/2014   HGB 13.3 03/28/2014   HCT 40.3 03/28/2014   PLT 188.0 03/28/2014   GLUCOSE 172* 03/28/2014   CHOL 142 05/26/2013   TRIG 79.0 05/26/2013   HDL 47.70 05/26/2013   LDLCALC 79 05/26/2013   ALT 14 01/06/2013   AST 18  01/06/2013   NA 137 03/28/2014   K 4.3 03/28/2014   CL 99 03/28/2014   CREATININE 1.4* 03/28/2014   BUN 20 03/28/2014   CO2 30 03/28/2014    TSH 1.30 05/26/2013   INR 1.9 05/17/2014   HGBA1C 6.4 01/18/2014       Assessment & Plan:

## 2014-05-24 NOTE — Patient Instructions (Signed)

## 2014-05-24 NOTE — Progress Notes (Signed)
Pre visit review using our clinic review tool, if applicable. No additional management support is needed unless otherwise documented below in the visit note. 

## 2014-05-25 ENCOUNTER — Ambulatory Visit (INDEPENDENT_AMBULATORY_CARE_PROVIDER_SITE_OTHER): Payer: Medicare PPO | Admitting: Pharmacist Clinician (PhC)/ Clinical Pharmacy Specialist

## 2014-05-25 ENCOUNTER — Encounter: Payer: Self-pay | Admitting: Internal Medicine

## 2014-05-25 DIAGNOSIS — I483 Typical atrial flutter: Secondary | ICD-10-CM

## 2014-05-25 LAB — URINALYSIS, ROUTINE W REFLEX MICROSCOPIC
Bilirubin Urine: NEGATIVE
HGB URINE DIPSTICK: NEGATIVE
KETONES UR: NEGATIVE
Nitrite: NEGATIVE
Specific Gravity, Urine: >=1.03 — AB (ref 1.000–1.030)
Total Protein, Urine: 30 — AB
URINE GLUCOSE: NEGATIVE
Urobilinogen, UA: 0.2 (ref 0.0–1.0)
pH: 5.5 (ref 5.0–8.0)

## 2014-05-25 LAB — POCT INR: INR: 2.9

## 2014-05-28 ENCOUNTER — Encounter: Payer: Self-pay | Admitting: Internal Medicine

## 2014-05-28 NOTE — Assessment & Plan Note (Signed)
Her BP is well controlled Lytes and renal function are stable 

## 2014-05-28 NOTE — Assessment & Plan Note (Signed)
Her blood sugars are well controlled Renal function is stable

## 2014-05-28 NOTE — Assessment & Plan Note (Signed)
She has achieved her LDL goal 

## 2014-06-06 ENCOUNTER — Ambulatory Visit (INDEPENDENT_AMBULATORY_CARE_PROVIDER_SITE_OTHER): Payer: Medicare PPO | Admitting: *Deleted

## 2014-06-06 DIAGNOSIS — I483 Typical atrial flutter: Secondary | ICD-10-CM

## 2014-06-06 LAB — POCT INR: INR: 2.6

## 2014-06-10 ENCOUNTER — Encounter: Payer: Self-pay | Admitting: Internal Medicine

## 2014-06-12 ENCOUNTER — Encounter: Payer: Self-pay | Admitting: Internal Medicine

## 2014-06-13 ENCOUNTER — Other Ambulatory Visit: Payer: Self-pay | Admitting: *Deleted

## 2014-06-13 ENCOUNTER — Ambulatory Visit (INDEPENDENT_AMBULATORY_CARE_PROVIDER_SITE_OTHER): Payer: Medicare PPO | Admitting: *Deleted

## 2014-06-13 DIAGNOSIS — I483 Typical atrial flutter: Secondary | ICD-10-CM

## 2014-06-13 DIAGNOSIS — I429 Cardiomyopathy, unspecified: Secondary | ICD-10-CM

## 2014-06-13 LAB — POCT INR: INR: 2.8

## 2014-06-13 MED ORDER — WARFARIN SODIUM 2.5 MG PO TABS: ORAL_TABLET | ORAL | Status: DC

## 2014-06-14 NOTE — Telephone Encounter (Signed)
She went to a cross stitch work shop over the weekend and sat for the weekend.  He weight went up 4 pounds and her ankles were swollen.  So she increased her fluid pill for a few days and now her weight is back down and ankles are back to normal.

## 2014-06-19 ENCOUNTER — Telehealth: Payer: Self-pay | Admitting: Internal Medicine

## 2014-06-19 NOTE — Telephone Encounter (Signed)
Called and left message with patient, so glad our plan seemed to have taken care of the problem

## 2014-06-19 NOTE — Telephone Encounter (Signed)
New message     Patient calling stating no problem with ankles swelling over this weekend.

## 2014-06-20 ENCOUNTER — Ambulatory Visit (INDEPENDENT_AMBULATORY_CARE_PROVIDER_SITE_OTHER): Payer: Medicare PPO | Admitting: Pharmacist Clinician (PhC)/ Clinical Pharmacy Specialist

## 2014-06-20 DIAGNOSIS — I483 Typical atrial flutter: Secondary | ICD-10-CM

## 2014-06-20 LAB — POCT INR: INR: 1.9

## 2014-06-27 ENCOUNTER — Ambulatory Visit (INDEPENDENT_AMBULATORY_CARE_PROVIDER_SITE_OTHER): Payer: Medicare PPO | Admitting: *Deleted

## 2014-06-27 DIAGNOSIS — I483 Typical atrial flutter: Secondary | ICD-10-CM

## 2014-06-27 LAB — POCT INR: INR: 2.5

## 2014-07-04 ENCOUNTER — Ambulatory Visit (INDEPENDENT_AMBULATORY_CARE_PROVIDER_SITE_OTHER): Payer: Medicare PPO | Admitting: Pharmacist

## 2014-07-04 DIAGNOSIS — I483 Typical atrial flutter: Secondary | ICD-10-CM

## 2014-07-04 LAB — POCT INR: INR: 3.1

## 2014-07-11 ENCOUNTER — Ambulatory Visit (INDEPENDENT_AMBULATORY_CARE_PROVIDER_SITE_OTHER): Payer: Medicare PPO | Admitting: *Deleted

## 2014-07-11 DIAGNOSIS — I483 Typical atrial flutter: Secondary | ICD-10-CM

## 2014-07-11 LAB — POCT INR: INR: 2.7

## 2014-07-13 ENCOUNTER — Encounter (HOSPITAL_COMMUNITY): Payer: Self-pay | Admitting: Internal Medicine

## 2014-07-18 ENCOUNTER — Ambulatory Visit (INDEPENDENT_AMBULATORY_CARE_PROVIDER_SITE_OTHER): Payer: Medicare PPO | Admitting: *Deleted

## 2014-07-18 DIAGNOSIS — I483 Typical atrial flutter: Secondary | ICD-10-CM

## 2014-07-18 LAB — POCT INR: INR: 2.5

## 2014-07-26 ENCOUNTER — Encounter: Payer: Self-pay | Admitting: Internal Medicine

## 2014-07-26 ENCOUNTER — Ambulatory Visit (INDEPENDENT_AMBULATORY_CARE_PROVIDER_SITE_OTHER): Payer: Medicare PPO | Admitting: *Deleted

## 2014-07-26 ENCOUNTER — Ambulatory Visit (INDEPENDENT_AMBULATORY_CARE_PROVIDER_SITE_OTHER): Payer: Medicare PPO | Admitting: Internal Medicine

## 2014-07-26 VITALS — BP 124/58 | HR 85 | Ht 63.0 in | Wt 181.2 lb

## 2014-07-26 DIAGNOSIS — I480 Paroxysmal atrial fibrillation: Secondary | ICD-10-CM

## 2014-07-26 DIAGNOSIS — I110 Hypertensive heart disease with heart failure: Secondary | ICD-10-CM

## 2014-07-26 DIAGNOSIS — I483 Typical atrial flutter: Secondary | ICD-10-CM

## 2014-07-26 DIAGNOSIS — I509 Heart failure, unspecified: Secondary | ICD-10-CM

## 2014-07-26 DIAGNOSIS — I442 Atrioventricular block, complete: Secondary | ICD-10-CM

## 2014-07-26 LAB — CBC WITH DIFFERENTIAL/PLATELET
Basophils Absolute: 0 10*3/uL (ref 0.0–0.1)
Basophils Relative: 0 % (ref 0.0–3.0)
EOS ABS: 0.2 10*3/uL (ref 0.0–0.7)
EOS PCT: 2.8 % (ref 0.0–5.0)
HEMATOCRIT: 38.4 % (ref 36.0–46.0)
Hemoglobin: 12.4 g/dL (ref 12.0–15.0)
LYMPHS ABS: 1 10*3/uL (ref 0.7–4.0)
Lymphocytes Relative: 12.7 % (ref 12.0–46.0)
MCHC: 32.3 g/dL (ref 30.0–36.0)
MCV: 92.3 fl (ref 78.0–100.0)
MONO ABS: 0.7 10*3/uL (ref 0.1–1.0)
Monocytes Relative: 9.1 % (ref 3.0–12.0)
Neutro Abs: 5.8 10*3/uL (ref 1.4–7.7)
Neutrophils Relative %: 75.4 % (ref 43.0–77.0)
PLATELETS: 167 10*3/uL (ref 150.0–400.0)
RBC: 4.16 Mil/uL (ref 3.87–5.11)
RDW: 15.6 % — ABNORMAL HIGH (ref 11.5–15.5)
WBC: 7.7 10*3/uL (ref 4.0–10.5)

## 2014-07-26 LAB — MDC_IDC_ENUM_SESS_TYPE_INCLINIC
Battery Impedance: 137 Ohm
Brady Statistic AP VS Percent: 0 %
Brady Statistic AS VP Percent: 100 %
Brady Statistic AS VS Percent: 0 %
Date Time Interrogation Session: 20151223145459
Lead Channel Impedance Value: 510 Ohm
Lead Channel Impedance Value: 959 Ohm
Lead Channel Pacing Threshold Amplitude: 0.75 V
Lead Channel Pacing Threshold Pulse Width: 0.4 ms
Lead Channel Sensing Intrinsic Amplitude: 2.8 mV
Lead Channel Setting Pacing Amplitude: 2.5 V
Lead Channel Setting Pacing Pulse Width: 0.4 ms
Lead Channel Setting Sensing Sensitivity: 8 mV
MDC IDC MSMT BATTERY REMAINING LONGEVITY: 143 mo
MDC IDC MSMT BATTERY VOLTAGE: 2.79 V
MDC IDC MSMT LEADCHNL RV SENSING INTR AMPL: 15.67 mV
MDC IDC SET LEADCHNL RA PACING AMPLITUDE: 2 V
MDC IDC STAT BRADY AP VP PERCENT: 0 %

## 2014-07-26 LAB — BASIC METABOLIC PANEL
BUN: 20 mg/dL (ref 6–23)
CHLORIDE: 102 meq/L (ref 96–112)
CO2: 30 meq/L (ref 19–32)
CREATININE: 1.2 mg/dL (ref 0.4–1.2)
Calcium: 10.1 mg/dL (ref 8.4–10.5)
GFR: 46.18 mL/min — ABNORMAL LOW (ref >60.00)
GLUCOSE: 155 mg/dL — AB (ref 70–99)
Potassium: 3.9 mEq/L (ref 3.5–5.1)
Sodium: 137 mEq/L (ref 135–145)

## 2014-07-26 LAB — POCT INR: INR: 2.8

## 2014-07-26 NOTE — Patient Instructions (Addendum)
Your physician recommends that you return for lab work TODAY (INR/CBC/BMET)   Your physician recommends that you schedule a follow-up appointment in: 5 Weeks with Dr. Rayann Heman   Your physician has recommended that you have a Cardioversion (DCCV). Electrical Cardioversion uses a jolt of electricity to your heart either through paddles or wired patches attached to your chest. This is a controlled, usually prescheduled, procedure. Defibrillation is done under light anesthesia in the hospital, and you usually go home the day of the procedure. This is done to get your heart back into a normal rhythm. You are not awake for the procedure. Please see the instruction sheet given to you today.

## 2014-07-27 NOTE — Progress Notes (Signed)
PCP:  Scarlette Calico, MD Primary Cardiologist:  Dr Johnsie Cancel  The patient presents today for electrophysiology followup.  Since last being seen in our clinic, the patient reports doing very well.  She is asymptomaticwith atrial flutter today.  Recent interrogation of her pacemaker revealed aflutter with controlled v rate.  Her INRs have now been therapeutic for 4 weeks.  Today, she denies symptoms of palpitations, chest pain, shortness of breath, orthopnea, PND, lower extremity edema, dizziness, presyncope, or syncope.  The patient feels that she is tolerating medications without difficulties and is otherwise without complaint today.   Past Medical History  Diagnosis Date  . Hypertension   . Hyperpotassemia   . Asthma     NOS w/ acute exacerbation  . Hypersomnia   . Depressive disorder   . Diastolic dysfunction   . Pyuria   . Renal insufficiency   . GERD (gastroesophageal reflux disease)   . COPD (chronic obstructive pulmonary disease)   . Obstructive sleep apnea     persistent daytime sleepiness despite cpap  . Allergic rhinitis   . Psoriasis   . Hypertrophic cardiomyopathy     s/p surgical myomectomy at [facility] 0000000 complicated by septal VSD post procedure requiring reoperation with patch closure and tricuspid valve replacement  . Tricuspid valve replaced     MDT 69mm Mosaic Valve  . Complete heart block     requiring PPM (MDT) post surgical myomectomy at [facility],  leads are epicardial with abdominal implant, high ventricular threshold at implant  . Myocardial infarction 2011  . Heart murmur   . 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 Gout 08/20/2012  . Colon polyps     TUBULAR ADENOMAS AND HYPERPLASTIC  . DM (diabetes mellitus)   . Gallstones   . Kidney stones   . Blood transfusion without reported diagnosis   . Typical atrial flutter 9/15   Past Surgical History  Procedure Laterality Date  . Appendectomy    . Cholecystectomy    . Tonsillectomy    . Cesarean section    . Abdominal  hysterectomy    . Septal myomectomy for hypertrophic cm  06/23/11    by Dr Evelina Dun at [facility], complicated by septal VSD requiring patch repair and tricuspid valve replacement  . 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 Vsd repair  06/2011  . Pacemaker insertion  07/02/11    epicardial wires with abdominal implant at [place] 12/12,  high ventricular lead threshold at implant per Dr Westley Gambles  . Insert / replace / remove pacemaker  07/13/2012  . Colonoscopy    . Polypectomy    . Permanent pacemaker insertion N/A 07/13/2012    Procedure: PERMANENT PACEMAKER INSERTION;  Surgeon: Thompson Grayer, MD;  Location: [facility] CATH LAB;  Service: Cardiovascular;  Laterality: N/A;    Current Outpatient Prescriptions  Medication Sig Dispense Refill  . allopurinol (ZYLOPRIM) 100 MG tablet Take 1 tablet (100 mg total) by mouth daily. 90 tablet 3  . Armodafinil (NUVIGIL) 150 MG tablet Take 1 tablet (150 mg total) by mouth as needed. 30 tablet 5  . B Complex-C (B-COMPLEX WITH VITAMIN C) tablet Take 1 tablet by mouth daily.    . budesonide-formoterol (SYMBICORT) 160-4.5 MCG/ACT inhaler Inhale 2 puffs into the lungs 2 (two) times daily. 1 Inhaler 11  . buPROPion (WELLBUTRIN XL) 150 MG 24 hr tablet TAKE ONE TABLET BY MOUTH ONE TIME DAILY  90 tablet 2  . fexofenadine (ALLEGRA) 180 MG tablet Take 180 mg by  mouth as needed for allergies.     . furosemide (LASIX) 40 MG tablet One by mouth every other day 90 tablet 3  . hydrocortisone (ANUSOL-HC) 25 MG suppository Place 25 mg rectally daily as needed for hemorrhoids.    Marland Kitchen linagliptin (TRADJENTA) 5 MG TABS tablet Take 1 tablet (5 mg total) by mouth daily. 90 tablet 3  . LUXIQ 0.12 % foam APPLY TO SCALP AS DIRECTED 100 g 11  . metoprolol succinate (TOPROL-XL) 100 MG 24 hr tablet TAKE ONE TABLET BY MOUTH ONE TIME DAILY  90 tablet 1  . Multiple Vitamin (MULTIVITAMIN) tablet Take 1 tablet by mouth daily.    . ranitidine (ZANTAC) 300 MG tablet TAKE  ONE TABLET BY MOUTH AT BEDTIME  90 tablet 2  . spironolactone (ALDACTONE) 25 MG tablet Take 25 mg by mouth every other day.    . triamcinolone cream (KENALOG) 0.5 % Apply topically 3 (three) times daily as needed.    . Vilazodone HCl (VIIBRYD) 40 MG TABS Take 1 tablet (40 mg total) by mouth daily. 90 tablet 3  . warfarin (COUMADIN) 2.5 MG tablet Take 1 tablet  to 1 and 1/2 tablets daily as directed by coumadin clinic 45 tablet 3  . [DISCONTINUED] Alum & Mag Hydroxide-Simeth (MAGIC MOUTHWASH) SOLN Take 5 mLs by mouth 3 (three) times daily. 14 days then stop      No current facility-administered medications for this visit.    Allergies  Allergen Reactions  . Sulfonamide Derivatives Anaphylaxis and Swelling  . Tetanus Toxoids Swelling  . Other Rash    STERI - STRIPS   . Nsaids Other (See Comments)    REACTION: proteinuria    History   Social History  . Marital Status: Married    Spouse Name: [name]    Number of Children: 3  . Years of Education: N/A   Occupational History  . retired    Social History Main Topics  . Smoking status: Never Smoker   . Smokeless tobacco: Never Used  . Alcohol Use: No  . Drug Use: No  . Sexual Activity: Not Currently   Other Topics Concern  . Not on file   Social History Narrative   ROS- all systems are reviewed and negative except as per HPI  Family History  Problem Relation Age of Onset  . Bladder Cancer Father   . Prostate cancer Father   . Ovarian cancer Maternal Aunt   . Heart disease Maternal Grandfather   . Heart disease Paternal Grandfather   . Breast cancer Mother   . Dementia Mother   . Hypertension Mother   . Pancreatic cancer Cousin   . Breast cancer Paternal Aunt   . Dementia Maternal Aunt     x 2  . Dementia Maternal Uncle     x 2  . Colon cancer Neg Hx    Physical Exam: Filed Vitals:   07/26/14 1402  BP: 124/58  Pulse: 85  Height: 5\' 3"  (1.6 m)  Weight: 181 lb 3.2 oz (82.192 kg)    GEN- The patient is  overweight appearing, alert and oriented x 3 today.   Head- normocephalic, atraumatic Eyes-  Sclera clear, conjunctiva pink Ears- hearing intact Oropharynx- clear Neck- supple, Lungs- Clear to ausculation bilaterally, normal work of breathing Abd- pacemaker pocket is well healed Heart- Regular rate and rhythm (paced) GI- soft, NT, ND, + BS Extremities- no clubbing, cyanosis, or edema  Pacemaker interrogation- reviewed in detail today,  See PACEART report  Assessment and  Plan:  1. New onset atrial flutter The patient presents with typical appearing atrial flutter recently discovered on PPM remote interrogation.  She appears to be asymptomatic and rate controlled.  Her INRs are now therapeutic for 4 weeks.  I tried extensively to pace terminate her atrial flutter today using her pacemaker.  Unfortunately this was unsuccessful. Therapeutic strategies for atrial flutter including cardioversion and ablation were discussed in detail with the patient today. At this time, she is clear that she would not want to proceed with ablation.  Her chads2vasc score is at least 3.  I will therefore continue anticoagulation with coumadin.  She has valvular heart disease and therefore she is not an ideal candidate for NOAC therapy.  Given her h/o HCM and prior valvular surgery, I think that her longterm risks of atrial arrhythmias is high.  Long term anticoagulation would therefore not be unreasonable.  We will schedule cardioversion at the next available time. She is at risk for stroke, hospitalization due to CHF, and clinical deterioration.  I high level of decision making was required for the visit today.  2. Complete heart block Normal pacemaker function with recent onset aflutter, asypmtomatic. See Claudia Desanctis Art report.  3. Hypertensive cardiovascular disease Stable No change required today  Continue carelink remote transmissions Return to see me in 5 weeks Follow-up with Dr Johnsie Cancel as scheduled.

## 2014-07-31 ENCOUNTER — Ambulatory Visit (INDEPENDENT_AMBULATORY_CARE_PROVIDER_SITE_OTHER): Payer: Medicare PPO | Admitting: *Deleted

## 2014-07-31 DIAGNOSIS — I483 Typical atrial flutter: Secondary | ICD-10-CM

## 2014-07-31 LAB — POCT INR: INR: 2.9

## 2014-08-01 ENCOUNTER — Ambulatory Visit (HOSPITAL_COMMUNITY)
Admission: RE | Admit: 2014-08-01 | Discharge: 2014-08-01 | Disposition: A | Discharge status: Home / Self Care | Payer: Medicare PPO | Source: Ambulatory Visit | Attending: Cardiovascular Disease | Admitting: Cardiovascular Disease

## 2014-08-01 ENCOUNTER — Ambulatory Visit (HOSPITAL_COMMUNITY): Payer: Medicare PPO | Admitting: Certified Registered Nurse Anesthetist

## 2014-08-01 ENCOUNTER — Encounter (HOSPITAL_COMMUNITY)
Admission: RE | Disposition: A | Discharge status: Home / Self Care | Payer: Self-pay | Source: Ambulatory Visit | Attending: Cardiovascular Disease

## 2014-08-01 ENCOUNTER — Encounter (HOSPITAL_COMMUNITY): Payer: Self-pay | Admitting: Certified Registered Nurse Anesthetist

## 2014-08-01 DIAGNOSIS — M109 Gout, unspecified: Secondary | ICD-10-CM | POA: Insufficient documentation

## 2014-08-01 DIAGNOSIS — I252 Old myocardial infarction: Secondary | ICD-10-CM | POA: Insufficient documentation

## 2014-08-01 DIAGNOSIS — J449 Chronic obstructive pulmonary disease, unspecified: Secondary | ICD-10-CM | POA: Insufficient documentation

## 2014-08-01 DIAGNOSIS — Z950 Presence of cardiac pacemaker: Secondary | ICD-10-CM | POA: Diagnosis not present

## 2014-08-01 DIAGNOSIS — K219 Gastro-esophageal reflux disease without esophagitis: Secondary | ICD-10-CM | POA: Insufficient documentation

## 2014-08-01 DIAGNOSIS — L409 Psoriasis, unspecified: Secondary | ICD-10-CM | POA: Diagnosis not present

## 2014-08-01 DIAGNOSIS — E119 Type 2 diabetes mellitus without complications: Secondary | ICD-10-CM | POA: Diagnosis not present

## 2014-08-01 DIAGNOSIS — Z882 Allergy status to sulfonamides status: Secondary | ICD-10-CM | POA: Diagnosis not present

## 2014-08-01 DIAGNOSIS — I4892 Unspecified atrial flutter: Secondary | ICD-10-CM | POA: Insufficient documentation

## 2014-08-01 DIAGNOSIS — I442 Atrioventricular block, complete: Secondary | ICD-10-CM | POA: Diagnosis not present

## 2014-08-01 DIAGNOSIS — F329 Major depressive disorder, single episode, unspecified: Secondary | ICD-10-CM | POA: Diagnosis not present

## 2014-08-01 DIAGNOSIS — G4733 Obstructive sleep apnea (adult) (pediatric): Secondary | ICD-10-CM | POA: Insufficient documentation

## 2014-08-01 DIAGNOSIS — Z886 Allergy status to analgesic agent status: Secondary | ICD-10-CM | POA: Diagnosis not present

## 2014-08-01 DIAGNOSIS — Z887 Allergy status to serum and vaccine status: Secondary | ICD-10-CM | POA: Diagnosis not present

## 2014-08-01 DIAGNOSIS — Z7901 Long term (current) use of anticoagulants: Secondary | ICD-10-CM | POA: Insufficient documentation

## 2014-08-01 DIAGNOSIS — J45909 Unspecified asthma, uncomplicated: Secondary | ICD-10-CM | POA: Insufficient documentation

## 2014-08-01 DIAGNOSIS — I422 Other hypertrophic cardiomyopathy: Secondary | ICD-10-CM | POA: Insufficient documentation

## 2014-08-01 DIAGNOSIS — I10 Essential (primary) hypertension: Secondary | ICD-10-CM | POA: Diagnosis not present

## 2014-08-01 DIAGNOSIS — Z79899 Other long term (current) drug therapy: Secondary | ICD-10-CM | POA: Diagnosis not present

## 2014-08-01 DIAGNOSIS — I4891 Unspecified atrial fibrillation: Secondary | ICD-10-CM

## 2014-08-01 HISTORY — PX: CARDIOVERSION: SHX1299

## 2014-08-01 LAB — GLUCOSE, CAPILLARY: Glucose-Capillary: 105 mg/dL — ABNORMAL HIGH (ref 70–99)

## 2014-08-01 SURGERY — CARDIOVERSION
Anesthesia: Monitor Anesthesia Care

## 2014-08-01 MED ORDER — SODIUM CHLORIDE 0.9 % IV SOLN
INTRAVENOUS | Status: DC | PRN
  Administered 2014-08-01: 12:00:00 via INTRAVENOUS

## 2014-08-01 MED ORDER — SODIUM CHLORIDE 0.9 % IV SOLN
INTRAVENOUS | Status: DC
Start: 2014-08-01 — End: 2014-08-01
  Administered 2014-08-01: 12:00:00 via INTRAVENOUS

## 2014-08-01 MED ORDER — PROPOFOL 10 MG/ML IV BOLUS
INTRAVENOUS | Status: DC | PRN
  Administered 2014-08-01: 70 mg via INTRAVENOUS

## 2014-08-01 MED ORDER — LIDOCAINE HCL (CARDIAC) 20 MG/ML IV SOLN
INTRAVENOUS | Status: DC | PRN
  Administered 2014-08-01: 40 mg via INTRAVENOUS

## 2014-08-01 NOTE — Transfer of Care (Signed)
Immediate Anesthesia Transfer of Care Note  Patient: Holly Simmons  Procedure(s) Performed: Procedure(s): CARDIOVERSION (N/A)  Patient Location: Endoscopy Unit  Anesthesia Type:MAC  Level of Consciousness: awake and patient cooperative  Airway & Oxygen Therapy: Patient Spontanous Breathing and Patient connected to nasal cannula oxygen  Post-op Assessment: Report given to PACU RN, Post -op Vital signs reviewed and stable and Patient moving all extremities  Post vital signs: Reviewed and stable  Complications: No apparent anesthesia complications

## 2014-08-01 NOTE — H&P (View-Only) (Signed)
PCP:  Scarlette Calico, MD Primary Cardiologist:  Dr Johnsie Cancel  The patient presents today for electrophysiology followup.  Since last being seen in our clinic, the patient reports doing very well.  She is asymptomaticwith atrial flutter today.  Recent interrogation of her pacemaker revealed aflutter with controlled v rate.  Her INRs have now been therapeutic for 4 weeks.  Today, she denies symptoms of palpitations, chest pain, shortness of breath, orthopnea, PND, lower extremity edema, dizziness, presyncope, or syncope.  The patient feels that she is tolerating medications without difficulties and is otherwise without complaint today.   Past Medical History  Diagnosis Date  . Hypertension   . Hyperpotassemia   . Asthma     NOS w/ acute exacerbation  . Hypersomnia   . Depressive disorder   . Diastolic dysfunction   . Pyuria   . Renal insufficiency   . GERD (gastroesophageal reflux disease)   . COPD (chronic obstructive pulmonary disease)   . Obstructive sleep apnea     persistent daytime sleepiness despite cpap  . Allergic rhinitis   . Psoriasis   . Hypertrophic cardiomyopathy     s/p surgical myomectomy at [facility] 0000000 complicated by septal VSD post procedure requiring reoperation with patch closure and tricuspid valve replacement  . 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 Complete heart block     requiring PPM (MDT) post surgical myomectomy at [facility],  leads are epicardial with abdominal implant, high ventricular threshold at implant  . Myocardial infarction 2011  . Heart murmur   . 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 Gout 08/20/2012  . Colon polyps     TUBULAR ADENOMAS AND HYPERPLASTIC  . DM (diabetes mellitus)   . Gallstones   . Kidney stones   . Blood transfusion without reported diagnosis   . Typical atrial flutter 9/15   Past Surgical History  Procedure Laterality Date  . Appendectomy    . Cholecystectomy    . Tonsillectomy    . Cesarean section    . Abdominal  hysterectomy    . Septal myomectomy for hypertrophic cm  06/23/11    by Dr Evelina Dun at [facility], complicated by septal VSD requiring patch repair and tricuspid valve replacement  . Tricuspid valve replacement  11/12    Medtronic 39mm Mosaic tissue valve  . Vsd repair  06/2011  . Pacemaker insertion  07/02/11    epicardial wires with abdominal implant at [place] 12/12,  high ventricular lead threshold at implant per Dr Westley Gambles  . Insert / replace / remove pacemaker  07/13/2012  . Colonoscopy    . Polypectomy    . Permanent pacemaker insertion N/A 07/13/2012    Procedure: PERMANENT PACEMAKER INSERTION;  Surgeon: Thompson Grayer, MD;  Location: [facility] CATH LAB;  Service: Cardiovascular;  Laterality: N/A;    Current Outpatient Prescriptions  Medication Sig Dispense Refill  . allopurinol (ZYLOPRIM) 100 MG tablet Take 1 tablet (100 mg total) by mouth daily. 90 tablet 3  . Armodafinil (NUVIGIL) 150 MG tablet Take 1 tablet (150 mg total) by mouth as needed. 30 tablet 5  . B Complex-C (B-COMPLEX WITH VITAMIN C) tablet Take 1 tablet by mouth daily.    . budesonide-formoterol (SYMBICORT) 160-4.5 MCG/ACT inhaler Inhale 2 puffs into the lungs 2 (two) times daily. 1 Inhaler 11  . buPROPion (WELLBUTRIN XL) 150 MG 24 hr tablet TAKE ONE TABLET BY MOUTH ONE TIME DAILY  90 tablet 2  . fexofenadine (ALLEGRA) 180 MG tablet Take 180 mg by  mouth as needed for allergies.     . furosemide (LASIX) 40 MG tablet One by mouth every other day 90 tablet 3  . hydrocortisone (ANUSOL-HC) 25 MG suppository Place 25 mg rectally daily as needed for hemorrhoids.    Marland Kitchen linagliptin (TRADJENTA) 5 MG TABS tablet Take 1 tablet (5 mg total) by mouth daily. 90 tablet 3  . LUXIQ 0.12 % foam APPLY TO SCALP AS DIRECTED 100 g 11  . metoprolol succinate (TOPROL-XL) 100 MG 24 hr tablet TAKE ONE TABLET BY MOUTH ONE TIME DAILY  90 tablet 1  . Multiple Vitamin (MULTIVITAMIN) tablet Take 1 tablet by mouth daily.    . ranitidine (ZANTAC) 300 MG tablet TAKE  ONE TABLET BY MOUTH AT BEDTIME  90 tablet 2  . spironolactone (ALDACTONE) 25 MG tablet Take 25 mg by mouth every other day.    . triamcinolone cream (KENALOG) 0.5 % Apply topically 3 (three) times daily as needed.    . Vilazodone HCl (VIIBRYD) 40 MG TABS Take 1 tablet (40 mg total) by mouth daily. 90 tablet 3  . warfarin (COUMADIN) 2.5 MG tablet Take 1 tablet  to 1 and 1/2 tablets daily as directed by coumadin clinic 45 tablet 3  . [DISCONTINUED] Alum & Mag Hydroxide-Simeth (MAGIC MOUTHWASH) SOLN Take 5 mLs by mouth 3 (three) times daily. 14 days then stop      No current facility-administered medications for this visit.    Allergies  Allergen Reactions  . Sulfonamide Derivatives Anaphylaxis and Swelling  . Tetanus Toxoids Swelling  . Other Rash    STERI - STRIPS   . Nsaids Other (See Comments)    REACTION: proteinuria    History   Social History  . Marital Status: Married    Spouse Name: [name]    Number of Children: 3  . Years of Education: N/A   Occupational History  . retired    Social History Main Topics  . Smoking status: Never Smoker   . Smokeless tobacco: Never Used  . Alcohol Use: No  . Drug Use: No  . Sexual Activity: Not Currently   Other Topics Concern  . Not on file   Social History Narrative   ROS- all systems are reviewed and negative except as per HPI  Family History  Problem Relation Age of Onset  . Bladder Cancer Father   . Prostate cancer Father   . Ovarian cancer Maternal Aunt   . Heart disease Maternal Grandfather   . Heart disease Paternal Grandfather   . Breast cancer Mother   . Dementia Mother   . Hypertension Mother   . Pancreatic cancer Cousin   . Breast cancer Paternal Aunt   . Dementia Maternal Aunt     x 2  . Dementia Maternal Uncle     x 2  . Colon cancer Neg Hx    Physical Exam: Filed Vitals:   07/26/14 1402  BP: 124/58  Pulse: 85  Height: 5\' 3"  (1.6 m)  Weight: 181 lb 3.2 oz (82.192 kg)    GEN- The patient is  overweight appearing, alert and oriented x 3 today.   Head- normocephalic, atraumatic Eyes-  Sclera clear, conjunctiva pink Ears- hearing intact Oropharynx- clear Neck- supple, Lungs- Clear to ausculation bilaterally, normal work of breathing Abd- pacemaker pocket is well healed Heart- Regular rate and rhythm (paced) GI- soft, NT, ND, + BS Extremities- no clubbing, cyanosis, or edema  Pacemaker interrogation- reviewed in detail today,  See PACEART report  Assessment and  Plan:  1. New onset atrial flutter The patient presents with typical appearing atrial flutter recently discovered on PPM remote interrogation.  She appears to be asymptomatic and rate controlled.  Her INRs are now therapeutic for 4 weeks.  I tried extensively to pace terminate her atrial flutter today using her pacemaker.  Unfortunately this was unsuccessful. Therapeutic strategies for atrial flutter including cardioversion and ablation were discussed in detail with the patient today. At this time, she is clear that she would not want to proceed with ablation.  Her chads2vasc score is at least 3.  I will therefore continue anticoagulation with coumadin.  She has valvular heart disease and therefore she is not an ideal candidate for NOAC therapy.  Given her h/o HCM and prior valvular surgery, I think that her longterm risks of atrial arrhythmias is high.  Long term anticoagulation would therefore not be unreasonable.  We will schedule cardioversion at the next available time. She is at risk for stroke, hospitalization due to CHF, and clinical deterioration.  I high level of decision making was required for the visit today.  2. Complete heart block Normal pacemaker function with recent onset aflutter, asypmtomatic. See Claudia Desanctis Art report.  3. Hypertensive cardiovascular disease Stable No change required today  Continue carelink remote transmissions Return to see me in 5 weeks Follow-up with Dr Johnsie Cancel as scheduled.

## 2014-08-01 NOTE — Anesthesia Preprocedure Evaluation (Signed)
Anesthesia Evaluation  Patient identified by MRN, date of birth, ID band  Airway        Dental   Pulmonary asthma , sleep apnea and Continuous Positive Airway Pressure Ventilation , COPD         Cardiovascular hypertension, Pt. on medications and Pt. on home beta blockers + Past MI and + Peripheral Vascular Disease + dysrhythmias Atrial Fibrillation + pacemaker + Valvular Problems/Murmurs     Neuro/Psych PSYCHIATRIC DISORDERS Depression    GI/Hepatic GERD-  ,  Endo/Other  diabetes  Renal/GU Renal InsufficiencyRenal disease     Musculoskeletal negative musculoskeletal ROS (+)   Abdominal   Peds  Hematology negative hematology ROS (+)   Anesthesia Other Findings   Reproductive/Obstetrics                             Anesthesia Physical Anesthesia Plan  ASA: III  Anesthesia Plan: MAC   Post-op Pain Management:    Induction: Intravenous  Airway Management Planned: Simple Face Mask  Additional Equipment:   Intra-op Plan:   Post-operative Plan:   Informed Consent: I have reviewed the patients History and Physical, chart, labs and discussed the procedure including the risks, benefits and alternatives for the proposed anesthesia with the patient or authorized representative who has indicated his/her understanding and acceptance.   Dental advisory given  Plan Discussed with:   Anesthesia Plan Comments:         Anesthesia Quick Evaluation

## 2014-08-01 NOTE — Discharge Instructions (Signed)
Electrical Cardioversion, Care After Refer to this sheet in the next few weeks. These instructions provide you with information on caring for yourself after your procedure. Your health care provider may also give you more specific instructions. Your treatment has been planned according to current medical practices, but problems sometimes occur. Call your health care provider if you have any problems or questions after your procedure. WHAT TO EXPECT AFTER THE PROCEDURE After your procedure, it is typical to have the following sensations:  Some redness on the skin where the shocks were delivered. If this is tender, a sunburn lotion or hydrocortisone cream may help.  Possible return of an abnormal heart rhythm within hours or days after the procedure. HOME CARE INSTRUCTIONS  Take medicines only as directed by your health care provider. Be sure you understand how and when to take your medicine.  Learn how to feel your pulse and check it often.  Limit your activity for 48 hours after the procedure or as directed by your health care provider.  Avoid or minimize caffeine and other stimulants as directed by your health care provider. SEEK MEDICAL CARE IF:  You feel like your heart is beating too fast or your pulse is not regular.  You have any questions about your medicines.  You have bleeding that will not stop. SEEK IMMEDIATE MEDICAL CARE IF:  You are dizzy or feel faint.  It is hard to breathe or you feel short of breath.  There is a change in discomfort in your chest.  Your speech is slurred or you have trouble moving an arm or leg on one side of your body.  You get a serious muscle cramp that does not go away.  Your fingers or toes turn cold or blue. Document Released: 05/11/2013 Document Revised: 12/05/2013 Document Reviewed: 05/11/2013 [facility] Patient Information 2015 [place], [place]. This information is not intended to replace advice given to you by your health care provider.  Make sure you discuss any questions you have with your health care provider. Conscious Sedation, Adult, Care After Refer to this sheet in the next few weeks. These instructions provide you with information on caring for yourself after your procedure. Your health care provider may also give you more specific instructions. Your treatment has been planned according to current medical practices, but problems sometimes occur. Call your health care provider if you have any problems or questions after your procedure. WHAT TO EXPECT AFTER THE PROCEDURE  After your procedure:  You may feel sleepy, clumsy, and have poor balance for several hours.  Vomiting may occur if you eat too soon after the procedure. HOME CARE INSTRUCTIONS  Do not participate in any activities where you could become injured for at least 24 hours. Do not:  Drive.  Swim.  Ride a bicycle.  Operate heavy machinery.  Cook.  Use power tools.  Climb ladders.  Work from a high place.  Do not make important decisions or sign legal documents until you are improved.  If you vomit, drink water, juice, or soup when you can drink without vomiting. Make sure you have little or no nausea before eating solid foods.  Only take over-the-counter or prescription medicines for pain, discomfort, or fever as directed by your health care provider.  Make sure you and your family fully understand everything about the medicines given to you, including what side effects may occur.  You should not drink alcohol, take sleeping pills, or take medicines that cause drowsiness for at least 24 hours.  If you smoke, do not smoke without supervision.  If you are feeling better, you may resume normal activities 24 hours after you were sedated.  Keep all appointments with your health care provider. SEEK MEDICAL CARE IF:  Your skin is pale or bluish in color.  You continue to feel nauseous or vomit.  Your pain is getting worse and is not helped  by medicine.  You have bleeding or swelling.  You are still sleepy or feeling clumsy after 24 hours. SEEK IMMEDIATE MEDICAL CARE IF:  You develop a rash.  You have difficulty breathing.  You develop any type of allergic problem.  You have a fever. MAKE SURE YOU:  Understand these instructions.  Will watch your condition.  Will get help right away if you are not doing well or get worse. Document Released: 05/11/2013 Document Reviewed: 05/11/2013 [facility] Patient Information 2015 [place], [place]. This information is not intended to replace advice given to you by your health care provider. Make sure you discuss any questions you have with your health care provider.

## 2014-08-01 NOTE — Anesthesia Postprocedure Evaluation (Signed)
  Anesthesia Post-op Note  Patient: Holly Simmons  Procedure(s) Performed: Procedure(s): CARDIOVERSION (N/A)  Patient Location: Endoscopy Unit  Anesthesia Type:MAC  Level of Consciousness: awake and patient cooperative  Airway and Oxygen Therapy: Patient Spontanous Breathing and Patient connected to nasal cannula oxygen  Post-op Pain: none  Post-op Assessment: Post-op Vital signs reviewed, Patient's Cardiovascular Status Stable, Respiratory Function Stable, Patent Airway and No signs of Nausea or vomiting  Post-op Vital Signs: Reviewed and stable  Last Vitals:  Filed Vitals:   08/01/14 1146  BP: 129/59  Pulse: 88  Resp: 11    Complications: No apparent anesthesia complications

## 2014-08-01 NOTE — Interval H&P Note (Signed)
History and Physical Interval Note:  08/01/2014 12:26 PM  Holly Simmons  has presented today for surgery, with the diagnosis of AFIB  The various methods of treatment have been discussed with the patient and family. After consideration of risks, benefits and other options for treatment, the patient has consented to  Procedure(s): CARDIOVERSION (N/A) as a surgical intervention .  The patient's history has been reviewed, patient examined, no change in status, stable for surgery.  I have reviewed the patient's chart and labs.  Questions were answered to the patient's satisfaction.     Caralina Nop, Wonda Cheng

## 2014-08-02 ENCOUNTER — Encounter (HOSPITAL_COMMUNITY): Payer: Self-pay | Admitting: Cardiovascular Disease

## 2014-08-03 NOTE — CV Procedure (Signed)
    Cardioversion Note  Holly Simmons [id number] 1940/11/26  Procedure: DC Cardioversion Indications: atrial fib  Procedure Details Consent: Obtained Time Out: Verified patient identification, verified procedure, site/side was marked, verified correct patient position, special equipment/implants available, Radiology Safety Procedures followed,  medications/allergies/relevent history reviewed, required imaging and test results available.  Performed  The patient has been on adequate anticoagulation.  The patient received IV Propofol  for sedation.  Synchronous cardioversion was performed at 120 joules.  The cardioversion was successful     Complications: No apparent complications Patient did tolerate procedure well.   Holly Simmons, Holly Simmons., MD, [facility] 08/03/2014, 9:01 AM

## 2014-08-09 DIAGNOSIS — E139 Other specified diabetes mellitus without complications: Secondary | ICD-10-CM | POA: Diagnosis not present

## 2014-08-09 DIAGNOSIS — H2513 Age-related nuclear cataract, bilateral: Secondary | ICD-10-CM | POA: Diagnosis not present

## 2014-08-09 LAB — HM DIABETES EYE EXAM

## 2014-08-11 ENCOUNTER — Ambulatory Visit (INDEPENDENT_AMBULATORY_CARE_PROVIDER_SITE_OTHER): Payer: Medicare PPO | Admitting: Pharmacist

## 2014-08-11 DIAGNOSIS — I483 Typical atrial flutter: Secondary | ICD-10-CM

## 2014-08-11 LAB — POCT INR: INR: 3

## 2014-08-22 ENCOUNTER — Ambulatory Visit (INDEPENDENT_AMBULATORY_CARE_PROVIDER_SITE_OTHER): Payer: Medicare PPO | Admitting: *Deleted

## 2014-08-22 DIAGNOSIS — I4892 Unspecified atrial flutter: Secondary | ICD-10-CM

## 2014-08-22 DIAGNOSIS — I483 Typical atrial flutter: Secondary | ICD-10-CM

## 2014-08-22 LAB — POCT INR: INR: 2.8

## 2014-08-31 ENCOUNTER — Other Ambulatory Visit: Payer: Self-pay | Admitting: Internal Medicine

## 2014-08-31 DIAGNOSIS — N6320 Unspecified lump in the left breast, unspecified quadrant: Secondary | ICD-10-CM

## 2014-09-12 NOTE — Progress Notes (Signed)
Patient ID: Holly Simmons, female   DOB: 12/03/1940, 74 y.o.   MRN: [id number] She has a fairly complex history. She has a history of HOCM and underwent surgery at [facility] recently. She was seen in followup by Dr. Rayann Heman 11/15/11 . She underwent myomectomy at [facility] 06/23/11. 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 She underwent cardioversion for atrial flutter and was placed on Coumadin and amiodarone. Epicardial leads are known to have a high threshold.  Echocardiogram 08/27/11: Status post septal myomectomy complicated by VSD now with VSD patch repair-patch covers the basal septal wall and leads to akinesis of the wall within that area, small to moderate residual VSD at the apical end of the patch, EF 50%, moderate diastolic dysfunction, no LVOT or SAM, trivial MR, TR with mildly elevated mean gradient.  Maintaining sinus rhythm and her amiodarone and Coumadin were both discontinued.  Subsequently had new pacer placed 1/15 . Could not cross TVR for stable lead but got good threshholds in CS.   Reviewed echo from1/15  and VSD has closed with overall good EF and normal funcitoning TVR No SAM  12/15 found to be in flutter on pacer interrogation  [facility] with DR Nahser  12/29  Coumadin Rx  CHADVASC 3   Reviewed echo from today and EF ok no LVOT gradient No residual VSD  Gradients across TV a bit high    ROS: Denies fever, malais, weight loss, blurry vision, decreased visual acuity, cough, sputum, SOB, hemoptysis, pleuritic pain, palpitaitons, heartburn, abdominal pain, melena, lower extremity edema, claudication, or rash.  All other systems reviewed and negative  General: Affect appropriate Healthy:  appears stated age 74: normal Neck supple with no adenopathy JVP normal no bruits no thyromegaly Lungs clear with no wheezing  and good diaphragmatic motion Heart:  S1/S2 no murmur, no rub, gallop or click PMI normal Abdomen: benighn, BS positve, no tenderness, no AAA  Epicardial pacer LLQ no bruit.  No HSM or HJR Distal pulses intact with no bruits Plus one  Edema  Lot of varicose veins bilaterally  Neuro non-focal Skin warm and dry No muscular weakness   Current Outpatient Prescriptions  Medication Sig Dispense Refill  . allopurinol (ZYLOPRIM) 100 MG tablet Take 1 tablet (100 mg total) by mouth daily. 90 tablet 3  . Armodafinil (NUVIGIL) 150 MG tablet Take 1 tablet (150 mg total) by mouth as needed. 30 tablet 5  . B Complex-C (B-COMPLEX WITH VITAMIN C) tablet Take 1 tablet by mouth daily.    . budesonide-formoterol (SYMBICORT) 160-4.5 MCG/ACT inhaler Inhale 2 puffs into the lungs 2 (two) times daily. 1 Inhaler 11  . buPROPion (WELLBUTRIN XL) 150 MG 24 hr tablet TAKE ONE TABLET BY MOUTH ONE TIME DAILY  90 tablet 2  . fexofenadine (ALLEGRA) 180 MG tablet Take 180 mg by mouth as needed for allergies.     . furosemide (LASIX) 20 MG tablet Take 40 mg by mouth every other day.     . furosemide (LASIX) 40 MG tablet One by mouth every other day (Patient not taking: Reported on 07/31/2014) 90 tablet 3  . hydrocortisone (ANUSOL-HC) 25 MG suppository Place 25 mg rectally daily as needed for hemorrhoids.    Marland Kitchen linagliptin (TRADJENTA) 5 MG TABS tablet Take 1 tablet (5 mg total) by mouth daily. 90 tablet 3  . [id number]  0.12 % foam APPLY TO SCALP AS DIRECTED (Patient taking differently: APPLY TO SCALP AS DIRECTED PRN) 100 g 11  . metoprolol succinate (TOPROL-XL) 100 MG 24 hr tablet TAKE ONE TABLET BY MOUTH ONE TIME DAILY  90 tablet 1  . Multiple Vitamin (MULTIVITAMIN) tablet Take 1 tablet by mouth daily.    . ranitidine (ZANTAC) 300 MG tablet TAKE ONE TABLET BY MOUTH AT BEDTIME  90 tablet 2  . spironolactone (ALDACTONE) 25 MG tablet Take 12.5 mg by mouth every other day.     . triamcinolone cream (KENALOG) 0.5 % Apply topically 3  (three) times daily as needed.    . Vilazodone HCl (VIIBRYD) 40 MG TABS Take 1 tablet (40 mg total) by mouth daily. 90 tablet 3  . warfarin (COUMADIN) 2.5 MG tablet Take 1 tablet  to 1 and 1/2 tablets daily as directed by coumadin clinic (Patient taking differently: Take 2.5-3.75 mg by mouth See admin instructions. 3.75 mg Tues and Fri, all other days 2.5 mg) 45 tablet 3  . [DISCONTINUED] Alum & Mag Hydroxide-Simeth (MAGIC MOUTHWASH) SOLN Take 5 mLs by mouth 3 (three) times daily. 14 days then stop      No current facility-administered medications for this visit.    Allergies  Sulfonamide derivatives; Tetanus toxoids; Other; and Nsaids  Electrocardiogram:  AV paced rate 79  8/15 12/23  Flutter rate 85  RBBB paced    Assessment and Plan

## 2014-09-13 ENCOUNTER — Encounter: Payer: Self-pay | Admitting: Internal Medicine

## 2014-09-13 ENCOUNTER — Ambulatory Visit (INDEPENDENT_AMBULATORY_CARE_PROVIDER_SITE_OTHER): Payer: Medicare PPO | Admitting: *Deleted

## 2014-09-13 ENCOUNTER — Ambulatory Visit (INDEPENDENT_AMBULATORY_CARE_PROVIDER_SITE_OTHER): Payer: Medicare PPO | Admitting: Cardiovascular Disease

## 2014-09-13 ENCOUNTER — Ambulatory Visit (INDEPENDENT_AMBULATORY_CARE_PROVIDER_SITE_OTHER): Payer: Medicare PPO | Admitting: Internal Medicine

## 2014-09-13 ENCOUNTER — Ambulatory Visit (HOSPITAL_COMMUNITY): Payer: Medicare PPO | Attending: Cardiovascular Disease | Admitting: Radiology

## 2014-09-13 ENCOUNTER — Encounter: Payer: Self-pay | Admitting: Cardiovascular Disease

## 2014-09-13 VITALS — BP 106/64 | HR 92 | Ht 63.0 in | Wt 182.0 lb

## 2014-09-13 VITALS — BP 106/64 | HR 87 | Ht 63.0 in | Wt 182.0 lb

## 2014-09-13 DIAGNOSIS — I4892 Unspecified atrial flutter: Secondary | ICD-10-CM

## 2014-09-13 DIAGNOSIS — Q2542 Hypoplasia of aorta: Secondary | ICD-10-CM

## 2014-09-13 DIAGNOSIS — Q210 Ventricular septal defect: Secondary | ICD-10-CM

## 2014-09-13 DIAGNOSIS — I480 Paroxysmal atrial fibrillation: Secondary | ICD-10-CM

## 2014-09-13 DIAGNOSIS — Q254 Other congenital malformations of aorta: Secondary | ICD-10-CM

## 2014-09-13 DIAGNOSIS — I110 Hypertensive heart disease with heart failure: Secondary | ICD-10-CM

## 2014-09-13 DIAGNOSIS — I429 Cardiomyopathy, unspecified: Secondary | ICD-10-CM | POA: Diagnosis not present

## 2014-09-13 DIAGNOSIS — I421 Obstructive hypertrophic cardiomyopathy: Secondary | ICD-10-CM

## 2014-09-13 DIAGNOSIS — I509 Heart failure, unspecified: Secondary | ICD-10-CM

## 2014-09-13 DIAGNOSIS — I483 Typical atrial flutter: Secondary | ICD-10-CM

## 2014-09-13 DIAGNOSIS — I442 Atrioventricular block, complete: Secondary | ICD-10-CM

## 2014-09-13 DIAGNOSIS — Z950 Presence of cardiac pacemaker: Secondary | ICD-10-CM

## 2014-09-13 LAB — MDC_IDC_ENUM_SESS_TYPE_INCLINIC
Brady Statistic AP VP Percent: 100 %
Brady Statistic AP VS Percent: 0 %
Brady Statistic AS VP Percent: 0 %
Brady Statistic AS VS Percent: 0 %
Date Time Interrogation Session: 20160210115039
Lead Channel Impedance Value: 882 Ohm
Lead Channel Pacing Threshold Amplitude: 0.75 V
Lead Channel Pacing Threshold Amplitude: 0.75 V
Lead Channel Pacing Threshold Pulse Width: 0.4 ms
Lead Channel Sensing Intrinsic Amplitude: 22.4 mV
Lead Channel Sensing Intrinsic Amplitude: 4 mV
Lead Channel Setting Pacing Amplitude: 2 V
Lead Channel Setting Pacing Pulse Width: 0.4 ms
MDC IDC MSMT BATTERY IMPEDANCE: 161 Ohm
MDC IDC MSMT BATTERY REMAINING LONGEVITY: 116 mo
MDC IDC MSMT BATTERY VOLTAGE: 2.79 V
MDC IDC MSMT LEADCHNL RA IMPEDANCE VALUE: 502 Ohm
MDC IDC MSMT LEADCHNL RV PACING THRESHOLD PULSEWIDTH: 0.4 ms
MDC IDC SET LEADCHNL RV PACING AMPLITUDE: 2.5 V
MDC IDC SET LEADCHNL RV SENSING SENSITIVITY: 8 mV

## 2014-09-13 LAB — POCT INR: INR: 2.8

## 2014-09-13 NOTE — Progress Notes (Signed)
Echocardiogram performed.  

## 2014-09-13 NOTE — Assessment & Plan Note (Signed)
Post [facility] for flutter Seeing JA today to interogate device  INR Rx 2.8  Has epicardial leads in with tissue TVR

## 2014-09-13 NOTE — Assessment & Plan Note (Signed)
Post myectomy with complications No LVOT gradient  EF low normal improved

## 2014-09-13 NOTE — Patient Instructions (Signed)
Your physician wants you to follow-up in:  6 MONTHS WITH DR NISHAN  You will receive a reminder letter in the mail two months in advance. If you don't receive a letter, please call our office to schedule the follow-up appointment. Your physician recommends that you continue on your current medications as directed. Please refer to the Current Medication list given to you today. 

## 2014-09-13 NOTE — Assessment & Plan Note (Signed)
Patch area looks mobile but no VSD murmur on exam and no color flow across area  Improved

## 2014-09-13 NOTE — Patient Instructions (Signed)
Your physician wants you to follow-up in: 12 months with Dr Vallery Ridge will receive a reminder letter in the mail two months in advance. If you don't receive a letter, please call our office to schedule the follow-up appointment.    Remote monitoring is used to monitor your Pacemaker or ICD from home. This monitoring reduces the number of office visits required to check your device to one time per year. It allows [place] to keep an eye on the functioning of your device to ensure it is working properly. You are scheduled for a device check from home on 12/13/14. You may send your transmission at any time that day. If you have a wireless device, the transmission will be sent automatically. After your physician reviews your transmission, you will receive a postcard with your next transmission date.

## 2014-09-13 NOTE — Assessment & Plan Note (Signed)
Well controlled.  Continue current medications and low sodium Dash type diet.    

## 2014-09-14 NOTE — Progress Notes (Signed)
Electrophysiology Office Note   Date:  09/14/2014   ID:  Holly Simmons, DOB [birth year], MRN [id number]  PCP:  Scarlette Calico, MD  Cardiologist:  Dr Johnsie Cancel Primary Electrophysiologist: Thompson Grayer, MD    Chief Complaint  Patient presents with  . Follow-up    AFLUTTER     History of Present Illness: Holly Simmons is a 74 y.o. female who presents today for electrophysiology evaluation.   She returns following recent cardioversion.  She remains in sinus rhythm.  She does not notice any significant difference in symptoms.  She is currently doing well.  Today, she denies symptoms of palpitations, chest pain, shortness of breath, orthopnea, PND, lower extremity edema, claudication, dizziness, presyncope, syncope, bleeding, or neurologic sequela. The patient is tolerating medications without difficulties and is otherwise without complaint today.    Past Medical History  Diagnosis Date  . Hypertension   . Hyperpotassemia   . Asthma     NOS w/ acute exacerbation  . Hypersomnia   . Depressive disorder   . Diastolic dysfunction   . Pyuria   . Renal insufficiency   . GERD (gastroesophageal reflux disease)   . COPD (chronic obstructive pulmonary disease)   . Obstructive sleep apnea     persistent daytime sleepiness despite cpap  . Allergic rhinitis   . Psoriasis   . Hypertrophic cardiomyopathy     s/p surgical myomectomy at [facility] 0000000 complicated by septal VSD post procedure requiring reoperation with patch closure and tricuspid valve replacement  . Tricuspid valve replaced     MDT 23mm Mosaic Valve  . Complete heart block     requiring PPM (MDT) post surgical myomectomy at [facility],  leads are epicardial with abdominal implant, high ventricular threshold at implant  . Myocardial infarction 2011  . Heart murmur   . 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 Gout 08/20/2012  . Colon polyps     TUBULAR ADENOMAS AND HYPERPLASTIC  . DM (diabetes mellitus)   . Gallstones   . Kidney stones    . Blood transfusion without reported diagnosis   . Typical atrial flutter 9/15   Past Surgical History  Procedure Laterality Date  . Appendectomy    . Cholecystectomy    . Tonsillectomy    . Cesarean section    . Abdominal hysterectomy    . Septal myomectomy for hypertrophic cm  06/23/11    by Dr Evelina Dun at [facility], complicated by septal VSD requiring patch repair and tricuspid valve replacement  . Tricuspid valve replacement  11/12    Medtronic 55mm Mosaic tissue valve  . Vsd repair  06/2011  . Pacemaker insertion  07/02/11    epicardial wires with abdominal implant at [place] 12/12,  high ventricular lead threshold at implant per Dr Westley Gambles  . Insert / replace / remove pacemaker  07/13/2012  . Colonoscopy    . Polypectomy    . Permanent pacemaker insertion N/A 07/13/2012    Procedure: PERMANENT PACEMAKER INSERTION;  Surgeon: Thompson Grayer, MD;  Location: [facility] CATH LAB;  Service: Cardiovascular;  Laterality: N/A;  . Cardioversion N/A 08/01/2014    Procedure: CARDIOVERSION;  Surgeon: Thayer Headings, MD;  Location: [facility];  Service: Cardiovascular;  Laterality: N/A;     Current Outpatient Prescriptions  Medication Sig Dispense Refill  . allopurinol (ZYLOPRIM) 100 MG tablet Take 1 tablet (100 mg total) by mouth daily. 90 tablet 3  . Armodafinil (NUVIGIL) 150 MG tablet Take 1 tablet (150 mg total) by mouth as needed. (Patient taking differently:  Take 150 mg by mouth daily as needed (sleep disorder). ) 30 tablet 5  . B Complex-C (B-COMPLEX WITH VITAMIN C) tablet Take 1 tablet by mouth daily.    . budesonide-formoterol (SYMBICORT) 160-4.5 MCG/ACT inhaler Inhale 2 puffs into the lungs 2 (two) times daily. 1 Inhaler 11  . buPROPion (WELLBUTRIN XL) 150 MG 24 hr tablet TAKE ONE TABLET BY MOUTH ONE TIME DAILY  90 tablet 2  . fexofenadine (ALLEGRA) 180 MG tablet Take 180 mg by mouth daily as needed for allergies.     . furosemide (LASIX) 40 MG tablet One by mouth every other day (Patient  taking differently: One tablet by mouth every other day) 90 tablet 3  . hydrocortisone (ANUSOL-HC) 25 MG suppository Place 25 mg rectally daily as needed for hemorrhoids.    Marland Kitchen linagliptin (TRADJENTA) 5 MG TABS tablet Take 1 tablet (5 mg total) by mouth daily. 90 tablet 3  . LUXIQ 0.12 % foam APPLY TO SCALP AS DIRECTED 100 g 11  . metoprolol succinate (TOPROL-XL) 100 MG 24 hr tablet TAKE ONE TABLET BY MOUTH ONE TIME DAILY  90 tablet 1  . Multiple Vitamin (MULTIVITAMIN) tablet Take 1 tablet by mouth daily.    . ranitidine (ZANTAC) 300 MG tablet TAKE ONE TABLET BY MOUTH AT BEDTIME  90 tablet 2  . spironolactone (ALDACTONE) 25 MG tablet Take 12.5 mg by mouth every other day.     . triamcinolone cream (KENALOG) 0.5 % Apply 1 application topically 3 (three) times daily as needed (itching).     . Vilazodone HCl (VIIBRYD) 40 MG TABS Take 1 tablet (40 mg total) by mouth daily. 90 tablet 3  . warfarin (COUMADIN) 2.5 MG tablet Take 1 tablet  to 1 and 1/2 tablets daily as directed by coumadin clinic (Patient taking differently: Take 2.5-3.75 mg by mouth See admin instructions. 3.75 mg Tues and Fri, all other days 2.5 mg) 45 tablet 3  . [DISCONTINUED] Alum & Mag Hydroxide-Simeth (MAGIC MOUTHWASH) SOLN Take 5 mLs by mouth 3 (three) times daily. 14 days then stop      No current facility-administered medications for this visit.    Allergies:   Sulfonamide derivatives; Tetanus toxoids; Other; and Nsaids   Social History:  The patient  reports that she has never smoked. She has never used smokeless tobacco. She reports that she does not drink alcohol or use illicit drugs.   Family History:  The patient's family history includes Bladder Cancer in her father; Breast cancer in her mother and paternal aunt; Dementia in her maternal aunt, maternal uncle, and mother; Heart disease in her maternal grandfather and paternal grandfather; Hypertension in her mother; Ovarian cancer in her maternal aunt; Pancreatic cancer in  her cousin; Prostate cancer in her father. There is no history of Colon cancer.    ROS:  Please see the history of present illness.   All other systems are reviewed and negative.    PHYSICAL EXAM: VS:  BP 106/64 mmHg  Pulse 87  Ht 5\' 3"  (1.6 m)  Wt 182 lb (82.555 kg)  BMI 32.25 kg/m2 , BMI Body mass index is 32.25 kg/(m^2). GEN: Well nourished, well developed, in no acute distress HEENT: normal Neck: no JVD, carotid bruits, or masses Cardiac: RRR; no murmurs, rubs, or gallops,no edema  Respiratory:  clear to auscultation bilaterally, normal work of breathing GI: soft, nontender, nondistended, + BS MS: no deformity or atrophy Skin: warm and dry, device pocket is well healed Neuro:  Strength and sensation  are intact Psych: euthymic mood, full affect  EKG:  EKG is ordered today. The ekg ordered today shows sinus rhythm with V pacing  Device interrogation is reviewed today in detail.  See PaceArt for details.   Recent Labs: 05/24/2014: TSH 1.54 07/26/2014: BUN 20; Creatinine 1.2; Hemoglobin 12.4; Platelets 167.0; Potassium 3.9; Sodium 137    Lipid Panel     Component Value Date/Time   CHOL 135 05/24/2014 1404   TRIG 80.0 05/24/2014 1404   HDL 49.80 05/24/2014 1404   CHOLHDL 3 05/24/2014 1404   VLDL 16.0 05/24/2014 1404   LDLCALC 69 05/24/2014 1404     Wt Readings from Last 3 Encounters:  09/13/14 182 lb (82.555 kg)  09/13/14 182 lb (82.555 kg)  07/26/14 181 lb 3.2 oz (82.192 kg)      Other studies Reviewed: Additional studies/ records that were reviewed today include: Dr Mariana Arn note and recent cardioversion   ASSESSMENT AND PLAN:   1. atrial flutter Maintaining sinus rhythm post cardioversion.  Her chads2vasc score is at least 3.  Continue anticoagulation with coumadin.  She has valvular heart disease and therefore she is not an ideal candidate for NOAC therapy.  Given her h/o HCM and prior valvular surgery, I think that her longterm risks of atrial  arrhythmias is high.  Long term anticoagulation would therefore not be unreasonable.     2. Complete heart block Normal pacemaker function  See Pace Art report.  3. Hypertensive cardiovascular disease Stable No change required today  Continue carelink remote transmissions Return to see me in 29months unless further arrhythmias arise Follow-up with Dr Johnsie Cancel as scheduled.     Current medicines are reviewed at length with the patient today.   The patient does not have concerns regarding her medicines.  The following changes were made today:  none  Signed, Thompson Grayer, MD  09/14/2014 9:04 PM     [place] [address] [phone] (office) [phone] (fax)

## 2014-09-19 ENCOUNTER — Ambulatory Visit
Admission: RE | Admit: 2014-09-19 | Discharge: 2014-09-19 | Disposition: A | Discharge status: Home / Self Care | Payer: Medicare PPO | Source: Ambulatory Visit | Attending: Internal Medicine | Admitting: Internal Medicine

## 2014-09-19 DIAGNOSIS — N6320 Unspecified lump in the left breast, unspecified quadrant: Secondary | ICD-10-CM

## 2014-09-19 LAB — HM MAMMOGRAPHY

## 2014-09-27 ENCOUNTER — Ambulatory Visit (INDEPENDENT_AMBULATORY_CARE_PROVIDER_SITE_OTHER): Payer: Medicare PPO | Admitting: Internal Medicine

## 2014-09-27 ENCOUNTER — Encounter: Payer: Self-pay | Admitting: Internal Medicine

## 2014-09-27 ENCOUNTER — Other Ambulatory Visit (INDEPENDENT_AMBULATORY_CARE_PROVIDER_SITE_OTHER): Payer: Medicare PPO

## 2014-09-27 VITALS — BP 130/76 | HR 88 | Temp 97.7°F | Resp 16 | Wt 181.0 lb

## 2014-09-27 DIAGNOSIS — M1A09X0 Idiopathic chronic gout, multiple sites, without tophus (tophi): Secondary | ICD-10-CM

## 2014-09-27 DIAGNOSIS — E1121 Type 2 diabetes mellitus with diabetic nephropathy: Secondary | ICD-10-CM

## 2014-09-27 DIAGNOSIS — R778 Other specified abnormalities of plasma proteins: Secondary | ICD-10-CM

## 2014-09-27 DIAGNOSIS — I509 Heart failure, unspecified: Secondary | ICD-10-CM

## 2014-09-27 DIAGNOSIS — L97909 Non-pressure chronic ulcer of unspecified part of unspecified lower leg with unspecified severity: Secondary | ICD-10-CM

## 2014-09-27 DIAGNOSIS — I83009 Varicose veins of unspecified lower extremity with ulcer of unspecified site: Secondary | ICD-10-CM

## 2014-09-27 DIAGNOSIS — N259 Disorder resulting from impaired renal tubular function, unspecified: Secondary | ICD-10-CM

## 2014-09-27 DIAGNOSIS — I110 Hypertensive heart disease with heart failure: Secondary | ICD-10-CM

## 2014-09-27 DIAGNOSIS — R310 Gross hematuria: Secondary | ICD-10-CM

## 2014-09-27 DIAGNOSIS — H109 Unspecified conjunctivitis: Secondary | ICD-10-CM

## 2014-09-27 LAB — URINALYSIS, ROUTINE W REFLEX MICROSCOPIC
Bilirubin Urine: NEGATIVE
Ketones, ur: NEGATIVE
NITRITE: NEGATIVE
SPECIFIC GRAVITY, URINE: 1.01 (ref 1.000–1.030)
Total Protein, Urine: NEGATIVE
Urine Glucose: NEGATIVE
Urobilinogen, UA: 0.2 (ref 0.0–1.0)
pH: 6.5 (ref 5.0–8.0)

## 2014-09-27 LAB — COMPREHENSIVE METABOLIC PANEL
ALBUMIN: 4.5 g/dL (ref 3.5–5.2)
ALT: 17 U/L (ref 0–35)
AST: 24 U/L (ref 0–37)
Alkaline Phosphatase: 178 U/L — ABNORMAL HIGH (ref 39–117)
BILIRUBIN TOTAL: 0.7 mg/dL (ref 0.2–1.2)
BUN: 17 mg/dL (ref 6–23)
CO2: 32 mEq/L (ref 19–32)
CREATININE: 1.19 mg/dL (ref 0.40–1.20)
Calcium: 10.7 mg/dL — ABNORMAL HIGH (ref 8.4–10.5)
Chloride: 96 mEq/L (ref 96–112)
GFR: 47.5 mL/min — AB (ref >60.00)
GLUCOSE: 106 mg/dL — AB (ref 70–99)
POTASSIUM: 4 meq/L (ref 3.5–5.1)
Sodium: 136 mEq/L (ref 135–145)
Total Protein: 8.5 g/dL — ABNORMAL HIGH (ref 6.0–8.3)

## 2014-09-27 LAB — MAGNESIUM: MAGNESIUM: 2.3 mg/dL (ref 1.5–2.5)

## 2014-09-27 LAB — TSH: TSH: 2.72 u[IU]/mL (ref 0.35–4.50)

## 2014-09-27 LAB — HEMOGLOBIN A1C: HEMOGLOBIN A1C: 6.5 % (ref 4.6–6.5)

## 2014-09-27 LAB — URIC ACID: Uric Acid, Serum: 6 mg/dL (ref 2.4–7.0)

## 2014-09-27 MED ORDER — TOBRAMYCIN-DEXAMETHASONE 0.3-0.1 % OP SUSP: 2.0000 [drp] | Freq: Four times a day (QID) | OPHTHALMIC | Status: DC

## 2014-09-27 NOTE — Assessment & Plan Note (Signed)
Her BP is well controlled 

## 2014-09-27 NOTE — Assessment & Plan Note (Signed)
I will recheck her A1C and will monitor her renal fucntion

## 2014-09-27 NOTE — Assessment & Plan Note (Signed)
Will recheck her TP level today, will investigate further is indicated

## 2014-09-27 NOTE — Assessment & Plan Note (Signed)
I will recheck her uric acid level, goal is <6

## 2014-09-27 NOTE — Progress Notes (Signed)
Pre visit review using our clinic review tool, if applicable. No additional management support is needed unless otherwise documented below in the visit note. 

## 2014-09-27 NOTE — Patient Instructions (Signed)

## 2014-09-27 NOTE — Progress Notes (Signed)
Subjective:    Patient ID: Holly Simmons, female    DOB: 1941/01/26, 74 y.o.   MRN: [id number]  Hypertension Pertinent negatives include no headaches, neck pain or palpitations.  Conjunctivitis  The current episode started today. The onset was gradual. The problem occurs frequently. The problem has been unchanged. The problem is moderate. Nothing relieves the symptoms. Associated symptoms include eye itching and eye redness. Pertinent negatives include no fever, no decreased vision, no double vision, no photophobia, no abdominal pain, no constipation, no diarrhea, no nausea, no vomiting, no congestion, no ear discharge, no ear pain, no headaches, no hearing loss, no mouth sores, no rhinorrhea, no sore throat, no stridor, no swollen glands, no muscle aches, no neck pain, no cough, no URI, no wheezing, no rash, no eye discharge and no eye pain. There were no sick contacts.      Review of Systems  Constitutional: Negative.  Negative for fever, chills, diaphoresis and fatigue.  HENT: Negative.  Negative for congestion, ear discharge, ear pain, hearing loss, mouth sores, rhinorrhea and sore throat.   Eyes: Positive for redness and itching. Negative for double vision, photophobia, pain and discharge.  Respiratory: Negative.  Negative for cough, choking, chest tightness, wheezing and stridor.   Cardiovascular: Negative.  Negative for palpitations and leg swelling.  Gastrointestinal: Negative.  Negative for nausea, vomiting, abdominal pain, diarrhea and constipation.  Endocrine: Negative.   Genitourinary: Negative.  Negative for difficulty urinating.  Musculoskeletal: Negative.  Negative for neck pain.       She is concerned about large varicose veins on both LE's  Skin: Negative.  Negative for rash.  Allergic/Immunologic: Negative.   Neurological: Negative.  Negative for dizziness and headaches.  Hematological: Negative.   Psychiatric/Behavioral: Negative.        Objective:   Physical Exam  Constitutional: She is oriented to person, place, and time. She appears well-developed and well-nourished.  Non-toxic appearance. She does not have a sickly appearance. She does not appear ill. No distress.  HENT:  Head: Normocephalic and atraumatic.  Mouth/Throat: Oropharynx is clear and moist. No oropharyngeal exudate.  Eyes: EOM are normal. Pupils are equal, round, and reactive to light. Right eye exhibits no chemosis, no discharge, no exudate and no hordeolum. No foreign body present in the right eye. Left eye exhibits chemosis. Left eye exhibits no discharge, no exudate and no hordeolum. No foreign body present in the left eye. Right conjunctiva is injected. Right conjunctiva has no hemorrhage. Left conjunctiva is not injected. Left conjunctiva has no hemorrhage. No scleral icterus.    Neck: Normal range of motion. Neck supple. No JVD present. No tracheal deviation present. No thyromegaly present.  Cardiovascular: Normal rate, regular rhythm and intact distal pulses.  Exam reveals no gallop, no S3, no S4 and no friction rub.   Murmur heard. Pulmonary/Chest: Effort normal and breath sounds normal. No stridor. No respiratory distress. She has no wheezes. She has no rales. She exhibits no tenderness.  Abdominal: Soft. Bowel sounds are normal. She exhibits no distension and no mass. There is no tenderness. There is no rebound and no guarding.  Musculoskeletal: Normal range of motion. She exhibits no edema or tenderness.  Lymphadenopathy:    She has no cervical adenopathy.  Neurological: She is oriented to person, place, and time.  Skin: Skin is warm and dry. No rash noted. She is not diaphoretic. No erythema. No pallor.  Vitals reviewed.    Lab Results  Component Value Date   WBC 7.7  07/26/2014   HGB 12.4 07/26/2014   HCT 38.4 07/26/2014   PLT 167.0 07/26/2014   GLUCOSE 155* 07/26/2014   CHOL 135 05/24/2014   TRIG 80.0 05/24/2014   HDL 49.80 05/24/2014   LDLCALC 69  05/24/2014   ALT 14 01/06/2013   AST 18 01/06/2013   NA 137 07/26/2014   K 3.9 07/26/2014   CL 102 07/26/2014   CREATININE 1.2 07/26/2014   BUN 20 07/26/2014   CO2 30 07/26/2014   TSH 1.54 05/24/2014   INR 2.8 09/13/2014   HGBA1C 6.3* 04/04/2014   MICROALBUR 32.8* 05/24/2014       Assessment & Plan:

## 2014-09-27 NOTE — Assessment & Plan Note (Signed)
This appears to be a combo of viral and allergic Will treat with tobradex ophth susp

## 2014-10-03 ENCOUNTER — Other Ambulatory Visit: Payer: Self-pay | Admitting: Internal Medicine

## 2014-10-03 ENCOUNTER — Other Ambulatory Visit: Payer: Self-pay | Admitting: *Deleted

## 2014-10-03 DIAGNOSIS — I83009 Varicose veins of unspecified lower extremity with ulcer of unspecified site: Secondary | ICD-10-CM

## 2014-10-03 DIAGNOSIS — L97909 Non-pressure chronic ulcer of unspecified part of unspecified lower leg with unspecified severity: Principal | ICD-10-CM

## 2014-10-11 ENCOUNTER — Ambulatory Visit (INDEPENDENT_AMBULATORY_CARE_PROVIDER_SITE_OTHER): Payer: Medicare PPO | Admitting: *Deleted

## 2014-10-11 DIAGNOSIS — I483 Typical atrial flutter: Secondary | ICD-10-CM

## 2014-10-11 DIAGNOSIS — I4892 Unspecified atrial flutter: Secondary | ICD-10-CM

## 2014-10-11 LAB — POCT INR: INR: 2.6

## 2014-10-18 ENCOUNTER — Ambulatory Visit: Payer: Medicare PPO | Admitting: Internal Medicine

## 2014-10-25 ENCOUNTER — Other Ambulatory Visit: Payer: Self-pay | Admitting: Internal Medicine

## 2014-11-01 ENCOUNTER — Other Ambulatory Visit: Payer: Self-pay | Admitting: Internal Medicine

## 2014-11-13 ENCOUNTER — Encounter (HOSPITAL_COMMUNITY): Payer: Medicare PPO

## 2014-11-13 ENCOUNTER — Encounter: Payer: Medicare PPO | Admitting: Surgery

## 2014-11-14 ENCOUNTER — Encounter: Payer: Self-pay | Admitting: Vascular Surgery

## 2014-11-15 ENCOUNTER — Ambulatory Visit (INDEPENDENT_AMBULATORY_CARE_PROVIDER_SITE_OTHER): Payer: Medicare PPO | Admitting: Vascular Surgery

## 2014-11-15 ENCOUNTER — Ambulatory Visit (HOSPITAL_COMMUNITY)
Admission: RE | Admit: 2014-11-15 | Discharge: 2014-11-15 | Disposition: A | Discharge status: Home / Self Care | Payer: Medicare PPO | Source: Ambulatory Visit | Attending: Vascular Surgery | Admitting: Vascular Surgery

## 2014-11-15 ENCOUNTER — Encounter: Payer: Self-pay | Admitting: Vascular Surgery

## 2014-11-15 VITALS — BP 124/59 | HR 70 | Ht 63.0 in | Wt 182.0 lb

## 2014-11-15 DIAGNOSIS — I83009 Varicose veins of unspecified lower extremity with ulcer of unspecified site: Secondary | ICD-10-CM | POA: Diagnosis not present

## 2014-11-15 DIAGNOSIS — I872 Venous insufficiency (chronic) (peripheral): Secondary | ICD-10-CM | POA: Diagnosis not present

## 2014-11-15 DIAGNOSIS — L97909 Non-pressure chronic ulcer of unspecified part of unspecified lower leg with unspecified severity: Secondary | ICD-10-CM

## 2014-11-15 NOTE — Progress Notes (Signed)
[facility] [place]  Patient name: Holly Simmons MRN: [id number] DOB: [birth year] Sex: female  REASON FOR CONSULT: Chronic venous insufficiency  HPI: Holly Simmons is a 74 y.o. female we states that she has had problems with varicose veins since she was a teenager. Her symptoms have become progressively worse and her varicose veins have become more prominent. She had heart surgery at [facility] several years ago which is when she began having increasing aching pain in her legs which is associated with standing and relieved with elevation. There are no other aggravating or alleviating factors. She denies any previous history of DVT. She denies any previous history of phlebitis. Had 1 bleeding episode associated with her varicose veins but this was related to cutting the vein with a razor by accident. She has not had previous surgery on her veins. She has worn compression stockings in the remote past but did not find them comfortable.   Past Medical History  Diagnosis Date  . Hypertension   . Hyperpotassemia   . Asthma     NOS w/ acute exacerbation  . Hypersomnia   . Depressive disorder   . Diastolic dysfunction   . Pyuria   . Renal insufficiency   . GERD (gastroesophageal reflux disease)   . COPD (chronic obstructive pulmonary disease)   . Obstructive sleep apnea     persistent daytime sleepiness despite cpap  . Allergic rhinitis   . Psoriasis   . Hypertrophic cardiomyopathy     s/p surgical myomectomy at [facility] 0000000 complicated by septal VSD post procedure requiring reoperation with patch closure and tricuspid valve replacement  . Tricuspid valve replaced     MDT 41mm Mosaic Valve  . Complete heart block     requiring PPM (MDT) post surgical myomectomy at [facility],  leads are epicardial with abdominal implant, high ventricular threshold at implant  . Myocardial infarction 2011  . Heart murmur   . 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 Gout 08/20/2012  . Colon polyps       TUBULAR ADENOMAS AND HYPERPLASTIC  . DM (diabetes mellitus)   . Gallstones   . Kidney stones   . Blood transfusion without reported diagnosis   . Typical atrial flutter 9/15   Family History  Problem Relation Age of Onset  . Bladder Cancer Father   . Prostate cancer Father   . Cancer Father   . Varicose Veins Father   . Heart attack Father   . Ovarian cancer Maternal Aunt   . Heart disease Maternal Grandfather   . Heart disease Paternal Grandfather   . Breast cancer Mother   . Dementia Mother   . Hypertension Mother   . Cancer Mother   . Pancreatic cancer Cousin   . Breast cancer Paternal Aunt   . Dementia Maternal Aunt     x 2  . Dementia Maternal Uncle     x 2  . Colon cancer Neg Hx   . Hypertension Daughter    SOCIAL HISTORY: History  Substance Use Topics  . Smoking status: Never Smoker   . Smokeless tobacco: Never Used  . Alcohol Use: No   Allergies  Allergen Reactions  . Sulfonamide Derivatives Anaphylaxis and Swelling  . Tetanus Toxoids Swelling  . Other Rash    STERI - STRIPS   . Nsaids Other (See Comments)    REACTION: proteinuria   Current Outpatient Prescriptions  Medication Sig Dispense Refill  . allopurinol (ZYLOPRIM) 100 MG tablet TAKE ONE TABLET BY MOUTH  ONE TIME DAILY  90 tablet 2  . Armodafinil (NUVIGIL) 150 MG tablet Take 1 tablet (150 mg total) by mouth as needed. (Patient taking differently: Take 150 mg by mouth daily as needed (sleep disorder). ) 30 tablet 5  . B Complex-C (B-COMPLEX WITH VITAMIN C) tablet Take 1 tablet by mouth daily.    . budesonide-formoterol (SYMBICORT) 160-4.5 MCG/ACT inhaler Inhale 2 puffs into the lungs 2 (two) times daily. 1 Inhaler 11  . buPROPion (WELLBUTRIN XL) 150 MG 24 hr tablet TAKE ONE TABLET BY MOUTH ONE TIME DAILY  90 tablet 2  . fexofenadine (ALLEGRA) 180 MG tablet Take 180 mg by mouth daily as needed for allergies.     . furosemide (LASIX) 40 MG tablet TAKE ONE TABLET BY MOUTH EVERY OTHER DAY  45 tablet  2  . hydrocortisone (ANUSOL-HC) 25 MG suppository Place 25 mg rectally daily as needed for hemorrhoids.    . LUXIQ 0.12 % foam APPLY TO SCALP AS DIRECTED 100 g 11  . metoprolol succinate (TOPROL-XL) 100 MG 24 hr tablet TAKE ONE TABLET BY MOUTH ONE TIME DAILY  90 tablet 2  . Multiple Vitamin (MULTIVITAMIN) tablet Take 1 tablet by mouth daily.    . ranitidine (ZANTAC) 300 MG tablet TAKE ONE TABLET BY MOUTH AT BEDTIME  90 tablet 2  . spironolactone (ALDACTONE) 25 MG tablet Take 12.5 mg by mouth every other day.     . TRADJENTA 5 MG TABS tablet TAKE ONE TABLET BY MOUTH ONE TIME DAILY  90 tablet 2  . triamcinolone cream (KENALOG) 0.5 % Apply 1 application topically 3 (three) times daily as needed (itching).     . Vilazodone HCl (VIIBRYD) 40 MG TABS Take 1 tablet (40 mg total) by mouth daily. 90 tablet 3  . warfarin (COUMADIN) 2.5 MG tablet TAKE ONE TO ONE  AND ONE-HALF TABLETS BY MOUTH DAILY as directed by coumadin clinic 45 tablet 3  . spironolactone (ALDACTONE) 25 MG tablet TAKE ONE-HALF TABLET (12.5 MG TOTAL) BY MOUTH ONE TIME DAILY (Patient not taking: Reported on 11/15/2014) 45 tablet 2  . tobramycin-dexamethasone (TOBRADEX) ophthalmic solution Place 2 drops into both eyes every 6 (six) hours. (Patient not taking: Reported on 11/15/2014) 5 mL 0  . [DISCONTINUED] Alum & Mag Hydroxide-Simeth (MAGIC MOUTHWASH) SOLN Take 5 mLs by mouth 3 (three) times daily. 14 days then stop      No current facility-administered medications for this visit.   REVIEW OF SYSTEMS: Valu.Nieves ] denotes positive finding; [  ] denotes negative finding  CARDIOVASCULAR:  [ ]  chest pain   [ ]  chest pressure   [ ]  palpitations   [ ]  orthopnea   [ ]  dyspnea on exertion   [ ]  claudication   [ ]  rest pain   [ ]  DVT   [ ]  phlebitis PULMONARY:   [ ]  productive cough   [ ]  asthma   [ ]  wheezing NEUROLOGIC:   [ ]  weakness  [ ]  paresthesias  [ ]  aphasia  [ ]  amaurosis  [ ]  dizziness HEMATOLOGIC:   Valu.Nieves ] bleeding problems   [ ]  clotting  disorders MUSCULOSKELETAL:  [ ]  joint pain   [ ]  joint swelling Valu.Nieves ] leg swelling GASTROINTESTINAL: [ ]   blood in stool  [ ]   hematemesis GENITOURINARY:  [ ]   dysuria  [ ]   hematuria PSYCHIATRIC:  [ ]  history of major depression INTEGUMENTARY:  [ ]  rashes  [ ]  ulcers CONSTITUTIONAL:  [ ]  fever   [ ]   chills  PHYSICAL EXAM: Filed Vitals:   11/15/14 1236  BP: 124/59  Pulse: 70  Height: 5\' 3"  (1.6 m)  Weight: 182 lb (82.555 kg)  SpO2: 97%   GENERAL: The patient is a well-nourished female, in no acute distress. The vital signs are documented above. CARDIOVASCULAR: There is a regular rate and rhythm. I do not detect carotid bruits. She has palpable posterior tibial pulses bilaterally. She has mild bilateral lower extremity swelling. PULMONARY: There is good air exchange bilaterally without wheezing or rales. ABDOMEN: Soft and non-tender with normal pitched bowel sounds.  MUSCULOSKELETAL: There are no major deformities or cyanosis. NEUROLOGIC: No focal weakness or paresthesias are detected. SKIN: she has some mild varicose veins in both lower extremity both anteriorly and posteriorly both inner thighs and inner legs. She also has telangiectasias in both lower extremities that are quite extensive.Marland Kitchen PSYCHIATRIC: The patient has a normal affect.  DATA:  I have independently interpreted her venous duplex scan of the right lower extremity. It shows that she does have deep vein reflux on the right. There is no evidence of DVT. She also has significant reflux in her right greater saphenous vein.  On the left side she has deep vein reflux. There is no evidence of DVT. She only has reflux in the saphenofemoral junction on the left. There is no reflux in the greater saphenous vein.  MEDICAL ISSUES:  CHRONIC VENOUS INSUFFICIENCY: This patient has deep vein reflux bilaterally and reflux in her right greater saphenous vein. We have discussed the importance of intermittent leg elevation in the proper  positioning for this. In addition, I have given her a prescription for knee-high compression stockings with a gradient of 15-20 mmHg. I thought that the milder gradient might be more tolerable for her. We also discussed the importance of avoiding prolonged sitting and standing. I encouraged her to walk as much as possible. I encouraged her to consider water aerobics. If her symptoms progressed and she could be considered for thigh-high compression stockings with a gradient of 20-30 mmHg. If this were not successful, then she could be considered for laser ablation of the right greater saphenous vein. She agrees that a conservative treatment is best currently area we will see her back as needed.   [facility] [place] Beeper: [phone]

## 2014-11-22 ENCOUNTER — Ambulatory Visit (INDEPENDENT_AMBULATORY_CARE_PROVIDER_SITE_OTHER): Payer: Medicare PPO | Admitting: *Deleted

## 2014-11-22 DIAGNOSIS — I483 Typical atrial flutter: Secondary | ICD-10-CM

## 2014-11-22 DIAGNOSIS — Z5181 Encounter for therapeutic drug level monitoring: Secondary | ICD-10-CM | POA: Diagnosis not present

## 2014-11-22 DIAGNOSIS — I4892 Unspecified atrial flutter: Secondary | ICD-10-CM

## 2014-11-22 LAB — POCT INR: INR: 3.3

## 2014-12-07 ENCOUNTER — Other Ambulatory Visit: Payer: Self-pay | Admitting: Pulmonary Disease

## 2014-12-11 ENCOUNTER — Telehealth: Payer: Self-pay | Admitting: *Deleted

## 2014-12-11 NOTE — Telephone Encounter (Signed)
Patient called to inform us that she is having a tooth extraction in the near future. They have not set a date for the extraction. Advised that Coumadin is not generally held for a single extraction.  Advised to notify the Dentist that she is Coumadin and if they need her to hold Coumadin they need to contact us directly so we can obtain approval to hold by her Cardiologist.  She verbalized understanding.  Advised that if she would like change her [facility] appointment to a earlier date she could but she declined.

## 2014-12-13 ENCOUNTER — Ambulatory Visit (INDEPENDENT_AMBULATORY_CARE_PROVIDER_SITE_OTHER): Payer: Medicare PPO | Admitting: *Deleted

## 2014-12-13 ENCOUNTER — Encounter: Payer: Self-pay | Admitting: Internal Medicine

## 2014-12-13 DIAGNOSIS — I495 Sick sinus syndrome: Secondary | ICD-10-CM | POA: Diagnosis not present

## 2014-12-13 NOTE — Progress Notes (Signed)
Remote pacemaker transmission.   

## 2014-12-14 LAB — CUP PACEART REMOTE DEVICE CHECK
Battery Impedance: 185 Ohm
Battery Remaining Longevity: 110 mo
Brady Statistic AP VP Percent: 100 %
Brady Statistic AS VS Percent: 0 %
Date Time Interrogation Session: 20160511102042
Lead Channel Pacing Threshold Amplitude: 0.875 V
Lead Channel Pacing Threshold Pulse Width: 0.4 ms
Lead Channel Setting Pacing Amplitude: 2.5 V
Lead Channel Setting Pacing Pulse Width: 0.4 ms
Lead Channel Setting Sensing Sensitivity: 8 mV
MDC IDC MSMT BATTERY VOLTAGE: 2.79 V
MDC IDC MSMT LEADCHNL RA IMPEDANCE VALUE: 473 Ohm
MDC IDC MSMT LEADCHNL RV IMPEDANCE VALUE: 852 Ohm
MDC IDC MSMT LEADCHNL RV PACING THRESHOLD AMPLITUDE: 0.75 V
MDC IDC MSMT LEADCHNL RV PACING THRESHOLD PULSEWIDTH: 0.4 ms
MDC IDC SET LEADCHNL RA PACING AMPLITUDE: 2 V
MDC IDC STAT BRADY AP VS PERCENT: 0 %
MDC IDC STAT BRADY AS VP PERCENT: 0 %

## 2014-12-27 ENCOUNTER — Encounter: Payer: Self-pay | Admitting: Cardiology

## 2014-12-27 ENCOUNTER — Ambulatory Visit (INDEPENDENT_AMBULATORY_CARE_PROVIDER_SITE_OTHER): Payer: Medicare PPO

## 2014-12-27 VITALS — BP 140/64 | Ht 63.0 in | Wt 184.0 lb

## 2014-12-27 DIAGNOSIS — Z0000 Encounter for general adult medical examination without abnormal findings: Secondary | ICD-10-CM | POA: Diagnosis not present

## 2014-12-27 NOTE — Patient Instructions (Addendum)
Holly Simmons , Thank you for taking time to come for your Medicare Wellness Visit. I appreciate your ongoing commitment to your health goals. Please review the following plan we discussed and let me know if I can assist you in the future.   These are the goals we discussed: Goals    . Record weight daily     Call back on snacks; 3 or 4 times (cookies, ice cream, yogurt)  Snacks; HFCS (high fructose corn syrup)  Low sodium; warm lemon water in morning;        This is a list of the screening recommended for you and due dates:  Health Maintenance  Topic Date Due  . Tetanus Vaccine  07/19/1962  . Shingles Vaccine  07/20/2003  . Flu Shot  03/05/2015  . Hemoglobin A1C  03/28/2015  . Urine Protein Check  05/25/2015  . Eye exam for diabetics  08/10/2015  . Complete foot exam   09/28/2015  . Mammogram  09/19/2016  . Colon Cancer Screening  05/08/2019  . DEXA scan (bone density measurement)  Completed  . Pneumonia vaccines  Completed   Screens up to date Can't take tetanus due to allergy;  shingles given by albermarle by primary doctor which has retired at present; no record available.      Health Maintenance Adopting a healthy lifestyle and getting preventive care can go a long way to promote health and wellness. Talk with your health care provider about what schedule of regular examinations is right for you. This is a good chance for you to check in with your provider about disease prevention and staying healthy. In between checkups, there are plenty of things you can do on your own. Experts have done a lot of research about which lifestyle changes and preventive measures are most likely to keep you healthy. Ask your health care provider for more information. WEIGHT AND DIET  Eat a healthy diet  Be sure to include plenty of vegetables, fruits, low-fat dairy products, and lean protein.  Do not eat a lot of foods high in solid fats, added sugars, or salt.  Get regular exercise.  This is one of the most important things you can do for your health.  Most adults should exercise for at least 150 minutes each week. The exercise should increase your heart rate and make you sweat (moderate-intensity exercise).  Most adults should also do strengthening exercises at least twice a week. This is in addition to the moderate-intensity exercise.  Maintain a healthy weight  Body mass index (BMI) is a measurement that can be used to identify possible weight problems. It estimates body fat based on height and weight. Your health care provider can help determine your BMI and help you achieve or maintain a healthy weight.  For females 28 years of age and older:   A BMI below 18.5 is considered underweight.  A BMI of 18.5 to 24.9 is normal.  A BMI of 25 to 29.9 is considered overweight.  A BMI of 30 and above is considered obese.  Watch levels of cholesterol and blood lipids  You should start having your blood tested for lipids and cholesterol at 72 years of age, then have this test every 5 years.  You may need to have your cholesterol levels checked more often if:  Your lipid or cholesterol levels are high.  You are older than 72 years of age.  You are at high risk for heart disease.  CANCER SCREENING   Lung Cancer  Lung cancer screening is recommended for adults 9-33 years old who are at high risk for lung cancer because of a history of smoking.  A yearly low-dose CT scan of the lungs is recommended for people who:  Currently smoke.  Have quit within the past 15 years.  Have at least a 30-pack-year history of smoking. A pack year is smoking an average of one pack of cigarettes a day for 1 year.  Yearly screening should continue until it has been 15 years since you quit.  Yearly screening should stop if you develop a health problem that would prevent you from having lung cancer treatment.  Breast Cancer  Practice breast self-awareness. This means  understanding how your breasts normally appear and feel.  It also means doing regular breast self-exams. Let your health care provider know about any changes, no matter how small.  If you are in your 20s or 30s, you should have a clinical breast exam (CBE) by a health care provider every 1-3 years as part of a regular health exam.  If you are 87 or older, have a CBE every year. Also consider having a breast X-ray (mammogram) every year.  If you have a family history of breast cancer, talk to your health care provider about genetic screening.  If you are at high risk for breast cancer, talk to your health care provider about having an MRI and a mammogram every year.  Breast cancer gene (BRCA) assessment is recommended for women who have family members with BRCA-related cancers. BRCA-related cancers include:  Breast.  Ovarian.  Tubal.  Peritoneal cancers.  Results of the assessment will determine the need for genetic counseling and BRCA1 and BRCA2 testing. Cervical Cancer Routine pelvic examinations to screen for cervical cancer are no longer recommended for nonpregnant women who are considered low risk for cancer of the pelvic organs (ovaries, uterus, and vagina) and who do not have symptoms. A pelvic examination may be necessary if you have symptoms including those associated with pelvic infections. Ask your health care provider if a screening pelvic exam is right for you.   The Pap test is the screening test for cervical cancer for women who are considered at risk.  If you had a hysterectomy for a problem that was not cancer or a condition that could lead to cancer, then you no longer need Pap tests.  If you are older than 65 years, and you have had normal Pap tests for the past 10 years, you no longer need to have Pap tests.  If you have had past treatment for cervical cancer or a condition that could lead to cancer, you need Pap tests and screening for cancer for at least 20 years  after your treatment.  If you no longer get a Pap test, assess your risk factors if they change (such as having a new sexual partner). This can affect whether you should start being screened again.  Some women have medical problems that increase their chance of getting cervical cancer. If this is the case for you, your health care provider may recommend more frequent screening and Pap tests.  The human papillomavirus (HPV) test is another test that may be used for cervical cancer screening. The HPV test looks for the virus that can cause cell changes in the cervix. The cells collected during the Pap test can be tested for HPV.  The HPV test can be used to screen women 71 years of age and older. Getting tested for HPV can  extend the interval between normal Pap tests from three to five years.  An HPV test also should be used to screen women of any age who have unclear Pap test results.  After 72 years of age, women should have HPV testing as often as Pap tests.  Colorectal Cancer  This type of cancer can be detected and often prevented.  Routine colorectal cancer screening usually begins at 72 years of age and continues through 72 years of age.  Your health care provider may recommend screening at an earlier age if you have risk factors for colon cancer.  Your health care provider may also recommend using home test kits to check for hidden blood in the stool.  A small camera at the end of a tube can be used to examine your colon directly (sigmoidoscopy or colonoscopy). This is done to check for the earliest forms of colorectal cancer.  Routine screening usually begins at age 103.  Direct examination of the colon should be repeated every 5-10 years through 72 years of age. However, you may need to be screened more often if early forms of precancerous polyps or small growths are found. Skin Cancer  Check your skin from head to toe regularly.  Tell your health care provider about any new  moles or changes in moles, especially if there is a change in a mole's shape or color.  Also tell your health care provider if you have a mole that is larger than the size of a pencil eraser.  Always use sunscreen. Apply sunscreen liberally and repeatedly throughout the day.  Protect yourself by wearing long sleeves, pants, a wide-brimmed hat, and sunglasses whenever you are outside. HEART DISEASE, DIABETES, AND HIGH BLOOD PRESSURE   Have your blood pressure checked at least every 1-2 years. High blood pressure causes heart disease and increases the risk of stroke.  If you are between 46 years and 40 years old, ask your health care provider if you should take aspirin to prevent strokes.  Have regular diabetes screenings. This involves taking a blood sample to check your fasting blood sugar level.  If you are at a normal weight and have a low risk for diabetes, have this test once every three years after 72 years of age.  If you are overweight and have a high risk for diabetes, consider being tested at a younger age or more often. PREVENTING INFECTION  Hepatitis B  If you have a higher risk for hepatitis B, you should be screened for this virus. You are considered at high risk for hepatitis B if:  You were born in a country where hepatitis B is common. Ask your health care provider which countries are considered high risk.  Your parents were born in a high-risk country, and you have not been immunized against hepatitis B (hepatitis B vaccine).  You have HIV or AIDS.  You use needles to inject street drugs.  You live with someone who has hepatitis B.  You have had sex with someone who has hepatitis B.  You get hemodialysis treatment.  You take certain medicines for conditions, including cancer, organ transplantation, and autoimmune conditions. Hepatitis C  Blood testing is recommended for:  Everyone born from [birth year] through 1963.  Anyone with known risk factors for hepatitis  C. Sexually transmitted infections (STIs)  You should be screened for sexually transmitted infections (STIs) including gonorrhea and chlamydia if:  You are sexually active and are younger than 72 years of age.  You are older  than 72 years of age and your health care provider tells you that you are at risk for this type of infection.  Your sexual activity has changed since you were last screened and you are at an increased risk for chlamydia or gonorrhea. Ask your health care provider if you are at risk.  If you do not have HIV, but are at risk, it may be recommended that you take a prescription medicine daily to prevent HIV infection. This is called pre-exposure prophylaxis (PrEP). You are considered at risk if:  You are sexually active and do not regularly use condoms or know the HIV status of your partner(s).  You take drugs by injection.  You are sexually active with a partner who has HIV. Talk with your health care provider about whether you are at high risk of being infected with HIV. If you choose to begin PrEP, you should first be tested for HIV. You should then be tested every 3 months for as long as you are taking PrEP.  PREGNANCY   If you are premenopausal and you may become pregnant, ask your health care provider about preconception counseling.  If you may become pregnant, take 400 to 800 micrograms (mcg) of folic acid every day.  If you want to prevent pregnancy, talk to your health care provider about birth control (contraception). OSTEOPOROSIS AND MENOPAUSE   Osteoporosis is a disease in which the bones lose minerals and strength with aging. This can result in serious bone fractures. Your risk for osteoporosis can be identified using a bone density scan.  If you are 30 years of age or older, or if you are at risk for osteoporosis and fractures, ask your health care provider if you should be screened.  Ask your health care provider whether you should take a calcium or  vitamin D supplement to lower your risk for osteoporosis.  Menopause may have certain physical symptoms and risks.  Hormone replacement therapy may reduce some of these symptoms and risks. Talk to your health care provider about whether hormone replacement therapy is right for you.  HOME CARE INSTRUCTIONS   Schedule regular health, dental, and eye exams.  Stay current with your immunizations.   Do not use any tobacco products including cigarettes, chewing tobacco, or electronic cigarettes.  If you are pregnant, do not drink alcohol.  If you are breastfeeding, limit how much and how often you drink alcohol.  Limit alcohol intake to no more than 1 drink per day for nonpregnant women. One drink equals 12 ounces of beer, 5 ounces of wine, or 1 ounces of hard liquor.  Do not use street drugs.  Do not share needles.  Ask your health care provider for help if you need support or information about quitting drugs.  Tell your health care provider if you often feel depressed.  Tell your health care provider if you have ever been abused or do not feel safe at home. Document Released: 02/03/2011 Document Revised: 12/05/2013 Document Reviewed: 06/22/2013 [facility] Patient Information 2015 [place], [place]. This information is not intended to replace advice given to you by your health care provider. Make sure you discuss any questions you have with your health care provider.

## 2014-12-27 NOTE — Progress Notes (Signed)
Subjective:   Holly Simmons is a 74 y.o. female who presents for Medicare Annual (Subsequent) preventive examination.  Review of Systems:   HRA assessment completed during visit; Patient is here for Annual Wellness Assessment  The patient describes their health better, the same or worse than last year? Better If better; what has changed? Staying well  VS" bp is mod elevated;  Smoking no smoking hx ETOH: no   BMI; 32  Family Hx; of heart disease / MI/ both grand-fathers and one uncle   Labs: cholesterol is therapeutic range  Diet: Eat to many sweets Breakfast; 0 Lunch; Very light; sandwich Supper: go out to eat; don't cook very much;  [employer]; [place];  Fruits and vegetables;   Exercise; states I do to much sitting; loves cross-stitch;  Lives with spouse Children; One died 66 hours after born  Insurance coverage of exercise:   Lose weight; watched what she ate;  Motivation 1-5;  Was a 5 and now a 2;  Benefit of losing weight; depressing not to lose   Assessed patients understanding of risk and desire to improve health Discussed Goal to improve health based on risk  Spouse is disabled with back pain  Screenings; Bone density: bone density was completed in Feb 2014/ it was normal  Colonoscopy; 04/2014; removed polyp; ? repeat in 5 years Mammogram: Feb 2016 / watching a mass; will repeat mammogram and u/s  Watching a mass x 2 years. Pap: Haven't had one; EKG: completed  Vision: Had once a year  Hearing: tinnitis ongoing but better at present  Dental: Dentist   Gave information on safety to take home;   Current Care Team reviewed and patient stated everyone was in her record          Objective:     Vitals: BP 140/64 mmHg  Ht 5\' 3"  (1.6 m)  Wt 184 lb (83.462 kg)  BMI 32.60 kg/m2  Tobacco History  Smoking status  . Never Smoker   Smokeless tobacco  . Never Used     Counseling given: Not Answered   Past Medical  History  Diagnosis Date  . Hypertension   . Hyperpotassemia   . Asthma     NOS w/ acute exacerbation  . Hypersomnia   . Depressive disorder   . Diastolic dysfunction   . Pyuria   . Renal insufficiency   . GERD (gastroesophageal reflux disease)   . COPD (chronic obstructive pulmonary disease)   . Obstructive sleep apnea     persistent daytime sleepiness despite cpap  . Allergic rhinitis   . Psoriasis   . Hypertrophic cardiomyopathy     s/p surgical myomectomy at [facility] 0000000 complicated by septal VSD post procedure requiring reoperation with patch closure and tricuspid valve replacement  . 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 Complete heart block     requiring PPM (MDT) post surgical myomectomy at [facility],  leads are epicardial with abdominal implant, high ventricular threshold at implant  . Myocardial infarction 2011  . Heart murmur   . 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 Gout 08/20/2012  . Colon polyps     TUBULAR ADENOMAS AND HYPERPLASTIC  . DM (diabetes mellitus)   . Gallstones   . Kidney stones   . Blood transfusion without reported diagnosis   . Typical atrial flutter 9/15   Past Surgical History  Procedure Laterality Date  . Appendectomy    . Cholecystectomy    . Tonsillectomy    .  Cesarean section    . Abdominal hysterectomy    . Septal myomectomy for hypertrophic cm  06/23/11    by Dr Evelina Dun at [facility], complicated by septal VSD requiring patch repair and tricuspid valve replacement  . Tricuspid valve replacement  11/12    Medtronic 59mm Mosaic tissue valve  . Vsd repair  06/2011  . Pacemaker insertion  07/02/11    epicardial wires with abdominal implant at [place] 12/12,  high ventricular lead threshold at implant per Dr Westley Gambles  . Insert / replace / remove pacemaker  07/13/2012  . Colonoscopy    . Polypectomy    . Permanent pacemaker insertion N/A 07/13/2012    Procedure: PERMANENT PACEMAKER INSERTION;  Surgeon: Thompson Grayer, MD;  [facility] CATH LAB;   Service: Cardiovascular;  Laterality: N/A;  . Cardioversion N/A 08/01/2014    Procedure: CARDIOVERSION;  Surgeon: Thayer Headings, MD;  Location: [facility] ENDOSCOPY;  Service: Cardiovascular;  Laterality: N/A;   Family History  Problem Relation Age of Onset  . Bladder Cancer Father   . Prostate cancer Father   . Cancer Father   . Varicose Veins Father   . Heart attack Father   . Ovarian cancer Maternal Aunt   . Heart disease Maternal Grandfather   . Heart disease Paternal Grandfather   . Breast cancer Mother   . Dementia Mother   . Hypertension Mother   . Cancer Mother   . Pancreatic cancer Cousin   . Breast cancer Paternal Aunt   . Dementia Maternal Aunt     x 2  . Dementia Maternal Uncle     x 2  . Colon cancer Neg Hx   . Hypertension Daughter    History  Sexual Activity  . Sexual Activity: Not Currently    Outpatient Encounter Prescriptions as of 12/27/2014  Medication Sig  . allopurinol (ZYLOPRIM) 100 MG tablet TAKE ONE TABLET BY MOUTH ONE TIME DAILY   . B Complex-C (B-COMPLEX WITH VITAMIN C) tablet Take 1 tablet by mouth daily.  . budesonide-formoterol (SYMBICORT) 160-4.5 MCG/ACT inhaler Inhale 2 puffs into the lungs 2 (two) times daily.  Marland Kitchen buPROPion (WELLBUTRIN XL) 150 MG 24 hr tablet TAKE ONE TABLET BY MOUTH ONE TIME DAILY   . fexofenadine (ALLEGRA) 180 MG tablet Take 180 mg by mouth daily as needed for allergies.   . furosemide (LASIX) 40 MG tablet TAKE ONE TABLET BY MOUTH EVERY OTHER DAY   . hydrocortisone (ANUSOL-HC) 25 MG suppository Place 25 mg rectally daily as needed for hemorrhoids.  . LUXIQ 0.12 % foam APPLY TO SCALP AS DIRECTED  . metoprolol succinate (TOPROL-XL) 100 MG 24 hr tablet TAKE ONE TABLET BY MOUTH ONE TIME DAILY   . Multiple Vitamin (MULTIVITAMIN) tablet Take 1 tablet by mouth daily.  Marland Kitchen NUVIGIL 150 MG tablet TAKE ONE TABLET BY MOUTH DAILY AS NEEDED  . ranitidine (ZANTAC) 300 MG tablet TAKE ONE TABLET BY MOUTH AT BEDTIME   . spironolactone (ALDACTONE)  25 MG tablet Take 12.5 mg by mouth every other day.   . spironolactone (ALDACTONE) 25 MG tablet TAKE ONE-HALF TABLET (12.5 MG TOTAL) BY MOUTH ONE TIME DAILY (Patient not taking: Reported on 11/15/2014)  . tobramycin-dexamethasone (TOBRADEX) ophthalmic solution Place 2 drops into both eyes every 6 (six) hours. (Patient not taking: Reported on 11/15/2014)  . TRADJENTA 5 MG TABS tablet TAKE ONE TABLET BY MOUTH ONE TIME DAILY   . triamcinolone cream (KENALOG) 0.5 % Apply 1 application topically 3 (three) times daily as needed (itching).   Marland Kitchen  Vilazodone HCl (VIIBRYD) 40 MG TABS Take 1 tablet (40 mg total) by mouth daily.  Marland Kitchen warfarin (COUMADIN) 2.5 MG tablet TAKE ONE TO ONE  AND ONE-HALF TABLETS BY MOUTH DAILY as directed by coumadin clinic   No facility-administered encounter medications on file as of 12/27/2014.    Activities of Daily Living No flowsheet data found.  Patient Care Team: Janith Lima, MD as PCP - General    Assessment:   The patient admitted that depression is an issue, but Dr. Ronnald Ramp is treating. Openly discussed the 2 heart surgeries and reaction to 7th unit of blood and admits that she had not really "gotten over this emotionally". She discusses many losses in her life; including caring for mother with dementia. States everything "happened at once"   Validated the patient's feelings and grief process as well as lingering feelings related to her hospitalization.  Discussed Counseling if she feels that may be helpful to assist her to process what was a very traumatic event for her.  Has brochure for [place].  Set a goal of losing weight; but noted that it takes time to deal with feelings and once resolved the weight loss and other goals will be much less difficult.  Did not complete Depression scale because she is currently under treatment and had information on counseling if she feels this would be helpful  Overall, states she see her doctor's every 3  months and is up to date on her screens.   Exercise Activities and Dietary recommendations    Goals    . Record weight daily     Call back on snacks; 3 or 4 times (cookies, ice cream, yogurt)  Snacks; HFCS (high fructose corn syrup)  Low sodium; warm lemon water in morning;       Fall Risk Fall Risk  12/27/2014 05/26/2013 01/06/2013  Falls in the past year? No Yes No  Number falls in past yr: - 1 -  Injury with Fall? - Yes -  Risk Factor Category  - High Fall Risk -  Risk for fall due to : - Impaired balance/gait -   Depression Screen PHQ 2/9 Scores 12/27/2014 05/26/2013 01/06/2013  PHQ - 2 Score 4 4 0  PHQ- 9 Score - 11 -     Cognitive Testing No flowsheet data found.  AD8 completed and score is 0  Immunization History  Administered Date(s) Administered  . Influenza Split 05/14/2011, 05/12/2012  . Influenza Whole 05/02/2009, 04/24/2010  . Influenza,inj,Quad PF,36+ Mos 04/13/2013, 04/19/2014  . Pneumococcal Conjugate-13 01/18/2014  . Pneumococcal Polysaccharide-23 08/10/2008  . Varicella 03/11/2006   Screening Tests Health Maintenance  Topic Date Due  . TETANUS/TDAP  07/19/1962  . ZOSTAVAX  07/20/2003  . INFLUENZA VACCINE  03/05/2015  . HEMOGLOBIN A1C  03/28/2015  . URINE MICROALBUMIN  05/25/2015  . OPHTHALMOLOGY EXAM  08/10/2015  . FOOT EXAM  09/28/2015  . MAMMOGRAM  09/19/2016  . COLONOSCOPY  05/08/2019  . DEXA SCAN  Completed  . PNA vac Low Risk Adult  Completed      Plan:    During the course of the visit the patient was educated and counseled about the following appropriate screening and preventive services:   Vaccines to include Pneumoccal, Influenza, Hepatitis B, Td, Zostavax, HCV  The patient had her shingles by prior doctor in  [place], office is now closed and she is not sure of the date  Also; allergic to tetanus.  Electrocardiogram/ completed  Cardiovascular Disease/ followed appropriately  by cardiology;   Colorectal cancer screening/  not due and states she is not sure she will have another  Bone density screening; 09/2012; not interested in repeating at this time  Diabetes screening; na  Glaucoma screening / completed  Mammography/ Watching issue with breast but is no cancer and doctor's are following but so far, everything is good  Nutrition counseling   Patient Instructions (the written plan) was given to the patient.    Wynetta Fines, RN  12/27/2014

## 2015-01-03 ENCOUNTER — Other Ambulatory Visit: Payer: Self-pay

## 2015-01-03 ENCOUNTER — Ambulatory Visit (INDEPENDENT_AMBULATORY_CARE_PROVIDER_SITE_OTHER): Payer: Medicare PPO | Admitting: *Deleted

## 2015-01-03 DIAGNOSIS — Z5181 Encounter for therapeutic drug level monitoring: Secondary | ICD-10-CM

## 2015-01-03 DIAGNOSIS — I483 Typical atrial flutter: Secondary | ICD-10-CM

## 2015-01-03 DIAGNOSIS — I4892 Unspecified atrial flutter: Secondary | ICD-10-CM

## 2015-01-03 LAB — POCT INR: INR: 2.7

## 2015-01-03 MED ORDER — BUPROPION HCL ER (XL) 150 MG PO TB24: 150.0000 mg | ORAL_TABLET | Freq: Every day | ORAL | Status: DC

## 2015-01-03 MED ORDER — RANITIDINE HCL 300 MG PO TABS
300.0000 mg | ORAL_TABLET | Freq: Every day | ORAL | Status: DC
Start: 2015-01-03 — End: 2015-01-04

## 2015-01-04 ENCOUNTER — Other Ambulatory Visit: Payer: Self-pay

## 2015-01-04 MED ORDER — RANITIDINE HCL 300 MG PO TABS: 300.0000 mg | ORAL_TABLET | Freq: Every day | ORAL | Status: DC

## 2015-01-04 MED ORDER — SPIRONOLACTONE 25 MG PO TABS: ORAL_TABLET | ORAL | Status: DC

## 2015-01-24 ENCOUNTER — Ambulatory Visit (INDEPENDENT_AMBULATORY_CARE_PROVIDER_SITE_OTHER)
Admission: RE | Admit: 2015-01-24 | Discharge: 2015-01-24 | Disposition: A | Discharge status: Home / Self Care | Payer: Medicare PPO | Source: Ambulatory Visit | Attending: Internal Medicine | Admitting: Internal Medicine

## 2015-01-24 ENCOUNTER — Encounter: Payer: Self-pay | Admitting: Internal Medicine

## 2015-01-24 ENCOUNTER — Ambulatory Visit (INDEPENDENT_AMBULATORY_CARE_PROVIDER_SITE_OTHER): Payer: Medicare PPO | Admitting: Internal Medicine

## 2015-01-24 VITALS — BP 126/68 | HR 90 | Temp 98.0°F | Resp 16 | Ht 63.0 in | Wt 178.0 lb

## 2015-01-24 DIAGNOSIS — M1000 Idiopathic gout, unspecified site: Secondary | ICD-10-CM

## 2015-01-24 DIAGNOSIS — M79671 Pain in right foot: Secondary | ICD-10-CM | POA: Diagnosis not present

## 2015-01-24 DIAGNOSIS — S92901A Unspecified fracture of right foot, initial encounter for closed fracture: Secondary | ICD-10-CM | POA: Diagnosis not present

## 2015-01-24 DIAGNOSIS — S92909A Unspecified fracture of unspecified foot, initial encounter for closed fracture: Secondary | ICD-10-CM | POA: Insufficient documentation

## 2015-01-24 DIAGNOSIS — L309 Dermatitis, unspecified: Secondary | ICD-10-CM

## 2015-01-24 DIAGNOSIS — S92351A Displaced fracture of fifth metatarsal bone, right foot, initial encounter for closed fracture: Secondary | ICD-10-CM | POA: Diagnosis not present

## 2015-01-24 MED ORDER — METHYLPREDNISOLONE 4 MG PO TBPK: ORAL_TABLET | ORAL | Status: DC

## 2015-01-24 MED ORDER — BETAMETHASONE VALERATE 0.12 % EX FOAM: CUTANEOUS | Status: DC

## 2015-01-24 NOTE — Progress Notes (Signed)
Pre visit review using our clinic review tool, if applicable. No additional management support is needed unless otherwise documented below in the visit note. 

## 2015-01-24 NOTE — Patient Instructions (Signed)

## 2015-01-24 NOTE — Progress Notes (Signed)
Subjective:  Patient ID: Holly Simmons, female    DOB: [birth year]  Age: 74 y.o. MRN: [id number]  CC: Foot Pain   HPI SACOYA LICHTI presents for right foot pain for 3 weeks - she thinks it is gout, she does not recall an injury. She has not taken anything for pain.  Outpatient Prescriptions Prior to Visit  Medication Sig Dispense Refill  . allopurinol (ZYLOPRIM) 100 MG tablet TAKE ONE TABLET BY MOUTH ONE TIME DAILY  90 tablet 2  . B Complex-C (B-COMPLEX WITH VITAMIN C) tablet Take 1 tablet by mouth daily.    . budesonide-formoterol (SYMBICORT) 160-4.5 MCG/ACT inhaler Inhale 2 puffs into the lungs 2 (two) times daily. 1 Inhaler 11  . buPROPion (WELLBUTRIN XL) 150 MG 24 hr tablet Take 1 tablet (150 mg total) by mouth daily. 90 tablet 2  . fexofenadine (ALLEGRA) 180 MG tablet Take 180 mg by mouth daily as needed for allergies.     . furosemide (LASIX) 40 MG tablet TAKE ONE TABLET BY MOUTH EVERY OTHER DAY  45 tablet 2  . hydrocortisone (ANUSOL-HC) 25 MG suppository Place 25 mg rectally daily as needed for hemorrhoids.    . metoprolol succinate (TOPROL-XL) 100 MG 24 hr tablet TAKE ONE TABLET BY MOUTH ONE TIME DAILY  90 tablet 2  . Multiple Vitamin (MULTIVITAMIN) tablet Take 1 tablet by mouth daily.    Marland Kitchen NUVIGIL 150 MG tablet TAKE ONE TABLET BY MOUTH DAILY AS NEEDED 30 tablet 2  . ranitidine (ZANTAC) 300 MG tablet Take 1 tablet (300 mg total) by mouth at bedtime. 90 tablet 3  . spironolactone (ALDACTONE) 25 MG tablet Take 12.5 mg by mouth every other day.     . spironolactone (ALDACTONE) 25 MG tablet TAKE ONE-HALF TABLET (12.5 MG TOTAL) BY MOUTH ONE TIME DAILY 45 tablet 3  . TRADJENTA 5 MG TABS tablet TAKE ONE TABLET BY MOUTH ONE TIME DAILY  90 tablet 2  . triamcinolone cream (KENALOG) 0.5 % Apply 1 application topically 3 (three) times daily as needed (itching).     . Vilazodone HCl (VIIBRYD) 40 MG TABS Take 1 tablet (40 mg total) by mouth daily. 90 tablet 3  . warfarin  (COUMADIN) 2.5 MG tablet TAKE ONE TO ONE  AND ONE-HALF TABLETS BY MOUTH DAILY as directed by coumadin clinic 45 tablet 3  . LUXIQ 0.12 % foam APPLY TO SCALP AS DIRECTED 100 g 11  . tobramycin-dexamethasone (TOBRADEX) ophthalmic solution Place 2 drops into both eyes every 6 (six) hours. 5 mL 0   No facility-administered medications prior to visit.    ROS Review of Systems  Objective:  BP 126/68 mmHg  Pulse 90  Temp(Src) 98 F (36.7 C) (Oral)  Resp 16  Ht 5\' 3"  (1.6 m)  Wt 178 lb (80.74 kg)  BMI 31.54 kg/m2  SpO2 95%  BP Readings from Last 3 Encounters:  01/24/15 126/68  12/27/14 140/64  11/15/14 124/59    Wt Readings from Last 3 Encounters:  01/24/15 178 lb (80.74 kg)  12/27/14 184 lb (83.462 kg)  11/15/14 182 lb (82.555 kg)    Physical Exam  Constitutional: She is oriented to person, place, and time.  HENT:  Mouth/Throat: Oropharynx is clear and moist. No oropharyngeal exudate.  Eyes: Conjunctivae are normal. Right eye exhibits no discharge. Left eye exhibits no discharge. No scleral icterus.  Neck: Neck supple. No thyromegaly present.  Cardiovascular: Normal rate, regular rhythm, S1 normal, S2 normal and intact distal pulses.  Exam reveals  no gallop, no S3, no S4 and no friction rub.   Murmur heard.  Decrescendo systolic murmur is present with a grade of 1/6   No diastolic murmur is present  Pulmonary/Chest: Effort normal and breath sounds normal. No respiratory distress. She has no wheezes. She has no rales. She exhibits no tenderness.  Abdominal: Soft. Bowel sounds are normal. She exhibits no distension and no mass. There is no tenderness. There is no rebound and no guarding.  Musculoskeletal: Normal range of motion. She exhibits no edema or tenderness.       Right foot: There is bony tenderness, swelling and deformity. There is normal range of motion, normal capillary refill, no crepitus and no laceration.       Feet:  Neurological: She is oriented to person,  place, and time.  Skin: Skin is warm and dry. No rash noted. No erythema. No pallor.  Vitals reviewed.   Lab Results  Component Value Date   WBC 7.7 07/26/2014   HGB 12.4 07/26/2014   HCT 38.4 07/26/2014   PLT 167.0 07/26/2014   GLUCOSE 106* 09/27/2014   CHOL 135 05/24/2014   TRIG 80.0 05/24/2014   HDL 49.80 05/24/2014   LDLCALC 69 05/24/2014   ALT 17 09/27/2014   AST 24 09/27/2014   NA 136 09/27/2014   K 4.0 09/27/2014   CL 96 09/27/2014   CREATININE 1.19 09/27/2014   BUN 17 09/27/2014   CO2 32 09/27/2014   TSH 2.72 09/27/2014   INR 2.7 01/03/2015   HGBA1C 6.5 09/27/2014   MICROALBUR 32.8* 05/24/2014    No results found.  Assessment & Plan:   Grettell was seen today for foot pain.  Diagnoses and all orders for this visit:  Eczema Orders: -     Betamethasone Valerate (LUXIQ) 0.12 % foam; APPLY TO SCALP AS DIRECTED  Right foot pain - exam and film are c/w fx of 5th metatarsal, she does not want anything for pain Orders: -     DG Foot Complete Right; Future -     Discontinue: methylPREDNISolone (MEDROL DOSEPAK) 4 MG TBPK tablet; TAKE AS DIRECTED  Idiopathic gout, unspecified chronicity, unspecified site - initially thought this may be gout but it appears to be a fracture, I have asked her not to start the steroids Orders: -     Discontinue: methylPREDNISolone (MEDROL DOSEPAK) 4 MG TBPK tablet; TAKE AS DIRECTED  Foot fracture, right, closed, initial encounter - xray is positive for fracture, will send an urgent ortho referral Orders: -     Ambulatory referral to Orthopedic Surgery   I have discontinued Ms. Simpson's tobramycin-dexamethasone and methylPREDNISolone. I have also changed her LUXIQ to Betamethasone Valerate. Additionally, I am having her maintain her fexofenadine, multivitamin, B-complex with vitamin C, Vilazodone HCl, hydrocortisone, spironolactone, triamcinolone cream, budesonide-formoterol, TRADJENTA, allopurinol, furosemide, metoprolol succinate,  warfarin, NUVIGIL, buPROPion, ranitidine, and spironolactone.  Meds ordered this encounter  Medications  . Betamethasone Valerate (LUXIQ) 0.12 % foam    Sig: APPLY TO SCALP AS DIRECTED    Dispense:  100 g    Refill:  11  . DISCONTD: methylPREDNISolone (MEDROL DOSEPAK) 4 MG TBPK tablet    Sig: TAKE AS DIRECTED    Dispense:  21 tablet    Refill:  0     Follow-up: Return in about 3 weeks (around 02/14/2015).  Scarlette Calico, MD

## 2015-01-26 ENCOUNTER — Encounter: Payer: Self-pay | Admitting: Internal Medicine

## 2015-01-30 DIAGNOSIS — S92351A Displaced fracture of fifth metatarsal bone, right foot, initial encounter for closed fracture: Secondary | ICD-10-CM | POA: Diagnosis not present

## 2015-01-31 ENCOUNTER — Other Ambulatory Visit: Payer: Self-pay | Admitting: Internal Medicine

## 2015-01-31 DIAGNOSIS — I422 Other hypertrophic cardiomyopathy: Secondary | ICD-10-CM

## 2015-01-31 DIAGNOSIS — F418 Other specified anxiety disorders: Secondary | ICD-10-CM

## 2015-01-31 DIAGNOSIS — IMO0001 Reserved for inherently not codable concepts without codable children: Secondary | ICD-10-CM

## 2015-01-31 DIAGNOSIS — I5032 Chronic diastolic (congestive) heart failure: Secondary | ICD-10-CM

## 2015-01-31 DIAGNOSIS — I110 Hypertensive heart disease with heart failure: Secondary | ICD-10-CM

## 2015-01-31 MED ORDER — BUDESONIDE-FORMOTEROL FUMARATE 80-4.5 MCG/ACT IN AERO: 2.0000 | INHALATION_SPRAY | Freq: Two times a day (BID) | RESPIRATORY_TRACT | Status: DC

## 2015-01-31 MED ORDER — METOPROLOL SUCCINATE ER 100 MG PO TB24: 100.0000 mg | ORAL_TABLET | Freq: Every day | ORAL | Status: DC

## 2015-01-31 MED ORDER — SPIRONOLACTONE 25 MG PO TABS: 25.0000 mg | ORAL_TABLET | ORAL | Status: DC

## 2015-01-31 MED ORDER — FUROSEMIDE 40 MG PO TABS: 40.0000 mg | ORAL_TABLET | ORAL | Status: DC

## 2015-01-31 MED ORDER — VILAZODONE HCL 40 MG PO TABS: 40.0000 mg | ORAL_TABLET | Freq: Every day | ORAL | Status: DC

## 2015-01-31 MED ORDER — AMPICILLIN 500 MG PO CAPS: 500.0000 mg | ORAL_CAPSULE | ORAL | Status: DC

## 2015-02-14 ENCOUNTER — Other Ambulatory Visit: Payer: Self-pay | Admitting: Internal Medicine

## 2015-02-14 ENCOUNTER — Ambulatory Visit (INDEPENDENT_AMBULATORY_CARE_PROVIDER_SITE_OTHER): Payer: Medicare PPO | Admitting: *Deleted

## 2015-02-14 DIAGNOSIS — I483 Typical atrial flutter: Secondary | ICD-10-CM | POA: Diagnosis not present

## 2015-02-14 DIAGNOSIS — S92351D Displaced fracture of fifth metatarsal bone, right foot, subsequent encounter for fracture with routine healing: Secondary | ICD-10-CM | POA: Diagnosis not present

## 2015-02-14 DIAGNOSIS — Z5181 Encounter for therapeutic drug level monitoring: Secondary | ICD-10-CM | POA: Diagnosis not present

## 2015-02-14 DIAGNOSIS — I4892 Unspecified atrial flutter: Secondary | ICD-10-CM

## 2015-02-14 DIAGNOSIS — N6002 Solitary cyst of left breast: Secondary | ICD-10-CM

## 2015-02-14 LAB — POCT INR: INR: 2.6

## 2015-03-14 ENCOUNTER — Ambulatory Visit (INDEPENDENT_AMBULATORY_CARE_PROVIDER_SITE_OTHER): Payer: Medicare PPO | Admitting: *Deleted

## 2015-03-14 DIAGNOSIS — I495 Sick sinus syndrome: Secondary | ICD-10-CM | POA: Diagnosis not present

## 2015-03-14 NOTE — Progress Notes (Signed)
Remote pacemaker transmission.   

## 2015-03-21 ENCOUNTER — Ambulatory Visit
Admission: RE | Admit: 2015-03-21 | Discharge: 2015-03-21 | Disposition: A | Discharge status: Home / Self Care | Payer: Medicare PPO | Source: Ambulatory Visit | Attending: Internal Medicine | Admitting: Internal Medicine

## 2015-03-21 DIAGNOSIS — N63 Unspecified lump in breast: Secondary | ICD-10-CM | POA: Diagnosis not present

## 2015-03-21 DIAGNOSIS — N6002 Solitary cyst of left breast: Secondary | ICD-10-CM

## 2015-03-21 LAB — CUP PACEART REMOTE DEVICE CHECK
Battery Impedance: 185 Ohm
Battery Voltage: 2.79 V
Brady Statistic AP VP Percent: 100 %
Brady Statistic AP VS Percent: 0 %
Brady Statistic AS VP Percent: 0 %
Date Time Interrogation Session: 20160810120708
Lead Channel Impedance Value: 480 Ohm
Lead Channel Pacing Threshold Amplitude: 0.875 V
Lead Channel Pacing Threshold Pulse Width: 0.4 ms
Lead Channel Pacing Threshold Pulse Width: 0.4 ms
Lead Channel Setting Pacing Amplitude: 2 V
Lead Channel Setting Pacing Amplitude: 2.5 V
Lead Channel Setting Pacing Pulse Width: 0.4 ms
Lead Channel Setting Sensing Sensitivity: 8 mV
MDC IDC MSMT BATTERY REMAINING LONGEVITY: 111 mo
MDC IDC MSMT LEADCHNL RV IMPEDANCE VALUE: 854 Ohm
MDC IDC MSMT LEADCHNL RV PACING THRESHOLD AMPLITUDE: 0.75 V
MDC IDC STAT BRADY AS VS PERCENT: 0 %

## 2015-03-21 LAB — HM MAMMOGRAPHY

## 2015-03-27 ENCOUNTER — Encounter: Payer: Self-pay | Admitting: Cardiology

## 2015-03-27 NOTE — Progress Notes (Signed)
Patient ID: Holly Simmons, female   DOB: 1941/07/06, 74 y.o.   MRN: [id number]   She has a fairly complex history. She has a history of HOCM and underwent surgery at [facility] recently. She was seen in followup by Dr. Rayann Heman 11/15/11 . She underwent myomectomy at [facility] 06/23/11. 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 She underwent cardioversion for atrial flutter and was placed on Coumadin and amiodarone. Epicardial leads are known to have a high threshold.  Echocardiogram 08/27/11: Status post septal myomectomy complicated by VSD now with VSD patch repair-patch covers the basal septal wall and leads to akinesis of the wall within that area, small to moderate residual VSD at the apical end of the patch, EF 50%, moderate diastolic dysfunction, no LVOT or SAM, trivial MR, TR with mildly elevated mean gradient.  Maintaining sinus rhythm and her amiodarone and Coumadin were both discontinued.  Subsequently had new pacer placed 1/15 . Could not cross TVR for stable lead but got good threshholds in CS.   Reviewed echo from1/15  and VSD has closed with overall good EF and normal funcitoning TVR No SAM  12/15 found to be in flutter on pacer interrogation  [facility] with DR Nahser  12/29  Coumadin Rx  CHADVASC 3   Reviewed echo from today and EF ok no LVOT gradient No residual VSD  Gradients across TV a bit high    ROS: Denies fever, malais, weight loss, blurry vision, decreased visual acuity, cough, sputum, SOB, hemoptysis, pleuritic pain, palpitaitons, heartburn, abdominal pain, melena, lower extremity edema, claudication, or rash.  All other systems reviewed and negative  General: Affect appropriate Healthy:  appears stated age 74: normal Neck supple with no adenopathy JVP normal no bruits no thyromegaly Lungs clear with no  wheezing and good diaphragmatic motion Heart:  S1/S2 no murmur, no rub, gallop or click PMI normal Abdomen: benighn, BS positve, no tenderness, no AAA  Epicardial pacer LLQ no bruit.  No HSM or HJR Distal pulses intact with no bruits Plus one  Edema  Lot of varicose veins bilaterally  Neuro non-focal Skin warm and dry No muscular weakness   Current Outpatient Prescriptions  Medication Sig Dispense Refill  . allopurinol (ZYLOPRIM) 100 MG tablet TAKE ONE TABLET BY MOUTH ONE TIME DAILY  90 tablet 2  . ampicillin (PRINCIPEN) 500 MG capsule Take 1 capsule (500 mg total) by mouth as directed. 30 capsule 2  . B Complex-C (B-COMPLEX WITH VITAMIN C) tablet Take 1 tablet by mouth daily.    . Betamethasone Valerate (LUXIQ) 0.12 % foam APPLY TO SCALP AS DIRECTED 100 g 11  . budesonide-formoterol (SYMBICORT) 80-4.5 MCG/ACT inhaler Inhale 2 puffs into the lungs 2 (two) times daily. 1 Inhaler 11  . buPROPion (WELLBUTRIN XL) 150 MG 24 hr tablet Take 1 tablet (150 mg total) by mouth daily. 90 tablet 2  . fexofenadine (ALLEGRA) 180 MG tablet Take 180 mg by mouth daily as needed for allergies.     . furosemide (LASIX) 40 MG tablet Take 1 tablet (40 mg total) by mouth every other day. 45 tablet 2  . hydrocortisone (ANUSOL-HC) 25 MG suppository Place 25 mg rectally daily as needed for hemorrhoids.    . metoprolol succinate (TOPROL-XL) 100 MG 24 hr tablet Take 1 tablet (100 mg total) by mouth daily. Take with or immediately  following a meal. 90 tablet 3  . Multiple Vitamin (MULTIVITAMIN) tablet Take 1 tablet by mouth daily.    Marland Kitchen NUVIGIL 150 MG tablet TAKE ONE TABLET BY MOUTH DAILY AS NEEDED 30 tablet 2  . ranitidine (ZANTAC) 300 MG tablet Take 1 tablet (300 mg total) by mouth at bedtime. 90 tablet 3  . spironolactone (ALDACTONE) 25 MG tablet Take 1 tablet (25 mg total) by mouth every other day. 45 tablet 1  . TRADJENTA 5 MG TABS tablet TAKE ONE TABLET BY MOUTH ONE TIME DAILY  90 tablet 2  . triamcinolone  cream (KENALOG) 0.5 % Apply 1 application topically 3 (three) times daily as needed (itching).     . Vilazodone HCl (VIIBRYD) 40 MG TABS Take 1 tablet (40 mg total) by mouth daily. 30 tablet 11  . warfarin (COUMADIN) 2.5 MG tablet TAKE ONE TO ONE  AND ONE-HALF TABLETS BY MOUTH DAILY as directed by coumadin clinic 45 tablet 3  . [DISCONTINUED] Alum & Mag Hydroxide-Simeth (MAGIC MOUTHWASH) SOLN Take 5 mLs by mouth 3 (three) times daily. 14 days then stop      No current facility-administered medications for this visit.    Allergies  Sulfonamide derivatives; Tetanus toxoids; Other; and Nsaids  Electrocardiogram:  AV paced rate 79  8/15 12/23  Flutter rate 85  RBBB paced    Assessment and Plan HOCM: post myectomy with complication but most of LVOT gradient gone now VSD: no murmur last echo sealed post MI/myectomy complication Pacer  Thresholds ok f/u [facility] 6 months TV replacement  F/u echo in a year no diastolic murmur Anticoagulation  For PAF  No bleeding issues f/u coumadin clinic

## 2015-03-28 ENCOUNTER — Ambulatory Visit (INDEPENDENT_AMBULATORY_CARE_PROVIDER_SITE_OTHER): Payer: Medicare PPO | Admitting: Pharmacist

## 2015-03-28 ENCOUNTER — Encounter: Payer: Self-pay | Admitting: Cardiovascular Disease

## 2015-03-28 ENCOUNTER — Ambulatory Visit (INDEPENDENT_AMBULATORY_CARE_PROVIDER_SITE_OTHER): Payer: Medicare PPO | Admitting: Cardiovascular Disease

## 2015-03-28 VITALS — BP 110/50 | HR 67 | Ht 63.0 in | Wt 182.4 lb

## 2015-03-28 DIAGNOSIS — I119 Hypertensive heart disease without heart failure: Secondary | ICD-10-CM

## 2015-03-28 DIAGNOSIS — I483 Typical atrial flutter: Secondary | ICD-10-CM

## 2015-03-28 DIAGNOSIS — Z5181 Encounter for therapeutic drug level monitoring: Secondary | ICD-10-CM

## 2015-03-28 DIAGNOSIS — I4892 Unspecified atrial flutter: Secondary | ICD-10-CM

## 2015-03-28 LAB — POCT INR: INR: 3.7

## 2015-03-28 NOTE — Patient Instructions (Signed)
Medication Instructions:  NO CHANGES    Labwork: NONE  Testing/Procedures: Your physician has requested that you have an echocardiogram. Echocardiography is a painless test that uses sound waves to create images of your heart. It provides your doctor with information about the size and shape of your heart and how well your heart's chambers and valves are working. This procedure takes approximately one hour. There are no restrictions for this procedure. IN YEAR Follow-Up: Your physician wants you to follow-up in: [place]   [facility] will receive a reminder letter in the mail two months in advance. If you don't receive a letter, please call our office to schedule the follow-up appointment.  Any Other Special Instructions Will Be Listed Below (If Applicable).

## 2015-03-29 ENCOUNTER — Encounter: Payer: Self-pay | Admitting: Internal Medicine

## 2015-04-04 ENCOUNTER — Other Ambulatory Visit: Payer: Self-pay | Admitting: Internal Medicine

## 2015-04-05 ENCOUNTER — Other Ambulatory Visit: Payer: Self-pay

## 2015-04-05 DIAGNOSIS — F418 Other specified anxiety disorders: Secondary | ICD-10-CM

## 2015-04-05 MED ORDER — VILAZODONE HCL 40 MG PO TABS: 40.0000 mg | ORAL_TABLET | Freq: Every day | ORAL | Status: DC

## 2015-04-25 ENCOUNTER — Ambulatory Visit (INDEPENDENT_AMBULATORY_CARE_PROVIDER_SITE_OTHER): Payer: Medicare PPO | Admitting: Pharmacist

## 2015-04-25 DIAGNOSIS — I4892 Unspecified atrial flutter: Secondary | ICD-10-CM | POA: Diagnosis not present

## 2015-04-25 DIAGNOSIS — I483 Typical atrial flutter: Secondary | ICD-10-CM | POA: Diagnosis not present

## 2015-04-25 DIAGNOSIS — Z5181 Encounter for therapeutic drug level monitoring: Secondary | ICD-10-CM

## 2015-04-25 LAB — POCT INR: INR: 2.8

## 2015-05-02 ENCOUNTER — Ambulatory Visit (INDEPENDENT_AMBULATORY_CARE_PROVIDER_SITE_OTHER): Payer: Medicare PPO | Admitting: Pulmonary Disease

## 2015-05-02 ENCOUNTER — Encounter: Payer: Self-pay | Admitting: Pulmonary Disease

## 2015-05-02 VITALS — BP 122/62 | HR 63 | Temp 98.0°F | Ht 63.0 in | Wt 181.6 lb

## 2015-05-02 DIAGNOSIS — G4733 Obstructive sleep apnea (adult) (pediatric): Secondary | ICD-10-CM | POA: Diagnosis not present

## 2015-05-02 DIAGNOSIS — Z23 Encounter for immunization: Secondary | ICD-10-CM

## 2015-05-02 MED ORDER — ARMODAFINIL 150 MG PO TABS: 150.0000 mg | ORAL_TABLET | Freq: Every day | ORAL | Status: DC | PRN

## 2015-05-02 NOTE — Progress Notes (Signed)
Chief Complaint  Patient presents with  . Follow-up    Former pt of [place] - Wears cpap 5-6 hrs /night - Getting extra mist in the mask - Requests to get flu shot today - Requets new rx for Nuvigil printed    History of Present Illness: Holly Simmons is a 74 y.o. female with OSA.  She was previously followed by Dr. Gwenette Simmons.  She hates using CPAP.  She struggles with the mask.  She does not feel like it helps at all.  Her husband says he doesn't notice any difference in her sleep pattern w/o CPAP.  Her sleep study from 2008 showed very mild sleep apnea.   TESTS: PSG 06/05/07 >> AHI 6, SpO2 low 77% Echo 09/13/14 >> EF 55 to 60% Auto CPAP 02/02/15 to 05/01/15 >> used on 50 of 89 nights with average 6 hrs and 43 min.  Average AHI is 2.7 with median CPAP 7 cm H2O and 95 th percentile CPAP 10 cm H20.   PMhx >> HTN, Diastolic CHF, s/p Tricuspid valve replacement, CHB s/p PM, A flutter, Depression, CKD, GERD, Psoriasis, Gout, Asthma  Past surgical hx, Medications, Allergies, Family hx, Social hx all reviewed.   Physical Exam: BP 122/62 mmHg  Pulse 63  Temp(Src) 98 F (36.7 C) (Oral)  Ht 5\' 3"  (1.6 m)  Wt 181 lb 9.6 oz (82.373 kg)  BMI 32.18 kg/m2  SpO2 96%  General - No distress ENT - No sinus tenderness, no oral exudate, no LAN Cardiac - s1s2 regular, no murmur Chest - No wheeze/rales/dullness Back - No focal tenderness Abd - Soft, non-tender Ext - No edema Neuro - Normal strength Skin - No rashes Psych - normal mood, and behavior   Assessment/Plan:  Obstructive sleep apnea. She is not sure that CPAP is helping, and in fact is making her sleep worse. Plan: - will arrange for home sleep study to assess status of sleep apnea, and then determine if she can discontinue CPAP set up  Intermittent daytime hypersomnolence. This is only an issue while driving. Plan: - will refill her nuvigil >> she confirmed that she had nuvigil use okayed by her cardiologist  previously   Holly Mires, MD [place] Pager:  [phone]

## 2015-05-02 NOTE — Patient Instructions (Signed)
Flu shot today  Will arrange for home sleep study Will call to arrange for follow up after sleep study reviewed

## 2015-05-15 DIAGNOSIS — G4733 Obstructive sleep apnea (adult) (pediatric): Secondary | ICD-10-CM | POA: Diagnosis not present

## 2015-05-16 ENCOUNTER — Ambulatory Visit (INDEPENDENT_AMBULATORY_CARE_PROVIDER_SITE_OTHER): Payer: Medicare PPO | Admitting: *Deleted

## 2015-05-16 DIAGNOSIS — Z5181 Encounter for therapeutic drug level monitoring: Secondary | ICD-10-CM | POA: Diagnosis not present

## 2015-05-16 DIAGNOSIS — I483 Typical atrial flutter: Secondary | ICD-10-CM | POA: Diagnosis not present

## 2015-05-16 DIAGNOSIS — I4892 Unspecified atrial flutter: Secondary | ICD-10-CM

## 2015-05-16 LAB — POCT INR: INR: 3

## 2015-05-17 ENCOUNTER — Other Ambulatory Visit: Payer: Self-pay | Admitting: *Deleted

## 2015-05-17 ENCOUNTER — Telehealth: Payer: Self-pay | Admitting: Pulmonary Disease

## 2015-05-17 DIAGNOSIS — G4733 Obstructive sleep apnea (adult) (pediatric): Secondary | ICD-10-CM

## 2015-05-17 NOTE — Telephone Encounter (Signed)
Called and spoke with pt. Reviewed results and recs. Pt voiced understanding and had no further questions. I scheduled ov for 10.18.16 @ 2pm with TP. Pt agreed to ov. Nothing further needed, will sign off on message.

## 2015-05-17 NOTE — Telephone Encounter (Signed)
Pt returning call.Holly Simmons ° °

## 2015-05-17 NOTE — Telephone Encounter (Signed)
lmtcb x1 for pt. 

## 2015-05-17 NOTE — Telephone Encounter (Signed)
HST 05/15/15 >> AHI 29, SaO2 low 70%  Will have my nurse inform pt that sleep study shows moderate to severe sleep apnea.  Please schedule ROV with me or Tammy Parrett to review tx options >> restarting CPAP versus oral appliance.

## 2015-05-21 ENCOUNTER — Other Ambulatory Visit: Payer: Self-pay

## 2015-05-21 DIAGNOSIS — I110 Hypertensive heart disease with heart failure: Secondary | ICD-10-CM

## 2015-05-21 DIAGNOSIS — I422 Other hypertrophic cardiomyopathy: Secondary | ICD-10-CM

## 2015-05-21 DIAGNOSIS — I5032 Chronic diastolic (congestive) heart failure: Secondary | ICD-10-CM

## 2015-05-21 MED ORDER — SPIRONOLACTONE 25 MG PO TABS: 25.0000 mg | ORAL_TABLET | ORAL | Status: DC

## 2015-05-22 ENCOUNTER — Encounter: Payer: Self-pay | Admitting: Adult Health

## 2015-05-22 ENCOUNTER — Ambulatory Visit (INDEPENDENT_AMBULATORY_CARE_PROVIDER_SITE_OTHER): Payer: Medicare PPO | Admitting: Adult Health

## 2015-05-22 VITALS — BP 138/72 | HR 89 | Temp 98.3°F | Ht 63.0 in | Wt 182.4 lb

## 2015-05-22 DIAGNOSIS — G4733 Obstructive sleep apnea (adult) (pediatric): Secondary | ICD-10-CM | POA: Diagnosis not present

## 2015-05-22 NOTE — Patient Instructions (Signed)
We are going to order you a new CPAP machine.  Continue with same CPAP settings.  Goal is to wear each night for at least 6 hrs .  Do not drive if sleepy.  Work on weight loss.  Download in 1 month after new machine is set up  follow up Dr. Halford Chessman  In 4 months and As needed

## 2015-05-22 NOTE — Progress Notes (Signed)
   Subjective:    Patient ID: Holly Simmons, female    DOB: 06/14/1941, 74 y.o.   MRN: [id number]  HPI  74 yo female with OSA previous pt of Dr. Gwenette Greet now followed by Dr. Halford Chessman.    TESTS: PSG 06/05/07 >> AHI 6, SpO2 low 77% Echo 09/13/14 >> EF 55 to 60% Auto CPAP 02/02/15 to 05/01/15 >> used on 50 of 89 nights with average 6 hrs and 43 min. Average AHI is 2.7 with median CPAP 7 cm H2O and 95 th percentile CPAP 10 cm H20.   PMhx >> HTN, Diastolic CHF, s/p Tricuspid valve replacement, CHB s/p PM, A flutter, Depression, CKD, GERD, Psoriasis, Gout, Asthma  05/22/2015 Follow up : OSA  Pt returns today to discuss Home sleep study results.  She has been having trouble CPAP mask and machine , water comes into mask and it frequently has trouble over the years. Is >8hrs old.   A HST on 05/15/15 showed AHI at 29 with SaO2 low at 70%. We discussed that her previously mild OSA is now moderate to severe OSA. She would like to continue on CPAP but with a new machine.  She has an extensive cardiac hx with D CHF , TV replacement, VSD repair .  Pt education provided.  Does have daytime sleepiness.     Review of Systems Constitutional:   No  weight loss, night sweats,  Fevers, chills,  +fatigue, or  lassitude.  HEENT:   No headaches,  Difficulty swallowing,  Tooth/dental problems, or  Sore throat,                No sneezing, itching, ear ache, nasal congestion, post nasal drip,   CV:  No chest pain,  Orthopnea, PND, swelling in lower extremities, anasarca, dizziness, palpitations, syncope.   GI  No heartburn, indigestion, abdominal pain, nausea, vomiting, diarrhea, change in bowel habits, loss of appetite, bloody stools.   Resp: No shortness of breath with exertion or at rest.  No excess mucus, no productive cough,  No non-productive cough,  No coughing up of blood.  No change in color of mucus.  No wheezing.  No chest wall deformity  Skin: no rash or lesions.  GU: no dysuria, change in  color of urine, no urgency or frequency.  No flank pain, no hematuria   MS:  No joint pain or swelling.  No decreased range of motion.  No back pain.  Psych:  No change in mood or affect. No depression or anxiety.  No memory loss.         Objective:   Physical Exam GEN: A/Ox3; pleasant , NAD, overweight   HEENT:  [place]/AT,  EACs-clear, TMs-wnl, NOSE-clear, THROAT-clear, no lesions, no postnasal drip or exudate noted.   NECK:  Supple w/ fair ROM; no JVD; normal carotid impulses w/o bruits; no thyromegaly or nodules palpated; no lymphadenopathy.  RESP  Clear  P & A; w/o, wheezes/ rales/ or rhonchi.no accessory muscle use, no dullness to percussion  CARD:  RRR, no m/r/g  , no peripheral edema, pulses intact, no cyanosis or clubbing.  GI:   Soft & nt; nml bowel sounds; no organomegaly or masses detected.  Musco: Warm bil, no deformities or joint swelling noted.   Neuro: alert, no focal deficits noted.    Skin: Warm, no lesions or rashes         Assessment & Plan:

## 2015-05-22 NOTE — Addendum Note (Signed)
Addended by: Osa Craver on: 05/22/2015 11:10 AM   Modules accepted: Orders

## 2015-05-22 NOTE — Assessment & Plan Note (Signed)
Recent HST with moderate to severe OSA  Will try to get new machine .  Pt education on OSA   Plan  We are going to order you a new CPAP machine.  Continue with same CPAP settings.  Goal is to wear each night for at least 6 hrs .  Do not drive if sleepy.  Work on weight loss.  Download in 1 month after new machine is set up  follow up Dr. Halford Chessman  In 4 months and As needed

## 2015-05-23 NOTE — Progress Notes (Signed)
Reviewed and agree with assessment/plan. 

## 2015-05-30 ENCOUNTER — Ambulatory Visit (INDEPENDENT_AMBULATORY_CARE_PROVIDER_SITE_OTHER): Payer: Medicare PPO | Admitting: Internal Medicine

## 2015-05-30 ENCOUNTER — Other Ambulatory Visit (INDEPENDENT_AMBULATORY_CARE_PROVIDER_SITE_OTHER): Payer: Medicare PPO

## 2015-05-30 ENCOUNTER — Encounter: Payer: Self-pay | Admitting: Internal Medicine

## 2015-05-30 ENCOUNTER — Ambulatory Visit: Payer: Medicare PPO | Admitting: Internal Medicine

## 2015-05-30 VITALS — BP 138/70 | HR 59 | Temp 97.4°F | Resp 16 | Ht 63.0 in | Wt 184.0 lb

## 2015-05-30 DIAGNOSIS — E0821 Diabetes mellitus due to underlying condition with diabetic nephropathy: Secondary | ICD-10-CM

## 2015-05-30 DIAGNOSIS — M80079A Age-related osteoporosis with current pathological fracture, unspecified ankle and foot, initial encounter for fracture: Secondary | ICD-10-CM | POA: Insufficient documentation

## 2015-05-30 DIAGNOSIS — Z0000 Encounter for general adult medical examination without abnormal findings: Secondary | ICD-10-CM | POA: Diagnosis not present

## 2015-05-30 DIAGNOSIS — E1121 Type 2 diabetes mellitus with diabetic nephropathy: Secondary | ICD-10-CM

## 2015-05-30 DIAGNOSIS — R778 Other specified abnormalities of plasma proteins: Secondary | ICD-10-CM

## 2015-05-30 DIAGNOSIS — E785 Hyperlipidemia, unspecified: Secondary | ICD-10-CM

## 2015-05-30 DIAGNOSIS — Z23 Encounter for immunization: Secondary | ICD-10-CM | POA: Diagnosis not present

## 2015-05-30 DIAGNOSIS — M80071S Age-related osteoporosis with current pathological fracture, right ankle and foot, sequela: Secondary | ICD-10-CM | POA: Diagnosis not present

## 2015-05-30 LAB — URINALYSIS, ROUTINE W REFLEX MICROSCOPIC
Bilirubin Urine: NEGATIVE
Hgb urine dipstick: NEGATIVE
Ketones, ur: NEGATIVE
Leukocytes, UA: NEGATIVE
Nitrite: NEGATIVE
Specific Gravity, Urine: 1.025 (ref 1.000–1.030)
TOTAL PROTEIN, URINE-UPE24: 30 — AB
URINE GLUCOSE: NEGATIVE
Urobilinogen, UA: 0.2 (ref 0.0–1.0)
pH: 6 (ref 5.0–8.0)

## 2015-05-30 LAB — CBC WITH DIFFERENTIAL/PLATELET
BASOS PCT: 0 % (ref 0.0–3.0)
Basophils Absolute: 0 10*3/uL (ref 0.0–0.1)
EOS PCT: 2.2 % (ref 0.0–5.0)
Eosinophils Absolute: 0.2 10*3/uL (ref 0.0–0.7)
HCT: 38.3 % (ref 36.0–46.0)
Hemoglobin: 12.4 g/dL (ref 12.0–15.0)
Lymphocytes Relative: 11.6 % — ABNORMAL LOW (ref 12.0–46.0)
Lymphs Abs: 0.9 10*3/uL (ref 0.7–4.0)
MCHC: 32.5 g/dL (ref 30.0–36.0)
MCV: 91.8 fl (ref 78.0–100.0)
MONOS PCT: 9.6 % (ref 3.0–12.0)
Monocytes Absolute: 0.8 10*3/uL (ref 0.1–1.0)
Neutro Abs: 6 10*3/uL (ref 1.4–7.7)
Neutrophils Relative %: 76.6 % (ref 43.0–77.0)
PLATELETS: 181 10*3/uL (ref 150.0–400.0)
RBC: 4.17 Mil/uL (ref 3.87–5.11)
RDW: 15.3 % (ref 11.5–15.5)
WBC: 7.8 10*3/uL (ref 4.0–10.5)

## 2015-05-30 LAB — MICROALBUMIN / CREATININE URINE RATIO
Creatinine,U: 202 mg/dL
Microalb Creat Ratio: 10.4 mg/g (ref 0.0–30.0)
Microalb, Ur: 21.1 mg/dL — ABNORMAL HIGH (ref 0.0–1.9)

## 2015-05-30 LAB — HEPATIC FUNCTION PANEL
ALT: 11 U/L (ref 0–35)
AST: 18 U/L (ref 0–37)
Albumin: 4.2 g/dL (ref 3.5–5.2)
Alkaline Phosphatase: 138 U/L — ABNORMAL HIGH (ref 39–117)
BILIRUBIN DIRECT: 0.2 mg/dL (ref 0.0–0.3)
TOTAL PROTEIN: 8 g/dL (ref 6.0–8.3)
Total Bilirubin: 0.6 mg/dL (ref 0.2–1.2)

## 2015-05-30 LAB — BASIC METABOLIC PANEL
BUN: 28 mg/dL — ABNORMAL HIGH (ref 6–23)
CHLORIDE: 100 meq/L (ref 96–112)
CO2: 32 meq/L (ref 19–32)
Calcium: 10.1 mg/dL (ref 8.4–10.5)
Creatinine, Ser: 1.43 mg/dL — ABNORMAL HIGH (ref 0.40–1.20)
GFR: 38.35 mL/min — ABNORMAL LOW (ref >60.00)
Glucose, Bld: 95 mg/dL (ref 70–99)
Potassium: 4 mEq/L (ref 3.5–5.1)
SODIUM: 139 meq/L (ref 135–145)

## 2015-05-30 LAB — TSH: TSH: 1.81 u[IU]/mL (ref 0.35–4.50)

## 2015-05-30 LAB — HEMOGLOBIN A1C: Hgb A1c MFr Bld: 6.4 % (ref 4.6–6.5)

## 2015-05-30 NOTE — Progress Notes (Signed)
Pre visit review using our clinic review tool, if applicable. No additional management support is needed unless otherwise documented below in the visit note. 

## 2015-05-30 NOTE — Progress Notes (Signed)
Subjective:  Patient ID: Holly Simmons, female    DOB: [birth year]  Age: 74 y.o. MRN: [id number]  CC: Annual Exam; Hypertension; Hyperlipidemia; and Diabetes   HPI Holly Simmons presents for a CPX and follow up on medical issues. She offers no new complaints today.  Outpatient Prescriptions Prior to Visit  Medication Sig Dispense Refill  . allopurinol (ZYLOPRIM) 100 MG tablet TAKE ONE TABLET BY MOUTH ONE TIME DAILY  90 tablet 2  . ampicillin (PRINCIPEN) 500 MG capsule Take 500 mg by mouth daily as needed (for dental work only.).    Marland Kitchen Armodafinil 150 MG tablet Take 1 tablet (150 mg total) by mouth daily as needed (for driving). 30 tablet 2  . B Complex-C (B-COMPLEX WITH VITAMIN C) tablet Take 1 tablet by mouth daily.    . Betamethasone Valerate 0.12 % foam Apply 1 application topically daily as needed (for psoriasis).    . budesonide-formoterol (SYMBICORT) 80-4.5 MCG/ACT inhaler Inhale 2 puffs into the lungs 2 (two) times daily. 1 Inhaler 11  . buPROPion (WELLBUTRIN XL) 150 MG 24 hr tablet Take 1 tablet (150 mg total) by mouth daily. 90 tablet 2  . fexofenadine (ALLEGRA) 180 MG tablet Take 180 mg by mouth daily as needed for allergies.     . furosemide (LASIX) 40 MG tablet Take 1 tablet (40 mg total) by mouth every other day. 45 tablet 2  . hydrocortisone (ANUSOL-HC) 25 MG suppository Place 25 mg rectally daily as needed for hemorrhoids.    . metoprolol succinate (TOPROL-XL) 100 MG 24 hr tablet Take 1 tablet (100 mg total) by mouth daily. Take with or immediately following a meal. 90 tablet 3  . Multiple Vitamin (MULTIVITAMIN) tablet Take 1 tablet by mouth daily.    . ranitidine (ZANTAC) 300 MG tablet Take 1 tablet (300 mg total) by mouth at bedtime. 90 tablet 3  . spironolactone (ALDACTONE) 25 MG tablet Take 1 tablet (25 mg total) by mouth every other day. 45 tablet 1  . TRADJENTA 5 MG TABS tablet TAKE ONE TABLET BY MOUTH ONE TIME DAILY  90 tablet 2  . triamcinolone cream  (KENALOG) 0.5 % Apply 1 application topically 3 (three) times daily as needed (itching).     . Vilazodone HCl (VIIBRYD) 40 MG TABS Take 1 tablet (40 mg total) by mouth daily. 90 tablet 2  . warfarin (COUMADIN) 2.5 MG tablet TAKE 1 TO 1 & 1/2 TABLET BY MOUTH DAILY 45 tablet 3   No facility-administered medications prior to visit.    ROS Review of Systems  Constitutional: Positive for fatigue. Negative for fever, chills, diaphoresis, appetite change and unexpected weight change.  HENT: Negative.  Negative for trouble swallowing and voice change.   Eyes: Negative.   Respiratory: Negative for cough, choking, chest tightness, shortness of breath and stridor.   Cardiovascular: Negative.  Negative for chest pain, palpitations and leg swelling.  Gastrointestinal: Negative.  Negative for nausea, vomiting, abdominal pain, diarrhea, constipation and blood in stool.  Endocrine: Negative.  Negative for polydipsia, polyphagia and polyuria.  Genitourinary: Negative.  Negative for dysuria, urgency, hematuria, flank pain and difficulty urinating.  Musculoskeletal: Negative.  Negative for myalgias, back pain, arthralgias and neck pain.  Skin: Negative.  Negative for rash.  Allergic/Immunologic: Negative.   Neurological: Negative.  Negative for dizziness, tremors, syncope, light-headedness, numbness and headaches.  Hematological: Negative.  Negative for adenopathy. Does not bruise/bleed easily.  Psychiatric/Behavioral: Positive for dysphoric mood. Negative for suicidal ideas, hallucinations, behavioral problems, confusion, sleep  disturbance, self-injury, decreased concentration and agitation. The patient is not nervous/anxious and is not hyperactive.     Objective:  BP 138/70 mmHg  Pulse 59  Temp(Src) 97.4 F (36.3 C) (Oral)  Resp 16  Ht 5\' 3"  (1.6 m)  Wt 184 lb (83.462 kg)  BMI 32.60 kg/m2  SpO2 94%  BP Readings from Last 3 Encounters:  05/30/15 138/70  05/22/15 138/72  05/02/15 122/62    Wt  Readings from Last 3 Encounters:  05/30/15 184 lb (83.462 kg)  05/22/15 182 lb 6.4 oz (82.736 kg)  05/02/15 181 lb 9.6 oz (82.373 kg)    Physical Exam  Constitutional: She is oriented to person, place, and time. She appears well-developed and well-nourished. No distress.  HENT:  Head: Normocephalic and atraumatic.  Mouth/Throat: Oropharynx is clear and moist. No oropharyngeal exudate.  Eyes: Conjunctivae are normal. Right eye exhibits no discharge. Left eye exhibits no discharge. No scleral icterus.  Neck: Normal range of motion. Neck supple. No JVD present. No tracheal deviation present. No thyromegaly present.  Cardiovascular: Normal rate, regular rhythm, S1 normal, S2 normal and intact distal pulses.  Exam reveals no gallop, no S3, no S4 and no friction rub.   Murmur heard.  Crescendo systolic murmur is present   No diastolic murmur is present  Pulmonary/Chest: Effort normal and breath sounds normal. No stridor. No respiratory distress. She has no wheezes. She has no rales. She exhibits no tenderness.  Abdominal: Soft. Bowel sounds are normal. She exhibits no distension and no mass. There is no tenderness. There is no rebound and no guarding.  Musculoskeletal: Normal range of motion. She exhibits no edema or tenderness.  Lymphadenopathy:    She has no cervical adenopathy.  Neurological: She is oriented to person, place, and time.  Skin: Skin is warm and dry. No rash noted. She is not diaphoretic. No erythema. No pallor.  Psychiatric: She has a normal mood and affect. Her behavior is normal. Judgment and thought content normal. Her mood appears not anxious. Her speech is not rapid and/or pressured. She does not exhibit a depressed mood. She expresses no homicidal and no suicidal ideation. She expresses no suicidal plans and no homicidal plans.    Lab Results  Component Value Date   WBC 7.8 05/30/2015   HGB 12.4 05/30/2015   HCT 38.3 05/30/2015   PLT 181.0 05/30/2015   GLUCOSE 95  05/30/2015   CHOL 135 05/24/2014   TRIG 80.0 05/24/2014   HDL 49.80 05/24/2014   LDLCALC 69 05/24/2014   ALT 11 05/30/2015   AST 18 05/30/2015   NA 139 05/30/2015   K 4.0 05/30/2015   CL 100 05/30/2015   CREATININE 1.43* 05/30/2015   BUN 28* 05/30/2015   CO2 32 05/30/2015   TSH 1.81 05/30/2015   INR 3.0 05/16/2015   HGBA1C 6.4 05/30/2015   MICROALBUR 21.1* 05/30/2015    [place] Pant[place] Axilla  03/21/2015  CLINICAL DATA:  74 year old female seen for follow-up of a probably benign left breast mass EXAM: DIGITAL DIAGNOSTIC BILATERAL MAMMOGRAM WITH 3D TOMOSYNTHESIS WITH CAD ULTRASOUND LEFT BREAST COMPARISON:  Previous exam(s). ACR Breast Density Category c: The breast tissue is heterogeneously dense, which may obscure small masses. FINDINGS: The previously noted oval mass within the medial left breast is not significantly changed in size from mammogram dated 03/09/2014. No new abnormality is identified within either breast. Mammographic images were processed with CAD. On physical exam, no discrete mass is identified in the area of  concern within the medial left breast. Targeted ultrasound is performed, showing a similar-appearing oval, circumscribed, hypoechoic mass at 9 o'clock, 3 cm from the nipple measuring 5 x 2 x 5 mm, not significantly changed from prior studies. This mass is again thought to likely represent a cluster of cysts. IMPRESSION: Probably benign left breast finding. RECOMMENDATION: Bilateral diagnostic mammogram and left breast ultrasound in 1 year. I have discussed the findings and recommendations with the patient. Results were also provided in writing at the conclusion of the visit. If applicable, a reminder letter will be sent to the patient regarding the next appointment. BI-RADS CATEGORY  3: Probably benign. Electronically Signed   By: Pamelia Hoit M.D.   On: 03/21/2015 12:03   Mm Diag Breast Tomo Bilateral  03/21/2015  CLINICAL DATA:  74 year old female seen for  follow-up of a probably benign left breast mass EXAM: DIGITAL DIAGNOSTIC BILATERAL MAMMOGRAM WITH 3D TOMOSYNTHESIS WITH CAD ULTRASOUND LEFT BREAST COMPARISON:  Previous exam(s). ACR Breast Density Category c: The breast tissue is heterogeneously dense, which may obscure small masses. FINDINGS: The previously noted oval mass within the medial left breast is not significantly changed in size from mammogram dated 03/09/2014. No new abnormality is identified within either breast. Mammographic images were processed with CAD. On physical exam, no discrete mass is identified in the area of concern within the medial left breast. Targeted ultrasound is performed, showing a similar-appearing oval, circumscribed, hypoechoic mass at 9 o'clock, 3 cm from the nipple measuring 5 x 2 x 5 mm, not significantly changed from prior studies. This mass is again thought to likely represent a cluster of cysts. IMPRESSION: Probably benign left breast finding. RECOMMENDATION: Bilateral diagnostic mammogram and left breast ultrasound in 1 year. I have discussed the findings and recommendations with the patient. Results were also provided in writing at the conclusion of the visit. If applicable, a reminder letter will be sent to the patient regarding the next appointment. BI-RADS CATEGORY  3: Probably benign. Electronically Signed   By: Pamelia Hoit M.D.   On: 03/21/2015 12:03    Assessment & Plan:   Karren was seen today for annual exam, hypertension, hyperlipidemia and diabetes.  Diagnoses and all orders for this visit:  Diabetes mellitus due to underlying condition with diabetic nephropathy, without long-term current use of insulin ([place])- her blood sugars are well controlled -     Urinalysis, Routine w reflex microscopic (not at [facility]); Future -     Microalbumin / creatinine urine ratio; Future -     Basic metabolic panel; Future  Hyperlipidemia with target LDL less than 100- she has achieved her LDL goal -     Lipid panel;  Future -     TSH; Future -     Hemoglobin A1c; Future -     Hepatic function panel; Future  Elevated total protein- this has resolved, her total protein level is normal now -     CBC with Differential/Platelet; Future -     Hepatic function panel; Future  Osteoporosis with pathological fracture of ankle and foot, right, sequela- will start a Vit D supplement, she is also due for a DEXA scan -     Vit D  25 hydroxy (rtn osteoporosis monitoring); Future -     DG Bone Density; Future -     Cholecalciferol 2000 UNITS TABS; Take 1 tablet (2,000 Units total) by mouth daily.  Need for prophylactic vaccination against Streptococcus pneumoniae (pneumococcus) -     Pneumococcal polysaccharide vaccine  23-valent greater than or equal to 2yo subcutaneous/IM   I am having Ms. Schwebach start on Cholecalciferol. I am also having her maintain her fexofenadine, multivitamin, B-complex with vitamin C, hydrocortisone, triamcinolone cream, TRADJENTA, allopurinol, buPROPion, ranitidine, budesonide-formoterol, metoprolol succinate, furosemide, ampicillin, Betamethasone Valerate, warfarin, Vilazodone HCl, Armodafinil, and spironolactone.  Meds ordered this encounter  Medications  . Cholecalciferol 2000 UNITS TABS    Sig: Take 1 tablet (2,000 Units total) by mouth daily.    Dispense:  90 tablet    Refill:  3   See AVS for instructions about healthy living and anticipatory guidance.  Follow-up: Return in about 4 months (around 09/30/2015).  Scarlette Calico, MD

## 2015-05-30 NOTE — Patient Instructions (Signed)
Preventive Care for Adults, Female A healthy lifestyle and preventive care can promote health and wellness. Preventive health guidelines for women include the following key practices.  A routine yearly physical is a good way to check with your health care provider about your health and preventive screening. It is a chance to share any concerns and updates on your health and to receive a thorough exam.  Visit your dentist for a routine exam and preventive care every 6 months. Brush your teeth twice a day and floss once a day. Good oral hygiene prevents tooth decay and gum disease.  The frequency of eye exams is based on your age, health, family medical history, use of contact lenses, and other factors. Follow your health care provider's recommendations for frequency of eye exams.  Eat a healthy diet. Foods like vegetables, fruits, whole grains, low-fat dairy products, and lean protein foods contain the nutrients you need without too many calories. Decrease your intake of foods high in solid fats, added sugars, and salt. Eat the right amount of calories for you.Get information about a proper diet from your health care provider, if necessary.  Regular physical exercise is one of the most important things you can do for your health. Most adults should get at least 150 minutes of moderate-intensity exercise (any activity that increases your heart rate and causes you to sweat) each week. In addition, most adults need muscle-strengthening exercises on 2 or more days a week.  Maintain a healthy weight. The body mass index (BMI) is a screening tool to identify possible weight problems. It provides an estimate of body fat based on height and weight. Your health care provider can find your BMI and can help you achieve or maintain a healthy weight.For adults 20 years and older:  A BMI below 18.5 is considered underweight.  A BMI of 18.5 to 24.9 is normal.  A BMI of 25 to 29.9 is considered overweight.  A  BMI of 30 and above is considered obese.  Maintain normal blood lipids and cholesterol levels by exercising and minimizing your intake of saturated fat. Eat a balanced diet with plenty of fruit and vegetables. Blood tests for lipids and cholesterol should begin at age 45 and be repeated every 5 years. If your lipid or cholesterol levels are high, you are over 50, or you are at high risk for heart disease, you may need your cholesterol levels checked more frequently.Ongoing high lipid and cholesterol levels should be treated with medicines if diet and exercise are not working.  If you smoke, find out from your health care provider how to quit. If you do not use tobacco, do not start.  Lung cancer screening is recommended for adults aged 45-80 years who are at high risk for developing lung cancer because of a history of smoking. A yearly low-dose CT scan of the lungs is recommended for people who have at least a 30-pack-year history of smoking and are a current smoker or have quit within the past 15 years. A pack year of smoking is smoking an average of 1 pack of cigarettes a day for 1 year (for example: 1 pack a day for 30 years or 2 packs a day for 15 years). Yearly screening should continue until the smoker has stopped smoking for at least 15 years. Yearly screening should be stopped for people who develop a health problem that would prevent them from having lung cancer treatment.  If you are pregnant, do not drink alcohol. If you are  breastfeeding, be very cautious about drinking alcohol. If you are not pregnant and choose to drink alcohol, do not have more than 1 drink per day. One drink is considered to be 12 ounces (355 mL) of beer, 5 ounces (148 mL) of wine, or 1.5 ounces (44 mL) of liquor.  Avoid use of street drugs. Do not share needles with anyone. Ask for help if you need support or instructions about stopping the use of drugs.  High blood pressure causes heart disease and increases the risk  of stroke. Your blood pressure should be checked at least every 1 to 2 years. Ongoing high blood pressure should be treated with medicines if weight loss and exercise do not work.  If you are 55-79 years old, ask your health care provider if you should take aspirin to prevent strokes.  Diabetes screening is done by taking a blood sample to check your blood glucose level after you have not eaten for a certain period of time (fasting). If you are not overweight and you do not have risk factors for diabetes, you should be screened once every 3 years starting at age 45. If you are overweight or obese and you are 40-70 years of age, you should be screened for diabetes every year as part of your cardiovascular risk assessment.  Breast cancer screening is essential preventive care for women. You should practice "breast self-awareness." This means understanding the normal appearance and feel of your breasts and may include breast self-examination. Any changes detected, no matter how small, should be reported to a health care provider. Women in their 20s and 30s should have a clinical breast exam (CBE) by a health care provider as part of a regular health exam every 1 to 3 years. After age 40, women should have a CBE every year. Starting at age 40, women should consider having a mammogram (breast X-ray test) every year. Women who have a family history of breast cancer should talk to their health care provider about genetic screening. Women at a high risk of breast cancer should talk to their health care providers about having an MRI and a mammogram every year.  Breast cancer gene (BRCA)-related cancer risk assessment is recommended for women who have family members with BRCA-related cancers. BRCA-related cancers include breast, ovarian, tubal, and peritoneal cancers. Having family members with these cancers may be associated with an increased risk for harmful changes (mutations) in the breast cancer genes BRCA1 and  BRCA2. Results of the assessment will determine the need for genetic counseling and BRCA1 and BRCA2 testing.  Your health care provider may recommend that you be screened regularly for cancer of the pelvic organs (ovaries, uterus, and vagina). This screening involves a pelvic examination, including checking for microscopic changes to the surface of your cervix (Pap test). You may be encouraged to have this screening done every 3 years, beginning at age 21.  For women ages 30-65, health care providers may recommend pelvic exams and Pap testing every 3 years, or they may recommend the Pap and pelvic exam, combined with testing for human papilloma virus (HPV), every 5 years. Some types of HPV increase your risk of cervical cancer. Testing for HPV may also be done on women of any age with unclear Pap test results.  Other health care providers may not recommend any screening for nonpregnant women who are considered low risk for pelvic cancer and who do not have symptoms. Ask your health care provider if a screening pelvic exam is right for   you.  If you have had past treatment for cervical cancer or a condition that could lead to cancer, you need Pap tests and screening for cancer for at least 20 years after your treatment. If Pap tests have been discontinued, your risk factors (such as having a new sexual partner) need to be reassessed to determine if screening should resume. Some women have medical problems that increase the chance of getting cervical cancer. In these cases, your health care provider may recommend more frequent screening and Pap tests.  Colorectal cancer can be detected and often prevented. Most routine colorectal cancer screening begins at the age of 50 years and continues through age 75 years. However, your health care provider may recommend screening at an earlier age if you have risk factors for colon cancer. On a yearly basis, your health care provider may provide home test kits to check  for hidden blood in the stool. Use of a small camera at the end of a tube, to directly examine the colon (sigmoidoscopy or colonoscopy), can detect the earliest forms of colorectal cancer. Talk to your health care provider about this at age 50, when routine screening begins. Direct exam of the colon should be repeated every 5-10 years through age 75 years, unless early forms of precancerous polyps or small growths are found.  People who are at an increased risk for hepatitis B should be screened for this virus. You are considered at high risk for hepatitis B if:  You were born in a country where hepatitis B occurs often. Talk with your health care provider about which countries are considered high risk.  Your parents were born in a high-risk country and you have not received a shot to protect against hepatitis B (hepatitis B vaccine).  You have HIV or AIDS.  You use needles to inject street drugs.  You live with, or have sex with, someone who has hepatitis B.  You get hemodialysis treatment.  You take certain medicines for conditions like cancer, organ transplantation, and autoimmune conditions.  Hepatitis C blood testing is recommended for all people born from 1945 through 1965 and any individual with known risks for hepatitis C.  Practice safe sex. Use condoms and avoid high-risk sexual practices to reduce the spread of sexually transmitted infections (STIs). STIs include gonorrhea, chlamydia, syphilis, trichomonas, herpes, HPV, and human immunodeficiency virus (HIV). Herpes, HIV, and HPV are viral illnesses that have no cure. They can result in disability, cancer, and death.  You should be screened for sexually transmitted illnesses (STIs) including gonorrhea and chlamydia if:  You are sexually active and are younger than 24 years.  You are older than 24 years and your health care provider tells you that you are at risk for this type of infection.  Your sexual activity has changed  since you were last screened and you are at an increased risk for chlamydia or gonorrhea. Ask your health care provider if you are at risk.  If you are at risk of being infected with HIV, it is recommended that you take a prescription medicine daily to prevent HIV infection. This is called preexposure prophylaxis (PrEP). You are considered at risk if:  You are sexually active and do not regularly use condoms or know the HIV status of your partner(s).  You take drugs by injection.  You are sexually active with a partner who has HIV.  Talk with your health care provider about whether you are at high risk of being infected with HIV. If   you choose to begin PrEP, you should first be tested for HIV. You should then be tested every 3 months for as long as you are taking PrEP.  Osteoporosis is a disease in which the bones lose minerals and strength with aging. This can result in serious bone fractures or breaks. The risk of osteoporosis can be identified using a bone density scan. Women ages 67 years and over and women at risk for fractures or osteoporosis should discuss screening with their health care providers. Ask your health care provider whether you should take a calcium supplement or vitamin D to reduce the rate of osteoporosis.  Menopause can be associated with physical symptoms and risks. Hormone replacement therapy is available to decrease symptoms and risks. You should talk to your health care provider about whether hormone replacement therapy is right for you.  Use sunscreen. Apply sunscreen liberally and repeatedly throughout the day. You should seek shade when your shadow is shorter than you. Protect yourself by wearing long sleeves, pants, a wide-brimmed hat, and sunglasses year round, whenever you are outdoors.  Once a month, do a whole body skin exam, using a mirror to look at the skin on your back. Tell your health care provider of new moles, moles that have irregular borders, moles that  are larger than a pencil eraser, or moles that have changed in shape or color.  Stay current with required vaccines (immunizations).  Influenza vaccine. All adults should be immunized every year.  Tetanus, diphtheria, and acellular pertussis (Td, Tdap) vaccine. Pregnant women should receive 1 dose of Tdap vaccine during each pregnancy. The dose should be obtained regardless of the length of time since the last dose. Immunization is preferred during the 27th-36th week of gestation. An adult who has not previously received Tdap or who does not know her vaccine status should receive 1 dose of Tdap. This initial dose should be followed by tetanus and diphtheria toxoids (Td) booster doses every 10 years. Adults with an unknown or incomplete history of completing a 3-dose immunization series with Td-containing vaccines should begin or complete a primary immunization series including a Tdap dose. Adults should receive a Td booster every 10 years.  Varicella vaccine. An adult without evidence of immunity to varicella should receive 2 doses or a second dose if she has previously received 1 dose. Pregnant females who do not have evidence of immunity should receive the first dose after pregnancy. This first dose should be obtained before leaving the health care facility. The second dose should be obtained 4-8 weeks after the first dose.  Human papillomavirus (HPV) vaccine. Females aged 13-26 years who have not received the vaccine previously should obtain the 3-dose series. The vaccine is not recommended for use in pregnant females. However, pregnancy testing is not needed before receiving a dose. If a female is found to be pregnant after receiving a dose, no treatment is needed. In that case, the remaining doses should be delayed until after the pregnancy. Immunization is recommended for any person with an immunocompromised condition through the age of 61 years if she did not get any or all doses earlier. During the  3-dose series, the second dose should be obtained 4-8 weeks after the first dose. The third dose should be obtained 24 weeks after the first dose and 16 weeks after the second dose.  Zoster vaccine. One dose is recommended for adults aged 30 years or older unless certain conditions are present.  Measles, mumps, and rubella (MMR) vaccine. Adults born  before 1957 generally are considered immune to measles and mumps. Adults born in 1957 or later should have 1 or more doses of MMR vaccine unless there is a contraindication to the vaccine or there is laboratory evidence of immunity to each of the three diseases. A routine second dose of MMR vaccine should be obtained at least 28 days after the first dose for students attending postsecondary schools, health care workers, or international travelers. People who received inactivated measles vaccine or an unknown type of measles vaccine during 1963-1967 should receive 2 doses of MMR vaccine. People who received inactivated mumps vaccine or an unknown type of mumps vaccine before 1979 and are at high risk for mumps infection should consider immunization with 2 doses of MMR vaccine. For females of childbearing age, rubella immunity should be determined. If there is no evidence of immunity, females who are not pregnant should be vaccinated. If there is no evidence of immunity, females who are pregnant should delay immunization until after pregnancy. Unvaccinated health care workers born before 1957 who lack laboratory evidence of measles, mumps, or rubella immunity or laboratory confirmation of disease should consider measles and mumps immunization with 2 doses of MMR vaccine or rubella immunization with 1 dose of MMR vaccine.  Pneumococcal 13-valent conjugate (PCV13) vaccine. When indicated, a person who is uncertain of his immunization history and has no record of immunization should receive the PCV13 vaccine. All adults 65 years of age and older should receive this  vaccine. An adult aged 19 years or older who has certain medical conditions and has not been previously immunized should receive 1 dose of PCV13 vaccine. This PCV13 should be followed with a dose of pneumococcal polysaccharide (PPSV23) vaccine. Adults who are at high risk for pneumococcal disease should obtain the PPSV23 vaccine at least 8 weeks after the dose of PCV13 vaccine. Adults older than 72 years of age who have normal immune system function should obtain the PPSV23 vaccine dose at least 1 year after the dose of PCV13 vaccine.  Pneumococcal polysaccharide (PPSV23) vaccine. When PCV13 is also indicated, PCV13 should be obtained first. All adults aged 65 years and older should be immunized. An adult younger than age 65 years who has certain medical conditions should be immunized. Any person who resides in a nursing home or long-term care facility should be immunized. An adult smoker should be immunized. People with an immunocompromised condition and certain other conditions should receive both PCV13 and PPSV23 vaccines. People with human immunodeficiency virus (HIV) infection should be immunized as soon as possible after diagnosis. Immunization during chemotherapy or radiation therapy should be avoided. Routine use of PPSV23 vaccine is not recommended for American Indians, [place] Natives, or people younger than 65 years unless there are medical conditions that require PPSV23 vaccine. When indicated, people who have unknown immunization and have no record of immunization should receive PPSV23 vaccine. One-time revaccination 5 years after the first dose of PPSV23 is recommended for people aged 19-64 years who have chronic kidney failure, nephrotic syndrome, asplenia, or immunocompromised conditions. People who received 1-2 doses of PPSV23 before age 65 years should receive another dose of PPSV23 vaccine at age 65 years or later if at least 5 years have passed since the previous dose. Doses of PPSV23 are not  needed for people immunized with PPSV23 at or after age 65 years.  Meningococcal vaccine. Adults with asplenia or persistent complement component deficiencies should receive 2 doses of quadrivalent meningococcal conjugate (MenACWY-D) vaccine. The doses should be obtained   at least 2 months apart. Microbiologists working with certain meningococcal bacteria, [place] recruits, people at risk during an outbreak, and people who travel to or live in countries with a high rate of meningitis should be immunized. A first-year college student up through age 34 years who is living in a residence hall should receive a dose if she did not receive a dose on or after her 16th birthday. Adults who have certain high-risk conditions should receive one or more doses of vaccine.  Hepatitis A vaccine. Adults who wish to be protected from this disease, have certain high-risk conditions, work with hepatitis A-infected animals, work in hepatitis A research labs, or travel to or work in countries with a high rate of hepatitis A should be immunized. Adults who were previously unvaccinated and who anticipate close contact with an international adoptee during the first 60 days after arrival in the [place] from a country with a high rate of hepatitis A should be immunized.  Hepatitis B vaccine. Adults who wish to be protected from this disease, have certain high-risk conditions, may be exposed to blood or other infectious body fluids, are household contacts or sex partners of hepatitis B positive people, are clients or workers in certain care facilities, or travel to or work in countries with a high rate of hepatitis B should be immunized.  Haemophilus influenzae type b (Hib) vaccine. A previously unvaccinated person with asplenia or sickle cell disease or having a scheduled splenectomy should receive 1 dose of Hib vaccine. Regardless of previous immunization, a recipient of a hematopoietic stem cell transplant should receive a  3-dose series 6-12 months after her successful transplant. Hib vaccine is not recommended for adults with HIV infection. Preventive Services / Frequency Ages 35 to 4 years  Blood pressure check.** / Every 3-5 years.  Lipid and cholesterol check.** / Every 5 years beginning at age 60.  Clinical breast exam.** / Every 3 years for women in their 71s and 10s.  BRCA-related cancer risk assessment.** / For women who have family members with a BRCA-related cancer (breast, ovarian, tubal, or peritoneal cancers).  Pap test.** / Every 2 years from ages 76 through 26. Every 3 years starting at age 61 through age 76 or 93 with a history of 3 consecutive normal Pap tests.  HPV screening.** / Every 3 years from ages 37 through ages 60 to 51 with a history of 3 consecutive normal Pap tests.  Hepatitis C blood test.** / For any individual with known risks for hepatitis C.  Skin self-exam. / Monthly.  Influenza vaccine. / Every year.  Tetanus, diphtheria, and acellular pertussis (Tdap, Td) vaccine.** / Consult your health care provider. Pregnant women should receive 1 dose of Tdap vaccine during each pregnancy. 1 dose of Td every 10 years.  Varicella vaccine.** / Consult your health care provider. Pregnant females who do not have evidence of immunity should receive the first dose after pregnancy.  HPV vaccine. / 3 doses over 6 months, if 93 and younger. The vaccine is not recommended for use in pregnant females. However, pregnancy testing is not needed before receiving a dose.  Measles, mumps, rubella (MMR) vaccine.** / You need at least 1 dose of MMR if you were born in 1957 or later. You may also need a 2nd dose. For females of childbearing age, rubella immunity should be determined. If there is no evidence of immunity, females who are not pregnant should be vaccinated. If there is no evidence of immunity, females who are  pregnant should delay immunization until after pregnancy.  Pneumococcal  13-valent conjugate (PCV13) vaccine.** / Consult your health care provider.  Pneumococcal polysaccharide (PPSV23) vaccine.** / 1 to 2 doses if you smoke cigarettes or if you have certain conditions.  Meningococcal vaccine.** / 1 dose if you are age 68 to 8 years and a Market researcher living in a residence hall, or have one of several medical conditions, you need to get vaccinated against meningococcal disease. You may also need additional booster doses.  Hepatitis A vaccine.** / Consult your health care provider.  Hepatitis B vaccine.** / Consult your health care provider.  Haemophilus influenzae type b (Hib) vaccine.** / Consult your health care provider. Ages 7 to 53 years  Blood pressure check.** / Every year.  Lipid and cholesterol check.** / Every 5 years beginning at age 25 years.  Lung cancer screening. / Every year if you are aged 11-80 years and have a 30-pack-year history of smoking and currently smoke or have quit within the past 15 years. Yearly screening is stopped once you have quit smoking for at least 15 years or develop a health problem that would prevent you from having lung cancer treatment.  Clinical breast exam.** / Every year after age 48 years.  BRCA-related cancer risk assessment.** / For women who have family members with a BRCA-related cancer (breast, ovarian, tubal, or peritoneal cancers).  Mammogram.** / Every year beginning at age 41 years and continuing for as long as you are in good health. Consult with your health care provider.  Pap test.** / Every 3 years starting at age 65 years through age 37 or 70 years with a history of 3 consecutive normal Pap tests.  HPV screening.** / Every 3 years from ages 72 years through ages 60 to 40 years with a history of 3 consecutive normal Pap tests.  Fecal occult blood test (FOBT) of stool. / Every year beginning at age 21 years and continuing until age 5 years. You may not need to do this test if you get  a colonoscopy every 10 years.  Flexible sigmoidoscopy or colonoscopy.** / Every 5 years for a flexible sigmoidoscopy or every 10 years for a colonoscopy beginning at age 35 years and continuing until age 48 years.  Hepatitis C blood test.** / For all people born from [birth year] through 1965 and any individual with known risks for hepatitis C.  Skin self-exam. / Monthly.  Influenza vaccine. / Every year.  Tetanus, diphtheria, and acellular pertussis (Tdap/Td) vaccine.** / Consult your health care provider. Pregnant women should receive 1 dose of Tdap vaccine during each pregnancy. 1 dose of Td every 10 years.  Varicella vaccine.** / Consult your health care provider. Pregnant females who do not have evidence of immunity should receive the first dose after pregnancy.  Zoster vaccine.** / 1 dose for adults aged 30 years or older.  Measles, mumps, rubella (MMR) vaccine.** / You need at least 1 dose of MMR if you were born in 1957 or later. You may also need a second dose. For females of childbearing age, rubella immunity should be determined. If there is no evidence of immunity, females who are not pregnant should be vaccinated. If there is no evidence of immunity, females who are pregnant should delay immunization until after pregnancy.  Pneumococcal 13-valent conjugate (PCV13) vaccine.** / Consult your health care provider.  Pneumococcal polysaccharide (PPSV23) vaccine.** / 1 to 2 doses if you smoke cigarettes or if you have certain conditions.  Meningococcal vaccine.** /  Consult your health care provider.  Hepatitis A vaccine.** / Consult your health care provider.  Hepatitis B vaccine.** / Consult your health care provider.  Haemophilus influenzae type b (Hib) vaccine.** / Consult your health care provider. Ages 64 years and over  Blood pressure check.** / Every year.  Lipid and cholesterol check.** / Every 5 years beginning at age 23 years.  Lung cancer screening. / Every year if you  are aged 16-80 years and have a 30-pack-year history of smoking and currently smoke or have quit within the past 15 years. Yearly screening is stopped once you have quit smoking for at least 15 years or develop a health problem that would prevent you from having lung cancer treatment.  Clinical breast exam.** / Every year after age 74 years.  BRCA-related cancer risk assessment.** / For women who have family members with a BRCA-related cancer (breast, ovarian, tubal, or peritoneal cancers).  Mammogram.** / Every year beginning at age 44 years and continuing for as long as you are in good health. Consult with your health care provider.  Pap test.** / Every 3 years starting at age 58 years through age 22 or 39 years with 3 consecutive normal Pap tests. Testing can be stopped between 65 and 70 years with 3 consecutive normal Pap tests and no abnormal Pap or HPV tests in the past 10 years.  HPV screening.** / Every 3 years from ages 64 years through ages 70 or 61 years with a history of 3 consecutive normal Pap tests. Testing can be stopped between 65 and 70 years with 3 consecutive normal Pap tests and no abnormal Pap or HPV tests in the past 10 years.  Fecal occult blood test (FOBT) of stool. / Every year beginning at age 40 years and continuing until age 27 years. You may not need to do this test if you get a colonoscopy every 10 years.  Flexible sigmoidoscopy or colonoscopy.** / Every 5 years for a flexible sigmoidoscopy or every 10 years for a colonoscopy beginning at age 7 years and continuing until age 32 years.  Hepatitis C blood test.** / For all people born from [birth year] through 1963 and any individual with known risks for hepatitis C.  Osteoporosis screening.** / A one-time screening for women ages 30 years and over and women at risk for fractures or osteoporosis.  Skin self-exam. / Monthly.  Influenza vaccine. / Every year.  Tetanus, diphtheria, and acellular pertussis (Tdap/Td)  vaccine.** / 1 dose of Td every 10 years.  Varicella vaccine.** / Consult your health care provider.  Zoster vaccine.** / 1 dose for adults aged 35 years or older.  Pneumococcal 13-valent conjugate (PCV13) vaccine.** / Consult your health care provider.  Pneumococcal polysaccharide (PPSV23) vaccine.** / 1 dose for all adults aged 46 years and older.  Meningococcal vaccine.** / Consult your health care provider.  Hepatitis A vaccine.** / Consult your health care provider.  Hepatitis B vaccine.** / Consult your health care provider.  Haemophilus influenzae type b (Hib) vaccine.** / Consult your health care provider. ** Family history and personal history of risk and conditions may change your health care provider's recommendations.   This information is not intended to replace advice given to you by your health care provider. Make sure you discuss any questions you have with your health care provider.   Document Released: 09/16/2001 Document Revised: 08/11/2014 Document Reviewed: 12/16/2010 Elsevier Interactive Patient Education [employer].

## 2015-05-31 ENCOUNTER — Encounter: Payer: Self-pay | Admitting: Internal Medicine

## 2015-05-31 LAB — VITAMIN D 25 HYDROXY (VIT D DEFICIENCY, FRACTURES): VITD: 35.28 ng/mL (ref 30.00–100.00)

## 2015-05-31 MED ORDER — CHOLECALCIFEROL 50 MCG (2000 UT) PO TABS: 1.0000 | ORAL_TABLET | Freq: Every day | ORAL | Status: DC

## 2015-05-31 NOTE — Assessment & Plan Note (Signed)

## 2015-06-13 ENCOUNTER — Ambulatory Visit (INDEPENDENT_AMBULATORY_CARE_PROVIDER_SITE_OTHER): Payer: Medicare PPO | Admitting: *Deleted

## 2015-06-13 ENCOUNTER — Ambulatory Visit (INDEPENDENT_AMBULATORY_CARE_PROVIDER_SITE_OTHER)
Admission: RE | Admit: 2015-06-13 | Discharge: 2015-06-13 | Disposition: A | Discharge status: Home / Self Care | Payer: Medicare PPO | Source: Ambulatory Visit | Attending: Internal Medicine | Admitting: Internal Medicine

## 2015-06-13 DIAGNOSIS — Z5181 Encounter for therapeutic drug level monitoring: Secondary | ICD-10-CM

## 2015-06-13 DIAGNOSIS — I483 Typical atrial flutter: Secondary | ICD-10-CM | POA: Diagnosis not present

## 2015-06-13 DIAGNOSIS — I4892 Unspecified atrial flutter: Secondary | ICD-10-CM

## 2015-06-13 DIAGNOSIS — M80071S Age-related osteoporosis with current pathological fracture, right ankle and foot, sequela: Secondary | ICD-10-CM

## 2015-06-13 DIAGNOSIS — I495 Sick sinus syndrome: Secondary | ICD-10-CM | POA: Diagnosis not present

## 2015-06-13 LAB — POCT INR: INR: 3.3

## 2015-06-13 NOTE — Progress Notes (Signed)
Remote pacemaker transmission.   

## 2015-06-14 DIAGNOSIS — G4733 Obstructive sleep apnea (adult) (pediatric): Secondary | ICD-10-CM | POA: Diagnosis not present

## 2015-06-14 LAB — CUP PACEART REMOTE DEVICE CHECK
Brady Statistic AS VP Percent: 5 %
Implantable Lead Implant Date: 20131210
Implantable Lead Location: 753859
Implantable Lead Model: 4296
Implantable Lead Model: 5076
Lead Channel Pacing Threshold Amplitude: 0.625 V
Lead Channel Pacing Threshold Amplitude: 0.875 V
Lead Channel Pacing Threshold Pulse Width: 0.4 ms
Lead Channel Pacing Threshold Pulse Width: 0.4 ms
MDC IDC LEAD IMPLANT DT: 20131210
MDC IDC LEAD LOCATION: 753858
MDC IDC MSMT BATTERY IMPEDANCE: 209 Ohm
MDC IDC MSMT BATTERY REMAINING LONGEVITY: 108 mo
MDC IDC MSMT BATTERY VOLTAGE: 2.79 V
MDC IDC MSMT LEADCHNL RA IMPEDANCE VALUE: 466 Ohm
MDC IDC MSMT LEADCHNL RV IMPEDANCE VALUE: 882 Ohm
MDC IDC SESS DTM: 20161109135622
MDC IDC SET LEADCHNL RA PACING AMPLITUDE: 2 V
MDC IDC SET LEADCHNL RV PACING AMPLITUDE: 2.5 V
MDC IDC SET LEADCHNL RV PACING PULSEWIDTH: 0.4 ms
MDC IDC SET LEADCHNL RV SENSING SENSITIVITY: 8 mV
MDC IDC STAT BRADY AP VP PERCENT: 95 %
MDC IDC STAT BRADY AP VS PERCENT: 0 %
MDC IDC STAT BRADY AS VS PERCENT: 0 %

## 2015-06-15 ENCOUNTER — Encounter: Payer: Self-pay | Admitting: Cardiology

## 2015-06-18 ENCOUNTER — Other Ambulatory Visit: Payer: Self-pay | Admitting: Internal Medicine

## 2015-06-18 DIAGNOSIS — Z7951 Long term (current) use of inhaled steroids: Secondary | ICD-10-CM | POA: Insufficient documentation

## 2015-06-18 DIAGNOSIS — M8580 Other specified disorders of bone density and structure, unspecified site: Secondary | ICD-10-CM

## 2015-06-20 ENCOUNTER — Encounter: Payer: Self-pay | Admitting: Internal Medicine

## 2015-06-20 LAB — HM DEXA SCAN: HM DEXA SCAN: NORMAL

## 2015-07-04 ENCOUNTER — Ambulatory Visit (INDEPENDENT_AMBULATORY_CARE_PROVIDER_SITE_OTHER): Payer: Medicare PPO | Admitting: *Deleted

## 2015-07-04 DIAGNOSIS — Z5181 Encounter for therapeutic drug level monitoring: Secondary | ICD-10-CM

## 2015-07-04 DIAGNOSIS — I4892 Unspecified atrial flutter: Secondary | ICD-10-CM | POA: Diagnosis not present

## 2015-07-04 DIAGNOSIS — I483 Typical atrial flutter: Secondary | ICD-10-CM

## 2015-07-04 LAB — POCT INR: INR: 2.6

## 2015-07-09 ENCOUNTER — Other Ambulatory Visit: Payer: Self-pay

## 2015-07-09 ENCOUNTER — Encounter: Payer: Self-pay | Admitting: Gastroenterology

## 2015-07-09 DIAGNOSIS — I5032 Chronic diastolic (congestive) heart failure: Secondary | ICD-10-CM

## 2015-07-09 DIAGNOSIS — I110 Hypertensive heart disease with heart failure: Secondary | ICD-10-CM

## 2015-07-09 DIAGNOSIS — I422 Other hypertrophic cardiomyopathy: Secondary | ICD-10-CM

## 2015-07-09 MED ORDER — SPIRONOLACTONE 25 MG PO TABS: 25.0000 mg | ORAL_TABLET | ORAL | Status: DC

## 2015-07-09 MED ORDER — ALLOPURINOL 100 MG PO TABS: 100.0000 mg | ORAL_TABLET | Freq: Every day | ORAL | Status: DC

## 2015-07-09 MED ORDER — LINAGLIPTIN 5 MG PO TABS: 5.0000 mg | ORAL_TABLET | Freq: Every day | ORAL | Status: DC

## 2015-07-13 ENCOUNTER — Telehealth: Payer: Self-pay | Admitting: Pulmonary Disease

## 2015-07-13 NOTE — Telephone Encounter (Signed)
Spoke with pt, brought in card for download.  Download taken from card, placed in VS' look at.  Card returned to pt.  Pt requesting results of download as they're available.    VS please advise on recs. Thanks!

## 2015-07-14 DIAGNOSIS — G4733 Obstructive sleep apnea (adult) (pediatric): Secondary | ICD-10-CM | POA: Diagnosis not present

## 2015-07-17 NOTE — Telephone Encounter (Signed)
lmtcb X1 for pt  

## 2015-07-17 NOTE — Telephone Encounter (Signed)
Auto CPAP 06/14/15 to 07/12/15 >> used on 28 of 29 nights with average 8 hrs and 2 min.  Average AHI is 1.6 with median CPAP 8 cm H2O and 95 th percentile CPAP 12 cm H20.   Will have my nurse inform pt that CPAP report looked great.  Device is working well to control sleep apnea.

## 2015-07-18 ENCOUNTER — Other Ambulatory Visit: Payer: Self-pay | Admitting: Pulmonary Disease

## 2015-07-18 MED ORDER — ARMODAFINIL 150 MG PO TABS: 150.0000 mg | ORAL_TABLET | Freq: Every day | ORAL | Status: DC | PRN

## 2015-07-18 NOTE — Telephone Encounter (Signed)
I spoke with patient about results and she verbalized understanding and had no questions 

## 2015-08-01 ENCOUNTER — Ambulatory Visit (INDEPENDENT_AMBULATORY_CARE_PROVIDER_SITE_OTHER): Payer: Medicare PPO | Admitting: Pharmacist

## 2015-08-01 DIAGNOSIS — I4892 Unspecified atrial flutter: Secondary | ICD-10-CM | POA: Diagnosis not present

## 2015-08-01 DIAGNOSIS — Z5181 Encounter for therapeutic drug level monitoring: Secondary | ICD-10-CM

## 2015-08-01 DIAGNOSIS — I483 Typical atrial flutter: Secondary | ICD-10-CM | POA: Diagnosis not present

## 2015-08-01 LAB — POCT INR: INR: 2.4

## 2015-08-25 LAB — HM DIABETES EYE EXAM

## 2015-08-29 ENCOUNTER — Ambulatory Visit (INDEPENDENT_AMBULATORY_CARE_PROVIDER_SITE_OTHER): Payer: Medicare Other | Admitting: Pharmacist

## 2015-08-29 DIAGNOSIS — I4892 Unspecified atrial flutter: Secondary | ICD-10-CM

## 2015-08-29 DIAGNOSIS — I483 Typical atrial flutter: Secondary | ICD-10-CM

## 2015-08-29 DIAGNOSIS — Z5181 Encounter for therapeutic drug level monitoring: Secondary | ICD-10-CM | POA: Diagnosis not present

## 2015-08-29 LAB — POCT INR: INR: 2.9

## 2015-09-02 ENCOUNTER — Other Ambulatory Visit: Payer: Self-pay | Admitting: Internal Medicine

## 2015-09-03 ENCOUNTER — Telehealth: Payer: Self-pay

## 2015-09-03 NOTE — Telephone Encounter (Signed)
PA initiated via CoverMyMeds [place] [id number] ID [id number]

## 2015-09-05 NOTE — Telephone Encounter (Signed)
Spoke with pharmacist AJ at [facility] and was advised that "an additional day needs to be added to monthly supply".  Rx was approved, pharmacy will fill and notify pt

## 2015-09-26 ENCOUNTER — Ambulatory Visit (INDEPENDENT_AMBULATORY_CARE_PROVIDER_SITE_OTHER): Payer: Medicare Other | Admitting: Internal Medicine

## 2015-09-26 ENCOUNTER — Ambulatory Visit (INDEPENDENT_AMBULATORY_CARE_PROVIDER_SITE_OTHER): Payer: Medicare Other | Admitting: Pharmacist

## 2015-09-26 ENCOUNTER — Encounter: Payer: Self-pay | Admitting: Internal Medicine

## 2015-09-26 VITALS — BP 128/62 | HR 88 | Ht 63.0 in | Wt 179.0 lb

## 2015-09-26 DIAGNOSIS — I483 Typical atrial flutter: Secondary | ICD-10-CM

## 2015-09-26 DIAGNOSIS — I442 Atrioventricular block, complete: Secondary | ICD-10-CM | POA: Diagnosis not present

## 2015-09-26 DIAGNOSIS — Z950 Presence of cardiac pacemaker: Secondary | ICD-10-CM

## 2015-09-26 DIAGNOSIS — Z5181 Encounter for therapeutic drug level monitoring: Secondary | ICD-10-CM | POA: Diagnosis not present

## 2015-09-26 DIAGNOSIS — I421 Obstructive hypertrophic cardiomyopathy: Secondary | ICD-10-CM | POA: Diagnosis not present

## 2015-09-26 DIAGNOSIS — I4892 Unspecified atrial flutter: Secondary | ICD-10-CM

## 2015-09-26 DIAGNOSIS — I480 Paroxysmal atrial fibrillation: Secondary | ICD-10-CM

## 2015-09-26 LAB — CUP PACEART INCLINIC DEVICE CHECK
Battery Voltage: 2.79 V
Brady Statistic AP VS Percent: 0 %
Date Time Interrogation Session: 20170222145121
Implantable Lead Implant Date: 20131210
Implantable Lead Location: 753858
Implantable Lead Model: 4296
Lead Channel Pacing Threshold Amplitude: 0.75 V
Lead Channel Pacing Threshold Pulse Width: 0.4 ms
Lead Channel Setting Pacing Amplitude: 2 V
MDC IDC LEAD IMPLANT DT: 20131210
MDC IDC LEAD LOCATION: 753859
MDC IDC MSMT BATTERY IMPEDANCE: 209 Ohm
MDC IDC MSMT BATTERY REMAINING LONGEVITY: 112 mo
MDC IDC MSMT LEADCHNL RA IMPEDANCE VALUE: 494 Ohm
MDC IDC MSMT LEADCHNL RA SENSING INTR AMPL: 4 mV
MDC IDC MSMT LEADCHNL RV IMPEDANCE VALUE: 1028 Ohm
MDC IDC MSMT LEADCHNL RV SENSING INTR AMPL: 15.67 mV
MDC IDC SET LEADCHNL RV PACING AMPLITUDE: 2.5 V
MDC IDC SET LEADCHNL RV PACING PULSEWIDTH: 0.4 ms
MDC IDC SET LEADCHNL RV SENSING SENSITIVITY: 8 mV
MDC IDC STAT BRADY AP VP PERCENT: 80 %
MDC IDC STAT BRADY AS VP PERCENT: 20 %
MDC IDC STAT BRADY AS VS PERCENT: 0 %

## 2015-09-26 LAB — POCT INR: INR: 2.5

## 2015-09-26 NOTE — Progress Notes (Signed)
Electrophysiology Office Note   Date:  09/26/2015   ID:  Holly, Simmons 01/25/1941, MRN [id number]  PCP:  Holly Calico, MD  Cardiologist:  Dr Johnsie Cancel Primary Electrophysiologist: Holly Grayer, MD    Chief Complaint  Patient presents with  . Atrial Fibrillation     History of Present Illness: Holly Simmons is a 75 y.o. female who presents today for electrophysiology evaluation.    She is currently doing well.  IN afib today but completely unaware.  Has been in afib since October.  Today, she denies symptoms of palpitations, chest pain, shortness of breath, orthopnea, PND, lower extremity edema, claudication, dizziness, presyncope, syncope, bleeding, or neurologic sequela. The patient is tolerating medications without difficulties and is otherwise without complaint today.  Occasional feels a "vibration" from her abdomen near her abdominal pacer site, though interrogation today does not reveal any issues with this device.   Past Medical History  Diagnosis Date  . Hypertension   . Hyperpotassemia   . Asthma     NOS w/ acute exacerbation  . Hypersomnia   . Depressive disorder   . Diastolic dysfunction   . Pyuria   . Renal insufficiency   . GERD (gastroesophageal reflux disease)   . COPD (chronic obstructive pulmonary disease) ([place])   . Obstructive sleep apnea     persistent daytime sleepiness despite cpap  . Allergic rhinitis   . Psoriasis   . Hypertrophic cardiomyopathy ([place])     s/p surgical myomectomy at [facility] 0000000 complicated by septal VSD post procedure requiring reoperation with patch closure and tricuspid valve replacement  . 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 Complete heart block ([facility])     requiring PPM (MDT) post surgical myomectomy at [facility],  leads are epicardial with abdominal implant, high ventricular threshold at implant  . Myocardial infarction ([place]) 2011  . Heart murmur   . 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 Gout 08/20/2012  . Colon  polyps     TUBULAR ADENOMAS AND HYPERPLASTIC  . DM (diabetes mellitus) ([place])   . Gallstones   . Kidney stones   . Blood transfusion without reported diagnosis   . Typical atrial flutter ([place]) 9/15   Past Surgical History  Procedure Laterality Date  . Appendectomy    . Cholecystectomy    . Tonsillectomy    . Cesarean section    . Abdominal hysterectomy    . Septal myomectomy for hypertrophic cm  06/23/11    by Dr Holly Simmons at [facility], complicated by septal VSD requiring patch repair and tricuspid valve replacement  . Tricuspid valve replacement  11/12    Medtronic 54mm Mosaic tissue valve  . Vsd repair  06/2011  . Pacemaker insertion  07/02/11    epicardial wires with abdominal implant at Holly Simmons 12/12,  high ventricular lead threshold at implant per Dr Westley Simmons  . Insert / replace / remove pacemaker  07/13/2012  . Colonoscopy    . Polypectomy    . Permanent pacemaker insertion N/A 07/13/2012    Procedure: PERMANENT PACEMAKER INSERTION;  Surgeon: Holly Grayer, MD;  Location: [facility] CATH LAB;  Service: Cardiovascular;  Laterality: N/A;  . Cardioversion N/A 08/01/2014    Procedure: CARDIOVERSION;  Surgeon: Thayer Headings, MD;  Location: [place];  Service: Cardiovascular;  Laterality: N/A;     Current Outpatient Prescriptions  Medication Sig Dispense Refill  . allopurinol (ZYLOPRIM) 100 MG tablet Take 1 tablet (100 mg total) by mouth daily. 90 tablet 2  .  ampicillin (PRINCIPEN) 500 MG capsule Take 500 mg by mouth daily as needed (for dental work only.).    Marland Kitchen Armodafinil 150 MG tablet Take 1 tablet (150 mg total) by mouth daily as needed (for driving). 30 tablet 2  . B Complex-C (B-COMPLEX WITH VITAMIN C) tablet Take 1 tablet by mouth daily.    . Betamethasone Valerate 0.12 % foam Apply 1 application topically daily as needed (for psoriasis).    . budesonide-formoterol (SYMBICORT) 80-4.5 MCG/ACT inhaler Inhale 2 puffs into the lungs 2 (two) times daily. 1 Inhaler 11  . buPROPion  (WELLBUTRIN XL) 150 MG 24 hr tablet Take 1 tablet (150 mg total) by mouth daily. 90 tablet 2  . Cholecalciferol 2000 UNITS TABS Take 1 tablet (2,000 Units total) by mouth daily. 90 tablet 3  . fexofenadine (ALLEGRA) 180 MG tablet Take 180 mg by mouth daily as needed for allergies.     . furosemide (LASIX) 40 MG tablet Take 1 tablet (40 mg total) by mouth every other day. 45 tablet 2  . hydrocortisone (ANUSOL-HC) 25 MG suppository Place 25 mg rectally daily as needed for hemorrhoids.    Marland Kitchen linagliptin (TRADJENTA) 5 MG TABS tablet Take 1 tablet (5 mg total) by mouth daily. 90 tablet 2  . metoprolol succinate (TOPROL-XL) 100 MG 24 hr tablet Take 1 tablet (100 mg total) by mouth daily. Take with or immediately following a meal. 90 tablet 3  . Multiple Vitamin (MULTIVITAMIN) tablet Take 1 tablet by mouth daily.    . ranitidine (ZANTAC) 300 MG tablet Take 1 tablet (300 mg total) by mouth at bedtime. 90 tablet 3  . spironolactone (ALDACTONE) 25 MG tablet Take 1 tablet (25 mg total) by mouth every other day. 45 tablet 2  . triamcinolone cream (KENALOG) 0.5 % Apply 1 application topically 3 (three) times daily as needed (itching).     . Vilazodone HCl (VIIBRYD) 40 MG TABS Take 1 tablet (40 mg total) by mouth daily. 90 tablet 2  . warfarin (COUMADIN) 2.5 MG tablet TAKE 1 TO 1 & 1/2 TABLET BY MOUTH DAILY 40 tablet 3  . [DISCONTINUED] Alum & Mag Hydroxide-Simeth (MAGIC MOUTHWASH) SOLN Take 5 mLs by mouth 3 (three) times daily. 14 days then stop      No current facility-administered medications for this visit.    Allergies:   Sulfonamide derivatives; Tetanus toxoids; Other; and Nsaids   Social History:  The patient  reports that she has never smoked. She has never used smokeless tobacco. She reports that she does not drink alcohol or use illicit drugs.   Family History:  The patient's family history includes Bladder Cancer in her father; Breast cancer in her mother and paternal aunt; Cancer in her father and  mother; Dementia in her maternal aunt, maternal uncle, and mother; Heart attack in her father; Heart disease in her maternal grandfather and paternal grandfather; Hypertension in her daughter and mother; Ovarian cancer in her maternal aunt; Pancreatic cancer in her cousin; Prostate cancer in her father; Varicose Veins in her father. There is no history of Colon cancer.    ROS:  Please see the history of present illness.   All other systems are reviewed and negative.    PHYSICAL EXAM: VS:  BP 128/62 mmHg  Pulse 88  Ht 5\' 3"  (1.6 m)  Wt 179 lb (81.194 kg)  BMI 31.72 kg/m2 , BMI Body mass index is 31.72 kg/(m^2). GEN: Well nourished, well developed, in no acute distress HEENT: normal Neck: no JVD,  carotid bruits, or masses Cardiac: RRR; no murmurs, rubs, or gallops,no edema  Respiratory:  clear to auscultation bilaterally, normal work of breathing GI: soft, nontender, nondistended, + BS MS: no deformity or atrophy Skin: warm and dry, device pocket is well healed Neuro:  Strength and sensation are intact Psych: euthymic mood, full affect  Device interrogation is reviewed today in detail.  See PaceArt for details.   Recent Labs: 09/27/2014: Magnesium 2.3 05/30/2015: ALT 11; BUN 28*; Creatinine, Ser 1.43*; Hemoglobin 12.4; Platelets 181.0; Potassium 4.0; Sodium 139; TSH 1.81    Lipid Panel     Component Value Date/Time   CHOL 135 05/24/2014 1404   TRIG 80.0 05/24/2014 1404   HDL 49.80 05/24/2014 1404   CHOLHDL 3 05/24/2014 1404   VLDL 16.0 05/24/2014 1404   LDLCALC 69 05/24/2014 1404     Wt Readings from Last 3 Encounters:  09/26/15 179 lb (81.194 kg)  05/30/15 184 lb (83.462 kg)  05/22/15 182 lb 6.4 oz (82.736 kg)    Other studies Reviewed: Additional studies/ records that were reviewed today include: Dr Mariana Arn note   ASSESSMENT AND PLAN:   1. atrial flutter/ afib She has returned to afib but is completely unaware at this time. As she did not receive clinical  improvement with cardioversion in the past, she would prefer rate control going forward.  Continue anticoagulation with coumadin.  She has valvular heart disease and therefore she is not an ideal candidate for NOAC therapy.  Given her h/o HCM and prior valvular surgery, I think that her longterm risks of atrial arrhythmias is high.  Long term anticoagulation would therefore not be unreasonable.     2. Complete heart block Normal pacemaker function  See Pace Art report.  3. Hypertensive cardiovascular disease Stable No change required today  Continue carelink remote transmissions Return to see me in 12 months unless further arrhythmias arise Follow-up with Dr Johnsie Cancel as scheduled.   Current medicines are reviewed at length with the patient today.   The patient does not have concerns regarding her medicines.  The following changes were made today:  none  Signed, Holly Grayer, MD  09/26/2015   [place] [address] [phone] (office) [phone] (fax)

## 2015-09-26 NOTE — Patient Instructions (Signed)
Medication Instructions:  Your physician recommends that you continue on your current medications as directed. Please refer to the Current Medication list given to you today.   Labwork: None ordered   Testing/Procedures: None ordered   Follow-Up: Your physician wants you to follow-up in: 12 months with Dr Rayann Heman Dennis Bast will receive a reminder letter in the mail two months in advance. If you don't receive a letter, please call our office to schedule the follow-up appointment.  Remote monitoring is used to monitor your Pacemaker  from home. This monitoring reduces the number of office visits required to check your device to one time per year. It allows [place] to keep an eye on the functioning of your device to ensure it is working properly. You are scheduled for a device check from home on 12/26/15. You may send your transmission at any time that day. If you have a wireless device, the transmission will be sent automatically. After your physician reviews your transmission, you will receive a postcard with your next transmission date.     Any Other Special Instructions Will Be Listed Below (If Applicable).     If you need a refill on your cardiac medications before your next appointment, please call your pharmacy.

## 2015-10-01 ENCOUNTER — Encounter: Payer: Self-pay | Admitting: Internal Medicine

## 2015-10-03 ENCOUNTER — Other Ambulatory Visit (INDEPENDENT_AMBULATORY_CARE_PROVIDER_SITE_OTHER): Payer: Medicare Other

## 2015-10-03 ENCOUNTER — Other Ambulatory Visit: Payer: Self-pay | Admitting: Internal Medicine

## 2015-10-03 ENCOUNTER — Encounter: Payer: Self-pay | Admitting: Internal Medicine

## 2015-10-03 ENCOUNTER — Ambulatory Visit (INDEPENDENT_AMBULATORY_CARE_PROVIDER_SITE_OTHER): Payer: Medicare Other | Admitting: Internal Medicine

## 2015-10-03 VITALS — BP 108/68 | HR 73 | Temp 98.3°F | Resp 16 | Ht 63.0 in | Wt 176.0 lb

## 2015-10-03 DIAGNOSIS — E0821 Diabetes mellitus due to underlying condition with diabetic nephropathy: Secondary | ICD-10-CM

## 2015-10-03 DIAGNOSIS — N259 Disorder resulting from impaired renal tubular function, unspecified: Secondary | ICD-10-CM | POA: Diagnosis not present

## 2015-10-03 DIAGNOSIS — E785 Hyperlipidemia, unspecified: Secondary | ICD-10-CM

## 2015-10-03 DIAGNOSIS — R310 Gross hematuria: Secondary | ICD-10-CM

## 2015-10-03 DIAGNOSIS — F418 Other specified anxiety disorders: Secondary | ICD-10-CM

## 2015-10-03 LAB — LIPID PANEL
CHOL/HDL RATIO: 3
Cholesterol: 137 mg/dL (ref 0–200)
HDL: 47.2 mg/dL (ref >39.00)
LDL CALC: 75 mg/dL (ref 0–99)
NONHDL: 89.84
TRIGLYCERIDES: 75 mg/dL (ref 0.0–149.0)
VLDL: 15 mg/dL (ref 0.0–40.0)

## 2015-10-03 LAB — BASIC METABOLIC PANEL
BUN: 32 mg/dL — AB (ref 6–23)
CALCIUM: 10.5 mg/dL (ref 8.4–10.5)
CO2: 31 mEq/L (ref 19–32)
CREATININE: 1.51 mg/dL — AB (ref 0.40–1.20)
Chloride: 99 mEq/L (ref 96–112)
GFR: 35.98 mL/min — AB (ref >60.00)
Glucose, Bld: 133 mg/dL — ABNORMAL HIGH (ref 70–99)
Potassium: 4 mEq/L (ref 3.5–5.1)
Sodium: 137 mEq/L (ref 135–145)

## 2015-10-03 LAB — URINALYSIS, ROUTINE W REFLEX MICROSCOPIC
Bilirubin Urine: NEGATIVE
HGB URINE DIPSTICK: NEGATIVE
Ketones, ur: NEGATIVE
LEUKOCYTES UA: NEGATIVE
NITRITE: NEGATIVE
Specific Gravity, Urine: >=1.03 — AB (ref 1.000–1.030)
TOTAL PROTEIN, URINE-UPE24: 30 — AB
UROBILINOGEN UA: 0.2 (ref 0.0–1.0)
Urine Glucose: NEGATIVE
pH: 5.5 (ref 5.0–8.0)

## 2015-10-03 LAB — CBC WITH DIFFERENTIAL/PLATELET
BASOS ABS: 0 10*3/uL (ref 0.0–0.1)
Basophils Relative: 0.3 % (ref 0.0–3.0)
EOS ABS: 0.2 10*3/uL (ref 0.0–0.7)
Eosinophils Relative: 2.3 % (ref 0.0–5.0)
HCT: 39.7 % (ref 36.0–46.0)
HEMOGLOBIN: 13 g/dL (ref 12.0–15.0)
Lymphocytes Relative: 12.4 % (ref 12.0–46.0)
Lymphs Abs: 0.9 10*3/uL (ref 0.7–4.0)
MCHC: 32.8 g/dL (ref 30.0–36.0)
MCV: 90.6 fl (ref 78.0–100.0)
MONO ABS: 0.7 10*3/uL (ref 0.1–1.0)
Monocytes Relative: 9.1 % (ref 3.0–12.0)
Neutro Abs: 5.5 10*3/uL (ref 1.4–7.7)
Neutrophils Relative %: 75.9 % (ref 43.0–77.0)
Platelets: 164 10*3/uL (ref 150.0–400.0)
RBC: 4.38 Mil/uL (ref 3.87–5.11)
RDW: 15.6 % — AB (ref 11.5–15.5)
WBC: 7.3 10*3/uL (ref 4.0–10.5)

## 2015-10-03 LAB — TSH: TSH: 1.4 u[IU]/mL (ref 0.35–4.50)

## 2015-10-03 LAB — HEMOGLOBIN A1C: Hgb A1c MFr Bld: 6.5 % (ref 4.6–6.5)

## 2015-10-03 NOTE — Patient Instructions (Signed)

## 2015-10-03 NOTE — Progress Notes (Signed)
Subjective:  Patient ID: Holly Simmons, female    DOB: 09/04/1940  Age: 75 y.o. MRN: [id number]  CC: Hypertension; Hyperlipidemia; Diabetes; and Depression   HPI Holly Simmons presents for a follow-up on multiple medical problems. She complains of worsening depression with irritability, crying spells, and feeling hopeless and helpless. She feels like she has gotten some improvement with Viibryd and she does not want to try another antidepressant. She is under quite a bit of stress with a very ill and demanding husband.  Her blood pressure has been very well controlled with spironolactone and metoprolol. She also tells me that her blood sugars been well controlled with Tradjenta.  Outpatient Prescriptions Prior to Visit  Medication Sig Dispense Refill  . allopurinol (ZYLOPRIM) 100 MG tablet Take 1 tablet (100 mg total) by mouth daily. 90 tablet 2  . ampicillin (PRINCIPEN) 500 MG capsule Take 500 mg by mouth daily as needed (for dental work only.).    Marland Kitchen Armodafinil 150 MG tablet Take 1 tablet (150 mg total) by mouth daily as needed (for driving). 30 tablet 2  . B Complex-C (B-COMPLEX WITH VITAMIN C) tablet Take 1 tablet by mouth daily.    . Betamethasone Valerate 0.12 % foam Apply 1 application topically daily as needed (for psoriasis).    . budesonide-formoterol (SYMBICORT) 80-4.5 MCG/ACT inhaler Inhale 2 puffs into the lungs 2 (two) times daily. 1 Inhaler 11  . Cholecalciferol 2000 UNITS TABS Take 1 tablet (2,000 Units total) by mouth daily. 90 tablet 3  . fexofenadine (ALLEGRA) 180 MG tablet Take 180 mg by mouth daily as needed for allergies.     . furosemide (LASIX) 40 MG tablet Take 1 tablet (40 mg total) by mouth every other day. 45 tablet 2  . hydrocortisone (ANUSOL-HC) 25 MG suppository Place 25 mg rectally daily as needed for hemorrhoids.    Marland Kitchen linagliptin (TRADJENTA) 5 MG TABS tablet Take 1 tablet (5 mg total) by mouth daily. 90 tablet 2  . metoprolol succinate  (TOPROL-XL) 100 MG 24 hr tablet Take 1 tablet (100 mg total) by mouth daily. Take with or immediately following a meal. 90 tablet 3  . Multiple Vitamin (MULTIVITAMIN) tablet Take 1 tablet by mouth daily.    . ranitidine (ZANTAC) 300 MG tablet Take 1 tablet (300 mg total) by mouth at bedtime. 90 tablet 3  . spironolactone (ALDACTONE) 25 MG tablet Take 1 tablet (25 mg total) by mouth every other day. 45 tablet 2  . triamcinolone cream (KENALOG) 0.5 % Apply 1 application topically 3 (three) times daily as needed (itching).     . Vilazodone HCl (VIIBRYD) 40 MG TABS Take 1 tablet (40 mg total) by mouth daily. 90 tablet 2  . warfarin (COUMADIN) 2.5 MG tablet TAKE 1 TO 1 & 1/2 TABLET BY MOUTH DAILY 40 tablet 3  . buPROPion (WELLBUTRIN XL) 150 MG 24 hr tablet Take 1 tablet (150 mg total) by mouth daily. 90 tablet 2   No facility-administered medications prior to visit.    ROS Review of Systems  Constitutional: Positive for fatigue. Negative for chills, diaphoresis, appetite change and unexpected weight change.  HENT: Negative.  Negative for congestion, sinus pressure, sore throat and trouble swallowing.   Eyes: Negative.  Negative for photophobia and visual disturbance.  Respiratory: Negative.  Negative for cough, choking, chest tightness, shortness of breath and stridor.   Cardiovascular: Negative.  Negative for chest pain, palpitations and leg swelling.  Gastrointestinal: Negative.  Negative for nausea, vomiting, abdominal  pain, diarrhea, constipation and anal bleeding.  Endocrine: Negative.   Genitourinary: Negative.  Negative for dysuria, frequency, hematuria and difficulty urinating.  Musculoskeletal: Negative.  Negative for myalgias, back pain, arthralgias and neck pain.  Skin: Negative.  Negative for color change and rash.  Neurological: Negative.   Hematological: Negative.  Negative for adenopathy. Does not bruise/bleed easily.  Psychiatric/Behavioral: Positive for dysphoric mood. Negative  for suicidal ideas, confusion, sleep disturbance, decreased concentration and agitation. The patient is nervous/anxious.     Objective:  BP 108/68 mmHg  Pulse 73  Temp(Src) 98.3 F (36.8 C) (Oral)  Resp 16  Ht 5\' 3"  (1.6 m)  Wt 176 lb (79.833 kg)  BMI 31.18 kg/m2  SpO2 96%  BP Readings from Last 3 Encounters:  10/03/15 108/68  09/26/15 128/62  05/30/15 138/70    Wt Readings from Last 3 Encounters:  10/03/15 176 lb (79.833 kg)  09/26/15 179 lb (81.194 kg)  05/30/15 184 lb (83.462 kg)    Physical Exam  Constitutional: She is oriented to person, place, and time. No distress.  HENT:  Mouth/Throat: Oropharynx is clear and moist. No oropharyngeal exudate.  Eyes: Conjunctivae are normal. Right eye exhibits no discharge. Left eye exhibits no discharge. No scleral icterus.  Neck: Normal range of motion. Neck supple. No JVD present. No tracheal deviation present. No thyromegaly present.  Cardiovascular: Normal rate, regular rhythm, S1 normal, S2 normal and intact distal pulses.  Exam reveals no gallop and no friction rub.   Murmur heard.  Systolic murmur is present with a grade of 1/6   No diastolic murmur is present  Pulmonary/Chest: Effort normal and breath sounds normal. No stridor. No respiratory distress. She has no wheezes. She has no rales. She exhibits no tenderness.  Abdominal: Soft. Bowel sounds are normal. She exhibits no distension and no mass. There is no tenderness. There is no rebound and no guarding.  Musculoskeletal: Normal range of motion. She exhibits no edema or tenderness.  Lymphadenopathy:    She has no cervical adenopathy.  Neurological: She is oriented to person, place, and time.  Skin: Skin is warm and dry. No rash noted. She is not diaphoretic. No erythema. No pallor.  Psychiatric: Her behavior is normal. Judgment and thought content normal. Her mood appears not anxious. Her affect is not angry. Her speech is not rapid and/or pressured. She is not agitated  and not withdrawn. Cognition and memory are normal. She exhibits a depressed mood. She expresses no homicidal and no suicidal ideation. She expresses no suicidal plans and no homicidal plans. She is attentive.  Vitals reviewed.   Lab Results  Component Value Date   WBC 7.3 10/03/2015   HGB 13.0 10/03/2015   HCT 39.7 10/03/2015   PLT 164.0 10/03/2015   GLUCOSE 133* 10/03/2015   CHOL 137 10/03/2015   TRIG 75.0 10/03/2015   HDL 47.20 10/03/2015   LDLCALC 75 10/03/2015   ALT 11 05/30/2015   AST 18 05/30/2015   NA 137 10/03/2015   K 4.0 10/03/2015   CL 99 10/03/2015   CREATININE 1.51* 10/03/2015   BUN 32* 10/03/2015   CO2 31 10/03/2015   TSH 1.40 10/03/2015   INR 2.5 09/26/2015   HGBA1C 6.5 10/03/2015   MICROALBUR 21.1* 05/30/2015    Dg Bone Density  06/20/2015  Date of study: 06/23/2015 Exam: DUAL X-RAY ABSORPTIOMETRY (DXA) FOR BONE MINERAL DENSITY (BMD) Instrument: [employer] Requesting Provider: PCP Indication: Follow-up for osteoporosis Comparison: 09/22/2012 Clinical data: Pt is a postmenopausal 75  y.o. female with history of foot fracture. On calcium and vitamin D. Results:  Lumbar spine L1-L3 (L4) Femoral neck (FN) T-score  +0.5  RFN:-0.4 LFN:-0.8 Change in BMD from previous DXA test (%)  +1.4%   -4.2%* (*) statistically significant Assessment: the BMD is normal according to the [facility] classification for osteoporosis (see below). Fracture risk: low FRAX score: not calculated due to normal BMD Comments: the technical quality of the study is good, however, L4 vertebra has to be excluded from analysis. Recommend optimizing calcium (1200 mg/day) and vitamin D (800 IU/day) intake. Followup: Repeat BMD is appropriate after 2 years. WHO criteria for diagnosis of osteoporosis in postmenopausal women and in men 66 y/o or older: - normal: T-score -1.0 to + 1.0 - osteopenia/low bone density: T-score between -2.5 and -1.0 - osteoporosis: T-score below -2.5 - severe osteoporosis: T-score  below -2.5 with history of fragility fracture Note: although not part of the WHO classification, the presence of a fragility fracture, regardless of the T-score, should be considered diagnostic of osteoporosis, provided other causes for the fracture have been excluded. Philemon Kingdom, MD [facility]    Assessment & Plan:   Kenley was seen today for hypertension, hyperlipidemia, diabetes and depression.  Diagnoses and all orders for this visit:  Diabetes mellitus due to underlying condition with diabetic nephropathy, without long-term current use of insulin ([place])- her blood sugars are well-controlled, will continue the current treatment with Tradjenta -     Basic metabolic panel; Future -     CBC with Differential/Platelet; Future -     Hemoglobin A1c; Future  Hyperlipidemia with target LDL less than 100- she has achieved her LDL goal is doing well on the statin -     Lipid panel; Future -     CBC with Differential/Platelet; Future -     TSH; Future  Gross hematuria- this has resolved -     CBC with Differential/Platelet; Future -     Urinalysis, Routine w reflex microscopic (not at [facility]); Future  Disorder resulting from impaired renal function- her renal function is stable, she will continue to avoid nephrotoxic agents, will continue to maintain good blood pressure and blood sugar control. -     CBC with Differential/Platelet; Future  Depression with anxiety- I've asked her to start psychotherapy -     Ambulatory referral to Psychology  I am having Ms. Reek maintain her fexofenadine, multivitamin, B-complex with vitamin C, hydrocortisone, triamcinolone cream, ranitidine, budesonide-formoterol, metoprolol succinate, furosemide, ampicillin, Betamethasone Valerate, Vilazodone HCl, Cholecalciferol, linagliptin, allopurinol, spironolactone, Armodafinil, and warfarin.  No orders of the defined types were placed in this encounter.     Follow-up: Return in about 4 months  (around 02/02/2016).  Scarlette Calico, MD

## 2015-10-03 NOTE — Progress Notes (Signed)
Pre visit review using our clinic review tool, if applicable. No additional management support is needed unless otherwise documented below in the visit note. 

## 2015-10-04 ENCOUNTER — Telehealth: Payer: Self-pay | Admitting: Internal Medicine

## 2015-10-04 NOTE — Telephone Encounter (Signed)
Pt and husband Holly Simmons came in yesterday for appointments. Afterwards, they were scheduled for physicals for their next visit in July. She came in today to be sure you wanted to see them for physicals since it didn't state it on their AVS. I did check and they have not had one in the past year.  She wanted me to double check with you.  Please advise

## 2015-10-04 NOTE — Telephone Encounter (Signed)
pts informed

## 2015-10-04 NOTE — Telephone Encounter (Signed)
Yes, please schedule physicals for both of them during that visit

## 2015-10-09 ENCOUNTER — Ambulatory Visit (INDEPENDENT_AMBULATORY_CARE_PROVIDER_SITE_OTHER): Payer: Medicare Other | Admitting: Pulmonary Disease

## 2015-10-09 ENCOUNTER — Encounter: Payer: Self-pay | Admitting: Pulmonary Disease

## 2015-10-09 VITALS — BP 112/68 | HR 78 | Ht 63.0 in | Wt 179.2 lb

## 2015-10-09 DIAGNOSIS — G4710 Hypersomnia, unspecified: Secondary | ICD-10-CM

## 2015-10-09 DIAGNOSIS — G4733 Obstructive sleep apnea (adult) (pediatric): Secondary | ICD-10-CM | POA: Diagnosis not present

## 2015-10-09 DIAGNOSIS — J300 Vasomotor rhinitis: Secondary | ICD-10-CM | POA: Diagnosis not present

## 2015-10-09 MED ORDER — ARMODAFINIL 150 MG PO TABS: 150.0000 mg | ORAL_TABLET | Freq: Every day | ORAL | Status: DC | PRN

## 2015-10-09 MED ORDER — IPRATROPIUM BROMIDE 0.03 % NA SOLN: 2.0000 | Freq: Three times a day (TID) | NASAL | Status: DC | PRN

## 2015-10-09 NOTE — Patient Instructions (Signed)
Follow up in 1 year.

## 2015-10-09 NOTE — Progress Notes (Signed)
Current Outpatient Prescriptions on File Prior to Visit  Medication Sig  . allopurinol (ZYLOPRIM) 100 MG tablet Take 1 tablet (100 mg total) by mouth daily.  Marland Kitchen ampicillin (PRINCIPEN) 500 MG capsule Take 500 mg by mouth daily as needed (for dental work only.).  Marland Kitchen B Complex-C (B-COMPLEX WITH VITAMIN C) tablet Take 1 tablet by mouth daily.  . Betamethasone Valerate 0.12 % foam Apply 1 application topically daily as needed (for psoriasis).  . budesonide-formoterol (SYMBICORT) 80-4.5 MCG/ACT inhaler Inhale 2 puffs into the lungs 2 (two) times daily.  Marland Kitchen buPROPion (WELLBUTRIN XL) 150 MG 24 hr tablet TAKE 1 TABLET (150 MG TOTAL) BY MOUTH DAILY.  Marland Kitchen Cholecalciferol 2000 UNITS TABS Take 1 tablet (2,000 Units total) by mouth daily.  . fexofenadine (ALLEGRA) 180 MG tablet Take 180 mg by mouth daily as needed for allergies.   . furosemide (LASIX) 40 MG tablet Take 1 tablet (40 mg total) by mouth every other day.  . hydrocortisone (ANUSOL-HC) 25 MG suppository Place 25 mg rectally daily as needed for hemorrhoids.  Marland Kitchen linagliptin (TRADJENTA) 5 MG TABS tablet Take 1 tablet (5 mg total) by mouth daily.  . metoprolol succinate (TOPROL-XL) 100 MG 24 hr tablet Take 1 tablet (100 mg total) by mouth daily. Take with or immediately following a meal.  . Multiple Vitamin (MULTIVITAMIN) tablet Take 1 tablet by mouth daily.  . ranitidine (ZANTAC) 300 MG tablet Take 1 tablet (300 mg total) by mouth at bedtime.  Marland Kitchen spironolactone (ALDACTONE) 25 MG tablet Take 1 tablet (25 mg total) by mouth every other day.  . triamcinolone cream (KENALOG) 0.5 % Apply 1 application topically 3 (three) times daily as needed (itching).   . Vilazodone HCl (VIIBRYD) 40 MG TABS Take 1 tablet (40 mg total) by mouth daily.  Marland Kitchen warfarin (COUMADIN) 2.5 MG tablet TAKE 1 TO 1 & 1/2 TABLET BY MOUTH DAILY  . [DISCONTINUED] Alum & Mag Hydroxide-Simeth (MAGIC MOUTHWASH) SOLN Take 5 mLs by mouth 3 (three) times daily. 14 days then stop    No current  facility-administered medications on file prior to visit.     Chief Complaint  Patient presents with  . Follow-up    Wears CPAP nightly. Denies problems with mask/pressure. Reports at times mask leaks. DME: [facility]; Requests printed Rx for Nuvigil     Tests PSG 06/05/07 >> AHI 6, SpO2 low 77% Echo 09/13/14 >> EF 55 to 60% HST 05/15/15 >> AHI 29, SaO2 low 70% Auto CPAP 07/10/15 to 10/07/15 >> used on 85 of 90 nights with average 8 hrs 17 min.  Average AHI 1.5 with median CPAP 8 cm H2O and 95 th percentile CPAP 12 cm H2O.  Past medical history HTN, Diastolic CHF, s/p Tricuspid valve replacement, CHB s/p PM, A flutter, Depression, CKD, GERD, Psoriasis, Gout, Asthma  Past surgical hx, Medications, Allergies, Family hx, Social hx all reviewed.  Vital Signs BP 112/68 mmHg  Pulse 78  Ht 5\' 3"  (1.6 m)  Wt 179 lb 3.2 oz (81.285 kg)  BMI 31.75 kg/m2  SpO2 93%  History of Present Illness Holly Simmons is a 75 y.o. female with obstructive sleep apnea with persistent daytime hypersomnolence.  She has been using CPAP.  This helps.  She didn't use for one night and couldn't sleep.  She doesn't have any issues with mask fit.  She has noticed getting runny nose every since she had heart surgery.  This happens intermittently, and can last for a while.  Physical Exam  General -  No distress ENT - No sinus tenderness, no oral exudate, no LAN Cardiac - s1s2 regular, no murmur Chest - No wheeze/rales/dullness Back - No focal tenderness Abd - Soft, non-tender Ext - No edema Neuro - Normal strength Skin - No rashes Psych - normal mood, and behavior   Assessment/Plan  Obstructive sleep apnea. Plan: - continue auto CPAP  Persistent daytime hypersomnia. Plan: - refilled her script for nuvigil prn  Vasomotor rhinitis. Plan: - will have her try atrovent nasal spray prn   Patient Instructions  Follow up in 1 year     [name], MD [place] Pager:  [phone]

## 2015-10-10 ENCOUNTER — Ambulatory Visit (INDEPENDENT_AMBULATORY_CARE_PROVIDER_SITE_OTHER): Payer: 59 | Admitting: Licensed Clinical Social Worker

## 2015-10-10 DIAGNOSIS — F411 Generalized anxiety disorder: Secondary | ICD-10-CM | POA: Diagnosis not present

## 2015-10-10 DIAGNOSIS — F331 Major depressive disorder, recurrent, moderate: Secondary | ICD-10-CM | POA: Diagnosis not present

## 2015-10-24 ENCOUNTER — Ambulatory Visit (INDEPENDENT_AMBULATORY_CARE_PROVIDER_SITE_OTHER): Payer: 59 | Admitting: Licensed Clinical Social Worker

## 2015-10-24 DIAGNOSIS — F411 Generalized anxiety disorder: Secondary | ICD-10-CM | POA: Diagnosis not present

## 2015-10-24 DIAGNOSIS — F331 Major depressive disorder, recurrent, moderate: Secondary | ICD-10-CM

## 2015-10-31 ENCOUNTER — Ambulatory Visit (INDEPENDENT_AMBULATORY_CARE_PROVIDER_SITE_OTHER): Payer: Medicare Other | Admitting: *Deleted

## 2015-10-31 DIAGNOSIS — I483 Typical atrial flutter: Secondary | ICD-10-CM

## 2015-10-31 DIAGNOSIS — I4892 Unspecified atrial flutter: Secondary | ICD-10-CM

## 2015-10-31 DIAGNOSIS — Z5181 Encounter for therapeutic drug level monitoring: Secondary | ICD-10-CM | POA: Diagnosis not present

## 2015-10-31 LAB — POCT INR: INR: 2.5

## 2015-11-07 ENCOUNTER — Ambulatory Visit (INDEPENDENT_AMBULATORY_CARE_PROVIDER_SITE_OTHER): Payer: 59 | Admitting: Licensed Clinical Social Worker

## 2015-11-07 DIAGNOSIS — F411 Generalized anxiety disorder: Secondary | ICD-10-CM

## 2015-11-07 DIAGNOSIS — F331 Major depressive disorder, recurrent, moderate: Secondary | ICD-10-CM | POA: Diagnosis not present

## 2015-12-12 ENCOUNTER — Ambulatory Visit (INDEPENDENT_AMBULATORY_CARE_PROVIDER_SITE_OTHER): Payer: Medicare Other | Admitting: *Deleted

## 2015-12-12 DIAGNOSIS — I4892 Unspecified atrial flutter: Secondary | ICD-10-CM | POA: Diagnosis not present

## 2015-12-12 DIAGNOSIS — I483 Typical atrial flutter: Secondary | ICD-10-CM | POA: Diagnosis not present

## 2015-12-12 DIAGNOSIS — Z5181 Encounter for therapeutic drug level monitoring: Secondary | ICD-10-CM

## 2015-12-12 LAB — POCT INR: INR: 2.6

## 2015-12-26 ENCOUNTER — Ambulatory Visit (INDEPENDENT_AMBULATORY_CARE_PROVIDER_SITE_OTHER): Payer: Medicare Other | Admitting: *Deleted

## 2015-12-26 DIAGNOSIS — I442 Atrioventricular block, complete: Secondary | ICD-10-CM

## 2015-12-26 NOTE — Progress Notes (Signed)
Remote pacemaker transmission.   

## 2016-01-01 ENCOUNTER — Other Ambulatory Visit: Payer: Self-pay | Admitting: Pulmonary Disease

## 2016-01-01 ENCOUNTER — Other Ambulatory Visit: Payer: Self-pay | Admitting: Internal Medicine

## 2016-01-09 HISTORY — PX: EYE SURGERY: SHX253

## 2016-01-13 LAB — CUP PACEART REMOTE DEVICE CHECK
Battery Impedance: 234 Ohm
Brady Statistic AP VS Percent: 0 %
Brady Statistic AS VS Percent: 0 %
Implantable Lead Implant Date: 20131210
Implantable Lead Location: 753859
Implantable Lead Model: 5076
Lead Channel Impedance Value: 934 Ohm
Lead Channel Pacing Threshold Amplitude: 0.625 V
Lead Channel Pacing Threshold Pulse Width: 0.4 ms
Lead Channel Pacing Threshold Pulse Width: 0.4 ms
Lead Channel Setting Pacing Amplitude: 2.5 V
Lead Channel Setting Sensing Sensitivity: 8 mV
MDC IDC LEAD IMPLANT DT: 20131210
MDC IDC LEAD LOCATION: 753858
MDC IDC LEAD MODEL: 4296
MDC IDC MSMT BATTERY REMAINING LONGEVITY: 113 mo
MDC IDC MSMT BATTERY VOLTAGE: 2.79 V
MDC IDC MSMT LEADCHNL RA IMPEDANCE VALUE: 474 Ohm
MDC IDC MSMT LEADCHNL RA PACING THRESHOLD AMPLITUDE: 0.875 V
MDC IDC SESS DTM: 20170524104106
MDC IDC SET LEADCHNL RA PACING AMPLITUDE: 2 V
MDC IDC SET LEADCHNL RV PACING PULSEWIDTH: 0.4 ms
MDC IDC STAT BRADY AP VP PERCENT: 0 %
MDC IDC STAT BRADY AS VP PERCENT: 99 %

## 2016-01-16 HISTORY — PX: EYE SURGERY: SHX253

## 2016-01-22 ENCOUNTER — Encounter: Payer: Self-pay | Admitting: Cardiology

## 2016-01-23 ENCOUNTER — Ambulatory Visit (INDEPENDENT_AMBULATORY_CARE_PROVIDER_SITE_OTHER): Payer: Medicare Other | Admitting: *Deleted

## 2016-01-23 DIAGNOSIS — Z5181 Encounter for therapeutic drug level monitoring: Secondary | ICD-10-CM

## 2016-01-23 DIAGNOSIS — I4892 Unspecified atrial flutter: Secondary | ICD-10-CM

## 2016-01-23 DIAGNOSIS — I483 Typical atrial flutter: Secondary | ICD-10-CM

## 2016-01-23 LAB — POCT INR: INR: 2.3

## 2016-01-29 ENCOUNTER — Other Ambulatory Visit: Payer: Self-pay | Admitting: *Deleted

## 2016-01-29 ENCOUNTER — Other Ambulatory Visit: Payer: Self-pay | Admitting: Internal Medicine

## 2016-01-29 NOTE — Telephone Encounter (Signed)
Received fax from the pharmacy requesting a ninety day rx.

## 2016-01-30 ENCOUNTER — Ambulatory Visit (INDEPENDENT_AMBULATORY_CARE_PROVIDER_SITE_OTHER): Payer: Medicare Other | Admitting: Internal Medicine

## 2016-01-30 ENCOUNTER — Encounter: Payer: Medicare Other | Admitting: Internal Medicine

## 2016-01-30 ENCOUNTER — Encounter: Payer: Self-pay | Admitting: Internal Medicine

## 2016-01-30 ENCOUNTER — Other Ambulatory Visit (INDEPENDENT_AMBULATORY_CARE_PROVIDER_SITE_OTHER): Payer: Medicare Other

## 2016-01-30 VITALS — BP 150/72 | HR 84 | Temp 98.1°F | Resp 18 | Ht 63.0 in | Wt 183.0 lb

## 2016-01-30 DIAGNOSIS — E669 Obesity, unspecified: Secondary | ICD-10-CM

## 2016-01-30 DIAGNOSIS — R778 Other specified abnormalities of plasma proteins: Secondary | ICD-10-CM

## 2016-01-30 DIAGNOSIS — Z0000 Encounter for general adult medical examination without abnormal findings: Secondary | ICD-10-CM

## 2016-01-30 DIAGNOSIS — I5032 Chronic diastolic (congestive) heart failure: Secondary | ICD-10-CM | POA: Diagnosis not present

## 2016-01-30 DIAGNOSIS — E0821 Diabetes mellitus due to underlying condition with diabetic nephropathy: Secondary | ICD-10-CM

## 2016-01-30 LAB — COMPREHENSIVE METABOLIC PANEL
ALK PHOS: 151 U/L — AB (ref 39–117)
ALT: 11 U/L (ref 0–35)
AST: 18 U/L (ref 0–37)
Albumin: 4.3 g/dL (ref 3.5–5.2)
BILIRUBIN TOTAL: 0.6 mg/dL (ref 0.2–1.2)
BUN: 22 mg/dL (ref 6–23)
CALCIUM: 10.1 mg/dL (ref 8.4–10.5)
CO2: 31 mEq/L (ref 19–32)
CREATININE: 1.18 mg/dL (ref 0.40–1.20)
Chloride: 102 mEq/L (ref 96–112)
GFR: 47.78 mL/min — AB (ref >60.00)
GLUCOSE: 118 mg/dL — AB (ref 70–99)
Potassium: 3.7 mEq/L (ref 3.5–5.1)
Sodium: 139 mEq/L (ref 135–145)
TOTAL PROTEIN: 8 g/dL (ref 6.0–8.3)

## 2016-01-30 LAB — POCT GLYCOSYLATED HEMOGLOBIN (HGB A1C): Hemoglobin A1C: 6.3

## 2016-01-30 NOTE — Progress Notes (Signed)
Subjective:  Patient ID: Holly Simmons, female    DOB: 1941-03-18  Age: 75 y.o. MRN: [id number]  CC: Annual Exam and Diabetes   HPI Holly Simmons presents for a CPX.  She is due for follow-up on diabetes. She is tolerating Tradjenta well. She denies polyuria, polydipsia, or polyphagia. She does not routinely monitor her blood sugar at home. She feels well today and offers no complaints.    Past Medical History  Diagnosis Date  . Hypertension   . Hyperpotassemia   . Asthma     NOS w/ acute exacerbation  . Hypersomnia   . Depressive disorder   . Diastolic dysfunction   . Pyuria   . Renal insufficiency   . GERD (gastroesophageal reflux disease)   . COPD (chronic obstructive pulmonary disease) ([place])   . Obstructive sleep apnea     persistent daytime sleepiness despite cpap  . Allergic rhinitis   . Psoriasis   . Hypertrophic cardiomyopathy ([place])     s/p surgical myomectomy at [facility] 0000000 complicated by septal VSD post procedure requiring reoperation with patch closure and tricuspid valve replacement  . 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 Complete heart block (HCC)     requiring PPM (MDT) post surgical myomectomy at [facility],  leads are epicardial with abdominal implant, high ventricular threshold at implant  . Myocardial infarction ([place]) 2011  . Heart murmur   . 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 Gout 08/20/2012  . Colon polyps     TUBULAR ADENOMAS AND HYPERPLASTIC  . DM (diabetes mellitus) ([place])   . Gallstones   . Kidney stones   . Blood transfusion without reported diagnosis   . Typical atrial flutter ([place]) 9/15   Past Surgical History  Procedure Laterality Date  . Appendectomy    . Cholecystectomy    . Tonsillectomy    . Cesarean section    . Abdominal hysterectomy    . Septal myomectomy for hypertrophic cm  06/23/11    by Dr Evelina Dun at [facility], complicated by septal VSD requiring patch repair and tricuspid valve replacement  . Tricuspid valve  replacement  11/12    Medtronic 67mm Mosaic tissue valve  . Vsd repair  06/2011  . Pacemaker insertion  07/02/11    epicardial wires with abdominal implant at [place] 12/12,  high ventricular lead threshold at implant per Dr Westley Gambles  . Insert / replace / remove pacemaker  07/13/2012  . Colonoscopy    . Polypectomy    . Permanent pacemaker insertion N/A 07/13/2012    Procedure: PERMANENT PACEMAKER INSERTION;  Surgeon: Thompson Grayer, MD;  Location: [facility] CATH LAB;  Service: Cardiovascular;  Laterality: N/A;  . Cardioversion N/A 08/01/2014    Procedure: CARDIOVERSION;  Surgeon: Thayer Headings, MD;  Location: [place];  Service: Cardiovascular;  Laterality: N/A;  . Eye surgery  [id number]    left eye lens implant  . Eye surgery  [id number]    right eye lens implant    reports that she has never smoked. She has never used smokeless tobacco. She reports that she does not drink alcohol or use illicit drugs. family history includes Bladder Cancer in her father; Breast cancer in her mother and paternal aunt; Cancer in her father and mother; Dementia in her maternal aunt, maternal uncle, and mother; Heart attack in her father; Heart disease in her maternal grandfather and paternal grandfather; Hypertension in her daughter and mother; Ovarian cancer in her maternal aunt; Pancreatic cancer in  her cousin; Prostate cancer in her father; Varicose Veins in her father. There is no history of Colon cancer. Allergies  Allergen Reactions  . Sulfonamide Derivatives Anaphylaxis and Swelling  . Tetanus Toxoids Swelling  . Other Rash    STERI - STRIPS   . Nsaids Other (See Comments)    REACTION: proteinuria    Outpatient Prescriptions Prior to Visit  Medication Sig Dispense Refill  . allopurinol (ZYLOPRIM) 100 MG tablet Take 1 tablet (100 mg total) by mouth daily. 90 tablet 2  . ampicillin (PRINCIPEN) 500 MG capsule Take 500 mg by mouth daily as needed (for dental work only.).    Marland Kitchen Armodafinil 150 MG tablet Take  1 tablet (150 mg total) by mouth daily as needed (for driving). 30 tablet 5  . B Complex-C (B-COMPLEX WITH VITAMIN C) tablet Take 1 tablet by mouth daily.    . Betamethasone Valerate 0.12 % foam Apply 1 application topically daily as needed (for psoriasis).    . budesonide-formoterol (SYMBICORT) 80-4.5 MCG/ACT inhaler Inhale 2 puffs into the lungs 2 (two) times daily. 1 Inhaler 11  . buPROPion (WELLBUTRIN XL) 150 MG 24 hr tablet TAKE 1 TABLET (150 MG TOTAL) BY MOUTH DAILY. 90 tablet 2  . Cholecalciferol 2000 UNITS TABS Take 1 tablet (2,000 Units total) by mouth daily. 90 tablet 3  . fexofenadine (ALLEGRA) 180 MG tablet Take 180 mg by mouth daily as needed for allergies.     . furosemide (LASIX) 40 MG tablet Take 1 tablet (40 mg total) by mouth every other day. 45 tablet 2  . hydrocortisone (ANUSOL-HC) 25 MG suppository Place 25 mg rectally daily as needed for hemorrhoids.    Marland Kitchen ipratropium (ATROVENT) 0.03 % nasal spray PLACE 2 SPRAYS INTO THE NOSE 3 TIMES DAILY AS NEEDED FOR RHINITIS. 30 mL 1  . linagliptin (TRADJENTA) 5 MG TABS tablet Take 1 tablet (5 mg total) by mouth daily. 90 tablet 2  . metoprolol succinate (TOPROL-XL) 100 MG 24 hr tablet Take 1 tablet (100 mg total) by mouth daily. Take with or immediately following a meal. 90 tablet 3  . Multiple Vitamin (MULTIVITAMIN) tablet Take 1 tablet by mouth daily.    . ranitidine (ZANTAC) 300 MG tablet TAKE 1 TABLET (300 MG TOTAL) BY MOUTH AT BEDTIME. 90 tablet 2  . spironolactone (ALDACTONE) 25 MG tablet Take 1 tablet (25 mg total) by mouth every other day. 45 tablet 2  . triamcinolone cream (KENALOG) 0.5 % Apply 1 application topically 3 (three) times daily as needed (itching).     Marland Kitchen VIIBRYD 40 MG TABS TAKE 1 TABLET (40 MG TOTAL) BY MOUTH DAILY. 90 tablet 2  . warfarin (COUMADIN) 2.5 MG tablet Take as directed by coumadin clinic 40 tablet 3   No facility-administered medications prior to visit.    ROS Review of Systems  Constitutional:  Negative.  Negative for activity change, appetite change, fatigue and unexpected weight change.  HENT: Negative.  Negative for sinus pressure and trouble swallowing.   Eyes: Negative.  Negative for visual disturbance.  Respiratory: Negative.  Negative for cough, choking, chest tightness, shortness of breath and stridor.   Cardiovascular: Negative.  Negative for chest pain, palpitations and leg swelling.  Gastrointestinal: Negative.  Negative for nausea, vomiting, abdominal pain, diarrhea, constipation and blood in stool.  Endocrine: Negative.   Genitourinary: Negative.  Negative for dysuria, urgency, frequency, hematuria and difficulty urinating.  Musculoskeletal: Negative.  Negative for myalgias, back pain, arthralgias and neck pain.  Skin: Negative.  Negative  for color change and rash.  Allergic/Immunologic: Negative.   Neurological: Negative.   Hematological: Negative.  Negative for adenopathy. Does not bruise/bleed easily.  Psychiatric/Behavioral: Negative.     Objective:  BP 150/72 mmHg  Pulse 84  Temp(Src) 98.1 F (36.7 C) (Oral)  Resp 18  Ht 5\' 3"  (1.6 m)  Wt 183 lb (83.008 kg)  BMI 32.43 kg/m2  SpO2 96%  BP Readings from Last 3 Encounters:  01/30/16 150/72  10/09/15 112/68  10/03/15 108/68    Wt Readings from Last 3 Encounters:  01/30/16 183 lb (83.008 kg)  10/09/15 179 lb 3.2 oz (81.285 kg)  10/03/15 176 lb (79.833 kg)    Physical Exam  Constitutional: She is oriented to person, place, and time. No distress.  HENT:  Mouth/Throat: Oropharynx is clear and moist. No oropharyngeal exudate.  Eyes: Conjunctivae are normal. Right eye exhibits no discharge. Left eye exhibits no discharge. No scleral icterus.  Neck: Normal range of motion. Neck supple. No JVD present. No tracheal deviation present. No thyromegaly present.  Cardiovascular: Normal rate, regular rhythm, normal heart sounds and intact distal pulses.  Exam reveals no gallop and no friction rub.   No murmur  heard. Pulmonary/Chest: Effort normal and breath sounds normal. No stridor. No respiratory distress. She has no wheezes. She has no rales. She exhibits no tenderness.  Abdominal: Soft. Bowel sounds are normal. She exhibits no distension and no mass. There is no tenderness. There is no rebound and no guarding.  Genitourinary:  Breast, GU, and rectal exams were deferred at her request.  Musculoskeletal: Normal range of motion. She exhibits no edema or tenderness.  Lymphadenopathy:    She has no cervical adenopathy.  Neurological: She is oriented to person, place, and time.  Skin: Skin is warm and dry. No rash noted. She is not diaphoretic. No erythema. No pallor.  Psychiatric: She has a normal mood and affect. Her behavior is normal. Judgment and thought content normal.  Vitals reviewed.   Lab Results  Component Value Date   WBC 7.3 10/03/2015   HGB 13.0 10/03/2015   HCT 39.7 10/03/2015   PLT 164.0 10/03/2015   GLUCOSE 118* 01/30/2016   CHOL 137 10/03/2015   TRIG 75.0 10/03/2015   HDL 47.20 10/03/2015   LDLCALC 75 10/03/2015   ALT 11 01/30/2016   AST 18 01/30/2016   NA 139 01/30/2016   K 3.7 01/30/2016   CL 102 01/30/2016   CREATININE 1.18 01/30/2016   BUN 22 01/30/2016   CO2 31 01/30/2016   TSH 1.40 10/03/2015   INR 2.3 01/23/2016   HGBA1C 6.3 01/30/2016   MICROALBUR 21.1* 05/30/2015    Dg Bone Density  06/20/2015  Date of study: 06/23/2015 Exam: DUAL X-RAY ABSORPTIOMETRY (DXA) FOR BONE MINERAL DENSITY (BMD) Instrument: [employer] Requesting Provider: PCP Indication: Follow-up for osteoporosis Comparison: 09/22/2012 Clinical data: Pt is a postmenopausal 75 y.o. female with history of foot fracture. On calcium and vitamin D. Results:  Lumbar spine L1-L3 (L4) Femoral neck (FN) T-score  +0.5  RFN:-0.4 LFN:-0.8 Change in BMD from previous DXA test (%)  +1.4%   -4.2%* (*) statistically significant Assessment: the BMD is normal according to the [facility] classification for  osteoporosis (see below). Fracture risk: low FRAX score: not calculated due to normal BMD Comments: the technical quality of the study is good, however, L4 vertebra has to be excluded from analysis. Recommend optimizing calcium (1200 mg/day) and vitamin D (800 IU/day) intake. Followup: Repeat BMD is appropriate after 2  years. WHO criteria for diagnosis of osteoporosis in postmenopausal women and in men 30 y/o or older: - normal: T-score -1.0 to + 1.0 - osteopenia/low bone density: T-score between -2.5 and -1.0 - osteoporosis: T-score below -2.5 - severe osteoporosis: T-score below -2.5 with history of fragility fracture Note: although not part of the WHO classification, the presence of a fragility fracture, regardless of the T-score, should be considered diagnostic of osteoporosis, provided other causes for the fracture have been excluded. Philemon Kingdom, MD [facility]    Assessment & Plan:   Holly Simmons was seen today for annual exam and diabetes.  Diagnoses and all orders for this visit:  Diabetes mellitus due to underlying condition with diabetic nephropathy, without long-term current use of insulin ([place])- her A1c is 6.3%, her blood sugars are well controlled -     POCT HgB A1C -     Comprehensive metabolic panel; Future  Elevated total protein- her TP level is normal now, will cont to follow for now -     Comprehensive metabolic panel; Future  Chronic diastolic heart failure ([place])- her volume status is normal and she has no concerning symptoms, will continue spironolactone -     Comprehensive metabolic panel; Future  Routine general medical examination at a health care facility  Obesity (BMI 30.0-34.9)- she agrees to decrease her caloric intake to help her lose weight.   I am having Holly Simmons maintain her fexofenadine, multivitamin, B-complex with vitamin C, hydrocortisone, triamcinolone cream, budesonide-formoterol, metoprolol succinate, furosemide, ampicillin,  Betamethasone Valerate, Cholecalciferol, linagliptin, allopurinol, spironolactone, buPROPion, Armodafinil, ipratropium, ranitidine, VIIBRYD, and warfarin.  No orders of the defined types were placed in this encounter.   See AVS for instructions about healthy living and anticipatory guidance.  Follow-up: Return in about 4 months (around 05/31/2016).  Scarlette Calico, MD

## 2016-01-30 NOTE — Progress Notes (Signed)
Pre visit review using our clinic review tool, if applicable. No additional management support is needed unless otherwise documented below in the visit note. 

## 2016-01-30 NOTE — Patient Instructions (Signed)
Preventive Care for Adults, Female A healthy lifestyle and preventive care can promote health and wellness. Preventive health guidelines for women include the following key practices.  A routine yearly physical is a good way to check with your health care provider about your health and preventive screening. It is a chance to share any concerns and updates on your health and to receive a thorough exam.  Visit your dentist for a routine exam and preventive care every 6 months. Brush your teeth twice a day and floss once a day. Good oral hygiene prevents tooth decay and gum disease.  The frequency of eye exams is based on your age, health, family medical history, use of contact lenses, and other factors. Follow your health care provider's recommendations for frequency of eye exams.  Eat a healthy diet. Foods like vegetables, fruits, whole grains, low-fat dairy products, and lean protein foods contain the nutrients you need without too many calories. Decrease your intake of foods high in solid fats, added sugars, and salt. Eat the right amount of calories for you.Get information about a proper diet from your health care provider, if necessary.  Regular physical exercise is one of the most important things you can do for your health. Most adults should get at least 150 minutes of moderate-intensity exercise (any activity that increases your heart rate and causes you to sweat) each week. In addition, most adults need muscle-strengthening exercises on 2 or more days a week.  Maintain a healthy weight. The body mass index (BMI) is a screening tool to identify possible weight problems. It provides an estimate of body fat based on height and weight. Your health care provider can find your BMI and can help you achieve or maintain a healthy weight.For adults 20 years and older:  A BMI below 18.5 is considered underweight.  A BMI of 18.5 to 24.9 is normal.  A BMI of 25 to 29.9 is considered overweight.  A  BMI of 30 and above is considered obese.  Maintain normal blood lipids and cholesterol levels by exercising and minimizing your intake of saturated fat. Eat a balanced diet with plenty of fruit and vegetables. Blood tests for lipids and cholesterol should begin at age 45 and be repeated every 5 years. If your lipid or cholesterol levels are high, you are over 50, or you are at high risk for heart disease, you may need your cholesterol levels checked more frequently.Ongoing high lipid and cholesterol levels should be treated with medicines if diet and exercise are not working.  If you smoke, find out from your health care provider how to quit. If you do not use tobacco, do not start.  Lung cancer screening is recommended for adults aged 45-80 years who are at high risk for developing lung cancer because of a history of smoking. A yearly low-dose CT scan of the lungs is recommended for people who have at least a 30-pack-year history of smoking and are a current smoker or have quit within the past 15 years. A pack year of smoking is smoking an average of 1 pack of cigarettes a day for 1 year (for example: 1 pack a day for 30 years or 2 packs a day for 15 years). Yearly screening should continue until the smoker has stopped smoking for at least 15 years. Yearly screening should be stopped for people who develop a health problem that would prevent them from having lung cancer treatment.  If you are pregnant, do not drink alcohol. If you are  breastfeeding, be very cautious about drinking alcohol. If you are not pregnant and choose to drink alcohol, do not have more than 1 drink per day. One drink is considered to be 12 ounces (355 mL) of beer, 5 ounces (148 mL) of wine, or 1.5 ounces (44 mL) of liquor.  Avoid use of street drugs. Do not share needles with anyone. Ask for help if you need support or instructions about stopping the use of drugs.  High blood pressure causes heart disease and increases the risk  of stroke. Your blood pressure should be checked at least every 1 to 2 years. Ongoing high blood pressure should be treated with medicines if weight loss and exercise do not work.  If you are 55-79 years old, ask your health care provider if you should take aspirin to prevent strokes.  Diabetes screening is done by taking a blood sample to check your blood glucose level after you have not eaten for a certain period of time (fasting). If you are not overweight and you do not have risk factors for diabetes, you should be screened once every 3 years starting at age 45. If you are overweight or obese and you are 40-70 years of age, you should be screened for diabetes every year as part of your cardiovascular risk assessment.  Breast cancer screening is essential preventive care for women. You should practice "breast self-awareness." This means understanding the normal appearance and feel of your breasts and may include breast self-examination. Any changes detected, no matter how small, should be reported to a health care provider. Women in their 20s and 30s should have a clinical breast exam (CBE) by a health care provider as part of a regular health exam every 1 to 3 years. After age 40, women should have a CBE every year. Starting at age 40, women should consider having a mammogram (breast X-ray test) every year. Women who have a family history of breast cancer should talk to their health care provider about genetic screening. Women at a high risk of breast cancer should talk to their health care providers about having an MRI and a mammogram every year.  Breast cancer gene (BRCA)-related cancer risk assessment is recommended for women who have family members with BRCA-related cancers. BRCA-related cancers include breast, ovarian, tubal, and peritoneal cancers. Having family members with these cancers may be associated with an increased risk for harmful changes (mutations) in the breast cancer genes BRCA1 and  BRCA2. Results of the assessment will determine the need for genetic counseling and BRCA1 and BRCA2 testing.  Your health care provider may recommend that you be screened regularly for cancer of the pelvic organs (ovaries, uterus, and vagina). This screening involves a pelvic examination, including checking for microscopic changes to the surface of your cervix (Pap test). You may be encouraged to have this screening done every 3 years, beginning at age 21.  For women ages 30-65, health care providers may recommend pelvic exams and Pap testing every 3 years, or they may recommend the Pap and pelvic exam, combined with testing for human papilloma virus (HPV), every 5 years. Some types of HPV increase your risk of cervical cancer. Testing for HPV may also be done on women of any age with unclear Pap test results.  Other health care providers may not recommend any screening for nonpregnant women who are considered low risk for pelvic cancer and who do not have symptoms. Ask your health care provider if a screening pelvic exam is right for   you.  If you have had past treatment for cervical cancer or a condition that could lead to cancer, you need Pap tests and screening for cancer for at least 20 years after your treatment. If Pap tests have been discontinued, your risk factors (such as having a new sexual partner) need to be reassessed to determine if screening should resume. Some women have medical problems that increase the chance of getting cervical cancer. In these cases, your health care provider may recommend more frequent screening and Pap tests.  Colorectal cancer can be detected and often prevented. Most routine colorectal cancer screening begins at the age of 50 years and continues through age 75 years. However, your health care provider may recommend screening at an earlier age if you have risk factors for colon cancer. On a yearly basis, your health care provider may provide home test kits to check  for hidden blood in the stool. Use of a small camera at the end of a tube, to directly examine the colon (sigmoidoscopy or colonoscopy), can detect the earliest forms of colorectal cancer. Talk to your health care provider about this at age 50, when routine screening begins. Direct exam of the colon should be repeated every 5-10 years through age 75 years, unless early forms of precancerous polyps or small growths are found.  People who are at an increased risk for hepatitis B should be screened for this virus. You are considered at high risk for hepatitis B if:  You were born in a country where hepatitis B occurs often. Talk with your health care provider about which countries are considered high risk.  Your parents were born in a high-risk country and you have not received a shot to protect against hepatitis B (hepatitis B vaccine).  You have HIV or AIDS.  You use needles to inject street drugs.  You live with, or have sex with, someone who has hepatitis B.  You get hemodialysis treatment.  You take certain medicines for conditions like cancer, organ transplantation, and autoimmune conditions.  Hepatitis C blood testing is recommended for all people born from 1945 through 1965 and any individual with known risks for hepatitis C.  Practice safe sex. Use condoms and avoid high-risk sexual practices to reduce the spread of sexually transmitted infections (STIs). STIs include gonorrhea, chlamydia, syphilis, trichomonas, herpes, HPV, and human immunodeficiency virus (HIV). Herpes, HIV, and HPV are viral illnesses that have no cure. They can result in disability, cancer, and death.  You should be screened for sexually transmitted illnesses (STIs) including gonorrhea and chlamydia if:  You are sexually active and are younger than 24 years.  You are older than 24 years and your health care provider tells you that you are at risk for this type of infection.  Your sexual activity has changed  since you were last screened and you are at an increased risk for chlamydia or gonorrhea. Ask your health care provider if you are at risk.  If you are at risk of being infected with HIV, it is recommended that you take a prescription medicine daily to prevent HIV infection. This is called preexposure prophylaxis (PrEP). You are considered at risk if:  You are sexually active and do not regularly use condoms or know the HIV status of your partner(s).  You take drugs by injection.  You are sexually active with a partner who has HIV.  Talk with your health care provider about whether you are at high risk of being infected with HIV. If   you choose to begin PrEP, you should first be tested for HIV. You should then be tested every 3 months for as long as you are taking PrEP.  Osteoporosis is a disease in which the bones lose minerals and strength with aging. This can result in serious bone fractures or breaks. The risk of osteoporosis can be identified using a bone density scan. Women ages 67 years and over and women at risk for fractures or osteoporosis should discuss screening with their health care providers. Ask your health care provider whether you should take a calcium supplement or vitamin D to reduce the rate of osteoporosis.  Menopause can be associated with physical symptoms and risks. Hormone replacement therapy is available to decrease symptoms and risks. You should talk to your health care provider about whether hormone replacement therapy is right for you.  Use sunscreen. Apply sunscreen liberally and repeatedly throughout the day. You should seek shade when your shadow is shorter than you. Protect yourself by wearing long sleeves, pants, a wide-brimmed hat, and sunglasses year round, whenever you are outdoors.  Once a month, do a whole body skin exam, using a mirror to look at the skin on your back. Tell your health care provider of new moles, moles that have irregular borders, moles that  are larger than a pencil eraser, or moles that have changed in shape or color.  Stay current with required vaccines (immunizations).  Influenza vaccine. All adults should be immunized every year.  Tetanus, diphtheria, and acellular pertussis (Td, Tdap) vaccine. Pregnant women should receive 1 dose of Tdap vaccine during each pregnancy. The dose should be obtained regardless of the length of time since the last dose. Immunization is preferred during the 27th-36th week of gestation. An adult who has not previously received Tdap or who does not know her vaccine status should receive 1 dose of Tdap. This initial dose should be followed by tetanus and diphtheria toxoids (Td) booster doses every 10 years. Adults with an unknown or incomplete history of completing a 3-dose immunization series with Td-containing vaccines should begin or complete a primary immunization series including a Tdap dose. Adults should receive a Td booster every 10 years.  Varicella vaccine. An adult without evidence of immunity to varicella should receive 2 doses or a second dose if she has previously received 1 dose. Pregnant females who do not have evidence of immunity should receive the first dose after pregnancy. This first dose should be obtained before leaving the health care facility. The second dose should be obtained 4-8 weeks after the first dose.  Human papillomavirus (HPV) vaccine. Females aged 13-26 years who have not received the vaccine previously should obtain the 3-dose series. The vaccine is not recommended for use in pregnant females. However, pregnancy testing is not needed before receiving a dose. If a female is found to be pregnant after receiving a dose, no treatment is needed. In that case, the remaining doses should be delayed until after the pregnancy. Immunization is recommended for any person with an immunocompromised condition through the age of 61 years if she did not get any or all doses earlier. During the  3-dose series, the second dose should be obtained 4-8 weeks after the first dose. The third dose should be obtained 24 weeks after the first dose and 16 weeks after the second dose.  Zoster vaccine. One dose is recommended for adults aged 30 years or older unless certain conditions are present.  Measles, mumps, and rubella (MMR) vaccine. Adults born  before 1957 generally are considered immune to measles and mumps. Adults born in 1957 or later should have 1 or more doses of MMR vaccine unless there is a contraindication to the vaccine or there is laboratory evidence of immunity to each of the three diseases. A routine second dose of MMR vaccine should be obtained at least 28 days after the first dose for students attending postsecondary schools, health care workers, or international travelers. People who received inactivated measles vaccine or an unknown type of measles vaccine during 1963-1967 should receive 2 doses of MMR vaccine. People who received inactivated mumps vaccine or an unknown type of mumps vaccine before 1979 and are at high risk for mumps infection should consider immunization with 2 doses of MMR vaccine. For females of childbearing age, rubella immunity should be determined. If there is no evidence of immunity, females who are not pregnant should be vaccinated. If there is no evidence of immunity, females who are pregnant should delay immunization until after pregnancy. Unvaccinated health care workers born before 1957 who lack laboratory evidence of measles, mumps, or rubella immunity or laboratory confirmation of disease should consider measles and mumps immunization with 2 doses of MMR vaccine or rubella immunization with 1 dose of MMR vaccine.  Pneumococcal 13-valent conjugate (PCV13) vaccine. When indicated, a person who is uncertain of his immunization history and has no record of immunization should receive the PCV13 vaccine. All adults 65 years of age and older should receive this  vaccine. An adult aged 19 years or older who has certain medical conditions and has not been previously immunized should receive 1 dose of PCV13 vaccine. This PCV13 should be followed with a dose of pneumococcal polysaccharide (PPSV23) vaccine. Adults who are at high risk for pneumococcal disease should obtain the PPSV23 vaccine at least 8 weeks after the dose of PCV13 vaccine. Adults older than 73 years of age who have normal immune system function should obtain the PPSV23 vaccine dose at least 1 year after the dose of PCV13 vaccine.  Pneumococcal polysaccharide (PPSV23) vaccine. When PCV13 is also indicated, PCV13 should be obtained first. All adults aged 65 years and older should be immunized. An adult younger than age 65 years who has certain medical conditions should be immunized. Any person who resides in a nursing home or long-term care facility should be immunized. An adult smoker should be immunized. People with an immunocompromised condition and certain other conditions should receive both PCV13 and PPSV23 vaccines. People with human immunodeficiency virus (HIV) infection should be immunized as soon as possible after diagnosis. Immunization during chemotherapy or radiation therapy should be avoided. Routine use of PPSV23 vaccine is not recommended for American Indians, [place] Natives, or people younger than 65 years unless there are medical conditions that require PPSV23 vaccine. When indicated, people who have unknown immunization and have no record of immunization should receive PPSV23 vaccine. One-time revaccination 5 years after the first dose of PPSV23 is recommended for people aged 19-64 years who have chronic kidney failure, nephrotic syndrome, asplenia, or immunocompromised conditions. People who received 1-2 doses of PPSV23 before age 65 years should receive another dose of PPSV23 vaccine at age 65 years or later if at least 5 years have passed since the previous dose. Doses of PPSV23 are not  needed for people immunized with PPSV23 at or after age 65 years.  Meningococcal vaccine. Adults with asplenia or persistent complement component deficiencies should receive 2 doses of quadrivalent meningococcal conjugate (MenACWY-D) vaccine. The doses should be obtained   at least 2 months apart. Microbiologists working with certain meningococcal bacteria, [place] recruits, people at risk during an outbreak, and people who travel to or live in countries with a high rate of meningitis should be immunized. A first-year college student up through age 34 years who is living in a residence hall should receive a dose if she did not receive a dose on or after her 16th birthday. Adults who have certain high-risk conditions should receive one or more doses of vaccine.  Hepatitis A vaccine. Adults who wish to be protected from this disease, have certain high-risk conditions, work with hepatitis A-infected animals, work in hepatitis A research labs, or travel to or work in countries with a high rate of hepatitis A should be immunized. Adults who were previously unvaccinated and who anticipate close contact with an international adoptee during the first 60 days after arrival in the [place] from a country with a high rate of hepatitis A should be immunized.  Hepatitis B vaccine. Adults who wish to be protected from this disease, have certain high-risk conditions, may be exposed to blood or other infectious body fluids, are household contacts or sex partners of hepatitis B positive people, are clients or workers in certain care facilities, or travel to or work in countries with a high rate of hepatitis B should be immunized.  Haemophilus influenzae type b (Hib) vaccine. A previously unvaccinated person with asplenia or sickle cell disease or having a scheduled splenectomy should receive 1 dose of Hib vaccine. Regardless of previous immunization, a recipient of a hematopoietic stem cell transplant should receive a  3-dose series 6-12 months after her successful transplant. Hib vaccine is not recommended for adults with HIV infection. Preventive Services / Frequency Ages 35 to 4 years  Blood pressure check.** / Every 3-5 years.  Lipid and cholesterol check.** / Every 5 years beginning at age 60.  Clinical breast exam.** / Every 3 years for women in their 71s and 10s.  BRCA-related cancer risk assessment.** / For women who have family members with a BRCA-related cancer (breast, ovarian, tubal, or peritoneal cancers).  Pap test.** / Every 2 years from ages 76 through 26. Every 3 years starting at age 61 through age 76 or 93 with a history of 3 consecutive normal Pap tests.  HPV screening.** / Every 3 years from ages 37 through ages 60 to 51 with a history of 3 consecutive normal Pap tests.  Hepatitis C blood test.** / For any individual with known risks for hepatitis C.  Skin self-exam. / Monthly.  Influenza vaccine. / Every year.  Tetanus, diphtheria, and acellular pertussis (Tdap, Td) vaccine.** / Consult your health care provider. Pregnant women should receive 1 dose of Tdap vaccine during each pregnancy. 1 dose of Td every 10 years.  Varicella vaccine.** / Consult your health care provider. Pregnant females who do not have evidence of immunity should receive the first dose after pregnancy.  HPV vaccine. / 3 doses over 6 months, if 93 and younger. The vaccine is not recommended for use in pregnant females. However, pregnancy testing is not needed before receiving a dose.  Measles, mumps, rubella (MMR) vaccine.** / You need at least 1 dose of MMR if you were born in 1957 or later. You may also need a 2nd dose. For females of childbearing age, rubella immunity should be determined. If there is no evidence of immunity, females who are not pregnant should be vaccinated. If there is no evidence of immunity, females who are  pregnant should delay immunization until after pregnancy.  Pneumococcal  13-valent conjugate (PCV13) vaccine.** / Consult your health care provider.  Pneumococcal polysaccharide (PPSV23) vaccine.** / 1 to 2 doses if you smoke cigarettes or if you have certain conditions.  Meningococcal vaccine.** / 1 dose if you are age 68 to 8 years and a Market researcher living in a residence hall, or have one of several medical conditions, you need to get vaccinated against meningococcal disease. You may also need additional booster doses.  Hepatitis A vaccine.** / Consult your health care provider.  Hepatitis B vaccine.** / Consult your health care provider.  Haemophilus influenzae type b (Hib) vaccine.** / Consult your health care provider. Ages 7 to 53 years  Blood pressure check.** / Every year.  Lipid and cholesterol check.** / Every 5 years beginning at age 25 years.  Lung cancer screening. / Every year if you are aged 11-80 years and have a 30-pack-year history of smoking and currently smoke or have quit within the past 15 years. Yearly screening is stopped once you have quit smoking for at least 15 years or develop a health problem that would prevent you from having lung cancer treatment.  Clinical breast exam.** / Every year after age 48 years.  BRCA-related cancer risk assessment.** / For women who have family members with a BRCA-related cancer (breast, ovarian, tubal, or peritoneal cancers).  Mammogram.** / Every year beginning at age 41 years and continuing for as long as you are in good health. Consult with your health care provider.  Pap test.** / Every 3 years starting at age 65 years through age 37 or 70 years with a history of 3 consecutive normal Pap tests.  HPV screening.** / Every 3 years from ages 72 years through ages 60 to 40 years with a history of 3 consecutive normal Pap tests.  Fecal occult blood test (FOBT) of stool. / Every year beginning at age 21 years and continuing until age 5 years. You may not need to do this test if you get  a colonoscopy every 10 years.  Flexible sigmoidoscopy or colonoscopy.** / Every 5 years for a flexible sigmoidoscopy or every 10 years for a colonoscopy beginning at age 35 years and continuing until age 48 years.  Hepatitis C blood test.** / For all people born from [birth year] through 1965 and any individual with known risks for hepatitis C.  Skin self-exam. / Monthly.  Influenza vaccine. / Every year.  Tetanus, diphtheria, and acellular pertussis (Tdap/Td) vaccine.** / Consult your health care provider. Pregnant women should receive 1 dose of Tdap vaccine during each pregnancy. 1 dose of Td every 10 years.  Varicella vaccine.** / Consult your health care provider. Pregnant females who do not have evidence of immunity should receive the first dose after pregnancy.  Zoster vaccine.** / 1 dose for adults aged 30 years or older.  Measles, mumps, rubella (MMR) vaccine.** / You need at least 1 dose of MMR if you were born in 1957 or later. You may also need a second dose. For females of childbearing age, rubella immunity should be determined. If there is no evidence of immunity, females who are not pregnant should be vaccinated. If there is no evidence of immunity, females who are pregnant should delay immunization until after pregnancy.  Pneumococcal 13-valent conjugate (PCV13) vaccine.** / Consult your health care provider.  Pneumococcal polysaccharide (PPSV23) vaccine.** / 1 to 2 doses if you smoke cigarettes or if you have certain conditions.  Meningococcal vaccine.** /  Consult your health care provider.  Hepatitis A vaccine.** / Consult your health care provider.  Hepatitis B vaccine.** / Consult your health care provider.  Haemophilus influenzae type b (Hib) vaccine.** / Consult your health care provider. Ages 64 years and over  Blood pressure check.** / Every year.  Lipid and cholesterol check.** / Every 5 years beginning at age 23 years.  Lung cancer screening. / Every year if you  are aged 16-80 years and have a 30-pack-year history of smoking and currently smoke or have quit within the past 15 years. Yearly screening is stopped once you have quit smoking for at least 15 years or develop a health problem that would prevent you from having lung cancer treatment.  Clinical breast exam.** / Every year after age 74 years.  BRCA-related cancer risk assessment.** / For women who have family members with a BRCA-related cancer (breast, ovarian, tubal, or peritoneal cancers).  Mammogram.** / Every year beginning at age 44 years and continuing for as long as you are in good health. Consult with your health care provider.  Pap test.** / Every 3 years starting at age 58 years through age 22 or 39 years with 3 consecutive normal Pap tests. Testing can be stopped between 65 and 70 years with 3 consecutive normal Pap tests and no abnormal Pap or HPV tests in the past 10 years.  HPV screening.** / Every 3 years from ages 64 years through ages 70 or 61 years with a history of 3 consecutive normal Pap tests. Testing can be stopped between 65 and 70 years with 3 consecutive normal Pap tests and no abnormal Pap or HPV tests in the past 10 years.  Fecal occult blood test (FOBT) of stool. / Every year beginning at age 40 years and continuing until age 27 years. You may not need to do this test if you get a colonoscopy every 10 years.  Flexible sigmoidoscopy or colonoscopy.** / Every 5 years for a flexible sigmoidoscopy or every 10 years for a colonoscopy beginning at age 7 years and continuing until age 32 years.  Hepatitis C blood test.** / For all people born from [birth year] through 1963 and any individual with known risks for hepatitis C.  Osteoporosis screening.** / A one-time screening for women ages 30 years and over and women at risk for fractures or osteoporosis.  Skin self-exam. / Monthly.  Influenza vaccine. / Every year.  Tetanus, diphtheria, and acellular pertussis (Tdap/Td)  vaccine.** / 1 dose of Td every 10 years.  Varicella vaccine.** / Consult your health care provider.  Zoster vaccine.** / 1 dose for adults aged 35 years or older.  Pneumococcal 13-valent conjugate (PCV13) vaccine.** / Consult your health care provider.  Pneumococcal polysaccharide (PPSV23) vaccine.** / 1 dose for all adults aged 46 years and older.  Meningococcal vaccine.** / Consult your health care provider.  Hepatitis A vaccine.** / Consult your health care provider.  Hepatitis B vaccine.** / Consult your health care provider.  Haemophilus influenzae type b (Hib) vaccine.** / Consult your health care provider. ** Family history and personal history of risk and conditions may change your health care provider's recommendations.   This information is not intended to replace advice given to you by your health care provider. Make sure you discuss any questions you have with your health care provider.   Document Released: 09/16/2001 Document Revised: 08/11/2014 Document Reviewed: 12/16/2010 Elsevier Interactive Patient Education [employer].

## 2016-02-04 NOTE — Assessment & Plan Note (Signed)

## 2016-02-05 DIAGNOSIS — E669 Obesity, unspecified: Secondary | ICD-10-CM | POA: Insufficient documentation

## 2016-02-06 ENCOUNTER — Encounter: Payer: Medicare Other | Admitting: Internal Medicine

## 2016-02-25 ENCOUNTER — Other Ambulatory Visit: Payer: Self-pay | Admitting: Internal Medicine

## 2016-02-25 DIAGNOSIS — IMO0001 Reserved for inherently not codable concepts without codable children: Secondary | ICD-10-CM

## 2016-02-29 ENCOUNTER — Other Ambulatory Visit: Payer: Self-pay | Admitting: Internal Medicine

## 2016-02-29 DIAGNOSIS — IMO0001 Reserved for inherently not codable concepts without codable children: Secondary | ICD-10-CM

## 2016-03-05 ENCOUNTER — Other Ambulatory Visit: Payer: Self-pay | Admitting: Family Medicine

## 2016-03-05 ENCOUNTER — Ambulatory Visit (INDEPENDENT_AMBULATORY_CARE_PROVIDER_SITE_OTHER): Payer: Medicare Other | Admitting: *Deleted

## 2016-03-05 DIAGNOSIS — I483 Typical atrial flutter: Secondary | ICD-10-CM | POA: Diagnosis not present

## 2016-03-05 DIAGNOSIS — I4892 Unspecified atrial flutter: Secondary | ICD-10-CM

## 2016-03-05 DIAGNOSIS — Z5181 Encounter for therapeutic drug level monitoring: Secondary | ICD-10-CM

## 2016-03-05 DIAGNOSIS — N63 Unspecified lump in unspecified breast: Secondary | ICD-10-CM

## 2016-03-05 LAB — POCT INR: INR: 2.1

## 2016-03-12 ENCOUNTER — Encounter: Payer: Self-pay | Admitting: Cardiovascular Disease

## 2016-03-19 LAB — HM DIABETES EYE EXAM

## 2016-03-23 NOTE — Progress Notes (Signed)
Patient ID: Holly Simmons, female   DOB: [birth year], 75 y.o.   MRN: [id number]   She has a fairly complex history. She has a history of HOCM and underwent surgery at [facility] recently. She was seen in followup by Dr. Rayann Heman 11/15/11 . She underwent myomectomy at [facility] 06/23/11. 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 She underwent cardioversion for atrial flutter and was placed on Coumadin and amiodarone. Epicardial leads are known to have a high threshold.  Echocardiogram 08/27/11: Status post septal myomectomy complicated by VSD now with VSD patch repair-patch covers the basal septal wall and leads to akinesis of the wall within that area, small to moderate residual VSD at the apical end of the patch, EF 50%, moderate diastolic dysfunction, no LVOT or SAM, trivial MR, TR with mildly elevated mean gradient.  Maintaining sinus rhythm and her amiodarone and Coumadin were both discontinued.  Subsequently had new pacer placed 1/15 . Could not cross TVR for stable lead but got good threshholds in CS.   12/15 found to be in flutter on pacer interrogation  [facility] with DR Nahser  08/01/14   Coumadin Rx  CHADVASC 3   Reviewed echo from 09/13/14  and EF ok no LVOT gradient No residual VSD  Gradients across TV a bit high    Seen by [facility]  09/26/15 and pacer interrogation indicated afib since 05/2015  Rate control and anticoagulation Decided on since patient unaware of arrhythmia   ROS: Denies fever, malais, weight loss, blurry vision, decreased visual acuity, cough, sputum, SOB, hemoptysis, pleuritic pain, palpitaitons, heartburn, abdominal pain, melena, lower extremity edema, claudication, or rash.  All other systems reviewed and negative  General: Affect appropriate Healthy:  appears stated age 22: normal Neck supple with no  adenopathy JVP normal no bruits no thyromegaly Lungs clear with no wheezing and good diaphragmatic motion Heart:  S1/S2 no murmur, no rub, gallop or click PMI normal Abdomen: benighn, BS positve, no tenderness, no AAA  Epicardial pacer LLQ no bruit.  No HSM or HJR Distal pulses intact with no bruits Plus one  Edema  Lot of varicose veins bilaterally  Neuro non-focal Skin warm and dry No muscular weakness   Current Outpatient Prescriptions  Medication Sig Dispense Refill  . allopurinol (ZYLOPRIM) 100 MG tablet Take 1 tablet (100 mg total) by mouth daily. 90 tablet 2  . ampicillin (PRINCIPEN) 500 MG capsule Take 500 mg by mouth daily as needed (for dental work only.).    Marland Kitchen Armodafinil 150 MG tablet Take 1 tablet (150 mg total) by mouth daily as needed (for driving). 30 tablet 5  . B Complex-C (B-COMPLEX WITH VITAMIN C) tablet Take 1 tablet by mouth daily.    . Betamethasone Valerate 0.12 % foam Apply 1 application topically daily as needed (for psoriasis).    Marland Kitchen buPROPion (WELLBUTRIN XL) 150 MG 24 hr tablet TAKE 1 TABLET (150 MG TOTAL) BY MOUTH DAILY. 90 tablet 2  . Cholecalciferol 2000 UNITS TABS Take 1 tablet (2,000 Units total) by mouth daily. 90 tablet 3  . fexofenadine (ALLEGRA) 180 MG tablet Take 180 mg by mouth daily as needed for allergies.     . furosemide (LASIX) 40 MG tablet Take 1 tablet (40 mg total) by mouth every other day. 45 tablet 2  . hydrocortisone (ANUSOL-HC) 25 MG suppository Place 25  mg rectally daily as needed for hemorrhoids.    Marland Kitchen ipratropium (ATROVENT) 0.03 % nasal spray PLACE 2 SPRAYS INTO THE NOSE 3 TIMES DAILY AS NEEDED FOR RHINITIS. 30 mL 1  . linagliptin (TRADJENTA) 5 MG TABS tablet Take 1 tablet (5 mg total) by mouth daily. 90 tablet 2  . metoprolol succinate (TOPROL-XL) 100 MG 24 hr tablet Take 1 tablet (100 mg total) by mouth daily. Take with or immediately following a meal. 90 tablet 3  . Multiple Vitamin (MULTIVITAMIN) tablet Take 1 tablet by mouth daily.     . ranitidine (ZANTAC) 300 MG tablet TAKE 1 TABLET (300 MG TOTAL) BY MOUTH AT BEDTIME. 90 tablet 2  . spironolactone (ALDACTONE) 25 MG tablet Take 1 tablet (25 mg total) by mouth every other day. 45 tablet 2  . SYMBICORT 80-4.5 MCG/ACT inhaler INHALE 2 PUFFS INTO THE LUNGS 2 (TWO) TIMES DAILY. 10.2 Inhaler 11  . SYMBICORT 80-4.5 MCG/ACT inhaler INHALE 2 PUFFS INTO THE LUNGS 2 (TWO) TIMES DAILY. 10.2 Inhaler 10  . triamcinolone cream (KENALOG) 0.5 % Apply 1 application topically 3 (three) times daily as needed (itching).     Marland Kitchen VIIBRYD 40 MG TABS TAKE 1 TABLET (40 MG TOTAL) BY MOUTH DAILY. 90 tablet 2  . warfarin (COUMADIN) 2.5 MG tablet Take as directed by coumadin clinic 40 tablet 3   No current facility-administered medications for this visit.     Allergies  Sulfonamide derivatives; Tetanus toxoids; Other; and Nsaids  Electrocardiogram:  AV paced rate 79  8/15 12/23  Flutter rate 85  RBBB paced   Flutter with V pacing rate 60  RBBB pattern 03/26/16   Assessment and Plan HOCM: post myectomy with complication but most of LVOT gradient gone now VSD: no murmur last echo sealed post MI/myectomy complication Pacer  Thresholds ok f/u [facility] 6 months TV replacement  F/u echo in a year no diastolic murmur Anticoagulation  For PAF  No bleeding issues f/u coumadin clinic Afib:  Seen by Dr Rayann Heman  09/26/15 pacer indicated been in afib since October 2016 decided To rate control and anticoagulate since patient is unaware of arrhythmia.  Given valvular afib on coumadin and not NOAC  Looked at preliminary images of echo today and no changes VSD still appears closed Small gradient across TV mild MR/AR EF about 50% with mid and basal septal akinesis  Jenkins Rouge

## 2016-03-25 ENCOUNTER — Other Ambulatory Visit: Payer: Self-pay | Admitting: Internal Medicine

## 2016-03-25 DIAGNOSIS — N63 Unspecified lump in unspecified breast: Secondary | ICD-10-CM

## 2016-03-26 ENCOUNTER — Other Ambulatory Visit: Payer: Self-pay

## 2016-03-26 ENCOUNTER — Ambulatory Visit
Admission: RE | Admit: 2016-03-26 | Discharge: 2016-03-26 | Disposition: A | Discharge status: Home / Self Care | Payer: Medicare Other | Source: Ambulatory Visit | Attending: Family Medicine | Admitting: Family Medicine

## 2016-03-26 ENCOUNTER — Ambulatory Visit (INDEPENDENT_AMBULATORY_CARE_PROVIDER_SITE_OTHER): Payer: Medicare Other | Admitting: *Deleted

## 2016-03-26 ENCOUNTER — Ambulatory Visit (INDEPENDENT_AMBULATORY_CARE_PROVIDER_SITE_OTHER): Payer: Medicare Other | Admitting: Cardiovascular Disease

## 2016-03-26 ENCOUNTER — Other Ambulatory Visit: Payer: Self-pay | Admitting: Cardiovascular Disease

## 2016-03-26 ENCOUNTER — Encounter: Payer: Self-pay | Admitting: Cardiovascular Disease

## 2016-03-26 ENCOUNTER — Ambulatory Visit (HOSPITAL_COMMUNITY): Payer: Medicare Other | Attending: Cardiology

## 2016-03-26 VITALS — BP 110/66 | HR 75 | Ht 63.0 in | Wt 180.4 lb

## 2016-03-26 DIAGNOSIS — G4733 Obstructive sleep apnea (adult) (pediatric): Secondary | ICD-10-CM | POA: Diagnosis not present

## 2016-03-26 DIAGNOSIS — E119 Type 2 diabetes mellitus without complications: Secondary | ICD-10-CM | POA: Insufficient documentation

## 2016-03-26 DIAGNOSIS — I110 Hypertensive heart disease with heart failure: Secondary | ICD-10-CM | POA: Insufficient documentation

## 2016-03-26 DIAGNOSIS — I079 Rheumatic tricuspid valve disease, unspecified: Secondary | ICD-10-CM

## 2016-03-26 DIAGNOSIS — I340 Nonrheumatic mitral (valve) insufficiency: Secondary | ICD-10-CM | POA: Diagnosis not present

## 2016-03-26 DIAGNOSIS — Z953 Presence of xenogenic heart valve: Secondary | ICD-10-CM | POA: Insufficient documentation

## 2016-03-26 DIAGNOSIS — I442 Atrioventricular block, complete: Secondary | ICD-10-CM | POA: Diagnosis not present

## 2016-03-26 DIAGNOSIS — Z95810 Presence of automatic (implantable) cardiac defibrillator: Secondary | ICD-10-CM | POA: Diagnosis not present

## 2016-03-26 DIAGNOSIS — R29898 Other symptoms and signs involving the musculoskeletal system: Secondary | ICD-10-CM | POA: Insufficient documentation

## 2016-03-26 DIAGNOSIS — E785 Hyperlipidemia, unspecified: Secondary | ICD-10-CM | POA: Diagnosis not present

## 2016-03-26 DIAGNOSIS — I4892 Unspecified atrial flutter: Secondary | ICD-10-CM

## 2016-03-26 DIAGNOSIS — Z5181 Encounter for therapeutic drug level monitoring: Secondary | ICD-10-CM | POA: Diagnosis not present

## 2016-03-26 DIAGNOSIS — J449 Chronic obstructive pulmonary disease, unspecified: Secondary | ICD-10-CM | POA: Diagnosis not present

## 2016-03-26 DIAGNOSIS — I352 Nonrheumatic aortic (valve) stenosis with insufficiency: Secondary | ICD-10-CM | POA: Diagnosis not present

## 2016-03-26 DIAGNOSIS — I483 Typical atrial flutter: Secondary | ICD-10-CM

## 2016-03-26 DIAGNOSIS — I071 Rheumatic tricuspid insufficiency: Secondary | ICD-10-CM | POA: Diagnosis not present

## 2016-03-26 DIAGNOSIS — Z8249 Family history of ischemic heart disease and other diseases of the circulatory system: Secondary | ICD-10-CM | POA: Diagnosis not present

## 2016-03-26 DIAGNOSIS — I422 Other hypertrophic cardiomyopathy: Secondary | ICD-10-CM | POA: Diagnosis not present

## 2016-03-26 DIAGNOSIS — I509 Heart failure, unspecified: Secondary | ICD-10-CM | POA: Insufficient documentation

## 2016-03-26 DIAGNOSIS — I252 Old myocardial infarction: Secondary | ICD-10-CM | POA: Diagnosis not present

## 2016-03-26 DIAGNOSIS — N63 Unspecified lump in unspecified breast: Secondary | ICD-10-CM

## 2016-03-26 LAB — HM MAMMOGRAPHY

## 2016-03-26 LAB — POCT INR: INR: 2.6

## 2016-03-26 NOTE — Progress Notes (Signed)
Remote pacemaker transmission.   

## 2016-03-26 NOTE — Patient Instructions (Signed)

## 2016-03-29 ENCOUNTER — Other Ambulatory Visit: Payer: Self-pay | Admitting: Internal Medicine

## 2016-03-29 DIAGNOSIS — I110 Hypertensive heart disease with heart failure: Secondary | ICD-10-CM

## 2016-03-29 DIAGNOSIS — I5032 Chronic diastolic (congestive) heart failure: Secondary | ICD-10-CM

## 2016-03-29 DIAGNOSIS — I422 Other hypertrophic cardiomyopathy: Secondary | ICD-10-CM

## 2016-04-01 ENCOUNTER — Other Ambulatory Visit (HOSPITAL_COMMUNITY): Payer: Self-pay

## 2016-04-02 ENCOUNTER — Encounter: Payer: Self-pay | Admitting: Cardiology

## 2016-04-02 ENCOUNTER — Other Ambulatory Visit: Payer: Self-pay | Admitting: Internal Medicine

## 2016-04-02 DIAGNOSIS — I422 Other hypertrophic cardiomyopathy: Secondary | ICD-10-CM

## 2016-04-02 DIAGNOSIS — I110 Hypertensive heart disease with heart failure: Secondary | ICD-10-CM

## 2016-04-02 DIAGNOSIS — I5032 Chronic diastolic (congestive) heart failure: Secondary | ICD-10-CM

## 2016-04-08 LAB — CUP PACEART REMOTE DEVICE CHECK
Battery Remaining Longevity: 113 mo
Brady Statistic AP VP Percent: 1 %
Brady Statistic AS VP Percent: 99 %
Brady Statistic AS VS Percent: 0 %
Implantable Lead Implant Date: 20131210
Implantable Lead Location: 753858
Implantable Lead Location: 753859
Lead Channel Impedance Value: 473 Ohm
Lead Channel Impedance Value: 931 Ohm
Lead Channel Pacing Threshold Amplitude: 0.625 V
Lead Channel Pacing Threshold Amplitude: 0.875 V
Lead Channel Pacing Threshold Pulse Width: 0.4 ms
Lead Channel Setting Pacing Amplitude: 2 V
Lead Channel Setting Pacing Pulse Width: 0.4 ms
MDC IDC LEAD IMPLANT DT: 20131210
MDC IDC LEAD MODEL: 4296
MDC IDC MSMT BATTERY IMPEDANCE: 234 Ohm
MDC IDC MSMT BATTERY VOLTAGE: 2.79 V
MDC IDC MSMT LEADCHNL RA PACING THRESHOLD PULSEWIDTH: 0.4 ms
MDC IDC SESS DTM: 20170823141311
MDC IDC SET LEADCHNL RV PACING AMPLITUDE: 2.5 V
MDC IDC SET LEADCHNL RV SENSING SENSITIVITY: 8 mV
MDC IDC STAT BRADY AP VS PERCENT: 0 %

## 2016-04-15 ENCOUNTER — Ambulatory Visit (INDEPENDENT_AMBULATORY_CARE_PROVIDER_SITE_OTHER): Payer: Medicare Other

## 2016-04-15 DIAGNOSIS — Z23 Encounter for immunization: Secondary | ICD-10-CM | POA: Diagnosis not present

## 2016-05-07 ENCOUNTER — Ambulatory Visit (INDEPENDENT_AMBULATORY_CARE_PROVIDER_SITE_OTHER): Payer: Medicare Other | Admitting: General Practice

## 2016-05-07 ENCOUNTER — Ambulatory Visit: Payer: Self-pay

## 2016-05-07 ENCOUNTER — Other Ambulatory Visit: Payer: Self-pay | Admitting: General Practice

## 2016-05-07 DIAGNOSIS — I483 Typical atrial flutter: Secondary | ICD-10-CM | POA: Diagnosis not present

## 2016-05-07 DIAGNOSIS — Z5181 Encounter for therapeutic drug level monitoring: Secondary | ICD-10-CM

## 2016-05-07 DIAGNOSIS — I4892 Unspecified atrial flutter: Secondary | ICD-10-CM

## 2016-05-07 LAB — POCT INR: INR: 2.3

## 2016-06-01 ENCOUNTER — Other Ambulatory Visit: Payer: Self-pay | Admitting: Internal Medicine

## 2016-06-04 ENCOUNTER — Ambulatory Visit: Payer: Self-pay | Admitting: Internal Medicine

## 2016-06-04 ENCOUNTER — Encounter: Payer: Self-pay | Admitting: Internal Medicine

## 2016-06-04 ENCOUNTER — Other Ambulatory Visit (INDEPENDENT_AMBULATORY_CARE_PROVIDER_SITE_OTHER): Payer: Medicare Other

## 2016-06-04 ENCOUNTER — Ambulatory Visit (INDEPENDENT_AMBULATORY_CARE_PROVIDER_SITE_OTHER): Payer: Medicare Other | Admitting: Internal Medicine

## 2016-06-04 VITALS — BP 120/66 | HR 66 | Temp 97.8°F | Ht 63.0 in | Wt 179.5 lb

## 2016-06-04 DIAGNOSIS — R778 Other specified abnormalities of plasma proteins: Secondary | ICD-10-CM

## 2016-06-04 DIAGNOSIS — E0821 Diabetes mellitus due to underlying condition with diabetic nephropathy: Secondary | ICD-10-CM

## 2016-06-04 DIAGNOSIS — H9313 Tinnitus, bilateral: Secondary | ICD-10-CM | POA: Diagnosis not present

## 2016-06-04 DIAGNOSIS — N259 Disorder resulting from impaired renal tubular function, unspecified: Secondary | ICD-10-CM

## 2016-06-04 DIAGNOSIS — B354 Tinea corporis: Secondary | ICD-10-CM

## 2016-06-04 LAB — URINALYSIS, ROUTINE W REFLEX MICROSCOPIC
BILIRUBIN URINE: NEGATIVE
HGB URINE DIPSTICK: NEGATIVE
KETONES UR: NEGATIVE
NITRITE: NEGATIVE
SPECIFIC GRAVITY, URINE: 1.02 (ref 1.000–1.030)
Total Protein, Urine: 30 — AB
URINE GLUCOSE: NEGATIVE
Urobilinogen, UA: 0.2 (ref 0.0–1.0)
pH: 5.5 (ref 5.0–8.0)

## 2016-06-04 LAB — COMPREHENSIVE METABOLIC PANEL
ALT: 10 U/L (ref 0–35)
AST: 17 U/L (ref 0–37)
Albumin: 4.3 g/dL (ref 3.5–5.2)
Alkaline Phosphatase: 144 U/L — ABNORMAL HIGH (ref 39–117)
BUN: 23 mg/dL (ref 6–23)
CHLORIDE: 101 meq/L (ref 96–112)
CO2: 32 meq/L (ref 19–32)
CREATININE: 1.25 mg/dL — AB (ref 0.40–1.20)
Calcium: 10.5 mg/dL (ref 8.4–10.5)
GFR: 44.67 mL/min — ABNORMAL LOW (ref >60.00)
GLUCOSE: 74 mg/dL (ref 70–99)
POTASSIUM: 3.9 meq/L (ref 3.5–5.1)
SODIUM: 139 meq/L (ref 135–145)
Total Bilirubin: 0.8 mg/dL (ref 0.2–1.2)
Total Protein: 8 g/dL (ref 6.0–8.3)

## 2016-06-04 LAB — MICROALBUMIN / CREATININE URINE RATIO
CREATININE, U: 155.4 mg/dL
MICROALB UR: 22.8 mg/dL — AB (ref 0.0–1.9)
Microalb Creat Ratio: 14.7 mg/g (ref 0.0–30.0)

## 2016-06-04 LAB — HEMOGLOBIN A1C: Hgb A1c MFr Bld: 6.2 % (ref 4.6–6.5)

## 2016-06-04 MED ORDER — CICLOPIROX OLAMINE 0.77 % EX SUSP: 1.0000 | Freq: Two times a day (BID) | CUTANEOUS | 2 refills | Status: DC

## 2016-06-04 NOTE — Progress Notes (Signed)
Pre visit review using our clinic review tool, if applicable. No additional management support is needed unless otherwise documented below in the visit note. 

## 2016-06-04 NOTE — Patient Instructions (Signed)

## 2016-06-04 NOTE — Progress Notes (Signed)
Subjective:  Patient ID: Holly Simmons, female    DOB: Feb 22, 1941  Age: 75 y.o. MRN: [id number]  CC: Hypertension and Diabetes   HPI Holly Simmons presents for follow-up on hypertension and diabetes. She complains of a rash in both armpits for about 4-6 weeks. She has been using a topical steroid without much symptom relief. She says the rash is red, feels itchy, and irritated.  She tells me her blood pressure has been well controlled on metoprolol and furosemide. She has had no recent episodes of chest pain, shortness of breath, dyspnea on exertion, palpitations, syncope, edema, or fatigue.  Outpatient Medications Prior to Visit  Medication Sig Dispense Refill  . allopurinol (ZYLOPRIM) 100 MG tablet TAKE 1 TABLET (100 MG TOTAL) BY MOUTH DAILY. 90 tablet 2  . ampicillin (PRINCIPEN) 500 MG capsule Take 500 mg by mouth daily as needed (for dental work only.).    Marland Kitchen Armodafinil 150 MG tablet Take 1 tablet (150 mg total) by mouth daily as needed (for driving). 30 tablet 5  . B Complex-C (B-COMPLEX WITH VITAMIN C) tablet Take 1 tablet by mouth daily.    . Betamethasone Valerate 0.12 % foam Apply 1 application topically daily as needed (for psoriasis).    Marland Kitchen buPROPion (WELLBUTRIN XL) 150 MG 24 hr tablet TAKE 1 TABLET (150 MG TOTAL) BY MOUTH DAILY. 90 tablet 2  . Cholecalciferol 2000 UNITS TABS Take 1 tablet (2,000 Units total) by mouth daily. 90 tablet 3  . fexofenadine (ALLEGRA) 180 MG tablet Take 180 mg by mouth daily as needed for allergies.     . furosemide (LASIX) 40 MG tablet TAKE 1 TABLET (40 MG TOTAL) BY MOUTH EVERY OTHER DAY. 45 tablet 2  . hydrocortisone (ANUSOL-HC) 25 MG suppository Place 25 mg rectally daily as needed for hemorrhoids.    Marland Kitchen ipratropium (ATROVENT) 0.03 % nasal spray PLACE 2 SPRAYS INTO THE NOSE 3 TIMES DAILY AS NEEDED FOR RHINITIS. 30 mL 1  . metoprolol succinate (TOPROL-XL) 100 MG 24 hr tablet TAKE 1 TABLET (100 MG TOTAL) BY MOUTH DAILY. TAKE WITH OR  IMMEDIATELY FOLLOWING A MEAL. 90 tablet 2  . Multiple Vitamin (MULTIVITAMIN) tablet Take 1 tablet by mouth daily.    . ranitidine (ZANTAC) 300 MG tablet TAKE 1 TABLET (300 MG TOTAL) BY MOUTH AT BEDTIME. 90 tablet 2  . spironolactone (ALDACTONE) 25 MG tablet Take 1 tablet (25 mg total) by mouth every other day. 45 tablet 2  . SYMBICORT 80-4.5 MCG/ACT inhaler INHALE 2 PUFFS INTO THE LUNGS 2 (TWO) TIMES DAILY. 10.2 Inhaler 10  . TRADJENTA 5 MG TABS tablet TAKE 1 TABLET (5 MG TOTAL) BY MOUTH DAILY. 90 tablet 2  . triamcinolone cream (KENALOG) 0.5 % Apply 1 application topically 3 (three) times daily as needed (itching).     Marland Kitchen VIIBRYD 40 MG TABS TAKE 1 TABLET (40 MG TOTAL) BY MOUTH DAILY. 90 tablet 2  . warfarin (COUMADIN) 2.5 MG tablet TAKE AS DIRECTED BY COUMADIN CLINIC 40 tablet 3  . metoprolol succinate (TOPROL-XL) 100 MG 24 hr tablet TAKE 1 TABLET (100 MG TOTAL) BY MOUTH DAILY. TAKE WITH OR IMMEDIATELY FOLLOWING A MEAL. 90 tablet 2   No facility-administered medications prior to visit.     ROS Review of Systems  Constitutional: Negative for activity change, appetite change, chills, fatigue, fever and unexpected weight change.  HENT: Positive for hearing loss. Negative for ear discharge, facial swelling, rhinorrhea, sinus pressure, sneezing, sore throat, tinnitus and voice change.  She complains of ringing in her ears  Eyes: Negative.   Respiratory: Negative.  Negative for cough, choking, chest tightness, shortness of breath and stridor.   Cardiovascular: Negative.  Negative for palpitations and leg swelling.  Gastrointestinal: Negative.  Negative for abdominal pain, blood in stool, constipation, diarrhea, nausea and vomiting.  Endocrine: Negative.   Genitourinary: Negative.  Negative for decreased urine volume, difficulty urinating, dysuria, hematuria and urgency.  Musculoskeletal: Negative.   Skin: Positive for rash. Negative for color change, pallor and wound.    Allergic/Immunologic: Negative.   Neurological: Negative.  Negative for dizziness, syncope and weakness.  Hematological: Negative.  Negative for adenopathy. Does not bruise/bleed easily.  Psychiatric/Behavioral: Negative.     Objective:  BP 120/66 (BP Location: Left Arm, Patient Position: Sitting, Cuff Size: Normal)   Pulse 66   Temp 97.8 F (36.6 C) (Oral)   Ht 5\' 3"  (1.6 m)   Wt 179 lb 8 oz (81.4 kg)   SpO2 96%   BMI 31.80 kg/m   BP Readings from Last 3 Encounters:  06/04/16 120/66  03/26/16 110/66  01/30/16 (!) 150/72    Wt Readings from Last 3 Encounters:  06/04/16 179 lb 8 oz (81.4 kg)  03/26/16 180 lb 6.4 oz (81.8 kg)  01/30/16 183 lb (83 kg)    Physical Exam  Constitutional: She is oriented to person, place, and time. No distress.  HENT:  Mouth/Throat: Oropharynx is clear and moist. No oropharyngeal exudate.  Eyes: Conjunctivae are normal. Right eye exhibits no discharge. Left eye exhibits no discharge. No scleral icterus.  Neck: Normal range of motion. Neck supple. No JVD present. No tracheal deviation present. No thyromegaly present.  Cardiovascular: Normal rate, regular rhythm, normal heart sounds and intact distal pulses.  Exam reveals no gallop and no friction rub.   No murmur heard. Pulmonary/Chest: Effort normal and breath sounds normal. No stridor. No respiratory distress. She has no wheezes. She has no rales. She exhibits no tenderness.    Abdominal: Soft. Bowel sounds are normal. She exhibits no distension and no mass. There is no tenderness. There is no rebound and no guarding.  Musculoskeletal: Normal range of motion. She exhibits no edema, tenderness or deformity.  Lymphadenopathy:    She has no cervical adenopathy.  Neurological: She is oriented to person, place, and time.  Skin: Skin is warm and dry. Rash noted. She is not diaphoretic. No erythema. No pallor.  Vitals reviewed.   Lab Results  Component Value Date   WBC 7.3 10/03/2015   HGB  13.0 10/03/2015   HCT 39.7 10/03/2015   PLT 164.0 10/03/2015   GLUCOSE 74 06/04/2016   CHOL 137 10/03/2015   TRIG 75.0 10/03/2015   HDL 47.20 10/03/2015   LDLCALC 75 10/03/2015   ALT 10 06/04/2016   AST 17 06/04/2016   NA 139 06/04/2016   K 3.9 06/04/2016   CL 101 06/04/2016   CREATININE 1.25 (H) 06/04/2016   BUN 23 06/04/2016   CO2 32 06/04/2016   TSH 1.40 10/03/2015   INR 2.3 05/07/2016   HGBA1C 6.2 06/04/2016   MICROALBUR 22.8 (H) 06/04/2016    US Breast Ltd Uni Left Inc Axilla  Result Date: 03/26/2016 CLINICAL DATA:  Two year follow-up of a medial left breast mass EXAM: 2D DIGITAL DIAGNOSTIC BILATERAL MAMMOGRAM WITH CAD AND ADJUNCT TOMO ULTRASOUND LEFT BREAST COMPARISON:  Previous exam(s). ACR Breast Density Category b: There are scattered areas of fibroglandular density. FINDINGS: The medial left breast mass is unchanged mammographically. No  mammographic changes or evidence of malignancy. Mammographic images were processed with CAD. On physical exam, no suspicious lumps are identified. Targeted ultrasound is performed, showing a 5 x 3 x 5 mm cluster of cysts in the left breast at 9 o'clock, 3 cm from the nipple, unchanged in the interval. IMPRESSION: The medial left breast mass has been unchanged for 2 years requiring no additional follow-up. No mammographic or sonographic evidence of malignancy. RECOMMENDATION: Annual screening mammography I have discussed the findings and recommendations with the patient. Results were also provided in writing at the conclusion of the visit. If applicable, a reminder letter will be sent to the patient regarding the next appointment. BI-RADS CATEGORY  2: Benign. Electronically Signed   By: Dorise Bullion III M.D   On: 03/26/2016 11:29   Mm Diag Breast Tomo Bilateral  Result Date: 03/26/2016 CLINICAL DATA:  Two year follow-up of a medial left breast mass EXAM: 2D DIGITAL DIAGNOSTIC BILATERAL MAMMOGRAM WITH CAD AND ADJUNCT TOMO ULTRASOUND LEFT BREAST  COMPARISON:  Previous exam(s). ACR Breast Density Category b: There are scattered areas of fibroglandular density. FINDINGS: The medial left breast mass is unchanged mammographically. No mammographic changes or evidence of malignancy. Mammographic images were processed with CAD. On physical exam, no suspicious lumps are identified. Targeted ultrasound is performed, showing a 5 x 3 x 5 mm cluster of cysts in the left breast at 9 o'clock, 3 cm from the nipple, unchanged in the interval. IMPRESSION: The medial left breast mass has been unchanged for 2 years requiring no additional follow-up. No mammographic or sonographic evidence of malignancy. RECOMMENDATION: Annual screening mammography I have discussed the findings and recommendations with the patient. Results were also provided in writing at the conclusion of the visit. If applicable, a reminder letter will be sent to the patient regarding the next appointment. BI-RADS CATEGORY  2: Benign. Electronically Signed   By: Dorise Bullion III M.D   On: 03/26/2016 11:29    Assessment & Plan:   Terrah was seen today for hypertension and diabetes.  Diagnoses and all orders for this visit:  Diabetes mellitus due to underlying condition with diabetic nephropathy, without long-term current use of insulin ([place])- her A1c is a 6.2%, her blood sugars are well-controlled on the DPP 4 inhibitor, will continue -     Cancel: Basic metabolic panel; Future -     Microalbumin / creatinine urine ratio; Future -     Hemoglobin A1c; Future -     Comprehensive metabolic panel; Future  Elevated total protein- her total protein level is normal now, will continue to monitor for the development of lymphoproliferative disease -     Comprehensive metabolic panel; Future  Disorder resulting from impaired renal function- her renal function is stable, she will avoid nephrotoxic agents, will continue aggressive blood pressure control -     Cancel: Basic metabolic panel; Future -      Urinalysis, Routine w reflex microscopic (not at [facility]); Future -     Comprehensive metabolic panel; Future  Tinnitus aurium, bilateral- I've asked her to start taking Lipoflavinoids and to see audiology to be screened for hearing loss -     Ambulatory referral to Audiology  Tinea of the body -     ciclopirox (LOPROX) 0.77 % SUSP; Apply 1 Act topically 2 (two) times daily.   I am having Ms. Delahunt start on ciclopirox. I am also having her maintain her fexofenadine, multivitamin, B-complex with vitamin C, hydrocortisone, triamcinolone cream, ampicillin, Betamethasone Valerate, Cholecalciferol,  spironolactone, buPROPion, Armodafinil, ipratropium, ranitidine, VIIBRYD, SYMBICORT, TRADJENTA, metoprolol succinate, furosemide, allopurinol, and warfarin.  Meds ordered this encounter  Medications  . ciclopirox (LOPROX) 0.77 % SUSP    Sig: Apply 1 Act topically 2 (two) times daily.    Dispense:  60 mL    Refill:  2     Follow-up: Return in about 6 months (around 12/02/2016).  Scarlette Calico, MD

## 2016-06-13 ENCOUNTER — Encounter: Payer: Self-pay | Admitting: Internal Medicine

## 2016-06-20 ENCOUNTER — Ambulatory Visit: Payer: Self-pay

## 2016-06-20 ENCOUNTER — Ambulatory Visit (INDEPENDENT_AMBULATORY_CARE_PROVIDER_SITE_OTHER): Payer: Medicare Other | Admitting: General Practice

## 2016-06-20 DIAGNOSIS — Z5181 Encounter for therapeutic drug level monitoring: Secondary | ICD-10-CM | POA: Diagnosis not present

## 2016-06-20 LAB — POCT INR: INR: 2.7

## 2016-06-20 NOTE — Progress Notes (Signed)
I have reviewed and agree with the plan. 

## 2016-06-20 NOTE — Patient Instructions (Signed)
Pre visit review using our clinic review tool, if applicable. No additional management support is needed unless otherwise documented below in the visit note. 

## 2016-06-24 ENCOUNTER — Ambulatory Visit: Payer: Self-pay | Admitting: Internal Medicine

## 2016-06-25 ENCOUNTER — Ambulatory Visit: Payer: Medicare Other

## 2016-06-25 ENCOUNTER — Ambulatory Visit (INDEPENDENT_AMBULATORY_CARE_PROVIDER_SITE_OTHER): Payer: Medicare Other | Admitting: *Deleted

## 2016-06-25 ENCOUNTER — Ambulatory Visit: Payer: Medicare Other | Admitting: Internal Medicine

## 2016-06-25 DIAGNOSIS — I442 Atrioventricular block, complete: Secondary | ICD-10-CM

## 2016-06-25 NOTE — Progress Notes (Signed)
Remote pacemaker transmission.   

## 2016-07-01 ENCOUNTER — Other Ambulatory Visit: Payer: Self-pay | Admitting: Internal Medicine

## 2016-07-03 ENCOUNTER — Encounter: Payer: Self-pay | Admitting: Cardiology

## 2016-07-12 LAB — CUP PACEART REMOTE DEVICE CHECK
Battery Impedance: 234 Ohm
Battery Remaining Longevity: 114 mo
Brady Statistic AP VP Percent: 1 %
Brady Statistic AS VS Percent: 0 %
Implantable Lead Implant Date: 20131210
Implantable Lead Location: 753858
Implantable Lead Model: 4296
Implantable Lead Model: 5076
Implantable Pulse Generator Implant Date: 20131210
Lead Channel Impedance Value: 474 Ohm
Lead Channel Impedance Value: 961 Ohm
Lead Channel Pacing Threshold Amplitude: 0.625 V
Lead Channel Pacing Threshold Amplitude: 0.875 V
Lead Channel Setting Pacing Amplitude: 2.5 V
Lead Channel Setting Sensing Sensitivity: 8 mV
MDC IDC LEAD IMPLANT DT: 20131210
MDC IDC LEAD LOCATION: 753859
MDC IDC MSMT BATTERY VOLTAGE: 2.79 V
MDC IDC MSMT LEADCHNL RA PACING THRESHOLD PULSEWIDTH: 0.4 ms
MDC IDC MSMT LEADCHNL RV PACING THRESHOLD PULSEWIDTH: 0.4 ms
MDC IDC SESS DTM: 20171122150045
MDC IDC SET LEADCHNL RA PACING AMPLITUDE: 2 V
MDC IDC SET LEADCHNL RV PACING PULSEWIDTH: 0.4 ms
MDC IDC STAT BRADY AP VS PERCENT: 0 %
MDC IDC STAT BRADY AS VP PERCENT: 99 %

## 2016-07-30 ENCOUNTER — Other Ambulatory Visit: Payer: Self-pay | Admitting: Pulmonary Disease

## 2016-07-31 ENCOUNTER — Other Ambulatory Visit: Payer: Self-pay

## 2016-07-31 MED ORDER — IPRATROPIUM BROMIDE 0.03 % NA SOLN: NASAL | 1 refills | Status: DC

## 2016-08-08 ENCOUNTER — Ambulatory Visit (INDEPENDENT_AMBULATORY_CARE_PROVIDER_SITE_OTHER): Payer: Medicare Other | Admitting: General Practice

## 2016-08-08 DIAGNOSIS — I4892 Unspecified atrial flutter: Secondary | ICD-10-CM

## 2016-08-08 DIAGNOSIS — I483 Typical atrial flutter: Secondary | ICD-10-CM | POA: Diagnosis not present

## 2016-08-08 DIAGNOSIS — Z5181 Encounter for therapeutic drug level monitoring: Secondary | ICD-10-CM

## 2016-08-08 LAB — POCT INR: INR: 3.1

## 2016-08-08 NOTE — Progress Notes (Signed)
I have reviewed and agree with the plan. 

## 2016-08-08 NOTE — Patient Instructions (Signed)
Pre visit review using our clinic review tool, if applicable. No additional management support is needed unless otherwise documented below in the visit note. 

## 2016-09-05 ENCOUNTER — Ambulatory Visit (INDEPENDENT_AMBULATORY_CARE_PROVIDER_SITE_OTHER): Payer: Medicare Other | Admitting: General Practice

## 2016-09-05 DIAGNOSIS — I4892 Unspecified atrial flutter: Secondary | ICD-10-CM

## 2016-09-05 DIAGNOSIS — Z5181 Encounter for therapeutic drug level monitoring: Secondary | ICD-10-CM

## 2016-09-05 DIAGNOSIS — I483 Typical atrial flutter: Secondary | ICD-10-CM

## 2016-09-05 LAB — POCT INR: INR: 3.2

## 2016-09-05 NOTE — Progress Notes (Signed)
I have reviewed and agree with the plan. 

## 2016-09-05 NOTE — Patient Instructions (Signed)
Pre visit review using our clinic review tool, if applicable. No additional management support is needed unless otherwise documented below in the visit note. 

## 2016-09-17 ENCOUNTER — Encounter: Payer: Self-pay | Admitting: Internal Medicine

## 2016-09-17 ENCOUNTER — Other Ambulatory Visit (INDEPENDENT_AMBULATORY_CARE_PROVIDER_SITE_OTHER): Payer: Medicare Other

## 2016-09-17 ENCOUNTER — Ambulatory Visit (INDEPENDENT_AMBULATORY_CARE_PROVIDER_SITE_OTHER): Payer: Medicare Other | Admitting: Internal Medicine

## 2016-09-17 VITALS — BP 124/64 | HR 65 | Temp 97.5°F | Resp 16 | Ht 63.0 in | Wt 189.5 lb

## 2016-09-17 DIAGNOSIS — E785 Hyperlipidemia, unspecified: Secondary | ICD-10-CM | POA: Diagnosis not present

## 2016-09-17 DIAGNOSIS — F418 Other specified anxiety disorders: Secondary | ICD-10-CM

## 2016-09-17 DIAGNOSIS — K219 Gastro-esophageal reflux disease without esophagitis: Secondary | ICD-10-CM

## 2016-09-17 DIAGNOSIS — E118 Type 2 diabetes mellitus with unspecified complications: Secondary | ICD-10-CM

## 2016-09-17 DIAGNOSIS — N259 Disorder resulting from impaired renal tubular function, unspecified: Secondary | ICD-10-CM

## 2016-09-17 LAB — CBC WITH DIFFERENTIAL/PLATELET
BASOS ABS: 0 10*3/uL (ref 0.0–0.1)
Basophils Relative: 0.6 % (ref 0.0–3.0)
Eosinophils Absolute: 0.3 10*3/uL (ref 0.0–0.7)
Eosinophils Relative: 3.9 % (ref 0.0–5.0)
HEMATOCRIT: 38.3 % (ref 36.0–46.0)
HEMOGLOBIN: 12.7 g/dL (ref 12.0–15.0)
LYMPHS PCT: 10.9 % — AB (ref 12.0–46.0)
Lymphs Abs: 0.8 10*3/uL (ref 0.7–4.0)
MCHC: 33.1 g/dL (ref 30.0–36.0)
MCV: 93.7 fl (ref 78.0–100.0)
MONOS PCT: 8.9 % (ref 3.0–12.0)
Monocytes Absolute: 0.7 10*3/uL (ref 0.1–1.0)
NEUTROS PCT: 75.7 % (ref 43.0–77.0)
Neutro Abs: 5.9 10*3/uL (ref 1.4–7.7)
Platelets: 149 10*3/uL — ABNORMAL LOW (ref 150.0–400.0)
RBC: 4.09 Mil/uL (ref 3.87–5.11)
RDW: 15.1 % (ref 11.5–15.5)
WBC: 7.8 10*3/uL (ref 4.0–10.5)

## 2016-09-17 LAB — BASIC METABOLIC PANEL
BUN: 24 mg/dL — ABNORMAL HIGH (ref 6–23)
CHLORIDE: 102 meq/L (ref 96–112)
CO2: 31 meq/L (ref 19–32)
CREATININE: 1.42 mg/dL — AB (ref 0.40–1.20)
Calcium: 10.4 mg/dL (ref 8.4–10.5)
GFR: 38.52 mL/min — ABNORMAL LOW (ref >60.00)
GLUCOSE: 99 mg/dL (ref 70–99)
Potassium: 4.2 mEq/L (ref 3.5–5.1)
Sodium: 138 mEq/L (ref 135–145)

## 2016-09-17 LAB — URINALYSIS, ROUTINE W REFLEX MICROSCOPIC
BILIRUBIN URINE: NEGATIVE
Hgb urine dipstick: NEGATIVE
KETONES UR: NEGATIVE
Leukocytes, UA: NEGATIVE
Nitrite: NEGATIVE
Total Protein, Urine: 30 — AB
UROBILINOGEN UA: 0.2 (ref 0.0–1.0)
Urine Glucose: NEGATIVE
pH: 5.5 (ref 5.0–8.0)

## 2016-09-17 LAB — LIPID PANEL
CHOLESTEROL: 139 mg/dL (ref 0–200)
HDL: 53.8 mg/dL (ref >39.00)
LDL CALC: 70 mg/dL (ref 0–99)
NonHDL: 85.53
Total CHOL/HDL Ratio: 3
Triglycerides: 77 mg/dL (ref 0.0–149.0)
VLDL: 15.4 mg/dL (ref 0.0–40.0)

## 2016-09-17 LAB — TSH: TSH: 2.92 u[IU]/mL (ref 0.35–4.50)

## 2016-09-17 LAB — HEMOGLOBIN A1C: HEMOGLOBIN A1C: 6.6 % — AB (ref 4.6–6.5)

## 2016-09-17 MED ORDER — LAMOTRIGINE 25 MG PO TABS: 25.0000 mg | ORAL_TABLET | Freq: Every day | ORAL | 1 refills | Status: DC

## 2016-09-17 NOTE — Progress Notes (Signed)
Subjective:  Patient ID: Holly Simmons, female    DOB: 1940/08/20  Age: 76 y.o. MRN: [id number]  CC: Hyperlipidemia and Diabetes   HPI Holly Simmons presents for follow-up. Since I last saw her she has developed mild, worsening DOE. She has an appt with cardiology next week. She denies any recent episodes of palpitations, chest pain, edema, or near-syncope.  She complains of mood swings with anxiety, depression, anhedonia, and irritability. She has gotten some symptom relief with Viibryd but she wants to know if there is something else she can take to improve the symptoms.  Outpatient Medications Prior to Visit  Medication Sig Dispense Refill  . allopurinol (ZYLOPRIM) 100 MG tablet TAKE 1 TABLET (100 MG TOTAL) BY MOUTH DAILY. 90 tablet 2  . ampicillin (PRINCIPEN) 500 MG capsule Take 500 mg by mouth daily as needed (for dental work only.).    Marland Kitchen Armodafinil 150 MG tablet Take 1 tablet (150 mg total) by mouth daily as needed (for driving). 30 tablet 5  . B Complex-C (B-COMPLEX WITH VITAMIN C) tablet Take 1 tablet by mouth daily.    . Betamethasone Valerate 0.12 % foam APPLY TO SCALP AS DIRECTED 100 g 1  . buPROPion (WELLBUTRIN XL) 150 MG 24 hr tablet TAKE 1 TABLET (150 MG TOTAL) BY MOUTH DAILY. 90 tablet 2  . Cholecalciferol 2000 UNITS TABS Take 1 tablet (2,000 Units total) by mouth daily. 90 tablet 3  . ciclopirox (LOPROX) 0.77 % SUSP Apply 1 Act topically 2 (two) times daily. 60 mL 2  . fexofenadine (ALLEGRA) 180 MG tablet Take 180 mg by mouth daily as needed for allergies.     . furosemide (LASIX) 40 MG tablet TAKE 1 TABLET (40 MG TOTAL) BY MOUTH EVERY OTHER DAY. 45 tablet 2  . hydrocortisone (ANUSOL-HC) 25 MG suppository Place 25 mg rectally daily as needed for hemorrhoids.    Marland Kitchen ipratropium (ATROVENT) 0.03 % nasal spray PLACE 2 SPRAYS INTO THE NOSE 3 TIMES DAILY AS NEEDED FOR RHINITIS. 30 mL 2  . metoprolol succinate (TOPROL-XL) 100 MG 24 hr tablet TAKE 1 TABLET (100 MG  TOTAL) BY MOUTH DAILY. TAKE WITH OR IMMEDIATELY FOLLOWING A MEAL. 90 tablet 2  . Multiple Vitamin (MULTIVITAMIN) tablet Take 1 tablet by mouth daily.    . ranitidine (ZANTAC) 300 MG tablet TAKE 1 TABLET (300 MG TOTAL) BY MOUTH AT BEDTIME. 90 tablet 2  . spironolactone (ALDACTONE) 25 MG tablet Take 1 tablet (25 mg total) by mouth every other day. 45 tablet 2  . SYMBICORT 80-4.5 MCG/ACT inhaler INHALE 2 PUFFS INTO THE LUNGS 2 (TWO) TIMES DAILY. 10.2 Inhaler 10  . TRADJENTA 5 MG TABS tablet TAKE 1 TABLET (5 MG TOTAL) BY MOUTH DAILY. 90 tablet 2  . triamcinolone cream (KENALOG) 0.5 % Apply 1 application topically 3 (three) times daily as needed (itching).     Marland Kitchen VIIBRYD 40 MG TABS TAKE 1 TABLET (40 MG TOTAL) BY MOUTH DAILY. 90 tablet 2  . warfarin (COUMADIN) 2.5 MG tablet TAKE AS DIRECTED BY COUMADIN CLINIC 40 tablet 3  . ipratropium (ATROVENT) 0.03 % nasal spray PLACE 2 SPRAYS INTO THE NOSE 3 TIMES DAILY AS NEEDED FOR RHINITIS. 30 mL 1   No facility-administered medications prior to visit.     ROS Review of Systems  Constitutional: Negative for appetite change, chills, diaphoresis, fatigue and unexpected weight change.  HENT: Negative.   Eyes: Negative for visual disturbance.  Respiratory: Positive for shortness of breath (DOE). Negative for cough,  chest tightness, wheezing and stridor.   Cardiovascular: Negative for chest pain and leg swelling.  Gastrointestinal: Negative.  Negative for abdominal pain, constipation, diarrhea, nausea and vomiting.  Genitourinary: Negative.  Negative for difficulty urinating.  Musculoskeletal: Negative.  Negative for back pain and neck pain.  Skin: Negative.  Negative for color change and rash.  Allergic/Immunologic: Negative.   Neurological: Negative.  Negative for dizziness, weakness and light-headedness.  Hematological: Negative for adenopathy. Does not bruise/bleed easily.  Psychiatric/Behavioral: Negative.     Objective:  BP 124/64 (BP Location: Left  Arm, Patient Position: Sitting, Cuff Size: Normal)   Pulse 65   Temp 97.5 F (36.4 C) (Oral)   Resp 16   Ht 5\' 3"  (1.6 m)   Wt 189 lb 8 oz (86 kg)   SpO2 93%   BMI 33.57 kg/m   BP Readings from Last 3 Encounters:  09/17/16 124/64  06/04/16 120/66  03/26/16 110/66    Wt Readings from Last 3 Encounters:  09/17/16 189 lb 8 oz (86 kg)  06/04/16 179 lb 8 oz (81.4 kg)  03/26/16 180 lb 6.4 oz (81.8 kg)    Physical Exam  Constitutional: She is oriented to person, place, and time. No distress.  HENT:  Mouth/Throat: No oropharyngeal exudate.  Eyes: Conjunctivae are normal. Right eye exhibits no discharge. Left eye exhibits no discharge. No scleral icterus.  Neck: Normal range of motion. Neck supple. No JVD present. No tracheal deviation present. No thyromegaly present.  Cardiovascular: Normal rate, regular rhythm, S1 normal and S2 normal.  Exam reveals no gallop and no friction rub.   Murmur heard.  Systolic murmur is present   No diastolic murmur is present  + syst click  Pulmonary/Chest: Effort normal and breath sounds normal. No stridor. No respiratory distress. She has no wheezes. She has no rales. She exhibits no tenderness.  Abdominal: Soft. Bowel sounds are normal. She exhibits no distension and no mass. There is no tenderness. There is no rebound and no guarding.  Musculoskeletal: Normal range of motion. She exhibits no edema, tenderness or deformity.  Lymphadenopathy:    She has no cervical adenopathy.  Neurological: She is oriented to person, place, and time.  Skin: Skin is warm and dry. No rash noted. She is not diaphoretic. No erythema. No pallor.  Vitals reviewed.   Lab Results  Component Value Date   WBC 7.8 09/17/2016   HGB 12.7 09/17/2016   HCT 38.3 09/17/2016   PLT 149.0 (L) 09/17/2016   GLUCOSE 99 09/17/2016   CHOL 139 09/17/2016   TRIG 77.0 09/17/2016   HDL 53.80 09/17/2016   LDLCALC 70 09/17/2016   ALT 10 06/04/2016   AST 17 06/04/2016   NA 138  09/17/2016   K 4.2 09/17/2016   CL 102 09/17/2016   CREATININE 1.42 (H) 09/17/2016   BUN 24 (H) 09/17/2016   CO2 31 09/17/2016   TSH 2.92 09/17/2016   INR 3.2 09/05/2016   HGBA1C 6.6 (H) 09/17/2016   MICROALBUR 22.8 (H) 06/04/2016    US Breast Ltd Uni Left Inc Axilla  Result Date: 03/26/2016 CLINICAL DATA:  Two year follow-up of a medial left breast mass EXAM: 2D DIGITAL DIAGNOSTIC BILATERAL MAMMOGRAM WITH CAD AND ADJUNCT TOMO ULTRASOUND LEFT BREAST COMPARISON:  Previous exam(s). ACR Breast Density Category b: There are scattered areas of fibroglandular density. FINDINGS: The medial left breast mass is unchanged mammographically. No mammographic changes or evidence of malignancy. Mammographic images were processed with CAD. On physical exam, no suspicious lumps  are identified. Targeted ultrasound is performed, showing a 5 x 3 x 5 mm cluster of cysts in the left breast at 9 o'clock, 3 cm from the nipple, unchanged in the interval. IMPRESSION: The medial left breast mass has been unchanged for 2 years requiring no additional follow-up. No mammographic or sonographic evidence of malignancy. RECOMMENDATION: Annual screening mammography I have discussed the findings and recommendations with the patient. Results were also provided in writing at the conclusion of the visit. If applicable, a reminder letter will be sent to the patient regarding the next appointment. BI-RADS CATEGORY  2: Benign. Electronically Signed   By: Dorise Bullion III M.D   On: 03/26/2016 11:29   Mm Diag Breast Tomo Bilateral  Result Date: 03/26/2016 CLINICAL DATA:  Two year follow-up of a medial left breast mass EXAM: 2D DIGITAL DIAGNOSTIC BILATERAL MAMMOGRAM WITH CAD AND ADJUNCT TOMO ULTRASOUND LEFT BREAST COMPARISON:  Previous exam(s). ACR Breast Density Category b: There are scattered areas of fibroglandular density. FINDINGS: The medial left breast mass is unchanged mammographically. No mammographic changes or evidence of  malignancy. Mammographic images were processed with CAD. On physical exam, no suspicious lumps are identified. Targeted ultrasound is performed, showing a 5 x 3 x 5 mm cluster of cysts in the left breast at 9 o'clock, 3 cm from the nipple, unchanged in the interval. IMPRESSION: The medial left breast mass has been unchanged for 2 years requiring no additional follow-up. No mammographic or sonographic evidence of malignancy. RECOMMENDATION: Annual screening mammography I have discussed the findings and recommendations with the patient. Results were also provided in writing at the conclusion of the visit. If applicable, a reminder letter will be sent to the patient regarding the next appointment. BI-RADS CATEGORY  2: Benign. Electronically Signed   By: Dorise Bullion III M.D   On: 03/26/2016 11:29    Assessment & Plan:   Nadege was seen today for hyperlipidemia and diabetes.  Diagnoses and all orders for this visit:  Hyperlipidemia with target LDL less than 100- She has achieved her LDL goal and is not willing to take a statin for cardiovascular risk reduction. -     Lipid panel; Future -     TSH; Future  Gastroesophageal reflux disease without esophagitis- her symptoms are adequately well controlled and there is no evidence of upper GI pathology. -     CBC with Differential/Platelet; Future  Disorder resulting from impaired renal function- her GFR is down to 36.7 mL's per minute. We'll continue to achieve good blood sugar and blood pressure control. She agrees to avoid nephrotoxic agents. -     Basic metabolic panel; Future -     Urinalysis, Routine w reflex microscopic; Future  Type 2 diabetes mellitus with complication, without long-term current use of insulin ([place])- her A1c is at 6.6%, her blood sugars are adequately well controlled. -     Hemoglobin A1c; Future  Depression with anxiety- will add a mood stabilizer to Viibryd -     lamoTRIgine (LAMICTAL) 25 MG tablet; Take 1 tablet (25 mg  total) by mouth daily.   I am having Ms. Nemes start on lamoTRIgine. I am also having her maintain her fexofenadine, multivitamin, B-complex with vitamin C, hydrocortisone, triamcinolone cream, ampicillin, Cholecalciferol, spironolactone, Armodafinil, ranitidine, VIIBRYD, SYMBICORT, TRADJENTA, metoprolol succinate, furosemide, allopurinol, warfarin, ciclopirox, buPROPion, Betamethasone Valerate, and ipratropium.  Meds ordered this encounter  Medications  . lamoTRIgine (LAMICTAL) 25 MG tablet    Sig: Take 1 tablet (25 mg total) by mouth  daily.    Dispense:  90 tablet    Refill:  1     Follow-up: Return in about 6 months (around 03/17/2017).  Scarlette Calico, MD

## 2016-09-17 NOTE — Patient Instructions (Signed)

## 2016-09-17 NOTE — Progress Notes (Signed)
Pre visit review using our clinic review tool, if applicable. No additional management support is needed unless otherwise documented below in the visit note. 

## 2016-09-26 ENCOUNTER — Ambulatory Visit (INDEPENDENT_AMBULATORY_CARE_PROVIDER_SITE_OTHER): Payer: Medicare Other | Admitting: Internal Medicine

## 2016-09-26 ENCOUNTER — Encounter: Payer: Self-pay | Admitting: Internal Medicine

## 2016-09-26 VITALS — BP 120/64 | HR 78 | Ht 63.0 in | Wt 179.2 lb

## 2016-09-26 DIAGNOSIS — I4819 Other persistent atrial fibrillation: Secondary | ICD-10-CM

## 2016-09-26 DIAGNOSIS — I481 Persistent atrial fibrillation: Secondary | ICD-10-CM | POA: Diagnosis not present

## 2016-09-26 DIAGNOSIS — I442 Atrioventricular block, complete: Secondary | ICD-10-CM | POA: Diagnosis not present

## 2016-09-26 DIAGNOSIS — I495 Sick sinus syndrome: Secondary | ICD-10-CM

## 2016-09-26 NOTE — Progress Notes (Signed)
Electrophysiology Office Note   Date:  09/26/2016   ID:  Holly Simmons, DOB [birth year], MRN [id number]  PCP:  Scarlette Calico, MD  Cardiologist:  Dr Johnsie Cancel Primary Electrophysiologist: Thompson Grayer, MD    Chief Complaint  Patient presents with  . Atrial Fibrillation     History of Present Illness: Holly Simmons is a 76 y.o. female who presents today for electrophysiology evaluation.    She is currently doing well. Asymptomatic with persistent afib.  Today, she denies symptoms of palpitations, chest pain, shortness of breath, orthopnea, PND, lower extremity edema, claudication, dizziness, presyncope, syncope, bleeding, or neurologic sequela. The patient is tolerating medications without difficulties and is otherwise without complaint today.      Past Medical History:  Diagnosis Date  . Allergic rhinitis   . Asthma    NOS w/ acute exacerbation  . Blood transfusion without reported diagnosis   . Colon polyps    TUBULAR ADENOMAS AND HYPERPLASTIC  . Complete heart block (HCC)    requiring PPM (MDT) post surgical myomectomy at [facility],  leads are epicardial with abdominal implant, high ventricular threshold at implant  . COPD (chronic obstructive pulmonary disease) ([place])   . Depressive disorder   . Diastolic dysfunction   . DM (diabetes mellitus) ([place])   . Gallstones   . GERD (gastroesophageal reflux disease)   . Gout 08/20/2012  . Heart murmur   . Hyperpotassemia   . Hypersomnia   . Hypertension   . Hypertrophic cardiomyopathy ([place])    s/p surgical myomectomy at [facility] 01/75 complicated by septal VSD post procedure requiring reoperation with patch closure and tricuspid valve replacement  . Kidney stones   . Myocardial infarction 2011  . Obstructive sleep apnea    persistent daytime sleepiness despite cpap  . 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 Psoriasis   . Pyuria   . Renal insufficiency   . 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 Typical atrial flutter ([place])  9/15   Past Surgical History:  Procedure Laterality Date  . ABDOMINAL HYSTERECTOMY    . APPENDECTOMY    . CARDIOVERSION N/A 08/01/2014   Procedure: CARDIOVERSION;  Surgeon: Thayer Headings, MD;  Location: [facility];  Service: Cardiovascular;  Laterality: N/A;  . CESAREAN SECTION    . CHOLECYSTECTOMY    . COLONOSCOPY    . EYE SURGERY  [id number]   left eye lens implant  . EYE SURGERY  [id number]   right eye lens implant  . INSERT / REPLACE / REMOVE PACEMAKER  07/13/2012  . PACEMAKER INSERTION  07/02/11   epicardial wires with abdominal implant at [facility] 12/12,  high ventricular lead threshold at implant per Dr Westley Gambles  . PERMANENT PACEMAKER INSERTION N/A 07/13/2012   Procedure: PERMANENT PACEMAKER INSERTION;  Surgeon: Thompson Grayer, MD;  Location: [facility] CATH LAB;  Service: Cardiovascular;  Laterality: N/A;  . POLYPECTOMY    . septal myomectomy for hypertrophic CM  06/23/11   by Dr Evelina Dun at [facility], complicated by septal VSD requiring patch repair and tricuspid valve replacement  . TONSILLECTOMY    . TRICUSPID VALVE REPLACEMENT  11/12   Medtronic 70mm Mosaic tissue valve  . VSD REPAIR  06/2011     Current Outpatient Prescriptions  Medication Sig Dispense Refill  . allopurinol (ZYLOPRIM) 100 MG tablet TAKE 1 TABLET (100 MG TOTAL) BY MOUTH DAILY. 90 tablet 2  . ampicillin (PRINCIPEN) 500 MG capsule Take 500 mg by mouth daily as needed (for dental work only.).    Marland Kitchen  Armodafinil 150 MG tablet Take 1 tablet (150 mg total) by mouth daily as needed (for driving). 30 tablet 5  . B Complex-C (B-COMPLEX WITH VITAMIN C) tablet Take 1 tablet by mouth daily.    . Betamethasone Valerate 0.12 % foam APPLY TO SCALP AS DIRECTED 100 g 1  . buPROPion (WELLBUTRIN XL) 150 MG 24 hr tablet TAKE 1 TABLET (150 MG TOTAL) BY MOUTH DAILY. 90 tablet 2  . Cholecalciferol 2000 UNITS TABS Take 1 tablet (2,000 Units total) by mouth daily. 90 tablet 3  . ciclopirox (LOPROX) 0.77 % SUSP Apply 1 Act topically 2 (two) times  daily. 60 mL 2  . fexofenadine (ALLEGRA) 180 MG tablet Take 180 mg by mouth daily as needed for allergies.     . furosemide (LASIX) 40 MG tablet TAKE 1 TABLET (40 MG TOTAL) BY MOUTH EVERY OTHER DAY. 45 tablet 2  . hydrocortisone (ANUSOL-HC) 25 MG suppository Place 25 mg rectally daily as needed for hemorrhoids.    Marland Kitchen ipratropium (ATROVENT) 0.03 % nasal spray PLACE 2 SPRAYS INTO THE NOSE 3 TIMES DAILY AS NEEDED FOR RHINITIS. 30 mL 2  . lamoTRIgine (LAMICTAL) 25 MG tablet Take 1 tablet (25 mg total) by mouth daily. 90 tablet 1  . metoprolol succinate (TOPROL-XL) 100 MG 24 hr tablet TAKE 1 TABLET (100 MG TOTAL) BY MOUTH DAILY. TAKE WITH OR IMMEDIATELY FOLLOWING A MEAL. 90 tablet 2  . Multiple Vitamin (MULTIVITAMIN) tablet Take 1 tablet by mouth daily.    Marland Kitchen PRESCRIPTION MEDICATION CPAP    . ranitidine (ZANTAC) 300 MG tablet TAKE 1 TABLET (300 MG TOTAL) BY MOUTH AT BEDTIME. 90 tablet 2  . spironolactone (ALDACTONE) 25 MG tablet Take 1 tablet (25 mg total) by mouth every other day. 45 tablet 2  . SYMBICORT 80-4.5 MCG/ACT inhaler INHALE 2 PUFFS INTO THE LUNGS 2 (TWO) TIMES DAILY. 10.2 Inhaler 10  . TRADJENTA 5 MG TABS tablet TAKE 1 TABLET (5 MG TOTAL) BY MOUTH DAILY. 90 tablet 2  . triamcinolone cream (KENALOG) 0.5 % Apply 1 application topically 3 (three) times daily as needed (itching).     Marland Kitchen VIIBRYD 40 MG TABS TAKE 1 TABLET (40 MG TOTAL) BY MOUTH DAILY. 90 tablet 2  . warfarin (COUMADIN) 2.5 MG tablet TAKE AS DIRECTED BY COUMADIN CLINIC 40 tablet 3   No current facility-administered medications for this visit.     Allergies:   Sulfonamide derivatives; Tetanus toxoids; Other; and Nsaids   Social History:  The patient  reports that she has never smoked. She has never used smokeless tobacco. She reports that she does not drink alcohol or use drugs.   Family History:  The patient's family history includes Bladder Cancer in her father; Breast cancer in her mother and paternal aunt; Cancer in her father  and mother; Dementia in her maternal aunt, maternal uncle, and mother; Heart attack in her father; Heart disease in her maternal grandfather and paternal grandfather; Hypertension in her daughter and mother; Ovarian cancer in her maternal aunt; Pancreatic cancer in her cousin; Prostate cancer in her father; Varicose Veins in her father.    ROS:  Please see the history of present illness.   All other systems are reviewed and negative.    PHYSICAL EXAM: VS:  BP 120/64   Pulse 78   Ht 5\' 3"  (1.6 m)   Wt 179 lb 3.2 oz (81.3 kg)   BMI 31.74 kg/m  , BMI Body mass index is 31.74 kg/m. GEN: Well nourished, well developed,  in no acute distress  HEENT: normal  Neck: no JVD, carotid bruits, or masses Cardiac: RRR; no murmurs, rubs, or gallops,no edema  Respiratory:  clear to auscultation bilaterally, normal work of breathing GI: soft, nontender, nondistended, + BS MS: no deformity or atrophy  Skin: warm and dry, device pocket is well healed Neuro:  Strength and sensation are intact Psych: euthymic mood, full affect  Device interrogation is reviewed today in detail.  See PaceArt for details.   Recent Labs: 06/04/2016: ALT 10 09/17/2016: BUN 24; Creatinine, Ser 1.42; Hemoglobin 12.7; Platelets 149.0; Potassium 4.2; Sodium 138; TSH 2.92    Lipid Panel     Component Value Date/Time   CHOL 139 09/17/2016 1413   TRIG 77.0 09/17/2016 1413   HDL 53.80 09/17/2016 1413   CHOLHDL 3 09/17/2016 1413   VLDL 15.4 09/17/2016 1413   LDLCALC 70 09/17/2016 1413     Wt Readings from Last 3 Encounters:  09/26/16 179 lb 3.2 oz (81.3 kg)  09/17/16 189 lb 8 oz (86 kg)  06/04/16 179 lb 8 oz (81.4 kg)    Other studies Reviewed: Additional studies/ records that were reviewed today include: Dr Mariana Arn note   ASSESSMENT AND PLAN:   1. atrial flutter/ afib Asymptomatic On coumadin  2. Complete heart block Normal pacemaker function  See Pace Art report. No changes today Consider VVIR upon  return  3. Hypertensive cardiovascular disease Stable No change required today  Continue carelink remote transmissions Return to see me in 12 months unless further arrhythmias arise Follow-up with Dr Johnsie Cancel as scheduled.   Current medicines are reviewed at length with the patient today.   The patient does not have concerns regarding her medicines.  The following changes were made today:  none  Signed, Thompson Grayer, MD  09/26/2016   [address] [place] [address] [phone] (office) [phone] (fax)

## 2016-09-26 NOTE — Patient Instructions (Addendum)
Medication Instructions:  Your physician recommends that you continue on your current medications as directed. Please refer to the Current Medication list given to you today.   Labwork: None ordered   Testing/Procedures: None ordered   Follow-Up:  Remote monitoring is used to monitor your Pacemaker  from home. This monitoring reduces the number of office visits required to check your device to one time per year. It allows [place] to keep an eye on the functioning of your device to ensure it is working properly. You are scheduled for a device check from home on 12/30/15. You may send your transmission at any time that day. If you have a wireless device, the transmission will be sent automatically. After your physician reviews your transmission, you will receive a postcard with your next transmission date.    Your physician wants you to follow-up in: 12 months with Dr Vallery Ridge will receive a reminder letter in the mail two months in advance. If you don't receive a letter, please call our office to schedule the follow-up appointment.   Any Other Special Instructions Will Be Listed Below (If Applicable).     If you need a refill on your cardiac medications before your next appointment, please call your pharmacy.

## 2016-09-29 ENCOUNTER — Other Ambulatory Visit: Payer: Self-pay | Admitting: Internal Medicine

## 2016-09-29 DIAGNOSIS — I110 Hypertensive heart disease with heart failure: Secondary | ICD-10-CM

## 2016-09-29 DIAGNOSIS — I5032 Chronic diastolic (congestive) heart failure: Secondary | ICD-10-CM

## 2016-09-29 DIAGNOSIS — I422 Other hypertrophic cardiomyopathy: Secondary | ICD-10-CM

## 2016-09-29 LAB — CUP PACEART INCLINIC DEVICE CHECK
Battery Impedance: 258 Ohm
Battery Remaining Longevity: 111 mo
Battery Voltage: 2.79 V
Date Time Interrogation Session: 20180223205840
Implantable Lead Implant Date: 20131210
Implantable Lead Location: 753858
Implantable Lead Location: 753859
Implantable Lead Model: 4296
Implantable Pulse Generator Implant Date: 20131210
Lead Channel Impedance Value: 1025 Ohm
Lead Channel Pacing Threshold Pulse Width: 0.4 ms
Lead Channel Setting Pacing Amplitude: 2 V
Lead Channel Setting Pacing Amplitude: 2.5 V
Lead Channel Setting Pacing Pulse Width: 0.4 ms
Lead Channel Setting Sensing Sensitivity: 8 mV
MDC IDC LEAD IMPLANT DT: 20131210
MDC IDC MSMT LEADCHNL RA IMPEDANCE VALUE: 495 Ohm
MDC IDC MSMT LEADCHNL RA SENSING INTR AMPL: 2.8 mV
MDC IDC MSMT LEADCHNL RV PACING THRESHOLD AMPLITUDE: 0.75 V
MDC IDC MSMT LEADCHNL RV SENSING INTR AMPL: 15.67 mV
MDC IDC STAT BRADY AP VP PERCENT: 2 %
MDC IDC STAT BRADY AP VS PERCENT: 0 %
MDC IDC STAT BRADY AS VP PERCENT: 98 %
MDC IDC STAT BRADY AS VS PERCENT: 0 %

## 2016-09-30 ENCOUNTER — Other Ambulatory Visit: Payer: Self-pay | Admitting: *Deleted

## 2016-09-30 NOTE — Telephone Encounter (Signed)
Pharmacy requests ninety day. 

## 2016-10-01 MED ORDER — WARFARIN SODIUM 2.5 MG PO TABS: ORAL_TABLET | ORAL | 3 refills | Status: DC

## 2016-10-02 ENCOUNTER — Other Ambulatory Visit: Payer: Self-pay | Admitting: *Deleted

## 2016-10-02 MED ORDER — WARFARIN SODIUM 2.5 MG PO TABS: ORAL_TABLET | ORAL | 3 refills | Status: DC

## 2016-10-02 NOTE — Telephone Encounter (Signed)
Pharmacy request ninety day.

## 2016-10-03 ENCOUNTER — Ambulatory Visit (INDEPENDENT_AMBULATORY_CARE_PROVIDER_SITE_OTHER): Payer: Medicare Other | Admitting: General Practice

## 2016-10-03 DIAGNOSIS — Z5181 Encounter for therapeutic drug level monitoring: Secondary | ICD-10-CM | POA: Diagnosis not present

## 2016-10-03 LAB — POCT INR: INR: 2.9

## 2016-10-03 NOTE — Progress Notes (Signed)
I have reviewed and agree with the plan. 

## 2016-10-03 NOTE — Patient Instructions (Signed)
Pre visit review using our clinic review tool, if applicable. No additional management support is needed unless otherwise documented below in the visit note. 

## 2016-10-28 ENCOUNTER — Other Ambulatory Visit: Payer: Self-pay | Admitting: Internal Medicine

## 2016-10-28 DIAGNOSIS — M1000 Idiopathic gout, unspecified site: Secondary | ICD-10-CM

## 2016-10-28 DIAGNOSIS — I422 Other hypertrophic cardiomyopathy: Secondary | ICD-10-CM

## 2016-10-28 DIAGNOSIS — E118 Type 2 diabetes mellitus with unspecified complications: Secondary | ICD-10-CM

## 2016-10-28 DIAGNOSIS — I480 Paroxysmal atrial fibrillation: Secondary | ICD-10-CM

## 2016-10-28 DIAGNOSIS — I5032 Chronic diastolic (congestive) heart failure: Secondary | ICD-10-CM

## 2016-10-28 DIAGNOSIS — I110 Hypertensive heart disease with heart failure: Secondary | ICD-10-CM

## 2016-10-28 MED ORDER — METOPROLOL SUCCINATE ER 100 MG PO TB24: 100.0000 mg | ORAL_TABLET | Freq: Every day | ORAL | 2 refills | Status: DC

## 2016-10-28 MED ORDER — ALLOPURINOL 100 MG PO TABS: 100.0000 mg | ORAL_TABLET | Freq: Every day | ORAL | 2 refills | Status: DC

## 2016-10-28 MED ORDER — LINAGLIPTIN 5 MG PO TABS: 5.0000 mg | ORAL_TABLET | Freq: Every day | ORAL | 2 refills | Status: DC

## 2016-10-29 ENCOUNTER — Ambulatory Visit: Payer: Self-pay | Admitting: Pulmonary Disease

## 2016-10-29 ENCOUNTER — Telehealth: Payer: Self-pay

## 2016-10-29 NOTE — Telephone Encounter (Signed)
rf rq to [facility] for ciclopirox 0.77%.   [facility] is new and pharmacy has been updated in her chart)

## 2016-10-30 ENCOUNTER — Other Ambulatory Visit: Payer: Self-pay | Admitting: Internal Medicine

## 2016-10-30 DIAGNOSIS — B354 Tinea corporis: Secondary | ICD-10-CM

## 2016-10-30 MED ORDER — CICLOPIROX OLAMINE 0.77 % EX SUSP
1.0000 | Freq: Two times a day (BID) | CUTANEOUS | 2 refills | Status: DC
Start: 2016-10-30 — End: 2017-11-17

## 2016-11-04 ENCOUNTER — Telehealth: Payer: Self-pay | Admitting: Pulmonary Disease

## 2016-11-04 NOTE — Telephone Encounter (Signed)
Spoke with pharmacy, states that pt is requesting a refill on Nuvigil 150mg .  This is not on pt's current rx list.   Last ov: 10/09/2015 Next ov: 12/17/16 (rescheduled d/t VS having jury duty)  Printed rx was given to pt to use prn at last office visit- this is the last time this rx was given.    Sending to AD(DOD) to address on 4/4 as VS is out of town this week.  AD ok to call in nuvigil?  Thanks.

## 2016-11-04 NOTE — Telephone Encounter (Signed)
OK to refill nuvigil.  VS mentioned it  in his note in 10/2015.  What is the dose? How often does she take it?   J. Shirl Harris, MD 11/04/2016, 5:42 PM [facility] Pager [phone] After 3 pm or if no answer, call [phone]

## 2016-11-05 ENCOUNTER — Ambulatory Visit (INDEPENDENT_AMBULATORY_CARE_PROVIDER_SITE_OTHER): Payer: Medicare Other | Admitting: General Practice

## 2016-11-05 ENCOUNTER — Other Ambulatory Visit: Payer: Self-pay | Admitting: *Deleted

## 2016-11-05 DIAGNOSIS — I4892 Unspecified atrial flutter: Secondary | ICD-10-CM

## 2016-11-05 DIAGNOSIS — I5032 Chronic diastolic (congestive) heart failure: Secondary | ICD-10-CM

## 2016-11-05 DIAGNOSIS — I422 Other hypertrophic cardiomyopathy: Secondary | ICD-10-CM

## 2016-11-05 DIAGNOSIS — Z5181 Encounter for therapeutic drug level monitoring: Secondary | ICD-10-CM

## 2016-11-05 DIAGNOSIS — I110 Hypertensive heart disease with heart failure: Secondary | ICD-10-CM

## 2016-11-05 LAB — POCT INR: INR: 2.4

## 2016-11-05 MED ORDER — ARMODAFINIL 150 MG PO TABS: 150.0000 mg | ORAL_TABLET | Freq: Every day | ORAL | 0 refills | Status: DC | PRN

## 2016-11-05 MED ORDER — FUROSEMIDE 40 MG PO TABS: 40.0000 mg | ORAL_TABLET | ORAL | 1 refills | Status: DC

## 2016-11-05 NOTE — Patient Instructions (Signed)
Pre visit review using our clinic review tool, if applicable. No additional management support is needed unless otherwise documented below in the visit note. 

## 2016-11-05 NOTE — Telephone Encounter (Signed)
It's 1 daily as needed 150 mg  Rx refill called to [facility] x 1 only

## 2016-11-21 ENCOUNTER — Telehealth: Payer: Self-pay

## 2016-11-21 NOTE — Telephone Encounter (Signed)
Started PA for Armodafinil 150mg  tablet to [facility]: Pending review PA# [id number]. Will await response.

## 2016-12-02 ENCOUNTER — Telehealth: Payer: Self-pay

## 2016-12-02 NOTE — Telephone Encounter (Signed)
Routing to dr jones---patient has osteoporosis with history of fracture---are you ok with patient starting prolia injections---I will call patient and discuss---please advise, thanks

## 2016-12-03 ENCOUNTER — Ambulatory Visit (INDEPENDENT_AMBULATORY_CARE_PROVIDER_SITE_OTHER): Payer: Medicare Other | Admitting: General Practice

## 2016-12-03 DIAGNOSIS — Z5181 Encounter for therapeutic drug level monitoring: Secondary | ICD-10-CM | POA: Diagnosis not present

## 2016-12-03 DIAGNOSIS — I4892 Unspecified atrial flutter: Secondary | ICD-10-CM

## 2016-12-03 LAB — POCT INR: INR: 2.6

## 2016-12-03 NOTE — Patient Instructions (Signed)
Pre visit review using our clinic review tool, if applicable. No additional management support is needed unless otherwise documented below in the visit note. 

## 2016-12-03 NOTE — Telephone Encounter (Signed)
yes

## 2016-12-03 NOTE — Progress Notes (Signed)
I have reviewed and agree with the plan. 

## 2016-12-03 NOTE — Telephone Encounter (Signed)
Jonelle Sidle will be submitting information into insurance company for prolia injections, and will call patient to discuss

## 2016-12-17 ENCOUNTER — Encounter: Payer: Self-pay | Admitting: Pulmonary Disease

## 2016-12-17 ENCOUNTER — Ambulatory Visit (INDEPENDENT_AMBULATORY_CARE_PROVIDER_SITE_OTHER): Payer: Medicare Other | Admitting: Pulmonary Disease

## 2016-12-17 VITALS — BP 128/68 | HR 88 | Ht 63.0 in | Wt 181.0 lb

## 2016-12-17 DIAGNOSIS — G4730 Sleep apnea, unspecified: Secondary | ICD-10-CM

## 2016-12-17 DIAGNOSIS — R400 Somnolence: Secondary | ICD-10-CM

## 2016-12-17 DIAGNOSIS — J300 Vasomotor rhinitis: Secondary | ICD-10-CM

## 2016-12-17 DIAGNOSIS — G4733 Obstructive sleep apnea (adult) (pediatric): Secondary | ICD-10-CM | POA: Diagnosis not present

## 2016-12-17 DIAGNOSIS — G4710 Hypersomnia, unspecified: Secondary | ICD-10-CM

## 2016-12-17 MED ORDER — ARMODAFINIL 150 MG PO TABS: 150.0000 mg | ORAL_TABLET | Freq: Every day | ORAL | 5 refills | Status: DC

## 2016-12-17 MED ORDER — IPRATROPIUM BROMIDE 0.03 % NA SOLN: 2.0000 | Freq: Three times a day (TID) | NASAL | 2 refills | Status: DC | PRN

## 2016-12-17 NOTE — Patient Instructions (Signed)
Call if you find different type of CPAP mask you would like to try  Follow up in 1 year

## 2016-12-17 NOTE — Progress Notes (Signed)
Current Outpatient Prescriptions on File Prior to Visit  Medication Sig  . allopurinol (ZYLOPRIM) 100 MG tablet Take 1 tablet (100 mg total) by mouth daily.  Marland Kitchen ampicillin (PRINCIPEN) 500 MG capsule Take 500 mg by mouth daily as needed (for dental work only.).  Marland Kitchen Armodafinil 150 MG tablet Take 1 tablet (150 mg total) by mouth daily as needed (for driving).  . B Complex-C (B-COMPLEX WITH VITAMIN C) tablet Take 1 tablet by mouth daily.  . Betamethasone Valerate 0.12 % foam APPLY TO SCALP AS DIRECTED  . buPROPion (WELLBUTRIN XL) 150 MG 24 hr tablet TAKE 1 TABLET (150 MG TOTAL) BY MOUTH DAILY.  Marland Kitchen Cholecalciferol 2000 UNITS TABS Take 1 tablet (2,000 Units total) by mouth daily.  . ciclopirox (LOPROX) 0.77 % SUSP Apply 1 Act topically 2 (two) times daily.  . fexofenadine (ALLEGRA) 180 MG tablet Take 180 mg by mouth daily as needed for allergies.   . furosemide (LASIX) 40 MG tablet Take 1 tablet (40 mg total) by mouth every other day.  . hydrocortisone (ANUSOL-HC) 25 MG suppository Place 25 mg rectally daily as needed for hemorrhoids.  Marland Kitchen lamoTRIgine (LAMICTAL) 25 MG tablet Take 1 tablet (25 mg total) by mouth daily.  Marland Kitchen linagliptin (TRADJENTA) 5 MG TABS tablet Take 1 tablet (5 mg total) by mouth daily.  . metoprolol succinate (TOPROL-XL) 100 MG 24 hr tablet Take 1 tablet (100 mg total) by mouth daily. Take with or immediately following a meal.  . PRESCRIPTION MEDICATION CPAP  . ranitidine (ZANTAC) 300 MG tablet TAKE 1 TABLET (300 MG TOTAL) BY MOUTH AT BEDTIME.  Marland Kitchen spironolactone (ALDACTONE) 25 MG tablet TAKE 1 TABLET (25 MG TOTAL) BY MOUTH EVERY OTHER DAY.  . SYMBICORT 80-4.5 MCG/ACT inhaler INHALE 2 PUFFS INTO THE LUNGS 2 (TWO) TIMES DAILY.  Marland Kitchen triamcinolone cream (KENALOG) 0.5 % Apply 1 application topically 3 (three) times daily as needed (itching).   Marland Kitchen VIIBRYD 40 MG TABS TAKE 1 TABLET (40 MG TOTAL) BY MOUTH DAILY.  Marland Kitchen warfarin (COUMADIN) 2.5 MG tablet Take as directed by coumadin clinic  .  [DISCONTINUED] Alum & Mag Hydroxide-Simeth (MAGIC MOUTHWASH) SOLN Take 5 mLs by mouth 3 (three) times daily. 14 days then stop    No current facility-administered medications on file prior to visit.      Chief Complaint  Patient presents with  . Follow-up    f/u OSA, pt reports she is having coughing spells, bloody nose, and sneezing      Sleep tests PSG 06/05/07 >> AHI 6, SpO2 low 77% HST 05/15/15 >> AHI 29, SaO2 low 70% Auto CPAP 09/18/16 to 12/16/16 >> used on 56 of 90 nights with average 6 hrs 39 min.  Average AHI 0.9 with median CPAP 8 and 95 th percentile CPAP 11 cm H2O  Cardiac tests Echo 03/26/16 >> EF 55%, mild AS, mild/mod MR  Past medical history HTN, Diastolic CHF, s/p Tricuspid valve replacement, CHB s/p PM, A flutter, Depression, CKD, GERD, Psoriasis, Gout, Asthma  Past surgical history, Family history, Social history, Allergies reviewed  Vital Signs BP 128/68 (BP Location: Left Arm, Cuff Size: Normal)   Pulse 88   Ht 5\' 3"  (1.6 m)   Wt 181 lb (82.1 kg)   SpO2 90%   BMI 32.06 kg/m   History of Present Illness Holly Simmons is a 76 y.o. female with obstructive sleep apnea with persistent daytime hypersomnolence.  She uses her CPAP most nights.  She has full face face.  She tried smaller  masks, but didn't fit well.  She feels CPAP helps.  She sometimes falls asleep before putting CPAP on.  She also has trouble with nose bleeds and sinus congestion.  Atrovent nose spray helps with this.  She uses this few times per month.  She still has trouble staying awake while driving, and during situations in which she has to maintain concentration.  She has used nuvigil, and this helps.  She is having trouble with insurance coverage for this.  Physical Exam  General - pleasant Eyes - pupils reactive ENT - no sinus tenderness, no oral exudate, no LAN Cardiac - regular, no murmur Chest - no wheeze, rales Abd - soft, non tender Ext - no edema Skin - no rashes Neuro  - normal strength Psych - normal mood   Assessment/Plan  Obstructive sleep apnea. 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 - will refill her nuvigil - discussed when she should use stimulant medication  Vasomotor rhinitis. - continue prn atrovent nasal spray   Patient Instructions  Call if you find different type of CPAP mask you would like to try  Follow up in 1 year   Time spent 27 minutes  [name], MD [place] [facility] Pager:  [phone] 12/17/2016, 11:11 AM

## 2016-12-30 ENCOUNTER — Ambulatory Visit (INDEPENDENT_AMBULATORY_CARE_PROVIDER_SITE_OTHER): Payer: Medicare Other | Admitting: *Deleted

## 2016-12-30 DIAGNOSIS — I495 Sick sinus syndrome: Secondary | ICD-10-CM

## 2016-12-30 DIAGNOSIS — I442 Atrioventricular block, complete: Secondary | ICD-10-CM

## 2016-12-30 DIAGNOSIS — I4892 Unspecified atrial flutter: Secondary | ICD-10-CM | POA: Diagnosis not present

## 2016-12-31 LAB — CUP PACEART REMOTE DEVICE CHECK
Battery Impedance: 258 Ohm
Battery Remaining Longevity: 110 mo
Brady Statistic AS VP Percent: 91 %
Date Time Interrogation Session: 20180529121247
Implantable Lead Implant Date: 20131210
Implantable Lead Location: 753858
Implantable Lead Location: 753859
Implantable Lead Model: 5076
Implantable Pulse Generator Implant Date: 20131210
Lead Channel Pacing Threshold Amplitude: 0.75 V
Lead Channel Pacing Threshold Amplitude: 0.875 V
Lead Channel Pacing Threshold Pulse Width: 0.4 ms
Lead Channel Setting Pacing Amplitude: 2 V
Lead Channel Setting Pacing Amplitude: 2.5 V
Lead Channel Setting Sensing Sensitivity: 8 mV
MDC IDC LEAD IMPLANT DT: 20131210
MDC IDC MSMT BATTERY VOLTAGE: 2.79 V
MDC IDC MSMT LEADCHNL RA IMPEDANCE VALUE: 487 Ohm
MDC IDC MSMT LEADCHNL RA PACING THRESHOLD PULSEWIDTH: 0.4 ms
MDC IDC MSMT LEADCHNL RV IMPEDANCE VALUE: 958 Ohm
MDC IDC SET LEADCHNL RV PACING PULSEWIDTH: 0.4 ms
MDC IDC STAT BRADY AP VP PERCENT: 9 %
MDC IDC STAT BRADY AP VS PERCENT: 0 %
MDC IDC STAT BRADY AS VS PERCENT: 0 %

## 2017-01-02 ENCOUNTER — Telehealth: Payer: Self-pay | Admitting: Cardiovascular Disease

## 2017-01-02 DIAGNOSIS — I361 Nonrheumatic tricuspid (valve) insufficiency: Secondary | ICD-10-CM

## 2017-01-02 NOTE — Telephone Encounter (Signed)
Called patient and scheduled patient for Echo and office visit for same day in August.

## 2017-01-02 NOTE — Telephone Encounter (Signed)
New message    Pt wants to know when your going to order the Echo for her to schedule.

## 2017-01-07 ENCOUNTER — Ambulatory Visit (INDEPENDENT_AMBULATORY_CARE_PROVIDER_SITE_OTHER): Payer: Medicare Other | Admitting: General Practice

## 2017-01-07 DIAGNOSIS — Z5181 Encounter for therapeutic drug level monitoring: Secondary | ICD-10-CM

## 2017-01-07 DIAGNOSIS — I4892 Unspecified atrial flutter: Secondary | ICD-10-CM

## 2017-01-07 LAB — POCT INR: INR: 3.2

## 2017-01-07 NOTE — Patient Instructions (Signed)
Pre visit review using our clinic review tool, if applicable. No additional management support is needed unless otherwise documented below in the visit note. 

## 2017-01-07 NOTE — Progress Notes (Signed)
I have reviewed and agree with the plan. 

## 2017-01-09 ENCOUNTER — Encounter: Payer: Self-pay | Admitting: Cardiology

## 2017-01-09 ENCOUNTER — Telehealth: Payer: Self-pay | Admitting: Pulmonary Disease

## 2017-01-09 NOTE — Telephone Encounter (Signed)
Received PA request for armodafinil 150mg  tablets. PA was started on cmm. Key is [id number]. Will route to Ashtyn for follow up.

## 2017-01-12 NOTE — Telephone Encounter (Signed)
What are the alternative medications?

## 2017-01-12 NOTE — Telephone Encounter (Signed)
Checked status of PA on [email or web address], PA has been denied.  If we wish to appeal, we must call [phone] to initiate appeal.  VS please advise if you'd like to proceed with an appeal or if you'd like to switch to an alternative rx.  Thanks!

## 2017-01-12 NOTE — Telephone Encounter (Signed)
I have talked with patient about starting prolia---patient would like to think about it----if she decides yes---insurance has been verified for this year 2018---and estimated copay due is $70, no PA needed---can talk with Kerney Hopfensperger if any further questions

## 2017-01-13 NOTE — Telephone Encounter (Signed)
Called [employer]. There no covered alternatives for Armodafinil 150mg .  VS - please advise. Thanks.

## 2017-01-14 ENCOUNTER — Ambulatory Visit (INDEPENDENT_AMBULATORY_CARE_PROVIDER_SITE_OTHER): Payer: Medicare Other

## 2017-01-14 DIAGNOSIS — M810 Age-related osteoporosis without current pathological fracture: Secondary | ICD-10-CM | POA: Diagnosis not present

## 2017-01-14 MED ORDER — DENOSUMAB 60 MG/ML ~~LOC~~ SOLN
60.0000 mg | Freq: Once | SUBCUTANEOUS | Status: AC
  Administered 2017-01-14: 60 mg via SUBCUTANEOUS

## 2017-01-14 NOTE — Telephone Encounter (Signed)
Please call to initiate prior authorization.

## 2017-01-14 NOTE — Telephone Encounter (Signed)
Called the [phone] and spoke with rep  She is going to be faxing over information on appeals process  Will await fax

## 2017-01-15 NOTE — Telephone Encounter (Signed)
Received denial letter from [employer] stating that the armodafinil was denied due to the patient not having a documented trial and failure or intolerance to generic modafinil.   The appeals letter needs to state why the medications on the formulary are not effective (modafinil) for the patient.   Letter has been placed in Ashtyn's lookat folder for reference.

## 2017-01-15 NOTE — Telephone Encounter (Signed)
Still not received  Called again and placed on a long hold  [facility]

## 2017-01-16 MED ORDER — MODAFINIL 100 MG PO TABS: 100.0000 mg | ORAL_TABLET | Freq: Every day | ORAL | 5 refills | Status: DC

## 2017-01-16 NOTE — Telephone Encounter (Signed)
LMTCB for the pt 

## 2017-01-16 NOTE — Telephone Encounter (Signed)
Pt returned phone call..ert ° ° °

## 2017-01-16 NOTE — Telephone Encounter (Signed)
Not sure why when insurance company was asked by [place] before about alternative to armodafinil they didn't mention that modafinil was on the formula.  Anyway.  She can be changed to modafinil 100 mg daily.

## 2017-01-16 NOTE — Telephone Encounter (Signed)
Spoke with the pt and notified of med change  Rx was called to [facility]

## 2017-01-20 ENCOUNTER — Telehealth: Payer: Self-pay

## 2017-01-20 NOTE — Telephone Encounter (Signed)
I started PA on Modafinil 150 but didn't realize it was already done and medication had been changed. Please disregard any information the insurance company sends. Thanks. See previous telephone note.

## 2017-01-21 ENCOUNTER — Ambulatory Visit (INDEPENDENT_AMBULATORY_CARE_PROVIDER_SITE_OTHER): Payer: Medicare Other | Admitting: General Practice

## 2017-01-21 DIAGNOSIS — Z5181 Encounter for therapeutic drug level monitoring: Secondary | ICD-10-CM

## 2017-01-21 DIAGNOSIS — I4892 Unspecified atrial flutter: Secondary | ICD-10-CM

## 2017-01-21 LAB — POCT INR: INR: 2.5

## 2017-01-21 NOTE — Patient Instructions (Signed)
Pre visit review using our clinic review tool, if applicable. No additional management support is needed unless otherwise documented below in the visit note. 

## 2017-01-21 NOTE — Progress Notes (Signed)
I have reviewed and agree with the plan. 

## 2017-01-23 NOTE — Telephone Encounter (Signed)
Received rejection letter from [facility] regarding the Modafinil 100mg , stating that it was not covered by insurance. [facility] and was advised that the Modafinil 100mg  has been approved for $9.00.  Pharmacist states that he is unsure why we received kick back of rejection. Nothing further needed.

## 2017-02-25 ENCOUNTER — Ambulatory Visit (INDEPENDENT_AMBULATORY_CARE_PROVIDER_SITE_OTHER): Payer: Medicare Other | Admitting: General Practice

## 2017-02-25 DIAGNOSIS — Z5181 Encounter for therapeutic drug level monitoring: Secondary | ICD-10-CM | POA: Diagnosis not present

## 2017-02-25 DIAGNOSIS — I4892 Unspecified atrial flutter: Secondary | ICD-10-CM

## 2017-02-25 LAB — POCT INR: INR: 2.4

## 2017-02-25 NOTE — Patient Instructions (Signed)
Pre visit review using our clinic review tool, if applicable. No additional management support is needed unless otherwise documented below in the visit note. 

## 2017-02-25 NOTE — Progress Notes (Signed)
I have reviewed and agree with the plan. 

## 2017-02-26 ENCOUNTER — Other Ambulatory Visit: Payer: Self-pay | Admitting: Internal Medicine

## 2017-02-26 DIAGNOSIS — F418 Other specified anxiety disorders: Secondary | ICD-10-CM

## 2017-03-17 NOTE — Progress Notes (Signed)
Patient ID: Holly Simmons, female   DOB: [birth year], 76 y.o.   MRN: [id number]   She has a fairly complex history. She has a history of HOCM and underwent surgery at [facility] 06/23/11  She was seen in followup by Dr. Rayann Heman 11/15/11 . 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 She underwent cardioversion for atrial flutter and was placed on Coumadin and amiodarone. Epicardial leads are known to have a high threshold.  Echocardiogram 08/27/11: Status post septal myomectomy complicated by VSD now with VSD patch repair-patch covers the basal septal wall and leads to akinesis of the wall within that area, small to moderate residual VSD at the apical end of the patch, EF 50%, moderate diastolic dysfunction, no LVOT or SAM, trivial MR, TR with mildly elevated mean gradient.  Maintaining sinus rhythm and her amiodarone and Coumadin were both discontinued.  Subsequently had new pacer placed 1/15 . Could not cross TVR for stable lead but got good threshholds in CS.   12/15 found to be in flutter on pacer interrogation  [facility] with DR Nahser  08/01/14   Coumadin Rx  CHADVASC 3   Reviewed echo from 09/13/14  and EF ok no LVOT gradient No residual VSD  Gradients across TV a bit high    Seen by Allred  09/26/15 and pacer interrogation indicated afib since 05/2015  Rate control and anticoagulation Decided on since patient unaware of arrhythmia  Echo reviewed 03/26/16 Impressions:  - The patient appeared to be in atrial flutter. Normal LV size with   EF 55%. The basal to mid inferoseptal wall and basal anteroseptal   wall were severely hypokinetic to akinetic. There did appear to   be surgical patch material in the basal inferoseptal/anteroseptal   wall. There was no residual VSD visualized. The right ventricle   was  normal in size with mildly decreased systolic function. Mild   aortic stenosis. Mild-moderate mitral regurgitation. There was a   bioprosthetic tricuspid valve with mildly increased mean gradient   at 5 mmHg (prior 7 mmHg). There was mild TR.  Echo 03/19/17 reviewed and no change VSD looks like it has a discontinuity in it but no color flow TV no change in gradients Mild MR and mild AS  Spoke with her son and she alternates lasix and aldactone LE edema from bad varicose veins   ROS: Denies fever, malais, weight loss, blurry vision, decreased visual acuity, cough, sputum, SOB, hemoptysis, pleuritic pain, palpitaitons, heartburn, abdominal pain, melena, lower extremity edema, claudication, or rash.  All other systems reviewed and negative  General: BP 134/78   Pulse 93   Ht 5\' 3"  (1.6 m)   Wt 178 lb 4 oz (80.9 kg)   SpO2 94%   BMI 31.58 kg/m  Affect appropriate Healthy:  appears stated age 14: normal Neck supple with no adenopathy JVP normal no bruits no thyromegaly Lungs clear with no wheezing and good diaphragmatic motion Heart:  S1/S2 SEM  murmur, no rub, gallop or click PMI normal Abdomen: benighn, BS positve, no tenderness, no AAA no bruit.  No HSM or HJR Distal pulses intact with no bruits Plus one bilateral  Edema with bad varicose veins bilaterally Neuro non-focal Skin warm and dry No muscular weakness    Current Outpatient Prescriptions  Medication Sig Dispense Refill  . allopurinol (  ZYLOPRIM) 100 MG tablet Take 1 tablet (100 mg total) by mouth daily. 90 tablet 2  . ampicillin (PRINCIPEN) 500 MG capsule Take 500 mg by mouth daily as needed (for dental work only.).    Marland Kitchen B Complex-C (B-COMPLEX WITH VITAMIN C) tablet Take 1 tablet by mouth daily.    . Betamethasone Valerate 0.12 % foam APPLY TO SCALP AS DIRECTED 100 g 1  . buPROPion (WELLBUTRIN XL) 150 MG 24 hr tablet TAKE 1 TABLET (150 MG TOTAL) BY MOUTH DAILY. 90 tablet 2  . Cholecalciferol 2000 UNITS TABS Take 1  tablet (2,000 Units total) by mouth daily. 90 tablet 3  . ciclopirox (LOPROX) 0.77 % SUSP Apply 1 Act topically 2 (two) times daily. 60 mL 2  . fexofenadine (ALLEGRA) 180 MG tablet Take 180 mg by mouth daily as needed for allergies.     . furosemide (LASIX) 40 MG tablet Take 1 tablet (40 mg total) by mouth every other day. 45 tablet 1  . hydrocortisone (ANUSOL-HC) 25 MG suppository Place 25 mg rectally daily as needed for hemorrhoids.    Marland Kitchen ipratropium (ATROVENT) 0.03 % nasal spray Place 2 sprays into both nostrils 3 (three) times daily as needed for rhinitis. 30 mL 2  . lamoTRIgine (LAMICTAL) 25 MG tablet TAKE 1 TABLET ONCE DAILY. 90 tablet 1  . linagliptin (TRADJENTA) 5 MG TABS tablet Take 1 tablet (5 mg total) by mouth daily. 90 tablet 2  . metoprolol succinate (TOPROL-XL) 100 MG 24 hr tablet Take 1 tablet (100 mg total) by mouth daily. Take with or immediately following a meal. 90 tablet 2  . modafinil (PROVIGIL) 100 MG tablet Take 1 tablet (100 mg total) by mouth daily. 30 tablet 5  . PRESCRIPTION MEDICATION CPAP    . ranitidine (ZANTAC) 300 MG tablet TAKE 1 TABLET (300 MG TOTAL) BY MOUTH AT BEDTIME. 90 tablet 2  . spironolactone (ALDACTONE) 25 MG tablet TAKE 1 TABLET (25 MG TOTAL) BY MOUTH EVERY OTHER DAY. 45 tablet 1  . SYMBICORT 80-4.5 MCG/ACT inhaler INHALE 2 PUFFS INTO THE LUNGS 2 (TWO) TIMES DAILY. 10.2 Inhaler 10  . triamcinolone cream (KENALOG) 0.5 % Apply 1 application topically 3 (three) times daily as needed (itching).     Marland Kitchen VIIBRYD 40 MG TABS TAKE 1 TABLET (40 MG TOTAL) BY MOUTH DAILY. 90 tablet 2  . warfarin (COUMADIN) 2.5 MG tablet Take as directed by coumadin clinic 40 tablet 3   No current facility-administered medications for this visit.     Allergies  Sulfonamide derivatives; Tetanus toxoids; Other; and Nsaids  Electrocardiogram:  AV paced rate 79  8/15 12/23  Flutter rate 85  RBBB paced   Flutter with V pacing rate 60  RBBB pattern 03/26/16   Assessment and  Plan  HOCM: post myectomy with complication LVOT gradient gone now VSD sealed post MI/myectomy complication Pacer  Thresholds ok f/u [facility] ? Change to VVIR mode  TV replacement  F/u echo mild TR no change in mean gradient 7 mmHg  Anticoagulation  For PAF  No bleeding issues f/u coumadin clinic Afib:  Seen by Dr Rayann Heman  09/26/15 pacer indicated been in afib since October 2016 decided To rate control and anticoagulate since patient is unaware of arrhythmia.  Given valvular afib on coumadin and not NOAC   Jenkins Rouge

## 2017-03-18 ENCOUNTER — Ambulatory Visit (INDEPENDENT_AMBULATORY_CARE_PROVIDER_SITE_OTHER): Payer: Medicare Other | Admitting: Internal Medicine

## 2017-03-18 ENCOUNTER — Encounter: Payer: Self-pay | Admitting: Internal Medicine

## 2017-03-18 ENCOUNTER — Other Ambulatory Visit (INDEPENDENT_AMBULATORY_CARE_PROVIDER_SITE_OTHER): Payer: Self-pay

## 2017-03-18 VITALS — BP 136/72 | HR 79 | Temp 98.4°F | Ht 63.0 in | Wt 180.0 lb

## 2017-03-18 DIAGNOSIS — E118 Type 2 diabetes mellitus with unspecified complications: Secondary | ICD-10-CM | POA: Diagnosis not present

## 2017-03-18 DIAGNOSIS — I422 Other hypertrophic cardiomyopathy: Secondary | ICD-10-CM

## 2017-03-18 DIAGNOSIS — E669 Obesity, unspecified: Secondary | ICD-10-CM | POA: Diagnosis not present

## 2017-03-18 DIAGNOSIS — R778 Other specified abnormalities of plasma proteins: Secondary | ICD-10-CM

## 2017-03-18 LAB — COMPREHENSIVE METABOLIC PANEL
ALK PHOS: 126 U/L — AB (ref 39–117)
ALT: 8 U/L (ref 0–35)
AST: 15 U/L (ref 0–37)
Albumin: 4.3 g/dL (ref 3.5–5.2)
BUN: 21 mg/dL (ref 6–23)
CALCIUM: 9.9 mg/dL (ref 8.4–10.5)
CO2: 36 mEq/L — ABNORMAL HIGH (ref 19–32)
Chloride: 101 mEq/L (ref 96–112)
Creatinine, Ser: 1.45 mg/dL — ABNORMAL HIGH (ref 0.40–1.20)
GFR: 37.55 mL/min — ABNORMAL LOW (ref >60.00)
GLUCOSE: 104 mg/dL — AB (ref 70–99)
Potassium: 4 mEq/L (ref 3.5–5.1)
Sodium: 139 mEq/L (ref 135–145)
TOTAL PROTEIN: 7.3 g/dL (ref 6.0–8.3)
Total Bilirubin: 0.7 mg/dL (ref 0.2–1.2)

## 2017-03-18 LAB — HEMOGLOBIN A1C: HEMOGLOBIN A1C: 6.3 % (ref 4.6–6.5)

## 2017-03-18 NOTE — Patient Instructions (Signed)

## 2017-03-18 NOTE — Progress Notes (Signed)
Subjective:  Patient ID: Holly Simmons, female    DOB: 1941/05/07  Age: 76 y.o. MRN: [id number]  CC: Diabetes   HPI LENER VENTRESCA presents for f/up - her blood sugars have been well controlled. She feels well today.  Outpatient Medications Prior to Visit  Medication Sig Dispense Refill  . allopurinol (ZYLOPRIM) 100 MG tablet Take 1 tablet (100 mg total) by mouth daily. 90 tablet 2  . ampicillin (PRINCIPEN) 500 MG capsule Take 500 mg by mouth daily as needed (for dental work only.).    Marland Kitchen B Complex-C (B-COMPLEX WITH VITAMIN C) tablet Take 1 tablet by mouth daily.    . Betamethasone Valerate 0.12 % foam APPLY TO SCALP AS DIRECTED 100 g 1  . buPROPion (WELLBUTRIN XL) 150 MG 24 hr tablet TAKE 1 TABLET (150 MG TOTAL) BY MOUTH DAILY. 90 tablet 2  . Cholecalciferol 2000 UNITS TABS Take 1 tablet (2,000 Units total) by mouth daily. 90 tablet 3  . ciclopirox (LOPROX) 0.77 % SUSP Apply 1 Act topically 2 (two) times daily. 60 mL 2  . fexofenadine (ALLEGRA) 180 MG tablet Take 180 mg by mouth daily as needed for allergies.     . furosemide (LASIX) 40 MG tablet Take 1 tablet (40 mg total) by mouth every other day. 45 tablet 1  . hydrocortisone (ANUSOL-HC) 25 MG suppository Place 25 mg rectally daily as needed for hemorrhoids.    Marland Kitchen ipratropium (ATROVENT) 0.03 % nasal spray Place 2 sprays into both nostrils 3 (three) times daily as needed for rhinitis. 30 mL 2  . lamoTRIgine (LAMICTAL) 25 MG tablet TAKE 1 TABLET ONCE DAILY. 90 tablet 1  . linagliptin (TRADJENTA) 5 MG TABS tablet Take 1 tablet (5 mg total) by mouth daily. 90 tablet 2  . metoprolol succinate (TOPROL-XL) 100 MG 24 hr tablet Take 1 tablet (100 mg total) by mouth daily. Take with or immediately following a meal. 90 tablet 2  . modafinil (PROVIGIL) 100 MG tablet Take 1 tablet (100 mg total) by mouth daily. 30 tablet 5  . PRESCRIPTION MEDICATION CPAP    . ranitidine (ZANTAC) 300 MG tablet TAKE 1 TABLET (300 MG TOTAL) BY MOUTH AT  BEDTIME. 90 tablet 2  . spironolactone (ALDACTONE) 25 MG tablet TAKE 1 TABLET (25 MG TOTAL) BY MOUTH EVERY OTHER DAY. 45 tablet 1  . SYMBICORT 80-4.5 MCG/ACT inhaler INHALE 2 PUFFS INTO THE LUNGS 2 (TWO) TIMES DAILY. 10.2 Inhaler 10  . triamcinolone cream (KENALOG) 0.5 % Apply 1 application topically 3 (three) times daily as needed (itching).     Marland Kitchen VIIBRYD 40 MG TABS TAKE 1 TABLET (40 MG TOTAL) BY MOUTH DAILY. 90 tablet 2  . warfarin (COUMADIN) 2.5 MG tablet Take as directed by coumadin clinic 40 tablet 3   No facility-administered medications prior to visit.     ROS Review of Systems  Constitutional: Negative for diaphoresis, fatigue and unexpected weight change.  HENT: Negative.  Negative for trouble swallowing.   Eyes: Negative for visual disturbance.  Respiratory: Negative for cough, chest tightness, shortness of breath and wheezing.   Cardiovascular: Negative for chest pain, palpitations and leg swelling.  Gastrointestinal: Negative for abdominal pain, blood in stool, constipation, diarrhea, nausea and vomiting.  Endocrine: Negative.   Genitourinary: Negative.  Negative for decreased urine volume, difficulty urinating, dysuria and urgency.  Musculoskeletal: Negative.   Skin: Negative.     Objective:  BP 136/72 (BP Location: Left Arm, Patient Position: Sitting, Cuff Size: Normal)   Pulse 79  Temp 98.4 F (36.9 C) (Oral)   Ht 5\' 3"  (1.6 m)   Wt 180 lb (81.6 kg)   SpO2 98%   BMI 31.89 kg/m   BP Readings from Last 3 Encounters:  03/18/17 136/72  12/17/16 128/68  09/26/16 120/64    Wt Readings from Last 3 Encounters:  03/18/17 180 lb (81.6 kg)  12/17/16 181 lb (82.1 kg)  09/26/16 179 lb 3.2 oz (81.3 kg)    Physical Exam  Constitutional: She is oriented to person, place, and time. No distress.  HENT:  Mouth/Throat: Oropharynx is clear and moist. No oropharyngeal exudate.  Eyes: Conjunctivae are normal. Right eye exhibits no discharge. Left eye exhibits no discharge.  No scleral icterus.  Neck: Normal range of motion. Neck supple. No JVD present. No thyromegaly present.  Cardiovascular: Normal rate, regular rhythm, S1 normal and S2 normal.  Exam reveals gallop and S4. Exam reveals no S3 and no friction rub.   Murmur heard.  Systolic murmur is present with a grade of 1/6   Diastolic murmur is present with a grade of 1/6  Pulmonary/Chest: Effort normal and breath sounds normal. No respiratory distress. She has no wheezes. She has no rales. She exhibits no tenderness.  Abdominal: Soft. Bowel sounds are normal. She exhibits no distension and no mass. There is no tenderness. There is no rebound and no guarding.  Musculoskeletal: Normal range of motion. She exhibits no edema, tenderness or deformity.  Lymphadenopathy:    She has no cervical adenopathy.  Neurological: She is alert and oriented to person, place, and time.  Skin: Skin is warm and dry. No rash noted. She is not diaphoretic. No erythema. No pallor.  Vitals reviewed.   Lab Results  Component Value Date   WBC 7.8 09/17/2016   HGB 12.7 09/17/2016   HCT 38.3 09/17/2016   PLT 149.0 (L) 09/17/2016   GLUCOSE 104 (H) 03/18/2017   CHOL 139 09/17/2016   TRIG 77.0 09/17/2016   HDL 53.80 09/17/2016   LDLCALC 70 09/17/2016   ALT 8 03/18/2017   AST 15 03/18/2017   NA 139 03/18/2017   K 4.0 03/18/2017   CL 101 03/18/2017   CREATININE 1.45 (H) 03/18/2017   BUN 21 03/18/2017   CO2 36 (H) 03/18/2017   TSH 2.92 09/17/2016   INR 2.4 02/25/2017   HGBA1C 6.3 03/18/2017   MICROALBUR 22.8 (H) 06/04/2016    US Breast Ltd Uni Left Inc Axilla  Result Date: 03/26/2016 CLINICAL DATA:  Two year follow-up of a medial left breast mass EXAM: 2D DIGITAL DIAGNOSTIC BILATERAL MAMMOGRAM WITH CAD AND ADJUNCT TOMO ULTRASOUND LEFT BREAST COMPARISON:  Previous exam(s). ACR Breast Density Category b: There are scattered areas of fibroglandular density. FINDINGS: The medial left breast mass is unchanged mammographically. No  mammographic changes or evidence of malignancy. Mammographic images were processed with CAD. On physical exam, no suspicious lumps are identified. Targeted ultrasound is performed, showing a 5 x 3 x 5 mm cluster of cysts in the left breast at 9 o'clock, 3 cm from the nipple, unchanged in the interval. IMPRESSION: The medial left breast mass has been unchanged for 2 years requiring no additional follow-up. No mammographic or sonographic evidence of malignancy. RECOMMENDATION: Annual screening mammography I have discussed the findings and recommendations with the patient. Results were also provided in writing at the conclusion of the visit. If applicable, a reminder letter will be sent to the patient regarding the next appointment. BI-RADS CATEGORY  2: Benign. Electronically Signed  By: Dorise Bullion III M.D   On: 03/26/2016 11:29   Mm Diag Breast Tomo Bilateral  Result Date: 03/26/2016 CLINICAL DATA:  Two year follow-up of a medial left breast mass EXAM: 2D DIGITAL DIAGNOSTIC BILATERAL MAMMOGRAM WITH CAD AND ADJUNCT TOMO ULTRASOUND LEFT BREAST COMPARISON:  Previous exam(s). ACR Breast Density Category b: There are scattered areas of fibroglandular density. FINDINGS: The medial left breast mass is unchanged mammographically. No mammographic changes or evidence of malignancy. Mammographic images were processed with CAD. On physical exam, no suspicious lumps are identified. Targeted ultrasound is performed, showing a 5 x 3 x 5 mm cluster of cysts in the left breast at 9 o'clock, 3 cm from the nipple, unchanged in the interval. IMPRESSION: The medial left breast mass has been unchanged for 2 years requiring no additional follow-up. No mammographic or sonographic evidence of malignancy. RECOMMENDATION: Annual screening mammography I have discussed the findings and recommendations with the patient. Results were also provided in writing at the conclusion of the visit. If applicable, a reminder letter will be sent to  the patient regarding the next appointment. BI-RADS CATEGORY  2: Benign. Electronically Signed   By: Dorise Bullion III M.D   On: 03/26/2016 11:29    Assessment & Plan:   Anilah was seen today for diabetes.  Diagnoses and all orders for this visit:  Type 2 diabetes mellitus with complication, without long-term current use of insulin ([place])- Her A1c is 6.3%. Her blood sugars are adequately well controlled. We'll continue the DPP4 inhibitor. -     Comprehensive metabolic panel; Future -     Hemoglobin A1c; Future  Elevated total protein- her total protein is normal now and she has no symptoms related to this. -     Comprehensive metabolic panel; Future  Obesity (BMI 30.0-34.9)- she agrees to work on her lifestyle modifications to lose weight.  Hypertrophic cardiomyopathy ([place])- she has a new S4 today. She is not symptomatic and has a normal fluid status. She has an upcoming appointment soon with cardiology to have this evaluated. -     Comprehensive metabolic panel; Future   I am having Ms. Hoston maintain her fexofenadine, B-complex with vitamin C, hydrocortisone, triamcinolone cream, ampicillin, Cholecalciferol, SYMBICORT, buPROPion, Betamethasone Valerate, PRESCRIPTION MEDICATION, VIIBRYD, ranitidine, spironolactone, warfarin, allopurinol, metoprolol succinate, linagliptin, ciclopirox, furosemide, ipratropium, modafinil, and lamoTRIgine.  No orders of the defined types were placed in this encounter.    Follow-up: Return in about 6 months (around 09/18/2017).  Scarlette Calico, MD

## 2017-03-19 ENCOUNTER — Other Ambulatory Visit: Payer: Self-pay

## 2017-03-19 ENCOUNTER — Ambulatory Visit (INDEPENDENT_AMBULATORY_CARE_PROVIDER_SITE_OTHER): Payer: Medicare Other | Admitting: Cardiovascular Disease

## 2017-03-19 ENCOUNTER — Ambulatory Visit (HOSPITAL_COMMUNITY): Payer: Medicare Other | Attending: Cardiology

## 2017-03-19 ENCOUNTER — Encounter: Payer: Self-pay | Admitting: Cardiovascular Disease

## 2017-03-19 VITALS — BP 134/78 | HR 93 | Ht 63.0 in | Wt 178.2 lb

## 2017-03-19 DIAGNOSIS — I361 Nonrheumatic tricuspid (valve) insufficiency: Secondary | ICD-10-CM

## 2017-03-19 DIAGNOSIS — I080 Rheumatic disorders of both mitral and aortic valves: Secondary | ICD-10-CM | POA: Insufficient documentation

## 2017-03-19 DIAGNOSIS — I421 Obstructive hypertrophic cardiomyopathy: Secondary | ICD-10-CM | POA: Diagnosis not present

## 2017-03-19 NOTE — Patient Instructions (Signed)
Medication Instructions:  Your physician recommends that you continue on your current medications as directed. Please refer to the Current Medication list given to you today.   Labwork: NONE ORDERED  Testing/Procedures: NONE ORDERED   Follow-Up: 1. Your physician wants you to follow-up in: [place] DR. ALLRED You will receive a reminder letter in the mail two months in advance. If you don't receive a letter, please call our office to schedule the follow-up appointment.  2. Your physician wants you to follow-up in: 1 YEAR WITH DR. Blima Singer will receive a reminder letter in the mail two months in advance. If you don't receive a letter, please call our office to schedule the follow-up appointment.  Any Other Special Instructions Will Be Listed Below (If Applicable).     If you need a refill on your cardiac medications before your next appointment, please call your pharmacy.

## 2017-03-24 NOTE — Patient Instructions (Signed)
Pre visit review using our clinic review tool, if applicable. No additional management support is needed unless otherwise documented below in the visit note. 

## 2017-03-25 ENCOUNTER — Ambulatory Visit (INDEPENDENT_AMBULATORY_CARE_PROVIDER_SITE_OTHER): Payer: Medicare Other | Admitting: General Practice

## 2017-03-25 ENCOUNTER — Telehealth: Payer: Self-pay

## 2017-03-25 DIAGNOSIS — I4892 Unspecified atrial flutter: Secondary | ICD-10-CM | POA: Diagnosis not present

## 2017-03-25 DIAGNOSIS — Z5181 Encounter for therapeutic drug level monitoring: Secondary | ICD-10-CM

## 2017-03-25 DIAGNOSIS — Z1239 Encounter for other screening for malignant neoplasm of breast: Secondary | ICD-10-CM

## 2017-03-25 LAB — POCT INR
INR: 2.2
INR: 2.7

## 2017-03-25 NOTE — Telephone Encounter (Signed)
[facility] needed an order so they could schedule the patient for a mammogram.  Order has been placed.

## 2017-03-26 ENCOUNTER — Ambulatory Visit: Payer: Self-pay | Admitting: Cardiovascular Disease

## 2017-03-30 ENCOUNTER — Other Ambulatory Visit: Payer: Self-pay | Admitting: Internal Medicine

## 2017-03-30 DIAGNOSIS — I422 Other hypertrophic cardiomyopathy: Secondary | ICD-10-CM

## 2017-03-30 DIAGNOSIS — I5032 Chronic diastolic (congestive) heart failure: Secondary | ICD-10-CM

## 2017-03-30 DIAGNOSIS — I110 Hypertensive heart disease with heart failure: Secondary | ICD-10-CM

## 2017-03-31 ENCOUNTER — Other Ambulatory Visit: Payer: Self-pay | Admitting: Internal Medicine

## 2017-03-31 ENCOUNTER — Ambulatory Visit (INDEPENDENT_AMBULATORY_CARE_PROVIDER_SITE_OTHER): Payer: Medicare Other | Admitting: *Deleted

## 2017-03-31 ENCOUNTER — Other Ambulatory Visit: Payer: Self-pay | Admitting: General Practice

## 2017-03-31 DIAGNOSIS — I442 Atrioventricular block, complete: Secondary | ICD-10-CM | POA: Diagnosis not present

## 2017-03-31 DIAGNOSIS — I110 Hypertensive heart disease with heart failure: Secondary | ICD-10-CM

## 2017-03-31 DIAGNOSIS — I5032 Chronic diastolic (congestive) heart failure: Secondary | ICD-10-CM

## 2017-03-31 DIAGNOSIS — I422 Other hypertrophic cardiomyopathy: Secondary | ICD-10-CM

## 2017-03-31 MED ORDER — FUROSEMIDE 40 MG PO TABS: 40.0000 mg | ORAL_TABLET | Freq: Every day | ORAL | 1 refills | Status: DC

## 2017-03-31 MED ORDER — WARFARIN SODIUM 2.5 MG PO TABS: ORAL_TABLET | ORAL | 3 refills | Status: DC

## 2017-03-31 NOTE — Progress Notes (Signed)
Remote pacemaker transmission.   

## 2017-04-01 ENCOUNTER — Telehealth: Payer: Self-pay | Admitting: *Deleted

## 2017-04-01 ENCOUNTER — Telehealth: Payer: Self-pay | Admitting: Internal Medicine

## 2017-04-01 DIAGNOSIS — N6320 Unspecified lump in the left breast, unspecified quadrant: Secondary | ICD-10-CM

## 2017-04-01 LAB — CUP PACEART REMOTE DEVICE CHECK
Battery Impedance: 307 Ohm
Battery Remaining Longevity: 103 mo
Battery Voltage: 2.79 V
Brady Statistic AP VP Percent: 11 %
Brady Statistic AS VP Percent: 89 %
Date Time Interrogation Session: 20180828144450
Implantable Lead Implant Date: 20131210
Implantable Lead Location: 753859
Implantable Pulse Generator Implant Date: 20131210
Lead Channel Impedance Value: 460 Ohm
Lead Channel Pacing Threshold Amplitude: 0.625 V
Lead Channel Pacing Threshold Amplitude: 0.875 V
Lead Channel Setting Pacing Amplitude: 2.5 V
Lead Channel Setting Pacing Pulse Width: 0.4 ms
MDC IDC LEAD IMPLANT DT: 20131210
MDC IDC LEAD LOCATION: 753858
MDC IDC MSMT LEADCHNL RA PACING THRESHOLD PULSEWIDTH: 0.4 ms
MDC IDC MSMT LEADCHNL RV IMPEDANCE VALUE: 849 Ohm
MDC IDC MSMT LEADCHNL RV PACING THRESHOLD PULSEWIDTH: 0.4 ms
MDC IDC SET LEADCHNL RA PACING AMPLITUDE: 2 V
MDC IDC SET LEADCHNL RV SENSING SENSITIVITY: 8 mV
MDC IDC STAT BRADY AP VS PERCENT: 0 %
MDC IDC STAT BRADY AS VS PERCENT: 0 %

## 2017-04-01 MED ORDER — AMOXICILLIN 500 MG PO CAPS: ORAL_CAPSULE | ORAL | 1 refills | Status: DC

## 2017-04-01 NOTE — Telephone Encounter (Signed)
Ok to refill if for SBE she has VSD patch and prosthetic TV

## 2017-04-01 NOTE — Telephone Encounter (Signed)
Will forward to Dr.Nishan. 

## 2017-04-01 NOTE — Telephone Encounter (Signed)
States that patient called stating she had breast issues that she had discussed with Dr. Camila Li on last OV.  States the only order in the system is for a mammogram screening.  Patient has informed that she needs a diagnostic mammogram and ultrasound.  Please update orders if needed and call back.

## 2017-04-01 NOTE — Telephone Encounter (Signed)
Orders updated

## 2017-04-01 NOTE — Telephone Encounter (Signed)
Sent in prescription to patients pharmacy.  

## 2017-04-01 NOTE — Telephone Encounter (Signed)
Received a refill request from [facility] for amoxicillin 500 mg capsule. Okay to refill? Please advise. Thanks, MI

## 2017-04-08 ENCOUNTER — Telehealth: Payer: Self-pay | Admitting: Pulmonary Disease

## 2017-04-08 NOTE — Telephone Encounter (Signed)
Merry Proud returned call, [phone] ext [phone].

## 2017-04-08 NOTE — Telephone Encounter (Signed)
Spoke with Merry Proud from [facility]. His clinical questions have been answered. Nothing further was needed at this time.

## 2017-04-08 NOTE — Telephone Encounter (Signed)
lmtcb x1 for [place].

## 2017-04-10 ENCOUNTER — Encounter: Payer: Self-pay | Admitting: Cardiology

## 2017-04-15 ENCOUNTER — Other Ambulatory Visit: Payer: Self-pay | Admitting: Internal Medicine

## 2017-04-15 ENCOUNTER — Ambulatory Visit
Admission: RE | Admit: 2017-04-15 | Discharge: 2017-04-15 | Disposition: A | Discharge status: Home / Self Care | Payer: Self-pay | Source: Ambulatory Visit | Attending: Internal Medicine | Admitting: Internal Medicine

## 2017-04-15 ENCOUNTER — Ambulatory Visit: Payer: Self-pay

## 2017-04-15 ENCOUNTER — Ambulatory Visit
Admission: RE | Admit: 2017-04-15 | Discharge: 2017-04-15 | Disposition: A | Discharge status: Home / Self Care | Payer: Medicare Other | Source: Ambulatory Visit | Attending: Internal Medicine | Admitting: Internal Medicine

## 2017-04-15 DIAGNOSIS — Z139 Encounter for screening, unspecified: Secondary | ICD-10-CM

## 2017-04-15 DIAGNOSIS — N6320 Unspecified lump in the left breast, unspecified quadrant: Secondary | ICD-10-CM

## 2017-04-16 ENCOUNTER — Telehealth: Payer: Self-pay | Admitting: Pulmonary Disease

## 2017-04-16 LAB — HM MAMMOGRAPHY

## 2017-04-16 NOTE — Telephone Encounter (Signed)
Received fax from [employer] stating:  "Dear Dr. [name], [facility] has received an appeal from the above number for the denial of Modafinil.  We will need the follow up addition information to complete a review for the appeal of Modafinil.  Please respond to the follow questions and submit any chart documentation that may support its use.   The medication Modafinil Tab 100mg  requires prior authorization. The patient has a diagnosis of obstructive sleep apnea/hypopnea syndrome.  Is there documentation of positive clinical response to prior therapy?"   This was responded by [facility] on 9.12.18: She was using Nuvigil and had good repsonse.  Not covered by you, so need to change to Provigil.  This has ben faxed back to [facility] and sent for scan. Will sign off

## 2017-05-03 ENCOUNTER — Encounter (HOSPITAL_COMMUNITY): Payer: Self-pay | Admitting: Emergency Medicine

## 2017-05-03 ENCOUNTER — Inpatient Hospital Stay (HOSPITAL_COMMUNITY)
Admission: EM | Admit: 2017-05-03 | Discharge: 2017-05-08 | DRG: 871 | Disposition: A | Discharge status: Home Health | Payer: Medicare Other | Attending: Family Medicine | Admitting: Family Medicine

## 2017-05-03 ENCOUNTER — Emergency Department (HOSPITAL_COMMUNITY): Payer: Medicare Other

## 2017-05-03 DIAGNOSIS — Z86010 Personal history of colonic polyps: Secondary | ICD-10-CM

## 2017-05-03 DIAGNOSIS — D696 Thrombocytopenia, unspecified: Secondary | ICD-10-CM | POA: Diagnosis present

## 2017-05-03 DIAGNOSIS — Z8052 Family history of malignant neoplasm of bladder: Secondary | ICD-10-CM

## 2017-05-03 DIAGNOSIS — R6520 Severe sepsis without septic shock: Secondary | ICD-10-CM | POA: Diagnosis present

## 2017-05-03 DIAGNOSIS — Z79899 Other long term (current) drug therapy: Secondary | ICD-10-CM

## 2017-05-03 DIAGNOSIS — G9341 Metabolic encephalopathy: Secondary | ICD-10-CM | POA: Diagnosis present

## 2017-05-03 DIAGNOSIS — I130 Hypertensive heart and chronic kidney disease with heart failure and stage 1 through stage 4 chronic kidney disease, or unspecified chronic kidney disease: Secondary | ICD-10-CM | POA: Diagnosis present

## 2017-05-03 DIAGNOSIS — I422 Other hypertrophic cardiomyopathy: Secondary | ICD-10-CM

## 2017-05-03 DIAGNOSIS — I110 Hypertensive heart disease with heart failure: Secondary | ICD-10-CM

## 2017-05-03 DIAGNOSIS — J181 Lobar pneumonia, unspecified organism: Secondary | ICD-10-CM | POA: Diagnosis present

## 2017-05-03 DIAGNOSIS — F329 Major depressive disorder, single episode, unspecified: Secondary | ICD-10-CM | POA: Diagnosis present

## 2017-05-03 DIAGNOSIS — Z23 Encounter for immunization: Secondary | ICD-10-CM

## 2017-05-03 DIAGNOSIS — E1122 Type 2 diabetes mellitus with diabetic chronic kidney disease: Secondary | ICD-10-CM | POA: Diagnosis present

## 2017-05-03 DIAGNOSIS — M109 Gout, unspecified: Secondary | ICD-10-CM | POA: Diagnosis present

## 2017-05-03 DIAGNOSIS — E876 Hypokalemia: Secondary | ICD-10-CM | POA: Diagnosis present

## 2017-05-03 DIAGNOSIS — Z803 Family history of malignant neoplasm of breast: Secondary | ICD-10-CM

## 2017-05-03 DIAGNOSIS — Z952 Presence of prosthetic heart valve: Secondary | ICD-10-CM

## 2017-05-03 DIAGNOSIS — I4891 Unspecified atrial fibrillation: Secondary | ICD-10-CM | POA: Diagnosis present

## 2017-05-03 DIAGNOSIS — A419 Sepsis, unspecified organism: Secondary | ICD-10-CM | POA: Diagnosis present

## 2017-05-03 DIAGNOSIS — Z6832 Body mass index (BMI) 32.0-32.9, adult: Secondary | ICD-10-CM

## 2017-05-03 DIAGNOSIS — E871 Hypo-osmolality and hyponatremia: Secondary | ICD-10-CM | POA: Diagnosis present

## 2017-05-03 DIAGNOSIS — I421 Obstructive hypertrophic cardiomyopathy: Secondary | ICD-10-CM | POA: Diagnosis present

## 2017-05-03 DIAGNOSIS — N183 Chronic kidney disease, stage 3 (moderate): Secondary | ICD-10-CM | POA: Diagnosis not present

## 2017-05-03 DIAGNOSIS — Z7901 Long term (current) use of anticoagulants: Secondary | ICD-10-CM

## 2017-05-03 DIAGNOSIS — E118 Type 2 diabetes mellitus with unspecified complications: Secondary | ICD-10-CM | POA: Diagnosis present

## 2017-05-03 DIAGNOSIS — Z7951 Long term (current) use of inhaled steroids: Secondary | ICD-10-CM

## 2017-05-03 DIAGNOSIS — I252 Old myocardial infarction: Secondary | ICD-10-CM

## 2017-05-03 DIAGNOSIS — R0603 Acute respiratory distress: Secondary | ICD-10-CM | POA: Diagnosis present

## 2017-05-03 DIAGNOSIS — Z8249 Family history of ischemic heart disease and other diseases of the circulatory system: Secondary | ICD-10-CM

## 2017-05-03 DIAGNOSIS — I480 Paroxysmal atrial fibrillation: Secondary | ICD-10-CM | POA: Diagnosis not present

## 2017-05-03 DIAGNOSIS — I5032 Chronic diastolic (congestive) heart failure: Secondary | ICD-10-CM | POA: Diagnosis present

## 2017-05-03 DIAGNOSIS — Z950 Presence of cardiac pacemaker: Secondary | ICD-10-CM | POA: Diagnosis present

## 2017-05-03 DIAGNOSIS — D6959 Other secondary thrombocytopenia: Secondary | ICD-10-CM | POA: Diagnosis present

## 2017-05-03 DIAGNOSIS — K219 Gastro-esophageal reflux disease without esophagitis: Secondary | ICD-10-CM | POA: Diagnosis present

## 2017-05-03 DIAGNOSIS — E669 Obesity, unspecified: Secondary | ICD-10-CM | POA: Diagnosis present

## 2017-05-03 DIAGNOSIS — J189 Pneumonia, unspecified organism: Secondary | ICD-10-CM | POA: Diagnosis not present

## 2017-05-03 DIAGNOSIS — R0902 Hypoxemia: Secondary | ICD-10-CM | POA: Diagnosis present

## 2017-05-03 DIAGNOSIS — E872 Acidosis: Secondary | ICD-10-CM | POA: Diagnosis present

## 2017-05-03 DIAGNOSIS — Z8042 Family history of malignant neoplasm of prostate: Secondary | ICD-10-CM

## 2017-05-03 DIAGNOSIS — N179 Acute kidney failure, unspecified: Secondary | ICD-10-CM | POA: Diagnosis present

## 2017-05-03 DIAGNOSIS — I4892 Unspecified atrial flutter: Secondary | ICD-10-CM | POA: Diagnosis present

## 2017-05-03 DIAGNOSIS — J440 Chronic obstructive pulmonary disease with acute lower respiratory infection: Secondary | ICD-10-CM | POA: Diagnosis present

## 2017-05-03 LAB — COMPREHENSIVE METABOLIC PANEL
ALBUMIN: 3.4 g/dL — AB (ref 3.5–5.0)
ALT: 14 U/L (ref 14–54)
AST: 31 U/L (ref 15–41)
Alkaline Phosphatase: 126 U/L (ref 38–126)
Anion gap: 10 (ref 5–15)
BUN: 39 mg/dL — AB (ref 6–20)
CHLORIDE: 97 mmol/L — AB (ref 101–111)
CO2: 23 mmol/L (ref 22–32)
CREATININE: 1.82 mg/dL — AB (ref 0.44–1.00)
Calcium: 8.5 mg/dL — ABNORMAL LOW (ref 8.9–10.3)
GFR calc Af Amer: 31 mL/min — ABNORMAL LOW (ref >60)
GFR, EST NON AFRICAN AMERICAN: 26 mL/min — AB (ref >60)
GLUCOSE: 123 mg/dL — AB (ref 65–99)
POTASSIUM: 4.1 mmol/L (ref 3.5–5.1)
SODIUM: 130 mmol/L — AB (ref 135–145)
Total Bilirubin: 0.7 mg/dL (ref 0.3–1.2)
Total Protein: 7.4 g/dL (ref 6.5–8.1)

## 2017-05-03 LAB — CBC WITH DIFFERENTIAL/PLATELET
Basophils Absolute: 0 10*3/uL (ref 0.0–0.1)
Basophils Relative: 0 %
EOS ABS: 0 10*3/uL (ref 0.0–0.7)
EOS PCT: 0 %
HCT: 34.7 % — ABNORMAL LOW (ref 36.0–46.0)
Hemoglobin: 11.2 g/dL — ABNORMAL LOW (ref 12.0–15.0)
LYMPHS ABS: 0.3 10*3/uL — AB (ref 0.7–4.0)
LYMPHS PCT: 3 %
MCH: 29.2 pg (ref 26.0–34.0)
MCHC: 32.3 g/dL (ref 30.0–36.0)
MCV: 90.6 fL (ref 78.0–100.0)
MONO ABS: 0.7 10*3/uL (ref 0.1–1.0)
Monocytes Relative: 8 %
Neutro Abs: 7.7 10*3/uL (ref 1.7–7.7)
Neutrophils Relative %: 89 %
PLATELETS: 108 10*3/uL — AB (ref 150–400)
RBC: 3.83 MIL/uL — AB (ref 3.87–5.11)
RDW: 15.2 % (ref 11.5–15.5)
WBC: 8.7 10*3/uL (ref 4.0–10.5)

## 2017-05-03 LAB — URINALYSIS, ROUTINE W REFLEX MICROSCOPIC
BACTERIA UA: NONE SEEN
BILIRUBIN URINE: NEGATIVE
Glucose, UA: NEGATIVE mg/dL
Ketones, ur: NEGATIVE mg/dL
LEUKOCYTES UA: NEGATIVE
Nitrite: NEGATIVE
Specific Gravity, Urine: 1.02 (ref 1.005–1.030)
pH: 5 (ref 5.0–8.0)

## 2017-05-03 LAB — CBG MONITORING, ED: GLUCOSE-CAPILLARY: 85 mg/dL (ref 65–99)

## 2017-05-03 LAB — I-STAT CG4 LACTIC ACID, ED: LACTIC ACID, VENOUS: 1.44 mmol/L (ref 0.5–1.9)

## 2017-05-03 LAB — BRAIN NATRIURETIC PEPTIDE: B NATRIURETIC PEPTIDE 5: 405.8 pg/mL — AB (ref 0.0–100.0)

## 2017-05-03 MED ORDER — BUPROPION HCL ER (XL) 150 MG PO TB24
150.0000 mg | ORAL_TABLET | Freq: Every day | ORAL | Status: DC
  Administered 2017-05-03 – 2017-05-08 (×6): 150 mg via ORAL
  Filled 2017-05-03 (×6): qty 1

## 2017-05-03 MED ORDER — IPRATROPIUM-ALBUTEROL 0.5-2.5 (3) MG/3ML IN SOLN: 3.0000 mL | RESPIRATORY_TRACT | Status: DC | PRN

## 2017-05-03 MED ORDER — ACETAMINOPHEN 325 MG PO TABS
ORAL_TABLET | ORAL | Status: AC
  Filled 2017-05-03: qty 2

## 2017-05-03 MED ORDER — METOPROLOL TARTRATE 25 MG PO TABS
25.0000 mg | ORAL_TABLET | Freq: Two times a day (BID) | ORAL | Status: DC
  Administered 2017-05-03 – 2017-05-08 (×10): 25 mg via ORAL
  Filled 2017-05-03 (×10): qty 1

## 2017-05-03 MED ORDER — IPRATROPIUM-ALBUTEROL 0.5-2.5 (3) MG/3ML IN SOLN
3.0000 mL | Freq: Four times a day (QID) | RESPIRATORY_TRACT | Status: DC
  Administered 2017-05-03 – 2017-05-04 (×2): 3 mL via RESPIRATORY_TRACT
  Filled 2017-05-03 (×2): qty 3

## 2017-05-03 MED ORDER — INSULIN ASPART 100 UNIT/ML ~~LOC~~ SOLN
0.0000 [IU] | Freq: Three times a day (TID) | SUBCUTANEOUS | Status: DC
  Administered 2017-05-04: 1 [IU] via SUBCUTANEOUS
  Administered 2017-05-05 (×2): 2 [IU] via SUBCUTANEOUS
  Administered 2017-05-05: 3 [IU] via SUBCUTANEOUS
  Administered 2017-05-06: 2 [IU] via SUBCUTANEOUS
  Administered 2017-05-06: 5 [IU] via SUBCUTANEOUS
  Administered 2017-05-06 – 2017-05-07 (×2): 2 [IU] via SUBCUTANEOUS
  Administered 2017-05-07: 3 [IU] via SUBCUTANEOUS
  Administered 2017-05-07 – 2017-05-08 (×2): 2 [IU] via SUBCUTANEOUS
  Administered 2017-05-08: 1 [IU] via SUBCUTANEOUS

## 2017-05-03 MED ORDER — SODIUM CHLORIDE 0.9 % IV BOLUS (SEPSIS)
500.0000 mL | Freq: Once | INTRAVENOUS | Status: AC
  Administered 2017-05-03: 500 mL via INTRAVENOUS

## 2017-05-03 MED ORDER — ONDANSETRON HCL 4 MG/2ML IJ SOLN: 4.0000 mg | Freq: Four times a day (QID) | INTRAMUSCULAR | Status: DC | PRN

## 2017-05-03 MED ORDER — DEXTROSE 5 % IV SOLN
500.0000 mg | INTRAVENOUS | Status: DC
  Administered 2017-05-04 – 2017-05-05 (×2): 500 mg via INTRAVENOUS
  Filled 2017-05-03 (×3): qty 500

## 2017-05-03 MED ORDER — ENOXAPARIN SODIUM 40 MG/0.4ML ~~LOC~~ SOLN
40.0000 mg | SUBCUTANEOUS | Status: DC
  Administered 2017-05-03 – 2017-05-04 (×2): 40 mg via SUBCUTANEOUS
  Filled 2017-05-03 (×2): qty 0.4

## 2017-05-03 MED ORDER — ONDANSETRON HCL 4 MG PO TABS: 4.0000 mg | ORAL_TABLET | Freq: Four times a day (QID) | ORAL | Status: DC | PRN

## 2017-05-03 MED ORDER — ACETAMINOPHEN 650 MG RE SUPP: 650.0000 mg | Freq: Four times a day (QID) | RECTAL | Status: DC | PRN

## 2017-05-03 MED ORDER — SODIUM CHLORIDE 0.9 % IV SOLN
INTRAVENOUS | Status: DC
  Administered 2017-05-03 – 2017-05-04 (×2): via INTRAVENOUS

## 2017-05-03 MED ORDER — ACETAMINOPHEN 325 MG PO TABS
650.0000 mg | ORAL_TABLET | Freq: Once | ORAL | Status: AC | PRN
  Administered 2017-05-03: 650 mg via ORAL

## 2017-05-03 MED ORDER — DEXTROSE 5 % IV SOLN
1.0000 g | INTRAVENOUS | Status: DC
  Administered 2017-05-03 – 2017-05-07 (×5): 1 g via INTRAVENOUS
  Filled 2017-05-03 (×6): qty 10

## 2017-05-03 MED ORDER — ADULT MULTIVITAMIN W/MINERALS CH
1.0000 | ORAL_TABLET | Freq: Every day | ORAL | Status: DC
  Administered 2017-05-04 – 2017-05-08 (×5): 1 via ORAL
  Filled 2017-05-03 (×5): qty 1

## 2017-05-03 MED ORDER — FAMOTIDINE 20 MG PO TABS
20.0000 mg | ORAL_TABLET | Freq: Every day | ORAL | Status: DC
  Administered 2017-05-03 – 2017-05-08 (×6): 20 mg via ORAL
  Filled 2017-05-03 (×6): qty 1

## 2017-05-03 MED ORDER — CEFTRIAXONE SODIUM 1 G IJ SOLR
1.0000 g | Freq: Once | INTRAMUSCULAR | Status: AC
  Administered 2017-05-03: 1 g via INTRAVENOUS
  Filled 2017-05-03: qty 10

## 2017-05-03 MED ORDER — ALLOPURINOL 100 MG PO TABS
100.0000 mg | ORAL_TABLET | Freq: Every day | ORAL | Status: DC
  Administered 2017-05-04 – 2017-05-08 (×5): 100 mg via ORAL
  Filled 2017-05-03 (×6): qty 1

## 2017-05-03 MED ORDER — ACETAMINOPHEN 325 MG PO TABS
650.0000 mg | ORAL_TABLET | Freq: Four times a day (QID) | ORAL | Status: DC | PRN
  Administered 2017-05-04: 650 mg via ORAL
  Filled 2017-05-03: qty 2

## 2017-05-03 MED ORDER — LAMOTRIGINE 25 MG PO TABS
25.0000 mg | ORAL_TABLET | Freq: Every day | ORAL | Status: DC
  Administered 2017-05-03 – 2017-05-08 (×6): 25 mg via ORAL
  Filled 2017-05-03 (×6): qty 1

## 2017-05-03 NOTE — ED Triage Notes (Signed)
Pts son reports pt was seen at walk in clinic on Friday, diagnosed with pneumonia (xray report and papers sent with pt from doctors office) pt was seen back at clinic today for follow up and was not any better despite abx therapy. pts son reports pt has had fevers as high ast 103 at home and has had episodes of being somewhat altered. Pt a/ox4 in triage, resp e/u, nad.

## 2017-05-03 NOTE — ED Provider Notes (Signed)
[facility] DEPT Provider Note   CSN: [id number] Arrival date & time: 05/03/17  1418     History   Chief Complaint Chief Complaint  Patient presents with  . Pneumonia  . Altered Mental Status    HPI Holly Simmons is a 76 y.o. female with past medical history COPD, diabetes who presents with persistent cough, fever and difficulty breathing. Patient reports that she has been having symptoms for the last couple days. Patient was seen by her primary care doctor 2 days ago where she had a chest x-ray done. At that time, there is concern for bronchitis. She was started on azithromycin and given an Atrovent inhaler. Patient reports that since then, her fever has persisted despite antipyretics. Patient also reports that her cough has continued. Patient reports that she felt some worsening shortness of breath today. Patient was seen by her primary care doctor today for follow-up. Repeat chest x-ray showed a left lower lobe pneumonia. Additionally, patient was slightly hypoxic with low O2 sats, prompting ED visit. Family reports that patient has been slightly altered today. They reports that while she was at the doctor's office, she was pressing the table to try and "pressed the button to open the door." Patient does report that she feels like she has been having some bilateral lower extremity edema over the last few days. Patient reports she had a few episodes of non-bloody diarrhea. She is on Lasix every other day which she states that she has been taking. Patient denies any chest pain, dysuria, hematuria, vomiting, abdominal pain.  The history is provided by the patient.    Past Medical History:  Diagnosis Date  . Allergic rhinitis   . Asthma    NOS w/ acute exacerbation  . Blood transfusion without reported diagnosis   . Colon polyps    TUBULAR ADENOMAS AND HYPERPLASTIC  . Complete heart block (HCC)    requiring PPM (MDT) post surgical myomectomy at [facility],  leads are epicardial with  abdominal implant, high ventricular threshold at implant  . COPD (chronic obstructive pulmonary disease) ([place])   . Depressive disorder   . Diastolic dysfunction   . DM (diabetes mellitus) ([place])   . Gallstones   . GERD (gastroesophageal reflux disease)   . Gout 08/20/2012  . Heart murmur   . Hyperpotassemia   . Hypersomnia   . Hypertension   . Hypertrophic cardiomyopathy ([place])    s/p surgical myomectomy at [facility] 08/65 complicated by septal VSD post procedure requiring reoperation with patch closure and tricuspid valve replacement  . Kidney stones   . Myocardial infarction ([place]) 2011  . Obstructive sleep apnea    persistent daytime sleepiness despite cpap  . 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 Psoriasis   . Pyuria   . Renal insufficiency   . 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 Typical atrial flutter ([place]) 9/15    Patient Active Problem List   Diagnosis Date Noted  . 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 Obesity (BMI 30.0-34.9) 02/05/2016  . Osteoporosis with pathological fracture of ankle and foot 05/30/2015  . Encounter for therapeutic drug monitoring 11/22/2014  . 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 Atrial flutter ([place]) 05/08/2014  . Complete heart block ([place]) 07/29/2013  . Colon polyps 09/08/2012  . Gout 08/20/2012  . Eczema 07/07/2012  . 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 Elevated total protein 05/14/2012  . Other screening mammogram 02/04/2012  . Hyperlipidemia with target LDL less than 100 12/04/2011  .  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 Hypertrophic cardiomyopathy ([place]) 09/25/2011  . Type II diabetes mellitus with manifestations ([place]) 08/25/2011  . 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 Long term (current) use of anticoagulants 07/18/2011  . Atrial fibrillation ([place]) 07/18/2011  . Routine general medical examination at a health care facility 05/15/2011  . Tinea of the body 09/12/2010    . Hypertrophic obstructive cardiomyopathy ([place]) 05/22/2010  . Hypersomnia 08/16/2009  . Depression with anxiety 11/28/2008  . Chronic diastolic heart failure ([place]) 08/25/2008  . COPD bronchitis 08/10/2008  . GERD 08/10/2008  . Disorder resulting from impaired renal function 08/10/2008  . Obstructive sleep apnea 08/10/2007  . Allergic rhinitis 08/09/2007    Past Surgical History:  Procedure Laterality Date  . ABDOMINAL HYSTERECTOMY    . APPENDECTOMY    . BREAST CYST EXCISION    . CARDIOVERSION N/A 08/01/2014   Procedure: CARDIOVERSION;  Surgeon: Thayer Headings, MD;  Location: [facility];  Service: Cardiovascular;  Laterality: N/A;  . CESAREAN SECTION    . CHOLECYSTECTOMY    . COLONOSCOPY    . EYE SURGERY  [id number]   left eye lens implant  . EYE SURGERY  [id number]   right eye lens implant  . INSERT / REPLACE / REMOVE PACEMAKER  07/13/2012  . PACEMAKER INSERTION  07/02/11   epicardial wires with abdominal implant at [facility] 12/12,  high ventricular lead threshold at implant per Dr Westley Gambles  . PERMANENT PACEMAKER INSERTION N/A 07/13/2012   Procedure: PERMANENT PACEMAKER INSERTION;  Surgeon: Thompson Grayer, MD;  Location: [facility] CATH LAB;  Service: Cardiovascular;  Laterality: N/A;  . POLYPECTOMY    . septal myomectomy for hypertrophic CM  06/23/11   by Dr Evelina Dun at [facility], complicated by septal VSD requiring patch repair and tricuspid valve replacement  . TONSILLECTOMY    . TRICUSPID VALVE REPLACEMENT  11/12   Medtronic 43mm Mosaic tissue valve  . VSD REPAIR  06/2011    OB History    No data available       Home Medications    Prior to Admission medications   Medication Sig Start Date End Date Taking? Authorizing Provider  allopurinol (ZYLOPRIM) 100 MG tablet Take 1 tablet (100 mg total) by mouth daily. 10/28/16  Yes Janith Lima, MD  B Complex-C (B-COMPLEX WITH VITAMIN C) tablet Take 1 tablet by mouth daily.   Yes [provider]  Betamethasone Valerate 0.12 % foam  APPLY TO SCALP AS DIRECTED 07/02/16  Yes Janith Lima, MD  buPROPion (WELLBUTRIN XL) 150 MG 24 hr tablet TAKE 1 TABLET ONCE DAILY. Patient taking differently: TAKE 150 mg TABLET ONCE DAILY. 03/30/17  Yes Janith Lima, MD  Cholecalciferol 2000 UNITS TABS Take 1 tablet (2,000 Units total) by mouth daily. 05/31/15  Yes Janith Lima, MD  ciclopirox (LOPROX) 0.77 % SUSP Apply 1 Act topically 2 (two) times daily. Patient taking differently: Apply 1 Act topically as needed.  10/30/16  Yes Janith Lima, MD  fexofenadine (ALLEGRA) 180 MG tablet Take 180 mg by mouth daily as needed for allergies.    Yes [provider]  furosemide (LASIX) 40 MG tablet Take 1 tablet (40 mg total) by mouth daily. Patient taking differently: Take 40 mg by mouth every other day.  03/31/17  Yes Janith Lima, MD  ipratropium (ATROVENT) 0.03 % nasal spray Place 2 sprays into both nostrils 3 (three) times daily as needed for rhinitis. 12/17/16  Yes [name], MD  lamoTRIgine (LAMICTAL) 25 MG tablet TAKE 1 TABLET ONCE DAILY. Patient taking differently: TAKE 25 mg in the evening 02/26/17  Yes Janith Lima, MD  linagliptin (TRADJENTA) 5 MG TABS tablet Take 1 tablet (5 mg total) by mouth daily. 10/28/16  Yes Janith Lima, MD  metoprolol succinate (TOPROL-XL) 100 MG 24 hr tablet Take 1 tablet (100 mg total) by mouth daily. Take with or immediately following a meal. 10/28/16  Yes Janith Lima, MD  modafinil (PROVIGIL) 100 MG tablet Take 1 tablet (100 mg total) by mouth daily. Patient taking differently: Take 100 mg by mouth as needed.  01/16/17  Yes [name], MD  Multiple Vitamin (MULTIVITAMIN) capsule Take 1 capsule by mouth daily.   Yes [provider]  PRESCRIPTION MEDICATION CPAP   Yes [provider]  ranitidine (ZANTAC) 300 MG tablet TAKE 1 TABLET (300 MG TOTAL) BY MOUTH AT BEDTIME. 09/30/16  Yes Janith Lima, MD  spironolactone (ALDACTONE) 25 MG tablet TAKE (1) TABLET EVERY OTHER  DAY. 03/30/17  Yes Janith Lima, MD  SYMBICORT 80-4.5 MCG/ACT inhaler USE 2 PUFFS TWICE A DAY. 03/30/17  Yes Janith Lima, MD  triamcinolone cream (KENALOG) 0.5 % Apply 1 application topically 3 (three) times daily as needed (itching).  09/28/13  Yes Janith Lima, MD  VIIBRYD 40 MG TABS TAKE 1 TABLET (40 MG TOTAL) BY MOUTH DAILY. 09/30/16  Yes Janith Lima, MD  warfarin (COUMADIN) 2.5 MG tablet Take as directed by coumadin clinic Patient taking differently: Take 2.5 mg by mouth at bedtime. Take as directed by coumadin clinic 03/31/17  Yes Janith Lima, MD  amoxicillin (AMOXIL) 500 MG capsule Take 4 capsule by mouth one hour prior to dental appointment Patient not taking: Reported on 05/03/2017 04/01/17   Josue Hector, MD    Family History Family History  Problem Relation Age of Onset  . Hypertension Daughter   . Heart disease Maternal Grandfather   . Heart disease Paternal Grandfather   . Bladder Cancer Father   . Prostate cancer Father   . Cancer Father   . Varicose Veins Father   . Heart attack Father   . Breast cancer Mother   . Dementia Mother   . Hypertension Mother   . Cancer Mother   . Ovarian cancer Maternal Aunt   . Pancreatic cancer Cousin   . Breast cancer Paternal Aunt   . Dementia Maternal Aunt        x 2  . Dementia Maternal Uncle        x 2  . Colon cancer Neg Hx     Social History Social History  Substance Use Topics  . Smoking status: Never Smoker  . Smokeless tobacco: Never Used  . Alcohol use No     Allergies   Sulfonamide derivatives; Tetanus toxoids; Other; and Nsaids   Review of Systems Review of Systems  Constitutional: Positive for fever. Negative for chills.  HENT: Negative for congestion.   Respiratory: Positive for cough and shortness of breath.   Cardiovascular: Positive for leg swelling. Negative for chest pain.  Gastrointestinal: Positive for diarrhea. Negative for abdominal pain, nausea and vomiting.  Genitourinary:  Negative for dysuria and hematuria.  Neurological: Negative for dizziness, weakness and numbness.  Psychiatric/Behavioral: Positive for confusion.     Physical Exam Updated Vital Signs BP (!) 117/56   Pulse 60   Temp (!) 101.2 F (38.4 C) (Oral)   Resp (!) 23   Ht 5'  5" (1.651 m)   Wt 81.6 kg (180 lb)   SpO2 90%   BMI 29.95 kg/m   Physical Exam  Constitutional: She is oriented to person, place, and time. She appears well-developed and well-nourished.  Elderly appearing  HENT:  Head: Normocephalic and atraumatic.  Mouth/Throat: Oropharynx is clear and moist and mucous membranes are normal.  Eyes: Pupils are equal, round, and reactive to light. Conjunctivae, EOM and lids are normal.  Neck: Full passive range of motion without pain.  Cardiovascular: Normal rate, regular rhythm, normal heart sounds and normal pulses.  Exam reveals no gallop and no friction rub.   No murmur heard. Pulmonary/Chest: Effort normal. No accessory muscle usage. No respiratory distress. She has rales.  No evidence of respiratory distress. Able to speak in full sentences without difficulty. Diffuse rales throughout lung fields.   Abdominal: Soft. Normal appearance. There is no tenderness. There is no rigidity and no guarding.  Musculoskeletal: Normal range of motion.  Bilateral lower extremities are symmetric in appearance.  Neurological: She is alert and oriented to person, place, and time. GCS eye subscore is 4. GCS verbal subscore is 5. GCS motor subscore is 6.  A&Ox 4   Skin: Skin is warm and dry. Capillary refill takes less than 2 seconds.  Psychiatric: She has a normal mood and affect. Her speech is normal.  Nursing note and vitals reviewed.    ED Treatments / Results  Labs (all labs ordered are listed, but only abnormal results are displayed) Labs Reviewed  COMPREHENSIVE METABOLIC PANEL - Abnormal; Notable for the following:       Result Value   Sodium 130 (*)    Chloride 97 (*)     Glucose, Bld 123 (*)    BUN 39 (*)    Creatinine, Ser 1.82 (*)    Calcium 8.5 (*)    Albumin 3.4 (*)    GFR calc non Af Amer 26 (*)    GFR calc Af Amer 31 (*)    All other components within normal limits  CBC WITH DIFFERENTIAL/PLATELET - Abnormal; Notable for the following:    RBC 3.83 (*)    Hemoglobin 11.2 (*)    HCT 34.7 (*)    Platelets 108 (*)    Lymphs Abs 0.3 (*)    All other components within normal limits  BRAIN NATRIURETIC PEPTIDE - Abnormal; Notable for the following:    B Natriuretic Peptide 405.8 (*)    All other components within normal limits  URINALYSIS, ROUTINE W REFLEX MICROSCOPIC - Abnormal; Notable for the following:    Color, Urine AMBER (*)    APPearance CLOUDY (*)    Hgb urine dipstick SMALL (*)    Protein, ur >=300 (*)    Squamous Epithelial / LPF 0-5 (*)    All other components within normal limits  CULTURE, BLOOD (ROUTINE X 2)  CULTURE, BLOOD (ROUTINE X 2)  I-STAT CG4 LACTIC ACID, ED  I-STAT CG4 LACTIC ACID, ED    EKG  EKG Interpretation None       Radiology Dg Chest Portable 1 View  Result Date: 05/03/2017 CLINICAL DATA:  Cough for the past week. Fever. Myalgias. Some shortness of breath. EXAM: PORTABLE CHEST 1 VIEW COMPARISON:  07/14/2012. FINDINGS: Mildly progressive enlargement of the cardiac silhouette. Stable post CABG changes, left subclavian pacemaker lead and epicardial leads. Interval patchy opacity in the lateral aspect of the left mid and lower lung zone. Clear right lung. Unremarkable bones. IMPRESSION: 1. Probable pneumonia in the  left mid and lower lung zones. Followup PA and lateral chest X-ray is recommended in 3-4 weeks following trial of antibiotic therapy to ensure resolution and exclude underlying malignancy. 2. Mildly progressive cardiomegaly. Electronically Signed   By: Claudie Revering M.D.   On: 05/03/2017 16:40    Procedures Procedures (including critical care time)  Medications Ordered in ED Medications  acetaminophen  (TYLENOL) tablet 650 mg (650 mg Oral Given 05/03/17 1444)  cefTRIAXone (ROCEPHIN) 1 g in dextrose 5 % 50 mL IVPB (0 g Intravenous Stopped 05/03/17 1659)  sodium chloride 0.9 % bolus 500 mL (500 mLs Intravenous New Bag/Given 05/03/17 1730)     Initial Impression / Assessment and Plan / ED Course  I have reviewed the triage vital signs and the nursing notes.  Pertinent labs & imaging results that were available during my care of the patient were reviewed by me and considered in my medical decision making (see chart for details).  Clinical Course as of May 03 1740  Sun May 03, 2017  1739 Appearance: (!) CLOUDY [LL]    Clinical Course User Index [LL] Volanda Napoleon, PA-C    76 year old female presents with fever, cough, difficulty breathing. Recently diagnosed with bronchitis 2 days ago and started on a Z-Pak. Went to primary care doctor today for further evaluation and was diagnosed with left lower lobe pneumonia. Prompted for ED visit for further evaluation. On initial ED arrival. Patient is febrile at 101.2. Patient initial O2 sat was 91% on room air. While was in the room interviewing patient, her O2 sat dropped down to 88% on room air. Patient is not on any home O2. She was initially placed on 2 L O2 for hypoxia which caused improvement in her O2 sat.. Patient is hypotensive with systolicblood pressure is above 90. Plan to hold fluids at this time given unknown CHF status and rales heard throughout all lung fields. Will plan to initiate labs including lactic acid, CBC, UA, CMP, BNP. Blood cultures ordered. CODE sepsis is initiated. Will check x-rays. Patient started on IV antibiotics for treatment of pneumonia.  Lactic acid reviewed as negative. Given that patient's blood pressure is above 90 and she has a lactic acid less than 4, does not need the 30 mL/kg of IV fluids.   Chest x-ray reviewed. Concerning for left mid and lower lobe pneumonia. Lactic acid normal. BNP is elevated. CBC shows no  leukocytosis. She does have anemia. She has had some diarrhea but denies any blood in stools. CMP shows elevation in BUN and creatinine. Records reviewed show patient has history of elevated kidney function but today's is slightly worse than her baseline. Discussed results with patient. Patient still having O2 sats are in the 92-93% on 2 L room air. Patient increased to 3 L. Patient is mentating appropriately.  Per curb 65 criteria, patient has a score of 3. She will likely require admission for further evaluation. Will page hospitalist.   Discussed patient with Dr. Cathlean Sauer (hospitalist). Will admit patient.  Final Clinical Impressions(s) / ED Diagnoses   Final diagnoses:  Pneumonia of left lung due to infectious organism, unspecified part of lung  Hypoxia    New Prescriptions New Prescriptions   No medications on file     Desma Mcgregor 05/03/17 1958    Fatima Blank, MD 05/04/17 [phone]

## 2017-05-03 NOTE — ED Provider Notes (Signed)
Medical screening examination/treatment/procedure(s) were conducted as a shared visit with non-physician practitioner(s) and myself.  I personally evaluated the patient during the encounter. Briefly, the patient is a 76 y.o. female who is being treated for bronchitis by her primary care provider returns to the emergency department for failed outpatient management and development of pneumonia. Patient also with altered mental status. Febrile upon arrival. Code sepsis initiated. Patient started on enteric antibiotics for community-acquired pneumonia. Does not require 30 mL/kg of IV fluids as her blood pressure was greater than 90 and her lactic acid was less than 4. Admitted for further management     Cardama, Grayce Sessions, MD 05/03/17 1640

## 2017-05-03 NOTE — ED Notes (Signed)
Orders for blood cultures were placed after starting rocephin.  Blood cultures collected and sent but were done after antibiotics were started.  Patient's mentation is well lung fields diminished but no wheezing heard.

## 2017-05-03 NOTE — ED Notes (Signed)
Sent label to main lab to add on INR

## 2017-05-03 NOTE — H&P (Addendum)
History and Physical    Holly Simmons DOB: 1941/02/21 DOA: 05/03/2017  PCP: Etta Grandchild, MD   Holly Simmons coming from: Home/ Primary care office  Chief Complaint: Altered mentation fevers.  HPI: Holly Simmons is a 76 y.o. female with medical history significant of COPD, hypertension, obesity, hypertrophic cardiomyopathy and diabetes mellitus type 2. Holly Simmons was brought by her family sent from her primary care physician's office, 3 days ago Holly Simmons was noted to be febrile and confused, 24 hours later she was taken to the primary care physician where she was diagnosed with bronchitis, she received antibiotic therapy when dose intramuscular and she was sent home with a Z-Pak. Her confusion continued to progress. It was severe in intensity, associated with fevers, malaise and somnolence, no improving or worsening factors, persistent in nature. Most history obtained from her family at the bedside. Holly Simmons only the able to give limited history.     ED Course: Holly Simmons was found febrile, diagnosed with pneumonia, received IV fluids and IV antibiotics. Referred for admission for evaluation.  Review of Systems: Limited due to Holly Simmons's confusion. 1. General: Positive fevers, and chills, no weight gain or weight loss 2. ENT: No runny nose or sore throat, no hearing disturbances 3. Pulmonary: No frank dyspnea, cough, wheezing, or hemoptysis 4. Cardiovascular: No angina, claudication, lower extremity edema, pnd or orthopnea 5. Gastrointestinal: No nausea or vomiting, no diarrhea or constipation 6. Hematology: No easy bruisability or frequent infections 7. Urology: No dysuria, hematuria or increased urinary frequency 8. Dermatology: No rashes. 9. Neurology: No seizures or paresthesias. Positive somnolence and lethargy 10. Musculoskeletal: No joint pain or deformities  Past Medical History:  Diagnosis Date  . Allergic rhinitis   . Asthma    NOS w/ acute exacerbation  .  Blood transfusion without reported diagnosis   . Colon polyps    TUBULAR ADENOMAS AND HYPERPLASTIC  . Complete heart block (HCC)    requiring PPM (MDT) post surgical myomectomy at [facility],  leads are epicardial with abdominal implant, high ventricular threshold at implant  . COPD (chronic obstructive pulmonary disease) (HCC)   . Depressive disorder   . Diastolic dysfunction   . DM (diabetes mellitus) (HCC)   . Gallstones   . GERD (gastroesophageal reflux disease)   . Gout 08/20/2012  . Heart murmur   . Hyperpotassemia   . Hypersomnia   . Hypertension   . Hypertrophic cardiomyopathy (HCC)    s/p surgical myomectomy at [facility] 11/12 complicated by septal VSD post procedure requiring reoperation with patch closure and tricuspid valve replacement  . Kidney stones   . Myocardial infarction (HCC) 2011  . Obstructive sleep apnea    persistent daytime sleepiness despite cpap  . 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 Psoriasis   . Pyuria   . Renal insufficiency   . 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 Typical atrial flutter (HCC) 9/15    Past Surgical History:  Procedure Laterality Date  . ABDOMINAL HYSTERECTOMY    . APPENDECTOMY    . BREAST CYST EXCISION    . CARDIOVERSION N/A 08/01/2014   Procedure: CARDIOVERSION;  Surgeon: Vesta Mixer, MD;  Location: [facility] ENDOSCOPY;  Service: Cardiovascular;  Laterality: N/A;  . CESAREAN SECTION    . CHOLECYSTECTOMY    . COLONOSCOPY    . EYE SURGERY  [id number]   left eye lens implant  . EYE SURGERY  [id number]   right eye lens implant  . INSERT / REPLACE / REMOVE PACEMAKER  07/13/2012  . PACEMAKER INSERTION  07/02/11   epicardial wires with abdominal implant at [facility] 12/12,  high ventricular lead threshold at implant per Dr Christin Fudge  . PERMANENT PACEMAKER INSERTION N/A 07/13/2012   Procedure: PERMANENT PACEMAKER INSERTION;  Surgeon: Hillis Range, MD;  Location: [facility] CATH LAB;  Service: Cardiovascular;  Laterality: N/A;  . POLYPECTOMY    . septal  myomectomy for hypertrophic CM  06/23/11   by Dr Silvestre Mesi at [facility], complicated by septal VSD requiring patch repair and tricuspid valve replacement  . TONSILLECTOMY    . TRICUSPID VALVE REPLACEMENT  11/12   Medtronic 27mm Mosaic tissue valve  . VSD REPAIR  06/2011     reports that she has never smoked. She has never used smokeless tobacco. She reports that she does not drink alcohol or use drugs.  Allergies  Allergen Reactions  . Sulfonamide Derivatives Anaphylaxis and Swelling  . Tetanus Toxoids Swelling  . Other Rash    STERI - STRIPS   . Nsaids Other (See Comments)    REACTION: proteinuria    Family History  Problem Relation Age of Onset  . Hypertension Daughter   . Heart disease Maternal Grandfather   . Heart disease Paternal Grandfather   . Bladder Cancer Father   . Prostate cancer Father   . Cancer Father   . Varicose Veins Father   . Heart attack Father   . Breast cancer Mother   . Dementia Mother   . Hypertension Mother   . Cancer Mother   . Ovarian cancer Maternal Aunt   . Pancreatic cancer Cousin   . Breast cancer Paternal Aunt   . Dementia Maternal Aunt        x 2  . Dementia Maternal Uncle        x 2  . Colon cancer Neg Hx    Unacceptable: Noncontributory, unremarkable, or negative. Acceptable: Family history reviewed and not pertinent (If you reviewed it)  Prior to Admission medications   Medication Sig Start Date End Date Taking? Authorizing Provider  allopurinol (ZYLOPRIM) 100 MG tablet Take 1 tablet (100 mg total) by mouth daily. 10/28/16  Yes Etta Grandchild, MD  B Complex-C (B-COMPLEX WITH VITAMIN C) tablet Take 1 tablet by mouth daily.   Yes [provider]  Betamethasone Valerate 0.12 % foam APPLY TO SCALP AS DIRECTED 07/02/16  Yes Etta Grandchild, MD  buPROPion (WELLBUTRIN XL) 150 MG 24 hr tablet TAKE 1 TABLET ONCE DAILY. Holly Simmons taking differently: TAKE 150 mg TABLET ONCE DAILY. 03/30/17  Yes Etta Grandchild, MD  Cholecalciferol 2000  UNITS TABS Take 1 tablet (2,000 Units total) by mouth daily. 05/31/15  Yes Etta Grandchild, MD  ciclopirox (LOPROX) 0.77 % SUSP Apply 1 Act topically 2 (two) times daily. Holly Simmons taking differently: Apply 1 Act topically as needed.  10/30/16  Yes Etta Grandchild, MD  fexofenadine (ALLEGRA) 180 MG tablet Take 180 mg by mouth daily as needed for allergies.    Yes [provider]  furosemide (LASIX) 40 MG tablet Take 1 tablet (40 mg total) by mouth daily. Holly Simmons taking differently: Take 40 mg by mouth every other day.  03/31/17  Yes Etta Grandchild, MD  ipratropium (ATROVENT) 0.03 % nasal spray Place 2 sprays into both nostrils 3 (three) times daily as needed for rhinitis. 12/17/16  Yes Coralyn Helling, MD  lamoTRIgine (LAMICTAL) 25 MG tablet TAKE 1 TABLET ONCE DAILY. Holly Simmons taking differently: TAKE 25 mg in the evening 02/26/17  Yes Yetta Barre,  Bernadene Bell, MD  linagliptin (TRADJENTA) 5 MG TABS tablet Take 1 tablet (5 mg total) by mouth daily. 10/28/16  Yes Etta Grandchild, MD  metoprolol succinate (TOPROL-XL) 100 MG 24 hr tablet Take 1 tablet (100 mg total) by mouth daily. Take with or immediately following a meal. 10/28/16  Yes Etta Grandchild, MD  modafinil (PROVIGIL) 100 MG tablet Take 1 tablet (100 mg total) by mouth daily. Holly Simmons taking differently: Take 100 mg by mouth as needed.  01/16/17  Yes Coralyn Helling, MD  Multiple Vitamin (MULTIVITAMIN) capsule Take 1 capsule by mouth daily.   Yes [provider]  PRESCRIPTION MEDICATION CPAP   Yes [provider]  ranitidine (ZANTAC) 300 MG tablet TAKE 1 TABLET (300 MG TOTAL) BY MOUTH AT BEDTIME. 09/30/16  Yes Etta Grandchild, MD  spironolactone (ALDACTONE) 25 MG tablet TAKE (1) TABLET EVERY OTHER DAY. 03/30/17  Yes Etta Grandchild, MD  SYMBICORT 80-4.5 MCG/ACT inhaler USE 2 PUFFS TWICE A DAY. 03/30/17  Yes Etta Grandchild, MD  triamcinolone cream (KENALOG) 0.5 % Apply 1 application topically 3 (three) times daily as needed (itching).  09/28/13   Yes Etta Grandchild, MD  VIIBRYD 40 MG TABS TAKE 1 TABLET (40 MG TOTAL) BY MOUTH DAILY. 09/30/16  Yes Etta Grandchild, MD  warfarin (COUMADIN) 2.5 MG tablet Take as directed by coumadin clinic Holly Simmons taking differently: Take 2.5 mg by mouth at bedtime. Take as directed by coumadin clinic 03/31/17  Yes Etta Grandchild, MD  amoxicillin (AMOXIL) 500 MG capsule Take 4 capsule by mouth one hour prior to dental appointment Holly Simmons not taking: Reported on 05/03/2017 04/01/17   Wendall Stade, MD    Physical Exam: Vitals:   05/03/17 1730 05/03/17 1738 05/03/17 1745 05/03/17 1800  BP: (!) 117/56  (!) 118/56 (!) 114/55  Pulse: 60  (!) 59 (!) 59  Resp: (!) 23  (!) 22 18  Temp:      TempSrc:      SpO2: 90%  91% 91%  Weight:  81.6 kg (180 lb)    Height:  5\' 5"  (1.651 m)      Constitutional: deconditioned and ill looking appearing Vitals:   05/03/17 1730 05/03/17 1738 05/03/17 1745 05/03/17 1800  BP: (!) 117/56  (!) 118/56 (!) 114/55  Pulse: 60  (!) 59 (!) 59  Resp: (!) 23  (!) 22 18  Temp:      TempSrc:      SpO2: 90%  91% 91%  Weight:  81.6 kg (180 lb)    Height:  5\' 5"  (1.651 m)     Eyes: PERRL, lids and conjunctivae normal Head normocephalic, no seizures or deformities ENMT: Mucous membranes are dry. Posterior pharynx clear of any exudate or lesions.Normal dentition.  Neck: normal, supple, no masses, no thyromegaly Respiratory: decreased breath sounds on auscultation bilaterally, no wheezing. Positive crackles at the left mid and lower zones. Normal respiratory effort. No accessory muscle use.  Cardiovascular: Regular rate and rhythm, no murmurs / rubs / gallops. No extremity edema. 2+ pedal pulses. No carotid bruits.  Abdomen: no tenderness, no masses palpated. No hepatosplenomegaly. Bowel sounds positive.  Musculoskeletal: no clubbing / cyanosis. No joint deformity upper and lower extremities. Good ROM, no contractures. Normal muscle tone.  Skin: no rashes, lesions, ulcers. No  induration Neurologic: CN 2-12 grossly intact. Sensation intact, DTR normal. Strength 5/5 in all 4. Awake and alert, following commands and non focal, difficult to remember events over the last 48  hours    Labs on Admission: I have personally reviewed following labs and imaging studies  CBC:  Recent Labs Lab 05/03/17 1456  WBC 8.7  NEUTROABS 7.7  HGB 11.2*  HCT 34.7*  MCV 90.6  PLT 108*   Basic Metabolic Panel:  Recent Labs Lab 05/03/17 1456  NA 130*  K 4.1  CL 97*  CO2 23  GLUCOSE 123*  BUN 39*  CREATININE 1.82*  CALCIUM 8.5*   GFR: Estimated Creatinine Clearance: 29 mL/min (A) (by C-G formula based on SCr of 1.82 mg/dL (H)). Liver Function Tests:  Recent Labs Lab 05/03/17 1456  AST 31  ALT 14  ALKPHOS 126  BILITOT 0.7  PROT 7.4  ALBUMIN 3.4*   No results for input(s): LIPASE, AMYLASE in the last 168 hours. No results for input(s): AMMONIA in the last 168 hours. Coagulation Profile: No results for input(s): INR, PROTIME in the last 168 hours. Cardiac Enzymes: No results for input(s): CKTOTAL, CKMB, CKMBINDEX, TROPONINI in the last 168 hours. BNP (last 3 results) No results for input(s): PROBNP in the last 8760 hours. HbA1C: No results for input(s): HGBA1C in the last 72 hours. CBG: No results for input(s): GLUCAP in the last 168 hours. Lipid Profile: No results for input(s): CHOL, HDL, LDLCALC, TRIG, CHOLHDL, LDLDIRECT in the last 72 hours. Thyroid Function Tests: No results for input(s): TSH, T4TOTAL, FREET4, T3FREE, THYROIDAB in the last 72 hours. Anemia Panel: No results for input(s): VITAMINB12, FOLATE, FERRITIN, TIBC, IRON, RETICCTPCT in the last 72 hours. Urine analysis:    Component Value Date/Time   COLORURINE AMBER (A) 05/03/2017 1547   APPEARANCEUR CLOUDY (A) 05/03/2017 1547   LABSPEC 1.020 05/03/2017 1547   PHURINE 5.0 05/03/2017 1547   GLUCOSEU NEGATIVE 05/03/2017 1547   GLUCOSEU NEGATIVE 09/17/2016 1413   HGBUR SMALL (A)  05/03/2017 1547   BILIRUBINUR NEGATIVE 05/03/2017 1547   BILIRUBINUR neg 05/14/2012 1053   KETONESUR NEGATIVE 05/03/2017 1547   PROTEINUR >=300 (A) 05/03/2017 1547   UROBILINOGEN 0.2 09/17/2016 1413   NITRITE NEGATIVE 05/03/2017 1547   LEUKOCYTESUR NEGATIVE 05/03/2017 1547    Radiological Exams on Admission: Dg Chest Portable 1 View  Result Date: 05/03/2017 CLINICAL DATA:  Cough for the past week. Fever. Myalgias. Some shortness of breath. EXAM: PORTABLE CHEST 1 VIEW COMPARISON:  07/14/2012. FINDINGS: Mildly progressive enlargement of the cardiac silhouette. Stable post CABG changes, left subclavian pacemaker lead and epicardial leads. Interval patchy opacity in the lateral aspect of the left mid and lower lung zone. Clear right lung. Unremarkable bones. IMPRESSION: 1. Probable pneumonia in the left mid and lower lung zones. Followup PA and lateral chest X-ray is recommended in 3-4 weeks following trial of antibiotic therapy to ensure resolution and exclude underlying malignancy. 2. Mildly progressive cardiomegaly. Electronically Signed   By: Beckie Salts M.D.   On: 05/03/2017 16:40    EKG: Independently reviewed. Sinus rhythm, right axis deviation, right bundle-branch block.   Assessment/Plan Active Problems:   Sepsis [facility])  76 year old female presents with altered mentation for last 4 days, progressive and worsening, no significant dyspnea or cough. She does have history of hypertrophic cardiomyopathy. On initial physical examination she was febrile, 101.2, blood pressure 113/59, heart rate 62, respiratory rate 18, oxygen saturation 91% on room air. She is pale, dry mucous membranes, lungs had significant rales at the left mid and basal lung, heart S1-S2 present rhythmic, abdomen soft nontender, no lower extremity edema. Sodium 1:30, potassium 4.1, chloride 97, bicarbonate 23, glucose 123, BUN 39,  creatinine 1.8, white count 8.7, hemoglobin 11.2, hematocrit 34.7, platelets 108, his urinalysis  had 6-30 white cells, > 300 protein, specific gravity 1.020. Chest x-ray, shows cardiomegaly, pacemaker in place, left upper and left lower lobe infiltrate.   Holly Simmons will be admitted to the hospital with the working diagnosis of sepsis due to community-acquired pneumonia.  1. Sepsis due to community-acquired pneumonia. Holly Simmons will be admitted to the medical ward, she will be placed on a remote telemetry monitor, IV fluids with normal saline at 75 per hour, IV antibiotic therapy with ceftriaxone and azithromycin, will follow-up on blood cultures, cell count and temperature curve. Neuro checks every 4 hours. Continue low-dose metoprolol.   2. Community-acquired pneumonia. Continue oximetry monitoring and supplemental oxygen per nasal cannula, bronchodilator therapy every 6 hours and as needed every 2 hours with duoneb. Will check urine Legionella and strep pneumo antigens. Holly Simmons with no significant sputum production, if she develops sputum, will send culture.  3. Metabolic encephalopathy due to sepsis. Continue supportive medical care with IV fluids and IV antibiotics, neurochecks every 4 hours. Per Holly Simmons's family at bedside Holly Simmons mentation has been improving, after initiation of IV antibiotics and IV fluids.  4. Acute kidney injury on chronic kidney disease stage III. Will continue IV fluids with crystalloid solutions intravenously, 75 mL per hour. Potassium 4.1, serum bicarbonate 23, we follow kidney function morning, avoid hypotension and nephrotoxic agents. Hold on Holly Simmons's diuretics, furosemide and spironolactone.   5. Type 2 diabetes mellitus. Holly Simmons will placed on insulin signed scale for glucose coverage and monitoring, hold on oral hypoglycemic agents.  6. Atrial flutter. Status post pacemaker/ in the setting of hypertrophic cardiomyopathy status post surgery at [facility] 11/12. Will continue low-dose metoprolol, continue anticoagulation with warfarin, pharmacy consult.   7. Depression.  Continue lamotrigine, bupropion, hold modafinil for now.  DVT prophylaxis: warfarin Code Status: full  Family Communication: I spoke with Holly Simmons's son at bedside, all questions were addressed  Disposition Plan: home  Consults called:   Admission status: Inpatient    Fairy Ashlock Annett Gula MD [facility] Pager [phone]  If 7PM-7AM, please contact night-coverage [email or web address] Password [email or web address]  05/03/2017, 6:22 PM

## 2017-05-04 DIAGNOSIS — I5032 Chronic diastolic (congestive) heart failure: Secondary | ICD-10-CM

## 2017-05-04 DIAGNOSIS — E118 Type 2 diabetes mellitus with unspecified complications: Secondary | ICD-10-CM

## 2017-05-04 DIAGNOSIS — Z950 Presence of cardiac pacemaker: Secondary | ICD-10-CM

## 2017-05-04 DIAGNOSIS — J181 Lobar pneumonia, unspecified organism: Secondary | ICD-10-CM | POA: Diagnosis present

## 2017-05-04 DIAGNOSIS — E669 Obesity, unspecified: Secondary | ICD-10-CM

## 2017-05-04 DIAGNOSIS — D696 Thrombocytopenia, unspecified: Secondary | ICD-10-CM | POA: Diagnosis present

## 2017-05-04 DIAGNOSIS — I421 Obstructive hypertrophic cardiomyopathy: Secondary | ICD-10-CM

## 2017-05-04 DIAGNOSIS — R0902 Hypoxemia: Secondary | ICD-10-CM

## 2017-05-04 DIAGNOSIS — E871 Hypo-osmolality and hyponatremia: Secondary | ICD-10-CM | POA: Diagnosis present

## 2017-05-04 LAB — COMPREHENSIVE METABOLIC PANEL
ALT: 14 U/L (ref 14–54)
AST: 35 U/L (ref 15–41)
Albumin: 3.1 g/dL — ABNORMAL LOW (ref 3.5–5.0)
Alkaline Phosphatase: 115 U/L (ref 38–126)
Anion gap: 10 (ref 5–15)
BUN: 38 mg/dL — ABNORMAL HIGH (ref 6–20)
CHLORIDE: 100 mmol/L — AB (ref 101–111)
CO2: 18 mmol/L — AB (ref 22–32)
CREATININE: 1.59 mg/dL — AB (ref 0.44–1.00)
Calcium: 7.9 mg/dL — ABNORMAL LOW (ref 8.9–10.3)
GFR calc non Af Amer: 31 mL/min — ABNORMAL LOW (ref >60)
GFR, EST AFRICAN AMERICAN: 36 mL/min — AB (ref >60)
Glucose, Bld: 116 mg/dL — ABNORMAL HIGH (ref 65–99)
POTASSIUM: 4.3 mmol/L (ref 3.5–5.1)
SODIUM: 128 mmol/L — AB (ref 135–145)
Total Bilirubin: 0.6 mg/dL (ref 0.3–1.2)
Total Protein: 6.9 g/dL (ref 6.5–8.1)

## 2017-05-04 LAB — GLUCOSE, CAPILLARY
GLUCOSE-CAPILLARY: 94 mg/dL (ref 65–99)
GLUCOSE-CAPILLARY: 99 mg/dL (ref 65–99)
GLUCOSE-CAPILLARY: 127 mg/dL — AB (ref 65–99)
GLUCOSE-CAPILLARY: 181 mg/dL — AB (ref 65–99)

## 2017-05-04 LAB — STREP PNEUMONIAE URINARY ANTIGEN: Strep Pneumo Urinary Antigen: NEGATIVE

## 2017-05-04 LAB — CBC
HEMATOCRIT: 34.4 % — AB (ref 36.0–46.0)
HEMOGLOBIN: 11.6 g/dL — AB (ref 12.0–15.0)
MCH: 30.3 pg (ref 26.0–34.0)
MCHC: 33.7 g/dL (ref 30.0–36.0)
MCV: 89.8 fL (ref 78.0–100.0)
PLATELETS: 89 10*3/uL — AB (ref 150–400)
RBC: 3.83 MIL/uL — AB (ref 3.87–5.11)
RDW: 15.3 % (ref 11.5–15.5)
WBC: 7.6 10*3/uL (ref 4.0–10.5)

## 2017-05-04 LAB — PROTIME-INR
INR: 3.46
Prothrombin Time: 34.5 seconds — ABNORMAL HIGH (ref 11.4–15.2)

## 2017-05-04 MED ORDER — HYDROCODONE-HOMATROPINE 5-1.5 MG/5ML PO SYRP
5.0000 mL | ORAL_SOLUTION | Freq: Four times a day (QID) | ORAL | Status: DC | PRN
  Administered 2017-05-05: 5 mL via ORAL
  Filled 2017-05-04: qty 5

## 2017-05-04 MED ORDER — METHYLPREDNISOLONE SODIUM SUCC 125 MG IJ SOLR
60.0000 mg | Freq: Four times a day (QID) | INTRAMUSCULAR | Status: DC
  Administered 2017-05-04 – 2017-05-05 (×4): 60 mg via INTRAVENOUS
  Filled 2017-05-04 (×4): qty 2

## 2017-05-04 MED ORDER — INFLUENZA VAC SPLIT HIGH-DOSE 0.5 ML IM SUSY
0.5000 mL | PREFILLED_SYRINGE | INTRAMUSCULAR | Status: AC
  Administered 2017-05-06: 0.5 mL via INTRAMUSCULAR
  Filled 2017-05-04: qty 0.5

## 2017-05-04 MED ORDER — VILAZODONE HCL 40 MG PO TABS
40.0000 mg | ORAL_TABLET | Freq: Every day | ORAL | Status: DC
  Administered 2017-05-04 – 2017-05-08 (×5): 40 mg via ORAL
  Filled 2017-05-04 (×5): qty 1

## 2017-05-04 MED ORDER — IPRATROPIUM-ALBUTEROL 0.5-2.5 (3) MG/3ML IN SOLN
3.0000 mL | Freq: Three times a day (TID) | RESPIRATORY_TRACT | Status: DC
  Administered 2017-05-04 – 2017-05-07 (×10): 3 mL via RESPIRATORY_TRACT
  Filled 2017-05-04 (×10): qty 3

## 2017-05-04 NOTE — ED Notes (Signed)
Report attempted 

## 2017-05-04 NOTE — Progress Notes (Signed)
ANTICOAGULATION CONSULT NOTE - Initial Consult  Pharmacy Consult for Warfarin  Indication: atrial flutter  Allergies  Allergen Reactions  . Sulfonamide Derivatives Anaphylaxis and Swelling  . Tetanus Toxoids Swelling  . Other Rash    STERI - STRIPS   . Nsaids Other (See Comments)    REACTION: proteinuria    Patient Measurements: Height: 5\' 5"  (165.1 cm) Weight: 180 lb (81.6 kg) IBW/kg (Calculated) : 57  Vital Signs: Temp: 100.5 F (38.1 C) (10/01 0157) Temp Source: Oral (10/01 0157) BP: 125/60 (10/01 0157) Pulse Rate: 60 (10/01 0157)  Labs:  Recent Labs  05/03/17 1456 05/04/17 0211  HGB 11.2* Simmons*  HCT 34.7* 34.4*  PLT 108* 89*  LABPROT  --  34.5*  INR  --  3.46  CREATININE 1.82* 1.59*    Estimated Creatinine Clearance: 33.2 mL/min (A) (by C-G formula based on SCr of 1.59 mg/dL (H)).   Medical History: Past Medical History:  Diagnosis Date  . Allergic rhinitis   . Asthma    NOS w/ acute exacerbation  . Blood transfusion without reported diagnosis   . Colon polyps    TUBULAR ADENOMAS AND HYPERPLASTIC  . Complete heart block (HCC)    requiring PPM (MDT) post surgical myomectomy at [facility],  leads are epicardial with abdominal implant, high ventricular threshold at implant  . COPD (chronic obstructive pulmonary disease) ([place])   . Depressive disorder   . Diastolic dysfunction   . DM (diabetes mellitus) ([place])   . Gallstones   . GERD (gastroesophageal reflux disease)   . Gout 08/20/2012  . Heart murmur   . Hyperpotassemia   . Hypersomnia   . Hypertension   . Hypertrophic cardiomyopathy ([place])    s/p surgical myomectomy at [facility] 83/38 complicated by septal VSD post procedure requiring reoperation with patch closure and tricuspid valve replacement  . Kidney stones   . Myocardial infarction ([place]) 2011  . Obstructive sleep apnea    persistent daytime sleepiness despite cpap  . 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 Psoriasis   . Pyuria   . Renal insufficiency   .  Tricuspid valve replaced    MDT 50mm Mosaic Valve  . Typical atrial flutter ([place]) 9/15   Assessment: 76 y/o F on warfarin PTA for atrial flutter presents to the ED with Simmons, Holly Simmons, Holly 89.   Goal of Therapy:  INR 2-3 Monitor platelets by anticoagulation protocol: Yes   Plan:  Hold warfarin  Daily PT/INR, resume warfarin as INR allows Monitor for bleeding  Holly Simmons 05/04/2017,2:55 AM

## 2017-05-04 NOTE — Progress Notes (Signed)
Progress Note    Holly Simmons  HKV:[id number] DOB: 1940-08-29  DOA: 05/03/2017 PCP: Etta Grandchild, MD    Brief Narrative:   Chief complaint:  Follow-up altered mental status  Medical records reviewed and are as summarized below:  Holly Simmons is an 76 y.o. female with a PMH of COPD, hypertension, obesity, hypertrophic cardiomyopathy and type 2 diabetes who was admitted 05/03/17 for evaluation of fever and confusion in the setting of recently being diagnosed with bronchitis by her PCP which was treated with an intramuscular antibiotic/Z-Pak. Despite treatment, her confusion progressed and she has become more somnolent. Upon initial evaluation in the ED, she was found to be febrile with chest x-ray showing probable pneumonia in the left mid and lower lung zones.  Assessment/Plan:   Principal Problem:   Sepsis (HCC) Secondary to lobar pneumonia in the setting of failing outpatient treatment with azithromycin Chest x-ray showed lobar pneumonia. Placed on IV Rocephin/azithromycin. Follow-up blood cultures.Still running fevers to 100.18F and tachypneic to 24.  Mildly hypoxic with oxygen saturation of 92%. WBC WNL and lactic acid is not elevated, which is reassuring. Continue nebulizer treatments.  Active Problems:   Hyponatremia Possibly from dehydration and lung process. Hydrate and monitor.    Acute metabolic encephalopathy Secondary to lobar pneumonia and hyponatremia. Treat underlying conditions and monitor.    Acute kidney injury/stage III chronic kidney disease/metabolic acidosis  Furosemide/spironolactone on hold. Creatinine 1.82 on admission. Baseline creatinine appears to be 1.1-1.2. Creatinine improving with IV fluids but not quite back to baseline yet. Continue IV fluids.    Thrombocytopenia May be from acute illness. Platelet count has been normal in the past.    Hypertrophic obstructive cardiomyopathy (HCC)/Chronic diastolic heart failure (HCC) Monitor  fluid balance closely.    Atrial fibrillation (HCC) status post pacemaker Continue metoprolol. Continue warfarin.    Type II diabetes mellitus with manifestations (HCC) Oral agents on hold. Currently being managed with insulin sensitive SSI. CBGs 80s-90s.    Depression Continue Wellbutrin.    GERD Continue Pepcid.    Obesity (BMI 30.0-34.9) Body mass index is 29.95 kg/m.   Family Communication/Anticipated D/C date and plan/Code Status   DVT prophylaxis: Lovenox ordered. Code Status: Full Code.  Family Communication: Lives with husband. Updated him by telephone. Disposition Plan: Home when fever and respiratory status improved.   Medical Consultants:    None.   Anti-Infectives:    Ceftriaxone 05/03/17--->  Azithromycin 05/03/17--->  Subjective:   Very short of breath with audible wheezing, occasional cough. Appetite OK. Having a little diarrhea.  Objective:    Vitals:   05/04/17 0008 05/04/17 0030 05/04/17 0157 05/04/17 0251  BP: 139/68 (!) 148/63 125/60   Pulse: 62 60 60   Resp: (!) 24 (!) 24 18   Temp:   (!) 100.5 F (38.1 C)   TempSrc:   Oral   SpO2: 94% 93% 100% 92%  Weight:      Height:        Intake/Output Summary (Last 24 hours) at 05/04/17 0830 Last data filed at 05/04/17 0300  Gross per 24 hour  Intake          1431.25 ml  Output                0 ml  Net          1431.25 ml   Filed Weights   05/03/17 1738  Weight: 81.6 kg (180 lb)    Exam: General: Moderate respiratory distress,  chronically ill appearing. Cardiovascular: Heart sounds show a regular rate, and rhythm. No gallops or rubs. No murmurs. No JVD. Lungs: Lungs coarse with expiratory wheezes, rhonchi, rales. Patient has increased WOB and cannot speak in complete sentences. Abdomen: Soft, nontender, nondistended with normal active bowel sounds. No masses. No hepatosplenomegaly. Neurological: Alert and oriented 3. Moves all extremities 4 with equal strength. Cranial nerves II  through XII grossly intact. Skin: Ruddy complexion. Warm and dry. No rashes or lesions. Extremities: No clubbing or cyanosis. No edema. + Varicosities. Pedal pulses 2+. Psychiatric: Mood and affect are normal. Insight and judgment are fair.   Data Reviewed:   I have personally reviewed following labs and imaging studies:  Labs: Labs reviewed 05/04/17 show the following:   Basic Metabolic Panel:  Recent Labs Lab 05/03/17 1456 05/04/17 0211  NA 130* 128*  K 4.1 4.3  CL 97* 100*  CO2 23 18*  GLUCOSE 123* 116*  BUN 39* 38*  CREATININE 1.82* 1.59*  CALCIUM 8.5* 7.9*   GFR Estimated Creatinine Clearance: 33.2 mL/min (A) (by C-G formula based on SCr of 1.59 mg/dL (H)). Liver Function Tests:  Recent Labs Lab 05/03/17 1456 05/04/17 0211  AST 31 35  ALT 14 14  ALKPHOS 126 115  BILITOT 0.7 0.6  PROT 7.4 6.9  ALBUMIN 3.4* 3.1*   Coagulation profile  Recent Labs Lab 05/04/17 0211  INR 3.46    CBC:  Recent Labs Lab 05/03/17 1456 05/04/17 0211  WBC 8.7 7.6  NEUTROABS 7.7  --   HGB 11.2* 11.6*  HCT 34.7* 34.4*  MCV 90.6 89.8  PLT 108* 89*   CBG:  Recent Labs Lab 05/03/17 2106 05/04/17 0805  GLUCAP 85 94   Sepsis Labs:  Recent Labs Lab 05/03/17 1456 05/03/17 1514 05/04/17 0211  WBC 8.7  --  7.6  LATICACIDVEN  --  1.44  --     Microbiology No results found for this or any previous visit (from the past 240 hour(s)).  Procedures and diagnostic studies:  05/03/17 CXR: My independent review of the image shows: Left lower lobe pneumonia. Cardiomegaly.     Medications:   . allopurinol  100 mg Oral Daily  . buPROPion  150 mg Oral Daily  . enoxaparin (LOVENOX) injection  40 mg Subcutaneous Q24H  . famotidine  20 mg Oral Daily  . [START ON 05/05/2017] Influenza vac split quadrivalent PF  0.5 mL Intramuscular Tomorrow-1000  . insulin aspart  0-9 Units Subcutaneous TID WC  . ipratropium-albuterol  3 mL Nebulization TID  . lamoTRIgine  25 mg Oral  Daily  . metoprolol tartrate  25 mg Oral BID  . multivitamin with minerals  1 tablet Oral Daily   Continuous Infusions: . sodium chloride 75 mL/hr at 05/04/17 0300  . azithromycin 500 mg (05/04/17 0021)  . cefTRIAXone (ROCEPHIN)  IV Stopped (05/03/17 2241)     LOS: 1 day   Holly Simmons  [facility] Pager [phone]. If unable to reach me by pager, please call my cell phone at [phone].  *Please refer to [email or web address], password [email or web address] to get updated schedule on who will round on this patient, as hospitalists switch teams weekly. If 7PM-7AM, please contact night-coverage at [email or web address], password [email or web address] for any overnight needs.  05/04/2017, 8:30 AM

## 2017-05-05 DIAGNOSIS — Z7901 Long term (current) use of anticoagulants: Secondary | ICD-10-CM

## 2017-05-05 DIAGNOSIS — J189 Pneumonia, unspecified organism: Secondary | ICD-10-CM

## 2017-05-05 LAB — BASIC METABOLIC PANEL
ANION GAP: 12 (ref 5–15)
BUN: 34 mg/dL — ABNORMAL HIGH (ref 6–20)
CALCIUM: 7.8 mg/dL — AB (ref 8.9–10.3)
CO2: 19 mmol/L — ABNORMAL LOW (ref 22–32)
Chloride: 99 mmol/L — ABNORMAL LOW (ref 101–111)
Creatinine, Ser: 1.31 mg/dL — ABNORMAL HIGH (ref 0.44–1.00)
GFR, EST AFRICAN AMERICAN: 46 mL/min — AB (ref >60)
GFR, EST NON AFRICAN AMERICAN: 39 mL/min — AB (ref >60)
GLUCOSE: 190 mg/dL — AB (ref 65–99)
POTASSIUM: 3.4 mmol/L — AB (ref 3.5–5.1)
SODIUM: 130 mmol/L — AB (ref 135–145)

## 2017-05-05 LAB — CBC
HEMATOCRIT: 34.5 % — AB (ref 36.0–46.0)
HEMOGLOBIN: 11.2 g/dL — AB (ref 12.0–15.0)
MCH: 29 pg (ref 26.0–34.0)
MCHC: 32.5 g/dL (ref 30.0–36.0)
MCV: 89.4 fL (ref 78.0–100.0)
Platelets: 95 10*3/uL — ABNORMAL LOW (ref 150–400)
RBC: 3.86 MIL/uL — AB (ref 3.87–5.11)
RDW: 15.2 % (ref 11.5–15.5)
WBC: 5.3 10*3/uL (ref 4.0–10.5)

## 2017-05-05 LAB — PROTIME-INR
INR: 5.03 — AB
PROTHROMBIN TIME: 46.7 s — AB (ref 11.4–15.2)

## 2017-05-05 LAB — LEGIONELLA PNEUMOPHILA SEROGP 1 UR AG: L. pneumophila Serogp 1 Ur Ag: NEGATIVE

## 2017-05-05 LAB — GLUCOSE, CAPILLARY
Glucose-Capillary: 194 mg/dL — ABNORMAL HIGH (ref 65–99)
Glucose-Capillary: 202 mg/dL — ABNORMAL HIGH (ref 65–99)
Glucose-Capillary: 180 mg/dL — ABNORMAL HIGH (ref 65–99)
Glucose-Capillary: 283 mg/dL — ABNORMAL HIGH (ref 65–99)

## 2017-05-05 MED ORDER — METHYLPREDNISOLONE SODIUM SUCC 125 MG IJ SOLR
60.0000 mg | Freq: Two times a day (BID) | INTRAMUSCULAR | Status: DC
  Administered 2017-05-05 – 2017-05-06 (×3): 60 mg via INTRAVENOUS
  Filled 2017-05-05 (×3): qty 2

## 2017-05-05 MED ORDER — WARFARIN - PHARMACIST DOSING INPATIENT: Freq: Every day | Status: DC

## 2017-05-05 MED ORDER — POTASSIUM CHLORIDE CRYS ER 20 MEQ PO TBCR
40.0000 meq | EXTENDED_RELEASE_TABLET | Freq: Once | ORAL | Status: AC
  Administered 2017-05-05: 40 meq via ORAL
  Filled 2017-05-05: qty 2

## 2017-05-05 MED ORDER — FUROSEMIDE 40 MG PO TABS
40.0000 mg | ORAL_TABLET | Freq: Every day | ORAL | Status: DC
  Administered 2017-05-05 – 2017-05-06 (×2): 40 mg via ORAL
  Filled 2017-05-05 (×2): qty 1

## 2017-05-05 NOTE — Progress Notes (Signed)
CRITICAL VALUE ALERT  Critical Value:  INR 5.03  Date & Time Notied:  05/05/2017 at 0609  Provider Notified: Schorr, NP  Orders Received/Actions taken: No new orders given at this time. Will continue to monitor and treat per MD orders.

## 2017-05-05 NOTE — Progress Notes (Signed)
Progress Note    Holly Simmons  [id number]:[id number] DOB: April 21, 1941  DOA: 05/03/2017 PCP: Janith Lima, MD    Brief Narrative:   Chief complaint:  Follow-up altered mental status  Medical records reviewed and are as summarized below:  Holly Simmons is an 76 y.o. female with a PMH of COPD, hypertension, obesity, hypertrophic cardiomyopathy and type 2 diabetes who was admitted 05/03/17 for evaluation of fever and confusion in the setting of recently being diagnosed with bronchitis by her PCP which was treated with an intramuscular antibiotic/Z-Pak. Despite treatment, her confusion progressed and she has become more somnolent. Upon initial evaluation in the ED, she was found to be febrile with chest x-ray showing probable pneumonia in the left mid and lower lung zones.  Assessment/Plan:   Principal Problem:   Sepsis ([place]) Secondary to lobar pneumonia in the setting of failing outpatient treatment with azithromycin Chest x-ray showed lobar pneumonia. Placed on IV Rocephin/azithromycin. Follow-up blood cultures. Afebrile and less tachypneic.  Mildly hypoxic with oxygen saturation of 92%. WBC WNL and lactic acid is not elevated, which is reassuring. Continue nebulizer treatments. Steroids started 05/04/17, will begin to slowly wean. Still wheezing.  Active Problems:   Hypokalemia 40 mEq of potassium ordered for today.    Hyponatremia Possibly from dehydration and lung process. Sodium stable at 130.    Acute metabolic encephalopathy Secondary to lobar pneumonia and hyponatremia. Treat underlying conditions and monitor.    Acute kidney injury/stage III chronic kidney disease/metabolic acidosis  Furosemide/spironolactone on hold. Creatinine 1.82 on admission. Baseline creatinine appears to be 1.1-1.2. Creatinine almost back to baseline now.    Thrombocytopenia May be from acute illness. Platelet count has been normal in the past.    Hypertrophic obstructive cardiomyopathy  (HCC)/Chronic diastolic heart failure (HCC) Strict intake and output and daily weights requested.    Atrial fibrillation ([place]) status post pacemaker Continue metoprolol. INR supratherapeutic, pharmacy dosing Coumadin.    Type II diabetes mellitus with manifestations ([place]) Oral agents on hold. Currently being managed with insulin sensitive SSI. CBGs 85-181.    Depression Continue Wellbutrin.    GERD Continue Pepcid.    Obesity (BMI 30.0-34.9) Body mass index is 29.95 kg/m.   Family Communication/Anticipated D/C date and plan/Code Status   DVT prophylaxis: Coumadin ordered. Code Status: Full Code.  Family Communication: Lives with husband. Updated him by telephone. Disposition Plan: Home when fever and respiratory status improved.   Medical Consultants:    None.   Anti-Infectives:    Ceftriaxone 05/03/17--->  Azithromycin 05/03/17--->  Subjective:   Feels like her wheezing is worse today. No cough. No nausea or vomiting. No BM since admission, but was having loose stools prior to admission. She does feel a bit better than when she first came in.  Objective:    Vitals:   05/04/17 2100 05/04/17 2102 05/04/17 2157 05/05/17 0500  BP: (!) 135/47 (!) 135/47  (!) 115/49  Pulse: 61 61  62  Resp:  16  12  Temp:   98.3 F (36.8 C) 97.9 F (36.6 C)  TempSrc:   Oral Oral  SpO2:  92%  96%  Weight:      Height:       No intake or output data in the 24 hours ending 05/05/17 0805 Filed Weights   05/03/17 1738  Weight: 81.6 kg (180 lb)    Exam: General: No acute distress. Still with increased WOB. Cardiovascular: Heart sounds show a regular rate, and rhythm. No gallops  or rubs. No murmurs. No JVD. Lungs: Course with scattered rhonchi and rales in the bases. Occasional high pitched wheeze.. Abdomen: Soft, nontender, nondistended with normal active bowel sounds. No masses. No hepatosplenomegaly.. Skin: Warm and dry. No rashes or lesions. Extremities: No clubbing or  cyanosis. No edema. Pedal pulses 2+.   Data Reviewed:   I have personally reviewed following labs and imaging studies:  Labs: Labs reviewed 05/04/17 show the following:   Basic Metabolic Panel:  Recent Labs Lab 05/03/17 1456 05/04/17 0211 05/05/17 0517  NA 130* 128* 130*  K 4.1 4.3 3.4*  CL 97* 100* 99*  CO2 23 18* 19*  GLUCOSE 123* 116* 190*  BUN 39* 38* 34*  CREATININE 1.82* 1.59* 1.31*  CALCIUM 8.5* 7.9* 7.8*   GFR Estimated Creatinine Clearance: 40.3 mL/min (A) (by C-G formula based on SCr of 1.31 mg/dL (H)). Liver Function Tests:  Recent Labs Lab 05/03/17 1456 05/04/17 0211  AST 31 35  ALT 14 14  ALKPHOS 126 115  BILITOT 0.7 0.6  PROT 7.4 6.9  ALBUMIN 3.4* 3.1*   Coagulation profile  Recent Labs Lab 05/04/17 0211 05/05/17 0517  INR 3.46 5.03*    CBC:  Recent Labs Lab 05/03/17 1456 05/04/17 0211 05/05/17 0517  WBC 8.7 7.6 5.3  NEUTROABS 7.7  --   --   HGB 11.2* 11.6* 11.2*  HCT 34.7* 34.4* 34.5*  MCV 90.6 89.8 89.4  PLT 108* 89* 95*   CBG:  Recent Labs Lab 05/03/17 2106 05/04/17 0805 05/04/17 1223 05/04/17 1711 05/04/17 2104  GLUCAP 85 94 99 127* 181*   Sepsis Labs:  Recent Labs Lab 05/03/17 1456 05/03/17 1514 05/04/17 0211 05/05/17 0517  WBC 8.7  --  7.6 5.3  LATICACIDVEN  --  1.44  --   --     Microbiology Recent Results (from the past 240 hour(s))  Blood Culture (routine x 2)     Status: None (Preliminary result)   Collection Time: 05/03/17  5:00 PM  Result Value Ref Range Status   Specimen Description BLOOD LEFT ANTECUBITAL  Final   Special Requests   Final    BOTTLES DRAWN AEROBIC AND ANAEROBIC Blood Culture adequate volume   Culture NO GROWTH < 24 HOURS  Final   Report Status PENDING  Incomplete  Blood Culture (routine x 2)     Status: None (Preliminary result)   Collection Time: 05/03/17  5:04 PM  Result Value Ref Range Status   Specimen Description BLOOD LEFT FOREARM  Final   Special Requests IN PEDIATRIC  BOTTLE Blood Culture adequate volume  Final   Culture NO GROWTH < 24 HOURS  Final   Report Status PENDING  Incomplete    Procedures and diagnostic studies:  05/03/17 CXR: My independent review of the image shows: Left lower lobe pneumonia. Cardiomegaly.     Medications:   . allopurinol  100 mg Oral Daily  . buPROPion  150 mg Oral Daily  . enoxaparin (LOVENOX) injection  40 mg Subcutaneous Q24H  . famotidine  20 mg Oral Daily  . Influenza vac split quadrivalent PF  0.5 mL Intramuscular Tomorrow-1000  . insulin aspart  0-9 Units Subcutaneous TID WC  . ipratropium-albuterol  3 mL Nebulization TID  . lamoTRIgine  25 mg Oral Daily  . methylPREDNISolone (SOLU-MEDROL) injection  60 mg Intravenous Q6H  . metoprolol tartrate  25 mg Oral BID  . multivitamin with minerals  1 tablet Oral Daily  . Vilazodone HCl  40 mg Oral Daily  Continuous Infusions: . sodium chloride 10 mL/hr at 05/04/17 1516  . azithromycin Stopped (05/04/17 2200)  . cefTRIAXone (ROCEPHIN)  IV Stopped (05/04/17 2130)     LOS: 2 days   RAMA,CHRISTINA  [facility] Pager [phone]. If unable to reach me by pager, please call my cell phone at [phone].  *Please refer to [email or web address], password [email or web address] to get updated schedule on who will round on this patient, as hospitalists switch teams weekly. If 7PM-7AM, please contact night-coverage at [email or web address], password [email or web address] for any overnight needs.  05/05/2017, 8:05 AM

## 2017-05-05 NOTE — Progress Notes (Signed)
ANTICOAGULATION CONSULT NOTE - Follow up [place] for Warfarin  Indication: atrial flutter  Allergies  Allergen Reactions  . Sulfonamide Derivatives Anaphylaxis and Swelling  . Tetanus Toxoids Swelling  . Other Rash    STERI - STRIPS   . Nsaids Other (See Comments)    REACTION: proteinuria    Patient Measurements: Height: 5\' 5"  (165.1 cm) Weight: 180 lb (81.6 kg) IBW/kg (Calculated) : 57  Vital Signs: Temp: 97.9 F (36.6 C) (10/02 0500) Temp Source: Oral (10/02 0500) BP: 115/49 (10/02 0500) Pulse Rate: 62 (10/02 0500)  Labs:  Recent Labs  05/03/17 1456 05/04/17 0211 05/05/17 0517  HGB 11.2* 11.6* 11.2*  HCT 34.7* 34.4* 34.5*  PLT 108* 89* 95*  LABPROT  --  34.5* 46.7*  INR  --  3.46 5.03*  CREATININE 1.82* 1.59* 1.31*    Estimated Creatinine Clearance: 40.3 mL/min (A) (by C-G formula based on SCr of 1.31 mg/dL (H)).  Assessment: 76 y/o F on warfarin PTA for atrial flutter presents to the ED with PNA, continuing warfarin. INR is supra-therapeutic and trending up now at 5.03. Hgb 11.2, Plts 95.  No bleeding noted.  PTA dose: warfarin 2.5 mg daily  Goal of Therapy:  INR 2-3 Monitor platelets by anticoagulation protocol: Yes   Plan:  Hold warfarin again today Daily PT/INR, resume warfarin as INR allows Monitor for bleeding   Thank you for allowing [place] to participate in this patients care.  Jens Som, PharmD Clinical phone for 05/05/2017 from 7a-3:30p: [phone] If after 3:30p, please call main pharmacy at: [phone] 05/05/2017 8:00 AM

## 2017-05-06 DIAGNOSIS — A419 Sepsis, unspecified organism: Principal | ICD-10-CM

## 2017-05-06 DIAGNOSIS — J181 Lobar pneumonia, unspecified organism: Secondary | ICD-10-CM

## 2017-05-06 DIAGNOSIS — E871 Hypo-osmolality and hyponatremia: Secondary | ICD-10-CM

## 2017-05-06 DIAGNOSIS — D696 Thrombocytopenia, unspecified: Secondary | ICD-10-CM

## 2017-05-06 DIAGNOSIS — I480 Paroxysmal atrial fibrillation: Secondary | ICD-10-CM

## 2017-05-06 DIAGNOSIS — E876 Hypokalemia: Secondary | ICD-10-CM

## 2017-05-06 LAB — PROTIME-INR
INR: 6.17
PROTHROMBIN TIME: 54.3 s — AB (ref 11.4–15.2)

## 2017-05-06 LAB — CBC
HEMATOCRIT: 32.4 % — AB (ref 36.0–46.0)
HEMOGLOBIN: 10.7 g/dL — AB (ref 12.0–15.0)
MCH: 29.2 pg (ref 26.0–34.0)
MCHC: 33 g/dL (ref 30.0–36.0)
MCV: 88.3 fL (ref 78.0–100.0)
Platelets: 139 10*3/uL — ABNORMAL LOW (ref 150–400)
RBC: 3.67 MIL/uL — AB (ref 3.87–5.11)
RDW: 15 % (ref 11.5–15.5)
WBC: 8.2 10*3/uL (ref 4.0–10.5)

## 2017-05-06 LAB — BASIC METABOLIC PANEL
ANION GAP: 11 (ref 5–15)
BUN: 44 mg/dL — ABNORMAL HIGH (ref 6–20)
CALCIUM: 8.2 mg/dL — AB (ref 8.9–10.3)
CO2: 20 mmol/L — AB (ref 22–32)
Chloride: 101 mmol/L (ref 101–111)
Creatinine, Ser: 1.45 mg/dL — ABNORMAL HIGH (ref 0.44–1.00)
GFR calc Af Amer: 40 mL/min — ABNORMAL LOW (ref >60)
GFR, EST NON AFRICAN AMERICAN: 35 mL/min — AB (ref >60)
Glucose, Bld: 193 mg/dL — ABNORMAL HIGH (ref 65–99)
POTASSIUM: 4.1 mmol/L (ref 3.5–5.1)
Sodium: 132 mmol/L — ABNORMAL LOW (ref 135–145)

## 2017-05-06 LAB — GLUCOSE, CAPILLARY
GLUCOSE-CAPILLARY: 194 mg/dL — AB (ref 65–99)
GLUCOSE-CAPILLARY: 270 mg/dL — AB (ref 65–99)
Glucose-Capillary: 196 mg/dL — ABNORMAL HIGH (ref 65–99)
Glucose-Capillary: 249 mg/dL — ABNORMAL HIGH (ref 65–99)

## 2017-05-06 MED ORDER — PREDNISONE 20 MG PO TABS
40.0000 mg | ORAL_TABLET | Freq: Every day | ORAL | Status: DC
  Administered 2017-05-07 – 2017-05-08 (×2): 40 mg via ORAL
  Filled 2017-05-06 (×2): qty 2

## 2017-05-06 NOTE — Progress Notes (Signed)
ANTICOAGULATION CONSULT NOTE - Follow up [place] for Warfarin  Indication: atrial flutter  Allergies  Allergen Reactions  . Sulfonamide Derivatives Anaphylaxis and Swelling  . Tetanus Toxoids Swelling  . Other Rash    STERI - STRIPS   . Nsaids Other (See Comments)    REACTION: proteinuria    Patient Measurements: Height: 5\' 5"  (165.1 cm) Weight: 196 lb (88.9 kg) IBW/kg (Calculated) : 57  Vital Signs: Temp: 99.1 F (37.3 C) (10/03 0536) Temp Source: Oral (10/03 0536) BP: 123/50 (10/03 0536) Pulse Rate: 61 (10/03 0536)  Labs:  Recent Labs  05/04/17 0211 05/05/17 0517 05/06/17 0658  HGB 11.6* 11.2* 10.7*  HCT 34.4* 34.5* 32.4*  PLT 89* 95* 139*  LABPROT 34.5* 46.7* 54.3*  INR 3.46 5.03* 6.17*  CREATININE 1.59* 1.31* 1.45*    Estimated Creatinine Clearance: 38.1 mL/min (A) (by C-G formula based on SCr of 1.45 mg/dL (H)).  Assessment: 76 y/o F on warfarin PTA for atrial flutter presents to the ED with PNA, continuing warfarin. INR is supra-therapeutic and continues to trend up, now at 6.17. Hgb 10.7, Plts 139.  No bleeding noted.  PTA dose: warfarin 2.5 mg daily  Goal of Therapy:  INR 2-3 Monitor platelets by anticoagulation protocol: Yes   Plan:  Hold warfarin again today Daily PT/INR, resume warfarin as INR allows Monitor for bleeding   Thank you for allowing [place] to participate in this patients care.  [employer], Pharm.D., BCPS Clinical Pharmacist Pager: [phone] Clinical phone for 05/06/2017 from 8:30-4:00 is [phone]. After 4pm, please call Main Rx [phone]) for assistance. 05/06/2017 9:29 AM

## 2017-05-06 NOTE — Progress Notes (Signed)
PROGRESS NOTE [facility]   Holly Simmons   UXL:[id number] DOB: 29-Jun-1941  DOA: 05/03/2017 PCP: Janith Lima, MD   Brief Narrative:  Holly Simmons is a 76 year old female with past medical history of hypertension, COPD, hypertrophic cardiomyopathy type 2 diabetes was admitted 05/03/17 for evaluation fever and confusion. Patient was recently diagnosed with bronchitis. Her PCP which was treated with intramuscular antibiotics/Z pack. Right treatment and confusion progressively become more supple. Upon ED evaluaon she was fod to be febrile with chest x-ray showing pneumonia on her left lower lobe. Patient admitted for failing outpatient treatment for pneumonia.   Subjective: Patient seen and examined, report having issues Denies chest pain, shortness of breath, dizziness and palpitation. Wheezing has improved, no cough. Asking when she can go home.  Assessment & Plan: Sepsis ([place]) Secondary to lobar pneumonia in the setting of failing outpatient treatment with azithromycin Chest x-ray showed lobar pneumonia. Placed on IV Rocephin/azithromycin. Azithromycin d/ced today Follow-up blood cultures thus far negative. Continue nebulizer, wean steroids - switched to prednisone, wean O2 as tolerated. Check O2 with ambulation    Hypokalemia - resolve  Monitor   Hyponatremia Possibly from dehydration and lung process. Na stable   Acute metabolic encephalopathy Secondary to lobar pneumonia and hyponatremia. Treat underlying conditions and monitor.  Acute kidney injury/stage III chronic kidney disease/metabolic acidosis  Hold Lasix and aldactone. Creatinine 1.82 on admission.  Cr improving - continue to monitor   Thrombocytopenia May be from acute illness. Platelet count has been normal in the past.  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 INR supratherapeutic, ? If 2/2 azithromycin d/c abx  Check INR in AM - no overt bleeding   Type  II diabetes mellitus with manifestations (HCC) Oral agents on hold. Currently being managed with insulin sensitive SSI.  DVT prophylaxis: Warfarin on hold due to supratherapeutic levels  Code Status: FULL  Family Communication: None at bedside  Disposition Plan: Home in next 1-2 days if INR stabilizes   Consultants:   None   Procedures:   None   Antimicrobials: Anti-infectives    Start     Dose/Rate Route Frequency Ordered Stop   05/03/17 2100  cefTRIAXone (ROCEPHIN) 1 g in dextrose 5 % 50 mL IVPB     1 g 100 mL/hr over 30 Minutes Intravenous Every 24 hours 05/03/17 2047     05/03/17 2100  azithromycin (ZITHROMAX) 500 mg in dextrose 5 % 250 mL IVPB  Status:  Discontinued     500 mg 250 mL/hr over 60 Minutes Intravenous Every 24 hours 05/03/17 2047 05/06/17 1250   05/03/17 1615  cefTRIAXone (ROCEPHIN) 1 g in dextrose 5 % 50 mL IVPB     1 g 100 mL/hr over 30 Minutes Intravenous  Once 05/03/17 1609 05/03/17 1659       Objective: Vitals:   05/06/17 0536 05/06/17 0758 05/06/17 1432 05/06/17 1500  BP: (!) 123/50   (!) 129/51  Pulse: 61   62  Resp: 20   18  Temp: 99.1 F (37.3 C)   98.2 F (36.8 C)  TempSrc: Oral   Oral  SpO2: 93% 93% 95% 93%  Weight:      Height:        Intake/Output Summary (Last 24 hours) at 05/06/17 1813 Last data filed at 05/06/17 1042  Gross per 24 hour  Intake              487 ml  Output  0 ml  Net              487 ml   Filed Weights   05/03/17 1738 05/06/17 0500  Weight: 81.6 kg (180 lb) 88.9 kg (196 lb)    Examination:  General exam: Appears calm and comfortable  HEENT: OP moist and clear Respiratory system: Clear to auscultation. Mild rhonchi at the left lower base. Slight tachypnea  Cardiovascular system: S1 & S2 heard, RRR. No JVD, murmurs, rubs or gallops Gastrointestinal system: Abdomen is nondistended, soft and nontender. Normal bowel sounds heard. Central nervous system: Alert and oriented. No focal neurological  deficits. Extremities: No pedal edema.  Skin: No rashes, lesions or ulcers Psychiatry: Mood & affect appropriate.   Data Reviewed: I have personally reviewed following labs and imaging studies  CBC:  Recent Labs Lab 05/03/17 1456 05/04/17 0211 05/05/17 0517 05/06/17 0658  WBC 8.7 7.6 5.3 8.2  NEUTROABS 7.7  --   --   --   HGB 11.2* 11.6* 11.2* 10.7*  HCT 34.7* 34.4* 34.5* 32.4*  MCV 90.6 89.8 89.4 88.3  PLT 108* 89* 95* 412*   Basic Metabolic Panel:  Recent Labs Lab 05/03/17 1456 05/04/17 0211 05/05/17 0517 05/06/17 0658  NA 130* 128* 130* 132*  K 4.1 4.3 3.4* 4.1  CL 97* 100* 99* 101  CO2 23 18* 19* 20*  GLUCOSE 123* 116* 190* 193*  BUN 39* 38* 34* 44*  CREATININE 1.82* 1.59* 1.31* 1.45*  CALCIUM 8.5* 7.9* 7.8* 8.2*   GFR: Estimated Creatinine Clearance: 38.1 mL/min (A) (by C-G formula based on SCr of 1.45 mg/dL (H)). Liver Function Tests:  Recent Labs Lab 05/03/17 1456 05/04/17 0211  AST 31 35  ALT 14 14  ALKPHOS 126 115  BILITOT 0.7 0.6  PROT 7.4 6.9  ALBUMIN 3.4* 3.1*   No results for input(s): LIPASE, AMYLASE in the last 168 hours. No results for input(s): AMMONIA in the last 168 hours. Coagulation Profile:  Recent Labs Lab 05/04/17 0211 05/05/17 0517 05/06/17 0658  INR 3.46 5.03* 6.17*   Cardiac Enzymes: No results for input(s): CKTOTAL, CKMB, CKMBINDEX, TROPONINI in the last 168 hours. BNP (last 3 results) No results for input(s): PROBNP in the last 8760 hours. HbA1C: No results for input(s): HGBA1C in the last 72 hours. CBG:  Recent Labs Lab 05/05/17 1713 05/05/17 2130 05/06/17 0817 05/06/17 1216 05/06/17 1746  GLUCAP 180* 283* 194* 270* 196*   Lipid Profile: No results for input(s): CHOL, HDL, LDLCALC, TRIG, CHOLHDL, LDLDIRECT in the last 72 hours. Thyroid Function Tests: No results for input(s): TSH, T4TOTAL, FREET4, T3FREE, THYROIDAB in the last 72 hours. Anemia Panel: No results for input(s): VITAMINB12, FOLATE,  FERRITIN, TIBC, IRON, RETICCTPCT in the last 72 hours. Sepsis Labs:  Recent Labs Lab 05/03/17 1514  LATICACIDVEN 1.44    Recent Results (from the past 240 hour(s))  Blood Culture (routine x 2)     Status: None (Preliminary result)   Collection Time: 05/03/17  5:00 PM  Result Value Ref Range Status   Specimen Description BLOOD LEFT ANTECUBITAL  Final   Special Requests   Final    BOTTLES DRAWN AEROBIC AND ANAEROBIC Blood Culture adequate volume   Culture NO GROWTH 3 DAYS  Final   Report Status PENDING  Incomplete  Blood Culture (routine x 2)     Status: None (Preliminary result)   Collection Time: 05/03/17  5:04 PM  Result Value Ref Range Status   Specimen Description BLOOD LEFT FOREARM  Final  Special Requests IN PEDIATRIC BOTTLE Blood Culture adequate volume  Final   Culture NO GROWTH 3 DAYS  Final   Report Status PENDING  Incomplete     Radiology Studies: No results found.   Scheduled Meds: . allopurinol  100 mg Oral Daily  . buPROPion  150 mg Oral Daily  . famotidine  20 mg Oral Daily  . furosemide  40 mg Oral Daily  . insulin aspart  0-9 Units Subcutaneous TID WC  . ipratropium-albuterol  3 mL Nebulization TID  . lamoTRIgine  25 mg Oral Daily  . methylPREDNISolone (SOLU-MEDROL) injection  60 mg Intravenous Q12H  . metoprolol tartrate  25 mg Oral BID  . multivitamin with minerals  1 tablet Oral Daily  . Vilazodone HCl  40 mg Oral Daily  . Warfarin - Pharmacist Dosing Inpatient   Does not apply q1800   Continuous Infusions: . sodium chloride 10 mL/hr at 05/04/17 1516  . cefTRIAXone (ROCEPHIN)  IV Stopped (05/05/17 2206)     LOS: 3 days    Time spent: Total of 25 minutes spent with pt, greater than 50% of which was spent in discussion of  treatment, counseling and coordination of care   Chipper Oman, MD Pager: Text Page via [email or web address]   If 7PM-7AM, please contact night-coverage [email or web address] 05/06/2017, 6:13 PM

## 2017-05-06 NOTE — Progress Notes (Signed)
CRITICAL VALUE ALERT  Critical Value:  INR 6.17  Date & Time Notied:  05/06/17 8:45  Provider Notified: Dr. Quincy Simmonds  Orders Received/Actions taken: Call back MD aware

## 2017-05-07 LAB — CBC
HEMATOCRIT: 35.9 % — AB (ref 36.0–46.0)
HEMOGLOBIN: 11.9 g/dL — AB (ref 12.0–15.0)
MCH: 29.4 pg (ref 26.0–34.0)
MCHC: 33.1 g/dL (ref 30.0–36.0)
MCV: 88.6 fL (ref 78.0–100.0)
Platelets: 159 10*3/uL (ref 150–400)
RBC: 4.05 MIL/uL (ref 3.87–5.11)
RDW: 15.2 % (ref 11.5–15.5)
WBC: 9.7 10*3/uL (ref 4.0–10.5)

## 2017-05-07 LAB — GLUCOSE, CAPILLARY
Glucose-Capillary: 189 mg/dL — ABNORMAL HIGH (ref 65–99)
Glucose-Capillary: 227 mg/dL — ABNORMAL HIGH (ref 65–99)
Glucose-Capillary: 167 mg/dL — ABNORMAL HIGH (ref 65–99)

## 2017-05-07 LAB — PROTIME-INR
INR: 5.64
PROTHROMBIN TIME: 50.6 s — AB (ref 11.4–15.2)

## 2017-05-07 MED ORDER — SODIUM CHLORIDE 0.9 % IV BOLUS (SEPSIS)
250.0000 mL | Freq: Once | INTRAVENOUS | Status: AC
  Administered 2017-05-07: 250 mL via INTRAVENOUS

## 2017-05-07 MED ORDER — IPRATROPIUM-ALBUTEROL 0.5-2.5 (3) MG/3ML IN SOLN: 3.0000 mL | RESPIRATORY_TRACT | Status: DC | PRN

## 2017-05-07 MED ORDER — BUDESONIDE 0.25 MG/2ML IN SUSP
0.2500 mg | Freq: Two times a day (BID) | RESPIRATORY_TRACT | Status: DC
Start: 2017-05-07 — End: 2017-05-08
  Administered 2017-05-07 – 2017-05-08 (×3): 0.25 mg via RESPIRATORY_TRACT
  Filled 2017-05-07 (×3): qty 2

## 2017-05-07 MED ORDER — ARFORMOTEROL TARTRATE 15 MCG/2ML IN NEBU
15.0000 ug | INHALATION_SOLUTION | Freq: Two times a day (BID) | RESPIRATORY_TRACT | Status: DC
  Administered 2017-05-07 – 2017-05-08 (×3): 15 ug via RESPIRATORY_TRACT
  Filled 2017-05-07 (×2): qty 2

## 2017-05-07 MED ORDER — LINAGLIPTIN 5 MG PO TABS
5.0000 mg | ORAL_TABLET | Freq: Every day | ORAL | Status: DC
  Administered 2017-05-07 – 2017-05-08 (×2): 5 mg via ORAL
  Filled 2017-05-07 (×2): qty 1

## 2017-05-07 NOTE — Progress Notes (Signed)
PROGRESS NOTE [facility]   JILDA KRESS   VQQ:[id number] DOB: 1940-09-04  DOA: 05/03/2017 PCP: Janith Lima, MD   Brief Narrative:  Holly Simmons is a 76 year old female with past medical history of hypertension, COPD, hypertrophic cardiomyopathy type 2 diabetes was admitted 05/03/17 for evaluation fever and confusion. Patient was recently diagnosed with bronchitis. Her PCP which was treated with intramuscular antibiotics/Z pack. Right treatment and confusion progressively become more supple. Upon ED evaluaon she was fod to be febrile with chest x-ray showing pneumonia on her left lower lobe. Patient admitted for failing outpatient treatment for pneumonia.   Subjective: No complaints continues to improve. Some wheezing noted when talking. No acute distress, cough improving. Denies chest pain.   Assessment & Plan: Sepsis ([place]) Secondary to lobar pneumonia in the setting of failing outpatient treatment with azithromycin Chest x-ray showed lobar pneumonia. Placed on IV Rocephin/azithromycin. Azithromycin d/ced today Follow-up blood cultures thus far negative. Continue nebulizer, wean steroids - switched to prednisone, wean O2 as tolerated. O2 with ambulation desating to 83% on RA - will need O2 supplement to go home.  Repeat CXR in 3-4 weeks  Patient with mild wheezing - will add brovana and pulmicort   Hypokalemia - resolve  Monitor   Hyponatremia Possibly from dehydration and lung process. Na stable   Acute metabolic encephalopathy - resolved  Secondary to lobar pneumonia and hyponatremia. Treat underlying conditions and monitor.  Acute kidney injury/stage III chronic kidney disease/metabolic acidosis  Hold Lasix and aldactone. Creatinine 1.82 on admission.  Cr improved - monitor BMP in AM   Thrombocytopenia From acute illness, improving. CBC in AM   Atrial fibrillation (HCC) status post pacemaker Continue metoprolol. INR supratherapeutic, ? If 2/2  azithromycin d/c abx  Check INR in AM - no overt bleeding   Type II diabetes mellitus with manifestations (HCC) Oral agents on hold. Continue management with SSI. CBG's elevated likely from prednisone - will resume Tradjenta   DVT prophylaxis: Warfarin on hold due to supratherapeutic levels  Code Status: FULL  Family Communication: None at bedside  Disposition Plan: Home in next 1-2 days if INR stabilizes   Consultants:   None   Procedures:   None   Antimicrobials: Anti-infectives    Start     Dose/Rate Route Frequency Ordered Stop   05/03/17 2100  cefTRIAXone (ROCEPHIN) 1 g in dextrose 5 % 50 mL IVPB     1 g 100 mL/hr over 30 Minutes Intravenous Every 24 hours 05/03/17 2047     05/03/17 2100  azithromycin (ZITHROMAX) 500 mg in dextrose 5 % 250 mL IVPB  Status:  Discontinued     500 mg 250 mL/hr over 60 Minutes Intravenous Every 24 hours 05/03/17 2047 05/06/17 1250   05/03/17 1615  cefTRIAXone (ROCEPHIN) 1 g in dextrose 5 % 50 mL IVPB     1 g 100 mL/hr over 30 Minutes Intravenous  Once 05/03/17 1609 05/03/17 1659      Objective: Vitals:   05/07/17 0859 05/07/17 1158 05/07/17 1200 05/07/17 1451  BP:    (!) 130/50  Pulse:    63  Resp:    16  Temp:    98.1 F (36.7 C)  TempSrc:    Oral  SpO2: 98% 92% 92% 94%  Weight:      Height:        Intake/Output Summary (Last 24 hours) at 05/07/17 1641 Last data filed at 05/06/17 1800  Gross per 24 hour  Intake  73 ml  Output                0 ml  Net               73 ml   Filed Weights   05/03/17 1738 05/06/17 0500 05/07/17 0500  Weight: 81.6 kg (180 lb) 88.9 kg (196 lb) 88.7 kg (195 lb 8 oz)    Examination:  General: NAD  Cardiovascular: RRR, S1/S2 +, no rubs, no gallops Respiratory: Good air entry, mild wheezing at the b/l LL, mild rales at LLL  Abdominal: Soft, NT, ND, bowel sounds + Extremities: no edema,  Data Reviewed: I have personally reviewed following labs and imaging  studies  CBC:  Recent Labs Lab 05/03/17 1456 05/04/17 0211 05/05/17 0517 05/06/17 0658  WBC 8.7 7.6 5.3 8.2  NEUTROABS 7.7  --   --   --   HGB 11.2* 11.6* 11.2* 10.7*  HCT 34.7* 34.4* 34.5* 32.4*  MCV 90.6 89.8 89.4 88.3  PLT 108* 89* 95* 132*   Basic Metabolic Panel:  Recent Labs Lab 05/03/17 1456 05/04/17 0211 05/05/17 0517 05/06/17 0658  NA 130* 128* 130* 132*  K 4.1 4.3 3.4* 4.1  CL 97* 100* 99* 101  CO2 23 18* 19* 20*  GLUCOSE 123* 116* 190* 193*  BUN 39* 38* 34* 44*  CREATININE 1.82* 1.59* 1.31* 1.45*  CALCIUM 8.5* 7.9* 7.8* 8.2*   GFR: Estimated Creatinine Clearance: 38 mL/min (A) (by C-G formula based on SCr of 1.45 mg/dL (H)). Liver Function Tests:  Recent Labs Lab 05/03/17 1456 05/04/17 0211  AST 31 35  ALT 14 14  ALKPHOS 126 115  BILITOT 0.7 0.6  PROT 7.4 6.9  ALBUMIN 3.4* 3.1*   No results for input(s): LIPASE, AMYLASE in the last 168 hours. No results for input(s): AMMONIA in the last 168 hours. Coagulation Profile:  Recent Labs Lab 05/04/17 0211 05/05/17 0517 05/06/17 0658 05/07/17 0545  INR 3.46 5.03* 6.17* 5.64*   Cardiac Enzymes: No results for input(s): CKTOTAL, CKMB, CKMBINDEX, TROPONINI in the last 168 hours. BNP (last 3 results) No results for input(s): PROBNP in the last 8760 hours. HbA1C: No results for input(s): HGBA1C in the last 72 hours. CBG:  Recent Labs Lab 05/06/17 1216 05/06/17 1746 05/06/17 2215 05/07/17 0753 05/07/17 1148  GLUCAP 270* 196* 249* 189* 227*   Lipid Profile: No results for input(s): CHOL, HDL, LDLCALC, TRIG, CHOLHDL, LDLDIRECT in the last 72 hours. Thyroid Function Tests: No results for input(s): TSH, T4TOTAL, FREET4, T3FREE, THYROIDAB in the last 72 hours. Anemia Panel: No results for input(s): VITAMINB12, FOLATE, FERRITIN, TIBC, IRON, RETICCTPCT in the last 72 hours. Sepsis Labs:  Recent Labs Lab 05/03/17 1514  LATICACIDVEN 1.44    Recent Results (from the past 240 hour(s))   Blood Culture (routine x 2)     Status: None (Preliminary result)   Collection Time: 05/03/17  5:00 PM  Result Value Ref Range Status   Specimen Description BLOOD LEFT ANTECUBITAL  Final   Special Requests   Final    BOTTLES DRAWN AEROBIC AND ANAEROBIC Blood Culture adequate volume   Culture NO GROWTH 4 DAYS  Final   Report Status PENDING  Incomplete  Blood Culture (routine x 2)     Status: None (Preliminary result)   Collection Time: 05/03/17  5:04 PM  Result Value Ref Range Status   Specimen Description BLOOD LEFT FOREARM  Final   Special Requests IN PEDIATRIC BOTTLE Blood Culture adequate volume  Final  Culture NO GROWTH 4 DAYS  Final   Report Status PENDING  Incomplete     Radiology Studies: No results found.   Scheduled Meds: . allopurinol  100 mg Oral Daily  . arformoterol  15 mcg Nebulization BID  . budesonide (PULMICORT) nebulizer solution  0.25 mg Nebulization BID  . buPROPion  150 mg Oral Daily  . famotidine  20 mg Oral Daily  . insulin aspart  0-9 Units Subcutaneous TID WC  . lamoTRIgine  25 mg Oral Daily  . metoprolol tartrate  25 mg Oral BID  . multivitamin with minerals  1 tablet Oral Daily  . predniSONE  40 mg Oral Q breakfast  . Vilazodone HCl  40 mg Oral Daily  . Warfarin - Pharmacist Dosing Inpatient   Does not apply q1800   Continuous Infusions: . sodium chloride 10 mL/hr at 05/04/17 1516  . cefTRIAXone (ROCEPHIN)  IV Stopped (05/06/17 2326)     LOS: 4 days    Time spent: Total of 25 minutes spent with pt, greater than 50% of which was spent in discussion of  treatment, counseling and coordination of care   Chipper Oman, MD Pager: Text Page via [email or web address]   If 7PM-7AM, please contact night-coverage [email or web address] 05/07/2017, 4:41 PM

## 2017-05-07 NOTE — Care Management Important Message (Signed)
Important Message  Patient Details  Name: Holly Simmons MRN: [id number] Date of Birth: Feb 02, 1941   Medicare Important Message Given:  Yes    Nathen May 05/07/2017, 10:45 AM

## 2017-05-07 NOTE — Progress Notes (Signed)
CRITICAL VALUE ALERT  Critical Value: INR 5.64  PT: 55.06  Date & Time Notied: 05/07/17  9629  Provider Notified: Lamar Blinks   Orders Received/Actions taken:

## 2017-05-07 NOTE — Progress Notes (Signed)
Pt desatted to 83% on room air with ambulation. Assisted back to room and placed on 2L of O2 and oxygen sat increased to 93%-94%. Pt in no acute distress at this time. Continues to have nonproductive cough.

## 2017-05-07 NOTE — Progress Notes (Signed)
ANTICOAGULATION CONSULT NOTE - Follow up [facility] for Warfarin  Indication: atrial flutter  Allergies  Allergen Reactions  . Sulfonamide Derivatives Anaphylaxis and Swelling  . Tetanus Toxoids Swelling  . Other Rash    STERI - STRIPS   . Nsaids Other (See Comments)    REACTION: proteinuria    Patient Measurements: Height: 5\' 5"  (165.1 cm) Weight: 195 lb 8 oz (88.7 kg) IBW/kg (Calculated) : 57  Vital Signs: Temp: 98.4 F (36.9 C) (10/04 0500) Temp Source: Oral (10/04 0500) BP: 140/54 (10/04 0500) Pulse Rate: 60 (10/04 0500)  Labs:  Recent Labs  05/05/17 0517 05/06/17 0658 05/07/17 0545  HGB 11.2* 10.7*  --   HCT 34.5* 32.4*  --   PLT 95* 139*  --   LABPROT 46.7* 54.3* 50.6*  INR 5.03* 6.17* 5.64*  CREATININE 1.31* 1.45*  --     Estimated Creatinine Clearance: 38 mL/min (A) (by C-G formula based on SCr of 1.45 mg/dL (H)).  Assessment: 76 y/o F on warfarin PTA for atrial flutter presents to the ED with PNA, continuing warfarin. INR is supra-therapeutic but is trending down, now at 5.64. Hgb 10.7, Plts 139.  No bleeding noted.  PTA dose: warfarin 2.5 mg daily  Goal of Therapy:  INR 2-3 Monitor platelets by anticoagulation protocol: Yes   Plan:  Hold warfarin again today Daily PT/INR, resume warfarin as INR allows Monitor for bleeding   Thank you for allowing [place] to participate in this patients care.  Jens Som, PharmD Clinical phone for 05/07/2017 from 7a-3:30p: [phone] If after 3:30p, please call main pharmacy at: [phone] 05/07/2017 9:29 AM

## 2017-05-08 LAB — BASIC METABOLIC PANEL
Anion gap: 5 (ref 5–15)
BUN: 39 mg/dL — ABNORMAL HIGH (ref 6–20)
CALCIUM: 8.2 mg/dL — AB (ref 8.9–10.3)
CHLORIDE: 102 mmol/L (ref 101–111)
CO2: 25 mmol/L (ref 22–32)
CREATININE: 1.2 mg/dL — AB (ref 0.44–1.00)
GFR, EST AFRICAN AMERICAN: 51 mL/min — AB (ref >60)
GFR, EST NON AFRICAN AMERICAN: 44 mL/min — AB (ref >60)
Glucose, Bld: 152 mg/dL — ABNORMAL HIGH (ref 65–99)
Potassium: 4.3 mmol/L (ref 3.5–5.1)
SODIUM: 132 mmol/L — AB (ref 135–145)

## 2017-05-08 LAB — GLUCOSE, CAPILLARY
GLUCOSE-CAPILLARY: 184 mg/dL — AB (ref 65–99)
GLUCOSE-CAPILLARY: 131 mg/dL — AB (ref 65–99)
GLUCOSE-CAPILLARY: 152 mg/dL — AB (ref 65–99)

## 2017-05-08 LAB — CULTURE, BLOOD (ROUTINE X 2)
CULTURE: NO GROWTH
CULTURE: NO GROWTH
SPECIAL REQUESTS: ADEQUATE
Special Requests: ADEQUATE

## 2017-05-08 LAB — PROTIME-INR
INR: 5.61
Prothrombin Time: 50.4 seconds — ABNORMAL HIGH (ref 11.4–15.2)

## 2017-05-08 MED ORDER — HYDROCODONE-HOMATROPINE 5-1.5 MG/5ML PO SYRP: 5.0000 mL | ORAL_SOLUTION | Freq: Four times a day (QID) | ORAL | 0 refills | Status: DC | PRN

## 2017-05-08 MED ORDER — ARFORMOTEROL TARTRATE 15 MCG/2ML IN NEBU: 15.0000 ug | INHALATION_SOLUTION | Freq: Two times a day (BID) | RESPIRATORY_TRACT | 0 refills | Status: DC

## 2017-05-08 MED ORDER — BUDESONIDE 0.25 MG/2ML IN SUSP: 0.2500 mg | Freq: Two times a day (BID) | RESPIRATORY_TRACT | 0 refills | Status: DC

## 2017-05-08 MED ORDER — FUROSEMIDE 20 MG PO TABS
20.0000 mg | ORAL_TABLET | Freq: Every day | ORAL | Status: DC
  Administered 2017-05-08: 20 mg via ORAL
  Filled 2017-05-08: qty 1

## 2017-05-08 MED ORDER — METOPROLOL TARTRATE 25 MG PO TABS: 25.0000 mg | ORAL_TABLET | Freq: Two times a day (BID) | ORAL | 0 refills | Status: DC

## 2017-05-08 MED ORDER — CEFPODOXIME PROXETIL 200 MG PO TABS: 200.0000 mg | ORAL_TABLET | Freq: Two times a day (BID) | ORAL | 0 refills | Status: AC

## 2017-05-08 MED ORDER — PREDNISONE 10 MG PO TABS: ORAL_TABLET | ORAL | 0 refills | Status: DC

## 2017-05-08 MED ORDER — FUROSEMIDE 40 MG PO TABS: 20.0000 mg | ORAL_TABLET | Freq: Every day | ORAL | 0 refills | Status: DC

## 2017-05-08 NOTE — Discharge Summary (Signed)
Physician Discharge Summary  Holly Simmons  JIR:[id number]  DOB: 1940-11-14  DOA: 05/03/2017 PCP: Janith Lima, MD  Admit date: 05/03/2017 Discharge date: 05/08/2017  Admitted From: Home  Disposition:  Home   Recommendations for Outpatient Follow-up:  1. Follow up with PCP on 05/11/17 @ 11:00 AM  2. Please obtain BMP/CBC/INR  3. Repeat CXR in 3-4 weeks   Home Health: Home  Equipment/Devices: Home    Discharge Condition: Stable  CODE STATUS: FULL  Diet recommendation: Heart Healthy   Brief/Interim Summary: Holly Simmons is a 76 year old female with past medical history of hypertension, COPD, hypertrophic cardiomyopathy type 2 diabetes was admitted 05/03/17 for evaluation fever and confusion. Patient was recently diagnosed with bronchitis. Her PCP which was treated with intramuscular antibiotics/Z pack. Despite her treatement confusion progressively become more somnolent. Upon ED evaluation she was found to be febrile with chest x-ray showing pneumonia on her left lower lobe. Patient admitted for failing outpatient treatment for pneumonia. During hospital stay INR was found to be elevated, felt to be 2/2 azithromycin. Patient INR was monitored daily, patient has not have any overt bleeding. INR trending down slowly. Patient respiratory status has significantly improved, pulse ox was check with ambulation and patient required O2.. Since patient is clinically stable, no signs of bleeding and follow up appointment has been scheduled she will be discharge home. Continue to hold Warfarin.   Subjective: Patient seen and examined, feeling much improved, report her breathing is doing well. Afebrile. No acute events overnight. Patient tolerating diet well. Ambulating with no issues.   Discharge Diagnoses/Hospital Course:  Sepsis ([place]) Secondary to lobar pneumonia in the setting of failing outpatient treatment with azithromycin Chest x-ray showed lobar pneumonia. Placed on IV  Rocephin/azithromycin. Azithromycin d/ced after 3 doses. Follow-up blood cultures thus far negative. Continue nebulizer, wean steroids - switched to prednisone, wean O2 as tolerated. O2 with ambulation desating to 83% on RA - will need O2 supplement to go home.  Repeat CXR in 3-4 weeks  Will d/c on brovana and Pulmicort daily for 3-5 days, then can switch to Symbicort recommend to increase dose Follow up with PCP   Hypokalemia - resolve  Monitor   Hyponatremia - Improved  Possibly from dehydration and lung process. Na stable   Acute metabolic encephalopathy - resolved  Secondary to lobar pneumonia and hyponatremia. Treat underlying conditions and monitor.  Acute kidney injury/stage III chronic kidney disease/metabolic acidosis  Hold Lasix and aldactone. Creatinine 1.82 on admission.  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 INR supratherapeutic, ?  2/2 azithromycin d/c abx  Check INR in AM - no overt bleeding   Type II diabetes mellitus with manifestations ([place]) Oral agents on hold. Continue management with SSI. CBG's elevated likely from prednisone - will resume Tradjenta   All other chronic medical condition were stable during the hospitalization.  On the day of the discharge the patient's vitals were stable, and no other acute medical condition were reported by patient. Patient was felt safe to be discharge to home   Discharge Instructions  You were cared for by a hospitalist during your hospital stay. If you have any questions about your discharge medications or the care you received while you were in the hospital after you are discharged, you can call the unit and asked to speak with the hospitalist on call if the hospitalist that took care of  you is not available. Once you are discharged, your primary care physician will handle any further  medical issues. Please note that NO REFILLS for any discharge medications will be authorized once you are discharged, as it is imperative that you return to your primary care physician (or establish a relationship with a primary care physician if you do not have one) for your aftercare needs so that they can reassess your need for medications and monitor your lab values.   Allergies as of 05/08/2017      Reactions   Sulfonamide Derivatives Anaphylaxis, Swelling   Tetanus Toxoids Swelling   Other Rash   STERI - STRIPS    Nsaids Other (See Comments)   REACTION: proteinuria      Medication List    STOP taking these medications   metoprolol succinate 100 MG 24 hr tablet Commonly known as:  TOPROL-XL   spironolactone 25 MG tablet Commonly known as:  ALDACTONE   SYMBICORT 80-4.5 MCG/ACT inhaler Generic drug:  budesonide-formoterol   warfarin 2.5 MG tablet Commonly known as:  COUMADIN     TAKE these medications   allopurinol 100 MG tablet Commonly known as:  ZYLOPRIM Take 1 tablet (100 mg total) by mouth daily.   arformoterol 15 MCG/2ML Nebu Commonly known as:  BROVANA Take 2 mLs (15 mcg total) by nebulization 2 (two) times daily.   B-complex with vitamin C tablet Take 1 tablet by mouth daily.   Betamethasone Valerate 0.12 % foam APPLY TO SCALP AS DIRECTED   budesonide 0.25 MG/2ML nebulizer solution Commonly known as:  PULMICORT Take 2 mLs (0.25 mg total) by nebulization 2 (two) times daily.   buPROPion 150 MG 24 hr tablet Commonly known as:  WELLBUTRIN XL TAKE 1 TABLET ONCE DAILY. What changed:  See the new instructions.   cefpodoxime 200 MG tablet Commonly known as:  VANTIN Take 1 tablet (200 mg total) by mouth 2 (two) times daily.   Cholecalciferol 2000 units Tabs Take 1 tablet (2,000 Units total) by mouth daily.   ciclopirox 0.77 % Susp Commonly known as:  LOPROX Apply 1 Act topically 2 (two) times daily. What changed:  when to take this  reasons to take  this   fexofenadine 180 MG tablet Commonly known as:  ALLEGRA Take 180 mg by mouth daily as needed for allergies.   furosemide 40 MG tablet Commonly known as:  LASIX Take 0.5 tablets (20 mg total) by mouth daily. What changed:  how much to take   HYDROcodone-homatropine 5-1.5 MG/5ML syrup Commonly known as:  HYCODAN Take 5 mLs by mouth every 6 (six) hours as needed for cough.   ipratropium 0.03 % nasal spray Commonly known as:  ATROVENT Place 2 sprays into both nostrils 3 (three) times daily as needed for rhinitis.   lamoTRIgine 25 MG tablet Commonly known as:  LAMICTAL TAKE 1 TABLET ONCE DAILY. What changed:  See the new instructions.   linagliptin 5 MG Tabs tablet Commonly known as:  TRADJENTA Take 1 tablet (5 mg total) by mouth daily.   metoprolol tartrate 25 MG tablet Commonly known as:  LOPRESSOR Take 1 tablet (25 mg total) by mouth 2 (two) times daily.   modafinil 100 MG tablet Commonly known as:  PROVIGIL Take 1 tablet (100 mg total) by mouth daily. What changed:  when to take this  reasons to take this   multivitamin capsule Take 1 capsule by mouth daily.   predniSONE 10 MG tablet Commonly known as:  DELTASONE Take 4 tablets  for 3 days; Take 3 tablets for 4 days; Take 2 tablets for 3 days; Take 1 tablet for 4 days   PRESCRIPTION MEDICATION CPAP   ranitidine 300 MG tablet Commonly known as:  ZANTAC TAKE 1 TABLET (300 MG TOTAL) BY MOUTH AT BEDTIME.   triamcinolone cream 0.5 % Commonly known as:  KENALOG Apply 1 application topically 3 (three) times daily as needed (itching).   VIIBRYD 40 MG Tabs Generic drug:  Vilazodone HCl TAKE 1 TABLET (40 MG TOTAL) BY MOUTH DAILY.            Durable Medical Equipment        Start     Ordered   05/08/17 1127  For home use only DME Nebulizer machine  Once    Question:  Patient needs a nebulizer to treat with the following condition  Answer:  PNA (pneumonia)   05/08/17 1127     Follow-up Information     Nche, Charlene Brooke, NP. Go on 05/11/2017.   Specialty:  Internal Medicine Why:  @ 11:00. Check INR, hospital follow up  Contact information: [address] [place] [address] [phone]        [facility] Follow up.   Why:  nebulizer, portable oxygen tank to be delivered to bedside prior to discharge Contact information: [place] [address] [phone]          Allergies  Allergen Reactions  . Sulfonamide Derivatives Anaphylaxis and Swelling  . Tetanus Toxoids Swelling  . Other Rash    STERI - STRIPS   . Nsaids Other (See Comments)    REACTION: proteinuria    Consultations:  None    Procedures/Studies: Dg Chest Portable 1 View  Result Date: 05/03/2017 CLINICAL DATA:  Cough for the past week. Fever. Myalgias. Some shortness of breath. EXAM: PORTABLE CHEST 1 VIEW COMPARISON:  07/14/2012. FINDINGS: Mildly progressive enlargement of the cardiac silhouette. Stable post CABG changes, left subclavian pacemaker lead and epicardial leads. Interval patchy opacity in the lateral aspect of the left mid and lower lung zone. Clear right lung. Unremarkable bones. IMPRESSION: 1. Probable pneumonia in the left mid and lower lung zones. Followup PA and lateral chest X-ray is recommended in 3-4 weeks following trial of antibiotic therapy to ensure resolution and exclude underlying malignancy. 2. Mildly progressive cardiomegaly. Electronically Signed   By: Claudie Revering M.D.   On: 05/03/2017 16:40   Mm Screening Breast Tomo Bilateral  Result Date: 04/16/2017 CLINICAL DATA:  Screening. EXAM: 2D DIGITAL SCREENING BILATERAL MAMMOGRAM WITH CAD AND ADJUNCT TOMO COMPARISON:  Previous exam(s). ACR Breast Density Category b: There are scattered areas of fibroglandular density. FINDINGS: There are no findings suspicious for malignancy. Images were processed with CAD. IMPRESSION: No mammographic evidence of malignancy. A result letter of this screening  mammogram will be mailed directly to the patient. RECOMMENDATION: Screening mammogram in one year. (Code:[id number]-B-01Y) BI-RADS CATEGORY  1: Negative. Electronically Signed   By: Abelardo Diesel M.D.   On: 04/16/2017 11:24     Discharge Exam: Vitals:   05/08/17 0953 05/08/17 1419  BP: (!) 143/58 (!) 142/52  Pulse: 67 63  Resp:    Temp:  (!) 97.5 F (36.4 C)  SpO2:  94%   Vitals:   05/08/17 0441 05/08/17 0937 05/08/17 0953 05/08/17 1419  BP:   (!) 143/58 (!) 142/52  Pulse:  61 67 63  Resp:  16    Temp:    (!) 97.5 F (  36.4 C)  TempSrc:    Oral  SpO2:  94%  94%  Weight: 88.4 kg (194 lb 14.4 oz)     Height:        General: NAD Cardiovascular: RRR, S1/S2 +, no rubs, no gallops Respiratory: Good air entry mild wheezing at the bases  Abdominal: Soft, NT, ND, bowel sounds + Extremities: no edema.  The results of significant diagnostics from this hospitalization (including imaging, microbiology, ancillary and laboratory) are listed below for reference.     Microbiology: Recent Results (from the past 240 hour(s))  Blood Culture (routine x 2)     Status: None (Preliminary result)   Collection Time: 05/03/17  5:00 PM  Result Value Ref Range Status   Specimen Description BLOOD LEFT ANTECUBITAL  Final   Special Requests   Final    BOTTLES DRAWN AEROBIC AND ANAEROBIC Blood Culture adequate volume   Culture NO GROWTH 4 DAYS  Final   Report Status PENDING  Incomplete  Blood Culture (routine x 2)     Status: None (Preliminary result)   Collection Time: 05/03/17  5:04 PM  Result Value Ref Range Status   Specimen Description BLOOD LEFT FOREARM  Final   Special Requests IN PEDIATRIC BOTTLE Blood Culture adequate volume  Final   Culture NO GROWTH 4 DAYS  Final   Report Status PENDING  Incomplete     Labs: BNP (last 3 results)  Recent Labs  05/03/17 1556  BNP 606.3*   Basic Metabolic Panel:  Recent Labs Lab 05/03/17 1456 05/04/17 0211 05/05/17 0517 05/06/17 0658  05/08/17 0550  NA 130* 128* 130* 132* 132*  K 4.1 4.3 3.4* 4.1 4.3  CL 97* 100* 99* 101 102  CO2 23 18* 19* 20* 25  GLUCOSE 123* 116* 190* 193* 152*  BUN 39* 38* 34* 44* 39*  CREATININE 1.82* 1.59* 1.31* 1.45* 1.20*  CALCIUM 8.5* 7.9* 7.8* 8.2* 8.2*   Liver Function Tests:  Recent Labs Lab 05/03/17 1456 05/04/17 0211  AST 31 35  ALT 14 14  ALKPHOS 126 115  BILITOT 0.7 0.6  PROT 7.4 6.9  ALBUMIN 3.4* 3.1*   No results for input(s): LIPASE, AMYLASE in the last 168 hours. No results for input(s): AMMONIA in the last 168 hours. CBC:  Recent Labs Lab 05/03/17 1456 05/04/17 0211 05/05/17 0517 05/06/17 0658 05/07/17 1730  WBC 8.7 7.6 5.3 8.2 9.7  NEUTROABS 7.7  --   --   --   --   HGB 11.2* 11.6* 11.2* 10.7* 11.9*  HCT 34.7* 34.4* 34.5* 32.4* 35.9*  MCV 90.6 89.8 89.4 88.3 88.6  PLT 108* 89* 95* 139* 159   Cardiac Enzymes: No results for input(s): CKTOTAL, CKMB, CKMBINDEX, TROPONINI in the last 168 hours. BNP: Invalid input(s): POCBNP CBG:  Recent Labs Lab 05/07/17 1148 05/07/17 1700 05/07/17 2229 05/08/17 0823 05/08/17 1215  GLUCAP 227* 167* 184* 131* 152*   D-Dimer No results for input(s): DDIMER in the last 72 hours. Hgb A1c No results for input(s): HGBA1C in the last 72 hours. Lipid Profile No results for input(s): CHOL, HDL, LDLCALC, TRIG, CHOLHDL, LDLDIRECT in the last 72 hours. Thyroid function studies No results for input(s): TSH, T4TOTAL, T3FREE, THYROIDAB in the last 72 hours.  Invalid input(s): FREET3 Anemia work up No results for input(s): VITAMINB12, FOLATE, FERRITIN, TIBC, IRON, RETICCTPCT in the last 72 hours. Urinalysis    Component Value Date/Time   COLORURINE AMBER (A) 05/03/2017 1547   APPEARANCEUR CLOUDY (A) 05/03/2017 1547   LABSPEC  1.020 05/03/2017 1547   PHURINE 5.0 05/03/2017 1547   GLUCOSEU NEGATIVE 05/03/2017 1547   GLUCOSEU NEGATIVE 09/17/2016 1413   HGBUR SMALL (A) 05/03/2017 1547   BILIRUBINUR NEGATIVE 05/03/2017  1547   BILIRUBINUR neg 05/14/2012 [facility] 05/03/2017 1547   PROTEINUR >=300 (A) 05/03/2017 1547   UROBILINOGEN 0.2 09/17/2016 1413   NITRITE NEGATIVE 05/03/2017 1547   LEUKOCYTESUR NEGATIVE 05/03/2017 1547   Sepsis Labs Invalid input(s): PROCALCITONIN,  WBC,  LACTICIDVEN Microbiology Recent Results (from the past 240 hour(s))  Blood Culture (routine x 2)     Status: None (Preliminary result)   Collection Time: 05/03/17  5:00 PM  Result Value Ref Range Status   Specimen Description BLOOD LEFT ANTECUBITAL  Final   Special Requests   Final    BOTTLES DRAWN AEROBIC AND ANAEROBIC Blood Culture adequate volume   Culture NO GROWTH 4 DAYS  Final   Report Status PENDING  Incomplete  Blood Culture (routine x 2)     Status: None (Preliminary result)   Collection Time: 05/03/17  5:04 PM  Result Value Ref Range Status   Specimen Description BLOOD LEFT FOREARM  Final   Special Requests IN PEDIATRIC BOTTLE Blood Culture adequate volume  Final   Culture NO GROWTH 4 DAYS  Final   Report Status PENDING  Incomplete    Time coordinating discharge: 35 minutes  SIGNED:  Chipper Oman, MD  [facility] 05/08/2017, 2:30 PM  Pager please text page via  [email or web address] Password [email or web address]

## 2017-05-08 NOTE — Care Management Note (Addendum)
Case Management Note  Patient Details  Name: JALISSA HEINZELMAN MRN: [id number] Date of Birth: 1941/03/01  Subjective/Objective:                 Admitted with sepsis/PNA.  Action/Plan: Plan is to d/c to home today. Transportation to home will be provide by family.  Expected Discharge Date:    05/08/2017          Expected Discharge Plan:     In-House Referral:     Discharge planning Services     Post Acute Care Choice:    Choice offered to:     DME Arranged:   NEBULIZER,oxygen DME Agency:   [facility]  [facility] Arranged:    [facility] Agency:     Status of Service:   completed  If discussed at [place] of Stay Meetings, dates discussed:    Additional Comments:  Sharin Mons, RN 05/08/2017, 11:29 AM

## 2017-05-08 NOTE — Progress Notes (Signed)
ANTICOAGULATION CONSULT NOTE - Follow up [place] for Warfarin  Indication: atrial flutter  Allergies  Allergen Reactions  . Sulfonamide Derivatives Anaphylaxis and Swelling  . Tetanus Toxoids Swelling  . Other Rash    STERI - STRIPS   . Nsaids Other (See Comments)    REACTION: proteinuria    Patient Measurements: Height: 5\' 5"  (165.1 cm) Weight: 194 lb 14.4 oz (88.4 kg) IBW/kg (Calculated) : 57  Vital Signs: Temp: 97.9 F (36.6 C) (10/04 2225) Temp Source: Oral (10/04 2225) BP: 130/49 (10/04 2225) Pulse Rate: 64 (10/04 2225)  Labs:  Recent Labs  05/06/17 0658 05/07/17 0545 05/07/17 1730 05/08/17 0550  HGB 10.7*  --  11.9*  --   HCT 32.4*  --  35.9*  --   PLT 139*  --  159  --   LABPROT 54.3* 50.6*  --  50.4*  INR 6.17* 5.64*  --  5.61*  CREATININE 1.45*  --   --  1.20*    Estimated Creatinine Clearance: 45.9 mL/min (A) (by C-G formula based on SCr of 1.2 mg/dL (H)).  Assessment: 76 y/o F on warfarin PTA for atrial flutter presents to the ED with PNA, continuing warfarin. INR remains supra-therapeutic but is trending down, now at 5.61. Hgb 11.9, Plts 159.  No bleeding noted. Pt was on azithromycin PTA for a couple of days and continued here until 10/2. PO intake not documented.  PTA dose: warfarin 2.5 mg daily  Goal of Therapy:  INR 2-3 Monitor platelets by anticoagulation protocol: Yes   Plan:  Hold warfarin again today Daily PT/INR, resume warfarin as INR allows Monitor for bleeding   Thank you for allowing [place] to participate in this patients care.  Jens Som, PharmD Clinical phone for 05/08/2017 from 7a-3:30p: [phone] If after 3:30p, please call main pharmacy at: [phone] 05/08/2017 8:10 AM

## 2017-05-08 NOTE — Progress Notes (Signed)
SATURATION QUALIFICATIONS: (This note is used to comply with regulatory documentation for home oxygen)  Patient Saturations on Room Air at Rest = 83%  Patient Saturations on Room Air while Ambulating = 83%  Patient Saturations on 2 Liters of oxygen while Ambulating = 93%  Please briefly explain why patient needs home oxygen:Pt desatted to 83% on room air with ambulation. Assisted back to room and placed on 2L of O2 and oxygen sat increased to 93%-94% Pt desatted to 83% on room air with ambulation. Assisted back to room and placed on 2L of O2 and oxygen sat increased to 93%-94%.

## 2017-05-11 ENCOUNTER — Other Ambulatory Visit (INDEPENDENT_AMBULATORY_CARE_PROVIDER_SITE_OTHER): Payer: Medicare Other

## 2017-05-11 ENCOUNTER — Encounter: Payer: Self-pay | Admitting: Nurse Practitioner

## 2017-05-11 ENCOUNTER — Ambulatory Visit (INDEPENDENT_AMBULATORY_CARE_PROVIDER_SITE_OTHER): Payer: Medicare Other | Admitting: Nurse Practitioner

## 2017-05-11 VITALS — BP 152/70 | HR 67 | Temp 97.5°F | Ht 65.0 in | Wt 178.0 lb

## 2017-05-11 DIAGNOSIS — A419 Sepsis, unspecified organism: Secondary | ICD-10-CM | POA: Diagnosis not present

## 2017-05-11 DIAGNOSIS — I480 Paroxysmal atrial fibrillation: Secondary | ICD-10-CM | POA: Diagnosis not present

## 2017-05-11 DIAGNOSIS — Z794 Long term (current) use of insulin: Secondary | ICD-10-CM

## 2017-05-11 DIAGNOSIS — N183 Chronic kidney disease, stage 3 unspecified: Secondary | ICD-10-CM

## 2017-05-11 DIAGNOSIS — J181 Lobar pneumonia, unspecified organism: Secondary | ICD-10-CM

## 2017-05-11 DIAGNOSIS — J440 Chronic obstructive pulmonary disease with acute lower respiratory infection: Secondary | ICD-10-CM | POA: Diagnosis not present

## 2017-05-11 DIAGNOSIS — Z7901 Long term (current) use of anticoagulants: Secondary | ICD-10-CM

## 2017-05-11 DIAGNOSIS — J209 Acute bronchitis, unspecified: Secondary | ICD-10-CM

## 2017-05-11 DIAGNOSIS — E118 Type 2 diabetes mellitus with unspecified complications: Secondary | ICD-10-CM | POA: Diagnosis not present

## 2017-05-11 LAB — PROTIME-INR
INR: 2.3 ratio — AB (ref 0.8–1.0)
Prothrombin Time: 25 s — ABNORMAL HIGH (ref 9.6–13.1)

## 2017-05-11 LAB — CBC WITH DIFFERENTIAL/PLATELET
BASOS PCT: 0.2 % (ref 0.0–3.0)
Basophils Absolute: 0 10*3/uL (ref 0.0–0.1)
EOS ABS: 0 10*3/uL (ref 0.0–0.7)
Eosinophils Relative: 0.2 % (ref 0.0–5.0)
HCT: 35.8 % — ABNORMAL LOW (ref 36.0–46.0)
Hemoglobin: 11.6 g/dL — ABNORMAL LOW (ref 12.0–15.0)
Lymphocytes Relative: 3.7 % — ABNORMAL LOW (ref 12.0–46.0)
Lymphs Abs: 0.4 10*3/uL — ABNORMAL LOW (ref 0.7–4.0)
MCHC: 32.4 g/dL (ref 30.0–36.0)
MCV: 92.4 fl (ref 78.0–100.0)
MONO ABS: 0.7 10*3/uL (ref 0.1–1.0)
Monocytes Relative: 6.2 % (ref 3.0–12.0)
NEUTROS ABS: 10.8 10*3/uL — AB (ref 1.4–7.7)
NEUTROS PCT: 89.7 % — AB (ref 43.0–77.0)
PLATELETS: 309 10*3/uL (ref 150.0–400.0)
RBC: 3.87 Mil/uL (ref 3.87–5.11)
RDW: 15.6 % — AB (ref 11.5–15.5)
WBC: 12.1 10*3/uL — ABNORMAL HIGH (ref 4.0–10.5)

## 2017-05-11 LAB — BASIC METABOLIC PANEL
BUN: 25 mg/dL — ABNORMAL HIGH (ref 6–23)
CO2: 30 mEq/L (ref 19–32)
Calcium: 10 mg/dL (ref 8.4–10.5)
Chloride: 99 mEq/L (ref 96–112)
Creatinine, Ser: 1.05 mg/dL (ref 0.40–1.20)
GFR: 54.48 mL/min — AB (ref >60.00)
Glucose, Bld: 198 mg/dL — ABNORMAL HIGH (ref 70–99)
POTASSIUM: 4.3 meq/L (ref 3.5–5.1)
SODIUM: 135 meq/L (ref 135–145)

## 2017-05-11 MED ORDER — INSULIN LISPRO 100 UNIT/ML ~~LOC~~ SOLN: SUBCUTANEOUS | 0 refills | Status: DC

## 2017-05-11 MED ORDER — WARFARIN SODIUM 2.5 MG PO TABS: 2.5000 mg | ORAL_TABLET | Freq: Every day | ORAL | 0 refills | Status: DC

## 2017-05-11 MED ORDER — BLOOD GLUCOSE MONITOR KIT: PACK | 0 refills | Status: DC

## 2017-05-11 MED ORDER — "INSULIN SYRINGE 30G X 5/16"" 0.5 ML MISC": 1.0000 [IU] | Freq: Three times a day (TID) | 1 refills | Status: DC

## 2017-05-11 NOTE — Patient Instructions (Addendum)
Go to basement for blood draw.  Return to basement for CXR 06/01/2017.  You are to use brovana and pulmicort till 05/13/17 then switch to symbicort inhaler.  Continue to use supplement oxygen till next office visit 06/01/17.  Encourage adequate oral hydration and small frequent meal. You may also use Ensure or Glucerna drinks once or twice a day.  INR of 2.3. Resume warfarin at 2.5mg  once a day. Return to coumadin clinic in 2weeks for repeat INR.  BMP indicates improved renal function. Maintain furosemide and metoprolol dose.  Expected elevated glucose due to oral prednisone use. Sent Rx for Humalog SSI and glucometer.  Stable Hgb/Hct but mild elevation in WBC. If any fever, increased weakness or SOB; call office for sooner appt. Complete oral abx as prescribed.

## 2017-05-11 NOTE — Progress Notes (Signed)
Subjective:  Patient ID: Holly Simmons, female    DOB: 1940-09-02  Age: 76 y.o. MRN: [id number]  CC: Hospitalization Follow-up (hospital follow up--req blood recheck, medication consult--hospital change some of her med)  Accompanied by Son.  HPI Hospital follow up: Holly Simmons was hospitalized due to sepsis, LLL Pneumonia and elevated INR. She was treated with IV rocephin and azithromycin, Warfarin was held due to INR 6.17 -5.61. Due To persistent dyspnea and hypoxemia, she was discharge home with supplemental oxygen via nasal cannula. Today she reports Persistent fatigue since discharge and Decrease appetite. No nausea, no ABD pain , no constipation or diarrhea, no melena, no hematochezia, no nose bleeds. Has multiple bruises since d/c from hospital. Improved cough, but persistent SOB with exertion. Supplemental oxygen at home 2L nasal cannula. Son reports oxygen desat to 80s without oxygen while ambulating around house. Also reports Increase in BP with d/c of spironolactone and decrease metoprolol dose.  Lab and radiology reports reviewed.  medication list reviewed with patient and son.  Outpatient Medications Prior to Visit  Medication Sig Dispense Refill  . allopurinol (ZYLOPRIM) 100 MG tablet Take 1 tablet (100 mg total) by mouth daily. 90 tablet 2  . arformoterol (BROVANA) 15 MCG/2ML NEBU Take 2 mLs (15 mcg total) by nebulization 2 (two) times daily. 120 mL 0  . B Complex-C (B-COMPLEX WITH VITAMIN C) tablet Take 1 tablet by mouth daily.    . Betamethasone Valerate 0.12 % foam APPLY TO SCALP AS DIRECTED 100 g 1  . budesonide (PULMICORT) 0.25 MG/2ML nebulizer solution Take 2 mLs (0.25 mg total) by nebulization 2 (two) times daily. 60 mL 0  . buPROPion (WELLBUTRIN XL) 150 MG 24 hr tablet TAKE 1 TABLET ONCE DAILY. (Patient taking differently: TAKE 150 mg TABLET ONCE DAILY.) 90 tablet 1  . cefpodoxime (VANTIN) 200 MG tablet Take 1 tablet (200 mg total) by mouth 2 (two)  times daily. 10 tablet 0  . Cholecalciferol 2000 UNITS TABS Take 1 tablet (2,000 Units total) by mouth daily. 90 tablet 3  . ciclopirox (LOPROX) 0.77 % SUSP Apply 1 Act topically 2 (two) times daily. (Patient taking differently: Apply 1 Act topically as needed. ) 60 mL 2  . fexofenadine (ALLEGRA) 180 MG tablet Take 180 mg by mouth daily as needed for allergies.     . furosemide (LASIX) 40 MG tablet Take 0.5 tablets (20 mg total) by mouth daily. 30 tablet 0  . HYDROcodone-homatropine (HYCODAN) 5-1.5 MG/5ML syrup Take 5 mLs by mouth every 6 (six) hours as needed for cough. 120 mL 0  . ipratropium (ATROVENT) 0.03 % nasal spray Place 2 sprays into both nostrils 3 (three) times daily as needed for rhinitis. 30 mL 2  . lamoTRIgine (LAMICTAL) 25 MG tablet TAKE 1 TABLET ONCE DAILY. (Patient taking differently: TAKE 25 mg in the evening) 90 tablet 1  . linagliptin (TRADJENTA) 5 MG TABS tablet Take 1 tablet (5 mg total) by mouth daily. 90 tablet 2  . metoprolol tartrate (LOPRESSOR) 25 MG tablet Take 1 tablet (25 mg total) by mouth 2 (two) times daily. 60 tablet 0  . modafinil (PROVIGIL) 100 MG tablet Take 1 tablet (100 mg total) by mouth daily. (Patient taking differently: Take 100 mg by mouth as needed. ) 30 tablet 5  . Multiple Vitamin (MULTIVITAMIN) capsule Take 1 capsule by mouth daily.    . predniSONE (DELTASONE) 10 MG tablet Take 4 tablets for 3 days; Take 3 tablets for 4 days; Take 2 tablets for  3 days; Take 1 tablet for 4 days 34 tablet 0  . PRESCRIPTION MEDICATION CPAP    . ranitidine (ZANTAC) 300 MG tablet TAKE 1 TABLET (300 MG TOTAL) BY MOUTH AT BEDTIME. 90 tablet 2  . triamcinolone cream (KENALOG) 0.5 % Apply 1 application topically 3 (three) times daily as needed (itching).     Marland Kitchen VIIBRYD 40 MG TABS TAKE 1 TABLET (40 MG TOTAL) BY MOUTH DAILY. 90 tablet 2   No facility-administered medications prior to visit.     ROS See HPI  Objective:  BP (!) 152/70   Pulse 67   Temp (!) 97.5 F (36.4  C)   Ht '5\' 5"'  (1.651 m)   Wt 178 lb (80.7 kg)   SpO2 97%   BMI 29.62 kg/m   BP Readings from Last 3 Encounters:  05/11/17 (!) 152/70  05/08/17 (!) 142/52  03/19/17 134/78    Wt Readings from Last 3 Encounters:  05/11/17 178 lb (80.7 kg)  05/08/17 194 lb 14.4 oz (88.4 kg)  03/19/17 178 lb 4 oz (80.9 kg)    Physical Exam  Constitutional: She is oriented to person, place, and time. No distress.  Cardiovascular: Normal rate.   Murmur heard. Irregular heart sounds  Pulmonary/Chest: Effort normal. She has rales.  LLL rales. Diminished in bilateral lower lobes  Abdominal: Soft. Bowel sounds are normal. She exhibits no distension. There is no tenderness.  Musculoskeletal: She exhibits no edema.  Neurological: She is alert and oriented to person, place, and time.  Skin: Skin is warm, dry and intact. Ecchymosis noted.  Vitals reviewed.   Lab Results  Component Value Date   WBC 12.1 (H) 05/11/2017   HGB 11.6 (L) 05/11/2017   HCT 35.8 (L) 05/11/2017   PLT 309.0 05/11/2017   GLUCOSE 198 (H) 05/11/2017   CHOL 139 09/17/2016   TRIG 77.0 09/17/2016   HDL 53.80 09/17/2016   LDLCALC 70 09/17/2016   ALT 14 05/04/2017   AST 35 05/04/2017   NA 135 05/11/2017   K 4.3 05/11/2017   CL 99 05/11/2017   CREATININE 1.05 05/11/2017   BUN 25 (H) 05/11/2017   CO2 30 05/11/2017   TSH 2.92 09/17/2016   INR 2.3 (H) 05/11/2017   HGBA1C 6.3 03/18/2017   MICROALBUR 22.8 (H) 06/04/2016    Dg Chest Portable 1 View  Result Date: 05/03/2017 CLINICAL DATA:  Cough for the past week. Fever. Myalgias. Some shortness of breath. EXAM: PORTABLE CHEST 1 VIEW COMPARISON:  07/14/2012. FINDINGS: Mildly progressive enlargement of the cardiac silhouette. Stable post CABG changes, left subclavian pacemaker lead and epicardial leads. Interval patchy opacity in the lateral aspect of the left mid and lower lung zone. Clear right lung. Unremarkable bones. IMPRESSION: 1. Probable pneumonia in the left mid and  lower lung zones. Followup PA and lateral chest X-ray is recommended in 3-4 weeks following trial of antibiotic therapy to ensure resolution and exclude underlying malignancy. 2. Mildly progressive cardiomegaly. Electronically Signed   By: Claudie Revering M.D.   On: 05/03/2017 16:40    Assessment & Plan:   Dea was seen today for hospitalization follow-up.  Diagnoses and all orders for this visit:  Lobar pneumonia ([place]) -     CBC w/Diff; Future -     DG Chest 2 View; Future  Paroxysmal atrial fibrillation (HCC) -     warfarin (COUMADIN) 2.5 MG tablet; Take 1 tablet (2.5 mg total) by mouth daily at 6 PM.  Current use of long term anticoagulation -  INR/PT; Future -     Basic metabolic panel; Future -     warfarin (COUMADIN) 2.5 MG tablet; Take 1 tablet (2.5 mg total) by mouth daily at 6 PM.  Stage 3 chronic kidney disease ([place]) -     Basic metabolic panel; Future  COPD (chronic obstructive pulmonary disease) with acute bronchitis (HCC)  Type 2 diabetes mellitus with complication, with long-term current use of insulin (HCC) -     Discontinue: insulin lispro (HUMALOG) 100 UNIT/ML injection; Insulin (Novolog) sliding scale for additional Novolog: administer 17mnutes before meals. Sugar <200- no additional units Sugar >200-250 - 6 additional units Sugar >251-300 - 8 additional units Sugar <351-350 - 10 additional units and call office. Sugar >400 - call office. -     blood glucose meter kit and supplies KIT; Dispense based on patient and insurance preference. Use three times a day (before meals). E11.8 -     insulin lispro (HUMALOG) 100 UNIT/ML injection; Insulin (Novolog) sliding scale for additional Novolog: administer 5422mutes before meals. Sugar <200- no additional units Sugar >200-250 - 6 additional units Sugar >251-300 - 8 additional units Sugar <351-350 - 10 additional units and call office. Sugar >400 - call office. -     Insulin Syringe-Needle U-100 (INSULIN SYRINGE  .5CC/30GX5/16") 30G X 5/16" 0.5 ML MISC; 1 Units by Does not apply route 3 (three) times daily with meals.  Sepsis, due to unspecified organism ([facility]  I am having Ms. Khanna start on warfarin, blood glucose meter kit and supplies, and INSULIN SYRINGE .5CC/30GX5/16". I am also having her maintain her fexofenadine, B-complex with vitamin C, triamcinolone cream, Cholecalciferol, Betamethasone Valerate, PRESCRIPTION MEDICATION, VIIBRYD, ranitidine, allopurinol, linagliptin, ciclopirox, ipratropium, modafinil, lamoTRIgine, buPROPion, multivitamin, furosemide, arformoterol, budesonide, HYDROcodone-homatropine, metoprolol tartrate, cefpodoxime, predniSONE, and insulin lispro.  Meds ordered this encounter  Medications  . warfarin (COUMADIN) 2.5 MG tablet    Sig: Take 1 tablet (2.5 mg total) by mouth daily at 6 PM.    Dispense:  30 tablet    Refill:  0    Order Specific Question:   Supervising Provider    Answer:   PLCassandria [id number]]  . DISCONTD: insulin lispro (HUMALOG) 100 UNIT/ML injection    Sig: Insulin (Novolog) sliding scale for additional Novolog: administer 22m92mtes before meals. Sugar <200- no additional units Sugar >200-250 - 6 additional units Sugar >251-300 - 8 additional units Sugar <351-350 - 10 additional units and call office. Sugar >400 - call office.    Dispense:  10 mL    Refill:  0    Order Specific Question:   Supervising Provider    Answer:   PLOCassandria [id number]]  . blood glucose meter kit and supplies KIT    Sig: Dispense based on patient and insurance preference. Use three times a day (before meals). E11.8    Dispense:  1 each    Refill:  0    Order Specific Question:   Supervising Provider    Answer:   PLOCassandria [id number]]    Order Specific Question:   Number of strips    Answer:   100    Order Specific Question:   Number of lancets    Answer:   100  . insulin lispro (HUMALOG) 100 UNIT/ML injection    Sig: Insulin (Novolog) sliding  scale for additional Novolog: administer 22mi17mes before meals. Sugar <200- no additional units Sugar >200-250 - 6 additional units Sugar >251-300 - 8 additional units Sugar <351-350 - 10 additional units  and call office. Sugar >400 - call office.    Dispense:  10 mL    Refill:  0  . Insulin Syringe-Needle U-100 (INSULIN SYRINGE .5CC/30GX5/16") 30G X 5/16" 0.5 ML MISC    Sig: 1 Units by Does not apply route 3 (three) times daily with meals.    Dispense:  100 each    Refill:  1    Order Specific Question:   Supervising Provider    Answer:   Cassandria Anger [[id number]]    Follow-up: Return in about 3 weeks (around 06/01/2017) for pneumonia.  Wilfred Lacy, NP

## 2017-05-13 ENCOUNTER — Ambulatory Visit: Payer: Self-pay

## 2017-05-20 ENCOUNTER — Other Ambulatory Visit: Payer: Self-pay | Admitting: Internal Medicine

## 2017-05-20 DIAGNOSIS — B370 Candidal stomatitis: Secondary | ICD-10-CM | POA: Insufficient documentation

## 2017-05-20 MED ORDER — FLUCONAZOLE 100 MG PO TABS: 100.0000 mg | ORAL_TABLET | Freq: Every day | ORAL | 0 refills | Status: AC

## 2017-05-22 ENCOUNTER — Ambulatory Visit (INDEPENDENT_AMBULATORY_CARE_PROVIDER_SITE_OTHER): Payer: Medicare Other | Admitting: General Practice

## 2017-05-22 DIAGNOSIS — Z7901 Long term (current) use of anticoagulants: Secondary | ICD-10-CM

## 2017-05-22 DIAGNOSIS — Z5181 Encounter for therapeutic drug level monitoring: Secondary | ICD-10-CM

## 2017-05-22 LAB — POCT INR: INR: 3.1

## 2017-05-22 NOTE — Patient Instructions (Signed)
Pre visit review using our clinic review tool, if applicable. No additional management support is needed unless otherwise documented below in the visit note. 

## 2017-06-01 ENCOUNTER — Encounter: Payer: Self-pay | Admitting: Internal Medicine

## 2017-06-01 ENCOUNTER — Other Ambulatory Visit (INDEPENDENT_AMBULATORY_CARE_PROVIDER_SITE_OTHER): Payer: Self-pay

## 2017-06-01 ENCOUNTER — Ambulatory Visit (INDEPENDENT_AMBULATORY_CARE_PROVIDER_SITE_OTHER)
Admission: RE | Admit: 2017-06-01 | Discharge: 2017-06-01 | Disposition: A | Discharge status: Home / Self Care | Payer: Medicare Other | Source: Ambulatory Visit | Attending: Nurse Practitioner | Admitting: Nurse Practitioner

## 2017-06-01 ENCOUNTER — Other Ambulatory Visit: Payer: Self-pay | Admitting: Internal Medicine

## 2017-06-01 ENCOUNTER — Ambulatory Visit (INDEPENDENT_AMBULATORY_CARE_PROVIDER_SITE_OTHER): Payer: Medicare Other | Admitting: Internal Medicine

## 2017-06-01 VITALS — BP 128/58 | HR 72 | Temp 98.4°F | Resp 16 | Ht 65.0 in | Wt 167.0 lb

## 2017-06-01 DIAGNOSIS — I480 Paroxysmal atrial fibrillation: Secondary | ICD-10-CM

## 2017-06-01 DIAGNOSIS — Z7901 Long term (current) use of anticoagulants: Secondary | ICD-10-CM

## 2017-06-01 DIAGNOSIS — J181 Lobar pneumonia, unspecified organism: Secondary | ICD-10-CM | POA: Diagnosis not present

## 2017-06-01 DIAGNOSIS — Z5181 Encounter for therapeutic drug level monitoring: Secondary | ICD-10-CM

## 2017-06-01 DIAGNOSIS — E669 Obesity, unspecified: Secondary | ICD-10-CM | POA: Diagnosis not present

## 2017-06-01 DIAGNOSIS — E66811 Obesity, class 1: Secondary | ICD-10-CM

## 2017-06-01 DIAGNOSIS — D539 Nutritional anemia, unspecified: Secondary | ICD-10-CM

## 2017-06-01 LAB — CBC WITH DIFFERENTIAL/PLATELET
Basophils Absolute: 0 10*3/uL (ref 0.0–0.1)
Basophils Relative: 0.7 % (ref 0.0–3.0)
EOS PCT: 4.2 % (ref 0.0–5.0)
Eosinophils Absolute: 0.3 10*3/uL (ref 0.0–0.7)
HCT: 37.4 % (ref 36.0–46.0)
Hemoglobin: 12.1 g/dL (ref 12.0–15.0)
LYMPHS ABS: 1 10*3/uL (ref 0.7–4.0)
Lymphocytes Relative: 14.5 % (ref 12.0–46.0)
MCHC: 32.4 g/dL (ref 30.0–36.0)
MCV: 93.6 fl (ref 78.0–100.0)
MONOS PCT: 11.4 % (ref 3.0–12.0)
Monocytes Absolute: 0.8 10*3/uL (ref 0.1–1.0)
NEUTROS ABS: 4.6 10*3/uL (ref 1.4–7.7)
NEUTROS PCT: 69.2 % (ref 43.0–77.0)
PLATELETS: 212 10*3/uL (ref 150.0–400.0)
RBC: 3.99 Mil/uL (ref 3.87–5.11)
RDW: 16.8 % — AB (ref 11.5–15.5)
WBC: 6.7 10*3/uL (ref 4.0–10.5)

## 2017-06-01 LAB — PROTIME-INR
INR: 5.7 ratio (ref 0.8–1.0)
Prothrombin Time: 60.2 s (ref 9.6–13.1)

## 2017-06-01 MED ORDER — WARFARIN SODIUM 1 MG PO TABS: 1.0000 mg | ORAL_TABLET | Freq: Every day | ORAL | 0 refills | Status: DC

## 2017-06-01 NOTE — Progress Notes (Signed)
Subjective:  Patient ID: Holly Simmons, female    DOB: 1941-04-12  Age: 76 y.o. MRN: [id number]  CC: Anemia and Atrial Fibrillation   HPI Holly Simmons presents for f/up -she was recently admitted for treatment of pneumonia.  She tells me her cough has resolved and she has had no recent episodes of fever, chills, night sweats, or wheezing.  She had to have several medications adjusted during her admission.  The last few days she has had a couple of nosebleeds and complains that there is an ecchymosis over her right lower quadrant where she got a Lovenox injection in the hospital.  Outpatient Medications Prior to Visit  Medication Sig Dispense Refill  . allopurinol (ZYLOPRIM) 100 MG tablet Take 1 tablet (100 mg total) by mouth daily. 90 tablet 2  . arformoterol (BROVANA) 15 MCG/2ML NEBU Take 2 mLs (15 mcg total) by nebulization 2 (two) times daily. 120 mL 0  . B Complex-C (B-COMPLEX WITH VITAMIN C) tablet Take 1 tablet by mouth daily.    . Betamethasone Valerate 0.12 % foam APPLY TO SCALP AS DIRECTED 100 g 1  . blood glucose meter kit and supplies KIT Dispense based on patient and insurance preference. Use three times a day (before meals). E11.8 1 each 0  . budesonide (PULMICORT) 0.25 MG/2ML nebulizer solution Take 2 mLs (0.25 mg total) by nebulization 2 (two) times daily. 60 mL 0  . buPROPion (WELLBUTRIN XL) 150 MG 24 hr tablet TAKE 1 TABLET ONCE DAILY. (Patient taking differently: TAKE 150 mg TABLET ONCE DAILY.) 90 tablet 1  . Cholecalciferol 2000 UNITS TABS Take 1 tablet (2,000 Units total) by mouth daily. 90 tablet 3  . ciclopirox (LOPROX) 0.77 % SUSP Apply 1 Act topically 2 (two) times daily. (Patient taking differently: Apply 1 Act topically as needed. ) 60 mL 2  . fexofenadine (ALLEGRA) 180 MG tablet Take 180 mg by mouth daily as needed for allergies.     . furosemide (LASIX) 40 MG tablet Take 0.5 tablets (20 mg total) by mouth daily. 30 tablet 0  . insulin lispro  (HUMALOG) 100 UNIT/ML injection Insulin (Novolog) sliding scale for additional Novolog: administer 67mnutes before meals. Sugar <200- no additional units Sugar >200-250 - 6 additional units Sugar >251-300 - 8 additional units Sugar <351-350 - 10 additional units and call office. Sugar >400 - call office. 10 mL 0  . Insulin Syringe-Needle U-100 (INSULIN SYRINGE .5CC/30GX5/16") 30G X 5/16" 0.5 ML MISC 1 Units by Does not apply route 3 (three) times daily with meals. 100 each 1  . ipratropium (ATROVENT) 0.03 % nasal spray Place 2 sprays into both nostrils 3 (three) times daily as needed for rhinitis. 30 mL 2  . lamoTRIgine (LAMICTAL) 25 MG tablet TAKE 1 TABLET ONCE DAILY. (Patient taking differently: TAKE 25 mg in the evening) 90 tablet 1  . linagliptin (TRADJENTA) 5 MG TABS tablet Take 1 tablet (5 mg total) by mouth daily. 90 tablet 2  . Multiple Vitamin (MULTIVITAMIN) capsule Take 1 capsule by mouth daily.    .Marland KitchenPRESCRIPTION MEDICATION CPAP    . ranitidine (ZANTAC) 300 MG tablet TAKE 1 TABLET (300 MG TOTAL) BY MOUTH AT BEDTIME. 90 tablet 2  . VIIBRYD 40 MG TABS TAKE 1 TABLET (40 MG TOTAL) BY MOUTH DAILY. 90 tablet 2  . metoprolol tartrate (LOPRESSOR) 25 MG tablet Take 1 tablet (25 mg total) by mouth 2 (two) times daily. 60 tablet 0  . modafinil (PROVIGIL) 100 MG tablet Take 1 tablet (  100 mg total) by mouth daily. (Patient taking differently: Take 100 mg by mouth as needed. ) 30 tablet 5  . predniSONE (DELTASONE) 10 MG tablet Take 4 tablets for 3 days; Take 3 tablets for 4 days; Take 2 tablets for 3 days; Take 1 tablet for 4 days 34 tablet 0  . triamcinolone cream (KENALOG) 0.5 % Apply 1 application topically 3 (three) times daily as needed (itching).     . warfarin (COUMADIN) 2.5 MG tablet Take 1 tablet (2.5 mg total) by mouth daily at 6 PM. 30 tablet 0  . HYDROcodone-homatropine (HYCODAN) 5-1.5 MG/5ML syrup Take 5 mLs by mouth every 6 (six) hours as needed for cough. 120 mL 0   No  facility-administered medications prior to visit.     ROS Review of Systems  Constitutional: Negative.  Negative for chills, diaphoresis, fatigue and fever.  HENT: Positive for nosebleeds. Negative for congestion and sore throat.   Eyes: Negative.   Respiratory: Negative for cough, chest tightness, shortness of breath and wheezing.   Cardiovascular: Negative for chest pain, palpitations and leg swelling.  Gastrointestinal: Negative for abdominal pain, blood in stool, constipation, diarrhea, nausea and vomiting.  Endocrine: Negative.   Genitourinary: Negative.  Negative for decreased urine volume, difficulty urinating, hematuria and urgency.  Musculoskeletal: Negative.   Skin: Negative.   Allergic/Immunologic: Negative.   Neurological: Negative.  Negative for dizziness.  Hematological: Negative for adenopathy. Bruises/bleeds easily.  Psychiatric/Behavioral: Negative.     Objective:  BP (!) 128/58 (BP Location: Left Arm, Patient Position: Sitting, Cuff Size: Normal)   Pulse 72   Temp 98.4 F (36.9 C) (Oral)   Resp 16   Ht '5\' 5"'  (1.651 m)   Wt 167 lb (75.8 kg)   SpO2 96%   BMI 27.79 kg/m   BP Readings from Last 3 Encounters:  06/01/17 (!) 128/58  05/11/17 (!) 152/70  05/08/17 (!) 142/52    Wt Readings from Last 3 Encounters:  06/01/17 167 lb (75.8 kg)  05/11/17 178 lb (80.7 kg)  05/08/17 194 lb 14.4 oz (88.4 kg)    Physical Exam  Constitutional: She is oriented to person, place, and time. No distress.  HENT:  Nose: No mucosal edema or rhinorrhea. No epistaxis.  Mouth/Throat: Oropharynx is clear and moist. No oropharyngeal exudate.  Eyes: Conjunctivae are normal. Right eye exhibits no discharge. Left eye exhibits no discharge. No scleral icterus.  Neck: Normal range of motion. Neck supple. No JVD present. No thyromegaly present.  Cardiovascular: Normal rate, regular rhythm and intact distal pulses.  Exam reveals no gallop.   No murmur heard. Pulmonary/Chest: Effort  normal and breath sounds normal. No respiratory distress. She has no wheezes. She has no rales.  Abdominal: Soft. Bowel sounds are normal. She exhibits no distension and no mass. There is no rebound and no guarding.  Musculoskeletal: She exhibits no edema or tenderness.  Lymphadenopathy:    She has no cervical adenopathy.  Neurological: She is alert and oriented to person, place, and time.  Skin: Skin is warm and dry. No rash noted. She is not diaphoretic. No erythema. No pallor.     Vitals reviewed.   Lab Results  Component Value Date   WBC 6.7 06/01/2017   HGB 12.1 06/01/2017   HCT 37.4 06/01/2017   PLT 212.0 06/01/2017   GLUCOSE 198 (H) 05/11/2017   CHOL 139 09/17/2016   TRIG 77.0 09/17/2016   HDL 53.80 09/17/2016   LDLCALC 70 09/17/2016   ALT 14 05/04/2017  AST 35 05/04/2017   NA 135 05/11/2017   K 4.3 05/11/2017   CL 99 05/11/2017   CREATININE 1.05 05/11/2017   BUN 25 (H) 05/11/2017   CO2 30 05/11/2017   TSH 2.92 09/17/2016   INR 5.7 (HH) 06/01/2017   HGBA1C 6.3 03/18/2017   MICROALBUR 22.8 (H) 06/04/2016    Dg Chest 2 View  Result Date: 06/01/2017 CLINICAL DATA:  Follow-up of pneumonia. No current complaints. History of COPD, valvular heart disease, previous MI. EXAM: CHEST  2 VIEW COMPARISON:  Portable chest x-ray of May 03, 2017 FINDINGS: Infiltrate on the left is nearly totally cleared. The right lung is clear. Both lungs exhibit emphysematous changes. The cardiac silhouette remains enlarged. The pulmonary vascularity is normal. The sternal wires are intact. The ICD is in stable position. There are degenerative changes of both shoulders. IMPRESSION: COPD. Stable cardiomegaly. No CHF. Near total clearing of infiltrate in the superior aspect of the left lower lobe. Electronically Signed   By: David  Martinique M.D.   On: 06/01/2017 10:01    Assessment & Plan:   Gurneet was seen today for anemia and atrial fibrillation.  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 -     Protime-INR; Future -     warfarin (COUMADIN) 1 MG tablet; Take 1 tablet (1 mg total) by mouth daily.  Encounter for therapeutic drug monitoring- her INR is up to 5.7 and she is symptomatic.  I have asked her to hold the Coumadin for 2 days.  Her current dose is 2.5 mg a day.  I have asked her to restart after 2 days at 1 mg a day.  I have asked to return to the Coumadin clinic in 1-2 weeks to recheck her INR. -     Protime-INR; Future   I have discontinued Ms. Hafner's triamcinolone cream, modafinil, HYDROcodone-homatropine, predniSONE, and warfarin. I am also having her start on warfarin. Additionally, I am having her maintain her fexofenadine, B-complex with vitamin C, Cholecalciferol, Betamethasone Valerate, PRESCRIPTION MEDICATION, VIIBRYD, ranitidine, allopurinol, linagliptin, ciclopirox, ipratropium, lamoTRIgine, buPROPion, multivitamin, furosemide, arformoterol, budesonide, blood glucose meter kit and supplies, insulin lispro, and INSULIN SYRINGE .5CC/30GX5/16".  Meds ordered this encounter  Medications  . warfarin (COUMADIN) 1 MG tablet    Sig: Take 1 tablet (1 mg total) by mouth daily.    Dispense:  30 tablet    Refill:  0     Follow-up: Return in about 4 months (around 10/01/2017).  Scarlette Calico, MD

## 2017-06-01 NOTE — Patient Instructions (Signed)
Anemia, Nonspecific Anemia is a condition in which the concentration of red blood cells or hemoglobin in the blood is below normal. Hemoglobin is a substance in red blood cells that carries oxygen to the tissues of the body. Anemia results in not enough oxygen reaching these tissues. What are the causes? Common causes of anemia include:  Excessive bleeding. Bleeding may be internal or external. This includes excessive bleeding from periods (in women) or from the intestine.  Poor nutrition.  Chronic kidney, thyroid, and liver disease.  Bone marrow disorders that decrease red blood cell production.  Cancer and treatments for cancer.  HIV, AIDS, and their treatments.  Spleen problems that increase red blood cell destruction.  Blood disorders.  Excess destruction of red blood cells due to infection, medicines, and autoimmune disorders. What are the signs or symptoms?  Minor weakness.  Dizziness.  Headache.  Palpitations.  Shortness of breath, especially with exercise.  Paleness.  Cold sensitivity.  Indigestion.  Nausea.  Difficulty sleeping.  Difficulty concentrating. Symptoms may occur suddenly or they may develop slowly. How is this diagnosed? Additional blood tests are often needed. These help your health care provider determine the best treatment. Your health care provider will check your stool for blood and look for other causes of blood loss. How is this treated? Treatment varies depending on the cause of the anemia. Treatment can include:  Supplements of iron, vitamin B12, or folic acid.  Hormone medicines.  A blood transfusion. This may be needed if blood loss is severe.  Hospitalization. This may be needed if there is significant continual blood loss.  Dietary changes.  Spleen removal. Follow these instructions at home: Keep all follow-up appointments. It often takes many weeks to correct anemia, and having your health care provider check on your  condition and your response to treatment is very important. Get help right away if:  You develop extreme weakness, shortness of breath, or chest pain.  You become dizzy or have trouble concentrating.  You develop heavy vaginal bleeding.  You develop a rash.  You have bloody or black, tarry stools.  You faint.  You vomit up blood.  You vomit repeatedly.  You have abdominal pain.  You have a fever or persistent symptoms for more than 2-3 days.  You have a fever and your symptoms suddenly get worse.  You are dehydrated. This information is not intended to replace advice given to you by your health care provider. Make sure you discuss any questions you have with your health care provider. Document Released: 08/28/2004 Document Revised: 01/02/2016 Document Reviewed: 01/14/2013 Elsevier Interactive Patient Education  2017 [employer]  

## 2017-06-02 ENCOUNTER — Encounter: Payer: Self-pay | Admitting: Internal Medicine

## 2017-06-02 DIAGNOSIS — J181 Lobar pneumonia, unspecified organism: Secondary | ICD-10-CM

## 2017-06-10 ENCOUNTER — Ambulatory Visit (INDEPENDENT_AMBULATORY_CARE_PROVIDER_SITE_OTHER): Payer: Medicare Other | Admitting: General Practice

## 2017-06-10 DIAGNOSIS — I4892 Unspecified atrial flutter: Secondary | ICD-10-CM

## 2017-06-10 DIAGNOSIS — Z7901 Long term (current) use of anticoagulants: Secondary | ICD-10-CM

## 2017-06-10 LAB — POCT INR: INR: 1.4

## 2017-06-15 NOTE — Addendum Note (Signed)
Addended by: Meriam Sprague D on: 06/15/2017 01:53 PM   Modules accepted: Level of Service

## 2017-06-17 ENCOUNTER — Other Ambulatory Visit: Payer: Self-pay | Admitting: General Practice

## 2017-06-17 ENCOUNTER — Ambulatory Visit (INDEPENDENT_AMBULATORY_CARE_PROVIDER_SITE_OTHER): Payer: Medicare Other | Admitting: General Practice

## 2017-06-17 DIAGNOSIS — I480 Paroxysmal atrial fibrillation: Secondary | ICD-10-CM

## 2017-06-17 DIAGNOSIS — Z7901 Long term (current) use of anticoagulants: Secondary | ICD-10-CM | POA: Diagnosis not present

## 2017-06-17 DIAGNOSIS — I4892 Unspecified atrial flutter: Secondary | ICD-10-CM | POA: Diagnosis not present

## 2017-06-17 LAB — POCT INR: INR: 1.6

## 2017-06-17 MED ORDER — WARFARIN SODIUM 1 MG PO TABS: 1.0000 mg | ORAL_TABLET | Freq: Every day | ORAL | 0 refills | Status: DC

## 2017-06-17 NOTE — Patient Instructions (Addendum)
Pre visit review using our clinic review tool, if applicable. No additional management support is needed unless otherwise documented below in the visit note.  Take 2.5 mg daily except 2 mg on Monday/Fridays.  Re-check in 2 weeks.

## 2017-06-18 ENCOUNTER — Other Ambulatory Visit: Payer: Self-pay | Admitting: Internal Medicine

## 2017-06-18 DIAGNOSIS — I480 Paroxysmal atrial fibrillation: Secondary | ICD-10-CM

## 2017-06-18 MED ORDER — WARFARIN SODIUM 1 MG PO TABS: 2.0000 mg | ORAL_TABLET | Freq: Every day | ORAL | 3 refills | Status: DC

## 2017-06-30 ENCOUNTER — Other Ambulatory Visit: Payer: Self-pay | Admitting: Internal Medicine

## 2017-06-30 ENCOUNTER — Ambulatory Visit (INDEPENDENT_AMBULATORY_CARE_PROVIDER_SITE_OTHER): Payer: Medicare Other | Admitting: *Deleted

## 2017-06-30 DIAGNOSIS — I442 Atrioventricular block, complete: Secondary | ICD-10-CM | POA: Diagnosis not present

## 2017-07-01 ENCOUNTER — Ambulatory Visit (INDEPENDENT_AMBULATORY_CARE_PROVIDER_SITE_OTHER): Payer: Medicare Other | Admitting: General Practice

## 2017-07-01 DIAGNOSIS — Z7901 Long term (current) use of anticoagulants: Secondary | ICD-10-CM

## 2017-07-01 DIAGNOSIS — I4892 Unspecified atrial flutter: Secondary | ICD-10-CM

## 2017-07-01 LAB — POCT INR: INR: 1.6

## 2017-07-01 NOTE — Patient Instructions (Addendum)
Pre visit review using our clinic review tool, if applicable. No additional management support is needed unless otherwise documented below in the visit note.  Take 5 mg today (11/28) and then take 2.5 mg daily. Re-check in 2 weeks.

## 2017-07-01 NOTE — Progress Notes (Signed)
Remote pacemaker transmission.   

## 2017-07-03 ENCOUNTER — Encounter: Payer: Self-pay | Admitting: Cardiology

## 2017-07-03 LAB — CUP PACEART REMOTE DEVICE CHECK
Battery Impedance: 307 Ohm
Battery Voltage: 2.79 V
Brady Statistic AS VS Percent: 0 %
Date Time Interrogation Session: 20181127150830
Implantable Lead Implant Date: 20131210
Implantable Lead Location: 753858
Implantable Lead Location: 753859
Implantable Lead Model: 4296
Lead Channel Pacing Threshold Amplitude: 0.75 V
Lead Channel Pacing Threshold Pulse Width: 0.4 ms
Lead Channel Setting Pacing Amplitude: 2.5 V
Lead Channel Setting Pacing Pulse Width: 0.4 ms
Lead Channel Setting Sensing Sensitivity: 8 mV
MDC IDC LEAD IMPLANT DT: 20131210
MDC IDC MSMT BATTERY REMAINING LONGEVITY: 101 mo
MDC IDC MSMT LEADCHNL RA IMPEDANCE VALUE: 461 Ohm
MDC IDC MSMT LEADCHNL RA PACING THRESHOLD AMPLITUDE: 0.875 V
MDC IDC MSMT LEADCHNL RA PACING THRESHOLD PULSEWIDTH: 0.4 ms
MDC IDC MSMT LEADCHNL RV IMPEDANCE VALUE: 872 Ohm
MDC IDC PG IMPLANT DT: 20131210
MDC IDC SET LEADCHNL RA PACING AMPLITUDE: 2 V
MDC IDC STAT BRADY AP VP PERCENT: 37 %
MDC IDC STAT BRADY AP VS PERCENT: 0 %
MDC IDC STAT BRADY AS VP PERCENT: 63 %

## 2017-07-14 ENCOUNTER — Ambulatory Visit: Payer: Self-pay

## 2017-07-15 ENCOUNTER — Ambulatory Visit: Payer: Self-pay

## 2017-07-17 ENCOUNTER — Ambulatory Visit (INDEPENDENT_AMBULATORY_CARE_PROVIDER_SITE_OTHER): Payer: Medicare Other | Admitting: General Practice

## 2017-07-17 DIAGNOSIS — Z7901 Long term (current) use of anticoagulants: Secondary | ICD-10-CM

## 2017-07-17 LAB — POCT INR: INR: 2.1

## 2017-07-17 NOTE — Patient Instructions (Signed)
Pre visit review using our clinic review tool, if applicable. No additional management support is needed unless otherwise documented below in the visit note.  Continue to take 2.5 mg daily. Re-check in 4 weeks.  

## 2017-07-21 ENCOUNTER — Ambulatory Visit (INDEPENDENT_AMBULATORY_CARE_PROVIDER_SITE_OTHER): Payer: Medicare Other

## 2017-07-21 DIAGNOSIS — M80071S Age-related osteoporosis with current pathological fracture, right ankle and foot, sequela: Secondary | ICD-10-CM

## 2017-07-21 MED ORDER — DENOSUMAB 60 MG/ML ~~LOC~~ SOLN
60.0000 mg | Freq: Once | SUBCUTANEOUS | Status: AC
  Administered 2017-07-21: 60 mg via SUBCUTANEOUS

## 2017-07-30 ENCOUNTER — Other Ambulatory Visit: Payer: Self-pay | Admitting: Internal Medicine

## 2017-08-06 ENCOUNTER — Other Ambulatory Visit: Payer: Self-pay | Admitting: Pulmonary Disease

## 2017-08-06 NOTE — Telephone Encounter (Signed)
Left message for patient regarding medication refill on modafinil. Patient is needing to return call.  X1  The medication modafinil was discontinued in error, and need to see if patient has enough to last until 08/10/2017 when VS back in office.

## 2017-08-07 NOTE — Telephone Encounter (Signed)
Pt is calling back [phone]

## 2017-08-07 NOTE — Telephone Encounter (Signed)
Spoke with pt. She is needing a refill on Modafinil. Advised her that we have to have VS's approval on this before we can refill it. She is aware that VS is not back until Monday. States that she has plenty of medication left to last her until his return. Pt also wanted to know if this medication could affect her kidneys.  VS - please advise. Thanks.

## 2017-08-09 NOTE — Telephone Encounter (Signed)
Okay to send refill.  Please let her know that use of modafinil shouldn't cause significant kidney related side effects.

## 2017-08-10 ENCOUNTER — Telehealth: Payer: Self-pay | Admitting: Pulmonary Disease

## 2017-08-10 NOTE — Telephone Encounter (Signed)
lmtcb for pt.  Will call in refill after speaking to pt.

## 2017-08-10 NOTE — Telephone Encounter (Signed)
Received fax from [facility] for patients prescription of Modafinil. Patient no longer had authorized refills on file. Spoke with Dr. Halford Chessman, patients prescription was denied back on 04/16/17. VS agreed to signing for patient to receive refills but to advise patient that they may not be covered by insurance. Will fax paper back to pharmacy and contact patient to let her know.

## 2017-08-11 ENCOUNTER — Ambulatory Visit: Payer: Self-pay

## 2017-08-11 NOTE — Telephone Encounter (Signed)
Attempted to contact pt. No answer, no option to leave a message. Will try back.  

## 2017-08-12 MED ORDER — MODAFINIL 100 MG PO TABS: 100.0000 mg | ORAL_TABLET | Freq: Every day | ORAL | 5 refills | Status: DC

## 2017-08-12 NOTE — Telephone Encounter (Signed)
I have left a detailed message on pt's named voicemail. Rx has been called in. Nothing further was needed.

## 2017-08-18 ENCOUNTER — Other Ambulatory Visit: Payer: Self-pay | Admitting: General Practice

## 2017-08-18 ENCOUNTER — Ambulatory Visit (INDEPENDENT_AMBULATORY_CARE_PROVIDER_SITE_OTHER): Payer: Medicare Other | Admitting: General Practice

## 2017-08-18 DIAGNOSIS — I422 Other hypertrophic cardiomyopathy: Secondary | ICD-10-CM

## 2017-08-18 DIAGNOSIS — Z7901 Long term (current) use of anticoagulants: Secondary | ICD-10-CM

## 2017-08-18 DIAGNOSIS — I110 Hypertensive heart disease with heart failure: Secondary | ICD-10-CM

## 2017-08-18 DIAGNOSIS — I5032 Chronic diastolic (congestive) heart failure: Secondary | ICD-10-CM

## 2017-08-18 LAB — POCT INR: INR: 1.8

## 2017-08-18 NOTE — Patient Instructions (Addendum)
Pre visit review using our clinic review tool, if applicable. No additional management support is needed unless otherwise documented below in the visit note.  Take 2 tablets today (1/15) and then change dosage and take 1 tablet daily except 2 tablets on Tuesdays.   Re-check in 4 weeks.

## 2017-08-20 ENCOUNTER — Telehealth: Payer: Self-pay | Admitting: Pulmonary Disease

## 2017-08-20 NOTE — Telephone Encounter (Signed)
PA request received from [facility] for Modafinil 100mg . Initiated PA via [email or web address]. Key: [id number] Determination expected within 72 hours. Routing to [employer] for follow-up.

## 2017-08-21 ENCOUNTER — Telehealth: Payer: Self-pay | Admitting: Pulmonary Disease

## 2017-08-21 NOTE — Telephone Encounter (Signed)
PA form has been received. It has been filled out and faxed back.

## 2017-08-24 NOTE — Telephone Encounter (Addendum)
Received a fax from [facility], Modafinil 100mg  was approved for 6 months until 02/17/2018 under Medicare part D. [facility] Rx was approved so they can run the Rx for pt. Nothing further is needed.

## 2017-08-25 NOTE — Telephone Encounter (Signed)
Followed up with PA for modafinil 100mg  from [place]  Outcome; the medication is Approved on January 18  Request Reference Number: [id number]. MODAFINIL TAB 100MG  is approved through 02/17/2018.  For further questions, call [phone]. Nothing further needed at this time

## 2017-08-31 ENCOUNTER — Other Ambulatory Visit: Payer: Self-pay | Admitting: Internal Medicine

## 2017-08-31 DIAGNOSIS — F418 Other specified anxiety disorders: Secondary | ICD-10-CM

## 2017-09-15 LAB — HM DIABETES EYE EXAM

## 2017-09-17 ENCOUNTER — Encounter: Payer: Self-pay | Admitting: Internal Medicine

## 2017-09-17 NOTE — Progress Notes (Signed)
Result extracted and sent to scan

## 2017-09-18 ENCOUNTER — Ambulatory Visit (INDEPENDENT_AMBULATORY_CARE_PROVIDER_SITE_OTHER): Payer: Medicare Other | Admitting: General Practice

## 2017-09-18 DIAGNOSIS — I4892 Unspecified atrial flutter: Secondary | ICD-10-CM | POA: Diagnosis not present

## 2017-09-18 DIAGNOSIS — Z7901 Long term (current) use of anticoagulants: Secondary | ICD-10-CM

## 2017-09-18 LAB — POCT INR: INR: 2.7

## 2017-09-18 NOTE — Patient Instructions (Addendum)
Pre visit review using our clinic review tool, if applicable. No additional management support is needed unless otherwise documented below in the visit note.  Continue to take 1 tablet daily except 2 tablets on Tuesdays.   Re-check in 4 weeks.

## 2017-09-23 ENCOUNTER — Ambulatory Visit: Payer: Self-pay | Admitting: Internal Medicine

## 2017-09-28 ENCOUNTER — Encounter: Payer: Self-pay | Admitting: Internal Medicine

## 2017-09-28 ENCOUNTER — Ambulatory Visit: Payer: Medicare Other | Admitting: Internal Medicine

## 2017-09-28 ENCOUNTER — Other Ambulatory Visit: Payer: Self-pay | Admitting: Internal Medicine

## 2017-09-28 VITALS — BP 134/66 | HR 99 | Ht 65.0 in | Wt 177.0 lb

## 2017-09-28 DIAGNOSIS — I4892 Unspecified atrial flutter: Secondary | ICD-10-CM

## 2017-09-28 DIAGNOSIS — E118 Type 2 diabetes mellitus with unspecified complications: Secondary | ICD-10-CM

## 2017-09-28 DIAGNOSIS — I422 Other hypertrophic cardiomyopathy: Secondary | ICD-10-CM

## 2017-09-28 DIAGNOSIS — I442 Atrioventricular block, complete: Secondary | ICD-10-CM | POA: Diagnosis not present

## 2017-09-28 DIAGNOSIS — I481 Persistent atrial fibrillation: Secondary | ICD-10-CM

## 2017-09-28 DIAGNOSIS — Z950 Presence of cardiac pacemaker: Secondary | ICD-10-CM | POA: Diagnosis not present

## 2017-09-28 DIAGNOSIS — I5032 Chronic diastolic (congestive) heart failure: Secondary | ICD-10-CM

## 2017-09-28 DIAGNOSIS — I110 Hypertensive heart disease with heart failure: Secondary | ICD-10-CM

## 2017-09-28 DIAGNOSIS — M1000 Idiopathic gout, unspecified site: Secondary | ICD-10-CM

## 2017-09-28 DIAGNOSIS — I4819 Other persistent atrial fibrillation: Secondary | ICD-10-CM

## 2017-09-28 NOTE — Patient Instructions (Addendum)
Medication Instructions:  Your physician recommends that you continue on your current medications as directed. Please refer to the Current Medication list given to you today.  Prescription given for amoxicillin for prior to procedures. Labwork: None ordered.  Testing/Procedures: None ordered.  Follow-Up: Your physician wants you to follow-up in: one year with Chanetta Marshall,  NP.   You will receive a reminder letter in the mail two months in advance. If you don't receive a letter, please call our office to schedule the follow-up appointment.  Remote monitoring is used to monitor your Pacemaker from home. This monitoring reduces the number of office visits required to check your device to one time per year. It allows [place] to keep an eye on the functioning of your device to ensure it is working properly. You are scheduled for a device check from home on 12/29/2017. You may send your transmission at any time that day. If you have a wireless device, the transmission will be sent automatically. After your physician reviews your transmission, you will receive a postcard with your next transmission date.  Any Other Special Instructions Will Be Listed Below (If Applicable).  If you need a refill on your cardiac medications before your next appointment, please call your pharmacy.

## 2017-09-28 NOTE — Progress Notes (Signed)
PCP: Janith Lima, MD Primary Cardiologist:  Dr Johnsie Cancel Primary EP:  Dr Rayann Heman  Holly Simmons is a 77 y.o. female who presents today for routine electrophysiology followup.  Since last being seen in our clinic, the patient reports doing reasonably well.  She was hospitalized in September with pneumonia.  Today, she denies symptoms of palpitations, chest pain, shortness of breath,  dizziness, presyncope, or syncope.  + venous insufficiency with occasional edema.  The patient is otherwise without complaint today.   Past Medical History:  Diagnosis Date  . Allergic rhinitis   . Asthma    NOS w/ acute exacerbation  . Blood transfusion without reported diagnosis   . Colon polyps    TUBULAR ADENOMAS AND HYPERPLASTIC  . Complete heart block (HCC)    requiring PPM (MDT) post surgical myomectomy at [facility],  leads are epicardial with abdominal implant, high ventricular threshold at implant  . COPD (chronic obstructive pulmonary disease) ([place])   . Depressive disorder   . Diastolic dysfunction   . DM (diabetes mellitus) ([place])   . Gallstones   . GERD (gastroesophageal reflux disease)   . Gout 08/20/2012  . Heart murmur   . Hyperpotassemia   . Hypersomnia   . Hypertension   . Hypertrophic cardiomyopathy ([place])    s/p surgical myomectomy at [facility] 30/09 complicated by septal VSD post procedure requiring reoperation with patch closure and tricuspid valve replacement  . Kidney stones   . Myocardial infarction ([place]) 2011  . Obstructive sleep apnea    persistent daytime sleepiness despite cpap  . 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 Psoriasis   . Pyuria   . Renal insufficiency   . 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 Typical atrial flutter ([place] 9/15   Past Surgical History:  Procedure Laterality Date  . ABDOMINAL HYSTERECTOMY    . APPENDECTOMY    . BREAST CYST EXCISION    . CARDIOVERSION N/A 08/01/2014   Procedure: CARDIOVERSION;  Surgeon: PThayer Headings MD;  [place]  Service: Cardiovascular;  Laterality: N/A;  . CESAREAN SECTION    . CHOLECYSTECTOMY    . COLONOSCOPY    . EYE SURGERY  [id number]  left eye lens implant  . EYE SURGERY  [id number]  right eye lens implant  . INSERT / REPLACE / REMOVE PACEMAKER  07/13/2012  . PACEMAKER INSERTION  07/02/11   [facility]  high ventricular lead threshold at implant per Dr HWestley Gambles . PERMANENT PACEMAKER INSERTION N/A 07/13/2012   Procedure: PERMANENT PACEMAKER INSERTION;  Surgeon: JThompson Grayer MD;  [facility];  Service: Cardiovascular;  Laterality: N/A;  . POLYPECTOMY    . septal myomectomy for hypertrophic CM  [facility]23/11   [facility] by septal VSD requiring patch repair and tricuspid valve replacement  . TONSILLECTOMY    . TRICUSPID VALVE REPLACEMENT  11/12   Medtronic 242mMosaic tissue valve  . VSD REPAIR  06/2011    ROS- all systems are reviewed and negative except as per HPI above  Current Outpatient Medications  Medication Sig Dispense Refill  . allopurinol (ZYLOPRIM) 100 MG tablet TAKE 1 TABLET BY MOUTH DAILY. 90 tablet 0  . arformoterol (BROVANA) 15 MCG/2ML NEBU Take 2 mLs (15 mcg total) by nebulization 2 (two) times daily. 120 mL 0  . B Complex-C (B-COMPLEX WITH VITAMIN C) tablet Take 1 tablet by mouth daily.    .Marland Kitchen  Betamethasone Valerate 0.12 % foam APPLY TO SCALP AS DIRECTED 100 g 1  . blood glucose meter kit and supplies KIT Dispense based on patient and insurance preference. Use three times a day (before meals). E11.8 1 each 0  . budesonide (PULMICORT) 0.25 MG/2ML nebulizer solution Take 2 mLs (0.25 mg total) by nebulization 2 (two) times daily. 60 mL 0  . buPROPion (WELLBUTRIN XL) 150 MG 24 hr tablet TAKE 1 TABLET ONCE DAILY. 90 tablet 0  . Cholecalciferol 2000 UNITS TABS Take 1 tablet (2,000 Units total) by mouth daily. 90 tablet 3  . ciclopirox (LOPROX) 0.77 % SUSP Apply 1 Act topically 2 (two) times  daily. (Patient taking differently: Apply 1 Act topically as needed. ) 60 mL 2  . fexofenadine (ALLEGRA) 180 MG tablet Take 180 mg by mouth daily as needed for allergies.     . furosemide (LASIX) 40 MG tablet TAKE 1 TABLET ONCE DAILY. 90 tablet 0  . insulin lispro (HUMALOG) 100 UNIT/ML injection Insulin (Novolog) sliding scale for additional Novolog: administer 84mnutes before meals. Sugar <200- no additional units Sugar >200-250 - 6 additional units Sugar >251-300 - 8 additional units Sugar <351-350 - 10 additional units and call office. Sugar >400 - call office. 10 mL 0  . Insulin Syringe-Needle U-100 (INSULIN SYRINGE .5CC/30GX5/16") 30G X 5/16" 0.5 ML MISC 1 Units by Does not apply route 3 (three) times daily with meals. 100 each 1  . ipratropium (ATROVENT) 0.03 % nasal spray Place 2 sprays into both nostrils 3 (three) times daily as needed for rhinitis. 30 mL 2  . lamoTRIgine (LAMICTAL) 25 MG tablet TAKE 1 TABLET ONCE DAILY. 90 tablet 1  . metoprolol tartrate (LOPRESSOR) 25 MG tablet TAKE 1 TABLET TWICE DAILY. 180 tablet 1  . modafinil (PROVIGIL) 100 MG tablet Take 1 tablet (100 mg total) by mouth daily. 30 tablet 5  . Multiple Vitamin (MULTIVITAMIN) capsule Take 1 capsule by mouth daily.    .Marland KitchenPRESCRIPTION MEDICATION CPAP    . ranitidine (ZANTAC) 300 MG tablet TAKE ONE TABLET AT BEDTIME. 90 tablet 1  . TRADJENTA 5 MG TABS tablet TAKE 1 TABLET BY MOUTH DAILY. 90 tablet 0  . VIIBRYD 40 MG TABS TAKE 1 TABLET ONCE DAILY. 90 tablet 1  . warfarin (COUMADIN) 1 MG tablet Take 2 tablets (2 mg total) daily by mouth. 60 tablet 3  . warfarin (COUMADIN) 2.5 MG tablet TAKE AS DIRECTED PER COUMADIN CLINIC. 35 tablet 1   No current facility-administered medications for this visit.     Physical Exam: Vitals:   09/28/17 1606  BP: 134/66  Pulse: 99  Weight: 177 lb (80.3 kg)  Height: _0  (1.651 m)    GEN- The patient is well appearing, alert and oriented x 3 today.   Head- normocephalic,  atraumatic Eyes-  Sclera clear, conjunctiva pink Ears- hearing intact Oropharynx- clear Lungs- Clear to ausculation bilaterally, normal work of breathing Chest- pacemaker pocket is well healed Heart- Regular rate and rhythm (paced) Extremities- no clubbing, cyanosis, or edema  Pacemaker interrogation- reviewed in detail today,  See PACEART report   Assessment and Plan:  1. Symptomatic complete heart block Normal pacemaker function See Pace Art report No changes today Abdominal pacemaker with VVI backup also functioning normally  2. Persistent afib/ atypical atrial flutter Asymptomatic On coumadin  3. Hypertensive cardiovascular disease Stable No change required today  4. Hypertrophic CM/ valvular heart disease On coumadin Script for amoxicillin prophylaxis given today as per her request  [facility]  Return to see EP NP in a year Follow-up with Dr Johnsie Cancel as scheduled   Thompson Grayer MD, [facility] 09/28/2017 4:30 PM

## 2017-09-30 ENCOUNTER — Other Ambulatory Visit: Payer: Self-pay | Admitting: Internal Medicine

## 2017-09-30 DIAGNOSIS — L409 Psoriasis, unspecified: Secondary | ICD-10-CM

## 2017-09-30 MED ORDER — BETAMETHASONE VALERATE 0.12 % EX FOAM: CUTANEOUS | 2 refills | Status: DC

## 2017-10-16 ENCOUNTER — Ambulatory Visit (INDEPENDENT_AMBULATORY_CARE_PROVIDER_SITE_OTHER): Payer: Medicare Other | Admitting: General Practice

## 2017-10-16 DIAGNOSIS — Z7901 Long term (current) use of anticoagulants: Secondary | ICD-10-CM

## 2017-10-16 DIAGNOSIS — I4892 Unspecified atrial flutter: Secondary | ICD-10-CM | POA: Diagnosis not present

## 2017-10-16 LAB — POCT INR: INR: 2.5

## 2017-10-16 NOTE — Patient Instructions (Addendum)
Pre visit review using our clinic review tool, if applicable. No additional management support is needed unless otherwise documented below in the visit note.  Continue to take 1 tablet daily except 2 tablets on Tuesdays.   Re-check in 4 weeks.

## 2017-10-17 NOTE — Progress Notes (Signed)
Agree with management.  Lathon Adan J Ambrielle Kington, MD  

## 2017-10-25 ENCOUNTER — Other Ambulatory Visit: Payer: Self-pay | Admitting: Internal Medicine

## 2017-11-17 ENCOUNTER — Ambulatory Visit (INDEPENDENT_AMBULATORY_CARE_PROVIDER_SITE_OTHER)
Admission: RE | Admit: 2017-11-17 | Discharge: 2017-11-17 | Disposition: A | Discharge status: Home / Self Care | Payer: Medicare Other | Source: Ambulatory Visit | Attending: Internal Medicine | Admitting: Internal Medicine

## 2017-11-17 ENCOUNTER — Encounter: Payer: Self-pay | Admitting: Internal Medicine

## 2017-11-17 ENCOUNTER — Other Ambulatory Visit (INDEPENDENT_AMBULATORY_CARE_PROVIDER_SITE_OTHER): Payer: Medicare Other

## 2017-11-17 ENCOUNTER — Ambulatory Visit: Payer: Medicare Other | Admitting: Internal Medicine

## 2017-11-17 ENCOUNTER — Ambulatory Visit (INDEPENDENT_AMBULATORY_CARE_PROVIDER_SITE_OTHER): Payer: Medicare Other | Admitting: General Practice

## 2017-11-17 VITALS — BP 132/78 | HR 63 | Temp 97.6°F | Resp 16 | Ht 65.0 in | Wt 184.0 lb

## 2017-11-17 DIAGNOSIS — I480 Paroxysmal atrial fibrillation: Secondary | ICD-10-CM

## 2017-11-17 DIAGNOSIS — I5032 Chronic diastolic (congestive) heart failure: Secondary | ICD-10-CM

## 2017-11-17 DIAGNOSIS — I4892 Unspecified atrial flutter: Secondary | ICD-10-CM | POA: Diagnosis not present

## 2017-11-17 DIAGNOSIS — E118 Type 2 diabetes mellitus with unspecified complications: Secondary | ICD-10-CM

## 2017-11-17 DIAGNOSIS — E785 Hyperlipidemia, unspecified: Secondary | ICD-10-CM

## 2017-11-17 DIAGNOSIS — R059 Cough, unspecified: Secondary | ICD-10-CM | POA: Insufficient documentation

## 2017-11-17 DIAGNOSIS — R05 Cough: Secondary | ICD-10-CM

## 2017-11-17 DIAGNOSIS — J988 Other specified respiratory disorders: Secondary | ICD-10-CM | POA: Diagnosis not present

## 2017-11-17 DIAGNOSIS — Z7901 Long term (current) use of anticoagulants: Secondary | ICD-10-CM

## 2017-11-17 LAB — BASIC METABOLIC PANEL
BUN: 17 mg/dL (ref 6–23)
CALCIUM: 9.4 mg/dL (ref 8.4–10.5)
CO2: 31 mEq/L (ref 19–32)
Chloride: 102 mEq/L (ref 96–112)
Creatinine, Ser: 1.13 mg/dL (ref 0.40–1.20)
GFR: 49.98 mL/min — AB (ref >60.00)
Glucose, Bld: 117 mg/dL — ABNORMAL HIGH (ref 70–99)
Potassium: 3.9 mEq/L (ref 3.5–5.1)
Sodium: 139 mEq/L (ref 135–145)

## 2017-11-17 LAB — POCT INR: INR: 2.4

## 2017-11-17 LAB — LIPID PANEL
Cholesterol: 125 mg/dL (ref 0–200)
HDL: 48.8 mg/dL (ref >39.00)
LDL Cholesterol: 62 mg/dL (ref 0–99)
NonHDL: 76.19
TRIGLYCERIDES: 73 mg/dL (ref 0.0–149.0)
Total CHOL/HDL Ratio: 3
VLDL: 14.6 mg/dL (ref 0.0–40.0)

## 2017-11-17 LAB — BRAIN NATRIURETIC PEPTIDE: PRO B NATRI PEPTIDE: 300 pg/mL — AB (ref 0.0–100.0)

## 2017-11-17 LAB — POCT GLYCOSYLATED HEMOGLOBIN (HGB A1C): HEMOGLOBIN A1C: 6

## 2017-11-17 MED ORDER — CEFDINIR 300 MG PO CAPS: 300.0000 mg | ORAL_CAPSULE | Freq: Two times a day (BID) | ORAL | 0 refills | Status: AC

## 2017-11-17 NOTE — Progress Notes (Signed)
Subjective:  Patient ID: Holly Simmons, female    DOB: January 23, 1941  Age: 77 y.o. MRN: [id number]  CC: Diabetes; Cough; and Congestive Heart Failure   HPI BROOKLINN LONGBOTTOM presents for f/up -she complains of a 10-day history of cough that is productive of thick, yellow brown mucus.  She is also had a few episodes of shortness of breath, night sweats and wheezing.  She denies fever, chills, chest pain, or hemoptysis.  She also complains of weight gain and slightly worsening lower extremity edema.  He tells me she is compliant with her loop diuretic therapy.  Outpatient Medications Prior to Visit  Medication Sig Dispense Refill  . allopurinol (ZYLOPRIM) 100 MG tablet TAKE 1 TABLET BY MOUTH DAILY. 90 tablet 0  . arformoterol (BROVANA) 15 MCG/2ML NEBU Take 2 mLs (15 mcg total) by nebulization 2 (two) times daily. 120 mL 0  . B Complex-C (B-COMPLEX WITH VITAMIN C) tablet Take 1 tablet by mouth daily.    . Betamethasone Valerate 0.12 % foam APPLY TO SCALP QD 100 g 2  . budesonide (PULMICORT) 0.25 MG/2ML nebulizer solution Take 2 mLs (0.25 mg total) by nebulization 2 (two) times daily. 60 mL 0  . buPROPion (WELLBUTRIN XL) 150 MG 24 hr tablet TAKE 1 TABLET ONCE DAILY. 90 tablet 0  . Cholecalciferol 2000 UNITS TABS Take 1 tablet (2,000 Units total) by mouth daily. 90 tablet 3  . ciclopirox (LOPROX) 0.77 % SUSP APPLY 1 PUMP TWICE A DAY. 60 mL 0  . fexofenadine (ALLEGRA) 180 MG tablet Take 180 mg by mouth daily as needed for allergies.     . furosemide (LASIX) 40 MG tablet TAKE 1 TABLET ONCE DAILY. 90 tablet 0  . ipratropium (ATROVENT) 0.03 % nasal spray Place 2 sprays into both nostrils 3 (three) times daily as needed for rhinitis. 30 mL 2  . lamoTRIgine (LAMICTAL) 25 MG tablet TAKE 1 TABLET ONCE DAILY. 90 tablet 1  . metoprolol tartrate (LOPRESSOR) 25 MG tablet TAKE 1 TABLET BY MOUTH TWICE DAILY. 180 tablet 0  . modafinil (PROVIGIL) 100 MG tablet Take 1 tablet (100 mg total) by mouth  daily. 30 tablet 5  . PRESCRIPTION MEDICATION CPAP    . ranitidine (ZANTAC) 300 MG tablet TAKE ONE TABLET AT BEDTIME. 90 tablet 1  . VIIBRYD 40 MG TABS TAKE 1 TABLET ONCE DAILY. 90 tablet 1  . warfarin (COUMADIN) 1 MG tablet Take 2 tablets (2 mg total) daily by mouth. 60 tablet 3  . warfarin (COUMADIN) 2.5 MG tablet TAKE AS DIRECTED PER COUMADIN CLINIC. 35 tablet 0  . blood glucose meter kit and supplies KIT Dispense based on patient and insurance preference. Use three times a day (before meals). E11.8 1 each 0  . ciclopirox (LOPROX) 0.77 % SUSP Apply 1 Act topically 2 (two) times daily. (Patient taking differently: Apply 1 Act topically as needed. ) 60 mL 2  . insulin lispro (HUMALOG) 100 UNIT/ML injection Insulin (Novolog) sliding scale for additional Novolog: administer 72mnutes before meals. Sugar <200- no additional units Sugar >200-250 - 6 additional units Sugar >251-300 - 8 additional units Sugar <351-350 - 10 additional units and call office. Sugar >400 - call office. 10 mL 0  . Insulin Syringe-Needle U-100 (INSULIN SYRINGE .5CC/30GX5/16") 30G X 5/16" 0.5 ML MISC 1 Units by Does not apply route 3 (three) times daily with meals. 100 each 1  . Multiple Vitamin (MULTIVITAMIN) capsule Take 1 capsule by mouth daily.    . TRADJENTA 5 MG TABS  tablet TAKE 1 TABLET BY MOUTH DAILY. 90 tablet 0   No facility-administered medications prior to visit.     ROS Review of Systems  Constitutional: Positive for unexpected weight change. Negative for appetite change, chills, diaphoresis, fatigue and fever.  HENT: Negative.  Negative for sore throat.   Eyes: Negative for visual disturbance.  Respiratory: Positive for cough, shortness of breath and wheezing. Negative for choking, chest tightness and stridor.   Cardiovascular: Positive for leg swelling. Negative for chest pain and palpitations.  Gastrointestinal: Negative for abdominal pain, constipation, diarrhea, nausea and vomiting.  Endocrine:  Negative for polydipsia, polyphagia and polyuria.  Genitourinary: Negative.  Negative for difficulty urinating.  Musculoskeletal: Negative.  Negative for arthralgias and myalgias.  Skin: Negative.  Negative for color change and pallor.  Allergic/Immunologic: Negative.   Neurological: Negative.  Negative for dizziness, weakness and light-headedness.  Hematological: Negative for adenopathy. Does not bruise/bleed easily.  Psychiatric/Behavioral: Negative.     Objective:  BP 132/78 (BP Location: Left Arm, Patient Position: Sitting, Cuff Size: Normal)   Pulse 63   Temp 97.6 F (36.4 C) (Oral)   Resp 16   Ht 5' 5" (1.651 m)   Wt 184 lb (83.5 kg)   SpO2 97%   BMI 30.62 kg/m   BP Readings from Last 3 Encounters:  11/17/17 132/78  09/28/17 134/66  06/01/17 (!) 128/58    Wt Readings from Last 3 Encounters:  11/17/17 184 lb (83.5 kg)  09/28/17 177 lb (80.3 kg)  06/01/17 167 lb (75.8 kg)    Physical Exam  Constitutional: She is oriented to person, place, and time. No distress.  HENT:  Mouth/Throat: Oropharynx is clear and moist. No oropharyngeal exudate.  Eyes: Conjunctivae are normal.  Neck: Normal range of motion. Neck supple. No JVD present.  Cardiovascular: Normal rate, regular rhythm, S1 normal and S2 normal. Exam reveals no gallop and no friction rub.  Murmur heard.  Decrescendo diastolic murmur is present with a grade of 1/6. Pulmonary/Chest: No stridor. No tachypnea. No respiratory distress. She has no decreased breath sounds. She has no wheezes. She has no rhonchi. She has no rales.  Abdominal: Soft. Bowel sounds are normal. She exhibits no mass. There is no tenderness.  Musculoskeletal: She exhibits edema.  Trace LE pitting edema  Lymphadenopathy:    She has no cervical adenopathy.  Neurological: She is alert and oriented to person, place, and time.  Skin: Skin is warm and dry. She is not diaphoretic. No pallor.  Vitals reviewed.   Lab Results  Component Value  Date   WBC 6.7 06/01/2017   HGB 12.1 06/01/2017   HCT 37.4 06/01/2017   PLT 212.0 06/01/2017   GLUCOSE 117 (H) 11/17/2017   CHOL 125 11/17/2017   TRIG 73.0 11/17/2017   HDL 48.80 11/17/2017   LDLCALC 62 11/17/2017   ALT 14 05/04/2017   AST 35 05/04/2017   NA 139 11/17/2017   K 3.9 11/17/2017   CL 102 11/17/2017   CREATININE 1.13 11/17/2017   BUN 17 11/17/2017   CO2 31 11/17/2017   TSH 1.95 11/17/2017   INR 2.4 11/17/2017   HGBA1C 6.0 11/17/2017   MICROALBUR 22.8 (H) 06/04/2016    Dg Chest 2 View  Result Date: 06/01/2017 CLINICAL DATA:  Follow-up of pneumonia. No current complaints. History of COPD, valvular heart disease, previous MI. EXAM: CHEST  2 VIEW COMPARISON:  Portable chest x-ray of May 03, 2017 FINDINGS: Infiltrate on the left is nearly totally cleared. The right lung is   clear. Both lungs exhibit emphysematous changes. The cardiac silhouette remains enlarged. The pulmonary vascularity is normal. The sternal wires are intact. The ICD is in stable position. There are degenerative changes of both shoulders. IMPRESSION: COPD. Stable cardiomegaly. No CHF. Near total clearing of infiltrate in the superior aspect of the left lower lobe. Electronically Signed   By: David  Jordan M.D.   On: 06/01/2017 10:01    Assessment & Plan:   Chiyeko was seen today for diabetes, cough and congestive heart failure.  Diagnoses and all orders for this visit:  Type 2 diabetes mellitus with complication, without long-term current use of insulin (HCC)-her A1c is down to 6.0%.  I recommended that she stop using the basal insulin and a DPP 4 inhibitor. -     Basic metabolic panel; Future -     POCT glycosylated hemoglobin (Hb A1C)  Hyperlipidemia with target LDL less than 100- She has achieved her LDL goal statin therapy is not indicated. -     Lipid panel; Future -     Thyroid Panel With TSH; Future  Paroxysmal atrial fibrillation (HCC)-she has good rate and rhythm control.  We will  continue anticoagulation with warfarin. -     Thyroid Panel With TSH; Future  Cough- Her chest x-ray is negative for mass, infiltrate, or edema. -     DG Chest 2 View; Future  Chronic diastolic heart failure (HCC)-based on her symptoms, exam, and slightly elevated BNP she has mild fluid overload.  I have asked her to be compliant with her loop diuretic therapy and to follow-up with her cardiologist if needed. -     Brain natriuretic peptide; Future  RTI (respiratory tract infection) -     cefdinir (OMNICEF) 300 MG capsule; Take 1 capsule (300 mg total) by mouth 2 (two) times daily for 10 days.   I have discontinued Krissa L. Diver's multivitamin, blood glucose meter kit and supplies, insulin lispro, INSULIN SYRINGE .5CC/30GX5/16", and TRADJENTA. I am also having her start on cefdinir. Additionally, I am having her maintain her fexofenadine, B-complex with vitamin C, Cholecalciferol, PRESCRIPTION MEDICATION, ipratropium, arformoterol, budesonide, warfarin, VIIBRYD, ranitidine, modafinil, lamoTRIgine, allopurinol, furosemide, buPROPion, Betamethasone Valerate, warfarin, metoprolol tartrate, and ciclopirox.  Meds ordered this encounter  Medications  . cefdinir (OMNICEF) 300 MG capsule    Sig: Take 1 capsule (300 mg total) by mouth 2 (two) times daily for 10 days.    Dispense:  20 capsule    Refill:  0     Follow-up: Return in about 3 weeks (around 12/08/2017).   , MD 

## 2017-11-17 NOTE — Patient Instructions (Addendum)
Pre visit review using our clinic review tool, if applicable. No additional management support is needed unless otherwise documented below in the visit note.  Continue to take 1 tablet daily except 2 tablets on Tuesdays.   Re-check in 6 weeks.

## 2017-11-17 NOTE — Patient Instructions (Signed)
Cough, Adult  Coughing is a reflex that clears your throat and your airways. Coughing helps to heal and protect your lungs. It is normal to cough occasionally, but a cough that happens with other symptoms or lasts a long time may be a sign of a condition that needs treatment. A cough may last only 2-3 weeks (acute), or it may last longer than 8 weeks (chronic).  What are the causes?  Coughing is commonly caused by:   Breathing in substances that irritate your lungs.   A viral or bacterial respiratory infection.   Allergies.   Asthma.   Postnasal drip.   Smoking.   Acid backing up from the stomach into the esophagus (gastroesophageal reflux).   Certain medicines.   Chronic lung problems, including COPD (or rarely, lung cancer).   Other medical conditions such as heart failure.    Follow these instructions at home:  Pay attention to any changes in your symptoms. Take these actions to help with your discomfort:   Take medicines only as told by your health care provider.  ? If you were prescribed an antibiotic medicine, take it as told by your health care provider. Do not stop taking the antibiotic even if you start to feel better.  ? Talk with your health care provider before you take a cough suppressant medicine.   Drink enough fluid to keep your urine clear or pale yellow.   If the air is dry, use a cold steam vaporizer or humidifier in your bedroom or your home to help loosen secretions.   Avoid anything that causes you to cough at work or at home.   If your cough is worse at night, try sleeping in a semi-upright position.   Avoid cigarette smoke. If you smoke, quit smoking. If you need help quitting, ask your health care provider.   Avoid caffeine.   Avoid alcohol.   Rest as needed.    Contact a health care provider if:   You have new symptoms.   You cough up pus.   Your cough does not get better after 2-3 weeks, or your cough gets worse.   You cannot control your cough with suppressant  medicines and you are losing sleep.   You develop pain that is getting worse or pain that is not controlled with pain medicines.   You have a fever.   You have unexplained weight loss.   You have night sweats.  Get help right away if:   You cough up blood.   You have difficulty breathing.   Your heartbeat is very fast.  This information is not intended to replace advice given to you by your health care provider. Make sure you discuss any questions you have with your health care provider.  Document Released: 01/17/2011 Document Revised: 12/27/2015 Document Reviewed: 09/27/2014  Elsevier Interactive Patient Education  2018 [employer]

## 2017-11-17 NOTE — Progress Notes (Signed)
I have reviewed and agree.

## 2017-11-18 ENCOUNTER — Encounter: Payer: Self-pay | Admitting: Internal Medicine

## 2017-11-18 LAB — THYROID PANEL WITH TSH
FREE THYROXINE INDEX: 3 (ref 1.4–3.8)
T3 Uptake: 29 % (ref 22–35)
T4 TOTAL: 10.3 ug/dL (ref 5.1–11.9)
TSH: 1.95 mIU/L (ref 0.40–4.50)

## 2017-11-25 ENCOUNTER — Ambulatory Visit: Payer: Self-pay | Admitting: Internal Medicine

## 2017-11-26 ENCOUNTER — Other Ambulatory Visit: Payer: Self-pay | Admitting: Pulmonary Disease

## 2017-11-26 ENCOUNTER — Other Ambulatory Visit: Payer: Self-pay | Admitting: Internal Medicine

## 2017-12-29 ENCOUNTER — Ambulatory Visit (INDEPENDENT_AMBULATORY_CARE_PROVIDER_SITE_OTHER): Payer: Medicare Other | Admitting: General Practice

## 2017-12-29 ENCOUNTER — Other Ambulatory Visit: Payer: Self-pay | Admitting: Internal Medicine

## 2017-12-29 ENCOUNTER — Ambulatory Visit (INDEPENDENT_AMBULATORY_CARE_PROVIDER_SITE_OTHER): Payer: Medicare Other | Admitting: *Deleted

## 2017-12-29 DIAGNOSIS — I442 Atrioventricular block, complete: Secondary | ICD-10-CM

## 2017-12-29 DIAGNOSIS — I5032 Chronic diastolic (congestive) heart failure: Secondary | ICD-10-CM

## 2017-12-29 DIAGNOSIS — I4892 Unspecified atrial flutter: Secondary | ICD-10-CM

## 2017-12-29 DIAGNOSIS — Z7901 Long term (current) use of anticoagulants: Secondary | ICD-10-CM

## 2017-12-29 DIAGNOSIS — M1000 Idiopathic gout, unspecified site: Secondary | ICD-10-CM

## 2017-12-29 DIAGNOSIS — I422 Other hypertrophic cardiomyopathy: Secondary | ICD-10-CM

## 2017-12-29 DIAGNOSIS — I110 Hypertensive heart disease with heart failure: Secondary | ICD-10-CM

## 2017-12-29 LAB — POCT INR: INR: 2.2 (ref 2.0–3.0)

## 2017-12-29 NOTE — Progress Notes (Signed)
Remote pacemaker transmission.   

## 2017-12-29 NOTE — Patient Instructions (Addendum)
Pre visit review using our clinic review tool, if applicable. No additional management support is needed unless otherwise documented below in the visit note.  Continue to take 1 tablet daily except 2 tablets on Tuesdays.   Re-check in 6 weeks.

## 2017-12-31 ENCOUNTER — Telehealth: Payer: Self-pay | Admitting: Internal Medicine

## 2017-12-31 LAB — CUP PACEART REMOTE DEVICE CHECK
Battery Impedance: 381 Ohm
Brady Statistic AP VP Percent: 39 %
Brady Statistic AS VP Percent: 60 %
Brady Statistic AS VS Percent: 1 %
Implantable Lead Implant Date: 20131210
Implantable Lead Location: 753858
Implantable Lead Model: 4296
Implantable Lead Model: 5076
Implantable Pulse Generator Implant Date: 20131210
Lead Channel Impedance Value: 461 Ohm
Lead Channel Impedance Value: 893 Ohm
Lead Channel Pacing Threshold Amplitude: 0.875 V
Lead Channel Pacing Threshold Pulse Width: 0.4 ms
Lead Channel Setting Pacing Amplitude: 2.5 V
Lead Channel Setting Sensing Sensitivity: 8 mV
MDC IDC LEAD IMPLANT DT: 20131210
MDC IDC LEAD LOCATION: 753859
MDC IDC MSMT BATTERY REMAINING LONGEVITY: 94 mo
MDC IDC MSMT BATTERY VOLTAGE: 2.78 V
MDC IDC MSMT LEADCHNL RV PACING THRESHOLD AMPLITUDE: 0.75 V
MDC IDC MSMT LEADCHNL RV PACING THRESHOLD PULSEWIDTH: 0.4 ms
MDC IDC SESS DTM: 20190528122412
MDC IDC SET LEADCHNL RA PACING AMPLITUDE: 2 V
MDC IDC SET LEADCHNL RV PACING PULSEWIDTH: 0.4 ms
MDC IDC STAT BRADY AP VS PERCENT: 0 %

## 2017-12-31 NOTE — Telephone Encounter (Signed)
Left message for patient regarding her Prolia injection.  Insurance was verified and summary of benefits states that patient would be responsible for a $90 copay. Patient is due on or after June 19th. Left message for patient to call back to schedule.

## 2018-01-01 ENCOUNTER — Encounter: Payer: Self-pay | Admitting: Cardiology

## 2018-01-01 ENCOUNTER — Telehealth: Payer: Self-pay | Admitting: Internal Medicine

## 2018-01-01 NOTE — Telephone Encounter (Signed)
Copied from Tony [id number]. Topic: Quick Communication - See Telephone Encounter >> Jan 01, 2018  3:33 PM Arletha Grippe wrote: CRM for notification. See Telephone encounter for: 01/01/18. Pt is asking for call back to find out information - she would not be any more specific. [phone]

## 2018-01-04 ENCOUNTER — Telehealth: Payer: Self-pay | Admitting: Cardiovascular Disease

## 2018-01-04 DIAGNOSIS — I361 Nonrheumatic tricuspid (valve) insufficiency: Secondary | ICD-10-CM

## 2018-01-04 DIAGNOSIS — I421 Obstructive hypertrophic cardiomyopathy: Secondary | ICD-10-CM

## 2018-01-04 NOTE — Telephone Encounter (Signed)
NEW MESSAGE    Please attach order for echo to appt in August , patient states she always have echo before her appt with Dr Johnsie Cancel

## 2018-01-04 NOTE — Telephone Encounter (Signed)
Placed order for patient to have her yearly echo.

## 2018-01-19 ENCOUNTER — Telehealth: Payer: Self-pay | Admitting: Emergency Medicine

## 2018-01-19 NOTE — Telephone Encounter (Signed)
Called patient to schedule AWV. Pt declined at this time.

## 2018-01-20 ENCOUNTER — Ambulatory Visit (INDEPENDENT_AMBULATORY_CARE_PROVIDER_SITE_OTHER): Payer: Medicare Other

## 2018-01-20 DIAGNOSIS — M80071S Age-related osteoporosis with current pathological fracture, right ankle and foot, sequela: Secondary | ICD-10-CM

## 2018-01-20 MED ORDER — DENOSUMAB 60 MG/ML ~~LOC~~ SOSY
60.0000 mg | PREFILLED_SYRINGE | Freq: Once | SUBCUTANEOUS | Status: AC
  Administered 2018-01-20: 60 mg via SUBCUTANEOUS

## 2018-01-27 ENCOUNTER — Other Ambulatory Visit: Payer: Self-pay | Admitting: Internal Medicine

## 2018-02-09 ENCOUNTER — Ambulatory Visit (INDEPENDENT_AMBULATORY_CARE_PROVIDER_SITE_OTHER): Payer: Medicare Other | Admitting: General Practice

## 2018-02-09 ENCOUNTER — Other Ambulatory Visit: Payer: Self-pay | Admitting: General Practice

## 2018-02-09 DIAGNOSIS — Z7901 Long term (current) use of anticoagulants: Secondary | ICD-10-CM | POA: Diagnosis not present

## 2018-02-09 DIAGNOSIS — I4892 Unspecified atrial flutter: Secondary | ICD-10-CM | POA: Diagnosis not present

## 2018-02-09 LAB — POCT INR: INR: 1.8 — AB (ref 2.0–3.0)

## 2018-02-09 MED ORDER — WARFARIN SODIUM 2.5 MG PO TABS: ORAL_TABLET | ORAL | 3 refills | Status: DC

## 2018-02-09 NOTE — Patient Instructions (Addendum)
Pre visit review using our clinic review tool, if applicable. No additional management support is needed unless otherwise documented below in the visit note.  Take 3 tablets today (7/9) and then take 1 tablet daily except 2 tablets on Tuesdays and Fridays.  Re-check in 4 weeks.

## 2018-02-19 ENCOUNTER — Emergency Department (HOSPITAL_COMMUNITY)
Admission: EM | Admit: 2018-02-19 | Discharge: 2018-02-19 | Disposition: A | Discharge status: Home / Self Care | Payer: Medicare Other | Attending: Emergency Medicine | Admitting: Emergency Medicine

## 2018-02-19 ENCOUNTER — Encounter (HOSPITAL_COMMUNITY): Payer: Self-pay | Admitting: *Deleted

## 2018-02-19 ENCOUNTER — Other Ambulatory Visit: Payer: Self-pay

## 2018-02-19 DIAGNOSIS — J45909 Unspecified asthma, uncomplicated: Secondary | ICD-10-CM | POA: Insufficient documentation

## 2018-02-19 DIAGNOSIS — Z950 Presence of cardiac pacemaker: Secondary | ICD-10-CM | POA: Diagnosis not present

## 2018-02-19 DIAGNOSIS — I110 Hypertensive heart disease with heart failure: Secondary | ICD-10-CM | POA: Insufficient documentation

## 2018-02-19 DIAGNOSIS — I5032 Chronic diastolic (congestive) heart failure: Secondary | ICD-10-CM | POA: Diagnosis not present

## 2018-02-19 DIAGNOSIS — E119 Type 2 diabetes mellitus without complications: Secondary | ICD-10-CM | POA: Diagnosis not present

## 2018-02-19 DIAGNOSIS — R040 Epistaxis: Secondary | ICD-10-CM | POA: Insufficient documentation

## 2018-02-19 DIAGNOSIS — Z7901 Long term (current) use of anticoagulants: Secondary | ICD-10-CM | POA: Insufficient documentation

## 2018-02-19 DIAGNOSIS — Z79899 Other long term (current) drug therapy: Secondary | ICD-10-CM | POA: Diagnosis not present

## 2018-02-19 LAB — COMPREHENSIVE METABOLIC PANEL
ALBUMIN: 4.1 g/dL (ref 3.5–5.0)
ALK PHOS: 120 U/L (ref 38–126)
ALT: 11 U/L (ref 0–44)
ANION GAP: 9 (ref 5–15)
AST: 24 U/L (ref 15–41)
BILIRUBIN TOTAL: 0.9 mg/dL (ref 0.3–1.2)
BUN: 20 mg/dL (ref 8–23)
CALCIUM: 9.7 mg/dL (ref 8.9–10.3)
CO2: 27 mmol/L (ref 22–32)
CREATININE: 1.55 mg/dL — AB (ref 0.44–1.00)
Chloride: 102 mmol/L (ref 98–111)
GFR calc Af Amer: 37 mL/min — ABNORMAL LOW (ref >60)
GFR calc non Af Amer: 32 mL/min — ABNORMAL LOW (ref >60)
GLUCOSE: 100 mg/dL — AB (ref 70–99)
Potassium: 4.2 mmol/L (ref 3.5–5.1)
SODIUM: 138 mmol/L (ref 135–145)
TOTAL PROTEIN: 6.9 g/dL (ref 6.5–8.1)

## 2018-02-19 LAB — CBC
HCT: 33.9 % — ABNORMAL LOW (ref 36.0–46.0)
HEMOGLOBIN: 10.6 g/dL — AB (ref 12.0–15.0)
MCH: 29.8 pg (ref 26.0–34.0)
MCHC: 31.3 g/dL (ref 30.0–36.0)
MCV: 95.2 fL (ref 78.0–100.0)
Platelets: 137 10*3/uL — ABNORMAL LOW (ref 150–400)
RBC: 3.56 MIL/uL — ABNORMAL LOW (ref 3.87–5.11)
RDW: 15.1 % (ref 11.5–15.5)
WBC: 6.6 10*3/uL (ref 4.0–10.5)

## 2018-02-19 LAB — PROTIME-INR
INR: 2.15
PROTHROMBIN TIME: 23.8 s — AB (ref 11.4–15.2)

## 2018-02-19 MED ORDER — OXYMETAZOLINE HCL 0.05 % NA SOLN
1.0000 | Freq: Once | NASAL | Status: AC
  Administered 2018-02-19: 1 via NASAL
  Filled 2018-02-19: qty 15

## 2018-02-19 NOTE — ED Notes (Signed)
ED Provider at bedside. 

## 2018-02-19 NOTE — Discharge Instructions (Addendum)
Try not to blow your nose, pick at, or irritate your nose. If your nose starts bleeding, you may use Afrin to help resolve the bleeding.  Hold direct pressure for 20 minutes. Continue taking your medications as prescribed. Follow-up with Dr. Janace Hoard of ENT for further evaluation if your symptoms continue. Return to the emergency room for nosebleeds that are lasting over 30 minutes with treatment, or any new, worsening, or concerning symptoms.  Return if you feel weak, dizzy, have chest pain, or shortness of breath.

## 2018-02-19 NOTE — ED Notes (Addendum)
Due to high PT, will prioritize rooming for patient

## 2018-02-19 NOTE — ED Provider Notes (Signed)
MSE was initiated and I personally evaluated the patient and placed orders (if any) at  4:17 PM on February 19, 2018.  The patient appears stable so that the remainder of the MSE may be completed by another provider.  Patient placed in Quick Look pathway, seen and evaluated   Chief Complaint: Nosebleed  HPI: 77 year old female currently on Coumadin presents for intermittent nosebleeds for the past 3 days.  States that she has had 5-6 episodes daily which usually resolved with direct pressure.  She blew her nose this morning and saw a large clot.  Denies any lightheadedness, dizziness.  ROS: Nosebleed  Physical Exam:   Gen: No distress  Neuro: Awake and Alert  Skin: Warm    Focused Exam: No active bleeding at this time.  No clot noted in nose.   Initiation of care has begun. The patient has been counseled on the process, plan, and necessity for staying for the completion/evaluation, and the remainder of the medical screening examination    Delia Heady, PA-C 02/19/18 1618    Tegeler, Gwenyth Allegra, MD 02/20/18 0100

## 2018-02-19 NOTE — ED Notes (Signed)
Patient verbalizes understanding of discharge instructions. Opportunity for questioning and answers were provided. 

## 2018-02-19 NOTE — ED Provider Notes (Signed)
[facility] EMERGENCY DEPARTMENT Provider Note   CSN: [id number] Arrival date & time: 02/19/18  1603     History   Chief Complaint Chief Complaint  Patient presents with  . Epistaxis    HPI Holly Simmons is a 77 y.o. female presenting for evaluation of nosebleeds.  Patient states of the past 3 days, she has been having very frequent nosebleeds.  She is having at least 4 episodes a day, lasting approximately 30 minutes.  Resolving without intervention.  Patient states she is on Coumadin, has been having issues with nosebleeds for the past 7 years since she was started on Coumadin.  Normally, however, her nosebleeds are once a day.  She states this is much more frequent than normal.  Bleeding is always from the left nare.  She denies dizziness, weakness, chest pain, shortness of breath.  She denies cough, fever, or chills.  She denies pain.  Each episode begins when she blows her nose.  She came in today, as she was having more clots coming from her nose, and had bleeding lasting approximately 45 minutes.  Patient has had no bleeding since she came to the ER.  Patient and son state they called the ENT doctor's office today and was told to come to the emergency room and Dr. Janace Hoard would see them here, as he was on-call for ENT.  HPI  Past Medical History:  Diagnosis Date  . Allergic rhinitis   . Asthma    NOS w/ acute exacerbation  . Blood transfusion without reported diagnosis   . Colon polyps    TUBULAR ADENOMAS AND HYPERPLASTIC  . Complete heart block (HCC)    requiring PPM (MDT) post surgical myomectomy at [facility],  leads are epicardial with abdominal implant, high ventricular threshold at implant  . COPD (chronic obstructive pulmonary disease) ([place])   . Depressive disorder   . Diastolic dysfunction   . DM (diabetes mellitus) ([place])   . Gallstones   . GERD (gastroesophageal reflux disease)   . Gout 08/20/2012  . Heart murmur   . Hyperpotassemia   .  Hypersomnia   . Hypertension   . Hypertrophic cardiomyopathy ([place])    s/p surgical myomectomy at [facility] 93/81 complicated by septal VSD post procedure requiring reoperation with patch closure and tricuspid valve replacement  . Kidney stones   . Myocardial infarction ([place]) 2011  . Obstructive sleep apnea    persistent daytime sleepiness despite cpap  . 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 Psoriasis   . Pyuria   . Renal insufficiency   . 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 Typical atrial flutter ([place]) 9/15    Patient Active Problem List   Diagnosis Date Noted  . Cough 11/17/2017  . RTI (respiratory tract infection) 11/17/2017  . 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 Obesity (BMI 30.0-34.9) 02/05/2016  . Osteoporosis with pathological fracture of ankle and foot 05/30/2015  . Encounter for therapeutic drug monitoring 11/22/2014  . Atrial flutter ([place]) 05/08/2014  . Complete heart block ([place]) 07/29/2013  . Gout 08/20/2012  . Eczema 07/07/2012  . 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 Elevated total protein 05/14/2012  . Other screening mammogram 02/04/2012  . Hyperlipidemia with target LDL less than 100 12/04/2011  . 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 Type II diabetes mellitus with manifestations ([place]) 08/25/2011  . 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 Long term (current) use of anticoagulants 07/18/2011  . Atrial fibrillation ([place]) 07/18/2011  .  Routine general medical examination at a health care facility 05/15/2011  . Tinea of the body 09/12/2010  . Hypertrophic obstructive cardiomyopathy ([place]) 05/22/2010  . Hypersomnia 08/16/2009  . Depression with anxiety 11/28/2008  . Chronic diastolic heart failure ([place]) 08/25/2008  . COPD (chronic obstructive pulmonary disease) with acute bronchitis ([place]) 08/10/2008  . GERD 08/10/2008  . Obstructive sleep apnea 08/10/2007  . Allergic rhinitis 08/09/2007    Past Surgical History:    Procedure Laterality Date  . ABDOMINAL HYSTERECTOMY    . APPENDECTOMY    . BREAST CYST EXCISION    . CARDIOVERSION N/A 08/01/2014   Procedure: CARDIOVERSION;  Surgeon: Thayer Headings, MD;  Location: [facility];  Service: Cardiovascular;  Laterality: N/A;  . CESAREAN SECTION    . CHOLECYSTECTOMY    . COLONOSCOPY    . EYE SURGERY  [id number]   left eye lens implant  . EYE SURGERY  [id number]   right eye lens implant  . INSERT / REPLACE / REMOVE PACEMAKER  07/13/2012  . PACEMAKER INSERTION  07/02/11   epicardial wires with abdominal implant at [facility] 12/12,  high ventricular lead threshold at implant per Dr Westley Gambles  . PERMANENT PACEMAKER INSERTION N/A 07/13/2012   Procedure: PERMANENT PACEMAKER INSERTION;  Surgeon: Thompson Grayer, MD;  Location: [facility] CATH LAB;  Service: Cardiovascular;  Laterality: N/A;  . POLYPECTOMY    . septal myomectomy for hypertrophic CM  06/23/11   by Dr Evelina Dun at [facility], complicated by septal VSD requiring patch repair and tricuspid valve replacement  . TONSILLECTOMY    . TRICUSPID VALVE REPLACEMENT  11/12   Medtronic 88mm Mosaic tissue valve  . VSD REPAIR  06/2011     OB History   None      Home Medications    Prior to Admission medications   Medication Sig Start Date End Date Taking? Authorizing Provider  allopurinol (ZYLOPRIM) 100 MG tablet TAKE 1 TABLET BY MOUTH DAILY. Patient taking differently: TAKE 100mg  TABLET BY MOUTH DAILY. 12/29/17  Yes Janith Lima, MD  B Complex-C (B-COMPLEX WITH VITAMIN C) tablet Take 1 tablet by mouth daily.   Yes [provider]  Betamethasone Valerate 0.12 % foam APPLY TO SCALP QD Patient taking differently: Apply 1 application topically once a week. Apply to scalp. 09/30/17  Yes Janith Lima, MD  buPROPion (WELLBUTRIN XL) 150 MG 24 hr tablet TAKE 1 TABLET ONCE DAILY. Patient taking differently: TAKE 150mg  TABLET ONCE DAILY. 12/29/17  Yes Janith Lima, MD  Cholecalciferol 2000 UNITS TABS Take 1 tablet (2,000  Units total) by mouth daily. 05/31/15  Yes Janith Lima, MD  ciclopirox (LOPROX) 0.77 % SUSP APPLY 1 PUMP TWICE A DAY. 10/25/17  Yes Janith Lima, MD  fexofenadine (ALLEGRA) 180 MG tablet Take 180 mg by mouth daily as needed for allergies.    Yes [provider]  furosemide (LASIX) 40 MG tablet TAKE 1 TABLET ONCE DAILY. Patient taking differently: TAKE 40mg  ONCE DAILY. 12/29/17  Yes Janith Lima, MD  ipratropium (ATROVENT) 0.03 % nasal spray USE 2 SPRAYS IN EACH NOSTRIL 3 TIMES A DAY AS NEEDED. 11/26/17  Yes [name], MD  lamoTRIgine (LAMICTAL) 25 MG tablet TAKE 1 TABLET ONCE DAILY. Patient taking differently: TAKE 25mg  ONCE DAILY. 08/31/17  Yes Janith Lima, MD  metoprolol tartrate (LOPRESSOR) 25 MG tablet TAKE 1 TABLET BY MOUTH TWICE DAILY. Patient taking differently: TAKE 25mg  BY MOUTH TWICE DAILY. 01/27/18  Yes Janith Lima, MD  modafinil (PROVIGIL) 100  MG tablet Take 1 tablet (100 mg total) by mouth daily. 08/12/17  Yes [name], MD  PRESCRIPTION MEDICATION CPAP   Yes [provider]  ranitidine (ZANTAC) 300 MG tablet TAKE ONE TABLET AT BEDTIME. Patient taking differently: TAKE 300mg  AT BEDTIME. 12/29/17  Yes Janith Lima, MD  SYMBICORT 80-4.5 MCG/ACT inhaler USE 2 PUFFS TWICE A DAY. Patient taking differently: INHALE 2 PUFFS TWICE A DAY. 11/26/17  Yes Janith Lima, MD  TRADJENTA 5 MG TABS tablet TAKE 1 TABLET BY MOUTH DAILY. Patient taking differently: TAKE 5mg  BY MOUTH DAILY. 12/29/17  Yes Janith Lima, MD  VIIBRYD 40 MG TABS TAKE 1 TABLET ONCE DAILY. Patient taking differently: TAKE 40mg  BY MOUTH DAILY. 07/01/17  Yes Janith Lima, MD  warfarin (COUMADIN) 2.5 MG tablet TAKE AS DIRECTED PER COUMADIN CLINIC. Patient taking differently: Take 2.5-5 mg by mouth See admin instructions. Take 2.5mg  by mouth daily on Monday, Wednesday, Thursday, Saturday & Sunday , Then take 5mg  by mouth daily on Tuesdays & Fridays 02/09/18  Yes Janith Lima, MD    arformoterol (BROVANA) 15 MCG/2ML NEBU Take 2 mLs (15 mcg total) by nebulization 2 (two) times daily. Patient not taking: Reported on 02/19/2018 05/08/17   Patrecia Pour, Christean Grief, MD  budesonide (PULMICORT) 0.25 MG/2ML nebulizer solution Take 2 mLs (0.25 mg total) by nebulization 2 (two) times daily. Patient not taking: Reported on 02/19/2018 05/08/17   Patrecia Pour, Christean Grief, MD  ciclopirox (LOPROX) 0.77 % SUSP APPLY 1 PUMP TWICE A DAY. Patient not taking: Reported on 02/19/2018 11/26/17   Janith Lima, MD  Alum & Mag Hydroxide-Simeth (MAGIC MOUTHWASH) SOLN Take 5 mLs by mouth 3 (three) times daily. 14 days then stop   10/17/11  [provider]    Family History Family History  Problem Relation Age of Onset  . Hypertension Daughter   . Heart disease Maternal Grandfather   . Heart disease Paternal Grandfather   . Bladder Cancer Father   . Prostate cancer Father   . Cancer Father   . Varicose Veins Father   . Heart attack Father   . Breast cancer Mother   . Dementia Mother   . Hypertension Mother   . Cancer Mother   . Ovarian cancer Maternal Aunt   . Pancreatic cancer Cousin   . Breast cancer Paternal Aunt   . Dementia Maternal Aunt        x 2  . Dementia Maternal Uncle        x 2  . Colon cancer Neg Hx     Social History Social History   Tobacco Use  . Smoking status: Never Smoker  . Smokeless tobacco: Never Used  Substance Use Topics  . Alcohol use: No    Alcohol/week: 0.0 oz  . Drug use: No     Allergies   Sulfa antibiotics; Sulfonamide derivatives; Tetanus toxoid; Tetanus toxoids; Other; Nsaids; and Tetanus toxoid, adsorbed   Review of Systems Review of Systems  Constitutional: Negative for fever.  HENT: Positive for nosebleeds.   Respiratory: Negative for cough and shortness of breath.   Cardiovascular: Negative for chest pain.  Gastrointestinal: Negative for abdominal pain, nausea and vomiting.  Skin: Negative for pallor.  Neurological: Negative for  dizziness and weakness.  Hematological: Bruises/bleeds easily.  Psychiatric/Behavioral: Negative for confusion.     Physical Exam Updated Vital Signs BP (!) 120/48   Pulse 63   Temp 98.2 F (36.8 C) (Oral)   Resp 18   Ht  5\' 3"  (1.6 m)   Wt 77.1 kg (170 lb)   SpO2 98%   BMI 30.11 kg/m   Physical Exam  Constitutional: She is oriented to person, place, and time. She appears well-developed and well-nourished. No distress.  Elderly female in no apparent distress.  HENT:  Head: Normocephalic and atraumatic.  Nose: Mucosal edema present. No nose lacerations. No epistaxis.  Mouth/Throat: Uvula is midline, oropharynx is clear and moist and mucous membranes are normal.  No active bleeding.  No clots noted on exam.  Nasal mucosal edema, worse on the left side  Eyes: Pupils are equal, round, and reactive to light. Conjunctivae and EOM are normal.  Neck: Normal range of motion. Neck supple.  Cardiovascular: Normal rate, regular rhythm and intact distal pulses.  Pulmonary/Chest: Effort normal and breath sounds normal. No respiratory distress. She has no wheezes.  Clear lung sounds in all fields.  Abdominal: Soft. She exhibits no distension. There is no tenderness.  Musculoskeletal: Normal range of motion.  Neurological: She is alert and oriented to person, place, and time.  Skin: Skin is warm and dry. Capillary refill takes less than 2 seconds. No pallor.  Psychiatric: She has a normal mood and affect.  Nursing note and vitals reviewed.    ED Treatments / Results  Labs (all labs ordered are listed, but only abnormal results are displayed) Labs Reviewed  COMPREHENSIVE METABOLIC PANEL - Abnormal; Notable for the following components:      Result Value   Glucose, Bld 100 (*)    Creatinine, Ser 1.55 (*)    GFR calc non Af Amer 32 (*)    GFR calc Af Amer 37 (*)    All other components within normal limits  CBC - Abnormal; Notable for the following components:   RBC 3.56 (*)     Hemoglobin 10.6 (*)    HCT 33.9 (*)    Platelets 137 (*)    All other components within normal limits  PROTIME-INR - Abnormal; Notable for the following components:   Prothrombin Time 23.8 (*)    All other components within normal limits    EKG None  Radiology No results found.  Procedures Procedures (including critical care time)  Medications Ordered in ED Medications  oxymetazoline (AFRIN) 0.05 % nasal spray 1 spray (1 spray Each Nare Given 02/19/18 1629)     Initial Impression / Assessment and Plan / ED Course  I have reviewed the triage vital signs and the nursing notes.  Pertinent labs & imaging results that were available during my care of the patient were reviewed by me and considered in my medical decision making (see chart for details).     Patient presenting for evaluation of frequent nosebleeds on Coumadin.  Physical exam reassuring, bleeding is resolved.  Labs reassuring, she is not tachycardic or hypotensive, doubt significant volume loss.  Labs reassuring, hemoglobin stable.  Mild elevation in creatinine, 1.2-1.5, no urinary symptoms at this time.  PT/INR therapeutic.  As patient was told to come here for evaluation by Dr. Janace Hoard, will contact Dr. Janace Hoard to see if he would like any further management.  Discussed with Dr. Janace Hoard, who states that now that bleeding has resolved, there is not anything for him to do.  Will have patient follow-up in the clinic next week.  Discussed treatment with Afrin and direct pressure.  Patient encouraged not to blow her nose, as this will likely cause worsening symptoms.  Case discussed with attending, Dr. Darl Householder evaluated the patient.  At  this time, patient appears safe for discharge.  Return precautions given.  Patient states she understands and agrees to plan.  Final Clinical Impressions(s) / ED Diagnoses   Final diagnoses:  Epistaxis    ED Discharge Orders    None       Franchot Heidelberg, PA-C 02/19/18 2015    Drenda Freeze, MD 02/20/18 1459

## 2018-02-19 NOTE — ED Triage Notes (Signed)
The pt has had a nose bleed for the past 3 days.  She has a hx of the same on coumadin since her open heart surgery 7 years ago    No bleeding at presenrt

## 2018-02-25 ENCOUNTER — Other Ambulatory Visit: Payer: Self-pay | Admitting: Internal Medicine

## 2018-02-25 DIAGNOSIS — F418 Other specified anxiety disorders: Secondary | ICD-10-CM

## 2018-02-25 DIAGNOSIS — Z1239 Encounter for other screening for malignant neoplasm of breast: Secondary | ICD-10-CM

## 2018-02-26 ENCOUNTER — Other Ambulatory Visit: Payer: Self-pay | Admitting: Pulmonary Disease

## 2018-02-26 ENCOUNTER — Telehealth: Payer: Self-pay | Admitting: Pulmonary Disease

## 2018-02-26 NOTE — Telephone Encounter (Signed)
rec'd fax from [facility] for modafinil 100mg  refill for pt Placed form in VS green to do folder for review.  VS please advise when completed and signed. Thanks.

## 2018-03-01 NOTE — Telephone Encounter (Signed)
VS please advise on refill. Thanks  Last OV 12/17/17 with no pending apt.  VS please advise. Thanks

## 2018-03-03 MED ORDER — MODAFINIL 100 MG PO TABS: 100.0000 mg | ORAL_TABLET | Freq: Every day | ORAL | 1 refills | Status: DC

## 2018-03-03 NOTE — Telephone Encounter (Signed)
Called and spoke with patient regarding VS recommendations Placed order on phone with Maudie Mercury with Pharmacy today Nothing further needed.

## 2018-03-03 NOTE — Telephone Encounter (Signed)
She was last seen in May 2018.  She has appointment scheduled for 03/29/18.  Okay to send refill for 1 month supply, and further refills can be completed at time of her ROV.

## 2018-03-09 ENCOUNTER — Ambulatory Visit (INDEPENDENT_AMBULATORY_CARE_PROVIDER_SITE_OTHER): Payer: Medicare Other | Admitting: General Practice

## 2018-03-09 DIAGNOSIS — Z7901 Long term (current) use of anticoagulants: Secondary | ICD-10-CM

## 2018-03-09 DIAGNOSIS — I4892 Unspecified atrial flutter: Secondary | ICD-10-CM | POA: Diagnosis not present

## 2018-03-09 LAB — POCT INR: INR: 2.7 (ref 2.0–3.0)

## 2018-03-09 NOTE — Patient Instructions (Addendum)
Pre visit review using our clinic review tool, if applicable. No additional management support is needed unless otherwise documented below in the visit note.  Continue to take 1 tablet daily except 2 tablets on Tuesdays and Fridays.  Re-check in 6 weeks.

## 2018-03-22 NOTE — Progress Notes (Signed)
Patient ID: Holly Simmons, female   DOB: 1941/07/22, 77 y.o.   MRN: [id number]   76 y.o. female with a complex history of HOCM.  Underwent surgery at [facility] 06/23/11  She was seen in followup by Dr. Rayann Heman 11/15/11 . 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 She underwent cardioversion for atrial flutter and was placed on Coumadin and amiodarone. Epicardial leads are known to have a high threshold.   Subsequently had new pacer placed 1/15 . Could not cross TVR for stable lead but got good threshholds in CS.   PAF with previous [facility] but reversion but pacer interrogation and Dr Ky Barban took strategy of rate control and anticoagulation   Echo 03/19/17 reviewed and no change VSD looks like it has a discontinuity in it but no color flow TV no change in gradients Mild MR and mild AS  Echo 03/23/18 :  Reviewed and TV not well seen but by CW starting to thicken and get a bit stenotic With mean gradient 10 mmHg  Spoke with her son and she alternates lasix and aldactone LE edema from bad varicose veins  She is otherwise asymptomatic but sedentary Husband is in wheel chair with lots of pain from back And arthritis   ROS: Denies fever, malais, weight loss, blurry vision, decreased visual acuity, cough, sputum, SOB, hemoptysis, pleuritic pain, palpitaitons, heartburn, abdominal pain, melena, lower extremity edema, claudication, or rash.  All other systems reviewed and negative  General: BP 124/72   Pulse 89   Ht 5\' 3"  (1.6 m)   Wt 182 lb (82.6 kg)   SpO2 96%   BMI 32.24 kg/m  Affect appropriate Healthy:  appears stated age 25: normal Neck supple with no adenopathy JVP normal no bruits no thyromegaly Lungs clear with no wheezing and good diaphragmatic motion Heart:  S1/S2 SEM  murmur, no rub, gallop  or click PMI normal Abdomen: benighn, BS positve, no tenderness, no AAA no bruit.  No HSM or HJR Distal pulses intact with no bruits Plus one bilateral  Edema with bad varicose veins bilaterally Neuro non-focal Skin warm and dry No muscular weakness    Current Outpatient Medications  Medication Sig Dispense Refill  . allopurinol (ZYLOPRIM) 100 MG tablet TAKE 1 TABLET BY MOUTH DAILY. (Patient taking differently: TAKE 100mg  TABLET BY MOUTH DAILY.) 90 tablet 1  . arformoterol (BROVANA) 15 MCG/2ML NEBU Take 2 mLs (15 mcg total) by nebulization 2 (two) times daily. 120 mL 0  . B Complex-C (B-COMPLEX WITH VITAMIN C) tablet Take 1 tablet by mouth daily.    . Betamethasone Valerate 0.12 % foam APPLY TO SCALP QD (Patient taking differently: Apply 1 application topically once a week. Apply to scalp.) 100 g 2  . budesonide (PULMICORT) 0.25 MG/2ML nebulizer solution Take 2 mLs (0.25 mg total) by nebulization 2 (two) times daily. 60 mL 0  . buPROPion (WELLBUTRIN XL) 150 MG 24 hr tablet TAKE 1 TABLET ONCE DAILY. (Patient taking differently: TAKE 150mg  TABLET ONCE DAILY.) 90 tablet 1  . Cholecalciferol 2000 UNITS TABS Take 1 tablet (2,000 Units total) by mouth daily. 90 tablet 3  . ciclopirox (LOPROX) 0.77 % SUSP APPLY 1 PUMP TWICE A DAY. 60 mL 0  . ciclopirox (LOPROX) 0.77 % SUSP APPLY 1 PUMP TWICE A DAY. 60 mL 3  . fexofenadine (ALLEGRA)  180 MG tablet Take 180 mg by mouth daily as needed for allergies.     . furosemide (LASIX) 40 MG tablet TAKE 1 TABLET ONCE DAILY. (Patient taking differently: TAKE 40mg  ONCE DAILY.) 90 tablet 1  . ipratropium (ATROVENT) 0.03 % nasal spray USE 2 SPRAYS IN EACH NOSTRIL 3 TIMES A DAY AS NEEDED. 30 mL 0  . lamoTRIgine (LAMICTAL) 25 MG tablet Take 1 tablet (25 mg total) by mouth daily. 90 tablet 0  . metoprolol tartrate (LOPRESSOR) 25 MG tablet TAKE 1 TABLET BY MOUTH TWICE DAILY. (Patient taking differently: TAKE 25mg  BY MOUTH TWICE DAILY.) 180 tablet 0  . modafinil  (PROVIGIL) 100 MG tablet Take 1 tablet (100 mg total) by mouth daily. 30 tablet 1  . PRESCRIPTION MEDICATION CPAP    . ranitidine (ZANTAC) 300 MG tablet TAKE ONE TABLET AT BEDTIME. (Patient taking differently: TAKE 300mg  AT BEDTIME.) 90 tablet 1  . SYMBICORT 80-4.5 MCG/ACT inhaler USE 2 PUFFS TWICE A DAY. (Patient taking differently: INHALE 2 PUFFS TWICE A DAY.) 10.2 g 5  . TRADJENTA 5 MG TABS tablet TAKE 1 TABLET BY MOUTH DAILY. (Patient taking differently: TAKE 5mg  BY MOUTH DAILY.) 90 tablet 0  . VIIBRYD 40 MG TABS TAKE 1 TABLET ONCE DAILY. (Patient taking differently: TAKE 40mg  BY MOUTH DAILY.) 90 tablet 1  . warfarin (COUMADIN) 2.5 MG tablet TAKE AS DIRECTED PER COUMADIN CLINIC. (Patient taking differently: Take 2.5-5 mg by mouth See admin instructions. Take 2.5mg  by mouth daily on Monday, Wednesday, Thursday, Saturday & Sunday , Then take 5mg  by mouth daily on Tuesdays & Fridays) 40 tablet 3   No current facility-administered medications for this visit.     Allergies  Sulfa antibiotics; Sulfonamide derivatives; Tetanus toxoid; Tetanus toxoids; Other; Nsaids; and Tetanus toxoid, adsorbed  Electrocardiogram:  AV paced rate 79  8/15 12/23  Flutter rate 85  RBBB paced   Flutter with V pacing rate 60  RBBB pattern 03/26/16   Assessment and Plan  HOCM: post myectomy with complication LVOT gradient gone now VSD sealed post MI/myectomy complication Pacer  Thresholds ok f/u [facility] ? Change to VVIR mode  TV replacement  Mean gradient gone from 7-10 mmHg follow TTE in a year  Anticoagulation  For PAF  No bleeding issues f/u coumadin clinic Afib:  Seen by Dr Rayann Heman  09/26/15 pacer indicated been in afib since October 2016 decided To rate control and anticoagulate since patient is unaware of arrhythmia.  Given valvular afib on coumadin and not NOAC   Jenkins Rouge

## 2018-03-23 ENCOUNTER — Other Ambulatory Visit: Payer: Self-pay

## 2018-03-23 ENCOUNTER — Ambulatory Visit: Payer: Medicare Other | Admitting: Cardiovascular Disease

## 2018-03-23 ENCOUNTER — Telehealth: Payer: Self-pay | Admitting: Pulmonary Disease

## 2018-03-23 ENCOUNTER — Encounter: Payer: Self-pay | Admitting: Cardiovascular Disease

## 2018-03-23 ENCOUNTER — Ambulatory Visit (HOSPITAL_COMMUNITY): Payer: Medicare Other | Attending: Cardiovascular Disease

## 2018-03-23 VITALS — BP 124/72 | HR 89 | Ht 63.0 in | Wt 182.0 lb

## 2018-03-23 DIAGNOSIS — Z952 Presence of prosthetic heart valve: Secondary | ICD-10-CM | POA: Diagnosis not present

## 2018-03-23 DIAGNOSIS — J449 Chronic obstructive pulmonary disease, unspecified: Secondary | ICD-10-CM | POA: Insufficient documentation

## 2018-03-23 DIAGNOSIS — I110 Hypertensive heart disease with heart failure: Secondary | ICD-10-CM | POA: Diagnosis not present

## 2018-03-23 DIAGNOSIS — E119 Type 2 diabetes mellitus without complications: Secondary | ICD-10-CM | POA: Insufficient documentation

## 2018-03-23 DIAGNOSIS — I421 Obstructive hypertrophic cardiomyopathy: Secondary | ICD-10-CM

## 2018-03-23 DIAGNOSIS — I4891 Unspecified atrial fibrillation: Secondary | ICD-10-CM | POA: Diagnosis not present

## 2018-03-23 DIAGNOSIS — I509 Heart failure, unspecified: Secondary | ICD-10-CM | POA: Diagnosis not present

## 2018-03-23 DIAGNOSIS — I442 Atrioventricular block, complete: Secondary | ICD-10-CM | POA: Diagnosis not present

## 2018-03-23 DIAGNOSIS — I4892 Unspecified atrial flutter: Secondary | ICD-10-CM | POA: Insufficient documentation

## 2018-03-23 DIAGNOSIS — E669 Obesity, unspecified: Secondary | ICD-10-CM | POA: Insufficient documentation

## 2018-03-23 DIAGNOSIS — Z6830 Body mass index (BMI) 30.0-30.9, adult: Secondary | ICD-10-CM | POA: Insufficient documentation

## 2018-03-23 DIAGNOSIS — I361 Nonrheumatic tricuspid (valve) insufficiency: Secondary | ICD-10-CM

## 2018-03-23 DIAGNOSIS — Q210 Ventricular septal defect: Secondary | ICD-10-CM | POA: Diagnosis not present

## 2018-03-23 DIAGNOSIS — E785 Hyperlipidemia, unspecified: Secondary | ICD-10-CM | POA: Diagnosis not present

## 2018-03-23 NOTE — Telephone Encounter (Signed)
PA started via [facility]. KEY: [id number]  PA approved 03/23/2018 through 09/23/2018.  Left message for patient regarding approval.   Faxed approval information to [facility].

## 2018-03-23 NOTE — Patient Instructions (Addendum)
Medication Instructions:  Your physician recommends that you continue on your current medications as directed. Please refer to the Current Medication list given to you today.  Labwork: NONE  Testing/Procedures: Your physician has requested that you have an echocardiogram in one year. Echocardiography is a painless test that uses sound waves to create images of your heart. It provides your doctor with information about the size and shape of your heart and how well your heart's chambers and valves are working. This procedure takes approximately one hour. There are no restrictions for this procedure.  Follow-Up: Your physician wants you to follow-up in: 12  months with Dr. Nishan. You will receive a reminder letter in the mail two months in advance. If you don't receive a letter, please call our office to schedule the follow-up appointment.   If you need a refill on your cardiac medications before your next appointment, please call your pharmacy.    

## 2018-03-29 ENCOUNTER — Ambulatory Visit: Payer: Medicare Other | Admitting: Pulmonary Disease

## 2018-03-29 ENCOUNTER — Encounter: Payer: Self-pay | Admitting: Pulmonary Disease

## 2018-03-29 ENCOUNTER — Other Ambulatory Visit: Payer: Self-pay | Admitting: Internal Medicine

## 2018-03-29 VITALS — BP 120/70 | HR 68 | Ht 64.5 in | Wt 181.8 lb

## 2018-03-29 DIAGNOSIS — J300 Vasomotor rhinitis: Secondary | ICD-10-CM | POA: Diagnosis not present

## 2018-03-29 DIAGNOSIS — G4733 Obstructive sleep apnea (adult) (pediatric): Secondary | ICD-10-CM | POA: Diagnosis not present

## 2018-03-29 DIAGNOSIS — R040 Epistaxis: Secondary | ICD-10-CM | POA: Diagnosis not present

## 2018-03-29 DIAGNOSIS — R400 Somnolence: Secondary | ICD-10-CM | POA: Diagnosis not present

## 2018-03-29 MED ORDER — MODAFINIL 100 MG PO TABS: 100.0000 mg | ORAL_TABLET | Freq: Every day | ORAL | 5 refills | Status: DC

## 2018-03-29 NOTE — Patient Instructions (Addendum)
Will change your CPAP setting to 8 cm H2O Call if you feel the pressure setting is too low after resetting the pressure on your machine Call if you need help getting an appointment with an ENT surgeon Follow up in 1 year

## 2018-03-29 NOTE — Progress Notes (Signed)
[facility]  Chief Complaint  Patient presents with  . Follow-up    sleep well, having nosebleeds    Constitutional: BP 120/70 (BP Location: Left Arm, Patient Position: Sitting, Cuff Size: Normal)   Pulse 68   Ht 5' 4.5" (1.638 m)   Wt 181 lb 12.8 oz (82.5 kg)   SpO2 95%   BMI 30.72 kg/m   History of Present Illness: Holly Simmons is a 77 y.o. female with obstructive sleep apnea and residual daytime sleepiness, and vasomotor rhinitis.  She has been getting frequent nose bleeds.  These happen about twice per week.  Always from Lt nares.  She was to have appointment with ENT in ER.  Apparently there was a mix up with ER staff, and she didn't seen ENT.  She is not having sinus pressure, headache, sore throat, or dry mouth.  She uses atrovent for runny nose and this helps.  Again, her nose runs on the left side.  She has a full face mask.  Gets occasional mask leak.  Feels pressure is okay.  She has trouble using CPAP when she gets nose bleed.  Otherwise CPAP helps her sleep and energy level.  She uses symbicort prn when she has cough and chest congestion.  Her breathing is okay at present.  She still gets sleepy during the day.  She definitely takes provigil in the morning on Monday and Wednesday when she has to drive.  She notices have more trouble staying awake on other days if she doesn't take provigil.   Comprehensive Respiratory Exam:  Appearance - well kempt  ENMT - nasal mucosa moist, turbinates clear, midline nasal septum, area of erosion and crusted blood in Lt nares, no dental lesions, no gingival bleeding, no oral exudates, no tonsillar hypertrophy Neck - no masses, trachea midline, no thyromegaly, no elevation in JVP Respiratory - normal appearance of chest wall, normal respiratory effort w/o accessory muscle use, no dullness on percussion, no wheezing or rales CV - s1s2 regular rate and rhythm, no murmurs, no peripheral  edema, extensive varicosities on lower legs b/l, radial pulses symmetric GI - soft, non tender, no masses Lymph - no adenopathy noted in neck and axillary areas MSK - normal muscle strength and tone, normal gait Ext - no cyanosis, clubbing, or joint inflammation noted Skin - no rashes, lesions, or ulcers Neuro - oriented to person, place, and time Psych - normal mood and affect   CXR 11/17/17 (reviewed by me) >> cardiomegaly   CMP Latest Ref Rng & Units 02/19/2018 11/17/2017 05/11/2017  Glucose 70 - 99 mg/dL 100(H) 117(H) 198(H)  BUN 8 - 23 mg/dL 20 17 25(H)  Creatinine 0.44 - 1.00 mg/dL 1.55(H) 1.13 1.05  Sodium 135 - 145 mmol/L 138 139 135  Potassium 3.5 - 5.1 mmol/L 4.2 3.9 4.3  Chloride 98 - 111 mmol/L 102 102 99  CO2 22 - 32 mmol/L 27 31 30   Calcium 8.9 - 10.3 mg/dL 9.7 9.4 10.0  Total Protein 6.5 - 8.1 g/dL 6.9 - -  Total Bilirubin 0.3 - 1.2 mg/dL 0.9 - -  Alkaline Phos 38 - 126 U/L 120 - -  AST 15 - 41 U/L 24 - -  ALT 0 - 44 U/L 11 - -    CBC Latest Ref Rng & Units 02/19/2018 06/01/2017 05/11/2017  WBC 4.0 - 10.5 K/uL 6.6 6.7 12.1(H)  Hemoglobin 12.0 - 15.0 g/dL 10.6(L) 12.1 11.6(L)  Hematocrit 36.0 - 46.0 % 33.9(L) 37.4  35.8(L)  Platelets 150 - 400 K/uL 137(L) 212.0 309.0    Assessment/Plan:  Epistaxis. - will see if lower CPAP pressure helps reduce nasal irritation - she will call to schedule ENT appointment >> advised her to call here if she has trouble getting appointment  Obstructive sleep apnea. 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 - discussed importance of maintaining compliance with CPAP - continue provigil 100 mg prn in the morning  Vasomotor rhinitis. - prn atrovent nasal spray - advised her to discuss this also with ENT   Patient Instructions  Will change your CPAP setting to 8 cm H2O Call if you feel the pressure setting is too  low after resetting the pressure on your machine Call if you need help getting an appointment with an ENT surgeon Follow up in 1 year    [name], MD [place] 03/29/2018, 10:55 AM  Flow Sheet  Sleep tests: PSG 06/05/07 >> AHI 6, SpO2 low 77% HST 05/15/15 >> AHI 29, SaO2 low 70% Auto CPAP 02/27/18 to 03/28/18 >> used on 20 of 30 nights with average 6 hrs 10 min.  Average AHI 0.7 with median CPAP 7 and 95 th percentile CPAP 10 cm H2O  Cardiac tests Echo 03/23/18 >> EF 55%, s/p VSD patch, mild AS and AR, mild MR, PAS 58 m  Past Medical History: She  has a past medical history of Allergic rhinitis, Asthma, Blood transfusion without reported diagnosis, Colon polyps, Complete heart block ([place]), COPD (chronic obstructive pulmonary disease) ([place]), Depressive disorder, Diastolic dysfunction, DM (diabetes mellitus) ([place]), Gallstones, GERD (gastroesophageal reflux disease), Gout (08/20/2012), Heart murmur, Hyperpotassemia, Hypersomnia, Hypertension, Hypertrophic cardiomyopathy ([place]), Kidney stones, Myocardial infarction ([place]) (2011), Obstructive sleep apnea, Pacemaker (07/13/2012), Psoriasis, Pyuria, Renal insufficiency, Tricuspid valve replaced, and Typical atrial flutter ([place]) (9/15).  Past Surgical History: She  has a past surgical history that includes Appendectomy; Cholecystectomy; Tonsillectomy; Cesarean section; Abdominal hysterectomy; septal myomectomy for hypertrophic CM (06/23/11); Tricuspid valve replacement (11/12); VSD repair (06/2011); Pacemaker insertion (07/02/11); Insert / replace / remove pacemaker (07/13/2012); Colonoscopy; Polypectomy; permanent pacemaker insertion (N/A, 07/13/2012); Cardioversion (N/A, 08/01/2014); Eye surgery ([id number]); Eye surgery ([id number]); and Breast cyst excision.  Family History: Her family history includes Bladder Cancer in her father; Breast cancer in her mother and paternal aunt; Cancer in her father and mother; Dementia in her  maternal aunt, maternal uncle, and mother; Heart attack in her father; Heart disease in her maternal grandfather and paternal grandfather; Hypertension in her daughter and mother; Ovarian cancer in her maternal aunt; Pancreatic cancer in her cousin; Prostate cancer in her father; Varicose Veins in her father.  Social History: She  reports that she has never smoked. She has never used smokeless tobacco. She reports that she does not drink alcohol or use drugs.  Medications: Allergies as of 03/29/2018      Reactions   Sulfa Antibiotics Rash   Sulfonamide Derivatives Anaphylaxis, Swelling   Tetanus Toxoid    Other reaction(s): Other (See Comments) OTHER REACTION   Tetanus Toxoids Swelling   Other Rash   STERI - STRIPS    Nsaids Other (See Comments)   REACTION: proteinuria Other reaction(s): Other (See Comments), Unknown It can affect proteins in her kidneys so she doesn't take   Tetanus Toxoid, Adsorbed    Other reaction(s): Other (See Comments) Turned arm red      Medication List  Accurate as of 03/29/18 10:55 AM. Always use your most recent med list.          allopurinol 100 MG tablet Commonly known as:  ZYLOPRIM TAKE 1 TABLET BY MOUTH DAILY.   arformoterol 15 MCG/2ML Nebu Commonly known as:  BROVANA Take 2 mLs (15 mcg total) by nebulization 2 (two) times daily.   B-complex with vitamin C tablet Take 1 tablet by mouth daily.   Betamethasone Valerate 0.12 % foam APPLY TO SCALP QD   budesonide 0.25 MG/2ML nebulizer solution Commonly known as:  PULMICORT Take 2 mLs (0.25 mg total) by nebulization 2 (two) times daily.   buPROPion 150 MG 24 hr tablet Commonly known as:  WELLBUTRIN XL TAKE 1 TABLET ONCE DAILY.   Cholecalciferol 2000 units Tabs Take 1 tablet (2,000 Units total) by mouth daily.   ciclopirox 0.77 % Susp Commonly known as:  LOPROX APPLY 1 PUMP TWICE A DAY.   ciclopirox 0.77 % Susp Commonly known as:  LOPROX APPLY 1 PUMP TWICE A DAY.     fexofenadine 180 MG tablet Commonly known as:  ALLEGRA Take 180 mg by mouth daily as needed for allergies.   furosemide 40 MG tablet Commonly known as:  LASIX TAKE 1 TABLET ONCE DAILY.   ipratropium 0.03 % nasal spray Commonly known as:  ATROVENT USE 2 SPRAYS IN EACH NOSTRIL 3 TIMES A DAY AS NEEDED.   lamoTRIgine 25 MG tablet Commonly known as:  LAMICTAL Take 1 tablet (25 mg total) by mouth daily.   linagliptin 5 MG Tabs tablet Commonly known as:  TRADJENTA TAKE 5mg  BY MOUTH DAILY.   metoprolol tartrate 25 MG tablet Commonly known as:  LOPRESSOR TAKE 1 TABLET BY MOUTH TWICE DAILY.   modafinil 100 MG tablet Commonly known as:  PROVIGIL Take 1 tablet (100 mg total) by mouth daily.   PRESCRIPTION MEDICATION CPAP   ranitidine 300 MG tablet Commonly known as:  ZANTAC TAKE ONE TABLET AT BEDTIME.   SYMBICORT 80-4.5 MCG/ACT inhaler Generic drug:  budesonide-formoterol USE 2 PUFFS TWICE A DAY.   Vilazodone HCl 40 MG Tabs Commonly known as:  VIIBRYD TAKE 40mg  BY MOUTH DAILY.   warfarin 2.5 MG tablet Commonly known as:  COUMADIN Take as directed by the anticoagulation clinic. If you are unsure how to take this medication, talk to your nurse or doctor. Original instructions:  TAKE AS DIRECTED PER COUMADIN CLINIC.

## 2018-03-30 ENCOUNTER — Ambulatory Visit (INDEPENDENT_AMBULATORY_CARE_PROVIDER_SITE_OTHER): Payer: Medicare Other | Admitting: *Deleted

## 2018-03-30 DIAGNOSIS — I442 Atrioventricular block, complete: Secondary | ICD-10-CM | POA: Diagnosis not present

## 2018-03-30 NOTE — Progress Notes (Signed)
Remote pacemaker transmission.   

## 2018-04-01 ENCOUNTER — Encounter: Payer: Self-pay | Admitting: Cardiology

## 2018-04-16 ENCOUNTER — Telehealth: Payer: Self-pay | Admitting: Internal Medicine

## 2018-04-16 NOTE — Telephone Encounter (Signed)
Insurance has been submitted and verified for Prolia. Patient is responsible for a $70 copay. Due on or after 07/23/2018. Patient states that she will schedule this when she is here in the office on Tuesday.  Okay to schedule... Visit Note: Prolia ($70 copay - okay to give per Gareth Eagle) Visit Type: Nurse Provider: Nurse

## 2018-04-19 ENCOUNTER — Ambulatory Visit
Admission: RE | Admit: 2018-04-19 | Discharge: 2018-04-19 | Disposition: A | Discharge status: Home / Self Care | Payer: Medicare Other | Source: Ambulatory Visit | Attending: Internal Medicine | Admitting: Internal Medicine

## 2018-04-19 DIAGNOSIS — Z1239 Encounter for other screening for malignant neoplasm of breast: Secondary | ICD-10-CM

## 2018-04-19 LAB — HM MAMMOGRAPHY

## 2018-04-20 ENCOUNTER — Ambulatory Visit (INDEPENDENT_AMBULATORY_CARE_PROVIDER_SITE_OTHER): Payer: Medicare Other | Admitting: General Practice

## 2018-04-20 ENCOUNTER — Encounter: Payer: Self-pay | Admitting: Internal Medicine

## 2018-04-20 ENCOUNTER — Other Ambulatory Visit (INDEPENDENT_AMBULATORY_CARE_PROVIDER_SITE_OTHER): Payer: Medicare Other

## 2018-04-20 ENCOUNTER — Ambulatory Visit: Payer: Medicare Other | Admitting: Internal Medicine

## 2018-04-20 VITALS — BP 130/68 | HR 77 | Temp 98.3°F | Resp 16 | Ht 64.5 in | Wt 183.5 lb

## 2018-04-20 DIAGNOSIS — E1129 Type 2 diabetes mellitus with other diabetic kidney complication: Secondary | ICD-10-CM | POA: Diagnosis not present

## 2018-04-20 DIAGNOSIS — Z7901 Long term (current) use of anticoagulants: Secondary | ICD-10-CM

## 2018-04-20 DIAGNOSIS — E118 Type 2 diabetes mellitus with unspecified complications: Secondary | ICD-10-CM

## 2018-04-20 DIAGNOSIS — D539 Nutritional anemia, unspecified: Secondary | ICD-10-CM

## 2018-04-20 DIAGNOSIS — Z23 Encounter for immunization: Secondary | ICD-10-CM | POA: Diagnosis not present

## 2018-04-20 DIAGNOSIS — I4892 Unspecified atrial flutter: Secondary | ICD-10-CM

## 2018-04-20 DIAGNOSIS — I5032 Chronic diastolic (congestive) heart failure: Secondary | ICD-10-CM

## 2018-04-20 DIAGNOSIS — R809 Proteinuria, unspecified: Secondary | ICD-10-CM

## 2018-04-20 LAB — CUP PACEART REMOTE DEVICE CHECK
Battery Remaining Longevity: 91 mo
Battery Voltage: 2.78 V
Brady Statistic AS VS Percent: 1 %
Date Time Interrogation Session: 20190827142435
Implantable Lead Implant Date: 20131210
Implantable Lead Implant Date: 20131210
Implantable Lead Location: 753858
Implantable Lead Location: 753859
Implantable Lead Model: 5076
Implantable Pulse Generator Implant Date: 20131210
Lead Channel Impedance Value: 447 Ohm
Lead Channel Impedance Value: 842 Ohm
Lead Channel Pacing Threshold Amplitude: 0.875 V
Lead Channel Pacing Threshold Pulse Width: 0.4 ms
Lead Channel Setting Pacing Amplitude: 2 V
Lead Channel Setting Pacing Pulse Width: 0.4 ms
Lead Channel Setting Sensing Sensitivity: 8 mV
MDC IDC MSMT BATTERY IMPEDANCE: 407 Ohm
MDC IDC MSMT LEADCHNL RA PACING THRESHOLD PULSEWIDTH: 0.4 ms
MDC IDC MSMT LEADCHNL RV PACING THRESHOLD AMPLITUDE: 0.75 V
MDC IDC SET LEADCHNL RV PACING AMPLITUDE: 2.5 V
MDC IDC STAT BRADY AP VP PERCENT: 36 %
MDC IDC STAT BRADY AP VS PERCENT: 0 %
MDC IDC STAT BRADY AS VP PERCENT: 64 %

## 2018-04-20 LAB — CBC WITH DIFFERENTIAL/PLATELET
Basophils Absolute: 0 10*3/uL (ref 0.0–0.1)
Basophils Relative: 0.5 % (ref 0.0–3.0)
EOS PCT: 3.5 % (ref 0.0–5.0)
Eosinophils Absolute: 0.2 10*3/uL (ref 0.0–0.7)
HCT: 31.9 % — ABNORMAL LOW (ref 36.0–46.0)
Hemoglobin: 10.5 g/dL — ABNORMAL LOW (ref 12.0–15.0)
LYMPHS ABS: 0.8 10*3/uL (ref 0.7–4.0)
Lymphocytes Relative: 12.4 % (ref 12.0–46.0)
MCHC: 33 g/dL (ref 30.0–36.0)
MCV: 90.9 fl (ref 78.0–100.0)
MONO ABS: 0.6 10*3/uL (ref 0.1–1.0)
Monocytes Relative: 10.2 % (ref 3.0–12.0)
NEUTROS ABS: 4.6 10*3/uL (ref 1.4–7.7)
NEUTROS PCT: 73.4 % (ref 43.0–77.0)
PLATELETS: 140 10*3/uL — AB (ref 150.0–400.0)
RBC: 3.51 Mil/uL — AB (ref 3.87–5.11)
RDW: 16 % — AB (ref 11.5–15.5)
WBC: 6.3 10*3/uL (ref 4.0–10.5)

## 2018-04-20 LAB — BASIC METABOLIC PANEL
BUN: 23 mg/dL (ref 6–23)
CO2: 28 meq/L (ref 19–32)
Calcium: 9.8 mg/dL (ref 8.4–10.5)
Chloride: 102 mEq/L (ref 96–112)
Creatinine, Ser: 1.35 mg/dL — ABNORMAL HIGH (ref 0.40–1.20)
GFR: 40.66 mL/min — ABNORMAL LOW (ref >60.00)
GLUCOSE: 117 mg/dL — AB (ref 70–99)
POTASSIUM: 4 meq/L (ref 3.5–5.1)
Sodium: 139 mEq/L (ref 135–145)

## 2018-04-20 LAB — URINALYSIS, ROUTINE W REFLEX MICROSCOPIC
Bilirubin Urine: NEGATIVE
Ketones, ur: NEGATIVE
LEUKOCYTES UA: NEGATIVE
Nitrite: NEGATIVE
PH: 6 (ref 5.0–8.0)
TOTAL PROTEIN, URINE-UPE24: 100 — AB
Urine Glucose: NEGATIVE
Urobilinogen, UA: 0.2 (ref 0.0–1.0)

## 2018-04-20 LAB — IBC PANEL
Iron: 50 ug/dL (ref 42–145)
SATURATION RATIOS: 12.4 % — AB (ref 20.0–50.0)
TRANSFERRIN: 288 mg/dL (ref 212.0–360.0)

## 2018-04-20 LAB — TSH: TSH: 1.77 u[IU]/mL (ref 0.35–4.50)

## 2018-04-20 LAB — MICROALBUMIN / CREATININE URINE RATIO
CREATININE, U: 190.9 mg/dL
MICROALB UR: 63.9 mg/dL — AB (ref 0.0–1.9)
MICROALB/CREAT RATIO: 33.5 mg/g — AB (ref 0.0–30.0)

## 2018-04-20 LAB — FOLATE

## 2018-04-20 LAB — POCT INR: INR: 2.6 (ref 2.0–3.0)

## 2018-04-20 LAB — HEMOGLOBIN A1C: Hgb A1c MFr Bld: 6.2 % (ref 4.6–6.5)

## 2018-04-20 LAB — VITAMIN B12: Vitamin B-12: 1057 pg/mL — ABNORMAL HIGH (ref 211–911)

## 2018-04-20 LAB — FERRITIN: Ferritin: 20.2 ng/mL (ref 10.0–291.0)

## 2018-04-20 NOTE — Patient Instructions (Addendum)
Pre visit review using our clinic review tool, if applicable. No additional management support is needed unless otherwise documented below in the visit note.  Continue to take 1 tablet daily except 2 tablets on Tuesdays and Fridays.  Re-check in 6 weeks.

## 2018-04-20 NOTE — Telephone Encounter (Signed)
Patient advised during an office visit

## 2018-04-20 NOTE — Progress Notes (Signed)
Subjective:  Patient ID: Holly Simmons, female    DOB: [birth year]  Age: 77 y.o. MRN: [id number]  CC: Anemia; Diabetes; and Congestive Heart Failure   HPI [name] presents for f/up - She has her usual complaints of rare nosebleeds and occasional episodes and bruising on her arms.  She denies any recent episodes of blood in her urine or blood in her stool.  She recently had lab work done elsewhere and was found to be mildly anemic again.  She is taking a B12 complex supplement.  She does not think she is taking any other vitamin supplements.  She complains of chronic fatigue but denies any recent episodes of chest pain, shortness of breath, paresthesias, edema, dizziness, lightheadedness, or near syncope.  Outpatient Medications Prior to Visit  Medication Sig Dispense Refill  . allopurinol (ZYLOPRIM) 100 MG tablet TAKE 1 TABLET BY MOUTH DAILY. (Patient taking differently: TAKE 100mg  TABLET BY MOUTH DAILY.) 90 tablet 1  . arformoterol (BROVANA) 15 MCG/2ML NEBU Take 2 mLs (15 mcg total) by nebulization 2 (two) times daily. 120 mL 0  . B Complex-C (B-COMPLEX WITH VITAMIN C) tablet Take 1 tablet by mouth daily.    . Betamethasone Valerate 0.12 % foam APPLY TO SCALP QD (Patient taking differently: Apply 1 application topically once a week. Apply to scalp.) 100 g 2  . budesonide (PULMICORT) 0.25 MG/2ML nebulizer solution Take 2 mLs (0.25 mg total) by nebulization 2 (two) times daily. 60 mL 0  . buPROPion (WELLBUTRIN XL) 150 MG 24 hr tablet TAKE 1 TABLET ONCE DAILY. (Patient taking differently: TAKE 150mg  TABLET ONCE DAILY.) 90 tablet 1  . Cholecalciferol 2000 UNITS TABS Take 1 tablet (2,000 Units total) by mouth daily. 90 tablet 3  . ciclopirox (LOPROX) 0.77 % SUSP APPLY 1 PUMP TWICE A DAY. 60 mL 3  . fexofenadine (ALLEGRA) 180 MG tablet Take 180 mg by mouth daily as needed for allergies.     . furosemide (LASIX) 40 MG tablet TAKE 1 TABLET ONCE DAILY. (Patient taking differently:  TAKE 40mg  ONCE DAILY.) 90 tablet 1  . ipratropium (ATROVENT) 0.03 % nasal spray USE 2 SPRAYS IN EACH NOSTRIL 3 TIMES A DAY AS NEEDED. 30 mL 0  . lamoTRIgine (LAMICTAL) 25 MG tablet Take 1 tablet (25 mg total) by mouth daily. 90 tablet 0  . linagliptin (TRADJENTA) 5 MG TABS tablet TAKE 5mg  BY MOUTH DAILY. 90 tablet 0  . metoprolol tartrate (LOPRESSOR) 25 MG tablet TAKE 1 TABLET BY MOUTH TWICE DAILY. (Patient taking differently: TAKE 25mg  BY MOUTH TWICE DAILY.) 180 tablet 0  . modafinil (PROVIGIL) 100 MG tablet Take 1 tablet (100 mg total) by mouth daily. 30 tablet 5  . PRESCRIPTION MEDICATION CPAP    . ranitidine (ZANTAC) 300 MG tablet TAKE ONE TABLET AT BEDTIME. (Patient taking differently: TAKE 300mg  AT BEDTIME.) 90 tablet 1  . SYMBICORT 80-4.5 MCG/ACT inhaler USE 2 PUFFS TWICE A DAY. (Patient taking differently: INHALE 2 PUFFS TWICE A DAY.) 10.2 g 5  . Vilazodone HCl (VIIBRYD) 40 MG TABS TAKE 40mg  BY MOUTH DAILY. 90 tablet 1  . warfarin (COUMADIN) 2.5 MG tablet TAKE AS DIRECTED PER COUMADIN CLINIC. (Patient taking differently: Take 2.5-5 mg by mouth See admin instructions. Take 2.5mg  by mouth daily on Monday, Wednesday, Thursday, Saturday & Sunday , Then take 5mg  by mouth daily on Tuesdays & Fridays) 40 tablet 3  . ciclopirox (LOPROX) 0.77 % SUSP APPLY 1 PUMP TWICE A DAY. 60 mL 0   No  facility-administered medications prior to visit.     ROS Review of Systems  Constitutional: Positive for fatigue. Negative for appetite change, diaphoresis and unexpected weight change.  HENT: Negative.   Eyes: Negative for visual disturbance.  Respiratory: Negative for cough, chest tightness, shortness of breath and wheezing.   Cardiovascular: Negative for chest pain, palpitations and leg swelling.  Gastrointestinal: Negative for abdominal pain, anal bleeding, blood in stool, diarrhea, nausea and vomiting.  Endocrine: Negative for polydipsia, polyphagia and polyuria.  Genitourinary: Negative.  Negative for  difficulty urinating, dysuria, flank pain and hematuria.  Musculoskeletal: Negative.  Negative for back pain and myalgias.  Skin: Negative for color change, pallor and rash.  Neurological: Negative for dizziness, weakness and light-headedness.  Hematological: Negative for adenopathy. Does not bruise/bleed easily.  Psychiatric/Behavioral: Negative.     Objective:  BP 130/68 (BP Location: Left Arm, Patient Position: Sitting, Cuff Size: Normal)   Pulse 77   Temp 98.3 F (36.8 C) (Oral)   Resp 16   Ht 5' 4.5" (1.638 m)   Wt 183 lb 8 oz (83.2 kg)   SpO2 91%   BMI 31.01 kg/m   BP Readings from Last 3 Encounters:  04/20/18 130/68  03/29/18 120/70  03/23/18 124/72    Wt Readings from Last 3 Encounters:  04/20/18 183 lb 8 oz (83.2 kg)  03/29/18 181 lb 12.8 oz (82.5 kg)  03/23/18 182 lb (82.6 kg)    Physical Exam  Constitutional: She is oriented to person, place, and time. No distress.  HENT:  Mouth/Throat: Oropharynx is clear and moist. No oropharyngeal exudate.  Eyes: Conjunctivae are normal. No scleral icterus.  Neck: Normal range of motion. Neck supple. No JVD present. No thyromegaly present.  Cardiovascular: Normal rate. Exam reveals no gallop and no friction rub.  Murmur heard.  Systolic murmur is present with a grade of 1/6.  Diastolic murmur is present with a grade of 2/6. Pulmonary/Chest: Effort normal and breath sounds normal. No respiratory distress. She has no wheezes. She has no rales.  Abdominal: Soft. Bowel sounds are normal. She exhibits no mass. There is no tenderness.  Musculoskeletal: Normal range of motion. She exhibits no edema, tenderness or deformity.  Lymphadenopathy:    She has no cervical adenopathy.  Neurological: She is alert and oriented to person, place, and time.  Skin: Skin is warm and dry. She is not diaphoretic. No pallor.  Vitals reviewed.   Lab Results  Component Value Date   WBC 6.3 04/20/2018   HGB 10.5 (L) 04/20/2018   HCT 31.9 (L)  04/20/2018   PLT 140.0 (L) 04/20/2018   GLUCOSE 117 (H) 04/20/2018   CHOL 125 11/17/2017   TRIG 73.0 11/17/2017   HDL 48.80 11/17/2017   LDLCALC 62 11/17/2017   ALT 11 02/19/2018   AST 24 02/19/2018   NA 139 04/20/2018   K 4.0 04/20/2018   CL 102 04/20/2018   CREATININE 1.35 (H) 04/20/2018   BUN 23 04/20/2018   CO2 28 04/20/2018   TSH 1.77 04/20/2018   INR 2.6 04/20/2018   HGBA1C 6.2 04/20/2018   MICROALBUR 63.9 (H) 04/20/2018    Mm 3d Screen Breast Bilateral  Result Date: 04/19/2018 CLINICAL DATA:  Screening. EXAM: DIGITAL SCREENING BILATERAL MAMMOGRAM WITH TOMO AND CAD COMPARISON:  Previous exam(s). ACR Breast Density Category b: There are scattered areas of fibroglandular density. FINDINGS: There are no findings suspicious for malignancy. Images were processed with CAD. IMPRESSION: No mammographic evidence of malignancy. A result letter of this screening mammogram  will be mailed directly to the patient. RECOMMENDATION: Screening mammogram in one year. (Code:SM-B-01Y) BI-RADS CATEGORY  1: Negative. Electronically Signed   By: Margarette Canada M.D.   On: 04/19/2018 16:10    Assessment & Plan:   Correna was seen today for anemia, diabetes and congestive heart failure.  Diagnoses and all orders for this visit:  Type 2 diabetes mellitus with complication, without long-term current use of insulin (HCC)-her A1c is at 6.2%.  Her blood sugars are adequately well controlled.  We will continue the DPP 4 inhibitor. 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  Her vitamin levels are normal.  Screening for hemolysis is negative.  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 -     CBC with Differential/Platelet; Future -     Basic metabolic panel; Future -     Haptoglobin; Future -     Lactate  dehydrogenase; Future -     Vitamin B12; Future -     Ferritin; Future -     Folate; Future -     IBC panel; Future -     TSH; Future  Need for influenza vaccination -     Flu vaccine HIGH DOSE PF (Fluzone High dose)  Microalbuminuria due to type 2 diabetes mellitus ([place])- I have asked her to start taking an ARB to mitigate this. -     Azilsartan Medoxomil (EDARBI) 40 MG TABS; Take 1 tablet by mouth daily.  Chronic diastolic heart failure ([place])- She has a normal fluid status. -     Azilsartan Medoxomil (EDARBI) 40 MG TABS; Take 1 tablet by mouth daily.   I am having Sayana L. Roel start on Azilsartan Medoxomil. I am also having her maintain her fexofenadine, B-complex with vitamin C, Cholecalciferol, PRESCRIPTION MEDICATION, arformoterol, budesonide, Betamethasone Valerate, SYMBICORT, ciclopirox, ipratropium, furosemide, ranitidine, allopurinol, buPROPion, metoprolol tartrate, warfarin, lamoTRIgine, linagliptin, Vilazodone HCl, and modafinil.  Meds ordered this encounter  Medications  . Azilsartan Medoxomil (EDARBI) 40 MG TABS    Sig: Take 1 tablet by mouth daily.    Dispense:  90 tablet    Refill:  1     Follow-up: No follow-ups on file.  Scarlette Calico, MD

## 2018-04-21 ENCOUNTER — Encounter: Payer: Self-pay | Admitting: Internal Medicine

## 2018-04-21 DIAGNOSIS — R809 Proteinuria, unspecified: Secondary | ICD-10-CM

## 2018-04-21 DIAGNOSIS — E1129 Type 2 diabetes mellitus with other diabetic kidney complication: Secondary | ICD-10-CM | POA: Insufficient documentation

## 2018-04-21 LAB — LACTATE DEHYDROGENASE: LDH: 157 U/L (ref 120–250)

## 2018-04-21 LAB — HAPTOGLOBIN: HAPTOGLOBIN: 123 mg/dL (ref 43–212)

## 2018-04-21 MED ORDER — AZILSARTAN MEDOXOMIL 40 MG PO TABS: 1.0000 | ORAL_TABLET | Freq: Every day | ORAL | 1 refills | Status: DC

## 2018-04-21 NOTE — Patient Instructions (Signed)
Anemia Anemia is a condition in which you do not have enough red blood cells or hemoglobin. Hemoglobin is a substance in red blood cells that carries oxygen. When you do not have enough red blood cells or hemoglobin (are anemic), your body cannot get enough oxygen and your organs may not work properly. As a result, you may feel very tired or have other problems. What are the causes? Common causes of anemia include:  Excessive bleeding. Anemia can be caused by excessive bleeding inside or outside the body, including bleeding from the intestine or from periods in women.  Poor nutrition.  Long-lasting (chronic) kidney, thyroid, and liver disease.  Bone marrow disorders.  Cancer and treatments for cancer.  HIV (human immunodeficiency virus) and AIDS (acquired immunodeficiency syndrome).  Treatments for HIV and AIDS.  Spleen problems.  Blood disorders.  Infections, medicines, and autoimmune disorders that destroy red blood cells.  What are the signs or symptoms? Symptoms of this condition include:  Minor weakness.  Dizziness.  Headache.  Feeling heartbeats that are irregular or faster than normal (palpitations).  Shortness of breath, especially with exercise.  Paleness.  Cold sensitivity.  Indigestion.  Nausea.  Difficulty sleeping.  Difficulty concentrating.  Symptoms may occur suddenly or develop slowly. If your anemia is mild, you may not have symptoms. How is this diagnosed? This condition is diagnosed based on:  Blood tests.  Your medical history.  A physical exam.  Bone marrow biopsy.  Your health care provider may also check your stool (feces) for blood and may do additional testing to look for the cause of your bleeding. You may also have other tests, including:  Imaging tests, such as a CT scan or MRI.  Endoscopy.  Colonoscopy.  How is this treated? Treatment for this condition depends on the cause. If you continue to lose a lot of blood,  you may need to be treated at a hospital. Treatment may include:  Taking supplements of iron, vitamin B12, or folic acid.  Taking a hormone medicine (erythropoietin) that can help to stimulate red blood cell growth.  Having a blood transfusion. This may be needed if you lose a lot of blood.  Making changes to your diet.  Having surgery to remove your spleen.  Follow these instructions at home:  Take over-the-counter and prescription medicines only as told by your health care provider.  Take supplements only as told by your health care provider.  Follow any diet instructions that you were given.  Keep all follow-up visits as told by your health care provider. This is important. Contact a health care provider if:  You develop new bleeding anywhere in the body. Get help right away if:  You are very weak.  You are short of breath.  You have pain in your abdomen or chest.  You are dizzy or feel faint.  You have trouble concentrating.  You have bloody or black, tarry stools.  You vomit repeatedly or you vomit up blood. Summary  Anemia is a condition in which you do not have enough red blood cells or enough of a substance in your red blood cells that carries oxygen (hemoglobin).  Symptoms may occur suddenly or develop slowly.  If your anemia is mild, you may not have symptoms.  This condition is diagnosed with blood tests as well as a medical history and physical exam. Other tests may be needed.  Treatment for this condition depends on the cause of the anemia. This information is not intended to replace advice   given to you by your health care provider. Make sure you discuss any questions you have with your health care provider. Document Released: 08/28/2004 Document Revised: 08/22/2016 Document Reviewed: 08/22/2016 Elsevier Interactive Patient Education  [employer].

## 2018-04-28 ENCOUNTER — Telehealth: Payer: Self-pay

## 2018-04-28 ENCOUNTER — Other Ambulatory Visit: Payer: Self-pay | Admitting: Internal Medicine

## 2018-04-28 ENCOUNTER — Other Ambulatory Visit: Payer: Self-pay | Admitting: Pulmonary Disease

## 2018-04-28 DIAGNOSIS — R040 Epistaxis: Secondary | ICD-10-CM

## 2018-04-28 NOTE — Telephone Encounter (Signed)
Pt is in today with her husband and stated that she is experiencing a lot of nose bleeds.

## 2018-05-12 DIAGNOSIS — R040 Epistaxis: Secondary | ICD-10-CM | POA: Insufficient documentation

## 2018-05-30 ENCOUNTER — Other Ambulatory Visit: Payer: Self-pay | Admitting: Internal Medicine

## 2018-05-30 DIAGNOSIS — F418 Other specified anxiety disorders: Secondary | ICD-10-CM

## 2018-06-01 ENCOUNTER — Ambulatory Visit: Payer: Medicare Other | Admitting: General Practice

## 2018-06-01 DIAGNOSIS — Z7901 Long term (current) use of anticoagulants: Secondary | ICD-10-CM | POA: Diagnosis not present

## 2018-06-01 LAB — POCT INR: INR: 3 (ref 2.0–3.0)

## 2018-06-01 NOTE — Patient Instructions (Addendum)
Pre visit review using our clinic review tool, if applicable. No additional management support is needed unless otherwise documented below in the visit note.  Take 1 tablet today (10/29) and then continue to take 1 tablet daily except 2 tablets on Tuesdays and Fridays.  Re-check in 6 weeks.

## 2018-06-29 ENCOUNTER — Ambulatory Visit (INDEPENDENT_AMBULATORY_CARE_PROVIDER_SITE_OTHER): Payer: Medicare Other

## 2018-06-29 DIAGNOSIS — I442 Atrioventricular block, complete: Secondary | ICD-10-CM

## 2018-06-29 NOTE — Progress Notes (Signed)
Remote pacemaker transmission.   

## 2018-07-02 ENCOUNTER — Other Ambulatory Visit: Payer: Self-pay | Admitting: Internal Medicine

## 2018-07-02 DIAGNOSIS — I110 Hypertensive heart disease with heart failure: Secondary | ICD-10-CM

## 2018-07-02 DIAGNOSIS — I5032 Chronic diastolic (congestive) heart failure: Secondary | ICD-10-CM

## 2018-07-02 DIAGNOSIS — M1000 Idiopathic gout, unspecified site: Secondary | ICD-10-CM

## 2018-07-02 DIAGNOSIS — I422 Other hypertrophic cardiomyopathy: Secondary | ICD-10-CM

## 2018-07-04 ENCOUNTER — Other Ambulatory Visit: Payer: Self-pay | Admitting: Internal Medicine

## 2018-07-05 ENCOUNTER — Other Ambulatory Visit: Payer: Self-pay | Admitting: Internal Medicine

## 2018-07-05 DIAGNOSIS — K219 Gastro-esophageal reflux disease without esophagitis: Secondary | ICD-10-CM

## 2018-07-05 MED ORDER — FAMOTIDINE 40 MG PO TABS: 40.0000 mg | ORAL_TABLET | Freq: Every day | ORAL | 1 refills | Status: DC

## 2018-07-13 ENCOUNTER — Ambulatory Visit (INDEPENDENT_AMBULATORY_CARE_PROVIDER_SITE_OTHER): Payer: Medicare Other | Admitting: General Practice

## 2018-07-13 DIAGNOSIS — I4892 Unspecified atrial flutter: Secondary | ICD-10-CM | POA: Diagnosis not present

## 2018-07-13 DIAGNOSIS — Z7901 Long term (current) use of anticoagulants: Secondary | ICD-10-CM

## 2018-07-13 LAB — POCT INR: INR: 2.6 (ref 2.0–3.0)

## 2018-07-13 NOTE — Patient Instructions (Addendum)
Pre visit review using our clinic review tool, if applicable. No additional management support is needed unless otherwise documented below in the visit note.  Continue to take 1 tablet daily except 2 tablets on Tuesdays and Fridays.  Re-check in 6 weeks.

## 2018-07-20 ENCOUNTER — Ambulatory Visit: Payer: Medicare Other | Admitting: Internal Medicine

## 2018-07-20 ENCOUNTER — Ambulatory Visit: Payer: Self-pay

## 2018-07-20 ENCOUNTER — Encounter: Payer: Self-pay | Admitting: Internal Medicine

## 2018-07-20 ENCOUNTER — Other Ambulatory Visit: Payer: Self-pay | Admitting: Internal Medicine

## 2018-07-20 ENCOUNTER — Other Ambulatory Visit (INDEPENDENT_AMBULATORY_CARE_PROVIDER_SITE_OTHER): Payer: Medicare Other

## 2018-07-20 VITALS — BP 140/70 | HR 89 | Temp 98.3°F | Resp 16 | Ht 64.5 in | Wt 182.0 lb

## 2018-07-20 DIAGNOSIS — D638 Anemia in other chronic diseases classified elsewhere: Secondary | ICD-10-CM

## 2018-07-20 DIAGNOSIS — E118 Type 2 diabetes mellitus with unspecified complications: Secondary | ICD-10-CM | POA: Diagnosis not present

## 2018-07-20 DIAGNOSIS — F3341 Major depressive disorder, recurrent, in partial remission: Secondary | ICD-10-CM

## 2018-07-20 LAB — BASIC METABOLIC PANEL
BUN: 17 mg/dL (ref 6–23)
CO2: 30 mEq/L (ref 19–32)
Calcium: 10.2 mg/dL (ref 8.4–10.5)
Chloride: 104 mEq/L (ref 96–112)
Creatinine, Ser: 1.39 mg/dL — ABNORMAL HIGH (ref 0.40–1.20)
GFR: 39.29 mL/min — ABNORMAL LOW (ref >60.00)
Glucose, Bld: 123 mg/dL — ABNORMAL HIGH (ref 70–99)
POTASSIUM: 3.7 meq/L (ref 3.5–5.1)
Sodium: 141 mEq/L (ref 135–145)

## 2018-07-20 LAB — CBC WITH DIFFERENTIAL/PLATELET
BASOS PCT: 0.4 % (ref 0.0–3.0)
Basophils Absolute: 0 10*3/uL (ref 0.0–0.1)
EOS PCT: 5 % (ref 0.0–5.0)
Eosinophils Absolute: 0.3 10*3/uL (ref 0.0–0.7)
HCT: 28 % — ABNORMAL LOW (ref 36.0–46.0)
Hemoglobin: 9.1 g/dL — ABNORMAL LOW (ref 12.0–15.0)
Lymphocytes Relative: 11.7 % — ABNORMAL LOW (ref 12.0–46.0)
Lymphs Abs: 0.8 10*3/uL (ref 0.7–4.0)
MCHC: 32.4 g/dL (ref 30.0–36.0)
MCV: 87.9 fl (ref 78.0–100.0)
Monocytes Absolute: 0.7 10*3/uL (ref 0.1–1.0)
Monocytes Relative: 11.2 % (ref 3.0–12.0)
Neutro Abs: 4.7 10*3/uL (ref 1.4–7.7)
Neutrophils Relative %: 71.7 % (ref 43.0–77.0)
Platelets: 181 10*3/uL (ref 150.0–400.0)
RBC: 3.19 Mil/uL — ABNORMAL LOW (ref 3.87–5.11)
RDW: 15.5 % (ref 11.5–15.5)
WBC: 6.5 10*3/uL (ref 4.0–10.5)

## 2018-07-20 LAB — HEMOGLOBIN A1C: Hgb A1c MFr Bld: 6.2 % (ref 4.6–6.5)

## 2018-07-20 MED ORDER — BUPROPION HCL ER (XL) 300 MG PO TB24: 300.0000 mg | ORAL_TABLET | Freq: Every day | ORAL | 1 refills | Status: DC

## 2018-07-20 NOTE — Progress Notes (Signed)
Subjective:  Patient ID: TIMIKO OFFUTT, female    DOB: 1941-03-09  Age: 77 y.o. MRN: [id number]  CC: Anemia; Depression; Annual Exam; and Hypertension   HPI GINNIFER CREELMAN presents for f/up - She complains of anhedonia, sadness, crying spells, and sleeping too much.  Outpatient Medications Prior to Visit  Medication Sig Dispense Refill  . allopurinol (ZYLOPRIM) 100 MG tablet TAKE 100mg  TABLET BY MOUTH DAILY. 90 tablet 0  . arformoterol (BROVANA) 15 MCG/2ML NEBU Take 2 mLs (15 mcg total) by nebulization 2 (two) times daily. 120 mL 0  . Azilsartan Medoxomil (EDARBI) 40 MG TABS Take 1 tablet by mouth daily. 90 tablet 1  . B Complex-C (B-COMPLEX WITH VITAMIN C) tablet Take 1 tablet by mouth daily.    . Betamethasone Valerate 0.12 % foam APPLY TO SCALP QD (Patient taking differently: Apply 1 application topically once a week. Apply to scalp.) 100 g 2  . budesonide (PULMICORT) 0.25 MG/2ML nebulizer solution Take 2 mLs (0.25 mg total) by nebulization 2 (two) times daily. 60 mL 0  . Cholecalciferol 2000 UNITS TABS Take 1 tablet (2,000 Units total) by mouth daily. 90 tablet 3  . ciclopirox (LOPROX) 0.77 % SUSP APPLY 1 PUMP TWICE A DAY. 60 mL 3  . famotidine (PEPCID) 40 MG tablet Take 1 tablet (40 mg total) by mouth daily. 90 tablet 1  . fexofenadine (ALLEGRA) 180 MG tablet Take 180 mg by mouth daily as needed for allergies.     . furosemide (LASIX) 40 MG tablet TAKE 40mg  ONCE DAILY. 90 tablet 0  . ipratropium (ATROVENT) 0.03 % nasal spray USE 2 SPRAYS IN EACH NOSTRIL 3 TIMES A DAY AS NEEDED. 30 mL 0  . lamoTRIgine (LAMICTAL) 25 MG tablet TAKE 1 TABLET ONCE DAILY. 90 tablet 1  . metoprolol tartrate (LOPRESSOR) 25 MG tablet TAKE 25mg  BY MOUTH TWICE DAILY. 180 tablet 0  . modafinil (PROVIGIL) 100 MG tablet Take 1 tablet (100 mg total) by mouth daily. 30 tablet 5  . PRESCRIPTION MEDICATION CPAP    . SYMBICORT 80-4.5 MCG/ACT inhaler USE 2 PUFFS TWICE A DAY. (Patient taking differently:  INHALE 2 PUFFS TWICE A DAY.) 10.2 g 5  . TRADJENTA 5 MG TABS tablet TAKE 1 TABLET BY MOUTH DAILY. 90 tablet 0  . Vilazodone HCl (VIIBRYD) 40 MG TABS TAKE 40mg  BY MOUTH DAILY. 90 tablet 1  . warfarin (COUMADIN) 2.5 MG tablet TAKE AS DIRECTED PER COUMADIN CLINIC. 40 tablet 0  . buPROPion (WELLBUTRIN XL) 150 MG 24 hr tablet TAKE 150mg  TABLET ONCE DAILY. 90 tablet 0   No facility-administered medications prior to visit.     ROS Review of Systems  Constitutional: Positive for fatigue. Negative for diaphoresis and unexpected weight change.  HENT: Positive for nosebleeds.        She has rare nosebleeds.  She tells me she is having a procedure soon with ENT.  Eyes: Negative.   Respiratory: Negative.  Negative for cough, chest tightness, shortness of breath and wheezing.   Cardiovascular: Negative for chest pain, palpitations and leg swelling.  Gastrointestinal: Negative for abdominal pain, blood in stool, constipation, diarrhea, nausea and vomiting.  Endocrine: Negative.   Genitourinary: Negative.  Negative for difficulty urinating.  Musculoskeletal: Negative.  Negative for arthralgias, back pain, myalgias and neck pain.  Skin: Negative.  Negative for color change and pallor.  Neurological: Negative.  Negative for dizziness, weakness, light-headedness and headaches.  Hematological: Negative for adenopathy. Does not bruise/bleed easily.  Psychiatric/Behavioral: Positive for dysphoric  mood and sleep disturbance. Negative for agitation, behavioral problems, decreased concentration, hallucinations, self-injury and suicidal ideas. The patient is not nervous/anxious and is not hyperactive.     Objective:  BP 140/70 (BP Location: Left Arm, Patient Position: Sitting, Cuff Size: Normal)   Pulse 89   Temp 98.3 F (36.8 C) (Oral)   Resp 16   Ht 5' 4.5" (1.638 m)   Wt 182 lb (82.6 kg)   SpO2 91%   BMI 30.76 kg/m   BP Readings from Last 3 Encounters:  07/20/18 140/70  04/20/18 130/68  03/29/18  120/70    Wt Readings from Last 3 Encounters:  07/20/18 182 lb (82.6 kg)  04/20/18 183 lb 8 oz (83.2 kg)  03/29/18 181 lb 12.8 oz (82.5 kg)    Physical Exam Vitals signs reviewed.  Constitutional:      Appearance: Normal appearance. She is normal weight. She is not ill-appearing.  HENT:     Nose: Nose normal.     Mouth/Throat:     Mouth: Mucous membranes are moist.     Pharynx: No posterior oropharyngeal erythema.  Eyes:     Conjunctiva/sclera: Conjunctivae normal.  Neck:     Musculoskeletal: Normal range of motion and neck supple. No muscular tenderness.  Cardiovascular:     Rate and Rhythm: Normal rate and regular rhythm.     Heart sounds: Murmur present. Systolic murmur present with a grade of 1/6. No diastolic murmur. No gallop.   Pulmonary:     Effort: Pulmonary effort is normal.     Breath sounds: Normal breath sounds. No wheezing, rhonchi or rales.  Abdominal:     General: Abdomen is flat.     Palpations: There is no hepatomegaly, splenomegaly or mass.     Tenderness: There is no abdominal tenderness.  Musculoskeletal: Normal range of motion.        General: No swelling, tenderness or signs of injury.  Skin:    General: Skin is warm and dry.  Neurological:     General: No focal deficit present.     Mental Status: She is alert. Mental status is at baseline.  Psychiatric:        Attention and Perception: Attention and perception normal.        Mood and Affect: Mood is depressed. Mood is not anxious. Affect is flat and tearful. Affect is not angry.        Speech: Speech normal.        Behavior: Behavior normal. Behavior is cooperative.        Thought Content: Thought content normal. Thought content does not include homicidal or suicidal ideation.        Cognition and Memory: Cognition normal.        Judgment: Judgment normal.     Lab Results  Component Value Date   WBC 6.5 07/20/2018   HGB 9.1 (L) 07/20/2018   HCT 28.0 (L) 07/20/2018   PLT 181.0 07/20/2018     GLUCOSE 123 (H) 07/20/2018   CHOL 125 11/17/2017   TRIG 73.0 11/17/2017   HDL 48.80 11/17/2017   LDLCALC 62 11/17/2017   ALT 11 02/19/2018   AST 24 02/19/2018   NA 141 07/20/2018   K 3.7 07/20/2018   CL 104 07/20/2018   CREATININE 1.39 (H) 07/20/2018   BUN 17 07/20/2018   CO2 30 07/20/2018   TSH 1.77 04/20/2018   INR 2.6 07/13/2018   HGBA1C 6.2 07/20/2018   MICROALBUR 63.9 (H) 04/20/2018    Mm 3d  Screen Breast Bilateral  Result Date: 04/19/2018 CLINICAL DATA:  Screening. EXAM: DIGITAL SCREENING BILATERAL MAMMOGRAM WITH TOMO AND CAD COMPARISON:  Previous exam(s). ACR Breast Density Category b: There are scattered areas of fibroglandular density. FINDINGS: There are no findings suspicious for malignancy. Images were processed with CAD. IMPRESSION: No mammographic evidence of malignancy. A result letter of this screening mammogram will be mailed directly to the patient. RECOMMENDATION: Screening mammogram in one year. (Code:SM-B-01Y) BI-RADS CATEGORY  1: Negative. Electronically Signed   By: Margarette Canada M.D.   On: 04/19/2018 16:10    Assessment & Plan:   Lorisa was seen today for anemia, depression, annual exam and hypertension.  Diagnoses and all orders for this visit:  Anemia, chronic disease- Her H&H have gone down some.  Her only source of blood loss is epistaxis which will be treated soon.  Her recent vitamin levels were all normal recently.  This appears to be the anemia of chronic disease. -     CBC with Differential/Platelet; Future  Type II diabetes mellitus with manifestations (Jet) - Her blood sugars are adequately well controlled. -     Hemoglobin A1c; Future -     Basic metabolic panel; Future  Recurrent major depressive disorder, in partial remission (Edd[place]) -  recommended that she double the dose of bupropion to get better control of her depressive symptoms.  Will continue Viibryd at the current dose. -     buPROPion (WELLBUTRIN XL) 300 MG 24 hr tablet; Take 1  tablet (300 mg total) by mouth daily.   I have discontinued [employer]eva [employer] M[employer]'s buPROPion. I am also having her start on buPROPion. Additionally, I am having her maintain her fexofenadine, B-complex with vitamin C, Cholecalciferol, PRESCRIPTION MEDICATION, arformoterol, budesonide, Betamethasone Valerate, SYMBICORT, ciclopirox, Vilazodone HCl, modafinil, Azilsartan Medoxomil, metoprolol tartrate, ipratropium, lamoTRIgine, TRADJENTA, allopurinol, furosemide, warfarin, and famotidine.  Meds ordered this encounter  Medications  . buPROPion (WELLBUTRIN XL) 300 MG 24 hr tablet    Sig: Take 1 tablet (300 mg total) by mouth daily.    Dispense:  90 tablet    Refill:  1     Follow-up: Return in about 4 months (around 11/19/2018).  Scarlette Calico, MD

## 2018-07-20 NOTE — Patient Instructions (Signed)
Major Depressive Disorder, Adult Major depressive disorder (MDD) is a mental health condition. It may also be called clinical depression or unipolar depression. MDD usually causes feelings of sadness, hopelessness, or helplessness. MDD can also cause physical symptoms. It can interfere with work, school, relationships, and other everyday activities. MDD may be mild, moderate, or severe. It may occur once (single episode major depressive disorder) or it may occur multiple times (recurrent major depressive disorder). What are the causes? The exact cause of this condition is not known. MDD is most likely caused by a combination of things, which may include:  Genetic factors. These are traits that are passed along from parent to child.  Individual factors. Your personality, your behavior, and the way you handle your thoughts and feelings may contribute to MDD. This includes personality traits and behaviors learned from others.  Physical factors, such as: ? Differences in the part of your brain that controls emotion. This part of your brain may be different than it is in people who do not have MDD. ? Long-term (chronic) medical or psychiatric illnesses.  Social factors. Traumatic experiences or major life changes may play a role in the development of MDD.  What increases the risk? This condition is more likely to develop in women. The following factors may also make you more likely to develop MDD:  A family history of depression.  Troubled family relationships.  Abnormally low levels of certain brain chemicals.  Traumatic events in childhood, especially abuse or the loss of a parent.  Being under a lot of stress, or long-term stress, especially from upsetting life experiences or losses.  A history of: ? Chronic physical illness. ? Other mental health disorders. ? Substance abuse.  Poor living conditions.  Experiencing social exclusion or discrimination on a regular basis.  What are  the signs or symptoms? The main symptoms of MDD typically include:  Constant depressed or irritable mood.  Loss of interest in things and activities.  MDD symptoms may also include:  Sleeping or eating too much or too little.  Unexplained weight change.  Fatigue or low energy.  Feelings of worthlessness or guilt.  Difficulty thinking clearly or making decisions.  Thoughts of suicide or of harming others.  Physical agitation or weakness.  Isolation.  Severe cases of MDD may also occur with other symptoms, such as:  Delusions or hallucinations, in which you imagine things that are not real (psychotic depression).  Low-level depression that lasts at least a year (chronic depression or persistent depressive disorder).  Extreme sadness and hopelessness (melancholic depression).  Trouble speaking and moving (catatonic depression).  How is this diagnosed? This condition may be diagnosed based on:  Your symptoms.  Your medical history, including your mental health history. This may involve tests to evaluate your mental health. You may be asked questions about your lifestyle, including any drug and alcohol use, and how long you have had symptoms of MDD.  A physical exam.  Blood tests to rule out other conditions.  You must have a depressed mood and at least four other MDD symptoms most of the day, nearly every day in the same 2-week timeframe before your health care provider can confirm a diagnosis of MDD. How is this treated? This condition is usually treated by mental health professionals, such as psychologists, psychiatrists, and clinical social workers. You may need more than one type of treatment. Treatment may include:  Psychotherapy. This is also called talk therapy or counseling. Types of psychotherapy include: ? Cognitive behavioral   therapy (CBT). This type of therapy teaches you to recognize unhealthy feelings, thoughts, and behaviors, and replace them with  positive thoughts and actions. ? Interpersonal therapy (IPT). This helps you to improve the way you relate to and communicate with others. ? Family therapy. This treatment includes members of your family.  Medicine to treat anxiety and depression, or to help you control certain emotions and behaviors.  Lifestyle changes, such as: ? Limiting alcohol and drug use. ? Exercising regularly. ? Getting plenty of sleep. ? Making healthy eating choices. ? Spending more time outdoors.  Treatments involving stimulation of the brain can be used in situations with extremely severe symptoms, or when medicine or other therapies do not work over time. These treatments include electroconvulsive therapy, transcranial magnetic stimulation, and vagal nerve stimulation. Follow these instructions at home: Activity  Return to your normal activities as told by your health care provider.  Exercise regularly and spend time outdoors as told by your health care provider. General instructions  Take over-the-counter and prescription medicines only as told by your health care provider.  Do not drink alcohol. If you drink alcohol, limit your alcohol intake to no more than 1 drink a day for nonpregnant women and 2 drinks a day for men. One drink equals 12 oz of beer, 5 oz of wine, or 1 oz of hard liquor. Alcohol can affect any antidepressant medicines you are taking. Talk to your health care provider about your alcohol use.  Eat a healthy diet and get plenty of sleep.  Find activities that you enjoy doing, and make time to do them.  Consider joining a support group. Your health care provider may be able to recommend a support group.  Keep all follow-up visits as told by your health care provider. This is important. Where to find more information: National Alliance on Mental Illness  www.nami.org  [place] National Institute of Mental Health  www.nimh.nih.gov  National Suicide Prevention  Lifeline  1-800-273-TALK (8255). This is free, 24-hour help.  Contact a health care provider if:  Your symptoms get worse.  You develop new symptoms. Get help right away if:  You self-harm.  You have serious thoughts about hurting yourself or others.  You see, hear, taste, smell, or feel things that are not present (hallucinate). This information is not intended to replace advice given to you by your health care provider. Make sure you discuss any questions you have with your health care provider. Document Released: 11/15/2012 Document Revised: 03/27/2016 Document Reviewed: 01/30/2016 Elsevier Interactive Patient Education  2018 [employer]  

## 2018-07-23 ENCOUNTER — Ambulatory Visit (INDEPENDENT_AMBULATORY_CARE_PROVIDER_SITE_OTHER): Payer: Medicare Other

## 2018-07-23 DIAGNOSIS — M810 Age-related osteoporosis without current pathological fracture: Secondary | ICD-10-CM | POA: Diagnosis not present

## 2018-07-23 MED ORDER — DENOSUMAB 60 MG/ML ~~LOC~~ SOSY
60.0000 mg | PREFILLED_SYRINGE | Freq: Once | SUBCUTANEOUS | Status: AC
  Administered 2018-07-23: 60 mg via SUBCUTANEOUS

## 2018-08-11 ENCOUNTER — Other Ambulatory Visit: Payer: Self-pay | Admitting: Internal Medicine

## 2018-08-11 NOTE — Progress Notes (Signed)
I have reviewed and agree.

## 2018-08-20 LAB — CUP PACEART REMOTE DEVICE CHECK
Battery Impedance: 456 Ohm
Battery Remaining Longevity: 86 mo
Brady Statistic AP VP Percent: 38 %
Brady Statistic AP VS Percent: 0 %
Brady Statistic AS VP Percent: 62 %
Brady Statistic AS VS Percent: 0 %
Date Time Interrogation Session: 20191126164713
Implantable Lead Implant Date: 20131210
Implantable Lead Implant Date: 20131210
Implantable Lead Location: 753858
Implantable Lead Location: 753859
Implantable Lead Model: 4296
Implantable Lead Model: 5076
Implantable Pulse Generator Implant Date: 20131210
Lead Channel Impedance Value: 424 Ohm
Lead Channel Impedance Value: 813 Ohm
Lead Channel Pacing Threshold Amplitude: 0.875 V
Lead Channel Pacing Threshold Amplitude: 0.875 V
Lead Channel Pacing Threshold Pulse Width: 0.4 ms
Lead Channel Setting Pacing Amplitude: 2 V
Lead Channel Setting Pacing Amplitude: 2.5 V
Lead Channel Setting Pacing Pulse Width: 0.4 ms
Lead Channel Setting Sensing Sensitivity: 8 mV
MDC IDC MSMT BATTERY VOLTAGE: 2.79 V
MDC IDC MSMT LEADCHNL RV PACING THRESHOLD PULSEWIDTH: 0.4 ms

## 2018-08-24 ENCOUNTER — Ambulatory Visit (INDEPENDENT_AMBULATORY_CARE_PROVIDER_SITE_OTHER): Payer: Medicare Other | Admitting: General Practice

## 2018-08-24 DIAGNOSIS — Z7901 Long term (current) use of anticoagulants: Secondary | ICD-10-CM

## 2018-08-24 LAB — POCT INR: INR: 3 (ref 2.0–3.0)

## 2018-08-24 NOTE — Patient Instructions (Addendum)
Pre visit review using our clinic review tool, if applicable. No additional management support is needed unless otherwise documented below in the visit note.  Take 1 tablet today (1/21) and continue to take 1 tablet daily except 2 tablets on Tuesdays and Fridays.  Re-check in 6 weeks.  Increase greens 2 to 3 servings a week.

## 2018-09-08 ENCOUNTER — Encounter: Payer: Self-pay | Admitting: Internal Medicine

## 2018-09-10 ENCOUNTER — Ambulatory Visit (INDEPENDENT_AMBULATORY_CARE_PROVIDER_SITE_OTHER): Payer: Medicare Other | Admitting: Internal Medicine

## 2018-09-10 ENCOUNTER — Other Ambulatory Visit (INDEPENDENT_AMBULATORY_CARE_PROVIDER_SITE_OTHER): Payer: Medicare Other

## 2018-09-10 ENCOUNTER — Encounter: Payer: Self-pay | Admitting: Internal Medicine

## 2018-09-10 VITALS — BP 122/68 | HR 87 | Temp 98.5°F | Ht 64.5 in | Wt 192.0 lb

## 2018-09-10 DIAGNOSIS — D638 Anemia in other chronic diseases classified elsewhere: Secondary | ICD-10-CM | POA: Diagnosis not present

## 2018-09-10 DIAGNOSIS — R809 Proteinuria, unspecified: Secondary | ICD-10-CM | POA: Diagnosis not present

## 2018-09-10 DIAGNOSIS — I5032 Chronic diastolic (congestive) heart failure: Secondary | ICD-10-CM

## 2018-09-10 LAB — BASIC METABOLIC PANEL
BUN: 31 mg/dL — ABNORMAL HIGH (ref 6–23)
CO2: 28 meq/L (ref 19–32)
Calcium: 9.7 mg/dL (ref 8.4–10.5)
Chloride: 104 mEq/L (ref 96–112)
Creatinine, Ser: 2.08 mg/dL — ABNORMAL HIGH (ref 0.40–1.20)
GFR: 23.21 mL/min — ABNORMAL LOW (ref >60.00)
Glucose, Bld: 88 mg/dL (ref 70–99)
Potassium: 4 mEq/L (ref 3.5–5.1)
Sodium: 141 mEq/L (ref 135–145)

## 2018-09-10 LAB — CBC WITH DIFFERENTIAL/PLATELET
Basophils Absolute: 0 10*3/uL (ref 0.0–0.1)
Basophils Relative: 0.4 % (ref 0.0–3.0)
Eosinophils Absolute: 0.3 10*3/uL (ref 0.0–0.7)
Eosinophils Relative: 5.1 % — ABNORMAL HIGH (ref 0.0–5.0)
HCT: 26 % — ABNORMAL LOW (ref 36.0–46.0)
Hemoglobin: 8.3 g/dL — ABNORMAL LOW (ref 12.0–15.0)
Lymphocytes Relative: 10.9 % — ABNORMAL LOW (ref 12.0–46.0)
Lymphs Abs: 0.7 10*3/uL (ref 0.7–4.0)
MCHC: 31.9 g/dL (ref 30.0–36.0)
MCV: 82.8 fl (ref 78.0–100.0)
MONOS PCT: 11.3 % (ref 3.0–12.0)
Monocytes Absolute: 0.7 10*3/uL (ref 0.1–1.0)
Neutro Abs: 4.7 10*3/uL (ref 1.4–7.7)
Neutrophils Relative %: 72.3 % (ref 43.0–77.0)
Platelets: 169 10*3/uL (ref 150.0–400.0)
RBC: 3.14 Mil/uL — ABNORMAL LOW (ref 3.87–5.11)
RDW: 16.8 % — ABNORMAL HIGH (ref 11.5–15.5)
WBC: 6.5 10*3/uL (ref 4.0–10.5)

## 2018-09-10 MED ORDER — FUROSEMIDE 40 MG PO TABS: 40.0000 mg | ORAL_TABLET | Freq: Two times a day (BID) | ORAL | 3 refills | Status: DC

## 2018-09-10 MED ORDER — POTASSIUM CHLORIDE ER 10 MEQ PO TBCR: 10.0000 meq | EXTENDED_RELEASE_TABLET | Freq: Every day | ORAL | 3 refills | Status: DC

## 2018-09-10 NOTE — Patient Instructions (Addendum)
OK to increase the lasix to 40 mg twice per day  - Every Day  Please take all new medication as prescribed - the potassium pill every day as well  Please check your weight every day at home first thing in the AM after visiting the Bathroom; the goal is to have 1/2 - 1 lb off per day, so please call in 1 week if this is not the case  Please continue all other medications as before, and refills have been done if requested.  Please have the pharmacy call with any other refills you may need.  Please continue your efforts at being more active, low cholesterol diabetic diet, and weight control.  Please keep your appointments with your specialists as you may have planned  Please go to the LAB in the Basement (turn left off the elevator) for the tests to be done today  You will be contacted by phone if any changes need to be made immediately.  Otherwise, you will receive a letter about your results with an explanation, but please check with MyChart first.  Please remember to sign up for MyChart if you have not done so, as this will be important to you in the future with finding out test results, communicating by private email, and scheduling acute appointments online when needed.  Please return in 2 weeks to Dr Ronnald Ramp, or sooner if needed, as you will likely need further lab testing

## 2018-09-10 NOTE — Assessment & Plan Note (Signed)
With worsening volume, for increased lasix 40 bid daily, kdur 10 qd, daily wts at home, cont all other meds, f/u PC[facility] Dr Ronnald Ramp in 2 weeks

## 2018-09-10 NOTE — Progress Notes (Signed)
Subjective:    Patient ID: Holly Simmons, female    DOB: 1940-10-24, 78 y.o.   MRN: [id number]  HPI  Here to f/u; overall doing ok,  Pt denies chest pain, increasing sob or doe, wheezing, orthopnea, PND, palpitations, dizziness or syncope, but has worsening LE swelling assoc with wt gain. Over past 2 wks.  Normally takes lasix qod for many years since 2013, but has taken BID on her qod dosing x 3 wks since she had persistent swelling and has been told per Dr Rayann Heman she could do this in the past.  Wt is increased despite this., all in the setting of labs c/w ? mild worsening proteinuria, and CKD with anemia recently.  Pt denies new neurological symptoms such as new headache, or facial or extremity weakness or numbness   Pt denies polydipsia, polyuria  Wt Readings from Last 3 Encounters:  09/10/18 192 lb (87.1 kg)  07/20/18 182 lb (82.6 kg)  04/20/18 183 lb 8 oz (83.2 kg)   BP Readings from Last 3 Encounters:  09/10/18 122/68  07/20/18 140/70  04/20/18 130/68   Past Medical History:  Diagnosis Date  . Allergic rhinitis   . Asthma    NOS w/ acute exacerbation  . Blood transfusion without reported diagnosis   . Colon polyps    TUBULAR ADENOMAS AND HYPERPLASTIC  . Complete heart block (HCC)    requiring PPM (MDT) post surgical myomectomy at [facility],  leads are epicardial with abdominal implant, high ventricular threshold at implant  . COPD (chronic obstructive pulmonary disease) ([place])   . Depressive disorder   . Diastolic dysfunction   . DM (diabetes mellitus) ([place])   . Gallstones   . GERD (gastroesophageal reflux disease)   . Gout 08/20/2012  . Heart murmur   . Hyperpotassemia   . Hypersomnia   . Hypertension   . Hypertrophic cardiomyopathy ([place])    s/p surgical myomectomy at [facility] 45/03 complicated by septal VSD post procedure requiring reoperation with patch closure and tricuspid valve replacement  . Kidney stones   . Myocardial infarction ([place]) 2011  . Obstructive sleep  apnea    persistent daytime sleepiness despite cpap  . 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 Psoriasis   . Pyuria   . Renal insufficiency   . 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 Typical atrial flutter ([place]) 9/15   Past Surgical History:  Procedure Laterality Date  . ABDOMINAL HYSTERECTOMY    . APPENDECTOMY    . BREAST CYST EXCISION    . CARDIOVERSION N/A 08/01/2014   Procedure: CARDIOVERSION;  Surgeon: Thayer Headings, MD;  Location: [place];  Service: Cardiovascular;  Laterality: N/A;  . CESAREAN SECTION    . CHOLECYSTECTOMY    . COLONOSCOPY    . EYE SURGERY  [id number]   left eye lens implant  . EYE SURGERY  [id number]   right eye lens implant  . INSERT / REPLACE / REMOVE PACEMAKER  07/13/2012  . PACEMAKER INSERTION  07/02/11   epicardial wires with abdominal implant at [facility] 12/12,  high ventricular lead threshold at implant per Dr Westley Gambles  . PERMANENT PACEMAKER INSERTION N/A 07/13/2012   Procedure: PERMANENT PACEMAKER INSERTION;  Surgeon: Thompson Grayer, MD;  Location: [facility] CATH LAB;  Service: Cardiovascular;  Laterality: N/A;  . POLYPECTOMY    . septal myomectomy for hypertrophic CM  06/23/11   by Dr Evelina Dun at [facility], complicated by septal VSD requiring patch repair and tricuspid valve replacement  . TONSILLECTOMY    .  TRICUSPID VALVE REPLACEMENT  11/12   Medtronic 55mm Mosaic tissue valve  . VSD REPAIR  06/2011    reports that she has never smoked. She has never used smokeless tobacco. She reports that she does not drink alcohol or use drugs. family history includes Bladder Cancer in her father; Breast cancer in her mother and paternal aunt; Cancer in her father and mother; Dementia in her maternal aunt, maternal uncle, and mother; Heart attack in her father; Heart disease in her maternal grandfather and paternal grandfather; Hypertension in her daughter and mother; Ovarian cancer in her maternal aunt; Pancreatic cancer in her cousin; Prostate cancer in her father; Varicose  Veins in her father. Allergies  Allergen Reactions  . Sulfa Antibiotics Rash  . Sulfonamide Derivatives Anaphylaxis and Swelling  . Tetanus Toxoid     Other reaction(s): Other (See Comments) OTHER REACTION  . Tetanus Toxoids Swelling  . Other Rash    STERI - STRIPS   . Nsaids Other (See Comments)    REACTION: proteinuria Other reaction(s): Other (See Comments), Unknown It can affect proteins in her kidneys so she doesn't take   . Tetanus Toxoid, Adsorbed     Other reaction(s): Other (See Comments) Turned arm red   Current Outpatient Medications on File Prior to Visit  Medication Sig Dispense Refill  . allopurinol (ZYLOPRIM) 100 MG tablet TAKE 100mg  TABLET BY MOUTH DAILY. 90 tablet 0  . arformoterol (BROVANA) 15 MCG/2ML NEBU Take 2 mLs (15 mcg total) by nebulization 2 (two) times daily. 120 mL 0  . Azilsartan Medoxomil (EDARBI) 40 MG TABS Take 1 tablet by mouth daily. 90 tablet 1  . B Complex-C (B-COMPLEX WITH VITAMIN C) tablet Take 1 tablet by mouth daily.    . Betamethasone Valerate 0.12 % foam APPLY TO SCALP QD (Patient taking differently: Apply 1 application topically once a week. Apply to scalp.) 100 g 2  . budesonide (PULMICORT) 0.25 MG/2ML nebulizer solution Take 2 mLs (0.25 mg total) by nebulization 2 (two) times daily. 60 mL 0  . buPROPion (WELLBUTRIN XL) 300 MG 24 hr tablet Take 1 tablet (300 mg total) by mouth daily. 90 tablet 1  . Cholecalciferol 2000 UNITS TABS Take 1 tablet (2,000 Units total) by mouth daily. 90 tablet 3  . ciclopirox (LOPROX) 0.77 % SUSP APPLY 1 PUMP TWICE A DAY. 60 mL 3  . famotidine (PEPCID) 40 MG tablet Take 1 tablet (40 mg total) by mouth daily. 90 tablet 1  . fexofenadine (ALLEGRA) 180 MG tablet Take 180 mg by mouth daily as needed for allergies.     Marland Kitchen ipratropium (ATROVENT) 0.03 % nasal spray USE 2 SPRAYS IN EACH NOSTRIL 3 TIMES A DAY AS NEEDED. 30 mL 0  . lamoTRIgine (LAMICTAL) 25 MG tablet TAKE 1 TABLET ONCE DAILY. 90 tablet 1  . metoprolol  tartrate (LOPRESSOR) 25 MG tablet TAKE 1 TABLET BY MOUTH TWICE DAILY. 180 tablet 1  . modafinil (PROVIGIL) 100 MG tablet Take 1 tablet (100 mg total) by mouth daily. 30 tablet 5  . PRESCRIPTION MEDICATION CPAP    . SYMBICORT 80-4.5 MCG/ACT inhaler USE 2 PUFFS TWICE A DAY. (Patient taking differently: INHALE 2 PUFFS TWICE A DAY.) 10.2 g 5  . TRADJENTA 5 MG TABS tablet TAKE 1 TABLET BY MOUTH DAILY. 90 tablet 0  . Vilazodone HCl (VIIBRYD) 40 MG TABS TAKE 40mg  BY MOUTH DAILY. 90 tablet 1  . warfarin (COUMADIN) 2.5 MG tablet TAKE AS DIRECTED PER COUMADIN CLINIC. 40 tablet 0  . [DISCONTINUED]  Alum & Mag Hydroxide-Simeth (MAGIC MOUTHWASH) SOLN Take 5 mLs by mouth 3 (three) times daily. 14 days then stop      No current facility-administered medications on file prior to visit.    Review of Systems  Constitutional: Negative for other unusual diaphoresis or sweats HENT: Negative for ear discharge or swelling Eyes: Negative for other worsening visual disturbances Respiratory: Negative for stridor or other swelling  Gastrointestinal: Negative for worsening distension or other blood Genitourinary: Negative for retention or other urinary change Musculoskeletal: Negative for other MSK pain or swelling Skin: Negative for color change or other new lesions Neurological: Negative for worsening tremors and other numbness  Psychiatric/Behavioral: Negative for worsening agitation or other fatigue All other system neg per pt    Objective:   Physical Exam  BP 122/68   Pulse 87   Temp 98.5 F (36.9 C) (Oral)   Ht 5' 4.5" (1.638 m)   Wt 192 lb (87.1 kg)   SpO2 92%   BMI 32.45 kg/m  VS noted, not ill appearing Constitutional: Pt appears in NAD HENT: Head: NCAT.  Right Ear: External ear normal.  Left Ear: External ear normal.  Eyes: . Pupils are equal, round, and reactive to light. Conjunctivae and EOM are normal Nose: without d/c or deformity Neck: Neck supple. Gross normal ROM Cardiovascular: Normal  rate and regular rhythm.   Pulmonary/Chest: Effort normal and breath sounds without rales or wheezing.  Abd:  Soft, NT, ND, + BS, no organomegaly Neurological: Pt is alert. At baseline orientation, motor grossly intact Skin: Skin is warm. No rashes, other new lesions, 1-2+ bilat LE edema to knees Psychiatric: Pt behavior is normal without agitation  No other exam findings  Lab Results  Component Value Date   WBC 6.5 07/20/2018   HGB 9.1 (L) 07/20/2018   HCT 28.0 (L) 07/20/2018   PLT 181.0 07/20/2018   GLUCOSE 123 (H) 07/20/2018   CHOL 125 11/17/2017   TRIG 73.0 11/17/2017   HDL 48.80 11/17/2017   LDLCALC 62 11/17/2017   ALT 11 02/19/2018   AST 24 02/19/2018   NA 141 07/20/2018   K 3.7 07/20/2018   CL 104 07/20/2018   CREATININE 1.39 (H) 07/20/2018   BUN 17 07/20/2018   CO2 30 07/20/2018   TSH 1.77 04/20/2018   INR 3.0 08/24/2018   HGBA1C 6.2 07/20/2018   MICROALBUR 63.9 (H) 04/20/2018    Echo Mar 23, 2018 - summary only Study Conclusions  - Left ventricle: basal and mid septal hypokinesis with intact VSD   patch repair. The cavity size was mildly dilated. Wall thickness   was normal. The estimated ejection fraction was 55%. - Aortic valve: There was mild stenosis. There was mild   regurgitation. - Mitral valve: Calcified annulus. There was mild regurgitation. - Left atrium: The atrium was mildly dilated. - Atrial septum: No defect or patent foramen ovale was identified. - Tricuspid valve: Bioprosthetic TVR with mild TR and moderate   appearing stenosis by CW Doppler - Pulmonary arteries: PA peak pressure: 38 mm Hg (S).     Assessment & Plan:

## 2018-09-10 NOTE — Assessment & Plan Note (Signed)
?   Significance, ? Nephrotic vs non nephrotic, for 24 urine given also volume overaload

## 2018-09-10 NOTE — Assessment & Plan Note (Signed)
stable overall by history and exam, recent data reviewed with pt, and pt to continue medical treatment as before,  to f/u any worsening symptoms or concerns, for f/u cbc

## 2018-09-12 ENCOUNTER — Other Ambulatory Visit: Payer: Self-pay | Admitting: Internal Medicine

## 2018-09-12 ENCOUNTER — Encounter: Payer: Self-pay | Admitting: Internal Medicine

## 2018-09-12 DIAGNOSIS — N183 Chronic kidney disease, stage 3 unspecified: Secondary | ICD-10-CM

## 2018-09-12 DIAGNOSIS — N185 Chronic kidney disease, stage 5: Secondary | ICD-10-CM | POA: Insufficient documentation

## 2018-09-12 DIAGNOSIS — N1832 Chronic kidney disease, stage 3b: Secondary | ICD-10-CM | POA: Insufficient documentation

## 2018-09-12 HISTORY — DX: Chronic kidney disease, stage 3 unspecified: N18.30

## 2018-09-13 ENCOUNTER — Telehealth: Payer: Self-pay

## 2018-09-13 NOTE — Telephone Encounter (Signed)
Pt has viewed results via MyChart  

## 2018-09-13 NOTE — Telephone Encounter (Signed)
-----   Message from Biagio Borg, MD sent at 09/12/2018  3:38 PM EST ----- Left message on MyChart, pt to cont same tx except  The test results show that your current treatment is OK, except the kidney function appears to be worsening just in the past 2 months.  The reason for this is not clear, and your medications do not need to be changed for now.  We should however go a step farther by having you get a renal ultrasound (kidney ultrasound) and refer you to Nephrology as well.  You also have worsening anemia (that does nt need transfusion for now) that I believe possibly to be related to the worsening kidney function (b/c they are related).    Please do not take any anti-inflammatory medications such as the OTC advil or alleve as this can make the kidney function worse.  Genora Arp to please inform pt, I will do referral

## 2018-09-15 LAB — PROTEIN, URINE, 24 HOUR: Protein, 24H Urine: 23 mg/24 h (ref 0–149)

## 2018-09-15 LAB — HM DIABETES EYE EXAM

## 2018-09-15 LAB — TIQ-MISC

## 2018-09-16 LAB — HM DIABETES EYE EXAM

## 2018-09-17 ENCOUNTER — Ambulatory Visit
Admission: RE | Admit: 2018-09-17 | Discharge: 2018-09-17 | Disposition: A | Discharge status: Home / Self Care | Payer: Medicare Other | Source: Ambulatory Visit | Attending: Internal Medicine | Admitting: Internal Medicine

## 2018-09-17 DIAGNOSIS — N183 Chronic kidney disease, stage 3 unspecified: Secondary | ICD-10-CM

## 2018-09-23 ENCOUNTER — Encounter: Payer: Self-pay | Admitting: Internal Medicine

## 2018-09-23 ENCOUNTER — Ambulatory Visit: Payer: Medicare Other | Admitting: Internal Medicine

## 2018-09-23 ENCOUNTER — Other Ambulatory Visit (INDEPENDENT_AMBULATORY_CARE_PROVIDER_SITE_OTHER): Payer: Medicare Other

## 2018-09-23 VITALS — BP 140/62 | HR 69 | Temp 97.8°F | Ht 64.5 in | Wt 183.8 lb

## 2018-09-23 DIAGNOSIS — D539 Nutritional anemia, unspecified: Secondary | ICD-10-CM

## 2018-09-23 DIAGNOSIS — I5032 Chronic diastolic (congestive) heart failure: Secondary | ICD-10-CM | POA: Diagnosis not present

## 2018-09-23 DIAGNOSIS — D500 Iron deficiency anemia secondary to blood loss (chronic): Secondary | ICD-10-CM

## 2018-09-23 DIAGNOSIS — B354 Tinea corporis: Secondary | ICD-10-CM | POA: Diagnosis not present

## 2018-09-23 LAB — CBC WITH DIFFERENTIAL/PLATELET
Basophils Absolute: 0 10*3/uL (ref 0.0–0.1)
Basophils Relative: 0.4 % (ref 0.0–3.0)
Eosinophils Absolute: 0.4 10*3/uL (ref 0.0–0.7)
Eosinophils Relative: 5.6 % — ABNORMAL HIGH (ref 0.0–5.0)
HCT: 26.3 % — ABNORMAL LOW (ref 36.0–46.0)
Lymphocytes Relative: 10 % — ABNORMAL LOW (ref 12.0–46.0)
Lymphs Abs: 0.6 10*3/uL — ABNORMAL LOW (ref 0.7–4.0)
MCHC: 31.5 g/dL (ref 30.0–36.0)
MCV: 81.5 fl (ref 78.0–100.0)
Monocytes Absolute: 0.7 10*3/uL (ref 0.1–1.0)
Monocytes Relative: 11.2 % (ref 3.0–12.0)
Neutro Abs: 4.6 10*3/uL (ref 1.4–7.7)
Neutrophils Relative %: 72.8 % (ref 43.0–77.0)
Platelets: 174 10*3/uL (ref 150.0–400.0)
RBC: 3.23 Mil/uL — AB (ref 3.87–5.11)
RDW: 17.1 % — AB (ref 11.5–15.5)
WBC: 6.3 10*3/uL (ref 4.0–10.5)

## 2018-09-23 LAB — FERRITIN: Ferritin: 10 ng/mL (ref 10.0–291.0)

## 2018-09-23 LAB — IBC PANEL
IRON: 28 ug/dL — AB (ref 42–145)
Saturation Ratios: 5.9 % — ABNORMAL LOW (ref 20.0–50.0)
Transferrin: 339 mg/dL (ref 212.0–360.0)

## 2018-09-23 LAB — VITAMIN B12: Vitamin B-12: 1061 pg/mL — ABNORMAL HIGH (ref 211–911)

## 2018-09-23 LAB — FOLATE: Folate: >24 ng/mL (ref >5.9)

## 2018-09-23 MED ORDER — CICLOPIROX OLAMINE 0.77 % EX SUSP: CUTANEOUS | 3 refills | Status: DC

## 2018-09-23 NOTE — Progress Notes (Signed)
Subjective:  Patient ID: Holly Simmons, female    DOB: [birth year]  Age: 78 y.o. MRN: [id number]  CC: Rash   HPI Holly Simmons presents for f/up -she was seen by another provider about 2 weeks ago complaining of shortness of breath, weight gain, and lower extremity edema.  She has had a good response to the loop diuretic with weight loss, improved shortness of breath, and decreased lower extremity edema.  She was found to have a decline in her renal function and tells me 1 day after this visit she is seeing a nephrologist and in 4 days she is seeing her cardiologist.  Today she complains of a 2-day history of rash in her left groin that is uncomfortable and causes a burning sensation.  She has not treated it with any topical agents.   Outpatient Medications Prior to Visit  Medication Sig Dispense Refill  . arformoterol (BROVANA) 15 MCG/2ML NEBU Take 2 mLs (15 mcg total) by nebulization 2 (two) times daily. 120 mL 0  . Azilsartan Medoxomil (EDARBI) 40 MG TABS Take 1 tablet by mouth daily. 90 tablet 1  . B Complex-C (B-COMPLEX WITH VITAMIN C) tablet Take 1 tablet by mouth daily.    . Betamethasone Valerate 0.12 % foam APPLY TO SCALP QD (Patient taking differently: Apply 1 application topically once a week. Apply to scalp.) 100 g 2  . budesonide (PULMICORT) 0.25 MG/2ML nebulizer solution Take 2 mLs (0.25 mg total) by nebulization 2 (two) times daily. 60 mL 0  . buPROPion (WELLBUTRIN XL) 300 MG 24 hr tablet Take 1 tablet (300 mg total) by mouth daily. 90 tablet 1  . Cholecalciferol 2000 UNITS TABS Take 1 tablet (2,000 Units total) by mouth daily. 90 tablet 3  . famotidine (PEPCID) 40 MG tablet Take 1 tablet (40 mg total) by mouth daily. 90 tablet 1  . fexofenadine (ALLEGRA) 180 MG tablet Take 180 mg by mouth daily as needed for allergies.     . furosemide (LASIX) 40 MG tablet Take 1 tablet (40 mg total) by mouth 2 (two) times daily. 180 tablet 3  . ipratropium (ATROVENT) 0.03 %  nasal spray USE 2 SPRAYS IN EACH NOSTRIL 3 TIMES A DAY AS NEEDED. 30 mL 0  . lamoTRIgine (LAMICTAL) 25 MG tablet TAKE 1 TABLET ONCE DAILY. 90 tablet 1  . metoprolol tartrate (LOPRESSOR) 25 MG tablet TAKE 1 TABLET BY MOUTH TWICE DAILY. 180 tablet 1  . modafinil (PROVIGIL) 100 MG tablet Take 1 tablet (100 mg total) by mouth daily. 30 tablet 5  . potassium chloride (KLOR-CON 10) 10 MEQ tablet Take 1 tablet (10 mEq total) by mouth daily. 90 tablet 3  . PRESCRIPTION MEDICATION CPAP    . warfarin (COUMADIN) 2.5 MG tablet TAKE AS DIRECTED PER COUMADIN CLINIC. 40 tablet 0  . allopurinol (ZYLOPRIM) 100 MG tablet TAKE 100mg  TABLET BY MOUTH DAILY. 90 tablet 0  . ciclopirox (LOPROX) 0.77 % SUSP APPLY 1 PUMP TWICE A DAY. 60 mL 3  . SYMBICORT 80-4.5 MCG/ACT inhaler USE 2 PUFFS TWICE A DAY. (Patient taking differently: INHALE 2 PUFFS TWICE A DAY.) 10.2 g 5  . TRADJENTA 5 MG TABS tablet TAKE 1 TABLET BY MOUTH DAILY. 90 tablet 0  . Vilazodone HCl (VIIBRYD) 40 MG TABS TAKE 40mg  BY MOUTH DAILY. 90 tablet 1   No facility-administered medications prior to visit.     ROS Review of Systems  Constitutional: Negative.  Negative for appetite change, diaphoresis, fatigue and unexpected weight change.  HENT: Positive for nosebleeds.   Eyes: Negative for visual disturbance.  Respiratory: Positive for shortness of breath. Negative for chest tightness and wheezing.   Cardiovascular: Positive for leg swelling. Negative for chest pain and palpitations.  Gastrointestinal: Negative for abdominal pain, blood in stool, constipation, diarrhea, nausea and vomiting.  Endocrine: Negative.   Genitourinary: Negative.  Negative for difficulty urinating, dysuria and hematuria.  Musculoskeletal: Negative.  Negative for arthralgias, joint swelling and myalgias.  Skin: Positive for color change and rash. Negative for pallor and wound.  Neurological: Negative.  Negative for dizziness, weakness, light-headedness and headaches.   Hematological: Negative for adenopathy. Does not bruise/bleed easily.  Psychiatric/Behavioral: Negative.     Objective:  BP 140/62 (BP Location: Left Arm, Patient Position: Sitting, Cuff Size: Normal)   Pulse 69   Temp 97.8 F (36.6 C) (Oral)   Ht 5' 4.5" (1.638 m)   Wt 183 lb 12 oz (83.3 kg)   SpO2 93%   BMI 31.05 kg/m   BP Readings from Last 3 Encounters:  09/23/18 140/62  09/10/18 122/68  07/20/18 140/70    Wt Readings from Last 3 Encounters:  09/23/18 183 lb 12 oz (83.3 kg)  09/10/18 192 lb (87.1 kg)  07/20/18 182 lb (82.6 kg)    Physical Exam Constitutional:      Appearance: She is not ill-appearing or diaphoretic.  HENT:     Nose: Nose normal. No congestion.     Mouth/Throat:     Mouth: Mucous membranes are moist.     Pharynx: Oropharynx is clear. No oropharyngeal exudate or posterior oropharyngeal erythema.  Eyes:     General: No scleral icterus.    Conjunctiva/sclera: Conjunctivae normal.  Neck:     Musculoskeletal: Normal range of motion and neck supple.  Cardiovascular:     Rate and Rhythm: Normal rate and regular rhythm.     Heart sounds: Murmur present. Systolic murmur present with a grade of 1/6. No diastolic murmur. No gallop.   Pulmonary:     Effort: Pulmonary effort is normal. No tachypnea.     Breath sounds: Normal breath sounds. No wheezing or rhonchi.  Abdominal:     General: Bowel sounds are normal.     Palpations: There is no mass.     Tenderness: There is no abdominal tenderness. There is no guarding.    Musculoskeletal:     Right lower leg: 1+ Pitting Edema present.     Left lower leg: 1+ Pitting Edema present.  Skin:    General: Skin is warm.     Findings: Erythema and rash present.  Neurological:     General: No focal deficit present.     Mental Status: She is oriented to person, place, and time. Mental status is at baseline.     Lab Results  Component Value Date   WBC 6.3 09/23/2018   HGB 8.3 Repeated and verified X2. (L)  09/23/2018   HCT 26.3 Repeated and verified X2. (L) 09/23/2018   PLT 174.0 09/23/2018   GLUCOSE 88 09/10/2018   CHOL 125 11/17/2017   TRIG 73.0 11/17/2017   HDL 48.80 11/17/2017   LDLCALC 62 11/17/2017   ALT 11 02/19/2018   AST 24 02/19/2018   NA 141 09/10/2018   K 4.0 09/10/2018   CL 104 09/10/2018   CREATININE 2.08 (H) 09/10/2018   BUN 31 (H) 09/10/2018   CO2 28 09/10/2018   TSH 1.77 04/20/2018   INR 3.0 08/24/2018   HGBA1C 6.2 07/20/2018   MICROALBUR 63.9 (  H) 04/20/2018    US Renal  Result Date: 09/18/2018 CLINICAL DATA:  Worsening renal function.  Stage 3 kidney disease. EXAM: RENAL / URINARY TRACT ULTRASOUND COMPLETE COMPARISON:  CT, 03/30/2014. FINDINGS: Right Kidney: Renal measurements: 11.3 x 4.5 x 4.7 cm = volume: 127 mL. Increased renal parenchymal echogenicity. Diffuse cortical thinning. No renal masses, stones or hydronephrosis. Left Kidney: Renal measurements: 10.1 x 5.8 x 5.1 cm = volume: 159 mL. Borderline increased renal parenchymal echogenicity. Mild diffuse renal cortical thinning. No renal masses, stones or hydronephrosis. Bladder: Appears normal for degree of bladder distention. IMPRESSION: 1. No acute findings.  No hydronephrosis. 2. Findings of medical renal disease with increased right and borderline increased left renal parenchymal echogenicity and right greater than left renal cortical thinning. Electronically Signed   By: Lajean Manes M.D.   On: 09/18/2018 09:20    Assessment & Plan:   Petrona was seen today for rash.  Diagnoses and all orders for this visit:  Tinea of the body -     ciclopirox (LOPROX) 0.77 % SUSP; APPLY 1 PUMP TWICE A DAY.  Deficiency anemia- See below -     CBC with Differential/Platelet; Future -     Vitamin B12; Future -     IBC panel; Future -     Reticulocytes; Future -     Ferritin; Future -     Folate; Future -     Vitamin B1; Future  Iron deficiency anemia due to chronic blood loss-she has severe symptomatic iron  deficiency anemia secondary to chronic nosebleeds.  I recommended that she receive a series of iron infusions.   I am having Denice L. Peek maintain her fexofenadine, B-complex with vitamin C, Cholecalciferol, PRESCRIPTION MEDICATION, arformoterol, budesonide, Betamethasone Valerate, modafinil, Azilsartan Medoxomil, ipratropium, lamoTRIgine, warfarin, famotidine, buPROPion, metoprolol tartrate, furosemide, potassium chloride, and ciclopirox.  Meds ordered this encounter  Medications  . ciclopirox (LOPROX) 0.77 % SUSP    Sig: APPLY 1 PUMP TWICE A DAY.    Dispense:  60 mL    Refill:  3     Follow-up: Return in about 4 months (around 01/22/2019).  Scarlette Calico, MD

## 2018-09-23 NOTE — Patient Instructions (Signed)
Anemia  Anemia is a condition in which you do not have enough red blood cells or hemoglobin. Hemoglobin is a substance in red blood cells that carries oxygen. When you do not have enough red blood cells or hemoglobin (are anemic), your body cannot get enough oxygen and your organs may not work properly. As a result, you may feel very tired or have other problems. What are the causes? Common causes of anemia include:  Excessive bleeding. Anemia can be caused by excessive bleeding inside or outside the body, including bleeding from the intestine or from periods in women.  Poor nutrition.  Long-lasting (chronic) kidney, thyroid, and liver disease.  Bone marrow disorders.  Cancer and treatments for cancer.  HIV (human immunodeficiency virus) and AIDS (acquired immunodeficiency syndrome).  Treatments for HIV and AIDS.  Spleen problems.  Blood disorders.  Infections, medicines, and autoimmune disorders that destroy red blood cells. What are the signs or symptoms? Symptoms of this condition include:  Minor weakness.  Dizziness.  Headache.  Feeling heartbeats that are irregular or faster than normal (palpitations).  Shortness of breath, especially with exercise.  Paleness.  Cold sensitivity.  Indigestion.  Nausea.  Difficulty sleeping.  Difficulty concentrating. Symptoms may occur suddenly or develop slowly. If your anemia is mild, you may not have symptoms. How is this diagnosed? This condition is diagnosed based on:  Blood tests.  Your medical history.  A physical exam.  Bone marrow biopsy. Your health care provider may also check your stool (feces) for blood and may do additional testing to look for the cause of your bleeding. You may also have other tests, including:  Imaging tests, such as a CT scan or MRI.  Endoscopy.  Colonoscopy. How is this treated? Treatment for this condition depends on the cause. If you continue to lose a lot of blood, you may  need to be treated at a hospital. Treatment may include:  Taking supplements of iron, vitamin L37, or folic acid.  Taking a hormone medicine (erythropoietin) that can help to stimulate red blood cell growth.  Having a blood transfusion. This may be needed if you lose a lot of blood.  Making changes to your diet.  Having surgery to remove your spleen. Follow these instructions at home:  Take over-the-counter and prescription medicines only as told by your health care provider.  Take supplements only as told by your health care provider.  Follow any diet instructions that you were given.  Keep all follow-up visits as told by your health care provider. This is important. Contact a health care provider if:  You develop new bleeding anywhere in the body. Get help right away if:  You are very weak.  You are short of breath.  You have pain in your abdomen or chest.  You are dizzy or feel faint.  You have trouble concentrating.  You have bloody or black, tarry stools.  You vomit repeatedly or you vomit up blood. Summary  Anemia is a condition in which you do not have enough red blood cells or enough of a substance in your red blood cells that carries oxygen (hemoglobin).  Symptoms may occur suddenly or develop slowly.  If your anemia is mild, you may not have symptoms.  This condition is diagnosed with blood tests as well as a medical history and physical exam. Other tests may be needed.  Treatment for this condition depends on the cause of the anemia. This information is not intended to replace advice given to you by  your health care provider. Make sure you discuss any questions you have with your health care provider. Document Released: 08/28/2004 Document Revised: 08/22/2016 Document Reviewed: 08/22/2016 Elsevier Interactive Patient Education  2019 [employer].

## 2018-09-24 ENCOUNTER — Other Ambulatory Visit: Payer: Self-pay | Admitting: Internal Medicine

## 2018-09-24 DIAGNOSIS — M1000 Idiopathic gout, unspecified site: Secondary | ICD-10-CM

## 2018-09-25 NOTE — Assessment & Plan Note (Signed)
Improvement in symptoms noted with the loop diuretic. She will see cardiology within the next week.

## 2018-09-26 ENCOUNTER — Encounter: Payer: Self-pay | Admitting: Internal Medicine

## 2018-09-26 LAB — RETICULOCYTES
ABS Retic: 30400 cells/uL (ref 20000–8000)
RETIC CT PCT: 1 %

## 2018-09-26 LAB — VITAMIN B1: Vitamin B1 (Thiamine): 143 nmol/L — ABNORMAL HIGH (ref 8–30)

## 2018-09-28 ENCOUNTER — Telehealth: Payer: Self-pay | Admitting: Internal Medicine

## 2018-09-28 ENCOUNTER — Ambulatory Visit (INDEPENDENT_AMBULATORY_CARE_PROVIDER_SITE_OTHER): Payer: Medicare Other | Admitting: *Deleted

## 2018-09-28 DIAGNOSIS — I442 Atrioventricular block, complete: Secondary | ICD-10-CM | POA: Diagnosis not present

## 2018-09-28 NOTE — Telephone Encounter (Signed)
New message:    Patient calling concerning her Device, patient would like to know if her transmission  was received. Please call patient.

## 2018-09-28 NOTE — Telephone Encounter (Signed)
Spoke w/ pt and attempted to help her trouble shoot her home monitor. After several unsuccessful attempts I instructed pt to call tech support.

## 2018-09-29 ENCOUNTER — Ambulatory Visit (HOSPITAL_COMMUNITY)
Admission: RE | Admit: 2018-09-29 | Discharge: 2018-09-29 | Disposition: A | Discharge status: Home / Self Care | Payer: Medicare Other | Source: Ambulatory Visit | Attending: Internal Medicine | Admitting: Internal Medicine

## 2018-09-29 ENCOUNTER — Encounter: Payer: Medicare Other | Admitting: Nurse Practitioner

## 2018-09-29 DIAGNOSIS — D500 Iron deficiency anemia secondary to blood loss (chronic): Secondary | ICD-10-CM | POA: Diagnosis present

## 2018-09-29 LAB — CUP PACEART REMOTE DEVICE CHECK
Battery Impedance: 506 Ohm
Battery Voltage: 2.78 V
Brady Statistic AP VP Percent: 42 %
Brady Statistic AP VS Percent: 0 %
Brady Statistic AS VP Percent: 58 %
Brady Statistic AS VS Percent: 0 %
Date Time Interrogation Session: 20200225213839
Implantable Lead Implant Date: 20131210
Implantable Lead Implant Date: 20131210
Implantable Lead Location: 753859
Implantable Lead Model: 4296
Implantable Lead Model: 5076
Implantable Pulse Generator Implant Date: 20131210
Lead Channel Impedance Value: 430 Ohm
Lead Channel Impedance Value: 859 Ohm
Lead Channel Pacing Threshold Amplitude: 0.75 V
Lead Channel Pacing Threshold Amplitude: 0.875 V
Lead Channel Pacing Threshold Pulse Width: 0.4 ms
Lead Channel Pacing Threshold Pulse Width: 0.4 ms
Lead Channel Setting Pacing Pulse Width: 0.4 ms
Lead Channel Setting Sensing Sensitivity: 8 mV
MDC IDC LEAD LOCATION: 753858
MDC IDC MSMT BATTERY REMAINING LONGEVITY: 83 mo
MDC IDC SET LEADCHNL RA PACING AMPLITUDE: 2 V
MDC IDC SET LEADCHNL RV PACING AMPLITUDE: 2.5 V

## 2018-09-29 MED ORDER — SODIUM CHLORIDE 0.9 % IV SOLN
750.0000 mg | Freq: Once | INTRAVENOUS | Status: AC
  Administered 2018-09-29: 750 mg via INTRAVENOUS
  Filled 2018-09-29: qty 15

## 2018-09-29 MED ORDER — SODIUM CHLORIDE 0.9 % IV SOLN
INTRAVENOUS | Status: DC | PRN
  Administered 2018-09-29: 250 mL via INTRAVENOUS

## 2018-09-29 NOTE — Telephone Encounter (Signed)
Transmission received 09/28/18.

## 2018-09-29 NOTE — Discharge Instructions (Signed)

## 2018-09-29 NOTE — Progress Notes (Signed)
PATIENT CARE CENTER NOTE  Diagnosis: Iron Deficiency Anemia due to chronic blood loss   Provider: Scarlette Calico, MD   Procedure: IV Injectafer    Note: Patient received Injectafer infusion. Observed patient for 30 minutes post-infusion. Tolerated well with no adverse reaction. Vital signs stable. Discharge instructions given. Patient to come back next week for second infusion. Patient alert, oriented and ambulatory at discharge.

## 2018-10-05 ENCOUNTER — Ambulatory Visit (INDEPENDENT_AMBULATORY_CARE_PROVIDER_SITE_OTHER): Payer: Medicare Other | Admitting: General Practice

## 2018-10-05 DIAGNOSIS — Z7901 Long term (current) use of anticoagulants: Secondary | ICD-10-CM

## 2018-10-05 LAB — POCT INR: INR: 2.4 (ref 2.0–3.0)

## 2018-10-05 NOTE — Patient Instructions (Addendum)
Pre visit review using our clinic review tool, if applicable. No additional management support is needed unless otherwise documented below in the visit note.  Continue to take 1 tablet daily except 2 tablets on Tuesdays and Fridays.  Re-check in 6 weeks.  Increase greens 2 to 3 servings a week.

## 2018-10-05 NOTE — Progress Notes (Signed)
Remote pacemaker transmission.   

## 2018-10-06 ENCOUNTER — Ambulatory Visit (HOSPITAL_COMMUNITY)
Admission: RE | Admit: 2018-10-06 | Discharge: 2018-10-06 | Disposition: A | Discharge status: Home / Self Care | Payer: Medicare Other | Source: Ambulatory Visit | Attending: Internal Medicine | Admitting: Internal Medicine

## 2018-10-06 DIAGNOSIS — D509 Iron deficiency anemia, unspecified: Secondary | ICD-10-CM | POA: Diagnosis present

## 2018-10-06 MED ORDER — SODIUM CHLORIDE 0.9 % IV SOLN
INTRAVENOUS | Status: DC | PRN
  Administered 2018-10-06: 250 mL via INTRAVENOUS

## 2018-10-06 MED ORDER — SODIUM CHLORIDE 0.9 % IV SOLN
750.0000 mg | Freq: Once | INTRAVENOUS | Status: AC
  Administered 2018-10-06: 750 mg via INTRAVENOUS
  Filled 2018-10-06: qty 15

## 2018-10-06 NOTE — Progress Notes (Signed)
PATIENT CARE CENTER NOTE   DIAGNOSIS: Iron Deficiency Anemia      PROVIDER: Scarlette Calico, MD    PROCEDURE: IV Injectafer      NOTE: [facility] presented [facility] day hospital for IV Injectafer infusion. No complications with infusion. Observed pt 30 min post-infusion. No adverse reactions noted post infusion. Vital signs stable. AVS given. Pt Alert, Oriented, and ambulatory at discharge.

## 2018-10-25 ENCOUNTER — Other Ambulatory Visit: Payer: Self-pay | Admitting: Internal Medicine

## 2018-10-25 DIAGNOSIS — I5032 Chronic diastolic (congestive) heart failure: Secondary | ICD-10-CM

## 2018-10-25 DIAGNOSIS — R809 Proteinuria, unspecified: Principal | ICD-10-CM

## 2018-10-25 DIAGNOSIS — E1129 Type 2 diabetes mellitus with other diabetic kidney complication: Secondary | ICD-10-CM

## 2018-11-09 ENCOUNTER — Ambulatory Visit (INDEPENDENT_AMBULATORY_CARE_PROVIDER_SITE_OTHER): Payer: Medicare Other | Admitting: General Practice

## 2018-11-09 DIAGNOSIS — I4892 Unspecified atrial flutter: Secondary | ICD-10-CM

## 2018-11-09 DIAGNOSIS — Z7901 Long term (current) use of anticoagulants: Secondary | ICD-10-CM

## 2018-11-09 LAB — POCT INR: INR: 3.3 — AB (ref 2.0–3.0)

## 2018-11-09 NOTE — Patient Instructions (Addendum)
Pre visit review using our clinic review tool, if applicable. No additional management support is needed unless otherwise documented below in the visit note.  Please take 1 tablet today and then continue to take 1 tablet daily except 2 tablets on Tuesdays and Fridays.  Re-check in 4 weeks.  Increase greens 2 to 3 servings a week. On days you take 2 tablets, please take them both at the same time.

## 2018-11-17 ENCOUNTER — Encounter: Payer: Self-pay | Admitting: Internal Medicine

## 2018-11-22 ENCOUNTER — Telehealth: Payer: Medicare Other | Admitting: Internal Medicine

## 2018-11-23 ENCOUNTER — Encounter: Payer: Self-pay | Admitting: Internal Medicine

## 2018-11-23 ENCOUNTER — Other Ambulatory Visit: Payer: Self-pay

## 2018-11-23 ENCOUNTER — Ambulatory Visit: Payer: Medicare Other | Admitting: Internal Medicine

## 2018-11-23 ENCOUNTER — Other Ambulatory Visit (INDEPENDENT_AMBULATORY_CARE_PROVIDER_SITE_OTHER): Payer: Medicare Other

## 2018-11-23 VITALS — BP 112/60 | HR 74 | Temp 97.7°F | Ht 64.5 in | Wt 154.0 lb

## 2018-11-23 DIAGNOSIS — E118 Type 2 diabetes mellitus with unspecified complications: Secondary | ICD-10-CM | POA: Diagnosis not present

## 2018-11-23 DIAGNOSIS — D500 Iron deficiency anemia secondary to blood loss (chronic): Secondary | ICD-10-CM | POA: Diagnosis not present

## 2018-11-23 DIAGNOSIS — N185 Chronic kidney disease, stage 5: Secondary | ICD-10-CM

## 2018-11-23 LAB — BASIC METABOLIC PANEL
BUN: 73 mg/dL — ABNORMAL HIGH (ref 6–23)
CO2: 25 mEq/L (ref 19–32)
Calcium: 10.9 mg/dL — ABNORMAL HIGH (ref 8.4–10.5)
Chloride: 103 mEq/L (ref 96–112)
Creatinine, Ser: 4.09 mg/dL — ABNORMAL HIGH (ref 0.40–1.20)
GFR: 10.63 mL/min — CL (ref >60.00)
Glucose, Bld: 75 mg/dL (ref 70–99)
Potassium: 5.1 mEq/L (ref 3.5–5.1)
Sodium: 137 mEq/L (ref 135–145)

## 2018-11-23 LAB — IBC PANEL
Iron: 68 ug/dL (ref 42–145)
Saturation Ratios: 25 % (ref 20.0–50.0)
Transferrin: 194 mg/dL — ABNORMAL LOW (ref 212.0–360.0)

## 2018-11-23 LAB — CBC WITH DIFFERENTIAL/PLATELET
Basophils Absolute: 0.1 10*3/uL (ref 0.0–0.1)
Basophils Relative: 0.8 % (ref 0.0–3.0)
Eosinophils Absolute: 0.4 10*3/uL (ref 0.0–0.7)
Eosinophils Relative: 6.3 % — ABNORMAL HIGH (ref 0.0–5.0)
HCT: 37.3 % (ref 36.0–46.0)
Hemoglobin: 12 g/dL (ref 12.0–15.0)
Lymphocytes Relative: 14.1 % (ref 12.0–46.0)
Lymphs Abs: 0.9 10*3/uL (ref 0.7–4.0)
MCHC: 32.2 g/dL (ref 30.0–36.0)
MCV: 90.2 fl (ref 78.0–100.0)
Monocytes Absolute: 0.7 10*3/uL (ref 0.1–1.0)
Monocytes Relative: 10.9 % (ref 3.0–12.0)
Neutro Abs: 4.4 10*3/uL (ref 1.4–7.7)
Neutrophils Relative %: 67.9 % (ref 43.0–77.0)
Platelets: 122 10*3/uL — ABNORMAL LOW (ref 150.0–400.0)
RBC: 4.14 Mil/uL (ref 3.87–5.11)
RDW: 21.6 % — ABNORMAL HIGH (ref 11.5–15.5)
WBC: 6.5 10*3/uL (ref 4.0–10.5)

## 2018-11-23 LAB — HEMOGLOBIN A1C: Hgb A1c MFr Bld: 6 % (ref 4.6–6.5)

## 2018-11-23 LAB — FERRITIN: Ferritin: 537.2 ng/mL — ABNORMAL HIGH (ref 10.0–291.0)

## 2018-11-23 MED ORDER — FUSION PLUS PO CAPS: 1.0000 | ORAL_CAPSULE | Freq: Every day | ORAL | 1 refills | Status: DC

## 2018-11-23 NOTE — Progress Notes (Signed)
Subjective:  Patient ID: Holly Simmons, female    DOB: 02/09/1941  Age: 78 y.o. MRN: [id number]  CC: Anemia and Diabetes   HPI Holly Simmons presents for f/up - She feels well today and offers no complaints.  She has received 2 iron infusions and says that her fatigue is better.  She denies abdominal pain, blood loss, paresthesias, chest pain, shortness of breath, dizziness, lightheadedness, nausea, or vomiting.  Outpatient Medications Prior to Visit  Medication Sig Dispense Refill  . allopurinol (ZYLOPRIM) 100 MG tablet TAKE 1 TABLET BY MOUTH DAILY. 90 tablet 0  . arformoterol (BROVANA) 15 MCG/2ML NEBU Take 2 mLs (15 mcg total) by nebulization 2 (two) times daily. 120 mL 0  . B Complex-C (B-COMPLEX WITH VITAMIN C) tablet Take 1 tablet by mouth daily.    . Betamethasone Valerate 0.12 % foam APPLY TO SCALP QD (Patient taking differently: Apply 1 application topically once a week. Apply to scalp.) 100 g 2  . budesonide (PULMICORT) 0.25 MG/2ML nebulizer solution Take 2 mLs (0.25 mg total) by nebulization 2 (two) times daily. 60 mL 0  . budesonide-formoterol (SYMBICORT) 80-4.5 MCG/ACT inhaler Inhale 2 puffs into the lungs 2 (two) times daily. 30.6 g 1  . buPROPion (WELLBUTRIN XL) 300 MG 24 hr tablet Take 1 tablet (300 mg total) by mouth daily. 90 tablet 1  . Cholecalciferol 2000 UNITS TABS Take 1 tablet (2,000 Units total) by mouth daily. 90 tablet 3  . ciclopirox (LOPROX) 0.77 % SUSP APPLY 1 PUMP TWICE A DAY. 60 mL 3  . EDARBI 40 MG TABS TAKE 1 TABLET ONCE DAILY. 90 tablet 0  . famotidine (PEPCID) 40 MG tablet Take 1 tablet (40 mg total) by mouth daily. 90 tablet 1  . fexofenadine (ALLEGRA) 180 MG tablet Take 180 mg by mouth daily as needed for allergies.     . furosemide (LASIX) 40 MG tablet Take 1 tablet (40 mg total) by mouth 2 (two) times daily. 180 tablet 3  . ipratropium (ATROVENT) 0.03 % nasal spray USE 2 SPRAYS IN EACH NOSTRIL 3 TIMES A DAY AS NEEDED. 30 mL 0  .  lamoTRIgine (LAMICTAL) 25 MG tablet TAKE 1 TABLET ONCE DAILY. 90 tablet 1  . metoprolol tartrate (LOPRESSOR) 25 MG tablet TAKE 1 TABLET BY MOUTH TWICE DAILY. 180 tablet 1  . modafinil (PROVIGIL) 100 MG tablet Take 1 tablet (100 mg total) by mouth daily. 30 tablet 5  . potassium chloride (KLOR-CON 10) 10 MEQ tablet Take 1 tablet (10 mEq total) by mouth daily. 90 tablet 3  . PRESCRIPTION MEDICATION CPAP    . TRADJENTA 5 MG TABS tablet TAKE 1 TABLET BY MOUTH DAILY. 90 tablet 1  . VIIBRYD 40 MG TABS TAKE 1 TABLET ONCE DAILY. 90 tablet 1  . warfarin (COUMADIN) 2.5 MG tablet TAKE AS DIRECTED PER COUMADIN CLINIC. 40 tablet 0   No facility-administered medications prior to visit.     ROS Review of Systems  Constitutional: Positive for fatigue. Negative for appetite change, chills, diaphoresis and unexpected weight change.  HENT: Negative.  Negative for sore throat and trouble swallowing.   Eyes: Negative for visual disturbance.  Respiratory: Negative for cough, chest tightness, shortness of breath and wheezing.   Cardiovascular: Negative for chest pain, palpitations and leg swelling.  Gastrointestinal: Negative for abdominal pain, constipation, diarrhea, nausea and vomiting.  Genitourinary: Negative.  Negative for difficulty urinating and dysuria.  Musculoskeletal: Negative.  Negative for arthralgias, back pain, myalgias and neck pain.  Skin:  Negative.  Negative for color change and pallor.  Neurological: Negative.  Negative for dizziness, weakness, light-headedness and numbness.  Hematological: Negative for adenopathy. Does not bruise/bleed easily.  Psychiatric/Behavioral: Negative.     Objective:  BP 112/60 (BP Location: Left Arm, Patient Position: Sitting, Cuff Size: Normal)   Pulse 74   Temp 97.7 F (36.5 C) (Oral)   Ht 5' 4.5" (1.638 m)   Wt 154 lb (69.9 kg)   SpO2 97%   BMI 26.03 kg/m   BP Readings from Last 3 Encounters:  11/23/18 112/60  10/06/18 127/60  09/29/18 (!) 124/44     Wt Readings from Last 3 Encounters:  11/23/18 154 lb (69.9 kg)  09/23/18 183 lb 12 oz (83.3 kg)  09/10/18 192 lb (87.1 kg)    Physical Exam Vitals signs reviewed.  Constitutional:      Appearance: She is not ill-appearing or diaphoretic.  HENT:     Nose: Nose normal. No congestion.     Mouth/Throat:     Mouth: Mucous membranes are moist.     Pharynx: Oropharynx is clear. No oropharyngeal exudate or posterior oropharyngeal erythema.  Eyes:     General: No scleral icterus.    Conjunctiva/sclera: Conjunctivae normal.  Neck:     Musculoskeletal: Normal range of motion and neck supple. No muscular tenderness.  Cardiovascular:     Rate and Rhythm: Normal rate and regular rhythm.     Heart sounds: No murmur. No gallop.   Pulmonary:     Effort: Pulmonary effort is normal. No respiratory distress.     Breath sounds: No stridor. No wheezing, rhonchi or rales.  Abdominal:     General: Abdomen is flat. Bowel sounds are normal.     Palpations: There is no hepatomegaly, splenomegaly or mass.     Tenderness: There is no abdominal tenderness. There is no guarding.  Musculoskeletal:     Right lower leg: No edema.     Left lower leg: No edema.  Lymphadenopathy:     Cervical: No cervical adenopathy.  Skin:    General: Skin is warm and dry.     Coloration: Skin is not pale.  Neurological:     General: No focal deficit present.     Mental Status: She is oriented to person, place, and time.  Psychiatric:        Mood and Affect: Mood normal.     Lab Results  Component Value Date   WBC 6.5 11/23/2018   HGB 12.0 11/23/2018   HCT 37.3 11/23/2018   PLT 122.0 (L) 11/23/2018   GLUCOSE 75 11/23/2018   CHOL 125 11/17/2017   TRIG 73.0 11/17/2017   HDL 48.80 11/17/2017   LDLCALC 62 11/17/2017   ALT 11 02/19/2018   AST 24 02/19/2018   NA 137 11/23/2018   K 5.1 11/23/2018   CL 103 11/23/2018   CREATININE 4.09 (H) 11/23/2018   BUN 73 (H) 11/23/2018   CO2 25 11/23/2018   TSH 1.77  04/20/2018   INR 3.3 (A) 11/09/2018   HGBA1C 6.0 11/23/2018   MICROALBUR 63.9 (H) 04/20/2018    No results found.  Assessment & Plan:   Holly Simmons was seen today for anemia and diabetes.  Diagnoses and all orders for this visit:  Iron deficiency anemia due to chronic blood loss- Her H&H and iron levels are normal now.  I have asked her maintain a normal iron level with a daily dose of Fusion Plus. -     CBC with Differential/Platelet; Future -  IBC panel; Future -     Ferritin; Future -     Iron-FA-B Cmp-C-Biot-Probiotic (FUSION PLUS) CAPS; Take 1 capsule by mouth daily.  Type II diabetes mellitus with manifestations ([place])- Her A1c is at 6.0%.  Her blood sugars are adequately well controlled.  I have asked her to stop taking Tradjenta. -     Basic metabolic panel; Future -     Hemoglobin A1c; Future -     HM Diabetes Foot Exam  CKD (chronic kidney disease) stage 5, GFR less than 15 ml/min (HCC)- Her BUN is up to 73, creatinine is up to 4.09, GFR is down to 10.  She is prerenal with worsening renal function.  I have asked her to decrease her furosemide dose by 50% and to discontinue the [place].  I have also asked her to touch base with her nephrologist as soon as possible.   I am having Holly Simmons start on Fusion Plus. I am also having her maintain her fexofenadine, B-complex with vitamin C, Cholecalciferol, PRESCRIPTION MEDICATION, arformoterol, budesonide, Betamethasone Valerate, modafinil, ipratropium, lamoTRIgine, famotidine, buPROPion, metoprolol tartrate, furosemide, potassium chloride, ciclopirox, allopurinol, Viibryd, Tradjenta, budesonide-formoterol, Edarbi, and warfarin.  Meds ordered this encounter  Medications  . Iron-FA-B Cmp-C-Biot-Probiotic (FUSION PLUS) CAPS    Sig: Take 1 capsule by mouth daily.    Dispense:  90 capsule    Refill:  1     Follow-up: Return in about 4 months (around 03/25/2019).  Holly Calico, MD

## 2018-11-23 NOTE — Patient Instructions (Signed)
Iron Deficiency Anemia, Adult  Iron deficiency anemia is a condition in which the concentration of red blood cells or hemoglobin in the blood is below normal because of too little iron. Hemoglobin is a substance in red blood cells that carries oxygen to the body's tissues. When the concentration of red blood cells or hemoglobin is too low, not enough oxygen reaches these tissues.  Iron deficiency anemia is usually long-lasting (chronic) and it develops over time. It may or may not cause symptoms. It is a common type of anemia.  What are the causes?  This condition may be caused by:   Not enough iron in the diet.   Blood loss caused by bleeding in the intestine.   Blood loss from a gastrointestinal condition like Crohn disease.   Frequent blood draws, such as from blood donation.   Abnormal absorption in the gut.   Heavy menstrual periods in women.   Cancers of the gastrointestinal system, such as colon cancer.  What are the signs or symptoms?  Symptoms of this condition may include:   Fatigue.   Headache.   Pale skin, lips, and nail beds.   Poor appetite.   Weakness.   Shortness of breath.   Dizziness.   Cold hands and feet.   Fast or irregular heartbeat.   Irritability. This is more common in severe anemia.   Rapid breathing. This is more common in severe anemia.  Mild anemia may not cause any symptoms.  How is this diagnosed?  This condition is diagnosed based on:   Your medical history.   A physical exam.   Blood tests.  You may have additional tests to find the underlying cause of your anemia, such as:   Testing for blood in the stool (fecal occult blood test).   A procedure to see inside your colon and rectum (colonoscopy).   A procedure to see inside your esophagus and stomach (endoscopy).   A test in which cells are removed from bone marrow (bone marrow aspiration) or fluid is removed from the bone marrow to be examined (biopsy). This is rarely needed.  How is this treated?  This  condition is treated by correcting the cause of your iron deficiency. Treatment may involve:   Adding iron-rich foods to your diet.   Taking iron supplements. If you are pregnant or breastfeeding, you may need to take extra iron because your normal diet usually does not provide the amount of iron that you need.   Increasing vitamin C intake. Vitamin C helps your body absorb iron. Your health care provider may recommend that you take iron supplements along with a glass of orange juice or a vitamin C supplement.   Medicines to make heavy menstrual flow lighter.   Surgery.  You may need repeat blood tests to determine whether treatment is working. Depending on the underlying cause, the anemia should be corrected within 2 months of starting treatment. If the treatment does not seem to be working, you may need more testing.  Follow these instructions at home:  Medicines   Take over-the-counter and prescription medicines only as told by your health care provider. This includes iron supplements and vitamins.   If you cannot tolerate taking iron supplements by mouth, talk with your health care provider about taking them through a vein (intravenously) or an injection into a muscle.   For the best iron absorption, you should take iron supplements when your stomach is empty. If you cannot tolerate them on an empty stomach,   you may need to take them with food.   Do not drink milk or take antacids at the same time as your iron supplements. Milk and antacids may interfere with iron absorption.   Iron supplements can cause constipation. To prevent constipation, include fiber in your diet as told by your health care provider. A stool softener may also be recommended.  Eating and drinking     Talk with your health care provider before changing your diet. He or she may recommend that you eat foods that contain a lot of iron, such as:  ? Liver.  ? Low-fat (lean) beef.  ? Breads and cereals that have iron added to them (are  fortified).  ? Eggs.  ? Dried fruit.  ? Dark green, leafy vegetables.   To help your body use the iron from iron-rich foods, eat those foods at the same time as fresh fruits and vegetables that are high in vitamin C. Foods that are high in vitamin C include:  ? Oranges.  ? Peppers.  ? Tomatoes.  ? Mangoes.   Drinkenoughfluid to keep your urine clear or pale yellow.  General instructions   Return to your normal activities as told by your health care provider. Ask your health care provider what activities are safe for you.   Practice good hygiene. Anemia can make you more prone to illness and infection.   Keep all follow-up visits as told by your health care provider. This is important.  Contact a health care provider if:   You feel nauseous or you vomit.   You feel weak.   You have unexplained sweating.   You develop symptoms of constipation, such as:  ? Having fewer than three bowel movements a week.  ? Straining to have a bowel movement.  ? Having stools that are hard, dry, or larger than normal.  ? Feeling full or bloated.  ? Pain in the lower abdomen.  ? Not feeling relief after having a bowel movement.  Get help right away if:   You faint. If this happens, do not drive yourself to the hospital. Call your local emergency services (911 in the [place]   You have chest pain.   You have shortness of breath that:  ? Is severe.  ? Gets worse with physical activity.   You have a rapid heartbeat.   You become light-headed when getting up from a sitting or lying down position.  This information is not intended to replace advice given to you by your health care provider. Make sure you discuss any questions you have with your health care provider.  Document Released: 07/18/2000 Document Revised: 04/09/2016 Document Reviewed: 04/09/2016  [employer]vier Interactive Patient Education  2019 [employer]

## 2018-11-24 ENCOUNTER — Other Ambulatory Visit: Payer: Self-pay | Admitting: Internal Medicine

## 2018-11-24 ENCOUNTER — Encounter: Payer: Self-pay | Admitting: Internal Medicine

## 2018-11-24 DIAGNOSIS — F418 Other specified anxiety disorders: Secondary | ICD-10-CM

## 2018-12-07 ENCOUNTER — Ambulatory Visit (INDEPENDENT_AMBULATORY_CARE_PROVIDER_SITE_OTHER): Payer: Medicare Other | Admitting: General Practice

## 2018-12-07 DIAGNOSIS — Z7901 Long term (current) use of anticoagulants: Secondary | ICD-10-CM | POA: Diagnosis not present

## 2018-12-07 LAB — POCT INR: INR: 4.4 — AB (ref 2.0–3.0)

## 2018-12-07 NOTE — Patient Instructions (Addendum)
Pre visit review using our clinic review tool, if applicable. No additional management support is needed unless otherwise documented below in the visit note.  Hold coumadin today and tomorrow and then change dosage and take 1 tablet daily except 2 tablets on Fridays only.  Re-check in 3 weeks.  On days you take 2 tablets, please take both at the same time.  Call patient's son [name] to verify what patient has been taking.[phone].

## 2018-12-15 ENCOUNTER — Encounter: Payer: Self-pay | Admitting: Internal Medicine

## 2018-12-22 ENCOUNTER — Other Ambulatory Visit: Payer: Self-pay | Admitting: Internal Medicine

## 2018-12-22 DIAGNOSIS — K219 Gastro-esophageal reflux disease without esophagitis: Secondary | ICD-10-CM

## 2018-12-22 DIAGNOSIS — M1000 Idiopathic gout, unspecified site: Secondary | ICD-10-CM

## 2018-12-28 ENCOUNTER — Ambulatory Visit (INDEPENDENT_AMBULATORY_CARE_PROVIDER_SITE_OTHER): Payer: Medicare Other | Admitting: General Practice

## 2018-12-28 ENCOUNTER — Other Ambulatory Visit: Payer: Self-pay

## 2018-12-28 DIAGNOSIS — Z7901 Long term (current) use of anticoagulants: Secondary | ICD-10-CM

## 2018-12-28 LAB — POCT INR: INR: 6 — AB (ref 2.0–3.0)

## 2018-12-28 NOTE — Patient Instructions (Addendum)
Pre visit review using our clinic review tool, if applicable. No additional management support is needed unless otherwise documented below in the visit note.  Hold coumadin through Saturday.  On Sunday and Sunday take 2.5 mg. Re-check in 1 week.    Spoke with patient's son [name] to verify dosage. [phone].  Encouraged patient to go to ER if any unusual bleeding occurs.  Pt verbalized understanding.

## 2018-12-29 ENCOUNTER — Ambulatory Visit (INDEPENDENT_AMBULATORY_CARE_PROVIDER_SITE_OTHER): Payer: Medicare Other | Admitting: *Deleted

## 2018-12-29 DIAGNOSIS — I442 Atrioventricular block, complete: Secondary | ICD-10-CM | POA: Diagnosis not present

## 2018-12-29 LAB — CUP PACEART REMOTE DEVICE CHECK
Battery Impedance: 581 Ohm
Battery Remaining Longevity: 79 mo
Battery Voltage: 2.79 V
Brady Statistic AP VP Percent: 41 %
Brady Statistic AP VS Percent: 0 %
Brady Statistic AS VP Percent: 59 %
Brady Statistic AS VS Percent: 0 %
Date Time Interrogation Session: 20200527122914
Implantable Lead Implant Date: 20131210
Implantable Lead Implant Date: 20131210
Implantable Lead Location: 753858
Implantable Lead Location: 753859
Implantable Lead Model: 4296
Implantable Lead Model: 5076
Implantable Pulse Generator Implant Date: 20131210
Lead Channel Impedance Value: 467 Ohm
Lead Channel Impedance Value: 941 Ohm
Lead Channel Pacing Threshold Amplitude: 0.875 V
Lead Channel Pacing Threshold Amplitude: 1.125 V
Lead Channel Pacing Threshold Pulse Width: 0.4 ms
Lead Channel Pacing Threshold Pulse Width: 0.4 ms
Lead Channel Setting Pacing Amplitude: 2 V
Lead Channel Setting Pacing Amplitude: 2.5 V
Lead Channel Setting Pacing Pulse Width: 0.4 ms
Lead Channel Setting Sensing Sensitivity: 8 mV

## 2019-01-04 ENCOUNTER — Ambulatory Visit (INDEPENDENT_AMBULATORY_CARE_PROVIDER_SITE_OTHER): Payer: Medicare Other | Admitting: General Practice

## 2019-01-04 ENCOUNTER — Ambulatory Visit: Payer: Medicare Other | Admitting: Internal Medicine

## 2019-01-04 ENCOUNTER — Other Ambulatory Visit: Payer: Self-pay

## 2019-01-04 DIAGNOSIS — Z7901 Long term (current) use of anticoagulants: Secondary | ICD-10-CM | POA: Diagnosis not present

## 2019-01-04 LAB — POCT INR: INR: 1.4 — AB (ref 2.0–3.0)

## 2019-01-04 NOTE — Patient Instructions (Addendum)
Pre visit review using our clinic review tool, if applicable. No additional management support is needed unless otherwise documented below in the visit note.  Take 2 tablets today and tomorrow (6/2 and 6/3) and then start taking 1 tablet daily.  Re-check in 10 to 14 days. Spoke with patient's son [name] to verify dosage. [phone].  Encouraged patient to go to ER if any unusual bleeding occurs.  Pt verbalized understanding.

## 2019-01-06 ENCOUNTER — Encounter: Payer: Self-pay | Admitting: Cardiology

## 2019-01-06 NOTE — Progress Notes (Signed)
Remote pacemaker transmission.   

## 2019-01-21 ENCOUNTER — Ambulatory Visit: Payer: Medicare Other

## 2019-01-24 ENCOUNTER — Other Ambulatory Visit: Payer: Self-pay | Admitting: Internal Medicine

## 2019-02-08 ENCOUNTER — Ambulatory Visit (INDEPENDENT_AMBULATORY_CARE_PROVIDER_SITE_OTHER): Payer: Medicare Other | Admitting: General Practice

## 2019-02-08 ENCOUNTER — Other Ambulatory Visit: Payer: Self-pay

## 2019-02-08 ENCOUNTER — Ambulatory Visit: Payer: Medicare Other

## 2019-02-08 DIAGNOSIS — Z7901 Long term (current) use of anticoagulants: Secondary | ICD-10-CM

## 2019-02-08 DIAGNOSIS — I4892 Unspecified atrial flutter: Secondary | ICD-10-CM | POA: Diagnosis not present

## 2019-02-08 LAB — POCT INR: INR: 2 (ref 2.0–3.0)

## 2019-02-08 NOTE — Patient Instructions (Addendum)
Pre visit review using our clinic review tool, if applicable. No additional management support is needed unless otherwise documented below in the visit note.  Continue to take 1 (2.5 mg) tablet daily.  Re-check in 4 weeks.

## 2019-02-15 ENCOUNTER — Encounter: Payer: Self-pay | Admitting: Internal Medicine

## 2019-02-15 ENCOUNTER — Other Ambulatory Visit: Payer: Self-pay

## 2019-02-15 ENCOUNTER — Other Ambulatory Visit (INDEPENDENT_AMBULATORY_CARE_PROVIDER_SITE_OTHER): Payer: Medicare Other

## 2019-02-15 ENCOUNTER — Ambulatory Visit (INDEPENDENT_AMBULATORY_CARE_PROVIDER_SITE_OTHER): Payer: Medicare Other | Admitting: Internal Medicine

## 2019-02-15 VITALS — BP 130/70 | HR 94 | Temp 98.3°F | Ht 64.5 in | Wt 140.5 lb

## 2019-02-15 DIAGNOSIS — M80071S Age-related osteoporosis with current pathological fracture, right ankle and foot, sequela: Secondary | ICD-10-CM

## 2019-02-15 DIAGNOSIS — Z0000 Encounter for general adult medical examination without abnormal findings: Secondary | ICD-10-CM | POA: Diagnosis not present

## 2019-02-15 DIAGNOSIS — I480 Paroxysmal atrial fibrillation: Secondary | ICD-10-CM

## 2019-02-15 DIAGNOSIS — E785 Hyperlipidemia, unspecified: Secondary | ICD-10-CM | POA: Diagnosis not present

## 2019-02-15 DIAGNOSIS — D500 Iron deficiency anemia secondary to blood loss (chronic): Secondary | ICD-10-CM | POA: Diagnosis not present

## 2019-02-15 DIAGNOSIS — E118 Type 2 diabetes mellitus with unspecified complications: Secondary | ICD-10-CM | POA: Diagnosis not present

## 2019-02-15 LAB — CBC WITH DIFFERENTIAL/PLATELET
Basophils Absolute: 0 10*3/uL (ref 0.0–0.1)
Basophils Relative: 0.6 % (ref 0.0–3.0)
Eosinophils Absolute: 0.2 10*3/uL (ref 0.0–0.7)
Eosinophils Relative: 3.9 % (ref 0.0–5.0)
HCT: 35.9 % — ABNORMAL LOW (ref 36.0–46.0)
Hemoglobin: 11.7 g/dL — ABNORMAL LOW (ref 12.0–15.0)
Lymphocytes Relative: 13 % (ref 12.0–46.0)
Lymphs Abs: 0.8 10*3/uL (ref 0.7–4.0)
MCHC: 32.5 g/dL (ref 30.0–36.0)
MCV: 97 fl (ref 78.0–100.0)
Monocytes Absolute: 0.6 10*3/uL (ref 0.1–1.0)
Monocytes Relative: 10.5 % (ref 3.0–12.0)
Neutro Abs: 4.5 10*3/uL (ref 1.4–7.7)
Neutrophils Relative %: 72 % (ref 43.0–77.0)
Platelets: 190 10*3/uL (ref 150.0–400.0)
RBC: 3.7 Mil/uL — ABNORMAL LOW (ref 3.87–5.11)
RDW: 15.8 % — ABNORMAL HIGH (ref 11.5–15.5)
WBC: 6.2 10*3/uL (ref 4.0–10.5)

## 2019-02-15 LAB — FERRITIN: Ferritin: 215.6 ng/mL (ref 10.0–291.0)

## 2019-02-15 LAB — LIPID PANEL
Cholesterol: 148 mg/dL (ref 0–200)
HDL: 58.1 mg/dL (ref >39.00)
LDL Cholesterol: 74 mg/dL (ref 0–99)
NonHDL: 89.63
Total CHOL/HDL Ratio: 3
Triglycerides: 80 mg/dL (ref 0.0–149.0)
VLDL: 16 mg/dL (ref 0.0–40.0)

## 2019-02-15 LAB — IBC PANEL
Iron: 47 ug/dL (ref 42–145)
Saturation Ratios: 15.6 % — ABNORMAL LOW (ref 20.0–50.0)
Transferrin: 215 mg/dL (ref 212.0–360.0)

## 2019-02-15 LAB — HEMOGLOBIN A1C: Hgb A1c MFr Bld: 5.8 % (ref 4.6–6.5)

## 2019-02-15 LAB — TSH: TSH: 2.56 u[IU]/mL (ref 0.35–4.50)

## 2019-02-15 MED ORDER — DENOSUMAB 60 MG/ML ~~LOC~~ SOSY
60.0000 mg | PREFILLED_SYRINGE | Freq: Once | SUBCUTANEOUS | Status: AC
  Administered 2019-02-15: 60 mg via SUBCUTANEOUS

## 2019-02-15 NOTE — Progress Notes (Signed)
Subjective:  Patient ID: Holly Simmons, female    DOB: 05-09-1941  Age: 78 y.o. MRN: [id number]  CC: Annual Exam, Hyperlipidemia, Diabetes, Hypertension, and Atrial Fibrillation   HPI [name] presents for a CPX.  She is followed closely by nephrology and has been told that she needs to prepare to start hemodialysis.  She has an appointment with the nephrologist in about a month to get things started.  She feels well today and offers no complaints.  Outpatient Medications Prior to Visit  Medication Sig Dispense Refill  . allopurinol (ZYLOPRIM) 100 MG tablet TAKE 1 TABLET BY MOUTH DAILY. 90 tablet 1  . arformoterol (BROVANA) 15 MCG/2ML NEBU Take 2 mLs (15 mcg total) by nebulization 2 (two) times daily. 120 mL 0  . B Complex-C (B-COMPLEX WITH VITAMIN C) tablet Take 1 tablet by mouth daily.    . Betamethasone Valerate 0.12 % foam APPLY TO SCALP QD (Patient taking differently: Apply 1 application topically once a week. Apply to scalp.) 100 g 2  . budesonide (PULMICORT) 0.25 MG/2ML nebulizer solution Take 2 mLs (0.25 mg total) by nebulization 2 (two) times daily. 60 mL 0  . budesonide-formoterol (SYMBICORT) 80-4.5 MCG/ACT inhaler Inhale 2 puffs into the lungs 2 (two) times daily. 30.6 g 1  . buPROPion (WELLBUTRIN XL) 300 MG 24 hr tablet Take 1 tablet (300 mg total) by mouth daily. 90 tablet 1  . Cholecalciferol 2000 UNITS TABS Take 1 tablet (2,000 Units total) by mouth daily. 90 tablet 3  . ciclopirox (LOPROX) 0.77 % SUSP APPLY 1 PUMP TWICE A DAY. 60 mL 3  . famotidine (PEPCID) 40 MG tablet TAKE 1 TABLET ONCE DAILY. 90 tablet 1  . fexofenadine (ALLEGRA) 180 MG tablet Take 180 mg by mouth daily as needed for allergies.     . furosemide (LASIX) 40 MG tablet Take 1 tablet (40 mg total) by mouth 2 (two) times daily. 180 tablet 3  . ipratropium (ATROVENT) 0.03 % nasal spray USE 2 SPRAYS IN EACH NOSTRIL 3 TIMES A DAY AS NEEDED. 30 mL 0  . Iron-FA-B Cmp-C-Biot-Probiotic (FUSION  PLUS) CAPS Take 1 capsule by mouth daily. 90 capsule 1  . lamoTRIgine (LAMICTAL) 25 MG tablet TAKE 1 TABLET ONCE DAILY. 90 tablet 0  . metoprolol tartrate (LOPRESSOR) 25 MG tablet TAKE 1 TABLET BY MOUTH TWICE DAILY. 180 tablet 0  . modafinil (PROVIGIL) 100 MG tablet Take 1 tablet (100 mg total) by mouth daily. 30 tablet 5  . potassium chloride (KLOR-CON 10) 10 MEQ tablet Take 1 tablet (10 mEq total) by mouth daily. 90 tablet 3  . PRESCRIPTION MEDICATION CPAP    . VIIBRYD 40 MG TABS TAKE 1 TABLET ONCE DAILY. 90 tablet 1  . warfarin (COUMADIN) 2.5 MG tablet TAKE AS DIRECTED PER COUMADIN CLINIC. 40 tablet 0   No facility-administered medications prior to visit.     ROS Review of Systems  Constitutional: Negative.  Negative for diaphoresis, fatigue and unexpected weight change.  HENT: Negative.   Eyes: Negative for visual disturbance.  Respiratory: Negative for cough, chest tightness and wheezing.   Cardiovascular: Negative.  Negative for chest pain, palpitations and leg swelling.  Gastrointestinal: Negative for abdominal pain, constipation, diarrhea, nausea and vomiting.  Endocrine: Negative.   Genitourinary: Negative.  Negative for difficulty urinating.  Musculoskeletal: Negative.  Negative for arthralgias and myalgias.  Skin: Negative.  Negative for color change.  Neurological: Negative.  Negative for dizziness and light-headedness.  Hematological: Negative for adenopathy. Does not bruise/bleed  easily.  Psychiatric/Behavioral: Negative.  Negative for dysphoric mood and suicidal ideas. The patient is not nervous/anxious.     Objective:  BP 130/70 (BP Location: Left Arm, Patient Position: Sitting, Cuff Size: Normal)   Pulse 94   Temp 98.3 F (36.8 C) (Oral)   Ht 5' 4.5" (1.638 m)   Wt 140 lb 8 oz (63.7 kg)   SpO2 94%   BMI 23.74 kg/m   BP Readings from Last 3 Encounters:  02/15/19 130/70  11/23/18 112/60  10/06/18 127/60    Wt Readings from Last 3 Encounters:  02/15/19 140  lb 8 oz (63.7 kg)  11/23/18 154 lb (69.9 kg)  09/23/18 183 lb 12 oz (83.3 kg)    Physical Exam Vitals signs reviewed.  Constitutional:      Appearance: She is not ill-appearing or diaphoretic.  HENT:     Nose: Nose normal.     Mouth/Throat:     Mouth: Mucous membranes are moist.     Pharynx: No oropharyngeal exudate.  Eyes:     General: No scleral icterus.    Conjunctiva/sclera: Conjunctivae normal.  Neck:     Musculoskeletal: Normal range of motion. No neck rigidity.  Cardiovascular:     Rate and Rhythm: Normal rate and regular rhythm.     Heart sounds: Murmur present. Systolic murmur present with a grade of 1/6. No diastolic murmur. No gallop.   Pulmonary:     Effort: Pulmonary effort is normal.     Breath sounds: No stridor. No wheezing, rhonchi or rales.  Abdominal:     General: Abdomen is flat. Bowel sounds are normal. There is no distension.     Palpations: There is no hepatomegaly, splenomegaly or mass.     Tenderness: There is no abdominal tenderness.  Genitourinary:    Comments: Breast, GU, rectal exams were deferred at her request. Musculoskeletal: Normal range of motion.     Right lower leg: No edema.     Left lower leg: No edema.  Lymphadenopathy:     Cervical: No cervical adenopathy.  Skin:    General: Skin is warm and dry.     Coloration: Skin is not jaundiced or pale.  Neurological:     General: No focal deficit present.     Mental Status: She is alert and oriented to person, place, and time. Mental status is at baseline.  Psychiatric:        Mood and Affect: Mood normal.     Lab Results  Component Value Date   WBC 6.2 02/15/2019   HGB 11.7 (L) 02/15/2019   HCT 35.9 (L) 02/15/2019   PLT 190.0 02/15/2019   GLUCOSE 75 11/23/2018   CHOL 148 02/15/2019   TRIG 80.0 02/15/2019   HDL 58.10 02/15/2019   LDLCALC 74 02/15/2019   ALT 11 02/19/2018   AST 24 02/19/2018   NA 137 11/23/2018   K 5.1 11/23/2018   CL 103 11/23/2018   CREATININE 4.09 (H)  11/23/2018   BUN 73 (H) 11/23/2018   CO2 25 11/23/2018   TSH 2.56 02/15/2019   INR 2.0 02/08/2019   HGBA1C 5.8 02/15/2019   MICROALBUR 63.9 (H) 04/20/2018    No results found.  Assessment & Plan:   Phuong was seen today for annual exam, hyperlipidemia, diabetes, hypertension and atrial fibrillation.  Diagnoses and all orders for this visit:  Osteoporosis with pathological fracture of right ankle and foot, sequela -     denosumab (PROLIA) injection 60 mg  Paroxysmal atrial fibrillation ([place])-  She has good rate and rhythm control.  Will continue anticoagulation with warfarin. -     TSH; Future  Type II diabetes mellitus with manifestations ([place])- Her blood sugars are very well controlled. -     Hemoglobin A1c; Future  Routine general medical examination at a health care facility- Exam completed, labs reviewed, vaccines reviewed, cancer screening guidelines reviewed, patient education material was given.  Iron deficiency anemia due to chronic blood loss- Her H&H is stable.  Her iron level is normal.  She most likely has the anemia of chronic disease and renal insufficiency. -     CBC with Differential/Platelet; Future -     IBC panel; Future -     Ferritin; Future  Hyperlipidemia with target LDL less than 100- She has an elevated ASCVD risk score so I have asked her to start taking a statin for CV risk reduction. -     Lipid panel; Future -     TSH; Future   I am having Vallarie L. Gettis "Racchel" start on rosuvastatin. I am also having her maintain her fexofenadine, B-complex with vitamin C, Cholecalciferol, PRESCRIPTION MEDICATION, arformoterol, budesonide, Betamethasone Valerate, modafinil, ipratropium, buPROPion, furosemide, potassium chloride, ciclopirox, Viibryd, budesonide-formoterol, Fusion Plus, lamoTRIgine, famotidine, allopurinol, warfarin, and metoprolol tartrate. We administered denosumab.  Meds ordered this encounter  Medications  . denosumab (PROLIA)  injection 60 mg  . rosuvastatin (CRESTOR) 5 MG tablet    Sig: Take 1 tablet (5 mg total) by mouth daily.    Dispense:  90 tablet    Refill:  1     Follow-up: Return in about 6 months (around 08/18/2019).  Scarlette Calico, MD

## 2019-02-15 NOTE — Patient Instructions (Signed)
Preventive Care 76 Years and Older, Female Preventive care refers to lifestyle choices and visits with your health care provider that can promote health and wellness. This includes:  A yearly physical exam. This is also called an annual well check.  Regular dental and eye exams.  Immunizations.  Screening for certain conditions.  Healthy lifestyle choices, such as diet and exercise. What can I expect for my preventive care visit? Physical exam Your health care provider will check:  Height and weight. These may be used to calculate body mass index (BMI), which is a measurement that tells if you are at a healthy weight.  Heart rate and blood pressure.  Your skin for abnormal spots. Counseling Your health care provider may ask you questions about:  Alcohol, tobacco, and drug use.  Emotional well-being.  Home and relationship well-being.  Sexual activity.  Eating habits.  History of falls.  Memory and ability to understand (cognition).  Work and work Statistician.  Pregnancy and menstrual history. What immunizations do I need?  Influenza (flu) vaccine  This is recommended every year. Tetanus, diphtheria, and pertussis (Tdap) vaccine  You may need a Td booster every 10 years. Varicella (chickenpox) vaccine  You may need this vaccine if you have not already been vaccinated. Zoster (shingles) vaccine  You may need this after age 33. Pneumococcal conjugate (PCV13) vaccine  One dose is recommended after age 33. Pneumococcal polysaccharide (PPSV23) vaccine  One dose is recommended after age 72. Measles, mumps, and rubella (MMR) vaccine  You may need at least one dose of MMR if you were born in 1957 or later. You may also need a second dose. Meningococcal conjugate (MenACWY) vaccine  You may need this if you have certain conditions. Hepatitis A vaccine  You may need this if you have certain conditions or if you travel or work in places where you may be exposed  to hepatitis A. Hepatitis B vaccine  You may need this if you have certain conditions or if you travel or work in places where you may be exposed to hepatitis B. Haemophilus influenzae type b (Hib) vaccine  You may need this if you have certain conditions. You may receive vaccines as individual doses or as more than one vaccine together in one shot (combination vaccines). Talk with your health care provider about the risks and benefits of combination vaccines. What tests do I need? Blood tests  Lipid and cholesterol levels. These may be checked every 5 years, or more frequently depending on your overall health.  Hepatitis C test.  Hepatitis B test. Screening  Lung cancer screening. You may have this screening every year starting at age 39 if you have a 30-pack-year history of smoking and currently smoke or have quit within the past 15 years.  Colorectal cancer screening. All adults should have this screening starting at age 36 and continuing until age 15. Your health care provider may recommend screening at age 23 if you are at increased risk. You will have tests every 1-10 years, depending on your results and the type of screening test.  Diabetes screening. This is done by checking your blood sugar (glucose) after you have not eaten for a while (fasting). You may have this done every 1-3 years.  Mammogram. This may be done every 1-2 years. Talk with your health care provider about how often you should have regular mammograms.  BRCA-related cancer screening. This may be done if you have a family history of breast, ovarian, tubal, or peritoneal cancers.  Other tests  Sexually transmitted disease (STD) testing.  Bone density scan. This is done to screen for osteoporosis. You may have this done starting at age 55. Follow these instructions at home: Eating and drinking  Eat a diet that includes fresh fruits and vegetables, whole grains, lean protein, and low-fat dairy products. Limit  your intake of foods with high amounts of sugar, saturated fats, and salt.  Take vitamin and mineral supplements as recommended by your health care provider.  Do not drink alcohol if your health care provider tells you not to drink.  If you drink alcohol: ? Limit how much you have to 0-1 drink a day. ? Be aware of how much alcohol is in your drink. In the [place] one drink equals one 12 oz bottle of beer (355 mL), one 5 oz glass of wine (148 mL), or one 1 oz glass of hard liquor (44 mL). Lifestyle  Take daily care of your teeth and gums.  Stay active. Exercise for at least 30 minutes on 5 or more days each week.  Do not use any products that contain nicotine or tobacco, such as cigarettes, e-cigarettes, and chewing tobacco. If you need help quitting, ask your health care provider.  If you are sexually active, practice safe sex. Use a condom or other form of protection in order to prevent STIs (sexually transmitted infections).  Talk with your health care provider about taking a low-dose aspirin or statin. What's next?  Go to your health care provider once a year for a well check visit.  Ask your health care provider how often you should have your eyes and teeth checked.  Stay up to date on all vaccines. This information is not intended to replace advice given to you by your health care provider. Make sure you discuss any questions you have with your health care provider. Document Released: 08/17/2015 Document Revised: 07/15/2018 Document Reviewed: 07/15/2018 Elsevier Patient Education  2020 [employer].

## 2019-02-16 MED ORDER — ROSUVASTATIN CALCIUM 5 MG PO TABS: 5.0000 mg | ORAL_TABLET | Freq: Every day | ORAL | 1 refills | Status: DC

## 2019-02-21 ENCOUNTER — Other Ambulatory Visit: Payer: Self-pay | Admitting: Internal Medicine

## 2019-02-21 DIAGNOSIS — F3341 Major depressive disorder, recurrent, in partial remission: Secondary | ICD-10-CM

## 2019-02-21 DIAGNOSIS — F418 Other specified anxiety disorders: Secondary | ICD-10-CM

## 2019-02-22 ENCOUNTER — Other Ambulatory Visit: Payer: Self-pay

## 2019-03-08 ENCOUNTER — Telehealth: Payer: Self-pay

## 2019-03-08 ENCOUNTER — Ambulatory Visit (INDEPENDENT_AMBULATORY_CARE_PROVIDER_SITE_OTHER): Payer: Medicare Other | Admitting: General Practice

## 2019-03-08 ENCOUNTER — Other Ambulatory Visit: Payer: Self-pay

## 2019-03-08 DIAGNOSIS — Z7901 Long term (current) use of anticoagulants: Secondary | ICD-10-CM | POA: Diagnosis not present

## 2019-03-08 DIAGNOSIS — Z1239 Encounter for other screening for malignant neoplasm of breast: Secondary | ICD-10-CM

## 2019-03-08 LAB — POCT INR: INR: 2 (ref 2.0–3.0)

## 2019-03-08 NOTE — Patient Instructions (Addendum)
Pre visit review using our clinic review tool, if applicable. No additional management support is needed unless otherwise documented below in the visit note.  Continue to take 1 (2.5 mg) tablet daily.  Re-check in 6 weeks.

## 2019-03-08 NOTE — Telephone Encounter (Signed)
Pt came in today to see Jenny Reichmann and have INR check.  Pt is requesting order to have mammogram done at the [facility]

## 2019-03-25 ENCOUNTER — Other Ambulatory Visit: Payer: Self-pay | Admitting: Internal Medicine

## 2019-03-28 ENCOUNTER — Other Ambulatory Visit (HOSPITAL_COMMUNITY): Payer: Medicare Other

## 2019-03-30 ENCOUNTER — Encounter: Payer: Medicare Other | Admitting: *Deleted

## 2019-04-04 ENCOUNTER — Ambulatory Visit (INDEPENDENT_AMBULATORY_CARE_PROVIDER_SITE_OTHER): Payer: Medicare Other

## 2019-04-04 DIAGNOSIS — Z23 Encounter for immunization: Secondary | ICD-10-CM

## 2019-04-05 ENCOUNTER — Telehealth: Payer: Self-pay

## 2019-04-11 ENCOUNTER — Encounter: Payer: Self-pay | Admitting: Gastroenterology

## 2019-04-12 ENCOUNTER — Ambulatory Visit (INDEPENDENT_AMBULATORY_CARE_PROVIDER_SITE_OTHER): Payer: Medicare Other | Admitting: *Deleted

## 2019-04-12 DIAGNOSIS — I442 Atrioventricular block, complete: Secondary | ICD-10-CM

## 2019-04-12 LAB — CUP PACEART REMOTE DEVICE CHECK
Battery Impedance: 684 Ohm
Battery Remaining Longevity: 73 mo
Battery Voltage: 2.77 V
Brady Statistic AP VP Percent: 41 %
Brady Statistic AP VS Percent: 0 %
Brady Statistic AS VP Percent: 59 %
Brady Statistic AS VS Percent: 0 %
Date Time Interrogation Session: 20200908134612
Implantable Lead Implant Date: 20131210
Implantable Lead Implant Date: 20131210
Implantable Lead Location: 753858
Implantable Lead Location: 753859
Implantable Lead Model: 4296
Implantable Lead Model: 5076
Implantable Pulse Generator Implant Date: 20131210
Lead Channel Impedance Value: 453 Ohm
Lead Channel Impedance Value: 907 Ohm
Lead Channel Pacing Threshold Amplitude: 0.875 V
Lead Channel Pacing Threshold Amplitude: 0.875 V
Lead Channel Pacing Threshold Pulse Width: 0.4 ms
Lead Channel Pacing Threshold Pulse Width: 0.4 ms
Lead Channel Setting Pacing Amplitude: 2 V
Lead Channel Setting Pacing Amplitude: 2.5 V
Lead Channel Setting Pacing Pulse Width: 0.4 ms
Lead Channel Setting Sensing Sensitivity: 8 mV

## 2019-04-13 ENCOUNTER — Encounter: Payer: Self-pay | Admitting: Internal Medicine

## 2019-04-13 ENCOUNTER — Telehealth (INDEPENDENT_AMBULATORY_CARE_PROVIDER_SITE_OTHER): Payer: Medicare Other | Admitting: Internal Medicine

## 2019-04-13 VITALS — BP 102/66 | HR 86 | Ht 64.5 in | Wt 140.0 lb

## 2019-04-13 DIAGNOSIS — I421 Obstructive hypertrophic cardiomyopathy: Secondary | ICD-10-CM | POA: Diagnosis not present

## 2019-04-13 DIAGNOSIS — I4819 Other persistent atrial fibrillation: Secondary | ICD-10-CM

## 2019-04-13 DIAGNOSIS — I442 Atrioventricular block, complete: Secondary | ICD-10-CM

## 2019-04-13 NOTE — Progress Notes (Signed)
Electrophysiology TeleHealth Note  Due to national recommendations of social distancing due to [place] 19, an audio telehealth visit is felt to be most appropriate for this patient at this time.  Verbal consent was obtained by me for the telehealth visit today.  The patient does not have capability for a virtual visit.  A phone visit is therefore required today.   Date:  04/13/2019   ID:  Holly Simmons, DOB January 29, 1941, MRN [id number]  Location: patient's home  Provider location:  [facility]  Evaluation Performed: Follow-up visit  PCP:  Janith Lima, MD   Electrophysiologist:  Dr Rayann Heman  Chief Complaint:  palpitations  History of Present Illness:    Holly Simmons is a 78 y.o. female who presents via telehealth conferencing today.  Since last being seen in our clinic, the patient reports doing very well.  Today, she denies symptoms of palpitations, chest pain, shortness of breath,  dizziness, presyncope, or syncope. She has had some edema which has resolved with lasix. The patient is otherwise without complaint today.  The patient denies symptoms of fevers, chills, cough, or new SOB worrisome for COVID 19.  Past Medical History:  Diagnosis Date  . Allergic rhinitis   . Asthma    NOS w/ acute exacerbation  . Blood transfusion without reported diagnosis   . CKD (chronic kidney disease) stage 3, GFR 30-59 ml/min ([place]) 09/12/2018  . Colon polyps    TUBULAR ADENOMAS AND HYPERPLASTIC  . Complete heart block (HCC)    requiring PPM (MDT) post surgical myomectomy at [facility],  leads are epicardial with abdominal implant, high ventricular threshold at implant  . COPD (chronic obstructive pulmonary disease) ([place])   . Depressive disorder   . Diastolic dysfunction   . DM (diabetes mellitus) ([place])   . Gallstones   . GERD (gastroesophageal reflux disease)   . Gout 08/20/2012  . Heart murmur   . Hyperpotassemia   . Hypersomnia   . Hypertension   . Hypertrophic cardiomyopathy  ([place])    s/p surgical myomectomy at [facility] 35/57 complicated by septal VSD post procedure requiring reoperation with patch closure and tricuspid valve replacement  . Kidney stones   . Myocardial infarction ([place]) 2011  . Obstructive sleep apnea    persistent daytime sleepiness despite cpap  . 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 Psoriasis   . Pyuria   . Renal insufficiency   . 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 Typical atrial flutter ([place]) 9/15    Past Surgical History:  Procedure Laterality Date  . ABDOMINAL HYSTERECTOMY    . APPENDECTOMY    . BREAST CYST EXCISION    . CARDIOVERSION N/A 08/01/2014   Procedure: CARDIOVERSION;  Surgeon: Thayer Headings, MD;  Location: [place];  Service: Cardiovascular;  Laterality: N/A;  . CESAREAN SECTION    . CHOLECYSTECTOMY    . COLONOSCOPY    . EYE SURGERY  [id number]   left eye lens implant  . EYE SURGERY  [id number]   right eye lens implant  . INSERT / REPLACE / REMOVE PACEMAKER  07/13/2012  . PACEMAKER INSERTION  07/02/11   epicardial wires with abdominal implant at [facility] 12/12,  high ventricular lead threshold at implant per Dr Westley Gambles  . PERMANENT PACEMAKER INSERTION N/A 07/13/2012   Procedure: PERMANENT PACEMAKER INSERTION;  Surgeon: Thompson Grayer, MD;  Location: [facility] CATH LAB;  Service: Cardiovascular;  Laterality: N/A;  . POLYPECTOMY    . septal myomectomy for hypertrophic  CM  06/23/11   by Dr Evelina Dun at [facility], complicated by septal VSD requiring patch repair and tricuspid valve replacement  . TONSILLECTOMY    . TRICUSPID VALVE REPLACEMENT  11/12   Medtronic 50mm Mosaic tissue valve  . VSD REPAIR  06/2011    Current Outpatient Medications  Medication Sig Dispense Refill  . allopurinol (ZYLOPRIM) 100 MG tablet TAKE 1 TABLET BY MOUTH DAILY. 90 tablet 1  . B Complex-C (B-COMPLEX WITH VITAMIN C) tablet Take 1 tablet by mouth daily.    . Betamethasone Valerate 0.12 % foam APPLY TO SCALP QD (Patient taking differently: Apply 1  application topically once a week. Apply to scalp.) 100 g 2  . budesonide (PULMICORT) 0.25 MG/2ML nebulizer solution Take 2 mLs (0.25 mg total) by nebulization 2 (two) times daily. 60 mL 0  . buPROPion (WELLBUTRIN XL) 300 MG 24 hr tablet TAKE 1 TABLET ONCE DAILY. 90 tablet 1  . Cholecalciferol 2000 UNITS TABS Take 1 tablet (2,000 Units total) by mouth daily. 90 tablet 3  . ciclopirox (LOPROX) 0.77 % SUSP APPLY 1 PUMP TWICE A DAY. 60 mL 3  . famotidine (PEPCID) 40 MG tablet TAKE 1 TABLET ONCE DAILY. 90 tablet 1  . fexofenadine (ALLEGRA) 180 MG tablet Take 180 mg by mouth daily as needed for allergies.     . furosemide (LASIX) 40 MG tablet Take 1 tablet (40 mg total) by mouth 2 (two) times daily. 180 tablet 3  . ipratropium (ATROVENT) 0.03 % nasal spray USE 2 SPRAYS IN EACH NOSTRIL 3 TIMES A DAY AS NEEDED. 30 mL 0  . Iron-FA-B Cmp-C-Biot-Probiotic (FUSION PLUS) CAPS Take 1 capsule by mouth daily. 90 capsule 1  . lamoTRIgine (LAMICTAL) 25 MG tablet TAKE 1 TABLET ONCE DAILY. 90 tablet 1  . metoprolol tartrate (LOPRESSOR) 25 MG tablet TAKE 1 TABLET BY MOUTH TWICE DAILY. 180 tablet 0  . modafinil (PROVIGIL) 100 MG tablet Take 1 tablet (100 mg total) by mouth daily. 30 tablet 5  . potassium chloride (KLOR-CON 10) 10 MEQ tablet Take 1 tablet (10 mEq total) by mouth daily. 90 tablet 3  . PRESCRIPTION MEDICATION CPAP    . rosuvastatin (CRESTOR) 5 MG tablet Take 1 tablet (5 mg total) by mouth daily. 90 tablet 1  . SYMBICORT 80-4.5 MCG/ACT inhaler USE 2 PUFFS TWICE A DAY. 30.6 g 1  . VIIBRYD 40 MG TABS TAKE 1 TABLET ONCE DAILY. 90 tablet 1  . warfarin (COUMADIN) 2.5 MG tablet TAKE AS DIRECTED PER COUMADIN CLINIC. 40 tablet 2   No current facility-administered medications for this visit.     Allergies:   Sulfa antibiotics; Sulfonamide derivatives; Tetanus toxoid; Tetanus toxoids; Other; Nsaids; and Tetanus toxoid, adsorbed   Social History:  The patient  reports that she has never smoked. She has never  used smokeless tobacco. She reports that she does not drink alcohol or use drugs.   Family History:  The patient's family history includes Bladder Cancer in her father; Breast cancer in her mother and paternal aunt; Cancer in her father and mother; Dementia in her maternal aunt, maternal uncle, and mother; Heart attack in her father; Heart disease in her maternal grandfather and paternal grandfather; Hypertension in her daughter and mother; Ovarian cancer in her maternal aunt; Pancreatic cancer in her cousin; Prostate cancer in her father; Varicose Veins in her father.   ROS:  Please see the history of present illness.   All other systems are personally reviewed and negative.    Exam:  Vital Signs:  BP 102/66   Pulse 86   Ht 5' 4.5" (1.638 m)   Wt 140 lb (63.5 kg)   SpO2 96%   BMI 23.66 kg/m   Well sounding, alert and conversant   Labs/Other Tests and Data Reviewed:    Recent Labs: 11/23/2018: BUN 73; Creatinine, Ser 4.09; Potassium 5.1; Sodium 137 02/15/2019: Hemoglobin 11.7; Platelets 190.0; TSH 2.56   Wt Readings from Last 3 Encounters:  04/13/19 140 lb (63.5 kg)  02/15/19 140 lb 8 oz (63.7 kg)  11/23/18 154 lb (69.9 kg)     Last device remote is reviewed from [place] PDF which reveals normal device function, afib noted   ASSESSMENT & PLAN:    1.  Symptomatic complete heart block Remotes are uptodate Normal device function Abdominal device is at VVI backup  2. Persistent atrial fibrillation/ atypical atrial flutter Asymptomatic On coumadin  3. Hypertrophic CM/ valvular heart disease On coumadin Follows with Dr Johnsie Cancel  4. HTN Stable No change required today  5. Chronic renal failure, stage IV Considering dialysis   Follow-up:  Remotes Return to see me in a year  Patient Risk:  after full review of this patients clinical status, I feel that they are at moderate risk at this time.  Today, I have spent 15 minutes with the patient with telehealth technology  discussing arrhythmia management .    Army Fossa, MD  04/13/2019 2:41 PM     [address] [place] [address] [phone] (office) [phone] (fax)

## 2019-04-19 ENCOUNTER — Ambulatory Visit (INDEPENDENT_AMBULATORY_CARE_PROVIDER_SITE_OTHER): Payer: Medicare Other | Admitting: General Practice

## 2019-04-19 ENCOUNTER — Other Ambulatory Visit: Payer: Self-pay

## 2019-04-19 DIAGNOSIS — Z7901 Long term (current) use of anticoagulants: Secondary | ICD-10-CM

## 2019-04-19 DIAGNOSIS — I4892 Unspecified atrial flutter: Secondary | ICD-10-CM | POA: Diagnosis not present

## 2019-04-19 LAB — POCT INR: INR: 1.9 — AB (ref 2.0–3.0)

## 2019-04-19 NOTE — Patient Instructions (Addendum)
Pre visit review using our clinic review tool, if applicable. No additional management support is needed unless otherwise documented below in the visit note.  Take 2 tablets today (9/15) and then continue to take 1 (2.5 mg) tablet daily.  Re-check in 4 weeks.

## 2019-04-19 NOTE — Progress Notes (Signed)
I have reviewed and agree.

## 2019-04-25 ENCOUNTER — Other Ambulatory Visit: Payer: Self-pay

## 2019-04-25 ENCOUNTER — Ambulatory Visit
Admission: RE | Admit: 2019-04-25 | Discharge: 2019-04-25 | Disposition: A | Discharge status: Home / Self Care | Payer: Medicare Other | Source: Ambulatory Visit | Attending: Internal Medicine | Admitting: Internal Medicine

## 2019-04-25 ENCOUNTER — Other Ambulatory Visit: Payer: Self-pay | Admitting: Internal Medicine

## 2019-04-25 DIAGNOSIS — Z1239 Encounter for other screening for malignant neoplasm of breast: Secondary | ICD-10-CM

## 2019-04-25 LAB — HM MAMMOGRAPHY

## 2019-04-28 ENCOUNTER — Encounter: Payer: Self-pay | Admitting: Cardiology

## 2019-04-28 NOTE — Progress Notes (Signed)
Remote pacemaker transmission.   

## 2019-05-16 NOTE — Progress Notes (Signed)
Patient ID: Holly Simmons, female   DOB: 1940/12/26, 78 y.o.   MRN: [id number]     78 y.o. female with a complex history of HOCM.  Underwent surgery at [facility] 06/23/11  She was seen in followup by Dr. Rayann Heman 11/15/11 . 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 She underwent cardioversion for atrial flutter and was placed on Coumadin and amiodarone. Epicardial leads are known to have a high threshold.   Subsequently had new pacer placed 1/15 . Could not cross TVR for stable lead but got good threshholds in CS.   PAF with previous [facility] but reversion but pacer interrogation and Dr Ky Barban took strategy of rate control and anticoagulation   Echo 03/19/17 reviewed and no change VSD looks like it has a discontinuity in it but no color flow TV no change in gradients Mild MR and mild AS  Echo 03/23/18 :  Reviewed and TV not well seen but by CW starting to thicken and get a bit stenotic With mean gradient 10 mmHg needs updating   She has LE edema from varicosities and uses lasix Has developed A/CRF over last 10 months Sees nephrology ? Starting dialysis soon Last Cr in Epic 4.09   Seeing [facility] in December Dr Carolin Sicks   ROS: Denies fever, malais, weight loss, blurry vision, decreased visual acuity, cough, sputum, SOB, hemoptysis, pleuritic pain, palpitaitons, heartburn, abdominal pain, melena, lower extremity edema, claudication, or rash.  All other systems reviewed and negative  General: BP 98/60   Pulse 84   Ht 5\' 4"  (1.626 m)   Wt 135 lb (61.2 kg)   SpO2 96%   BMI 23.17 kg/m  Affect appropriate Healthy:  appears stated age 58: normal Neck supple with no adenopathy JVP normal no bruits no thyromegaly Lungs clear with no wheezing and good diaphragmatic motion Heart:  S1/S2 SEM   murmur, no rub, gallop or click PMI normal Abdomen: benighn, BS positve, no tenderness, no AAA no bruit.  No HSM or HJR Distal pulses intact with no bruits Plus one bilateral  Edema with bad varicose veins bilaterally Neuro non-focal Skin warm and dry No muscular weakness    Current Outpatient Medications  Medication Sig Dispense Refill  . allopurinol (ZYLOPRIM) 100 MG tablet TAKE 1 TABLET BY MOUTH DAILY. 90 tablet 1  . B Complex-C (B-COMPLEX WITH VITAMIN C) tablet Take 1 tablet by mouth daily.    . Betamethasone Valerate 0.12 % foam APPLY TO SCALP QD (Patient taking differently: Apply 1 application topically once a week. Apply to scalp.) 100 g 2  . budesonide (PULMICORT) 0.25 MG/2ML nebulizer solution Take 2 mLs (0.25 mg total) by nebulization 2 (two) times daily. 60 mL 0  . buPROPion (WELLBUTRIN XL) 300 MG 24 hr tablet TAKE 1 TABLET ONCE DAILY. 90 tablet 1  . Cholecalciferol 2000 UNITS TABS Take 1 tablet (2,000 Units total) by mouth daily. 90 tablet 3  . ciclopirox (LOPROX) 0.77 % SUSP APPLY 1 PUMP TWICE A DAY. 60 mL 3  . famotidine (PEPCID) 40 MG tablet TAKE 1 TABLET ONCE DAILY. 90 tablet 1  . fexofenadine (ALLEGRA) 180 MG tablet Take 180 mg by mouth daily as needed for allergies.     . furosemide (LASIX) 40 MG tablet Take 1 tablet (40 mg total) by mouth 2 (two) times daily.  180 tablet 3  . ipratropium (ATROVENT) 0.03 % nasal spray USE 2 SPRAYS IN EACH NOSTRIL 3 TIMES A DAY AS NEEDED. 30 mL 0  . Iron-FA-B Cmp-C-Biot-Probiotic (FUSION PLUS) CAPS Take 1 capsule by mouth daily. 90 capsule 1  . lamoTRIgine (LAMICTAL) 25 MG tablet TAKE 1 TABLET ONCE DAILY. 90 tablet 1  . metoprolol tartrate (LOPRESSOR) 25 MG tablet TAKE 1 TABLET BY MOUTH TWICE DAILY. 180 tablet 1  . modafinil (PROVIGIL) 100 MG tablet Take 1 tablet (100 mg total) by mouth daily. 30 tablet 5  . potassium chloride (KLOR-CON 10) 10 MEQ tablet Take 1 tablet (10 mEq total) by mouth daily. 90 tablet 3  . PRESCRIPTION MEDICATION  CPAP    . rosuvastatin (CRESTOR) 5 MG tablet Take 1 tablet (5 mg total) by mouth daily. 90 tablet 1  . SYMBICORT 80-4.5 MCG/ACT inhaler USE 2 PUFFS TWICE A DAY. 30.6 g 1  . VIIBRYD 40 MG TABS TAKE 1 TABLET ONCE DAILY. 90 tablet 1  . warfarin (COUMADIN) 2.5 MG tablet TAKE AS DIRECTED PER COUMADIN CLINIC. 40 tablet 2   No current facility-administered medications for this visit.     Allergies  Petrolatum-zinc oxide; Sulfa antibiotics; Sulfonamide derivatives; Tetanus toxoid; Tetanus toxoids; Other; Nsaids; and Tetanus toxoid, adsorbed  Electrocardiogram:  F.utter with pacing RBBB   Assessment and Plan  HOCM: post myectomy with complication LVOT gradient gone now VSD sealed post MI/myectomy complication Pacer  Thresholds ok f/u [facility] ? Changed to VVIR mode  TV replacement  Mean gradient gone from 7-10 mmHg follow TTE ordered  Anticoagulation  For PAF  No bleeding issues f/u coumadin clinic Afib:  Seen by Dr Rayann Heman  09/26/15 pacer indicated been in afib since October 2016 decided To rate control and anticoagulate since patient is unaware of arrhythmia.  Given valvular afib on coumadin and not NOAC A/CRF:  Need to get notes from [facility] her Cr was only 1.39 10 months ago Renal US reviewed from 09/17/18 no obstruction medical cortical disease f/u renal   Jenkins Rouge

## 2019-05-17 ENCOUNTER — Ambulatory Visit (INDEPENDENT_AMBULATORY_CARE_PROVIDER_SITE_OTHER): Payer: Medicare Other | Admitting: General Practice

## 2019-05-17 ENCOUNTER — Other Ambulatory Visit: Payer: Self-pay

## 2019-05-17 DIAGNOSIS — Z7901 Long term (current) use of anticoagulants: Secondary | ICD-10-CM

## 2019-05-17 DIAGNOSIS — I483 Typical atrial flutter: Secondary | ICD-10-CM

## 2019-05-17 LAB — POCT INR: INR: 1.9 — AB (ref 2.0–3.0)

## 2019-05-17 NOTE — Progress Notes (Signed)
I have reviewed and agree.

## 2019-05-17 NOTE — Patient Instructions (Signed)
Pre visit review using our clinic review tool, if applicable. No additional management support is needed unless otherwise documented below in the visit note.  Take 2 tablets today (10/13) and then change dosage and take  1 (2.5 mg) tablet daily except take 2 tablets on Tuesdays.  Re-check in 4 weeks.

## 2019-05-19 ENCOUNTER — Ambulatory Visit (HOSPITAL_COMMUNITY): Payer: Medicare Other | Attending: Cardiovascular Disease

## 2019-05-19 ENCOUNTER — Encounter: Payer: Self-pay | Admitting: Cardiovascular Disease

## 2019-05-19 ENCOUNTER — Other Ambulatory Visit: Payer: Self-pay

## 2019-05-19 ENCOUNTER — Ambulatory Visit: Payer: Medicare Other | Admitting: Cardiovascular Disease

## 2019-05-19 VITALS — BP 98/60 | HR 84 | Ht 64.0 in | Wt 135.0 lb

## 2019-05-19 DIAGNOSIS — I421 Obstructive hypertrophic cardiomyopathy: Secondary | ICD-10-CM

## 2019-05-19 DIAGNOSIS — I4892 Unspecified atrial flutter: Secondary | ICD-10-CM | POA: Diagnosis present

## 2019-05-19 NOTE — Patient Instructions (Addendum)

## 2019-05-23 ENCOUNTER — Other Ambulatory Visit: Payer: Self-pay | Admitting: Internal Medicine

## 2019-05-23 DIAGNOSIS — B354 Tinea corporis: Secondary | ICD-10-CM

## 2019-05-23 DIAGNOSIS — Z7901 Long term (current) use of anticoagulants: Secondary | ICD-10-CM

## 2019-05-24 MED ORDER — WARFARIN SODIUM 2.5 MG PO TABS: ORAL_TABLET | ORAL | 3 refills | Status: DC

## 2019-06-14 ENCOUNTER — Ambulatory Visit (INDEPENDENT_AMBULATORY_CARE_PROVIDER_SITE_OTHER): Payer: Medicare Other | Admitting: General Practice

## 2019-06-14 ENCOUNTER — Other Ambulatory Visit: Payer: Self-pay

## 2019-06-14 DIAGNOSIS — I483 Typical atrial flutter: Secondary | ICD-10-CM

## 2019-06-14 DIAGNOSIS — Z7901 Long term (current) use of anticoagulants: Secondary | ICD-10-CM

## 2019-06-14 LAB — POCT INR: INR: 1.8 — AB (ref 2.0–3.0)

## 2019-06-14 NOTE — Progress Notes (Signed)
I have reviewed and agree.

## 2019-06-14 NOTE — Patient Instructions (Addendum)
Pre visit review using our clinic review tool, if applicable. No additional management support is needed unless otherwise documented below in the visit note.  Change dosage and take 1 tablet daily except 2 tablets on Tuesdays and Fridays.  Re-check in 4 weeks.

## 2019-06-23 ENCOUNTER — Other Ambulatory Visit: Payer: Self-pay | Admitting: Internal Medicine

## 2019-06-23 DIAGNOSIS — Z7901 Long term (current) use of anticoagulants: Secondary | ICD-10-CM

## 2019-06-23 DIAGNOSIS — M1000 Idiopathic gout, unspecified site: Secondary | ICD-10-CM

## 2019-07-12 ENCOUNTER — Ambulatory Visit (INDEPENDENT_AMBULATORY_CARE_PROVIDER_SITE_OTHER): Payer: Medicare Other | Admitting: *Deleted

## 2019-07-12 ENCOUNTER — Ambulatory Visit (INDEPENDENT_AMBULATORY_CARE_PROVIDER_SITE_OTHER): Payer: Medicare Other | Admitting: General Practice

## 2019-07-12 ENCOUNTER — Other Ambulatory Visit: Payer: Self-pay

## 2019-07-12 DIAGNOSIS — Z7901 Long term (current) use of anticoagulants: Secondary | ICD-10-CM

## 2019-07-12 DIAGNOSIS — I442 Atrioventricular block, complete: Secondary | ICD-10-CM

## 2019-07-12 LAB — CUP PACEART REMOTE DEVICE CHECK
Date Time Interrogation Session: 20201208094100
Implantable Lead Implant Date: 20131210
Implantable Lead Implant Date: 20131210
Implantable Lead Location: 753858
Implantable Lead Location: 753859
Implantable Lead Model: 4296
Implantable Lead Model: 5076
Implantable Pulse Generator Implant Date: 20131210

## 2019-07-12 LAB — POCT INR: INR: 2.6 (ref 2.0–3.0)

## 2019-07-12 NOTE — Patient Instructions (Addendum)
Pre visit review using our clinic review tool, if applicable. No additional management support is needed unless otherwise documented below in the visit note.  Continue to take 1 tablet daily except 2 tablets on Tuesdays and Fridays.  Re-check in 4 weeks.

## 2019-08-12 ENCOUNTER — Ambulatory Visit (INDEPENDENT_AMBULATORY_CARE_PROVIDER_SITE_OTHER): Payer: Medicare PPO | Admitting: General Practice

## 2019-08-12 ENCOUNTER — Other Ambulatory Visit: Payer: Self-pay

## 2019-08-12 DIAGNOSIS — Z7901 Long term (current) use of anticoagulants: Secondary | ICD-10-CM | POA: Diagnosis not present

## 2019-08-12 LAB — POCT INR: INR: 2.4 (ref 2.0–3.0)

## 2019-08-12 NOTE — Progress Notes (Signed)
Medical screening examination/treatment/procedure(s) were performed by non-physician practitioner and as supervising physician I was immediately available for consultation/collaboration. I agree with above. Leslie Jester, MD   

## 2019-08-12 NOTE — Patient Instructions (Addendum)
.  lbpcmh  Continue to take 1 tablet daily except 2 tablets on Tuesdays and Fridays.  Re-check in 4 weeks.

## 2019-08-12 NOTE — Progress Notes (Signed)
PPM remote 

## 2019-08-16 ENCOUNTER — Encounter: Payer: Self-pay | Admitting: Internal Medicine

## 2019-08-16 ENCOUNTER — Ambulatory Visit: Payer: Medicare Other | Admitting: Internal Medicine

## 2019-08-16 ENCOUNTER — Other Ambulatory Visit: Payer: Self-pay

## 2019-08-16 ENCOUNTER — Ambulatory Visit (INDEPENDENT_AMBULATORY_CARE_PROVIDER_SITE_OTHER): Payer: Medicare PPO | Admitting: Internal Medicine

## 2019-08-16 VITALS — BP 138/74 | HR 85 | Temp 98.2°F | Resp 16 | Ht 64.0 in | Wt 136.0 lb

## 2019-08-16 DIAGNOSIS — R64 Cachexia: Secondary | ICD-10-CM

## 2019-08-16 DIAGNOSIS — N185 Chronic kidney disease, stage 5: Secondary | ICD-10-CM | POA: Diagnosis not present

## 2019-08-16 MED ORDER — DRONABINOL 2.5 MG PO CAPS: 2.5000 mg | ORAL_CAPSULE | Freq: Two times a day (BID) | ORAL | 1 refills | Status: DC

## 2019-08-16 NOTE — Progress Notes (Signed)
Subjective:  Patient ID: Holly Simmons, female    DOB: [birth year]  Age: 79 y.o. MRN: [id number]  CC: Chronic Kidney Disease  This visit occurred during the SARS-CoV-2 public health emergency.  Safety protocols were in place, including screening questions prior to the visit, additional usage of staff PPE, and extensive cleaning of exam room while observing appropriate contact time as indicated for disinfecting solutions.    HPI Holly Simmons presents for f/up - She has seen her nephrologist since I last saw her and he has recommended that she start dialysis.  She is not sure that she wants to do that.  She complains of poor appetite, weight loss, and tells me that her dress size has changed from 14-10.  She only eats about 1 meal a day.  Outpatient Medications Prior to Visit  Medication Sig Dispense Refill  . allopurinol (ZYLOPRIM) 100 MG tablet TAKE 1 TABLET BY MOUTH DAILY. 90 tablet 1  . B Complex-C (B-COMPLEX WITH VITAMIN C) tablet Take 1 tablet by mouth daily.    . Betamethasone Valerate 0.12 % foam APPLY TO SCALP QD (Patient taking differently: Apply 1 application topically once a week. Apply to scalp.) 100 g 2  . budesonide (PULMICORT) 0.25 MG/2ML nebulizer solution Take 2 mLs (0.25 mg total) by nebulization 2 (two) times daily. 60 mL 0  . buPROPion (WELLBUTRIN XL) 300 MG 24 hr tablet TAKE 1 TABLET ONCE DAILY. 90 tablet 1  . Cholecalciferol 2000 UNITS TABS Take 1 tablet (2,000 Units total) by mouth daily. 90 tablet 3  . ciclopirox (LOPROX) 0.77 % SUSP APPLY 1 PUMP TWICE A DAY. 60 mL 0  . famotidine (PEPCID) 40 MG tablet TAKE 1 TABLET ONCE DAILY. 90 tablet 1  . fexofenadine (ALLEGRA) 180 MG tablet Take 180 mg by mouth daily as needed for allergies.     . furosemide (LASIX) 40 MG tablet Take 1 tablet (40 mg total) by mouth 2 (two) times daily. (Patient taking differently: Take 40 mg by mouth daily. ) 180 tablet 3  . ipratropium (ATROVENT) 0.03 % nasal spray USE 2 SPRAYS IN  EACH NOSTRIL 3 TIMES A DAY AS NEEDED. 30 mL 0  . Iron-FA-B Cmp-C-Biot-Probiotic (FUSION PLUS) CAPS Take 1 capsule by mouth daily. 90 capsule 1  . ketoconazole (NIZORAL) 2 % cream APPLY TO AFFECTED AREA TWICE A DAY. 60 g 0  . lamoTRIgine (LAMICTAL) 25 MG tablet TAKE 1 TABLET ONCE DAILY. 90 tablet 1  . metoprolol tartrate (LOPRESSOR) 25 MG tablet TAKE 1 TABLET BY MOUTH TWICE DAILY. 180 tablet 1  . modafinil (PROVIGIL) 100 MG tablet Take 1 tablet (100 mg total) by mouth daily. 30 tablet 5  . potassium chloride (KLOR-CON 10) 10 MEQ tablet Take 1 tablet (10 mEq total) by mouth daily. 90 tablet 3  . PRESCRIPTION MEDICATION CPAP    . rosuvastatin (CRESTOR) 5 MG tablet Take 1 tablet (5 mg total) by mouth daily. 90 tablet 1  . SYMBICORT 80-4.5 MCG/ACT inhaler USE 2 PUFFS TWICE A DAY. 30.6 g 1  . VIIBRYD 40 MG TABS TAKE 1 TABLET ONCE DAILY. 90 tablet 1  . warfarin (COUMADIN) 2.5 MG tablet TAKE 1 TAB DAILY EXCEPT 2 ON TUESDAYS, OR AS DIRECTED BY COUMADIN CLINIC 40 tablet 1  . famotidine (PEPCID) 20 MG tablet TAKE (2) TABLETS BY MOUTH DAILY. 180 tablet 1   No facility-administered medications prior to visit.    ROS Review of Systems  Constitutional: Positive for fatigue and unexpected weight change. Negative  for chills, diaphoresis and fever.  HENT: Negative.   Eyes: Negative for visual disturbance.  Respiratory: Negative for cough, chest tightness, shortness of breath and wheezing.   Cardiovascular: Negative for chest pain and leg swelling.  Gastrointestinal: Negative for abdominal pain, constipation, diarrhea, nausea and vomiting.  Endocrine: Negative.   Genitourinary: Negative.  Negative for difficulty urinating.  Musculoskeletal: Negative.   Skin: Negative.   Neurological: Negative.  Negative for dizziness and weakness.  Hematological: Negative.   Psychiatric/Behavioral: Negative.     Objective:  BP 138/74 (BP Location: Left Arm, Patient Position: Sitting, Cuff Size: Normal)   Pulse 85    Temp 98.2 F (36.8 C) (Oral)   Resp 16   Ht 5\' 4"  (1.626 m)   Wt 136 lb (61.7 kg)   SpO2 96%   BMI 23.34 kg/m   BP Readings from Last 3 Encounters:  08/16/19 138/74  05/19/19 98/60  04/13/19 102/66    Wt Readings from Last 3 Encounters:  08/16/19 136 lb (61.7 kg)  05/19/19 135 lb (61.2 kg)  04/13/19 140 lb (63.5 kg)    Physical Exam Vitals reviewed.  Constitutional:      Appearance: Normal appearance.  HENT:     Nose: Nose normal.     Mouth/Throat:     Mouth: Mucous membranes are moist.  Eyes:     General: No scleral icterus.    Conjunctiva/sclera: Conjunctivae normal.  Cardiovascular:     Rate and Rhythm: Normal rate.     Heart sounds: Murmur present. Systolic murmur present with a grade of 1/6. No diastolic murmur. No gallop.   Pulmonary:     Effort: Pulmonary effort is normal.     Breath sounds: No stridor. Examination of the right-lower field reveals rales. Examination of the left-lower field reveals rales. Rales present. No wheezing or rhonchi.  Abdominal:     General: Abdomen is flat. Bowel sounds are normal. There is no distension.     Palpations: Abdomen is soft. There is no hepatomegaly.     Tenderness: There is no abdominal tenderness.  Musculoskeletal:     Cervical back: Neck supple.     Right lower leg: No edema.     Left lower leg: No edema.  Lymphadenopathy:     Cervical: No cervical adenopathy.  Skin:    General: Skin is dry.  Neurological:     General: No focal deficit present.     Mental Status: She is alert.     Lab Results  Component Value Date   WBC 6.2 02/15/2019   HGB 11.7 (L) 02/15/2019   HCT 35.9 (L) 02/15/2019   PLT 190.0 02/15/2019   GLUCOSE 75 11/23/2018   CHOL 148 02/15/2019   TRIG 80.0 02/15/2019   HDL 58.10 02/15/2019   LDLCALC 74 02/15/2019   ALT 11 02/19/2018   AST 24 02/19/2018   NA 137 11/23/2018   K 5.1 11/23/2018   CL 103 11/23/2018   CREATININE 4.09 (H) 11/23/2018   BUN 73 (H) 11/23/2018   CO2 25 11/23/2018    TSH 2.56 02/15/2019   INR 2.4 08/12/2019   HGBA1C 5.8 02/15/2019   MICROALBUR 63.9 (H) 04/20/2018    MM 3D SCREEN BREAST BILATERAL  Result Date: 04/25/2019 CLINICAL DATA:  Screening. EXAM: DIGITAL SCREENING BILATERAL MAMMOGRAM WITH TOMO AND CAD COMPARISON:  Previous exam(s). ACR Breast Density Category b: There are scattered areas of fibroglandular density. FINDINGS: There are no findings suspicious for malignancy. Images were processed with CAD. IMPRESSION: No mammographic evidence  of malignancy. A result letter of this screening mammogram will be mailed directly to the patient. RECOMMENDATION: Screening mammogram in one year. (Code:[id number]-[id number]) BI-RADS CATEGORY  1: Negative. Electronically Signed   By: Audie Pinto M.D.   On: 04/25/2019 16:51    Assessment & Plan:   Holly Simmons was seen today for chronic kidney disease.  Diagnoses and all orders for this visit:  Holly Simmons Holly Simmons)- I have asked her to consider taking dronabinol to improve her appetite so that she will not develop wasting as this will complicate matters. -     dronabinol (MARINOL) 2.5 MG capsule; Take 1 capsule (2.5 mg total) by mouth 2 (two) times daily before lunch and supper.  CKD (chronic kidney disease) stage 5, GFR less than 15 ml/min (HCC)- No lab work was done today at her request.  She tells me she will see her nephrologist soon and I encouraged her to consider starting dialysis to prolong her life.  Her blood pressure is well controlled today.  There is no evidence of disease that requires urgent intervention.   I am having Holly Simmons start on dronabinol. I am also having her maintain her fexofenadine, B-complex with vitamin C, Cholecalciferol, PRESCRIPTION MEDICATION, budesonide, Betamethasone Valerate, modafinil, ipratropium, furosemide, potassium chloride, Fusion Plus, famotidine, rosuvastatin, buPROPion, lamoTRIgine, Symbicort, metoprolol tartrate, ciclopirox, ketoconazole, allopurinol, warfarin, and  Viibryd.  Meds ordered this encounter  Medications  . dronabinol (MARINOL) 2.5 MG capsule    Sig: Take 1 capsule (2.5 mg total) by mouth 2 (two) times daily before lunch and supper.    Dispense:  180 capsule    Refill:  1     Follow-up: Return in about 3 months (around 11/14/2019).  Scarlette Calico, MD

## 2019-08-16 NOTE — Patient Instructions (Signed)
Major Depressive Disorder, Adult Major depressive disorder (MDD) is a mental health condition. It may also be called clinical depression or unipolar depression. MDD usually causes feelings of sadness, hopelessness, or helplessness. MDD can also cause physical symptoms. It can interfere with work, school, relationships, and other everyday activities. MDD may be mild, moderate, or severe. It may occur once (single episode major depressive disorder) or it may occur multiple times (recurrent major depressive disorder). What are the causes? The exact cause of this condition is not known. MDD is most likely caused by a combination of things, which may include:  Genetic factors. These are traits that are passed along from parent to child.  Individual factors. Your personality, your behavior, and the way you handle your thoughts and feelings may contribute to MDD. This includes personality traits and behaviors learned from others.  Physical factors, such as: ? Differences in the part of your brain that controls emotion. This part of your brain may be different than it is in people who do not have MDD. ? Long-term (chronic) medical or psychiatric illnesses.  Social factors. Traumatic experiences or major life changes may play a role in the development of MDD. What increases the risk? This condition is more likely to develop in women. The following factors may also make you more likely to develop MDD:  A family history of depression.  Troubled family relationships.  Abnormally low levels of certain brain chemicals.  Traumatic events in childhood, especially abuse or the loss of a parent.  Being under a lot of stress, or long-term stress, especially from upsetting life experiences or losses.  A history of: ? Chronic physical illness. ? Other mental health disorders. ? Substance abuse.  Poor living conditions.  Experiencing social exclusion or discrimination on a regular basis. What are the  signs or symptoms? The main symptoms of MDD typically include:  Constant depressed or irritable mood.  Loss of interest in things and activities. MDD symptoms may also include:  Sleeping or eating too much or too little.  Unexplained weight change.  Fatigue or low energy.  Feelings of worthlessness or guilt.  Difficulty thinking clearly or making decisions.  Thoughts of suicide or of harming others.  Physical agitation or weakness.  Isolation. Severe cases of MDD may also occur with other symptoms, such as:  Delusions or hallucinations, in which you imagine things that are not real (psychotic depression).  Low-level depression that lasts at least a year (chronic depression or persistent depressive disorder).  Extreme sadness and hopelessness (melancholic depression).  Trouble speaking and moving (catatonic depression). How is this diagnosed? This condition may be diagnosed based on:  Your symptoms.  Your medical history, including your mental health history. This may involve tests to evaluate your mental health. You may be asked questions about your lifestyle, including any drug and alcohol use, and how long you have had symptoms of MDD.  A physical exam.  Blood tests to rule out other conditions. You must have a depressed mood and at least four other MDD symptoms most of the day, nearly every day in the same 2-week timeframe before your health care provider can confirm a diagnosis of MDD. How is this treated? This condition is usually treated by mental health professionals, such as psychologists, psychiatrists, and clinical social workers. You may need more than one type of treatment. Treatment may include:  Psychotherapy. This is also called talk therapy or counseling. Types of psychotherapy include: ? Cognitive behavioral therapy (CBT). This type of therapy   teaches you to recognize unhealthy feelings, thoughts, and behaviors, and replace them with positive thoughts  and actions. ? Interpersonal therapy (IPT). This helps you to improve the way you relate to and communicate with others. ? Family therapy. This treatment includes members of your family.  Medicine to treat anxiety and depression, or to help you control certain emotions and behaviors.  Lifestyle changes, such as: ? Limiting alcohol and drug use. ? Exercising regularly. ? Getting plenty of sleep. ? Making healthy eating choices. ? Spending more time outdoors.  Treatments involving stimulation of the brain can be used in situations with extremely severe symptoms, or when medicine or other therapies do not work over time. These treatments include electroconvulsive therapy, transcranial magnetic stimulation, and vagal nerve stimulation. Follow these instructions at home: Activity  Return to your normal activities as told by your health care provider.  Exercise regularly and spend time outdoors as told by your health care provider. General instructions  Take over-the-counter and prescription medicines only as told by your health care provider.  Do not drink alcohol. If you drink alcohol, limit your alcohol intake to no more than 1 drink a day for nonpregnant women and 2 drinks a day for men. One drink equals 12 oz of beer, 5 oz of wine, or 1 oz of hard liquor. Alcohol can affect any antidepressant medicines you are taking. Talk to your health care provider about your alcohol use.  Eat a healthy diet and get plenty of sleep.  Find activities that you enjoy doing, and make time to do them.  Consider joining a support group. Your health care provider may be able to recommend a support group.  Keep all follow-up visits as told by your health care provider. This is important. Where to find more information National Alliance on Mental Illness  www.nami.org [place] National Institute of Mental Health  www.nimh.nih.gov National Suicide Prevention Lifeline  1-800-273-TALK (8255). This is  free, 24-hour help. Contact a health care provider if:  Your symptoms get worse.  You develop new symptoms. Get help right away if:  You self-harm.  You have serious thoughts about hurting yourself or others.  You see, hear, taste, smell, or feel things that are not present (hallucinate). This information is not intended to replace advice given to you by your health care provider. Make sure you discuss any questions you have with your health care provider. Document Revised: 07/03/2017 Document Reviewed: 01/30/2016 Elsevier Patient Education  2020 [employer]vier Inc.  

## 2019-08-17 ENCOUNTER — Other Ambulatory Visit: Payer: Self-pay | Admitting: Internal Medicine

## 2019-08-17 DIAGNOSIS — I110 Hypertensive heart disease with heart failure: Secondary | ICD-10-CM

## 2019-08-17 DIAGNOSIS — I5032 Chronic diastolic (congestive) heart failure: Secondary | ICD-10-CM

## 2019-08-17 DIAGNOSIS — I422 Other hypertrophic cardiomyopathy: Secondary | ICD-10-CM

## 2019-08-17 DIAGNOSIS — E785 Hyperlipidemia, unspecified: Secondary | ICD-10-CM

## 2019-08-17 DIAGNOSIS — Z7901 Long term (current) use of anticoagulants: Secondary | ICD-10-CM

## 2019-08-17 DIAGNOSIS — F418 Other specified anxiety disorders: Secondary | ICD-10-CM

## 2019-08-17 DIAGNOSIS — B354 Tinea corporis: Secondary | ICD-10-CM

## 2019-08-17 MED ORDER — FUROSEMIDE 40 MG PO TABS: 40.0000 mg | ORAL_TABLET | Freq: Every day | ORAL | 1 refills | Status: DC

## 2019-08-18 ENCOUNTER — Other Ambulatory Visit: Payer: Self-pay | Admitting: Internal Medicine

## 2019-08-18 DIAGNOSIS — F3341 Major depressive disorder, recurrent, in partial remission: Secondary | ICD-10-CM

## 2019-09-09 ENCOUNTER — Ambulatory Visit (INDEPENDENT_AMBULATORY_CARE_PROVIDER_SITE_OTHER): Payer: Medicare PPO | Admitting: General Practice

## 2019-09-09 ENCOUNTER — Other Ambulatory Visit: Payer: Self-pay

## 2019-09-09 DIAGNOSIS — Z7901 Long term (current) use of anticoagulants: Secondary | ICD-10-CM

## 2019-09-09 LAB — POCT INR: INR: 4 — AB (ref 2.0–3.0)

## 2019-09-09 NOTE — Patient Instructions (Addendum)
Pre visit review using our clinic review tool, if applicable. No additional management support is needed unless otherwise documented below in the visit note.  Do not take coumadin today or tomorrow (2/5 and 2/6) and then change dosage and take 1 tablet daily except 2 tablets on Tuesdays only.  Re-check in 3 weeks.

## 2019-09-09 NOTE — Progress Notes (Signed)
Medical screening examination/treatment/procedure(s) were performed by non-physician practitioner and as supervising physician I was immediately available for consultation/collaboration. I agree with above. Analysse Quinonez, MD   

## 2019-09-14 ENCOUNTER — Encounter: Payer: Self-pay | Admitting: Internal Medicine

## 2019-09-15 DIAGNOSIS — H903 Sensorineural hearing loss, bilateral: Secondary | ICD-10-CM | POA: Diagnosis not present

## 2019-09-19 ENCOUNTER — Telehealth: Payer: Self-pay

## 2019-09-19 ENCOUNTER — Other Ambulatory Visit: Payer: Self-pay | Admitting: Internal Medicine

## 2019-09-19 DIAGNOSIS — H43813 Vitreous degeneration, bilateral: Secondary | ICD-10-CM | POA: Diagnosis not present

## 2019-09-19 DIAGNOSIS — Z7901 Long term (current) use of anticoagulants: Secondary | ICD-10-CM

## 2019-09-19 DIAGNOSIS — H524 Presbyopia: Secondary | ICD-10-CM | POA: Diagnosis not present

## 2019-09-19 LAB — HM DIABETES EYE EXAM

## 2019-09-19 NOTE — Telephone Encounter (Signed)
LVM for patient to call back and schedule regarding getting the following labs done at her 09/30/2019 appointment.  Urine Microalbumin Colonoscopy Hemoglobin A1c  Patient needs to see Dr Ronnald Ramp before getting these labs added for 09/30/2019 appointment with [facility].

## 2019-09-22 ENCOUNTER — Ambulatory Visit: Payer: Medicare PPO | Admitting: Internal Medicine

## 2019-09-29 ENCOUNTER — Encounter: Payer: Self-pay | Admitting: Internal Medicine

## 2019-09-30 ENCOUNTER — Other Ambulatory Visit: Payer: Self-pay | Admitting: General Practice

## 2019-09-30 ENCOUNTER — Ambulatory Visit: Payer: Medicare PPO

## 2019-09-30 DIAGNOSIS — Z7901 Long term (current) use of anticoagulants: Secondary | ICD-10-CM

## 2019-10-07 ENCOUNTER — Encounter (HOSPITAL_BASED_OUTPATIENT_CLINIC_OR_DEPARTMENT_OTHER): Payer: Self-pay | Admitting: *Deleted

## 2019-10-07 ENCOUNTER — Encounter: Payer: Self-pay | Admitting: Internal Medicine

## 2019-10-07 ENCOUNTER — Telehealth: Payer: Self-pay | Admitting: *Deleted

## 2019-10-07 ENCOUNTER — Ambulatory Visit: Payer: Medicare PPO | Admitting: Internal Medicine

## 2019-10-07 ENCOUNTER — Emergency Department (HOSPITAL_BASED_OUTPATIENT_CLINIC_OR_DEPARTMENT_OTHER)
Admission: EM | Admit: 2019-10-07 | Discharge: 2019-10-07 | Disposition: A | Discharge status: Home / Self Care | Payer: Medicare PPO | Attending: Emergency Medicine | Admitting: Emergency Medicine

## 2019-10-07 ENCOUNTER — Other Ambulatory Visit: Payer: Self-pay

## 2019-10-07 VITALS — BP 130/80 | HR 91 | Temp 98.0°F | Ht 64.0 in | Wt 123.0 lb

## 2019-10-07 DIAGNOSIS — R809 Proteinuria, unspecified: Secondary | ICD-10-CM

## 2019-10-07 DIAGNOSIS — Z950 Presence of cardiac pacemaker: Secondary | ICD-10-CM | POA: Insufficient documentation

## 2019-10-07 DIAGNOSIS — J449 Chronic obstructive pulmonary disease, unspecified: Secondary | ICD-10-CM | POA: Diagnosis not present

## 2019-10-07 DIAGNOSIS — R791 Abnormal coagulation profile: Secondary | ICD-10-CM | POA: Diagnosis not present

## 2019-10-07 DIAGNOSIS — I483 Typical atrial flutter: Secondary | ICD-10-CM

## 2019-10-07 DIAGNOSIS — E118 Type 2 diabetes mellitus with unspecified complications: Secondary | ICD-10-CM

## 2019-10-07 DIAGNOSIS — N185 Chronic kidney disease, stage 5: Secondary | ICD-10-CM | POA: Diagnosis not present

## 2019-10-07 DIAGNOSIS — I130 Hypertensive heart and chronic kidney disease with heart failure and stage 1 through stage 4 chronic kidney disease, or unspecified chronic kidney disease: Secondary | ICD-10-CM | POA: Diagnosis not present

## 2019-10-07 DIAGNOSIS — R1110 Vomiting, unspecified: Secondary | ICD-10-CM | POA: Diagnosis present

## 2019-10-07 DIAGNOSIS — Z7901 Long term (current) use of anticoagulants: Secondary | ICD-10-CM | POA: Insufficient documentation

## 2019-10-07 DIAGNOSIS — D500 Iron deficiency anemia secondary to blood loss (chronic): Secondary | ICD-10-CM | POA: Diagnosis not present

## 2019-10-07 DIAGNOSIS — E1122 Type 2 diabetes mellitus with diabetic chronic kidney disease: Secondary | ICD-10-CM | POA: Insufficient documentation

## 2019-10-07 DIAGNOSIS — Z79899 Other long term (current) drug therapy: Secondary | ICD-10-CM | POA: Insufficient documentation

## 2019-10-07 DIAGNOSIS — I5030 Unspecified diastolic (congestive) heart failure: Secondary | ICD-10-CM | POA: Insufficient documentation

## 2019-10-07 DIAGNOSIS — I252 Old myocardial infarction: Secondary | ICD-10-CM | POA: Diagnosis not present

## 2019-10-07 DIAGNOSIS — N183 Chronic kidney disease, stage 3 unspecified: Secondary | ICD-10-CM | POA: Insufficient documentation

## 2019-10-07 DIAGNOSIS — E1129 Type 2 diabetes mellitus with other diabetic kidney complication: Secondary | ICD-10-CM | POA: Diagnosis not present

## 2019-10-07 DIAGNOSIS — E785 Hyperlipidemia, unspecified: Secondary | ICD-10-CM | POA: Diagnosis not present

## 2019-10-07 DIAGNOSIS — Z7984 Long term (current) use of oral hypoglycemic drugs: Secondary | ICD-10-CM | POA: Diagnosis not present

## 2019-10-07 DIAGNOSIS — I10 Essential (primary) hypertension: Secondary | ICD-10-CM | POA: Diagnosis not present

## 2019-10-07 LAB — COMPREHENSIVE METABOLIC PANEL
ALT: 16 U/L (ref 0–44)
AST: 21 U/L (ref 15–41)
Albumin: 4.1 g/dL (ref 3.5–5.0)
Alkaline Phosphatase: 101 U/L (ref 38–126)
Anion gap: 11 (ref 5–15)
BUN: 32 mg/dL — ABNORMAL HIGH (ref 8–23)
CO2: 28 mmol/L (ref 22–32)
Calcium: 11 mg/dL — ABNORMAL HIGH (ref 8.9–10.3)
Chloride: 96 mmol/L — ABNORMAL LOW (ref 98–111)
Creatinine, Ser: 2.63 mg/dL — ABNORMAL HIGH (ref 0.44–1.00)
GFR calc Af Amer: 20 mL/min — ABNORMAL LOW (ref >60)
GFR calc non Af Amer: 17 mL/min — ABNORMAL LOW (ref >60)
Glucose, Bld: 127 mg/dL — ABNORMAL HIGH (ref 70–99)
Potassium: 3.5 mmol/L (ref 3.5–5.1)
Sodium: 135 mmol/L (ref 135–145)
Total Bilirubin: 1 mg/dL (ref 0.3–1.2)
Total Protein: 7.3 g/dL (ref 6.5–8.1)

## 2019-10-07 LAB — BASIC METABOLIC PANEL
BUN: 30 mg/dL — ABNORMAL HIGH (ref 6–23)
CO2: 33 mEq/L — ABNORMAL HIGH (ref 19–32)
Calcium: 11.9 mg/dL — ABNORMAL HIGH (ref 8.4–10.5)
Chloride: 96 mEq/L (ref 96–112)
Creatinine, Ser: 2.64 mg/dL — ABNORMAL HIGH (ref 0.40–1.20)
GFR: 17.57 mL/min — ABNORMAL LOW (ref >60.00)
Glucose, Bld: 150 mg/dL — ABNORMAL HIGH (ref 70–99)
Potassium: 3.5 mEq/L (ref 3.5–5.1)
Sodium: 136 mEq/L (ref 135–145)

## 2019-10-07 LAB — CBC WITH DIFFERENTIAL/PLATELET
Abs Immature Granulocytes: 0.04 10*3/uL (ref 0.00–0.07)
Basophils Absolute: 0 10*3/uL (ref 0.0–0.1)
Basophils Absolute: 0 10*3/uL (ref 0.0–0.1)
Basophils Relative: 0.4 % (ref 0.0–3.0)
Basophils Relative: 1 %
Eosinophils Absolute: 0.1 10*3/uL (ref 0.0–0.7)
Eosinophils Absolute: 0.2 10*3/uL (ref 0.0–0.5)
Eosinophils Relative: 1.3 % (ref 0.0–5.0)
Eosinophils Relative: 2 %
HCT: 36.4 % (ref 36.0–46.0)
HCT: 38 % (ref 36.0–46.0)
Hemoglobin: 12.2 g/dL (ref 12.0–15.0)
Hemoglobin: 12.3 g/dL (ref 12.0–15.0)
Immature Granulocytes: 1 %
Lymphocytes Relative: 9.9 % — ABNORMAL LOW (ref 12.0–46.0)
Lymphocytes Relative: 13 %
Lymphs Abs: 0.8 10*3/uL (ref 0.7–4.0)
Lymphs Abs: 1 10*3/uL (ref 0.7–4.0)
MCH: 31.7 pg (ref 26.0–34.0)
MCHC: 33.5 g/dL (ref 30.0–36.0)
MCHC: 32.4 g/dL (ref 30.0–36.0)
MCV: 95 fl (ref 78.0–100.0)
MCV: 97.9 fL (ref 80.0–100.0)
Monocytes Absolute: 0.6 10*3/uL (ref 0.1–1.0)
Monocytes Absolute: 0.7 10*3/uL (ref 0.1–1.0)
Monocytes Relative: 7.7 % (ref 3.0–12.0)
Monocytes Relative: 10 %
Neutro Abs: 6.2 10*3/uL (ref 1.4–7.7)
Neutro Abs: 5.5 10*3/uL (ref 1.7–7.7)
Neutrophils Relative %: 80.7 % — ABNORMAL HIGH (ref 43.0–77.0)
Neutrophils Relative %: 73 %
Platelets: 168 10*3/uL (ref 150.0–400.0)
Platelets: 150 10*3/uL (ref 150–400)
RBC: 3.84 Mil/uL — ABNORMAL LOW (ref 3.87–5.11)
RBC: 3.88 MIL/uL (ref 3.87–5.11)
RDW: 15.2 % (ref 11.5–15.5)
RDW: 14.9 % (ref 11.5–15.5)
WBC: 7.7 10*3/uL (ref 4.0–10.5)
WBC: 7.4 10*3/uL (ref 4.0–10.5)
nRBC: 0 % (ref 0.0–0.2)

## 2019-10-07 LAB — PROTIME-INR
INR: 5.5 ratio (ref 0.8–1.0)
INR: 4 — ABNORMAL HIGH (ref 0.8–1.2)
Prothrombin Time: 60.2 s (ref 9.6–13.1)
Prothrombin Time: 39.2 seconds — ABNORMAL HIGH (ref 11.4–15.2)

## 2019-10-07 LAB — TSH: TSH: 1.36 u[IU]/mL (ref 0.35–4.50)

## 2019-10-07 LAB — HEMOGLOBIN A1C: Hgb A1c MFr Bld: 6.3 % (ref 4.6–6.5)

## 2019-10-07 MED ORDER — SODIUM CHLORIDE 0.9 % IV BOLUS
1000.0000 mL | Freq: Once | INTRAVENOUS | Status: AC
  Administered 2019-10-07: 1000 mL via INTRAVENOUS

## 2019-10-07 NOTE — ED Provider Notes (Signed)
[facility] EMERGENCY DEPARTMENT Provider Note   CSN: [id number] Arrival date & time: 10/07/19  1903     History No chief complaint on file.   Holly Simmons is a 79 y.o. female.  Patient is a 79 year old female with extensive past medical history including hypertrophic cardiomyopathy with surgical myomectomy in 2012, COPD, complete heart block with pacemaker, chronic renal insufficiency.  She presents today for evaluation of vomiting.  She saw her primary doctor for this today.  She had laboratory work obtained and was told to come here for IV fluids as he is concerned she is dehydrated.  Patient denies to me she is having bloody stool or vomit.  She denies any fevers, chills, or ill contacts.  The history is provided by the patient.       Past Medical History:  Diagnosis Date  . Allergic rhinitis   . Asthma    NOS w/ acute exacerbation  . Blood transfusion without reported diagnosis   . CKD (chronic kidney disease) stage 3, GFR 30-59 ml/min 09/12/2018  . Colon polyps    TUBULAR ADENOMAS AND HYPERPLASTIC  . Complete heart block (HCC)    requiring PPM (MDT) post surgical myomectomy at [facility],  leads are epicardial with abdominal implant, high ventricular threshold at implant  . COPD (chronic obstructive pulmonary disease) ([place])   . Depressive disorder   . Diastolic dysfunction   . DM (diabetes mellitus) ([place])   . Gallstones   . GERD (gastroesophageal reflux disease)   . Gout 08/20/2012  . Heart murmur   . Hyperpotassemia   . Hypersomnia   . Hypertension   . Hypertrophic cardiomyopathy ([place])    s/p surgical myomectomy at [facility] 75/64 complicated by septal VSD post procedure requiring reoperation with patch closure and tricuspid valve replacement  . Kidney stones   . Myocardial infarction ([place]) 2011  . Obstructive sleep apnea    persistent daytime sleepiness despite cpap  . 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 Psoriasis   . Pyuria   . Renal insufficiency   . Tricuspid  valve replaced    MDT 37mm Mosaic Valve  . Typical atrial flutter ([place]) 9/15    Patient Active Problem List   Diagnosis Date Noted  . Cachexia ([place]) 08/16/2019  . Iron deficiency anemia due to chronic blood loss 09/23/2018  . CKD (chronic kidney disease) stage 5, GFR less than 15 ml/min (HCC) 09/12/2018  . Microalbuminuria due to type 2 diabetes mellitus ([place]) 04/21/2018  . Obesity (BMI 30.0-34.9) 02/05/2016  . Osteoporosis with pathological fracture of ankle and foot 05/30/2015  . Encounter for therapeutic drug monitoring 11/22/2014  . Atrial flutter ([place]) 05/08/2014  . Complete heart block ([place]) 07/29/2013  . Gout 08/20/2012  . 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 Other screening mammogram 02/04/2012  . Anemia, chronic disease 12/04/2011  . Hyperlipidemia with target LDL less than 100 12/04/2011  . 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 Long term (current) use of anticoagulants 07/18/2011  . Atrial fibrillation ([place]) 07/18/2011  . Routine general medical examination at a health care facility 05/15/2011  . Hypertrophic obstructive cardiomyopathy ([place]) 05/22/2010  . Hypersomnia 08/16/2009  . Depression, major, in partial remission ([place]) 11/28/2008  . Chronic diastolic heart failure ([place]) 08/25/2008  . COPD (chronic obstructive pulmonary disease) with acute bronchitis ([place]) 08/10/2008  . GERD 08/10/2008  . Obstructive sleep apnea 08/10/2007  . Allergic rhinitis 08/09/2007    Past Surgical History:  Procedure Laterality Date  .  ABDOMINAL HYSTERECTOMY    . APPENDECTOMY    . BREAST CYST EXCISION    . CARDIOVERSION N/A 08/01/2014   Procedure: CARDIOVERSION;  Surgeon: Thayer Headings, MD;  Location: [facility];  Service: Cardiovascular;  Laterality: N/A;  . CESAREAN SECTION    . CHOLECYSTECTOMY    . COLONOSCOPY    . EYE SURGERY  [id number]   left eye lens implant  . EYE SURGERY  [id number]   right eye  lens implant  . INSERT / REPLACE / REMOVE PACEMAKER  07/13/2012  . PACEMAKER INSERTION  07/02/11   epicardial wires with abdominal implant at [facility] 12/12,  high ventricular lead threshold at implant per Dr Westley Gambles  . PERMANENT PACEMAKER INSERTION N/A 07/13/2012   Procedure: PERMANENT PACEMAKER INSERTION;  Surgeon: Thompson Grayer, MD;  Location: [facility] CATH LAB;  Service: Cardiovascular;  Laterality: N/A;  . POLYPECTOMY    . septal myomectomy for hypertrophic CM  06/23/11   by Dr Evelina Dun at [facility], complicated by septal VSD requiring patch repair and tricuspid valve replacement  . TONSILLECTOMY    . TRICUSPID VALVE REPLACEMENT  11/12   Medtronic 71mm Mosaic tissue valve  . VSD REPAIR  06/2011     OB History   No obstetric history on file.     Family History  Problem Relation Age of Onset  . Hypertension Daughter   . Heart disease Maternal Grandfather   . Heart disease Paternal Grandfather   . Bladder Cancer Father   . Prostate cancer Father   . Cancer Father   . Varicose Veins Father   . Heart attack Father   . Breast cancer Mother   . Dementia Mother   . Hypertension Mother   . Cancer Mother   . Ovarian cancer Maternal Aunt   . Pancreatic cancer Cousin   . Breast cancer Paternal Aunt   . Dementia Maternal Aunt        x 2  . Dementia Maternal Uncle        x 2  . Colon cancer Neg Hx     Social History   Tobacco Use  . Smoking status: Never Smoker  . Smokeless tobacco: Never Used  Substance Use Topics  . Alcohol use: No    Alcohol/week: 0.0 standard drinks  . Drug use: No    Home Medications Prior to Admission medications   Medication Sig Start Date End Date Taking? Authorizing Provider  B Complex-C (B-COMPLEX WITH VITAMIN C) tablet Take 1 tablet by mouth daily.    [provider]  Betamethasone Valerate 0.12 % foam APPLY TO SCALP QD Patient taking differently: Apply 1 application topically once a week. Apply to scalp. 09/30/17   Janith Lima, MD  buPROPion  (WELLBUTRIN XL) 300 MG 24 hr tablet TAKE 1 TABLET ONCE DAILY. 08/18/19   Janith Lima, MD  ciclopirox (LOPROX) 0.77 % SUSP APPLY 1 PUMP TWICE A DAY. 08/17/19   Janith Lima, MD  dronabinol (MARINOL) 2.5 MG capsule Take 1 capsule (2.5 mg total) by mouth 2 (two) times daily before lunch and supper. 08/16/19   Janith Lima, MD  famotidine (PEPCID) 40 MG tablet TAKE 1 TABLET ONCE DAILY. 12/22/18   Janith Lima, MD  fexofenadine (ALLEGRA) 180 MG tablet Take 180 mg by mouth daily as needed for allergies.     [provider]  ipratropium (ATROVENT) 0.03 % nasal spray USE 2 SPRAYS IN EACH NOSTRIL 3 TIMES A DAY AS NEEDED. 04/29/18   [name],  MD  ketoconazole (NIZORAL) 2 % cream APPLY TO AFFECTED AREA TWICE A DAY. 08/17/19   Janith Lima, MD  lamoTRIgine (LAMICTAL) 25 MG tablet TAKE 1 TABLET ONCE DAILY. 08/17/19   Janith Lima, MD  metoprolol tartrate (LOPRESSOR) 25 MG tablet TAKE 1 TABLET BY MOUTH TWICE DAILY. 04/25/19   Janith Lima, MD  modafinil (PROVIGIL) 100 MG tablet Take 1 tablet (100 mg total) by mouth daily. 03/29/18   [name], MD  PRESCRIPTION MEDICATION CPAP    [provider]  rosuvastatin (CRESTOR) 5 MG tablet TAKE 1 TABLET ONCE DAILY. 08/17/19   Janith Lima, MD  SYMBICORT 80-4.5 MCG/ACT inhaler USE 2 PUFFS TWICE A DAY. 09/19/19   Janith Lima, MD  VIIBRYD 40 MG TABS TAKE 1 TABLET ONCE DAILY. 06/23/19   Janith Lima, MD  warfarin (COUMADIN) 2.5 MG tablet TAKE 1 TAB DAILY EXCEPT 2 ON TUESDAYS, OR AS DIRECTED BY COUMADIN CLINIC 09/19/19   Janith Lima, MD  Alum & Mag Hydroxide-Simeth (MAGIC MOUTHWASH) SOLN Take 5 mLs by mouth 3 (three) times daily. 14 days then stop   05/19/19  [provider]    Allergies    Petrolatum-zinc oxide; Sulfa antibiotics; Sulfonamide derivatives; Tetanus toxoid; Tetanus toxoids; Other; Nsaids; and Tetanus toxoid, adsorbed  Review of Systems   Review of Systems  All other systems reviewed and are  negative.   Physical Exam Updated Vital Signs BP 126/75   Pulse 95   Temp 98.2 F (36.8 C) (Oral)   Resp 14   Ht 5\' 3"  (1.6 m)   Wt 57.2 kg   SpO2 99%   BMI 22.32 kg/m   Physical Exam Vitals and nursing note reviewed.  Constitutional:      General: She is not in acute distress.    Appearance: She is well-developed. She is not diaphoretic.  HENT:     Head: Normocephalic and atraumatic.  Cardiovascular:     Rate and Rhythm: Normal rate and regular rhythm.     Heart sounds: No murmur. No friction rub. No gallop.   Pulmonary:     Effort: Pulmonary effort is normal. No respiratory distress.     Breath sounds: Normal breath sounds. No wheezing.  Abdominal:     General: Bowel sounds are normal. There is no distension.     Palpations: Abdomen is soft.     Tenderness: There is no abdominal tenderness.  Musculoskeletal:        General: No swelling or tenderness. Normal range of motion.     Cervical back: Normal range of motion and neck supple.     Right lower leg: No edema.     Left lower leg: No edema.  Skin:    General: Skin is warm and dry.  Neurological:     Mental Status: She is alert and oriented to person, place, and time.     ED Results / Procedures / Treatments   Labs (all labs ordered are listed, but only abnormal results are displayed) Labs Reviewed  COMPREHENSIVE METABOLIC PANEL  CBC WITH DIFFERENTIAL/PLATELET  PROTIME-INR    EKG EKG Interpretation  Date/Time:  Friday October 07 2019 19:22:45 EST Ventricular Rate:  70 PR Interval:    QRS Duration: 243 QT Interval:  526 QTC Calculation: 568 R Axis:   134 Text Interpretation: AV Dual-Paced Rhythm Confirmed by Veryl Speak [phone]) on 10/07/2019 7:37:34 PM   Radiology No results found.  Procedures Procedures (including critical care time)  Medications Ordered in  ED Medications  sodium chloride 0.9 % bolus 1,000 mL (has no administration in time range)    ED Course  I have reviewed the triage  vital signs and the nursing notes.  Pertinent labs & imaging results that were available during my care of the patient were reviewed by me and considered in my medical decision making (see chart for details).    MDM Rules/Calculators/A&P  Patient presents here with complaints of weakness and laboratory abnormalities at the request of her primary doctor.  Patient was there today and had laboratory studies obtained.  She had a calcium level of 11.9 and her INR was 5.5.  Patient appears clinically well.  Laboratory studies repeated show a calcium of 11 and INR of 4.0.  Patient has not vomited and has no specific complaints otherwise.  I do not feel as though any further work-up is indicated at this time.  Patient to be discharged after receiving IV fluids.  I have advised her to hold her Coumadin for the next 2 days and follow-up with her doctor to have both her Coumadin level and calcium rechecked.  Final Clinical Impression(s) / ED Diagnoses Final diagnoses:  None    Rx / DC Orders ED Discharge Orders    None       Veryl Speak, MD 10/07/19 2146

## 2019-10-07 NOTE — ED Notes (Signed)
Son was called on his cell phone and given update on his mother. Lab work is in progress and IV fluids are being delivered. Pt spoke to her son.

## 2019-10-07 NOTE — ED Notes (Signed)
Dr [place] Jock on phone speaking with [name], patient's son, giving patient's son update and plan of care.

## 2019-10-07 NOTE — Patient Instructions (Signed)
Chronic Kidney Disease, Adult Chronic kidney disease (CKD) occurs when the kidneys become damaged slowly over a long period of time. The kidneys are a pair of organs that do many important jobs in the body, including:  Removing waste and extra fluid from the blood to make urine.  Making hormones that maintain the amount of fluid in tissues and blood vessels.  Maintaining the right amount of fluids and chemicals in the body. A small amount of kidney damage may not cause problems, but a large amount of damage may make it hard or impossible for the kidneys to work the way they should. If steps are not taken to slow down kidney damage or to stop it from getting worse, the kidneys may stop working permanently (end-stage renal disease or ESRD). Most of the time, CKD does not go away, but it can often be controlled. People who have CKD are usually able to live normal lives. What are the causes? The most common causes of this condition are diabetes and high blood pressure (hypertension). Other causes include:  Heart and blood vessel (cardiovascular) disease.  Kidney diseases, such as: ? Glomerulonephritis. ? Interstitial nephritis. ? Polycystic kidney disease. ? Renal vascular disease.  Diseases that affect the immune system.  Genetic diseases.  Medicines that damage the kidneys, such as anti-inflammatory medicines.  Being around or being in contact with poisonous (toxic) substances.  A kidney or urinary infection that occurs again and again (recurs).  Vasculitis. This is swelling or inflammation of the blood vessels.  A problem with urine flow that may be caused by: ? Cancer. ? Having kidney stones more than one time. ? An enlarged prostate, in males. What increases the risk? You are more likely to develop this condition if you:  Are older than age 60.  Are female.  Are African-American, Hispanic, Asian, Pacific Islander, or American Indian.  Are a current or former smoker.   Are obese.  Have a family history of kidney disease or failure.  Often take medicines that are damaging to the kidneys. What are the signs or symptoms? Symptoms of this condition include:  Swelling (edema) of the face, legs, ankles, or feet.  Tiredness (lethargy) and having less energy.  Nausea or vomiting.  Confusion or trouble concentrating.  Problems with urination, such as: ? Painful or burning feeling during urination. ? Decreased urine production. ? Frequent urination, especially at night. ? Bloody urine.  Muscle twitches and cramps, especially in the legs.  Shortness of breath.  Weakness.  Loss of appetite.  Metallic taste in the mouth.  Trouble sleeping.  Dry, itchy skin.  A low blood count (anemia).  Pale lining of the eyelids and surface of the eye (conjunctiva). Symptoms develop slowly and may not be obvious until the kidney damage becomes severe. It is possible to have kidney disease for years without having any symptoms. How is this diagnosed? This condition may be diagnosed based on:  Blood tests.  Urine tests.  Imaging tests, such as an ultrasound or CT scan.  A test in which a sample of tissue is removed from the kidneys to be examined under a microscope (kidney biopsy). These test results will help your health care provider determine how serious the CKD is. How is this treated? There is no cure for most cases of this condition, but treatment usually relieves symptoms and prevents or slows the progression of the disease. Treatment may include:  Making diet changes, which may require you to avoid alcohol, salty foods (sodium),   and foods that are high in potassium, calcium, and protein.  Medicines: ? To lower blood pressure. ? To control blood glucose. ? To relieve anemia. ? To relieve swelling. ? To protect your bones. ? To improve the balance of electrolytes in your blood.  Removing toxic waste from the body through types of dialysis, if  the kidneys can no longer do their job (kidney failure).  Managing any other conditions that are causing your CKD or making it worse. Follow these instructions at home: Medicines  Take over-the-counter and prescription medicines only as told by your health care provider. The dose of some medicines that you take may need to be adjusted.  Do not take any new medicines unless approved by your health care provider. Many medicines can worsen your kidney damage.  Do not take any vitamin and mineral supplements unless approved by your health care provider. Many nutritional supplements can worsen your kidney damage. General instructions  Follow your prescribed diet as told by your health care provider.  Do not use any products that contain nicotine or tobacco, such as cigarettes and e-cigarettes. If you need help quitting, ask your health care provider.  Monitor and track your blood pressure at home. Report changes in your blood pressure as told by your health care provider.  If you are being treated for diabetes, monitor and track your blood sugar (blood glucose) levels as told by your health care provider.  Maintain a healthy weight. If you need help with this, ask your health care provider.  Start or continue an exercise plan. Exercise at least 30 minutes a day, 5 days a week.  Keep your immunizations up to date as told by your health care provider.  Keep all follow-up visits as told by your health care provider. This is important. Where to find more information  American Association of Kidney Patients: www.aakp.org  National Kidney Foundation: www.kidney.org  American Kidney Fund: www.akfinc.org  Life Options Rehabilitation Program: www.lifeoptions.org and www.kidneyschool.org Contact a health care provider if:  Your symptoms get worse.  You develop new symptoms. Get help right away if:  You develop symptoms of ESRD, which include: ? Headaches. ? Numbness in the hands or  feet. ? Easy bruising. ? Frequent hiccups. ? Chest pain. ? Shortness of breath. ? Lack of menstruation, in women.  You have a fever.  You have decreased urine production.  You have pain or bleeding when you urinate. Summary  Chronic kidney disease (CKD) occurs when the kidneys become damaged slowly over a long period of time.  The most common causes of this condition are diabetes and high blood pressure (hypertension).  There is no cure for most cases of this condition, but treatment usually relieves symptoms and prevents or slows the progression of the disease. Treatment may include a combination of medicines and lifestyle changes. This information is not intended to replace advice given to you by your health care provider. Make sure you discuss any questions you have with your health care provider. Document Revised: 07/03/2017 Document Reviewed: 08/28/2016 Elsevier Patient Education  2020 [employer].  

## 2019-10-07 NOTE — Telephone Encounter (Signed)
Patient's PT is 60.2, INR is 5.5.

## 2019-10-07 NOTE — Progress Notes (Signed)
Subjective:  Patient ID: Holly Simmons, female    DOB: 1940/11/30  Age: 79 y.o. MRN: [id number]  CC: Anemia, Hypertension, Diabetes, and Atrial Fibrillation  This visit occurred during the SARS-CoV-2 public health emergency.  Safety protocols were in place, including screening questions prior to the visit, additional usage of staff PPE, and extensive cleaning of exam room while observing appropriate contact time as indicated for disinfecting solutions.    HPI HADASAH BRUGGER presents for f/up - When I last saw about 2 months ago we talked about her starting hemodialysis.  She was scheduled at that time to see a nephrologist a week later but she says the visit never took place because the nephrologist went on vacation.  She said she has an appointment with the nephrologist later this month to consider starting dialysis.  She continues to feel poorly with weight loss.  She tells me that Marinol has not helped her gain weight.  She has nausea, dry heaves, loss of appetite, weakness, lightheadedness, and excessive bruising and bleeding on her extremities.  Outpatient Medications Prior to Visit  Medication Sig Dispense Refill  . B Complex-C (B-COMPLEX WITH VITAMIN C) tablet Take 1 tablet by mouth daily.    . Betamethasone Valerate 0.12 % foam APPLY TO SCALP QD (Patient taking differently: Apply 1 application topically once a week. Apply to scalp.) 100 g 2  . buPROPion (WELLBUTRIN XL) 300 MG 24 hr tablet TAKE 1 TABLET ONCE DAILY. 90 tablet 1  . ciclopirox (LOPROX) 0.77 % SUSP APPLY 1 PUMP TWICE A DAY. 60 mL 0  . dronabinol (MARINOL) 2.5 MG capsule Take 1 capsule (2.5 mg total) by mouth 2 (two) times daily before lunch and supper. 180 capsule 1  . famotidine (PEPCID) 40 MG tablet TAKE 1 TABLET ONCE DAILY. 90 tablet 1  . fexofenadine (ALLEGRA) 180 MG tablet Take 180 mg by mouth daily as needed for allergies.     Marland Kitchen ipratropium (ATROVENT) 0.03 % nasal spray USE 2 SPRAYS IN EACH NOSTRIL 3  TIMES A DAY AS NEEDED. 30 mL 0  . ketoconazole (NIZORAL) 2 % cream APPLY TO AFFECTED AREA TWICE A DAY. 60 g 0  . lamoTRIgine (LAMICTAL) 25 MG tablet TAKE 1 TABLET ONCE DAILY. 90 tablet 0  . metoprolol tartrate (LOPRESSOR) 25 MG tablet TAKE 1 TABLET BY MOUTH TWICE DAILY. 180 tablet 1  . modafinil (PROVIGIL) 100 MG tablet Take 1 tablet (100 mg total) by mouth daily. 30 tablet 5  . PRESCRIPTION MEDICATION CPAP    . rosuvastatin (CRESTOR) 5 MG tablet TAKE 1 TABLET ONCE DAILY. 90 tablet 0  . SYMBICORT 80-4.5 MCG/ACT inhaler USE 2 PUFFS TWICE A DAY. 30.6 g 5  . VIIBRYD 40 MG TABS TAKE 1 TABLET ONCE DAILY. 90 tablet 1  . warfarin (COUMADIN) 2.5 MG tablet TAKE 1 TAB DAILY EXCEPT 2 ON TUESDAYS, OR AS DIRECTED BY COUMADIN CLINIC 40 tablet 0  . allopurinol (ZYLOPRIM) 100 MG tablet TAKE 1 TABLET BY MOUTH DAILY. 90 tablet 1  . Cholecalciferol 2000 UNITS TABS Take 1 tablet (2,000 Units total) by mouth daily. 90 tablet 3  . furosemide (LASIX) 40 MG tablet Take 1 tablet (40 mg total) by mouth daily. 90 tablet 1  . potassium chloride (KLOR-CON) 10 MEQ tablet TAKE 1 TABLET ONCE DAILY. 90 tablet 1  . budesonide (PULMICORT) 0.25 MG/2ML nebulizer solution Take 2 mLs (0.25 mg total) by nebulization 2 (two) times daily. 60 mL 0   No facility-administered medications prior to visit.  ROS Review of Systems  Constitutional: Positive for activity change, appetite change, fatigue and unexpected weight change. Negative for chills, diaphoresis and fever.  HENT: Negative.  Negative for trouble swallowing.   Eyes: Negative.   Respiratory: Negative for cough, chest tightness, shortness of breath and wheezing.   Cardiovascular: Negative for chest pain and leg swelling.  Gastrointestinal: Positive for nausea and vomiting. Negative for abdominal pain, anal bleeding, blood in stool, constipation and diarrhea.  Endocrine: Negative.   Genitourinary: Negative.  Negative for difficulty urinating, dysuria and hematuria.   Musculoskeletal: Negative.   Skin: Negative.  Negative for rash.  Neurological: Positive for weakness and light-headedness. Negative for dizziness, seizures and numbness.  Hematological: Negative for adenopathy. Bruises/bleeds easily.  Psychiatric/Behavioral: Negative.     Objective:  BP 130/80 (BP Location: Left Arm, Patient Position: Sitting, Cuff Size: Large)   Pulse 91   Temp 98 F (36.7 C) (Oral)   Ht 5\' 4"  (1.626 m)   Wt 123 lb (55.8 kg)   SpO2 99%   BMI 21.11 kg/m   BP Readings from Last 3 Encounters:  10/07/19 131/65  10/07/19 130/80  08/16/19 138/74    Wt Readings from Last 3 Encounters:  10/07/19 126 lb (57.2 kg)  10/07/19 123 lb (55.8 kg)  08/16/19 136 lb (61.7 kg)    Physical Exam Vitals reviewed.  Constitutional:      Appearance: She is cachectic.  HENT:     Nose: Nose normal.     Mouth/Throat:     Mouth: Mucous membranes are moist.  Eyes:     General: No scleral icterus.    Conjunctiva/sclera: Conjunctivae normal.  Cardiovascular:     Rate and Rhythm: Normal rate.     Heart sounds: Murmur present. Systolic murmur present with a grade of 1/6.  Pulmonary:     Effort: Pulmonary effort is normal.     Breath sounds: No stridor. No wheezing, rhonchi or rales.  Abdominal:     General: Abdomen is flat. Bowel sounds are normal. There is no distension.     Palpations: There is no hepatomegaly, splenomegaly or mass.  Musculoskeletal:     Cervical back: Neck supple.     Right lower leg: No edema.     Left lower leg: No edema.  Lymphadenopathy:     Cervical: No cervical adenopathy.  Skin:    General: Skin is warm.     Coloration: Skin is not pale.     Findings: No rash.  Neurological:     General: No focal deficit present.     Mental Status: She is alert.     Motor: Weakness present.  Psychiatric:        Mood and Affect: Mood normal.        Behavior: Behavior normal.     Lab Results  Component Value Date   WBC 7.4 10/07/2019   HGB 12.3  10/07/2019   HCT 38.0 10/07/2019   PLT 150 10/07/2019   GLUCOSE 127 (H) 10/07/2019   CHOL 148 02/15/2019   TRIG 80.0 02/15/2019   HDL 58.10 02/15/2019   LDLCALC 74 02/15/2019   ALT 16 10/07/2019   AST 21 10/07/2019   NA 135 10/07/2019   K 3.5 10/07/2019   CL 96 (L) 10/07/2019   CREATININE 2.63 (H) 10/07/2019   BUN 32 (H) 10/07/2019   CO2 28 10/07/2019   TSH 1.36 10/07/2019   INR 4.0 (H) 10/07/2019   HGBA1C 6.3 10/07/2019   MICROALBUR 63.9 (H) 04/20/2018  MM 3D SCREEN BREAST BILATERAL  Result Date: 04/25/2019 CLINICAL DATA:  Screening. EXAM: DIGITAL SCREENING BILATERAL MAMMOGRAM WITH TOMO AND CAD COMPARISON:  Previous exam(s). ACR Breast Density Category b: There are scattered areas of fibroglandular density. FINDINGS: There are no findings suspicious for malignancy. Images were processed with CAD. IMPRESSION: No mammographic evidence of malignancy. A result letter of this screening mammogram will be mailed directly to the patient. RECOMMENDATION: Screening mammogram in one year. (Code:SM-B-01Y) BI-RADS CATEGORY  1: Negative. Electronically Signed   By: Audie Pinto M.D.   On: 04/25/2019 16:51    Assessment & Plan:   Ota was seen today for anemia, hypertension, diabetes and atrial fibrillation.  Diagnoses and all orders for this visit:  Microalbuminuria due to type 2 diabetes mellitus (HCC)  CKD (chronic kidney disease) stage 5, GFR less than 15 ml/min ([place])- Her renal function is stable. -     Basic metabolic panel  Iron deficiency anemia due to chronic blood loss- Her H&H are normal now. -     CBC with Differential/Platelet  Long term (current) use of anticoagulants- Her INR is supratherapeutic at 5.5.  I recommended that she hold the Coumadin for now. -     Protime-INR  Typical atrial flutter ([place])- She is maintaining sinus rhythm and her TSH is in the normal range. -     TSH  Type 2 diabetes mellitus with complication, without long-term current use of  insulin ([place])- Her blood sugars are adequately well controlled.  Medical therapy is not indicated. -     Hemoglobin A1c  Hypercalcemia- Her calcium level is elevated at 11.9 and she is symptomatic with this.  At this time I do not know the cause for the hypercalcemia.  I recommended that she go to the ED to be treated with IV saline to reduce the risk of complications and to treat the symptoms.   I have discontinued Kayton L. Barberi's Cholecalciferol, budesonide, allopurinol, furosemide, and potassium chloride. I am also having her maintain her fexofenadine, B-complex with vitamin C, PRESCRIPTION MEDICATION, Betamethasone Valerate, modafinil, ipratropium, famotidine, metoprolol tartrate, Viibryd, dronabinol, ciclopirox, ketoconazole, lamoTRIgine, rosuvastatin, buPROPion, Symbicort, and warfarin.  No orders of the defined types were placed in this encounter.    Follow-up: Return if symptoms worsen or fail to improve.  Scarlette Calico, MD

## 2019-10-07 NOTE — Discharge Instructions (Addendum)
Hold your Coumadin for the next 2 days, then resume as before.  Follow-up with your primary doctor in the next 2 to 3 days for a recheck of your INR and metabolic panel/calcium.  Return to the emergency department in the meantime if your symptoms significantly worsen or change.

## 2019-10-07 NOTE — ED Triage Notes (Signed)
She saw her MD today for vomiting. No abdominal pain or diarrhea. She feels weak. She was told by her MD to come to the ED for possible IV fluids. She is alert and oriented.

## 2019-10-11 ENCOUNTER — Telehealth: Payer: Self-pay

## 2019-10-11 ENCOUNTER — Ambulatory Visit (INDEPENDENT_AMBULATORY_CARE_PROVIDER_SITE_OTHER): Payer: Medicare PPO | Admitting: *Deleted

## 2019-10-11 DIAGNOSIS — I442 Atrioventricular block, complete: Secondary | ICD-10-CM

## 2019-10-11 NOTE — Telephone Encounter (Signed)
The pt called because she was unsuccessful in sending a transmission with her monitor. She was concerned that she would get charge a $50 late fee for not being able to send. I let her know that we do not charge a late fee for the home remote checks. I called [employer] tech support so the pt can get additional help.  [employer] is sending her a new monitor. She should receive it in 7-10 business days. I cancelled her upcoming appointment and told her to call me when she receive the new monitor.

## 2019-10-12 ENCOUNTER — Other Ambulatory Visit: Payer: Self-pay

## 2019-10-12 DIAGNOSIS — N184 Chronic kidney disease, stage 4 (severe): Secondary | ICD-10-CM | POA: Diagnosis not present

## 2019-10-12 LAB — CUP PACEART REMOTE DEVICE CHECK
Battery Impedance: 761 Ohm
Battery Remaining Longevity: 66 mo
Battery Voltage: 2.77 V
Brady Statistic AP VP Percent: 46 %
Brady Statistic AP VS Percent: 0 %
Brady Statistic AS VP Percent: 53 %
Brady Statistic AS VS Percent: 0 %
Date Time Interrogation Session: 20210309130208
Implantable Lead Implant Date: 20131210
Implantable Lead Implant Date: 20131210
Implantable Lead Location: 753858
Implantable Lead Location: 753859
Implantable Lead Model: 4296
Implantable Lead Model: 5076
Implantable Pulse Generator Implant Date: 20131210
Lead Channel Impedance Value: 435 Ohm
Lead Channel Impedance Value: 805 Ohm
Lead Channel Pacing Threshold Amplitude: 0.875 V
Lead Channel Pacing Threshold Amplitude: 1.125 V
Lead Channel Pacing Threshold Pulse Width: 0.4 ms
Lead Channel Pacing Threshold Pulse Width: 0.4 ms
Lead Channel Setting Pacing Amplitude: 2 V
Lead Channel Setting Pacing Amplitude: 2.75 V
Lead Channel Setting Pacing Pulse Width: 0.4 ms
Lead Channel Setting Sensing Sensitivity: 8 mV

## 2019-10-12 NOTE — Progress Notes (Signed)
PPM Remote  

## 2019-10-13 ENCOUNTER — Other Ambulatory Visit: Payer: Self-pay | Admitting: Internal Medicine

## 2019-10-13 ENCOUNTER — Ambulatory Visit (INDEPENDENT_AMBULATORY_CARE_PROVIDER_SITE_OTHER): Payer: Medicare PPO | Admitting: General Practice

## 2019-10-13 DIAGNOSIS — I484 Atypical atrial flutter: Secondary | ICD-10-CM

## 2019-10-13 DIAGNOSIS — Z7901 Long term (current) use of anticoagulants: Secondary | ICD-10-CM | POA: Diagnosis not present

## 2019-10-13 MED ORDER — METOPROLOL TARTRATE 25 MG PO TABS: 25.0000 mg | ORAL_TABLET | Freq: Two times a day (BID) | ORAL | 1 refills | Status: DC

## 2019-10-13 NOTE — Addendum Note (Signed)
Addended by: Aviva Signs M on: 10/13/2019 01:15 PM   Modules accepted: Orders

## 2019-10-13 NOTE — Patient Instructions (Signed)
Pre visit review using our clinic review tool, if applicable. No additional management support is needed unless otherwise documented below in the visit note.  Take 5 mg today (2 tablets) and then take 1 tablet (2.5 mg) daily.  Re-check in 1 week.  Instructions given to [name], patient's son and he did verbalize understanding.

## 2019-10-14 NOTE — Progress Notes (Signed)
I have reviewed and agree.

## 2019-10-18 ENCOUNTER — Emergency Department (HOSPITAL_BASED_OUTPATIENT_CLINIC_OR_DEPARTMENT_OTHER): Payer: Medicare PPO

## 2019-10-18 ENCOUNTER — Other Ambulatory Visit: Payer: Self-pay

## 2019-10-18 ENCOUNTER — Encounter (HOSPITAL_BASED_OUTPATIENT_CLINIC_OR_DEPARTMENT_OTHER): Payer: Self-pay

## 2019-10-18 ENCOUNTER — Emergency Department (HOSPITAL_BASED_OUTPATIENT_CLINIC_OR_DEPARTMENT_OTHER)
Admission: EM | Admit: 2019-10-18 | Discharge: 2019-10-19 | Disposition: A | Discharge status: Home / Self Care | Payer: Medicare PPO | Attending: Emergency Medicine | Admitting: Emergency Medicine

## 2019-10-18 DIAGNOSIS — J449 Chronic obstructive pulmonary disease, unspecified: Secondary | ICD-10-CM | POA: Insufficient documentation

## 2019-10-18 DIAGNOSIS — Z20822 Contact with and (suspected) exposure to covid-19: Secondary | ICD-10-CM | POA: Diagnosis not present

## 2019-10-18 DIAGNOSIS — E1122 Type 2 diabetes mellitus with diabetic chronic kidney disease: Secondary | ICD-10-CM | POA: Insufficient documentation

## 2019-10-18 DIAGNOSIS — M7918 Myalgia, other site: Secondary | ICD-10-CM | POA: Diagnosis not present

## 2019-10-18 DIAGNOSIS — M791 Myalgia, unspecified site: Secondary | ICD-10-CM | POA: Diagnosis not present

## 2019-10-18 DIAGNOSIS — Z952 Presence of prosthetic heart valve: Secondary | ICD-10-CM | POA: Diagnosis not present

## 2019-10-18 DIAGNOSIS — R05 Cough: Secondary | ICD-10-CM | POA: Insufficient documentation

## 2019-10-18 DIAGNOSIS — R531 Weakness: Secondary | ICD-10-CM | POA: Diagnosis not present

## 2019-10-18 DIAGNOSIS — Z7901 Long term (current) use of anticoagulants: Secondary | ICD-10-CM | POA: Diagnosis not present

## 2019-10-18 DIAGNOSIS — N185 Chronic kidney disease, stage 5: Secondary | ICD-10-CM | POA: Insufficient documentation

## 2019-10-18 DIAGNOSIS — Z950 Presence of cardiac pacemaker: Secondary | ICD-10-CM | POA: Diagnosis not present

## 2019-10-18 DIAGNOSIS — I132 Hypertensive heart and chronic kidney disease with heart failure and with stage 5 chronic kidney disease, or end stage renal disease: Secondary | ICD-10-CM | POA: Diagnosis not present

## 2019-10-18 DIAGNOSIS — R110 Nausea: Secondary | ICD-10-CM | POA: Insufficient documentation

## 2019-10-18 DIAGNOSIS — R5383 Other fatigue: Secondary | ICD-10-CM | POA: Insufficient documentation

## 2019-10-18 DIAGNOSIS — Z79899 Other long term (current) drug therapy: Secondary | ICD-10-CM | POA: Insufficient documentation

## 2019-10-18 DIAGNOSIS — M549 Dorsalgia, unspecified: Secondary | ICD-10-CM | POA: Diagnosis not present

## 2019-10-18 DIAGNOSIS — I5032 Chronic diastolic (congestive) heart failure: Secondary | ICD-10-CM | POA: Diagnosis not present

## 2019-10-18 LAB — COMPREHENSIVE METABOLIC PANEL
ALT: 15 U/L (ref 0–44)
AST: 26 U/L (ref 15–41)
Albumin: 3.8 g/dL (ref 3.5–5.0)
Alkaline Phosphatase: 108 U/L (ref 38–126)
Anion gap: 10 (ref 5–15)
BUN: 15 mg/dL (ref 8–23)
CO2: 26 mmol/L (ref 22–32)
Calcium: 10.9 mg/dL — ABNORMAL HIGH (ref 8.9–10.3)
Chloride: 102 mmol/L (ref 98–111)
Creatinine, Ser: 1.35 mg/dL — ABNORMAL HIGH (ref 0.44–1.00)
GFR calc Af Amer: 44 mL/min — ABNORMAL LOW (ref >60)
GFR calc non Af Amer: 38 mL/min — ABNORMAL LOW (ref >60)
Glucose, Bld: 87 mg/dL (ref 70–99)
Potassium: 3.8 mmol/L (ref 3.5–5.1)
Sodium: 138 mmol/L (ref 135–145)
Total Bilirubin: 1.2 mg/dL (ref 0.3–1.2)
Total Protein: 7.1 g/dL (ref 6.5–8.1)

## 2019-10-18 LAB — CBC WITH DIFFERENTIAL/PLATELET
Abs Immature Granulocytes: 0.02 10*3/uL (ref 0.00–0.07)
Basophils Absolute: 0 10*3/uL (ref 0.0–0.1)
Basophils Relative: 0 %
Eosinophils Absolute: 0.2 10*3/uL (ref 0.0–0.5)
Eosinophils Relative: 3 %
HCT: 37.3 % (ref 36.0–46.0)
Hemoglobin: 12 g/dL (ref 12.0–15.0)
Immature Granulocytes: 0 %
Lymphocytes Relative: 16 %
Lymphs Abs: 1 10*3/uL (ref 0.7–4.0)
MCH: 31.6 pg (ref 26.0–34.0)
MCHC: 32.2 g/dL (ref 30.0–36.0)
MCV: 98.2 fL (ref 80.0–100.0)
Monocytes Absolute: 0.8 10*3/uL (ref 0.1–1.0)
Monocytes Relative: 13 %
Neutro Abs: 4.3 10*3/uL (ref 1.7–7.7)
Neutrophils Relative %: 68 %
Platelets: 155 10*3/uL (ref 150–400)
RBC: 3.8 MIL/uL — ABNORMAL LOW (ref 3.87–5.11)
RDW: 15.4 % (ref 11.5–15.5)
WBC: 6.3 10*3/uL (ref 4.0–10.5)
nRBC: 0 % (ref 0.0–0.2)

## 2019-10-18 LAB — URINALYSIS, MICROSCOPIC (REFLEX): WBC, UA: NONE SEEN WBC/hpf (ref 0–5)

## 2019-10-18 LAB — URINALYSIS, ROUTINE W REFLEX MICROSCOPIC
Bilirubin Urine: NEGATIVE
Glucose, UA: NEGATIVE mg/dL
Ketones, ur: NEGATIVE mg/dL
Leukocytes,Ua: NEGATIVE
Nitrite: NEGATIVE
Protein, ur: NEGATIVE mg/dL
Specific Gravity, Urine: 1.02 (ref 1.005–1.030)
pH: 8.5 — ABNORMAL HIGH (ref 5.0–8.0)

## 2019-10-18 LAB — PROTIME-INR
INR: 1.6 — ABNORMAL HIGH (ref 0.8–1.2)
Prothrombin Time: 19.3 seconds — ABNORMAL HIGH (ref 11.4–15.2)

## 2019-10-18 LAB — BRAIN NATRIURETIC PEPTIDE: B Natriuretic Peptide: 411.8 pg/mL — ABNORMAL HIGH (ref 0.0–100.0)

## 2019-10-18 LAB — LACTIC ACID, PLASMA: Lactic Acid, Venous: 1.2 mmol/L (ref 0.5–1.9)

## 2019-10-18 LAB — RESPIRATORY PANEL BY RT PCR (FLU A&B, COVID)
Influenza A by PCR: NEGATIVE
Influenza B by PCR: NEGATIVE
SARS Coronavirus 2 by RT PCR: NEGATIVE

## 2019-10-18 LAB — LIPASE, BLOOD: Lipase: 25 U/L (ref 11–51)

## 2019-10-18 NOTE — ED Provider Notes (Signed)
[facility] EMERGENCY DEPARTMENT Provider Note   CSN: [id number] Arrival date & time: 10/18/19  2056     History Chief Complaint  Patient presents with  . Multiple c/o    Holly Simmons is a 79 y.o. female.  Patient brought in by her son.  Sort of presenting for multiple complaints.  Main complaints seem to be that she is not feeling well talks about some back pain no history of fall sort of body aches fatigue weakness some nausea.  Denies any chest pain.  Little bit of cough but no shortness of breath.  Denies any dysuria.  Patient's had the Covid vaccine had the second 1 4 weeks ago.  Patient has a history significant for complete heart block.  Has a pacemaker.  Known to have chronic kidney disease stage III.  Known to have COPD.  Diastolic dysfunction.  Also diabetes hypertension and hypertrophic cardiomyopathy tricuspid valve replacement.  Past history of atrial flutter.  Patient is on Coumadin.  Patient was seen March 5.  At that time patient's renal function seem to be a bit worse.  And also her Coumadin level was supratherapeutic.  Since that time she has held the Coumadin.  Just restarted that about 4 days ago.  There was a little bit of a mixup and got held for too long.  She has an appointment with her doctors tomorrow.  Her son looks after her carefully.  He noticed a change in her starting last evening.  Morning that she appeared not to be feeling well.  He is concerned about the fact that her kidney function has been bad.  On the visit of March 5.  Urinalysis was not checked.  Patient denies any fevers.  But does have chills.  Vital signs here are very reassuring oxygen saturation on room air is in the upper 90s.  She is afebrile.  Heart rate in the 60s blood pressure 140/80.  Respirations 18.  Also no leg swelling.        Past Medical History:  Diagnosis Date  . Allergic rhinitis   . Asthma    NOS w/ acute exacerbation  . Blood transfusion without  reported diagnosis   . CKD (chronic kidney disease) stage 3, GFR 30-59 ml/min 09/12/2018  . Colon polyps    TUBULAR ADENOMAS AND HYPERPLASTIC  . Complete heart block (HCC)    requiring PPM (MDT) post surgical myomectomy at [facility],  leads are epicardial with abdominal implant, high ventricular threshold at implant  . COPD (chronic obstructive pulmonary disease) ([place])   . Depressive disorder   . Diastolic dysfunction   . DM (diabetes mellitus) ([place])   . Gallstones   . GERD (gastroesophageal reflux disease)   . Gout 08/20/2012  . Heart murmur   . Hyperpotassemia   . Hypersomnia   . Hypertension   . Hypertrophic cardiomyopathy ([place])    s/p surgical myomectomy at [facility] 55/73 complicated by septal VSD post procedure requiring reoperation with patch closure and tricuspid valve replacement  . Kidney stones   . Myocardial infarction ([place]) 2011  . Obstructive sleep apnea    persistent daytime sleepiness despite cpap  . 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 Psoriasis   . Pyuria   . Renal insufficiency   . 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 Typical atrial flutter ([place]) 9/15    Patient Active Problem List   Diagnosis Date Noted  . Hypercalcemia 10/08/2019  . Cachexia ([place]) 08/16/2019  . Iron  deficiency anemia due to chronic blood loss 09/23/2018  . CKD (chronic kidney disease) stage 5, GFR less than 15 ml/min (HCC) 09/12/2018  . Microalbuminuria due to type 2 diabetes mellitus ([place]) 04/21/2018  . Obesity (BMI 30.0-34.9) 02/05/2016  . Osteoporosis with pathological fracture of ankle and foot 05/30/2015  . Encounter for therapeutic drug monitoring 11/22/2014  . Atrial flutter ([place]) 05/08/2014  . Complete heart block ([place]) 07/29/2013  . Gout 08/20/2012  . 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 Other screening mammogram 02/04/2012  . Anemia, chronic disease 12/04/2011  . Hyperlipidemia with target LDL less than 100 12/04/2011  . VSD (ventricular septal defect and aortic arch hypoplasia  09/25/2011  . Type 2 diabetes mellitus with complication, without long-term current use of insulin ([place]) 08/25/2011  . 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 Long term (current) use of anticoagulants 07/18/2011  . Atrial fibrillation ([place]) 07/18/2011  . Routine general medical examination at a health care facility 05/15/2011  . Hypertrophic obstructive cardiomyopathy ([place]) 05/22/2010  . Hypersomnia 08/16/2009  . 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 Chronic diastolic heart failure ([place]) 08/25/2008  . COPD (chronic obstructive pulmonary disease) with acute bronchitis ([place]) 08/10/2008  . GERD 08/10/2008  . Obstructive sleep apnea 08/10/2007  . Allergic rhinitis 08/09/2007    Past Surgical History:  Procedure Laterality Date  . ABDOMINAL HYSTERECTOMY    . APPENDECTOMY    . BREAST CYST EXCISION    . CARDIOVERSION N/A 08/01/2014   Procedure: CARDIOVERSION;  Surgeon: Thayer Headings, MD;  Location: [facility];  Service: Cardiovascular;  Laterality: N/A;  . CESAREAN SECTION    . CHOLECYSTECTOMY    . COLONOSCOPY    . EYE SURGERY  [id number]   left eye lens implant  . EYE SURGERY  [id number]   right eye lens implant  . INSERT / REPLACE / REMOVE PACEMAKER  07/13/2012  . PACEMAKER INSERTION  07/02/11   epicardial wires with abdominal implant at [facility] 12/12,  high ventricular lead threshold at implant per Dr Westley Gambles  . PERMANENT PACEMAKER INSERTION N/A 07/13/2012   Procedure: PERMANENT PACEMAKER INSERTION;  Surgeon: Thompson Grayer, MD;  Location: [facility] CATH LAB;  Service: Cardiovascular;  Laterality: N/A;  . POLYPECTOMY    . septal myomectomy for hypertrophic CM  06/23/11   by Dr Evelina Dun at [facility], complicated by septal VSD requiring patch repair and tricuspid valve replacement  . TONSILLECTOMY    . TRICUSPID VALVE REPLACEMENT  11/12   Medtronic 30mm Mosaic tissue valve  . VSD REPAIR  06/2011     OB History   No obstetric history on file.     Family History    Problem Relation Age of Onset  . Hypertension Daughter   . Heart disease Maternal Grandfather   . Heart disease Paternal Grandfather   . Bladder Cancer Father   . Prostate cancer Father   . Cancer Father   . Varicose Veins Father   . Heart attack Father   . Breast cancer Mother   . Dementia Mother   . Hypertension Mother   . Cancer Mother   . Ovarian cancer Maternal Aunt   . Pancreatic cancer Cousin   . Breast cancer Paternal Aunt   . Dementia Maternal Aunt        x 2  . Dementia Maternal Uncle        x 2  . Colon cancer Neg Hx     Social History   Tobacco Use  . Smoking status: Never Smoker  .  Smokeless tobacco: Never Used  Substance Use Topics  . Alcohol use: No    Alcohol/week: 0.0 standard drinks  . Drug use: No    Home Medications Prior to Admission medications   Medication Sig Start Date End Date Taking? Authorizing Provider  B Complex-C (B-COMPLEX WITH VITAMIN C) tablet Take 1 tablet by mouth daily.    [provider]  Betamethasone Valerate 0.12 % foam APPLY TO SCALP QD Patient taking differently: Apply 1 application topically once a week. Apply to scalp. 09/30/17   Janith Lima, MD  buPROPion (WELLBUTRIN XL) 300 MG 24 hr tablet TAKE 1 TABLET ONCE DAILY. 08/18/19   Janith Lima, MD  ciclopirox (LOPROX) 0.77 % SUSP APPLY 1 PUMP TWICE A DAY. 08/17/19   Janith Lima, MD  dronabinol (MARINOL) 2.5 MG capsule Take 1 capsule (2.5 mg total) by mouth 2 (two) times daily before lunch and supper. 08/16/19   Janith Lima, MD  famotidine (PEPCID) 40 MG tablet TAKE 1 TABLET ONCE DAILY. 12/22/18   Janith Lima, MD  fexofenadine (ALLEGRA) 180 MG tablet Take 180 mg by mouth daily as needed for allergies.     [provider]  ipratropium (ATROVENT) 0.03 % nasal spray USE 2 SPRAYS IN EACH NOSTRIL 3 TIMES A DAY AS NEEDED. 04/29/18   [name], MD  ketoconazole (NIZORAL) 2 % cream APPLY TO AFFECTED AREA TWICE A DAY. 08/17/19   Janith Lima, MD   lamoTRIgine (LAMICTAL) 25 MG tablet TAKE 1 TABLET ONCE DAILY. 08/17/19   Janith Lima, MD  metoprolol tartrate (LOPRESSOR) 25 MG tablet Take 1 tablet (25 mg total) by mouth 2 (two) times daily. 10/13/19   Janith Lima, MD  modafinil (PROVIGIL) 100 MG tablet Take 1 tablet (100 mg total) by mouth daily. 03/29/18   [name], MD  PRESCRIPTION MEDICATION CPAP    [provider]  rosuvastatin (CRESTOR) 5 MG tablet TAKE 1 TABLET ONCE DAILY. 08/17/19   Janith Lima, MD  SYMBICORT 80-4.5 MCG/ACT inhaler USE 2 PUFFS TWICE A DAY. 09/19/19   Janith Lima, MD  VIIBRYD 40 MG TABS TAKE 1 TABLET ONCE DAILY. 06/23/19   Janith Lima, MD  warfarin (COUMADIN) 2.5 MG tablet TAKE 1 TAB DAILY EXCEPT 2 ON TUESDAYS, OR AS DIRECTED BY COUMADIN CLINIC 09/19/19   Janith Lima, MD  Alum & Mag Hydroxide-Simeth (MAGIC MOUTHWASH) SOLN Take 5 mLs by mouth 3 (three) times daily. 14 days then stop   05/19/19  [provider]    Allergies    Petrolatum-zinc oxide; Sulfa antibiotics; Sulfonamide derivatives; Tetanus toxoid; Tetanus toxoids; Other; Nsaids; and Tetanus toxoid, adsorbed  Review of Systems   Review of Systems  Constitutional: Positive for chills and fatigue. Negative for fever.  HENT: Negative for congestion, rhinorrhea and sore throat.   Eyes: Negative for visual disturbance.  Respiratory: Positive for cough. Negative for shortness of breath.   Cardiovascular: Negative for chest pain and leg swelling.  Gastrointestinal: Positive for nausea. Negative for abdominal pain, diarrhea and vomiting.  Genitourinary: Negative for dysuria.  Musculoskeletal: Positive for back pain and myalgias. Negative for neck pain.  Skin: Negative for rash.  Neurological: Positive for weakness. Negative for dizziness, light-headedness and headaches.  Hematological: Bruises/bleeds easily.  Psychiatric/Behavioral: Negative for confusion.    Physical Exam Updated Vital Signs BP (!) 134/58 (BP  Location: Right Arm)   Pulse 62   Temp 98.2 F (36.8 C) (Oral)   Resp (!) 21  Wt 56.4 kg Comment: bed scale per EMT  SpO2 100%   BMI 22.02 kg/m   Physical Exam Vitals and nursing note reviewed.  Constitutional:      General: She is not in acute distress.    Appearance: Normal appearance. She is well-developed.  HENT:     Head: Normocephalic and atraumatic.  Eyes:     Conjunctiva/sclera: Conjunctivae normal.     Pupils: Pupils are equal, round, and reactive to light.  Cardiovascular:     Rate and Rhythm: Normal rate and regular rhythm.     Heart sounds: No murmur.  Pulmonary:     Effort: Pulmonary effort is normal. No respiratory distress.     Breath sounds: Normal breath sounds.  Abdominal:     Palpations: Abdomen is soft.     Tenderness: There is no abdominal tenderness.     Hernia: No hernia is present.  Musculoskeletal:        General: No swelling.     Cervical back: Normal range of motion and neck supple.     Right lower leg: No edema.     Left lower leg: No edema.  Skin:    General: Skin is warm and dry.     Capillary Refill: Capillary refill takes less than 2 seconds.  Neurological:     General: No focal deficit present.     Mental Status: She is alert and oriented to person, place, and time.     ED Results / Procedures / Treatments   Labs (all labs ordered are listed, but only abnormal results are displayed) Labs Reviewed  COMPREHENSIVE METABOLIC PANEL - Abnormal; Notable for the following components:      Result Value   Creatinine, Ser 1.35 (*)    Calcium 10.9 (*)    GFR calc non Af Amer 38 (*)    GFR calc Af Amer 44 (*)    All other components within normal limits  CBC WITH DIFFERENTIAL/PLATELET - Abnormal; Notable for the following components:   RBC 3.80 (*)    All other components within normal limits  PROTIME-INR - Abnormal; Notable for the following components:   Prothrombin Time 19.3 (*)    INR 1.6 (*)    All other components within normal  limits  BRAIN NATRIURETIC PEPTIDE - Abnormal; Notable for the following components:   B Natriuretic Peptide 411.8 (*)    All other components within normal limits  RESPIRATORY PANEL BY RT PCR (FLU A&B, COVID)  CULTURE, BLOOD (ROUTINE X 2)  CULTURE, BLOOD (ROUTINE X 2)  URINE CULTURE  LIPASE, BLOOD  LACTIC ACID, PLASMA  URINALYSIS, ROUTINE W REFLEX MICROSCOPIC    EKG EKG Interpretation  Date/Time:  Tuesday October 18 2019 21:45:03 EDT Ventricular Rate:  63 PR Interval:    QRS Duration: 210 QT Interval:  534 QTC Calculation: 547 R Axis:   132 Text Interpretation: AV Duled Paced Rhythm Confirmed by Fredia Sorrow [phone]) on 10/18/2019 10:00:35 PM   Radiology DG Chest Port 1 View  Result Date: 10/18/2019 CLINICAL DATA:  Weakness. EXAM: PORTABLE CHEST 1 VIEW COMPARISON:  November 17, 2017 FINDINGS: There is a multi lead AICD. Multiple sternal wires are noted. There is no evidence of acute infiltrate, pleural effusion or pneumothorax. The cardiac silhouette is mildly enlarged and unchanged in size. The visualized skeletal structures are unremarkable. IMPRESSION: Mild, stable cardiomegaly without acute or active cardiopulmonary disease. Electronically Signed   By: Virgina Norfolk M.D.   On: 10/18/2019 22:13    Procedures Procedures (  including critical care time)  Medications Ordered in ED Medications - No data to display  ED Course  I have reviewed the triage vital signs and the nursing notes.  Pertinent labs & imaging results that were available during my care of the patient were reviewed by me and considered in my medical decision making (see chart for details).    MDM Rules/Calculators/A&P                       Lab work-up urinalysis still pending.  But otherwise very reassuring.  Chest x-ray without evidence of pulmonary edema.  BNP elevated a little bit of 400 range but that is where she tends to be.  No evidence of fluid on the lungs and no leg swelling.  Renal functions  improved significantly particularly her GFR.  That is very reassuring.  Normal white blood cell count.  No significant anemia.  Electrolytes without any significant abnormalities.  Lactic acid not elevated.  Blood cultures were done and are pending.  Patient also tested for Covid and influenza AMB and that is all negative.  Patient's vital signs not consistent with any sepsis process.  Patient has follow-up with her doctors already scheduled for tomorrow.  Her INR came in subtherapeutic.  This is probably because she has been off of her Coumadin.  But she restarted about 4 days ago.  Her doctors will be able to follow this up.  Urinalysis is pending.  If negative patient can be discharged home if it shows evidence of urinary tract infection patient can still be discharged home but need to be started on an antibiotic.  She has an allergy to sulfur medications does not have an allergy to penicillin so Keflex would be an appropriate antibiotic.    Final Clinical Impression(s) / ED Diagnoses Final diagnoses:  Myalgia    Rx / DC Orders ED Discharge Orders    None       Fredia Sorrow, MD 10/18/19 2327

## 2019-10-18 NOTE — ED Triage Notes (Addendum)
Per son pt was c/o pain "kind of all over"-"says she is "weak-can't move-numbness to fingers" with sx started yesterday-"blood in the toilet after urinating" per pt x 1 yesterday-pt denies pain at this time-son states pt with recent issues r/t to kidneys and has appt with nephrologist tomorrow-NAD-to triage in w/c

## 2019-10-18 NOTE — ED Notes (Signed)
PT given warm blankets

## 2019-10-18 NOTE — Discharge Instructions (Signed)
Work-up here tonight very reassuring.  Renal function is improved significantly.  Her Coumadin level is still low.  But this probably because of the interruption in taking the Coumadin.  It sounds like she is back on her normal dose.  We will have her doctor follow-up on that.  Keep your appointment with your doctors that she has scheduled for tomorrow.  Chest x-ray negative Covid testing negative influenza testing negative.  Urinalysis is still pending.  If it shows signs of urinary tract infection you will be started on an antibiotic.  Lab and chest x-ray results provided.

## 2019-10-19 ENCOUNTER — Other Ambulatory Visit: Payer: Self-pay | Admitting: Nephrology

## 2019-10-19 ENCOUNTER — Other Ambulatory Visit (HOSPITAL_COMMUNITY): Payer: Self-pay | Admitting: Nephrology

## 2019-10-19 ENCOUNTER — Encounter: Payer: Self-pay | Admitting: Internal Medicine

## 2019-10-19 DIAGNOSIS — N2581 Secondary hyperparathyroidism of renal origin: Secondary | ICD-10-CM | POA: Diagnosis not present

## 2019-10-19 DIAGNOSIS — N184 Chronic kidney disease, stage 4 (severe): Secondary | ICD-10-CM | POA: Diagnosis not present

## 2019-10-19 DIAGNOSIS — R319 Hematuria, unspecified: Secondary | ICD-10-CM | POA: Diagnosis not present

## 2019-10-19 DIAGNOSIS — I129 Hypertensive chronic kidney disease with stage 1 through stage 4 chronic kidney disease, or unspecified chronic kidney disease: Secondary | ICD-10-CM | POA: Diagnosis not present

## 2019-10-19 DIAGNOSIS — R809 Proteinuria, unspecified: Secondary | ICD-10-CM | POA: Diagnosis not present

## 2019-10-19 DIAGNOSIS — D631 Anemia in chronic kidney disease: Secondary | ICD-10-CM | POA: Diagnosis not present

## 2019-10-19 NOTE — ED Provider Notes (Signed)
I assumed care in signout to follow-up on labs.  Urinalysis negative.  All other labs are improved Patient has nephrology follow-up this week.  Discussed need to have close PCP follow-up as well.  Patient apparently had just diffuse body aches that are now improved.  She is in no acute distress at this time. She reports having difficulty ambulating, but this is not a new phenomenon.   Ripley Fraise, MD 10/19/19 [phone]

## 2019-10-20 ENCOUNTER — Other Ambulatory Visit: Payer: Self-pay

## 2019-10-20 ENCOUNTER — Other Ambulatory Visit: Payer: Self-pay | Admitting: General Practice

## 2019-10-20 ENCOUNTER — Ambulatory Visit: Payer: Medicare PPO | Admitting: General Practice

## 2019-10-20 DIAGNOSIS — Z7901 Long term (current) use of anticoagulants: Secondary | ICD-10-CM | POA: Diagnosis not present

## 2019-10-20 LAB — URINE CULTURE

## 2019-10-20 LAB — POCT INR: INR: 1.5 — AB (ref 2.0–3.0)

## 2019-10-20 MED ORDER — WARFARIN SODIUM 2.5 MG PO TABS: ORAL_TABLET | ORAL | 3 refills | Status: DC

## 2019-10-20 NOTE — Patient Instructions (Addendum)
Pre visit review using our clinic review tool, if applicable. No additional management support is needed unless otherwise documented below in the visit note.   Take 5 mg today (2 tablets) and tomorrow and then 1 tablet (2.5 mg) daily.  Re-check in 1 week.  Instructions given to Holly Simmons, patient's son and he did verbalize understanding.

## 2019-10-20 NOTE — Progress Notes (Signed)
I have reviewed and agree.

## 2019-10-24 LAB — CULTURE, BLOOD (ROUTINE X 2)
Culture: NO GROWTH
Culture: NO GROWTH
Special Requests: ADEQUATE
Special Requests: ADEQUATE

## 2019-10-27 ENCOUNTER — Ambulatory Visit (INDEPENDENT_AMBULATORY_CARE_PROVIDER_SITE_OTHER): Payer: Medicare PPO | Admitting: General Practice

## 2019-10-27 ENCOUNTER — Other Ambulatory Visit: Payer: Self-pay

## 2019-10-27 DIAGNOSIS — Z7901 Long term (current) use of anticoagulants: Secondary | ICD-10-CM

## 2019-10-27 LAB — POCT INR: INR: 2.1 (ref 2.0–3.0)

## 2019-10-27 NOTE — Patient Instructions (Addendum)
Pre visit review using our clinic review tool, if applicable. No additional management support is needed unless otherwise documented below in the visit note.  Continue to take 1 tablet (2.5 mg) daily.  Re-check in 4 weeks.

## 2019-10-27 NOTE — Progress Notes (Signed)
I have reviewed and agree.

## 2019-11-01 ENCOUNTER — Encounter (HOSPITAL_COMMUNITY)
Admission: RE | Admit: 2019-11-01 | Discharge: 2019-11-01 | Disposition: A | Discharge status: Home / Self Care | Payer: Medicare PPO | Source: Ambulatory Visit | Attending: Nephrology | Admitting: Nephrology

## 2019-11-01 ENCOUNTER — Ambulatory Visit (HOSPITAL_COMMUNITY)
Admission: RE | Admit: 2019-11-01 | Discharge: 2019-11-01 | Disposition: A | Discharge status: Home / Self Care | Payer: Medicare PPO | Source: Ambulatory Visit | Attending: Nephrology | Admitting: Nephrology

## 2019-11-01 ENCOUNTER — Other Ambulatory Visit: Payer: Self-pay

## 2019-11-01 DIAGNOSIS — N2581 Secondary hyperparathyroidism of renal origin: Secondary | ICD-10-CM

## 2019-11-01 DIAGNOSIS — N184 Chronic kidney disease, stage 4 (severe): Secondary | ICD-10-CM | POA: Insufficient documentation

## 2019-11-01 MED ORDER — TECHNETIUM TC 99M SESTAMIBI GENERIC - CARDIOLITE
26.8000 | Freq: Once | INTRAVENOUS | Status: AC | PRN
  Administered 2019-11-01: 26.8 via INTRAVENOUS

## 2019-11-14 ENCOUNTER — Ambulatory Visit: Payer: Medicare PPO | Admitting: Internal Medicine

## 2019-11-16 ENCOUNTER — Other Ambulatory Visit: Payer: Self-pay | Admitting: Internal Medicine

## 2019-11-16 DIAGNOSIS — F418 Other specified anxiety disorders: Secondary | ICD-10-CM

## 2019-11-16 DIAGNOSIS — B354 Tinea corporis: Secondary | ICD-10-CM

## 2019-11-22 NOTE — Progress Notes (Signed)
Patient ID: Holly Simmons, female   DOB: Dec 02, 1940, 79 y.o.   MRN: [id number]     78 y.o. female with a complex history of HOCM.  Underwent surgery at [facility] 06/23/11  She was seen in followup by Dr. Rayann Heman 11/15/11 . 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 She underwent cardioversion for atrial flutter and was placed on Coumadin and amiodarone. Epicardial leads are known to have a high threshold.   Subsequently had new pacer placed 1/15 . Could not cross TVR for stable lead but got good threshholds in CS.   PAF with previous [facility] but reversion per Dr Rayann Heman rate control and anticoagulation strategy   Echo 05/19/19 EF normal mean gradient across TV 5 mmHg. 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 Told her to talk to Dr Ronnald Ramp about getting some PT To help with her strength and balance   ROS: Denies fever, malais, weight loss, blurry vision, decreased visual acuity, cough, sputum, SOB, hemoptysis, pleuritic pain, palpitaitons, heartburn, abdominal pain, melena, lower extremity edema, claudication, or rash.  All other systems reviewed and negative  General: BP 138/69   Pulse 67   Temp (!) 97.5 F (36.4 C)   Wt 125 lb (56.7 kg)   SpO2 96%   BMI 22.14 kg/m  No distress No JVP elevation Skin no lesions No edema     Current Outpatient Medications  Medication Sig Dispense Refill  . B Complex-C (B-COMPLEX WITH VITAMIN C) tablet Take 1 tablet by mouth daily.    . Betamethasone Valerate 0.12 % foam APPLY TO SCALP QD (Patient taking differently: Apply 1 application topically once a week. Apply to scalp.) 100 g 2  .  buPROPion (WELLBUTRIN XL) 300 MG 24 hr tablet TAKE 1 TABLET ONCE DAILY. 90 tablet 1  . ciclopirox (LOPROX) 0.77 % SUSP APPLY 1 PUMP TWICE A DAY. 60 mL 0  . dronabinol (MARINOL) 2.5 MG capsule Take 1 capsule (2.5 mg total) by mouth 2 (two) times daily before lunch and supper. 180 capsule 1  . famotidine (PEPCID) 40 MG tablet TAKE 1 TABLET ONCE DAILY. 90 tablet 1  . fexofenadine (ALLEGRA) 180 MG tablet Take 180 mg by mouth daily as needed for allergies.     Marland Kitchen ipratropium (ATROVENT) 0.03 % nasal spray USE 2 SPRAYS IN EACH NOSTRIL 3 TIMES A DAY AS NEEDED. 30 mL 0  . ketoconazole (NIZORAL) 2 % cream APPLY TO AFFECTED AREA TWICE A DAY. 60 g 0  . lamoTRIgine (LAMICTAL) 25 MG tablet TAKE 1 TABLET ONCE DAILY. 90 tablet 0  . metoprolol tartrate (LOPRESSOR) 25 MG tablet Take 1 tablet (25 mg total) by mouth 2 (two) times daily. 180 tablet 1  . modafinil (PROVIGIL) 100 MG tablet Take 1 tablet (100 mg total) by mouth daily. 30 tablet 5  . PRESCRIPTION MEDICATION CPAP    . rosuvastatin (CRESTOR) 5 MG tablet TAKE 1 TABLET ONCE DAILY. 90 tablet 0  . SYMBICORT 80-4.5 MCG/ACT inhaler USE 2 PUFFS TWICE A DAY. 30.6 g 5  . VIIBRYD 40 MG TABS TAKE 1 TABLET ONCE DAILY. 90 tablet 1  .  warfarin (COUMADIN) 2.5 MG tablet Take 1 tablet daily or as directed by anticoagulation clinic 35 tablet 3   No current facility-administered medications for this visit.    Allergies  Petrolatum-zinc oxide; Sulfa antibiotics; Sulfonamide derivatives; Tetanus toxoid; Tetanus toxoids; Other; Nsaids; and Tetanus toxoid, adsorbed  Electrocardiogram:  F.utter with pacing RBBB   Assessment and Plan  HOCM: post myectomy with complication LVOT gradient gone now VSD sealed post MI/myectomy complication Pacer  Thresholds ok f/u [place] ? Changed to VVIR mode  TV replacement  Mean gradient 5/ peak 8 mmHg with mild TR  Anticoagulation  Chronic afib with anticoagulation and rate control  A/CRF:  Renal US reviewed from 09/17/18 no obstruction  medical cortical disease f/u renal Cr improved from 2.6 To 1.35 on 10/18/19    F/U with EP check pacer in 6 months F/U with me with echo in a year  [employer]

## 2019-11-24 ENCOUNTER — Other Ambulatory Visit: Payer: Self-pay

## 2019-11-24 ENCOUNTER — Ambulatory Visit: Payer: Medicare PPO | Admitting: General Practice

## 2019-11-24 DIAGNOSIS — Z7901 Long term (current) use of anticoagulants: Secondary | ICD-10-CM | POA: Diagnosis not present

## 2019-11-24 DIAGNOSIS — I483 Typical atrial flutter: Secondary | ICD-10-CM

## 2019-11-24 LAB — POCT INR: INR: 1.9 — AB (ref 2.0–3.0)

## 2019-11-24 NOTE — Patient Instructions (Addendum)
Pre visit review using our clinic review tool, if applicable. No additional management support is needed unless otherwise documented below in the visit note.  Take 2 tablets today and then continue to take 1 tablet (2.5 mg) daily.  Re-check in 4 weeks.

## 2019-11-24 NOTE — Progress Notes (Signed)
I have reviewed and agree.

## 2019-11-25 DIAGNOSIS — N184 Chronic kidney disease, stage 4 (severe): Secondary | ICD-10-CM | POA: Diagnosis not present

## 2019-11-29 ENCOUNTER — Telehealth (INDEPENDENT_AMBULATORY_CARE_PROVIDER_SITE_OTHER): Payer: Medicare PPO | Admitting: Cardiovascular Disease

## 2019-11-29 VITALS — BP 138/69 | HR 67 | Temp 97.5°F | Wt 125.0 lb

## 2019-11-29 DIAGNOSIS — I421 Obstructive hypertrophic cardiomyopathy: Secondary | ICD-10-CM | POA: Diagnosis not present

## 2019-11-29 DIAGNOSIS — I480 Paroxysmal atrial fibrillation: Secondary | ICD-10-CM | POA: Diagnosis not present

## 2019-11-29 NOTE — Patient Instructions (Signed)

## 2019-12-11 DIAGNOSIS — N184 Chronic kidney disease, stage 4 (severe): Secondary | ICD-10-CM | POA: Diagnosis not present

## 2019-12-15 ENCOUNTER — Other Ambulatory Visit: Payer: Self-pay | Admitting: Internal Medicine

## 2019-12-15 DIAGNOSIS — K219 Gastro-esophageal reflux disease without esophagitis: Secondary | ICD-10-CM

## 2019-12-22 ENCOUNTER — Ambulatory Visit: Payer: Medicare PPO | Admitting: General Practice

## 2019-12-22 ENCOUNTER — Other Ambulatory Visit: Payer: Self-pay

## 2019-12-22 DIAGNOSIS — I483 Typical atrial flutter: Secondary | ICD-10-CM | POA: Diagnosis not present

## 2019-12-22 DIAGNOSIS — Z7901 Long term (current) use of anticoagulants: Secondary | ICD-10-CM

## 2019-12-22 LAB — POCT INR: INR: 1.6 — AB (ref 2.0–3.0)

## 2019-12-22 NOTE — Patient Instructions (Addendum)
Pre visit review using our clinic review tool, if applicable. No additional management support is needed unless otherwise documented below in the visit note.  Take 2 tablets today and then change dosage and take 1 tablet daily except 2 tablets on Wednesdays.  Re-check in 2 weeks.

## 2019-12-22 NOTE — Progress Notes (Signed)
I have reviewed and agree.

## 2020-01-05 ENCOUNTER — Ambulatory Visit (INDEPENDENT_AMBULATORY_CARE_PROVIDER_SITE_OTHER): Payer: Medicare PPO | Admitting: General Practice

## 2020-01-05 ENCOUNTER — Other Ambulatory Visit: Payer: Self-pay

## 2020-01-05 ENCOUNTER — Encounter: Payer: Self-pay | Admitting: Internal Medicine

## 2020-01-05 DIAGNOSIS — I483 Typical atrial flutter: Secondary | ICD-10-CM

## 2020-01-05 DIAGNOSIS — Z7901 Long term (current) use of anticoagulants: Secondary | ICD-10-CM | POA: Diagnosis not present

## 2020-01-05 LAB — POCT INR: INR: 2.2 (ref 2.0–3.0)

## 2020-01-05 NOTE — Progress Notes (Signed)
I have reviewed and agree.

## 2020-01-05 NOTE — Patient Instructions (Addendum)
Pre visit review using our clinic review tool, if applicable. No additional management support is needed unless otherwise documented below in the visit note.  Continue to take 1 tablet daily except 2 tablets on Wednesdays.  Re-check in 4 weeks.

## 2020-01-09 ENCOUNTER — Encounter: Payer: Self-pay | Admitting: Internal Medicine

## 2020-01-09 ENCOUNTER — Ambulatory Visit (INDEPENDENT_AMBULATORY_CARE_PROVIDER_SITE_OTHER)
Admission: RE | Admit: 2020-01-09 | Discharge: 2020-01-09 | Disposition: A | Discharge status: Home / Self Care | Payer: Medicare PPO | Source: Ambulatory Visit | Attending: Internal Medicine | Admitting: Internal Medicine

## 2020-01-09 ENCOUNTER — Other Ambulatory Visit: Payer: Self-pay

## 2020-01-09 ENCOUNTER — Ambulatory Visit: Payer: Medicare PPO | Admitting: Internal Medicine

## 2020-01-09 ENCOUNTER — Ambulatory Visit (INDEPENDENT_AMBULATORY_CARE_PROVIDER_SITE_OTHER): Payer: Medicare PPO

## 2020-01-09 VITALS — BP 144/72 | HR 65 | Temp 98.4°F | Resp 16 | Ht 63.0 in | Wt 142.0 lb

## 2020-01-09 DIAGNOSIS — S24109A Unspecified injury at unspecified level of thoracic spinal cord, initial encounter: Secondary | ICD-10-CM

## 2020-01-09 DIAGNOSIS — S0990XA Unspecified injury of head, initial encounter: Secondary | ICD-10-CM

## 2020-01-09 DIAGNOSIS — S199XXA Unspecified injury of neck, initial encounter: Secondary | ICD-10-CM

## 2020-01-09 DIAGNOSIS — M542 Cervicalgia: Secondary | ICD-10-CM | POA: Diagnosis not present

## 2020-01-09 DIAGNOSIS — S299XXA Unspecified injury of thorax, initial encounter: Secondary | ICD-10-CM | POA: Diagnosis not present

## 2020-01-09 DIAGNOSIS — R4182 Altered mental status, unspecified: Secondary | ICD-10-CM | POA: Diagnosis not present

## 2020-01-09 DIAGNOSIS — R270 Ataxia, unspecified: Secondary | ICD-10-CM | POA: Diagnosis not present

## 2020-01-09 DIAGNOSIS — M546 Pain in thoracic spine: Secondary | ICD-10-CM | POA: Diagnosis not present

## 2020-01-09 NOTE — Progress Notes (Signed)
Subjective:  Patient ID: Holly Simmons, female    DOB: [birth year]  Age: 79 y.o. MRN: [id number]  CC: Head Injury  This visit occurred during the SARS-CoV-2 public health emergency.  Safety protocols were in place, including screening questions prior to the visit, additional usage of staff PPE, and extensive cleaning of exam room while observing appropriate contact time as indicated for disinfecting solutions.    HPI Holly Simmons presents for concern about a recent head injury - She is with her children today.  They have noted over the last few months that she has had worsening ataxia with frequent falls.  Her most severe fall occurred about a week ago when she struck the back of her head.  She had a couple of days of nausea and a contusion was noted on the back of her head but she never lost consciousness and has not had any vomiting.  She is not complaining about pain but there is a bruise over her upper back as well as her posterior scalp.  Outpatient Medications Prior to Visit  Medication Sig Dispense Refill  . allopurinol (ZYLOPRIM) 100 MG tablet TAKE 1 TABLET BY MOUTH DAILY. 90 tablet 0  . B Complex-C (B-COMPLEX WITH VITAMIN C) tablet Take 1 tablet by mouth daily.    . Betamethasone Valerate 0.12 % foam APPLY TO SCALP QD (Patient taking differently: Apply 1 application topically once a week. Apply to scalp.) 100 g 2  . buPROPion (WELLBUTRIN XL) 300 MG 24 hr tablet TAKE 1 TABLET ONCE DAILY. 90 tablet 1  . ciclopirox (LOPROX) 0.77 % SUSP APPLY 1 PUMP TWICE A DAY. 60 mL 0  . dronabinol (MARINOL) 2.5 MG capsule Take 1 capsule (2.5 mg total) by mouth 2 (two) times daily before lunch and supper. 180 capsule 1  . famotidine (PEPCID) 40 MG tablet TAKE 1 TABLET ONCE DAILY. 90 tablet 0  . fexofenadine (ALLEGRA) 180 MG tablet Take 180 mg by mouth daily as needed for allergies.     Marland Kitchen ipratropium (ATROVENT) 0.03 % nasal spray USE 2 SPRAYS IN EACH NOSTRIL 3 TIMES A DAY AS NEEDED. 30  mL 0  . ketoconazole (NIZORAL) 2 % cream APPLY TO AFFECTED AREA TWICE A DAY. 60 g 0  . lamoTRIgine (LAMICTAL) 25 MG tablet TAKE 1 TABLET ONCE DAILY. 90 tablet 0  . metoprolol tartrate (LOPRESSOR) 25 MG tablet Take 1 tablet (25 mg total) by mouth 2 (two) times daily. 180 tablet 1  . modafinil (PROVIGIL) 100 MG tablet Take 1 tablet (100 mg total) by mouth daily. (Patient taking differently: Take 100 mg by mouth daily as needed. ) 30 tablet 5  . PRESCRIPTION MEDICATION CPAP    . rosuvastatin (CRESTOR) 5 MG tablet TAKE 1 TABLET ONCE DAILY. 90 tablet 0  . SYMBICORT 80-4.5 MCG/ACT inhaler USE 2 PUFFS TWICE A DAY. 30.6 g 5  . VIIBRYD 40 MG TABS TAKE 1 TABLET ONCE DAILY. 90 tablet 0  . warfarin (COUMADIN) 2.5 MG tablet Take 1 tablet daily or as directed by anticoagulation clinic 35 tablet 3   No facility-administered medications prior to visit.    ROS Review of Systems  Eyes: Negative.   Respiratory: Negative.  Negative for cough, chest tightness, shortness of breath and wheezing.   Cardiovascular: Negative for chest pain, palpitations and leg swelling.  Gastrointestinal: Positive for nausea. Negative for abdominal pain, constipation, diarrhea and vomiting.  Endocrine: Negative.   Genitourinary: Negative.   Musculoskeletal: Positive for gait problem. Negative  for back pain, myalgias and neck pain.  Skin: Negative.   Neurological: Positive for syncope. Negative for dizziness, seizures, facial asymmetry, speech difficulty, weakness and numbness.  Hematological: Negative for adenopathy. Bruises/bleeds easily.  Psychiatric/Behavioral: Negative.     Objective:  BP (!) 144/72 (BP Location: Left Arm, Patient Position: Sitting, Cuff Size: Normal)   Pulse 65   Temp 98.4 F (36.9 C) (Oral)   Resp 16   Ht 5\' 3"  (1.6 m)   Wt 142 lb (64.4 kg)   SpO2 95%   BMI 25.15 kg/m   BP Readings from Last 3 Encounters:  01/09/20 (!) 144/72  01/09/20 (!) 144/72  11/29/19 138/69    Wt Readings from Last  3 Encounters:  01/09/20 142 lb (64.4 kg)  01/09/20 142 lb (64.4 kg)  11/29/19 125 lb (56.7 kg)    Physical Exam Vitals reviewed.  Constitutional:      Appearance: She is ill-appearing.  HENT:     Head: Contusion present. No raccoon eyes, Battle's sign, abrasion or laceration.      Nose: Nose normal.     Mouth/Throat:     Mouth: Mucous membranes are moist.  Eyes:     General: No scleral icterus.    Extraocular Movements: Extraocular movements intact.     Pupils: Pupils are equal, round, and reactive to light.  Neck:   Cardiovascular:     Rate and Rhythm: Normal rate and regular rhythm.     Heart sounds: Murmur present.  Pulmonary:     Effort: Pulmonary effort is normal.     Breath sounds: No stridor. No wheezing, rhonchi or rales.  Abdominal:     General: Abdomen is flat. Bowel sounds are normal. There is no distension.     Palpations: Abdomen is soft. There is no hepatomegaly, splenomegaly or mass.     Tenderness: There is no abdominal tenderness.  Musculoskeletal:        General: No swelling, tenderness or signs of injury. Normal range of motion.     Cervical back: Normal range of motion and neck supple. Signs of trauma present. No swelling, edema, rigidity, spasms, tenderness, bony tenderness or crepitus. No pain with movement. Normal range of motion.     Thoracic back: Signs of trauma present. No swelling, edema, deformity or bony tenderness. Normal range of motion.     Lumbar back: Normal.     Right lower leg: No edema.     Left lower leg: No edema.  Skin:    General: Skin is warm and dry.     Coloration: Skin is not pale.  Neurological:     Mental Status: She is alert.     Cranial Nerves: Cranial nerves are intact. No dysarthria.     Motor: Weakness and abnormal muscle tone present.     Coordination: Romberg sign positive. Coordination abnormal. Heel to Shin Test abnormal. Finger-Nose-Finger Test normal. Impaired rapid alternating movements.     Gait: Gait  abnormal.     Deep Tendon Reflexes: Reflexes normal. Babinski sign present on the right side. Babinski sign present on the left side.     Reflex Scores:      Tricep reflexes are 0 on the right side and 0 on the left side.      Bicep reflexes are 0 on the right side and 0 on the left side.      Brachioradialis reflexes are 0 on the right side and 0 on the left side.      Patellar  reflexes are 0 on the right side and 0 on the left side.      Achilles reflexes are 0 on the right side and 0 on the left side. Psychiatric:        Behavior: Behavior normal.     Lab Results  Component Value Date   WBC 6.3 10/18/2019   HGB 12.0 10/18/2019   HCT 37.3 10/18/2019   PLT 155 10/18/2019   GLUCOSE 87 10/18/2019   CHOL 148 02/15/2019   TRIG 80.0 02/15/2019   HDL 58.10 02/15/2019   LDLCALC 74 02/15/2019   ALT 15 10/18/2019   AST 26 10/18/2019   NA 138 10/18/2019   K 3.8 10/18/2019   CL 102 10/18/2019   CREATININE 1.35 (H) 10/18/2019   BUN 15 10/18/2019   CO2 26 10/18/2019   TSH 1.36 10/07/2019   INR 2.2 01/05/2020   HGBA1C 6.3 10/07/2019   MICROALBUR 63.9 (H) 04/20/2018    US RENAL  Result Date: 11/01/2019 CLINICAL DATA:  Chronic kidney disease, stage IV. EXAM: RENAL / URINARY TRACT ULTRASOUND COMPLETE COMPARISON:  None. FINDINGS: Right Kidney: Renal measurements: 11.6 x 4.6 x 5.3 = volume: 149 mL. Diffuse cortical thinning is noted. The renal parenchyma is isoechoic to the index organ, the liver. Extrarenal pelvis is again noted on the right. No hydronephrosis is present Left Kidney: Renal measurements: 10.0 x 5.7 x 4.7 = volume: 141 mL. Diffuse cortical thinning is noted. The renal parenchyma is isoechoic to the index organ, the liver. 1.5 cm cyst is again noted at the lower pole. Bladder: Appears normal for degree of bladder distention. Other: None. IMPRESSION: 1. Similar appearance of diffuse cortical thinning and mildly echogenic kidneys compatible with chronic medical renal disease. 2. No  acute abnormality. Electronically Signed   By: San Morelle M.D.   On: 11/01/2019 16:56   NM Parathyroid W/Spect  Result Date: 11/01/2019 CLINICAL DATA:  Secondary hyperparathyroidism of renal origin, COPD, stage III chronic kidney disease, hypertrophic cardiomyopathy, diabetes mellitus, hypertension, prior TAVR, atrial flutter EXAM: NM PARATHYROID SCINTIGRAPHY AND SPECT IMAGING TECHNIQUE: Following intravenous administration of radiopharmaceutical, early and 2-hour delayed planar images were obtained in the anterior projection. Delayed triplanar SPECT images were also obtained at 2 hours. RADIOPHARMACEUTICALS:  26.8 mCi Tc-18m Sestamibi IV COMPARISON:  None FINDINGS: Planar imaging: Normal initial distribution of sestamibi in the thyroid lobes. Normal washout of tracer from thyroid tissue on 2 hour delayed image. No abnormal focal sestamibi retention seen to suggest parathyroid adenoma. Photopenic defect from AICD generator LEFT chest. No ectopic localization of tracer in the mediastinum. SPECT imaging: Mild diffuse retention of tracer in the thyroid lobes. No definite focal wall increased sestamibi retention is identified to suggest parathyroid adenoma. No ectopic localization of tracer. IMPRESSION: Negative parathyroid scan. Electronically Signed   By: Lavonia Dana M.D.   On: 11/01/2019 12:22   DG Cervical Spine Complete  Result Date: 01/09/2020 CLINICAL DATA:  Fall 5 days ago, pain EXAM: CERVICAL SPINE - COMPLETE 4+ VIEW COMPARISON:  None. FINDINGS: Reversal the normal cervical lordosis. No evidence of fracture or dislocation. Vertebral body heights and intervertebral disc spaces are maintained. Dens appears intact. Lateral masses C1 are symmetric. No prevertebral soft tissue swelling. Mild degenerative changes at C4-6. Evaluation of the bilateral neural foramina is constrained by obliquity. Visualized lung apices are clear. IMPRESSION: No fracture or dislocation is seen. Mild degenerative changes  of the mid cervical spine. Electronically Signed   By: Julian Hy M.D.   On: 01/09/2020 10:07  DG Thoracic Spine W/Swimmers  Result Date: 01/09/2020 CLINICAL DATA:  Fall 5 days ago, pain EXAM: THORACIC SPINE - 3 VIEWS COMPARISON:  None. FINDINGS: Normal thoracic kyphosis. No evidence of fracture or dislocation. Vertebral body heights and intervertebral disc spaces are maintained. Visualized lungs are clear. Mild cardiomegaly. Pacemaker leads. Postsurgical changes related to prior CABG. Median sternotomy. IMPRESSION: Negative. Electronically Signed   By: Julian Hy M.D.   On: 01/09/2020 10:08   DG Cervical Spine Complete  Result Date: 01/09/2020 CLINICAL DATA:  Fall 5 days ago, pain EXAM: CERVICAL SPINE - COMPLETE 4+ VIEW COMPARISON:  None. FINDINGS: Reversal the normal cervical lordosis. No evidence of fracture or dislocation. Vertebral body heights and intervertebral disc spaces are maintained. Dens appears intact. Lateral masses C1 are symmetric. No prevertebral soft tissue swelling. Mild degenerative changes at C4-6. Evaluation of the bilateral neural foramina is constrained by obliquity. Visualized lung apices are clear. IMPRESSION: No fracture or dislocation is seen. Mild degenerative changes of the mid cervical spine. Electronically Signed   By: Julian Hy M.D.   On: 01/09/2020 10:07   DG Thoracic Spine W/Swimmers  Result Date: 01/09/2020 CLINICAL DATA:  Fall 5 days ago, pain EXAM: THORACIC SPINE - 3 VIEWS COMPARISON:  None. FINDINGS: Normal thoracic kyphosis. No evidence of fracture or dislocation. Vertebral body heights and intervertebral disc spaces are maintained. Visualized lungs are clear. Mild cardiomegaly. Pacemaker leads. Postsurgical changes related to prior CABG. Median sternotomy. IMPRESSION: Negative. Electronically Signed   By: Julian Hy M.D.   On: 01/09/2020 10:08   CT Head Wo Contrast  Result Date: 01/09/2020 CLINICAL DATA:  Altered mental status with  ataxia and several recent falls EXAM: CT HEAD WITHOUT CONTRAST TECHNIQUE: Contiguous axial images were obtained from the base of the skull through the vertex without intravenous contrast. COMPARISON:  None. FINDINGS: Brain: There is age related volume loss in the supratentorial regions. There is moderate cerebellar atrophy. There is no intracranial mass, hemorrhage, extra-axial fluid collection, or midline shift. There is patchy small vessel disease throughout the centra semiovale bilaterally. No acute appearing infarct is appreciable. Vascular: No hyperdense vessel. There is calcification in each carotid siphon region. Skull: Bony calvarium appears intact. Sinuses/Orbits: Visualized paranasal sinuses are clear. Visualized orbits appear symmetric bilaterally. Other: Mastoid air cells are clear. IMPRESSION: Moderate cerebellar atrophy. Patchy small vessel disease in the periventricular white matter bilaterally noted. No acute infarct. No mass or hemorrhage. There are multiple foci of arterial vascular calcification. Electronically Signed   By: Lowella Grip III M.D.   On: 01/09/2020 13:45    Assessment & Plan:   [place] was seen today for head injury.  Diagnoses and all orders for this visit:  Traumatic injury of head, initial encounter- She has had a recent head injury.  She is anticoagulated with Coumadin.  She has abnormal neurological symptoms and exam.  I recommended a stat CT of the brain without contrast to see if there is a fracture, bleed or subdural hematoma. - Additional note.  CT scan returns and is negative for subdural hematoma, intracranial hemorrhage, or skull fracture.  It does show atrophy which I think explains her ataxia and frequent falls. -     CT Head Wo Contrast; Future  Ataxia after head trauma- See above. -     CT Head Wo Contrast; Future  Neck injury, initial encounter- Plain films are negative for fracture and she is not complaining of discomfort. -     DG Cervical Spine  Complete; Future -  DG Thoracic Spine W/Swimmers; Future  Injury of thoracic spine, initial encounter ([place])- Plain films are negative for fracture and she is not complaining of pain. -     DG Thoracic Spine W/Swimmers; Future   I am having Sadiyah L. Muma maintain her fexofenadine, B-complex with vitamin C, PRESCRIPTION MEDICATION, Betamethasone Valerate, modafinil, ipratropium, dronabinol, ketoconazole, rosuvastatin, buPROPion, Symbicort, metoprolol tartrate, warfarin, ciclopirox, lamoTRIgine, famotidine, allopurinol, and Viibryd.  No orders of the defined types were placed in this encounter.  I spent 60 minutes in preparing to see the patient by review of recent labs, imaging and procedures, obtaining and reviewing separately obtained history, communicating with the patient and family or caregiver, ordering medications, tests or procedures, and documenting clinical information in the EHR including the differential Dx, treatment, and any further evaluation and other management of 1. Traumatic injury of head, initial encounter 2. Ataxia after head trauma 3. Neck injury, initial encounter 4. Injury of thoracic spine, initial encounter [facility])      Follow-up: No follow-ups on file.  Scarlette Calico, MD

## 2020-01-09 NOTE — Progress Notes (Signed)
  This encounter was created in error - please disregard. Patient was not seen for Medicare Wellness Visit; was sent out for imaging

## 2020-01-09 NOTE — Patient Instructions (Signed)
[facility] Neu[facility] (pp. [phone]). [place], [place]. Elsevier."> Neurosurgery, 80(1), 6-15. Retrieved on January 23, 2019.[email or web address]"> Primary Care (5th ed., pp. 218-221). [place], [place]: Elsevier."> Brain Injury, 29(6), [phone]. [email or web address]"> Rosen's Emergency Medicine: Concepts and Clinical Practice (9th ed., pp. 301-329). [place], [place]: Elsevier.">  Head Injury, Adult There are many types of head injuries. Head injuries can be as minor as a bump, or they can be a serious medical issue. More severe head injuries include:  A jarring injury to the brain (concussion).  A bruise (contusion) of the brain. This means there is bleeding in the brain that can cause swelling.  A cracked skull (skull fracture).  Bleeding in the brain that collects, clots, and forms a bump (hematoma). After a head injury, most problems occur within the first 24 hours, but side effects may occur up to 7-10 days after the injury. It is important to watch your condition for any changes. You may need to be observed in the emergency department or urgent care, or you may be admitted to the hospital. What are the causes? There are many possible causes of a head injury. A serious head injury may be caused by a car accident, bicycle or motorcycle accidents, sports injuries, and falls. What are the symptoms? Symptoms of a head injury include a contusion, bump, or bleeding at the site of the injury. Other physical symptoms may include:  Headache.  Nausea or vomiting.  Dizziness.  Feeling tired.  Being uncomfortable around bright lights or loud noises.  Seizures.  Trouble being awakened.  Fainting. Mental or emotional symptoms may include:  Irritability.  Confusion and memory problems.  Poor attention and concentration.  Changes in eating or sleeping habits.  Anxiety or depression. How is this diagnosed? This condition can  usually be diagnosed based on your symptoms, a description of the injury, and a physical exam. You may also have imaging tests done, such as a CT scan or MRI. How is this treated? Treatment for this condition depends on the severity and type of injury you have. The main goal of treatment is to prevent complications and to allow the brain time to heal. Mild head injury If you have a mild head injury, you may be sent home and treatment may include:  Observation. A responsible adult should stay with you for 24 hours after your injury and check on you often.  Physical rest.  Brain rest.  Pain medicines. Severe head injury If you have a severe head injury, treatment may include:  Close observation. This includes hospitalization with frequent physical exams.  Medicines to relieve pain, prevent seizures, and decrease brain swelling.  Breathing support. This may include using a ventilator.  Treatments to manage the swelling inside the brain.  Brain surgery. This may be needed to: ? Remove a blood clot. ? Stop the bleeding. ? Remove a part of the skull to allow room for the brain to swell. Follow these instructions at home: Activity  Rest and avoid activities that are physically hard or tiring.  Make sure you get enough sleep.  Limit activities that require a lot of thought or attention, such as: ? Watching TV. ? Playing memory games and puzzles. ? Job-related work or homework. ? Working on [employer], and texting.  Avoid activities that could cause another head injury, such as playing sports, until your health care provider approves. Having another head injury, especially before the first one has healed, can be dangerous.  Ask your health care  provider when it is safe for you to return to your regular activities, including work or school. Ask your health care provider for a step-by-step plan for gradually returning to activities.  Ask your health care  provider when you can drive, ride a bicycle, or use heavy machinery. Your ability to react may be slower after a brain injury. Do not do these activities if you are dizzy. Lifestyle   Do not drink alcohol until your health care provider approves. Do not use drugs. Alcohol and certain drugs may slow your recovery and can put you at risk of further injury.  If it is harder than usual to remember things, write them down.  If you are easily distracted, try to do one thing at a time.  Talk with family members or close friends when making important decisions.  Tell your friends, family, a trusted colleague, and work Fre[employer] about your injury, symptoms, and restrictions. Have them watch for any new or worsening problems. General instructions  Take over-the-counter and prescription medicines only as told by your health care provider.  Have someone stay with you for 24 hours after your head injury. This person should watch you for any changes in your symptoms and be ready to seek medical help.  Keep all follow-up visits as told by your health care provider. This is important. How is this prevented?  Work on improving your balance and strength to avoid falls.  Wear a seatbelt when you are in a moving vehicle.  Wear a helmet when riding a bicycle, skiing, or doing any other sport or activity that has a risk of injury.  If you drink alcohol: ? Limit how much you use to:  0-1 drink a day for women.  0-2 drinks a day for men. ? Be aware of how much alcohol is in your drink. In the [place] one drink equals one 12 oz bottle of beer (355 mL), one 5 oz glass of wine (148 mL), or one 1 oz glass of hard liquor (44 mL).  Take safety measures in your home, such as: ? Removing clutter and tripping hazards from floors and stairways. ? Using grab bars in bathrooms and handrails by stairs. ? Placing non-slip mats on floors and in bathtubs. ? Improving lighting in dim areas. Get help right away  if:  You have: ? A severe headache that is not helped by medicine. ? Trouble walking or weakness in your arms and legs. ? Clear or bloody fluid coming from your nose or ears. ? Changes in your vision. ? A seizure.  You lose your balance.  You vomit.  Your pupils change size.  Your speech is slurred.  Your dizziness gets worse.  You faint.  You are sleepier than normal and have trouble staying awake.  Your symptoms get worse. These symptoms may represent a serious problem that is an emergency. Do not wait to see if the symptoms will go away. Get medical help right away. Call your local emergency services (911 in the [place] Do not drive yourself to the hospital. Summary  Head injuries can be minor or they can be a serious medical issue requiring immediate attention.  Treatment for this condition depends on the severity and type of injury you have.  Ask your health care provider when it is safe for you to return to your regular activities, including work or school.  Head injury prevention includes wearing a seat belt in a motor vehicle, using a helmet on a bicycle, limiting alcohol  use, and taking safety measures in your home. This information is not intended to replace advice given to you by your health care provider. Make sure you discuss any questions you have with your health care provider. Document Revised: 08/18/2018 Document Reviewed: 08/13/2018 Elsevier Patient Education  [place].

## 2020-01-09 NOTE — Patient Instructions (Signed)
Holly Simmons , Thank you for taking time to come for your Medicare Wellness Visit. I appreciate your ongoing commitment to your health goals. Please review the following plan we discussed and let me know if I can assist you in the future.   Screening recommendations/referrals: Colonoscopy: last done 05/07/2014; overdue 05/2019 Mammogram: last done 04/25/2019;due 04/2020 Bone Density: last done 06/20/2015 Recommended yearly ophthalmology/optometry visit for glaucoma screening and checkup Recommended yearly dental visit for hygiene and checkup  Vaccinations: Influenza vaccine: 04/04/2019 Pneumococcal vaccine: completed Tdap vaccine: never done Shingles vaccine: completed   Covid-19: completed  Advanced directives: Yes  Conditions/risks identified: Yes  Next appointment: Please schedule your 1 year Medicare Wellness Visit with your Health Coach.  Preventive Care 65 Years and Older, Female Preventive care refers to lifestyle choices and visits with your health care provider that can promote health and wellness. What does preventive care include?  A yearly physical exam. This is also called an annual well check.  Dental exams once or twice a year.  Routine eye exams. Ask your health care provider how often you should have your eyes checked.  Personal lifestyle choices, including:  Daily care of your teeth and gums.  Regular physical activity.  Eating a healthy diet.  Avoiding tobacco and drug use.  Limiting alcohol use.  Practicing safe sex.  Taking low-dose aspirin every day.  Taking vitamin and mineral supplements as recommended by your health care provider. What happens during an annual well check? The services and screenings done by your health care provider during your annual well check will depend on your age, overall health, lifestyle risk factors, and family history of disease. Counseling  Your health care provider may ask you questions about your:  Alcohol  use.  Tobacco use.  Drug use.  Emotional well-being.  Home and relationship well-being.  Sexual activity.  Eating habits.  History of falls.  Memory and ability to understand (cognition).  Work and work Statistician.  Reproductive health. Screening  You may have the following tests or measurements:  Height, weight, and BMI.  Blood pressure.  Lipid and cholesterol levels. These may be checked every 5 years, or more frequently if you are over 60 years old.  Skin check.  Lung cancer screening. You may have this screening every year starting at age 47 if you have a 30-pack-year history of smoking and currently smoke or have quit within the past 15 years.  Fecal occult blood test (FOBT) of the stool. You may have this test every year starting at age 22.  Flexible sigmoidoscopy or colonoscopy. You may have a sigmoidoscopy every 5 years or a colonoscopy every 10 years starting at age 39.  Hepatitis C blood test.  Hepatitis B blood test.  Sexually transmitted disease (STD) testing.  Diabetes screening. This is done by checking your blood sugar (glucose) after you have not eaten for a while (fasting). You may have this done every 1-3 years.  Bone density scan. This is done to screen for osteoporosis. You may have this done starting at age 40.  Mammogram. This may be done every 1-2 years. Talk to your health care provider about how often you should have regular mammograms. Talk with your health care provider about your test results, treatment options, and if necessary, the need for more tests. Vaccines  Your health care provider may recommend certain vaccines, such as:  Influenza vaccine. This is recommended every year.  Tetanus, diphtheria, and acellular pertussis (Tdap, Td) vaccine. You may need a  Td booster every 10 years.  Zoster vaccine. You may need this after age 33.  Pneumococcal 13-valent conjugate (PCV13) vaccine. One dose is recommended after age  79.  Pneumococcal polysaccharide (PPSV23) vaccine. One dose is recommended after age 65. Talk to your health care provider about which screenings and vaccines you need and how often you need them. This information is not intended to replace advice given to you by your health care provider. Make sure you discuss any questions you have with your health care provider. Document Released: 08/17/2015 Document Revised: 04/09/2016 Document Reviewed: 05/22/2015 Elsevier Interactive Patient Education  2017 [employer] Prevention in the Home Falls can cause injuries. They can happen to people of all ages. There are many things you can do to make your home safe and to help prevent falls. What can I do on the outside of my home?  Regularly fix the edges of walkways and driveways and fix any cracks.  Remove anything that might make you trip as you walk through a door, such as a raised step or threshold.  Trim any bushes or trees on the path to your home.  Use bright outdoor lighting.  Clear any walking paths of anything that might make someone trip, such as rocks or tools.  Regularly check to see if handrails are loose or broken. Make sure that both sides of any steps have handrails.  Any raised decks and porches should have guardrails on the edges.  Have any leaves, snow, or ice cleared regularly.  Use sand or salt on walking paths during winter.  Clean up any spills in your garage right away. This includes oil or grease spills. What can I do in the bathroom?  Use night lights.  Install grab bars by the toilet and in the tub and shower. Do not use towel bars as grab bars.  Use non-skid mats or decals in the tub or shower.  If you need to sit down in the shower, use a plastic, non-slip stool.  Keep the floor dry. Clean up any water that spills on the floor as soon as it happens.  Remove soap buildup in the tub or shower regularly.  Attach bath mats securely with double-sided  non-slip rug tape.  Do not have throw rugs and other things on the floor that can make you trip. What can I do in the bedroom?  Use night lights.  Make sure that you have a light by your bed that is easy to reach.  Do not use any sheets or blankets that are too big for your bed. They should not hang down onto the floor.  Have a firm chair that has side arms. You can use this for support while you get dressed.  Do not have throw rugs and other things on the floor that can make you trip. What can I do in the kitchen?  Clean up any spills right away.  Avoid walking on wet floors.  Keep items that you use a lot in easy-to-reach places.  If you need to reach something above you, use a strong step stool that has a grab bar.  Keep electrical cords out of the way.  Do not use floor polish or wax that makes floors slippery. If you must use wax, use non-skid floor wax.  Do not have throw rugs and other things on the floor that can make you trip. What can I do with my stairs?  Do not leave any items on the stairs.  Make sure that there are handrails on both sides of the stairs and use them. Fix handrails that are broken or loose. Make sure that handrails are as long as the stairways.  Check any carpeting to make sure that it is firmly attached to the stairs. Fix any carpet that is loose or worn.  Avoid having throw rugs at the top or bottom of the stairs. If you do have throw rugs, attach them to the floor with carpet tape.  Make sure that you have a light switch at the top of the stairs and the bottom of the stairs. If you do not have them, ask someone to add them for you. What else can I do to help prevent falls?  Wear shoes that:  Do not have high heels.  Have rubber bottoms.  Are comfortable and fit you well.  Are closed at the toe. Do not wear sandals.  If you use a stepladder:  Make sure that it is fully opened. Do not climb a closed stepladder.  Make sure that both  sides of the stepladder are locked into place.  Ask someone to hold it for you, if possible.  Clearly mark and make sure that you can see:  Any grab bars or handrails.  First and last steps.  Where the edge of each step is.  Use tools that help you move around (mobility aids) if they are needed. These include:  Canes.  Walkers.  Scooters.  Crutches.  Turn on the lights when you go into a dark area. Replace any light bulbs as soon as they burn out.  Set up your furniture so you have a clear path. Avoid moving your furniture around.  If any of your floors are uneven, fix them.  If there are any pets around you, be aware of where they are.  Review your medicines with your doctor. Some medicines can make you feel dizzy. This can increase your chance of falling. Ask your doctor what other things that you can do to help prevent falls. This information is not intended to replace advice given to you by your health care provider. Make sure you discuss any questions you have with your health care provider. Document Released: 05/17/2009 Document Revised: 12/27/2015 Document Reviewed: 08/25/2014 Elsevier Interactive Patient Education  2017 [employer].

## 2020-01-10 ENCOUNTER — Ambulatory Visit (INDEPENDENT_AMBULATORY_CARE_PROVIDER_SITE_OTHER): Payer: Medicare PPO | Admitting: *Deleted

## 2020-01-10 DIAGNOSIS — I442 Atrioventricular block, complete: Secondary | ICD-10-CM

## 2020-01-10 LAB — CUP PACEART REMOTE DEVICE CHECK
Battery Impedance: 865 Ohm
Battery Remaining Longevity: 63 mo
Battery Voltage: 2.77 V
Brady Statistic AP VP Percent: 47 %
Brady Statistic AP VS Percent: 0 %
Brady Statistic AS VP Percent: 52 %
Brady Statistic AS VS Percent: 0 %
Date Time Interrogation Session: 20210608125942
Implantable Lead Implant Date: 20131210
Implantable Lead Implant Date: 20131210
Implantable Lead Location: 753858
Implantable Lead Location: 753859
Implantable Lead Model: 4296
Implantable Lead Model: 5076
Implantable Pulse Generator Implant Date: 20131210
Lead Channel Impedance Value: 441 Ohm
Lead Channel Impedance Value: 843 Ohm
Lead Channel Pacing Threshold Amplitude: 0.875 V
Lead Channel Pacing Threshold Amplitude: 1.125 V
Lead Channel Pacing Threshold Pulse Width: 0.4 ms
Lead Channel Pacing Threshold Pulse Width: 0.4 ms
Lead Channel Setting Pacing Amplitude: 2 V
Lead Channel Setting Pacing Amplitude: 2.5 V
Lead Channel Setting Pacing Pulse Width: 0.4 ms
Lead Channel Setting Sensing Sensitivity: 8 mV

## 2020-01-11 NOTE — Progress Notes (Signed)
Remote pacemaker transmission.   

## 2020-02-02 ENCOUNTER — Ambulatory Visit: Payer: Medicare PPO | Admitting: General Practice

## 2020-02-02 ENCOUNTER — Other Ambulatory Visit: Payer: Self-pay

## 2020-02-02 DIAGNOSIS — Z7901 Long term (current) use of anticoagulants: Secondary | ICD-10-CM

## 2020-02-02 DIAGNOSIS — I4892 Unspecified atrial flutter: Secondary | ICD-10-CM

## 2020-02-02 LAB — POCT INR: INR: 1.7 — AB (ref 2.0–3.0)

## 2020-02-02 NOTE — Patient Instructions (Addendum)
Pre visit review using our clinic review tool, if applicable. No additional management support is needed unless otherwise documented below in the visit note.  Take 2 tablets today and then continue to take 1 tablet daily except 2 tablets on Wednesdays.  Re-check in 4 weeks.

## 2020-02-02 NOTE — Progress Notes (Signed)
I have reviewed and agree.

## 2020-02-14 ENCOUNTER — Other Ambulatory Visit: Payer: Self-pay | Admitting: Internal Medicine

## 2020-02-14 DIAGNOSIS — F418 Other specified anxiety disorders: Secondary | ICD-10-CM

## 2020-02-14 DIAGNOSIS — R64 Cachexia: Secondary | ICD-10-CM

## 2020-02-14 DIAGNOSIS — Z7901 Long term (current) use of anticoagulants: Secondary | ICD-10-CM

## 2020-02-14 DIAGNOSIS — F3341 Major depressive disorder, recurrent, in partial remission: Secondary | ICD-10-CM

## 2020-02-27 DIAGNOSIS — N184 Chronic kidney disease, stage 4 (severe): Secondary | ICD-10-CM | POA: Diagnosis not present

## 2020-02-28 LAB — BASIC METABOLIC PANEL
CO2: 26 — AB (ref 13–22)
Chloride: 101 (ref 99–108)
Creatinine: 1.6 — AB (ref 0.5–1.1)
Glucose: 140
Potassium: 4.1 (ref 3.4–5.3)
Sodium: 139 (ref 137–147)

## 2020-02-28 LAB — COMPREHENSIVE METABOLIC PANEL
Calcium: 10.5 (ref 8.7–10.7)
GFR calc non Af Amer: 32

## 2020-02-28 LAB — IRON,TIBC AND FERRITIN PANEL
%SAT: 12
Ferritin: 47
Iron: 37
TIBC: 313
UIBC: 276

## 2020-02-28 LAB — CBC AND DIFFERENTIAL: Hemoglobin: 10 — AB (ref 12.0–16.0)

## 2020-02-28 LAB — VITAMIN D 25 HYDROXY (VIT D DEFICIENCY, FRACTURES): Vit D, 25-Hydroxy: 41.9

## 2020-03-01 ENCOUNTER — Ambulatory Visit: Payer: Medicare PPO | Admitting: General Practice

## 2020-03-01 ENCOUNTER — Other Ambulatory Visit: Payer: Self-pay

## 2020-03-01 ENCOUNTER — Ambulatory Visit: Payer: Medicare PPO

## 2020-03-01 DIAGNOSIS — I4892 Unspecified atrial flutter: Secondary | ICD-10-CM

## 2020-03-01 DIAGNOSIS — Z7901 Long term (current) use of anticoagulants: Secondary | ICD-10-CM

## 2020-03-01 LAB — POCT INR: INR: 1.5 — AB (ref 2.0–3.0)

## 2020-03-01 NOTE — Patient Instructions (Signed)
Pre visit review using our clinic review tool, if applicable. No additional management support is needed unless otherwise documented below in the visit note.  Take 2 tablets today and tomorrow and then change dosage and take 1 tablet except 2 tablets on Mondays and Fridays. Re-check in 2 weeks.

## 2020-03-02 NOTE — Progress Notes (Signed)
I have reviewed and agree.

## 2020-03-05 DIAGNOSIS — R809 Proteinuria, unspecified: Secondary | ICD-10-CM | POA: Diagnosis not present

## 2020-03-05 DIAGNOSIS — I129 Hypertensive chronic kidney disease with stage 1 through stage 4 chronic kidney disease, or unspecified chronic kidney disease: Secondary | ICD-10-CM | POA: Diagnosis not present

## 2020-03-05 DIAGNOSIS — N2581 Secondary hyperparathyroidism of renal origin: Secondary | ICD-10-CM | POA: Diagnosis not present

## 2020-03-05 DIAGNOSIS — I4891 Unspecified atrial fibrillation: Secondary | ICD-10-CM | POA: Diagnosis not present

## 2020-03-05 DIAGNOSIS — D631 Anemia in chronic kidney disease: Secondary | ICD-10-CM | POA: Diagnosis not present

## 2020-03-05 DIAGNOSIS — N1832 Chronic kidney disease, stage 3b: Secondary | ICD-10-CM | POA: Diagnosis not present

## 2020-03-07 ENCOUNTER — Encounter: Payer: Self-pay | Admitting: Internal Medicine

## 2020-03-07 ENCOUNTER — Ambulatory Visit (INDEPENDENT_AMBULATORY_CARE_PROVIDER_SITE_OTHER): Payer: Medicare PPO

## 2020-03-07 ENCOUNTER — Ambulatory Visit: Payer: Medicare PPO | Admitting: Internal Medicine

## 2020-03-07 ENCOUNTER — Other Ambulatory Visit: Payer: Self-pay

## 2020-03-07 VITALS — BP 152/50 | HR 96 | Temp 98.1°F | Resp 16 | Ht 64.0 in | Wt 147.0 lb

## 2020-03-07 DIAGNOSIS — E118 Type 2 diabetes mellitus with unspecified complications: Secondary | ICD-10-CM

## 2020-03-07 DIAGNOSIS — N185 Chronic kidney disease, stage 5: Secondary | ICD-10-CM | POA: Diagnosis not present

## 2020-03-07 DIAGNOSIS — Z0000 Encounter for general adult medical examination without abnormal findings: Secondary | ICD-10-CM | POA: Diagnosis not present

## 2020-03-07 DIAGNOSIS — R64 Cachexia: Secondary | ICD-10-CM | POA: Diagnosis not present

## 2020-03-07 LAB — POCT GLYCOSYLATED HEMOGLOBIN (HGB A1C): Hemoglobin A1C: 5.3 % (ref 4.0–5.6)

## 2020-03-07 NOTE — Progress Notes (Signed)
Subjective:  Patient ID: Holly Simmons, female    DOB: [birth year]  Age: 79 y.o. MRN: [id number]  CC: Hypertension  This visit occurred during the SARS-CoV-2 public health emergency.  Safety protocols were in place, including screening questions prior to the visit, additional usage of staff PPE, and extensive cleaning of exam room while observing appropriate contact time as indicated for disinfecting solutions.    HPI Holly Simmons presents for f/up - She recently saw her nephrologist and was pleased to find out that her renal numbers are stable.  She has been taking dronabinol and has been able to improve her appetite and gain weight.  She has been told to stop taking calcium and the statin.  Outpatient Medications Prior to Visit  Medication Sig Dispense Refill   allopurinol (ZYLOPRIM) 100 MG tablet TAKE 1 TABLET BY MOUTH DAILY. 90 tablet 0   B Complex-C (B-COMPLEX WITH VITAMIN C) tablet Take 1 tablet by mouth daily.     Betamethasone Valerate 0.12 % foam APPLY TO SCALP QD (Patient taking differently: Apply 1 application topically once a week. Apply to scalp.) 100 g 2   buPROPion (WELLBUTRIN XL) 300 MG 24 hr tablet TAKE 1 TABLET ONCE DAILY. 90 tablet 0   ciclopirox (LOPROX) 0.77 % SUSP APPLY 1 PUMP TWICE A DAY. 60 mL 0   dronabinol (MARINOL) 2.5 MG capsule TAKE 1 CAPSULE TWICE DAILY BEFORE LUNCH AND SUPPER. 180 capsule 0   famotidine (PEPCID) 40 MG tablet TAKE 1 TABLET ONCE DAILY. 90 tablet 0   fexofenadine (ALLEGRA) 180 MG tablet Take 180 mg by mouth daily as needed for allergies.      ipratropium (ATROVENT) 0.03 % nasal spray USE 2 SPRAYS IN EACH NOSTRIL 3 TIMES A DAY AS NEEDED. 30 mL 0   ketoconazole (NIZORAL) 2 % cream APPLY TO AFFECTED AREA TWICE A DAY. 60 g 0   lamoTRIgine (LAMICTAL) 25 MG tablet TAKE 1 TABLET ONCE DAILY. 90 tablet 0   metoprolol tartrate (LOPRESSOR) 25 MG tablet Take 1 tablet (25 mg total) by mouth 2 (two) times daily. 180 tablet 1    modafinil (PROVIGIL) 100 MG tablet Take 1 tablet (100 mg total) by mouth daily. (Patient taking differently: Take 100 mg by mouth daily as needed. ) 30 tablet 5   PRESCRIPTION MEDICATION CPAP     SYMBICORT 80-4.5 MCG/ACT inhaler USE 2 PUFFS TWICE A DAY. 30.6 g 5   VIIBRYD 40 MG TABS TAKE 1 TABLET ONCE DAILY. 90 tablet 0   warfarin (COUMADIN) 2.5 MG tablet TAKE 1 TABLET DAILY OR AS DIRECTED BY ANTICOAGULATION CLINIC. 35 tablet 0   rosuvastatin (CRESTOR) 5 MG tablet TAKE 1 TABLET ONCE DAILY. (Patient not taking: Reported on 03/07/2020) 90 tablet 0   No facility-administered medications prior to visit.    ROS Review of Systems  Constitutional: Positive for fatigue. Negative for appetite change, diaphoresis and unexpected weight change.  HENT: Negative.   Eyes: Negative for visual disturbance.  Respiratory: Negative for cough, chest tightness, shortness of breath and wheezing.   Cardiovascular: Negative for chest pain, palpitations and leg swelling.  Gastrointestinal: Negative for abdominal pain, constipation, diarrhea, nausea and vomiting.  Endocrine: Negative.   Genitourinary: Negative.  Negative for difficulty urinating.  Musculoskeletal: Negative.  Negative for arthralgias and myalgias.  Skin: Negative.  Negative for color change.  Neurological: Negative.  Negative for dizziness and weakness.  Hematological: Negative for adenopathy. Does not bruise/bleed easily.  Psychiatric/Behavioral: Negative.     Objective:  BP Marland Kitchen)  152/50 (BP Location: Left Arm, Patient Position: Sitting, Cuff Size: Normal)    Pulse 96    Temp 98.1 F (36.7 C) (Oral)    Resp 16    Ht 5\' 4"  (1.626 m)    Wt 147 lb (66.7 kg)    SpO2 96%    BMI 25.23 kg/m   BP Readings from Last 3 Encounters:  03/07/20 (!) 152/50  03/07/20 (!) 152/50  01/09/20 (!) 144/72    Wt Readings from Last 3 Encounters:  03/07/20 147 lb (66.7 kg)  03/07/20 147 lb (66.7 kg)  01/09/20 142 lb (64.4 kg)    Physical  Exam Constitutional:      Appearance: She is ill-appearing.  HENT:     Nose: Nose normal.     Mouth/Throat:     Mouth: Mucous membranes are moist.  Eyes:     General: No scleral icterus.    Conjunctiva/sclera: Conjunctivae normal.  Cardiovascular:     Rate and Rhythm: Normal rate and regular rhythm.     Heart sounds: Murmur heard.  Systolic murmur is present with a grade of 1/6.  Diastolic murmur is present with a grade of 1/4.   Pulmonary:     Effort: Pulmonary effort is normal.     Breath sounds: No stridor. No wheezing, rhonchi or rales.  Abdominal:     General: Abdomen is flat. Bowel sounds are normal. There is no distension.     Palpations: Abdomen is soft. There is no hepatomegaly, splenomegaly or mass.  Musculoskeletal:        General: Normal range of motion.     Cervical back: Neck supple.  Lymphadenopathy:     Cervical: No cervical adenopathy.  Skin:    General: Skin is warm and dry.  Neurological:     General: No focal deficit present.     Mental Status: She is alert.  Psychiatric:        Mood and Affect: Mood normal.        Behavior: Behavior normal.     Lab Results  Component Value Date   WBC 6.3 10/18/2019   HGB 10.0 (A) 02/28/2020   HCT 37.3 10/18/2019   PLT 155 10/18/2019   GLUCOSE 87 10/18/2019   CHOL 148 02/15/2019   TRIG 80.0 02/15/2019   HDL 58.10 02/15/2019   LDLCALC 74 02/15/2019   ALT 15 10/18/2019   AST 26 10/18/2019   NA 139 02/28/2020   K 4.1 02/28/2020   CL 101 02/28/2020   CREATININE 1.6 (A) 02/28/2020   BUN 15 10/18/2019   CO2 26 (A) 02/28/2020   TSH 1.36 10/07/2019   INR 1.5 (A) 03/01/2020   HGBA1C 5.3 03/07/2020   MICROALBUR 63.9 (H) 04/20/2018    DG Cervical Spine Complete  Result Date: 01/09/2020 CLINICAL DATA:  Fall 5 days ago, pain EXAM: CERVICAL SPINE - COMPLETE 4+ VIEW COMPARISON:  None. FINDINGS: Reversal the normal cervical lordosis. No evidence of fracture or dislocation. Vertebral body heights and intervertebral  disc spaces are maintained. Dens appears intact. Lateral masses C1 are symmetric. No prevertebral soft tissue swelling. Mild degenerative changes at C4-6. Evaluation of the bilateral neural foramina is constrained by obliquity. Visualized lung apices are clear. IMPRESSION: No fracture or dislocation is seen. Mild degenerative changes of the mid cervical spine. Electronically Signed   By: Julian Hy M.D.   On: 01/09/2020 10:07   DG Thoracic Spine W/Swimmers  Result Date: 01/09/2020 CLINICAL DATA:  Fall 5 days ago, pain EXAM: THORACIC SPINE -  3 VIEWS COMPARISON:  None. FINDINGS: Normal thoracic kyphosis. No evidence of fracture or dislocation. Vertebral body heights and intervertebral disc spaces are maintained. Visualized lungs are clear. Mild cardiomegaly. Pacemaker leads. Postsurgical changes related to prior CABG. Median sternotomy. IMPRESSION: Negative. Electronically Signed   By: Julian Hy M.D.   On: 01/09/2020 10:08   CT Head Wo Contrast  Result Date: 01/09/2020 CLINICAL DATA:  Altered mental status with ataxia and several recent falls EXAM: CT HEAD WITHOUT CONTRAST TECHNIQUE: Contiguous axial images were obtained from the base of the skull through the vertex without intravenous contrast. COMPARISON:  None. FINDINGS: Brain: There is age related volume loss in the supratentorial regions. There is moderate cerebellar atrophy. There is no intracranial mass, hemorrhage, extra-axial fluid collection, or midline shift. There is patchy small vessel disease throughout the centra semiovale bilaterally. No acute appearing infarct is appreciable. Vascular: No hyperdense vessel. There is calcification in each carotid siphon region. Skull: Bony calvarium appears intact. Sinuses/Orbits: Visualized paranasal sinuses are clear. Visualized orbits appear symmetric bilaterally. Other: Mastoid air cells are clear. IMPRESSION: Moderate cerebellar atrophy. Patchy small vessel disease in the periventricular white  matter bilaterally noted. No acute infarct. No mass or hemorrhage. There are multiple foci of arterial vascular calcification. Electronically Signed   By: Lowella Grip III M.D.   On: 01/09/2020 13:45   CUP PACEART REMOTE DEVICE CHECK  Result Date: 01/10/2020 Scheduled remote reviewed.  Normal device function.  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 On Warfarin. Burden 23.3% with undersensing occurring. Undersensing is not new. Next remote 91 days.  L[place]ph[place]   Assessment & Plan:   Naveyah was seen today for hypertension.  Diagnoses and all orders for this visit:  Type 2 diabetes mellitus with complication, without long-term current use of insulin ([place])- Her blood sugar is very well controlled. -     POCT glycosylated hemoglobin (Hb A1C)  CKD (chronic kidney disease) stage 5, GFR less than 15 ml/min (HCC)- Her renal function is stable.  Her blood pressure is adequately well controlled.  Cachexia ([place])- Improvement noted.  When the current prescription for dronabinol runs out we will consider increasing the dosage.   I have discontinued Johany L. Minarik's rosuvastatin. I am also having her maintain her fexofenadine, B-complex with vitamin C, PRESCRIPTION MEDICATION, Betamethasone Valerate, modafinil, ipratropium, ketoconazole, Symbicort, metoprolol tartrate, ciclopirox, famotidine, allopurinol, Viibryd, dronabinol, buPROPion, lamoTRIgine, and warfarin.  No orders of the defined types were placed in this encounter.    Follow-up: No follow-ups on file.  Scarlette Calico, MD

## 2020-03-07 NOTE — Progress Notes (Signed)
Subjective:   Holly Simmons is a 79 y.o. female who presents for Medicare Annual (Subsequent) preventive examination.  Review of Systems   No ROS. Medicare Wellness Visit. Additional risk factors are reflected in social history. Cardiac Risk Factors include: advanced age (>12men, >68 women);diabetes mellitus;dyslipidemia;family history of premature cardiovascular disease;hypertension     Objective:    Today's Vitals   03/07/20 1424  BP: (!) 152/50  Pulse: 96  Resp: 16  Temp: 98.1 F (36.7 C)  SpO2: 96%  Weight: 147 lb (66.7 kg)  Height: 5\' 4"  (1.626 m)  PainSc: 0-No pain   Body mass index is 25.23 kg/m.  Advanced Directives 03/07/2020 10/18/2019 10/07/2019 02/19/2018 05/04/2017 02/05/2016 01/30/2016  Does Patient Have a Medical Advance Directive? Yes Yes Yes No Yes Yes No  Type of Advance Directive - - [place];Living will - Living will [place];Living will -  Does patient want to make changes to medical advance directive? No - Patient declined - - - No - Patient declined - -  Copy of [place] in Chart? - - - - - No - copy requested -  Would patient like information on creating a medical advance directive? - - - No - Patient declined - No - patient declined information No - patient declined information  Pre-existing out of facility DNR order (yellow form or pink MOST form) - - - - - - -    Current Medications (verified) Outpatient Encounter Medications as of 03/07/2020  Medication Sig  . allopurinol (ZYLOPRIM) 100 MG tablet TAKE 1 TABLET BY MOUTH DAILY.  . B Complex-C (B-COMPLEX WITH VITAMIN C) tablet Take 1 tablet by mouth daily.  . Betamethasone Valerate 0.12 % foam APPLY TO SCALP QD (Patient taking differently: Apply 1 application topically once a week. Apply to scalp.)  . buPROPion (WELLBUTRIN XL) 300 MG 24 hr tablet TAKE 1 TABLET ONCE DAILY.  . ciclopirox (LOPROX) 0.77 % SUSP APPLY 1 PUMP TWICE A DAY.  Marland Kitchen  dronabinol (MARINOL) 2.5 MG capsule TAKE 1 CAPSULE TWICE DAILY BEFORE LUNCH AND SUPPER.  . famotidine (PEPCID) 40 MG tablet TAKE 1 TABLET ONCE DAILY.  . fexofenadine (ALLEGRA) 180 MG tablet Take 180 mg by mouth daily as needed for allergies.   Marland Kitchen ipratropium (ATROVENT) 0.03 % nasal spray USE 2 SPRAYS IN EACH NOSTRIL 3 TIMES A DAY AS NEEDED.  Marland Kitchen ketoconazole (NIZORAL) 2 % cream APPLY TO AFFECTED AREA TWICE A DAY.  Marland Kitchen lamoTRIgine (LAMICTAL) 25 MG tablet TAKE 1 TABLET ONCE DAILY.  . metoprolol tartrate (LOPRESSOR) 25 MG tablet Take 1 tablet (25 mg total) by mouth 2 (two) times daily.  . modafinil (PROVIGIL) 100 MG tablet Take 1 tablet (100 mg total) by mouth daily. (Patient taking differently: Take 100 mg by mouth daily as needed. )  . PRESCRIPTION MEDICATION CPAP  . SYMBICORT 80-4.5 MCG/ACT inhaler USE 2 PUFFS TWICE A DAY.  Marland Kitchen VIIBRYD 40 MG TABS TAKE 1 TABLET ONCE DAILY.  Marland Kitchen warfarin (COUMADIN) 2.5 MG tablet TAKE 1 TABLET DAILY OR AS DIRECTED BY ANTICOAGULATION CLINIC.  . [DISCONTINUED] Alum & Mag Hydroxide-Simeth (MAGIC MOUTHWASH) SOLN Take 5 mLs by mouth 3 (three) times daily. 14 days then stop    No facility-administered encounter medications on file as of 03/07/2020.    Allergies (verified) Petrolatum-zinc oxide; Sulfa antibiotics; Sulfonamide derivatives; Tetanus toxoid; Tetanus toxoids; Other; Nsaids; and Tetanus toxoid, adsorbed   History: Past Medical History:  Diagnosis Date  . Allergic rhinitis   .  Asthma    NOS w/ acute exacerbation  . Blood transfusion without reported diagnosis   . CKD (chronic kidney disease) stage 3, GFR 30-59 ml/min 09/12/2018  . Colon polyps    TUBULAR ADENOMAS AND HYPERPLASTIC  . Complete heart block (HCC)    requiring PPM (MDT) post surgical myomectomy at [facility],  leads are epicardial with abdominal implant, high ventricular threshold at implant  . COPD (chronic obstructive pulmonary disease) ([place])   . Depressive disorder   . Diastolic dysfunction   . DM  (diabetes mellitus) ([place])   . Gallstones   . GERD (gastroesophageal reflux disease)   . Gout 08/20/2012  . Heart murmur   . Hyperpotassemia   . Hypersomnia   . Hypertension   . Hypertrophic cardiomyopathy ([place])    s/p surgical myomectomy at [facility] 30/07 complicated by septal VSD post procedure requiring reoperation with patch closure and tricuspid valve replacement  . Kidney stones   . Myocardial infarction ([place]) 2011  . Obstructive sleep apnea    persistent daytime sleepiness despite cpap  . 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 Psoriasis   . Pyuria   . Renal insufficiency   . 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 Typical atrial flutter ([place]) 9/15   Past Surgical History:  Procedure Laterality Date  . ABDOMINAL HYSTERECTOMY    . APPENDECTOMY    . BREAST CYST EXCISION    . CARDIOVERSION N/A 08/01/2014   Procedure: CARDIOVERSION;  Surgeon: Thayer Headings, MD;  Location: [place];  Service: Cardiovascular;  Laterality: N/A;  . CESAREAN SECTION    . CHOLECYSTECTOMY    . COLONOSCOPY    . EYE SURGERY  [id number]   left eye lens implant  . EYE SURGERY  [id number]   right eye lens implant  . INSERT / REPLACE / REMOVE PACEMAKER  07/13/2012  . PACEMAKER INSERTION  07/02/11   epicardial wires with abdominal implant at [facility] 12/12,  high ventricular lead threshold at implant per Dr Westley Gambles  . PERMANENT PACEMAKER INSERTION N/A 07/13/2012   Procedure: PERMANENT PACEMAKER INSERTION;  Surgeon: Thompson Grayer, MD;  Location: [facility] CATH LAB;  Service: Cardiovascular;  Laterality: N/A;  . POLYPECTOMY    . septal myomectomy for hypertrophic CM  06/23/11   by Dr Evelina Dun at [facility], complicated by septal VSD requiring patch repair and tricuspid valve replacement  . TONSILLECTOMY    . TRICUSPID VALVE REPLACEMENT  11/12   Medtronic 60mm Mosaic tissue valve  . VSD REPAIR  06/2011   Family History  Problem Relation Age of Onset  . Hypertension Daughter   . Heart disease Maternal Grandfather   .  Heart disease Paternal Grandfather   . Bladder Cancer Father   . Prostate cancer Father   . Cancer Father   . Varicose Veins Father   . Heart attack Father   . Breast cancer Mother   . Dementia Mother   . Hypertension Mother   . Cancer Mother   . Ovarian cancer Maternal Aunt   . Pancreatic cancer Cousin   . Breast cancer Paternal Aunt   . Dementia Maternal Aunt        x 2  . Dementia Maternal Uncle        x 2  . Colon cancer Neg Hx    Social History   Socioeconomic History  . Marital status: Married    Spouse name: [name]  . Number of children: 3  . Years of education: Not on file  .  Highest education level: Not on file  Occupational History  . Occupation: retired    Fish farm manager: RETIRED  Tobacco Use  . Smoking status: Never Smoker  . Smokeless tobacco: Never Used  Vaping Use  . Vaping Use: Never used  Substance and Sexual Activity  . Alcohol use: No    Alcohol/week: 0.0 standard drinks  . Drug use: No  . Sexual activity: Not on file  Other Topics Concern  . Not on file  Social History Narrative  . Not on file   Social Determinants of Health   Financial Resource Strain: Low Risk   . Difficulty of Paying Living Expenses: Not hard at all  Food Insecurity: No Food Insecurity  . Worried About Charity fundraiser in the Last Year: Never true  . Ran Out of Food in the Last Year: Never true  Transportation Needs: No Transportation Needs  . Lack of Transportation (Medical): No  . Lack of Transportation (Non-Medical): No  Physical Activity: Inactive  . Days of Exercise per Week: 0 days  . Minutes of Exercise per Session: 0 min  Stress: No Stress Concern Present  . Feeling of Stress : Not at all  Social Connections:   . Frequency of Communication with Friends and Family:   . Frequency of Social Gatherings with Friends and Family:   . Attends Religious Services:   . Active Member of Clubs or Organizations:   . Attends Archivist Meetings:   Marland Kitchen Marital  Status:     Tobacco Counseling Counseling given: No   Clinical Intake:  Pre-visit preparation completed: Yes  Pain : No/denies pain Pain Score: 0-No pain     BMI - recorded: 25.23 Nutritional Status: BMI 25 -29 Overweight Nutritional Risks: None Diabetes: Yes CBG done?: No Did pt. bring in CBG monitor from home?: No  How often do you need to have someone help you when you read instructions, pamphlets, or other written materials from your doctor or pharmacy?: 1 - Never What is the last grade level you completed in school?: College Degree  Diabetic? yes  Interpreter Needed?: No  Information entered by :: Brodie Scovell N. Donaciano Range, LPN   Activities of Daily Living In your present state of health, do you have any difficulty performing the following activities: 03/07/2020  Hearing? N  Vision? N  Difficulty concentrating or making decisions? Y  Walking or climbing stairs? Y  Dressing or bathing? N  Doing errands, shopping? Y  Preparing Food and eating ? N  Using the Toilet? N  In the past six months, have you accidently leaked urine? N  Do you have problems with loss of bowel control? N  Managing your Medications? N  Managing your Finances? N  Housekeeping or managing your Housekeeping? N  Some recent data might be hidden    Patient Care Team: Janith Lima, MD as PCP - General Josue Hector, MD as PCP - Cardiology (Cardiology)  Indicate any recent Medical Services you may have received from other than [facility] providers in the past year (date may be approximate).     Assessment:   This is a routine wellness examination for Holly Simmons.  Hearing/Vision screen No exam data present  Dietary issues and exercise activities discussed: Current Exercise Habits: The patient does not participate in regular exercise at present, Exercise limited by: psychological condition(s);cardiac condition(s)  Goals    . Record weight daily     Call back on snacks; 3 or 4 times (cookies,  ice cream, yogurt)  Snacks; HFCS (high fructose corn syrup)  Low sodium; warm lemon water in morning;       Depression Screen PHQ 2/9 Scores 03/07/2020 02/15/2019 07/20/2018 03/18/2017 01/30/2016 05/31/2015 12/27/2014  PHQ - 2 Score 0 1 4 0 2 2 4   PHQ- 9 Score - 3 9 - 2 - -  Exception Documentation Medical reason - - - - - -    Fall Risk Fall Risk  03/07/2020 01/09/2020 02/15/2019 07/20/2018 03/18/2017  Falls in the past year? 1 0 1 0 No  Number falls in past yr: 1 1 1  0 -  Injury with Fall? 0 1 0 0 -  Risk Factor Category  - - - - -  Risk for fall due to : Impaired balance/gait;Impaired mobility;Mental status change Impaired balance/gait;Impaired mobility;Mental status change Impaired balance/gait;Impaired mobility - -  Follow up Falls evaluation completed Falls evaluation completed Falls evaluation completed;Education provided Falls evaluation completed -    Any stairs in or around the home? Yes  If so, are there any without handrails? No  Home free of loose throw rugs in walkways, pet beds, electrical cords, etc? Yes  Adequate lighting in your home to reduce risk of falls? Yes   ASSISTIVE DEVICES UTILIZED TO PREVENT FALLS:  Life alert? No  Use of a cane, walker or w/c? No  Grab bars in the bathroom? No  Shower chair or bench in shower? Yes  Elevated toilet seat or a handicapped toilet? No   TIMED UP AND GO:  Was the test performed? No .  Length of time to ambulate 10 feet: 0 sec.   Gait steady and fast without use of assistive device  Cognitive Function: MMSE - Mini Mental State Exam 12/27/2014  Not completed: (No Data)     6CIT Screen 03/07/2020  What Year? 0 points  What month? 0 points  What time? 3 points  Count back from 20 0 points  Months in reverse 2 points  Repeat phrase 10 points  Total Score 15    Immunizations Immunization History  Administered Date(s) Administered  . Fluad Quad(high Dose 65+) 04/04/2019  . Influenza Split 05/14/2011, 05/12/2012  .  Influenza Whole 05/02/2009, 04/24/2010  . Influenza, High Dose Seasonal PF 04/15/2016, 05/06/2017, 04/20/2018  . Influenza,inj,Quad PF,6+ Mos 04/13/2013, 04/19/2014, 05/02/2015  . Moderna SARS-COVID-2 Vaccination 08/17/2019, 09/14/2019  . Pneumococcal Conjugate-13 01/18/2014  . Pneumococcal Polysaccharide-23 08/10/2008, 05/30/2015  . Varicella 03/11/2006  . Zoster 08/04/2014  . Zoster Recombinat (Shingrix) 12/29/2017, 04/02/2018    TDAP status: Due, Education has been provided regarding the importance of this vaccine. Advised may receive this vaccine at local pharmacy or Health Dept. Aware to provide a copy of the vaccination record if obtained from local pharmacy or Health Dept. Verbalized acceptance and understanding. Patient os allergic to vaccine) Flu Vaccine status: Up to date Pneumococcal vaccine status: Up to date Covid-19 vaccine status: Completed vaccines  Qualifies for Shingles Vaccine? Yes   Zostavax completed Yes   Shingrix Completed?: Yes  Screening Tests Health Maintenance  Topic Date Due  . Hepatitis C Screening  Never done  . URINE MICROALBUMIN  04/21/2019  . COLONOSCOPY  05/08/2019  . FOOT EXAM  11/23/2019  . INFLUENZA VACCINE  03/04/2020  . HEMOGLOBIN A1C  09/07/2020  . OPHTHALMOLOGY EXAM  09/18/2020  . DEXA SCAN  Completed  . COVID-19 Vaccine  Completed  . PNA vac Low Risk Adult  Completed    Health Maintenance  Health Maintenance Due  Topic Date Due  . Hepatitis  C Screening  Never done  . URINE MICROALBUMIN  04/21/2019  . COLONOSCOPY  05/08/2019  . FOOT EXAM  11/23/2019  . INFLUENZA VACCINE  03/04/2020    Colorectal cancer screening: Completed 05/07/2014. Repeat every 5 years Mammogram status: Completed 04/25/2019. Repeat every year Bone Density status: Completed 06/20/2015. Results reflect: Bone density results: NORMAL. Repeat every 2-3 years.  Lung Cancer Screening: (Low Dose CT Chest recommended if Age 7-80 years, 30 pack-year currently smoking  OR have quit w/in 15years.) does not qualify.   Lung Cancer Screening Referral: no  Additional Screening:  Hepatitis C Screening: does qualify; Completed no  Vision Screening: Recommended annual ophthalmology exams for early detection of glaucoma and other disorders of the eye. Is the patient up to date with their annual eye exam?  Yes  Who is the provider or what is the name of the office in which the patient attends annual eye exams? [facility] If pt is not established with a provider, would they like to be referred to a provider to establish care? No .   Dental Screening: Recommended annual dental exams for proper oral hygiene  Community Resource Referral / Chronic Care Management: CRR required this visit?  No   CCM required this visit?  No      Plan:     I have personally reviewed and noted the following in the patient's chart:   . Medical and social history . Use of alcohol, tobacco or illicit drugs  . Current medications and supplements . Functional ability and status . Nutritional status . Physical activity . Advanced directives . List of other physicians . Hospitalizations, surgeries, and ER visits in previous 12 months . Vitals . Screenings to include cognitive, depression, and falls . Referrals and appointments  In addition, I have reviewed and discussed with patient certain preventive protocols, quality metrics, and best practice recommendations. A written personalized care plan for preventive services as well as general preventive health recommendations were provided to patient.     Sheral Flow, LPN   [id number]/[id number]   Nurse Notes: n/a

## 2020-03-07 NOTE — Patient Instructions (Addendum)
Holly Simmons , Thank you for taking time to come for your Medicare Wellness Visit. I appreciate your ongoing commitment to your health goals. Please review the following plan we discussed and let me know if I can assist you in the future.   Screening recommendations/referrals: Colonoscopy: 05/07/2014; due every 5 years Mammogram: 04/25/2019 Bone Density: 06/20/2015 Recommended yearly ophthalmology/optometry visit for glaucoma screening and checkup Recommended yearly dental visit for hygiene and checkup  Vaccinations: Influenza vaccine: 04/04/2019 Pneumococcal vaccine: completed Tdap vaccine: allergic Shingles vaccine: completed   Covid-19: completed  Advanced directives: Yes; documents are on file.  Conditions/risks identified: Please continue to do your personal lifestyle choices by: daily care of teeth and gums, regular physical activity (goal should be 5 days a week for 30 minutes), eat a healthy diet, avoid tobacco and drug use, limiting any alcohol intake, taking a low-dose aspirin (if not allergic or have been advised by your provider otherwise) and taking vitamins and minerals as recommended by your provider. Continue doing brain stimulating activities (puzzles, reading, adult coloring books, staying active) to keep memory sharp. Continue to eat heart healthy diet (full of fruits, vegetables, whole grains, lean protein, water--limit salt, fat, and sugar intake) and increase physical activity as tolerated.  Next appointment: Please schedule your next Medicare Wellness Visit with your Nurse Health Advisor in 1 year.   Preventive Care 77 Years and Older, Female Preventive care refers to lifestyle choices and visits with your health care provider that can promote health and wellness. What does preventive care include?  A yearly physical exam. This is also called an annual well check.  Dental exams once or twice a year.  Routine eye exams. Ask your health care provider how often you  should have your eyes checked.  Personal lifestyle choices, including:  Daily care of your teeth and gums.  Regular physical activity.  Eating a healthy diet.  Avoiding tobacco and drug use.  Limiting alcohol use.  Practicing safe sex.  Taking low-dose aspirin every day.  Taking vitamin and mineral supplements as recommended by your health care provider. What happens during an annual well check? The services and screenings done by your health care provider during your annual well check will depend on your age, overall health, lifestyle risk factors, and family history of disease. Counseling  Your health care provider may ask you questions about your:  Alcohol use.  Tobacco use.  Drug use.  Emotional well-being.  Home and relationship well-being.  Sexual activity.  Eating habits.  History of falls.  Memory and ability to understand (cognition).  Work and work Statistician.  Reproductive health. Screening  You may have the following tests or measurements:  Height, weight, and BMI.  Blood pressure.  Lipid and cholesterol levels. These may be checked every 5 years, or more frequently if you are over 75 years old.  Skin check.  Lung cancer screening. You may have this screening every year starting at age 61 if you have a 30-pack-year history of smoking and currently smoke or have quit within the past 15 years.  Fecal occult blood test (FOBT) of the stool. You may have this test every year starting at age 46.  Flexible sigmoidoscopy or colonoscopy. You may have a sigmoidoscopy every 5 years or a colonoscopy every 10 years starting at age 24.  Hepatitis C blood test.  Hepatitis B blood test.  Sexually transmitted disease (STD) testing.  Diabetes screening. This is done by checking your blood sugar (glucose) after you have not  eaten for a while (fasting). You may have this done every 1-3 years.  Bone density scan. This is done to screen for osteoporosis.  You may have this done starting at age 31.  Mammogram. This may be done every 1-2 years. Talk to your health care provider about how often you should have regular mammograms. Talk with your health care provider about your test results, treatment options, and if necessary, the need for more tests. Vaccines  Your health care provider may recommend certain vaccines, such as:  Influenza vaccine. This is recommended every year.  Tetanus, diphtheria, and acellular pertussis (Tdap, Td) vaccine. You may need a Td booster every 10 years.  Zoster vaccine. You may need this after age 70.  Pneumococcal 13-valent conjugate (PCV13) vaccine. One dose is recommended after age 47.  Pneumococcal polysaccharide (PPSV23) vaccine. One dose is recommended after age 54. Talk to your health care provider about which screenings and vaccines you need and how often you need them. This information is not intended to replace advice given to you by your health care provider. Make sure you discuss any questions you have with your health care provider. Document Released: 08/17/2015 Document Revised: 04/09/2016 Document Reviewed: 05/22/2015 Elsevier Interactive Patient Education  2017 [employer] Prevention in the Home Falls can cause injuries. They can happen to people of all ages. There are many things you can do to make your home safe and to help prevent falls. What can I do on the outside of my home?  Regularly fix the edges of walkways and driveways and fix any cracks.  Remove anything that might make you trip as you walk through a door, such as a raised step or threshold.  Trim any bushes or trees on the path to your home.  Use bright outdoor lighting.  Clear any walking paths of anything that might make someone trip, such as rocks or tools.  Regularly check to see if handrails are loose or broken. Make sure that both sides of any steps have handrails.  Any raised decks and porches should have  guardrails on the edges.  Have any leaves, snow, or ice cleared regularly.  Use sand or salt on walking paths during winter.  Clean up any spills in your garage right away. This includes oil or grease spills. What can I do in the bathroom?  Use night lights.  Install grab bars by the toilet and in the tub and shower. Do not use towel bars as grab bars.  Use non-skid mats or decals in the tub or shower.  If you need to sit down in the shower, use a plastic, non-slip stool.  Keep the floor dry. Clean up any water that spills on the floor as soon as it happens.  Remove soap buildup in the tub or shower regularly.  Attach bath mats securely with double-sided non-slip rug tape.  Do not have throw rugs and other things on the floor that can make you trip. What can I do in the bedroom?  Use night lights.  Make sure that you have a light by your bed that is easy to reach.  Do not use any sheets or blankets that are too big for your bed. They should not hang down onto the floor.  Have a firm chair that has side arms. You can use this for support while you get dressed.  Do not have throw rugs and other things on the floor that can make you trip. What can I  do in the kitchen?  Clean up any spills right away.  Avoid walking on wet floors.  Keep items that you use a lot in easy-to-reach places.  If you need to reach something above you, use a strong step stool that has a grab bar.  Keep electrical cords out of the way.  Do not use floor polish or wax that makes floors slippery. If you must use wax, use non-skid floor wax.  Do not have throw rugs and other things on the floor that can make you trip. What can I do with my stairs?  Do not leave any items on the stairs.  Make sure that there are handrails on both sides of the stairs and use them. Fix handrails that are broken or loose. Make sure that handrails are as long as the stairways.  Check any carpeting to make sure that  it is firmly attached to the stairs. Fix any carpet that is loose or worn.  Avoid having throw rugs at the top or bottom of the stairs. If you do have throw rugs, attach them to the floor with carpet tape.  Make sure that you have a light switch at the top of the stairs and the bottom of the stairs. If you do not have them, ask someone to add them for you. What else can I do to help prevent falls?  Wear shoes that:  Do not have high heels.  Have rubber bottoms.  Are comfortable and fit you well.  Are closed at the toe. Do not wear sandals.  If you use a stepladder:  Make sure that it is fully opened. Do not climb a closed stepladder.  Make sure that both sides of the stepladder are locked into place.  Ask someone to hold it for you, if possible.  Clearly mark and make sure that you can see:  Any grab bars or handrails.  First and last steps.  Where the edge of each step is.  Use tools that help you move around (mobility aids) if they are needed. These include:  Canes.  Walkers.  Scooters.  Crutches.  Turn on the lights when you go into a dark area. Replace any light bulbs as soon as they burn out.  Set up your furniture so you have a clear path. Avoid moving your furniture around.  If any of your floors are uneven, fix them.  If there are any pets around you, be aware of where they are.  Review your medicines with your doctor. Some medicines can make you feel dizzy. This can increase your chance of falling. Ask your doctor what other things that you can do to help prevent falls. This information is not intended to replace advice given to you by your health care provider. Make sure you discuss any questions you have with your health care provider. Document Released: 05/17/2009 Document Revised: 12/27/2015 Document Reviewed: 08/25/2014 Elsevier Interactive Patient Education  2017 [employer].

## 2020-03-11 ENCOUNTER — Encounter: Payer: Self-pay | Admitting: Internal Medicine

## 2020-03-11 NOTE — Patient Instructions (Signed)
Chronic Kidney Disease, Adult Chronic kidney disease (CKD) occurs when the kidneys become damaged slowly over a long period of time. The kidneys are a pair of organs that do many important jobs in the body, including:  Removing waste and extra fluid from the blood to make urine.  Making hormones that maintain the amount of fluid in tissues and blood vessels.  Maintaining the right amount of fluids and chemicals in the body. A small amount of kidney damage may not cause problems, but a large amount of damage may make it hard or impossible for the kidneys to work the way they should. If steps are not taken to slow down kidney damage or to stop it from getting worse, the kidneys may stop working permanently (end-stage renal disease or ESRD). Most of the time, CKD does not go away, but it can often be controlled. People who have CKD are usually able to live normal lives. What are the causes? The most common causes of this condition are diabetes and high blood pressure (hypertension). Other causes include:  Heart and blood vessel (cardiovascular) disease.  Kidney diseases, such as: ? Glomerulonephritis. ? Interstitial nephritis. ? Polycystic kidney disease. ? Renal vascular disease.  Diseases that affect the immune system.  Genetic diseases.  Medicines that damage the kidneys, such as anti-inflammatory medicines.  Being around or being in contact with poisonous (toxic) substances.  A kidney or urinary infection that occurs again and again (recurs).  Vasculitis. This is swelling or inflammation of the blood vessels.  A problem with urine flow that may be caused by: ? Cancer. ? Having kidney stones more than one time. ? An enlarged prostate, in males. What increases the risk? You are more likely to develop this condition if you:  Are older than age 60.  Are female.  Are African-American, Hispanic, Asian, Pacific Islander, or American Indian.  Are a current or former smoker.   Are obese.  Have a family history of kidney disease or failure.  Often take medicines that are damaging to the kidneys. What are the signs or symptoms? Symptoms of this condition include:  Swelling (edema) of the face, legs, ankles, or feet.  Tiredness (lethargy) and having less energy.  Nausea or vomiting.  Confusion or trouble concentrating.  Problems with urination, such as: ? Painful or burning feeling during urination. ? Decreased urine production. ? Frequent urination, especially at night. ? Bloody urine.  Muscle twitches and cramps, especially in the legs.  Shortness of breath.  Weakness.  Loss of appetite.  Metallic taste in the mouth.  Trouble sleeping.  Dry, itchy skin.  A low blood count (anemia).  Pale lining of the eyelids and surface of the eye (conjunctiva). Symptoms develop slowly and may not be obvious until the kidney damage becomes severe. It is possible to have kidney disease for years without having any symptoms. How is this diagnosed? This condition may be diagnosed based on:  Blood tests.  Urine tests.  Imaging tests, such as an ultrasound or CT scan.  A test in which a sample of tissue is removed from the kidneys to be examined under a microscope (kidney biopsy). These test results will help your health care provider determine how serious the CKD is. How is this treated? There is no cure for most cases of this condition, but treatment usually relieves symptoms and prevents or slows the progression of the disease. Treatment may include:  Making diet changes, which may require you to avoid alcohol, salty foods (sodium),   and foods that are high in potassium, calcium, and protein.  Medicines: ? To lower blood pressure. ? To control blood glucose. ? To relieve anemia. ? To relieve swelling. ? To protect your bones. ? To improve the balance of electrolytes in your blood.  Removing toxic waste from the body through types of dialysis, if  the kidneys can no longer do their job (kidney failure).  Managing any other conditions that are causing your CKD or making it worse. Follow these instructions at home: Medicines  Take over-the-counter and prescription medicines only as told by your health care provider. The dose of some medicines that you take may need to be adjusted.  Do not take any new medicines unless approved by your health care provider. Many medicines can worsen your kidney damage.  Do not take any vitamin and mineral supplements unless approved by your health care provider. Many nutritional supplements can worsen your kidney damage. General instructions  Follow your prescribed diet as told by your health care provider.  Do not use any products that contain nicotine or tobacco, such as cigarettes and e-cigarettes. If you need help quitting, ask your health care provider.  Monitor and track your blood pressure at home. Report changes in your blood pressure as told by your health care provider.  If you are being treated for diabetes, monitor and track your blood sugar (blood glucose) levels as told by your health care provider.  Maintain a healthy weight. If you need help with this, ask your health care provider.  Start or continue an exercise plan. Exercise at least 30 minutes a day, 5 days a week.  Keep your immunizations up to date as told by your health care provider.  Keep all follow-up visits as told by your health care provider. This is important. Where to find more information  American Association of Kidney Patients: www.aakp.org  National Kidney Foundation: www.kidney.org  American Kidney Fund: www.akfinc.org  Life Options Rehabilitation Program: www.lifeoptions.org and www.kidneyschool.org Contact a health care provider if:  Your symptoms get worse.  You develop new symptoms. Get help right away if:  You develop symptoms of ESRD, which include: ? Headaches. ? Numbness in the hands or  feet. ? Easy bruising. ? Frequent hiccups. ? Chest pain. ? Shortness of breath. ? Lack of menstruation, in women.  You have a fever.  You have decreased urine production.  You have pain or bleeding when you urinate. Summary  Chronic kidney disease (CKD) occurs when the kidneys become damaged slowly over a long period of time.  The most common causes of this condition are diabetes and high blood pressure (hypertension).  There is no cure for most cases of this condition, but treatment usually relieves symptoms and prevents or slows the progression of the disease. Treatment may include a combination of medicines and lifestyle changes. This information is not intended to replace advice given to you by your health care provider. Make sure you discuss any questions you have with your health care provider. Document Revised: 07/03/2017 Document Reviewed: 08/28/2016 Elsevier Patient Education  2020 [employer].  

## 2020-03-12 ENCOUNTER — Other Ambulatory Visit: Payer: Self-pay | Admitting: Internal Medicine

## 2020-03-12 DIAGNOSIS — R64 Cachexia: Secondary | ICD-10-CM

## 2020-03-13 ENCOUNTER — Other Ambulatory Visit: Payer: Self-pay | Admitting: Internal Medicine

## 2020-03-13 DIAGNOSIS — R64 Cachexia: Secondary | ICD-10-CM

## 2020-03-13 MED ORDER — DRONABINOL 5 MG PO CAPS: 5.0000 mg | ORAL_CAPSULE | Freq: Two times a day (BID) | ORAL | 0 refills | Status: DC

## 2020-03-14 ENCOUNTER — Other Ambulatory Visit: Payer: Self-pay | Admitting: Internal Medicine

## 2020-03-14 DIAGNOSIS — K219 Gastro-esophageal reflux disease without esophagitis: Secondary | ICD-10-CM

## 2020-03-14 DIAGNOSIS — Z7901 Long term (current) use of anticoagulants: Secondary | ICD-10-CM

## 2020-03-15 ENCOUNTER — Ambulatory Visit: Payer: Medicare PPO | Admitting: Internal Medicine

## 2020-03-15 ENCOUNTER — Other Ambulatory Visit: Payer: Self-pay

## 2020-03-15 DIAGNOSIS — I4892 Unspecified atrial flutter: Secondary | ICD-10-CM | POA: Diagnosis not present

## 2020-03-15 DIAGNOSIS — Z7901 Long term (current) use of anticoagulants: Secondary | ICD-10-CM | POA: Diagnosis not present

## 2020-03-15 LAB — POCT INR: INR: 2 (ref 2.0–3.0)

## 2020-03-15 NOTE — Patient Instructions (Signed)
Pre visit review using our clinic review tool, if applicable. No additional management support is needed unless otherwise documented below in the visit note.  Take 1 tablet daily except 2 tablets on Mondays Wednesdays and Fridays. Re-check in 4 weeks.

## 2020-03-15 NOTE — Progress Notes (Signed)
Subjective:  Patient ID: Holly Simmons, female    DOB: 10-13-1940  Age: 79 y.o. MRN: [id number]  CC: No chief complaint on file.   HPI Holly Simmons presents for not seen  Outpatient Medications Prior to Visit  Medication Sig Dispense Refill  . allopurinol (ZYLOPRIM) 100 MG tablet TAKE 1 TABLET BY MOUTH DAILY. 90 tablet 1  . B Complex-C (B-COMPLEX WITH VITAMIN C) tablet Take 1 tablet by mouth daily.    . Betamethasone Valerate 0.12 % foam APPLY TO SCALP QD (Patient taking differently: Apply 1 application topically once a week. Apply to scalp.) 100 g 2  . buPROPion (WELLBUTRIN XL) 300 MG 24 hr tablet TAKE 1 TABLET ONCE DAILY. 90 tablet 0  . ciclopirox (LOPROX) 0.77 % SUSP APPLY 1 PUMP TWICE A DAY. 60 mL 0  . dronabinol (MARINOL) 5 MG capsule Take 1 capsule (5 mg total) by mouth 2 (two) times daily before lunch and supper. 180 capsule 0  . famotidine (PEPCID) 40 MG tablet TAKE 1 TABLET ONCE DAILY. 90 tablet 1  . fexofenadine (ALLEGRA) 180 MG tablet Take 180 mg by mouth daily as needed for allergies.     Marland Kitchen ipratropium (ATROVENT) 0.03 % nasal spray USE 2 SPRAYS IN EACH NOSTRIL 3 TIMES A DAY AS NEEDED. 30 mL 0  . ketoconazole (NIZORAL) 2 % cream APPLY TO AFFECTED AREA TWICE A DAY. 60 g 0  . lamoTRIgine (LAMICTAL) 25 MG tablet TAKE 1 TABLET ONCE DAILY. 90 tablet 0  . metoprolol tartrate (LOPRESSOR) 25 MG tablet Take 1 tablet (25 mg total) by mouth 2 (two) times daily. 180 tablet 1  . modafinil (PROVIGIL) 100 MG tablet Take 1 tablet (100 mg total) by mouth daily. (Patient taking differently: Take 100 mg by mouth daily as needed. ) 30 tablet 5  . PRESCRIPTION MEDICATION CPAP    . SYMBICORT 80-4.5 MCG/ACT inhaler USE 2 PUFFS TWICE A DAY. 30.6 g 5  . VIIBRYD 40 MG TABS TAKE 1 TABLET ONCE DAILY. 90 tablet 1  . warfarin (COUMADIN) 2.5 MG tablet TAKE 1 TABLET DAILY OR AS DIRECTED BY ANTICOAGULATION CLINIC. 35 tablet 0   No facility-administered medications prior to visit.     ROS Review of Systems  Objective:  There were no vitals taken for this visit.  BP Readings from Last 3 Encounters:  03/07/20 (!) 152/50  03/07/20 (!) 152/50  01/09/20 (!) 144/72    Wt Readings from Last 3 Encounters:  03/07/20 147 lb (66.7 kg)  03/07/20 147 lb (66.7 kg)  01/09/20 142 lb (64.4 kg)    Physical Exam  Lab Results  Component Value Date   WBC 6.3 10/18/2019   HGB 10.0 (A) 02/28/2020   HCT 37.3 10/18/2019   PLT 155 10/18/2019   GLUCOSE 87 10/18/2019   CHOL 148 02/15/2019   TRIG 80.0 02/15/2019   HDL 58.10 02/15/2019   LDLCALC 74 02/15/2019   ALT 15 10/18/2019   AST 26 10/18/2019   NA 139 02/28/2020   K 4.1 02/28/2020   CL 101 02/28/2020   CREATININE 1.6 (A) 02/28/2020   BUN 15 10/18/2019   CO2 26 (A) 02/28/2020   TSH 1.36 10/07/2019   INR 2.0 03/15/2020   HGBA1C 5.3 03/07/2020   MICROALBUR 63.9 (H) 04/20/2018    DG Cervical Spine Complete  Result Date: 01/09/2020 CLINICAL DATA:  Fall 5 days ago, pain EXAM: CERVICAL SPINE - COMPLETE 4+ VIEW COMPARISON:  None. FINDINGS: Reversal the normal cervical lordosis. No evidence of  fracture or dislocation. Vertebral body heights and intervertebral disc spaces are maintained. Dens appears intact. Lateral masses C1 are symmetric. No prevertebral soft tissue swelling. Mild degenerative changes at C4-6. Evaluation of the bilateral neural foramina is constrained by obliquity. Visualized lung apices are clear. IMPRESSION: No fracture or dislocation is seen. Mild degenerative changes of the mid cervical spine. Electronically Signed   By: Julian Hy M.D.   On: 01/09/2020 10:07   DG Thoracic Spine W/Swimmers  Result Date: 01/09/2020 CLINICAL DATA:  Fall 5 days ago, pain EXAM: THORACIC SPINE - 3 VIEWS COMPARISON:  None. FINDINGS: Normal thoracic kyphosis. No evidence of fracture or dislocation. Vertebral body heights and intervertebral disc spaces are maintained. Visualized lungs are clear. Mild cardiomegaly.  Pacemaker leads. Postsurgical changes related to prior CABG. Median sternotomy. IMPRESSION: Negative. Electronically Signed   By: Julian Hy M.D.   On: 01/09/2020 10:08   CT Head Wo Contrast  Result Date: 01/09/2020 CLINICAL DATA:  Altered mental status with ataxia and several recent falls EXAM: CT HEAD WITHOUT CONTRAST TECHNIQUE: Contiguous axial images were obtained from the base of the skull through the vertex without intravenous contrast. COMPARISON:  None. FINDINGS: Brain: There is age related volume loss in the supratentorial regions. There is moderate cerebellar atrophy. There is no intracranial mass, hemorrhage, extra-axial fluid collection, or midline shift. There is patchy small vessel disease throughout the centra semiovale bilaterally. No acute appearing infarct is appreciable. Vascular: No hyperdense vessel. There is calcification in each carotid siphon region. Skull: Bony calvarium appears intact. Sinuses/Orbits: Visualized paranasal sinuses are clear. Visualized orbits appear symmetric bilaterally. Other: Mastoid air cells are clear. IMPRESSION: Moderate cerebellar atrophy. Patchy small vessel disease in the periventricular white matter bilaterally noted. No acute infarct. No mass or hemorrhage. There are multiple foci of arterial vascular calcification. Electronically Signed   By: Lowella Grip III M.D.   On: 01/09/2020 13:45   CUP PACEART REMOTE DEVICE CHECK  Result Date: 01/10/2020 Scheduled remote reviewed.  Normal device function.  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 On Warfarin. Burden 23.3% with undersensing occurring. Undersensing is not new. Next remote 91 days.  LHumphrey   Assessment & Plan:   Diagnoses and all orders for this visit:  Atrial flutter, unspecified type ([place])  Long term (current) use of anticoagulants  Other orders -     POCT INR   I am having Holly Simmons maintain her fexofenadine,  B-complex with vitamin C, PRESCRIPTION MEDICATION, Betamethasone Valerate, modafinil, ipratropium, ketoconazole, Symbicort, metoprolol tartrate, ciclopirox, buPROPion, lamoTRIgine, dronabinol, famotidine, warfarin, allopurinol, and Viibryd.  No orders of the defined types were placed in this encounter.    Follow-up: No follow-ups on file.  Scarlette Calico, MDI have reviewed and agree

## 2020-03-26 ENCOUNTER — Other Ambulatory Visit: Payer: Self-pay | Admitting: Internal Medicine

## 2020-03-26 DIAGNOSIS — Z1231 Encounter for screening mammogram for malignant neoplasm of breast: Secondary | ICD-10-CM

## 2020-04-10 ENCOUNTER — Ambulatory Visit (INDEPENDENT_AMBULATORY_CARE_PROVIDER_SITE_OTHER): Payer: Medicare PPO | Admitting: *Deleted

## 2020-04-10 DIAGNOSIS — I442 Atrioventricular block, complete: Secondary | ICD-10-CM | POA: Diagnosis not present

## 2020-04-11 LAB — CUP PACEART REMOTE DEVICE CHECK
Battery Impedance: 970 Ohm
Battery Remaining Longevity: 57 mo
Battery Voltage: 2.77 V
Brady Statistic AP VP Percent: 48 %
Brady Statistic AP VS Percent: 0 %
Brady Statistic AS VP Percent: 52 %
Brady Statistic AS VS Percent: 0 %
Date Time Interrogation Session: 20210907101438
Implantable Lead Implant Date: 20131210
Implantable Lead Implant Date: 20131210
Implantable Lead Location: 753858
Implantable Lead Location: 753859
Implantable Lead Model: 4296
Implantable Lead Model: 5076
Implantable Pulse Generator Implant Date: 20131210
Lead Channel Impedance Value: 441 Ohm
Lead Channel Impedance Value: 774 Ohm
Lead Channel Pacing Threshold Amplitude: 0.875 V
Lead Channel Pacing Threshold Amplitude: 1.375 V
Lead Channel Pacing Threshold Pulse Width: 0.4 ms
Lead Channel Pacing Threshold Pulse Width: 0.4 ms
Lead Channel Setting Pacing Amplitude: 2 V
Lead Channel Setting Pacing Amplitude: 2.75 V
Lead Channel Setting Pacing Pulse Width: 0.4 ms
Lead Channel Setting Sensing Sensitivity: 8 mV

## 2020-04-12 ENCOUNTER — Other Ambulatory Visit: Payer: Self-pay | Admitting: General Practice

## 2020-04-12 ENCOUNTER — Ambulatory Visit: Payer: Medicare PPO | Admitting: General Practice

## 2020-04-12 ENCOUNTER — Other Ambulatory Visit: Payer: Self-pay

## 2020-04-12 DIAGNOSIS — I4892 Unspecified atrial flutter: Secondary | ICD-10-CM

## 2020-04-12 DIAGNOSIS — M80071S Age-related osteoporosis with current pathological fracture, right ankle and foot, sequela: Secondary | ICD-10-CM

## 2020-04-12 DIAGNOSIS — Z7901 Long term (current) use of anticoagulants: Secondary | ICD-10-CM | POA: Diagnosis not present

## 2020-04-12 LAB — POCT INR: INR: 2.4 (ref 2.0–3.0)

## 2020-04-12 MED ORDER — DENOSUMAB 60 MG/ML ~~LOC~~ SOSY
60.0000 mg | PREFILLED_SYRINGE | Freq: Once | SUBCUTANEOUS | Status: AC
  Administered 2020-04-12: 60 mg via SUBCUTANEOUS

## 2020-04-12 MED ORDER — WARFARIN SODIUM 2.5 MG PO TABS: ORAL_TABLET | ORAL | 3 refills | Status: DC

## 2020-04-12 NOTE — Progress Notes (Signed)
Remote pacemaker transmission.   

## 2020-04-12 NOTE — Progress Notes (Signed)
I have reviewed and agree.

## 2020-04-12 NOTE — Patient Instructions (Signed)
Pre visit review using our clinic review tool, if applicable. No additional management support is needed unless otherwise documented below in the visit note.  Take 1 tablet daily except 2 tablets on Mondays Wednesdays and Fridays. Re-check in 6 weeks.

## 2020-04-16 ENCOUNTER — Other Ambulatory Visit: Payer: Self-pay | Admitting: Internal Medicine

## 2020-04-23 ENCOUNTER — Other Ambulatory Visit: Payer: Self-pay

## 2020-04-23 ENCOUNTER — Telehealth (INDEPENDENT_AMBULATORY_CARE_PROVIDER_SITE_OTHER): Payer: Medicare PPO | Admitting: Internal Medicine

## 2020-04-23 DIAGNOSIS — I4819 Other persistent atrial fibrillation: Secondary | ICD-10-CM | POA: Diagnosis not present

## 2020-04-23 DIAGNOSIS — I421 Obstructive hypertrophic cardiomyopathy: Secondary | ICD-10-CM

## 2020-04-23 DIAGNOSIS — I442 Atrioventricular block, complete: Secondary | ICD-10-CM | POA: Diagnosis not present

## 2020-04-23 NOTE — Progress Notes (Signed)
Electrophysiology TeleHealth Note  Due to national recommendations of social distancing due to [place] 19, an audio telehealth visit is felt to be most appropriate for this patient at this time.  Verbal consent was obtained by me for the telehealth visit today.  The patient does not have capability for a virtual visit.  A phone visit is therefore required today.   Date:  04/23/2020   ID:  Holly Simmons, DOB 05-04-1941, MRN [id number]  Location: patient's home  Provider location:  [place]  Evaluation Performed: Follow-up visit  PCP:  Janith Lima, MD   Electrophysiologist:  Dr Rayann Heman  Chief Complaint:  palpitations  History of Present Illness:    Holly Simmons is a 79 y.o. female who presents via telehealth conferencing today.  Since last being seen in our clinic, the patient reports doing very well.  Today, she denies symptoms of palpitations, chest pain, shortness of breath,  lower extremity edema, dizziness, presyncope, or syncope.  The patient is otherwise without complaint today.     Past Medical History:  Diagnosis Date  . Allergic rhinitis   . Asthma    NOS w/ acute exacerbation  . Blood transfusion without reported diagnosis   . CKD (chronic kidney disease) stage 3, GFR 30-59 ml/min 09/12/2018  . Colon polyps    TUBULAR ADENOMAS AND HYPERPLASTIC  . Complete heart block (HCC)    requiring PPM (MDT) post surgical myomectomy at [facility],  leads are epicardial with abdominal implant, high ventricular threshold at implant  . COPD (chronic obstructive pulmonary disease) ([place])   . Depressive disorder   . Diastolic dysfunction   . DM (diabetes mellitus) ([place])   . Gallstones   . GERD (gastroesophageal reflux disease)   . Gout 08/20/2012  . Heart murmur   . Hyperpotassemia   . Hypersomnia   . Hypertension   . Hypertrophic cardiomyopathy ([place])    s/p surgical myomectomy at [facility] 06/26 complicated by septal VSD post procedure requiring reoperation with  patch closure and tricuspid valve replacement  . Kidney stones   . Myocardial infarction ([place]) 2011  . Obstructive sleep apnea    persistent daytime sleepiness despite cpap  . 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 Psoriasis   . Pyuria   . Renal insufficiency   . 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 Typical atrial flutter ([place]) 9/15    Past Surgical History:  Procedure Laterality Date  . ABDOMINAL HYSTERECTOMY    . APPENDECTOMY    . BREAST CYST EXCISION    . CARDIOVERSION N/A 08/01/2014   Procedure: CARDIOVERSION;  Surgeon: Thayer Headings, MD;  Location: [place];  Service: Cardiovascular;  Laterality: N/A;  . CESAREAN SECTION    . CHOLECYSTECTOMY    . COLONOSCOPY    . EYE SURGERY  [id number]   left eye lens implant  . EYE SURGERY  [id number]   right eye lens implant  . INSERT / REPLACE / REMOVE PACEMAKER  07/13/2012  . PACEMAKER INSERTION  07/02/11   epicardial wires with abdominal implant at [facility] 12/12,  high ventricular lead threshold at implant per Dr Westley Gambles  . PERMANENT PACEMAKER INSERTION N/A 07/13/2012   Procedure: PERMANENT PACEMAKER INSERTION;  Surgeon: Thompson Grayer, MD;  Location: [facility] CATH LAB;  Service: Cardiovascular;  Laterality: N/A;  . POLYPECTOMY    . septal myomectomy for hypertrophic CM  06/23/11   by Dr Evelina Dun at [facility], complicated by septal VSD requiring patch repair and tricuspid valve  replacement  . TONSILLECTOMY    . TRICUSPID VALVE REPLACEMENT  11/12   Medtronic 22mm Mosaic tissue valve  . VSD REPAIR  06/2011    Current Outpatient Medications  Medication Sig Dispense Refill  . allopurinol (ZYLOPRIM) 100 MG tablet TAKE 1 TABLET BY MOUTH DAILY. 90 tablet 1  . B Complex-C (B-COMPLEX WITH VITAMIN C) tablet Take 1 tablet by mouth daily.    . Betamethasone Valerate 0.12 % foam APPLY TO SCALP QD (Patient taking differently: Apply 1 application topically once a week. Apply to scalp.) 100 g 2  . buPROPion (WELLBUTRIN XL) 300 MG 24 hr tablet TAKE 1 TABLET  ONCE DAILY. 90 tablet 0  . ciclopirox (LOPROX) 0.77 % SUSP APPLY 1 PUMP TWICE A DAY. 60 mL 0  . dronabinol (MARINOL) 5 MG capsule Take 1 capsule (5 mg total) by mouth 2 (two) times daily before lunch and supper. 180 capsule 0  . famotidine (PEPCID) 40 MG tablet TAKE 1 TABLET ONCE DAILY. 90 tablet 1  . fexofenadine (ALLEGRA) 180 MG tablet Take 180 mg by mouth daily as needed for allergies.     Marland Kitchen ipratropium (ATROVENT) 0.03 % nasal spray USE 2 SPRAYS IN EACH NOSTRIL 3 TIMES A DAY AS NEEDED. 30 mL 0  . ketoconazole (NIZORAL) 2 % cream APPLY TO AFFECTED AREA TWICE A DAY. 60 g 0  . lamoTRIgine (LAMICTAL) 25 MG tablet TAKE 1 TABLET ONCE DAILY. 90 tablet 0  . metoprolol tartrate (LOPRESSOR) 25 MG tablet TAKE 1 TABLET BY MOUTH TWICE DAILY. 180 tablet 0  . modafinil (PROVIGIL) 100 MG tablet Take 1 tablet (100 mg total) by mouth daily. (Patient taking differently: Take 100 mg by mouth daily as needed. ) 30 tablet 5  . PRESCRIPTION MEDICATION CPAP    . SYMBICORT 80-4.5 MCG/ACT inhaler USE 2 PUFFS TWICE A DAY. 30.6 g 5  . VIIBRYD 40 MG TABS TAKE 1 TABLET ONCE DAILY. 90 tablet 1  . warfarin (COUMADIN) 2.5 MG tablet Take 1 tablet daily except 2 tablets on Mon Wed and Fridays or Take as directed by anticoagulation clinic 40 tablet 3   No current facility-administered medications for this visit.    Allergies:   Petrolatum-zinc oxide; Sulfa antibiotics; Sulfonamide derivatives; Tetanus toxoid; Tetanus toxoids; Other; Nsaids; and Tetanus toxoid, adsorbed   Social History:  The patient  reports that she has never smoked. She has never used smokeless tobacco. She reports that she does not drink alcohol and does not use drugs.   ROS:  Please see the history of present illness.   All other systems are personally reviewed and negative.    Exam:    Vital Signs:  There were no vitals taken for this visit.  Well sounding, alert and conversant   Labs/Other Tests and Data Reviewed:    Recent Labs: 10/07/2019:  TSH 1.36 10/18/2019: ALT 15; B Natriuretic Peptide 411.8; BUN 15; Platelets 155 02/28/2020: Creatinine 1.6; Hemoglobin 10.0; Potassium 4.1; Sodium 139   Wt Readings from Last 3 Encounters:  03/07/20 147 lb (66.7 kg)  03/07/20 147 lb (66.7 kg)  01/09/20 142 lb (64.4 kg)     Last device remote is reviewed from [facility] PDF which reveals normal device function, afib burden is 21%   ASSESSMENT & PLAN:    1.  Complete heart block Doing very well s/p PPM Remotes are up to date and function is normal Of note, she has a second abdominal device which is programmed VVI backup  2. Persistent afib/ atypical atria  flutter Asymptomatic AF burden is 21% She is on coumadin  3. HTN Stable No change required today  4. Hypertrophic CM/ valvular heart disease On coumadin Echo 10/20 reviewed S/p myomectomy and TV replacement for complicated HOCM  5. Chronic renal insufficiency, stage IIIb  (creat 2.6-->1.3) Stable No change required today   Follow-up:   with me in a year   Patient Risk:  after full review of this patients clinical status, I feel that they are at moderate risk at this time.  Today, I have spent 15 minutes with the patient with telehealth technology discussing arrhythmia management .    SignedThompson Grayer, MD  04/23/2020 2:40 PM     [address] [place] [address] [phone] (office) [phone] (fax)

## 2020-04-26 ENCOUNTER — Encounter: Payer: Self-pay | Admitting: Internal Medicine

## 2020-04-27 DIAGNOSIS — G4733 Obstructive sleep apnea (adult) (pediatric): Secondary | ICD-10-CM | POA: Diagnosis not present

## 2020-05-01 ENCOUNTER — Other Ambulatory Visit: Payer: Self-pay

## 2020-05-01 ENCOUNTER — Ambulatory Visit
Admission: RE | Admit: 2020-05-01 | Discharge: 2020-05-01 | Disposition: A | Discharge status: Home / Self Care | Payer: Medicare PPO | Source: Ambulatory Visit | Attending: Internal Medicine | Admitting: Internal Medicine

## 2020-05-01 DIAGNOSIS — Z1231 Encounter for screening mammogram for malignant neoplasm of breast: Secondary | ICD-10-CM | POA: Diagnosis not present

## 2020-05-03 LAB — HM MAMMOGRAPHY

## 2020-05-16 ENCOUNTER — Encounter: Payer: Self-pay | Admitting: Internal Medicine

## 2020-05-17 ENCOUNTER — Other Ambulatory Visit (INDEPENDENT_AMBULATORY_CARE_PROVIDER_SITE_OTHER): Payer: Medicare PPO

## 2020-05-17 ENCOUNTER — Other Ambulatory Visit: Payer: Self-pay | Admitting: Internal Medicine

## 2020-05-17 ENCOUNTER — Other Ambulatory Visit: Payer: Self-pay

## 2020-05-17 ENCOUNTER — Ambulatory Visit: Payer: Medicare PPO | Admitting: General Practice

## 2020-05-17 ENCOUNTER — Other Ambulatory Visit: Payer: Self-pay | Admitting: General Practice

## 2020-05-17 DIAGNOSIS — Z7901 Long term (current) use of anticoagulants: Secondary | ICD-10-CM

## 2020-05-17 DIAGNOSIS — D500 Iron deficiency anemia secondary to blood loss (chronic): Secondary | ICD-10-CM

## 2020-05-17 DIAGNOSIS — I4892 Unspecified atrial flutter: Secondary | ICD-10-CM

## 2020-05-17 LAB — CBC
HCT: 31.2 % — ABNORMAL LOW (ref 36.0–46.0)
Hemoglobin: 10.4 g/dL — ABNORMAL LOW (ref 12.0–15.0)
MCHC: 33.2 g/dL (ref 30.0–36.0)
MCV: 95.2 fl (ref 78.0–100.0)
Platelets: 55 10*3/uL — ABNORMAL LOW (ref 150.0–400.0)
RBC: 3.28 Mil/uL — ABNORMAL LOW (ref 3.87–5.11)
RDW: 16.3 % — ABNORMAL HIGH (ref 11.5–15.5)
WBC: 4.2 10*3/uL (ref 4.0–10.5)

## 2020-05-17 LAB — POCT INR: INR: 2.6 (ref 2.0–3.0)

## 2020-05-17 NOTE — Progress Notes (Signed)
I have reviewed and agree.

## 2020-05-17 NOTE — Progress Notes (Signed)
Patient ID: Holly Simmons, female   DOB: 1941-07-05, 79 y.o.   MRN: [id number]     78 y.o. female with a complex history of HOCM.  Underwent surgery at [facility] 06/23/11  She was seen in followup by Dr. Rayann Heman 11/15/11 . 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 She underwent cardioversion for atrial flutter and was placed on Coumadin and amiodarone. Epicardial leads are known to have a high threshold.   Subsequently had new pacer placed 1/15 . Could not cross TVR for stable lead but got good threshholds in CS.   PAF with previous [facility] but reversion per Dr Rayann Heman rate control and anticoagulation strategy   Echo 05/19/19 EF normal mean gradient across TV 5 mmHg. 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 Told her to talk to Dr Ronnald Ramp about getting some PT To help with her strength and balance   Echo10/21/21 reviewed:  Low normal EF moderate MR/mild MS , normal biophrosthetic TVR , VSD closed Mild AS/AR  Cannot get tetanus shot she is allergic Some easy bruising labs and INR ok   ROS: Denies fever, malais, weight loss, blurry vision, decreased visual acuity, cough, sputum, SOB, hemoptysis, pleuritic pain, palpitaitons, heartburn, abdominal pain, melena, lower extremity edema, claudication, or rash.  All other systems reviewed and negative  General: BP 132/74   Pulse 96   Ht 5\' 2"  (1.575 m)   Wt 162 lb (73.5 kg)   SpO2 96%   BMI 29.63 kg/m   Affect appropriate Healthy:  appears stated age 64: normal Neck supple with no adenopathy JVP normal no bruits no thyromegaly Lungs clear with no wheezing and good diaphragmatic  motion Heart:  S1/S2 no murmur, no rub, gallop or click PMI normal post sternotomy PPM under left clavicle  Abdomen: benighn, BS positve, no tenderness, no AAA no bruit.  No HSM or HJR Distal pulses intact with no bruits Plus one bilateral edema with varicosities  Neuro non-focal Bruising on arms  No muscular weakness     Current Outpatient Medications  Medication Sig Dispense Refill  . allopurinol (ZYLOPRIM) 100 MG tablet TAKE 1 TABLET BY MOUTH DAILY. 90 tablet 1  . B Complex-C (B-COMPLEX WITH VITAMIN C) tablet Take 1 tablet by mouth daily.    . Betamethasone Valerate 0.12 % foam APPLY TO SCALP QD (Patient taking differently: Apply 1 application topically once a week. Apply to scalp.) 100 g 2  . buPROPion (WELLBUTRIN XL) 300 MG 24 hr tablet TAKE 1 TABLET ONCE DAILY. 90 tablet 0  . ciclopirox (LOPROX) 0.77 % SUSP APPLY 1 PUMP TWICE A DAY. 60 mL 0  . dronabinol (MARINOL) 5 MG capsule Take 1 capsule (5 mg total) by mouth 2 (two) times daily before lunch and supper. 180 capsule 0  . famotidine (PEPCID) 40 MG tablet TAKE 1 TABLET ONCE DAILY. 90 tablet 1  . fexofenadine (ALLEGRA) 180 MG tablet Take 180 mg by mouth daily as needed for allergies.     Marland Kitchen ipratropium (ATROVENT) 0.03 % nasal spray USE 2 SPRAYS IN EACH NOSTRIL 3 TIMES A DAY AS NEEDED. 30 mL 0  .  ketoconazole (NIZORAL) 2 % cream APPLY TO AFFECTED AREA TWICE A DAY. 60 g 0  . lamoTRIgine (LAMICTAL) 25 MG tablet TAKE 1 TABLET ONCE DAILY. 90 tablet 0  . metoprolol tartrate (LOPRESSOR) 25 MG tablet TAKE 1 TABLET BY MOUTH TWICE DAILY. 180 tablet 0  . modafinil (PROVIGIL) 100 MG tablet Take 1 tablet (100 mg total) by mouth daily. (Patient taking differently: Take 100 mg by mouth daily as needed. ) 30 tablet 5  . PRESCRIPTION MEDICATION CPAP    . SYMBICORT 80-4.5 MCG/ACT inhaler USE 2 PUFFS TWICE A DAY. 30.6 g 5  . VIIBRYD 40 MG TABS TAKE 1 TABLET ONCE DAILY. 90 tablet 1  . warfarin (COUMADIN) 2.5 MG tablet Take 1 tablet daily except 2  tablets on Mon Wed and Fridays or Take as directed by anticoagulation clinic 40 tablet 3   No current facility-administered medications for this visit.    Allergies  Petrolatum-zinc oxide; Sulfa antibiotics; Sulfonamide derivatives; Tetanus toxoid; Tetanus toxoids; Other; Nsaids; and Tetanus toxoid, adsorbed  Electrocardiogram:  F.utter with pacing RBBB   Assessment and Plan  HOCM: post myectomy with complication LVOT gradient gone now VSD sealed post MI/myectomy complication Pacer  Thresholds ok f/u [facility] ? Changed to VVIR mode CS pacer Is under left clavicle and she has a 2nd abdominal device programmed as VVI backup  TV replacement  Mean gradient 5/ peak 8 mmHg with mild TR  Anticoagulation  Chronic afib with anticoagulation and rate control Sees coumadin clinic and INR;s Rx most recent 05/17/20  A/CRF:  Renal US reviewed from 09/17/18 no obstruction medical cortical disease f/u renal Cr improved from 2.6 To 1.6 02/28/20   F/U with EP check pacer in 6 months F/U with me in a year   [employer]

## 2020-05-17 NOTE — Patient Instructions (Signed)
Pre visit review using our clinic review tool, if applicable. No additional management support is needed unless otherwise documented below in the visit note.  Take 1 tablet daily except 2 tablets on Mondays Wednesdays and Fridays. Re-check in 5 weeks.  Pt has significant bruising on arms and legs.  She is having a CBC today.  Dr. Ronnald Ramp is aware of the findings.

## 2020-05-18 ENCOUNTER — Other Ambulatory Visit (HOSPITAL_COMMUNITY): Payer: Medicare PPO

## 2020-05-18 ENCOUNTER — Telehealth: Payer: Self-pay

## 2020-05-18 DIAGNOSIS — I421 Obstructive hypertrophic cardiomyopathy: Secondary | ICD-10-CM

## 2020-05-18 DIAGNOSIS — I480 Paroxysmal atrial fibrillation: Secondary | ICD-10-CM

## 2020-05-18 NOTE — Telephone Encounter (Signed)
Called patient's son to let him know Dr. Johnsie Cancel wants his mother to have an echo before her appointment. Saw patient had an echo scheduled today, but son stated that there is nobody who could bring her today. Will send message to scheduler to call and reschedule.

## 2020-05-18 NOTE — Telephone Encounter (Signed)
-----   Message from Josue Hector, MD sent at 05/17/2020  5:39 PM EDT ----- She is do for echo can we get it done before office visit 10/27 ?

## 2020-05-21 ENCOUNTER — Other Ambulatory Visit: Payer: Self-pay | Admitting: Internal Medicine

## 2020-05-21 DIAGNOSIS — F3341 Major depressive disorder, recurrent, in partial remission: Secondary | ICD-10-CM

## 2020-05-21 DIAGNOSIS — F418 Other specified anxiety disorders: Secondary | ICD-10-CM

## 2020-05-24 ENCOUNTER — Ambulatory Visit: Payer: Medicare PPO

## 2020-05-24 ENCOUNTER — Ambulatory Visit (HOSPITAL_COMMUNITY): Payer: Medicare PPO | Attending: Cardiology

## 2020-05-24 ENCOUNTER — Other Ambulatory Visit: Payer: Self-pay

## 2020-05-24 DIAGNOSIS — I421 Obstructive hypertrophic cardiomyopathy: Secondary | ICD-10-CM

## 2020-05-24 DIAGNOSIS — I480 Paroxysmal atrial fibrillation: Secondary | ICD-10-CM | POA: Diagnosis not present

## 2020-05-24 LAB — ECHOCARDIOGRAM COMPLETE
AR max vel: 0.67 cm2
AV Area VTI: 0.69 cm2
AV Area mean vel: 0.63 cm2
AV Mean grad: 12 mmHg
AV Peak grad: 22.9 mmHg
Ao pk vel: 2.4 m/s
Area-P 1/2: 2.25 cm2
MV M vel: 5.14 m/s
MV Peak grad: 105.7 mmHg
P 1/2 time: 348 msec
Radius: 0.6 cm
S' Lateral: 3.3 cm

## 2020-05-25 ENCOUNTER — Telehealth: Payer: Self-pay | Admitting: Cardiovascular Disease

## 2020-05-25 NOTE — Telephone Encounter (Signed)
Patient's son [name] called in returning phone call. Did not see notes as to who called - patient did have testing done yesterday that [name] would like to review. Please call/advise.   Thank you!

## 2020-05-25 NOTE — Telephone Encounter (Signed)
The patient's son has been notified of the result and verbalized understanding.  All questions (if any) were answered. Michaelyn Barter, RN 05/25/2020 3:18 PM

## 2020-05-30 ENCOUNTER — Other Ambulatory Visit: Payer: Self-pay

## 2020-05-30 ENCOUNTER — Encounter: Payer: Self-pay | Admitting: Internal Medicine

## 2020-05-30 ENCOUNTER — Ambulatory Visit: Payer: Medicare PPO | Admitting: Cardiovascular Disease

## 2020-05-30 VITALS — BP 132/74 | HR 96 | Ht 62.0 in | Wt 162.0 lb

## 2020-05-30 DIAGNOSIS — Z950 Presence of cardiac pacemaker: Secondary | ICD-10-CM | POA: Diagnosis not present

## 2020-05-30 DIAGNOSIS — Z954 Presence of other heart-valve replacement: Secondary | ICD-10-CM | POA: Diagnosis not present

## 2020-05-30 DIAGNOSIS — I421 Obstructive hypertrophic cardiomyopathy: Secondary | ICD-10-CM | POA: Diagnosis not present

## 2020-05-30 NOTE — Patient Instructions (Addendum)
Medication Instructions:  *If you need a refill on your cardiac medications before your next appointment, please call your pharmacy*  Lab Work: If you have labs (blood work) drawn today and your tests are completely normal, you will receive your results only by: Marland Kitchen MyChart Message (if you have MyChart) OR . A paper copy in the mail If you have any lab test that is abnormal or we need to change your treatment, we will call you to review the results.  Testing/Procedures: None ordered today.   Follow-Up: At [facility], you and your health needs are our priority.  As part of our continuing mission to provide you with exceptional heart care, we have created designated Provider Care Teams.  These Care Teams include your primary Cardiologist (physician) and Advanced Practice Providers (APPs -  Physician Assistants and Nurse Practitioners) who all work together to provide you with the care you need, when you need it.  We recommend signing up for the patient portal called "MyChart".  Sign up information is provided on this After Visit Summary.  MyChart is used to connect with patients for Virtual Visits (Telemedicine).  Patients are able to view lab/test results, encounter notes, upcoming appointments, etc.  Non-urgent messages can be sent to your provider as well.   To learn more about what you can do with MyChart, go to [email or web address].    Your next appointment:   6 month   The format for your next appointment:   In Person  Provider:   You may see Jenkins Rouge, MD or one of the following Advanced Practice Providers on your designated Care Team:    Truitt Merle, NP  Cecilie Kicks, NP  Kathyrn Drown, NP

## 2020-06-18 ENCOUNTER — Other Ambulatory Visit: Payer: Self-pay | Admitting: Internal Medicine

## 2020-06-18 DIAGNOSIS — R64 Cachexia: Secondary | ICD-10-CM

## 2020-06-20 DIAGNOSIS — H1131 Conjunctival hemorrhage, right eye: Secondary | ICD-10-CM | POA: Diagnosis not present

## 2020-06-21 ENCOUNTER — Ambulatory Visit: Payer: Medicare PPO | Admitting: Internal Medicine

## 2020-06-21 ENCOUNTER — Other Ambulatory Visit: Payer: Self-pay

## 2020-06-21 DIAGNOSIS — I4892 Unspecified atrial flutter: Secondary | ICD-10-CM

## 2020-06-21 DIAGNOSIS — Z7901 Long term (current) use of anticoagulants: Secondary | ICD-10-CM

## 2020-06-21 LAB — POCT INR: INR: 2.8 (ref 2.0–3.0)

## 2020-06-21 NOTE — Progress Notes (Signed)
I have reviewed and agree.

## 2020-06-21 NOTE — Patient Instructions (Addendum)
Pre visit review using our clinic review tool, if applicable. No additional management support is needed unless otherwise documented below in the visit note.  Take 1 tablet today and tomorrow and then change dosage and take 1 tablet daily except take 2 tablets on Mondays and Thursdays starting 11/29.  Re-check in 3 weeks.

## 2020-07-10 ENCOUNTER — Ambulatory Visit (INDEPENDENT_AMBULATORY_CARE_PROVIDER_SITE_OTHER): Payer: Medicare PPO

## 2020-07-10 DIAGNOSIS — I442 Atrioventricular block, complete: Secondary | ICD-10-CM | POA: Diagnosis not present

## 2020-07-10 LAB — CUP PACEART REMOTE DEVICE CHECK
Battery Impedance: 1050 Ohm
Battery Remaining Longevity: 52 mo
Battery Voltage: 2.76 V
Brady Statistic AP VP Percent: 51 %
Brady Statistic AP VS Percent: 0 %
Brady Statistic AS VP Percent: 48 %
Brady Statistic AS VS Percent: 0 %
Date Time Interrogation Session: 20211207083826
Implantable Lead Implant Date: 20131210
Implantable Lead Implant Date: 20131210
Implantable Lead Location: 753858
Implantable Lead Location: 753859
Implantable Lead Model: 4296
Implantable Lead Model: 5076
Implantable Pulse Generator Implant Date: 20131210
Lead Channel Impedance Value: 418 Ohm
Lead Channel Impedance Value: 728 Ohm
Lead Channel Pacing Threshold Amplitude: 0.875 V
Lead Channel Pacing Threshold Amplitude: 1.25 V
Lead Channel Pacing Threshold Pulse Width: 0.4 ms
Lead Channel Pacing Threshold Pulse Width: 0.4 ms
Lead Channel Setting Pacing Amplitude: 2 V
Lead Channel Setting Pacing Amplitude: 3 V
Lead Channel Setting Pacing Pulse Width: 0.4 ms
Lead Channel Setting Sensing Sensitivity: 8 mV

## 2020-07-12 ENCOUNTER — Ambulatory Visit: Payer: Medicare PPO | Admitting: Internal Medicine

## 2020-07-12 ENCOUNTER — Emergency Department (HOSPITAL_COMMUNITY): Payer: Medicare PPO

## 2020-07-12 ENCOUNTER — Encounter: Payer: Self-pay | Admitting: Internal Medicine

## 2020-07-12 ENCOUNTER — Other Ambulatory Visit: Payer: Self-pay

## 2020-07-12 ENCOUNTER — Ambulatory Visit (INDEPENDENT_AMBULATORY_CARE_PROVIDER_SITE_OTHER): Payer: Medicare PPO | Admitting: General Practice

## 2020-07-12 ENCOUNTER — Other Ambulatory Visit: Payer: Self-pay | Admitting: General Practice

## 2020-07-12 ENCOUNTER — Encounter (HOSPITAL_COMMUNITY): Payer: Self-pay

## 2020-07-12 ENCOUNTER — Emergency Department (HOSPITAL_COMMUNITY)
Admission: EM | Admit: 2020-07-12 | Discharge: 2020-07-13 | Disposition: A | Discharge status: Home / Self Care | Payer: Medicare PPO | Attending: Emergency Medicine | Admitting: Emergency Medicine

## 2020-07-12 VITALS — BP 148/92 | HR 91 | Temp 98.2°F | Ht 62.0 in | Wt 175.0 lb

## 2020-07-12 DIAGNOSIS — Z7901 Long term (current) use of anticoagulants: Secondary | ICD-10-CM

## 2020-07-12 DIAGNOSIS — N185 Chronic kidney disease, stage 5: Secondary | ICD-10-CM

## 2020-07-12 DIAGNOSIS — M47812 Spondylosis without myelopathy or radiculopathy, cervical region: Secondary | ICD-10-CM | POA: Diagnosis not present

## 2020-07-12 DIAGNOSIS — G9389 Other specified disorders of brain: Secondary | ICD-10-CM | POA: Diagnosis not present

## 2020-07-12 DIAGNOSIS — S199XXA Unspecified injury of neck, initial encounter: Secondary | ICD-10-CM | POA: Diagnosis not present

## 2020-07-12 DIAGNOSIS — D500 Iron deficiency anemia secondary to blood loss (chronic): Secondary | ICD-10-CM

## 2020-07-12 DIAGNOSIS — Z0000 Encounter for general adult medical examination without abnormal findings: Secondary | ICD-10-CM | POA: Diagnosis not present

## 2020-07-12 DIAGNOSIS — R600 Localized edema: Secondary | ICD-10-CM

## 2020-07-12 DIAGNOSIS — D696 Thrombocytopenia, unspecified: Secondary | ICD-10-CM

## 2020-07-12 DIAGNOSIS — S0181XA Laceration without foreign body of other part of head, initial encounter: Secondary | ICD-10-CM

## 2020-07-12 DIAGNOSIS — R1313 Dysphagia, pharyngeal phase: Secondary | ICD-10-CM | POA: Diagnosis not present

## 2020-07-12 DIAGNOSIS — M4312 Spondylolisthesis, cervical region: Secondary | ICD-10-CM | POA: Diagnosis not present

## 2020-07-12 DIAGNOSIS — I5032 Chronic diastolic (congestive) heart failure: Secondary | ICD-10-CM | POA: Diagnosis not present

## 2020-07-12 DIAGNOSIS — S0101XA Laceration without foreign body of scalp, initial encounter: Secondary | ICD-10-CM | POA: Insufficient documentation

## 2020-07-12 DIAGNOSIS — I4892 Unspecified atrial flutter: Secondary | ICD-10-CM | POA: Diagnosis not present

## 2020-07-12 DIAGNOSIS — W19XXXA Unspecified fall, initial encounter: Secondary | ICD-10-CM

## 2020-07-12 DIAGNOSIS — Y92002 Bathroom of unspecified non-institutional (private) residence single-family (private) house as the place of occurrence of the external cause: Secondary | ICD-10-CM | POA: Diagnosis not present

## 2020-07-12 DIAGNOSIS — M160 Bilateral primary osteoarthritis of hip: Secondary | ICD-10-CM | POA: Diagnosis not present

## 2020-07-12 DIAGNOSIS — I517 Cardiomegaly: Secondary | ICD-10-CM | POA: Diagnosis not present

## 2020-07-12 DIAGNOSIS — S022XXA Fracture of nasal bones, initial encounter for closed fracture: Secondary | ICD-10-CM | POA: Diagnosis not present

## 2020-07-12 DIAGNOSIS — I10 Essential (primary) hypertension: Secondary | ICD-10-CM | POA: Diagnosis not present

## 2020-07-12 DIAGNOSIS — W01198A Fall on same level from slipping, tripping and stumbling with subsequent striking against other object, initial encounter: Secondary | ICD-10-CM | POA: Diagnosis not present

## 2020-07-12 DIAGNOSIS — E785 Hyperlipidemia, unspecified: Secondary | ICD-10-CM

## 2020-07-12 DIAGNOSIS — M2578 Osteophyte, vertebrae: Secondary | ICD-10-CM | POA: Diagnosis not present

## 2020-07-12 DIAGNOSIS — I480 Paroxysmal atrial fibrillation: Secondary | ICD-10-CM

## 2020-07-12 DIAGNOSIS — Y9301 Activity, walking, marching and hiking: Secondary | ICD-10-CM | POA: Diagnosis not present

## 2020-07-12 DIAGNOSIS — J320 Chronic maxillary sinusitis: Secondary | ICD-10-CM | POA: Diagnosis not present

## 2020-07-12 DIAGNOSIS — G319 Degenerative disease of nervous system, unspecified: Secondary | ICD-10-CM | POA: Diagnosis not present

## 2020-07-12 DIAGNOSIS — S0992XA Unspecified injury of nose, initial encounter: Secondary | ICD-10-CM | POA: Diagnosis present

## 2020-07-12 DIAGNOSIS — R58 Hemorrhage, not elsewhere classified: Secondary | ICD-10-CM | POA: Diagnosis not present

## 2020-07-12 DIAGNOSIS — R52 Pain, unspecified: Secondary | ICD-10-CM | POA: Diagnosis not present

## 2020-07-12 DIAGNOSIS — Z0001 Encounter for general adult medical examination with abnormal findings: Secondary | ICD-10-CM | POA: Insufficient documentation

## 2020-07-12 DIAGNOSIS — J3489 Other specified disorders of nose and nasal sinuses: Secondary | ICD-10-CM | POA: Diagnosis not present

## 2020-07-12 DIAGNOSIS — S3993XA Unspecified injury of pelvis, initial encounter: Secondary | ICD-10-CM | POA: Diagnosis not present

## 2020-07-12 LAB — CBC
HCT: 33.2 % — ABNORMAL LOW (ref 36.0–46.0)
Hemoglobin: 10.7 g/dL — ABNORMAL LOW (ref 12.0–15.0)
MCH: 31.8 pg (ref 26.0–34.0)
MCHC: 32.2 g/dL (ref 30.0–36.0)
MCV: 98.5 fL (ref 80.0–100.0)
Platelets: 155 10*3/uL (ref 150–400)
RBC: 3.37 MIL/uL — ABNORMAL LOW (ref 3.87–5.11)
RDW: 15 % (ref 11.5–15.5)
WBC: 5.1 10*3/uL (ref 4.0–10.5)
nRBC: 0 % (ref 0.0–0.2)

## 2020-07-12 LAB — COMPREHENSIVE METABOLIC PANEL
ALT: 10 U/L (ref 0–44)
AST: 19 U/L (ref 15–41)
Albumin: 3.7 g/dL (ref 3.5–5.0)
Alkaline Phosphatase: 101 U/L (ref 38–126)
Anion gap: 6 (ref 5–15)
BUN: 11 mg/dL (ref 8–23)
CO2: 30 mmol/L (ref 22–32)
Calcium: 9.5 mg/dL (ref 8.9–10.3)
Chloride: 101 mmol/L (ref 98–111)
Creatinine, Ser: 1.26 mg/dL — ABNORMAL HIGH (ref 0.44–1.00)
GFR, Estimated: 44 mL/min — ABNORMAL LOW (ref >60)
Glucose, Bld: 132 mg/dL — ABNORMAL HIGH (ref 70–99)
Potassium: 3.6 mmol/L (ref 3.5–5.1)
Sodium: 137 mmol/L (ref 135–145)
Total Bilirubin: 0.9 mg/dL (ref 0.3–1.2)
Total Protein: 6.5 g/dL (ref 6.5–8.1)

## 2020-07-12 LAB — I-STAT CHEM 8, ED
BUN: 12 mg/dL (ref 8–23)
Calcium, Ion: 1.29 mmol/L (ref 1.15–1.40)
Chloride: 100 mmol/L (ref 98–111)
Creatinine, Ser: 1.2 mg/dL — ABNORMAL HIGH (ref 0.44–1.00)
Glucose, Bld: 125 mg/dL — ABNORMAL HIGH (ref 70–99)
HCT: 34 % — ABNORMAL LOW (ref 36.0–46.0)
Hemoglobin: 11.6 g/dL — ABNORMAL LOW (ref 12.0–15.0)
Potassium: 3.6 mmol/L (ref 3.5–5.1)
Sodium: 141 mmol/L (ref 135–145)
TCO2: 29 mmol/L (ref 22–32)

## 2020-07-12 LAB — PROTIME-INR
INR: 2.2 — ABNORMAL HIGH (ref 0.8–1.2)
Prothrombin Time: 23.4 seconds — ABNORMAL HIGH (ref 11.4–15.2)

## 2020-07-12 LAB — POCT INR: INR: 2.2 (ref 2.0–3.0)

## 2020-07-12 MED ORDER — HYDROCODONE-ACETAMINOPHEN 5-325 MG PO TABS: 1.0000 | ORAL_TABLET | Freq: Four times a day (QID) | ORAL | 0 refills | Status: DC | PRN

## 2020-07-12 MED ORDER — CEPHALEXIN 500 MG PO CAPS: 500.0000 mg | ORAL_CAPSULE | Freq: Four times a day (QID) | ORAL | 0 refills | Status: AC

## 2020-07-12 MED ORDER — LIDOCAINE-EPINEPHRINE 1 %-1:100000 IJ SOLN
10.0000 mL | Freq: Once | INTRAMUSCULAR | Status: AC
  Administered 2020-07-12: 10 mL via INTRADERMAL
  Filled 2020-07-12: qty 1

## 2020-07-12 MED ORDER — TORSEMIDE 20 MG PO TABS: 20.0000 mg | ORAL_TABLET | Freq: Every day | ORAL | 0 refills | Status: DC

## 2020-07-12 MED ORDER — OXYCODONE-ACETAMINOPHEN 5-325 MG PO TABS
1.0000 | ORAL_TABLET | Freq: Once | ORAL | Status: AC
  Administered 2020-07-12: 1 via ORAL
  Filled 2020-07-12: qty 1

## 2020-07-12 MED ORDER — BACITRACIN ZINC 500 UNIT/GM EX OINT
TOPICAL_OINTMENT | Freq: Once | CUTANEOUS | Status: AC
  Filled 2020-07-12: qty 0.9

## 2020-07-12 MED ORDER — CEPHALEXIN 250 MG PO CAPS
500.0000 mg | ORAL_CAPSULE | Freq: Once | ORAL | Status: AC
  Administered 2020-07-13: 500 mg via ORAL
  Filled 2020-07-12: qty 2

## 2020-07-12 NOTE — Patient Instructions (Signed)
Health Maintenance, Female Adopting a healthy lifestyle and getting preventive care are important in promoting health and wellness. Ask your health care provider about:  The right schedule for you to have regular tests and exams.  Things you can do on your own to prevent diseases and keep yourself healthy. What should I know about diet, weight, and exercise? Eat a healthy diet   Eat a diet that includes plenty of vegetables, fruits, low-fat dairy products, and lean protein.  Do not eat a lot of foods that are high in solid fats, added sugars, or sodium. Maintain a healthy weight Body mass index (BMI) is used to identify weight problems. It estimates body fat based on height and weight. Your health care provider can help determine your BMI and help you achieve or maintain a healthy weight. Get regular exercise Get regular exercise. This is one of the most important things you can do for your health. Most adults should:  Exercise for at least 150 minutes each week. The exercise should increase your heart rate and make you sweat (moderate-intensity exercise).  Do strengthening exercises at least twice a week. This is in addition to the moderate-intensity exercise.  Spend less time sitting. Even light physical activity can be beneficial. Watch cholesterol and blood lipids Have your blood tested for lipids and cholesterol at 77 years of age, then have this test every 5 years. Have your cholesterol levels checked more often if:  Your lipid or cholesterol levels are high.  You are older than 77 years of age.  You are at high risk for heart disease. What should I know about cancer screening? Depending on your health history and family history, you may need to have cancer screening at various ages. This may include screening for:  Breast cancer.  Cervical cancer.  Colorectal cancer.  Skin cancer.  Lung cancer. What should I know about heart disease, diabetes, and high blood  pressure? Blood pressure and heart disease  High blood pressure causes heart disease and increases the risk of stroke. This is more likely to develop in people who have high blood pressure readings, are of African descent, or are overweight.  Have your blood pressure checked: ? Every 3-5 years if you are 18-39 years of age. ? Every year if you are 40 years old or older. Diabetes Have regular diabetes screenings. This checks your fasting blood sugar level. Have the screening done:  Once every three years after age 40 if you are at a normal weight and have a low risk for diabetes.  More often and at a younger age if you are overweight or have a high risk for diabetes. What should I know about preventing infection? Hepatitis B If you have a higher risk for hepatitis B, you should be screened for this virus. Talk with your health care provider to find out if you are at risk for hepatitis B infection. Hepatitis C Testing is recommended for:  Everyone born from 1945 through 1965.  Anyone with known risk factors for hepatitis C. Sexually transmitted infections (STIs)  Get screened for STIs, including gonorrhea and chlamydia, if: ? You are sexually active and are younger than 77 years of age. ? You are older than 77 years of age and your health care provider tells you that you are at risk for this type of infection. ? Your sexual activity has changed since you were last screened, and you are at increased risk for chlamydia or gonorrhea. Ask your health care provider if   you are at risk.  Ask your health care provider about whether you are at high risk for HIV. Your health care provider may recommend a prescription medicine to help prevent HIV infection. If you choose to take medicine to prevent HIV, you should first get tested for HIV. You should then be tested every 3 months for as long as you are taking the medicine. Pregnancy  If you are about to stop having your period (premenopausal) and  you may become pregnant, seek counseling before you get pregnant.  Take 400 to 800 micrograms (mcg) of folic acid every day if you become pregnant.  Ask for birth control (contraception) if you want to prevent pregnancy. Osteoporosis and menopause Osteoporosis is a disease in which the bones lose minerals and strength with aging. This can result in bone fractures. If you are 65 years old or older, or if you are at risk for osteoporosis and fractures, ask your health care provider if you should:  Be screened for bone loss.  Take a calcium or vitamin D supplement to lower your risk of fractures.  Be given hormone replacement therapy (HRT) to treat symptoms of menopause. Follow these instructions at home: Lifestyle  Do not use any products that contain nicotine or tobacco, such as cigarettes, e-cigarettes, and chewing tobacco. If you need help quitting, ask your health care provider.  Do not use street drugs.  Do not share needles.  Ask your health care provider for help if you need support or information about quitting drugs. Alcohol use  Do not drink alcohol if: ? Your health care provider tells you not to drink. ? You are pregnant, may be pregnant, or are planning to become pregnant.  If you drink alcohol: ? Limit how much you use to 0-1 drink a day. ? Limit intake if you are breastfeeding.  Be aware of how much alcohol is in your drink. In the [place] one drink equals one 12 oz bottle of beer (355 mL), one 5 oz glass of wine (148 mL), or one 1 oz glass of hard liquor (44 mL). General instructions  Schedule regular health, dental, and eye exams.  Stay current with your vaccines.  Tell your health care provider if: ? You often feel depressed. ? You have ever been abused or do not feel safe at home. Summary  Adopting a healthy lifestyle and getting preventive care are important in promoting health and wellness.  Follow your health care provider's instructions about healthy  diet, exercising, and getting tested or screened for diseases.  Follow your health care provider's instructions on monitoring your cholesterol and blood pressure. This information is not intended to replace advice given to you by your health care provider. Make sure you discuss any questions you have with your health care provider. Document Revised: 07/14/2018 Document Reviewed: 07/14/2018 Elsevier Patient Education  2020 [employer]  

## 2020-07-12 NOTE — Discharge Instructions (Signed)
Keep the wound clean and dry for the first 24 hours. After that you may gently clean the wound with soap and water. Make sure to pat dry the wound before covering it with any dressing. You can use topical antibiotic ointment and bandage. Ice and elevate for pain relief.   Take antibiotics as directed.  As we discussed, it is very important that you do not blow your nose as this could make the nasal fracture worse. Call the ENT doctor (Dr. Benjamine Mola) and follow up with the doctor.   You can take Tylenol or Ibuprofen as directed for pain. You can alternate Tylenol and Ibuprofen every 4 hours for additional pain relief.   Take pain medications as directed for break through pain. Do not drive or operate machinery while taking this medication.   Return to the Emergency Department, your primary care doctor, or the [facility] [place] [facility] in 7 days for suture removal.   Monitor closely for any signs of infection. Return to the Emergency Department for any worsening redness/swelling of the area that begins to spread, drainage from the site, worsening pain, fever or any other worsening or concerning symptoms.

## 2020-07-12 NOTE — ED Triage Notes (Signed)
Pt BIB [facility] from home as a Level 2 Trauma after Patient fell ambulating.  Pt slipped on floor and fell. Pt does take Coumadin for Hx of A. Fib and Hx of Heart Valve Replacement.   Negative LOC. Pt Alert & Oriented x4. GCS 15. Neuro Intact  C-Collar in Place.   VSS with EMS.

## 2020-07-12 NOTE — Patient Instructions (Addendum)
Pre visit review using our clinic review tool, if applicable. No additional management support is needed unless otherwise documented below in the visit note.  Continue to take 1 tablet daily except take 2 tablets on Mondays and Thursdays.    Re-check in 4 weeks in the lab after appointment with Dr. Ronnald Ramp.

## 2020-07-12 NOTE — Progress Notes (Signed)
Subjective:  Patient ID: Holly Simmons, female    DOB: 07/23/1941  Age: 78 y.o. MRN: [id number]  CC: Annual Exam, Hypertension, Anemia, and Congestive Heart Failure  This visit occurred during the SARS-CoV-2 public health emergency.  Safety protocols were in place, including screening questions prior to the visit, additional usage of staff PPE, and extensive cleaning of exam room while observing appropriate contact time as indicated for disinfecting solutions.    HPI Holly Simmons presents for a CPX.  She complains of a several week history of worsening and uncomfortable lower extremity edema.  She was previously treated with a diuretic for diastolic dysfunction but due to renal insufficiency the diuretics have been discontinued.  She denies chest pain, shortness of breath, palpitations, dizziness, lightheadedness, or near syncope.  Since I last saw her she has had the sensation that food gets stuck in her upper esophagus around her throat.  She said even bread get stuck.  She denies dysphagia, heartburn, coughing, or swelling around her neck.  Outpatient Medications Prior to Visit  Medication Sig Dispense Refill  . allopurinol (ZYLOPRIM) 100 MG tablet TAKE 1 TABLET BY MOUTH DAILY. 90 tablet 1  . B Complex-C (B-COMPLEX WITH VITAMIN C) tablet Take 1 tablet by mouth daily.    . Betamethasone Valerate 0.12 % foam APPLY TO SCALP QD (Patient taking differently: Apply 1 application topically once a week. Apply to scalp.) 100 g 2  . buPROPion (WELLBUTRIN XL) 300 MG 24 hr tablet TAKE 1 TABLET ONCE DAILY. 90 tablet 0  . ciclopirox (LOPROX) 0.77 % SUSP APPLY 1 PUMP TWICE A DAY. 60 mL 0  . dronabinol (MARINOL) 5 MG capsule TAKE 1 CAPSULE TWICE DAILY BEFORE LUNCH AND SUPPER. 180 capsule 0  . famotidine (PEPCID) 40 MG tablet TAKE 1 TABLET ONCE DAILY. 90 tablet 1  . fexofenadine (ALLEGRA) 180 MG tablet Take 180 mg by mouth daily as needed for allergies.    Marland Kitchen ipratropium (ATROVENT) 0.03 %  nasal spray USE 2 SPRAYS IN EACH NOSTRIL 3 TIMES A DAY AS NEEDED. 30 mL 0  . ketoconazole (NIZORAL) 2 % cream APPLY TO AFFECTED AREA TWICE A DAY. 60 g 0  . lamoTRIgine (LAMICTAL) 25 MG tablet TAKE 1 TABLET ONCE DAILY. 90 tablet 0  . metoprolol tartrate (LOPRESSOR) 25 MG tablet TAKE 1 TABLET BY MOUTH TWICE DAILY. 180 tablet 0  . modafinil (PROVIGIL) 100 MG tablet Take 1 tablet (100 mg total) by mouth daily. (Patient taking differently: Take 100 mg by mouth daily as needed.) 30 tablet 5  . PRESCRIPTION MEDICATION CPAP    . SYMBICORT 80-4.5 MCG/ACT inhaler USE 2 PUFFS TWICE A DAY. 30.6 g 5  . VIIBRYD 40 MG TABS TAKE 1 TABLET ONCE DAILY. 90 tablet 1  . warfarin (COUMADIN) 2.5 MG tablet Take 1 tablet daily except 2 tablets on Mon Wed and Fridays or Take as directed by anticoagulation clinic 40 tablet 3   No facility-administered medications prior to visit.    ROS Review of Systems  Constitutional: Positive for fatigue and unexpected weight change (wt gain). Negative for appetite change, chills and diaphoresis.  HENT: Negative.   Eyes: Negative.   Respiratory: Positive for choking. Negative for cough, chest tightness, shortness of breath, wheezing and stridor.   Cardiovascular: Positive for leg swelling. Negative for chest pain and palpitations.  Gastrointestinal: Negative for abdominal pain, blood in stool, constipation, diarrhea, nausea and vomiting.  Endocrine: Negative.   Genitourinary: Negative.  Negative for difficulty urinating.  Musculoskeletal:  Negative.  Negative for arthralgias and myalgias.  Skin: Negative.   Neurological: Negative.  Negative for dizziness, weakness, light-headedness and headaches.  Hematological: Negative for adenopathy. Does not bruise/bleed easily.  Psychiatric/Behavioral: Negative.     Objective:  BP (!) 148/92   Pulse 91   Temp 98.2 F (36.8 C) (Oral)   Ht 5\' 2"  (1.575 m)   Wt 175 lb (79.4 kg)   SpO2 95%   BMI 32.01 kg/m   BP Readings from Last 3  Encounters:  07/13/20 137/64  07/12/20 (!) 148/92  05/30/20 132/74    Wt Readings from Last 3 Encounters:  07/12/20 178 lb (80.7 kg)  07/12/20 175 lb (79.4 kg)  05/30/20 162 lb (73.5 kg)    Physical Exam Vitals reviewed.  HENT:     Nose: Nose normal.     Mouth/Throat:     Mouth: Mucous membranes are moist.  Eyes:     General: No scleral icterus.    Conjunctiva/sclera: Conjunctivae normal.  Cardiovascular:     Rate and Rhythm: Normal rate and regular rhythm.     Heart sounds: Murmur heard.   Systolic murmur is present with a grade of 2/6. Gallop present. S4 sounds present.   Pulmonary:     Effort: Pulmonary effort is normal.     Breath sounds: Examination of the right-lower field reveals rales. Examination of the left-lower field reveals rales. Rales present. No decreased breath sounds, wheezing or rhonchi.  Abdominal:     General: Abdomen is flat.     Palpations: There is no mass.     Tenderness: There is no abdominal tenderness.  Musculoskeletal:        General: Normal range of motion.     Cervical back: Neck supple.     Right lower leg: 3+ Edema present.     Left lower leg: 3+ Edema present.  Lymphadenopathy:     Cervical: No cervical adenopathy.  Skin:    General: Skin is warm and dry.     Findings: No bruising.  Neurological:     General: No focal deficit present.     Mental Status: She is alert and oriented to person, place, and time. Mental status is at baseline.  Psychiatric:        Mood and Affect: Mood normal.        Behavior: Behavior normal.     Lab Results  Component Value Date   WBC 5.1 07/12/2020   HGB 11.6 (L) 07/12/2020   HCT 34.0 (L) 07/12/2020   PLT 155 07/12/2020   GLUCOSE 125 (H) 07/12/2020   CHOL 128 07/12/2020   TRIG 53.0 07/12/2020   HDL 56.30 07/12/2020   LDLCALC 61 07/12/2020   ALT 10 07/12/2020   AST 19 07/12/2020   NA 141 07/12/2020   K 3.6 07/12/2020   CL 100 07/12/2020   CREATININE 1.20 (H) 07/12/2020   BUN 12  07/12/2020   CO2 30 07/12/2020   TSH 2.53 07/12/2020   INR 2.2 (H) 07/12/2020   HGBA1C 5.3 03/07/2020   MICROALBUR 63.9 (H) 04/20/2018    MM 3D SCREEN BREAST BILATERAL  Result Date: 05/03/2020 CLINICAL DATA:  Screening. EXAM: DIGITAL SCREENING BILATERAL MAMMOGRAM WITH TOMO AND CAD COMPARISON:  Previous exam(s). ACR Breast Density Category b: There are scattered areas of fibroglandular density. FINDINGS: There are no findings suspicious for malignancy. Images were processed with CAD. IMPRESSION: No mammographic evidence of malignancy. A result letter of this screening mammogram will be mailed directly to the patient.  RECOMMENDATION: Screening mammogram in one year. (Code:SM-B-01Y) BI-RADS CATEGORY  1: Negative. Electronically Signed   By: Ammie Ferrier M.D.   On: 05/03/2020 12:40    Assessment & Plan:   Holly Simmons was seen today for annual exam, hypertension, anemia and congestive heart failure.  Diagnoses and all orders for this visit:  Bilateral leg edema- Her renal function has improved.  Her thyroid function is normal.  She does not have enough protein in the urine to be considered for nephrotic syndrome.  Her BNP is elevated.  She has already anticoagulation so I doubt this is DVT.  I am concerned that the lower extremity edema is due to worsening diastolic dysfunction.  I recommended that she start taking a loop diuretic. -     Brain natriuretic peptide; Future -     TSH; Future -     Urinalysis, Routine w reflex microscopic; Future -     torsemide (DEMADEX) 20 MG tablet; Take 1 tablet (20 mg total) by mouth daily. -     TSH -     Brain natriuretic peptide -     Urinalysis, Routine w reflex microscopic  Chronic diastolic heart failure ([place])- See above. -     Brain natriuretic peptide; Future -     torsemide (DEMADEX) 20 MG tablet; Take 1 tablet (20 mg total) by mouth daily. -     Brain natriuretic peptide  CKD (chronic kidney disease) stage 5, GFR less than 15 ml/min (HCC)- Her  renal function has improved. -     Basic metabolic panel; Future -     Urinalysis, Routine w reflex microscopic; Future -     Basic metabolic panel -     Urinalysis, Routine w reflex microscopic  Iron deficiency anemia due to chronic blood loss- Her H&H have improved and her iron level is normal.  She likely has the anemia of chronic disease and renal insufficiency. -     CBC with Differential/Platelet; Future -     Iron; Future -     Ferritin; Future -     Ferritin -     Iron -     CBC with Differential/Platelet  Encounter for general adult medical examination with abnormal findings- Exam completed, labs reviewed, vaccines reviewed and updated, no cancer screenings are indicated, patient education material was given.  Pharyngeal dysphagia -     Ambulatory referral to Gastroenterology  Thrombocytopenia ([place])- Her platelet count is normal now.  Screening for B12 and folate deficiency is negative. -     CBC with Differential/Platelet; Future -     Folate; Future -     Vitamin B12; Future -     Folate -     CBC with Differential/Platelet -     Vitamin B12  Paroxysmal atrial fibrillation ([place])- She has good rate and rhythm control.  Will continue anticoagulation with Coumadin. -     TSH; Future -     Protime-INR; Future -     Cancel: Protime-INR -     TSH  Hyperlipidemia with target LDL less than 100- Statin therapy is not indicated. -     Lipid panel; Future -     TSH; Future -     Hepatic function panel; Future -     Hepatic function panel -     TSH -     Lipid panel  Long term (current) use of anticoagulants- Her INR is in the therapeutic range. -     Protime-INR; Future -  Cancel: Protime-INR   I am having Holly Simmons start on torsemide. I am also having her maintain her fexofenadine, B-complex with vitamin C, PRESCRIPTION MEDICATION, Betamethasone Valerate, modafinil, ipratropium, ketoconazole, Symbicort, ciclopirox, famotidine, allopurinol, Viibryd,  warfarin, metoprolol tartrate, buPROPion, lamoTRIgine, and dronabinol.  Meds ordered this encounter  Medications  . torsemide (DEMADEX) 20 MG tablet    Sig: Take 1 tablet (20 mg total) by mouth daily.    Dispense:  90 tablet    Refill:  0   In addition to time spent on CPE, I spent 50 minutes in preparing to see the patient by review of recent labs, imaging and procedures, obtaining and reviewing separately obtained history, communicating with the patient and family or caregiver, ordering medications, tests or procedures, and documenting clinical information in the EHR including the differential Dx, treatment, and any further evaluation and other management of 1. Bilateral leg edema 2. Chronic diastolic heart failure ([place]) 3. CKD (chronic kidney disease) stage 5, GFR less than 15 ml/min (HCC) 4. Iron deficiency anemia due to chronic blood loss 5. Pharyngeal dysphagia 6. Thrombocytopenia (HCC) 7. Paroxysmal atrial fibrillation ([place]) 6. Hyperlipidemia with target LDL less than 100 7. Long term (current) use of anticoagulants      Follow-up: Return in about 4 weeks (around 08/09/2020).  Scarlette Calico, MD

## 2020-07-12 NOTE — ED Provider Notes (Signed)
This patient is on warfarin, had a mechanical fall this evening landing flat on her face causing a nasal bone fracture and a laceration of the forehead.  This was expertly repaired by physician assistant [place], the patient looks well, there is no active bleeding in the nasal passages, she has no blood in the posterior pharynx and appears well.  Vital signs show minimal hypertension, no tachycardia, this patient is well-appearing for discharge, she can return if the bleeding worsens.  Will prescribe an antibiotic for these nasal bone fractures  Medical screening examination/treatment/procedure(s) were conducted as a shared visit with non-physician practitioner(s) and myself.  I personally evaluated the patient during the encounter.  Clinical Impression:   Final diagnoses:  Fall, initial encounter  Laceration of forehead, initial encounter  Closed fracture of nasal bone, initial encounter         Noemi Chapel, MD 07/13/20 [phone]

## 2020-07-12 NOTE — Progress Notes (Signed)
I have reviewed and agree.

## 2020-07-12 NOTE — ED Provider Notes (Signed)
[facility] EMERGENCY DEPARTMENT Provider Note   CSN: [id number] Arrival date & time: 07/12/20  2059     History Chief Complaint  Patient presents with  . Trauma  . Fall    Holly Simmons is a 79 y.o. female he resents for evaluation after a fall that occurred this prior to ED arrival. Patient was at [facility] care and was walking out. She reports that she took a diuretic right before leaving and she had to go to the bathroom. She was attempting to get to the bathroom but she did not make it. She states that she urinated on herself and slipped on the floor and fell face forward. She states she hit her face and head but does not think she lost consciousness. She is on Coumadin for A. fib. She states her last dose was earlier this evening. Patient has been ambulatory since this happened. She states her face hurts but nothing else. She denies any preceding chest pain or dizziness that caused her to fall. She denies any back pain, chest pain, difficulty breathing, abdominal pain, nausea/vomiting, numbness/weakness of arms or legs. She does not know when her last tetanus shot was but states she is allergic to them.  The history is provided by the patient.       History reviewed. No pertinent past medical history.  There are no problems to display for this patient.   History reviewed. No pertinent surgical history.   OB History   No obstetric history on file.     No family history on file.     Home Medications Prior to Admission medications   Not on File    Allergies    Patient has no allergy information on record.  Review of Systems   Review of Systems  HENT:       Facial pain  Eyes: Negative for visual disturbance.  Respiratory: Negative for cough and shortness of breath.   Cardiovascular: Negative for chest pain.  Gastrointestinal: Negative for abdominal pain, nausea and vomiting.  Skin: Positive for wound.  Neurological: Negative for  headaches.  All other systems reviewed and are negative.   Physical Exam Updated Vital Signs BP (!) 174/73   Pulse (!) 59   Temp (!) 97.5 F (36.4 C) (Oral)   Resp 13   Ht 5\' 3"  (1.6 m)   Wt 80.7 kg   SpO2 96%   BMI 31.53 kg/m   Physical Exam Vitals and nursing note reviewed.  Constitutional:      Appearance: Normal appearance. She is well-developed and well-nourished.  HENT:     Head: Normocephalic.      Comments: She has about a 2.5 cm lightning strike shaped laceration noted to the mid forehead. It does not involve the eyebrow. No underlying skull deformity or crepitus noted. She has tenderness palpation of the nasal bridge with overlying soft tissue swelling, ecchymosis. No septal hematoma noted. Tenderness palpation noted to the mandible.    Nose:     Comments: Small amount of blood trickling out from left nare.  No septal hematoma.    Mouth/Throat:     Mouth: Oropharynx is clear and moist and mucous membranes are normal.  Eyes:     General: Lids are normal.     Extraocular Movements: EOM normal.     Conjunctiva/sclera:     Right eye: Hemorrhage present.     Pupils: Pupils are equal, round, and reactive to light.     Comments: PERRL. EOMs intact.  No nystagmus. No neglect.  Subconjunctival hemorrhage noted to right eye, appears subacute.  Neck:     Comments: C-collar in place. Cardiovascular:     Rate and Rhythm: Normal rate and regular rhythm.     Pulses: Normal pulses.     Heart sounds: Normal heart sounds. No murmur heard. No friction rub. No gallop.   Pulmonary:     Effort: Pulmonary effort is normal.     Breath sounds: Normal breath sounds.     Comments: Lungs clear to auscultation bilaterally.  Symmetric chest rise.  No wheezing, rales, rhonchi. Chest:     Comments: No anterior chest wall tenderness.  No deformity or crepitus noted.  No evidence of flail chest. Abdominal:     Palpations: Abdomen is soft. Abdomen is not rigid.     Tenderness: There is no  abdominal tenderness. There is no guarding.     Comments: Abdomen is soft, non-distended, non-tender. No rigidity, No guarding. No peritoneal signs.  Musculoskeletal:        General: Normal range of motion.     Comments: No tenderness to palpation to bilateral shoulders, clavicles, elbows, and wrists. No deformities or crepitus noted. FROM of BUE without difficulty.  No tenderness to palpation to bilateral knees and ankles. No deformities or crepitus noted. FROM of BLE without any difficulty. No pelvic instability.  Skin:    General: Skin is warm and dry.     Capillary Refill: Capillary refill takes less than 2 seconds.  Neurological:     Mental Status: She is alert and oriented to person, place, and time.     Comments: Cranial nerves III-XII intact Follows commands, Moves all extremities  5/5 strength to BUE and BLE  Sensation intact throughout all major nerve distributions No slurred speech. No facial droop.   Psychiatric:        Mood and Affect: Mood and affect normal.        Speech: Speech normal.     ED Results / Procedures / Treatments   Labs (all labs ordered are listed, but only abnormal results are displayed) Labs Reviewed  COMPREHENSIVE METABOLIC PANEL - Abnormal; Notable for the following components:      Result Value   Glucose, Bld 132 (*)    Creatinine, Ser 1.26 (*)    GFR, Estimated 44 (*)    All other components within normal limits  CBC - Abnormal; Notable for the following components:   RBC 3.37 (*)    Hemoglobin 10.7 (*)    HCT 33.2 (*)    All other components within normal limits  PROTIME-INR - Abnormal; Notable for the following components:   Prothrombin Time 23.4 (*)    INR 2.2 (*)    All other components within normal limits  I-STAT CHEM 8, ED - Abnormal; Notable for the following components:   Creatinine, Ser 1.20 (*)    Glucose, Bld 125 (*)    Hemoglobin 11.6 (*)    HCT 34.0 (*)    All other components within normal limits  URINALYSIS, ROUTINE W  REFLEX MICROSCOPIC  ETHANOL    EKG None  Radiology CT Head Wo Contrast  Result Date: 07/12/2020 CLINICAL DATA:  Status post trauma. EXAM: CT HEAD WITHOUT CONTRAST TECHNIQUE: Contiguous axial images were obtained from the base of the skull through the vertex without intravenous contrast. COMPARISON:  August 11, 2019 FINDINGS: Brain: There is mild cerebral atrophy with widening of the extra-axial spaces and ventricular dilatation. There are areas of decreased attenuation  within the white matter tracts of the supratentorial brain, consistent with microvascular disease changes. Vascular: No hyperdense vessel or unexpected calcification. Skull: Bilateral mildly displaced nasal bone fractures are seen. Sinuses/Orbits: A small right maxillary sinus air-fluid level is noted. There is moderate to marked severity nasal mucosal thickening. A mildly displaced fracture of the nasal septum is present. Other: Mild frontal scalp soft tissue swelling is seen along the midline. Moderate severity bilateral paranasal soft tissue swelling is also seen. IMPRESSION: 1. Bilateral mildly displaced nasal bone fractures with associated soft tissue swelling and an additional mildly displaced fracture of the nasal septum. 2. Generalized cerebral atrophy without acute intracranial abnormality. Electronically Signed   By: Virgina Norfolk M.D.   On: 07/12/2020 21:52   CT Cervical Spine Wo Contrast  Result Date: 07/12/2020 CLINICAL DATA:  Status post trauma. EXAM: CT CERVICAL SPINE WITHOUT CONTRAST TECHNIQUE: Multidetector CT imaging of the cervical spine was performed without intravenous contrast. Multiplanar CT image reconstructions were also generated. COMPARISON:  None. FINDINGS: Alignment: There is approximately 1 mm to 2 mm retrolisthesis of the C5 vertebral body on C6. Skull base and vertebrae: No acute fracture. No primary bone lesion or focal pathologic process. Soft tissues and spinal canal: No prevertebral fluid or  swelling. No visible canal hematoma. Disc levels: Moderate severity endplate sclerosis and marked severity osteophyte formation is seen at the level of C5-C6. Mild to moderate severity osteophyte formation is noted at the level of C4-C5. Moderate to marked severity intervertebral disc space narrowing is present at the level of C5-C6. Mild to moderate severity bilateral multilevel facet joint hypertrophy is noted. Upper chest: Negative. Other: None. IMPRESSION: 1. Multilevel degenerative changes, most prominent at the level of C5-C6. 2. No acute fracture of the cervical spine. Electronically Signed   By: Virgina Norfolk M.D.   On: 07/12/2020 21:56   DG Pelvis Portable  Result Date: 07/12/2020 CLINICAL DATA:  Acute pain due to trauma. EXAM: PORTABLE PELVIS 1-2 VIEWS COMPARISON:  None. FINDINGS: There are degenerative changes of both hips. There is no definite acute displaced fracture or dislocation. Vascular calcifications are noted. IMPRESSION: Negative. Electronically Signed   By: Constance Holster M.D.   On: 07/12/2020 21:44   DG Chest Portable 1 View  Result Date: 07/12/2020 CLINICAL DATA:  Status post trauma. EXAM: PORTABLE CHEST 1 VIEW COMPARISON:  October 18, 2019 FINDINGS: Multiple sternal wires are seen. A multi lead AICD is present. There is no evidence of acute infiltrate, pleural effusion or pneumothorax. The cardiac silhouette is markedly enlarged. The visualized skeletal structures are unremarkable. IMPRESSION: Stable cardiomegaly without evidence of acute or active cardiopulmonary disease. Electronically Signed   By: Virgina Norfolk M.D.   On: 07/12/2020 21:47   CT Maxillofacial Wo Contrast  Result Date: 07/12/2020 CLINICAL DATA:  Status post trauma. EXAM: CT MAXILLOFACIAL WITHOUT CONTRAST TECHNIQUE: Multidetector CT imaging of the maxillofacial structures was performed. Multiplanar CT image reconstructions were also generated. COMPARISON:  None. FINDINGS: Osseous: Bilateral mildly  displaced nasal bone fractures are seen. An additional mildly displaced fracture of the nasal septum is noted. Orbits: Negative. No traumatic or inflammatory finding. Sinuses: There is a small, mildly hyperdense right maxillary sinus air-fluid level. Marked severity nasal mucosal thickening is seen. Soft tissues: Mild frontal scalp soft tissue swelling is seen. Mild to moderate severity bilateral paranasal soft tissue swelling is also noted. Limited intracranial: No significant or unexpected finding. IMPRESSION: 1. Bilateral mildly displaced nasal bone fractures with associated paranasal soft tissue swelling and a mildly  displaced fracture of the nasal septum. 2. Small, mildly hyperdense right maxillary sinus air-fluid level. This may represent a small amount of blood products. Electronically Signed   By: Virgina Norfolk M.D.   On: 07/12/2020 21:59    Procedures .Marland KitchenLaceration Repair  Date/Time: 07/12/2020 11:03 PM Performed by: Volanda Napoleon, PA-C Authorized by: Volanda Napoleon, PA-C   Consent:    Consent obtained:  Verbal   Consent given by:  Patient   Risks discussed:  Infection, need for additional repair, pain, poor cosmetic result and poor wound healing   Alternatives discussed:  No treatment and delayed treatment Universal protocol:    Procedure explained and questions answered to patient or proxy's satisfaction: yes     Relevant documents present and verified: yes     Test results available: yes     Imaging studies available: yes     Required blood products, implants, devices, and special equipment available: yes     Site/side marked: yes     Immediately prior to procedure, a time out was called: yes     Patient identity confirmed:  Verbally with patient Anesthesia:    Anesthesia method:  Local infiltration Laceration details:    Location:  Scalp   Scalp location:  Frontal   Length (cm):  2.5 Pre-procedure details:    Preparation:  Patient was prepped and draped in usual  sterile fashion and imaging obtained to evaluate for foreign bodies Exploration:    Hemostasis achieved with:  Direct pressure   Wound extent: no foreign bodies/material noted   Treatment:    Area cleansed with:  Povidone-iodine   Amount of cleaning:  Extensive   Irrigation solution:  Sterile saline   Irrigation method:  Syringe   Visualized foreign bodies/material removed: no     Debridement:  Minimal Skin repair:    Repair method:  Sutures   Suture size:  6-0   Suture material:  Nylon   Suture technique:  Simple interrupted   Number of sutures:  7 Approximation:    Approximation:  Close Repair type:    Repair type:  Intermediate   (including critical care time)  Medications Ordered in ED Medications  bacitracin ointment (has no administration in time range)  cephALEXin (KEFLEX) capsule 500 mg (has no administration in time range)  lidocaine-EPINEPHrine (XYLOCAINE W/EPI) 1 %-1:100000 (with pres) injection 10 mL (10 mLs Intradermal Given 07/12/20 2159)  oxyCODONE-acetaminophen (PERCOCET/ROXICET) 5-325 MG per tablet 1 tablet (1 tablet Oral Given 07/12/20 2245)    ED Course  I have reviewed the triage vital signs and the nursing notes.  Pertinent labs & imaging results that were available during my care of the patient were reviewed by me and considered in my medical decision making (see chart for details).    MDM Rules/Calculators/A&P                          79 year old female brought in by EMS for evaluation of mechanical fall that occurred just prior to arrival. Patient reports that she took a diuretic at a doctor's office and states she had a to the bathroom and she was trying to make it when she slipped and fell, causing her to fall face forward. She had her head. No LOC. She is on Coumadin. She has a laceration noted to her forehead. He is not complaining of any other pain. No preceding chest pain or dizziness. On initial arrival she is afebrile, nontoxic-appearing. Vital  signs  are stable. She has a laceration noted for forehead. C-collar in place. Given that she is on Coumadin and hit her head, will plan for trauma scan of her head, neck as well as maxillofacial. Will plan for plain films of chest and pelvis. Patient does not know when her last tetanus shot was but she states she is allergic to them.  I did discuss with her regarding tetanus for lacerations.  Patient states she does not want a tdap.  INR is 2.2.  CMP shows normal BUN and creatinine.  CBC shows no leukocytosis.  Hemoglobin is stable.  Chest x-ray is negative for any acute abnormality.  Pelvis x-ray is negative for any acute abnormality.  CT head shows no acute intracranial abnormality.  CT C-spine negative.  CT maxillofacial shows bilateral mildly displaced nasal bone fractures with associated soft tissue swelling and additional mildly displaced fracture of the nasal septum.  Laceration repaired as documented above.  Patient tolerated procedure well.  She did start have a mild nosebleed in the left nare.  The area was packed with cotton balls soaked with lidocaine.  After removal of the cotton balls, the area was not bleeding.  Reevaluation.  No active bleeding noted. No indication for packing.  At this time, patient patient stable for discharge home.  We will plan to treat her nasal fracture with antibiotics to prevent infection as well as pain control.  Patient instructed to follow-up with ENT.  She has seen Dr. Janace Hoard in the past.  Encouraged at home supportive care measures. At this time, patient exhibits no emergent life-threatening condition that require further evaluation in ED. Patient had ample opportunity for questions and discussion. All patient's questions were answered with full understanding. Strict return precautions discussed. Patient expresses understanding and agreement to plan.   Portions of this note were generated with Lobby[employer]. Dictation errors may occur despite best  attempts at proofreading.  Final Clinical Impression(s) / ED Diagnoses Final diagnoses:  Fall, initial encounter  Laceration of forehead, initial encounter  Closed fracture of nasal bone, initial encounter    Rx / DC Orders ED Discharge Orders    None       Desma Mcgregor 07/13/20 1325    Noemi Chapel, MD 07/13/20 [phone]

## 2020-07-13 ENCOUNTER — Encounter: Payer: Self-pay | Admitting: Internal Medicine

## 2020-07-13 LAB — HEPATIC FUNCTION PANEL
ALT: 7 U/L (ref 0–35)
AST: 16 U/L (ref 0–37)
Albumin: 3.8 g/dL (ref 3.5–5.2)
Alkaline Phosphatase: 95 U/L (ref 39–117)
Bilirubin, Direct: 0.2 mg/dL (ref 0.0–0.3)
Total Bilirubin: 0.8 mg/dL (ref 0.2–1.2)
Total Protein: 6.6 g/dL (ref 6.0–8.3)

## 2020-07-13 LAB — BASIC METABOLIC PANEL
BUN: 13 mg/dL (ref 6–23)
CO2: 33 mEq/L — ABNORMAL HIGH (ref 19–32)
Calcium: 9.5 mg/dL (ref 8.4–10.5)
Chloride: 105 mEq/L (ref 96–112)
Creatinine, Ser: 1.26 mg/dL — ABNORMAL HIGH (ref 0.40–1.20)
GFR: 41.33 mL/min — ABNORMAL LOW (ref >60.00)
Glucose, Bld: 109 mg/dL — ABNORMAL HIGH (ref 70–99)
Potassium: 4.3 mEq/L (ref 3.5–5.1)
Sodium: 142 mEq/L (ref 135–145)

## 2020-07-13 LAB — CBC WITH DIFFERENTIAL/PLATELET
Basophils Absolute: 0 10*3/uL (ref 0.0–0.1)
Basophils Relative: 0.6 % (ref 0.0–3.0)
Eosinophils Absolute: 0.2 10*3/uL (ref 0.0–0.7)
Eosinophils Relative: 3.8 % (ref 0.0–5.0)
HCT: 34 % — ABNORMAL LOW (ref 36.0–46.0)
Hemoglobin: 11.1 g/dL — ABNORMAL LOW (ref 12.0–15.0)
Lymphocytes Relative: 11.1 % — ABNORMAL LOW (ref 12.0–46.0)
Lymphs Abs: 0.5 10*3/uL — ABNORMAL LOW (ref 0.7–4.0)
MCHC: 32.8 g/dL (ref 30.0–36.0)
MCV: 95.2 fl (ref 78.0–100.0)
Monocytes Absolute: 0.6 10*3/uL (ref 0.1–1.0)
Monocytes Relative: 14.2 % — ABNORMAL HIGH (ref 3.0–12.0)
Neutro Abs: 3.2 10*3/uL (ref 1.4–7.7)
Neutrophils Relative %: 70.3 % (ref 43.0–77.0)
Platelets: 139 10*3/uL — ABNORMAL LOW (ref 150.0–400.0)
RBC: 3.57 Mil/uL — ABNORMAL LOW (ref 3.87–5.11)
RDW: 15.6 % — ABNORMAL HIGH (ref 11.5–15.5)
WBC: 4.5 10*3/uL (ref 4.0–10.5)

## 2020-07-13 LAB — URINALYSIS, ROUTINE W REFLEX MICROSCOPIC
Bilirubin Urine: NEGATIVE
Hgb urine dipstick: NEGATIVE
Ketones, ur: NEGATIVE
Leukocytes,Ua: NEGATIVE
Nitrite: NEGATIVE
Specific Gravity, Urine: >=1.03 — AB (ref 1.000–1.030)
Total Protein, Urine: 100 — AB
Urine Glucose: NEGATIVE
Urobilinogen, UA: 0.2 (ref 0.0–1.0)
pH: 6 (ref 5.0–8.0)

## 2020-07-13 LAB — FERRITIN: Ferritin: 61.8 ng/mL (ref 10.0–291.0)

## 2020-07-13 LAB — LIPID PANEL
Cholesterol: 128 mg/dL (ref 0–200)
HDL: 56.3 mg/dL (ref >39.00)
LDL Cholesterol: 61 mg/dL (ref 0–99)
NonHDL: 71.41
Total CHOL/HDL Ratio: 2
Triglycerides: 53 mg/dL (ref 0.0–149.0)
VLDL: 10.6 mg/dL (ref 0.0–40.0)

## 2020-07-13 LAB — FOLATE: Folate: >23.6 ng/mL (ref >5.9)

## 2020-07-13 LAB — VITAMIN B12: Vitamin B-12: 1314 pg/mL — ABNORMAL HIGH (ref 211–911)

## 2020-07-13 LAB — IRON: Iron: 43 ug/dL (ref 42–145)

## 2020-07-13 LAB — TSH: TSH: 2.53 u[IU]/mL (ref 0.35–4.50)

## 2020-07-13 LAB — BRAIN NATRIURETIC PEPTIDE: Pro B Natriuretic peptide (BNP): 539 pg/mL — ABNORMAL HIGH (ref 0.0–100.0)

## 2020-07-13 NOTE — ED Notes (Signed)
Patient verbalizes understanding of discharge instructions. Opportunity for questioning and answers were provided. Armband removed by staff, pt discharged from ED in wheelchair to home.   

## 2020-07-13 NOTE — ED Notes (Signed)
Patient transported to CT with CT Technician

## 2020-07-16 ENCOUNTER — Other Ambulatory Visit: Payer: Self-pay | Admitting: Internal Medicine

## 2020-07-18 ENCOUNTER — Other Ambulatory Visit: Payer: Self-pay

## 2020-07-18 ENCOUNTER — Encounter (HOSPITAL_COMMUNITY): Payer: Self-pay | Admitting: Emergency Medicine

## 2020-07-18 ENCOUNTER — Emergency Department (HOSPITAL_COMMUNITY)
Admission: EM | Admit: 2020-07-18 | Discharge: 2020-07-18 | Disposition: A | Discharge status: Home / Self Care | Payer: Medicare PPO | Attending: Emergency Medicine | Admitting: Emergency Medicine

## 2020-07-18 DIAGNOSIS — I5032 Chronic diastolic (congestive) heart failure: Secondary | ICD-10-CM | POA: Insufficient documentation

## 2020-07-18 DIAGNOSIS — J45909 Unspecified asthma, uncomplicated: Secondary | ICD-10-CM | POA: Diagnosis not present

## 2020-07-18 DIAGNOSIS — R040 Epistaxis: Secondary | ICD-10-CM | POA: Diagnosis not present

## 2020-07-18 DIAGNOSIS — Z4802 Encounter for removal of sutures: Secondary | ICD-10-CM | POA: Diagnosis not present

## 2020-07-18 DIAGNOSIS — N185 Chronic kidney disease, stage 5: Secondary | ICD-10-CM | POA: Insufficient documentation

## 2020-07-18 DIAGNOSIS — E1129 Type 2 diabetes mellitus with other diabetic kidney complication: Secondary | ICD-10-CM | POA: Diagnosis not present

## 2020-07-18 DIAGNOSIS — R519 Headache, unspecified: Secondary | ICD-10-CM | POA: Insufficient documentation

## 2020-07-18 DIAGNOSIS — Z7951 Long term (current) use of inhaled steroids: Secondary | ICD-10-CM | POA: Diagnosis not present

## 2020-07-18 DIAGNOSIS — J329 Chronic sinusitis, unspecified: Secondary | ICD-10-CM | POA: Insufficient documentation

## 2020-07-18 DIAGNOSIS — Z950 Presence of cardiac pacemaker: Secondary | ICD-10-CM | POA: Insufficient documentation

## 2020-07-18 DIAGNOSIS — Z7901 Long term (current) use of anticoagulants: Secondary | ICD-10-CM | POA: Insufficient documentation

## 2020-07-18 DIAGNOSIS — Z79899 Other long term (current) drug therapy: Secondary | ICD-10-CM | POA: Insufficient documentation

## 2020-07-18 DIAGNOSIS — I10 Essential (primary) hypertension: Secondary | ICD-10-CM | POA: Diagnosis not present

## 2020-07-18 DIAGNOSIS — I132 Hypertensive heart and chronic kidney disease with heart failure and with stage 5 chronic kidney disease, or end stage renal disease: Secondary | ICD-10-CM | POA: Diagnosis not present

## 2020-07-18 DIAGNOSIS — R58 Hemorrhage, not elsewhere classified: Secondary | ICD-10-CM | POA: Diagnosis not present

## 2020-07-18 DIAGNOSIS — R0902 Hypoxemia: Secondary | ICD-10-CM | POA: Diagnosis not present

## 2020-07-18 DIAGNOSIS — W19XXXA Unspecified fall, initial encounter: Secondary | ICD-10-CM | POA: Diagnosis not present

## 2020-07-18 LAB — PROTIME-INR
INR: 2.4 — ABNORMAL HIGH (ref 0.8–1.2)
Prothrombin Time: 25.3 seconds — ABNORMAL HIGH (ref 11.4–15.2)

## 2020-07-18 MED ORDER — OXYMETAZOLINE HCL 0.05 % NA SOLN
1.0000 | Freq: Once | NASAL | Status: AC
  Administered 2020-07-18: 1 via NASAL
  Filled 2020-07-18: qty 30

## 2020-07-18 NOTE — ED Provider Notes (Signed)
[facility] DEPT Provider Note   CSN: [id number] Arrival date & time: 07/18/20  0755     History Chief Complaint  Patient presents with  . Epistaxis    Holly Simmons is a 79 y.o. female.  Patient is a 79 year old female with a history of CKD, COPD, diabetes, diastolic dysfunction, a flutter with pacemaker and valve replacement on Coumadin who presents today with nosebleed.  Patient had a fall on 07/12/2020 where she fell face first onto the pavement.  She had multiple nasal bone fractures and reports significant nosebleed during the trauma.  However since that time over the last week she has had some mild nosebleeds here and there but woke up this morning with profuse bleeding.  She felt like it was coming out both sides.  She denies feeling lightheaded or short of breath.  She currently feels like there is no bleeding at this moment.  She has not been using any nasal sprays.  She is having pain in her face from the fractures but otherwise has no new symptoms.  The history is provided by the patient and the EMS personnel.  Epistaxis Location:  Bilateral Severity:  Moderate Duration:  1 hour Timing:  Constant Progression:  Resolved Chronicity:  Recurrent Context: anticoagulants and trauma   Relieved by:  Applying pressure Worsened by:  Nothing Ineffective treatments:  None tried Associated symptoms: facial pain and sinus pain   Associated symptoms: no dizziness, no fever and no headaches        Past Medical History:  Diagnosis Date  . Allergic rhinitis   . Asthma    NOS w/ acute exacerbation  . Blood transfusion without reported diagnosis   . CKD (chronic kidney disease) stage 3, GFR 30-59 ml/min ([place]) 09/12/2018  . Colon polyps    TUBULAR ADENOMAS AND HYPERPLASTIC  . Complete heart block (HCC)    requiring PPM (MDT) post surgical myomectomy at [facility],  leads are epicardial with abdominal implant, high ventricular threshold at implant  .  COPD (chronic obstructive pulmonary disease) ([place])   . Depressive disorder   . Diastolic dysfunction   . DM (diabetes mellitus) ([place])   . Gallstones   . GERD (gastroesophageal reflux disease)   . Gout 08/20/2012  . Heart murmur   . Hyperpotassemia   . Hypersomnia   . Hypertension   . Hypertrophic cardiomyopathy ([place])    s/p surgical myomectomy at [facility] 40/97 complicated by septal VSD post procedure requiring reoperation with patch closure and tricuspid valve replacement  . Kidney stones   . Myocardial infarction ([place]) 2011  . Obstructive sleep apnea    persistent daytime sleepiness despite cpap  . 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 Psoriasis   . Pyuria   . Renal insufficiency   . 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 Typical atrial flutter ([place]) 9/15    Patient Active Problem List   Diagnosis Date Noted  . Bilateral leg edema 07/12/2020  . Encounter for general adult medical examination with abnormal findings 07/12/2020  . Pharyngeal dysphagia 07/12/2020  . Hypercalcemia 10/08/2019  . Cachexia ([place]) 08/16/2019  . Iron deficiency anemia due to chronic blood loss 09/23/2018  . CKD (chronic kidney disease) stage 5, GFR less than 15 ml/min (HCC) 09/12/2018  . Microalbuminuria due to type 2 diabetes mellitus ([place]) 04/21/2018  . Obesity (BMI 30.0-34.9) 02/05/2016  . Osteoporosis with pathological fracture of ankle and foot 05/30/2015  . Encounter for therapeutic drug monitoring 11/22/2014  .  Atrial flutter ([place]) 05/08/2014  . Complete heart block ([place]) 07/29/2013  . Gout 08/20/2012  . 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 Other screening mammogram 02/04/2012  . Anemia, chronic disease 12/04/2011  . Hyperlipidemia with target LDL less than 100 12/04/2011  . 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 Type 2 diabetes mellitus with complication, without long-term current use of insulin ([place]) 08/25/2011  . 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  Long term (current) use of anticoagulants 07/18/2011  . Atrial fibrillation ([place]) 07/18/2011  . Routine general medical examination at a health care facility 05/15/2011  . Hypertrophic obstructive cardiomyopathy ([place]) 05/22/2010  . Hypersomnia 08/16/2009  . Depression, major, in partial remission ([place]) 11/28/2008  . Chronic diastolic heart failure ([place]) 08/25/2008  . COPD (chronic obstructive pulmonary disease) with acute bronchitis ([place]) 08/10/2008  . GERD 08/10/2008  . Obstructive sleep apnea 08/10/2007  . Allergic rhinitis 08/09/2007    Past Surgical History:  Procedure Laterality Date  . ABDOMINAL HYSTERECTOMY    . APPENDECTOMY    . BREAST CYST EXCISION    . CARDIOVERSION N/A 08/01/2014   Procedure: CARDIOVERSION;  Surgeon: Thayer Headings, MD;  Location: [facility];  Service: Cardiovascular;  Laterality: N/A;  . CESAREAN SECTION    . CHOLECYSTECTOMY    . COLONOSCOPY    . EYE SURGERY  [id number]   left eye lens implant  . EYE SURGERY  [id number]   right eye lens implant  . INSERT / REPLACE / REMOVE PACEMAKER  07/13/2012  . PACEMAKER INSERTION  07/02/11   epicardial wires with abdominal implant at [facility] 12/12,  high ventricular lead threshold at implant per Dr Westley Gambles  . PERMANENT PACEMAKER INSERTION N/A 07/13/2012   Procedure: PERMANENT PACEMAKER INSERTION;  Surgeon: Thompson Grayer, MD;  Location: [facility] CATH LAB;  Service: Cardiovascular;  Laterality: N/A;  . POLYPECTOMY    . septal myomectomy for hypertrophic CM  06/23/11   by Dr Evelina Dun at [facility], complicated by septal VSD requiring patch repair and tricuspid valve replacement  . TONSILLECTOMY    . TRICUSPID VALVE REPLACEMENT  11/12   Medtronic 36mm Mosaic tissue valve  . VSD REPAIR  06/2011     OB History   No obstetric history on file.     Family History  Problem Relation Age of Onset  . Hypertension Daughter   . Heart disease Maternal Grandfather   . Heart disease Paternal Grandfather   . Bladder Cancer Father   .  Prostate cancer Father   . Cancer Father   . Varicose Veins Father   . Heart attack Father   . Breast cancer Mother   . Dementia Mother   . Hypertension Mother   . Cancer Mother   . Ovarian cancer Maternal Aunt   . Pancreatic cancer Cousin   . Breast cancer Paternal Aunt   . Dementia Maternal Aunt        x 2  . Dementia Maternal Uncle        x 2  . Colon cancer Neg Hx     Social History   Tobacco Use  . Smoking status: Never Smoker  . Smokeless tobacco: Never Used  Vaping Use  . Vaping Use: Never used  Substance Use Topics  . Alcohol use: No    Alcohol/week: 0.0 standard drinks  . Drug use: No    Home Medications Prior to Admission medications   Medication Sig Start Date End Date Taking? Authorizing Provider  allopurinol (ZYLOPRIM) 100 MG tablet TAKE  1 TABLET BY MOUTH DAILY. 03/14/20   Janith Lima, MD  B Complex-C (B-COMPLEX WITH VITAMIN C) tablet Take 1 tablet by mouth daily.    [provider]  Betamethasone Valerate 0.12 % foam APPLY TO SCALP QD Patient taking differently: Apply 1 application topically once a week. Apply to scalp. 09/30/17   Janith Lima, MD  buPROPion (WELLBUTRIN XL) 300 MG 24 hr tablet TAKE 1 TABLET ONCE DAILY. 05/21/20   Janith Lima, MD  cephALEXin (KEFLEX) 500 MG capsule Take 1 capsule (500 mg total) by mouth 4 (four) times daily for 7 days. 07/12/20 07/19/20  Volanda Napoleon, PA-C  ciclopirox (LOPROX) 0.77 % SUSP APPLY 1 PUMP TWICE A DAY. 11/16/19   Janith Lima, MD  dronabinol (MARINOL) 5 MG capsule TAKE 1 CAPSULE TWICE DAILY BEFORE LUNCH AND SUPPER. 06/18/20   Janith Lima, MD  famotidine (PEPCID) 40 MG tablet TAKE 1 TABLET ONCE DAILY. 03/14/20   Janith Lima, MD  fexofenadine (ALLEGRA) 180 MG tablet Take 180 mg by mouth daily as needed for allergies.    [provider]  HYDROcodone-acetaminophen (NORCO/VICODIN) 5-325 MG tablet Take 1-2 tablets by mouth every 6 (six) hours as needed. 07/12/20   Providence Lanius A,  PA-C  ipratropium (ATROVENT) 0.03 % nasal spray USE 2 SPRAYS IN EACH NOSTRIL 3 TIMES A DAY AS NEEDED. 04/29/18   [name], MD  ketoconazole (NIZORAL) 2 % cream APPLY TO AFFECTED AREA TWICE A DAY. 08/17/19   Janith Lima, MD  lamoTRIgine (LAMICTAL) 25 MG tablet TAKE 1 TABLET ONCE DAILY. 05/21/20   Janith Lima, MD  metoprolol tartrate (LOPRESSOR) 25 MG tablet TAKE 1 TABLET BY MOUTH TWICE DAILY. 07/16/20   Janith Lima, MD  modafinil (PROVIGIL) 100 MG tablet Take 1 tablet (100 mg total) by mouth daily. Patient taking differently: Take 100 mg by mouth daily as needed. 03/29/18   [name], MD  PRESCRIPTION MEDICATION CPAP    [provider]  SYMBICORT 80-4.5 MCG/ACT inhaler USE 2 PUFFS TWICE A DAY. 09/19/19   Janith Lima, MD  torsemide (DEMADEX) 20 MG tablet Take 1 tablet (20 mg total) by mouth daily. 07/12/20   Janith Lima, MD  VIIBRYD 40 MG TABS TAKE 1 TABLET ONCE DAILY. 03/14/20   Janith Lima, MD  warfarin (COUMADIN) 2.5 MG tablet Take 1 tablet daily except 2 tablets on Mon Wed and Fridays or Take as directed by anticoagulation clinic 04/12/20   Janith Lima, MD  Alum & Mag Hydroxide-Simeth (MAGIC MOUTHWASH) SOLN Take 5 mLs by mouth 3 (three) times daily. 14 days then stop   05/19/19  [provider]    Allergies    Petrolatum-zinc oxide; Sulfa antibiotics; Sulfonamide derivatives; Tetanus toxoid; Tetanus toxoids; Other; Nsaids; and Tetanus toxoid, adsorbed  Review of Systems   Review of Systems  Constitutional: Negative for fever.  HENT: Positive for nosebleeds and sinus pain.   Neurological: Negative for dizziness and headaches.  All other systems reviewed and are negative.   Physical Exam Updated Vital Signs There were no vitals taken for this visit.  Physical Exam Vitals and nursing note reviewed.  Constitutional:      General: She is not in acute distress.    Appearance: Normal appearance. She is well-developed, normal weight and  well-nourished.  HENT:     Head: Normocephalic.     Comments: Significant ecchymosis covering the entire face that is in various stages of healing.  Healing  wound across the glabella with sutures intact    Nose:     Comments: Mild deviation of the septum to the right.  Right nare without any sign of clot recurrent bleeding.  Blood clot present in the left nare.    Mouth/Throat:     Comments: Small clot noted in the posterior pharynx with no active bleeding Eyes:     Extraocular Movements: EOM normal.     Pupils: Pupils are equal, round, and reactive to light.  Cardiovascular:     Rate and Rhythm: Normal rate and regular rhythm.     Pulses: Intact distal pulses.     Heart sounds: Normal heart sounds. No murmur heard. No friction rub.  Pulmonary:     Effort: Pulmonary effort is normal.     Breath sounds: Normal breath sounds. No wheezing or rales.  Musculoskeletal:        General: No tenderness. Normal range of motion.     Comments: No edema  Skin:    General: Skin is warm and dry.     Findings: No rash.  Neurological:     General: No focal deficit present.     Mental Status: She is alert and oriented to person, place, and time. Mental status is at baseline.     Cranial Nerves: No cranial nerve deficit.  Psychiatric:        Mood and Affect: Mood and affect and mood normal.        Behavior: Behavior normal.     ED Results / Procedures / Treatments   Labs (all labs ordered are listed, but only abnormal results are displayed) Labs Reviewed  PROTIME-INR - Abnormal; Notable for the following components:      Result Value   Prothrombin Time 25.3 (*)    INR 2.4 (*)    All other components within normal limits    EKG None  Radiology No results found.  Procedures .Suture Removal  Date/Time: 07/18/2020 9:37 AM Performed by: Blanchie Dessert, MD Authorized by: Blanchie Dessert, MD   Consent:    Consent obtained:  Verbal   Consent given by:  Patient   Risks  discussed:  Pain   Alternatives discussed:  No treatment Universal protocol:    Procedure explained and questions answered to patient or proxy's satisfaction: yes   Procedure details:    Wound appearance:  No signs of infection, good wound healing and clean   Number of sutures removed:  6 Post-procedure details:    Post-removal:  No dressing applied   Procedure completion:  Tolerated   (including critical care time)  Medications Ordered in ED Medications  oxymetazoline (AFRIN) 0.05 % nasal spray 1 spray (has no administration in time range)    ED Course  I have reviewed the triage vital signs and the nursing notes.  Pertinent labs & imaging results that were available during my care of the patient were reviewed by me and considered in my medical decision making (see chart for details).    MDM Rules/Calculators/A&P                          Elderly female presenting today with epistaxis.  This is most likely from trauma as patient broke her nose last week after a fall.  She does have bilateral nasal bone fractures with mild displacement.  On exam nosebleed appears to be from the left nare where there is a clot present at this time and no active bleeding.  Patient is on Coumadin and last INR last week was 2.2.  Will recheck to ensure that it is not supratherapeutic at this time.  Will attempt to remove clot and apply Afrin.  Will monitor the patient.  9:36 AM INR is therapeutic at 2.4.  After removal of clot and placing Afrin patient has had no further bleeding in greater than 1 hour.  Patient was able to stand and felt okay.  Feel that patient is stable for discharge.  Planning on following up with ENT tomorrow.  MDM Number of Diagnoses or Management Options   Amount and/or Complexity of Data Reviewed Clinical lab tests: ordered and reviewed Independent visualization of images, tracings, or specimens: yes     Final Clinical Impression(s) / ED Diagnoses Final diagnoses:   Left-sided epistaxis  Visit for suture removal    Rx / DC Orders ED Discharge Orders    None       Blanchie Dessert, MD 07/18/20 [phone]

## 2020-07-18 NOTE — ED Triage Notes (Signed)
Patient BIBA from home for epistaxis that started this morning. Patient fell ambulating on 12/9. Patient reports facial trauma. EMS states bleeding is controlled at this time. Patient reporting nausea.   Hx A. Fib, MI, heart valve replacement    BP 148/66 P 80 RR 16 SpO2 95% RA CBG 111 T 98.1

## 2020-07-18 NOTE — Discharge Instructions (Addendum)
Use the Afrin 2 times a day in both nostrils to try to prevent further bleeding.  Follow up with ENT tomorrow as planned.

## 2020-07-19 DIAGNOSIS — S022XXD Fracture of nasal bones, subsequent encounter for fracture with routine healing: Secondary | ICD-10-CM | POA: Diagnosis not present

## 2020-07-19 DIAGNOSIS — Z7901 Long term (current) use of anticoagulants: Secondary | ICD-10-CM | POA: Diagnosis not present

## 2020-07-19 DIAGNOSIS — R040 Epistaxis: Secondary | ICD-10-CM | POA: Diagnosis not present

## 2020-07-19 DIAGNOSIS — S022XXA Fracture of nasal bones, initial encounter for closed fracture: Secondary | ICD-10-CM | POA: Insufficient documentation

## 2020-07-20 NOTE — Progress Notes (Signed)
Remote pacemaker transmission.   

## 2020-07-31 DIAGNOSIS — Z809 Family history of malignant neoplasm, unspecified: Secondary | ICD-10-CM | POA: Diagnosis not present

## 2020-07-31 DIAGNOSIS — R457 State of emotional shock and stress, unspecified: Secondary | ICD-10-CM | POA: Diagnosis not present

## 2020-07-31 DIAGNOSIS — I480 Paroxysmal atrial fibrillation: Secondary | ICD-10-CM | POA: Diagnosis not present

## 2020-07-31 DIAGNOSIS — I10 Essential (primary) hypertension: Secondary | ICD-10-CM | POA: Diagnosis not present

## 2020-07-31 DIAGNOSIS — E1122 Type 2 diabetes mellitus with diabetic chronic kidney disease: Secondary | ICD-10-CM | POA: Diagnosis not present

## 2020-07-31 DIAGNOSIS — K219 Gastro-esophageal reflux disease without esophagitis: Secondary | ICD-10-CM | POA: Diagnosis not present

## 2020-07-31 DIAGNOSIS — Z8249 Family history of ischemic heart disease and other diseases of the circulatory system: Secondary | ICD-10-CM | POA: Diagnosis not present

## 2020-07-31 DIAGNOSIS — D62 Acute posthemorrhagic anemia: Secondary | ICD-10-CM | POA: Diagnosis not present

## 2020-07-31 DIAGNOSIS — Z7901 Long term (current) use of anticoagulants: Secondary | ICD-10-CM | POA: Diagnosis not present

## 2020-07-31 DIAGNOSIS — R040 Epistaxis: Secondary | ICD-10-CM | POA: Diagnosis not present

## 2020-07-31 DIAGNOSIS — I129 Hypertensive chronic kidney disease with stage 1 through stage 4 chronic kidney disease, or unspecified chronic kidney disease: Secondary | ICD-10-CM | POA: Diagnosis not present

## 2020-07-31 DIAGNOSIS — Z952 Presence of prosthetic heart valve: Secondary | ICD-10-CM | POA: Diagnosis not present

## 2020-07-31 DIAGNOSIS — S022XXA Fracture of nasal bones, initial encounter for closed fracture: Secondary | ICD-10-CM | POA: Diagnosis not present

## 2020-07-31 DIAGNOSIS — R58 Hemorrhage, not elsewhere classified: Secondary | ICD-10-CM | POA: Diagnosis not present

## 2020-07-31 DIAGNOSIS — N183 Chronic kidney disease, stage 3 unspecified: Secondary | ICD-10-CM | POA: Diagnosis not present

## 2020-07-31 DIAGNOSIS — I959 Hypotension, unspecified: Secondary | ICD-10-CM | POA: Diagnosis not present

## 2020-08-06 ENCOUNTER — Other Ambulatory Visit (INDEPENDENT_AMBULATORY_CARE_PROVIDER_SITE_OTHER): Payer: Medicare PPO

## 2020-08-06 DIAGNOSIS — R040 Epistaxis: Secondary | ICD-10-CM | POA: Diagnosis not present

## 2020-08-06 DIAGNOSIS — Z7901 Long term (current) use of anticoagulants: Secondary | ICD-10-CM | POA: Diagnosis not present

## 2020-08-06 LAB — PROTIME-INR
INR: 1.3 ratio — ABNORMAL HIGH (ref 0.8–1.0)
Prothrombin Time: 14.8 s — ABNORMAL HIGH (ref 9.6–13.1)

## 2020-08-08 DIAGNOSIS — I442 Atrioventricular block, complete: Secondary | ICD-10-CM | POA: Diagnosis not present

## 2020-08-08 DIAGNOSIS — S022XXD Fracture of nasal bones, subsequent encounter for fracture with routine healing: Secondary | ICD-10-CM | POA: Diagnosis not present

## 2020-08-08 DIAGNOSIS — N183 Chronic kidney disease, stage 3 unspecified: Secondary | ICD-10-CM | POA: Diagnosis not present

## 2020-08-08 DIAGNOSIS — M1030 Gout due to renal impairment, unspecified site: Secondary | ICD-10-CM | POA: Diagnosis not present

## 2020-08-08 DIAGNOSIS — I129 Hypertensive chronic kidney disease with stage 1 through stage 4 chronic kidney disease, or unspecified chronic kidney disease: Secondary | ICD-10-CM | POA: Diagnosis not present

## 2020-08-08 DIAGNOSIS — I480 Paroxysmal atrial fibrillation: Secondary | ICD-10-CM | POA: Diagnosis not present

## 2020-08-08 DIAGNOSIS — E1122 Type 2 diabetes mellitus with diabetic chronic kidney disease: Secondary | ICD-10-CM | POA: Diagnosis not present

## 2020-08-08 DIAGNOSIS — D62 Acute posthemorrhagic anemia: Secondary | ICD-10-CM | POA: Diagnosis not present

## 2020-08-08 DIAGNOSIS — D631 Anemia in chronic kidney disease: Secondary | ICD-10-CM | POA: Diagnosis not present

## 2020-08-09 ENCOUNTER — Ambulatory Visit: Payer: Medicare PPO | Admitting: Internal Medicine

## 2020-08-09 ENCOUNTER — Encounter: Payer: Self-pay | Admitting: Internal Medicine

## 2020-08-09 ENCOUNTER — Other Ambulatory Visit: Payer: Self-pay

## 2020-08-09 VITALS — BP 136/78 | HR 88 | Temp 98.5°F | Resp 16 | Ht 63.0 in | Wt 143.0 lb

## 2020-08-09 DIAGNOSIS — D631 Anemia in chronic kidney disease: Secondary | ICD-10-CM | POA: Diagnosis not present

## 2020-08-09 DIAGNOSIS — I480 Paroxysmal atrial fibrillation: Secondary | ICD-10-CM | POA: Diagnosis not present

## 2020-08-09 DIAGNOSIS — E1122 Type 2 diabetes mellitus with diabetic chronic kidney disease: Secondary | ICD-10-CM | POA: Diagnosis not present

## 2020-08-09 DIAGNOSIS — D62 Acute posthemorrhagic anemia: Secondary | ICD-10-CM | POA: Diagnosis not present

## 2020-08-09 DIAGNOSIS — N183 Chronic kidney disease, stage 3 unspecified: Secondary | ICD-10-CM | POA: Diagnosis not present

## 2020-08-09 DIAGNOSIS — S022XXD Fracture of nasal bones, subsequent encounter for fracture with routine healing: Secondary | ICD-10-CM | POA: Diagnosis not present

## 2020-08-09 DIAGNOSIS — Z7901 Long term (current) use of anticoagulants: Secondary | ICD-10-CM

## 2020-08-09 DIAGNOSIS — D500 Iron deficiency anemia secondary to blood loss (chronic): Secondary | ICD-10-CM

## 2020-08-09 DIAGNOSIS — M1030 Gout due to renal impairment, unspecified site: Secondary | ICD-10-CM | POA: Diagnosis not present

## 2020-08-09 DIAGNOSIS — I129 Hypertensive chronic kidney disease with stage 1 through stage 4 chronic kidney disease, or unspecified chronic kidney disease: Secondary | ICD-10-CM | POA: Diagnosis not present

## 2020-08-09 DIAGNOSIS — I442 Atrioventricular block, complete: Secondary | ICD-10-CM | POA: Diagnosis not present

## 2020-08-09 NOTE — Progress Notes (Unsigned)
Subjective:  Patient ID: Holly Simmons, female    DOB: 22-Feb-1941  Age: 80 y.o. MRN: [id number]  CC: Anemia and Atrial Fibrillation  This visit occurred during the SARS-CoV-2 public health emergency.  Safety protocols were in place, including screening questions prior to the visit, additional usage of staff PPE, and extensive cleaning of exam room while observing appropriate contact time as indicated for disinfecting solutions.    HPI Holly Simmons presents for f/up - She recently held her Coumadin because she had facial trauma complicated by epistaxis.  All of the bleeding has resolved.  Her an INR was 1.3 about 2 days ago.  She is restarted warfarin.  She is at her baseline today with no new or worsening complaints.  Outpatient Medications Prior to Visit  Medication Sig Dispense Refill  . allopurinol (ZYLOPRIM) 100 MG tablet TAKE 1 TABLET BY MOUTH DAILY. 90 tablet 1  . B Complex-C (B-COMPLEX WITH VITAMIN C) tablet Take 1 tablet by mouth daily.    . Betamethasone Valerate 0.12 % foam APPLY TO SCALP QD (Patient taking differently: Apply 1 application topically once a week. Apply to scalp.) 100 g 2  . buPROPion (WELLBUTRIN XL) 300 MG 24 hr tablet TAKE 1 TABLET ONCE DAILY. 90 tablet 0  . ciclopirox (LOPROX) 0.77 % SUSP APPLY 1 PUMP TWICE A DAY. 60 mL 0  . dronabinol (MARINOL) 5 MG capsule TAKE 1 CAPSULE TWICE DAILY BEFORE LUNCH AND SUPPER. 180 capsule 0  . famotidine (PEPCID) 40 MG tablet TAKE 1 TABLET ONCE DAILY. 90 tablet 1  . fexofenadine (ALLEGRA) 180 MG tablet Take 180 mg by mouth daily as needed for allergies.    Marland Kitchen ipratropium (ATROVENT) 0.03 % nasal spray USE 2 SPRAYS IN EACH NOSTRIL 3 TIMES A DAY AS NEEDED. 30 mL 0  . ketoconazole (NIZORAL) 2 % cream APPLY TO AFFECTED AREA TWICE A DAY. 60 g 0  . lamoTRIgine (LAMICTAL) 25 MG tablet TAKE 1 TABLET ONCE DAILY. 90 tablet 0  . metoprolol tartrate (LOPRESSOR) 25 MG tablet TAKE 1 TABLET BY MOUTH TWICE DAILY. 180 tablet 0  .  modafinil (PROVIGIL) 100 MG tablet Take 1 tablet (100 mg total) by mouth daily. (Patient taking differently: Take 100 mg by mouth daily as needed.) 30 tablet 5  . PRESCRIPTION MEDICATION CPAP    . SYMBICORT 80-4.5 MCG/ACT inhaler USE 2 PUFFS TWICE A DAY. 30.6 g 5  . torsemide (DEMADEX) 20 MG tablet Take 1 tablet (20 mg total) by mouth daily. 90 tablet 0  . VIIBRYD 40 MG TABS TAKE 1 TABLET ONCE DAILY. 90 tablet 1  . warfarin (COUMADIN) 2.5 MG tablet Take 1 tablet daily except 2 tablets on Mon Wed and Fridays or Take as directed by anticoagulation clinic 40 tablet 3  . HYDROcodone-acetaminophen (NORCO/VICODIN) 5-325 MG tablet Take 1-2 tablets by mouth every 6 (six) hours as needed. 8 tablet 0   No facility-administered medications prior to visit.    ROS Review of Systems  Constitutional: Positive for fatigue. Negative for appetite change, diaphoresis and unexpected weight change.  HENT: Negative.   Eyes: Negative for visual disturbance.  Respiratory: Positive for shortness of breath. Negative for chest tightness and wheezing.   Cardiovascular: Positive for leg swelling. Negative for chest pain and palpitations.  Gastrointestinal: Negative for abdominal pain, blood in stool, constipation, diarrhea, nausea and vomiting.  Endocrine: Negative.   Genitourinary: Negative.  Negative for difficulty urinating.  Musculoskeletal: Negative.   Skin: Negative.  Negative for color change.  Neurological: Positive for weakness. Negative for dizziness and light-headedness.       Falls  Hematological: Negative for adenopathy. Does not bruise/bleed easily.  Psychiatric/Behavioral: Negative.     Objective:  BP 136/78   Pulse 88   Temp 98.5 F (36.9 C) (Oral)   Resp 16   Ht 5\' 3"  (1.6 m)   Wt 143 lb (64.9 kg)   SpO2 92%   BMI 25.33 kg/m   BP Readings from Last 3 Encounters:  08/09/20 136/78  07/18/20 110/86  07/13/20 137/64    Wt Readings from Last 3 Encounters:  08/09/20 143 lb (64.9 kg)   07/12/20 178 lb (80.7 kg)  07/12/20 175 lb (79.4 kg)    Physical Exam Vitals reviewed.  HENT:     Nose: Nose normal.     Mouth/Throat:     Mouth: Mucous membranes are moist.  Eyes:     General: No scleral icterus.    Conjunctiva/sclera: Conjunctivae normal.  Cardiovascular:     Rate and Rhythm: Normal rate and regular rhythm.     Heart sounds: Murmur heard.   Systolic murmur is present with a grade of 1/6. Gallop present.   Pulmonary:     Effort: Pulmonary effort is normal.     Breath sounds: No stridor. No wheezing, rhonchi or rales.  Abdominal:     General: Abdomen is flat.     Palpations: There is no mass.     Tenderness: There is no abdominal tenderness. There is no guarding.  Musculoskeletal:     Cervical back: Neck supple.     Right lower leg: Edema (trace pitting) present.     Left lower leg: Edema (trac pitting) present.  Lymphadenopathy:     Cervical: No cervical adenopathy.  Skin:    General: Skin is warm and dry.     Coloration: Skin is pale.  Neurological:     General: No focal deficit present.     Mental Status: She is alert.  Psychiatric:        Mood and Affect: Mood normal.     Lab Results  Component Value Date   WBC 5.1 07/12/2020   HGB 11.6 (L) 07/12/2020   HCT 34.0 (L) 07/12/2020   PLT 155 07/12/2020   GLUCOSE 125 (H) 07/12/2020   CHOL 128 07/12/2020   TRIG 53.0 07/12/2020   HDL 56.30 07/12/2020   LDLCALC 61 07/12/2020   ALT 10 07/12/2020   AST 19 07/12/2020   NA 141 07/12/2020   K 3.6 07/12/2020   CL 100 07/12/2020   CREATININE 1.20 (H) 07/12/2020   BUN 12 07/12/2020   CO2 30 07/12/2020   TSH 2.53 07/12/2020   INR 1.3 (H) 08/06/2020   HGBA1C 5.3 03/07/2020   MICROALBUR 63.9 (H) 04/20/2018    No results found.  Assessment & Plan:   [place] was seen today for anemia and atrial fibrillation.  Diagnoses and all orders for this visit:  Paroxysmal atrial fibrillation ([place])- She has good rate and rhythm control.  She will restart  Coumadin for stroke prevention.  Iron deficiency anemia due to chronic blood loss- Recent improvement noted.  She will return in 2 to 3 months to recheck her H&H and iron level.  Long term (current) use of anticoagulants- Her recent INR was subtherapeutic.  She will restart Coumadin.   I have discontinued Taylie L. Berens's HYDROcodone-acetaminophen. I am also having her maintain her fexofenadine, B-complex with vitamin C, PRESCRIPTION MEDICATION, Betamethasone Valerate, modafinil, ipratropium, ketoconazole, Symbicort, ciclopirox,  famotidine, allopurinol, Viibryd, warfarin, buPROPion, lamoTRIgine, dronabinol, torsemide, and metoprolol tartrate.  No orders of the defined types were placed in this encounter.    Follow-up: No follow-ups on file.  Scarlette Calico, MD

## 2020-08-10 ENCOUNTER — Telehealth: Payer: Self-pay | Admitting: Internal Medicine

## 2020-08-10 DIAGNOSIS — D62 Acute posthemorrhagic anemia: Secondary | ICD-10-CM | POA: Diagnosis not present

## 2020-08-10 DIAGNOSIS — D631 Anemia in chronic kidney disease: Secondary | ICD-10-CM | POA: Diagnosis not present

## 2020-08-10 DIAGNOSIS — M1030 Gout due to renal impairment, unspecified site: Secondary | ICD-10-CM | POA: Diagnosis not present

## 2020-08-10 DIAGNOSIS — S022XXD Fracture of nasal bones, subsequent encounter for fracture with routine healing: Secondary | ICD-10-CM | POA: Diagnosis not present

## 2020-08-10 DIAGNOSIS — N183 Chronic kidney disease, stage 3 unspecified: Secondary | ICD-10-CM | POA: Diagnosis not present

## 2020-08-10 DIAGNOSIS — I442 Atrioventricular block, complete: Secondary | ICD-10-CM | POA: Diagnosis not present

## 2020-08-10 DIAGNOSIS — I129 Hypertensive chronic kidney disease with stage 1 through stage 4 chronic kidney disease, or unspecified chronic kidney disease: Secondary | ICD-10-CM | POA: Diagnosis not present

## 2020-08-10 DIAGNOSIS — E1122 Type 2 diabetes mellitus with diabetic chronic kidney disease: Secondary | ICD-10-CM | POA: Diagnosis not present

## 2020-08-10 DIAGNOSIS — I480 Paroxysmal atrial fibrillation: Secondary | ICD-10-CM | POA: Diagnosis not present

## 2020-08-10 NOTE — Telephone Encounter (Signed)
[place] Name: [place] Agency Name: Santina Evans Phone #: [phone] Service Requested: OT Frequency of Visits: 1W2, 1EOW1, 1W1

## 2020-08-10 NOTE — Telephone Encounter (Signed)
Verbal orders given  

## 2020-08-13 DIAGNOSIS — D631 Anemia in chronic kidney disease: Secondary | ICD-10-CM | POA: Diagnosis not present

## 2020-08-13 DIAGNOSIS — S022XXD Fracture of nasal bones, subsequent encounter for fracture with routine healing: Secondary | ICD-10-CM | POA: Diagnosis not present

## 2020-08-13 DIAGNOSIS — M1030 Gout due to renal impairment, unspecified site: Secondary | ICD-10-CM | POA: Diagnosis not present

## 2020-08-13 DIAGNOSIS — I442 Atrioventricular block, complete: Secondary | ICD-10-CM | POA: Diagnosis not present

## 2020-08-13 DIAGNOSIS — D62 Acute posthemorrhagic anemia: Secondary | ICD-10-CM | POA: Diagnosis not present

## 2020-08-13 DIAGNOSIS — I129 Hypertensive chronic kidney disease with stage 1 through stage 4 chronic kidney disease, or unspecified chronic kidney disease: Secondary | ICD-10-CM | POA: Diagnosis not present

## 2020-08-13 DIAGNOSIS — I480 Paroxysmal atrial fibrillation: Secondary | ICD-10-CM | POA: Diagnosis not present

## 2020-08-13 DIAGNOSIS — N183 Chronic kidney disease, stage 3 unspecified: Secondary | ICD-10-CM | POA: Diagnosis not present

## 2020-08-13 DIAGNOSIS — E1122 Type 2 diabetes mellitus with diabetic chronic kidney disease: Secondary | ICD-10-CM | POA: Diagnosis not present

## 2020-08-16 ENCOUNTER — Other Ambulatory Visit: Payer: Self-pay | Admitting: Internal Medicine

## 2020-08-16 DIAGNOSIS — M1030 Gout due to renal impairment, unspecified site: Secondary | ICD-10-CM | POA: Diagnosis not present

## 2020-08-16 DIAGNOSIS — I129 Hypertensive chronic kidney disease with stage 1 through stage 4 chronic kidney disease, or unspecified chronic kidney disease: Secondary | ICD-10-CM | POA: Diagnosis not present

## 2020-08-16 DIAGNOSIS — I442 Atrioventricular block, complete: Secondary | ICD-10-CM | POA: Diagnosis not present

## 2020-08-16 DIAGNOSIS — E1122 Type 2 diabetes mellitus with diabetic chronic kidney disease: Secondary | ICD-10-CM | POA: Diagnosis not present

## 2020-08-16 DIAGNOSIS — S022XXD Fracture of nasal bones, subsequent encounter for fracture with routine healing: Secondary | ICD-10-CM | POA: Diagnosis not present

## 2020-08-16 DIAGNOSIS — F418 Other specified anxiety disorders: Secondary | ICD-10-CM

## 2020-08-16 DIAGNOSIS — D631 Anemia in chronic kidney disease: Secondary | ICD-10-CM | POA: Diagnosis not present

## 2020-08-16 DIAGNOSIS — F3341 Major depressive disorder, recurrent, in partial remission: Secondary | ICD-10-CM

## 2020-08-16 DIAGNOSIS — D62 Acute posthemorrhagic anemia: Secondary | ICD-10-CM | POA: Diagnosis not present

## 2020-08-16 DIAGNOSIS — Z7901 Long term (current) use of anticoagulants: Secondary | ICD-10-CM

## 2020-08-16 DIAGNOSIS — N183 Chronic kidney disease, stage 3 unspecified: Secondary | ICD-10-CM | POA: Diagnosis not present

## 2020-08-16 DIAGNOSIS — I480 Paroxysmal atrial fibrillation: Secondary | ICD-10-CM | POA: Diagnosis not present

## 2020-08-17 ENCOUNTER — Ambulatory Visit (INDEPENDENT_AMBULATORY_CARE_PROVIDER_SITE_OTHER): Payer: Medicare PPO | Admitting: Pulmonary Disease

## 2020-08-17 ENCOUNTER — Other Ambulatory Visit: Payer: Self-pay

## 2020-08-17 ENCOUNTER — Encounter: Payer: Self-pay | Admitting: Pulmonary Disease

## 2020-08-17 VITALS — BP 134/62 | HR 109 | Temp 97.9°F | Ht 63.0 in | Wt 138.8 lb

## 2020-08-17 DIAGNOSIS — Z9989 Dependence on other enabling machines and devices: Secondary | ICD-10-CM

## 2020-08-17 DIAGNOSIS — G4710 Hypersomnia, unspecified: Secondary | ICD-10-CM

## 2020-08-17 DIAGNOSIS — G4733 Obstructive sleep apnea (adult) (pediatric): Secondary | ICD-10-CM

## 2020-08-17 NOTE — Patient Instructions (Signed)
Will have [employer] arrange for CPAP mask refitting  Follow up in 6 months

## 2020-08-17 NOTE — Progress Notes (Signed)
Pulmonary, Critical Care, and Sleep Medicine  Chief Complaint  Patient presents with  . Follow-up    Using cpap most of the time.  Fall on 07/12/20, sleeping some in the recliner.  Leaking around nasal mask.    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  Brief Summary:  Holly Simmons is a 80 y.o. female with obstructive sleep apnea with persistent daytime hypersomnolence.      Subjective:   I last saw her in August 2019.  She fell recently and broke her nose.  Has been difficult to use CPAP as a result.  Has full face mask.  Hasn't tried any other types of masks.  She uses provigil intermittently, but hasn't needed to use recently.  Physical Exam:   Appearance - well kempt   ENMT - no sinus tenderness, no oral exudate, no LAN, Mallampati 4 airway, no stridor  Respiratory - equal breath sounds bilaterally, no wheezing or rales  CV - s1s2 regular rate and rhythm, no murmurs  Ext - no clubbing, no edema  Skin - no rashes  Psych - normal mood and affect   Sleep Tests:   PSG 06/05/07 >> AHI 6, SpO2 low  77%  HST 05/15/15 >> AHI 29, SaO2 low 70%  Auto CPAP 02/27/18 to 03/28/18 >> used on 20 to 30 nights with average 6 hrs 10 min.  Average AHI 0.7 with median CPAP 7 and 95 th percentile CPAP 10 cm H2O  Cardiac Tests:   Echo 05/24/20 >> EF 50%, RVSP 42.2 mmHg, mod MR, mild MS, bioprosthetic TV, mild AR, mild AS, severe LA dilation  Social History:  She  reports that she has never smoked. She has never used smokeless tobacco. She reports that she does not drink alcohol and does not use drugs.  Family History:  Her family history includes Bladder Cancer in her father; Breast cancer in her mother and paternal aunt; Cancer in her father and mother; Dementia in her maternal aunt, maternal uncle, and mother; Heart attack in her father; Heart disease in her maternal grandfather and paternal grandfather; Hypertension in her daughter and mother; Ovarian cancer in her maternal aunt; Pancreatic cancer in her cousin; Prostate cancer in her father; Varicose Veins in her father.     Assessment/Plan:   Obstructive sleep apnea. - she is compliant with CPAP and reports benefit - she uses [employer] for her DME - will arrange for CPAP mask refitting - continue auto CPAP  Hypersomnia with driving in setting of obstructive sleep apnea. 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 - followed by Dr. Jenkins Rouge with [facility]  Time Spent Involved in  Patient Care on Day of Examination:  24 minutes  Follow up:  Patient Instructions  Will have [employer] arrange for CPAP mask refitting  Follow up in 6 months   Medication List:   Allergies as of 08/17/2020      Reactions   Petrolatum-zinc Oxide Anaphylaxis, Rash, Swelling   Sulfa Antibiotics Rash   Sulfonamide Derivatives Anaphylaxis, Swelling   Tetanus Toxoid    Other reaction(s): Other (See Comments) OTHER REACTION   Tetanus Toxoids Swelling   Other Rash, Other (See Comments)   STERI  - STRIPS  It can affect proteins in her kidneys so she doesn't take REACTION: proteinuria Other reaction(s): Other (See Comments), Unknown It can affect proteins in her kidneys so she doesn't take   Nsaids Other (See Comments)   REACTION: proteinuria Other reaction(s): Other (See Comments), Unknown It can affect proteins in her kidneys so she doesn't take   Tetanus Toxoid, Adsorbed Other (See Comments)   Other reaction(s): Other (See Comments) Turned arm red Turned arm red      Medication List       Accurate as of August 17, 2020  4:04 PM. If you have any questions, ask your nurse or doctor.        allopurinol 100 MG tablet Commonly known as: ZYLOPRIM TAKE 1 TABLET BY MOUTH DAILY.   B-complex with vitamin C tablet Take 1 tablet by mouth daily.   Betamethasone Valerate 0.12 % foam APPLY TO SCALP QD What changed:   how much to take  how to take this  when to take this  additional instructions   buPROPion 300 MG 24 hr tablet Commonly known as: WELLBUTRIN XL TAKE 1 TABLET ONCE DAILY.   ciclopirox 0.77 % Susp Commonly known as: LOPROX APPLY 1 PUMP TWICE A DAY.   dronabinol 5 MG capsule Commonly known as: MARINOL TAKE 1 CAPSULE TWICE DAILY BEFORE LUNCH AND SUPPER.   famotidine 40 MG tablet Commonly known as: PEPCID TAKE 1 TABLET ONCE DAILY.   fexofenadine 180 MG tablet Commonly known as: ALLEGRA Take 180 mg by mouth daily as needed for allergies.   ipratropium 0.03 % nasal spray Commonly known as: ATROVENT USE 2 SPRAYS IN EACH NOSTRIL 3 TIMES A DAY AS NEEDED.   ketoconazole 2 % cream Commonly known as: NIZORAL APPLY TO AFFECTED AREA TWICE A DAY.   lamoTRIgine 25 MG tablet Commonly known as: LAMICTAL TAKE 1 TABLET ONCE DAILY.   metoprolol tartrate 25 MG tablet Commonly known as: LOPRESSOR TAKE 1 TABLET BY MOUTH TWICE DAILY.   modafinil 100 MG tablet Commonly known as: PROVIGIL Take 1 tablet (100 mg total) by mouth daily. What changed:   when  to take this  reasons to take this   PRESCRIPTION MEDICATION CPAP   Symbicort 80-4.5 MCG/ACT inhaler Generic drug: budesonide-formoterol USE 2 PUFFS TWICE A DAY.   torsemide 20 MG tablet Commonly known as: DEMADEX Take 1 tablet (20 mg total) by mouth daily.   Viibryd 40 MG Tabs Generic drug: Vilazodone HCl TAKE 1 TABLET ONCE DAILY.   warfarin 2.5 MG tablet Commonly known as: COUMADIN Take as directed by the anticoagulation clinic. If you are unsure how to take this medication, talk to your nurse or doctor. Original instructions: TAKE 1 TABLET DAILY EXCEPT 2 TABLETS ON MONDAY, WEDNESDAY, AND FRIDAYS       Signature:  [name], MD [place] Pager - [phone] 08/17/2020, 4:04 PM

## 2020-08-21 ENCOUNTER — Ambulatory Visit: Payer: Medicare PPO

## 2020-08-22 DIAGNOSIS — Z952 Presence of prosthetic heart valve: Secondary | ICD-10-CM

## 2020-08-22 DIAGNOSIS — Z7951 Long term (current) use of inhaled steroids: Secondary | ICD-10-CM

## 2020-08-22 DIAGNOSIS — S022XXD Fracture of nasal bones, subsequent encounter for fracture with routine healing: Secondary | ICD-10-CM | POA: Diagnosis not present

## 2020-08-22 DIAGNOSIS — I442 Atrioventricular block, complete: Secondary | ICD-10-CM | POA: Diagnosis not present

## 2020-08-22 DIAGNOSIS — K219 Gastro-esophageal reflux disease without esophagitis: Secondary | ICD-10-CM

## 2020-08-22 DIAGNOSIS — E1122 Type 2 diabetes mellitus with diabetic chronic kidney disease: Secondary | ICD-10-CM | POA: Diagnosis not present

## 2020-08-22 DIAGNOSIS — Z7901 Long term (current) use of anticoagulants: Secondary | ICD-10-CM

## 2020-08-22 DIAGNOSIS — M1030 Gout due to renal impairment, unspecified site: Secondary | ICD-10-CM | POA: Diagnosis not present

## 2020-08-22 DIAGNOSIS — Z79899 Other long term (current) drug therapy: Secondary | ICD-10-CM

## 2020-08-22 DIAGNOSIS — D62 Acute posthemorrhagic anemia: Secondary | ICD-10-CM | POA: Diagnosis not present

## 2020-08-22 DIAGNOSIS — D631 Anemia in chronic kidney disease: Secondary | ICD-10-CM | POA: Diagnosis not present

## 2020-08-22 DIAGNOSIS — N183 Chronic kidney disease, stage 3 unspecified: Secondary | ICD-10-CM | POA: Diagnosis not present

## 2020-08-22 DIAGNOSIS — I480 Paroxysmal atrial fibrillation: Secondary | ICD-10-CM | POA: Diagnosis not present

## 2020-08-22 DIAGNOSIS — Z950 Presence of cardiac pacemaker: Secondary | ICD-10-CM

## 2020-08-22 DIAGNOSIS — Z9181 History of falling: Secondary | ICD-10-CM

## 2020-08-22 DIAGNOSIS — I129 Hypertensive chronic kidney disease with stage 1 through stage 4 chronic kidney disease, or unspecified chronic kidney disease: Secondary | ICD-10-CM | POA: Diagnosis not present

## 2020-08-23 ENCOUNTER — Telehealth: Payer: Self-pay

## 2020-08-23 ENCOUNTER — Encounter: Payer: Self-pay | Admitting: Nurse Practitioner

## 2020-08-23 ENCOUNTER — Ambulatory Visit: Payer: Medicare PPO | Admitting: General Practice

## 2020-08-23 ENCOUNTER — Other Ambulatory Visit: Payer: Self-pay

## 2020-08-23 ENCOUNTER — Ambulatory Visit: Payer: Medicare PPO

## 2020-08-23 ENCOUNTER — Other Ambulatory Visit (INDEPENDENT_AMBULATORY_CARE_PROVIDER_SITE_OTHER): Payer: Medicare PPO

## 2020-08-23 ENCOUNTER — Ambulatory Visit: Payer: Medicare PPO | Admitting: Nurse Practitioner

## 2020-08-23 VITALS — BP 118/62 | HR 70 | Ht 63.0 in | Wt 137.0 lb

## 2020-08-23 DIAGNOSIS — K219 Gastro-esophageal reflux disease without esophagitis: Secondary | ICD-10-CM

## 2020-08-23 DIAGNOSIS — Z950 Presence of cardiac pacemaker: Secondary | ICD-10-CM

## 2020-08-23 DIAGNOSIS — R1310 Dysphagia, unspecified: Secondary | ICD-10-CM

## 2020-08-23 DIAGNOSIS — R112 Nausea with vomiting, unspecified: Secondary | ICD-10-CM

## 2020-08-23 DIAGNOSIS — Z7901 Long term (current) use of anticoagulants: Secondary | ICD-10-CM | POA: Diagnosis not present

## 2020-08-23 DIAGNOSIS — I483 Typical atrial flutter: Secondary | ICD-10-CM

## 2020-08-23 LAB — POCT INR: INR: 2.7 (ref 2.0–3.0)

## 2020-08-23 LAB — CBC
HCT: 30.1 % — ABNORMAL LOW (ref 36.0–46.0)
Hemoglobin: 9.8 g/dL — ABNORMAL LOW (ref 12.0–15.0)
MCHC: 32.6 g/dL (ref 30.0–36.0)
MCV: 94.7 fl (ref 78.0–100.0)
Platelets: 149 10*3/uL — ABNORMAL LOW (ref 150.0–400.0)
RBC: 3.18 Mil/uL — ABNORMAL LOW (ref 3.87–5.11)
RDW: 16.3 % — ABNORMAL HIGH (ref 11.5–15.5)
WBC: 5.6 10*3/uL (ref 4.0–10.5)

## 2020-08-23 LAB — COMPREHENSIVE METABOLIC PANEL
ALT: 8 U/L (ref 0–35)
AST: 14 U/L (ref 0–37)
Albumin: 3.9 g/dL (ref 3.5–5.2)
Alkaline Phosphatase: 127 U/L — ABNORMAL HIGH (ref 39–117)
BUN: 27 mg/dL — ABNORMAL HIGH (ref 6–23)
CO2: 34 mEq/L — ABNORMAL HIGH (ref 19–32)
Calcium: 10 mg/dL (ref 8.4–10.5)
Chloride: 102 mEq/L (ref 96–112)
Creatinine, Ser: 1.55 mg/dL — ABNORMAL HIGH (ref 0.40–1.20)
GFR: 32.21 mL/min — ABNORMAL LOW (ref >60.00)
Glucose, Bld: 111 mg/dL — ABNORMAL HIGH (ref 70–99)
Potassium: 3.9 mEq/L (ref 3.5–5.1)
Sodium: 138 mEq/L (ref 135–145)
Total Bilirubin: 0.7 mg/dL (ref 0.2–1.2)
Total Protein: 6.8 g/dL (ref 6.0–8.3)

## 2020-08-23 LAB — LIPASE: Lipase: 12 U/L (ref 11.0–59.0)

## 2020-08-23 NOTE — Telephone Encounter (Signed)
Request for surgical clearance:     Endoscopy Procedure  What type of surgery is being performed?     EGD  When is this surgery scheduled?     09/14/20  What type of clearance is required ?   Pharmacy  Are there any medications that need to be held prior to surgery and how long? Coumadin 3-5 day hold  Practice name and name of physician performing surgery?      [facility]  What is your office phone and fax number?      Phone- [phone]  [phone]  Anesthesia type (None, local, MAC, general) ?       MAC

## 2020-08-23 NOTE — Telephone Encounter (Signed)
Patient with diagnosis of afib/flutter on warfarin for anticoagulation.  Also has HOCM, tricuspid valve replaced with bioprosthetic valve.  Procedure: endoscopy Date of procedure: 09/14/20  CHA2DS2-VASc Score = 7  This indicates a 11.2% annual risk of stroke. The patient's score is based upon: CHF History: Yes HTN History: Yes Diabetes History: Yes Stroke History: No Vascular Disease History: Yes Age Score: 2 Gender Score: 1  CrCl 29.24mL/min based on SCr checked today  Platelet count 149K  Per office protocol, patient can hold warfarin for 5 days prior to procedure. Patient will need bridging with Lovenox around procedure.  Will forward to MD for input regarding Lovenox dosing frequency. Pt is right at the cutoff for needing Lovenox 1mg /kg once daily vs twice daily. SCr has fluctuated in the past, more recently improved to 1.26 but historically > 2. Would have a slight preference for once daily dosing due to this (also had a fall and recurrent nose bleeds 07/2020).

## 2020-08-23 NOTE — Patient Instructions (Addendum)
If you are age 78 or older, your body mass index should be between 23-30. Your Body mass index is 24.27 kg/m. If this is out of the aforementioned range listed, please consider follow up with your Primary Care Provider.  If you are age 37 or younger, your body mass index should be between 19-25. Your Body mass index is 24.27 kg/m. If this is out of the aformentioned range listed, please consider follow up with your Primary Care Provider.   You have been scheduled for an endoscopy. Please follow written instructions given to you at your visit today. If you use inhalers (even only as needed), please bring them with you on the day of your procedure.  You will be contacted by our office prior to your procedure for directions on holding your Coumadin.  If you do not hear from our office 1 week prior to your scheduled procedure, please call [phone] to discuss.   LABS: Your provider has requested that you go to the basement level for lab work before leaving today. Press "B" on the elevator. The lab is located at the first door on the left as you exit the elevator.  HEALTHCARE LAWS AND MY CHART RESULTS: Due to recent changes in healthcare laws, you may see the results of your imaging and laboratory studies on MyChart before your provider has had a chance to review them.  We understand that in some cases there may be results that are confusing or concerning to you. Not all laboratory results come back in the same time frame and the provider may be waiting for multiple results in order to interpret others.  Please give [place] 48 hours in order for your provider to thoroughly review all the results before contacting the office for clarification of your results.   Avoid eating 3-4 hours before bedtime. Sleep with the head of the bed elevated.  Continue taking Famotidine once daily.  Please call our office is your symptoms worsen.  It was great seeing you today!  Thank you for entrusting me with your  care and choosing [facility].  Noralyn Pick, CRNP

## 2020-08-23 NOTE — Patient Instructions (Addendum)
Pre visit review using our clinic review tool, if applicable. No additional management support is needed unless otherwise documented below in the visit note.  Continue to take 1 tablet daily except take 2 tablets on Mondays and Thursdays.    Re-check in 4 weeks

## 2020-08-23 NOTE — Progress Notes (Signed)
I have reviewed and agree.

## 2020-08-23 NOTE — Telephone Encounter (Signed)
-----   Message from Janith Lima, MD sent at 08/23/2020  4:38 PM EST ----- Regarding: FW: Lovenox bridge Yes   ----- Message ----- From: Warden Fillers, RN Sent: 08/23/2020   4:38 PM EST To: Janith Lima, MD Subject: Lovenox bridge                                 Dr. Ronnald Ramp,  Patient is scheduled for a endoscopy on 2/11.  She has been asked to hold warfarin for 5 days.   She rates a 5 on the CHADS score which put her at high risk for stroke.  OK for Lovenox bridge?  Please advise.  Thanks, Villa Herb, RN

## 2020-08-23 NOTE — Progress Notes (Signed)
08/23/2020 Holly Simmons [id number] [birth year]   CHIEF COMPLAINT: Difficulty swallowing  HISTORY OF PRESENT ILLNESS: Holly Simmons is a 80 year old female with a past medical history of depression, asthma, OSA uses cpap, diabetes mellitus type 2, CKD, diastolic CHF, hypertrophic cardiomyopathy s/p myomectomy complicated by a 3 cm VSD and severe TR requiring patch closure and tricuspid valve replacement at [facility] 06/2011, atrial fibrillation s/p cardioversion on Coumadin, complete heart block s/p first pacemaker placed with generator to the left lower abdomen (which remains in the abdominal wall)  06/2011, 2nd pacemaker placed right chest 08/2013,  fall injury with nasal bone fracture and epistaxis, GERD and colon polyps.  Past appendectomy, cholecystectomy, tonsillectomy, breast cyst excision and C-section.  She presents to our office today as referred by Dr. Ronnald Ramp for further evaluation regarding dysphagia.  She is accompanied by her son, [name], who facilitates obtaining her history. She complains of having dysphagia which started 1 or 2 years ago and has progressively worsened since the fall 2021.  She describes swallowing food which gets hung up in the throat or upper esophagus which results in coughing out the stuck food.  She also has a regurgitation component which occurs several hours after eating when she feels as if food is sitting in the esophagus and she coughs and expels out partially digested food.  She primarily has difficulty swallowing solid foods but sometimes office after drinking liquids.  No heartburn.  She is taking Famotidine 40 mg daily.  She has intermittent nausea, it is unclear how often this occurs.  Over the past week, she had 3 episodes when she awakened in the middle the night to vomit nonbloody bilious emesis.  No associated chest, upper abdominal or back pain. She underwent a cholecystectomy many years ago.  She reports passing a normal formed brown to  black bowel movement daily.  She reported her stools turned darker brown to black since taking oral iron.  She started taking oral iron twice daily as prescribed by her PCP less than 1 year ago for anemia. No rectal bleeding.  Her weight fluctuates.  She reported losing 4 to 5 pounds over the past month.  Her weight today is 137 pounds.  However, she reported weighing in the mid 120's fall 2021.  No fever, sweats or chills.  She underwent a colonoscopy by Dr. Maurene Capes 04/26/2014 and 2 sessile tubular adenomatous polyps were removed from the cecum and sigmoid colon.  Mild diverticulosis to the sigmoid colon was noted.  A repeat colonoscopy in 5 years was recommended but was not done.  No known family history of esophageal, gastric or colon cancer.  CBC Latest Ref Rng & Units 07/12/2020 07/12/2020 07/12/2020  WBC 4.0 - 10.5 K/uL - 5.1 4.5  Hemoglobin 12.0 - 15.0 g/dL 11.6(L) 10.7(L) 11.1(L)  Hematocrit 36.0 - 46.0 % 34.0(L) 33.2(L) 34.0(L)  Platelets 150 - 400 K/uL - 155 139.0(L)    CMP Latest Ref Rng & Units 07/12/2020 07/12/2020 07/12/2020  Glucose 70 - 99 mg/dL 125(H) 132(H) 109(H)  BUN 8 - 23 mg/dL 12 11 13   Creatinine 0.44 - 1.00 mg/dL 1.20(H) 1.26(H) 1.26(H)  Sodium 135 - 145 mmol/L 141 137 142  Potassium 3.5 - 5.1 mmol/L 3.6 3.6 4.3  Chloride 98 - 111 mmol/L 100 101 105  CO2 22 - 32 mmol/L - 30 33(H)  Calcium 8.9 - 10.3 mg/dL - 9.5 9.5  Total Protein 6.5 - 8.1 g/dL - 6.5 6.6  Total Bilirubin 0.3 -  1.2 mg/dL - 0.9 0.8  Alkaline Phos 38 - 126 U/L - 101 95  AST 15 - 41 U/L - 19 16  ALT 0 - 44 U/L - 10 7     ECHO 05/24/2020: 1. Left ventricular ejection fraction, by estimation, is 50%. The left ventricle has mildly decreased function. The left ventricle demonstrates regional wall motion abnormalities, akinesis of the basal anteroseptum. The left ventricular internal cavity size was mildly dilated. Indeterminant diastolic function. 2. Right ventricular systolic function is mildly reduced. The  right ventricular size is mildly enlarged. There is mildly elevated pulmonary artery systolic pressure. The estimated right ventricular systolic pressure is 73.4 mmHg. 3. The mitral valve is degenerative. Moderate mitral valve regurgitation. Mild mitral stenosis. The mean mitral valve gradient is 4.0 mmHg. Moderate mitral annular calcification. 4. Bioprosthetic tricuspid valve. Mean gradient 9 mmHg across the bioprosthetic tricuspid valve is suggestive of stenosis. There is mild tricuspid regurgitation. 5. The aortic valve is tricuspid. Aortic valve regurgitation is mild. Mild aortic valve stenosis. Aortic valve mean gradient measures 12.0 mmHg. 6. Left atrial size was severely dilated. 7. Right atrial size was moderately dilated. 8. The inferior vena cava is dilated in size with <50% respiratory variability, suggesting right atrial pressure of 15 mmHg.  Colonoscopy 9/23/20215 by Dr. Olevia Perches: 1. Two sessile tubular adenomatous polyps 7-9 mm were found at the cecum and in the sigmoid colon; polypectomy was performed with a cold snare x2 2. Mild diverticulosis was noted in the sigmoid colon 3. Small Grade I hemorrhoids  Past Medical History:  Diagnosis Date  . Allergic rhinitis   . Asthma    NOS w/ acute exacerbation  . Blood transfusion without reported diagnosis   . CKD (chronic kidney disease) stage 3, GFR 30-59 ml/min ([place]) 09/12/2018  . Colon polyps    TUBULAR ADENOMAS AND HYPERPLASTIC  . Complete heart block (HCC)    requiring PPM (MDT) post surgical myomectomy at [facility],  leads are epicardial with abdominal implant, high ventricular threshold at implant  . COPD (chronic obstructive pulmonary disease) ([place])   . Depressive disorder   . Diastolic dysfunction   . DM (diabetes mellitus) ([place])   . Gallstones   . GERD (gastroesophageal reflux disease)   . Gout 08/20/2012  . Heart murmur   . Hyperpotassemia   . Hypersomnia   . Hypertension   . Hypertrophic cardiomyopathy ([place])     s/p surgical myomectomy at [facility] 28/76 complicated by septal VSD post procedure requiring reoperation with patch closure and tricuspid valve replacement  . Kidney stones   . Myocardial infarction ([place]) 2011  . Obstructive sleep apnea    persistent daytime sleepiness despite cpap  . 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 Psoriasis   . Pyuria   . Renal insufficiency   . Tricuspid valve replaced    MDT 47mm Mosaic Valve  . Typical atrial flutter ([place]) 9/15   Past Surgical History:  Procedure Laterality Date  . ABDOMINAL HYSTERECTOMY    . APPENDECTOMY    . BREAST CYST EXCISION    . CARDIOVERSION N/A 08/01/2014   Procedure: CARDIOVERSION;  Surgeon: Thayer Headings, MD;  Location: [place];  Service: Cardiovascular;  Laterality: N/A;  . CESAREAN SECTION    . CHOLECYSTECTOMY    . COLONOSCOPY    . EYE SURGERY  [id number]   left eye lens implant  . EYE SURGERY  [id number]   right eye lens implant  . INSERT / REPLACE / REMOVE PACEMAKER  07/13/2012  . PACEMAKER INSERTION  07/02/11   epicardial wires with abdominal implant at [facility] 12/12,  high ventricular lead threshold at implant per Dr Westley Gambles  . PERMANENT PACEMAKER INSERTION N/A 07/13/2012   Procedure: PERMANENT PACEMAKER INSERTION;  Surgeon: Thompson Grayer, MD;  Location: [facility] CATH LAB;  Service: Cardiovascular;  Laterality: N/A;  . POLYPECTOMY    . septal myomectomy for hypertrophic CM  06/23/11   by Dr Evelina Dun at [facility], complicated by septal VSD requiring patch repair and tricuspid valve replacement  . TONSILLECTOMY    . TRICUSPID VALVE REPLACEMENT  11/12   Medtronic 60mm Mosaic tissue valve  . VSD REPAIR  06/2011   Social History: She is widowed.  Is 1 son and 1 daughter.  She lives with her son.  She is a retired Pharmacist, hospital.  Smoker.  No alcohol.  No drug use.  Family History: family history includes Bladder Cancer in her father; Breast cancer in her mother and paternal aunt; Cancer in her father and mother; Dementia in her maternal aunt, maternal  uncle, and mother; Heart attack in her father; Heart disease in her maternal grandfather and paternal grandfather; Hypertension in her daughter and mother; Ovarian cancer in her maternal aunt; Pancreatic cancer in her cousin; Prostate cancer in her father; Varicose Veins in her father.   Allergies  Allergen Reactions  . Petrolatum-Zinc Oxide Anaphylaxis, Rash and Swelling  . Sulfa Antibiotics Rash  . Sulfonamide Derivatives Anaphylaxis and Swelling  . Tetanus Toxoid     Other reaction(s): Other (See Comments) OTHER REACTION  . Tetanus Toxoids Swelling  . Other Rash and Other (See Comments)    STERI - STRIPS  It can affect proteins in her kidneys so she doesn't take REACTION: proteinuria Other reaction(s): Other (See Comments), Unknown It can affect proteins in her kidneys so she doesn't take  . Nsaids Other (See Comments)    REACTION: proteinuria Other reaction(s): Other (See Comments), Unknown It can affect proteins in her kidneys so she doesn't take   . Tetanus Toxoid, Adsorbed Other (See Comments)    Other reaction(s): Other (See Comments) Turned arm red Turned arm red      Outpatient Encounter Medications as of 08/23/2020  Medication Sig  . allopurinol (ZYLOPRIM) 100 MG tablet TAKE 1 TABLET BY MOUTH DAILY.  . B Complex-C (B-COMPLEX WITH VITAMIN C) tablet Take 1 tablet by mouth daily.  . Betamethasone Valerate 0.12 % foam APPLY TO SCALP QD (Patient taking differently: Apply 1 application topically once a week. Apply to scalp.)  . buPROPion (WELLBUTRIN XL) 300 MG 24 hr tablet TAKE 1 TABLET ONCE DAILY.  . ciclopirox (LOPROX) 0.77 % SUSP APPLY 1 PUMP TWICE A DAY.  Marland Kitchen dronabinol (MARINOL) 5 MG capsule TAKE 1 CAPSULE TWICE DAILY BEFORE LUNCH AND SUPPER.  . famotidine (PEPCID) 40 MG tablet TAKE 1 TABLET ONCE DAILY.  . fexofenadine (ALLEGRA) 180 MG tablet Take 180 mg by mouth daily as needed for allergies.  Marland Kitchen ipratropium (ATROVENT) 0.03 % nasal spray USE 2 SPRAYS IN EACH NOSTRIL 3  TIMES A DAY AS NEEDED.  Marland Kitchen ketoconazole (NIZORAL) 2 % cream APPLY TO AFFECTED AREA TWICE A DAY.  Marland Kitchen lamoTRIgine (LAMICTAL) 25 MG tablet TAKE 1 TABLET ONCE DAILY.  . metoprolol tartrate (LOPRESSOR) 25 MG tablet TAKE 1 TABLET BY MOUTH TWICE DAILY.  . modafinil (PROVIGIL) 100 MG tablet Take 1 tablet (100 mg total) by mouth daily. (Patient taking differently: Take 100 mg by mouth daily as needed.)  . PRESCRIPTION MEDICATION CPAP  . SYMBICORT 80-4.5 MCG/ACT inhaler USE  2 PUFFS TWICE A DAY.  Marland Kitchen torsemide (DEMADEX) 20 MG tablet Take 1 tablet (20 mg total) by mouth daily.  Marland Kitchen VIIBRYD 40 MG TABS TAKE 1 TABLET ONCE DAILY.  Marland Kitchen warfarin (COUMADIN) 2.5 MG tablet TAKE 1 TABLET DAILY EXCEPT 2 TABLETS ON MONDAY, WEDNESDAY, AND FRIDAYS (Patient taking differently: TAKE 2 TABLET on Mondays and Thursdays)  . [DISCONTINUED] Alum & Mag Hydroxide-Simeth (MAGIC MOUTHWASH) SOLN Take 5 mLs by mouth 3 (three) times daily. 14 days then stop    No facility-administered encounter medications on file as of 08/23/2020.    REVIEW OF SYSTEMS: Gen: Denies fever, sweats or chills.  See HPI.  CV: Denies chest pain or palpitations. + LE swelling.  Resp: See HPI. No SOB or hemoptysis. GI: See HPI GU : Denies urinary burning, blood in urine, increased urinary frequency or incontinence. MS: Arthritis pain. Derm: Denies rash, itchiness, skin lesions or unhealing ulcers. Psych: Denies depression, anxiety significant memory loss. Heme: + Bruises easily. Neuro:  Denies headaches, dizziness or paresthesias. Endo:  Denies any problems with DM, thyroid or adrenal function.  PHYSICAL EXAM: BP 118/62   Pulse 70   Ht 5\' 3"  (1.6 m)   Wt 137 lb (62.1 kg)   BMI 24.27 kg/m  General: 80 year old female in no acute distress. Head: Normocephalic and atraumatic. Eyes:  Sclerae non-icteric, conjunctive pink. Ears: Normal auditory acuity. Mouth: Dentition intact. No ulcers or lesions.  Neck: Supple, no lymphadenopathy or thyromegaly.   Lungs: Clear bilaterally to auscultation without wheezes, crackles or rhonchi. Pacemaker generator right subclavian area.  Heart: Regular rate and rhythm. + murmur.  Abdomen: Soft, nontender, non distended. Pacemaker generator to left mid/lower abdomen palpated. No hepatosplenomegaly. Normoactive bowel sounds x 4 quadrants. Mid line lower abdominal scar intact.  Rectal: Furred. Musculoskeletal: Symmetrical with no gross deformities. Skin: Warm and dry. No rash or lesions on visible extremities. Extremities: Bilateral LEs with 1 + edema, stasis dermatitis. Neurological: Alert oriented x 4, no focal deficits.  Psychological:  Alert and cooperative. Normal mood and affect.  ASSESSMENT AND PLAN:  32. 80 year old female with a history of GERD presents for further evaluation regarding dysphagia and possible regurgitation. At risk for aspiration.  -Continue Pepcid at dinner time -EGD with possible esophageal dilatation  -Barium swallow study if EGD unrevealing  -Do not eat within 3 hours of going to bed -Elevated the head of the bed -Our office will contact cardiologist, Dr. Johnsie Cancel to verify Coumadin instructions prior to EGD  2. Intermittent nausea. Nighttime vomiting episodes x 3 past week.  -CBC, CMP, Lipase -Consider CTAP after the above lab results received  3. Normocytic Anemia multifactorial: CKD. No overt GI bleeding. Hg 11.6. HCT 34. Iron 43. B12 1314. Note, patient reports taking OTC iron one bid.   4. History of tubular adenomatous colon polyps. Last colonoscopy 04/2014, 2 tubular adenomatous polyps were removed. Recall colonoscopy in 5 years was recommended but not done. -Defer decision regarding any future colonoscopy to Dr. Tarri Glenn  5. Hypertrophic cardiomyopathy s/p TVR, VSD repair, pacemake placement x 2. ECHO 05/2020. LV EF 50%. RV function mildly reduced. Mild AS and AR. Moderate MR, mild MS. Bioprosthetic TV with ? stenosis and mild TR. Followed by cardiologist Dr.  Johnsie Cancel  6. Atrial fibrillation on Eliquis  7. Sleep apnea followed by pulmonologist Dr. Halford Chessman   8. 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 CKD followed by nephrologist Dr. Carolin Sicks. Cr 1.26 -> 1.20.   CC:  Scarlette Calico  L, MD

## 2020-08-24 ENCOUNTER — Other Ambulatory Visit: Payer: Self-pay | Admitting: General Practice

## 2020-08-24 ENCOUNTER — Telehealth: Payer: Self-pay | Admitting: General Practice

## 2020-08-24 DIAGNOSIS — Z7901 Long term (current) use of anticoagulants: Secondary | ICD-10-CM

## 2020-08-24 MED ORDER — ENOXAPARIN SODIUM 60 MG/0.6ML ~~LOC~~ SOLN: 60.0000 mg | SUBCUTANEOUS | 0 refills | Status: DC

## 2020-08-24 NOTE — Telephone Encounter (Signed)
Instructions for coumadin and Lovenox pre and post procedure on 2/11.  2/6 - Last dose of coumadin until after procedure 2/7 - Nothing (No coumadin and No Lovenox) 2/8 - Lovenox x 1 in the AM 2/9 - Lovenox X 1 in the AM 2/10 - Lovenox X 1 in the AM 2/11 - PROCEDURE - NO LOVENOX TODAY!! 2/12 - Lovenox X 1 in the AM AND 2 1/2 tablets of coumadin 2/13 - Lovenox X 1 in the AM AND 2 1/2 tablets of coumadin 2/14 - Lovenox X 1 in the AM AND 2 tablets of coumadin 2/15 - Lovenox X 1 in the AM AND 1 tablet of coumadin 2/16 - Lovenox X 1 in the AM and 1 tablet of coumadin 2/17 - Check INR (Don't take anything until INR is checked)  Villa Herb, RN reviewed instructions with patient's son [name] and also send a copy by mail.

## 2020-08-24 NOTE — Telephone Encounter (Signed)
   Primary Cardiologist: Jenkins Rouge, MD  Chart reviewed as part of pre-operative protocol coverage. Given past medical history and time since last visit, based on ACC/AHA guidelines, HEPHZIBAH STREHLE would be at acceptable risk for the planned procedure without further cardiovascular testing.   Patient with diagnosis of afib/flutter on warfarin for anticoagulation.  Also has HOCM, tricuspid valve replaced with bioprosthetic valve.  Procedure: endoscopy Date of procedure: 09/14/20  CHA2DS2-VASc Score = 7  This indicates a 11.2% annual risk of stroke. The patient's score is based upon: CHF History: Yes HTN History: Yes Diabetes History: Yes Stroke History: No Vascular Disease History: Yes Age Score: 2 Gender Score: 1  CrCl 29.9mL/min based on SCr checked today  Platelet count 149K  Per office protocol, patient can hold warfarin for 5 days prior to procedure. Patient will need bridging with Lovenox around procedure.  Pt to be bridged with Lovenox 60mg  once daily due to CKD. INRs are followed at PCP office, will forward info so that they can coordinate bridge with pt about a week prior to 2/11 procedure.  I will route this recommendation to the requesting party via Epic fax function and remove from pre-op pool.  Please call with questions.  Jossie Ng. Velma Hanna NP-C    08/24/2020, 10:26 AM [address] [place] Office [phone] Fax [phone]

## 2020-08-24 NOTE — Telephone Encounter (Signed)
Once daily fine when INR <2

## 2020-08-24 NOTE — Telephone Encounter (Signed)
Pt to be bridged with Lovenox 60mg  once daily due to CKD. INRs are followed at PCP office, will forward info so that they can coordinate bridge with pt about a week prior to 2/11 procedure.

## 2020-08-27 DIAGNOSIS — M1030 Gout due to renal impairment, unspecified site: Secondary | ICD-10-CM | POA: Diagnosis not present

## 2020-08-27 DIAGNOSIS — D631 Anemia in chronic kidney disease: Secondary | ICD-10-CM | POA: Diagnosis not present

## 2020-08-27 DIAGNOSIS — E1122 Type 2 diabetes mellitus with diabetic chronic kidney disease: Secondary | ICD-10-CM | POA: Diagnosis not present

## 2020-08-27 DIAGNOSIS — N1832 Chronic kidney disease, stage 3b: Secondary | ICD-10-CM | POA: Diagnosis not present

## 2020-08-27 DIAGNOSIS — I480 Paroxysmal atrial fibrillation: Secondary | ICD-10-CM | POA: Diagnosis not present

## 2020-08-27 DIAGNOSIS — S022XXD Fracture of nasal bones, subsequent encounter for fracture with routine healing: Secondary | ICD-10-CM | POA: Diagnosis not present

## 2020-08-27 DIAGNOSIS — N183 Chronic kidney disease, stage 3 unspecified: Secondary | ICD-10-CM | POA: Diagnosis not present

## 2020-08-27 DIAGNOSIS — I442 Atrioventricular block, complete: Secondary | ICD-10-CM | POA: Diagnosis not present

## 2020-08-27 DIAGNOSIS — D62 Acute posthemorrhagic anemia: Secondary | ICD-10-CM | POA: Diagnosis not present

## 2020-08-27 DIAGNOSIS — I129 Hypertensive chronic kidney disease with stage 1 through stage 4 chronic kidney disease, or unspecified chronic kidney disease: Secondary | ICD-10-CM | POA: Diagnosis not present

## 2020-08-27 DIAGNOSIS — N189 Chronic kidney disease, unspecified: Secondary | ICD-10-CM | POA: Diagnosis not present

## 2020-08-28 NOTE — Telephone Encounter (Signed)
Patient has been notified and already contact by PCP for bridge.

## 2020-08-29 ENCOUNTER — Other Ambulatory Visit: Payer: Self-pay | Admitting: Nurse Practitioner

## 2020-08-29 DIAGNOSIS — K219 Gastro-esophageal reflux disease without esophagitis: Secondary | ICD-10-CM

## 2020-08-29 DIAGNOSIS — R1310 Dysphagia, unspecified: Secondary | ICD-10-CM

## 2020-08-29 DIAGNOSIS — R112 Nausea with vomiting, unspecified: Secondary | ICD-10-CM

## 2020-08-29 MED ORDER — SUPREP BOWEL PREP KIT 17.5-3.13-1.6 GM/177ML PO SOLN
1.0000 | ORAL | 0 refills | Status: DC
Start: 2020-08-29 — End: 2020-09-14

## 2020-08-30 DIAGNOSIS — S022XXD Fracture of nasal bones, subsequent encounter for fracture with routine healing: Secondary | ICD-10-CM | POA: Diagnosis not present

## 2020-08-30 DIAGNOSIS — I129 Hypertensive chronic kidney disease with stage 1 through stage 4 chronic kidney disease, or unspecified chronic kidney disease: Secondary | ICD-10-CM | POA: Diagnosis not present

## 2020-08-30 DIAGNOSIS — M1030 Gout due to renal impairment, unspecified site: Secondary | ICD-10-CM | POA: Diagnosis not present

## 2020-08-30 DIAGNOSIS — N183 Chronic kidney disease, stage 3 unspecified: Secondary | ICD-10-CM | POA: Diagnosis not present

## 2020-08-30 DIAGNOSIS — D62 Acute posthemorrhagic anemia: Secondary | ICD-10-CM | POA: Diagnosis not present

## 2020-08-30 DIAGNOSIS — D631 Anemia in chronic kidney disease: Secondary | ICD-10-CM | POA: Diagnosis not present

## 2020-08-30 DIAGNOSIS — I480 Paroxysmal atrial fibrillation: Secondary | ICD-10-CM | POA: Diagnosis not present

## 2020-08-30 DIAGNOSIS — I442 Atrioventricular block, complete: Secondary | ICD-10-CM | POA: Diagnosis not present

## 2020-08-30 DIAGNOSIS — E1122 Type 2 diabetes mellitus with diabetic chronic kidney disease: Secondary | ICD-10-CM | POA: Diagnosis not present

## 2020-08-31 ENCOUNTER — Encounter: Payer: Self-pay | Admitting: Gastroenterology

## 2020-09-03 ENCOUNTER — Telehealth: Payer: Self-pay | Admitting: Emergency Medicine

## 2020-09-03 DIAGNOSIS — D631 Anemia in chronic kidney disease: Secondary | ICD-10-CM | POA: Diagnosis not present

## 2020-09-03 DIAGNOSIS — I129 Hypertensive chronic kidney disease with stage 1 through stage 4 chronic kidney disease, or unspecified chronic kidney disease: Secondary | ICD-10-CM | POA: Diagnosis not present

## 2020-09-03 DIAGNOSIS — R809 Proteinuria, unspecified: Secondary | ICD-10-CM | POA: Diagnosis not present

## 2020-09-03 DIAGNOSIS — I4891 Unspecified atrial fibrillation: Secondary | ICD-10-CM | POA: Diagnosis not present

## 2020-09-03 DIAGNOSIS — N1832 Chronic kidney disease, stage 3b: Secondary | ICD-10-CM | POA: Diagnosis not present

## 2020-09-03 DIAGNOSIS — N2581 Secondary hyperparathyroidism of renal origin: Secondary | ICD-10-CM | POA: Diagnosis not present

## 2020-09-03 NOTE — Telephone Encounter (Signed)
Dr Mali Merrell called and stated that patient is have a dental extraction done on wednesday 09/12/2020. Dr Marily Memos wants to know if you would like the patient to stop her coumadin and if so for how long? Please advise thanks.

## 2020-09-03 NOTE — Telephone Encounter (Signed)
This is Dr Jenny Reichmann for Dr Eliezer Champagne to hold coumadin for 5 days prior, then restart next day, and coordinate f/u INR with coumadin clinic

## 2020-09-04 ENCOUNTER — Ambulatory Visit: Payer: Medicare PPO | Admitting: Nurse Practitioner

## 2020-09-04 NOTE — Telephone Encounter (Signed)
Pt's son has been informed and expressed understanding.    

## 2020-09-07 DIAGNOSIS — D631 Anemia in chronic kidney disease: Secondary | ICD-10-CM | POA: Diagnosis not present

## 2020-09-07 DIAGNOSIS — I480 Paroxysmal atrial fibrillation: Secondary | ICD-10-CM | POA: Diagnosis not present

## 2020-09-07 DIAGNOSIS — D62 Acute posthemorrhagic anemia: Secondary | ICD-10-CM | POA: Diagnosis not present

## 2020-09-07 DIAGNOSIS — N183 Chronic kidney disease, stage 3 unspecified: Secondary | ICD-10-CM | POA: Diagnosis not present

## 2020-09-07 DIAGNOSIS — I129 Hypertensive chronic kidney disease with stage 1 through stage 4 chronic kidney disease, or unspecified chronic kidney disease: Secondary | ICD-10-CM | POA: Diagnosis not present

## 2020-09-07 DIAGNOSIS — I442 Atrioventricular block, complete: Secondary | ICD-10-CM | POA: Diagnosis not present

## 2020-09-07 DIAGNOSIS — M1030 Gout due to renal impairment, unspecified site: Secondary | ICD-10-CM | POA: Diagnosis not present

## 2020-09-07 DIAGNOSIS — S022XXD Fracture of nasal bones, subsequent encounter for fracture with routine healing: Secondary | ICD-10-CM | POA: Diagnosis not present

## 2020-09-07 DIAGNOSIS — E1122 Type 2 diabetes mellitus with diabetic chronic kidney disease: Secondary | ICD-10-CM | POA: Diagnosis not present

## 2020-09-10 DIAGNOSIS — S022XXD Fracture of nasal bones, subsequent encounter for fracture with routine healing: Secondary | ICD-10-CM | POA: Diagnosis not present

## 2020-09-10 DIAGNOSIS — I442 Atrioventricular block, complete: Secondary | ICD-10-CM | POA: Diagnosis not present

## 2020-09-10 DIAGNOSIS — D62 Acute posthemorrhagic anemia: Secondary | ICD-10-CM | POA: Diagnosis not present

## 2020-09-10 DIAGNOSIS — N183 Chronic kidney disease, stage 3 unspecified: Secondary | ICD-10-CM | POA: Diagnosis not present

## 2020-09-10 DIAGNOSIS — I480 Paroxysmal atrial fibrillation: Secondary | ICD-10-CM | POA: Diagnosis not present

## 2020-09-10 DIAGNOSIS — D631 Anemia in chronic kidney disease: Secondary | ICD-10-CM | POA: Diagnosis not present

## 2020-09-10 DIAGNOSIS — I129 Hypertensive chronic kidney disease with stage 1 through stage 4 chronic kidney disease, or unspecified chronic kidney disease: Secondary | ICD-10-CM | POA: Diagnosis not present

## 2020-09-10 DIAGNOSIS — M1030 Gout due to renal impairment, unspecified site: Secondary | ICD-10-CM | POA: Diagnosis not present

## 2020-09-10 DIAGNOSIS — E1122 Type 2 diabetes mellitus with diabetic chronic kidney disease: Secondary | ICD-10-CM | POA: Diagnosis not present

## 2020-09-14 ENCOUNTER — Ambulatory Visit (AMBULATORY_SURGERY_CENTER): Payer: Medicare PPO | Admitting: Gastroenterology

## 2020-09-14 ENCOUNTER — Other Ambulatory Visit: Payer: Self-pay

## 2020-09-14 ENCOUNTER — Encounter: Payer: Self-pay | Admitting: Gastroenterology

## 2020-09-14 VITALS — BP 106/52 | HR 60 | Temp 97.8°F | Resp 12 | Ht 63.0 in | Wt 137.0 lb

## 2020-09-14 DIAGNOSIS — K449 Diaphragmatic hernia without obstruction or gangrene: Secondary | ICD-10-CM | POA: Diagnosis not present

## 2020-09-14 DIAGNOSIS — R1310 Dysphagia, unspecified: Secondary | ICD-10-CM

## 2020-09-14 DIAGNOSIS — K2901 Acute gastritis with bleeding: Secondary | ICD-10-CM

## 2020-09-14 DIAGNOSIS — R112 Nausea with vomiting, unspecified: Secondary | ICD-10-CM

## 2020-09-14 DIAGNOSIS — K319 Disease of stomach and duodenum, unspecified: Secondary | ICD-10-CM

## 2020-09-14 DIAGNOSIS — Z86010 Personal history of colonic polyps: Secondary | ICD-10-CM

## 2020-09-14 DIAGNOSIS — D123 Benign neoplasm of transverse colon: Secondary | ICD-10-CM | POA: Diagnosis not present

## 2020-09-14 DIAGNOSIS — D120 Benign neoplasm of cecum: Secondary | ICD-10-CM | POA: Diagnosis not present

## 2020-09-14 DIAGNOSIS — Z1211 Encounter for screening for malignant neoplasm of colon: Secondary | ICD-10-CM | POA: Diagnosis not present

## 2020-09-14 DIAGNOSIS — K317 Polyp of stomach and duodenum: Secondary | ICD-10-CM

## 2020-09-14 DIAGNOSIS — K3189 Other diseases of stomach and duodenum: Secondary | ICD-10-CM | POA: Diagnosis not present

## 2020-09-14 DIAGNOSIS — K219 Gastro-esophageal reflux disease without esophagitis: Secondary | ICD-10-CM

## 2020-09-14 MED ORDER — SODIUM CHLORIDE 0.9 % IV SOLN: 500.0000 mL | Freq: Once | INTRAVENOUS | Status: DC

## 2020-09-14 NOTE — Op Note (Signed)
[facility] Patient Name: Emile Ringgenberg Procedure Date: 09/14/2020 9:43 AM MRN: [id number] Endoscopist: Thornton Park MD, MD Age: 80 Referring MD:  Date of Birth: August 15, 1940 Gender: Female Account #: [id number] Procedure:                Colonoscopy Indications:              Surveillance: Personal history of adenomatous                            polyps on last colonoscopy > 5 years ago                           Colonoscopy 2015: 2 tubular adenomas Medicines:                Monitored Anesthesia Care Procedure:                Pre-Anesthesia Assessment:                           - Prior to the procedure, a History and Physical                            was performed, and patient medications and                            allergies were reviewed. The patient's tolerance of                            previous anesthesia was also reviewed. The risks                            and benefits of the procedure and the sedation                            options and risks were discussed with the patient.                            All questions were answered, and informed consent                            was obtained. Prior Anticoagulants: The patient has                            taken Lovenox (enoxaparin), last dose was 1 day                            prior to procedure. ASA Grade Assessment: III - A                            patient with severe systemic disease. After                            reviewing the risks and benefits, the patient was  deemed in satisfactory condition to undergo the                            procedure.                           After obtaining informed consent, the colonoscope                            was passed under direct vision. Throughout the                            procedure, the patient's blood pressure, pulse, and                            oxygen saturations were monitored continuously. The                             Olympus CF-HQ190 [id number]) Colonoscope was                            introduced through the anus and advanced to the the                            cecum, identified by appendiceal orifice and                            ileocecal valve. The colonoscopy was performed with                            moderate difficulty due to poor endoscopic                            visualization. The patient tolerated the procedure                            well. The quality of the bowel preparation was                            poor. There was black residual stool present in the                            colon. The ileocecal valve, appendiceal orifice,                            and rectum were photographed. Scope In: 10:08:12 AM Scope Out: 10:24:41 AM Scope Withdrawal Time: 0 hours 12 minutes 31 seconds  Total Procedure Duration: 0 hours 16 minutes 29 seconds  Findings:                 Many small and large-mouthed diverticula were found                            in the sigmoid colon and descending colon.  A 8 mm polyp was found in the sigmoid colon located                            36 cm from the anal verge. The polyp was sessile.                            The polyp was removed with a cold snare. Resection                            and retrieval were complete. Estimated blood loss                            was minimal.                           A 6 mm polyp was found in the hepatic flexure. The                            polyp was flat and serpiginous. The polyp was                            removed with a cold snare. Resection and retrieval                            were complete. Estimated blood loss was minimal.                           Three sessile polyps were found in the cecum. The                            polyps were 1 to 2 mm in size. These polyps were                            removed with a cold snare. Resection and retrieval                             were complete. Estimated blood loss was minimal.                           The exam was otherwise without abnormality on                            direct and retroflexion views. Complications:            No immediate complications. Estimated blood loss:                            Minimal. Estimated Blood Loss:     Estimated blood loss was minimal. Impression:               - Preparation of the colon was poor.                           -  Diverticulosis in the sigmoid colon and in the                            descending colon.                           - One 8 mm polyp in the sigmoid colon, removed with                            a cold snare. Resected and retrieved.                           - One 6 mm polyp at the hepatic flexure, removed                            with a cold snare. Resected and retrieved.                           - Three 1 to 2 mm polyps in the cecum, removed with                            a cold snare. Resected and retrieved.                           - The examination was otherwise normal on direct                            and retroflexion views. Recommendation:           - Patient has a contact number available for                            emergencies. The signs and symptoms of potential                            delayed complications were discussed with the                            patient. Return to normal activities tomorrow.                            Written discharge instructions were provided to the                            patient.                           - Resume previous diet. High fiber diet recommended.                           - Continue present medications.                           - Await pathology results. No further endoscopy  planned as Mrs. Gianfrancesco is 82.                           - Resume Lovenox tomorrow. Resume warfarin as                            instructed by the prescribing  doctor. Thornton Park MD, MD 09/14/2020 10:37:36 AM This report has been signed electronically.

## 2020-09-14 NOTE — Progress Notes (Signed)
Medical history reviewed with no changes noted. VS assessed by C.W 

## 2020-09-14 NOTE — Progress Notes (Signed)
Pt's daughter assisted with dressing her mother.    No problems noted in the recovery room. maw

## 2020-09-14 NOTE — Op Note (Signed)
[place] Patient Name: Holly Simmons Procedure Date: 09/14/2020 9:43 AM MRN: [id number] Endoscopist: Thornton Park MD, MD Age: 80 Referring MD:  Date of Birth: 12/12/1940 Gender: Female Account #: [id number] Procedure:                Upper GI endoscopy Indications:              Dysphagia, Nausea with vomiting Medicines:                Monitored Anesthesia Care Procedure:                Pre-Anesthesia Assessment:                           - Prior to the procedure, a History and Physical                            was performed, and patient medications and                            allergies were reviewed. The patient's tolerance of                            previous anesthesia was also reviewed. The risks                            and benefits of the procedure and the sedation                            options and risks were discussed with the patient.                            All questions were answered, and informed consent                            was obtained. Prior Anticoagulants: The patient has                            taken Lovenox (enoxaparin), last dose was 1 day                            prior to procedure. ASA Grade Assessment: III - A                            patient with severe systemic disease. After                            reviewing the risks and benefits, the patient was                            deemed in satisfactory condition to undergo the                            procedure.  After obtaining informed consent, the endoscope was                            passed under direct vision. Throughout the                            procedure, the patient's blood pressure, pulse, and                            oxygen saturations were monitored continuously. The                            Endoscope was introduced through the mouth, and                            advanced to the third part of duodenum. The upper                             GI endoscopy was accomplished without difficulty.                            The patient tolerated the procedure well. Scope In: Scope Out: Findings:                 The examined esophagus was normal. No ring, web, or                            stricture. No esophagitis or Barrett's esophagus.                            Biopsies were obtained from the proximal and distal                            esophagus with cold forceps for histology.                           A small hiatal hernia was identified. Diffuse                            moderate inflammation with hemorrhage characterized                            by erythema, friability and granularity was found                            in the gastric body. Biopsies were taken from the                            antrum, body, and fundus with a cold forceps for                            histology. Estimated blood loss was minimal.  Multiple sessile polyps with no bleeding were found                            in the gastric fundus. Biopsies were taken with a                            cold forceps for histology. Estimated blood loss                            was minimal.                           Duodenal mucosa appeared normal. Biopsies were                            taken with a cold forceps for histology. Estimated                            blood loss was minimal.                           A single 2 mm sessile polyp was found in the second                            portion of the duodenum. The polyp was removed with                            a cold biopsy forceps. Resection and retrieval were                            complete. Estimated blood loss was minimal. Complications:            No immediate complications. Estimated blood loss:                            Minimal. Estimated Blood Loss:     Estimated blood loss was minimal. Impression:               - Normal  esophagus. Biopsied.                           - Gastritis with hemorrhage. Biopsied.                           - Multiple gastric polyps. Biopsied.                           - Small duodenal polyp. Resected and retrieved.                           - Otherwise normal examined duodenum. Biopsied. Recommendation:           - Patient has a contact number available for                            emergencies. The signs  and symptoms of potential                            delayed complications were discussed with the                            patient. Return to normal activities tomorrow.                            Written discharge instructions were provided to the                            patient.                           - Resume previous diet.                           - Continue present medications.                           - Proceed with esophagram for further evaluation of                            dysphagia.                           - No aspirin, ibuprofen, naproxen, or other                            non-steroidal anti-inflammatory drugs.                           - Proceed with colonoscopy. Thornton Park MD, MD 09/14/2020 10:31:50 AM This report has been signed electronically.

## 2020-09-14 NOTE — Patient Instructions (Addendum)
Handouts were given to you on Gastritis, polyps, diverticulosis, and a high fiber diet with a liberal amount of fluid. Resume LOVENOX tomorrow per Dr. Tarri Glenn. Restart  your COUMADIN (warfarin) as instructed by the prescribing doctor.  NO ASPIRIN, ASPIRIN CONTAINING PRODUCTS (BC OR GOODY POWDERS) OR NSAIDS (IBUPROFEN, ADVIL, ALEVE, AND MOTRIN) FOR; TYLENOL IS OK TO TAKE if needed. You may resume your other current medications today. Per Dr. Tarri Glenn proceed with esophagram for further evaluation of difficuliity swallowing. Await biopsy results.  May take 1-3 weeks to receive pathology results. Please call if any questions or concerns.     YOU HAD AN ENDOSCOPIC PROCEDURE TODAY AT [facility]:   Refer to the procedure report that was given to you for any specific questions about what was found during the examination.  If the procedure report does not answer your questions, please call your gastroenterologist to clarify.  If you requested that your care partner not be given the details of your procedure findings, then the procedure report has been included in a sealed envelope for you to review at your convenience later.  YOU SHOULD EXPECT: Some feelings of bloating in the abdomen. Passage of more gas than usual.  Walking can help get rid of the air that was put into your GI tract during the procedure and reduce the bloating. If you had a lower endoscopy (such as a colonoscopy or flexible sigmoidoscopy) you may notice spotting of blood in your stool or on the toilet paper. If you underwent a bowel prep for your procedure, you may not have a normal bowel movement for a few days.  Please Note:  You might notice some irritation and congestion in your nose or some drainage.  This is from the oxygen used during your procedure.  There is no need for concern and it should clear up in a day or so.  SYMPTOMS TO REPORT IMMEDIATELY:   Following lower endoscopy (colonoscopy or flexible  sigmoidoscopy):  Excessive amounts of blood in the stool  Significant tenderness or worsening of abdominal pains  Swelling of the abdomen that is new, acute  Fever of 100F or higher   Following upper endoscopy (EGD)  Vomiting of blood or coffee ground material  New chest pain or pain under the shoulder blades  Painful or persistently difficult swallowing  New shortness of breath  Fever of 100F or higher  Black, tarry-looking stools  For urgent or emergent issues, a gastroenterologist can be reached at any hour by calling [phone]. Do not use MyChart messaging for urgent concerns.    DIET:  We do recommend a small meal at first, but then you may proceed to your regular diet.  Drink plenty of fluids but you should avoid alcoholic beverages for 24 hours.  ACTIVITY:  You should plan to take it easy for the rest of today and you should NOT DRIVE or use heavy machinery until tomorrow (because of the sedation medicines used during the test).    FOLLOW UP: Our staff will call the number listed on your records 48-72 hours following your procedure to check on you and address any questions or concerns that you may have regarding the information given to you following your procedure. If we do not reach you, we will leave a message.  We will attempt to reach you two times.  During this call, we will ask if you have developed any symptoms of COVID 19. If you develop any symptoms (ie: fever, flu-like symptoms, shortness  of breath, cough etc.) before then, please call [phone].  If you test positive for Covid 19 in the 2 weeks post procedure, please call and report this information to [place].    If any biopsies were taken you will be contacted by phone or by letter within the next 1-3 weeks.  Please call us at [phone] if you have not heard about the biopsies in 3 weeks.    SIGNATURES/CONFIDENTIALITY: You and/or your care partner have signed paperwork which will be entered into your  electronic medical record.  These signatures attest to the fact that that the information above on your After Visit Summary has been reviewed and is understood.  Full responsibility of the confidentiality of this discharge information lies with you and/or your care-partner.

## 2020-09-14 NOTE — Progress Notes (Signed)
To PACU, VSS. Report to [email or web address] 

## 2020-09-17 ENCOUNTER — Other Ambulatory Visit: Payer: Self-pay | Admitting: Internal Medicine

## 2020-09-17 DIAGNOSIS — D62 Acute posthemorrhagic anemia: Secondary | ICD-10-CM | POA: Diagnosis not present

## 2020-09-17 DIAGNOSIS — I442 Atrioventricular block, complete: Secondary | ICD-10-CM | POA: Diagnosis not present

## 2020-09-17 DIAGNOSIS — N183 Chronic kidney disease, stage 3 unspecified: Secondary | ICD-10-CM | POA: Diagnosis not present

## 2020-09-17 DIAGNOSIS — R64 Cachexia: Secondary | ICD-10-CM

## 2020-09-17 DIAGNOSIS — E1122 Type 2 diabetes mellitus with diabetic chronic kidney disease: Secondary | ICD-10-CM | POA: Diagnosis not present

## 2020-09-17 DIAGNOSIS — M1030 Gout due to renal impairment, unspecified site: Secondary | ICD-10-CM | POA: Diagnosis not present

## 2020-09-17 DIAGNOSIS — Z7901 Long term (current) use of anticoagulants: Secondary | ICD-10-CM

## 2020-09-17 DIAGNOSIS — I129 Hypertensive chronic kidney disease with stage 1 through stage 4 chronic kidney disease, or unspecified chronic kidney disease: Secondary | ICD-10-CM | POA: Diagnosis not present

## 2020-09-17 DIAGNOSIS — D631 Anemia in chronic kidney disease: Secondary | ICD-10-CM | POA: Diagnosis not present

## 2020-09-17 DIAGNOSIS — I480 Paroxysmal atrial fibrillation: Secondary | ICD-10-CM | POA: Diagnosis not present

## 2020-09-17 DIAGNOSIS — S022XXD Fracture of nasal bones, subsequent encounter for fracture with routine healing: Secondary | ICD-10-CM | POA: Diagnosis not present

## 2020-09-17 DIAGNOSIS — K219 Gastro-esophageal reflux disease without esophagitis: Secondary | ICD-10-CM

## 2020-09-17 NOTE — Telephone Encounter (Signed)
[place] is requesting a refill on amoxicillin. Would Dr. Johnsie Cancel like to refill this medication? Please address

## 2020-09-18 ENCOUNTER — Other Ambulatory Visit: Payer: Self-pay

## 2020-09-18 ENCOUNTER — Telehealth: Payer: Self-pay

## 2020-09-18 DIAGNOSIS — R1310 Dysphagia, unspecified: Secondary | ICD-10-CM

## 2020-09-18 NOTE — Telephone Encounter (Signed)
  Follow up Call-  Call back number 09/14/2020  Post procedure Call Back phone  # [phone]  Permission to leave phone message Yes  Some recent data might be hidden     Patient questions:  Do you have a fever, pain , or abdominal swelling? No. Pain Score  0 *  Have you tolerated food without any problems? Yes.    Have you been able to return to your normal activities? Yes.    Do you have any questions about your discharge instructions: Diet   No. Medications  No. Follow up visit  No.  Do you have questions or concerns about your Care? No.  Actions: * If pain score is 4 or above: No action needed, pain <4.  I spoke with pt's son, Holly Simmons.  He said she was so week from not eating before the procedure.  She is getting stronger.  [place]  1. Have you developed a fever since your procedure? no  2.   Have you had an respiratory symptoms (SOB or cough) since your procedure? no  3.   Have you tested positive for COVID 19 since your procedure no  4.   Have you had any family members/close contacts diagnosed with the COVID 19 since your procedure?  no   If yes to any of these questions please route to Joylene John, RN and Joella Prince, RN

## 2020-09-20 ENCOUNTER — Other Ambulatory Visit: Payer: Self-pay

## 2020-09-20 ENCOUNTER — Ambulatory Visit: Payer: Medicare PPO | Admitting: General Practice

## 2020-09-20 DIAGNOSIS — Z7901 Long term (current) use of anticoagulants: Secondary | ICD-10-CM | POA: Diagnosis not present

## 2020-09-20 DIAGNOSIS — I4892 Unspecified atrial flutter: Secondary | ICD-10-CM | POA: Diagnosis not present

## 2020-09-20 LAB — POCT INR: INR: 2.4 (ref 2.0–3.0)

## 2020-09-20 NOTE — Progress Notes (Signed)
Medical screening examination/treatment/procedure(s) were performed by non-physician practitioner and as supervising physician I was immediately available for consultation/collaboration. I agree with above. Arcenia Scarbro, MD   

## 2020-09-20 NOTE — Patient Instructions (Signed)
Pre visit review using our clinic review tool, if applicable. No additional management support is needed unless otherwise documented below in the visit note.  Continue to take 1 tablet daily except take 2 tablets on Mondays and Thursdays.    Re-check in 6 weeks

## 2020-09-28 ENCOUNTER — Other Ambulatory Visit: Payer: Self-pay

## 2020-09-28 DIAGNOSIS — R112 Nausea with vomiting, unspecified: Secondary | ICD-10-CM

## 2020-09-28 DIAGNOSIS — R1310 Dysphagia, unspecified: Secondary | ICD-10-CM

## 2020-10-04 DIAGNOSIS — H524 Presbyopia: Secondary | ICD-10-CM | POA: Diagnosis not present

## 2020-10-04 DIAGNOSIS — E119 Type 2 diabetes mellitus without complications: Secondary | ICD-10-CM | POA: Diagnosis not present

## 2020-10-04 LAB — HM DIABETES EYE EXAM

## 2020-10-09 ENCOUNTER — Ambulatory Visit (INDEPENDENT_AMBULATORY_CARE_PROVIDER_SITE_OTHER): Payer: Medicare PPO

## 2020-10-09 DIAGNOSIS — I442 Atrioventricular block, complete: Secondary | ICD-10-CM

## 2020-10-09 LAB — CUP PACEART REMOTE DEVICE CHECK
Battery Impedance: 1213 Ohm
Battery Remaining Longevity: 49 mo
Battery Voltage: 2.78 V
Brady Statistic AP VP Percent: 56 %
Brady Statistic AP VS Percent: 0 %
Brady Statistic AS VP Percent: 44 %
Brady Statistic AS VS Percent: 0 %
Date Time Interrogation Session: 20220308085636
Implantable Lead Implant Date: 20131210
Implantable Lead Implant Date: 20131210
Implantable Lead Location: 753858
Implantable Lead Location: 753859
Implantable Lead Model: 4296
Implantable Lead Model: 5076
Implantable Pulse Generator Implant Date: 20131210
Lead Channel Impedance Value: 424 Ohm
Lead Channel Impedance Value: 870 Ohm
Lead Channel Pacing Threshold Amplitude: 0.875 V
Lead Channel Pacing Threshold Amplitude: 1.5 V
Lead Channel Pacing Threshold Pulse Width: 0.4 ms
Lead Channel Pacing Threshold Pulse Width: 0.4 ms
Lead Channel Setting Pacing Amplitude: 2 V
Lead Channel Setting Pacing Amplitude: 3 V
Lead Channel Setting Pacing Pulse Width: 0.4 ms
Lead Channel Setting Sensing Sensitivity: 8 mV

## 2020-10-11 ENCOUNTER — Other Ambulatory Visit: Payer: Self-pay | Admitting: Internal Medicine

## 2020-10-11 ENCOUNTER — Ambulatory Visit (HOSPITAL_COMMUNITY)
Admission: RE | Admit: 2020-10-11 | Discharge: 2020-10-11 | Disposition: A | Discharge status: Home / Self Care | Payer: Medicare PPO | Source: Ambulatory Visit | Attending: Gastroenterology | Admitting: Gastroenterology

## 2020-10-11 ENCOUNTER — Other Ambulatory Visit: Payer: Self-pay

## 2020-10-11 DIAGNOSIS — R1310 Dysphagia, unspecified: Secondary | ICD-10-CM | POA: Diagnosis not present

## 2020-10-11 DIAGNOSIS — R112 Nausea with vomiting, unspecified: Secondary | ICD-10-CM | POA: Insufficient documentation

## 2020-10-11 DIAGNOSIS — I5032 Chronic diastolic (congestive) heart failure: Secondary | ICD-10-CM

## 2020-10-11 DIAGNOSIS — Z7901 Long term (current) use of anticoagulants: Secondary | ICD-10-CM

## 2020-10-11 DIAGNOSIS — R600 Localized edema: Secondary | ICD-10-CM

## 2020-10-17 NOTE — Progress Notes (Signed)
Remote pacemaker transmission.   

## 2020-11-01 ENCOUNTER — Ambulatory Visit: Payer: Medicare PPO | Admitting: General Practice

## 2020-11-01 ENCOUNTER — Other Ambulatory Visit: Payer: Self-pay

## 2020-11-01 DIAGNOSIS — Z7901 Long term (current) use of anticoagulants: Secondary | ICD-10-CM

## 2020-11-01 DIAGNOSIS — I4892 Unspecified atrial flutter: Secondary | ICD-10-CM | POA: Diagnosis not present

## 2020-11-01 LAB — POCT INR: INR: 2.3 (ref 2.0–3.0)

## 2020-11-01 NOTE — Patient Instructions (Addendum)
Pre visit review using our clinic review tool, if applicable. No additional management support is needed unless otherwise documented below in the visit note.  Continue to take 1 tablet daily except take 2 tablets on Mondays and Thursdays.    Re-check in 6 weeks

## 2020-11-02 ENCOUNTER — Ambulatory Visit: Payer: Medicare PPO | Admitting: Nurse Practitioner

## 2020-11-05 NOTE — Progress Notes (Signed)
I have reviewed and agree.

## 2020-11-07 ENCOUNTER — Other Ambulatory Visit: Payer: Self-pay

## 2020-11-07 ENCOUNTER — Ambulatory Visit: Payer: Medicare PPO | Admitting: Internal Medicine

## 2020-11-08 ENCOUNTER — Encounter: Payer: Self-pay | Admitting: Internal Medicine

## 2020-11-08 ENCOUNTER — Other Ambulatory Visit: Payer: Medicare PPO

## 2020-11-08 ENCOUNTER — Other Ambulatory Visit: Payer: Self-pay

## 2020-11-08 ENCOUNTER — Ambulatory Visit: Payer: Medicare PPO | Admitting: Internal Medicine

## 2020-11-08 VITALS — BP 136/78 | HR 96 | Temp 97.8°F | Ht 63.0 in | Wt 132.0 lb

## 2020-11-08 DIAGNOSIS — D638 Anemia in other chronic diseases classified elsewhere: Secondary | ICD-10-CM

## 2020-11-08 DIAGNOSIS — D500 Iron deficiency anemia secondary to blood loss (chronic): Secondary | ICD-10-CM

## 2020-11-08 DIAGNOSIS — N185 Chronic kidney disease, stage 5: Secondary | ICD-10-CM

## 2020-11-08 NOTE — Progress Notes (Signed)
Subjective:  Patient ID: Holly Simmons, female    DOB: 04/18/1941  Age: 80 y.o. MRN: [id number]  CC: Anemia  This visit occurred during the SARS-CoV-2 public health emergency.  Safety protocols were in place, including screening questions prior to the visit, additional usage of staff PPE, and extensive cleaning of exam room while observing appropriate contact time as indicated for disinfecting solutions.    HPI Holly Simmons presents for f/up - She continues to lose weight. Her son complains that she does not eat lunch.  Outpatient Medications Prior to Visit  Medication Sig Dispense Refill  . allopurinol (ZYLOPRIM) 100 MG tablet TAKE 1 TABLET BY MOUTH DAILY. 90 tablet 0  . amoxicillin (AMOXIL) 500 MG capsule TAKE 4 CAPSULES 1 HOUR PRIOR TO PROCEDURE 8 capsule 3  . B Complex-C (B-COMPLEX WITH VITAMIN C) tablet Take 1 tablet by mouth daily.    . Betamethasone Valerate 0.12 % foam APPLY TO SCALP QD 100 g 2  . ciclopirox (LOPROX) 0.77 % SUSP APPLY 1 PUMP TWICE A DAY. 60 mL 0  . dronabinol (MARINOL) 5 MG capsule TAKE 1 CAPSULE TWICE DAILY BEFORE LUNCH AND SUPPER. 180 capsule 0  . famotidine (PEPCID) 40 MG tablet TAKE 1 TABLET ONCE DAILY. 90 tablet 0  . fexofenadine (ALLEGRA) 180 MG tablet Take 180 mg by mouth daily as needed for allergies.    Marland Kitchen ipratropium (ATROVENT) 0.03 % nasal spray USE 2 SPRAYS IN EACH NOSTRIL 3 TIMES A DAY AS NEEDED. 30 mL 0  . ketoconazole (NIZORAL) 2 % cream APPLY TO AFFECTED AREA TWICE A DAY. 60 g 0  . metoprolol tartrate (LOPRESSOR) 25 MG tablet TAKE 1 TABLET BY MOUTH TWICE DAILY. 180 tablet 5  . modafinil (PROVIGIL) 100 MG tablet Take 1 tablet (100 mg total) by mouth daily. 30 tablet 5  . PRESCRIPTION MEDICATION CPAP    . SYMBICORT 80-4.5 MCG/ACT inhaler USE 2 PUFFS TWICE A DAY. 30.6 g 5  . torsemide (DEMADEX) 20 MG tablet TAKE 1 TABLET ONCE DAILY. 90 tablet 5  . VIIBRYD 40 MG TABS TAKE 1 TABLET ONCE DAILY. 90 tablet 0  . warfarin (COUMADIN) 2.5 MG  tablet TAKE 1 TABLET DAILY EXCEPT 2 TABLETS ON MONDAY, WEDNESDAY, AND FRIDAYS 40 tablet 5  . buPROPion (WELLBUTRIN XL) 300 MG 24 hr tablet TAKE 1 TABLET ONCE DAILY. 90 tablet 0  . ferrous sulfate 325 (65 FE) MG tablet Take by mouth in the morning and at bedtime.    . lamoTRIgine (LAMICTAL) 25 MG tablet TAKE 1 TABLET ONCE DAILY. 90 tablet 0  . enoxaparin (LOVENOX) 60 MG/0.6ML injection Inject 0.6 mLs (60 mg total) into the skin daily for 8 doses. 4.8 mL 0   No facility-administered medications prior to visit.    ROS Review of Systems  Constitutional: Positive for fatigue and unexpected weight change. Negative for appetite change, chills and diaphoresis.  HENT: Negative.   Eyes: Negative for visual disturbance.  Respiratory: Negative for chest tightness, shortness of breath and wheezing.   Cardiovascular: Negative for chest pain, palpitations and leg swelling.  Gastrointestinal: Negative for abdominal pain, constipation, diarrhea, nausea and vomiting.  Endocrine: Negative.   Genitourinary: Negative.  Negative for difficulty urinating.  Musculoskeletal: Negative for arthralgias and myalgias.  Skin: Negative.  Negative for color change and pallor.  Neurological: Negative.  Negative for dizziness, weakness, light-headedness and numbness.  Hematological: Negative for adenopathy. Does not bruise/bleed easily.  Psychiatric/Behavioral: Negative.     Objective:  BP 136/78  Pulse 96   Temp 97.8 F (36.6 C) (Oral)   Ht 5\' 3"  (1.6 m)   Wt 132 lb (59.9 kg)   SpO2 96%   BMI 23.38 kg/m   BP Readings from Last 3 Encounters:  11/08/20 136/78  09/14/20 (!) 106/52  08/23/20 118/62    Wt Readings from Last 3 Encounters:  11/08/20 132 lb (59.9 kg)  09/14/20 137 lb (62.1 kg)  08/23/20 137 lb (62.1 kg)    Physical Exam Vitals reviewed.  Constitutional:      General: She is not in acute distress.    Appearance: She is ill-appearing. She is not toxic-appearing or diaphoretic.  HENT:      Nose: Nose normal.     Mouth/Throat:     Mouth: Mucous membranes are moist.  Eyes:     General: No scleral icterus.    Conjunctiva/sclera: Conjunctivae normal.  Cardiovascular:     Rate and Rhythm: Normal rate and regular rhythm.     Heart sounds: Murmur heard.   Systolic murmur is present with a grade of 2/6.  No diastolic murmur is present. No gallop.   Pulmonary:     Effort: Pulmonary effort is normal.     Breath sounds: No stridor. No wheezing, rhonchi or rales.  Abdominal:     General: Abdomen is flat. Bowel sounds are normal. There is no distension.     Palpations: Abdomen is soft. There is no hepatomegaly, splenomegaly or mass.     Tenderness: There is no abdominal tenderness.  Musculoskeletal:        General: Normal range of motion.     Cervical back: Neck supple.     Right lower leg: No edema.     Left lower leg: No edema.  Lymphadenopathy:     Cervical: No cervical adenopathy.  Skin:    General: Skin is warm and dry.     Coloration: Skin is not pale.  Neurological:     General: No focal deficit present.     Mental Status: She is alert.  Psychiatric:        Mood and Affect: Mood normal.        Behavior: Behavior normal.     Lab Results  Component Value Date   WBC 5.2 11/09/2020   HGB 11.6 (L) 11/09/2020   HCT 35.3 (L) 11/09/2020   PLT 135.0 (L) 11/09/2020   GLUCOSE 79 11/09/2020   CHOL 128 07/12/2020   TRIG 53.0 07/12/2020   HDL 56.30 07/12/2020   LDLCALC 61 07/12/2020   ALT 8 08/23/2020   AST 14 08/23/2020   NA 139 11/09/2020   K 3.8 11/09/2020   CL 99 11/09/2020   CREATININE 1.90 (H) 11/09/2020   BUN 36 (H) 11/09/2020   CO2 35 (H) 11/09/2020   TSH 2.53 07/12/2020   INR 2.3 11/01/2020   HGBA1C 5.3 03/07/2020   MICROALBUR 63.9 (H) 04/20/2018    DG ESOPHAGUS W SINGLE CM (SOL OR THIN BA)  Result Date: 10/11/2020 CLINICAL DATA:  80 year old female with history of dysphagia intermittently for the past 8 years. EXAM: ESOPHOGRAM/BARIUM SWALLOW  TECHNIQUE: Single contrast examination was performed using  thin barium. FLUOROSCOPY TIME:  Fluoroscopy Time:  2.4 minutes Radiation Exposure Index (if provided by the fluoroscopic device): 13.1 mGy Number of Acquired Spot Images: 0 COMPARISON:  None. FINDINGS: Comment: Today's study is limited by patient mobility and inability to follow all commands during the examination. With the above limitations in mind, single contrast images of the esophagus  demonstrated a normal esophageal anatomy, without evidence of esophageal mass, stricture or esophageal ring. Multiple single swallow attempts demonstrated normal primary peristaltic waves. No tertiary contractions were observed. A barium tablet was administered, which passed readily into the stomach. IMPRESSION: 1. Within the limitations of today's examination, the examination was normal without explanation for patient's history of dysphagia. Electronically Signed   By: Vinnie Langton M.D.   On: 10/11/2020 11:44    Assessment & Plan:   Dejanae was seen today for anemia.  Diagnoses and all orders for this visit:  CKD (chronic kidney disease) stage 5, GFR less than 15 ml/min ([place])- Her renal function has declined slightly. -     Basic metabolic panel; Future -     Basic metabolic panel  Anemia, chronic disease- Her H&H are better.  I will screen her for vitamin deficiencies. -     CBC with Differential/Platelet; Future -     Iron; Future -     Ferritin; Future -     Vitamin B12; Future -     Vitamin B1; Future -     Folate; Future -     Folate -     CBC with Differential/Platelet -     Basic metabolic panel -     Ferritin -     Iron -     Vitamin B12 -     Vitamin B1  Iron deficiency anemia due to chronic blood loss- Her H&H have improved but her iron level is low.  I recommended that she increase the iron supplement to BID. -     CBC with Differential/Platelet; Future -     Iron; Future -     Ferritin; Future -     Folate -     CBC with  Differential/Platelet -     Ferritin -     ferrous sulfate 325 (65 FE) MG tablet; Take 1 tablet (325 mg total) by mouth 2 (two) times daily with a meal.   I have discontinued Holly Simmons's enoxaparin. I have also changed her ferrous sulfate. Additionally, I am having her maintain her fexofenadine, B-complex with vitamin C, PRESCRIPTION MEDICATION, Betamethasone Valerate, modafinil, ipratropium, ketoconazole, ciclopirox, amoxicillin, dronabinol, allopurinol, Viibryd, famotidine, Symbicort, torsemide, metoprolol tartrate, and warfarin.  Meds ordered this encounter  Medications  . ferrous sulfate 325 (65 FE) MG tablet    Sig: Take 1 tablet (325 mg total) by mouth 2 (two) times daily with a meal.    Dispense:  180 tablet    Refill:  1     Follow-up: Return in about 3 months (around 02/07/2021).  Holly Calico, MD

## 2020-11-08 NOTE — Patient Instructions (Signed)
[employer] (25th ed., pp. [phone]). [place], [place]: Elsevier.">  Anemia  Anemia is a condition in which there is not enough red blood cells or hemoglobin in the blood. Hemoglobin is a substance in red blood cells that carries oxygen. When you do not have enough red blood cells or hemoglobin (are anemic), your body cannot get enough oxygen and your organs may not work properly. As a result, you may feel very tired or have other problems. What are the causes? Common causes of anemia include:  Excessive bleeding. Anemia can be caused by excessive bleeding inside or outside the body, including bleeding from the intestines or from heavy menstrual periods in females.  Poor nutrition.  Long-lasting (chronic) kidney, thyroid, and liver disease.  Bone marrow disorders, spleen problems, and blood disorders.  Cancer and treatments for cancer.  HIV (human immunodeficiency virus) and AIDS (acquired immunodeficiency syndrome).  Infections, medicines, and autoimmune disorders that destroy red blood cells. What are the signs or symptoms? Symptoms of this condition include:  Minor weakness.  Dizziness.  Headache, or difficulties concentrating and sleeping.  Heartbeats that feel irregular or faster than normal (palpitations).  Shortness of breath, especially with exercise.  Pale skin, lips, and nails, or cold hands and feet.  Indigestion and nausea. Symptoms may occur suddenly or develop slowly. If your anemia is mild, you may not have symptoms. How is this diagnosed? This condition is diagnosed based on blood tests, your medical history, and a physical exam. In some cases, a test may be needed in which cells are removed from the soft tissue inside of a bone and looked at under a microscope (bone marrow biopsy). Your health care provider may also check your stool (feces) for blood and may do additional testing to look for the cause of your bleeding. Other tests may  include:  Imaging tests, such as a CT scan or MRI.  A procedure to see inside your esophagus and stomach (endoscopy).  A procedure to see inside your colon and rectum (colonoscopy). How is this treated? Treatment for this condition depends on the cause. If you continue to lose a lot of blood, you may need to be treated at a hospital. Treatment may include:  Taking supplements of iron, vitamin Q68, or folic acid.  Taking a hormone medicine (erythropoietin) that can help to stimulate red blood cell growth.  Having a blood transfusion. This may be needed if you lose a lot of blood.  Making changes to your diet.  Having surgery to remove your spleen. Follow these instructions at home:  Take over-the-counter and prescription medicines only as told by your health care provider.  Take supplements only as told by your health care provider.  Follow any diet instructions that you were given by your health care provider.  Keep all follow-up visits as told by your health care provider. This is important. Contact a health care provider if:  You develop new bleeding anywhere in the body. Get help right away if:  You are very weak.  You are short of breath.  You have pain in your abdomen or chest.  You are dizzy or feel faint.  You have trouble concentrating.  You have bloody stools, black stools, or tarry stools.  You vomit repeatedly or you vomit up blood. These symptoms may represent a serious problem that is an emergency. Do not wait to see if the symptoms will go away. Get medical help right away. Call your local emergency services (911 in the [place] Do not  drive yourself to the hospital. Summary  Anemia is a condition in which you do not have enough red blood cells or enough of a substance in your red blood cells that carries oxygen (hemoglobin).  Symptoms may occur suddenly or develop slowly.  If your anemia is mild, you may not have symptoms.  This condition is  diagnosed with blood tests, a medical history, and a physical exam. Other tests may be needed.  Treatment for this condition depends on the cause of the anemia. This information is not intended to replace advice given to you by your health care provider. Make sure you discuss any questions you have with your health care provider. Document Revised: 06/28/2019 Document Reviewed: 06/28/2019 Elsevier Patient Education  2021 Elsevier Inc.  

## 2020-11-09 LAB — BASIC METABOLIC PANEL
BUN: 36 mg/dL — ABNORMAL HIGH (ref 6–23)
CO2: 35 mEq/L — ABNORMAL HIGH (ref 19–32)
Calcium: 11 mg/dL — ABNORMAL HIGH (ref 8.4–10.5)
Chloride: 99 mEq/L (ref 96–112)
Creatinine, Ser: 1.9 mg/dL — ABNORMAL HIGH (ref 0.40–1.20)
GFR: 25.19 mL/min — ABNORMAL LOW (ref >60.00)
Glucose, Bld: 79 mg/dL (ref 70–99)
Potassium: 3.8 mEq/L (ref 3.5–5.1)
Sodium: 139 mEq/L (ref 135–145)

## 2020-11-09 LAB — CBC WITH DIFFERENTIAL/PLATELET
Basophils Absolute: 0 10*3/uL (ref 0.0–0.1)
Basophils Relative: 0.5 % (ref 0.0–3.0)
Eosinophils Absolute: 0.3 10*3/uL (ref 0.0–0.7)
Eosinophils Relative: 5.3 % — ABNORMAL HIGH (ref 0.0–5.0)
HCT: 35.3 % — ABNORMAL LOW (ref 36.0–46.0)
Hemoglobin: 11.6 g/dL — ABNORMAL LOW (ref 12.0–15.0)
Lymphocytes Relative: 17 % (ref 12.0–46.0)
Lymphs Abs: 0.9 10*3/uL (ref 0.7–4.0)
MCHC: 32.9 g/dL (ref 30.0–36.0)
MCV: 94.7 fl (ref 78.0–100.0)
Monocytes Absolute: 0.5 10*3/uL (ref 0.1–1.0)
Monocytes Relative: 9.2 % (ref 3.0–12.0)
Neutro Abs: 3.5 10*3/uL (ref 1.4–7.7)
Neutrophils Relative %: 68 % (ref 43.0–77.0)
Platelets: 135 10*3/uL — ABNORMAL LOW (ref 150.0–400.0)
RBC: 3.73 Mil/uL — ABNORMAL LOW (ref 3.87–5.11)
RDW: 13.8 % (ref 11.5–15.5)
WBC: 5.2 10*3/uL (ref 4.0–10.5)

## 2020-11-09 LAB — FOLATE: Folate: >23.6 ng/mL (ref >5.9)

## 2020-11-09 LAB — VITAMIN B12: Vitamin B-12: 949 pg/mL — ABNORMAL HIGH (ref 211–911)

## 2020-11-09 LAB — IRON: Iron: 30 ug/dL — ABNORMAL LOW (ref 42–145)

## 2020-11-09 LAB — FERRITIN: Ferritin: 28.8 ng/mL (ref 10.0–291.0)

## 2020-11-10 ENCOUNTER — Other Ambulatory Visit: Payer: Self-pay | Admitting: Internal Medicine

## 2020-11-10 DIAGNOSIS — F418 Other specified anxiety disorders: Secondary | ICD-10-CM

## 2020-11-10 DIAGNOSIS — F3341 Major depressive disorder, recurrent, in partial remission: Secondary | ICD-10-CM

## 2020-11-10 MED ORDER — FERROUS SULFATE 325 (65 FE) MG PO TABS: 325.0000 mg | ORAL_TABLET | Freq: Two times a day (BID) | ORAL | 1 refills | Status: DC

## 2020-11-14 ENCOUNTER — Other Ambulatory Visit: Payer: Self-pay | Admitting: Internal Medicine

## 2020-11-14 LAB — VITAMIN B1: Vitamin B1 (Thiamine): 193 nmol/L — ABNORMAL HIGH (ref 8–30)

## 2020-11-15 ENCOUNTER — Ambulatory Visit: Payer: Medicare PPO | Admitting: Internal Medicine

## 2020-11-15 ENCOUNTER — Encounter: Payer: Self-pay | Admitting: Internal Medicine

## 2020-11-15 ENCOUNTER — Ambulatory Visit: Payer: Medicare PPO | Admitting: Nurse Practitioner

## 2020-11-15 ENCOUNTER — Encounter: Payer: Self-pay | Admitting: Nurse Practitioner

## 2020-11-15 ENCOUNTER — Other Ambulatory Visit: Payer: Self-pay

## 2020-11-15 ENCOUNTER — Other Ambulatory Visit: Payer: Self-pay | Admitting: Internal Medicine

## 2020-11-15 VITALS — BP 130/70 | Temp 98.1°F | Ht 63.0 in

## 2020-11-15 VITALS — BP 130/70 | HR 72 | Ht 63.0 in | Wt 130.0 lb

## 2020-11-15 DIAGNOSIS — L820 Inflamed seborrheic keratosis: Secondary | ICD-10-CM

## 2020-11-15 DIAGNOSIS — R1310 Dysphagia, unspecified: Secondary | ICD-10-CM | POA: Diagnosis not present

## 2020-11-15 DIAGNOSIS — L989 Disorder of the skin and subcutaneous tissue, unspecified: Secondary | ICD-10-CM

## 2020-11-15 NOTE — Progress Notes (Signed)
11/15/2020 REVONDA MENTER [id number] 09/17/1940   Chief Complaint: Dysphagia   History of Present Illness: Holly Simmons. 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  Past appendectomy, cholecystectomy, tonsillectomy, breast cyst excision and C-section.  I initially saw the patient accompanied by her son in office on 08/23/2020 further evaluation regarding dysphagia and for colon polyp follow-up. Refer to 08/23/2020 consult note for comprehensive history review.  She presents today again accompanied by her son [name] with complaints of persistent dysphagia.  She underwent an EGD with Dr. Tarri Glenn 09/14/2020 identified a small hiatal hernia, gastritis, multiple fundic gland polyps and a 2 mm sessile polyp was removed from the second portion of the duodenum. Duodenal biopsies showed benign polypoid mucosa.   Five tubular adenomatous/hyperplastic polyps were removed from the cecum, hepatic flexure and sigmoid colon.  No further colonoscopies were recommended due to her age.  The EGD results did not explain her dysphagia therefore a barium swallow was ordered.  She underwent a barium swallow with tablet on 10/11/2020 which was somewhat limited due to the patient's inability to follow instructions the examination was documented as normal without identifying any explanation for her dysphagia.  She continues to have difficulty swallowing solid food.   She feels as if food gets hung up to the left side of her throat which results in coughing and the stuck food is expelled.  She stated the dysphagia episodes occur 2-3 times in 1 meal and sometimes once daily which can occur 3 to 4 days weekly and she can go 1 to 2 weeks without having any dysphagia symptoms.  She underwent a CT of the cervical spine 07/12/2020 which identified degenerative changes to the C5-C6 level without obvious nerve impingement or stenosis. She has a decreased appetite for which she was recently started on Marinol as prescribed by Dr. Ronnald Ramp she acknowledges having depression managed by Dr. Ronnald Ramp. She has lost 7lbs over the past 2 months. Her bowel movements have been fairly normal. No rectal bleeding or black stools. No other complaints at this time.   CBC Latest Ref Rng & Units 11/09/2020 08/23/2020 07/12/2020  WBC 4.0 - 10.5 K/uL 5.2 5.6 -  Hemoglobin 12.0 - 15.0 g/dL 11.6(L) 9.8(L) 11.6(L)  Hematocrit 36.0 - 46.0 % 35.3(L) 30.1(L) 34.0(L)  Platelets 150.0 - 400.0 K/uL 135.0(L) 149.0(L) -   CMP Latest Ref Rng & Units 11/09/2020 08/23/2020 07/12/2020  Glucose 70 - 99 mg/dL 79 111(H) 125(H)  BUN 6 - 23 mg/dL 36(H) 27(H) 12  Creatinine 0.40 - 1.20 mg/dL 1.90(H) 1.55(H) 1.20(H)  Sodium 135 - 145 mEq/L 139 138 141  Potassium 3.5 - 5.1 mEq/L 3.8 3.9 3.6  Chloride 96 - 112 mEq/L 99 102 100  CO2 19 - 32 mEq/L 35(H) 34(H) -  Calcium 8.4 - 10.5 mg/dL 11.0(H) 10.0 -  Total Protein 6.0 - 8.3 g/dL - 6.8 -  Total Bilirubin 0.2 - 1.2 mg/dL - 0.7 -  Alkaline Phos 39 - 117 U/L -  127(H) -  AST 0 - 37 U/L - 14 -  ALT 0 - 35 U/L - 8 -    Barium Swallow 10/11/2020: Comment: Today's study is limited by patient mobility and inability to follow all commands during the examination. With the above limitations in mind, single contrast images of the esophagus demonstrated a normal esophageal anatomy, without evidence of esophageal mass, stricture or esophageal ring. Multiple single swallow  attempts demonstrated normal primary peristaltic waves. No tertiary contractions were observed. A barium tablet was administered, which passed readily into the stomach. Within the limitations of today's examination, the examination was normal without explanation for patient's history of dysphagia. She gets choked on the left side. Coughs ups up. Bread gets stuck. Once in a while pills. Almost always food. 2 to 3 times during one meal or once daily. 3 to 4 days weekly. Random note every week. Not worse. No cervical spine dz. Decreased appetite. Depressed.   EGD 09/14/2020:  - The examined esophagus was normal. No ring, web, or stricture. No esophagitis or Barrett's esophagus. Biopsies were obtained from the proximal and distal esophagus with cold forceps for histology. Findings: - A small hiatal hernia was identified. Diffuse moderate inflammation with hemorrhage characterized by erythema, friability and granularity was found in the gastric body. Biopsies were taken from the antrum, body, and fundus with a cold forceps for histology. Estimated blood loss was minimal. - Multiple sessile polyps with no bleeding were found in the gastric fundus. Biopsies were taken with a cold forceps for histology. Estimated blood loss was minimal. - Duodenal mucosa appeared normal. Biopsies were taken with a cold forceps for histology. Estimated blood loss was minimal. - A single 2 mm sessile polyp was found in the second portion of the duodenum. The polyp was removed with a cold biopsy forceps. Resection and retrieval were complete. Estimated blood loss was minimal.  Colonoscopy 09/14/2020: - Preparation of the colon was poor. - Diverticulosis in the sigmoid colon and in the descending colon. - One 8 mm polyp in the sigmoid colon, removed with a cold snare. Resected and retrieved. - One 6 mm polyp at the hepatic flexure, removed with a cold snare. Resected and retrieved. - Three 1 to 2 mm polyps in the  cecum, removed with a cold snare. Resected and retrieved. - The examination was otherwise normal on direct and retroflexion views.  1. Surgical [P], duodenal polyp bx's - SCANT FRAGMENTS OF POLYPOID DUODENAL MUCOSA WITH NO SPECIFIC HISTOPATHOLOGIC CHANGES - NEGATIVE FOR DYSPLASIA OR MALIGNANCY 2. Surgical [P], duodenal bx's - DUODENAL MUCOSA WITH REACTIVE CHANGES - NEGATIVE FOR INCREASED INTRAEPITHELIAL LYMPHOCYTES OR VILLOUS ARCHITECTURAL CHANGES 3. Surgical [P], fundus, gastric antrum and gastric body - GASTRIC ANTRAL AND OXYNTIC MUCOSA WITH MILD NONSPECIFIC REACTIVE GASTROPATHY - WARTHIN STARRY STAIN IS NEGATIVE FOR HELICOBACTER PYLORI 4. Surgical [P], gastric polyp bx's - FUNDIC GLAND POLYP(S) - NEGATIVE FOR DYSPLASIA 5. Surgical [P], esophageal - ESOPHAGEAL SQUAMOUS AND CARDIAC MUCOSA WITH NO SPECIFIC HISTOPATHOLOGIC CHANGES - NEGATIVE FOR INTESTINAL METAPLASIA OR DYSPLASIA 6. Surgical [P], colon, cecum, hepatic flexure, 36cm, polyp (multiple) - TUBULAR ADENOMA(S) WITHOUT HIGH-GRADE DYSPLASIA OR MALIGNANCY - HYPERPLASTIC POLYP(S)   Current Outpatient Medications on File Prior to Visit  Medication Sig Dispense Refill  . allopurinol (ZYLOPRIM) 100 MG tablet TAKE 1 TABLET BY MOUTH DAILY. 90 tablet 0  . Betamethasone Valerate 0.12 % foam APPLY TO SCALP QD 100 g 2  . buPROPion (WELLBUTRIN XL) 300 MG 24 hr tablet TAKE 1 TABLET ONCE DAILY. [place]  tablet 0  . ciclopirox (LOPROX) 0.77 % SUSP APPLY 1 PUMP TWICE A DAY. 60 mL 0  . dronabinol (MARINOL) 5 MG capsule TAKE 1 CAPSULE TWICE DAILY BEFORE LUNCH AND SUPPER. 180 capsule 0  . famotidine (PEPCID) 40 MG tablet TAKE 1 TABLET ONCE DAILY. 90 tablet 0  . ferrous sulfate 325 (65 FE) MG tablet Take 1 tablet (325 mg total) by mouth 2 (two) times daily with a meal. (Patient taking differently: Take 325 mg by mouth 3 (three) times daily with meals.) 180 tablet 1  . fexofenadine (ALLEGRA) 180 MG tablet Take 180 mg by mouth daily as needed for  allergies.    Marland Kitchen ipratropium (ATROVENT) 0.03 % nasal spray USE 2 SPRAYS IN EACH NOSTRIL 3 TIMES A DAY AS NEEDED. 30 mL 0  . ketoconazole (NIZORAL) 2 % cream APPLY TO AFFECTED AREA TWICE A DAY. 60 g 0  . lamoTRIgine (LAMICTAL) 25 MG tablet TAKE 1 TABLET ONCE DAILY. 90 tablet 0  . metoprolol tartrate (LOPRESSOR) 25 MG tablet TAKE 1 TABLET BY MOUTH TWICE DAILY. 180 tablet 5  . modafinil (PROVIGIL) 100 MG tablet Take 1 tablet (100 mg total) by mouth daily. 30 tablet 5  . PRESCRIPTION MEDICATION CPAP    . SYMBICORT 80-4.5 MCG/ACT inhaler USE 2 PUFFS TWICE A DAY. 30.6 g 5  . torsemide (DEMADEX) 20 MG tablet TAKE 1 TABLET ONCE DAILY. 90 tablet 5  . VIIBRYD 40 MG TABS TAKE 1 TABLET ONCE DAILY. 90 tablet 0  . warfarin (COUMADIN) 2.5 MG tablet TAKE 1 TABLET DAILY EXCEPT 2 TABLETS ON MONDAY, WEDNESDAY, AND FRIDAYS 40 tablet 5  . amoxicillin (AMOXIL) 500 MG capsule TAKE 4 CAPSULES 1 HOUR PRIOR TO PROCEDURE (Patient not taking: Reported on 11/15/2020) 8 capsule 3  . [DISCONTINUED] Alum & Mag Hydroxide-Simeth (MAGIC MOUTHWASH) SOLN Take 5 mLs by mouth 3 (three) times daily. 14 days then stop      No current facility-administered medications on file prior to visit.   Allergies  Allergen Reactions  . Petrolatum-Zinc Oxide Anaphylaxis, Rash and Swelling  . Sulfa Antibiotics Rash  . Sulfonamide Derivatives Anaphylaxis and Swelling  . Tetanus Toxoid     Other reaction(s): Other (See Comments) OTHER REACTION  . Tetanus Toxoids Swelling  . Other Rash and Other (See Comments)    STERI - STRIPS  It can affect proteins in her kidneys so she doesn't take REACTION: proteinuria Other reaction(s): Other (See Comments), Unknown It can affect proteins in her kidneys so she doesn't take  . Nsaids Other (See Comments)    REACTION: proteinuria Other reaction(s): Other (See Comments), Unknown It can affect proteins in her kidneys so she doesn't take   . Tetanus Toxoid, Adsorbed Other (See Comments)    Other  reaction(s): Other (See Comments) Turned arm red Turned arm red    Current Medications, Allergies, Past Medical History, Past Surgical History, Family History and Social History were reviewed in [employer] record.   Review of Systems:   Constitutional: + weight loss. No fever, sweats or chills.  Respiratory: Negative for shortness of breath.   Cardiovascular: Negative for chest pain, palpitations and leg swelling.  Gastrointestinal: See HPI.  Musculoskeletal: Negative for back pain or muscle aches.  Neurological: Negative for dizziness, headaches or paresthesias.    Physical Exam: BP 130/70 (BP Location: Left Arm, Patient Position: Sitting, Cuff Size: Normal)   Pulse 72   Ht 5\' 3"  (1.6 m) Comment: height measured without shoes  Wt 130 lb (59  kg)   BMI 23.03 kg/m    Wt Readings from Last 3 Encounters:  11/15/20 130 lb (59 kg)  11/08/20 132 lb (59.9 kg)  09/14/20 137 lb (62.1 kg)   General: Elderly 80 year old female in no acute distress. Head: Normocephalic and atraumatic. Left forehead bandage intact s/p nevi removal earlier today.  Eyes: No scleral icterus. Conjunctiva pink . Ears: Normal auditory acuity. Lungs: Clear throughout to auscultation. Heart: Regular rate and rhythm, no murmur. Abdomen: Soft, nontender and nondistended. No masses or hepatomegaly. Normal bowel sounds x 4 quadrants.  Rectal: Deferred.  Musculoskeletal: Symmetrical with no gross deformities. Extremities: No edema. Neurological: Alert oriented x 4. No focal deficits.  Psychological: Alert and cooperative. Normal mood and affect  Assessment and Recommendations:  38.  80 year old female with oral phase dysphagia of unclear etiology.  EGD and barium swallow study with tablet did not explain her dysphagia. -I recommended a modified swallowing study with speech pathology, the patient and her son wish to discuss further with Dr. Ronnald Ramp prior to ordering this study -Consider ENT  evaluation if modified swallowing study pursued and negative -Patient instructed to drink 3 separate sips of tap water prior to swallowing any pills or food, cut food in small pieces and chew food thoroughly -Patient or son will call our office if symptoms worsen  2.  Tubular adenomatous and hyperplastic polyps per colonoscopy -No further colonoscopies recommended due to age less medically necessary  3.  Normocytic anemia, chronic. Multifactorial: CKD. No overt GI bleeding.  -Follow up CBC with PCP

## 2020-11-15 NOTE — Progress Notes (Signed)
Subjective:  Patient ID: DEZYRE HOEFER, female    DOB: 31-Jul-1941  Age: 80 y.o. MRN: [id number]  CC: Follow-up  This visit occurred during the SARS-CoV-2 public health emergency.  Safety protocols were in place, including screening questions prior to the visit, additional usage of staff PPE, and extensive cleaning of exam room while observing appropriate contact time as indicated for disinfecting solutions.    HPI SEENA RITACCO presents for f/up -   She has a growing lesion on her left forehead that she wants removed.  It occasionally itches but never bleeds.  Outpatient Medications Prior to Visit  Medication Sig Dispense Refill  . allopurinol (ZYLOPRIM) 100 MG tablet TAKE 1 TABLET BY MOUTH DAILY. 90 tablet 0  . amoxicillin (AMOXIL) 500 MG capsule TAKE 4 CAPSULES 1 HOUR PRIOR TO PROCEDURE (Patient not taking: Reported on 11/15/2020) 8 capsule 3  . Betamethasone Valerate 0.12 % foam APPLY TO SCALP QD 100 g 2  . buPROPion (WELLBUTRIN XL) 300 MG 24 hr tablet TAKE 1 TABLET ONCE DAILY. 90 tablet 0  . ciclopirox (LOPROX) 0.77 % SUSP APPLY 1 PUMP TWICE A DAY. 60 mL 0  . dronabinol (MARINOL) 5 MG capsule TAKE 1 CAPSULE TWICE DAILY BEFORE LUNCH AND SUPPER. 180 capsule 0  . famotidine (PEPCID) 40 MG tablet TAKE 1 TABLET ONCE DAILY. 90 tablet 0  . ferrous sulfate 325 (65 FE) MG tablet Take 1 tablet (325 mg total) by mouth 2 (two) times daily with a meal. (Patient taking differently: Take 325 mg by mouth 3 (three) times daily with meals.) 180 tablet 1  . fexofenadine (ALLEGRA) 180 MG tablet Take 180 mg by mouth daily as needed for allergies.    Marland Kitchen ipratropium (ATROVENT) 0.03 % nasal spray USE 2 SPRAYS IN EACH NOSTRIL 3 TIMES A DAY AS NEEDED. 30 mL 0  . ketoconazole (NIZORAL) 2 % cream APPLY TO AFFECTED AREA TWICE A DAY. 60 g 0  . lamoTRIgine (LAMICTAL) 25 MG tablet TAKE 1 TABLET ONCE DAILY. 90 tablet 0  . metoprolol tartrate (LOPRESSOR) 25 MG tablet TAKE 1 TABLET BY MOUTH TWICE DAILY.  180 tablet 5  . modafinil (PROVIGIL) 100 MG tablet Take 1 tablet (100 mg total) by mouth daily. 30 tablet 5  . PRESCRIPTION MEDICATION CPAP    . SYMBICORT 80-4.5 MCG/ACT inhaler USE 2 PUFFS TWICE A DAY. 30.6 g 5  . torsemide (DEMADEX) 20 MG tablet TAKE 1 TABLET ONCE DAILY. 90 tablet 5  . VIIBRYD 40 MG TABS TAKE 1 TABLET ONCE DAILY. 90 tablet 0  . warfarin (COUMADIN) 2.5 MG tablet TAKE 1 TABLET DAILY EXCEPT 2 TABLETS ON MONDAY, WEDNESDAY, AND FRIDAYS 40 tablet 5  . B Complex-C (B-COMPLEX WITH VITAMIN C) tablet Take 1 tablet by mouth daily.     No facility-administered medications prior to visit.    ROS Review of Systems  All other systems reviewed and are negative.   Objective:  Ht 5\' 3"  (1.6 m)   BMI 23.38 kg/m   BP Readings from Last 3 Encounters:  11/15/20 130/70  11/08/20 136/78  09/14/20 (!) 106/52    Wt Readings from Last 3 Encounters:  11/15/20 130 lb (59 kg)  11/08/20 132 lb (59.9 kg)  09/14/20 137 lb (62.1 kg)    Physical Exam Vitals reviewed.  HENT:     Head:      Lab Results  Component Value Date   WBC 5.2 11/09/2020   HGB 11.6 (L) 11/09/2020   HCT 35.3 (L) 11/09/2020  PLT 135.0 (L) 11/09/2020   GLUCOSE 79 11/09/2020   CHOL 128 07/12/2020   TRIG 53.0 07/12/2020   HDL 56.30 07/12/2020   LDLCALC 61 07/12/2020   ALT 8 08/23/2020   AST 14 08/23/2020   NA 139 11/09/2020   K 3.8 11/09/2020   CL 99 11/09/2020   CREATININE 1.90 (H) 11/09/2020   BUN 36 (H) 11/09/2020   CO2 35 (H) 11/09/2020   TSH 2.53 07/12/2020   INR 2.3 11/01/2020   HGBA1C 5.3 03/07/2020   MICROALBUR 63.9 (H) 04/20/2018    DG ESOPHAGUS W SINGLE CM (SOL OR THIN BA)  Result Date: 10/11/2020 CLINICAL DATA:  80 year old female with history of dysphagia intermittently for the past 8 years. EXAM: ESOPHOGRAM/BARIUM SWALLOW TECHNIQUE: Single contrast examination was performed using  thin barium. FLUOROSCOPY TIME:  Fluoroscopy Time:  2.4 minutes Radiation Exposure Index (if provided by  the fluoroscopic device): 13.1 mGy Number of Acquired Spot Images: 0 COMPARISON:  None. FINDINGS: Comment: Today's study is limited by patient mobility and inability to follow all commands during the examination. With the above limitations in mind, single contrast images of the esophagus demonstrated a normal esophageal anatomy, without evidence of esophageal mass, stricture or esophageal ring. Multiple single swallow attempts demonstrated normal primary peristaltic waves. No tertiary contractions were observed. A barium tablet was administered, which passed readily into the stomach. IMPRESSION: 1. Within the limitations of today's examination, the examination was normal without explanation for patient's history of dysphagia. Electronically Signed   By: Vinnie Langton M.D.   On: 10/11/2020 11:44    After informed verbal consent was obtained, using Betadine for cleansing and 2% Lidocaine with epinephrine for anesthetic (2 cc's used). With sterile technique a 6 mm punch incision biopsy was used to obtain a biopsy specimen of the lesion. Hemostasis was obtained by pressure and the wound was sutured (2 simple interrupted sutures 4.0 nylon). Antibiotic dressing was applied. Wound care instructions provided. Be alert for any signs of cutaneous infection. The specimen is labeled and sent to pathology for evaluation. The procedure was well tolerated without complications.  Assessment & Plan:   Vina was seen today for follow-up.  Diagnoses and all orders for this visit:  Skin lesion of face- I await the biopsy results to determine if this is benign or malignant.  If it is malignant I think I probably got the margins.  She will follow-up in 7 to 10 days to have the sutures removed. -     Dermatology pathology; Future -     Dermatology pathology   I am having Iridian L. Saxby maintain her fexofenadine, PRESCRIPTION MEDICATION, Betamethasone Valerate, modafinil, ipratropium, ketoconazole, ciclopirox,  amoxicillin, dronabinol, allopurinol, Viibryd, famotidine, Symbicort, torsemide, metoprolol tartrate, warfarin, ferrous sulfate, lamoTRIgine, and buPROPion.  No orders of the defined types were placed in this encounter.    Follow-up: Return in about 1 week (around 11/22/2020).  Scarlette Calico, MD

## 2020-11-15 NOTE — Patient Instructions (Signed)
  Skin Biopsy, Care After This sheet gives you information about how to care for yourself after your procedure. Your health care provider may also give you more specific instructions. If you have problems or questions, contact your health care provider. What can I expect after the procedure? After the procedure, it is common to have:  Soreness.  Bruising.  Itching. Follow these instructions at home: Biopsy site care Follow instructions from your health care provider about how to take care of your biopsy site. Make sure you:  Wash your hands with soap and water before and after you change your bandage (dressing). If soap and water are not available, use hand sanitizer.  Apply ointment on your biopsy site as directed by your health care provider.  Change your dressing as told by your health care provider.  Leave stitches (sutures), skin glue, or adhesive strips in place. These skin closures may need to stay in place for 2 weeks or longer. If adhesive strip edges start to loosen and curl up, you may trim the loose edges. Do not remove adhesive strips completely unless your health care provider tells you to do that.  If the biopsy area bleeds, apply gentle pressure for 10 minutes. Check your biopsy site every day for signs of infection. Check for:  Redness, swelling, or pain.  Fluid or blood.  Warmth.  Pus or a bad smell.   General instructions  Rest and then return to your normal activities as told by your health care provider.  Take over-the-counter and prescription medicines only as told by your health care provider.  Keep all follow-up visits as told by your health care provider. This is important. Contact a health care provider if:  You have redness, swelling, or pain around your biopsy site.  You have fluid or blood coming from your biopsy site.  Your biopsy site feels warm to the touch.  You have pus or a bad smell coming from your biopsy site.  You have a  fever.  Your sutures, skin glue, or adhesive strips loosen or come off sooner than expected. Get help right away if:  You have bleeding that does not stop with pressure or a dressing. Summary  After the procedure, it is common to have soreness, bruising, and itching at the site.  Follow instructions from your health care provider about how to take care of your biopsy site.  Check your biopsy site every day for signs of infection.  Contact a health care provider if you have redness, swelling, or pain around your biopsy site, or your biopsy site feels warm to the touch.  Keep all follow-up visits as told by your health care provider. This is important. This information is not intended to replace advice given to you by your health care provider. Make sure you discuss any questions you have with your health care provider. Document Revised: 01/18/2018 Document Reviewed: 01/18/2018 Elsevier Patient Education  2021 Elsevier Inc.  

## 2020-11-15 NOTE — Patient Instructions (Signed)
If you are age 78 or older, your body mass index should be between 23-30. Your Body mass index is 23.03 kg/m. If this is out of the aforementioned range listed, please consider follow up with your Primary Care Provider.  RECOMMENDATIONS: Swallow study with a speech pathologist. After you consult with your primary care doctor please let us know if you would like to proceed with the testing.  Please call our office if your symptoms worsen.  It was great seeing you today! Thank you for entrusting me with your care and choosing [facility].  Noralyn Pick, CRNP

## 2020-11-18 ENCOUNTER — Encounter: Payer: Self-pay | Admitting: Internal Medicine

## 2020-11-18 NOTE — Progress Notes (Signed)
Reviewed.  Aniqa Hare L. Amaad Byers, MD, MPH  

## 2020-11-22 ENCOUNTER — Other Ambulatory Visit: Payer: Self-pay

## 2020-11-22 ENCOUNTER — Ambulatory Visit: Payer: Medicare PPO | Admitting: Internal Medicine

## 2020-11-22 ENCOUNTER — Encounter: Payer: Self-pay | Admitting: Internal Medicine

## 2020-11-22 VITALS — BP 120/66 | HR 90 | Temp 98.6°F | Resp 16 | Ht 63.0 in | Wt 136.0 lb

## 2020-11-22 DIAGNOSIS — H5340 Unspecified visual field defects: Secondary | ICD-10-CM | POA: Diagnosis not present

## 2020-11-22 DIAGNOSIS — Z5189 Encounter for other specified aftercare: Secondary | ICD-10-CM | POA: Diagnosis not present

## 2020-11-22 DIAGNOSIS — D638 Anemia in other chronic diseases classified elsewhere: Secondary | ICD-10-CM

## 2020-11-22 DIAGNOSIS — D696 Thrombocytopenia, unspecified: Secondary | ICD-10-CM

## 2020-11-22 DIAGNOSIS — Z7901 Long term (current) use of anticoagulants: Secondary | ICD-10-CM

## 2020-11-22 DIAGNOSIS — R238 Other skin changes: Secondary | ICD-10-CM

## 2020-11-22 DIAGNOSIS — R233 Spontaneous ecchymoses: Secondary | ICD-10-CM | POA: Insufficient documentation

## 2020-11-22 NOTE — Progress Notes (Signed)
Subjective:  Patient ID: Holly Simmons, female    DOB: 1941/07/27  Age: 80 y.o. MRN: [id number]  CC: Follow-up and Anemia  This visit occurred during the SARS-CoV-2 public health emergency.  Safety protocols were in place, including screening questions prior to the visit, additional usage of staff PPE, and extensive cleaning of exam room while observing appropriate contact time as indicated for disinfecting solutions.    HPI Holly Simmons presents for f/up -   She returns to have a suture removal from her left frontal scalp.  She tells me that the night after she underwent a biopsy she started taking her Coumadin again and later that night developed some asymptomatic bruising that spread down the left side of her face.  She also noted some asymptomatic bruising on her lower extremities.  She denies any sources of bleeding.  She says the biopsy site is healing nicely with no pain or swelling.  Outpatient Medications Prior to Visit  Medication Sig Dispense Refill  . allopurinol (ZYLOPRIM) 100 MG tablet TAKE 1 TABLET BY MOUTH DAILY. 90 tablet 0  . amoxicillin (AMOXIL) 500 MG capsule TAKE 4 CAPSULES 1 HOUR PRIOR TO PROCEDURE 8 capsule 3  . Betamethasone Valerate 0.12 % foam APPLY TO SCALP QD 100 g 2  . buPROPion (WELLBUTRIN XL) 300 MG 24 hr tablet TAKE 1 TABLET ONCE DAILY. 90 tablet 0  . ciclopirox (LOPROX) 0.77 % SUSP APPLY 1 PUMP TWICE A DAY. 60 mL 0  . dronabinol (MARINOL) 5 MG capsule TAKE 1 CAPSULE TWICE DAILY BEFORE LUNCH AND SUPPER. 180 capsule 0  . famotidine (PEPCID) 40 MG tablet TAKE 1 TABLET ONCE DAILY. 90 tablet 0  . ferrous sulfate 325 (65 FE) MG tablet Take 1 tablet (325 mg total) by mouth 2 (two) times daily with a meal. (Patient taking differently: Take 325 mg by mouth 3 (three) times daily with meals.) 180 tablet 1  . fexofenadine (ALLEGRA) 180 MG tablet Take 180 mg by mouth daily as needed for allergies.    Marland Kitchen ipratropium (ATROVENT) 0.03 % nasal spray USE 2  SPRAYS IN EACH NOSTRIL 3 TIMES A DAY AS NEEDED. 30 mL 0  . ketoconazole (NIZORAL) 2 % cream APPLY TO AFFECTED AREA TWICE A DAY. 60 g 0  . lamoTRIgine (LAMICTAL) 25 MG tablet TAKE 1 TABLET ONCE DAILY. 90 tablet 0  . metoprolol tartrate (LOPRESSOR) 25 MG tablet TAKE 1 TABLET BY MOUTH TWICE DAILY. 180 tablet 5  . modafinil (PROVIGIL) 100 MG tablet Take 1 tablet (100 mg total) by mouth daily. 30 tablet 5  . PRESCRIPTION MEDICATION CPAP    . SYMBICORT 80-4.5 MCG/ACT inhaler USE 2 PUFFS TWICE A DAY. 30.6 g 5  . torsemide (DEMADEX) 20 MG tablet TAKE 1 TABLET ONCE DAILY. 90 tablet 5  . VIIBRYD 40 MG TABS TAKE 1 TABLET ONCE DAILY. 90 tablet 0  . warfarin (COUMADIN) 2.5 MG tablet TAKE 1 TABLET DAILY EXCEPT 2 TABLETS ON MONDAY, WEDNESDAY, AND FRIDAYS 40 tablet 5   No facility-administered medications prior to visit.    ROS Review of Systems  Constitutional: Negative.  Negative for appetite change and fatigue.  HENT: Negative.   Eyes: Negative.   Respiratory: Negative for cough, chest tightness, shortness of breath and wheezing.   Gastrointestinal: Negative for abdominal pain, anal bleeding, blood in stool, constipation, diarrhea, nausea and vomiting.  Endocrine: Negative.  Negative for polyuria.  Genitourinary: Negative.  Negative for dysuria, hematuria and urgency.  Musculoskeletal: Negative.  Negative for back  pain.  Skin: Positive for wound.  Neurological: Negative.  Negative for dizziness, weakness, light-headedness and headaches.  Hematological: Negative for adenopathy. Bruises/bleeds easily.  Psychiatric/Behavioral: Negative.     Objective:  BP 120/66   Pulse 90   Temp 98.6 F (37 C) (Oral)   Resp 16   Ht 5\' 3"  (1.6 m)   Wt 136 lb (61.7 kg)   SpO2 94%   BMI 24.09 kg/m   BP Readings from Last 3 Encounters:  11/22/20 120/66  11/15/20 130/70  11/15/20 130/70    Wt Readings from Last 3 Encounters:  11/22/20 136 lb (61.7 kg)  11/15/20 130 lb (59 kg)  11/08/20 132 lb (59.9 kg)       Physical Exam Vitals reviewed.  HENT:     Head:     Comments: Biopsy site on the left frontal scalp is healed nicely.  There is mild swelling with no erythema, tenderness, induration, or fluctuance.  2 sutures removed.  The site is closed nicely.  There is ecchymosis that extends from the left frontal scalp onto the left face.  There are also benign-appearing ecchymoses on the lower extremities.    Nose: Nose normal.     Mouth/Throat:     Mouth: Mucous membranes are moist.  Eyes:     General: No scleral icterus.    Conjunctiva/sclera: Conjunctivae normal.  Cardiovascular:     Rate and Rhythm: Normal rate and regular rhythm.     Heart sounds: Murmur heard.    Pulmonary:     Effort: Pulmonary effort is normal.     Breath sounds: No stridor. No wheezing, rhonchi or rales.  Abdominal:     General: Abdomen is flat. There is no distension.     Palpations: Abdomen is soft. There is no hepatomegaly, splenomegaly or mass.  Musculoskeletal:     Cervical back: Neck supple.     Right lower leg: No edema.     Left lower leg: No edema.  Lymphadenopathy:     Cervical: No cervical adenopathy.  Skin:    Coloration: Skin is not jaundiced.     Findings: Bruising and ecchymosis present. No petechiae or rash.  Neurological:     General: No focal deficit present.     Mental Status: She is alert.     Lab Results  Component Value Date   WBC 5.5 11/22/2020   HGB 11.6 (L) 11/22/2020   HCT 34.9 (L) 11/22/2020   PLT 143.0 (L) 11/22/2020   GLUCOSE 79 11/09/2020   CHOL 128 07/12/2020   TRIG 53.0 07/12/2020   HDL 56.30 07/12/2020   LDLCALC 61 07/12/2020   ALT 8 08/23/2020   AST 14 08/23/2020   NA 139 11/09/2020   K 3.8 11/09/2020   CL 99 11/09/2020   CREATININE 1.90 (H) 11/09/2020   BUN 36 (H) 11/09/2020   CO2 35 (H) 11/09/2020   TSH 2.53 07/12/2020   INR 2.5 (H) 11/22/2020   HGBA1C 5.3 03/07/2020   MICROALBUR 63.9 (H) 04/20/2018    DG ESOPHAGUS W SINGLE CM (SOL OR THIN  BA)  Result Date: 10/11/2020 CLINICAL DATA:  80 year old female with history of dysphagia intermittently for the past 8 years. EXAM: ESOPHOGRAM/BARIUM SWALLOW TECHNIQUE: Single contrast examination was performed using  thin barium. FLUOROSCOPY TIME:  Fluoroscopy Time:  2.4 minutes Radiation Exposure Index (if provided by the fluoroscopic device): 13.1 mGy Number of Acquired Spot Images: 0 COMPARISON:  None. FINDINGS: Comment: Today's study is limited by patient mobility and inability to follow  all commands during the examination. With the above limitations in mind, single contrast images of the esophagus demonstrated a normal esophageal anatomy, without evidence of esophageal mass, stricture or esophageal ring. Multiple single swallow attempts demonstrated normal primary peristaltic waves. No tertiary contractions were observed. A barium tablet was administered, which passed readily into the stomach. IMPRESSION: 1. Within the limitations of today's examination, the examination was normal without explanation for patient's history of dysphagia. Electronically Signed   By: Vinnie Langton M.D.   On: 10/11/2020 11:44    Diagnosis Skin (M), forehead, excision SEBORRHEIC KERATOSIS, IRRITATED, MARGINS FREE   Assessment & Plan:   Swetha was seen today for follow-up and anemia.  Diagnoses and all orders for this visit:  Visit for wound check- The site has healed nicely.  The sutures have been removed.  Anemia, chronic disease-her H&H are stable. -     CBC with Differential/Platelet; Future -     CBC with Differential/Platelet  Easy bruising- This amount of bruising is expected considering the biopsy and the history of thrombocytopenia and anticoagulation. -     CBC with Differential/Platelet; Future -     Protime-INR; Future -     APTT; Future -     APTT -     Protime-INR -     CBC with Differential/Platelet  Thrombocytopenia ([place])- Her platelet count is stable. -     CBC with  Differential/Platelet; Future -     CBC with Differential/Platelet  Long term (current) use of anticoagulants- Her INR is therapeutic at 2.5. -     Protime-INR; Future -     APTT; Future -     APTT -     Protime-INR   I am having Dallys L. [place] maintain her fexofenadine, PRESCRIPTION MEDICATION, Betamethasone Valerate, modafinil, ipratropium, ketoconazole, ciclopirox, amoxicillin, dronabinol, allopurinol, Viibryd, famotidine, Symbicort, torsemide, metoprolol tartrate, warfarin, ferrous sulfate, lamoTRIgine, and buPROPion.  No orders of the defined types were placed in this encounter.    Follow-up: Return in about 3 months (around 02/21/2021).  Scarlette Calico, MD

## 2020-11-22 NOTE — Patient Instructions (Signed)
Wound Care, Adult Taking care of your wound properly can help to prevent pain, infection, and scarring. It can also help your wound heal more quickly. Follow instructions from your health care provider about how to care for your wound. Supplies needed:  Soap and water.  Wound cleanser.  Gauze.  If needed, a clean bandage (dressing) or other type of wound dressing material to cover or place in the wound. Follow your health care provider's instructions about what dressing supplies to use.  Cream or ointment to apply to the wound, if told by your health care provider. How to care for your wound Cleaning the wound Ask your health care provider how to clean the wound. This may include:  Using mild soap and water or a wound cleanser.  Using a clean gauze to pat the wound dry after cleaning it. Do not rub or scrub the wound. Dressing care  Wash your hands with soap and water for at least 20 seconds before and after you change the dressing. If soap and water are not available, use hand sanitizer.  Change your dressing as told by your health care provider. This may include: ? Cleaning or rinsing out (irrigating) the wound. ? Placing a dressing over the wound or in the wound (packing). ? Covering the wound with an outer dressing.  Leave any stitches (sutures), skin glue, or adhesive strips in place. These skin closures may need to stay in place for 2 weeks or longer. If adhesive strip edges start to loosen and curl up, you may trim the loose edges. Do not remove adhesive strips completely unless your health care provider tells you to do that.  Ask your health care provider when you can leave the wound uncovered. Checking for infection Check your wound area every day for signs of infection. Check for:  More redness, swelling, or pain.  Fluid or blood.  Warmth.  Pus or a bad smell.   Follow these instructions at home Medicines  If you were prescribed an antibiotic medicine, cream, or  ointment, take or apply it as told by your health care provider. Do not stop using the antibiotic even if your condition improves.  If you were prescribed pain medicine, take it 30 minutes before you do any wound care or as told by your health care provider.  Take over-the-counter and prescription medicines only as told by your health care provider. Eating and drinking  Eat a diet that includes protein, vitamin A, vitamin C, and other nutrient-rich foods to help the wound heal. ? Foods rich in protein include meat, fish, eggs, dairy, beans, and nuts. ? Foods rich in vitamin A include carrots and dark green, leafy vegetables. ? Foods rich in vitamin C include citrus fruits, tomatoes, broccoli, and peppers.  Drink enough fluid to keep your urine pale yellow. General instructions  Do not take baths, swim, use a hot tub, or do anything that would put the wound underwater until your health care provider approves. Ask your health care provider if you may take showers. You may only be allowed to take sponge baths.  Do not scratch or pick at the wound. Keep it covered as told by your health care provider.  Return to your normal activities as told by your health care provider. Ask your health care provider what activities are safe for you.  Protect your wound from the sun when you are outside for the first 6 months, or for as long as told by your health care provider. Cover   up the scar area or apply sunscreen that has an SPF of at least 30.  Do not use any products that contain nicotine or tobacco, such as cigarettes, e-cigarettes, and chewing tobacco. These may delay wound healing. If you need help quitting, ask your health care provider.  Keep all follow-up visits as told by your health care provider. This is important. Contact a health care provider if:  You received a tetanus shot and you have swelling, severe pain, redness, or bleeding at the injection site.  Your pain is not controlled  with medicine.  You have any of these signs of infection: ? More redness, swelling, or pain around the wound. ? Fluid or blood coming from the wound. ? Warmth coming from the wound. ? Pus or a bad smell coming from the wound. ? A fever or chills.  You are nauseous or you vomit.  You are dizzy. Get help right away if:  You have a red streak of skin near the area around your wound.  Your wound has been closed with staples, sutures, skin glue, or adhesive strips and it begins to open up and separate.  Your wound is bleeding, and the bleeding does not stop with gentle pressure.  You have a rash.  You faint.  You have trouble breathing. These symptoms may represent a serious problem that is an emergency. Do not wait to see if the symptoms will go away. Get medical help right away. Call your local emergency services (911 in the [place] Do not drive yourself to the hospital. Summary  Always wash your hands with soap and water for at least 20 seconds before and after changing your dressing.  Change your dressing as told by your health care provider.  To help with healing, eat foods that are rich in protein, vitamin A, vitamin C, and other nutrients.  Check your wound every day for signs of infection. Contact your health care provider if you suspect that your wound is infected. This information is not intended to replace advice given to you by your health care provider. Make sure you discuss any questions you have with your health care provider. Document Revised: 05/06/2019 Document Reviewed: 05/06/2019 Elsevier Patient Education  2021 Elsevier Inc.  

## 2020-11-23 LAB — PROTIME-INR
INR: 2.5 ratio — ABNORMAL HIGH (ref 0.8–1.0)
Prothrombin Time: 27.4 s — ABNORMAL HIGH (ref 9.6–13.1)

## 2020-11-23 LAB — CBC WITH DIFFERENTIAL/PLATELET
Basophils Absolute: 0.1 10*3/uL (ref 0.0–0.1)
Basophils Relative: 1 % (ref 0.0–3.0)
Eosinophils Absolute: 0.2 10*3/uL (ref 0.0–0.7)
Eosinophils Relative: 4.5 % (ref 0.0–5.0)
HCT: 34.9 % — ABNORMAL LOW (ref 36.0–46.0)
Hemoglobin: 11.6 g/dL — ABNORMAL LOW (ref 12.0–15.0)
Lymphocytes Relative: 16.3 % (ref 12.0–46.0)
Lymphs Abs: 0.9 10*3/uL (ref 0.7–4.0)
MCHC: 33.3 g/dL (ref 30.0–36.0)
MCV: 94.4 fl (ref 78.0–100.0)
Monocytes Absolute: 0.6 10*3/uL (ref 0.1–1.0)
Monocytes Relative: 10.1 % (ref 3.0–12.0)
Neutro Abs: 3.8 10*3/uL (ref 1.4–7.7)
Neutrophils Relative %: 68.1 % (ref 43.0–77.0)
Platelets: 143 10*3/uL — ABNORMAL LOW (ref 150.0–400.0)
RBC: 3.7 Mil/uL — ABNORMAL LOW (ref 3.87–5.11)
RDW: 13.7 % (ref 11.5–15.5)
WBC: 5.5 10*3/uL (ref 4.0–10.5)

## 2020-11-23 LAB — APTT: aPTT: 46.3 s — ABNORMAL HIGH (ref 23.4–32.7)

## 2020-11-28 ENCOUNTER — Ambulatory Visit: Payer: Medicare PPO | Admitting: Internal Medicine

## 2020-11-29 NOTE — Progress Notes (Signed)
Virtual Visit via Video Note   This visit type was conducted due to national recommendations for restrictions regarding the COVID-19 Pandemic (e.g. social distancing) in an effort to limit this patient's exposure and mitigate transmission in our community.  Due to her co-morbid illnesses, this patient is at least at moderate risk for complications without adequate follow up.  This format is felt to be most appropriate for this patient at this time.  All issues noted in this document were discussed and addressed.  A limited physical exam was performed with this format.  Please refer to the patient's chart for her consent to telehealth for [facility].   Patient Location: Home Physician Location: Office  [name]. Multiple complications with surgery at [facility] 06/23/11 by Dr Holly Simmons. 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 VSD closed no SAM Mild MR Mild TR Mild AR/AS   Echo10/21/21 reviewed:  Low normal EF moderate MR/mild MS , normal biophrosthetic TVR , VSD closed Mild AS/AR  She is seeing [facility] for CRF and has some chronic LE edema  She has received 3 Moderna vaccines Recent dermatologic surgery right front scalp healing  She has had some dysphagia and had normal barium swallow 2020/10/13   Husband died of kidney failure and some sort of paralysis  07/30/20. They had been married 20 years   ROS: Denies fever, malais, weight loss, blurry vision, decreased visual acuity, cough, sputum, SOB, hemoptysis, pleuritic pain, palpitaitons, heartburn, abdominal pain, melena, lower extremity edema, claudication, or rash.  All other systems reviewed and negative  General: Ht 5\' 3"   (1.6 m)   Wt 61.7 kg   BMI 24.09 kg/m   No distress No JVP visible  No tachypnea      Current Outpatient Medications  Medication Sig Dispense Refill  . allopurinol (ZYLOPRIM) 100 MG tablet TAKE 1 TABLET BY MOUTH DAILY. 90 tablet 0  . amoxicillin (AMOXIL) 500 MG capsule TAKE 4 CAPSULES 1 HOUR PRIOR TO PROCEDURE 8 capsule 3  . Betamethasone Valerate 0.12 % foam APPLY TO SCALP QD 100 g 2  . buPROPion (WELLBUTRIN XL) 300 MG 24 hr tablet TAKE 1 TABLET ONCE DAILY. 90 tablet 0  . ciclopirox (LOPROX) 0.77 % SUSP APPLY 1 PUMP TWICE A DAY. 60 mL 0  . dronabinol (MARINOL) 5 MG capsule TAKE 1 CAPSULE TWICE DAILY BEFORE LUNCH AND SUPPER. 180 capsule 0  . famotidine (PEPCID) 40 MG tablet TAKE 1 TABLET ONCE DAILY. 90 tablet 0  . ferrous sulfate 325 (65 FE) MG tablet Take 1 tablet (325 mg total) by mouth 2 (two) times daily with a meal. (Patient taking differently: Take 325 mg by mouth 3 (three) times daily with meals.) 180 tablet 1  . fexofenadine (ALLEGRA) 180 MG tablet Take 180 mg by mouth daily as needed for allergies.    Marland Kitchen ipratropium (ATROVENT) 0.03 % nasal spray USE 2 SPRAYS IN EACH NOSTRIL 3 TIMES A DAY AS NEEDED. 30 mL 0  . ketoconazole (NIZORAL) 2 % cream APPLY TO AFFECTED AREA TWICE A DAY. 60 g 0  . lamoTRIgine (LAMICTAL) 25 MG tablet TAKE 1 TABLET ONCE DAILY. 90 tablet 0  . metoprolol tartrate (LOPRESSOR) 25 MG tablet TAKE  1 TABLET BY MOUTH TWICE DAILY. 180 tablet 5  . modafinil (PROVIGIL) 100 MG tablet Take 1 tablet (100 mg total) by mouth daily. 30 tablet 5  . PRESCRIPTION MEDICATION CPAP    . SYMBICORT 80-4.5 MCG/ACT inhaler USE 2 PUFFS TWICE A DAY. 30.6 g 5  . torsemide (DEMADEX) 20 MG tablet TAKE 1 TABLET ONCE DAILY. 90 tablet 5  . VIIBRYD 40 MG TABS TAKE 1 TABLET ONCE DAILY. 90 tablet 0  . warfarin (COUMADIN) 2.5 MG tablet TAKE 1 TABLET DAILY EXCEPT 2 TABLETS ON MONDAY, WEDNESDAY, AND FRIDAYS 40 tablet 5   No current facility-administered medications for this visit.     Allergies  Petrolatum-zinc oxide; Sulfa antibiotics; Sulfonamide derivatives; Tetanus toxoid; Tetanus toxoids; Other; Nsaids; and Tetanus toxoid, adsorbed  Electrocardiogram:  F.utter with pacing RBBB   Assessment and Plan  HOCM: post myectomy no residual gradients  VSD now sealed with no VSD murmur on exam  Pacer  Thresholds ok f/u [facility] ? Changed to VVIR mode CS pacer Is under left clavicle and she has a 2nd abdominal device programmed as VVI backup  TV replacement  SBE low diastolic gradients and mild residual TR  Anticoagulation  Chronic afib INR Rx on coumadin no bleeding issues  A/CRF:  Baseline Cr around 1.6 f/u [facility]  Thrombocytopenia:  PLT 143 stable will need close watching on coumadin   F/U with EP check pacer in 6 months F/U with me in a year   Time spent reviewing notes from [place], echos [employer] forms direct patient interview and composing note 30 minutes   Holly Simmons

## 2020-12-02 ENCOUNTER — Encounter: Payer: Self-pay | Admitting: Internal Medicine

## 2020-12-03 NOTE — Progress Notes (Signed)
Subjective:    Patient ID: Holly Simmons, female    DOB: 1940-08-27, 80 y.o.   MRN: [id number]  HPI The patient is here for an acute visit for ? Cyst on face.     Medications and allergies reviewed with patient and updated if appropriate.  Patient Active Problem List   Diagnosis Date Noted  . Visit for wound check 11/22/2020  . Easy bruising 11/22/2020  . Bilateral leg edema 07/12/2020  . Encounter for general adult medical examination with abnormal findings 07/12/2020  . Pharyngeal dysphagia 07/12/2020  . Cachexia ([place]) 08/16/2019  . Iron deficiency anemia due to chronic blood loss 09/23/2018  . CKD (chronic kidney disease) stage 5, GFR less than 15 ml/min (HCC) 09/12/2018  . Microalbuminuria due to type 2 diabetes mellitus ([place]) 04/21/2018  . Obesity (BMI 30.0-34.9) 02/05/2016  . Osteoporosis with pathological fracture of ankle and foot 05/30/2015  . Encounter for therapeutic drug monitoring 11/22/2014  . Atrial flutter ([place]) 05/08/2014  . Complete heart block ([place]) 07/29/2013  . Gout 08/20/2012  . 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 Other screening mammogram 02/04/2012  . Anemia, chronic disease 12/04/2011  . Hyperlipidemia with target LDL less than 100 12/04/2011  . 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 Long term (current) use of anticoagulants 07/18/2011  . Atrial fibrillation ([place]) 07/18/2011  . Routine general medical examination at a health care facility 05/15/2011  . Hypertrophic obstructive cardiomyopathy ([place]) 05/22/2010  . Hypersomnia 08/16/2009  . Depression, major, in partial remission ([place]) 11/28/2008  . Chronic diastolic heart failure ([place]) 08/25/2008  . COPD (chronic obstructive pulmonary disease) with acute bronchitis ([place]) 08/10/2008  . GERD 08/10/2008  . Obstructive sleep apnea 08/10/2007  . Allergic rhinitis 08/09/2007    Current Outpatient Medications on File  Prior to Visit  Medication Sig Dispense Refill  . allopurinol (ZYLOPRIM) 100 MG tablet TAKE 1 TABLET BY MOUTH DAILY. 90 tablet 0  . amoxicillin (AMOXIL) 500 MG capsule TAKE 4 CAPSULES 1 HOUR PRIOR TO PROCEDURE 8 capsule 3  . Betamethasone Valerate 0.12 % foam APPLY TO SCALP QD 100 g 2  . buPROPion (WELLBUTRIN XL) 300 MG 24 hr tablet TAKE 1 TABLET ONCE DAILY. 90 tablet 0  . ciclopirox (LOPROX) 0.77 % SUSP APPLY 1 PUMP TWICE A DAY. 60 mL 0  . dronabinol (MARINOL) 5 MG capsule TAKE 1 CAPSULE TWICE DAILY BEFORE LUNCH AND SUPPER. 180 capsule 0  . famotidine (PEPCID) 40 MG tablet TAKE 1 TABLET ONCE DAILY. 90 tablet 0  . ferrous sulfate 325 (65 FE) MG tablet Take 1 tablet (325 mg total) by mouth 2 (two) times daily with a meal. (Patient taking differently: Take 325 mg by mouth 3 (three) times daily with meals.) 180 tablet 1  . fexofenadine (ALLEGRA) 180 MG tablet Take 180 mg by mouth daily as needed for allergies.    Marland Kitchen ipratropium (ATROVENT) 0.03 % nasal spray USE 2 SPRAYS IN EACH NOSTRIL 3 TIMES A DAY AS NEEDED. 30 mL 0  . ketoconazole (NIZORAL) 2 % cream APPLY TO AFFECTED AREA TWICE A DAY. 60 g 0  . lamoTRIgine (LAMICTAL) 25 MG tablet TAKE 1 TABLET ONCE DAILY. 90 tablet 0  . metoprolol tartrate (LOPRESSOR) 25 MG tablet TAKE 1 TABLET BY MOUTH TWICE DAILY. 180 tablet 5  . modafinil (PROVIGIL) 100 MG tablet Take 1 tablet (100 mg total) by mouth daily. 30 tablet 5  . PRESCRIPTION MEDICATION CPAP    .  SYMBICORT 80-4.5 MCG/ACT inhaler USE 2 PUFFS TWICE A DAY. 30.6 g 5  . torsemide (DEMADEX) 20 MG tablet TAKE 1 TABLET ONCE DAILY. 90 tablet 5  . VIIBRYD 40 MG TABS TAKE 1 TABLET ONCE DAILY. 90 tablet 0  . warfarin (COUMADIN) 2.5 MG tablet TAKE 1 TABLET DAILY EXCEPT 2 TABLETS ON MONDAY, WEDNESDAY, AND FRIDAYS 40 tablet 5  . [DISCONTINUED] Alum & Mag Hydroxide-Simeth (MAGIC MOUTHWASH) SOLN Take 5 mLs by mouth 3 (three) times daily. 14 days then stop      No current facility-administered medications on file  prior to visit.    Past Medical History:  Diagnosis Date  . Allergic rhinitis   . Asthma    NOS w/ acute exacerbation  . Blood transfusion without reported diagnosis   . CKD (chronic kidney disease) stage 3, GFR 30-59 ml/min ([place]) 09/12/2018  . Colon polyps    TUBULAR ADENOMAS AND HYPERPLASTIC  . Complete heart block (HCC)    requiring PPM (MDT) post surgical myomectomy at [facility],  leads are epicardial with abdominal implant, high ventricular threshold at implant  . COPD (chronic obstructive pulmonary disease) ([place])   . Depressive disorder   . Diastolic dysfunction   . DM (diabetes mellitus) ([place])   . Gallstones   . GERD (gastroesophageal reflux disease)   . Gout 08/20/2012  . Heart murmur   . Hyperpotassemia   . Hypersomnia   . Hypertension   . Hypertrophic cardiomyopathy ([place])    s/p surgical myomectomy at [facility] 00/93 complicated by septal VSD post procedure requiring reoperation with patch closure and tricuspid valve replacement  . Kidney stones   . Myocardial infarction ([place]) 2011  . Obstructive sleep apnea    persistent daytime sleepiness despite cpap  . 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 Psoriasis   . Pyuria   . Renal insufficiency   . Tricuspid valve replaced    MDT 41mm Mosaic Valve  . Typical atrial flutter ([place]) 9/15    Past Surgical History:  Procedure Laterality Date  . ABDOMINAL HYSTERECTOMY    . APPENDECTOMY    . BREAST CYST EXCISION    . CARDIOVERSION N/A 08/01/2014   Procedure: CARDIOVERSION;  Surgeon: Thayer Headings, MD;  Location: [facility];  Service: Cardiovascular;  Laterality: N/A;  . CESAREAN SECTION    . CHOLECYSTECTOMY    . COLONOSCOPY    . EYE SURGERY  [id number]   left eye lens implant  . EYE SURGERY  [id number]   right eye lens implant  . INSERT / REPLACE / REMOVE PACEMAKER  07/13/2012  . PACEMAKER INSERTION  07/02/11   epicardial wires with abdominal implant at [facility] 12/12,  high ventricular lead threshold at implant per Dr Westley Gambles  . PERMANENT  PACEMAKER INSERTION N/A 07/13/2012   Procedure: PERMANENT PACEMAKER INSERTION;  Surgeon: Thompson Grayer, MD;  Location: [facility] CATH LAB;  Service: Cardiovascular;  Laterality: N/A;  . POLYPECTOMY    . septal myomectomy for hypertrophic CM  06/23/11   by Dr Evelina Dun at [facility], complicated by septal VSD requiring patch repair and tricuspid valve replacement  . TONSILLECTOMY    . TRICUSPID VALVE REPLACEMENT  11/12   Medtronic 49mm Mosaic tissue valve  . VSD REPAIR  06/2011    Social History   Socioeconomic History  . Marital status: Widowed    Spouse name: [name]  . Number of children: 3  . Years of education: Not on file  . Highest education level: Not on file  Occupational History  . Occupation: retired  Employer: RETIRED  Tobacco Use  . Smoking status: Never Smoker  . Smokeless tobacco: Never Used  Vaping Use  . Vaping Use: Never used  Substance and Sexual Activity  . Alcohol use: No    Alcohol/week: 0.0 standard drinks  . Drug use: No  . Sexual activity: Not on file  Other Topics Concern  . Not on file  Social History Narrative   ** Merged History Encounter **       Social Determinants of Health   Financial Resource Strain: Low Risk   . Difficulty of Paying Living Expenses: Not hard at all  Food Insecurity: No Food Insecurity  . Worried About Charity fundraiser in the Last Year: Never true  . Ran Out of Food in the Last Year: Never true  Transportation Needs: No Transportation Needs  . Lack of Transportation (Medical): No  . Lack of Transportation (Non-Medical): No  Physical Activity: Inactive  . Days of Exercise per Week: 0 days  . Minutes of Exercise per Session: 0 min  Stress: No Stress Concern Present  . Feeling of Stress : Not at all  Social Connections: Not on file    Family History  Problem Relation Age of Onset  . Hypertension Daughter   . Heart disease Maternal Grandfather   . Heart disease Paternal Grandfather   . Bladder Cancer Father   . Prostate  cancer Father   . Cancer Father   . Varicose Veins Father   . Heart attack Father   . Breast cancer Mother   . Dementia Mother   . Hypertension Mother   . Cancer Mother   . Ovarian cancer Maternal Aunt   . Pancreatic cancer Cousin   . Breast cancer Paternal Aunt   . Dementia Maternal Aunt        x 2  . Dementia Maternal Uncle        x 2  . Colon cancer Neg Hx   . Rectal cancer Neg Hx   . Stomach cancer Neg Hx   . Esophageal cancer Neg Hx     Review of Systems     Objective:  There were no vitals filed for this visit. BP Readings from Last 3 Encounters:  11/22/20 120/66  11/15/20 130/70  11/15/20 130/70   Wt Readings from Last 3 Encounters:  11/22/20 136 lb (61.7 kg)  11/15/20 130 lb (59 kg)  11/08/20 132 lb (59.9 kg)   There is no height or weight on file to calculate BMI.   Physical Exam         Assessment & Plan:    See Problem List for Assessment and Plan of chronic medical problems.    This visit occurred during the SARS-CoV-2 public health emergency.  Safety protocols were in place, including screening questions prior to the visit, additional usage of staff PPE, and extensive cleaning of exam room while observing appropriate contact time as indicated for disinfecting solutions.   This encounter was created in error - please disregard.

## 2020-12-04 ENCOUNTER — Encounter: Payer: Self-pay | Admitting: Internal Medicine

## 2020-12-04 ENCOUNTER — Encounter: Payer: Medicare PPO | Admitting: Internal Medicine

## 2020-12-04 ENCOUNTER — Ambulatory Visit: Payer: Medicare PPO | Admitting: Internal Medicine

## 2020-12-04 ENCOUNTER — Other Ambulatory Visit: Payer: Self-pay

## 2020-12-04 VITALS — BP 126/70 | HR 76 | Temp 98.1°F

## 2020-12-04 DIAGNOSIS — Z7901 Long term (current) use of anticoagulants: Secondary | ICD-10-CM

## 2020-12-04 DIAGNOSIS — S0003XA Contusion of scalp, initial encounter: Secondary | ICD-10-CM | POA: Diagnosis not present

## 2020-12-04 DIAGNOSIS — D638 Anemia in other chronic diseases classified elsewhere: Secondary | ICD-10-CM

## 2020-12-04 DIAGNOSIS — I480 Paroxysmal atrial fibrillation: Secondary | ICD-10-CM

## 2020-12-04 DIAGNOSIS — D696 Thrombocytopenia, unspecified: Secondary | ICD-10-CM

## 2020-12-04 LAB — CBC WITH DIFFERENTIAL/PLATELET
Basophils Absolute: 0 10*3/uL (ref 0.0–0.1)
Basophils Relative: 0.7 % (ref 0.0–3.0)
Eosinophils Absolute: 0.3 10*3/uL (ref 0.0–0.7)
Eosinophils Relative: 5.6 % — ABNORMAL HIGH (ref 0.0–5.0)
HCT: 36.4 % (ref 36.0–46.0)
Hemoglobin: 12 g/dL (ref 12.0–15.0)
Lymphocytes Relative: 13 % (ref 12.0–46.0)
Lymphs Abs: 0.6 10*3/uL — ABNORMAL LOW (ref 0.7–4.0)
MCHC: 33 g/dL (ref 30.0–36.0)
MCV: 94.5 fl (ref 78.0–100.0)
Monocytes Absolute: 0.4 10*3/uL (ref 0.1–1.0)
Monocytes Relative: 8.9 % (ref 3.0–12.0)
Neutro Abs: 3.5 10*3/uL (ref 1.4–7.7)
Neutrophils Relative %: 71.8 % (ref 43.0–77.0)
Platelets: 131 10*3/uL — ABNORMAL LOW (ref 150.0–400.0)
RBC: 3.85 Mil/uL — ABNORMAL LOW (ref 3.87–5.11)
RDW: 13.9 % (ref 11.5–15.5)
WBC: 4.8 10*3/uL (ref 4.0–10.5)

## 2020-12-04 LAB — PROTIME-INR
INR: 2.3 ratio — ABNORMAL HIGH (ref 0.8–1.0)
Prothrombin Time: 26 s — ABNORMAL HIGH (ref 9.6–13.1)

## 2020-12-04 NOTE — Patient Instructions (Signed)
Wound Care, Adult Taking care of your wound properly can help to prevent pain, infection, and scarring. It can also help your wound heal more quickly. Follow instructions from your health care provider about how to care for your wound. Supplies needed:  Soap and water.  Wound cleanser.  Gauze.  If needed, a clean bandage (dressing) or other type of wound dressing material to cover or place in the wound. Follow your health care provider's instructions about what dressing supplies to use.  Cream or ointment to apply to the wound, if told by your health care provider. How to care for your wound Cleaning the wound Ask your health care provider how to clean the wound. This may include:  Using mild soap and water or a wound cleanser.  Using a clean gauze to pat the wound dry after cleaning it. Do not rub or scrub the wound. Dressing care  Wash your hands with soap and water for at least 20 seconds before and after you change the dressing. If soap and water are not available, use hand sanitizer.  Change your dressing as told by your health care provider. This may include: ? Cleaning or rinsing out (irrigating) the wound. ? Placing a dressing over the wound or in the wound (packing). ? Covering the wound with an outer dressing.  Leave any stitches (sutures), skin glue, or adhesive strips in place. These skin closures may need to stay in place for 2 weeks or longer. If adhesive strip edges start to loosen and curl up, you may trim the loose edges. Do not remove adhesive strips completely unless your health care provider tells you to do that.  Ask your health care provider when you can leave the wound uncovered. Checking for infection Check your wound area every day for signs of infection. Check for:  More redness, swelling, or pain.  Fluid or blood.  Warmth.  Pus or a bad smell.   Follow these instructions at home Medicines  If you were prescribed an antibiotic medicine, cream, or  ointment, take or apply it as told by your health care provider. Do not stop using the antibiotic even if your condition improves.  If you were prescribed pain medicine, take it 30 minutes before you do any wound care or as told by your health care provider.  Take over-the-counter and prescription medicines only as told by your health care provider. Eating and drinking  Eat a diet that includes protein, vitamin A, vitamin C, and other nutrient-rich foods to help the wound heal. ? Foods rich in protein include meat, fish, eggs, dairy, beans, and nuts. ? Foods rich in vitamin A include carrots and dark green, leafy vegetables. ? Foods rich in vitamin C include citrus fruits, tomatoes, broccoli, and peppers.  Drink enough fluid to keep your urine pale yellow. General instructions  Do not take baths, swim, use a hot tub, or do anything that would put the wound underwater until your health care provider approves. Ask your health care provider if you may take showers. You may only be allowed to take sponge baths.  Do not scratch or pick at the wound. Keep it covered as told by your health care provider.  Return to your normal activities as told by your health care provider. Ask your health care provider what activities are safe for you.  Protect your wound from the sun when you are outside for the first 6 months, or for as long as told by your health care provider. Cover   up the scar area or apply sunscreen that has an SPF of at least 30.  Do not use any products that contain nicotine or tobacco, such as cigarettes, e-cigarettes, and chewing tobacco. These may delay wound healing. If you need help quitting, ask your health care provider.  Keep all follow-up visits as told by your health care provider. This is important. Contact a health care provider if:  You received a tetanus shot and you have swelling, severe pain, redness, or bleeding at the injection site.  Your pain is not controlled  with medicine.  You have any of these signs of infection: ? More redness, swelling, or pain around the wound. ? Fluid or blood coming from the wound. ? Warmth coming from the wound. ? Pus or a bad smell coming from the wound. ? A fever or chills.  You are nauseous or you vomit.  You are dizzy. Get help right away if:  You have a red streak of skin near the area around your wound.  Your wound has been closed with staples, sutures, skin glue, or adhesive strips and it begins to open up and separate.  Your wound is bleeding, and the bleeding does not stop with gentle pressure.  You have a rash.  You faint.  You have trouble breathing. These symptoms may represent a serious problem that is an emergency. Do not wait to see if the symptoms will go away. Get medical help right away. Call your local emergency services (911 in the [place] Do not drive yourself to the hospital. Summary  Always wash your hands with soap and water for at least 20 seconds before and after changing your dressing.  Change your dressing as told by your health care provider.  To help with healing, eat foods that are rich in protein, vitamin A, vitamin C, and other nutrients.  Check your wound every day for signs of infection. Contact your health care provider if you suspect that your wound is infected. This information is not intended to replace advice given to you by your health care provider. Make sure you discuss any questions you have with your health care provider. Document Revised: 05/06/2019 Document Reviewed: 05/06/2019 Elsevier Patient Education  2021 Elsevier Inc.  

## 2020-12-04 NOTE — Progress Notes (Signed)
Subjective:  Patient ID: Holly Simmons, female    DOB: September 09, 1940  Age: 80 y.o. MRN: [id number]  CC: Wound Check  This visit occurred during the SARS-CoV-2 public health emergency.  Safety protocols were in place, including screening questions prior to the visit, additional usage of staff PPE, and extensive cleaning of exam room while observing appropriate contact time as indicated for disinfecting solutions.    HPI Holly Simmons presents for f/up -   She underwent removal of a precancerous lesion on her left forehead about 2 weeks ago.  She had some postprocedural ecchymosis and now complains that the area is painful and swollen.  Outpatient Medications Prior to Visit  Medication Sig Dispense Refill  . allopurinol (ZYLOPRIM) 100 MG tablet TAKE 1 TABLET BY MOUTH DAILY. 90 tablet 0  . amoxicillin (AMOXIL) 500 MG capsule TAKE 4 CAPSULES 1 HOUR PRIOR TO PROCEDURE 8 capsule 3  . Betamethasone Valerate 0.12 % foam APPLY TO SCALP QD 100 g 2  . buPROPion (WELLBUTRIN XL) 300 MG 24 hr tablet TAKE 1 TABLET ONCE DAILY. 90 tablet 0  . ciclopirox (LOPROX) 0.77 % SUSP APPLY 1 PUMP TWICE A DAY. 60 mL 0  . dronabinol (MARINOL) 5 MG capsule TAKE 1 CAPSULE TWICE DAILY BEFORE LUNCH AND SUPPER. 180 capsule 0  . famotidine (PEPCID) 40 MG tablet TAKE 1 TABLET ONCE DAILY. 90 tablet 0  . ferrous sulfate 325 (65 FE) MG tablet Take 1 tablet (325 mg total) by mouth 2 (two) times daily with a meal. (Patient taking differently: Take 325 mg by mouth 3 (three) times daily with meals.) 180 tablet 1  . fexofenadine (ALLEGRA) 180 MG tablet Take 180 mg by mouth daily as needed for allergies.    Marland Kitchen ipratropium (ATROVENT) 0.03 % nasal spray USE 2 SPRAYS IN EACH NOSTRIL 3 TIMES A DAY AS NEEDED. 30 mL 0  . ketoconazole (NIZORAL) 2 % cream APPLY TO AFFECTED AREA TWICE A DAY. 60 g 0  . lamoTRIgine (LAMICTAL) 25 MG tablet TAKE 1 TABLET ONCE DAILY. 90 tablet 0  . metoprolol tartrate (LOPRESSOR) 25 MG tablet TAKE  1 TABLET BY MOUTH TWICE DAILY. 180 tablet 5  . modafinil (PROVIGIL) 100 MG tablet Take 1 tablet (100 mg total) by mouth daily. 30 tablet 5  . PRESCRIPTION MEDICATION CPAP    . SYMBICORT 80-4.5 MCG/ACT inhaler USE 2 PUFFS TWICE A DAY. 30.6 g 5  . torsemide (DEMADEX) 20 MG tablet TAKE 1 TABLET ONCE DAILY. 90 tablet 5  . VIIBRYD 40 MG TABS TAKE 1 TABLET ONCE DAILY. 90 tablet 0  . warfarin (COUMADIN) 2.5 MG tablet TAKE 1 TABLET DAILY EXCEPT 2 TABLETS ON MONDAY, WEDNESDAY, AND FRIDAYS 40 tablet 5   No facility-administered medications prior to visit.    ROS Review of Systems  Constitutional: Negative.  Negative for fatigue.  HENT: Negative.   Eyes: Negative.   Respiratory: Negative for cough, chest tightness and wheezing.   Cardiovascular: Negative for chest pain, palpitations and leg swelling.  Gastrointestinal: Negative for abdominal pain.  Endocrine: Negative.   Genitourinary: Negative.   Hematological: Negative for adenopathy. Bruises/bleeds easily.  Psychiatric/Behavioral: Negative.     Objective:  BP 126/70 (BP Location: Left Arm, Patient Position: Sitting, Cuff Size: Large)   Pulse 76   Temp 98.1 F (36.7 C) (Oral)   BP Readings from Last 3 Encounters:  12/04/20 126/70  12/04/20 126/70  11/22/20 120/66    Wt Readings from Last 3 Encounters:  12/04/20 136  lb 3.2 oz (61.8 kg)  11/22/20 136 lb (61.7 kg)  11/15/20 130 lb (59 kg)    Physical Exam Vitals reviewed.  HENT:     Head:      Lab Results  Component Value Date   WBC 4.8 12/04/2020   HGB 12.0 12/04/2020   HCT 36.4 12/04/2020   PLT 131.0 (L) 12/04/2020   GLUCOSE 79 11/09/2020   CHOL 128 07/12/2020   TRIG 53.0 07/12/2020   HDL 56.30 07/12/2020   LDLCALC 61 07/12/2020   ALT 8 08/23/2020   AST 14 08/23/2020   NA 139 11/09/2020   K 3.8 11/09/2020   CL 99 11/09/2020   CREATININE 1.90 (H) 11/09/2020   BUN 36 (H) 11/09/2020   CO2 35 (H) 11/09/2020   TSH 2.53 07/12/2020   INR 2.3 (H) 12/04/2020    HGBA1C 5.3 03/07/2020   MICROALBUR 63.9 (H) 04/20/2018    DG ESOPHAGUS W SINGLE CM (SOL OR THIN BA)  Result Date: 10/11/2020 CLINICAL DATA:  80 year old female with history of dysphagia intermittently for the past 8 years. EXAM: ESOPHOGRAM/BARIUM SWALLOW TECHNIQUE: Single contrast examination was performed using  thin barium. FLUOROSCOPY TIME:  Fluoroscopy Time:  2.4 minutes Radiation Exposure Index (if provided by the fluoroscopic device): 13.1 mGy Number of Acquired Spot Images: 0 COMPARISON:  None. FINDINGS: Comment: Today's study is limited by patient mobility and inability to follow all commands during the examination. With the above limitations in mind, single contrast images of the esophagus demonstrated a normal esophageal anatomy, without evidence of esophageal mass, stricture or esophageal ring. Multiple single swallow attempts demonstrated normal primary peristaltic waves. No tertiary contractions were observed. A barium tablet was administered, which passed readily into the stomach. IMPRESSION: 1. Within the limitations of today's examination, the examination was normal without explanation for patient's history of dysphagia. Electronically Signed   By: Vinnie Langton M.D.   On: 10/11/2020 11:44   After informed verbal consent was obtained. Using Betadine for cleansing and 2% Lidocaine with epinephrine for anesthetic (2 cc's used), with sterile technique a 5 mm punch incision was made and a small hematoma was evacuated from the medial aspect. There was no exudate. No deep tracking or loculations were found. The cavity was packed with iodoform. Hemostasis was obtained by pressure. The procedure was well tolerated without complications.  Assessment & Plan:   [name] was seen today for wound check.  Diagnoses and all orders for this visit:  Long term (current) use of anticoagulants- Her INR is therapeutic at 2.3. -     Protime-INR; Future -     Protime-INR  Thrombocytopenia ([place])- Her  PLT count is stable. -     CBC with Differential/Platelet; Future -     CBC with Differential/Platelet  Anemia, chronic disease- Her H/H are normal now. -     CBC with Differential/Platelet; Future -     CBC with Differential/Platelet  Paroxysmal atrial fibrillation ([place]) -     Protime-INR; Future -     Protime-INR  Hematoma of scalp, initial encounter- s/p successful removal. She will remove the packing in 2 days.   I am having Holly Simmons maintain her fexofenadine, PRESCRIPTION MEDICATION, Betamethasone Valerate, modafinil, ipratropium, ketoconazole, ciclopirox, amoxicillin, dronabinol, allopurinol, Viibryd, famotidine, Symbicort, torsemide, metoprolol tartrate, warfarin, ferrous sulfate, lamoTRIgine, and buPROPion.  No orders of the defined types were placed in this encounter.    Follow-up: Return if symptoms worsen or fail to improve.  Scarlette Calico, MD

## 2020-12-07 ENCOUNTER — Other Ambulatory Visit: Payer: Self-pay

## 2020-12-07 ENCOUNTER — Telehealth: Payer: Medicare PPO | Admitting: Cardiovascular Disease

## 2020-12-07 VITALS — Ht 63.0 in | Wt 136.0 lb

## 2020-12-07 DIAGNOSIS — Q210 Ventricular septal defect: Secondary | ICD-10-CM

## 2020-12-07 DIAGNOSIS — Z952 Presence of prosthetic heart valve: Secondary | ICD-10-CM

## 2020-12-07 DIAGNOSIS — Z5181 Encounter for therapeutic drug level monitoring: Secondary | ICD-10-CM | POA: Diagnosis not present

## 2020-12-07 DIAGNOSIS — Z950 Presence of cardiac pacemaker: Secondary | ICD-10-CM | POA: Diagnosis not present

## 2020-12-07 DIAGNOSIS — Z7901 Long term (current) use of anticoagulants: Secondary | ICD-10-CM

## 2020-12-07 DIAGNOSIS — Q2542 Hypoplasia of aorta: Secondary | ICD-10-CM

## 2020-12-07 DIAGNOSIS — I482 Chronic atrial fibrillation, unspecified: Secondary | ICD-10-CM

## 2020-12-07 DIAGNOSIS — I421 Obstructive hypertrophic cardiomyopathy: Secondary | ICD-10-CM | POA: Diagnosis not present

## 2020-12-07 NOTE — Patient Instructions (Signed)

## 2020-12-11 ENCOUNTER — Other Ambulatory Visit: Payer: Self-pay | Admitting: Internal Medicine

## 2020-12-11 DIAGNOSIS — L409 Psoriasis, unspecified: Secondary | ICD-10-CM

## 2020-12-11 DIAGNOSIS — K219 Gastro-esophageal reflux disease without esophagitis: Secondary | ICD-10-CM

## 2020-12-11 DIAGNOSIS — R64 Cachexia: Secondary | ICD-10-CM

## 2020-12-11 IMAGING — MG MM DIGITAL SCREENING BILAT W/ TOMO W/ CAD
6 of 10 series · 6 of 30 positions shown · non-contrast
Comparison: Previous exam(s).

CLINICAL DATA: Screening.

EXAM:
DIGITAL SCREENING BILATERAL MAMMOGRAM WITH TOMO AND CAD

[R MLO synth-2D]
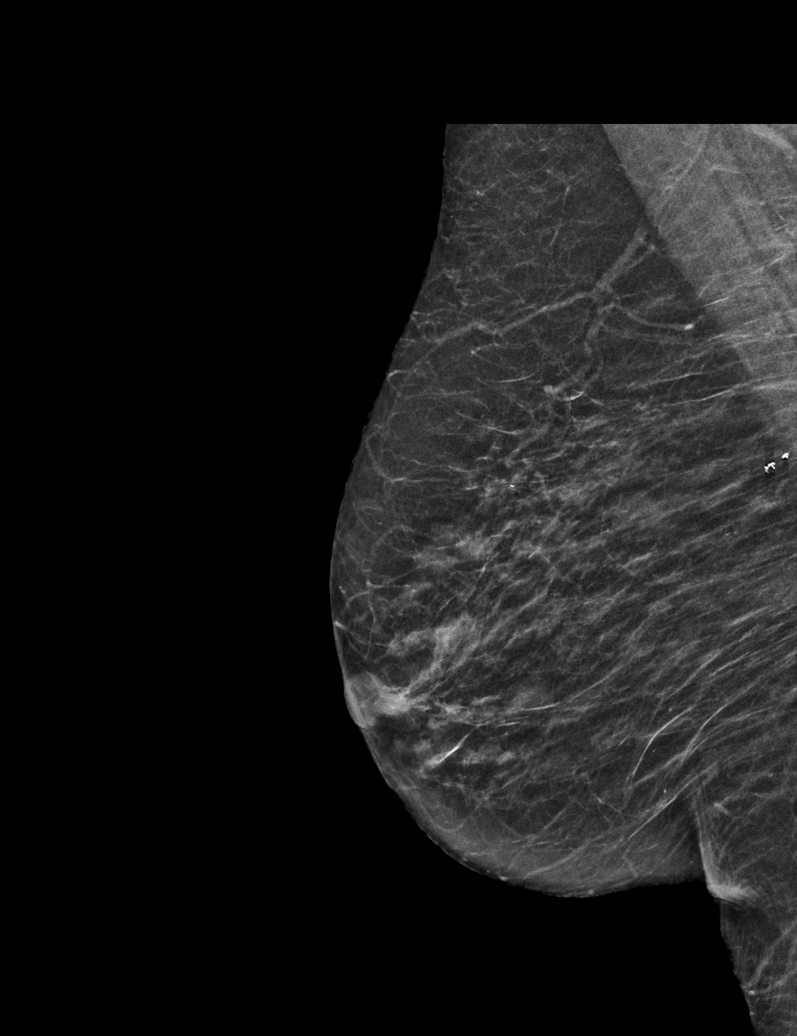

[L MLO synth-2D (1 of 2)]
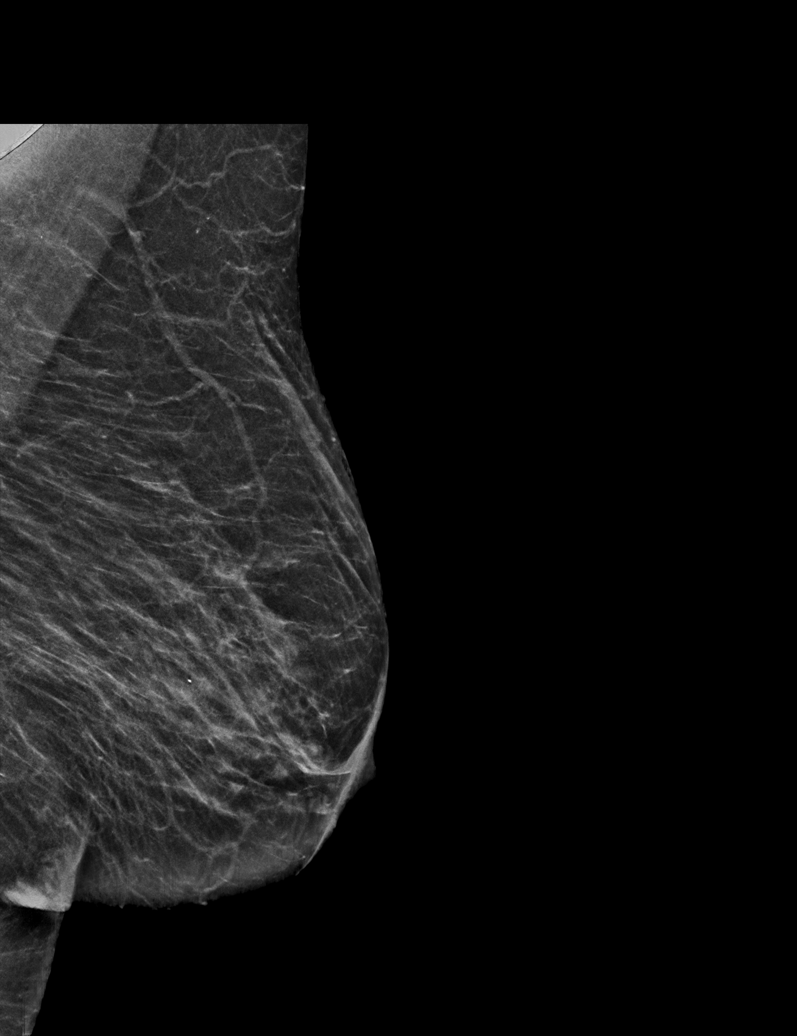

[L MLO synth-2D (2 of 2)]
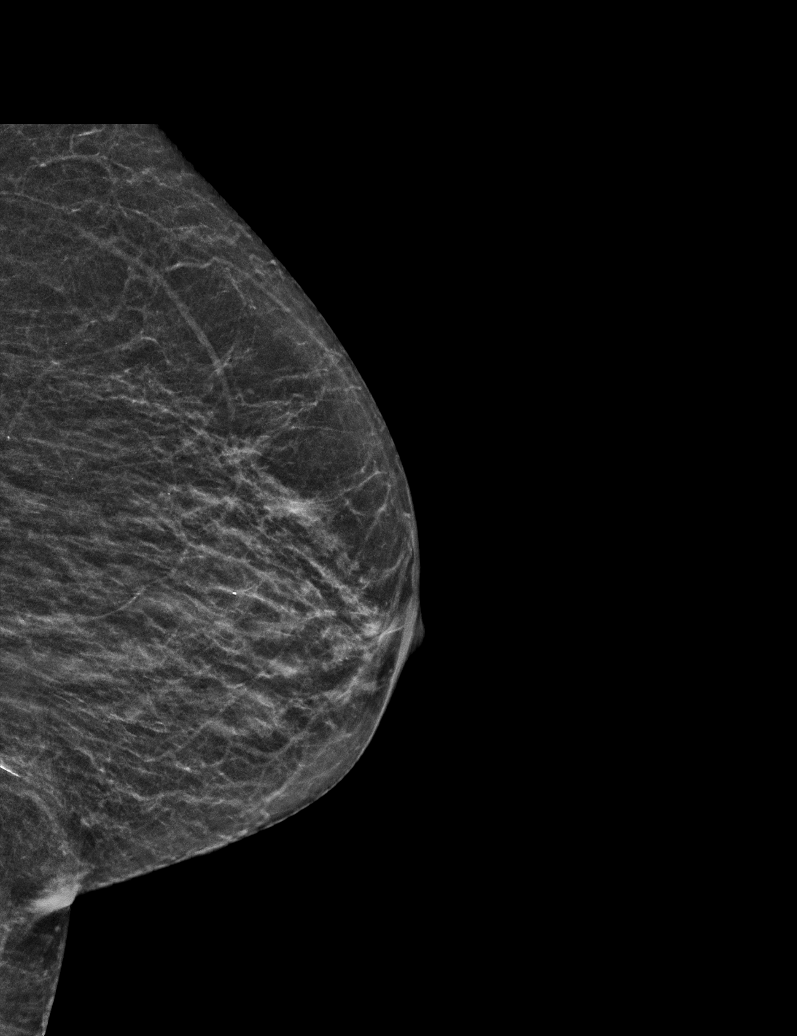

[R CC synth-2D]
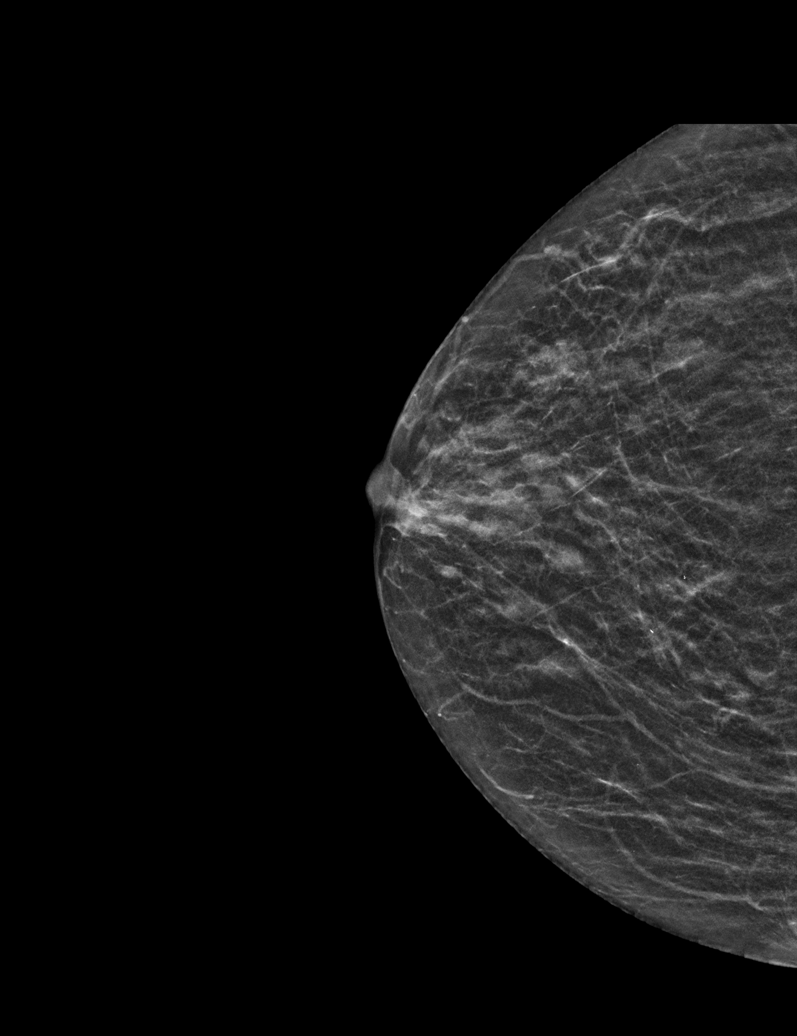

[L CC synth-2D]
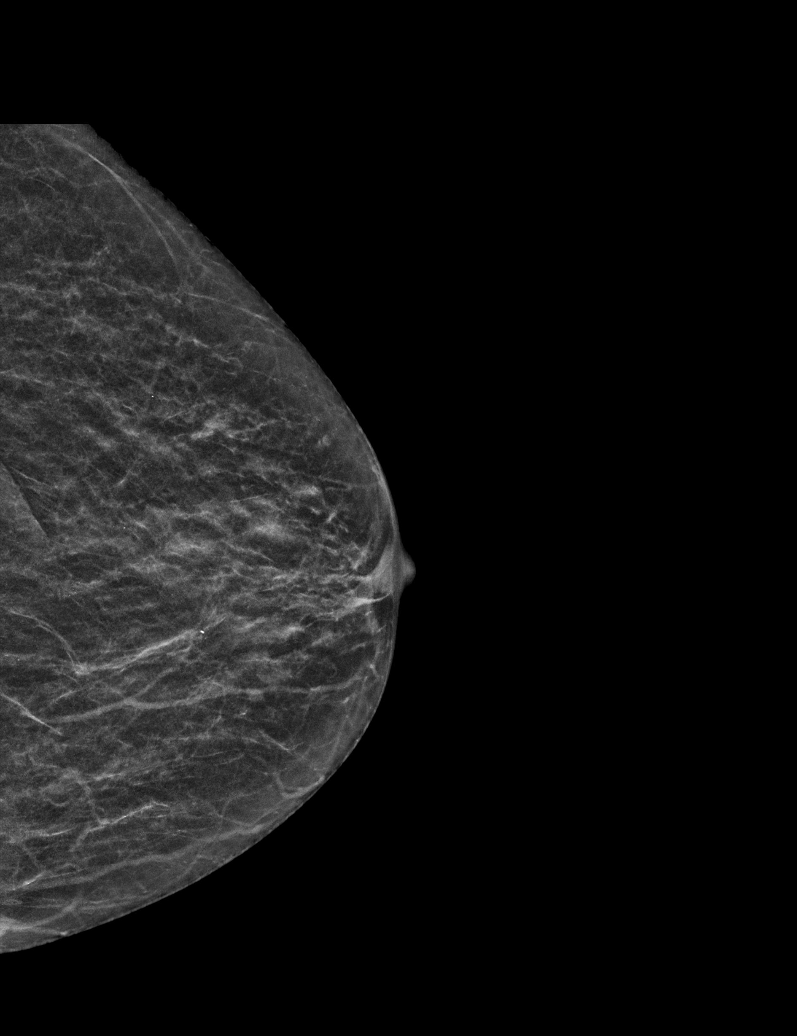

[L MLO tomo · tomo slice 23/46.0]
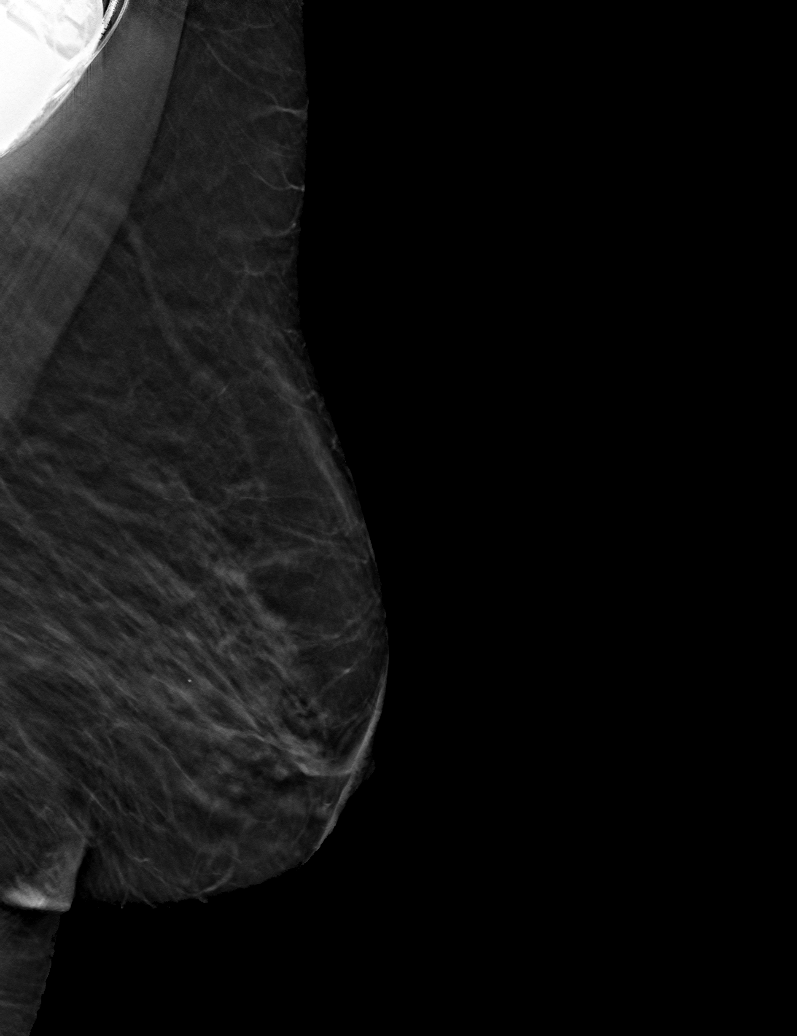

[6 of 30 positions shown; findings below may reference images not displayed]

ACR Breast Density Category b: There are scattered areas of
fibroglandular density.
FINDINGS: There are no findings suspicious for malignancy. Images were
processed with CAD.
IMPRESSION: No mammographic evidence of malignancy. A result letter of this
screening mammogram will be mailed directly to the patient.

RECOMMENDATION:
Screening mammogram in one year. (Code:[id number])

BI-RADS CATEGORY  1: Negative.

## 2020-12-13 ENCOUNTER — Ambulatory Visit: Payer: Medicare PPO | Admitting: General Practice

## 2020-12-13 ENCOUNTER — Other Ambulatory Visit: Payer: Self-pay

## 2020-12-13 ENCOUNTER — Other Ambulatory Visit: Payer: Self-pay | Admitting: General Practice

## 2020-12-13 DIAGNOSIS — M80071S Age-related osteoporosis with current pathological fracture, right ankle and foot, sequela: Secondary | ICD-10-CM

## 2020-12-13 DIAGNOSIS — Z7901 Long term (current) use of anticoagulants: Secondary | ICD-10-CM | POA: Diagnosis not present

## 2020-12-13 DIAGNOSIS — M810 Age-related osteoporosis without current pathological fracture: Secondary | ICD-10-CM | POA: Diagnosis not present

## 2020-12-13 DIAGNOSIS — I4892 Unspecified atrial flutter: Secondary | ICD-10-CM

## 2020-12-13 LAB — POCT INR: INR: 2.8 (ref 2.0–3.0)

## 2020-12-13 IMAGING — US US RENAL
1 series · 14 of 25 positions shown · non-contrast
Comparison: CT, 03/30/2014.

CLINICAL DATA: Worsening renal function.  Stage 3 kidney disease.

EXAM:
RENAL / URINARY TRACT ULTRASOUND COMPLETE

[Series 1: us renal · 0.23mm/px · 14 of 41 slices shown]
[im 1/41]
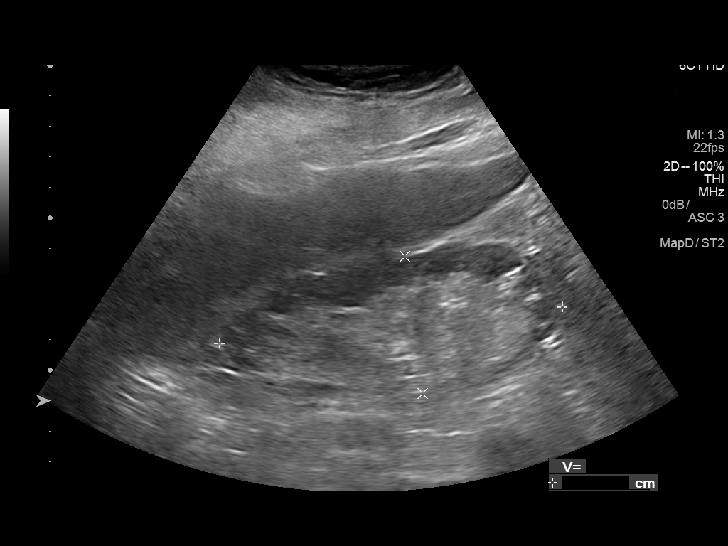
[im 4/41]
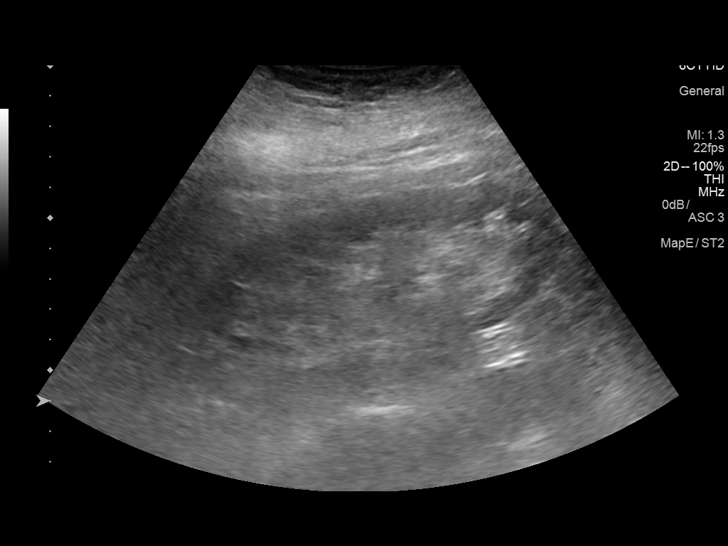
[im 7/41]
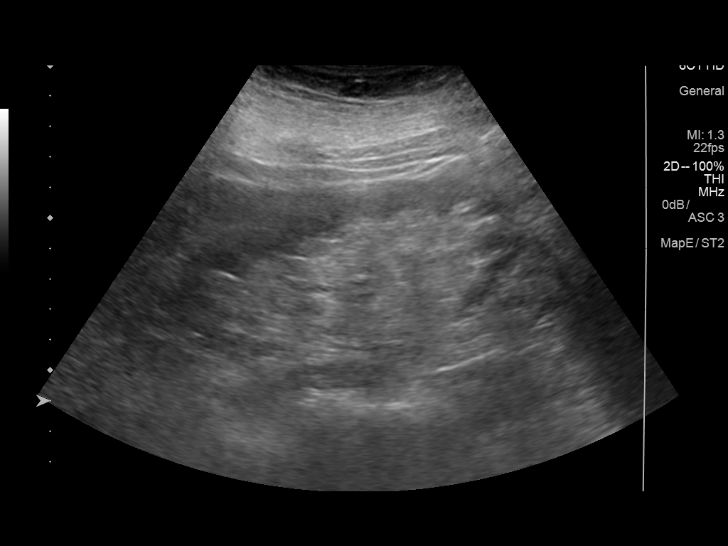
[im 11/41]
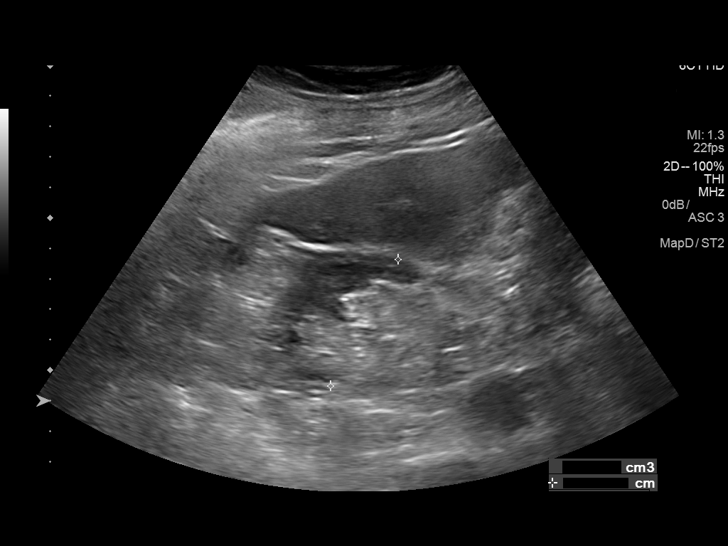
[im 14/41]
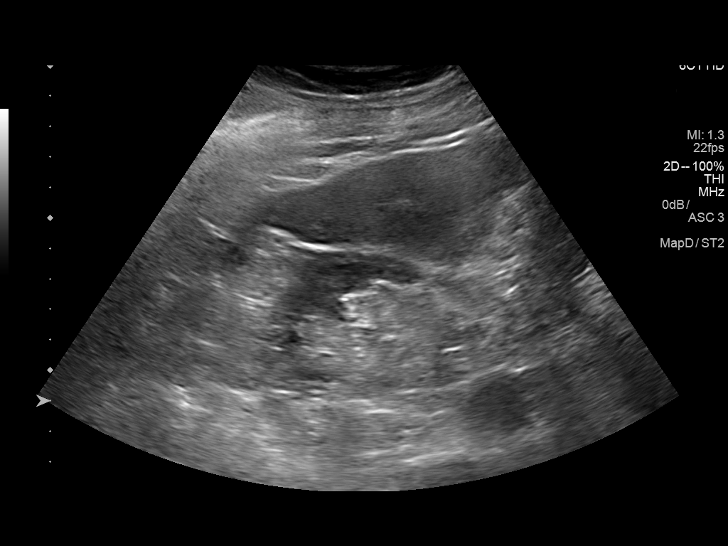
[im 16/41]
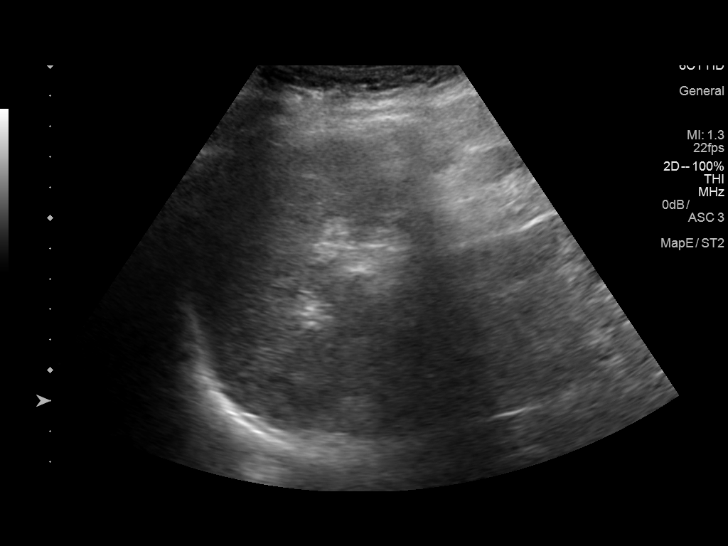
[im 19/41]
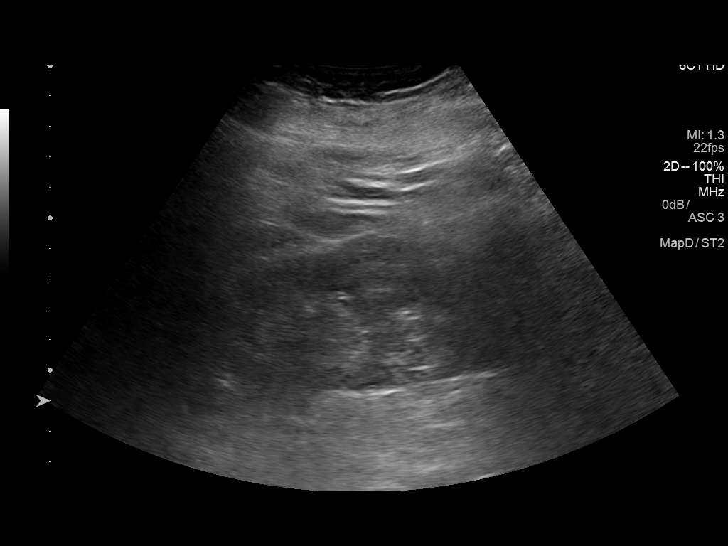
[im 22/41]
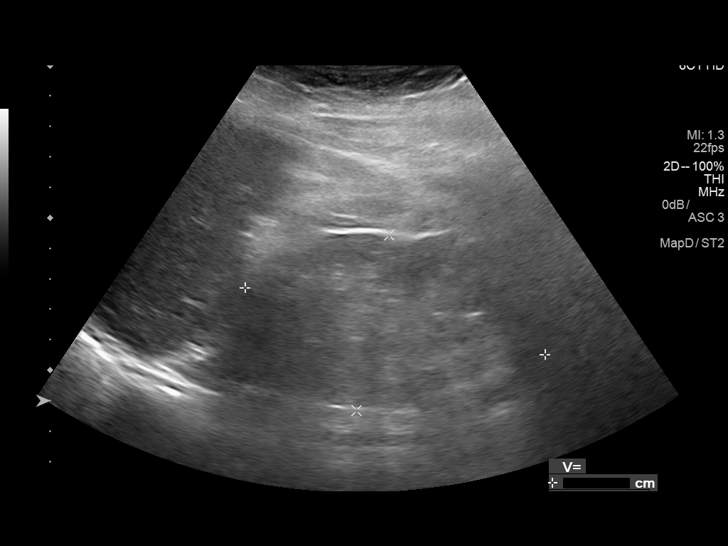
[im 26/41]
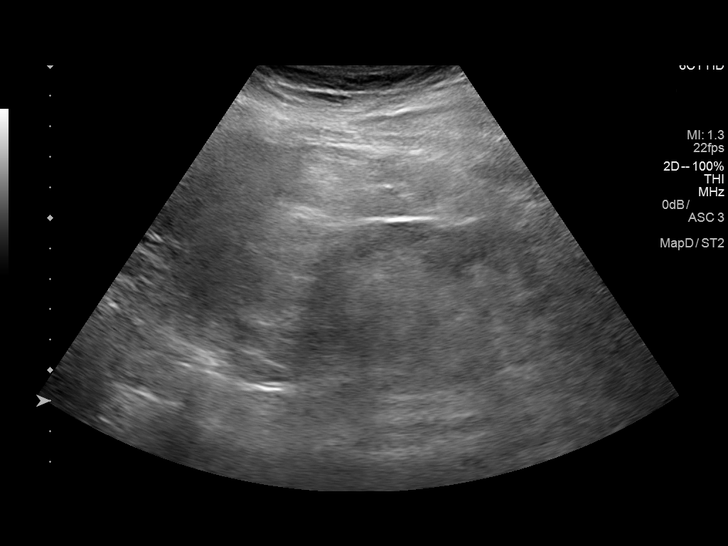
[im 27/41]
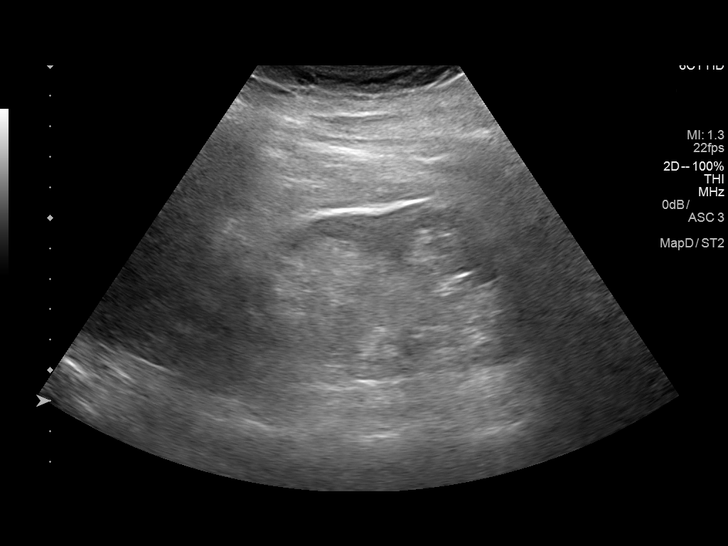
[im 31/41]
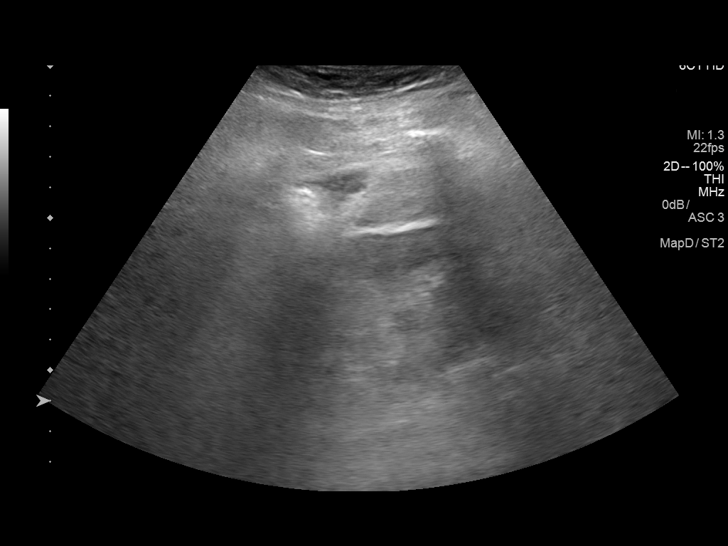
[im 34/41]
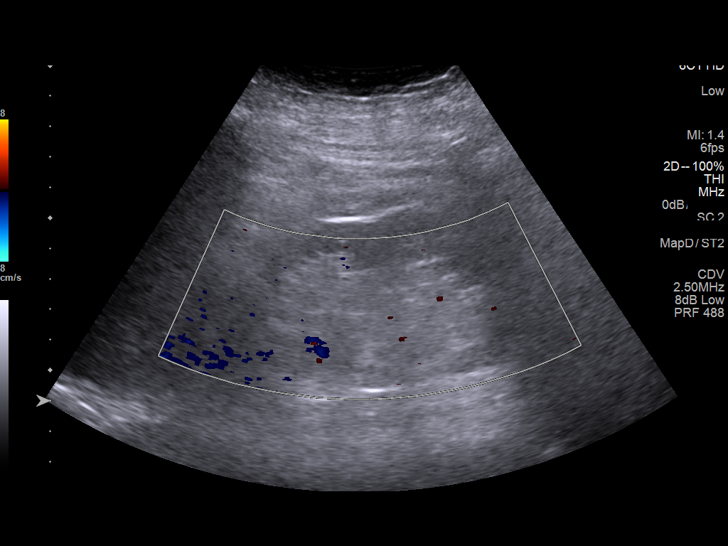
[im 37/41]
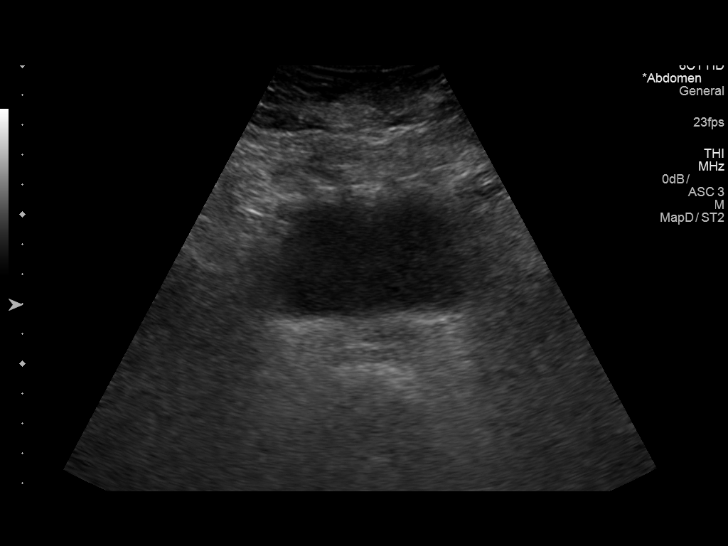
[im 41/41]
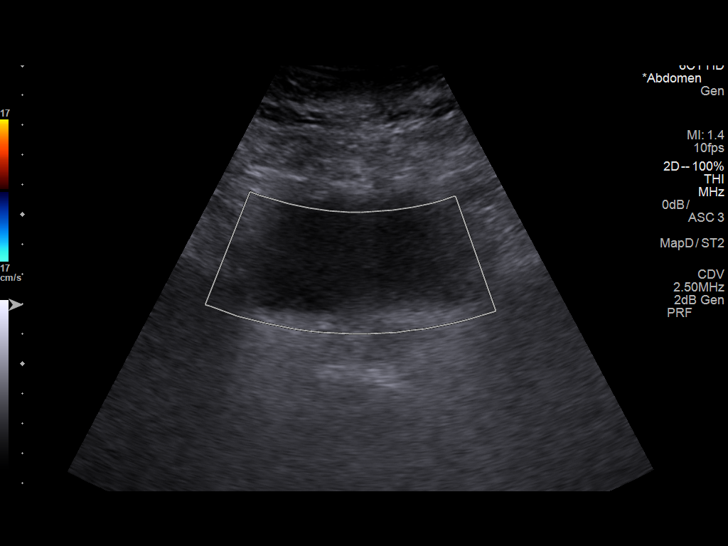

[14 of 25 positions shown; findings below may reference images not displayed]

FINDINGS: Right Kidney:

Renal measurements: 11.3 x 4.5 x 4.7 cm = volume: 127 mL. Increased
renal parenchymal echogenicity. Diffuse cortical thinning. No renal
masses, stones or hydronephrosis.

Left Kidney:

Renal measurements: 10.1 x 5.8 x 5.1 cm = volume: 159 mL. Borderline
increased renal parenchymal echogenicity. Mild diffuse renal
cortical thinning. No renal masses, stones or hydronephrosis.

Bladder:

Appears normal for degree of bladder distention.
IMPRESSION: 1. No acute findings.  No hydronephrosis.
2. Findings of medical renal disease with increased right and
borderline increased left renal parenchymal echogenicity and right
greater than left renal cortical thinning.

## 2020-12-13 MED ORDER — DENOSUMAB 60 MG/ML ~~LOC~~ SOSY
60.0000 mg | PREFILLED_SYRINGE | Freq: Once | SUBCUTANEOUS | Status: AC
  Administered 2020-12-13: 60 mg via SUBCUTANEOUS

## 2020-12-13 NOTE — Progress Notes (Signed)
I have reviewed and agree.

## 2020-12-13 NOTE — Patient Instructions (Addendum)
Pre visit review using our clinic review tool, if applicable. No additional management support is needed unless otherwise documented below in the visit note.  Change the dosage to 1 tablet daily except take 2 tablets on Mondays .  Re-check in 3 weeks.  **Prolia given at this visit.

## 2020-12-14 ENCOUNTER — Encounter: Payer: Self-pay | Admitting: Internal Medicine

## 2021-01-02 DIAGNOSIS — G4733 Obstructive sleep apnea (adult) (pediatric): Secondary | ICD-10-CM | POA: Diagnosis not present

## 2021-01-03 ENCOUNTER — Ambulatory Visit: Payer: Medicare PPO | Admitting: General Practice

## 2021-01-03 ENCOUNTER — Other Ambulatory Visit: Payer: Self-pay

## 2021-01-03 DIAGNOSIS — I483 Typical atrial flutter: Secondary | ICD-10-CM | POA: Diagnosis not present

## 2021-01-03 DIAGNOSIS — Z7901 Long term (current) use of anticoagulants: Secondary | ICD-10-CM

## 2021-01-03 DIAGNOSIS — H04123 Dry eye syndrome of bilateral lacrimal glands: Secondary | ICD-10-CM | POA: Diagnosis not present

## 2021-01-03 LAB — POCT INR: INR: 2.1 (ref 2.0–3.0)

## 2021-01-03 NOTE — Progress Notes (Signed)
I have reviewed and agree.

## 2021-01-03 NOTE — Patient Instructions (Addendum)
Pre visit review using our clinic review tool, if applicable. No additional management support is needed unless otherwise documented below in the visit note.  Continue to take 1 tablet daily except take 2 tablets on Mondays.  Recheck in 4 weeks. 

## 2021-01-08 ENCOUNTER — Ambulatory Visit (INDEPENDENT_AMBULATORY_CARE_PROVIDER_SITE_OTHER): Payer: Medicare PPO

## 2021-01-08 DIAGNOSIS — I442 Atrioventricular block, complete: Secondary | ICD-10-CM | POA: Diagnosis not present

## 2021-01-08 LAB — CUP PACEART REMOTE DEVICE CHECK
Battery Impedance: 1347 Ohm
Battery Remaining Longevity: 43 mo
Battery Voltage: 2.76 V
Brady Statistic AP VP Percent: 60 %
Brady Statistic AP VS Percent: 0 %
Brady Statistic AS VP Percent: 40 %
Brady Statistic AS VS Percent: 0 %
Date Time Interrogation Session: 20220607095301
Implantable Lead Implant Date: 20131210
Implantable Lead Implant Date: 20131210
Implantable Lead Location: 753858
Implantable Lead Location: 753859
Implantable Lead Model: 4296
Implantable Lead Model: 5076
Implantable Pulse Generator Implant Date: 20131210
Lead Channel Impedance Value: 436 Ohm
Lead Channel Impedance Value: 856 Ohm
Lead Channel Pacing Threshold Amplitude: 0.875 V
Lead Channel Pacing Threshold Amplitude: 1.625 V
Lead Channel Pacing Threshold Pulse Width: 0.4 ms
Lead Channel Pacing Threshold Pulse Width: 0.4 ms
Lead Channel Setting Pacing Amplitude: 2 V
Lead Channel Setting Pacing Amplitude: 3.25 V
Lead Channel Setting Pacing Pulse Width: 0.46 ms
Lead Channel Setting Sensing Sensitivity: 8 mV

## 2021-01-10 ENCOUNTER — Other Ambulatory Visit: Payer: Self-pay | Admitting: Cardiovascular Disease

## 2021-01-10 NOTE — Telephone Encounter (Signed)
Pt's pharmacy is requesting a refill on amoxicillin for a dental procedure. Would Dr. Johnsie Cancel like to refill this medication? Please address

## 2021-01-15 ENCOUNTER — Encounter: Payer: Self-pay | Admitting: Internal Medicine

## 2021-01-30 NOTE — Progress Notes (Signed)
Remote pacemaker transmission.   

## 2021-01-31 ENCOUNTER — Other Ambulatory Visit: Payer: Self-pay

## 2021-01-31 ENCOUNTER — Ambulatory Visit: Payer: Medicare PPO | Admitting: General Practice

## 2021-01-31 DIAGNOSIS — I483 Typical atrial flutter: Secondary | ICD-10-CM

## 2021-01-31 DIAGNOSIS — Z7901 Long term (current) use of anticoagulants: Secondary | ICD-10-CM

## 2021-01-31 LAB — POCT INR: INR: 2.2 (ref 2.0–3.0)

## 2021-01-31 NOTE — Progress Notes (Signed)
I have reviewed and agree.

## 2021-01-31 NOTE — Patient Instructions (Addendum)
Pre visit review using our clinic review tool, if applicable. No additional management support is needed unless otherwise documented below in the visit note. ? ?Continue to take 1 tablet daily except take 2 tablets on Mondays.  Re-check in 6 weeks. ?

## 2021-02-06 DIAGNOSIS — G4733 Obstructive sleep apnea (adult) (pediatric): Secondary | ICD-10-CM | POA: Diagnosis not present

## 2021-02-09 ENCOUNTER — Other Ambulatory Visit: Payer: Self-pay | Admitting: Internal Medicine

## 2021-02-09 DIAGNOSIS — F418 Other specified anxiety disorders: Secondary | ICD-10-CM

## 2021-02-09 DIAGNOSIS — F3341 Major depressive disorder, recurrent, in partial remission: Secondary | ICD-10-CM

## 2021-02-14 DIAGNOSIS — H04123 Dry eye syndrome of bilateral lacrimal glands: Secondary | ICD-10-CM | POA: Diagnosis not present

## 2021-02-14 DIAGNOSIS — H5340 Unspecified visual field defects: Secondary | ICD-10-CM | POA: Diagnosis not present

## 2021-02-21 ENCOUNTER — Ambulatory Visit: Payer: Medicare PPO | Admitting: Internal Medicine

## 2021-02-21 ENCOUNTER — Encounter: Payer: Self-pay | Admitting: Internal Medicine

## 2021-02-21 ENCOUNTER — Other Ambulatory Visit: Payer: Self-pay

## 2021-02-21 VITALS — BP 128/60 | HR 67 | Temp 98.5°F | Ht 63.0 in | Wt 145.0 lb

## 2021-02-21 DIAGNOSIS — I480 Paroxysmal atrial fibrillation: Secondary | ICD-10-CM

## 2021-02-21 DIAGNOSIS — R64 Cachexia: Secondary | ICD-10-CM

## 2021-02-21 NOTE — Progress Notes (Signed)
Subjective:  Patient ID: Holly Simmons, female    DOB: 10-23-1940  Age: 80 y.o. MRN: [id number]  CC: Anemia and Depression  This visit occurred during the SARS-CoV-2 public health emergency.  Safety protocols were in place, including screening questions prior to the visit, additional usage of staff PPE, and extensive cleaning of exam room while observing appropriate contact time as indicated for disinfecting solutions.    HPI Holly Simmons presents for f/up -  She went to the beach a couple weeks ago and since coming back she complains of the varicose veins around her ankles and feet are more prominent.  She denies pain, swelling, edema, rash, or cough.  Outpatient Medications Prior to Visit  Medication Sig Dispense Refill   allopurinol (ZYLOPRIM) 100 MG tablet TAKE 1 TABLET BY MOUTH DAILY. 90 tablet 1   amoxicillin (AMOXIL) 500 MG capsule TAKE 4 CAPSULES 1 HOUR PRIOR TO PROCEDURE 30 capsule 0   Betamethasone Valerate 0.12 % foam APPLY TO SCALP AS DIRECTED. 100 g 2   buPROPion (WELLBUTRIN XL) 300 MG 24 hr tablet TAKE 1 TABLET ONCE DAILY. 90 tablet 0   ciclopirox (LOPROX) 0.77 % SUSP APPLY 1 PUMP TWICE A DAY. 60 mL 0   dronabinol (MARINOL) 5 MG capsule TAKE 1 CAPSULE TWICE DAILY BEFORE LUNCH AND SUPPER. 180 capsule 1   famotidine (PEPCID) 40 MG tablet TAKE 1 TABLET ONCE DAILY. 90 tablet 1   ferrous sulfate 325 (65 FE) MG tablet Take 1 tablet (325 mg total) by mouth 2 (two) times daily with a meal. (Patient taking differently: Take 325 mg by mouth 3 (three) times daily with meals.) 180 tablet 1   fexofenadine (ALLEGRA) 180 MG tablet Take 180 mg by mouth daily as needed for allergies.     ipratropium (ATROVENT) 0.03 % nasal spray USE 2 SPRAYS IN EACH NOSTRIL 3 TIMES A DAY AS NEEDED. 30 mL 0   ketoconazole (NIZORAL) 2 % cream APPLY TO AFFECTED AREA TWICE A DAY. 60 g 0   lamoTRIgine (LAMICTAL) 25 MG tablet TAKE 1 TABLET ONCE DAILY. 90 tablet 0   metoprolol tartrate (LOPRESSOR)  25 MG tablet TAKE 1 TABLET BY MOUTH TWICE DAILY. 180 tablet 5   modafinil (PROVIGIL) 100 MG tablet Take 1 tablet (100 mg total) by mouth daily. 30 tablet 5   PRESCRIPTION MEDICATION CPAP     SYMBICORT 80-4.5 MCG/ACT inhaler USE 2 PUFFS TWICE A DAY. 30.6 g 5   torsemide (DEMADEX) 20 MG tablet TAKE 1 TABLET ONCE DAILY. 90 tablet 5   VIIBRYD 40 MG TABS TAKE 1 TABLET ONCE DAILY. 90 tablet 1   warfarin (COUMADIN) 2.5 MG tablet TAKE 1 TABLET DAILY EXCEPT 2 TABLETS ON MONDAY, WEDNESDAY, AND FRIDAYS 40 tablet 5   No facility-administered medications prior to visit.    ROS Review of Systems  Constitutional:  Positive for unexpected weight change (wt gain). Negative for diaphoresis and fatigue.  HENT: Negative.    Respiratory:  Negative for cough, chest tightness and wheezing.   Cardiovascular:  Negative for chest pain, palpitations and leg swelling.  Gastrointestinal:  Negative for abdominal pain, diarrhea and nausea.  Genitourinary: Negative.  Negative for difficulty urinating.  Musculoskeletal: Negative.   Skin: Negative.   Neurological: Negative.  Negative for dizziness, weakness, light-headedness and numbness.  Hematological:  Negative for adenopathy. Does not bruise/bleed easily.  Psychiatric/Behavioral: Negative.     Objective:  BP 128/60 (BP Location: Left Arm, Patient Position: Sitting, Cuff Size: Large)   Pulse 67  Temp 98.5 F (36.9 C) (Oral)   Ht 5\' 3"  (1.6 m)   Wt 145 lb (65.8 kg)   SpO2 97%   BMI 25.69 kg/m   BP Readings from Last 3 Encounters:  02/21/21 128/60  12/04/20 126/70  12/04/20 126/70    Wt Readings from Last 3 Encounters:  02/21/21 145 lb (65.8 kg)  12/07/20 136 lb (61.7 kg)  12/04/20 136 lb 3.2 oz (61.8 kg)    Physical Exam Vitals reviewed.  HENT:     Nose: Nose normal.     Mouth/Throat:     Mouth: Mucous membranes are moist.  Eyes:     Conjunctiva/sclera: Conjunctivae normal.  Cardiovascular:     Rate and Rhythm: Normal rate and regular  rhythm.     Heart sounds: Murmur heard.  Systolic murmur is present with a grade of 2/6.  Diastolic murmur is present with a grade of 1/4.  Pulmonary:     Breath sounds: No stridor. No wheezing, rhonchi or rales.  Abdominal:     General: Abdomen is flat.     Palpations: There is no mass.     Tenderness: There is no abdominal tenderness. There is no guarding.  Musculoskeletal:        General: Normal range of motion.     Cervical back: Neck supple.     Right lower leg: No edema.     Left lower leg: No edema.     Comments: There are uncomplicated varicosities over both distal lower extremities.  There is no edema, wounds, epidermal lesions, or erythema.  Lymphadenopathy:     Cervical: No cervical adenopathy.  Skin:    General: Skin is warm and dry.  Neurological:     General: No focal deficit present.     Mental Status: She is alert.  Psychiatric:        Mood and Affect: Mood normal.    Lab Results  Component Value Date   WBC 4.8 12/04/2020   HGB 12.0 12/04/2020   HCT 36.4 12/04/2020   PLT 131.0 (L) 12/04/2020   GLUCOSE 79 11/09/2020   CHOL 128 07/12/2020   TRIG 53.0 07/12/2020   HDL 56.30 07/12/2020   LDLCALC 61 07/12/2020   ALT 8 08/23/2020   AST 14 08/23/2020   NA 139 11/09/2020   K 3.8 11/09/2020   CL 99 11/09/2020   CREATININE 1.90 (H) 11/09/2020   BUN 36 (H) 11/09/2020   CO2 35 (H) 11/09/2020   TSH 2.53 07/12/2020   INR 2.2 01/31/2021   HGBA1C 5.3 03/07/2020   MICROALBUR 63.9 (H) 04/20/2018    DG ESOPHAGUS W SINGLE CM (SOL OR THIN BA)  Result Date: 10/11/2020 CLINICAL DATA:  80 year old female with history of dysphagia intermittently for the past 8 years. EXAM: ESOPHOGRAM/BARIUM SWALLOW TECHNIQUE: Single contrast examination was performed using  thin barium. FLUOROSCOPY TIME:  Fluoroscopy Time:  2.4 minutes Radiation Exposure Index (if provided by the fluoroscopic device): 13.1 mGy Number of Acquired Spot Images: 0 COMPARISON:  None. FINDINGS: Comment: Today's  study is limited by patient mobility and inability to follow all commands during the examination. With the above limitations in mind, single contrast images of the esophagus demonstrated a normal esophageal anatomy, without evidence of esophageal mass, stricture or esophageal ring. Multiple single swallow attempts demonstrated normal primary peristaltic waves. No tertiary contractions were observed. A barium tablet was administered, which passed readily into the stomach. IMPRESSION: 1. Within the limitations of today's examination, the examination was normal without explanation for  patient's history of dysphagia. Electronically Signed   By: Vinnie Langton M.D.   On: 10/11/2020 11:44    Assessment & Plan:   Derricka was seen today for anemia and depression.  Diagnoses and all orders for this visit:  Paroxysmal atrial fibrillation ([place])- She is maintaining sinus rhythm.  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  Will continue dronabinol.  I am having Miyani L. Bozard maintain her fexofenadine, PRESCRIPTION MEDICATION, modafinil, ipratropium, ketoconazole, ciclopirox, Symbicort, torsemide, metoprolol tartrate, warfarin, ferrous sulfate, Viibryd, dronabinol, famotidine, allopurinol, Betamethasone Valerate, amoxicillin, buPROPion, and lamoTRIgine.  No orders of the defined types were placed in this encounter.    Follow-up: No follow-ups on file.  Scarlette Calico, MD

## 2021-02-28 DIAGNOSIS — I4891 Unspecified atrial fibrillation: Secondary | ICD-10-CM | POA: Diagnosis not present

## 2021-02-28 DIAGNOSIS — R809 Proteinuria, unspecified: Secondary | ICD-10-CM | POA: Diagnosis not present

## 2021-02-28 DIAGNOSIS — D631 Anemia in chronic kidney disease: Secondary | ICD-10-CM | POA: Diagnosis not present

## 2021-02-28 DIAGNOSIS — N1832 Chronic kidney disease, stage 3b: Secondary | ICD-10-CM | POA: Diagnosis not present

## 2021-02-28 DIAGNOSIS — N189 Chronic kidney disease, unspecified: Secondary | ICD-10-CM | POA: Diagnosis not present

## 2021-02-28 DIAGNOSIS — N2581 Secondary hyperparathyroidism of renal origin: Secondary | ICD-10-CM | POA: Diagnosis not present

## 2021-02-28 DIAGNOSIS — I129 Hypertensive chronic kidney disease with stage 1 through stage 4 chronic kidney disease, or unspecified chronic kidney disease: Secondary | ICD-10-CM | POA: Diagnosis not present

## 2021-03-14 ENCOUNTER — Ambulatory Visit: Payer: Medicare PPO

## 2021-03-14 DIAGNOSIS — G4733 Obstructive sleep apnea (adult) (pediatric): Secondary | ICD-10-CM | POA: Diagnosis not present

## 2021-03-15 ENCOUNTER — Ambulatory Visit: Payer: Medicare PPO

## 2021-03-15 ENCOUNTER — Other Ambulatory Visit: Payer: Self-pay

## 2021-03-15 DIAGNOSIS — Z7901 Long term (current) use of anticoagulants: Secondary | ICD-10-CM | POA: Diagnosis not present

## 2021-03-15 DIAGNOSIS — I483 Typical atrial flutter: Secondary | ICD-10-CM

## 2021-03-15 LAB — POCT INR: INR: 3 (ref 2.0–3.0)

## 2021-03-15 NOTE — Patient Instructions (Addendum)
Pre visit review using our clinic review tool, if applicable. No additional management support is needed unless otherwise documented below in the visit note. ? ?Continue to take 1 tablet daily except take 2 tablets on Mondays.  Re-check in 6 weeks. ?

## 2021-03-19 NOTE — Progress Notes (Signed)
I have reviewed and agree.

## 2021-04-09 ENCOUNTER — Ambulatory Visit (INDEPENDENT_AMBULATORY_CARE_PROVIDER_SITE_OTHER): Payer: Medicare PPO

## 2021-04-09 DIAGNOSIS — I442 Atrioventricular block, complete: Secondary | ICD-10-CM

## 2021-04-09 LAB — CUP PACEART REMOTE DEVICE CHECK
Battery Impedance: 1514 Ohm
Battery Remaining Longevity: 38 mo
Battery Voltage: 2.76 V
Brady Statistic AP VP Percent: 63 %
Brady Statistic AP VS Percent: 0 %
Brady Statistic AS VP Percent: 36 %
Brady Statistic AS VS Percent: 0 %
Date Time Interrogation Session: 20220906090426
Implantable Lead Implant Date: 20131210
Implantable Lead Implant Date: 20131210
Implantable Lead Location: 753858
Implantable Lead Location: 753859
Implantable Lead Model: 4296
Implantable Lead Model: 5076
Implantable Pulse Generator Implant Date: 20131210
Lead Channel Impedance Value: 1058 Ohm
Lead Channel Impedance Value: 436 Ohm
Lead Channel Pacing Threshold Amplitude: 0.875 V
Lead Channel Pacing Threshold Amplitude: 1.875 V
Lead Channel Pacing Threshold Pulse Width: 0.4 ms
Lead Channel Pacing Threshold Pulse Width: 0.4 ms
Lead Channel Setting Pacing Amplitude: 2 V
Lead Channel Setting Pacing Amplitude: 3.75 V
Lead Channel Setting Pacing Pulse Width: 0.4 ms
Lead Channel Setting Sensing Sensitivity: 8 mV

## 2021-04-17 ENCOUNTER — Other Ambulatory Visit: Payer: Self-pay | Admitting: Internal Medicine

## 2021-04-17 ENCOUNTER — Other Ambulatory Visit: Payer: Self-pay | Admitting: Family Medicine

## 2021-04-17 DIAGNOSIS — Z1231 Encounter for screening mammogram for malignant neoplasm of breast: Secondary | ICD-10-CM

## 2021-04-17 NOTE — Progress Notes (Signed)
Remote pacemaker transmission.   

## 2021-04-25 ENCOUNTER — Ambulatory Visit: Payer: Medicare PPO

## 2021-04-25 ENCOUNTER — Other Ambulatory Visit: Payer: Self-pay

## 2021-04-25 DIAGNOSIS — Z7901 Long term (current) use of anticoagulants: Secondary | ICD-10-CM

## 2021-04-25 DIAGNOSIS — I483 Typical atrial flutter: Secondary | ICD-10-CM

## 2021-04-25 LAB — POCT INR: INR: 2 (ref 2.0–3.0)

## 2021-04-25 NOTE — Patient Instructions (Addendum)
Pre visit review using our clinic review tool, if applicable. No additional management support is needed unless otherwise documented below in the visit note. ? ?Continue to take 1 tablet daily except take 2 tablets on Mondays.  Re-check in 6 weeks. ?

## 2021-04-25 NOTE — Progress Notes (Signed)
I have reviewed and agree.

## 2021-05-04 ENCOUNTER — Telehealth: Payer: Self-pay

## 2021-05-04 NOTE — Telephone Encounter (Signed)
Last Prolia inj 12/13/20 Next Prolia inj due 06/16/21

## 2021-05-10 DIAGNOSIS — H5340 Unspecified visual field defects: Secondary | ICD-10-CM | POA: Diagnosis not present

## 2021-05-10 DIAGNOSIS — H04123 Dry eye syndrome of bilateral lacrimal glands: Secondary | ICD-10-CM | POA: Diagnosis not present

## 2021-05-15 ENCOUNTER — Other Ambulatory Visit: Payer: Self-pay | Admitting: Internal Medicine

## 2021-05-15 DIAGNOSIS — F3341 Major depressive disorder, recurrent, in partial remission: Secondary | ICD-10-CM

## 2021-05-15 DIAGNOSIS — Z7901 Long term (current) use of anticoagulants: Secondary | ICD-10-CM

## 2021-05-15 DIAGNOSIS — F418 Other specified anxiety disorders: Secondary | ICD-10-CM

## 2021-05-21 ENCOUNTER — Ambulatory Visit
Admission: RE | Admit: 2021-05-21 | Discharge: 2021-05-21 | Disposition: A | Discharge status: Home / Self Care | Payer: Medicare PPO | Source: Ambulatory Visit

## 2021-05-21 ENCOUNTER — Other Ambulatory Visit: Payer: Self-pay

## 2021-05-21 DIAGNOSIS — Z1231 Encounter for screening mammogram for malignant neoplasm of breast: Secondary | ICD-10-CM

## 2021-05-24 NOTE — Telephone Encounter (Signed)
Prior Auth required for Prolia  PA PROCESS DETAILS: PA is required. PA can be initiated by calling [phone] or online at [email or web address] 

## 2021-05-27 DIAGNOSIS — I351 Nonrheumatic aortic (valve) insufficiency: Secondary | ICD-10-CM

## 2021-05-28 ENCOUNTER — Telehealth: Payer: Self-pay | Admitting: Cardiovascular Disease

## 2021-05-28 NOTE — Telephone Encounter (Signed)
Patient wanted echo same day as her appointment. Patient will come in at 7:15 am for her echo and see Dr. Johnsie Cancel afterwards.

## 2021-05-28 NOTE — Telephone Encounter (Signed)
Son called stating someone call him this morning about his MyChat message he sent yesterday.

## 2021-05-28 NOTE — Telephone Encounter (Signed)
Josue Hector, MD  05/25/2020  7:55 AM EDT     Low normal EF moderate MR mild MS bioprosthetic TV good no VSD good Mild AS/AR f/u echo in a year    Order placed for echo. Will send message to scheduling to see if echo can be done before appointment in November.

## 2021-05-30 ENCOUNTER — Encounter: Payer: Self-pay | Admitting: Internal Medicine

## 2021-06-03 NOTE — Telephone Encounter (Signed)
Noted, will take care of the vaccinations at next apt.

## 2021-06-04 NOTE — Progress Notes (Signed)
[name]. Multiple complications with surgery at [facility] 06/23/11 by Dr Evelina Dun. 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 VSD closed no SAM Mild MR Mild TR Mild AR/AS   Echo10/21/21 reviewed:  Low normal EF moderate MR/mild MS , normal biophrosthetic TVR , VSD closed Mild AS/AR  Echo done today 06/14/2021 : reviewed AS mean gradient 10 peak 20 mmHg mild AR MAC VSD closed normal TVR and normal EF   She is seeing [facility] for CRF and has some chronic LE edema  She has received 3 Moderna vaccines Dermatologic surgery right front scalp healed  She has had some dysphagia and had normal barium swallow 10-27-2020   Husband died of kidney failure and some sort of paralysis  07/30/20. They had been married 20 years December is a tough month for her   ROS: Denies fever, malais, weight loss, blurry vision, decreased visual acuity, cough, sputum, SOB, hemoptysis, pleuritic pain, palpitaitons, heartburn, abdominal pain, melena, lower extremity edema, claudication, or rash.  All other systems reviewed and negative  General: BP 130/62   Pulse 90   Ht _0  (1.6 m)   Wt 149 lb (67.6 kg)   SpO2 96%   BMI 26.39 kg/m   Affect appropriate Elderly female  HEENT: normal Neck supple with no adenopathy JVP normal no bruits no thyromegaly Lungs clear with no wheezing and good diaphragmatic motion Heart:  S1/S2 no murmur, no rub, gallop or click PMI normal PPM epicardial generator abdomen and PPM CS under left clavicle Abdomen: benighn, BS positve, no tenderness, no AAA no bruit.  No HSM or HJR Distal pulses intact with no bruits Some varicosities in LE;s  Neuro non-focal Skin warm  and dry No muscular weakness   Current Outpatient Medications  Medication Sig Dispense Refill   allopurinol (ZYLOPRIM) 100 MG tablet TAKE 1 TABLET BY MOUTH DAILY. 90 tablet 1   amoxicillin (AMOXIL) 500 MG capsule TAKE 4 CAPSULES 1 HOUR PRIOR TO PROCEDURE 30 capsule 0   Betamethasone Valerate 0.12 % foam APPLY TO SCALP AS DIRECTED. 100 g 2   buPROPion (WELLBUTRIN XL) 300 MG 24 hr tablet TAKE 1 TABLET ONCE DAILY. 90 tablet 0   ciclopirox (LOPROX) 0.77 % SUSP APPLY 1 PUMP TWICE A DAY. 60 mL 0   dronabinol (MARINOL) 5 MG capsule TAKE ONE CAPSULE ONCE DAILY BEFORE LUNCH AND TAKE ONE CAPSULE BEFORE SUPPER 180 capsule 1   famotidine (PEPCID) 40 MG tablet TAKE ONE TABLET ONCE DAILY 90 tablet 1   ferrous sulfate 325 (65 FE) MG tablet Take 1 tablet (325 mg total) by mouth 2 (two) times daily with a meal. 180 tablet 1   ipratropium (ATROVENT) 0.03 % nasal spray USE 2 SPRAYS IN EACH NOSTRIL 3 TIMES A DAY AS NEEDED. 30 mL 0   ketoconazole (NIZORAL) 2 % cream APPLY TO AFFECTED AREA TWICE A DAY. 60 g 0   lamoTRIgine (LAMICTAL) 25 MG tablet TAKE 1 TABLET ONCE DAILY. 90 tablet 0   metoprolol tartrate (LOPRESSOR) 25 MG tablet TAKE 1 TABLET BY MOUTH TWICE DAILY. 180 tablet 5   PRESCRIPTION MEDICATION CPAP     SYMBICORT 80-4.5 MCG/ACT inhaler USE 2 PUFFS TWICE A DAY.  30.6 g 5   torsemide (DEMADEX) 20 MG tablet TAKE 1 TABLET ONCE DAILY. 90 tablet 5   Vilazodone HCl (VIIBRYD) 40 MG TABS TAKE ONE TABLET ONCE DAILY 90 tablet 1   warfarin (COUMADIN) 2.5 MG tablet TAKE 1 TABLET DAILY EXCEPT 2 TABLETS ON MONDAY, WEDNESDAY, AND FRIDAYS 40 tablet 1   fexofenadine (ALLEGRA) 180 MG tablet Take 180 mg by mouth daily as needed for allergies. (Patient not taking: Reported on 06/14/2021)     modafinil (PROVIGIL) 100 MG tablet Take 1 tablet (100 mg total) by mouth daily. (Patient not taking: Reported on 06/14/2021) 30 tablet 5   No current facility-administered medications for this visit.    Allergies  Petrolatum-zinc  oxide; Sulfa antibiotics; Sulfonamide derivatives; Tetanus toxoid; Tetanus toxoids; Other; Nsaids; and Tetanus toxoid, adsorbed  Electrocardiogram:  AV pacing 90   Assessment and Plan  HOCM: post myectomy no residual gradients  VSD now sealed with no VSD murmur on exam  Pacer  Thresholds ok f/u [facility] ? Changed to VVIR mode CS pacer Is under left clavicle and she has a 2nd abdominal device programmed as VVI backup  TV replacement  SBE low diastolic gradients and mild residual TR  Anticoagulation  Chronic afib INR Rx on coumadin no bleeding issues INR 2.0 on 04/25/21   A/CRF:  Baseline Cr around 1.6 f/u [facility]  Thrombocytopenia:  PLT 143 stable will need close watching on coumadin   F/U with EP check pacer in 6 months F/U with me in a year   Time spent reviewing notes from [place], echos [employer] forms direct patient interview and composing note 30 minutes   Jenkins Rouge

## 2021-06-05 IMAGING — DX DG CHEST 1V PORT
1 series · 1 of 1 positions shown · non-contrast
Comparison: November 17, 2017

CLINICAL DATA: Weakness.

EXAM:
PORTABLE CHEST 1 VIEW

[chest ap]
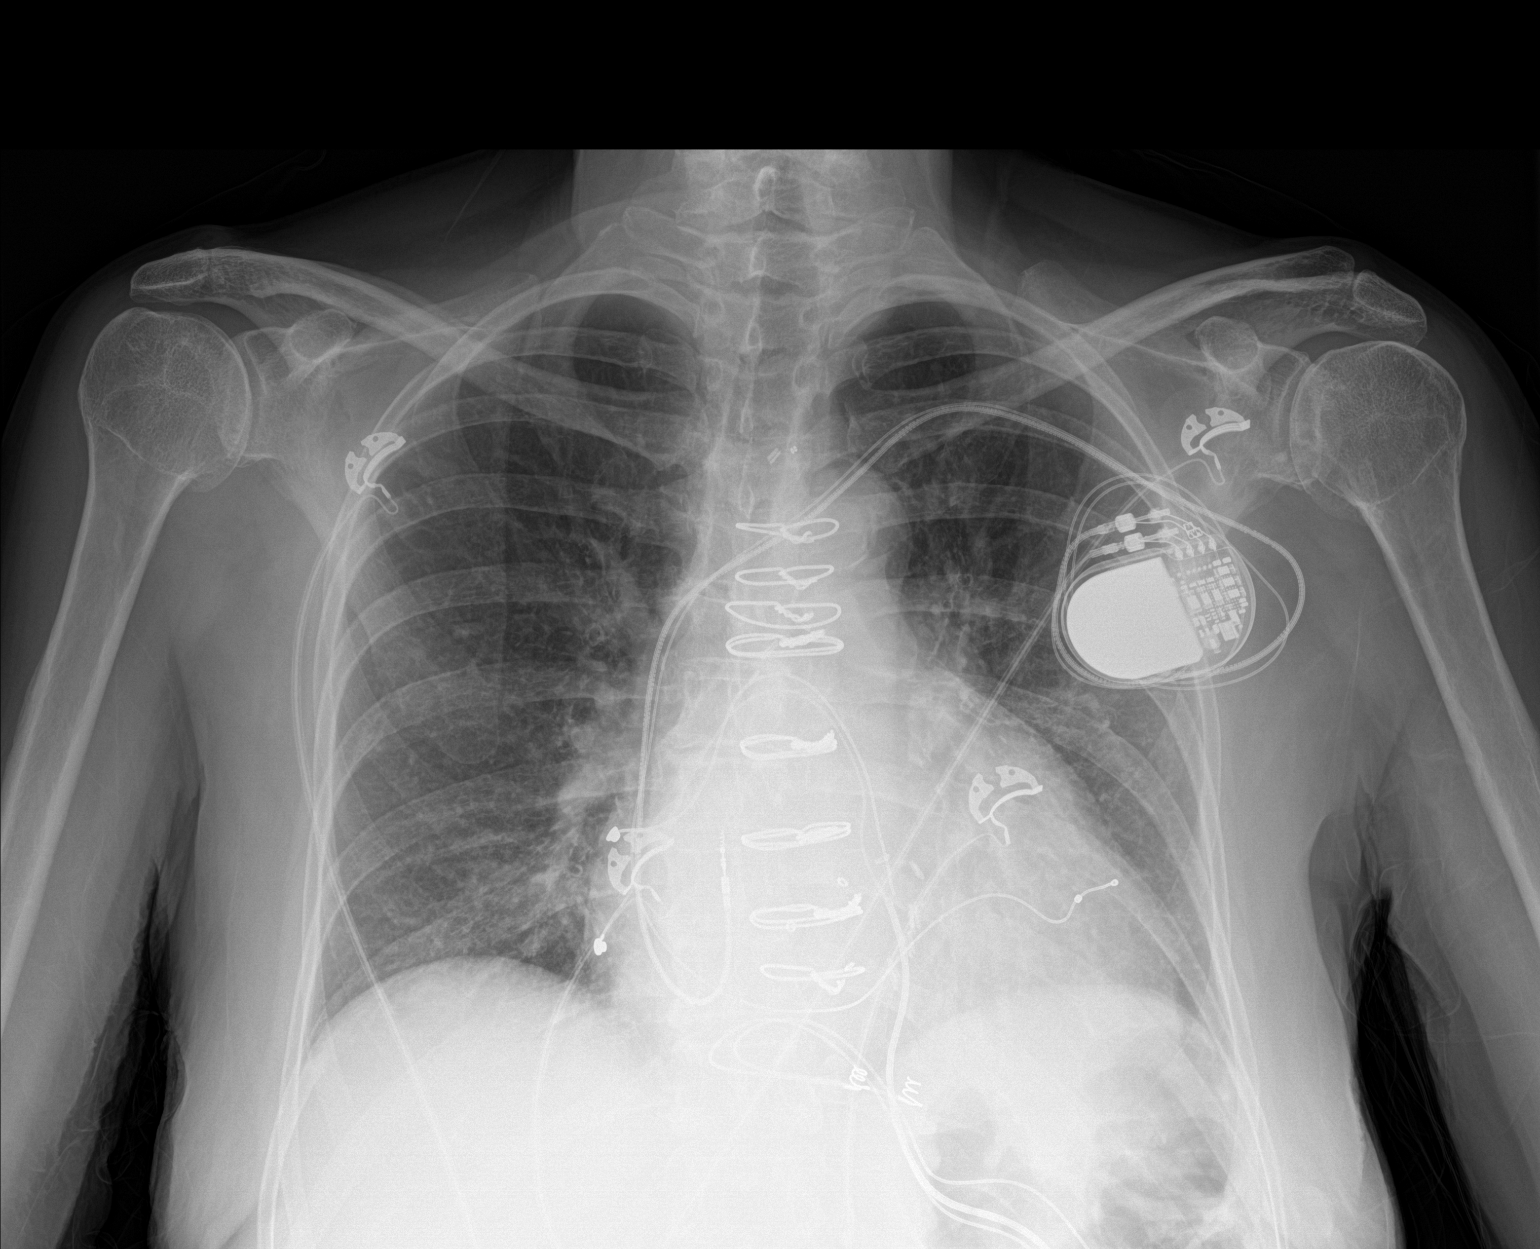

[1 of 1 positions shown; findings below may reference images not displayed]

FINDINGS: There is a multi lead AICD.

Multiple sternal wires are noted.

There is no evidence of acute infiltrate, pleural effusion or
pneumothorax.

The cardiac silhouette is mildly enlarged and unchanged in size.

The visualized skeletal structures are unremarkable.
IMPRESSION: Mild, stable cardiomegaly without acute or active cardiopulmonary
disease.

## 2021-06-06 ENCOUNTER — Ambulatory Visit: Payer: Medicare PPO

## 2021-06-06 ENCOUNTER — Other Ambulatory Visit: Payer: Self-pay

## 2021-06-06 DIAGNOSIS — Z23 Encounter for immunization: Secondary | ICD-10-CM | POA: Diagnosis not present

## 2021-06-06 DIAGNOSIS — Z7901 Long term (current) use of anticoagulants: Secondary | ICD-10-CM | POA: Diagnosis not present

## 2021-06-06 DIAGNOSIS — I483 Typical atrial flutter: Secondary | ICD-10-CM | POA: Diagnosis not present

## 2021-06-06 LAB — POCT INR: INR: 3.9 — AB (ref 2.0–3.0)

## 2021-06-06 NOTE — Progress Notes (Signed)
Hold dose today and then change weekly dose to take 1 tablet daily.  Re-check in 2 weeks.  Pt requested pneumonia, Pneumovax 23, vaccine and high dose flu vaccine. Administered flu and pneumonia vaccine. Pt tolerated well.

## 2021-06-06 NOTE — Patient Instructions (Addendum)
Pre visit review using our clinic review tool, if applicable. No additional management support is needed unless otherwise documented below in the visit note.  Hold dose today and then change weekly dose to take 1 tablet daily.  Re-check in 2 weeks.

## 2021-06-11 ENCOUNTER — Other Ambulatory Visit: Payer: Self-pay | Admitting: Internal Medicine

## 2021-06-11 DIAGNOSIS — R64 Cachexia: Secondary | ICD-10-CM

## 2021-06-11 DIAGNOSIS — K219 Gastro-esophageal reflux disease without esophagitis: Secondary | ICD-10-CM

## 2021-06-11 DIAGNOSIS — Z7901 Long term (current) use of anticoagulants: Secondary | ICD-10-CM

## 2021-06-13 ENCOUNTER — Other Ambulatory Visit (HOSPITAL_COMMUNITY): Payer: Medicare PPO

## 2021-06-14 ENCOUNTER — Encounter: Payer: Self-pay | Admitting: Cardiovascular Disease

## 2021-06-14 ENCOUNTER — Ambulatory Visit (HOSPITAL_COMMUNITY): Payer: Medicare PPO | Attending: Cardiology

## 2021-06-14 ENCOUNTER — Ambulatory Visit: Payer: Medicare PPO | Admitting: Cardiovascular Disease

## 2021-06-14 ENCOUNTER — Other Ambulatory Visit: Payer: Self-pay

## 2021-06-14 VITALS — BP 130/62 | HR 90 | Ht 63.0 in | Wt 149.0 lb

## 2021-06-14 DIAGNOSIS — I421 Obstructive hypertrophic cardiomyopathy: Secondary | ICD-10-CM | POA: Diagnosis not present

## 2021-06-14 DIAGNOSIS — I482 Chronic atrial fibrillation, unspecified: Secondary | ICD-10-CM | POA: Diagnosis not present

## 2021-06-14 DIAGNOSIS — Q2542 Hypoplasia of aorta: Secondary | ICD-10-CM | POA: Diagnosis not present

## 2021-06-14 DIAGNOSIS — Z5181 Encounter for therapeutic drug level monitoring: Secondary | ICD-10-CM | POA: Diagnosis not present

## 2021-06-14 DIAGNOSIS — Z7901 Long term (current) use of anticoagulants: Secondary | ICD-10-CM

## 2021-06-14 DIAGNOSIS — Q210 Ventricular septal defect: Secondary | ICD-10-CM | POA: Diagnosis not present

## 2021-06-14 DIAGNOSIS — I351 Nonrheumatic aortic (valve) insufficiency: Secondary | ICD-10-CM | POA: Diagnosis not present

## 2021-06-14 DIAGNOSIS — Z9889 Other specified postprocedural states: Secondary | ICD-10-CM | POA: Diagnosis not present

## 2021-06-14 LAB — ECHOCARDIOGRAM COMPLETE
AR max vel: 0.89 cm2
AV Area VTI: 0.92 cm2
AV Area mean vel: 0.86 cm2
AV Mean grad: 10.8 mmHg
AV Peak grad: 21.1 mmHg
Ao pk vel: 2.3 m/s
Area-P 1/2: 3.22 cm2
P 1/2 time: 483 msec
S' Lateral: 3.2 cm

## 2021-06-14 NOTE — Patient Instructions (Signed)
Medication Instructions:  *If you need a refill on your cardiac medications before your next appointment, please call your pharmacy*  Lab Work: If you have labs (blood work) drawn today and your tests are completely normal, you will receive your results only by: [place] (if you have MyChart) OR A paper copy in the mail If you have any lab test that is abnormal or we need to change your treatment, we will call you to review the results.  Testing/Procedures: None ordered today.  Follow-Up: At [facility], you and your health needs are our priority.  As part of our continuing mission to provide you with exceptional heart care, we have created designated Provider Care Teams.  These Care Teams include your primary Cardiologist (physician) and Advanced Practice Providers (APPs -  Physician Assistants and Nurse Practitioners) who all work together to provide you with the care you need, when you need it.  We recommend signing up for the patient portal called "MyChart".  Sign up information is provided on this After Visit Summary.  MyChart is used to connect with patients for Virtual Visits (Telemedicine).  Patients are able to view lab/test results, encounter notes, upcoming appointments, etc.  Non-urgent messages can be sent to your provider as well.   To learn more about what you can do with MyChart, go to [email or web address].    Your next appointment:   12 month(s)  The format for your next appointment:   In Person  Provider:   Jenkins Rouge, MD    Your physician recommends that you schedule a follow-up appointment in: 6 month with Dr. Quentin Ore, Dr. Rayann Heman patient.

## 2021-06-17 NOTE — Telephone Encounter (Signed)
Prior Auth for Prolia initiated via [email or web address] KEY: [id number] , Case ID: [id number]

## 2021-06-20 ENCOUNTER — Ambulatory Visit: Payer: Medicare PPO

## 2021-06-20 ENCOUNTER — Ambulatory Visit (INDEPENDENT_AMBULATORY_CARE_PROVIDER_SITE_OTHER): Payer: Medicare PPO

## 2021-06-20 ENCOUNTER — Other Ambulatory Visit: Payer: Self-pay

## 2021-06-20 VITALS — BP 130/62 | Ht 63.0 in | Wt 149.0 lb

## 2021-06-20 DIAGNOSIS — Z0000 Encounter for general adult medical examination without abnormal findings: Secondary | ICD-10-CM | POA: Diagnosis not present

## 2021-06-20 DIAGNOSIS — Z7901 Long term (current) use of anticoagulants: Secondary | ICD-10-CM | POA: Diagnosis not present

## 2021-06-20 DIAGNOSIS — I483 Typical atrial flutter: Secondary | ICD-10-CM

## 2021-06-20 LAB — POCT INR: INR: 3.1 — AB (ref 2.0–3.0)

## 2021-06-20 MED ORDER — WARFARIN SODIUM 1 MG PO TABS: 1.0000 mg | ORAL_TABLET | Freq: Every day | ORAL | 0 refills | Status: DC

## 2021-06-20 NOTE — Progress Notes (Signed)
Subjective:   Holly Simmons is a 80 y.o. female who presents for Medicare Annual (Subsequent) preventive examination.  Review of Systems     Cardiac Risk Factors include: advanced age (>51men, >34 women);dyslipidemia;family history of premature cardiovascular disease;hypertension;diabetes mellitus     Objective:    Today's Vitals   06/20/21 1623  BP: 130/62  Weight: 149 lb (67.6 kg)  Height: 5\' 3"  (1.6 m)  PainSc: 0-No pain   Body mass index is 26.39 kg/m.  Advanced Directives 06/20/2021 07/18/2020 07/12/2020 03/07/2020 10/18/2019 10/07/2019 02/19/2018  Does Patient Have a Medical Advance Directive? Yes No No Yes Yes Yes No  Type of Paramedic of [place];Living will - - - - Press photographer;Living will -  Does patient want to make changes to medical advance directive? No - Patient declined - - No - Patient declined - - -  Copy of Press photographer in Chart? - - - - - - -  Would patient like information on creating a medical advance directive? - No - Patient declined No - Patient declined - - - No - Patient declined  Pre-existing out of facility DNR order (yellow form or pink MOST form) - - - - - - -    Current Medications (verified) Outpatient Encounter Medications as of 06/20/2021  Medication Sig   allopurinol (ZYLOPRIM) 100 MG tablet TAKE 1 TABLET BY MOUTH DAILY.   amoxicillin (AMOXIL) 500 MG capsule TAKE 4 CAPSULES 1 HOUR PRIOR TO PROCEDURE   Betamethasone Valerate 0.12 % foam APPLY TO SCALP AS DIRECTED.   buPROPion (WELLBUTRIN XL) 300 MG 24 hr tablet TAKE 1 TABLET ONCE DAILY.   ciclopirox (LOPROX) 0.77 % SUSP APPLY 1 PUMP TWICE A DAY.   dronabinol (MARINOL) 5 MG capsule TAKE ONE CAPSULE ONCE DAILY BEFORE LUNCH AND TAKE ONE CAPSULE BEFORE SUPPER   famotidine (PEPCID) 40 MG tablet TAKE ONE TABLET ONCE DAILY   ferrous sulfate 325 (65 FE) MG tablet Take 1 tablet (325 mg total) by mouth 2 (two) times daily with a meal. (Patient  taking differently: Take 325 mg by mouth 3 (three) times daily with meals.)   fexofenadine (ALLEGRA) 180 MG tablet Take 180 mg by mouth daily as needed for allergies. (Patient not taking: Reported on 06/14/2021)   ipratropium (ATROVENT) 0.03 % nasal spray USE 2 SPRAYS IN EACH NOSTRIL 3 TIMES A DAY AS NEEDED.   ketoconazole (NIZORAL) 2 % cream APPLY TO AFFECTED AREA TWICE A DAY.   lamoTRIgine (LAMICTAL) 25 MG tablet TAKE 1 TABLET ONCE DAILY.   metoprolol tartrate (LOPRESSOR) 25 MG tablet TAKE 1 TABLET BY MOUTH TWICE DAILY.   modafinil (PROVIGIL) 100 MG tablet Take 1 tablet (100 mg total) by mouth daily. (Patient not taking: Reported on 06/14/2021)   PRESCRIPTION MEDICATION CPAP   SYMBICORT 80-4.5 MCG/ACT inhaler USE 2 PUFFS TWICE A DAY.   torsemide (DEMADEX) 20 MG tablet TAKE 1 TABLET ONCE DAILY.   Vilazodone HCl (VIIBRYD) 40 MG TABS TAKE ONE TABLET ONCE DAILY   warfarin (COUMADIN) 2.5 MG tablet TAKE 1 TABLET DAILY EXCEPT 2 TABLETS ON MONDAY, WEDNESDAY, AND FRIDAYS   [DISCONTINUED] Alum & Mag Hydroxide-Simeth (MAGIC MOUTHWASH) SOLN Take 5 mLs by mouth 3 (three) times daily. 14 days then stop    No facility-administered encounter medications on file as of 06/20/2021.    Allergies (verified) Petrolatum-zinc oxide; Sulfa antibiotics; Sulfonamide derivatives; Tetanus toxoid; Tetanus toxoids; Other; Nsaids; and Tetanus toxoid, adsorbed   History: Past Medical History:  Diagnosis Date   Allergic rhinitis    Asthma    NOS w/ acute exacerbation   Blood transfusion without reported diagnosis    CKD (chronic kidney disease) stage 3, GFR 30-59 ml/min (HCC) 09/12/2018   Colon polyps    TUBULAR ADENOMAS AND HYPERPLASTIC   Complete heart block (HCC)    requiring PPM (MDT) post surgical myomectomy at [facility],  leads are epicardial with abdominal implant, high ventricular threshold at implant   COPD (chronic obstructive pulmonary disease) (HCC)    Depressive disorder    Diastolic dysfunction    DM  (diabetes mellitus) ([place])    Gallstones    GERD (gastroesophageal reflux disease)    Gout 08/20/2012   Heart murmur    Hyperpotassemia    Hypersomnia    Hypertension    Hypertrophic cardiomyopathy ([place])    s/p surgical myomectomy at [facility] 47/82 complicated by septal VSD post procedure requiring reoperation with patch closure and tricuspid valve replacement   Kidney stones    Myocardial infarction ([place]) 2011   Obstructive sleep apnea    persistent daytime sleepiness despite cpap   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   Psoriasis    Pyuria    Renal insufficiency    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   Typical atrial flutter ([place]) 9/15   Past Surgical History:  Procedure Laterality Date   ABDOMINAL HYSTERECTOMY     APPENDECTOMY     BREAST CYST EXCISION     CARDIOVERSION N/A 08/01/2014   Procedure: CARDIOVERSION;  Surgeon: Thayer Headings, MD;  Location: [place];  Service: Cardiovascular;  Laterality: N/A;   CESAREAN SECTION     CHOLECYSTECTOMY     COLONOSCOPY     EYE SURGERY  [id number]   left eye lens implant   EYE SURGERY  [id number]   right eye lens implant   INSERT / REPLACE / REMOVE PACEMAKER  07/13/2012   PACEMAKER INSERTION  07/02/11   epicardial wires with abdominal implant at [facility] 12/12,  high ventricular lead threshold at implant per Dr Westley Gambles   PERMANENT PACEMAKER INSERTION N/A 07/13/2012   Procedure: PERMANENT PACEMAKER INSERTION;  Surgeon: Thompson Grayer, MD;  Location: [facility] CATH LAB;  Service: Cardiovascular;  Laterality: N/A;   POLYPECTOMY     septal myomectomy for hypertrophic CM  06/23/11   by Dr Evelina Dun at [facility], complicated by septal VSD requiring patch repair and tricuspid valve replacement   TONSILLECTOMY     TRICUSPID VALVE REPLACEMENT  11/12   Medtronic 7mm Mosaic tissue valve   VSD REPAIR  06/2011   Family History  Problem Relation Age of Onset   Hypertension Daughter    Heart disease Maternal Grandfather    Heart disease Paternal Grandfather     Bladder Cancer Father    Prostate cancer Father    Cancer Father    Varicose Veins Father    Heart attack Father    Breast cancer Mother    Dementia Mother    Hypertension Mother    Cancer Mother    Ovarian cancer Maternal Aunt    Pancreatic cancer Cousin    Breast cancer Paternal Aunt    Dementia Maternal Aunt        x 2   Dementia Maternal Uncle        x 2   Colon cancer Neg Hx    Rectal cancer Neg Hx    Stomach cancer Neg Hx    Esophageal cancer Neg Hx    Social History  Socioeconomic History   Marital status: Widowed    Spouse name: [name]   Number of children: 3   Years of education: Not on file   Highest education level: Not on file  Occupational History   Occupation: retired    Fish farm manager: RETIRED  Tobacco Use   Smoking status: Never   Smokeless tobacco: Never  Vaping Use   Vaping Use: Never used  Substance and Sexual Activity   Alcohol use: No    Alcohol/week: 0.0 standard drinks   Drug use: No   Sexual activity: Not on file  Other Topics Concern   Not on file  Social History Narrative   ** Merged History Encounter **       Social Determinants of Health   Financial Resource Strain: Low Risk    Difficulty of Paying Living Expenses: Not hard at all  Food Insecurity: No Food Insecurity   Worried About Charity fundraiser in the Last Year: Never true   [place] in the Last Year: Never true  Transportation Needs: No Transportation Needs   Lack of Transportation (Medical): No   Lack of Transportation (Non-Medical): No  Physical Activity: Inactive   Days of Exercise per Week: 0 days   Minutes of Exercise per Session: 0 min  Stress: No Stress Concern Present   Feeling of Stress : Not at all  Social Connections: Moderately Integrated   Frequency of Communication with Friends and Family: More than three times a week   Frequency of Social Gatherings with Friends and Family: More than three times a week   Attends Religious Services: More than 4  times per year   Active Member of [employer] or Organizations: Yes   Attends Archivist Meetings: More than 4 times per year   Marital Status: Widowed    Tobacco Counseling Counseling given: Not Answered   Clinical Intake:  Pre-visit preparation completed: Yes  Pain : No/denies pain Pain Score: 0-No pain     BMI - recorded: 26.39 Nutritional Status: BMI 25 -29 Overweight Nutritional Risks: None Diabetes: Yes CBG done?: No Did pt. bring in CBG monitor from home?: No  How often do you need to have someone help you when you read instructions, pamphlets, or other written materials from your doctor or pharmacy?: 1 - Never What is the last grade level you completed in school?: Bachelor's Degree  Diabetic? yes  Interpreter Needed?: No  Information entered by :: Lisette Abu, LPN   Activities of Daily Living In your present state of health, do you have any difficulty performing the following activities: 06/20/2021  Hearing? Y  Comment wears hearing aids  Vision? N  Difficulty concentrating or making decisions? Y  Walking or climbing stairs? Y  Dressing or bathing? N  Doing errands, shopping? Y  Preparing Food and eating ? Y  Using the Toilet? N  In the past six months, have you accidently leaked urine? N  Comment wears protection  Do you have problems with loss of bowel control? N  Managing your Medications? N  Managing your Finances? N  Housekeeping or managing your Housekeeping? Y  Some recent data might be hidden    Patient Care Team: Janith Lima, MD as PCP - General (Internal Medicine) Josue Hector, MD as PCP - Cardiology (Cardiology) Janith Lima, MD Jola Schmidt, MD as Consulting Physician (Ophthalmology)  Indicate any recent Medical Services you may have received from other than [facility] providers in the past year (date may  be approximate).     Assessment:   This is a routine wellness examination for [place].  Hearing/Vision  screen Hearing Screening - Comments:: Patient wears hearing aids. Vision Screening - Comments:: Patient wears eyeglasses.  Cataracts removed and eye exam done by Dr. Jola Schmidt.  Dietary issues and exercise activities discussed: Current Exercise Habits: The patient does not participate in regular exercise at present, Exercise limited by: cardiac condition(s);orthopedic condition(s);respiratory conditions(s);psychological condition(s)   Goals Addressed   None   Depression Screen PHQ 2/9 Scores 06/20/2021 02/21/2021 12/04/2020 07/12/2020 03/07/2020 02/15/2019 07/20/2018  PHQ - 2 Score 4 6 0 0 0 1 4  PHQ- 9 Score - 16 0 0 - 3 9  Exception Documentation - - - - Medical reason - -    Fall Risk Fall Risk  06/20/2021 03/07/2020 01/09/2020 02/15/2019 07/20/2018  Falls in the past year? 0 1 0 1 0  Number falls in past yr: 0 1 1 1  0  Injury with Fall? 0 0 1 0 0  Risk Factor Category  - - - - -  Risk for fall due to : Impaired balance/gait Impaired balance/gait;Impaired mobility;Mental status change Impaired balance/gait;Impaired mobility;Mental status change Impaired balance/gait;Impaired mobility -  Follow up Falls evaluation completed Falls evaluation completed Falls evaluation completed Falls evaluation completed;Education provided Falls evaluation completed    FALL RISK PREVENTION PERTAINING TO THE HOME:  Any stairs in or around the home? Yes  If so, are there any without handrails? No  Home free of loose throw rugs in walkways, pet beds, electrical cords, etc? Yes  Adequate lighting in your home to reduce risk of falls? Yes   ASSISTIVE DEVICES UTILIZED TO PREVENT FALLS:  Life alert? No  Use of a cane, walker or w/c? Yes  Grab bars in the bathroom? No  Shower chair or bench in shower? Yes  Elevated toilet seat or a handicapped toilet? Yes   TIMED UP AND GO:  Was the test performed? Yes .  Length of time to ambulate 10 feet: 12 sec.   Gait slow and steady with assistive  device  Cognitive Function: Normal cognitive status assessed by direct observation by this Nurse Health Advisor. No abnormalities found.   MMSE - Mini Mental State Exam 12/27/2014  Not completed: (No Data)     6CIT Screen 03/07/2020  What Year? 0 points  What month? 0 points  What time? 3 points  Count back from 20 0 points  Months in reverse 2 points  Repeat phrase 10 points  Total Score 15    Immunizations Immunization History  Administered Date(s) Administered   Fluad Quad(high Dose 65+) 04/04/2019, 06/06/2021   Influenza Split 05/14/2011, 05/12/2012   Influenza Whole 05/02/2009, 04/24/2010   Influenza, High Dose Seasonal PF 04/15/2016, 05/06/2017, 04/20/2018   Influenza,inj,Quad PF,6+ Mos 04/13/2013, 04/19/2014, 05/02/2015   Influenza-Unspecified 04/25/2020   Moderna Sars-Covid-2 Vaccination 08/07/2019, 09/14/2019, 05/30/2020, 01/15/2021   Pneumococcal Conjugate-13 01/18/2014   Pneumococcal Polysaccharide-23 08/10/2008, 05/30/2015, 06/06/2021   Varicella 03/11/2006   Zoster Recombinat (Shingrix) 12/29/2017, 04/02/2018   Zoster, Live 08/04/2014    TDAP status: Due, Education has been provided regarding the importance of this vaccine. Advised may receive this vaccine at local pharmacy or Health Dept. Aware to provide a copy of the vaccination record if obtained from local pharmacy or Health Dept. Verbalized acceptance and understanding.  Flu Vaccine status: Up to date  Pneumococcal vaccine status: Up to date  Covid-19 vaccine status: Completed vaccines  Qualifies for Shingles Vaccine? Yes  Zostavax completed Yes   Shingrix Completed?: Yes  Screening Tests Health Maintenance  Topic Date Due   Hepatitis C Screening  Never done   URINE MICROALBUMIN  04/21/2019   FOOT EXAM  11/23/2019   HEMOGLOBIN A1C  09/07/2020   COVID-19 Vaccine (5 - Booster for Moderna series) 03/12/2021   OPHTHALMOLOGY EXAM  10/04/2021   COLONOSCOPY (Pts 45-53yrs Insurance coverage will need  to be confirmed)  09/14/2025   Pneumonia Vaccine 41+ Years old  Completed   INFLUENZA VACCINE  Completed   DEXA SCAN  Completed   Zoster Vaccines- Shingrix  Completed   HPV VACCINES  Aged Out    Health Maintenance  Health Maintenance Due  Topic Date Due   Hepatitis C Screening  Never done   URINE MICROALBUMIN  04/21/2019   FOOT EXAM  11/23/2019   HEMOGLOBIN A1C  09/07/2020   COVID-19 Vaccine (5 - Booster for Moderna series) 03/12/2021    Colorectal cancer screening: No longer required.   Mammogram status: Completed 05/21/2021. Repeat every year  Bone Density Status: completed as of 06/20/2015  Lung Cancer Screening: (Low Dose CT Chest recommended if Age 100-80 years, 30 pack-year currently smoking OR have quit w/in 15years.) does not qualify.   Lung Cancer Screening Referral: no  Additional Screening:  Hepatitis C Screening: does qualify; Completed no  Vision Screening: Recommended annual ophthalmology exams for early detection of glaucoma and other disorders of the eye. Is the patient up to date with their annual eye exam?  Yes  Who is the provider or what is the name of the office in which the patient attends annual eye exams? Jola Schmidt, MD If pt is not established with a provider, would they like to be referred to a provider to establish care? No .   Dental Screening: Recommended annual dental exams for proper oral hygiene  Community Resource Referral / Chronic Care Management: CRR required this visit?  No   CCM required this visit?  No      Plan:     I have personally reviewed and noted the following in the patient's chart:   Medical and social history Use of alcohol, tobacco or illicit drugs  Current medications and supplements including opioid prescriptions.  Functional ability and status Nutritional status Physical activity Advanced directives List of other physicians Hospitalizations, surgeries, and ER visits in previous 12  months Vitals Screenings to include cognitive, depression, and falls Referrals and appointments  In addition, I have reviewed and discussed with patient certain preventive protocols, quality metrics, and best practice recommendations. A written personalized care plan for preventive services as well as general preventive health recommendations were provided to patient.     Sheral Flow, LPN   05/39/7673   Nurse Notes:  Hearing Screening - Comments:: Patient wears hearing aids. Vision Screening - Comments:: Patient wears eyeglasses.  Cataracts removed and eye exam done by Dr. Jola Schmidt.

## 2021-06-20 NOTE — Patient Instructions (Addendum)
Pre visit review using our clinic review tool, if applicable. No additional management support is needed unless otherwise documented below in the visit note.  Decrease dose today to 1/2 tablet and then change weekly dose to take 1 tablet daily except take 1/2 tablet on Wednesdays.  Re-check in 4 weeks

## 2021-06-20 NOTE — Progress Notes (Addendum)
Decrease dose today to 1/2 tablet and then change weekly dose to take 1 tablet daily except take 1/2 tablet on Wednesdays.  Re-check in 4 weeks

## 2021-06-29 NOTE — Telephone Encounter (Signed)
Pt ready for scheduling on or after 06/16/21  Out-of-pocket cost due at time of visit: $0.00  Primary: [employer] Prolia co-insurance: 0% Admin fee co-insurance: 0%  Secondary: n/a Prolia co-insurance:  Admin fee co-insurance:   Deductible: does not apply  Prior Auth: APPROVED PA# [id number] Valid: 10/26/19-08/03/22    ** This summary of benefits is an estimation of the patient's out-of-pocket cost. Exact cost may very based on individual plan coverage.

## 2021-06-30 ENCOUNTER — Encounter: Payer: Self-pay | Admitting: Internal Medicine

## 2021-07-09 ENCOUNTER — Ambulatory Visit (INDEPENDENT_AMBULATORY_CARE_PROVIDER_SITE_OTHER): Payer: Medicare PPO

## 2021-07-09 DIAGNOSIS — I442 Atrioventricular block, complete: Secondary | ICD-10-CM | POA: Diagnosis not present

## 2021-07-09 LAB — CUP PACEART REMOTE DEVICE CHECK
Battery Impedance: 1628 Ohm
Battery Remaining Longevity: 35 mo
Battery Voltage: 2.75 V
Brady Statistic AP VP Percent: 66 %
Brady Statistic AP VS Percent: 0 %
Brady Statistic AS VP Percent: 34 %
Brady Statistic AS VS Percent: 0 %
Date Time Interrogation Session: 20221206091212
Implantable Lead Implant Date: 20131210
Implantable Lead Implant Date: 20131210
Implantable Lead Location: 753858
Implantable Lead Location: 753859
Implantable Lead Model: 4296
Implantable Lead Model: 5076
Implantable Pulse Generator Implant Date: 20131210
Lead Channel Impedance Value: 1194 Ohm
Lead Channel Impedance Value: 448 Ohm
Lead Channel Pacing Threshold Amplitude: 0.875 V
Lead Channel Pacing Threshold Amplitude: 1.875 V
Lead Channel Pacing Threshold Pulse Width: 0.4 ms
Lead Channel Pacing Threshold Pulse Width: 0.4 ms
Lead Channel Setting Pacing Amplitude: 2 V
Lead Channel Setting Pacing Amplitude: 3.75 V
Lead Channel Setting Pacing Pulse Width: 0.46 ms
Lead Channel Setting Sensing Sensitivity: 8 mV

## 2021-07-15 ENCOUNTER — Telehealth: Payer: Self-pay | Admitting: *Deleted

## 2021-07-15 NOTE — Chronic Care Management (AMB) (Signed)
  Chronic Care Management   Note  07/15/2021 Name: Holly Simmons MRN: [id number] DOB: [birth year]  Holly Simmons is a 80 y.o. year old female who is a primary care patient of Janith Lima, MD. I reached out to Jon Gills by phone today in response to a referral sent by Ms. Chunky PCP.  Ms. Mesler was given information about Chronic Care Management services today including:  CCM service includes personalized support from designated clinical staff supervised by her physician, including individualized plan of care and coordination with other care providers 24/7 contact phone numbers for assistance for urgent and routine care needs. Service will only be billed when office clinical staff spend 20 minutes or more in a month to coordinate care. Only one practitioner may furnish and bill the service in a calendar month. The patient may stop CCM services at any time (effective at the end of the month) by phone call to the office staff. The patient is responsible for co-pay (up to 20% after annual deductible is met) if co-pay is required by the individual health plan.   Patient agreed to services and verbal consent obtained.   Follow up plan: Telephone appointment with care management team member scheduled for:07/26/21  [place]: [phone]

## 2021-07-18 ENCOUNTER — Other Ambulatory Visit: Payer: Self-pay

## 2021-07-18 ENCOUNTER — Ambulatory Visit: Payer: Medicare PPO

## 2021-07-18 DIAGNOSIS — M80071D Age-related osteoporosis with current pathological fracture, right ankle and foot, subsequent encounter for fracture with routine healing: Secondary | ICD-10-CM

## 2021-07-18 DIAGNOSIS — Z7901 Long term (current) use of anticoagulants: Secondary | ICD-10-CM | POA: Diagnosis not present

## 2021-07-18 DIAGNOSIS — M80071S Age-related osteoporosis with current pathological fracture, right ankle and foot, sequela: Secondary | ICD-10-CM

## 2021-07-18 LAB — POCT INR: INR: 2.5 (ref 2.0–3.0)

## 2021-07-18 MED ORDER — DENOSUMAB 60 MG/ML ~~LOC~~ SOSY
60.0000 mg | PREFILLED_SYRINGE | Freq: Once | SUBCUTANEOUS | Status: AC
Start: 2021-07-18 — End: 2021-07-18
  Administered 2021-07-18: 60 mg via SUBCUTANEOUS

## 2021-07-18 NOTE — Telephone Encounter (Signed)
Pt was in coumadin clinic today and was due for her Prolia injection. Prolia injection was given today, 07/18/21.

## 2021-07-18 NOTE — Patient Instructions (Addendum)
Pre visit review using our clinic review tool, if applicable. No additional management support is needed unless otherwise documented below in the visit note.  Continue 1 tablet of the 2.5 mg tablet daily except take 1 1/2 tablet of the 1 mg tablet on Wednesdays.  Re-check in 6 weeks. 

## 2021-07-18 NOTE — Progress Notes (Signed)
Remote pacemaker transmission.   

## 2021-07-18 NOTE — Progress Notes (Addendum)
Continue 1 tablet of the 2.5 mg tablet daily except take 1 1/2 tablet of the 1 mg tablet on Wednesdays.  Re-check in 6 weeks per pt's son's request.   Per orders of Dr. Ronnald Ramp, injection of Prolia given in L arm by Randall An. Patient tolerated injection well.

## 2021-07-26 ENCOUNTER — Ambulatory Visit (INDEPENDENT_AMBULATORY_CARE_PROVIDER_SITE_OTHER): Payer: Medicare PPO | Admitting: *Deleted

## 2021-07-26 DIAGNOSIS — I480 Paroxysmal atrial fibrillation: Secondary | ICD-10-CM

## 2021-07-26 DIAGNOSIS — I5032 Chronic diastolic (congestive) heart failure: Secondary | ICD-10-CM

## 2021-07-26 NOTE — Patient Instructions (Signed)
Visit Information   [name], thank you for taking time to talk with me today about Holly Simmons's health care needs. Please don't hesitate to contact me if I can be of assistance to you before our next scheduled telephone appointment.  Below are the goals we discussed today:  Patient Self-Care Activities: Patient Ross will: Take medications as prescribed Attend all scheduled provider appointments Call pharmacy for medication refills Call provider office for new concerns or questions Take efforts to to follow heart healthy, low salt, low cholesterol diet Continue efforts to prevent falls- use your walker regularly  Our next scheduled telephone follow up visit/ appointment with care management team member is scheduled on:  Friday, August 30, 2021 at 3:00 pm  If you need to cancel or re-schedule our visit, please call [phone] and our care guide team will be happy to assist you.   I look forward to hearing about your progress.   Oneta Rack, RN, BSN, [place] [phone]: direct office  If you are experiencing a Mental Health or [place] or need someone to talk to, please  call the Suicide and Crisis Lifeline: 988 call the [place] National Suicide Prevention Lifeline: 2560216931 or TTY: (901)638-0457 TTY 734-071-2283) to talk to a trained counselor call 1-800-273-TALK (toll free, 24 hour hotline) go to [facility] [phone]) call 911   Dysphagia Dysphagia is trouble swallowing. This condition occurs when solids and liquids stick in a person's throat on the way down to the stomach, or when food takes longer to get to the stomach than usual. You may have problems swallowing food, liquids, or both. You may also have pain while trying to swallow. It may take you more time and effort to swallow something. What are the causes? This  condition may be caused by: Muscle problems. These may make it difficult for you to move food and liquids through the esophagus, which is the tube that connects your mouth to your stomach. Blockages. You may have ulcers, scar tissue, or inflammation that blocks the normal passage of food and liquids. Causes of these problems include: Acid reflux from your stomach into your esophagus (gastroesophageal reflux). Infections. Radiation treatment for cancer. Medicines taken without enough fluids to wash them down into your stomach. Stroke. This can affect the nerves and make it difficult to swallow. Nerve problems. These prevent signals from being sent to the muscles of your esophagus to squeeze (contract) and move what you swallow down to your stomach. Globus pharyngeus. This is a common problem that involves a feeling like something is stuck in your throat or a sense of trouble with swallowing, even though nothing is wrong with the swallowing passages. Certain conditions, such as cerebral palsy or Parkinson's disease. What are the signs or symptoms? Common symptoms of this condition include: A feeling that solids or liquids are stuck in your throat on the way down to the stomach. Pain while swallowing. Coughing or gagging while trying to swallow. Other symptoms include: Food moving back from your stomach to your mouth (regurgitation). Noises coming from your throat. Chest discomfort when swallowing. A feeling of fullness when swallowing. Drooling, especially when the throat is blocked. Heartburn. How is this diagnosed? This condition may be diagnosed by: Barium swallow X-ray. In this test, you will swallow a white liquid that sticks to the inside of your esophagus. X-ray images are then taken. Endoscopy. In this test, a flexible  telescope is inserted down your throat to look at your esophagus and your stomach. CT scans or an MRI. How is this treated? Treatment for dysphagia depends on the  cause of this condition: If the dysphagia is caused by acid reflux or infection, medicines may be used. These may include antibiotics or heartburn medicines. If the dysphagia is caused by problems with the muscles, swallowing therapy may be used to help you strengthen your swallowing muscles. You may have to do specific exercises to strengthen the muscles or stretch them. If the dysphagia is caused by a blockage or mass, procedures to remove the blockage may be done. You may need surgery and a feeding tube. You may need to make diet changes. Ask your health care provider for specific instructions. Follow these instructions at home: Medicines Take over-the-counter and prescription medicines only as told by your health care provider. If you were prescribed an antibiotic medicine, take it as told by your health care provider. Do not stop taking the antibiotic even if you start to feel better. Eating and drinking  Make any diet changes as told by your health care provider. Work with a diet and nutrition specialist (dietitian) to create an eating plan that will help you get the nutrients you need in order to stay healthy. Eat soft foods that are easier to swallow. Cut your food into small pieces and eat slowly. Take small bites. Eat and drink only when you are sitting upright. Do not drink alcohol or caffeine. If you need help quitting, ask your health care provider. General instructions Check your weight every day to make sure you are not losing weight. Do not use any products that contain nicotine or tobacco. These products include cigarettes, chewing tobacco, and vaping devices, such as e-cigarettes. If you need help quitting, ask your health care provider. Keep all follow-up visits. This is important. Contact a health care provider if: You lose weight because you cannot swallow. You cough when you drink liquids. You cough up partially digested food. Get help right away if: You cannot swallow  your saliva. You have shortness of breath, a fever, or both. Your voice is hoarse and you have trouble swallowing. These symptoms may represent a serious problem that is an emergency. Do not wait to see if the symptoms will go away. Get medical help right away. Call your local emergency services (911 in the [place] Do not drive yourself to the hospital. Summary Dysphagia is trouble swallowing. This condition occurs when solids and liquids stick in a person's throat on the way down to the stomach. You may cough or gag while trying to swallow. Dysphagia has many possible causes. Treatment for dysphagia depends on the cause of the condition. Keep all follow-up visits. This is important. This information is not intended to replace advice given to you by your health care provider. Make sure you discuss any questions you have with your health care provider. Document Revised: 03/10/2020 Document Reviewed: 03/10/2020 Elsevier Patient Education  2022 [place].  Dysphagia Eating Plan, Bite Size Food This eating plan is for people with moderate swallowing problems who have transitioned from pureed and minced foods. Bite size foods are soft and cut into small chunks so that they can be swallowed safely. On this eating plan, you may be instructed to drink liquids that are thickened. Work with your health care provider and your diet and nutrition specialist (dietitian) to make sure that you are following the eating plan safely and getting all the nutrients  you need. What are tips for following this plan? General information  You may eat foods that are tender, soft, and moist. Always test food texture before taking a bite. Poke food with a fork or spoon to make sure it is tender. The test sample should squash, break apart, or change shape, and it should not return to its original shape when the fork or spoon is removed. Food should be easy to cut and chew. Avoid large pieces of food that require a lot of  chewing. Take small bites. Each bite should be smaller than your thumb nail (about 15 mm by 15 mm). If you were on a pureed or minced food eating plan, you may eat any of the foods included in those diets. Avoid foods that are very dry, hard, sticky, chewy, coarse, or crunchy. If instructed by your health care provider, thicken liquids. Follow your health care provider's instructions for what products to use, how to do this, and to what thickness. Your health care provider may recommend using a commercial thickener, rice cereal, or potato flakes. Ask your health care provider to recommend thickeners. Thickened liquids are usually a "pudding-like" consistency, or they may be as thick as honey or thick enough to eat with a spoon. Cooking To moisten foods, you may add liquids while you are blending, mashing, or grinding your foods to the right consistency. These liquids include gravies, sauces, vegetable or fruit juice, milk, half and half, or water. Strain extra liquid from foods before eating. Reheat foods slowly to prevent a tough crust from forming. Prepare foods in advance. Meal planning Eat a variety of foods to get all the nutrients you need. Some foods may be tolerated better than others. Work with your dietitian to identify which foods are safest for you to eat. Follow your meal plan as told by your dietitian. What foods can I eat? Grains Moist breads without nuts or seeds. Biscuits, muffins, pancakes, and waffles that are well-moistened with syrup, jelly, margarine, or butter. Cooked cereals. Moist bread stuffing. Moist rice. Well-moistened cold cereal with small chunks. Well-cooked pasta, noodles, rice, and bread dressing in small pieces and thick sauce. Soft dumplings or spaetzle in small pieces and butter or gravy. Fruits Canned or cooked fruits that are soft or moist and do not have skin or seeds. Fresh, soft bananas. Thickened fruit juices. Vegetables Soft, well-cooked vegetables in  small pieces. Soft-cooked, mashed potatoes. Thickened vegetable juice. Meats and other proteins Tender, moist meats or poultry in small pieces. Moist meatballs or meatloaf. Fish without bones. Eggs or egg substitutes in small pieces. Tofu. Tempeh and meat alternatives in small pieces. Well-cooked, tender beans, peas, baked beans, and other legumes. Dairy Thickened milk. Cream cheese. Yogurt. Cottage cheese. Sour cream. Small pieces of soft cheese. Fats and oils Butter. Oils. Margarine. Mayonnaise. Gravy. Spreads. Sweets and desserts Soft, smooth, moist desserts. Pudding. Custard. Moist cakes. Jam. Jelly. Honey. Preserves. Ask your health care provider whether you can have frozen desserts. Seasonings and other foods All seasonings and sweeteners. All sauces with small chunks. Prepared tuna, egg, or chicken salad without raw fruits or vegetables. Moist casseroles with small, tender pieces of meat. Soups with tender meat. The items listed above may not be a complete list of foods and beverages you can eat. Contact a dietitian for more information. What foods must I avoid? Grains Coarse or dry cereals. Dry breads. Toast. Crackers. Tough, crusty breads, such as [place] bread and baguettes. Dry pancakes, waffles, and muffins. Sticky rice. Dry  bread stuffing. Granola. Popcorn. Chips. Fruits Hard, crunchy, stringy, high-pulp, and juicy raw fruits such as apples, pineapple, papaya, and watermelon. Small, round fruits, such as grapes. Dried fruit and fruit leather. Vegetables All raw vegetables. Cooked corn. Rubbery or stiff cooked vegetables. Stringy vegetables, such as celery. Tough, crisp fried potatoes. Potato skins. Meats and other proteins Large pieces of meat. Dry, tough meats, such as bacon, sausage, and hot dogs. Chicken, [place], or fish with skin and bones. Crunchy peanut butter. Nuts. Seeds. Nut and seed butters. Dairy Yogurt with nuts, seeds, or large chunks. Large chunks of cheese. Sweets  and desserts Dry cakes. Chewy or dry cookies. Any desserts with nuts, seeds, dry fruits, coconut, pineapple, or anything dry, sticky, or hard. Chewy caramel. Licorice. Taffy-type candies. Ask your health care provider whether you can have frozen desserts. Seasonings and other foods Soups with tough or large chunks of meats, poultry, or vegetables. Corn or clam chowder. Smoothies with large chunks of fruit. The items listed above may not be a complete list of foods and beverages you should avoid. Contact a dietitian for more information. Summary Bite size foods can be helpful for people with moderate swallowing problems. On this dysphagia eating plan, you may eat foods that are soft, moist, and cut into pieces smaller than your thumb nail (about 15 mm by 15 mm). You may be instructed to thicken liquids. Follow your health care provider's instructions about how to do this and to what consistency. This information is not intended to replace advice given to you by your health care provider. Make sure you discuss any questions you have with your health care provider. Document Revised: 04/09/2020 Document Reviewed: 04/09/2020 Elsevier Patient Education  2022 [place] is a copy of your full care plan:  Care Plan : RN Care Manager Plan of Care  Updates made by Knox Royalty, RN since 07/26/2021 12:00 AM     Problem: Chronic Disease Management Needs   Priority: High     Long-Range Goal: Development of Plan of Care for long term chronic disease management   Start Date: 07/26/2021  Expected End Date: 07/26/2022  Priority: High  Note:   Current Barriers:  Chronic Disease Management support and education needs related to Atrial Fibrillation and CHF Mild cognitive deficits/ mild dementia Requires assistance with ADL's/ iADL's: patient's daughter [name] lives with her; family has hired private duty caregivers Monday- Friday through [employer]" agency HOH: wears bilateral  hearing aids Intermittent decreased appetite/ swallowing difficulty: caregivers prepare food to ensure small bites, encourage proper chewing of food  RNCM Clinical Goal(s):  Patient will demonstrate ongoing health management independence as evidenced by adherence to plan of care for CHF; A-Fib        through collaboration with RN Care manager, provider, and care team.   Interventions: 1:1 collaboration with primary care provider regarding development and update of comprehensive plan of care as evidenced by provider attestation and co-signature Inter-disciplinary care team collaboration (see longitudinal plan of care) Evaluation of current treatment plan related to  self management and patient's adherence to plan as established by provider Initial assessment completed 07/26/21 Pain assessment completed: caregiver denies that patient is in acute/ chronic pain Falls assessment completed: caregiver reports no falls x 12 months; previous history of falls; now using rollator walker regularly Confirmed through review of EHR that patient is up to date with vaccines for flu, pneumonia, COVID  Discussed patient's intermittent swallowing issues/ decreased appetite: currently not using nutritional supplements-  patient does not like; encouraged ongoing management of preparing food in small bite sizes, considering soft foods, chin-chest swallowing technique for episodes of swallowing difficulty; denies issues with swallowing medications/ liquids; patient currently taking Marinol for appetite- caregiver reports "helps"  A-fib:  (Status: New goal.) Long Term Goal  Counseled on increased risk of stroke due to Afib and benefits of anticoagulation for stroke prevention           Reviewed importance of adherence to anticoagulant exactly as prescribed Counseled on importance of regular laboratory monitoring as prescribed Assessed social determinant of health barriers Discussed recent coumadin clinic visits with  caregiver: caregiver verbalizes good understanding of purpose of ACT therapy/ dosing criteria for coumadin  Heart Failure Interventions:  (Status: New goal.)  Long Term Goal  Reviewed role of diuretics in prevention of fluid overload and management of heart failure Discussed the importance of keeping all appointments with provider Discussed medication management with son/ caregiver: son fills pill box monthly; patient's daughter supervises all medication administration; caregiver denies medication concerns, questions, reports good general adherence to prescribed medications Reviewed recent cardiology provider office visit 06/14/21: caregiver confirms no medication changes, verbalizes good understanding of general plan of care for A-Fib/ CHF Confirmed patient currently following essentially regular diet: discussed benefit of limiting salt in diet in setting of CHF/ A-Fib  Patient Goals/Self-Care Activities: As evidenced by review of EHR, collaboration with care team, and patient reporting during CCM RN CM outreach, Patient [place] will: Take medications as prescribed Attend all scheduled provider appointments Call pharmacy for medication refills Call provider office for new concerns or questions Take efforts to to follow heart healthy, low salt, low cholesterol diet Continue efforts to prevent falls- use your walker regularly      Consent to CCM Services: Ms. Obando was given information about Chronic Care Management 07/15/21 services including:  CCM service includes personalized support from designated clinical staff supervised by her physician, including individualized plan of care and coordination with other care providers 24/7 contact phone numbers for assistance for urgent and routine care needs. Service will only be billed when office clinical staff spend 20 minutes or more in a month to coordinate care. Only one practitioner may furnish and bill the service in a calendar  month. The patient may stop CCM services at any time (effective at the end of the month) by phone call to the office staff. The patient will be responsible for cost sharing (co-pay) of up to 20% of the service fee (after annual deductible is met).  Patient agreed to services and verbal consent obtained.   Patient verbalizes understanding of instructions provided today and agrees to view in MyChart  Telephone follow up appointment with care management team member scheduled for:  Friday, August 30, 2021 at 3:00 pm The patient has been provided with contact information for the care management team and has been advised to call with any health related questions or concerns

## 2021-07-26 NOTE — Chronic Care Management (AMB) (Signed)
Chronic Care Management   CCM RN Visit Note  07/26/2021 Name: Holly Simmons MRN: [id number] DOB: [birth year]  Subjective: Holly Simmons is a 80 y.o. year old female who is a primary care patient of Janith Lima, MD. The care management team was consulted for assistance with disease management and care coordination needs.    Engaged with patient's son/ caregiver Simonne Come, on [facility] by telephone for initial visit in response to provider referral for case management and/or care coordination services.   Consent to Services:  The patient was given information about Chronic Care Management services, agreed to services, and gave verbal consent 07/15/21 prior to initiation of services.  Please see initial visit note for detailed documentation.  Patient agreed to services and verbal consent obtained.   Assessment: Review of patient past medical history, allergies, medications, health status, including review of consultants reports, laboratory and other test data, was performed as part of comprehensive evaluation and provision of chronic care management services.   SDOH (Social Determinants of Health) assessments and interventions performed:  SDOH Interventions    Flowsheet Row Most Recent Value  SDOH Interventions   Food Insecurity Interventions Intervention Not Indicated  Housing Interventions Intervention Not Indicated  [Single Family two story Home x 15 years,  lives on main level]  Transportation Interventions Intervention Not Indicated  [Family provides transportation]       CCM Care Plan Allergies  Allergen Reactions   Petrolatum-Zinc Oxide Anaphylaxis, Rash and Swelling   Sulfa Antibiotics Rash   Sulfonamide Derivatives Anaphylaxis and Swelling   Tetanus Toxoid     Other reaction(s): Other (See Comments) OTHER REACTION   Tetanus Toxoids Swelling   Other Rash and Other (See Comments)    STERI - STRIPS  It can affect proteins in her kidneys so she doesn't  take REACTION: proteinuria Other reaction(s): Other (See Comments), Unknown It can affect proteins in her kidneys so she doesn't take   Nsaids Other (See Comments)    REACTION: proteinuria Other reaction(s): Other (See Comments), Unknown It can affect proteins in her kidneys so she doesn't take    Tetanus Toxoid, Adsorbed Other (See Comments)    Other reaction(s): Other (See Comments) Turned arm red Turned arm red   Outpatient Encounter Medications as of 07/26/2021  Medication Sig   allopurinol (ZYLOPRIM) 100 MG tablet TAKE 1 TABLET BY MOUTH DAILY.   amoxicillin (AMOXIL) 500 MG capsule TAKE 4 CAPSULES 1 HOUR PRIOR TO PROCEDURE   Betamethasone Valerate 0.12 % foam APPLY TO SCALP AS DIRECTED.   buPROPion (WELLBUTRIN XL) 300 MG 24 hr tablet TAKE 1 TABLET ONCE DAILY.   ciclopirox (LOPROX) 0.77 % SUSP APPLY 1 PUMP TWICE A DAY.   dronabinol (MARINOL) 5 MG capsule TAKE ONE CAPSULE ONCE DAILY BEFORE LUNCH AND TAKE ONE CAPSULE BEFORE SUPPER   famotidine (PEPCID) 40 MG tablet TAKE ONE TABLET ONCE DAILY   ferrous sulfate 325 (65 FE) MG tablet Take 1 tablet (325 mg total) by mouth 2 (two) times daily with a meal. (Patient taking differently: Take 325 mg by mouth 3 (three) times daily with meals.)   fexofenadine (ALLEGRA) 180 MG tablet Take 180 mg by mouth daily as needed for allergies. (Patient not taking: Reported on 06/14/2021)   ipratropium (ATROVENT) 0.03 % nasal spray USE 2 SPRAYS IN EACH NOSTRIL 3 TIMES A DAY AS NEEDED.   ketoconazole (NIZORAL) 2 % cream APPLY TO AFFECTED AREA TWICE A DAY.   lamoTRIgine (LAMICTAL) 25 MG tablet TAKE 1 TABLET  ONCE DAILY.   metoprolol tartrate (LOPRESSOR) 25 MG tablet TAKE 1 TABLET BY MOUTH TWICE DAILY.   modafinil (PROVIGIL) 100 MG tablet Take 1 tablet (100 mg total) by mouth daily. (Patient not taking: Reported on 06/14/2021)   PRESCRIPTION MEDICATION CPAP   SYMBICORT 80-4.5 MCG/ACT inhaler USE 2 PUFFS TWICE A DAY.   torsemide (DEMADEX) 20 MG tablet TAKE 1  TABLET ONCE DAILY.   Vilazodone HCl (VIIBRYD) 40 MG TABS TAKE ONE TABLET ONCE DAILY   warfarin (COUMADIN) 1 MG tablet Take 1 tablet (1 mg total) by mouth daily. TAKE 1 1/2 TABLETS BY MOUTH ON WEDNESDAYS OR AS DIRECTED BY ANTICOAGULATION CLINIC.   warfarin (COUMADIN) 2.5 MG tablet TAKE 1 TABLET DAILY EXCEPT 2 TABLETS ON MONDAY, WEDNESDAY, AND FRIDAYS   [DISCONTINUED] Alum & Mag Hydroxide-Simeth (MAGIC MOUTHWASH) SOLN Take 5 mLs by mouth 3 (three) times daily. 14 days then stop    No facility-administered encounter medications on file as of 07/26/2021.   Patient Active Problem List   Diagnosis Date Noted   Thrombocytopenia ([place]) 12/04/2020   Easy bruising 11/22/2020   Encounter for general adult medical examination with abnormal findings 07/12/2020   Pharyngeal dysphagia 07/12/2020   Cachexia ([place]) 08/16/2019   Iron deficiency anemia due to chronic blood loss 09/23/2018   CKD (chronic kidney disease) stage 5, GFR less than 15 ml/min (HCC) 09/12/2018   Microalbuminuria due to type 2 diabetes mellitus ([place]) 04/21/2018   Obesity (BMI 30.0-34.9) 02/05/2016   Osteoporosis with pathological fracture of ankle and foot 05/30/2015   Encounter for therapeutic drug monitoring 11/22/2014   Atrial flutter ([place]) 05/08/2014   Complete heart block ([place]) 07/29/2013   Gout 08/20/2012   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 06/10/2012   Other screening mammogram 02/04/2012   Hyperlipidemia with target LDL less than 100 12/04/2011   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   Long term (current) use of anticoagulants 07/18/2011   Atrial fibrillation ([place]) 07/18/2011   Routine general medical examination at a health care facility 05/15/2011   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   Hypersomnia 08/16/2009   Depression, major, in partial remission ([place]) 11/28/2008   Chronic diastolic heart failure ([place]) 08/25/2008   COPD (chronic  obstructive pulmonary disease) with acute bronchitis ([place]) 08/10/2008   GERD 08/10/2008   Obstructive sleep apnea 08/10/2007   Allergic rhinitis 08/09/2007   Conditions to be addressed/monitored:  Atrial Fibrillation, CHF, and mild dementia  Care Plan : RN Care Manager Plan of Care  Updates made by Knox Royalty, RN since 07/26/2021 12:00 AM     Problem: Chronic Disease Management Needs   Priority: High     Long-Range Goal: Development of Plan of Care for long term chronic disease management   Start Date: 07/26/2021  Expected End Date: 07/26/2022  Priority: High  Note:   Current Barriers:  Chronic Disease Management support and education needs related to Atrial Fibrillation and CHF Mild cognitive deficits/ mild dementia Requires assistance with ADL's/ iADL's: patient's daughter [name] lives with her; family has hired private duty caregivers Monday- Friday through [employer]" agency HOH: wears bilateral hearing aids Intermittent decreased appetite/ swallowing difficulty: caregivers prepare food to ensure small bites, encourage proper chewing of food  RNCM Clinical Goal(s):  Patient will demonstrate ongoing health management independence as evidenced by adherence to plan of care for CHF; A-Fib        through collaboration with RN Care manager, provider, and care team.   Interventions: 1:1  collaboration with primary care provider regarding development and update of comprehensive plan of care as evidenced by provider attestation and co-signature Inter-disciplinary care team collaboration (see longitudinal plan of care) Evaluation of current treatment plan related to  self management and patient's adherence to plan as established by provider Initial assessment completed 07/26/21 Pain assessment completed: caregiver denies that patient is in acute/ chronic pain Falls assessment completed: caregiver reports no falls x 12 months; previous history of falls; now using rollator walker  regularly Confirmed through review of EHR that patient is up to date with vaccines for flu, pneumonia, COVID  Discussed patient's intermittent swallowing issues/ decreased appetite: currently not using nutritional supplements- patient does not like; encouraged ongoing management of preparing food in small bite sizes, considering soft foods, chin-chest swallowing technique for episodes of swallowing difficulty; denies issues with swallowing medications/ liquids; patient currently taking Marinol for appetite- caregiver reports "helps"  A-fib:  (Status: New goal.) Long Term Goal  Counseled on increased risk of stroke due to Afib and benefits of anticoagulation for stroke prevention           Reviewed importance of adherence to anticoagulant exactly as prescribed Counseled on importance of regular laboratory monitoring as prescribed Assessed social determinant of health barriers Discussed recent coumadin clinic visits with caregiver: caregiver verbalizes good understanding of purpose of ACT therapy/ dosing criteria for coumadin  Heart Failure Interventions:  (Status: New goal.)  Long Term Goal  Reviewed role of diuretics in prevention of fluid overload and management of heart failure Discussed the importance of keeping all appointments with provider Discussed medication management with son/ caregiver: son fills pill box monthly; patient's daughter supervises all medication administration; caregiver denies medication concerns, questions, reports good general adherence to prescribed medications Reviewed recent cardiology provider office visit 06/14/21: caregiver confirms no medication changes, verbalizes good understanding of general plan of care for A-Fib/ CHF Confirmed patient currently following essentially regular diet: discussed benefit of limiting salt in diet in setting of CHF/ A-Fib  Patient Goals/Self-Care Activities: As evidenced by review of EHR, collaboration with care team, and patient  reporting during CCM RN CM outreach, Patient Le[place] will: Take medications as prescribed Attend all scheduled provider appointments Call pharmacy for medication refills Call provider office for new concerns or questions Take efforts to to follow heart healthy, low salt, low cholesterol diet Continue efforts to prevent falls- use your walker regularly      Plan: Telephone follow up appointment with care management team member scheduled for:  Friday, August 30, 2021 at 3:00 pm The patient has been provided with contact information for the care management team and has been advised to call with any health related questions or concerns  Oneta Rack, RN, BSN, [place] [phone]: direct office

## 2021-08-03 DIAGNOSIS — I480 Paroxysmal atrial fibrillation: Secondary | ICD-10-CM | POA: Diagnosis not present

## 2021-08-03 DIAGNOSIS — I5032 Chronic diastolic (congestive) heart failure: Secondary | ICD-10-CM | POA: Diagnosis not present

## 2021-08-14 ENCOUNTER — Other Ambulatory Visit: Payer: Self-pay | Admitting: Internal Medicine

## 2021-08-14 DIAGNOSIS — F418 Other specified anxiety disorders: Secondary | ICD-10-CM

## 2021-08-14 DIAGNOSIS — F3341 Major depressive disorder, recurrent, in partial remission: Secondary | ICD-10-CM

## 2021-08-17 NOTE — Telephone Encounter (Signed)
Last Prolia inj 07/18/21 Next prolia inj due 01/17/22

## 2021-08-21 DIAGNOSIS — N1832 Chronic kidney disease, stage 3b: Secondary | ICD-10-CM | POA: Diagnosis not present

## 2021-08-21 DIAGNOSIS — N189 Chronic kidney disease, unspecified: Secondary | ICD-10-CM | POA: Diagnosis not present

## 2021-08-21 LAB — VITAMIN D 25 HYDROXY (VIT D DEFICIENCY, FRACTURES): Vit D, 25-Hydroxy: 36

## 2021-08-21 LAB — CBC AND DIFFERENTIAL
HCT: 38 (ref 36–46)
Hemoglobin: 12.5 (ref 12.0–16.0)
Platelets: 162 (ref 150–399)
WBC: 4.8

## 2021-08-21 LAB — BASIC METABOLIC PANEL
BUN: 27 — AB (ref 4–21)
CO2: 26 — AB (ref 13–22)
Chloride: 101 (ref 99–108)
Creatinine: 1.9 — AB (ref 0.5–1.1)
Glucose: 100
Potassium: 4.2 (ref 3.4–5.3)
Sodium: 141 (ref 137–147)

## 2021-08-21 LAB — IRON,TIBC AND FERRITIN PANEL
Ferritin: 230
Iron: 57
TIBC: 240
UIBC: 183

## 2021-08-21 LAB — COMPREHENSIVE METABOLIC PANEL: GFR calc non Af Amer: 27

## 2021-08-22 ENCOUNTER — Encounter: Payer: Self-pay | Admitting: Internal Medicine

## 2021-08-27 ENCOUNTER — Ambulatory Visit: Payer: Medicare PPO

## 2021-08-27 ENCOUNTER — Encounter: Payer: Self-pay | Admitting: Internal Medicine

## 2021-08-27 ENCOUNTER — Ambulatory Visit: Payer: Medicare PPO | Admitting: Internal Medicine

## 2021-08-27 ENCOUNTER — Other Ambulatory Visit: Payer: Self-pay

## 2021-08-27 VITALS — BP 126/62 | HR 94 | Temp 98.2°F | Ht 63.0 in | Wt 150.0 lb

## 2021-08-27 DIAGNOSIS — D500 Iron deficiency anemia secondary to blood loss (chronic): Secondary | ICD-10-CM

## 2021-08-27 DIAGNOSIS — N185 Chronic kidney disease, stage 5: Secondary | ICD-10-CM | POA: Diagnosis not present

## 2021-08-27 DIAGNOSIS — R1313 Dysphagia, pharyngeal phase: Secondary | ICD-10-CM | POA: Diagnosis not present

## 2021-08-27 DIAGNOSIS — Z7901 Long term (current) use of anticoagulants: Secondary | ICD-10-CM

## 2021-08-27 DIAGNOSIS — I480 Paroxysmal atrial fibrillation: Secondary | ICD-10-CM | POA: Diagnosis not present

## 2021-08-27 LAB — POCT INR: INR: 2.6 (ref 2.0–3.0)

## 2021-08-27 NOTE — Patient Instructions (Addendum)
Pre visit review using our clinic review tool, if applicable. No additional management support is needed unless otherwise documented below in the visit note.  Continue 1 tablet of the 2.5 mg tablet daily except take 1 1/2 tablet of the 1 mg tablet on Wednesdays.  Re-check in 6 weeks. 

## 2021-08-27 NOTE — Progress Notes (Signed)
Subjective:  Patient ID: Holly Simmons, female    DOB: 25-May-1941  Age: 81 y.o. MRN: [id number]  CC: Atrial Fibrillation  This visit occurred during the SARS-CoV-2 public health emergency.  Safety protocols were in place, including screening questions prior to the visit, additional usage of staff PPE, and extensive cleaning of exam room while observing appropriate contact time as indicated for disinfecting solutions.    HPI Holly Simmons presents for f/up -  She recently had labs done by nephrology and her renal function is stable.  Her H&H were normal.  She continues to have trouble swallowing with choking episodes.  She is not interested in do anything about this.  Outpatient Medications Prior to Visit  Medication Sig Dispense Refill   allopurinol (ZYLOPRIM) 100 MG tablet TAKE 1 TABLET BY MOUTH DAILY. 90 tablet 1   amoxicillin (AMOXIL) 500 MG capsule TAKE 4 CAPSULES 1 HOUR PRIOR TO PROCEDURE 30 capsule 0   Betamethasone Valerate 0.12 % foam APPLY TO SCALP AS DIRECTED. 100 g 2   buPROPion (WELLBUTRIN XL) 300 MG 24 hr tablet TAKE ONE TABLET BY MOUTH ONCE DAILY 90 tablet 0   ciclopirox (LOPROX) 0.77 % SUSP APPLY 1 PUMP TWICE A DAY. 60 mL 0   dronabinol (MARINOL) 5 MG capsule TAKE ONE CAPSULE ONCE DAILY BEFORE LUNCH AND TAKE ONE CAPSULE BEFORE SUPPER 180 capsule 1   famotidine (PEPCID) 40 MG tablet TAKE ONE TABLET ONCE DAILY 90 tablet 1   ferrous sulfate 325 (65 FE) MG tablet Take 1 tablet (325 mg total) by mouth 2 (two) times daily with a meal. (Patient taking differently: Take 325 mg by mouth 3 (three) times daily with meals.) 180 tablet 1   fexofenadine (ALLEGRA) 180 MG tablet Take 180 mg by mouth daily as needed for allergies.     ipratropium (ATROVENT) 0.03 % nasal spray USE 2 SPRAYS IN EACH NOSTRIL 3 TIMES A DAY AS NEEDED. 30 mL 0   ketoconazole (NIZORAL) 2 % cream APPLY TO AFFECTED AREA TWICE A DAY. 60 g 0   lamoTRIgine (LAMICTAL) 25 MG tablet TAKE ONE TABLET BY MOUTH  ONCE DAILY 90 tablet 0   metoprolol tartrate (LOPRESSOR) 25 MG tablet TAKE 1 TABLET BY MOUTH TWICE DAILY. 180 tablet 5   modafinil (PROVIGIL) 100 MG tablet Take 1 tablet (100 mg total) by mouth daily. 30 tablet 5   PRESCRIPTION MEDICATION CPAP     SYMBICORT 80-4.5 MCG/ACT inhaler USE 2 PUFFS TWICE A DAY. 30.6 g 5   torsemide (DEMADEX) 20 MG tablet TAKE 1 TABLET ONCE DAILY. 90 tablet 5   Vilazodone HCl (VIIBRYD) 40 MG TABS TAKE ONE TABLET ONCE DAILY 90 tablet 1   warfarin (COUMADIN) 1 MG tablet Take 1 tablet (1 mg total) by mouth daily. TAKE 1 1/2 TABLETS BY MOUTH ON WEDNESDAYS OR AS DIRECTED BY ANTICOAGULATION CLINIC. 30 tablet 0   warfarin (COUMADIN) 2.5 MG tablet TAKE 1 TABLET DAILY EXCEPT 2 TABLETS ON MONDAY, WEDNESDAY, AND FRIDAYS 40 tablet 1   No facility-administered medications prior to visit.    ROS Review of Systems  Constitutional:  Negative for diaphoresis and fatigue.  HENT:  Positive for trouble swallowing.   Respiratory:  Positive for choking. Negative for cough, shortness of breath and wheezing.   Cardiovascular:  Negative for chest pain, palpitations and leg swelling.  Gastrointestinal:  Negative for abdominal pain, constipation, diarrhea, nausea and vomiting.  Genitourinary:  Negative for difficulty urinating.  Musculoskeletal: Negative.  Negative for arthralgias and myalgias.  Skin: Negative.  Negative for color change.  Neurological:  Negative for dizziness, weakness, light-headedness and headaches.  Hematological:  Negative for adenopathy. Bruises/bleeds easily.   Objective:  BP 126/62 (BP Location: Right Arm, Patient Position: Sitting, Cuff Size: Large)    Pulse 94    Temp 98.2 F (36.8 C) (Oral)    Ht 5\' 3"  (1.6 m)    Wt 150 lb (68 kg)    SpO2 94%    BMI 26.57 kg/m   BP Readings from Last 3 Encounters:  08/27/21 126/62  06/20/21 130/62  06/14/21 130/62    Wt Readings from Last 3 Encounters:  08/27/21 150 lb (68 kg)  06/20/21 149 lb (67.6 kg)  06/14/21 149  lb (67.6 kg)    Physical Exam Vitals reviewed.  HENT:     Mouth/Throat:     Mouth: Mucous membranes are moist.  Eyes:     General: No scleral icterus.    Conjunctiva/sclera: Conjunctivae normal.  Cardiovascular:     Rate and Rhythm: Normal rate and regular rhythm.     Heart sounds: Murmur heard.  Systolic murmur is present with a grade of 2/6.  Diastolic murmur is present with a grade of 1/4.    No gallop.  Pulmonary:     Effort: Pulmonary effort is normal.     Breath sounds: No stridor. No wheezing, rhonchi or rales.  Abdominal:     General: Abdomen is flat.     Palpations: There is no mass.     Tenderness: There is no abdominal tenderness. There is no guarding.     Hernia: No hernia is present.  Musculoskeletal:     Cervical back: Neck supple.     Right lower leg: No edema.     Left lower leg: No edema.  Lymphadenopathy:     Cervical: No cervical adenopathy.  Skin:    General: Skin is warm and dry.     Findings: Bruising present.  Neurological:     General: No focal deficit present.     Mental Status: She is alert.    Lab Results  Component Value Date   WBC 4.8 08/21/2021   HGB 12.5 08/21/2021   HCT 38 08/21/2021   PLT 162 08/21/2021   GLUCOSE 79 11/09/2020   CHOL 128 07/12/2020   TRIG 53.0 07/12/2020   HDL 56.30 07/12/2020   LDLCALC 61 07/12/2020   ALT 8 08/23/2020   AST 14 08/23/2020   NA 141 08/21/2021   K 4.2 08/21/2021   CL 101 08/21/2021   CREATININE 1.9 (A) 08/21/2021   BUN 27 (A) 08/21/2021   CO2 26 (A) 08/21/2021   TSH 2.53 07/12/2020   INR 2.6 08/27/2021   HGBA1C 5.3 03/07/2020   MICROALBUR 63.9 (H) 04/20/2018    MM 3D SCREEN BREAST BILATERAL  Result Date: 05/25/2021 CLINICAL DATA:  Screening. EXAM: DIGITAL SCREENING BILATERAL MAMMOGRAM WITH TOMOSYNTHESIS AND CAD TECHNIQUE: Bilateral screening digital craniocaudal and mediolateral oblique mammograms were obtained. Bilateral screening digital breast tomosynthesis was performed. The images  were evaluated with computer-aided detection. COMPARISON:  Previous exam(s). ACR Breast Density Category b: There are scattered areas of fibroglandular density. FINDINGS: There are no findings suspicious for malignancy. IMPRESSION: No mammographic evidence of malignancy. A result letter of this screening mammogram will be mailed directly to the patient. RECOMMENDATION: Screening mammogram in one year. (Code:SM-B-01Y) BI-RADS CATEGORY  1: Negative. Electronically Signed   By: Dorise Bullion III M.D.   On: 05/25/2021 12:01    Assessment &  Plan:   Eve was seen today for atrial fibrillation.  Diagnoses and all orders for this visit:  Pharyngeal dysphagia- I offered a referral back to GI to consider feeding tube placement but she is not interested in this.  CKD (chronic kidney disease) stage 5, GFR less than 15 ml/min (HCC)- Her renal function is stable.  Iron deficiency anemia due to chronic blood loss- Her recent H&H were normal  PAF (paroxysmal atrial fibrillation) ([place])- She has good rate and rhythm control.   I am having Holly Simmons maintain her fexofenadine, PRESCRIPTION MEDICATION, modafinil, ipratropium, ketoconazole, ciclopirox, Symbicort, torsemide, metoprolol tartrate, ferrous sulfate, Betamethasone Valerate, amoxicillin, dronabinol, warfarin, Vilazodone HCl, allopurinol, famotidine, warfarin, lamoTRIgine, and buPROPion.  No orders of the defined types were placed in this encounter.    Follow-up: No follow-ups on file.  Scarlette Calico, MD

## 2021-08-27 NOTE — Progress Notes (Addendum)
Continue 1 tablet of the 2.5 mg tablet daily except take 1 1/2 tablet of the 1 mg tablet on Wednesdays.  Re-check in 6 weeks. 

## 2021-08-29 DIAGNOSIS — N184 Chronic kidney disease, stage 4 (severe): Secondary | ICD-10-CM | POA: Diagnosis not present

## 2021-08-29 DIAGNOSIS — I4891 Unspecified atrial fibrillation: Secondary | ICD-10-CM | POA: Diagnosis not present

## 2021-08-29 DIAGNOSIS — N2581 Secondary hyperparathyroidism of renal origin: Secondary | ICD-10-CM | POA: Diagnosis not present

## 2021-08-29 DIAGNOSIS — D631 Anemia in chronic kidney disease: Secondary | ICD-10-CM | POA: Diagnosis not present

## 2021-08-29 DIAGNOSIS — I129 Hypertensive chronic kidney disease with stage 1 through stage 4 chronic kidney disease, or unspecified chronic kidney disease: Secondary | ICD-10-CM | POA: Diagnosis not present

## 2021-08-29 DIAGNOSIS — R809 Proteinuria, unspecified: Secondary | ICD-10-CM | POA: Diagnosis not present

## 2021-08-30 ENCOUNTER — Ambulatory Visit (INDEPENDENT_AMBULATORY_CARE_PROVIDER_SITE_OTHER): Payer: Medicare PPO | Admitting: *Deleted

## 2021-08-30 DIAGNOSIS — I480 Paroxysmal atrial fibrillation: Secondary | ICD-10-CM

## 2021-08-30 DIAGNOSIS — I5032 Chronic diastolic (congestive) heart failure: Secondary | ICD-10-CM

## 2021-08-30 NOTE — Patient Instructions (Signed)
Visit Information  [name], thank you for taking time to talk with me today about Holly Simmons's health care needs. Please don't hesitate to contact me if I can be of assistance to you before our next scheduled telephone appointment.  Below are the goals we discussed today:  Patient Self-Care Activities: Patient Holly Simmons will: Take medications as prescribed Attend all scheduled provider appointments Call pharmacy for medication refills Call provider office for new concerns or questions Take efforts to to follow heart healthy, low salt, low cholesterol diet Continue efforts to prevent falls- use your walker regularly Try to make sure that you are taking in enough fluid to prevent becoming dehydrated  Our next scheduled telephone follow up visit/ appointment with care management team member is scheduled on:   Thursday, November 21, 2021 at 3:00 pm- This is a PHONE [place] appointment  If you need to cancel or re-schedule our visit, please call [phone] and our care guide team will be happy to assist you.   I look forward to hearing about your progress.   Oneta Rack, RN, BSN, [facility] RN Care Coordination- [place] [phone]: direct office  If you are experiencing a Mental Health or [place] or need someone to talk to, please  call the Suicide and Crisis Lifeline: 988 call the [place] National Suicide Prevention Lifeline: (430) 570-3694 or TTY: 236 769 0931 TTY (438)161-4960) to talk to a trained counselor call 1-800-273-TALK (toll free, 24 hour hotline) go to [facility] [place] [phone]) call 911   Patient's caregiver/ son verbalizes understanding of instructions and care plan provided today and agrees to view in [place]. Active MyChart status confirmed with caregiver    Dysphagia Eating Plan, Bite Size Food This eating plan is for people with moderate swallowing problems who have  transitioned from pureed and minced foods. Bite size foods are soft and cut into small chunks so that they can be swallowed safely. On this eating plan, you may be instructed to drink liquids that are thickened. Work with your health care provider and your diet and nutrition specialist (dietitian) to make sure that you are following the eating plan safely and getting all the nutrients you need. What are tips for following this plan? General information  You may eat foods that are tender, soft, and moist. Always test food texture before taking a bite. Poke food with a fork or spoon to make sure it is tender. The test sample should squash, break apart, or change shape, and it should not return to its original shape when the fork or spoon is removed. Food should be easy to cut and chew. Avoid large pieces of food that require a lot of chewing. Take small bites. Each bite should be smaller than your thumb nail (about 15 mm by 15 mm). If you were on a pureed or minced food eating plan, you may eat any of the foods included in those diets. Avoid foods that are very dry, hard, sticky, chewy, coarse, or crunchy. If instructed by your health care provider, thicken liquids. Follow your health care provider's instructions for what products to use, how to do this, and to what thickness. Your health care provider may recommend using a commercial thickener, rice cereal, or potato flakes. Ask your health care provider to recommend thickeners. Thickened liquids are usually a "pudding-like" consistency, or they may be as thick as honey or thick enough to eat with a spoon. Cooking To moisten foods, you  may add liquids while you are blending, mashing, or grinding your foods to the right consistency. These liquids include gravies, sauces, vegetable or fruit juice, milk, half and half, or water. Strain extra liquid from foods before eating. Reheat foods slowly to prevent a tough crust from forming. Prepare foods in  advance. Meal planning Eat a variety of foods to get all the nutrients you need. Some foods may be tolerated better than others. Work with your dietitian to identify which foods are safest for you to eat. Follow your meal plan as told by your dietitian. What foods can I eat? Grains Moist breads without nuts or seeds. Biscuits, muffins, pancakes, and waffles that are well-moistened with syrup, jelly, margarine, or butter. Cooked cereals. Moist bread stuffing. Moist rice. Well-moistened cold cereal with small chunks. Well-cooked pasta, noodles, rice, and bread dressing in small pieces and thick sauce. Soft dumplings or spaetzle in small pieces and butter or gravy. Fruits Canned or cooked fruits that are soft or moist and do not have skin or seeds. Fresh, soft bananas. Thickened fruit juices. Vegetables Soft, well-cooked vegetables in small pieces. Soft-cooked, mashed potatoes. Thickened vegetable juice. Meats and other proteins Tender, moist meats or poultry in small pieces. Moist meatballs or meatloaf. Fish without bones. Eggs or egg substitutes in small pieces. Tofu. Tempeh and meat alternatives in small pieces. Well-cooked, tender beans, peas, baked beans, and other legumes. Dairy Thickened milk. Cream cheese. Yogurt. Cottage cheese. Sour cream. Small pieces of soft cheese. Fats and oils Butter. Oils. Margarine. Mayonnaise. Gravy. Spreads. Sweets and desserts Soft, smooth, moist desserts. Pudding. Custard. Moist cakes. Jam. Jelly. Honey. Preserves. Ask your health care provider whether you can have frozen desserts. Seasonings and other foods All seasonings and sweeteners. All sauces with small chunks. Prepared tuna, egg, or chicken salad without raw fruits or vegetables. Moist casseroles with small, tender pieces of meat. Soups with tender meat. The items listed above may not be a complete list of foods and beverages you can eat. Contact a dietitian for more information. What foods must I  avoid? Grains Coarse or dry cereals. Dry breads. Toast. Crackers. Tough, crusty breads, such as [place] bread and baguettes. Dry pancakes, waffles, and muffins. Sticky rice. Dry bread stuffing. Granola. Popcorn. Chips. Fruits Hard, crunchy, stringy, high-pulp, and juicy raw fruits such as apples, pineapple, papaya, and watermelon. Small, round fruits, such as grapes. Dried fruit and fruit leather. Vegetables All raw vegetables. Cooked corn. Rubbery or stiff cooked vegetables. Stringy vegetables, such as celery. Tough, crisp fried potatoes. Potato skins. Meats and other proteins Large pieces of meat. Dry, tough meats, such as bacon, sausage, and hot dogs. Chicken, [place], or fish with skin and bones. Crunchy peanut butter. Nuts. Seeds. Nut and seed butters. Dairy Yogurt with nuts, seeds, or large chunks. Large chunks of cheese. Sweets and desserts Dry cakes. Chewy or dry cookies. Any desserts with nuts, seeds, dry fruits, coconut, pineapple, or anything dry, sticky, or hard. Chewy caramel. Licorice. Taffy-type candies. Ask your health care provider whether you can have frozen desserts. Seasonings and other foods Soups with tough or large chunks of meats, poultry, or vegetables. Corn or clam chowder. Smoothies with large chunks of fruit. The items listed above may not be a complete list of foods and beverages you should avoid. Contact a dietitian for more information. Summary Bite size foods can be helpful for people with moderate swallowing problems. On this dysphagia eating plan, you may eat foods that are soft, moist, and cut into pieces  smaller than your thumb nail (about 15 mm by 15 mm). You may be instructed to thicken liquids. Follow your health care provider's instructions about how to do this and to what consistency. This information is not intended to replace advice given to you by your health care provider. Make sure you discuss any questions you have with your health care  provider. Document Revised: 04/09/2020 Document Reviewed: 04/09/2020 Elsevier Patient Education  2022 [place] A soft-food eating plan includes foods that are safe and easy to chew and swallow. Your health care provider or dietitian can help you find foods and flavors that fit into this plan. Follow this plan until your health care provider or dietitian says it is safe to start eating other foods and food textures. What are tips for following this plan? Cooking Cook meats so they stay tender and moist. Use methods like braising, stewing, or baking in liquid. Cook vegetables and fruit until they are soft enough to be mashed with a fork. Peel soft, fresh fruits such as peaches, nectarines, and melons. When making soup, make sure chunks of meat and vegetables are smaller than  inch. Reheat leftover foods slowly so that a tough crust does not form. General information  Take small bites of food or cut food into pieces about  inch or smaller. Bite-sized pieces of food are easier to chew and swallow. Eat moist foods. Avoid overly dry foods. Avoid foods that: Are difficult to swallow, such as dry, chunky, crispy, or sticky foods. Are difficult to chew, such as hard, tough, or stringy foods. Contain nuts, seeds, or many types of uncooked fruit. Follow instructions from your dietitian about the types of liquids that are safe for you to swallow. You may be allowed to have: Thick liquids only. This includes only liquids that are thicker than honey. Thin and thick liquids. This includes all beverages and foods that become liquid at room temperature. To make thick liquids: Purchase a commercial liquid thickening powder. These are available at grocery stores and pharmacies. Mix the thickener into liquids according to instructions on the label. Purchase ready-made thickened liquids. Thicken soup by pureeing, straining to remove chunks, and adding flour, potato flakes, or  corn starch. Add commercial thickener to foods that become liquid at room temperature, such as milk shakes, yogurt, ice cream, gelatin, and sherbet. Ask your health care provider whether you need to take a fiber supplement. What foods should I eat? Fruits All canned and cooked fruits. Soft, peeled fresh fruits. Strawberries. Vegetables All soft-cooked vegetables. Shredded lettuce. Grains Breads, muffins, pancakes, or waffles moistened with syrup, jelly, or butter. Dry cereals well-moistened with milk. Moist, cooked cereals. Well-cooked pasta and rice. Meats and other proteins Tender, moist ground meat, poultry, or fish. Meat cooked in gravy or sauces. Eggs. Dairy Milk. Cream. Yogurt. Cottage cheese. Soft cheese without the rind. Sweets and desserts Ice cream. Milk shakes. Sherbet. Pudding. Fats and oils Butter. Margarine. Olive, canola, sunflower, and grapeseed oil. Smooth salad dressing. Smooth cream cheese. Mayonnaise. Gravy. The items listed above may not be a complete list of foods and beverages you can eat. Contact a dietitian for more information. What foods should I avoid? Fruits Fresh fruits with skins or seeds, or both, such as apples, pears, and grapes. Stringy, high-pulp fruits, such as papaya, pineapple, coconut, and mango. Fruit leather and all dried fruit. Vegetables All raw vegetables. Cooked corn. Cooked vegetables that are tough or stringy. Tough, crisp, fried potatoes and potato skins. Grains  Coarse or dry cereals, such as bran, granola, and shredded wheat. Tough or chewy crusty breads, such as [place] bread or baguettes. Breads with nuts, seeds, or fruit. Meats and other proteins Hard, dry sausages. Dry meat, poultry, or fish. Meats with gristle. Fish with bones. Fried meat or fish. Lunch meat and hotdogs. Nuts and seeds. Chunky peanut butter or other nut butters. Dairy Yogurt with nuts or coconut. Sweets and desserts Cakes or cookies that are very dry or chewy.  Desserts with dried fruit, nuts, or coconut. Fried pastries. Very rich pastries. Fats and oils Cream cheese with fruit or nuts. Salad dressings with seeds or chunks. The items listed above may not be a complete list of foods and beverages you should avoid. Contact a dietitian for more information. Summary A soft-food eating plan includes foods that are safe and easy to swallow. Generally, the foods should be soft enough to be mashed with a fork. Avoid foods that are dry, hard to chew, crunchy, sticky, stringy, or crispy. Ask your health care provider whether you need to thicken your liquids and if you need to take a fiber supplement. This information is not intended to replace advice given to you by your health care provider. Make sure you discuss any questions you have with your health care provider. Document Revised: 06/22/2020 Document Reviewed: 06/22/2020 Elsevier Patient Education  2022 [employer].

## 2021-08-30 NOTE — Chronic Care Management (AMB) (Signed)
Chronic Care Management   CCM RN Visit Note  08/30/2021 Name: Holly Simmons MRN: [id number] DOB: Jan 18, 1941  Subjective: Holly Simmons is a 81 y.o. year old female who is a primary care patient of Janith Lima, MD. The care management team was consulted for assistance with disease management and care coordination needs.    Engaged with patient's son/ caregiver/ HCPOA, on [place] DPR by telephone for follow up visit in response to provider referral for case management and/or care coordination services.   Consent to Services:  The patient was given information about Chronic Care Management services, agreed to services, and gave verbal consent prior to initiation of services.  Please see initial visit note for detailed documentation.  Patient agreed to services and verbal consent obtained.   Assessment: Review of patient past medical history, allergies, medications, health status, including review of consultants reports, laboratory and other test data, was performed as part of comprehensive evaluation and provision of chronic care management services.  CCM Care Plan  Allergies  Allergen Reactions   Petrolatum-Zinc Oxide Anaphylaxis, Rash and Swelling   Sulfa Antibiotics Rash   Sulfonamide Derivatives Anaphylaxis and Swelling   Tetanus Toxoid     Other reaction(s): Other (See Comments) OTHER REACTION   Tetanus Toxoids Swelling   Other Rash and Other (See Comments)    STERI - STRIPS  It can affect proteins in her kidneys so she doesn't take REACTION: proteinuria Other reaction(s): Other (See Comments), Unknown It can affect proteins in her kidneys so she doesn't take   Nsaids Other (See Comments)    REACTION: proteinuria Other reaction(s): Other (See Comments), Unknown It can affect proteins in her kidneys so she doesn't take    Tetanus Toxoid, Adsorbed Other (See Comments)    Other reaction(s): Other (See Comments) Turned arm red Turned arm red   Outpatient  Encounter Medications as of 08/30/2021  Medication Sig   allopurinol (ZYLOPRIM) 100 MG tablet TAKE 1 TABLET BY MOUTH DAILY.   amoxicillin (AMOXIL) 500 MG capsule TAKE 4 CAPSULES 1 HOUR PRIOR TO PROCEDURE   Betamethasone Valerate 0.12 % foam APPLY TO SCALP AS DIRECTED.   buPROPion (WELLBUTRIN XL) 300 MG 24 hr tablet TAKE ONE TABLET BY MOUTH ONCE DAILY   ciclopirox (LOPROX) 0.77 % SUSP APPLY 1 PUMP TWICE A DAY.   dronabinol (MARINOL) 5 MG capsule TAKE ONE CAPSULE ONCE DAILY BEFORE LUNCH AND TAKE ONE CAPSULE BEFORE SUPPER   famotidine (PEPCID) 40 MG tablet TAKE ONE TABLET ONCE DAILY   ferrous sulfate 325 (65 FE) MG tablet Take 1 tablet (325 mg total) by mouth 2 (two) times daily with a meal. (Patient taking differently: Take 325 mg by mouth 3 (three) times daily with meals.)   fexofenadine (ALLEGRA) 180 MG tablet Take 180 mg by mouth daily as needed for allergies.   ipratropium (ATROVENT) 0.03 % nasal spray USE 2 SPRAYS IN EACH NOSTRIL 3 TIMES A DAY AS NEEDED.   ketoconazole (NIZORAL) 2 % cream APPLY TO AFFECTED AREA TWICE A DAY.   lamoTRIgine (LAMICTAL) 25 MG tablet TAKE ONE TABLET BY MOUTH ONCE DAILY   metoprolol tartrate (LOPRESSOR) 25 MG tablet TAKE 1 TABLET BY MOUTH TWICE DAILY.   modafinil (PROVIGIL) 100 MG tablet Take 1 tablet (100 mg total) by mouth daily.   PRESCRIPTION MEDICATION CPAP   SYMBICORT 80-4.5 MCG/ACT inhaler USE 2 PUFFS TWICE A DAY.   torsemide (DEMADEX) 20 MG tablet TAKE 1 TABLET ONCE DAILY.   Vilazodone HCl (VIIBRYD) 40 MG TABS  TAKE ONE TABLET ONCE DAILY   warfarin (COUMADIN) 1 MG tablet Take 1 tablet (1 mg total) by mouth daily. TAKE 1 1/2 TABLETS BY MOUTH ON WEDNESDAYS OR AS DIRECTED BY ANTICOAGULATION CLINIC.   warfarin (COUMADIN) 2.5 MG tablet TAKE 1 TABLET DAILY EXCEPT 2 TABLETS ON MONDAY, WEDNESDAY, AND FRIDAYS   [DISCONTINUED] Alum & Mag Hydroxide-Simeth (MAGIC MOUTHWASH) SOLN Take 5 mLs by mouth 3 (three) times daily. 14 days then stop    No facility-administered  encounter medications on file as of 08/30/2021.   Patient Active Problem List   Diagnosis Date Noted   Thrombocytopenia ([place]) 12/04/2020   Easy bruising 11/22/2020   Encounter for general adult medical examination with abnormal findings 07/12/2020   Pharyngeal dysphagia 07/12/2020   Cachexia ([place]) 08/16/2019   Iron deficiency anemia due to chronic blood loss 09/23/2018   CKD (chronic kidney disease) stage 5, GFR less than 15 ml/min ([place]) 09/12/2018   Microalbuminuria due to type 2 diabetes mellitus ([place]) 04/21/2018   Obesity (BMI 30.0-34.9) 02/05/2016   Osteoporosis with pathological fracture of ankle and foot 05/30/2015   Encounter for therapeutic drug monitoring 11/22/2014   Atrial flutter ([place]) 05/08/2014   Complete heart block ([place]) 07/29/2013   Gout 08/20/2012   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 06/10/2012   Other screening mammogram 02/04/2012   Hyperlipidemia with target LDL less than 100 12/04/2011   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   Long term (current) use of anticoagulants 07/18/2011   Atrial fibrillation ([place]) 07/18/2011   Routine general medical examination at a health care facility 05/15/2011   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   Hypersomnia 08/16/2009   Depression, major, in partial remission ([place]) 11/28/2008   Chronic diastolic heart failure ([place]) 08/25/2008   COPD (chronic obstructive pulmonary disease) with acute bronchitis ([place]) 08/10/2008   GERD 08/10/2008   Obstructive sleep apnea 08/10/2007   Allergic rhinitis 08/09/2007   Conditions to be addressed/monitored:  Atrial Fibrillation and CHF  Care Plan : RN Care Manager Plan of Care  Updates made by Knox Royalty, RN since 08/30/2021 12:00 AM     Problem: Chronic Disease Management Needs   Priority: High     Long-Range Goal: Development of Plan of Care for long term chronic disease management   Start Date:  07/26/2021  Expected End Date: 07/26/2022  Priority: High  Note:   Current Barriers:  Chronic Disease Management support and education needs related to Atrial Fibrillation and CHF Mild cognitive deficits/ mild dementia Requires assistance with ADL's/ iADL's: patient's daughter [name] lives with her; family has hired private duty caregivers Monday- Friday through [employer]" agency HOH: wears bilateral hearing aids Intermittent decreased appetite/ swallowing difficulty: caregivers prepare food to ensure small bites, encourage proper chewing of food  RNCM Clinical Goal(s):  Patient will demonstrate ongoing health management independence as evidenced by adherence to plan of care for CHF; A-Fib        through collaboration with RN Care manager, provider, and care team.   Interventions: 1:1 collaboration with primary care provider regarding development and update of comprehensive plan of care as evidenced by provider attestation and co-signature Inter-disciplinary care team collaboration (see longitudinal plan of care) Evaluation of current treatment plan related to  self management and patient's adherence to plan as established by provider Initial assessment completed 07/26/21 07/26/21: Confirmed through review of EHR that patient is up to date with vaccines for flu, pneumonia, COVID   08/30/21: Pain assessment updated: caregiver  continues to deny that patient is in acute/ chronic pain Falls assessment updated: caregiver reports no falls x 12 months; no new/ recent falls since last outreach 07/26/21; previous history of falls; now using rollator walker regularly; fall risks/ prevention education reinforced; positive reinforcement provided with encouragement to continue efforts at keeping patient fall-free Discussed patient's ongoing/ intermittent swallowing issues/ decreased appetite: no changes in patient status; reinforced previously provided education around preparing food in small bite  sizes, considering soft foods, chin-chest swallowing technique for episodes of swallowing difficulty; denies issues with swallowing medications/ liquids; patient currently taking Marinol for appetite- caregiver reports "helps  Discussed and reviewed recent provider office visits: 08/27/21 PCP; 08/29/21- renal; caregiver reports "we got a great report at both; everything is stable" Confirmed no recent medication changes/ concerns; son continues to fill pill box monthly; patient's daughter supervises all medication administration; caregiver denies medication concerns, questions, reports ongoing good general adherence to prescribed medications  A-fib:  (Status: 08/30/21: Goal on Track (progressing): YES.) Long Term Goal  Reviewed importance of adherence to anticoagulant exactly as prescribed Counseled on importance of regular laboratory monitoring as prescribed Discussed recent coumadin clinic visits with caregiver: caregiver verbalizes ongoing good understanding of purpose of ACT therapy/ dosing criteria for coumadin Confirmed that caregiver has no current clinical concerns around patient status; does not believe patient has experienced recent episodes of AF at home   Heart Failure Interventions:  (Status: 08/30/21: Goal on Track (progressing): YES.)  Long Term Goal  Basic overview and discussion of pathophysiology of Heart Failure reviewed Reviewed role of diuretics in prevention of fluid overload and management of heart failure Discussed the importance of keeping all appointments with provider Confirmed patient continues taking diuretic as prescribed; in light of intermittent decreased appetite and ongoing intermittent swallowing issues, provided education around prevention of dehydration; caregiver confirms family is "doing their best" to ensure patient's hydration status if good; positive reinforcement provided with encouragement to continue efforts Confirmed patient unable to monitor/ record weights at  home due to being a fall risk; provided education/ we discussed other signs/ symptoms CHF yellow zone to monitor daily, caregiver denies clinical concerns today around swelling, shortness of breath Reviewed upcoming provider office visits: 02/26/22- PCP; "July 2023" renal provider; caregiver aware of all, verbalizes plans to attend as scheduled  Patient Goals/Self-Care Activities: As evidenced by review of EHR, collaboration with care team, and patient reporting during CCM RN CM outreach,  Patient Stela will: Take medications as prescribed Attend all scheduled provider appointments Call pharmacy for medication refills Call provider office for new concerns or questions Take efforts to to follow heart healthy, low salt, low cholesterol diet Continue efforts to prevent falls- use your walker regularly Try to make sure that you are taking in enough fluid to prevent becoming dehydrated    Plan: Telephone follow up appointment with care management team member scheduled for:  Thursday, November 21, 2021 at 3:00 pm The patient's caregiver has been provided with contact information for the care management team and has been advised to call with any health related questions or concerns   Oneta Rack, RN, BSN, [place] [phone]: direct office

## 2021-08-31 DIAGNOSIS — I4891 Unspecified atrial fibrillation: Secondary | ICD-10-CM | POA: Insufficient documentation

## 2021-08-31 DIAGNOSIS — I480 Paroxysmal atrial fibrillation: Secondary | ICD-10-CM | POA: Insufficient documentation

## 2021-09-03 DIAGNOSIS — I480 Paroxysmal atrial fibrillation: Secondary | ICD-10-CM

## 2021-09-03 DIAGNOSIS — I5032 Chronic diastolic (congestive) heart failure: Secondary | ICD-10-CM | POA: Diagnosis not present

## 2021-09-05 ENCOUNTER — Other Ambulatory Visit: Payer: Self-pay

## 2021-09-05 ENCOUNTER — Encounter: Payer: Self-pay | Admitting: Internal Medicine

## 2021-09-05 ENCOUNTER — Inpatient Hospital Stay (HOSPITAL_BASED_OUTPATIENT_CLINIC_OR_DEPARTMENT_OTHER)
Admission: EM | Admit: 2021-09-05 | Discharge: 2021-09-08 | DRG: 177 | Disposition: A | Discharge status: Home / Self Care | Payer: Medicare PPO | Attending: Family Medicine | Admitting: Family Medicine

## 2021-09-05 ENCOUNTER — Emergency Department: Payer: Medicare PPO

## 2021-09-05 ENCOUNTER — Emergency Department (HOSPITAL_BASED_OUTPATIENT_CLINIC_OR_DEPARTMENT_OTHER): Payer: Medicare PPO

## 2021-09-05 ENCOUNTER — Encounter (HOSPITAL_BASED_OUTPATIENT_CLINIC_OR_DEPARTMENT_OTHER): Payer: Self-pay | Admitting: *Deleted

## 2021-09-05 DIAGNOSIS — L409 Psoriasis, unspecified: Secondary | ICD-10-CM | POA: Diagnosis present

## 2021-09-05 DIAGNOSIS — Z7951 Long term (current) use of inhaled steroids: Secondary | ICD-10-CM | POA: Diagnosis not present

## 2021-09-05 DIAGNOSIS — I130 Hypertensive heart and chronic kidney disease with heart failure and stage 1 through stage 4 chronic kidney disease, or unspecified chronic kidney disease: Secondary | ICD-10-CM | POA: Diagnosis not present

## 2021-09-05 DIAGNOSIS — Z886 Allergy status to analgesic agent status: Secondary | ICD-10-CM

## 2021-09-05 DIAGNOSIS — J440 Chronic obstructive pulmonary disease with acute lower respiratory infection: Secondary | ICD-10-CM | POA: Diagnosis not present

## 2021-09-05 DIAGNOSIS — N1832 Chronic kidney disease, stage 3b: Secondary | ICD-10-CM | POA: Diagnosis present

## 2021-09-05 DIAGNOSIS — Z66 Do not resuscitate: Secondary | ICD-10-CM | POA: Diagnosis not present

## 2021-09-05 DIAGNOSIS — M109 Gout, unspecified: Secondary | ICD-10-CM | POA: Diagnosis present

## 2021-09-05 DIAGNOSIS — E1122 Type 2 diabetes mellitus with diabetic chronic kidney disease: Secondary | ICD-10-CM | POA: Diagnosis not present

## 2021-09-05 DIAGNOSIS — Z950 Presence of cardiac pacemaker: Secondary | ICD-10-CM

## 2021-09-05 DIAGNOSIS — I5032 Chronic diastolic (congestive) heart failure: Secondary | ICD-10-CM | POA: Diagnosis present

## 2021-09-05 DIAGNOSIS — K219 Gastro-esophageal reflux disease without esophagitis: Secondary | ICD-10-CM | POA: Diagnosis present

## 2021-09-05 DIAGNOSIS — F324 Major depressive disorder, single episode, in partial remission: Secondary | ICD-10-CM | POA: Diagnosis present

## 2021-09-05 DIAGNOSIS — R509 Fever, unspecified: Secondary | ICD-10-CM

## 2021-09-05 DIAGNOSIS — R059 Cough, unspecified: Secondary | ICD-10-CM | POA: Diagnosis not present

## 2021-09-05 DIAGNOSIS — G4733 Obstructive sleep apnea (adult) (pediatric): Secondary | ICD-10-CM | POA: Diagnosis present

## 2021-09-05 DIAGNOSIS — U071 COVID-19: Secondary | ICD-10-CM | POA: Diagnosis not present

## 2021-09-05 DIAGNOSIS — I422 Other hypertrophic cardiomyopathy: Secondary | ICD-10-CM | POA: Diagnosis present

## 2021-09-05 DIAGNOSIS — Z882 Allergy status to sulfonamides status: Secondary | ICD-10-CM | POA: Diagnosis not present

## 2021-09-05 DIAGNOSIS — J209 Acute bronchitis, unspecified: Secondary | ICD-10-CM | POA: Diagnosis present

## 2021-09-05 DIAGNOSIS — J9601 Acute respiratory failure with hypoxia: Secondary | ICD-10-CM

## 2021-09-05 DIAGNOSIS — Z8052 Family history of malignant neoplasm of bladder: Secondary | ICD-10-CM

## 2021-09-05 DIAGNOSIS — I252 Old myocardial infarction: Secondary | ICD-10-CM

## 2021-09-05 DIAGNOSIS — Z800 Family history of malignant neoplasm of digestive organs: Secondary | ICD-10-CM

## 2021-09-05 DIAGNOSIS — Z887 Allergy status to serum and vaccine status: Secondary | ICD-10-CM | POA: Diagnosis not present

## 2021-09-05 DIAGNOSIS — Z7901 Long term (current) use of anticoagulants: Secondary | ICD-10-CM

## 2021-09-05 DIAGNOSIS — Z79899 Other long term (current) drug therapy: Secondary | ICD-10-CM | POA: Diagnosis not present

## 2021-09-05 DIAGNOSIS — I4891 Unspecified atrial fibrillation: Secondary | ICD-10-CM | POA: Diagnosis present

## 2021-09-05 DIAGNOSIS — Z803 Family history of malignant neoplasm of breast: Secondary | ICD-10-CM

## 2021-09-05 DIAGNOSIS — Z888 Allergy status to other drugs, medicaments and biological substances status: Secondary | ICD-10-CM

## 2021-09-05 DIAGNOSIS — I442 Atrioventricular block, complete: Secondary | ICD-10-CM | POA: Diagnosis present

## 2021-09-05 DIAGNOSIS — I480 Paroxysmal atrial fibrillation: Secondary | ICD-10-CM | POA: Diagnosis present

## 2021-09-05 DIAGNOSIS — Z8249 Family history of ischemic heart disease and other diseases of the circulatory system: Secondary | ICD-10-CM

## 2021-09-05 DIAGNOSIS — J208 Acute bronchitis due to other specified organisms: Secondary | ICD-10-CM | POA: Diagnosis present

## 2021-09-05 DIAGNOSIS — E876 Hypokalemia: Secondary | ICD-10-CM | POA: Diagnosis not present

## 2021-09-05 DIAGNOSIS — Z8041 Family history of malignant neoplasm of ovary: Secondary | ICD-10-CM

## 2021-09-05 LAB — LACTIC ACID, PLASMA
Lactic Acid, Venous: 0.9 mmol/L (ref 0.5–1.9)
Lactic Acid, Venous: 1.8 mmol/L (ref 0.5–1.9)

## 2021-09-05 LAB — CBC WITH DIFFERENTIAL/PLATELET
Abs Immature Granulocytes: 0.07 10*3/uL (ref 0.00–0.07)
Basophils Absolute: 0 10*3/uL (ref 0.0–0.1)
Basophils Relative: 1 %
Eosinophils Absolute: 0.1 10*3/uL (ref 0.0–0.5)
Eosinophils Relative: 1 %
HCT: 35.7 % — ABNORMAL LOW (ref 36.0–46.0)
Hemoglobin: 11.7 g/dL — ABNORMAL LOW (ref 12.0–15.0)
Immature Granulocytes: 1 %
Lymphocytes Relative: 10 %
Lymphs Abs: 0.6 10*3/uL — ABNORMAL LOW (ref 0.7–4.0)
MCH: 32 pg (ref 26.0–34.0)
MCHC: 32.8 g/dL (ref 30.0–36.0)
MCV: 97.5 fL (ref 80.0–100.0)
Monocytes Absolute: 1.1 10*3/uL — ABNORMAL HIGH (ref 0.1–1.0)
Monocytes Relative: 17 %
Neutro Abs: 4.5 10*3/uL (ref 1.7–7.7)
Neutrophils Relative %: 70 %
Platelets: 104 10*3/uL — ABNORMAL LOW (ref 150–400)
RBC: 3.66 MIL/uL — ABNORMAL LOW (ref 3.87–5.11)
RDW: 14.3 % (ref 11.5–15.5)
Smear Review: DECREASED
WBC: 6.3 10*3/uL (ref 4.0–10.5)
nRBC: 0 % (ref 0.0–0.2)

## 2021-09-05 LAB — BASIC METABOLIC PANEL
Anion gap: 8 (ref 5–15)
BUN: 25 mg/dL — ABNORMAL HIGH (ref 8–23)
CO2: 28 mmol/L (ref 22–32)
Calcium: 8.7 mg/dL — ABNORMAL LOW (ref 8.9–10.3)
Chloride: 102 mmol/L (ref 98–111)
Creatinine, Ser: 1.55 mg/dL — ABNORMAL HIGH (ref 0.44–1.00)
GFR, Estimated: 34 mL/min — ABNORMAL LOW (ref >60)
Glucose, Bld: 111 mg/dL — ABNORMAL HIGH (ref 70–99)
Potassium: 3.6 mmol/L (ref 3.5–5.1)
Sodium: 138 mmol/L (ref 135–145)

## 2021-09-05 LAB — RESP PANEL BY RT-PCR (FLU A&B, COVID) ARPGX2
Influenza A by PCR: NEGATIVE
Influenza B by PCR: NEGATIVE
SARS Coronavirus 2 by RT PCR: POSITIVE — AB

## 2021-09-05 LAB — FERRITIN: Ferritin: 190 ng/mL (ref 11–307)

## 2021-09-05 LAB — PROCALCITONIN: Procalcitonin: 0.1 ng/mL

## 2021-09-05 LAB — TRIGLYCERIDES: Triglycerides: 51 mg/dL (ref <150)

## 2021-09-05 LAB — C-REACTIVE PROTEIN: CRP: 7.3 mg/dL — ABNORMAL HIGH (ref <1.0)

## 2021-09-05 LAB — D-DIMER, QUANTITATIVE: D-Dimer, Quant: 0.35 ug/mL-FEU (ref 0.00–0.50)

## 2021-09-05 LAB — PROTIME-INR
INR: 2.5 — ABNORMAL HIGH (ref 0.8–1.2)
Prothrombin Time: 26.8 seconds — ABNORMAL HIGH (ref 11.4–15.2)

## 2021-09-05 LAB — LACTATE DEHYDROGENASE: LDH: 129 U/L (ref 98–192)

## 2021-09-05 LAB — FIBRINOGEN: Fibrinogen: 550 mg/dL — ABNORMAL HIGH (ref 210–475)

## 2021-09-05 MED ORDER — METOPROLOL TARTRATE 25 MG PO TABS
25.0000 mg | ORAL_TABLET | Freq: Two times a day (BID) | ORAL | Status: DC
  Administered 2021-09-06 – 2021-09-08 (×5): 25 mg via ORAL
  Filled 2021-09-05 (×5): qty 1

## 2021-09-05 MED ORDER — DRONABINOL 5 MG PO CAPS
5.0000 mg | ORAL_CAPSULE | Freq: Two times a day (BID) | ORAL | Status: DC
  Administered 2021-09-06 – 2021-09-08 (×5): 5 mg via ORAL
  Filled 2021-09-05 (×5): qty 1

## 2021-09-05 MED ORDER — SODIUM CHLORIDE 0.9 % IV SOLN
100.0000 mg | Freq: Every day | INTRAVENOUS | Status: DC
  Administered 2021-09-06 – 2021-09-08 (×3): 100 mg via INTRAVENOUS
  Filled 2021-09-05 (×3): qty 20

## 2021-09-05 MED ORDER — LAMOTRIGINE 25 MG PO TABS
25.0000 mg | ORAL_TABLET | Freq: Every day | ORAL | Status: DC
  Administered 2021-09-06 – 2021-09-08 (×3): 25 mg via ORAL
  Filled 2021-09-05 (×3): qty 1

## 2021-09-05 MED ORDER — FERROUS SULFATE 325 (65 FE) MG PO TABS
325.0000 mg | ORAL_TABLET | Freq: Two times a day (BID) | ORAL | Status: DC
  Administered 2021-09-06 – 2021-09-08 (×5): 325 mg via ORAL
  Filled 2021-09-05 (×5): qty 1

## 2021-09-05 MED ORDER — METHYLPREDNISOLONE SODIUM SUCC 125 MG IJ SOLR
1.0000 mg/kg | Freq: Two times a day (BID) | INTRAMUSCULAR | Status: DC
  Administered 2021-09-05: 68.125 mg via INTRAVENOUS
  Filled 2021-09-05: qty 2

## 2021-09-05 MED ORDER — SODIUM CHLORIDE 0.9 % IV SOLN
100.0000 mg | Freq: Once | INTRAVENOUS | Status: AC
  Administered 2021-09-05: 100 mg via INTRAVENOUS

## 2021-09-05 MED ORDER — MOMETASONE FURO-FORMOTEROL FUM 100-5 MCG/ACT IN AERO
2.0000 | INHALATION_SPRAY | Freq: Two times a day (BID) | RESPIRATORY_TRACT | Status: DC
  Filled 2021-09-05: qty 8.8

## 2021-09-05 MED ORDER — ONDANSETRON HCL 4 MG PO TABS: 4.0000 mg | ORAL_TABLET | Freq: Four times a day (QID) | ORAL | Status: DC | PRN

## 2021-09-05 MED ORDER — VILAZODONE HCL 20 MG PO TABS
40.0000 mg | ORAL_TABLET | Freq: Every day | ORAL | Status: DC
  Administered 2021-09-06 – 2021-09-08 (×3): 40 mg via ORAL
  Filled 2021-09-05 (×3): qty 2

## 2021-09-05 MED ORDER — ONDANSETRON HCL 4 MG/2ML IJ SOLN: 4.0000 mg | Freq: Four times a day (QID) | INTRAMUSCULAR | Status: DC | PRN

## 2021-09-05 MED ORDER — SENNOSIDES-DOCUSATE SODIUM 8.6-50 MG PO TABS: 1.0000 | ORAL_TABLET | Freq: Every evening | ORAL | Status: DC | PRN

## 2021-09-05 MED ORDER — ACETAMINOPHEN 325 MG PO TABS: 650.0000 mg | ORAL_TABLET | Freq: Four times a day (QID) | ORAL | Status: DC | PRN

## 2021-09-05 MED ORDER — TORSEMIDE 20 MG PO TABS
20.0000 mg | ORAL_TABLET | Freq: Every day | ORAL | Status: DC
  Administered 2021-09-06: 20 mg via ORAL
  Filled 2021-09-05: qty 1

## 2021-09-05 MED ORDER — FAMOTIDINE 20 MG PO TABS
40.0000 mg | ORAL_TABLET | Freq: Every day | ORAL | Status: DC
  Administered 2021-09-06: 40 mg via ORAL
  Filled 2021-09-05: qty 2

## 2021-09-05 MED ORDER — ALLOPURINOL 100 MG PO TABS
100.0000 mg | ORAL_TABLET | Freq: Every day | ORAL | Status: DC
  Administered 2021-09-06 – 2021-09-08 (×3): 100 mg via ORAL
  Filled 2021-09-05 (×3): qty 1

## 2021-09-05 MED ORDER — VILAZODONE HCL 40 MG PO TABS: 40.0000 mg | ORAL_TABLET | Freq: Every day | ORAL | Status: DC

## 2021-09-05 MED ORDER — BUPROPION HCL ER (XL) 300 MG PO TB24
300.0000 mg | ORAL_TABLET | Freq: Every day | ORAL | Status: DC
  Administered 2021-09-06 – 2021-09-08 (×3): 300 mg via ORAL
  Filled 2021-09-05 (×3): qty 1

## 2021-09-05 NOTE — ED Notes (Signed)
[facility] has arrived to transport the patient to [facility]

## 2021-09-05 NOTE — ED Provider Notes (Signed)
[facility]  Provider Note  CSN: [id number] Arrival date & time: 09/05/21 1150  History Chief Complaint  Patient presents with   URI    Holly Simmons is a 81 y.o. female with history of multiple medical problems including HTN, COPD (not on home oxygen), DM, dysphagia and pacemaker reports 2 days of dry cough, sore throat, low grade fever and reported low SpO2 to 90-92% at home. She has had covid and flu vaccines, was at a funeral last week but no known sick contacts.    Home Medications Prior to Admission medications   Medication Sig Start Date End Date Taking? Authorizing Provider  allopurinol (ZYLOPRIM) 100 MG tablet TAKE 1 TABLET BY MOUTH DAILY. Patient taking differently: Take 100 mg by mouth daily. 06/11/21  Yes Janith Lima, MD  amoxicillin (AMOXIL) 500 MG capsule TAKE 4 CAPSULES 1 HOUR PRIOR TO PROCEDURE Patient taking differently: Take 2,000 mg by mouth See admin instructions. Take 4 capsules 1 hour prior to dental appointment 01/10/21  Yes Josue Hector, MD  Betamethasone Valerate 0.12 % foam APPLY TO SCALP AS DIRECTED. Patient taking differently: 1 application once a week. 12/11/20  Yes Janith Lima, MD  buPROPion (WELLBUTRIN XL) 300 MG 24 hr tablet TAKE ONE TABLET BY MOUTH ONCE DAILY Patient taking differently: 300 mg daily. 08/14/21  Yes Janith Lima, MD  dronabinol (MARINOL) 5 MG capsule TAKE ONE CAPSULE ONCE DAILY BEFORE LUNCH AND TAKE ONE CAPSULE BEFORE SUPPER Patient taking differently: 5 mg 2 (two) times daily before lunch and supper. 06/11/21  Yes Janith Lima, MD  famotidine (PEPCID) 40 MG tablet TAKE ONE TABLET ONCE DAILY Patient taking differently: Take 40 mg by mouth daily. 06/11/21  Yes Janith Lima, MD  ferrous sulfate 325 (65 FE) MG tablet Take 1 tablet (325 mg total) by mouth 2 (two) times daily with a meal. Patient taking differently: Take 325-650 mg by mouth 2 (two) times daily with a meal. 11/10/20  Yes Janith Lima, MD  fexofenadine (ALLEGRA) 180 MG tablet Take 180 mg by mouth daily as needed for allergies.   Yes [provider]  ipratropium (ATROVENT) 0.03 % nasal spray USE 2 SPRAYS IN EACH NOSTRIL 3 TIMES A DAY AS NEEDED. Patient taking differently: Place 2 sprays into both nostrils daily as needed (nose bleed). 04/29/18  Yes [name], MD  lamoTRIgine (LAMICTAL) 25 MG tablet TAKE ONE TABLET BY MOUTH ONCE DAILY Patient taking differently: Take 25 mg by mouth daily. 08/14/21  Yes Janith Lima, MD  metoprolol tartrate (LOPRESSOR) 25 MG tablet TAKE 1 TABLET BY MOUTH TWICE DAILY. Patient taking differently: Take 25 mg by mouth 2 (two) times daily. 10/11/20  Yes Janith Lima, MD  modafinil (PROVIGIL) 100 MG tablet Take 1 tablet (100 mg total) by mouth daily. Patient taking differently: Take 100 mg by mouth daily as needed (sleep disorder). 03/29/18  Yes [name], MD  PRESCRIPTION MEDICATION 1 Dose by Other route at bedtime as needed (sleep). CPAP   Yes [provider]  Propylene Glycol (SYSTANE BALANCE OP) Place 1 drop into both eyes 4 (four) times daily as needed (dry eyes).   Yes [provider]  SYMBICORT 80-4.5 MCG/ACT inhaler USE 2 PUFFS TWICE A DAY. Patient taking differently: 2 puffs 2 (two) times daily. 10/11/20  Yes Janith Lima, MD  torsemide (DEMADEX) 20 MG tablet TAKE 1 TABLET ONCE DAILY. Patient taking differently: Take 20 mg by mouth daily.  10/11/20  Yes Janith Lima, MD  Vilazodone HCl (VIIBRYD) 40 MG TABS TAKE ONE TABLET ONCE DAILY Patient taking differently: 40 mg daily. 06/11/21  Yes Janith Lima, MD  warfarin (COUMADIN) 2.5 MG tablet TAKE 1 TABLET DAILY EXCEPT 2 TABLETS ON MONDAY, WEDNESDAY, AND FRIDAYS Patient taking differently: Take 1.25-2.5 mg by mouth See admin instructions. Takes 2.5 mg everyday except Wednesday. Takes 1.25 mg on Wednesday. 06/11/21  Yes Janith Lima, MD  ciclopirox (LOPROX) 0.77 % SUSP APPLY 1 PUMP TWICE A  DAY. Patient not taking: Reported on 09/05/2021 11/16/19   Janith Lima, MD  ketoconazole (NIZORAL) 2 % cream APPLY TO AFFECTED AREA TWICE A DAY. Patient not taking: Reported on 09/05/2021 08/17/19   Janith Lima, MD  warfarin (COUMADIN) 1 MG tablet Take 1 tablet (1 mg total) by mouth daily. TAKE 1 1/2 TABLETS BY MOUTH ON WEDNESDAYS OR AS DIRECTED BY ANTICOAGULATION CLINIC. Patient not taking: Reported on 09/05/2021 06/20/21   Janith Lima, MD  Alum & Mag Hydroxide-Simeth (MAGIC MOUTHWASH) SOLN Take 5 mLs by mouth 3 (three) times daily. 14 days then stop   05/19/19  [provider]     Allergies    Petrolatum-zinc oxide; Sulfa antibiotics; Sulfonamide derivatives; Tetanus toxoid; Tetanus toxoids; Other; Nsaids; and Tetanus toxoid, adsorbed   Review of Systems   Review of Systems Please see HPI for pertinent positives and negatives  Physical Exam BP 126/79 (BP Location: Left Arm)    Pulse 67    Temp 98 F (36.7 C)    Resp 16    Ht 5\' 3"  (1.6 m)    Wt 68 kg    SpO2 99%    BMI 26.56 kg/m   Physical Exam Vitals and nursing note reviewed.  Constitutional:      Appearance: Normal appearance.  HENT:     Head: Normocephalic and atraumatic.     Nose: Nose normal.     Mouth/Throat:     Mouth: Mucous membranes are moist.  Eyes:     Extraocular Movements: Extraocular movements intact.     Conjunctiva/sclera: Conjunctivae normal.  Cardiovascular:     Rate and Rhythm: Normal rate.  Pulmonary:     Effort: Pulmonary effort is normal.     Breath sounds: Examination of the right-lower field reveals decreased breath sounds. Examination of the left-lower field reveals decreased breath sounds. Decreased breath sounds present.  Abdominal:     General: Abdomen is flat.     Palpations: Abdomen is soft.     Tenderness: There is no abdominal tenderness.  Musculoskeletal:        General: No swelling. Normal range of motion.     Cervical back: Neck supple.  Skin:    General: Skin is  warm and dry.  Neurological:     General: No focal deficit present.     Mental Status: She is alert.  Psychiatric:        Mood and Affect: Mood normal.    ED Results / Procedures / Treatments   EKG EKG Interpretation  Date/Time:  Thursday September 05 2021 12:57:34 EST Ventricular Rate:  60 PR Interval:  73 QRS Duration: 211 QT Interval:  507 QTC Calculation: 507 R Axis:   148 Text Interpretation: Atrial-ventricular dual-paced rhythm No further analysis attempted due to paced rhythm No significant change since last tracing Confirmed by Calvert Cantor [phone]) on 09/05/2021 1:18:46 PM  Procedures Procedures  Medications Ordered in the ED Medications  remdesivir 100 mg in  sodium chloride 0.9 % 100 mL IVPB (100 mg Intravenous New Bag/Given 09/07/21 0858)  acetaminophen (TYLENOL) tablet 650 mg (has no administration in time range)  ondansetron (ZOFRAN) tablet 4 mg (has no administration in time range)    Or  ondansetron (ZOFRAN) injection 4 mg (has no administration in time range)  senna-docusate (Senokot-S) tablet 1 tablet (has no administration in time range)  allopurinol (ZYLOPRIM) tablet 100 mg (100 mg Oral Given 09/07/21 0859)  buPROPion (WELLBUTRIN XL) 24 hr tablet 300 mg (300 mg Oral Given 09/07/21 0859)  dronabinol (MARINOL) capsule 5 mg (5 mg Oral Given 09/07/21 1113)  ferrous sulfate tablet 325 mg (325 mg Oral Given 09/07/21 0859)  lamoTRIgine (LAMICTAL) tablet 25 mg (25 mg Oral Given 09/07/21 0900)  metoprolol tartrate (LOPRESSOR) tablet 25 mg (25 mg Oral Given 09/07/21 0859)  mometasone-formoterol (DULERA) 100-5 MCG/ACT inhaler 2 puff (2 puffs Inhalation Not Given 09/07/21 0847)  Vilazodone HCl TABS 40 mg (40 mg Oral Given 09/07/21 0859)  warfarin (COUMADIN) tablet 2.5 mg (2.5 mg Oral Given 09/06/21 1800)    And  warfarin (COUMADIN) tablet 1.25 mg (has no administration in time range)  famotidine (PEPCID) tablet 20 mg (20 mg Oral Given 09/07/21 0859)  remdesivir 100 mg in sodium chloride  0.9 % 100 mL IVPB (0 mg Intravenous Stopped 09/05/21 1553)  remdesivir 100 mg in sodium chloride 0.9 % 100 mL IVPB (0 mg Intravenous Stopped 09/05/21 1554)  potassium chloride SA (KLOR-CON M) CR tablet 40 mEq (40 mEq Oral Given 09/07/21 1113)    Initial Impression and Plan  Patient with URI symptoms and fever at home. Has dysphagia at baseline so will check labs and x-rays for signs of aspiration.   ED Course   Clinical Course as of 09/07/21 1605  Thu Sep 05, 2021  1317 Covid test is positive. Patient not initially hypoxic, but on re-eval she is 90% on room air. Does not appear dyspneic. Daughter at bedside reports she ws 88% after walking to the room but quickly recovered.  [CS]  9798 BMP with CKD about at baseline. CXR is clear.  [CS]  1400 CBC with mild anemia.  [CS]  1400 Will check an ambulatory room air SpO2 to determine need for admission.  [CS]  1447 Patient with SpO2 down to 87-88% on room air with ambulation. Will start supplemental oxygen, remdesivir and solumedrol. Hospitalist consulted for admission. Covid admission order set initiated as well.  [CS]  53 Spoke with Dr. Darrick Meigs, Hospitalist, who will accept for admission.  [CS]    Clinical Course User Index [CS] Truddie Hidden, MD     MDM Rules/Calculators/A&P Medical Decision Making Problems Addressed: Acute respiratory failure with hypoxia [facility]): acute illness or injury that poses a threat to life or bodily functions Cough with fever: acute illness or injury that poses a threat to life or bodily functions COVID-19: acute illness or injury that poses a threat to life or bodily functions  Amount and/or Complexity of Data Reviewed Labs: ordered. Decision-making details documented in ED Course. Radiology: ordered and independent interpretation performed. Decision-making details documented in ED Course.  Risk Prescription drug management. Decision regarding hospitalization.    Final Clinical Impression(s) / ED  Diagnoses Final diagnoses:  COVID-19  Acute respiratory failure with hypoxia [facility])    Rx / DC Orders ED Discharge Orders     None        Truddie Hidden, MD 09/07/21 [phone]

## 2021-09-05 NOTE — ED Notes (Signed)
Pt and pts son updated to current bed assignment status, all questions answered. Will continue to monitor.

## 2021-09-05 NOTE — H&P (Signed)
History and Physical    Holly Simmons TMA:[id number] DOB: February 06, 1941 DOA: 09/05/2021  PCP: Janith Lima, MD  Patient coming from: Home via [place] ED  I have personally briefly reviewed patient's old medical records in [place]  Chief Complaint: Cough, sore throat, fever  HPI: Holly Simmons is a 81 y.o. 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  Patient reports 2 days frequent nonproductive cough with fever at home as high as 101 F.  She has had poor appetite and fatigue.  She denies any significant dyspnea, chest pain, abdominal pain, nausea, vomiting, dysuria, diarrhea, or peripheral edema.  She does report recent exposure to COVID-19 when she attended a funeral a week ago.  [facility] ED Course   Labs/Imaging on admission: I have personally reviewed following labs and imaging studies.  Initial vitals showed BP 120/55, pulse 61, RR 20, temp 98.6 F, SPO2 96% on room air.  Labs show WBC 6.3, hemoglobin 11.7, platelets 104,000, sodium 138, potassium 3.6, bicarb 28, BUN 25, creatinine 1.55, serum glucose 111, lactic acid 0.9, INR 2.5, CRP 7.3, ferritin 190, LDH 129, procalcitonin 0.10, D-dimer 0.35.  SARS-CoV-2 PCR is positive.  Influenza A/B are negative.  Portable chest x-ray negative for focal consolidation, edema, effusion.  Unchanged 5 mm nodular opacity over the right midlung is noted.  Prior sternotomy changes and dual-lead cardiac pacemaker noted.  Patient was started on IV remdesivir and Solu-Medrol.  The hospitalist service was consulted to admit for further evaluation and management.  Review of Systems: All systems reviewed and are  negative except as documented in history of present illness above.   Past Medical History:  Diagnosis Date   Allergic rhinitis    Asthma    NOS w/ acute exacerbation   Blood transfusion without reported diagnosis    CKD (chronic kidney disease) stage 3, GFR 30-59 ml/min (HCC) 09/12/2018   Colon polyps    TUBULAR ADENOMAS AND HYPERPLASTIC   Complete heart block (HCC)    requiring PPM (MDT) post surgical myomectomy at [facility],  leads are epicardial with abdominal implant, high ventricular threshold at implant   COPD (chronic obstructive pulmonary disease) (HCC)    Depressive disorder    Diastolic dysfunction    DM (diabetes mellitus) ([place])    Gallstones    GERD (gastroesophageal reflux disease)    Gout 08/20/2012   Heart murmur    Hyperpotassemia    Hypersomnia    Hypertension    Hypertrophic cardiomyopathy ([place])    s/p surgical myomectomy at [facility] 89/37 complicated by septal VSD post procedure requiring reoperation with patch closure and tricuspid valve replacement   Kidney stones    Myocardial infarction ([place]) 2011   Obstructive sleep apnea    persistent daytime sleepiness despite cpap   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   Psoriasis    Pyuria    Renal insufficiency    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   Typical atrial flutter ([place]) 9/15    Past Surgical History:  Procedure Laterality Date   ABDOMINAL HYSTERECTOMY     APPENDECTOMY     BREAST CYST EXCISION     CARDIOVERSION N/A 08/01/2014   Procedure: CARDIOVERSION;  Surgeon: Thayer Headings, MD;  Location: [place];  Service: Cardiovascular;  Laterality: N/A;   Holly Simmons  [id number]   left eye lens implant   EYE SURGERY  [id number]   right eye lens implant   INSERT / REPLACE / REMOVE PACEMAKER  07/13/2012   PACEMAKER INSERTION  07/02/11   epicardial wires with abdominal implant at [facility] 12/12,  high ventricular lead threshold at implant per Dr Westley Gambles    PERMANENT PACEMAKER INSERTION N/A 07/13/2012   Procedure: PERMANENT PACEMAKER INSERTION;  Surgeon: Thompson Grayer, MD;  Location: [facility] C[facility] LAB;  Service: Cardiovascular;  Laterality: N/A;   POLYPECTOMY     septal myomectomy for hypertrophic CM  06/23/11   by Dr Evelina Dun at [facility], complicated by septal VSD requiring patch repair and tricuspid valve replacement   TONSILLECTOMY     TRICUSPID VALVE REPLACEMENT  11/12   Medtronic 36mm Mosaic tissue valve   VSD REPAIR  06/2011    Social History:  reports that she has never smoked. She has never used smokeless tobacco. She reports that she does not drink alcohol and does not use drugs.  Allergies  Allergen Reactions   Petrolatum-Zinc Oxide Anaphylaxis, Rash and Swelling   Sulfa Antibiotics Rash   Sulfonamide Derivatives Anaphylaxis and Swelling   Tetanus Toxoid     Other reaction(s): Other (See Comments) OTHER REACTION   Tetanus Toxoids Swelling   Other Rash and Other (See Comments)    STERI - STRIPS  It can affect proteins in her kidneys so she doesn't take REACTION: proteinuria Other reaction(s): Other (See Comments), Unknown It can affect proteins in her kidneys so she doesn't take   Nsaids Other (See Comments)    REACTION: proteinuria Other reaction(s): Other (See Comments), Unknown It can affect proteins in her kidneys so she doesn't take    Tetanus Toxoid, Adsorbed Other (See Comments)    Other reaction(s): Other (See Comments) Turned arm red Turned arm red    Family History  Problem Relation Age of Onset   Hypertension Daughter    Heart disease Maternal Grandfather    Heart disease Paternal Grandfather    Bladder Cancer Father    Prostate cancer Father    Cancer Father    Varicose Veins Father    Heart attack Father    Breast cancer Mother    Dementia Mother    Hypertension Mother    Cancer Mother    Ovarian cancer Maternal Aunt    Pancreatic cancer Cousin    Breast cancer Paternal Aunt    Dementia Maternal Aunt         x 2   Dementia Maternal Uncle        x 2   Colon cancer Neg Hx    Rectal cancer Neg Hx    Stomach cancer Neg Hx    Esophageal cancer Neg Hx      Prior to Admission medications   Medication Sig Start Date End Date Taking? Authorizing Provider  allopurinol (ZYLOPRIM) 100 MG tablet TAKE 1 TABLET BY MOUTH DAILY. Patient taking differently: Take 100 mg by mouth daily. 06/11/21  Yes Janith Lima, MD  amoxicillin (AMOXIL) 500 MG capsule TAKE 4 CAPSULES 1 HOUR PRIOR TO PROCEDURE Patient taking differently: Take 2,000 mg by mouth See admin instructions. Take 4 capsules 1 hour prior to dental appointment 01/10/21  Yes Josue Hector, MD  Betamethasone Valerate 0.12 % foam APPLY TO SCALP AS DIRECTED.  Patient taking differently: 1 application once a week. 12/11/20  Yes Janith Lima, MD  buPROPion (WELLBUTRIN XL) 300 MG 24 hr tablet TAKE ONE TABLET BY MOUTH ONCE DAILY Patient taking differently: 300 mg daily. 08/14/21  Yes Janith Lima, MD  dronabinol (MARINOL) 5 MG capsule TAKE ONE CAPSULE ONCE DAILY BEFORE LUNCH AND TAKE ONE CAPSULE BEFORE SUPPER Patient taking differently: 5 mg 2 (two) times daily before lunch and supper. 06/11/21  Yes Janith Lima, MD  famotidine (PEPCID) 40 MG tablet TAKE ONE TABLET ONCE DAILY Patient taking differently: Take 40 mg by mouth daily. 06/11/21  Yes Janith Lima, MD  ferrous sulfate 325 (65 FE) MG tablet Take 1 tablet (325 mg total) by mouth 2 (two) times daily with a meal. Patient taking differently: Take 325-650 mg by mouth 2 (two) times daily with a meal. 11/10/20  Yes Janith Lima, MD  fexofenadine (ALLEGRA) 180 MG tablet Take 180 mg by mouth daily as needed for allergies.   Yes [provider]  ipratropium (ATROVENT) 0.03 % nasal spray USE 2 SPRAYS IN EACH NOSTRIL 3 TIMES A DAY AS NEEDED. Patient taking differently: Place 2 sprays into both nostrils daily as needed (nose bleed). 04/29/18  Yes [name], MD  lamoTRIgine (LAMICTAL) 25 MG  tablet TAKE ONE TABLET BY MOUTH ONCE DAILY Patient taking differently: Take 25 mg by mouth daily. 08/14/21  Yes Janith Lima, MD  metoprolol tartrate (LOPRESSOR) 25 MG tablet TAKE 1 TABLET BY MOUTH TWICE DAILY. Patient taking differently: Take 25 mg by mouth 2 (two) times daily. 10/11/20  Yes Janith Lima, MD  modafinil (PROVIGIL) 100 MG tablet Take 1 tablet (100 mg total) by mouth daily. Patient taking differently: Take 100 mg by mouth daily as needed (sleep disorder). 03/29/18  Yes [name], MD  PRESCRIPTION MEDICATION 1 Dose by Other route at bedtime as needed (sleep). CPAP   Yes [provider]  Propylene Glycol (SYSTANE BALANCE OP) Place 1 drop into both eyes 4 (four) times daily as needed (dry eyes).   Yes [provider]  SYMBICORT 80-4.5 MCG/ACT inhaler USE 2 PUFFS TWICE A DAY. Patient taking differently: 2 puffs 2 (two) times daily. 10/11/20  Yes Janith Lima, MD  torsemide (DEMADEX) 20 MG tablet TAKE 1 TABLET ONCE DAILY. Patient taking differently: Take 20 mg by mouth daily. 10/11/20  Yes Janith Lima, MD  Vilazodone HCl (VIIBRYD) 40 MG TABS TAKE ONE TABLET ONCE DAILY Patient taking differently: 40 mg daily. 06/11/21  Yes Janith Lima, MD  warfarin (COUMADIN) 2.5 MG tablet TAKE 1 TABLET DAILY EXCEPT 2 TABLETS ON MONDAY, WEDNESDAY, AND FRIDAYS Patient taking differently: Take 1.25-2.5 mg by mouth See admin instructions. Takes 2.5 mg everyday except Wednesday. Takes 1.25 mg on Wednesday. 06/11/21  Yes Janith Lima, MD  ciclopirox (LOPROX) 0.77 % SUSP APPLY 1 PUMP TWICE A DAY. Patient not taking: Reported on 09/05/2021 11/16/19   Janith Lima, MD  ketoconazole (NIZORAL) 2 % cream APPLY TO AFFECTED AREA TWICE A DAY. Patient not taking: Reported on 09/05/2021 08/17/19   Janith Lima, MD  warfarin (COUMADIN) 1 MG tablet Take 1 tablet (1 mg total) by mouth daily. TAKE 1 1/2 TABLETS BY MOUTH ON WEDNESDAYS OR AS DIRECTED BY ANTICOAGULATION CLINIC. Patient not  taking: Reported on 09/05/2021 06/20/21   Janith Lima, MD  Alum & Mag Hydroxide-Simeth (MAGIC MOUTHWASH) SOLN Take 5 mLs by mouth 3 (three) times daily. 14 days then stop  05/19/19  [provider]    Physical Exam: Vitals:   09/05/21 1800 09/05/21 1900 09/05/21 2000 09/05/21 2155  BP: 124/63 133/67 127/65 138/79  Pulse: 62 61 60 64  Resp: 18 (!) 21 19 12   Temp:    98.6 F (37 C)  TempSrc:    Oral  SpO2: 97% 95% 96% 98%  Weight:      Height:       Constitutional: Resting supine in bed, NAD, calm, comfortable Eyes: PERRL, lids and conjunctivae normal ENMT: Mucous membranes are moist. Posterior pharynx clear of any exudate or lesions.Normal dentition.  Neck: normal, supple, no masses. Respiratory: Bibasilar inspiratory crackles otherwise clear to auscultation. Normal respiratory effort. No accessory muscle use.  Cardiovascular: Regular rate and rhythm, soft systolic murmur present. No extremity edema. 2+ pedal pulses.  PPM in place. Abdomen: no tenderness, no masses palpated. No hepatosplenomegaly.  Musculoskeletal: no clubbing / cyanosis. No joint deformity upper and lower extremities. Good ROM, no contractures. Normal muscle tone.  Skin: no rashes, lesions, ulcers. No induration Neurologic: CN 2-12 grossly intact. Sensation intact. Strength 5/5 in all 4.  Psychiatric: Normal judgment and insight. Alert and oriented x 3. Normal mood.   EKG: Personally reviewed. AV paced rhythm.  Similar to prior.  Assessment/Plan Principal Problem:   COVID-19 virus infection Active Problems:   COPD (chronic obstructive pulmonary disease) with acute bronchitis (HCC)   PAF (paroxysmal atrial fibrillation) (HCC)   Chronic diastolic heart failure (HCC)   Chronic kidney disease, stage 3b (HCC)   Depression, major, in partial remission ([place])   Obstructive sleep apnea   Holly Simmons is a 81 y.o. 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  Assessment and Plan: * COVID-19 virus infection- (present on admission) SARS-CoV-2 PCR positive 09/05/2021.  Mild symptoms suggestive of viral bronchitis.  CXR without evidence of pneumonia/infiltrate. -Started on IV remdesivir, continue -Supplemental oxygen if needed -Hold further steroids  COPD (chronic obstructive pulmonary disease) with acute bronchitis ([place])- (present on admission) Stable, continue Symbicort.  PAF (paroxysmal atrial fibrillation) ([place])- (present on admission) Stable in paced rhythm.  Continue Lopressor and Coumadin per pharmacy.  Chronic diastolic heart failure ([place])- (present on admission) Stable and euvolemic on admission.  Continue torsemide 20 mg daily.  Chronic kidney disease, stage 3b ([place])- (present on admission) Stable, creatinine improved from recent baseline.  Depression, major, in partial remission ([place])- (present on admission) Continue home bupropion, Lamictal, Viibryd.  Obstructive sleep apnea- (present on admission) Continue CPAP nightly.  DVT prophylaxis: Coumadin Code Status: DNR, confirmed with patient on admission Family Communication: Discussed with patient, she has discussed with family Disposition Plan: From home and likely discharge to home pending clinical progress Consults called: None Severity of Illness: The appropriate patient status for this patient is INPATIENT. Inpatient status is judged to be reasonable and necessary in order to provide the required intensity of service to ensure the patient's safety. The patient's presenting symptoms, physical exam findings, and initial radiographic and laboratory data in the context of their chronic comorbidities is felt to place them at high risk for  further clinical deterioration. Furthermore, it is not anticipated that the patient will be medically stable for discharge from the hospital within 2 midnights of admission.   * I certify that at the point  of admission it is my clinical judgment that the patient will require inpatient hospital care spanning beyond 2 midnights from the point of admission due to high intensity of service, high risk for further deterioration and high frequency of surveillance required.Zada Finders MD [facility]  If 7PM-7AM, please contact night-coverage [email or web address]  09/06/2021, 12:01 AM

## 2021-09-05 NOTE — Assessment & Plan Note (Signed)
SARS-CoV-2 PCR positive 09/05/2021.  Mild symptoms suggestive of viral bronchitis.  CXR without evidence of pneumonia/infiltrate. -Started on IV remdesivir, continue -Supplemental oxygen if needed -Hold further steroids

## 2021-09-05 NOTE — ED Triage Notes (Signed)
Cough, sore throat and fever since yesterday. She had Tylenol 2 hours ago.

## 2021-09-05 NOTE — ED Notes (Signed)
Ambulated in room, SPO2 94-98%, HR 77 max. Endorsees mild DOE after returning to bed.  SPO2 then dropped to 87% but denies feeling more SHOB.

## 2021-09-05 NOTE — Plan of Care (Signed)

## 2021-09-05 NOTE — Hospital Course (Signed)
Holly Simmons is a 81 y.o. 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

## 2021-09-05 NOTE — ED Notes (Signed)
ED Provider at bedside. 

## 2021-09-05 NOTE — ED Notes (Signed)
X-ray at bedside

## 2021-09-05 NOTE — ED Notes (Signed)
Patient given water per MD approval. 

## 2021-09-06 DIAGNOSIS — R059 Cough, unspecified: Secondary | ICD-10-CM

## 2021-09-06 DIAGNOSIS — I5032 Chronic diastolic (congestive) heart failure: Secondary | ICD-10-CM

## 2021-09-06 DIAGNOSIS — R509 Fever, unspecified: Secondary | ICD-10-CM

## 2021-09-06 DIAGNOSIS — J9601 Acute respiratory failure with hypoxia: Secondary | ICD-10-CM

## 2021-09-06 DIAGNOSIS — I480 Paroxysmal atrial fibrillation: Secondary | ICD-10-CM

## 2021-09-06 DIAGNOSIS — N1832 Chronic kidney disease, stage 3b: Secondary | ICD-10-CM

## 2021-09-06 LAB — CBC WITH DIFFERENTIAL/PLATELET
Abs Immature Granulocytes: 0.04 10*3/uL (ref 0.00–0.07)
Basophils Absolute: 0 10*3/uL (ref 0.0–0.1)
Basophils Relative: 1 %
Eosinophils Absolute: 0 10*3/uL (ref 0.0–0.5)
Eosinophils Relative: 0 %
HCT: 39.1 % (ref 36.0–46.0)
Hemoglobin: 12.2 g/dL (ref 12.0–15.0)
Immature Granulocytes: 1 %
Lymphocytes Relative: 13 %
Lymphs Abs: 0.4 10*3/uL — ABNORMAL LOW (ref 0.7–4.0)
MCH: 31.9 pg (ref 26.0–34.0)
MCHC: 31.2 g/dL (ref 30.0–36.0)
MCV: 102.1 fL — ABNORMAL HIGH (ref 80.0–100.0)
Monocytes Absolute: 0.2 10*3/uL (ref 0.1–1.0)
Monocytes Relative: 5 %
Neutro Abs: 2.5 10*3/uL (ref 1.7–7.7)
Neutrophils Relative %: 80 %
Platelets: 100 10*3/uL — ABNORMAL LOW (ref 150–400)
RBC: 3.83 MIL/uL — ABNORMAL LOW (ref 3.87–5.11)
RDW: 13.9 % (ref 11.5–15.5)
WBC: 3.2 10*3/uL — ABNORMAL LOW (ref 4.0–10.5)
nRBC: 0 % (ref 0.0–0.2)

## 2021-09-06 LAB — COMPREHENSIVE METABOLIC PANEL
ALT: 15 U/L (ref 0–44)
AST: 19 U/L (ref 15–41)
Albumin: 3.5 g/dL (ref 3.5–5.0)
Alkaline Phosphatase: 134 U/L — ABNORMAL HIGH (ref 38–126)
Anion gap: 9 (ref 5–15)
BUN: 31 mg/dL — ABNORMAL HIGH (ref 8–23)
CO2: 28 mmol/L (ref 22–32)
Calcium: 8.6 mg/dL — ABNORMAL LOW (ref 8.9–10.3)
Chloride: 100 mmol/L (ref 98–111)
Creatinine, Ser: 1.71 mg/dL — ABNORMAL HIGH (ref 0.44–1.00)
GFR, Estimated: 30 mL/min — ABNORMAL LOW (ref >60)
Glucose, Bld: 165 mg/dL — ABNORMAL HIGH (ref 70–99)
Potassium: 3.9 mmol/L (ref 3.5–5.1)
Sodium: 137 mmol/L (ref 135–145)
Total Bilirubin: 0.6 mg/dL (ref 0.3–1.2)
Total Protein: 7 g/dL (ref 6.5–8.1)

## 2021-09-06 LAB — PROTIME-INR
INR: 2.4 — ABNORMAL HIGH (ref 0.8–1.2)
Prothrombin Time: 26.2 seconds — ABNORMAL HIGH (ref 11.4–15.2)

## 2021-09-06 MED ORDER — WARFARIN SODIUM 2.5 MG PO TABS
2.5000 mg | ORAL_TABLET | ORAL | Status: DC
  Administered 2021-09-06 – 2021-09-07 (×2): 2.5 mg via ORAL
  Filled 2021-09-06 (×2): qty 1

## 2021-09-06 MED ORDER — FAMOTIDINE 20 MG PO TABS
20.0000 mg | ORAL_TABLET | Freq: Every day | ORAL | Status: DC
  Administered 2021-09-07 – 2021-09-08 (×2): 20 mg via ORAL
  Filled 2021-09-06 (×2): qty 1

## 2021-09-06 MED ORDER — WARFARIN 1.25 MG HALF TABLET: 1.2500 mg | ORAL_TABLET | ORAL | Status: DC

## 2021-09-06 MED ORDER — WARFARIN - PHARMACIST DOSING INPATIENT: Freq: Every day | Status: DC

## 2021-09-06 NOTE — Progress Notes (Signed)
Pt refused CPAP qhs.  Pt states she has not worn it in some time due to not being able to find a mask that fits appropriately.  Pt encouraged to contact RT should she change her mind.

## 2021-09-06 NOTE — Assessment & Plan Note (Signed)
Stable and euvolemic on admission.  Continue torsemide 20 mg daily.

## 2021-09-06 NOTE — Progress Notes (Signed)
ANTICOAGULATION CONSULT NOTE - Initial Consult  Pharmacy Consult for Coumadin Indication: atrial fibrillation   Allergies  Allergen Reactions   Petrolatum-Zinc Oxide Anaphylaxis, Rash and Swelling   Sulfa Antibiotics Rash   Sulfonamide Derivatives Anaphylaxis and Swelling   Tetanus Toxoid     Other reaction(s): Other (See Comments) OTHER REACTION   Tetanus Toxoids Swelling   Other Rash and Other (See Comments)    STERI - STRIPS  It can affect proteins in her kidneys so she doesn't take REACTION: proteinuria Other reaction(s): Other (See Comments), Unknown It can affect proteins in her kidneys so she doesn't take   Nsaids Other (See Comments)    REACTION: proteinuria Other reaction(s): Other (See Comments), Unknown It can affect proteins in her kidneys so she doesn't take    Tetanus Toxoid, Adsorbed Other (See Comments)    Other reaction(s): Other (See Comments) Turned arm red Turned arm red    Patient Measurements: Height: 5\' 3"  (160 cm) Weight: 68 kg (149 lb 14.6 oz) IBW/kg (Calculated) : 52.4  Vital Signs: Temp: 98.6 F (37 C) (02/02 2155) Temp Source: Oral (02/02 2155) BP: 138/79 (02/02 2155) Pulse Rate: 64 (02/02 2155)  Labs: Recent Labs    09/05/21 1324 09/05/21 1439  HGB 11.7*  --   HCT 35.7*  --   PLT 104*  --   LABPROT  --  26.8*  INR  --  2.5*  CREATININE 1.55*  --     Estimated Creatinine Clearance: 27.7 mL/min (A) (by C-G formula based on SCr of 1.55 mg/dL (H)).   Medical History: Past Medical History:  Diagnosis Date   Allergic rhinitis    Asthma    NOS w/ acute exacerbation   Blood transfusion without reported diagnosis    CKD (chronic kidney disease) stage 3, GFR 30-59 ml/min (HCC) 09/12/2018   Colon polyps    TUBULAR ADENOMAS AND HYPERPLASTIC   Complete heart block (HCC)    requiring PPM (MDT) post surgical myomectomy at [facility],  leads are epicardial with abdominal implant, high ventricular threshold at implant   COPD (chronic  obstructive pulmonary disease) (HCC)    Depressive disorder    Diastolic dysfunction    DM (diabetes mellitus) ([place])    Gallstones    GERD (gastroesophageal reflux disease)    Gout 08/20/2012   Heart murmur    Hyperpotassemia    Hypersomnia    Hypertension    Hypertrophic cardiomyopathy ([place])    s/p surgical myomectomy at [facility] 99/83 complicated by septal VSD post procedure requiring reoperation with patch closure and tricuspid valve replacement   Kidney stones    Myocardial infarction ([place]) 2011   Obstructive sleep apnea    persistent daytime sleepiness despite cpap   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   Psoriasis    Pyuria    Renal insufficiency    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 Admitted for Covid-19 infection.  Goal of Therapy:  INR 2-3   Plan:  Continue Coumadin 2.5mg  tab daily, Coumadin 1.25 mg on Wednesdays  Monitor INR    Deirdre Evener, PharmD student  09/06/2021,12:51 AM

## 2021-09-06 NOTE — Assessment & Plan Note (Signed)
Stable in paced rhythm.  Continue Lopressor and Coumadin per pharmacy.

## 2021-09-06 NOTE — Progress Notes (Signed)
[facility]  PROGRESS NOTE  Holly Simmons ION:[id number] DOB: 1940-11-13 DOA: 09/05/2021 PCP: Janith Lima, MD   Brief HPI:   81 year old female with medical history of paroxysmal atrial fibrillation on Coumadin, hypertrophic cardiomyopathy, s/p myomectomy and bioprosthetic TVR, chronic diastolic CHF with EF 24%, CKD stage IIIb, COPD, pharyngeal dysphagia, depression came to ED with complaints of cough and fever.  Temperature was 101 F.  Also with poor appetite and fatigue.  In the ED she was found to have COVID-19 infection.    Subjective   Patient seen and examined, denies shortness of breath.  Denies any coughing.  Currently requiring 2 L/min of oxygen via nasal cannula   Assessment/Plan:    COVID-19 infection -Patient presented with cough and fever, SARS-CoV2 RT-PCR was positive -Patient started on IV remdesivir -Steroids were held on admission -Chest x-ray did not show pneumonia or infiltrate -Continue supplemental oxygen as needed -CRP was 7.3, D-dimer 0.35 -Follow CRP in a.m.  COPD -Stable, no wheezing -Continue Symbicort  Paroxysmal atrial fibrillation -Stable in paced rhythm -Continue Lopressor -Continue Coumadin for anticoagulation  CKD stage IIIb -Stable  Depression -Continue bupropion, Lamictal, Viibryd  OSA -Continue CPAP nightly  Chronic diastolic heart failure -Patient is euvolemic, creatinine mildly elevated today -Hold further torsemide -Follow BMP in am       Medications     allopurinol  100 mg Oral Daily   buPROPion  300 mg Oral Daily   dronabinol  5 mg Oral BID AC   [START ON 09/07/2021] famotidine  20 mg Oral Daily   ferrous sulfate  325 mg Oral BID WC   lamoTRIgine  25 mg Oral Daily   metoprolol tartrate  25 mg Oral BID   mometasone-formoterol  2 puff Inhalation BID   torsemide  20 mg Oral Daily   Vilazodone HCl  40 mg Oral Daily   warfarin  2.5 mg Oral Once per day on Sun Mon Tue Thu Fri Sat   And   [START ON  09/11/2021] warfarin  1.25 mg Oral Once per day on Wed     Data Reviewed:   CBG:  No results for input(s): GLUCAP in the last 168 hours.  SpO2: 99 % O2 Flow Rate (L/min): 2 L/min    Vitals:   09/05/21 2000 09/05/21 2155 09/06/21 0622 09/06/21 0746  BP: 127/65 138/79 132/62 137/72  Pulse: 60 64 71 66  Resp: 19 12 16 16   Temp:  98.6 F (37 C) 97.9 F (36.6 C) 97.6 F (36.4 C)  TempSrc:  Oral Oral Oral  SpO2: 96% 98% 100% 99%  Weight:      Height:          Data Reviewed:  Basic Metabolic Panel: Recent Labs  Lab 09/05/21 1324 09/06/21 0330  NA 138 137  K 3.6 3.9  CL 102 100  CO2 28 28  GLUCOSE 111* 165*  BUN 25* 31*  CREATININE 1.55* 1.71*  CALCIUM 8.7* 8.6*    CBC: Recent Labs  Lab 09/05/21 1324 09/06/21 0330  WBC 6.3 3.2*  NEUTROABS 4.5 2.5  HGB 11.7* 12.2  HCT 35.7* 39.1  MCV 97.5 102.1*  PLT 104* 100*       Antibiotics: Anti-infectives (From admission, onward)    Start     Dose/Rate Route Frequency Ordered Stop   09/06/21 1000  remdesivir 100 mg in sodium chloride 0.9 % 100 mL IVPB        100 mg 200 mL/hr over 30 Minutes Intravenous Daily  09/05/21 1431 09/10/21 0959   09/05/21 1530  remdesivir 100 mg in sodium chloride 0.9 % 100 mL IVPB        100 mg 200 mL/hr over 30 Minutes Intravenous  Once 09/05/21 1431 09/05/21 1554   09/05/21 1500  remdesivir 100 mg in sodium chloride 0.9 % 100 mL IVPB        100 mg 200 mL/hr over 30 Minutes Intravenous  Once 09/05/21 1431 09/05/21 1553        DVT prophylaxis: Warfarin  Code Status: DNR  Family Communication: No family at bedside      Objective    Physical Examination:  General-appears in no acute distress Heart-S1-S2, regular, no murmur auscultated Lungs-clear to auscultation bilaterally, no wheezing or crackles auscultated Abdomen-soft, nontender, no organomegaly Extremities-no edema in the lower extremities Neuro-alert, oriented x3, no focal deficit noted  Status is:  Inpatient for COVID-19 infection             Bret Harte   [facility] If 7PM-7AM, please contact night-coverage at [email or web address], Office  [phone]   09/06/2021, 2:23 PM  LOS: 1 day

## 2021-09-06 NOTE — Assessment & Plan Note (Signed)
Stable, continue Symbicort.

## 2021-09-06 NOTE — Assessment & Plan Note (Signed)
Continue home bupropion, Lamictal, Viibryd.

## 2021-09-06 NOTE — Progress Notes (Signed)
[place] for Coumadin Indication: atrial fibrillation   Allergies  Allergen Reactions   Petrolatum-Zinc Oxide Anaphylaxis, Rash and Swelling   Sulfa Antibiotics Rash   Sulfonamide Derivatives Anaphylaxis and Swelling   Tetanus Toxoid     Other reaction(s): Other (See Comments) OTHER REACTION   Tetanus Toxoids Swelling   Other Rash and Other (See Comments)    STERI - STRIPS  It can affect proteins in her kidneys so she doesn't take REACTION: proteinuria Other reaction(s): Other (See Comments), Unknown It can affect proteins in her kidneys so she doesn't take   Nsaids Other (See Comments)    REACTION: proteinuria Other reaction(s): Other (See Comments), Unknown It can affect proteins in her kidneys so she doesn't take    Tetanus Toxoid, Adsorbed Other (See Comments)    Other reaction(s): Other (See Comments) Turned arm red Turned arm red    Patient Measurements: Height: 5\' 3"  (160 cm) Weight: 68 kg (149 lb 14.6 oz) IBW/kg (Calculated) : 52.4  Vital Signs: Temp: 97.6 F (36.4 C) (02/03 0746) Temp Source: Oral (02/03 0746) BP: 137/72 (02/03 0746) Pulse Rate: 66 (02/03 0746)  Labs: Recent Labs    09/05/21 1324 09/05/21 1439 09/06/21 0330  HGB 11.7*  --  12.2  HCT 35.7*  --  39.1  PLT 104*  --  100*  LABPROT  --  26.8* 26.2*  INR  --  2.5* 2.4*  CREATININE 1.55*  --  1.71*     Estimated Creatinine Clearance: 25.1 mL/min (A) (by C-G formula based on SCr of 1.71 mg/dL (H)).   Medications:  Coumadin 2.5mg  tab daily except Coumadin 1.25mg  on Wednesdays   Assessment: Patient on Coumadin for Afib, INR therapeutic on admission. Admitted for Covid-19 infection.  Today, 09/06/2021: Hgb improved to WNL; Plt low but stable INR therapeutic and stable No meal intake charted No drug interactions with warfarin noted  Goal of Therapy:  INR 2-3   Plan:  Continue Coumadin 1.25 mg on Wednesdays; 2.5 mg all other days Monitor INR     Reuel Boom, PharmD, BCPS [phone] 09/06/2021, 10:41 AM

## 2021-09-06 NOTE — Assessment & Plan Note (Signed)
Stable, creatinine improved from recent baseline.

## 2021-09-06 NOTE — Assessment & Plan Note (Signed)
Continue CPAP nightly. °

## 2021-09-06 NOTE — Progress Notes (Signed)
Transition of Care [facility]) Screening Note  Patient Details  Name: KEILIN GAMBOA Date of Birth: [birth year]  Transition of Care [facility]) CM/SW Contact:    Sherie Don, LCSW Phone Number: 09/06/2021, 9:50 AM  Transition of Care Department [facility]) has reviewed patient and no TOC needs have been identified at this time. We will continue to monitor patient advancement through interdisciplinary progression rounds. If new patient transition needs arise, please place a TOC consult.

## 2021-09-07 DIAGNOSIS — G4733 Obstructive sleep apnea (adult) (pediatric): Secondary | ICD-10-CM

## 2021-09-07 LAB — CBC
HCT: 37.9 % (ref 36.0–46.0)
Hemoglobin: 12.3 g/dL (ref 12.0–15.0)
MCH: 31.5 pg (ref 26.0–34.0)
MCHC: 32.5 g/dL (ref 30.0–36.0)
MCV: 97.2 fL (ref 80.0–100.0)
Platelets: 140 10*3/uL — ABNORMAL LOW (ref 150–400)
RBC: 3.9 MIL/uL (ref 3.87–5.11)
RDW: 13.8 % (ref 11.5–15.5)
WBC: 6.6 10*3/uL (ref 4.0–10.5)
nRBC: 0 % (ref 0.0–0.2)

## 2021-09-07 LAB — BASIC METABOLIC PANEL
Anion gap: 9 (ref 5–15)
BUN: 47 mg/dL — ABNORMAL HIGH (ref 8–23)
CO2: 29 mmol/L (ref 22–32)
Calcium: 8 mg/dL — ABNORMAL LOW (ref 8.9–10.3)
Chloride: 98 mmol/L (ref 98–111)
Creatinine, Ser: 1.73 mg/dL — ABNORMAL HIGH (ref 0.44–1.00)
GFR, Estimated: 30 mL/min — ABNORMAL LOW (ref >60)
Glucose, Bld: 101 mg/dL — ABNORMAL HIGH (ref 70–99)
Potassium: 3.3 mmol/L — ABNORMAL LOW (ref 3.5–5.1)
Sodium: 136 mmol/L (ref 135–145)

## 2021-09-07 LAB — C-REACTIVE PROTEIN: CRP: 5.6 mg/dL — ABNORMAL HIGH (ref <1.0)

## 2021-09-07 LAB — PROTIME-INR
INR: 3 — ABNORMAL HIGH (ref 0.8–1.2)
Prothrombin Time: 31.1 seconds — ABNORMAL HIGH (ref 11.4–15.2)

## 2021-09-07 MED ORDER — POTASSIUM CHLORIDE CRYS ER 20 MEQ PO TBCR
40.0000 meq | EXTENDED_RELEASE_TABLET | Freq: Once | ORAL | Status: AC
  Administered 2021-09-07: 40 meq via ORAL
  Filled 2021-09-07: qty 2

## 2021-09-07 NOTE — Progress Notes (Signed)
[facility]  PROGRESS NOTE  Holly Simmons WER:[id number] DOB: [birth year] DOA: 09/05/2021 PCP: Janith Lima, MD   Brief HPI:   81 year old female with medical history of paroxysmal atrial fibrillation on Coumadin, hypertrophic cardiomyopathy, s/p myomectomy and bioprosthetic TVR, chronic diastolic CHF with EF 19%, CKD stage IIIb, COPD, pharyngeal dysphagia, depression came to ED with complaints of cough and fever.  Temperature was 101 F.  Also with poor appetite and fatigue.  In the ED she was found to have COVID-19 infection.    Subjective   Patient seen and examined, oxygen has been weaned off.   Assessment/Plan:    COVID-19 infection -Patient presented with cough and fever, SARS-CoV2 RT-PCR was positive -Patient started on IV remdesivir -Steroids were held on admission -Chest x-ray did not show pneumonia or infiltrate -Oxygen weaned off to room air; oxygen saturation 99% on RA -CRP is down to 5.6, D-dimer 0.35 -We will discharge home in a.m. if continues to do well -  COPD -Stable, no wheezing -Continue Symbicort  Paroxysmal atrial fibrillation -Stable in paced rhythm -Continue Lopressor -Continue Coumadin for anticoagulation  CKD stage IIIb -Stable  Depression -Continue bupropion, Lamictal, Viibryd  OSA -Continue CPAP nightly  Chronic diastolic heart failure -Patient is euvolemic, creatinine mildly elevated today -Hold further torsemide -Follow BMP in am  Hypokalemia -Potassium is 3.3, will replace potassium and follow BMP in am       Medications     allopurinol  100 mg Oral Daily   buPROPion  300 mg Oral Daily   dronabinol  5 mg Oral BID AC   famotidine  20 mg Oral Daily   ferrous sulfate  325 mg Oral BID WC   lamoTRIgine  25 mg Oral Daily   metoprolol tartrate  25 mg Oral BID   mometasone-formoterol  2 puff Inhalation BID   Vilazodone HCl  40 mg Oral Daily   warfarin  2.5 mg Oral Once per day on Sun Mon Tue Thu Fri Sat    And   [START ON 09/11/2021] warfarin  1.25 mg Oral Once per day on Wed     Data Reviewed:   CBG:  No results for input(s): GLUCAP in the last 168 hours.  SpO2: 99 % Room air   Vitals:   09/06/21 1800 09/06/21 2137 09/06/21 2138 09/07/21 0536  BP:   (!) 124/52 (!) 107/50  Pulse:  86 78 (!) 58  Resp:   18 17  Temp:   98.8 F (37.1 C) 98.5 F (36.9 C)  TempSrc:   Oral Oral  SpO2: 96% 96% 96% 99%  Weight:      Height:          Data Reviewed:  Basic Metabolic Panel: Recent Labs  Lab 09/05/21 1324 09/06/21 0330 09/07/21 0349  NA 138 137 136  K 3.6 3.9 3.3*  CL 102 100 98  CO2 28 28 29   GLUCOSE 111* 165* 101*  BUN 25* 31* 47*  CREATININE 1.55* 1.71* 1.73*  CALCIUM 8.7* 8.6* 8.0*    CBC: Recent Labs  Lab 09/05/21 1324 09/06/21 0330 09/07/21 0349  WBC 6.3 3.2* 6.6  NEUTROABS 4.5 2.5  --   HGB 11.7* 12.2 12.3  HCT 35.7* 39.1 37.9  MCV 97.5 102.1* 97.2  PLT 104* 100* 140*       Antibiotics: Anti-infectives (From admission, onward)    Start     Dose/Rate Route Frequency Ordered Stop   09/06/21 1000  remdesivir 100 mg in sodium chloride 0.9 %  100 mL IVPB        100 mg 200 mL/hr over 30 Minutes Intravenous Daily 09/05/21 1431 09/10/21 0959   09/05/21 1530  remdesivir 100 mg in sodium chloride 0.9 % 100 mL IVPB        100 mg 200 mL/hr over 30 Minutes Intravenous  Once 09/05/21 1431 09/05/21 1554   09/05/21 1500  remdesivir 100 mg in sodium chloride 0.9 % 100 mL IVPB        100 mg 200 mL/hr over 30 Minutes Intravenous  Once 09/05/21 1431 09/05/21 1553        DVT prophylaxis: Warfarin  Code Status: DNR  Family Communication: No family at bedside      Objective    Physical Examination:  General-appears in no acute distress Heart-S1-S2, regular, no murmur auscultated Lungs-clear to auscultation bilaterally, no wheezing or crackles auscultated Abdomen-soft, nontender, no organomegaly Extremities-no edema in the lower  extremities Neuro-alert, oriented x3, no focal deficit noted  Status is: Inpatient for COVID-19 infection             [place]   [facility] If 7PM-7AM, please contact night-coverage at [email or web address], Office  [phone]   09/07/2021, 1:50 PM  LOS: 2 days

## 2021-09-07 NOTE — Progress Notes (Signed)
Pt has declined CPAP QHS while she is here.

## 2021-09-07 NOTE — Plan of Care (Signed)
  Problem: Education: Goal: Knowledge of General Education information will improve Description: Including pain rating scale, medication(s)/side effects and non-pharmacologic comfort measures Outcome: Progressing   Problem: Activity: Goal: Risk for activity intolerance will decrease Outcome: Progressing   Problem: Pain Managment: Goal: General experience of comfort will improve Outcome: Progressing   

## 2021-09-07 NOTE — Plan of Care (Signed)
  Problem: Pain Managment: Goal: General experience of comfort will improve Outcome: Progressing   Problem: Safety: Goal: Ability to remain free from injury will improve Outcome: Progressing   

## 2021-09-07 NOTE — Progress Notes (Signed)
[place] for Coumadin Indication: atrial fibrillation   Allergies  Allergen Reactions   Petrolatum-Zinc Oxide Anaphylaxis, Rash and Swelling   Sulfa Antibiotics Rash   Sulfonamide Derivatives Anaphylaxis and Swelling   Tetanus Toxoid     Other reaction(s): Other (See Comments) OTHER REACTION   Tetanus Toxoids Swelling   Other Rash and Other (See Comments)    STERI - STRIPS  It can affect proteins in her kidneys so she doesn't take REACTION: proteinuria Other reaction(s): Other (See Comments), Unknown It can affect proteins in her kidneys so she doesn't take   Nsaids Other (See Comments)    REACTION: proteinuria Other reaction(s): Other (See Comments), Unknown It can affect proteins in her kidneys so she doesn't take    Tetanus Toxoid, Adsorbed Other (See Comments)    Other reaction(s): Other (See Comments) Turned arm red Turned arm red    Patient Measurements: Height: 5\' 3"  (160 cm) Weight: 68 kg (149 lb 14.6 oz) IBW/kg (Calculated) : 52.4  Vital Signs: Temp: 98.5 F (36.9 C) (02/04 0536) Temp Source: Oral (02/04 0536) BP: 107/50 (02/04 0536) Pulse Rate: 58 (02/04 0536)  Labs: Recent Labs    09/05/21 1324 09/05/21 1439 09/06/21 0330 09/07/21 0349  HGB 11.7*  --  12.2 12.3  HCT 35.7*  --  39.1 37.9  PLT 104*  --  100* 140*  LABPROT  --  26.8* 26.2* 31.1*  INR  --  2.5* 2.4* 3.0*  CREATININE 1.55*  --  1.71* 1.73*     Estimated Creatinine Clearance: 24.8 mL/min (A) (by C-G formula based on SCr of 1.73 mg/dL (H)).   Medications:  Coumadin 2.5mg  tab daily except Coumadin 1.25mg  on Wednesdays   Assessment: Patient on Coumadin for Afib, INR therapeutic on admission. Admitted for Covid-19 infection.  Today, 09/07/2021: Hgb improved to WNL; Plt low but stable INR therapeutic but up to borderline high this AM; could be related to acute COVID infxn Eating 100% of meals (when charted) No drug interactions with warfarin  noted  Goal of Therapy:  INR 2-3   Plan:  Continue Coumadin 1.25 mg on Wednesdays; 2.5 mg all other days INR rise may be transient; can reduce/hold warf tomorrow if continues to rise Monitor INR    Reuel Boom, PharmD, BCPS [phone] 09/07/2021, 12:02 PM

## 2021-09-08 LAB — PROTIME-INR
INR: 2.7 — ABNORMAL HIGH (ref 0.8–1.2)
Prothrombin Time: 28.2 seconds — ABNORMAL HIGH (ref 11.4–15.2)

## 2021-09-08 LAB — BASIC METABOLIC PANEL
Anion gap: 7 (ref 5–15)
BUN: 44 mg/dL — ABNORMAL HIGH (ref 8–23)
CO2: 28 mmol/L (ref 22–32)
Calcium: 8.6 mg/dL — ABNORMAL LOW (ref 8.9–10.3)
Chloride: 101 mmol/L (ref 98–111)
Creatinine, Ser: 1.49 mg/dL — ABNORMAL HIGH (ref 0.44–1.00)
GFR, Estimated: 36 mL/min — ABNORMAL LOW (ref >60)
Glucose, Bld: 92 mg/dL (ref 70–99)
Potassium: 4 mmol/L (ref 3.5–5.1)
Sodium: 136 mmol/L (ref 135–145)

## 2021-09-08 NOTE — Progress Notes (Addendum)
[place] for Coumadin Indication: atrial fibrillation   Allergies  Allergen Reactions   Petrolatum-Zinc Oxide Anaphylaxis, Rash and Swelling   Sulfa Antibiotics Rash   Sulfonamide Derivatives Anaphylaxis and Swelling   Tetanus Toxoid     Other reaction(s): Other (See Comments) OTHER REACTION   Tetanus Toxoids Swelling   Other Rash and Other (See Comments)    STERI - STRIPS  It can affect proteins in her kidneys so she doesn't take REACTION: proteinuria Other reaction(s): Other (See Comments), Unknown It can affect proteins in her kidneys so she doesn't take   Nsaids Other (See Comments)    REACTION: proteinuria Other reaction(s): Other (See Comments), Unknown It can affect proteins in her kidneys so she doesn't take    Tetanus Toxoid, Adsorbed Other (See Comments)    Other reaction(s): Other (See Comments) Turned arm red Turned arm red    Patient Measurements: Height: 5\' 3"  (160 cm) Weight: 68 kg (149 lb 14.6 oz) IBW/kg (Calculated) : 52.4  Vital Signs: Temp: 98.3 F (36.8 C) (02/05 0602) Temp Source: Oral (02/05 0602) BP: 126/65 (02/05 0602) Pulse Rate: 61 (02/05 0602)  Labs: Recent Labs    09/05/21 1324 09/05/21 1439 09/06/21 0330 09/07/21 0349 09/08/21 0339  HGB 11.7*  --  12.2 12.3  --   HCT 35.7*  --  39.1 37.9  --   PLT 104*  --  100* 140*  --   LABPROT  --    < > 26.2* 31.1* 28.2*  INR  --    < > 2.4* 3.0* 2.7*  CREATININE 1.55*  --  1.71* 1.73* 1.49*   < > = values in this interval not displayed.     Estimated Creatinine Clearance: 28.8 mL/min (A) (by C-G formula based on SCr of 1.49 mg/dL (H)).   Medications:  Coumadin 2.5mg  tab daily except Coumadin 1.25mg  on Wednesdays   Assessment: Patient on Coumadin for Afib, INR therapeutic on admission. Admitted for Covid-19 infection.  Today, 09/08/2021: Hgb improved to WNL; Plt low but stable INR therapeutic and stable today Eating 100% of meals (when  charted) No drug interactions with warfarin noted  Goal of Therapy:  INR 2-3   Plan:  Continue Coumadin 1.25 mg on Wednesdays; 2.5 mg all other days - OK to discharge on home regimen   Reuel Boom, PharmD, BCPS [phone] 09/08/2021, 11:39 AM

## 2021-09-08 NOTE — Plan of Care (Signed)
°  Problem: Education: Goal: Knowledge of General Education information will improve Description: Including pain rating scale, medication(s)/side effects and non-pharmacologic comfort measures Outcome: Progressing   Problem: Health Behavior/Discharge Planning: Goal: Ability to manage health-related needs will improve Outcome: Progressing   Problem: Clinical Measurements: Goal: Will remain free from infection Outcome: Progressing Goal: Diagnostic test results will improve Outcome: Progressing Goal: Respiratory complications will improve Outcome: Progressing Goal: Cardiovascular complication will be avoided Outcome: Progressing   Problem: Activity: Goal: Risk for activity intolerance will decrease Outcome: Progressing   Problem: Nutrition: Goal: Adequate nutrition will be maintained Outcome: Progressing   Problem: Coping: Goal: Level of anxiety will decrease Outcome: Progressing   Problem: Elimination: Goal: Will not experience complications related to bowel motility Outcome: Progressing Goal: Will not experience complications related to urinary retention Outcome: Progressing   Problem: Pain Managment: Goal: General experience of comfort will improve Outcome: Progressing   Problem: Safety: Goal: Ability to remain free from injury will improve Outcome: Progressing   Problem: Skin Integrity: Goal: Risk for impaired skin integrity will decrease Outcome: Progressing   Problem: Education: Goal: Knowledge of risk factors and measures for prevention of condition will improve Outcome: Progressing   Problem: Coping: Goal: Psychosocial and spiritual needs will be supported Outcome: Progressing   Problem: Respiratory: Goal: Will maintain a patent airway Outcome: Progressing Goal: Complications related to the disease process, condition or treatment will be avoided or minimized Outcome: Progressing

## 2021-09-08 NOTE — Plan of Care (Signed)
Patient discharged.

## 2021-09-08 NOTE — Discharge Instructions (Signed)
Continue quarantine for Covid 19 for 6 more days till 09/13/21

## 2021-09-08 NOTE — Discharge Summary (Signed)
Physician Discharge Summary   Patient: Holly Simmons MRN: [id number] DOB: [birth year]  Admit date:     09/05/2021  Discharge date: 09/08/21  Discharge Physician: Oswald Hillock   PCP: Janith Lima, MD   Recommendations at discharge:    Continue quarantine for 6 more days till 09/13/2021  Discharge Diagnoses: Principal Problem:   COVID-19 virus infection Active Problems:   Depression, major, in partial remission ([place])   Obstructive sleep apnea   Chronic diastolic heart failure (HCC)   COPD (chronic obstructive pulmonary disease) with acute bronchitis (HCC)   PAF (paroxysmal atrial fibrillation) (HCC)   Chronic kidney disease, stage 3b [facility])    Hospital Course: Holly Simmons is a 81 y.o. female with medical history significant for paroxysmal atrial fibrillation on Coumadin, hypertrophic cardiomyopathy (s/p myomectomy and bioprosthetic TVR), chronic diastolic CHF (EF 09-62%, G2 DD by TTE 06/14/2021), CKD stage IIIb, COPD, pharyngeal dysphagia, and depression who is admitted with COVID-19 viral infection.  Assessment and Plan:    COVID-19 infection -Patient presented with cough and fever, SARS-CoV2 RT-PCR was positive -Patient started on IV remdesivir; today is day #4 of IV remdesivir.  Patient is asymptomatic not requiring oxygen.  Will discharge home today. -Steroids were held on admission -Chest x-ray did not show pneumonia or infiltrate -Oxygen weaned off to room air; oxygen saturation 99% on RA -CRP is down to 5.6, D-dimer 0.35 -We will discharge home     COPD -Stable, no wheezing -Continue Symbicort   Paroxysmal atrial fibrillation -Stable in paced rhythm -Continue Lopressor -Continue Coumadin for anticoagulation -INR is therapeutic   CKD stage IIIb -Stable   Depression -Continue bupropion, Lamictal, Viibryd   OSA -Continue CPAP nightly   Chronic diastolic heart failure -Patient is euvolemic -Continue torsemide   Hypokalemia -Replete                Consultants:  Procedures performed:   Disposition: Home Diet recommendation:  Discharge Diet Orders (From admission, onward)     Start     Ordered   09/08/21 0000  Diet - low sodium heart healthy        09/08/21 1019           Regular diet  DISCHARGE MEDICATION: Allergies as of 09/08/2021       Reactions   Petrolatum-zinc Oxide Anaphylaxis, Rash, Swelling   Sulfa Antibiotics Rash   Sulfonamide Derivatives Anaphylaxis, Swelling   Tetanus Toxoid    Other reaction(s): Other (See Comments) OTHER REACTION   Tetanus Toxoids Swelling   Other Rash, Other (See Comments)   STERI - STRIPS  It can affect proteins in her kidneys so she doesn't take REACTION: proteinuria Other reaction(s): Other (See Comments), Unknown It can affect proteins in her kidneys so she doesn't take   Nsaids Other (See Comments)   REACTION: proteinuria Other reaction(s): Other (See Comments), Unknown It can affect proteins in her kidneys so she doesn't take   Tetanus Toxoid, Adsorbed Other (See Comments)   Other reaction(s): Other (See Comments) Turned arm red Turned arm red        Medication List     TAKE these medications    allopurinol 100 MG tablet Commonly known as: ZYLOPRIM TAKE 1 TABLET BY MOUTH DAILY.   amoxicillin 500 MG capsule Commonly known as: AMOXIL TAKE 4 CAPSULES 1 HOUR PRIOR TO PROCEDURE What changed: See the new instructions.   Betamethasone Valerate 0.12 % foam APPLY TO SCALP AS DIRECTED. What changed:  how much to take when  to take this additional instructions   buPROPion 300 MG 24 hr tablet Commonly known as: WELLBUTRIN XL TAKE ONE TABLET BY MOUTH ONCE DAILY What changed: how to take this   ciclopirox 0.77 % Susp Commonly known as: LOPROX APPLY 1 PUMP TWICE A DAY.   dronabinol 5 MG capsule Commonly known as: MARINOL TAKE ONE CAPSULE ONCE DAILY BEFORE LUNCH AND TAKE ONE CAPSULE BEFORE SUPPER What changed: See the new instructions.    famotidine 40 MG tablet Commonly known as: PEPCID TAKE ONE TABLET ONCE DAILY   ferrous sulfate 325 (65 FE) MG tablet Take 1 tablet (325 mg total) by mouth 2 (two) times daily with a meal. What changed: how much to take   fexofenadine 180 MG tablet Commonly known as: ALLEGRA Take 180 mg by mouth daily as needed for allergies.   ipratropium 0.03 % nasal spray Commonly known as: ATROVENT USE 2 SPRAYS IN EACH NOSTRIL 3 TIMES A DAY AS NEEDED. What changed: See the new instructions.   ketoconazole 2 % cream Commonly known as: NIZORAL APPLY TO AFFECTED AREA TWICE A DAY.   lamoTRIgine 25 MG tablet Commonly known as: LAMICTAL TAKE ONE TABLET BY MOUTH ONCE DAILY   metoprolol tartrate 25 MG tablet Commonly known as: LOPRESSOR TAKE 1 TABLET BY MOUTH TWICE DAILY.   modafinil 100 MG tablet Commonly known as: PROVIGIL Take 1 tablet (100 mg total) by mouth daily. What changed:  when to take this reasons to take this   PRESCRIPTION MEDICATION 1 Dose by Other route at bedtime as needed (sleep). CPAP   Symbicort 80-4.5 MCG/ACT inhaler Generic drug: budesonide-formoterol USE 2 PUFFS TWICE A DAY. What changed: See the new instructions.   SYSTANE BALANCE OP Place 1 drop into both eyes 4 (four) times daily as needed (dry eyes).   torsemide 20 MG tablet Commonly known as: DEMADEX TAKE 1 TABLET ONCE DAILY.   Vilazodone HCl 40 MG Tabs Commonly known as: VIIBRYD TAKE ONE TABLET ONCE DAILY What changed: how to take this   warfarin 2.5 MG tablet Commonly known as: COUMADIN Take as directed. If you are unsure how to take this medication, talk to your nurse or doctor. Original instructions: TAKE 1 TABLET DAILY EXCEPT 2 TABLETS ON MONDAY, WEDNESDAY, AND FRIDAYS What changed:  See the new instructions. Another medication with the same name was removed. Continue taking this medication, and follow the directions you see here.         Discharge Exam: Filed Weights   09/05/21 1157   Weight: 68 kg   General-appears in no acute distress Heart-S1-S2, regular, no murmur auscultated Lungs-clear to auscultation bilaterally, no wheezing or crackles auscultated Abdomen-soft, nontender, no organomegaly Extremities-no edema in the lower extremities Neuro-alert, oriented x3, no focal deficit noted  Condition at discharge: good  The results of significant diagnostics from this hospitalization (including imaging, microbiology, ancillary and laboratory) are listed below for reference.   Imaging Studies: DG Chest Port 1 View  Result Date: 09/05/2021 CLINICAL DATA:  Cough and fever for 2 days EXAM: PORTABLE CHEST 1 VIEW COMPARISON:  11/17/2017, 07/12/2020, 10/17/2011 FINDINGS: 5 mm nodular opacity projecting over the right mid lung unchanged compared with 10/17/2011. No focal consolidation. No pleural effusion or pneumothorax. Heart and mediastinal contours are unremarkable. Prior median sternotomy. Dual lead cardiac pacemaker again noted. No acute osseous abnormality. IMPRESSION: 1. No acute cardiopulmonary disease. Electronically Signed   By: Kathreen Devoid M.D.   On: 09/05/2021 13:29    Microbiology: Results for orders placed or  performed during the hospital encounter of 09/05/21  Resp Panel by RT-PCR (Flu A&B, Covid) Nasopharyngeal Swab     Status: Abnormal   Collection Time: 09/05/21 12:04 PM   Specimen: Nasopharyngeal Swab; Nasopharyngeal(NP) swabs in vial transport medium  Result Value Ref Range Status   SARS Coronavirus 2 by RT PCR POSITIVE (A) NEGATIVE Final    Comment: (NOTE) SARS-CoV-2 target nucleic acids are DETECTED.  The SARS-CoV-2 RNA is generally detectable in upper respiratory specimens during the acute phase of infection. Positive results are indicative of the presence of the identified virus, but do not rule out bacterial infection or co-infection with other pathogens not detected by the test. Clinical correlation with patient history and other diagnostic  information is necessary to determine patient infection status. The expected result is Negative.  Fact Sheet for Patients: EntrepreneurPulse.com.au  Fact Sheet for Healthcare Providers: IncredibleEmployment.be  This test is not yet approved or cleared by the [place] FDA and  has been authorized for detection and/or diagnosis of SARS-CoV-2 by FDA under an Emergency Use Authorization (EUA).  This EUA will remain in effect (meaning this test can be used) for the duration of  the COVID-19 declaration under Section 564(b)(1) of the A ct, 21 U.S.C. section 360bbb-3(b)(1), unless the authorization is terminated or revoked sooner.     Influenza A by PCR NEGATIVE NEGATIVE Final   Influenza B by PCR NEGATIVE NEGATIVE Final    Comment: (NOTE) The Xpert Xpress SARS-CoV-2/FLU/RSV plus assay is intended as an aid in the diagnosis of influenza from Nasopharyngeal swab specimens and should not be used as a sole basis for treatment. Nasal washings and aspirates are unacceptable for Xpert Xpress SARS-CoV-2/FLU/RSV testing.  Fact Sheet for Patients: EntrepreneurPulse.com.au  Fact Sheet for Healthcare Providers: IncredibleEmployment.be  This test is not yet approved or cleared by the [place] FDA and has been authorized for detection and/or diagnosis of SARS-CoV-2 by FDA under an Emergency Use Authorization (EUA). This EUA will remain in effect (meaning this test can be used) for the duration of the COVID-19 declaration under Section 564(b)(1) of the Act, 21 U.S.C. section 360bbb-3(b)(1), unless the authorization is terminated or revoked.  Performed at [facility], [address] [place], [place] [address]    *Note: Due to a large number of results and/or encounters for the requested time period, some results have not been displayed. A complete set of results can be found in Results Review.     Labs: CBC: Recent Labs  Lab 09/05/21 1324 09/06/21 0330 09/07/21 0349  WBC 6.3 3.2* 6.6  NEUTROABS 4.5 2.5  --   HGB 11.7* 12.2 12.3  HCT 35.7* 39.1 37.9  MCV 97.5 102.1* 97.2  PLT 104* 100* 749*   Basic Metabolic Panel: Recent Labs  Lab 09/05/21 1324 09/06/21 0330 09/07/21 0349 09/08/21 0339  NA 138 137 136 136  K 3.6 3.9 3.3* 4.0  CL 102 100 98 101  CO2 28 28 29 28   GLUCOSE 111* 165* 101* 92  BUN 25* 31* 47* 44*  CREATININE 1.55* 1.71* 1.73* 1.49*  CALCIUM 8.7* 8.6* 8.0* 8.6*   Liver Function Tests: Recent Labs  Lab 09/06/21 0330  AST 19  ALT 15  ALKPHOS 134*  BILITOT 0.6  PROT 7.0  ALBUMIN 3.5   CBG: No results for input(s): GLUCAP in the last 168 hours.  Discharge time spent: greater than 30 minutes.  Signed: Oswald Hillock, MD [facility] 09/08/2021

## 2021-09-12 ENCOUNTER — Other Ambulatory Visit: Payer: Self-pay | Admitting: Internal Medicine

## 2021-09-12 DIAGNOSIS — Z7901 Long term (current) use of anticoagulants: Secondary | ICD-10-CM

## 2021-10-04 ENCOUNTER — Ambulatory Visit: Payer: Medicare PPO

## 2021-10-04 ENCOUNTER — Other Ambulatory Visit: Payer: Self-pay

## 2021-10-04 DIAGNOSIS — Z7901 Long term (current) use of anticoagulants: Secondary | ICD-10-CM | POA: Diagnosis not present

## 2021-10-04 LAB — POCT INR: INR: 2.2 (ref 2.0–3.0)

## 2021-10-04 NOTE — Patient Instructions (Addendum)
Pre visit review using our clinic review tool, if applicable. No additional management support is needed unless otherwise documented below in the visit note.  Continue 1 tablet of the 2.5 mg tablet daily except take 1 1/2 tablet of the 1 mg tablet on Wednesdays.  Re-check in 5 weeks. 

## 2021-10-04 NOTE — Progress Notes (Signed)
Patient ID: Holly Simmons, female   DOB: [birth year], 81 y.o.   MRN: [id number] ?Medical screening examination/treatment/procedure(s) were performed by non-physician practitioner and as supervising physician I was immediately available for consultation/collaboration.  I agree with above. Cathlean Cower, MD ? ?

## 2021-10-04 NOTE — Progress Notes (Addendum)
Continue 1 tablet of the 2.5 mg tablet daily except take 1 1/2 tablet of the 1 mg tablet on Wednesdays.  Re-check in 5 weeks. 

## 2021-10-08 ENCOUNTER — Ambulatory Visit (INDEPENDENT_AMBULATORY_CARE_PROVIDER_SITE_OTHER): Payer: Medicare PPO

## 2021-10-08 DIAGNOSIS — I442 Atrioventricular block, complete: Secondary | ICD-10-CM

## 2021-10-08 LAB — CUP PACEART REMOTE DEVICE CHECK
Battery Impedance: 1770 Ohm
Battery Remaining Longevity: 33 mo
Battery Voltage: 2.75 V
Brady Statistic AP VP Percent: 68 %
Brady Statistic AP VS Percent: 0 %
Brady Statistic AS VP Percent: 32 %
Brady Statistic AS VS Percent: 0 %
Date Time Interrogation Session: 20230307092050
Implantable Lead Implant Date: 20131210
Implantable Lead Implant Date: 20131210
Implantable Lead Location: 753858
Implantable Lead Location: 753859
Implantable Lead Model: 4296
Implantable Lead Model: 5076
Implantable Pulse Generator Implant Date: 20131210
Lead Channel Impedance Value: 1084 Ohm
Lead Channel Impedance Value: 454 Ohm
Lead Channel Pacing Threshold Amplitude: 0.875 V
Lead Channel Pacing Threshold Amplitude: 1.75 V
Lead Channel Pacing Threshold Pulse Width: 0.4 ms
Lead Channel Pacing Threshold Pulse Width: 0.4 ms
Lead Channel Setting Pacing Amplitude: 2 V
Lead Channel Setting Pacing Amplitude: 3.75 V
Lead Channel Setting Pacing Pulse Width: 0.4 ms
Lead Channel Setting Sensing Sensitivity: 8 mV

## 2021-10-10 ENCOUNTER — Ambulatory Visit: Payer: Medicare PPO

## 2021-10-21 NOTE — Progress Notes (Signed)
Remote pacemaker transmission.   

## 2021-11-05 ENCOUNTER — Ambulatory Visit: Payer: Medicare PPO

## 2021-11-05 DIAGNOSIS — Z7901 Long term (current) use of anticoagulants: Secondary | ICD-10-CM

## 2021-11-05 LAB — POCT INR: INR: 2.1 (ref 2.0–3.0)

## 2021-11-05 NOTE — Progress Notes (Signed)
Continue 1 tablet of the 2.5 mg tablet daily except take 1 1/2 tablet of the 1 mg tablet on Wednesdays.  Re-check in 6 weeks. 

## 2021-11-05 NOTE — Patient Instructions (Addendum)
Pre visit review using our clinic review tool, if applicable. No additional management support is needed unless otherwise documented below in the visit note.  Continue 1 tablet of the 2.5 mg tablet daily except take 1 1/2 tablet of the 1 mg tablet on Wednesdays.  Re-check in 6 weeks. 

## 2021-11-07 ENCOUNTER — Other Ambulatory Visit: Payer: Self-pay | Admitting: Internal Medicine

## 2021-11-07 DIAGNOSIS — F3341 Major depressive disorder, recurrent, in partial remission: Secondary | ICD-10-CM

## 2021-11-07 DIAGNOSIS — F418 Other specified anxiety disorders: Secondary | ICD-10-CM

## 2021-11-08 ENCOUNTER — Ambulatory Visit: Payer: Medicare PPO

## 2021-11-21 ENCOUNTER — Telehealth: Payer: Medicare PPO

## 2021-11-22 ENCOUNTER — Ambulatory Visit (INDEPENDENT_AMBULATORY_CARE_PROVIDER_SITE_OTHER): Payer: Medicare PPO | Admitting: *Deleted

## 2021-11-22 DIAGNOSIS — I480 Paroxysmal atrial fibrillation: Secondary | ICD-10-CM

## 2021-11-22 DIAGNOSIS — I5032 Chronic diastolic (congestive) heart failure: Secondary | ICD-10-CM

## 2021-11-22 NOTE — Patient Instructions (Signed)
Visit Information ? ?Holly Simmons, thank you for taking time to talk with me today. Please don't hesitate to contact me if I can be of assistance to you before our next scheduled telephone appointment ? ?Below are the goals we discussed today:  ?Patient Self-Care Activities: ?Patient Holly Simmons will: ?Take medications as prescribed ?Attend all scheduled provider appointments ?Call pharmacy for medication refills ?Call provider office for new concerns or questions ?Take efforts to to follow heart healthy, low salt, low cholesterol diet ?Continue efforts to prevent falls- use your walker regularly ?Try to make sure that you are taking in enough fluid to prevent becoming dehydrated ? ?Our next scheduled telephone follow up visit/ appointment is scheduled on: Tuesday, March 04, 2022 at 9:45 am- This is a PHONE [place] appointment ? ?If you need to cancel or re-schedule our visit, please call [phone] and our care guide team will be happy to assist you. ?  ?I look forward to hearing about your progress. ?  ?Oneta Rack, RN, BSN, CCRN Alumnus ?[place] ?([phone]: direct office ? ?If you are experiencing a Mental Health or [place] or need someone to talk to, please  ?call the Suicide and Crisis Lifeline: 988 ?call the [place] National Suicide Prevention Lifeline: 443-179-3066 or TTY: 607 115 2059 TTY 702-391-8322) to talk to a trained counselor ?call 1-800-273-TALK (toll free, 24 hour hotline) ?go to [facility] [address], [place] [phone]) ?call 911  ? ?Caregiver verbalizes understanding of instructions and care plan provided today and agrees to view in [place]. Active MyChart status confirmed with caregiver  ? ?Heart Failure Action Plan ?A heart failure action plan helps you understand what to do when you have symptoms of heart failure. Your action plan is a color-coded plan that lists the symptoms to watch  for and indicates what actions to take. ?If you have symptoms in the red zone, you need medical care right away. ?If you have symptoms in the yellow zone, you are having problems. ?If you have symptoms in the green zone, you are doing well. ?Follow the plan that was created by you and your health care provider. Review your plan each time you visit your health care provider. ?Red zone ?These signs and symptoms mean you should get medical help right away: ?You have trouble breathing when resting. ?You have a dry cough that is getting worse. ?You have swelling or pain in your legs or abdomen that is getting worse. ?You suddenly gain more than 2-3 lb (0.9-1.4 kg) in 24 hours, or more than 5 lb (2.3 kg) in a week. This amount may be more or less depending on your condition. ?You have trouble staying awake or you feel confused. ?You have chest pain. ?You do not have an appetite. ?You pass out. ?You have worsening sadness or depression. ?If you have any of these symptoms, call your local emergency services (911 in the [place] right away. Do not drive yourself to the hospital. ?Yellow zone ?These signs and symptoms mean your condition may be getting worse and you should make some changes: ?You have trouble breathing when you are active, or you need to sleep with your head raised on extra pillows to help you breathe. ?You have swelling in your legs or abdomen. ?You gain 2-3 lb (0.9-1.4 kg) in 24 hours, or 5 lb (2.3 kg) in a week. This amount may be more or less depending on your condition. ?You get tired easily. ?You have  trouble sleeping. ?You have a dry cough. ?If you have any of these symptoms: ?Contact your health care provider within the next day. ?Your health care provider may adjust your medicines. ?Green zone ?These signs mean you are doing well and can continue what you are doing: ?You do not have shortness of breath. ?You have very little swelling or no new swelling. ?Your weight is stable (no gain or loss). ?You  have a normal activity level. ?You do not have chest pain or any other new symptoms. ?Follow these instructions at home: ?Take over-the-counter and prescription medicines only as told by your health care provider. ?Weigh yourself daily. Your target weight is __________ lb (__________ kg). ?Call your health care provider if you gain more than __________ lb (__________ kg) in 24 hours, or more than __________ lb (__________ kg) in a week. ?Health care provider name: _____________________________________________________ ?Health care provider phone number: _____________________________________________________ ?Eat a heart-healthy diet. Work with a diet and nutrition specialist (dietitian) to create an eating plan that is best for you. ?Keep all follow-up visits. This is important. ?Where to find more information ?American Heart Association: www.heart.org ?Summary ?A heart failure action plan helps you understand what to do when you have symptoms of heart failure. ?Follow the action plan that was created by you and your health care provider. ?Get help right away if you have any symptoms in the red zone. ?This information is not intended to replace advice given to you by your health care provider. Make sure you discuss any questions you have with your health care provider. ?Document Revised: 03/05/2020 Document Reviewed: 03/05/2020 ?Elsevier Patient Education ? [place]. ?  ? ?

## 2021-11-22 NOTE — Chronic Care Management (AMB) (Signed)
?Chronic Care Management  ? ?CCM RN Visit Note ? ?11/22/2021 ?Name: Holly Simmons MRN: [id number] DOB: 09-16-1940 ? ?Subjective: ?Holly Simmons is a 81 y.o. year old female who is a primary care patient of Janith Lima, MD. The care management team was consulted for assistance with disease management and care coordination needs.   ? ?Engaged with patient's son/ caregiver Holly Simmons, on [facility] by telephone for follow up visit in response to provider referral for case management and/or care coordination services.  ? ?Consent to Services:  ?The patient was given information about Chronic Care Management services, agreed to services, and gave verbal consent prior to initiation of services.  Please see initial visit note for detailed documentation.  ?Patient agreed to services and verbal consent obtained.  ? ?Assessment: Review of patient past medical history, allergies, medications, health status, including review of consultants reports, laboratory and other test data, was performed as part of comprehensive evaluation and provision of chronic care management services.  ? ?SDOH (Social Determinants of Health) assessments and interventions performed:  ?SDOH Interventions   ? ?Flowsheet Row Most Recent Value  ?SDOH Interventions   ?Food Insecurity Interventions Intervention Not Indicated  [caregiver/ con continues to deny food insecurity]  ?Housing Interventions Intervention Not Indicated  [caregiver son reports patient continues to reside with her daughter in single family home,  continues to deny housing concerns]  ?Transportation Interventions Intervention Not Indicated  [caregiver/ son continues to report that family provides transportation]  ? ?  ?CCM Care Plan ? ?Allergies  ?Allergen Reactions  ? Petrolatum-Zinc Oxide Anaphylaxis, Rash and Swelling  ? Sulfa Antibiotics Rash  ? Sulfonamide Derivatives Anaphylaxis and Swelling  ? Tetanus Toxoid   ?  Other reaction(s): Other (See Comments) ?OTHER REACTION   ? Tetanus Toxoids Swelling  ? Other Rash and Other (See Comments)  ?  STERI - STRIPS  ?It can affect proteins in her kidneys so she doesn't take ?REACTION: proteinuria ?Other reaction(s): Other (See Comments), Unknown ?It can affect proteins in her kidneys so she doesn't take  ? Nsaids Other (See Comments)  ?  REACTION: proteinuria ?Other reaction(s): Other (See Comments), Unknown ?It can affect proteins in her kidneys so she doesn't take ?  ? Tetanus Toxoid, Adsorbed Other (See Comments)  ?  Other reaction(s): Other (See Comments) ?Turned arm red ?Turned arm red  ? ?Outpatient Encounter Medications as of 11/22/2021  ?Medication Sig  ? allopurinol (ZYLOPRIM) 100 MG tablet TAKE 1 TABLET BY MOUTH DAILY. (Patient taking differently: Take 100 mg by mouth daily.)  ? amoxicillin (AMOXIL) 500 MG capsule TAKE 4 CAPSULES 1 HOUR PRIOR TO PROCEDURE (Patient taking differently: Take 2,000 mg by mouth See admin instructions. Take 4 capsules 1 hour prior to dental appointment)  ? Betamethasone Valerate 0.12 % foam APPLY TO SCALP AS DIRECTED. (Patient taking differently: 1 application once a week.)  ? buPROPion (WELLBUTRIN XL) 300 MG 24 hr tablet TAKE ONE TABLET BY MOUTH ONCE DAILY  ? ciclopirox (LOPROX) 0.77 % SUSP APPLY 1 PUMP TWICE A DAY. (Patient not taking: Reported on 09/05/2021)  ? dronabinol (MARINOL) 5 MG capsule TAKE ONE CAPSULE ONCE DAILY BEFORE LUNCH AND TAKE ONE CAPSULE BEFORE SUPPER (Patient taking differently: 5 mg 2 (two) times daily before lunch and supper.)  ? famotidine (PEPCID) 40 MG tablet TAKE ONE TABLET ONCE DAILY (Patient taking differently: Take 40 mg by mouth daily.)  ? ferrous sulfate 325 (65 FE) MG tablet Take 1 tablet (325 mg total) by mouth 2 (two)  times daily with a meal. (Patient taking differently: Take 325-650 mg by mouth 2 (two) times daily with a meal.)  ? fexofenadine (ALLEGRA) 180 MG tablet Take 180 mg by mouth daily as needed for allergies.  ? ipratropium (ATROVENT) 0.03 % nasal spray USE 2  SPRAYS IN EACH NOSTRIL 3 TIMES A DAY AS NEEDED. (Patient taking differently: Place 2 sprays into both nostrils daily as needed (nose bleed).)  ? ketoconazole (NIZORAL) 2 % cream APPLY TO AFFECTED AREA TWICE A DAY. (Patient not taking: Reported on 09/05/2021)  ? lamoTRIgine (LAMICTAL) 25 MG tablet TAKE ONE TABLET BY MOUTH ONCE DAILY  ? metoprolol tartrate (LOPRESSOR) 25 MG tablet TAKE 1 TABLET BY MOUTH TWICE DAILY. (Patient taking differently: Take 25 mg by mouth 2 (two) times daily.)  ? modafinil (PROVIGIL) 100 MG tablet Take 1 tablet (100 mg total) by mouth daily. (Patient taking differently: Take 100 mg by mouth daily as needed (sleep disorder).)  ? PRESCRIPTION MEDICATION 1 Dose by Other route at bedtime as needed (sleep). CPAP  ? Propylene Glycol (SYSTANE BALANCE OP) Place 1 drop into both eyes 4 (four) times daily as needed (dry eyes).  ? SYMBICORT 80-4.5 MCG/ACT inhaler USE 2 PUFFS TWICE A DAY. (Patient taking differently: 2 puffs 2 (two) times daily.)  ? torsemide (DEMADEX) 20 MG tablet TAKE 1 TABLET ONCE DAILY. (Patient taking differently: Take 20 mg by mouth daily.)  ? Vilazodone HCl (VIIBRYD) 40 MG TABS TAKE ONE TABLET ONCE DAILY (Patient taking differently: 40 mg daily.)  ? warfarin (COUMADIN) 2.5 MG tablet TAKE ONE TABLET DAILY EXCEPT ON MONDAY'S, WEDNESAY'S AND FRIDAY'S TAKE TWO TABLETS  ? [DISCONTINUED] Alum & Mag Hydroxide-Simeth (MAGIC MOUTHWASH) SOLN Take 5 mLs by mouth 3 (three) times daily. 14 days then stop   ? ?No facility-administered encounter medications on file as of 11/22/2021.  ? ?Patient Active Problem List  ? Diagnosis Date Noted  ? COVID-19 virus infection 09/05/2021  ? Chronic kidney disease, stage 3b ([place]) 09/05/2021  ? PAF (paroxysmal atrial fibrillation) ([place]) 08/31/2021  ? Thrombocytopenia ([place]) 12/04/2020  ? Easy bruising 11/22/2020  ? Encounter for general adult medical examination with abnormal findings 07/12/2020  ? Pharyngeal dysphagia 07/12/2020  ? Cachexia ([place]) 08/16/2019  ?  Iron deficiency anemia due to chronic blood loss 09/23/2018  ? CKD (chronic kidney disease) stage 5, GFR less than 15 ml/min (H[facility]) 09/12/2018  ? Microalbuminuria due to type 2 diabetes mellitus ([place]) 04/21/2018  ? Obesity (BMI 30.0-34.9) 02/05/2016  ? Osteoporosis with pathological fracture of ankle and foot 05/30/2015  ? Encounter for therapeutic drug monitoring 11/22/2014  ? Gout 08/20/2012  ? 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 06/10/2012  ? Other screening mammogram 02/04/2012  ? Hyperlipidemia with target LDL less than 100 12/04/2011  ? 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  ? 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  ? Long term (current) use of anticoagulants 07/18/2011  ? Routine general medical examination at a health care facility 05/15/2011  ? Hypertrophic obstructive cardiomyopathy ([place]) 05/22/2010  ? Hypersomnia 08/16/2009  ? Depression, major, in partial remission ([place]) 11/28/2008  ? Chronic diastolic heart failure ([place]) 08/25/2008  ? COPD (chronic obstructive pulmonary disease) with acute bronchitis ([place]) 08/10/2008  ? GERD 08/10/2008  ? Obstructive sleep apnea 08/10/2007  ? Allergic rhinitis 08/09/2007  ? ?Conditions to be addressed/monitored:  Atrial Fibrillation and CHF ? ?Care Plan : RN Care Manager Plan of Care  ?Updates made by Knox Royalty, RN since 11/22/2021 12:00 AM  ?  ? ?Problem: Chronic Disease  Management Needs   ?Priority: High  ?  ? ?Long-Range Goal: Development of Plan of Care for long term chronic disease management   ?Start Date: 07/26/2021  ?Expected End Date: 07/26/2022  ?Priority: High  ?Note:   ?Current Barriers:  ?Chronic Disease Management support and education needs related to Atrial Fibrillation and CHF ?Mild cognitive deficits/ mild dementia ?Requires assistance with ADL's/ iADL's: patient's daughter [name] lives with her; family has hired private duty caregivers Monday- Friday through [employer]" agency ?HOH: wears bilateral hearing aids ?Intermittent  decreased appetite/ swallowing difficulty: caregivers prepare food to ensure small bites, encourage proper chewing of food ? ?RNCM Clinical Goal(s):  ?Patient will demonstrate ongoing health management inde

## 2021-12-01 DIAGNOSIS — I4891 Unspecified atrial fibrillation: Secondary | ICD-10-CM | POA: Diagnosis not present

## 2021-12-01 DIAGNOSIS — I509 Heart failure, unspecified: Secondary | ICD-10-CM | POA: Diagnosis not present

## 2021-12-05 NOTE — Telephone Encounter (Signed)
Prolia VOB initiated via [email or web address] ? ?Last Prolia inj 07/18/21 ?Next prolia inj due 01/17/22 ?

## 2021-12-07 ENCOUNTER — Other Ambulatory Visit: Payer: Self-pay | Admitting: Internal Medicine

## 2021-12-07 DIAGNOSIS — Z7901 Long term (current) use of anticoagulants: Secondary | ICD-10-CM

## 2021-12-07 DIAGNOSIS — K219 Gastro-esophageal reflux disease without esophagitis: Secondary | ICD-10-CM

## 2021-12-07 DIAGNOSIS — R64 Cachexia: Secondary | ICD-10-CM

## 2021-12-21 NOTE — Telephone Encounter (Signed)
Prior auth required for PROLIA ? ?PA PROCESS DETAILS: PA is required. PA can be initiated by calling [phone] or online at ?[email or web address] ? ?

## 2021-12-24 ENCOUNTER — Ambulatory Visit: Payer: Medicare PPO

## 2021-12-24 DIAGNOSIS — Z7901 Long term (current) use of anticoagulants: Secondary | ICD-10-CM | POA: Diagnosis not present

## 2021-12-24 LAB — POCT INR: INR: 1.9 — AB (ref 2.0–3.0)

## 2021-12-24 NOTE — Progress Notes (Signed)
Increase dose today to take 1 1/2 tablets and then continue 1 tablet of the 2.5 mg tablet daily except take 1 1/2 tablet of the 1 mg tablet on Wednesdays.  Re-check in 3 weeks.

## 2021-12-24 NOTE — Patient Instructions (Addendum)
Pre visit review using our clinic review tool, if applicable. No additional management support is needed unless otherwise documented below in the visit note.  Increase dose today to take 1 1/2 tablets and then continue 1 tablet of the 2.5 mg tablet daily except take 1 1/2 tablet of the 1 mg tablet on Wednesdays.  Re-check in 3 weeks.

## 2022-01-02 NOTE — Telephone Encounter (Signed)
Pt ready for scheduling on or after 01/17/22  Out-of-pocket cost due at time of visit: $40  Primary: [employer] Prolia co-insurance: 0% Admin fee co-insurance: $40  Secondary: n/a Prolia co-insurance:  Admin fee co-insurance:   Deductible: does not apply  Prior Auth: APPROVED PA# [id number] Valid: 10/26/19-08/03/22    ** This summary of benefits is an estimation of the patient's out-of-pocket cost. Exact cost may very based on individual plan coverage.

## 2022-01-03 ENCOUNTER — Other Ambulatory Visit: Payer: Self-pay | Admitting: Internal Medicine

## 2022-01-03 DIAGNOSIS — R600 Localized edema: Secondary | ICD-10-CM

## 2022-01-03 DIAGNOSIS — I5032 Chronic diastolic (congestive) heart failure: Secondary | ICD-10-CM

## 2022-01-07 ENCOUNTER — Ambulatory Visit (INDEPENDENT_AMBULATORY_CARE_PROVIDER_SITE_OTHER): Payer: Medicare PPO

## 2022-01-07 DIAGNOSIS — I442 Atrioventricular block, complete: Secondary | ICD-10-CM | POA: Diagnosis not present

## 2022-01-07 LAB — CUP PACEART REMOTE DEVICE CHECK
Battery Impedance: 1919 Ohm
Battery Remaining Longevity: 32 mo
Battery Voltage: 2.75 V
Brady Statistic AP VP Percent: 69 %
Brady Statistic AP VS Percent: 0 %
Brady Statistic AS VP Percent: 31 %
Brady Statistic AS VS Percent: 0 %
Date Time Interrogation Session: 20230606092856
Implantable Lead Implant Date: 20131210
Implantable Lead Implant Date: 20131210
Implantable Lead Location: 753858
Implantable Lead Location: 753859
Implantable Lead Model: 4296
Implantable Lead Model: 5076
Implantable Pulse Generator Implant Date: 20131210
Lead Channel Impedance Value: 1140 Ohm
Lead Channel Impedance Value: 454 Ohm
Lead Channel Pacing Threshold Amplitude: 0.875 V
Lead Channel Pacing Threshold Amplitude: 1.75 V
Lead Channel Pacing Threshold Pulse Width: 0.4 ms
Lead Channel Pacing Threshold Pulse Width: 0.4 ms
Lead Channel Setting Pacing Amplitude: 2 V
Lead Channel Setting Pacing Amplitude: 3.5 V
Lead Channel Setting Pacing Pulse Width: 0.46 ms
Lead Channel Setting Sensing Sensitivity: 8 mV

## 2022-01-14 ENCOUNTER — Ambulatory Visit: Payer: Medicare PPO

## 2022-01-14 DIAGNOSIS — Z7901 Long term (current) use of anticoagulants: Secondary | ICD-10-CM

## 2022-01-14 LAB — POCT INR: INR: 2.7 (ref 2.0–3.0)

## 2022-01-14 NOTE — Progress Notes (Signed)
Continue 1 tablet of the 2.5 mg tablet daily except take 1 1/2 tablet of the 1 mg tablet on Wednesdays.  Re-check in 6 weeks. 

## 2022-01-14 NOTE — Patient Instructions (Addendum)
Pre visit review using our clinic review tool, if applicable. No additional management support is needed unless otherwise documented below in the visit note.  Continue 1 tablet of the 2.5 mg tablet daily except take 1 1/2 tablet of the 1 mg tablet on Wednesdays.  Re-check in 6 weeks. 

## 2022-01-20 ENCOUNTER — Ambulatory Visit: Payer: Medicare PPO | Admitting: Internal Medicine

## 2022-01-22 NOTE — Progress Notes (Signed)
Remote pacemaker transmission.   

## 2022-02-08 ENCOUNTER — Other Ambulatory Visit: Payer: Self-pay | Admitting: Internal Medicine

## 2022-02-08 DIAGNOSIS — F418 Other specified anxiety disorders: Secondary | ICD-10-CM

## 2022-02-08 DIAGNOSIS — F3341 Major depressive disorder, recurrent, in partial remission: Secondary | ICD-10-CM

## 2022-02-12 ENCOUNTER — Encounter: Payer: Self-pay | Admitting: Internal Medicine

## 2022-02-25 ENCOUNTER — Ambulatory Visit (INDEPENDENT_AMBULATORY_CARE_PROVIDER_SITE_OTHER): Payer: Medicare PPO

## 2022-02-25 DIAGNOSIS — Z7901 Long term (current) use of anticoagulants: Secondary | ICD-10-CM

## 2022-02-25 LAB — POCT INR: INR: 2.2 (ref 2.0–3.0)

## 2022-02-25 NOTE — Patient Instructions (Addendum)
Pre visit review using our clinic review tool, if applicable. No additional management support is needed unless otherwise documented below in the visit note.  Continue 1 tablet of the 2.5 mg tablet daily except take 1 1/2 tablet of the 1 mg tablet on Wednesdays.  Re-check in 5 weeks.

## 2022-02-25 NOTE — Progress Notes (Signed)
Continue 1 tablet of the 2.5 mg tablet daily except take 1 1/2 tablet of the 1 mg tablet on Wednesdays.  Re-check in 5 weeks.

## 2022-02-26 ENCOUNTER — Ambulatory Visit: Payer: Medicare PPO | Admitting: Internal Medicine

## 2022-02-26 ENCOUNTER — Other Ambulatory Visit: Payer: Self-pay

## 2022-02-26 ENCOUNTER — Encounter: Payer: Self-pay | Admitting: Internal Medicine

## 2022-02-26 VITALS — BP 126/72 | HR 69 | Temp 98.9°F | Ht 63.0 in | Wt 151.0 lb

## 2022-02-26 DIAGNOSIS — Z0001 Encounter for general adult medical examination with abnormal findings: Secondary | ICD-10-CM | POA: Diagnosis not present

## 2022-02-26 DIAGNOSIS — Z7901 Long term (current) use of anticoagulants: Secondary | ICD-10-CM

## 2022-02-26 DIAGNOSIS — I5032 Chronic diastolic (congestive) heart failure: Secondary | ICD-10-CM

## 2022-02-26 DIAGNOSIS — F332 Major depressive disorder, recurrent severe without psychotic features: Secondary | ICD-10-CM

## 2022-02-26 DIAGNOSIS — I480 Paroxysmal atrial fibrillation: Secondary | ICD-10-CM | POA: Diagnosis not present

## 2022-02-26 DIAGNOSIS — Z0000 Encounter for general adult medical examination without abnormal findings: Secondary | ICD-10-CM

## 2022-02-26 DIAGNOSIS — E785 Hyperlipidemia, unspecified: Secondary | ICD-10-CM

## 2022-02-26 DIAGNOSIS — N185 Chronic kidney disease, stage 5: Secondary | ICD-10-CM

## 2022-02-26 LAB — LIPID PANEL
Cholesterol: 171 mg/dL (ref 0–200)
HDL: 60.4 mg/dL (ref >39.00)
LDL Cholesterol: 92 mg/dL (ref 0–99)
NonHDL: 110.35
Total CHOL/HDL Ratio: 3
Triglycerides: 92 mg/dL (ref 0.0–149.0)
VLDL: 18.4 mg/dL (ref 0.0–40.0)

## 2022-02-26 LAB — TSH: TSH: 2.61 u[IU]/mL (ref 0.35–5.50)

## 2022-02-26 MED ORDER — WARFARIN SODIUM 2.5 MG PO TABS: ORAL_TABLET | ORAL | 1 refills | Status: DC

## 2022-02-26 MED ORDER — ARIPIPRAZOLE 2 MG PO TABS: 2.0000 mg | ORAL_TABLET | Freq: Every day | ORAL | 0 refills | Status: DC

## 2022-02-26 MED ORDER — WARFARIN SODIUM 1 MG PO TABS: 1.0000 mg | ORAL_TABLET | Freq: Every day | ORAL | 0 refills | Status: DC

## 2022-02-26 NOTE — Patient Instructions (Signed)

## 2022-02-26 NOTE — Progress Notes (Unsigned)
Subjective:  Patient ID: Holly Simmons, female    DOB: 1941/07/10  Age: 81 y.o. MRN: [id number]  CC: Annual Exam and Depression   HPI Holly Simmons presents for a CPX and f/up -  She continues to feel depressed with episodes of anger and crying.  She denies chest pain, shortness of breath, abdominal pain, loss of appetite, weight loss, nausea, or vomiting.  Outpatient Medications Prior to Visit  Medication Sig Dispense Refill   allopurinol (ZYLOPRIM) 100 MG tablet Take 1 tablet (100 mg total) by mouth daily. 90 tablet 1   amoxicillin (AMOXIL) 500 MG capsule TAKE 4 CAPSULES 1 HOUR PRIOR TO PROCEDURE (Patient taking differently: Take 2,000 mg by mouth See admin instructions. Take 4 capsules 1 hour prior to dental appointment) 30 capsule 0   Betamethasone Valerate 0.12 % foam APPLY TO SCALP AS DIRECTED. (Patient taking differently: 1 application  once a week.) 100 g 2   buPROPion (WELLBUTRIN XL) 300 MG 24 hr tablet TAKE ONE TABLET BY MOUTH ONCE DAILY 90 tablet 0   dronabinol (MARINOL) 5 MG capsule Take 1 capsule (5 mg total) by mouth 2 (two) times daily before lunch and supper. 180 capsule 1   famotidine (PEPCID) 40 MG tablet Take 1 tablet (40 mg total) by mouth daily. 90 tablet 1   ferrous sulfate 325 (65 FE) MG tablet Take 1 tablet (325 mg total) by mouth 2 (two) times daily with a meal. (Patient taking differently: Take 325-650 mg by mouth 2 (two) times daily with a meal.) 180 tablet 1   fexofenadine (ALLEGRA) 180 MG tablet Take 180 mg by mouth daily as needed for allergies.     ipratropium (ATROVENT) 0.03 % nasal spray USE 2 SPRAYS IN EACH NOSTRIL 3 TIMES A DAY AS NEEDED. (Patient taking differently: Place 2 sprays into both nostrils daily as needed (nose bleed).) 30 mL 0   lamoTRIgine (LAMICTAL) 25 MG tablet TAKE ONE TABLET BY MOUTH ONCE DAILY 90 tablet 0   metoprolol tartrate (LOPRESSOR) 25 MG tablet Take 1 tablet (25 mg total) by mouth 2 (two) times daily. 180 tablet 0    modafinil (PROVIGIL) 100 MG tablet Take 1 tablet (100 mg total) by mouth daily. (Patient taking differently: Take 100 mg by mouth daily as needed (sleep disorder).) 30 tablet 5   PRESCRIPTION MEDICATION 1 Dose by Other route at bedtime as needed (sleep). CPAP     Propylene Glycol (SYSTANE BALANCE OP) Place 1 drop into both eyes 4 (four) times daily as needed (dry eyes).     SYMBICORT 80-4.5 MCG/ACT inhaler USE 2 PUFFS TWICE A DAY. (Patient taking differently: 2 puffs 2 (two) times daily.) 30.6 g 5   torsemide (DEMADEX) 20 MG tablet Take 1 tablet (20 mg total) by mouth daily. 90 tablet 0   Vilazodone HCl (VIIBRYD) 40 MG TABS Take 1 tablet (40 mg total) by mouth daily. 90 tablet 1   ciclopirox (LOPROX) 0.77 % SUSP APPLY 1 PUMP TWICE A DAY. 60 mL 0   ketoconazole (NIZORAL) 2 % cream APPLY TO AFFECTED AREA TWICE A DAY. 60 g 0   warfarin (COUMADIN) 2.5 MG tablet TAKE ONE TABLET DAILY EXCEPT ON MONDAY'S, WEDNESAY'S AND FRIDAY'S TAKE TWO TABLETS 40 tablet 1   Alum & Mag Hydroxide-Simeth (MAGIC MOUTHWASH) SOLN Take 5 mLs by mouth 3 (three) times daily. 14 days then stop      No facility-administered medications prior to visit.    ROS Review of Systems  Constitutional:  Positive  for fatigue. Negative for chills, diaphoresis and fever.  HENT: Negative.    Eyes: Negative.   Respiratory:  Negative for cough, chest tightness, shortness of breath and wheezing.   Cardiovascular:  Negative for chest pain, palpitations and leg swelling.  Gastrointestinal:  Negative for abdominal pain, constipation, diarrhea and nausea.  Endocrine: Negative.   Genitourinary: Negative.  Negative for difficulty urinating, dysuria and urgency.  Musculoskeletal:  Negative for arthralgias, back pain, myalgias and neck pain.  Skin: Negative.   Neurological: Negative.  Negative for dizziness, weakness and headaches.  Hematological:  Negative for adenopathy. Does not bruise/bleed easily.  Psychiatric/Behavioral:  Positive for  dysphoric mood. Negative for confusion, decreased concentration, self-injury, sleep disturbance and suicidal ideas.     Objective:  BP 126/72 (BP Location: Right Arm, Patient Position: Sitting, Cuff Size: Large)   Pulse 69   Temp 98.9 F (37.2 C) (Oral)   Ht '5\' 3"'$  (1.6 m)   Wt 151 lb (68.5 kg)   SpO2 96%   BMI 26.75 kg/m   BP Readings from Last 3 Encounters:  02/26/22 126/72  09/08/21 126/65  08/27/21 126/62    Wt Readings from Last 3 Encounters:  02/26/22 151 lb (68.5 kg)  09/05/21 149 lb 14.6 oz (68 kg)  08/27/21 150 lb (68 kg)    Physical Exam Vitals reviewed.  HENT:     Nose: Nose normal.     Mouth/Throat:     Mouth: Mucous membranes are moist.  Eyes:     General: No scleral icterus.    Conjunctiva/sclera: Conjunctivae normal.  Cardiovascular:     Rate and Rhythm: Normal rate and regular rhythm.     Heart sounds: Murmur heard.     Systolic murmur is present with a grade of 1/6.     No gallop.  Pulmonary:     Effort: Pulmonary effort is normal.     Breath sounds: No stridor. No wheezing, rhonchi or rales.  Abdominal:     General: Abdomen is flat.     Palpations: There is no mass.     Tenderness: There is no abdominal tenderness. There is no guarding.     Hernia: No hernia is present.  Musculoskeletal:        General: Normal range of motion.     Cervical back: Neck supple.     Right lower leg: No edema.     Left lower leg: No edema.  Lymphadenopathy:     Cervical: No cervical adenopathy.  Skin:    General: Skin is warm and dry.  Neurological:     General: No focal deficit present.     Mental Status: She is alert. Mental status is at baseline.  Psychiatric:        Mood and Affect: Mood normal.        Behavior: Behavior normal.     Lab Results  Component Value Date   WBC 6.6 09/07/2021   HGB 12.3 09/07/2021   HCT 37.9 09/07/2021   PLT 140 (L) 09/07/2021   GLUCOSE 92 09/08/2021   CHOL 171 02/26/2022   TRIG 92.0 02/26/2022   HDL 60.40  02/26/2022   LDLCALC 92 02/26/2022   ALT 15 09/06/2021   AST 19 09/06/2021   NA 136 09/08/2021   K 4.0 09/08/2021   CL 101 09/08/2021   CREATININE 1.49 (H) 09/08/2021   BUN 44 (H) 09/08/2021   CO2 28 09/08/2021   TSH 2.61 02/26/2022   INR 2.2 02/25/2022   HGBA1C 5.3 03/07/2020  MICROALBUR 63.9 (H) 04/20/2018    DG Chest Port 1 View  Result Date: 09/05/2021 CLINICAL DATA:  Cough and fever for 2 days EXAM: PORTABLE CHEST 1 VIEW COMPARISON:  11/17/2017, 07/12/2020, 10/17/2011 FINDINGS: 5 mm nodular opacity projecting over the right mid lung unchanged compared with 10/17/2011. No focal consolidation. No pleural effusion or pneumothorax. Heart and mediastinal contours are unremarkable. Prior median sternotomy. Dual lead cardiac pacemaker again noted. No acute osseous abnormality. IMPRESSION: 1. No acute cardiopulmonary disease. Electronically Signed   By: Kathreen Devoid M.D.   On: 09/05/2021 13:29    Assessment & Plan:   Timarie was seen today for annual exam and depression.  Diagnoses and all orders for this visit:  Hyperlipidemia LDL goal <130 -     Lipid panel; Future -     TSH; Future -     TSH -     Lipid panel -     Pitavastatin Calcium 2 MG TABS; Take 1 tablet (2 mg total) by mouth daily.  Severe episode of recurrent major depressive disorder, without psychotic features ([place]) -     TSH; Future -     ARIPiprazole (ABILIFY) 2 MG tablet; Take 1 tablet (2 mg total) by mouth daily. -     TSH  Encounter for general adult medical examination with abnormal findings  Chronic diastolic heart failure ([facility])  PAF (paroxysmal atrial fibrillation) ([facility])  CKD (chronic kidney disease) stage 5, GFR less than 15 ml/min ([place])  Routine general medical examination at a health care facility   I have discontinued Placida L. Malecki's magic mouthwash, ketoconazole, and ciclopirox. I am also having her start on ARIPiprazole and Pitavastatin Calcium. Additionally, I am having her maintain  her fexofenadine, PRESCRIPTION MEDICATION, modafinil, ipratropium, Symbicort, ferrous sulfate, Betamethasone Valerate, amoxicillin, Propylene Glycol (SYSTANE BALANCE OP), famotidine, allopurinol, Vilazodone HCl, dronabinol, metoprolol tartrate, torsemide, buPROPion, and lamoTRIgine.  Meds ordered this encounter  Medications   ARIPiprazole (ABILIFY) 2 MG tablet    Sig: Take 1 tablet (2 mg total) by mouth daily.    Dispense:  90 tablet    Refill:  0   Pitavastatin Calcium 2 MG TABS    Sig: Take 1 tablet (2 mg total) by mouth daily.    Dispense:  90 tablet    Refill:  1     Follow-up: Return in about 6 months (around 08/29/2022).  Scarlette Calico, MD

## 2022-02-26 NOTE — Progress Notes (Signed)
Updating warfarin prescriptions with latest dosing.

## 2022-02-27 ENCOUNTER — Encounter: Payer: Self-pay | Admitting: Internal Medicine

## 2022-03-03 ENCOUNTER — Telehealth: Payer: Self-pay

## 2022-03-03 DIAGNOSIS — N184 Chronic kidney disease, stage 4 (severe): Secondary | ICD-10-CM | POA: Diagnosis not present

## 2022-03-03 MED ORDER — PITAVASTATIN CALCIUM 2 MG PO TABS: 2.0000 mg | ORAL_TABLET | Freq: Every day | ORAL | 1 refills | Status: DC

## 2022-03-03 NOTE — Telephone Encounter (Signed)
Key: [id number]

## 2022-03-04 ENCOUNTER — Ambulatory Visit: Payer: Medicare PPO | Admitting: *Deleted

## 2022-03-04 DIAGNOSIS — I5032 Chronic diastolic (congestive) heart failure: Secondary | ICD-10-CM

## 2022-03-04 DIAGNOSIS — I480 Paroxysmal atrial fibrillation: Secondary | ICD-10-CM

## 2022-03-04 NOTE — Chronic Care Management (AMB) (Signed)
Care Management    RN Visit Note  03/04/2022 Name: Holly Simmons MRN: [id number] DOB: 07-14-1941  Subjective: Holly Simmons is a 81 y.o. year old female who is a primary care patient of Janith Lima, MD. The care management team was consulted for assistance with disease management and care coordination needs.    Engaged with patient's caregiver Holly Simmons, on [facility] DPR by telephone for follow up visit/ RN CM case closure in response to provider referral for case management and/or care coordination services.   Consent to Services:   Ms. Poppell caregiver/ son Holly Simmons on [place] was given information about Care Management services 07/15/21 including:  Care Management services includes personalized support from designated clinical staff supervised by her physician, including individualized plan of care and coordination with other care providers 24/7 contact phone numbers for assistance for urgent and routine care needs. The patient may stop case management services at any time by phone call to the office staff.  Patient agreed to services and consent obtained.   Assessment: Review of patient past medical history, allergies, medications, health status, including review of consultants reports, laboratory and other test data, was performed as part of comprehensive evaluation and provision of chronic care management services.   SDOH (Social Determinants of Health) assessments and interventions performed:  SDOH Interventions    Flowsheet Row Most Recent Value  SDOH Interventions   Food Insecurity Interventions Intervention Not Indicated  [caregiver continues to deny food insecurity]  Transportation Interventions Intervention Not Indicated  [family continues to prvide transportation]     Care Plan Allergies  Allergen Reactions   Petrolatum-Zinc Oxide Anaphylaxis, Rash and Swelling   Sulfa Antibiotics Rash   Sulfonamide Derivatives Anaphylaxis and Swelling   Tetanus  Toxoid     Other reaction(s): Other (See Comments) OTHER REACTION   Tetanus Toxoids Swelling   Other Rash and Other (See Comments)    STERI - STRIPS  It can affect proteins in her kidneys so she doesn't take REACTION: proteinuria Other reaction(s): Other (See Comments), Unknown It can affect proteins in her kidneys so she doesn't take   Nsaids Other (See Comments)    REACTION: proteinuria Other reaction(s): Other (See Comments), Unknown It can affect proteins in her kidneys so she doesn't take    Tetanus Toxoid, Adsorbed Other (See Comments)    Other reaction(s): Other (See Comments) Turned arm red Turned arm red   Outpatient Encounter Medications as of 03/04/2022  Medication Sig   allopurinol (ZYLOPRIM) 100 MG tablet Take 1 tablet (100 mg total) by mouth daily.   amoxicillin (AMOXIL) 500 MG capsule TAKE 4 CAPSULES 1 HOUR PRIOR TO PROCEDURE (Patient taking differently: Take 2,000 mg by mouth See admin instructions. Take 4 capsules 1 hour prior to dental appointment)   ARIPiprazole (ABILIFY) 2 MG tablet Take 1 tablet (2 mg total) by mouth daily.   Betamethasone Valerate 0.12 % foam APPLY TO SCALP AS DIRECTED. (Patient taking differently: 1 application  once a week.)   buPROPion (WELLBUTRIN XL) 300 MG 24 hr tablet TAKE ONE TABLET BY MOUTH ONCE DAILY   dronabinol (MARINOL) 5 MG capsule Take 1 capsule (5 mg total) by mouth 2 (two) times daily before lunch and supper.   famotidine (PEPCID) 40 MG tablet Take 1 tablet (40 mg total) by mouth daily.   ferrous sulfate 325 (65 FE) MG tablet Take 1 tablet (325 mg total) by mouth 2 (two) times daily with a meal. (Patient taking differently: Take 325-650 mg by mouth  2 (two) times daily with a meal.)   fexofenadine (ALLEGRA) 180 MG tablet Take 180 mg by mouth daily as needed for allergies.   ipratropium (ATROVENT) 0.03 % nasal spray USE 2 SPRAYS IN EACH NOSTRIL 3 TIMES A DAY AS NEEDED. (Patient taking differently: Place 2 sprays into both nostrils  daily as needed (nose bleed).)   lamoTRIgine (LAMICTAL) 25 MG tablet TAKE ONE TABLET BY MOUTH ONCE DAILY   metoprolol tartrate (LOPRESSOR) 25 MG tablet Take 1 tablet (25 mg total) by mouth 2 (two) times daily.   modafinil (PROVIGIL) 100 MG tablet Take 1 tablet (100 mg total) by mouth daily. (Patient taking differently: Take 100 mg by mouth daily as needed (sleep disorder).)   Pitavastatin Calcium 2 MG TABS Take 1 tablet (2 mg total) by mouth daily.   PRESCRIPTION MEDICATION 1 Dose by Other route at bedtime as needed (sleep). CPAP   Propylene Glycol (SYSTANE BALANCE OP) Place 1 drop into both eyes 4 (four) times daily as needed (dry eyes).   SYMBICORT 80-4.5 MCG/ACT inhaler USE 2 PUFFS TWICE A DAY. (Patient taking differently: 2 puffs 2 (two) times daily.)   torsemide (DEMADEX) 20 MG tablet Take 1 tablet (20 mg total) by mouth daily.   Vilazodone HCl (VIIBRYD) 40 MG TABS Take 1 tablet (40 mg total) by mouth daily.   warfarin (COUMADIN) 1 MG tablet Take 1 tablet (1 mg total) by mouth daily. TAKE 1 1/2 TABLET EVERY WEDNESDAY OR AS DIRECTED BY ANTICOAGULATION LCINIC   warfarin (COUMADIN) 2.5 MG tablet TAKE 1 TABLET BY MOUTH DAILY EXCEPT NONE ON WEDNESDAYS OR AS DIRECTED BY ANTICOAGULATION CLINIC   No facility-administered encounter medications on file as of 03/04/2022.   Patient Active Problem List   Diagnosis Date Noted   Severe episode of recurrent major depressive disorder, without psychotic features ([place]) 02/26/2022   Chronic kidney disease, stage 3b ([place]) 09/05/2021   PAF (paroxysmal atrial fibrillation) ([place]) 08/31/2021   Thrombocytopenia ([place]) 12/04/2020   Encounter for general adult medical examination with abnormal findings 07/12/2020   Pharyngeal dysphagia 07/12/2020   Cachexia ([place]) 08/16/2019   Iron deficiency anemia due to chronic blood loss 09/23/2018   CKD (chronic kidney disease) stage 5, GFR less than 15 ml/min (HCC) 09/12/2018   Microalbuminuria due to type 2 diabetes mellitus  ([place]) 04/21/2018   Obesity (BMI 30.0-34.9) 02/05/2016   Osteoporosis with pathological fracture of ankle and foot 05/30/2015   Encounter for therapeutic drug monitoring 11/22/2014   Gout 08/20/2012   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 06/10/2012   Other screening mammogram 02/04/2012   Hyperlipidemia LDL goal <130 12/04/2011   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   Long term (current) use of anticoagulants 07/18/2011   Hypertrophic obstructive cardiomyopathy ([place]) 05/22/2010   Hypersomnia 08/16/2009   Chronic diastolic heart failure ([place]) 08/25/2008   GERD 08/10/2008   Obstructive sleep apnea 08/10/2007   Allergic rhinitis 08/09/2007   Conditions to be addressed/monitored: Atrial Fibrillation and CHF  Care Plan : RN Care Manager Plan of Care  Updates made by Knox Royalty, RN since 03/04/2022 12:00 AM     Problem: Chronic Disease Management Needs   Priority: High     Long-Range Goal: Development of Plan of Care for long term chronic disease management   Start Date: 07/26/2021  Expected End Date: 07/26/2022  Priority: High  Note:   Current Barriers:  Chronic Disease Management support and education needs related to Atrial Fibrillation and CHF Mild cognitive  deficits/ mild dementia Requires assistance with ADL's/ iADL's: patient's daughter [name] lives with her; family has hired private duty caregivers Monday- Friday through [employer]" agency HOH: wears bilateral hearing aids Intermittent decreased appetite/ swallowing difficulty: caregivers prepare food to ensure small bites, encourage proper chewing of food  RNCM Clinical Goal(s):  Patient will demonstrate ongoing health management independence as evidenced by adherence to plan of care for CHF; A-Fib        through collaboration with RN Care manager, provider, and care team.   Interventions: 1:1 collaboration with primary care provider regarding development and  update of comprehensive plan of care as evidenced by provider attestation and co-signature Inter-disciplinary care team collaboration (see longitudinal plan of care) Evaluation of current treatment plan related to  self management and patient's adherence to plan as established by provider Initial assessment completed 07/26/21 Review of patient status, including review of consultants reports, relevant laboratory and other test results, and medications completed SDOH updated: no new/ unmet concerns identified Pain assessment updated: caregiver denies that patient is in pain Falls assessment updated: continues to deny new/ recent falls x 12 months- continues using rollator walker;  positive reinforcement provided with encouragement to continue efforts at fall prevention; previously provided education around fall risks/ prevention reinforced Medications discussed: reports family continues to manage and administer medications; caregiver denies current concerns/ issues/ questions around medications; endorses adherence to taking all medications as prescribed Reviewed recent medication changes post-PCP office visit 02/26/22: caregiver reports Abilify has since been discontinued and confirms patient is not taking; has not yet obtained newly prescribed pitavastatin-- outpatient pharmacy just contacted patient yesterday-- awaiting prior authorization- caregiver was unaware that PCP ordered this medication; he reports he is waiting for follow up on medication status from insurance company Reviewed PCP office visit 02/26/22 with caregiver: he verbalizes a good understanding of post-visit instructions and denies questions; encouraged caregiver to contact PCP for any worsening of depression, given she is not taking Abilify- he verbalizes understanding and agreement Discussed previously reported issues around appetite/ swallowing: caregiver denies changes; states appetite is overall "good;" "has bad days and good days;"  again reports patient has intermittent episodes of decreased appetite that resolved spontaneously without intervention, "within a day or two" Reviewed upcoming scheduled provider appointments: 04/01/22- coumadin clinic; "eye exam scheduled sometime in November;" 09/02/21- PCP; patient confirms is aware of all and has plans to attend as scheduled Discussed plans with patient for ongoing care management follow up- patient denies current care coordination/ care management needs and is agreeable to CCM RN CM case closure today; verbalizes understanding to contact PCP or other care providers for any needs that arise in the future, and confirms he has contact information for all care providers     A-fib:  (Status: 03/04/22: Goal Met.) Long Term Goal  Reviewed importance of adherence to anticoagulant exactly as prescribed Counseled on importance of regular laboratory monitoring as prescribed Afib action plan reviewed Assessed social determinant of health barriers Confirmed that caregiver has no current clinical concerns around patient status; does not believe patient has experienced recent episodes of AF at home, states "she has had no signs or symptoms" of "being in A-Fib"  Reviewed signs/ symptoms of AF along with corresponding action plan, caregiver verbalizes good understanding of same  Heart Failure Interventions:  (Status: 03/04/22: Goal Met.)  Long Term Goal  Basic overview and discussion of pathophysiology of Heart Failure reviewed Provided education on low sodium diet Reviewed role of diuretics in prevention of fluid overload and  management of heart failure Discussed the importance of keeping all appointments with provider Confirmed patient continues taking diuretic as prescribed; reinforced previously provided education around prevention of dehydration; caregiver confirms family is "doing their best" to ensure patient's hydration status if good; positive reinforcement provided with encouragement to  continue efforts Confirmed patient unable to monitor/ record weights at home due to being a fall risk; provided education/ we discussed other signs/ symptoms CHF yellow zone to monitor daily, caregiver denies clinical concerns today around swelling, shortness of breath; discussed general action plan to contact cardiology provider for concerns around swelling/ increased shortness of breath over baseline- he verbalizes a good understanding of same and reports patient has "done well" in heat of this summer, has not had any significant episodes shortness of breath/ swelling    Plan:  No further follow up required: caregiver denies current care coordination/ care management needs and is agreeable to RN CM case closure today; RN CM case closure accordingly     Oneta Rack, RN, BSN, [place] [phone]: direct office

## 2022-03-04 NOTE — Telephone Encounter (Signed)
PA was denied

## 2022-03-10 DIAGNOSIS — N184 Chronic kidney disease, stage 4 (severe): Secondary | ICD-10-CM | POA: Diagnosis not present

## 2022-03-10 DIAGNOSIS — I129 Hypertensive chronic kidney disease with stage 1 through stage 4 chronic kidney disease, or unspecified chronic kidney disease: Secondary | ICD-10-CM | POA: Diagnosis not present

## 2022-03-10 DIAGNOSIS — N2581 Secondary hyperparathyroidism of renal origin: Secondary | ICD-10-CM | POA: Diagnosis not present

## 2022-03-10 DIAGNOSIS — D631 Anemia in chronic kidney disease: Secondary | ICD-10-CM | POA: Diagnosis not present

## 2022-03-10 DIAGNOSIS — I4891 Unspecified atrial fibrillation: Secondary | ICD-10-CM | POA: Diagnosis not present

## 2022-03-18 ENCOUNTER — Inpatient Hospital Stay (HOSPITAL_COMMUNITY): Payer: Medicare PPO

## 2022-03-18 ENCOUNTER — Emergency Department (HOSPITAL_COMMUNITY): Payer: Medicare PPO

## 2022-03-18 ENCOUNTER — Inpatient Hospital Stay (HOSPITAL_COMMUNITY)
Admission: EM | Admit: 2022-03-18 | Discharge: 2022-03-21 | DRG: 682 | Disposition: A | Discharge status: Home / Self Care | Payer: Medicare PPO | Attending: Family Medicine | Admitting: Family Medicine

## 2022-03-18 ENCOUNTER — Encounter (HOSPITAL_COMMUNITY): Payer: Self-pay | Admitting: Internal Medicine

## 2022-03-18 ENCOUNTER — Other Ambulatory Visit: Payer: Self-pay

## 2022-03-18 DIAGNOSIS — I951 Orthostatic hypotension: Secondary | ICD-10-CM

## 2022-03-18 DIAGNOSIS — Z20822 Contact with and (suspected) exposure to covid-19: Secondary | ICD-10-CM | POA: Diagnosis present

## 2022-03-18 DIAGNOSIS — N1832 Chronic kidney disease, stage 3b: Secondary | ICD-10-CM | POA: Diagnosis present

## 2022-03-18 DIAGNOSIS — D696 Thrombocytopenia, unspecified: Secondary | ICD-10-CM | POA: Diagnosis present

## 2022-03-18 DIAGNOSIS — I442 Atrioventricular block, complete: Secondary | ICD-10-CM | POA: Diagnosis not present

## 2022-03-18 DIAGNOSIS — N185 Chronic kidney disease, stage 5: Secondary | ICD-10-CM | POA: Diagnosis present

## 2022-03-18 DIAGNOSIS — E440 Moderate protein-calorie malnutrition: Secondary | ICD-10-CM | POA: Diagnosis not present

## 2022-03-18 DIAGNOSIS — R600 Localized edema: Secondary | ICD-10-CM

## 2022-03-18 DIAGNOSIS — E860 Dehydration: Secondary | ICD-10-CM | POA: Diagnosis present

## 2022-03-18 DIAGNOSIS — G9341 Metabolic encephalopathy: Secondary | ICD-10-CM | POA: Diagnosis present

## 2022-03-18 DIAGNOSIS — M109 Gout, unspecified: Secondary | ICD-10-CM | POA: Diagnosis present

## 2022-03-18 DIAGNOSIS — G4733 Obstructive sleep apnea (adult) (pediatric): Secondary | ICD-10-CM | POA: Diagnosis present

## 2022-03-18 DIAGNOSIS — Z888 Allergy status to other drugs, medicaments and biological substances status: Secondary | ICD-10-CM

## 2022-03-18 DIAGNOSIS — R4701 Aphasia: Secondary | ICD-10-CM | POA: Diagnosis present

## 2022-03-18 DIAGNOSIS — I671 Cerebral aneurysm, nonruptured: Secondary | ICD-10-CM | POA: Diagnosis not present

## 2022-03-18 DIAGNOSIS — Z8249 Family history of ischemic heart disease and other diseases of the circulatory system: Secondary | ICD-10-CM

## 2022-03-18 DIAGNOSIS — Z950 Presence of cardiac pacemaker: Secondary | ICD-10-CM | POA: Diagnosis not present

## 2022-03-18 DIAGNOSIS — N189 Chronic kidney disease, unspecified: Secondary | ICD-10-CM | POA: Diagnosis present

## 2022-03-18 DIAGNOSIS — N183 Chronic kidney disease, stage 3 unspecified: Secondary | ICD-10-CM | POA: Diagnosis not present

## 2022-03-18 DIAGNOSIS — R55 Syncope and collapse: Secondary | ICD-10-CM | POA: Diagnosis not present

## 2022-03-18 DIAGNOSIS — Z6827 Body mass index (BMI) 27.0-27.9, adult: Secondary | ICD-10-CM

## 2022-03-18 DIAGNOSIS — I5032 Chronic diastolic (congestive) heart failure: Secondary | ICD-10-CM | POA: Diagnosis present

## 2022-03-18 DIAGNOSIS — J449 Chronic obstructive pulmonary disease, unspecified: Secondary | ICD-10-CM | POA: Diagnosis present

## 2022-03-18 DIAGNOSIS — Z952 Presence of prosthetic heart valve: Secondary | ICD-10-CM | POA: Diagnosis not present

## 2022-03-18 DIAGNOSIS — I480 Paroxysmal atrial fibrillation: Secondary | ICD-10-CM | POA: Diagnosis present

## 2022-03-18 DIAGNOSIS — I421 Obstructive hypertrophic cardiomyopathy: Secondary | ICD-10-CM | POA: Diagnosis not present

## 2022-03-18 DIAGNOSIS — N179 Acute kidney failure, unspecified: Secondary | ICD-10-CM | POA: Diagnosis not present

## 2022-03-18 DIAGNOSIS — R9082 White matter disease, unspecified: Secondary | ICD-10-CM | POA: Diagnosis not present

## 2022-03-18 DIAGNOSIS — E861 Hypovolemia: Secondary | ICD-10-CM | POA: Diagnosis present

## 2022-03-18 DIAGNOSIS — R4182 Altered mental status, unspecified: Secondary | ICD-10-CM | POA: Diagnosis not present

## 2022-03-18 DIAGNOSIS — D649 Anemia, unspecified: Secondary | ICD-10-CM | POA: Diagnosis not present

## 2022-03-18 DIAGNOSIS — R29818 Other symptoms and signs involving the nervous system: Secondary | ICD-10-CM | POA: Diagnosis not present

## 2022-03-18 DIAGNOSIS — I959 Hypotension, unspecified: Secondary | ICD-10-CM | POA: Diagnosis not present

## 2022-03-18 DIAGNOSIS — E785 Hyperlipidemia, unspecified: Secondary | ICD-10-CM | POA: Diagnosis present

## 2022-03-18 DIAGNOSIS — F0393 Unspecified dementia, unspecified severity, with mood disturbance: Secondary | ICD-10-CM | POA: Diagnosis present

## 2022-03-18 DIAGNOSIS — Z7951 Long term (current) use of inhaled steroids: Secondary | ICD-10-CM

## 2022-03-18 DIAGNOSIS — Z7901 Long term (current) use of anticoagulants: Secondary | ICD-10-CM

## 2022-03-18 DIAGNOSIS — E1122 Type 2 diabetes mellitus with diabetic chronic kidney disease: Secondary | ICD-10-CM | POA: Diagnosis present

## 2022-03-18 DIAGNOSIS — R011 Cardiac murmur, unspecified: Secondary | ICD-10-CM | POA: Diagnosis not present

## 2022-03-18 DIAGNOSIS — D72819 Decreased white blood cell count, unspecified: Secondary | ICD-10-CM | POA: Diagnosis present

## 2022-03-18 DIAGNOSIS — D539 Nutritional anemia, unspecified: Secondary | ICD-10-CM | POA: Diagnosis present

## 2022-03-18 DIAGNOSIS — I130 Hypertensive heart and chronic kidney disease with heart failure and stage 1 through stage 4 chronic kidney disease, or unspecified chronic kidney disease: Secondary | ICD-10-CM | POA: Diagnosis present

## 2022-03-18 DIAGNOSIS — I63233 Cerebral infarction due to unspecified occlusion or stenosis of bilateral carotid arteries: Secondary | ICD-10-CM | POA: Diagnosis not present

## 2022-03-18 DIAGNOSIS — Z79899 Other long term (current) drug therapy: Secondary | ICD-10-CM

## 2022-03-18 DIAGNOSIS — R001 Bradycardia, unspecified: Secondary | ICD-10-CM | POA: Diagnosis not present

## 2022-03-18 DIAGNOSIS — Z91048 Other nonmedicinal substance allergy status: Secondary | ICD-10-CM

## 2022-03-18 DIAGNOSIS — I5030 Unspecified diastolic (congestive) heart failure: Secondary | ICD-10-CM | POA: Diagnosis not present

## 2022-03-18 DIAGNOSIS — K219 Gastro-esophageal reflux disease without esophagitis: Secondary | ICD-10-CM | POA: Diagnosis present

## 2022-03-18 DIAGNOSIS — R531 Weakness: Secondary | ICD-10-CM | POA: Diagnosis not present

## 2022-03-18 DIAGNOSIS — Z882 Allergy status to sulfonamides status: Secondary | ICD-10-CM

## 2022-03-18 DIAGNOSIS — I252 Old myocardial infarction: Secondary | ICD-10-CM

## 2022-03-18 LAB — I-STAT CHEM 8, ED
BUN: 31 mg/dL — ABNORMAL HIGH (ref 8–23)
Calcium, Ion: 1.19 mmol/L (ref 1.15–1.40)
Chloride: 97 mmol/L — ABNORMAL LOW (ref 98–111)
Creatinine, Ser: 2.7 mg/dL — ABNORMAL HIGH (ref 0.44–1.00)
Glucose, Bld: 112 mg/dL — ABNORMAL HIGH (ref 70–99)
HCT: 36 % (ref 36.0–46.0)
Hemoglobin: 12.2 g/dL (ref 12.0–15.0)
Potassium: 4.4 mmol/L (ref 3.5–5.1)
Sodium: 139 mmol/L (ref 135–145)
TCO2: 30 mmol/L (ref 22–32)

## 2022-03-18 LAB — DIFFERENTIAL
Abs Immature Granulocytes: 0.05 10*3/uL (ref 0.00–0.07)
Basophils Absolute: 0 10*3/uL (ref 0.0–0.1)
Basophils Relative: 1 %
Eosinophils Absolute: 0.2 10*3/uL (ref 0.0–0.5)
Eosinophils Relative: 4 %
Immature Granulocytes: 1 %
Lymphocytes Relative: 10 %
Lymphs Abs: 0.4 10*3/uL — ABNORMAL LOW (ref 0.7–4.0)
Monocytes Absolute: 0.6 10*3/uL (ref 0.1–1.0)
Monocytes Relative: 13 %
Neutro Abs: 3.3 10*3/uL (ref 1.7–7.7)
Neutrophils Relative %: 71 %

## 2022-03-18 LAB — RAPID URINE DRUG SCREEN, HOSP PERFORMED
Amphetamines: NOT DETECTED
Barbiturates: NOT DETECTED
Benzodiazepines: NOT DETECTED
Cocaine: NOT DETECTED
Opiates: NOT DETECTED
Tetrahydrocannabinol: POSITIVE — AB

## 2022-03-18 LAB — URINALYSIS, ROUTINE W REFLEX MICROSCOPIC
Bilirubin Urine: NEGATIVE
Glucose, UA: NEGATIVE mg/dL
Hgb urine dipstick: NEGATIVE
Ketones, ur: NEGATIVE mg/dL
Nitrite: NEGATIVE
Protein, ur: NEGATIVE mg/dL
Specific Gravity, Urine: 1.012 (ref 1.005–1.030)
pH: 7 (ref 5.0–8.0)

## 2022-03-18 LAB — CBC
HCT: 36.2 % (ref 36.0–46.0)
Hemoglobin: 11.5 g/dL — ABNORMAL LOW (ref 12.0–15.0)
MCH: 31.9 pg (ref 26.0–34.0)
MCHC: 31.8 g/dL (ref 30.0–36.0)
MCV: 100.6 fL — ABNORMAL HIGH (ref 80.0–100.0)
Platelets: 131 10*3/uL — ABNORMAL LOW (ref 150–400)
RBC: 3.6 MIL/uL — ABNORMAL LOW (ref 3.87–5.11)
RDW: 14.2 % (ref 11.5–15.5)
WBC: 4.6 10*3/uL (ref 4.0–10.5)
nRBC: 0 % (ref 0.0–0.2)

## 2022-03-18 LAB — COMPREHENSIVE METABOLIC PANEL
ALT: 12 U/L (ref 0–44)
AST: 16 U/L (ref 15–41)
Albumin: 3.8 g/dL (ref 3.5–5.0)
Alkaline Phosphatase: 131 U/L — ABNORMAL HIGH (ref 38–126)
Anion gap: 8 (ref 5–15)
BUN: 28 mg/dL — ABNORMAL HIGH (ref 8–23)
CO2: 30 mmol/L (ref 22–32)
Calcium: 10.1 mg/dL (ref 8.9–10.3)
Chloride: 99 mmol/L (ref 98–111)
Creatinine, Ser: 2.48 mg/dL — ABNORMAL HIGH (ref 0.44–1.00)
GFR, Estimated: 19 mL/min — ABNORMAL LOW (ref >60)
Glucose, Bld: 120 mg/dL — ABNORMAL HIGH (ref 70–99)
Potassium: 4.3 mmol/L (ref 3.5–5.1)
Sodium: 137 mmol/L (ref 135–145)
Total Bilirubin: 0.9 mg/dL (ref 0.3–1.2)
Total Protein: 6.8 g/dL (ref 6.5–8.1)

## 2022-03-18 LAB — HEMOGLOBIN A1C
Hgb A1c MFr Bld: 5.3 % (ref 4.8–5.6)
Mean Plasma Glucose: 105.41 mg/dL

## 2022-03-18 LAB — RESP PANEL BY RT-PCR (FLU A&B, COVID) ARPGX2
Influenza A by PCR: NEGATIVE
Influenza B by PCR: NEGATIVE
SARS Coronavirus 2 by RT PCR: NEGATIVE

## 2022-03-18 LAB — PROTIME-INR
INR: 2.5 — ABNORMAL HIGH (ref 0.8–1.2)
Prothrombin Time: 26.9 seconds — ABNORMAL HIGH (ref 11.4–15.2)

## 2022-03-18 LAB — APTT: aPTT: 38 seconds — ABNORMAL HIGH (ref 24–36)

## 2022-03-18 LAB — MAGNESIUM: Magnesium: 2 mg/dL (ref 1.7–2.4)

## 2022-03-18 LAB — ETHANOL: Alcohol, Ethyl (B): <10 mg/dL (ref <10)

## 2022-03-18 LAB — TROPONIN I (HIGH SENSITIVITY)
Troponin I (High Sensitivity): 15 ng/L (ref <18)
Troponin I (High Sensitivity): 13 ng/L (ref <18)

## 2022-03-18 LAB — CBG MONITORING, ED: Glucose-Capillary: 120 mg/dL — ABNORMAL HIGH (ref 70–99)

## 2022-03-18 MED ORDER — SODIUM CHLORIDE 0.9 % IV BOLUS
1000.0000 mL | Freq: Once | INTRAVENOUS | Status: AC
  Administered 2022-03-18: 1000 mL via INTRAVENOUS

## 2022-03-18 MED ORDER — IPRATROPIUM BROMIDE 0.06 % NA SOLN
2.0000 | Freq: Every day | NASAL | Status: DC | PRN
Start: 2022-03-18 — End: 2022-03-21

## 2022-03-18 MED ORDER — DRONABINOL 2.5 MG PO CAPS
5.0000 mg | ORAL_CAPSULE | Freq: Two times a day (BID) | ORAL | Status: DC
  Administered 2022-03-19 – 2022-03-21 (×4): 5 mg via ORAL
  Filled 2022-03-18 (×5): qty 2

## 2022-03-18 MED ORDER — ALLOPURINOL 100 MG PO TABS
100.0000 mg | ORAL_TABLET | Freq: Every day | ORAL | Status: DC
Start: 2022-03-19 — End: 2022-03-21
  Administered 2022-03-20 – 2022-03-21 (×2): 100 mg via ORAL
  Filled 2022-03-18 (×4): qty 1

## 2022-03-18 MED ORDER — SODIUM CHLORIDE 0.9 % IV BOLUS
500.0000 mL | Freq: Once | INTRAVENOUS | Status: AC
  Administered 2022-03-18: 500 mL via INTRAVENOUS

## 2022-03-18 MED ORDER — ALLOPURINOL 100 MG PO TABS
100.0000 mg | ORAL_TABLET | Freq: Every day | ORAL | Status: DC
  Filled 2022-03-18: qty 1

## 2022-03-18 MED ORDER — PRAVASTATIN SODIUM 10 MG PO TABS
20.0000 mg | ORAL_TABLET | Freq: Every day | ORAL | Status: DC
  Administered 2022-03-19 – 2022-03-20 (×2): 20 mg via ORAL
  Filled 2022-03-18 (×2): qty 2

## 2022-03-18 MED ORDER — WARFARIN - PHARMACIST DOSING INPATIENT: Freq: Every day | Status: DC

## 2022-03-18 MED ORDER — IOHEXOL 350 MG/ML SOLN
100.0000 mL | Freq: Once | INTRAVENOUS | Status: AC | PRN
  Administered 2022-03-18: 50 mL via INTRAVENOUS

## 2022-03-18 MED ORDER — FERROUS SULFATE 325 (65 FE) MG PO TABS
325.0000 mg | ORAL_TABLET | Freq: Two times a day (BID) | ORAL | Status: DC
  Administered 2022-03-19 – 2022-03-21 (×5): 325 mg via ORAL
  Filled 2022-03-18 (×6): qty 1

## 2022-03-18 MED ORDER — ALBUTEROL SULFATE (2.5 MG/3ML) 0.083% IN NEBU
2.5000 mg | INHALATION_SOLUTION | Freq: Four times a day (QID) | RESPIRATORY_TRACT | Status: DC
  Administered 2022-03-18 – 2022-03-19 (×2): 2.5 mg via RESPIRATORY_TRACT
  Filled 2022-03-18 (×4): qty 3

## 2022-03-18 MED ORDER — MOMETASONE FURO-FORMOTEROL FUM 100-5 MCG/ACT IN AERO
2.0000 | INHALATION_SPRAY | Freq: Two times a day (BID) | RESPIRATORY_TRACT | Status: DC
  Filled 2022-03-18 (×3): qty 8.8

## 2022-03-18 MED ORDER — POLYETHYLENE GLYCOL 3350 17 G PO PACK: 17.0000 g | PACK | Freq: Every day | ORAL | Status: DC | PRN

## 2022-03-18 MED ORDER — POLYVINYL ALCOHOL 1.4 % OP SOLN: 1.0000 [drp] | Freq: Four times a day (QID) | OPHTHALMIC | Status: DC | PRN

## 2022-03-18 MED ORDER — FAMOTIDINE 20 MG PO TABS
40.0000 mg | ORAL_TABLET | Freq: Every day | ORAL | Status: DC
  Administered 2022-03-18 – 2022-03-19 (×2): 40 mg via ORAL
  Filled 2022-03-18 (×2): qty 2

## 2022-03-18 MED ORDER — SODIUM CHLORIDE 0.9 % IV SOLN: INTRAVENOUS | Status: DC

## 2022-03-18 MED ORDER — STROKE: EARLY STAGES OF RECOVERY BOOK
Freq: Once | Status: AC
  Administered 2022-03-19: 1
  Filled 2022-03-18: qty 1

## 2022-03-18 MED ORDER — METOPROLOL TARTRATE 25 MG PO TABS
25.0000 mg | ORAL_TABLET | Freq: Two times a day (BID) | ORAL | Status: DC
  Administered 2022-03-18 – 2022-03-21 (×6): 25 mg via ORAL
  Filled 2022-03-18 (×6): qty 1

## 2022-03-18 MED ORDER — ONDANSETRON HCL 4 MG PO TABS: 4.0000 mg | ORAL_TABLET | Freq: Four times a day (QID) | ORAL | Status: DC | PRN

## 2022-03-18 MED ORDER — BUPROPION HCL ER (XL) 150 MG PO TB24
300.0000 mg | ORAL_TABLET | Freq: Every day | ORAL | Status: DC
  Administered 2022-03-19 – 2022-03-21 (×3): 300 mg via ORAL
  Filled 2022-03-18 (×2): qty 2
  Filled 2022-03-18: qty 1

## 2022-03-18 MED ORDER — ACETAMINOPHEN 325 MG PO TABS
650.0000 mg | ORAL_TABLET | Freq: Four times a day (QID) | ORAL | Status: DC | PRN
  Filled 2022-03-18: qty 2

## 2022-03-18 MED ORDER — WARFARIN SODIUM 1 MG PO TABS: 1.0000 mg | ORAL_TABLET | Freq: Every day | ORAL | Status: DC

## 2022-03-18 MED ORDER — ACETAMINOPHEN 650 MG RE SUPP: 650.0000 mg | Freq: Four times a day (QID) | RECTAL | Status: DC | PRN

## 2022-03-18 MED ORDER — WARFARIN SODIUM 2.5 MG PO TABS
2.5000 mg | ORAL_TABLET | Freq: Once | ORAL | Status: AC
Start: 2022-03-18 — End: 2022-03-18
  Administered 2022-03-18: 2.5 mg via ORAL
  Filled 2022-03-18: qty 1

## 2022-03-18 MED ORDER — LAMOTRIGINE 25 MG PO TABS
25.0000 mg | ORAL_TABLET | Freq: Every day | ORAL | Status: DC
  Administered 2022-03-18 – 2022-03-21 (×4): 25 mg via ORAL
  Filled 2022-03-18 (×4): qty 1

## 2022-03-18 MED ORDER — VILAZODONE HCL 20 MG PO TABS
40.0000 mg | ORAL_TABLET | Freq: Every day | ORAL | Status: DC
  Administered 2022-03-19 – 2022-03-21 (×3): 40 mg via ORAL
  Filled 2022-03-18 (×3): qty 2

## 2022-03-18 MED ORDER — ONDANSETRON HCL 4 MG/2ML IJ SOLN: 4.0000 mg | Freq: Four times a day (QID) | INTRAMUSCULAR | Status: DC | PRN

## 2022-03-18 NOTE — ED Notes (Signed)
Patient ambulated to the bathroom with assistance.

## 2022-03-18 NOTE — Progress Notes (Signed)
ANTICOAGULATION CONSULT NOTE - Initial Consult  Pharmacy Consult for warfarin Indication: atrial fibrillation  Allergies  Allergen Reactions   Petrolatum-Zinc Oxide Anaphylaxis, Rash and Swelling   Sulfa Antibiotics Rash   Sulfonamide Derivatives Anaphylaxis and Swelling   Tetanus Toxoid     Other reaction(s): Other (See Comments) OTHER REACTION   Tetanus Toxoids Swelling   Other Rash and Other (See Comments)    STERI - STRIPS  It can affect proteins in her kidneys so she doesn't take REACTION: proteinuria Other reaction(s): Other (See Comments), Unknown It can affect proteins in her kidneys so she doesn't take   Nsaids Other (See Comments)    REACTION: proteinuria Other reaction(s): Other (See Comments), Unknown It can affect proteins in her kidneys so she doesn't take    Tetanus Toxoid, Adsorbed Other (See Comments)    Other reaction(s): Other (See Comments) Turned arm red Turned arm red    Patient Measurements: Weight: 71 kg (156 lb 8.4 oz)  Vital Signs: Temp: 97.5 F (36.4 C) (08/15 1621) Temp Source: Oral (08/15 1621) BP: 120/49 (08/15 1715) Pulse Rate: 59 (08/15 1715)  Labs: Recent Labs    03/18/22 1555 03/18/22 1601  HGB 11.5* 12.2  HCT 36.2 36.0  PLT 131*  --   APTT 38*  --   LABPROT 26.9*  --   INR 2.5*  --   CREATININE 2.48* 2.70*  TROPONINIHS 15  --     Estimated Creatinine Clearance: 16.2 mL/min (A) (by C-G formula based on SCr of 2.7 mg/dL (H)).   Medical History: Past Medical History:  Diagnosis Date   Allergic rhinitis    Asthma    NOS w/ acute exacerbation   Blood transfusion without reported diagnosis    CKD (chronic kidney disease) stage 3, GFR 30-59 ml/min (HCC) 09/12/2018   Colon polyps    TUBULAR ADENOMAS AND HYPERPLASTIC   Complete heart block (HCC)    requiring PPM (MDT) post surgical myomectomy at [facility],  leads are epicardial with abdominal implant, high ventricular threshold at implant   COPD (chronic obstructive pulmonary  disease) (HCC)    Depressive disorder    Diastolic dysfunction    DM (diabetes mellitus) ([place])    Gallstones    GERD (gastroesophageal reflux disease)    Gout 08/20/2012   Heart murmur    Hyperpotassemia    Hypersomnia    Hypertension    Hypertrophic cardiomyopathy ([place])    s/p surgical myomectomy at [facility] 63/87 complicated by septal VSD post procedure requiring reoperation with patch closure and tricuspid valve replacement   Kidney stones    Myocardial infarction ([place]) 2011   Obstructive sleep apnea    persistent daytime sleepiness despite cpap   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   Psoriasis    Pyuria    Renal insufficiency    Tricuspid valve replaced    MDT 67m Mosaic Valve   Typical atrial flutter ([place] 9/15    Assessment: 80 YOF presenting as code stroke, hx afib on warfarin PTA, INR on admission is therapeutic at 2.5 with last dose taken at 8/14  PTA dosing: 2.'5mg'$  daily, except 1.'5mg'$  Wed  Goal of Therapy:  INR 2-3 Monitor platelets by anticoagulation protocol: Yes   Plan:  Warfarin 2.'5mg'$  PO x 1 Daily INR, s/s bleeding  JBertis Ruddy PharmD Clinical Pharmacist ED Pharmacist Phone # [phone]/15/2023 6:13 PM

## 2022-03-18 NOTE — H&P (Signed)
History and Physical  Holly Simmons RCV:[id number] DOB: Nov 13, 1940 DOA: 03/18/2022  PCP: Janith Lima, MD Patient coming from: Home  I have personally briefly reviewed patient's old medical records in [place]   Chief Complaint: generalized weakness  HPI: Holly Simmons is a 81 y.o. female past medical history of hyperlipidemia, chronic kidney disease stage IIIb, depression, chronic diastolic heart failure (hypertrophic cardiomyopathy status post myomectomy), bioprosthetic tricuspid valve, atrial fibrillation on Coumadin comes in for near syncopal episode and generalized weakness as per family with also aphasia. The patient relates she was lightheaded upon standing in the afternoon, the family members relate near syncopal episode and had to catch her as she was weak and was unable only shuffle 1 to 2 feet and had to stop.  The patient denies any asymmetric weakness, the he denies any facial droop, no falls.  She relates no changes patient and her medication, the family does relate she has had a significant decrease oral intake and has not been a great eater but mainly drinker in the last couple of weeks EMS was called code stroke was activated.  She denies any cough shortness of breath cuts or sores no sick contacts.   In the ED: Blood pressure stable as well as heart rate as a paced rhythm, has remained afebrile was found to have a creatinine of 2.7 (with a baseline of 1.4), mildly hypochloremic cardiac biomarkers are flat hemoglobin of 11 MCV of 100 platelet count of 131, INR is therapeutic TSH of 2.6 presently panels pending urinalysis is also pending alcohol level less than 10, twelve-lead EKG showed paced rhythm CT of the head showed no acute intracranial abnormalities, CT angio of the head and neck showed no acute LVO mild atherosclerosis bilaterally without stenosis and chronic ischemic changes   Review of Systems: All systems reviewed and apart from history of  presenting illness, are negative.  Past Medical History:  Diagnosis Date   Allergic rhinitis    Asthma    NOS w/ acute exacerbation   Blood transfusion without reported diagnosis    CKD (chronic kidney disease) stage 3, GFR 30-59 ml/min (HCC) 09/12/2018   Colon polyps    TUBULAR ADENOMAS AND HYPERPLASTIC   Complete heart block (HCC)    requiring PPM (MDT) post surgical myomectomy at [facility]  leads are epicardial with abdominal implant, high ventricular threshold at implant   COPD (chronic obstructive pulmonary disease) (HCC)    Depressive disorder    Diastolic dysfunction    DM (diabetes mellitus) ([place])    Gallstones    GERD (gastroesophageal reflux disease)    Gout 08/20/2012   Heart murmur    Hyperpotassemia    Hypersomnia    Hypertension    Hypertrophic cardiomyopathy ([place])    s/p surgical myomectomy at [facility] 10/25 complicated by septal VSD post procedure requiring reoperation with patch closure and tricuspid valve replacement   Kidney stones    Myocardial infarction ([place]) 2011   Obstructive sleep apnea    persistent daytime sleepiness despite cpap   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   Psoriasis    Pyuria    Renal insufficiency    Tricuspid valve replaced    MDT 95m Mosaic Valve   [place]   Past Surgical History:  Procedure Laterality Date   ABDOMINAL HYSTERECTOMY     APPENDECTOMY     BREAST CYST EXCISION     CARDIOVERSION N/A 08/01/2014   Procedure: CARDIOVERSION;  Surgeon: PThayer Headings MD;  Location: [facility];  Service: Cardiovascular;  Laterality: N/A;   [place]  [id number]   left eye lens implant   EYE SURGERY  [id number]   right eye lens implant   INSERT / REPLACE / REMOVE PACEMAKER  07/13/2012   PACEMAKER INSERTION  07/02/11   epicardial wires with abdominal implant at [facility] 12/12,  high ventricular lead threshold at implant per Dr Westley Gambles   PERMANENT PACEMAKER INSERTION N/A  07/13/2012   Procedure: PERMANENT PACEMAKER INSERTION;  Surgeon: Thompson Grayer, MD;  Location: [facility] CATH LAB;  Service: Cardiovascular;  Laterality: N/A;   POLYPECTOMY     septal myomectomy for hypertrophic CM  06/23/11   by Dr Evelina Dun at [facility], complicated by septal VSD requiring patch repair and tricuspid valve replacement   TONSILLECTOMY     TRICUSPID VALVE REPLACEMENT  11/12   Medtronic 54m Mosaic tissue valve   VSD REPAIR  06/2011   Social History:  reports that she has never smoked. She has never used smokeless tobacco. She reports that she does not drink alcohol and does not use drugs.   Allergies  Allergen Reactions   Petrolatum-Zinc Oxide Anaphylaxis, Rash and Swelling   Sulfa Antibiotics Rash   Sulfonamide Derivatives Anaphylaxis and Swelling   Tetanus Toxoid     Other reaction(s): Other (See Comments) OTHER REACTION   Tetanus Toxoids Swelling   Other Rash and Other (See Comments)    STERI - STRIPS  It can affect proteins in her kidneys so she doesn't take REACTION: proteinuria Other reaction(s): Other (See Comments), Unknown It can affect proteins in her kidneys so she doesn't take   Nsaids Other (See Comments)    REACTION: proteinuria Other reaction(s): Other (See Comments), Unknown It can affect proteins in her kidneys so she doesn't take    Tetanus Toxoid, Adsorbed Other (See Comments)    Other reaction(s): Other (See Comments) Turned arm red Turned arm red    Family History  Problem Relation Age of Onset   Hypertension Daughter    Heart disease Maternal Grandfather    Heart disease Paternal Grandfather    Bladder Cancer Father    Prostate cancer Father    Cancer Father    Varicose Veins Father    Heart attack Father    Breast cancer Mother    Dementia Mother    Hypertension Mother    Cancer Mother    Ovarian cancer Maternal Aunt    Pancreatic cancer Cousin    Breast cancer Paternal Aunt    Dementia Maternal Aunt        x 2   Dementia Maternal Uncle         x 2   Colon cancer Neg Hx    Rectal cancer Neg Hx    Stomach cancer Neg Hx    Esophageal cancer Neg Hx     Prior to Admission medications   Medication Sig Start Date End Date Taking? Authorizing Provider  allopurinol (ZYLOPRIM) 100 MG tablet Take 1 tablet (100 mg total) by mouth daily. 12/08/21   JJanith Lima MD  amoxicillin (AMOXIL) 500 MG capsule TAKE 4 CAPSULES 1 HOUR PRIOR TO PROCEDURE Patient taking differently: Take 2,000 mg by mouth See admin instructions. Take 4 capsules 1 hour prior to dental appointment 01/10/21   NJosue Hector MD  ARIPiprazole (ABILIFY) 2 MG tablet Take 1 tablet (2 mg total) by mouth daily. 02/26/22  Janith Lima, MD  Betamethasone Valerate 0.12 % foam APPLY TO SCALP AS DIRECTED. Patient taking differently: 1 application  once a week. 12/11/20   Janith Lima, MD  buPROPion (WELLBUTRIN XL) 300 MG 24 hr tablet TAKE ONE TABLET BY MOUTH ONCE DAILY 02/08/22   Janith Lima, MD  dronabinol (MARINOL) 5 MG capsule Take 1 capsule (5 mg total) by mouth 2 (two) times daily before lunch and supper. 12/08/21   Janith Lima, MD  famotidine (PEPCID) 40 MG tablet Take 1 tablet (40 mg total) by mouth daily. 12/08/21   Janith Lima, MD  ferrous sulfate 325 (65 FE) MG tablet Take 1 tablet (325 mg total) by mouth 2 (two) times daily with a meal. Patient taking differently: Take 325-650 mg by mouth 2 (two) times daily with a meal. 11/10/20   Janith Lima, MD  fexofenadine (ALLEGRA) 180 MG tablet Take 180 mg by mouth daily as needed for allergies.    [provider]  ipratropium (ATROVENT) 0.03 % nasal spray USE 2 SPRAYS IN EACH NOSTRIL 3 TIMES A DAY AS NEEDED. Patient taking differently: Place 2 sprays into both nostrils daily as needed (nose bleed). 04/29/18   [name], MD  lamoTRIgine (LAMICTAL) 25 MG tablet TAKE ONE TABLET BY MOUTH ONCE DAILY 02/08/22   Janith Lima, MD  metoprolol tartrate (LOPRESSOR) 25 MG tablet Take 1 tablet (25 mg total) by mouth  2 (two) times daily. 01/04/22   Janith Lima, MD  modafinil (PROVIGIL) 100 MG tablet Take 1 tablet (100 mg total) by mouth daily. Patient taking differently: Take 100 mg by mouth daily as needed (sleep disorder). 03/29/18   [name], MD  Pitavastatin Calcium 2 MG TABS Take 1 tablet (2 mg total) by mouth daily. 03/03/22   Janith Lima, MD  PRESCRIPTION MEDICATION 1 Dose by Other route at bedtime as needed (sleep). CPAP    [provider]  Propylene Glycol (SYSTANE BALANCE OP) Place 1 drop into both eyes 4 (four) times daily as needed (dry eyes).    [provider]  SYMBICORT 80-4.5 MCG/ACT inhaler USE 2 PUFFS TWICE A DAY. Patient taking differently: 2 puffs 2 (two) times daily. 10/11/20   Janith Lima, MD  torsemide (DEMADEX) 20 MG tablet Take 1 tablet (20 mg total) by mouth daily. 01/04/22   Janith Lima, MD  Vilazodone HCl (VIIBRYD) 40 MG TABS Take 1 tablet (40 mg total) by mouth daily. 12/08/21   Janith Lima, MD  warfarin (COUMADIN) 1 MG tablet Take 1 tablet (1 mg total) by mouth daily. TAKE 1 1/2 TABLET EVERY WEDNESDAY OR AS DIRECTED BY ANTICOAGULATION LCINIC 02/26/22   Janith Lima, MD  warfarin (COUMADIN) 2.5 MG tablet TAKE 1 TABLET BY MOUTH DAILY EXCEPT NONE ON WEDNESDAYS OR AS DIRECTED BY ANTICOAGULATION CLINIC 02/26/22   Janith Lima, MD   Physical Exam: Vitals:   03/18/22 1630 03/18/22 1645 03/18/22 1700 03/18/22 1715  BP: (!) 126/49 (!) 110/58 (!) 113/54 (!) 120/49  Pulse: (!) 58 62 60 (!) 59  Resp: '15 18 15 20  '$ Temp:      TempSrc:      SpO2: 99% 97% 96% 96%  Weight:        General exam: Moderately built and nourished patient, lying comfortably supine on the gurney in no obvious distress. Head, eyes and ENT: Nontraumatic and normocephalic. Neck: Supple. No JVD, carotid bruit or thyromegaly. Lymphatics: No lymphadenopathy. Respiratory system: Clear to  auscultation. No increased work of breathing. Cardiovascular system: S1 and S2 heard, RRR. No  JVD, murmurs, gallops, clicks or pedal edema. Gastrointestinal system: Abdomen is nondistended, soft and nontender. Normal bowel sounds heard. No organomegaly or masses appreciated. Central nervous system: She is alert and oriented x3 hard of hearing 3-12 are grossly intact sensation is intact throughout muscle strength 5 5 in all 4 extremities and symmetrical finger-to-nose is intact deep tendon reflexes are present. Extremities: No lower extremity edema Skin: No rashes or acute findings. Musculoskeletal system: Negative exam. Psychiatry: Pleasant and cooperative.   Labs on Admission:  Basic Metabolic Panel: Recent Labs  Lab 03/18/22 1555 03/18/22 1601  NA 137 139  K 4.3 4.4  CL 99 97*  CO2 30  --   GLUCOSE 120* 112*  BUN 28* 31*  CREATININE 2.48* 2.70*  CALCIUM 10.1  --    Liver Function Tests: Recent Labs  Lab 03/18/22 1555  AST 16  ALT 12  ALKPHOS 131*  BILITOT 0.9  PROT 6.8  ALBUMIN 3.8   No results for input(s): "LIPASE", "AMYLASE" in the last 168 hours. No results for input(s): "AMMONIA" in the last 168 hours. CBC: Recent Labs  Lab 03/18/22 1555 03/18/22 1601  WBC 4.6  --   NEUTROABS 3.3  --   HGB 11.5* 12.2  HCT 36.2 36.0  MCV 100.6*  --   PLT 131*  --    Cardiac Enzymes: No results for input(s): "CKTOTAL", "CKMB", "CKMBINDEX", "TROPONINI" in the last 168 hours.  BNP (last 3 results) No results for input(s): "PROBNP" in the last 8760 hours. CBG: Recent Labs  Lab 03/18/22 1555  GLUCAP 120*    Radiological Exams on Admission: CT ANGIO HEAD NECK W WO CM (CODE STROKE)  Result Date: 03/18/2022 CLINICAL DATA:  Acute neuro deficit.  Stroke EXAM: CT ANGIOGRAPHY HEAD AND NECK TECHNIQUE: Multidetector CT imaging of the head and neck was performed using the standard protocol during bolus administration of intravenous contrast. Multiplanar CT image reconstructions and MIPs were obtained to evaluate the vascular anatomy. Carotid stenosis measurements (when  applicable) are obtained utilizing NASCET criteria, using the distal internal carotid diameter as the denominator. RADIATION DOSE REDUCTION: This exam was performed according to the departmental dose-optimization program which includes automated exposure control, adjustment of the mA and/or kV according to patient size and/or use of iterative reconstruction technique. CONTRAST:  62m OMNIPAQUE IOHEXOL 350 MG/ML SOLN COMPARISON:  CT head 03/18/2022 FINDINGS: CTA NECK FINDINGS Aortic arch: Standard branching. Imaged portion shows no evidence of aneurysm or dissection. No significant stenosis of the major arch vessel origins. Right carotid system: Mild atherosclerotic disease right carotid bifurcation. No significant right carotid stenosis. Beaded appearance of the right internal carotid artery compatible with fibromuscular dysplasia. No stenosis or dissection Left carotid system: Mild atherosclerotic disease left carotid bifurcation without stenosis. Beaded appearance left internal carotid artery compatible with fibromuscular dysplasia. No dissection or stenosis. Vertebral arteries: Both vertebral arteries are patent to the skull base without stenosis. Skeleton: Cervical spondylosis. No acute skeletal abnormality. Dental caries. Other neck: Negative for mass or edema. Gas in the veins in the left neck most likely introduced at vena puncture. Upper chest: Lung apices clear bilaterally Review of the MIP images confirms the above findings CTA HEAD FINDINGS Anterior circulation: Atherosclerotic calcification in the cavernous carotid bilaterally causing mild stenosis bilaterally. Anterior middle cerebral arteries opacified normally without stenosis or large vessel occlusion. 3 mm aneurysm of the supraclinoid internal carotid artery projecting posterolaterally. Probable posterior communicating  artery aneurysm. Posterior circulation: Both vertebral arteries patent to the basilar. PICA patent bilaterally. Basilar widely  patent. Superior cerebellar and posterior cerebral arteries patent bilaterally. No stenosis or aneurysm Venous sinuses: Limited venous enhancement due to arterial phase scanning Anatomic variants: None Review of the MIP images confirms the above findings IMPRESSION: 1. No intracranial large vessel occlusion or significant stenosis 2. Mild atherosclerotic disease carotid bifurcation bilaterally without stenosis. 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 3. Both vertebral arteries widely patent 4. 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 Electronically Signed   By: Franchot Gallo M.D.   On: 03/18/2022 16:27   CT HEAD CODE STROKE WO CONTRAST  Result Date: 03/18/2022 CLINICAL DATA:  Code stroke.  Acute neuro deficit. EXAM: CT HEAD WITHOUT CONTRAST TECHNIQUE: Contiguous axial images were obtained from the base of the skull through the vertex without intravenous contrast. RADIATION DOSE REDUCTION: This exam was performed according to the departmental dose-optimization program which includes automated exposure control, adjustment of the mA and/or kV according to patient size and/or use of iterative reconstruction technique. COMPARISON:  CT head 07/12/2020 FINDINGS: Brain: Ventricle size normal. Progression of white matter hypodensity diffusely which appears chronic. Negative for acute infarct, hemorrhage, mass. Vascular: Negative for hyperdense vessel Skull: No focal abnormality. Sinuses/Orbits: Paranasal sinuses clear. Bilateral cataract extraction Other: None ASPECTS ([place] Stroke Program Early CT Score) - Ganglionic level infarction (caudate, lentiform nuclei, internal capsule, insula, M1-M3 cortex): 7 - Supraganglionic infarction (M4-M6 cortex): 3 Total score (0-10 with 10 being normal): 10 IMPRESSION: 1. No acute abnormality 2. ASPECTS is 10 3. Progression of chronic microvascular ischemia since 2021 4. Code  stroke imaging results were communicated on 03/18/2022 at 4:06 pm to provider Lorrin Goodell via text page Electronically Signed   By: Franchot Gallo M.D.   On: 03/18/2022 16:08    EKG: Independently reviewed.  Paced rhythm  Assessment/Plan Near syncope possibly due to orthostatic hypotension: She relates very nicely dizziness upon standing that started this afternoon she denies any asymmetrical weakness. There is a rise in her creatinine which is basically doubled, the family does relate she has had significant decreased oral intake over the last several weeks she is on torsemide denies any diarrhea nausea vomiting. Hold diuretic therapy we will start her on aggressive IV fluid hydration and recheck basic metabolic panel in the morning. It would also be a good idea to to get an MRI of the brain but it cannot be performed due to her pacer which is probably incompatible. Neurology was consulted by the ED we will repeat a CT of the head in the morning to rule out a stroke awaiting neurology's further recommendation.  AKI (acute kidney injury) ([place]) on  CKD (chronic kidney disease) stage 5, GFR less than 15 ml/min ([place]): She is on torsemide which we will hold we will start her on aggressive IV fluid hydration recheck basic metabolic panel in the morning likely prerenal azotemia her baseline creatinine is around 1.5 on admission is 2.7. Repeat a basic metabolic panel in the morning.  Paroxysmal atrial fibrillation: Currently rate controlled on no AV nodal blocking agents she has a paced rhythm. Continue Coumadin for goal INR 2-3.  Obstructive sleep apnea Continue CPAP.  Chronic diastolic heart failure ([place]) Actually appears hypovolemic hold diuretic therapy. Resume beta-blockers.  Hyperlipidemia LDL goal <130 Continue pravastatin's.  Moderate protein caloric malnutrition: Continue Marinol.  She is cachectic appearing. We will get a nutritional consult.  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  COPD: Noted continue inhalers.    DVT Prophylaxis: Coumadin per pharmacy Code Status: Full code Family Communication: Son and daughter-in-law Disposition Plan: Inpatient     It is my clinical opinion that admission to INPATIENT is reasonable and necessary in this 81 y.o. female past medical history of hypertrophic cardiomyopathy chronic kidney disease stage III also some degree of dementia comes in with near syncope likely due to orthostatic hypotension and acute kidney injury.  Given the aforementioned, the predictability of an adverse outcome is felt to be significant. I expect that the patient will require at least 2 midnights in the hospital to treat this condition.  Charlynne Cousins MD [facility]   03/18/2022, 6:15 PM

## 2022-03-18 NOTE — Consult Note (Signed)
Neurology Consultation  Reason for Consult: acute onset aphasia Referring Physician: Dr. Darl Householder  CC: aphasia  History is obtained from:patient and chart  HPI: Holly Simmons is a 81 y.o. female with a history of HLD, CKD, depression, CHF and  and atrial fibrillation on warfarin who presents with a near syncopal episode and acute onset aphasia.  Patient was shopping with a family member when she had a near syncopal episode but was able to catch herself by sitting on her rollator.  She then was noted to be unable to speak normally andf could not talk about what was going on.  EMS was called and she was brought to the ED. she was activated in the field as a code stroke for potential aphasia and brought in to the ED. Aphasia had notably improved by the time patient arrived to the hospital.   LKW: 1430 TNK given?: no, on warfarin with INR 2.5 IR Thrombectomy? No, no LVO Modified Rankin Scale: 1-No significant post stroke disability and can perform usual duties with stroke symptoms  ROS:  Unable to obtain due to altered mental status.   Past Medical History:  Diagnosis Date   Allergic rhinitis    Asthma    NOS w/ acute exacerbation   Blood transfusion without reported diagnosis    CKD (chronic kidney disease) stage 3, GFR 30-59 ml/min (HCC) 09/12/2018   Colon polyps    TUBULAR ADENOMAS AND HYPERPLASTIC   Complete heart block (HCC)    requiring PPM (MDT) post surgical myomectomy at [facility],  leads are epicardial with abdominal implant, high ventricular threshold at implant   COPD (chronic obstructive pulmonary disease) (HCC)    Depressive disorder    Diastolic dysfunction    DM (diabetes mellitus) ([place])    Gallstones    GERD (gastroesophageal reflux disease)    Gout 08/20/2012   Heart murmur    Hyperpotassemia    Hypersomnia    Hypertension    Hypertrophic cardiomyopathy ([place])    s/p surgical myomectomy at [facility] 07/62 complicated by septal VSD post procedure requiring reoperation with  patch closure and tricuspid valve replacement   Kidney stones    Myocardial infarction ([place]) 2011   Obstructive sleep apnea    persistent daytime sleepiness despite cpap   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   Psoriasis    Pyuria    Renal insufficiency    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     Family History  Problem Relation Age of Onset   Hypertension Daughter    Heart disease Maternal Grandfather    Heart disease Paternal Grandfather    Bladder Cancer Father    Prostate cancer Father    Cancer Father    Varicose Veins Father    Heart attack Father    Breast cancer Mother    Dementia Mother    Hypertension Mother    Cancer Mother    Ovarian cancer Maternal Aunt    Pancreatic cancer Cousin    Breast cancer Paternal Aunt    Dementia Maternal Aunt        x 2   Dementia Maternal Uncle        x 2   Colon cancer Neg Hx    Rectal cancer Neg Hx    Stomach cancer Neg Hx    Esophageal cancer Neg Hx      Social History:   reports that she has never smoked. She has never used smokeless tobacco.  She reports that she does not drink alcohol and does not use drugs.  Medications  Current Facility-Administered Medications:    sodium chloride 0.9 % bolus 1,000 mL, 1,000 mL, Intravenous, Once, Drenda Freeze, MD  Current Outpatient Medications:    allopurinol (ZYLOPRIM) 100 MG tablet, Take 1 tablet (100 mg total) by mouth daily., Disp: 90 tablet, Rfl: 1   amoxicillin (AMOXIL) 500 MG capsule, TAKE 4 CAPSULES 1 HOUR PRIOR TO PROCEDURE (Patient taking differently: Take 2,000 mg by mouth See admin instructions. Take 4 capsules 1 hour prior to dental appointment), Disp: 30 capsule, Rfl: 0   ARIPiprazole (ABILIFY) 2 MG tablet, Take 1 tablet (2 mg total) by mouth daily., Disp: 90 tablet, Rfl: 0   Betamethasone Valerate 0.12 % foam, APPLY TO SCALP AS DIRECTED. (Patient taking differently: 1 application  once a week.), Disp: 100 g, Rfl: 2    buPROPion (WELLBUTRIN XL) 300 MG 24 hr tablet, TAKE ONE TABLET BY MOUTH ONCE DAILY, Disp: 90 tablet, Rfl: 0   dronabinol (MARINOL) 5 MG capsule, Take 1 capsule (5 mg total) by mouth 2 (two) times daily before lunch and supper., Disp: 180 capsule, Rfl: 1   famotidine (PEPCID) 40 MG tablet, Take 1 tablet (40 mg total) by mouth daily., Disp: 90 tablet, Rfl: 1   ferrous sulfate 325 (65 FE) MG tablet, Take 1 tablet (325 mg total) by mouth 2 (two) times daily with a meal. (Patient taking differently: Take 325-650 mg by mouth 2 (two) times daily with a meal.), Disp: 180 tablet, Rfl: 1   fexofenadine (ALLEGRA) 180 MG tablet, Take 180 mg by mouth daily as needed for allergies., Disp: , Rfl:    ipratropium (ATROVENT) 0.03 % nasal spray, USE 2 SPRAYS IN EACH NOSTRIL 3 TIMES A DAY AS NEEDED. (Patient taking differently: Place 2 sprays into both nostrils daily as needed (nose bleed).), Disp: 30 mL, Rfl: 0   lamoTRIgine (LAMICTAL) 25 MG tablet, TAKE ONE TABLET BY MOUTH ONCE DAILY, Disp: 90 tablet, Rfl: 0   metoprolol tartrate (LOPRESSOR) 25 MG tablet, Take 1 tablet (25 mg total) by mouth 2 (two) times daily., Disp: 180 tablet, Rfl: 0   modafinil (PROVIGIL) 100 MG tablet, Take 1 tablet (100 mg total) by mouth daily. (Patient taking differently: Take 100 mg by mouth daily as needed (sleep disorder).), Disp: 30 tablet, Rfl: 5   Pitavastatin Calcium 2 MG TABS, Take 1 tablet (2 mg total) by mouth daily., Disp: 90 tablet, Rfl: 1   PRESCRIPTION MEDICATION, 1 Dose by Other route at bedtime as needed (sleep). CPAP, Disp: , Rfl:    Propylene Glycol (SYSTANE BALANCE OP), Place 1 drop into both eyes 4 (four) times daily as needed (dry eyes)., Disp: , Rfl:    SYMBICORT 80-4.5 MCG/ACT inhaler, USE 2 PUFFS TWICE A DAY. (Patient taking differently: 2 puffs 2 (two) times daily.), Disp: 30.6 g, Rfl: 5   torsemide (DEMADEX) 20 MG tablet, Take 1 tablet (20 mg total) by mouth daily., Disp: 90 tablet, Rfl: 0   Vilazodone HCl (VIIBRYD) 40  MG TABS, Take 1 tablet (40 mg total) by mouth daily., Disp: 90 tablet, Rfl: 1   warfarin (COUMADIN) 1 MG tablet, Take 1 tablet (1 mg total) by mouth daily. TAKE 1 1/2 TABLET EVERY WEDNESDAY OR AS DIRECTED BY ANTICOAGULATION LCINIC, Disp: 20 tablet, Rfl: 0   warfarin (COUMADIN) 2.5 MG tablet, TAKE 1 TABLET BY MOUTH DAILY EXCEPT NONE ON WEDNESDAYS OR AS DIRECTED BY ANTICOAGULATION CLINIC, Disp: 40 tablet, Rfl: 1  Exam: Current vital signs: BP (!) 121/49   Pulse 63   Temp (!) 97.5 F (36.4 C) (Oral)   Resp 11   Wt 71 kg   SpO2 95%   BMI 27.73 kg/m  Vital signs in last 24 hours: Temp:  [97.5 F (36.4 C)] 97.5 F (36.4 C) (08/15 1621) Pulse Rate:  [63] 63 (08/15 1621) Resp:  [11] 11 (08/15 1621) BP: (121)/(49) 121/49 (08/15 1619) SpO2:  [95 %] 95 % (08/15 1621) Weight:  [71 kg] 71 kg (08/15 1500)  GENERAL: Awake, alert, in no acute distress Psych: Affect appropriate for situation, patient is calm and cooperative with examination Head: Normocephalic and atraumatic, without obvious abnormality EENT: Normal conjunctivae, moist mucous membranes, no OP obstruction LUNGS: Normal respiratory effort. Non-labored breathing on room air Extremities: warm, well perfused, without obvious deformity  NEURO:  Mental Status: Awake, alert, and oriented to person, place, time, and situation. She is not able to provide a clear and coherent history of present illness. Speech/Language: speech is clear with some expressive aphasia.   Naming, repetition, fluency, and comprehension intact with mild expressive aphasia No neglect is noted Cranial Nerves:  II: PERRL visual fields full.  III, IV, VI: EOMI. Lid elevation symmetric and full.  V: Sensation is intact to light touch and symmetrical to face.  VII: Face is symmetric resting and smiling.  VIII: Hearing intact to voice IX, X: Phonation normal.  XII: Tongue protrudes midline without fasciculations.   Motor: 5/5 strength is all muscle groups.   Tone is normal. Bulk is normal.  Sensation: Intact to light touch bilaterally in all four extremities. No extinction to DSS present.  Coordination: FTN intact bilaterally with some difficulty following commands No pronator drift.   Gait: Deferred  NIHSS: 1a Level of Conscious.: 0 1b LOC Questions: 0 1c LOC Commands: 0 2 Best Gaze: 0 3 Visual: 0 4 Facial Palsy: 0 5a Motor Arm - left: 0 5b Motor Arm - Right: 0 6a Motor Leg - Left: 0 6b Motor Leg - Right: 0 7 Limb Ataxia: 0 8 Sensory: 0 9 Best Language: 1 10 Dysarthria: 0 11 Extinct. and Inatten.: 0 TOTAL: 1   Labs I have reviewed labs in epic and the results pertinent to this consultation are:   CBC    Component Value Date/Time   WBC 4.6 03/18/2022 1555   RBC 3.60 (L) 03/18/2022 1555   HGB 12.2 03/18/2022 1601   HCT 36.0 03/18/2022 1601   PLT 131 (L) 03/18/2022 1555   MCV 100.6 (H) 03/18/2022 1555   MCH 31.9 03/18/2022 1555   MCHC 31.8 03/18/2022 1555   RDW 14.2 03/18/2022 1555   LYMPHSABS 0.4 (L) 03/18/2022 1555   MONOABS 0.6 03/18/2022 1555   EOSABS 0.2 03/18/2022 1555   BASOSABS 0.0 03/18/2022 1555    CMP     Component Value Date/Time   NA 139 03/18/2022 1601   NA 141 08/21/2021 0000   K 4.4 03/18/2022 1601   CL 97 (L) 03/18/2022 1601   CO2 28 09/08/2021 0339   GLUCOSE 112 (H) 03/18/2022 1601   BUN 31 (H) 03/18/2022 1601   BUN 27 (A) 08/21/2021 0000   CREATININE 2.70 (H) 03/18/2022 1601   CREATININE 1.35 (H) 10/10/2011 1649   CALCIUM 8.6 (L) 09/08/2021 0339   PROT 7.0 09/06/2021 0330   ALBUMIN 3.5 09/06/2021 0330   AST 19 09/06/2021 0330   ALT 15 09/06/2021 0330   ALKPHOS 134 (H) 09/06/2021 0330   BILITOT 0.6 09/06/2021  0330   GFRNONAA 36 (L) 09/08/2021 0339   GFRAA 44 (L) 10/18/2019 2137    Lipid Panel     Component Value Date/Time   CHOL 171 02/26/2022 1619   TRIG 92.0 02/26/2022 1619   HDL 60.40 02/26/2022 1619   CHOLHDL 3 02/26/2022 1619   VLDL 18.4 02/26/2022 1619   LDLCALC 92  02/26/2022 1619   INR 2.5  Imaging I have reviewed the images obtained:  CT-scan of the brain  CTA head and neck:  No LVO or hemodynamically significant stenosis, 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  Patient's creatinine was noted to be 2.7, increased from her baseline of 1.35.  She states that she probably has not been drinking enough water over the last few days.  Patient's aphasia had improved by the time that she arrived at the hospital, but she seems slightly confused and encephalopathic.  CT head shows no ICH or other acute abnormality, and CTA shows no LVO.  Patient is not eligible for TNK due to warfarin administration with INR 2.5, and mechanical thrombectomy is not indicated as there is no LVO.  Patient will need workup for AKI which mayt have been caused by dehydration in setting of hot weather and decreased PO intake of fluids.  Impression:  Small acute ischemic stroke vs. Acute metabolic encephalopathy in setting of AKI on CKD  Recommendations: -  Admit to hospitalist service for treatment of AKI -Would prefer to get an MRI brain without contrast but if unable to due to pacemaker, would recommend getting repeat CT head without contrast in about 24 hours to rule out acute stroke.  Pt seen by NP/Neuro and later by MD. Note/plan to be edited by MD as needed.   CGrayson, MSN, AGACNP-BC [facility] See Amion for schedule and pager information 03/18/2022 4:26 PM   NEUROHOSPITALIST ADDENDUM Performed a face to face diagnostic evaluation.   I have reviewed the contents of history and physical exam as documented by PA/ARNP/Resident and agree with above documentation.  I have discussed and  formulated the above plan as documented. Edits to the note have been made as needed.  Impression/Key exam findings/Plan: She was brought in for concern for aphasia out of proportion to encephalopathy in the field and was thus activated as a code stroke.  She had a near syncopal episode while in a grocery store and her symptoms started after that.  Here, she has AKI on CKD with her creatinine of 2.7 up from her baseline around 1.4.  I suspect that her presentation is likely due to encephalopathy.  She does endorse to drinking very little fluids.  I still do have concerns for very mild aphasia out of proportion to her encephalopathy on my exam.  She is not a candidate for TNKase due to warfarin with INR of 2.5.  She is not a candidate for thrombectomy due to no LVO and her symptoms are very mild at this time.  I would still recommend getting an MRI of the brain or repeat CT head in about 24 hours just to make sure we do not missing out on a stroke.  Recs: - Would prefer to get an MRI brain without contrast but if unable to due  to pacemaker, would recommend getting repeat CT head without contrast in about 24 hours rule out acute stroke.  Donnetta Simpers, MD [facility] [phone]   If 7pm to 7am, please call on call as listed on AMION.

## 2022-03-18 NOTE — ED Triage Notes (Signed)
Pt arriving to [facility]  via [facility]. Code stroke was activated by EMS.   Patient was at [place] where she was LKW at 1430 and now complaining of syncopal episode. Patient was walking with her shopping cart when her family member noticed her to have a near syncopal episode. When she began speaking, she was noted to be aphasic.

## 2022-03-18 NOTE — ED Provider Notes (Signed)
[facility] EMERGENCY DEPARTMENT Provider Note   CSN: [id number] Arrival date & time: 03/18/22  1552  An emergency department physician performed an initial assessment on this suspected stroke patient at 1553.  History  Chief Complaint  Patient presents with   Code Stroke    Holly Simmons is a 81 y.o. female hx of afib on coumadin, hypertrophic cardiomyopathy, here with near syncope, trouble speaking.  Patient was at the store and last normal was around 2:30 PM.  Patient was with family and suddenly had a near syncopal event.  Patient then woke up from it and had some trouble speaking.  Code stroke was activated by EMS.   The history is provided by the patient.       Home Medications Prior to Admission medications   Medication Sig Start Date End Date Taking? Authorizing Provider  allopurinol (ZYLOPRIM) 100 MG tablet Take 1 tablet (100 mg total) by mouth daily. 12/08/21   Janith Lima, MD  amoxicillin (AMOXIL) 500 MG capsule TAKE 4 CAPSULES 1 HOUR PRIOR TO PROCEDURE Patient taking differently: Take 2,000 mg by mouth See admin instructions. Take 4 capsules 1 hour prior to dental appointment 01/10/21   Josue Hector, MD  ARIPiprazole (ABILIFY) 2 MG tablet Take 1 tablet (2 mg total) by mouth daily. 02/26/22   Janith Lima, MD  Betamethasone Valerate 0.12 % foam APPLY TO SCALP AS DIRECTED. Patient taking differently: 1 application  once a week. 12/11/20   Janith Lima, MD  buPROPion (WELLBUTRIN XL) 300 MG 24 hr tablet TAKE ONE TABLET BY MOUTH ONCE DAILY 02/08/22   Janith Lima, MD  dronabinol (MARINOL) 5 MG capsule Take 1 capsule (5 mg total) by mouth 2 (two) times daily before lunch and supper. 12/08/21   Janith Lima, MD  famotidine (PEPCID) 40 MG tablet Take 1 tablet (40 mg total) by mouth daily. 12/08/21   Janith Lima, MD  ferrous sulfate 325 (65 FE) MG tablet Take 1 tablet (325 mg total) by mouth 2 (two) times daily with a meal. Patient taking  differently: Take 325-650 mg by mouth 2 (two) times daily with a meal. 11/10/20   Janith Lima, MD  fexofenadine (ALLEGRA) 180 MG tablet Take 180 mg by mouth daily as needed for allergies.    [provider]  ipratropium (ATROVENT) 0.03 % nasal spray USE 2 SPRAYS IN EACH NOSTRIL 3 TIMES A DAY AS NEEDED. Patient taking differently: Place 2 sprays into both nostrils daily as needed (nose bleed). 04/29/18   [name], MD  lamoTRIgine (LAMICTAL) 25 MG tablet TAKE ONE TABLET BY MOUTH ONCE DAILY 02/08/22   Janith Lima, MD  metoprolol tartrate (LOPRESSOR) 25 MG tablet Take 1 tablet (25 mg total) by mouth 2 (two) times daily. 01/04/22   Janith Lima, MD  modafinil (PROVIGIL) 100 MG tablet Take 1 tablet (100 mg total) by mouth daily. Patient taking differently: Take 100 mg by mouth daily as needed (sleep disorder). 03/29/18   [name], MD  Pitavastatin Calcium 2 MG TABS Take 1 tablet (2 mg total) by mouth daily. 03/03/22   Janith Lima, MD  PRESCRIPTION MEDICATION 1 Dose by Other route at bedtime as needed (sleep). CPAP    [provider]  Propylene Glycol (SYSTANE BALANCE OP) Place 1 drop into both eyes 4 (four) times daily as needed (dry eyes).    [provider]  SYMBICORT 80-4.5 MCG/ACT inhaler USE 2 PUFFS TWICE A DAY.  Patient taking differently: 2 puffs 2 (two) times daily. 10/11/20   Janith Lima, MD  torsemide (DEMADEX) 20 MG tablet Take 1 tablet (20 mg total) by mouth daily. 01/04/22   Janith Lima, MD  Vilazodone HCl (VIIBRYD) 40 MG TABS Take 1 tablet (40 mg total) by mouth daily. 12/08/21   Janith Lima, MD  warfarin (COUMADIN) 1 MG tablet Take 1 tablet (1 mg total) by mouth daily. TAKE 1 1/2 TABLET EVERY WEDNESDAY OR AS DIRECTED BY ANTICOAGULATION [facility]IN[facility] 02/26/22   Janith Lima, MD  warfarin (COUMADIN) 2.5 MG tablet TAKE 1 TABLET BY MOUTH DAILY EXCEPT NONE ON WEDNESDAYS OR AS DIRECTED BY ANTICOAGULATION CLINIC 02/26/22   Janith Lima, MD       Allergies    Petrolatum-zinc oxide; Sulfa antibiotics; Sulfonamide derivatives; Tetanus toxoid; Tetanus toxoids; Other; Nsaids; and Tetanus toxoid, adsorbed    Review of Systems   Review of Systems  Neurological:  Positive for syncope and speech difficulty.  All other systems reviewed and are negative.   Physical Exam Updated Vital Signs BP (!) 113/54   Pulse 60   Temp (!) 97.5 F (36.4 C) (Oral)   Resp 15   Wt 71 kg   SpO2 96%   BMI 27.73 kg/m  Physical Exam Vitals and nursing note reviewed.  Constitutional:      Appearance: Normal appearance.  HENT:     Head: Normocephalic.     Nose: Nose normal.     Mouth/Throat:     Mouth: Mucous membranes are moist.  Eyes:     Extraocular Movements: Extraocular movements intact.     Pupils: Pupils are equal, round, and reactive to light.  Cardiovascular:     Rate and Rhythm: Normal rate and regular rhythm.     Pulses: Normal pulses.     Heart sounds: Normal heart sounds.  Pulmonary:     Effort: Pulmonary effort is normal.     Breath sounds: Normal breath sounds.  Abdominal:     General: Abdomen is flat.     Palpations: Abdomen is soft.  Musculoskeletal:        General: Normal range of motion.     Cervical back: Normal range of motion and neck supple.  Skin:    General: Skin is warm.     Capillary Refill: Capillary refill takes less than 2 seconds.  Neurological:     General: No focal deficit present.     Mental Status: She is alert and oriented to person, place, and time.     Comments: No obvious facial droop.  Patient has normal strength bilateral arms and legs.  No obvious aphasia.  Psychiatric:        Mood and Affect: Mood normal.        Behavior: Behavior normal.     ED Results / Procedures / Treatments   Labs (all labs ordered are listed, but only abnormal results are displayed) Labs Reviewed  PROTIME-INR - Abnormal; Notable for the following components:      Result Value   Prothrombin Time 26.9 (*)     INR 2.5 (*)    All other components within normal limits  APTT - Abnormal; Notable for the following components:   aPTT 38 (*)    All other components within normal limits  CBC - Abnormal; Notable for the following components:   RBC 3.60 (*)    Hemoglobin 11.5 (*)    MCV 100.6 (*)    Platelets 131 (*)  All other components within normal limits  DIFFERENTIAL - Abnormal; Notable for the following components:   Lymphs Abs 0.4 (*)    All other components within normal limits  COMPREHENSIVE METABOLIC PANEL - Abnormal; Notable for the following components:   Glucose, Bld 120 (*)    BUN 28 (*)    Creatinine, Ser 2.48 (*)    Alkaline Phosphatase 131 (*)    GFR, Estimated 19 (*)    All other components within normal limits  I-STAT CHEM 8, ED - Abnormal; Notable for the following components:   Chloride 97 (*)    BUN 31 (*)    Creatinine, Ser 2.70 (*)    Glucose, Bld 112 (*)    All other components within normal limits  CBG MONITORING, ED - Abnormal; Notable for the following components:   Glucose-Capillary 120 (*)    All other components within normal limits  RESP PANEL BY RT-PCR (FLU A&B, COVID) ARPGX2  ETHANOL  RAPID URINE DRUG SCREEN, HOSP PERFORMED  URINALYSIS, ROUTINE W REFLEX MICROSCOPIC  TROPONIN I (HIGH SENSITIVITY)    EKG EKG Interpretation  Date/Time:  Tuesday March 18 2022 16:20:27 EDT Ventricular Rate:  63 PR Interval:  161 QRS Duration: 231 QT Interval:  507 QTC Calculation: 520 R Axis:   147 Text Interpretation: paced Consider dextrocardia No significant change since last tracing Confirmed by Wandra Arthurs [phone]) on 03/18/2022 4:49:34 PM  Radiology CT ANGIO HEAD NECK W WO CM (CODE STROKE)  Result Date: 03/18/2022 CLINICAL DATA:  Acute neuro deficit.  Stroke EXAM: CT ANGIOGRAPHY HEAD AND NECK TECHNIQUE: Multidetector CT imaging of the head and neck was performed using the standard protocol during bolus administration of intravenous contrast. Multiplanar CT  image reconstructions and MIPs were obtained to evaluate the vascular anatomy. Carotid stenosis measurements (when applicable) are obtained utilizing NASCET criteria, using the distal internal carotid diameter as the denominator. RADIATION DOSE REDUCTION: This exam was performed according to the departmental dose-optimization program which includes automated exposure control, adjustment of the mA and/or kV according to patient size and/or use of iterative reconstruction technique. CONTRAST:  53m OMNIPAQUE IOHEXOL 350 MG/ML SOLN COMPARISON:  CT head 03/18/2022 FINDINGS: CTA NECK FINDINGS Aortic arch: Standard branching. Imaged portion shows no evidence of aneurysm or dissection. No significant stenosis of the major arch vessel origins. Right carotid system: Mild atherosclerotic disease right carotid bifurcation. No significant right carotid stenosis. Beaded appearance of the right internal carotid artery compatible with fibromuscular dysplasia. No stenosis or dissection Left carotid system: Mild atherosclerotic disease left carotid bifurcation without stenosis. Beaded appearance left internal carotid artery compatible with fibromuscular dysplasia. No dissection or stenosis. Vertebral arteries: Both vertebral arteries are patent to the skull base without stenosis. Skeleton: Cervical spondylosis. No acute skeletal abnormality. Dental caries. Other neck: Negative for mass or edema. Gas in the veins in the left neck most likely introduced at vena puncture. Upper chest: Lung apices clear bilaterally Review of the MIP images confirms the above findings CTA HEAD FINDINGS Anterior circulation: Atherosclerotic calcification in the cavernous carotid bilaterally causing mild stenosis bilaterally. Anterior middle cerebral arteries opacified normally without stenosis or large vessel occlusion. 3 mm aneurysm of the supraclinoid internal carotid artery projecting posterolaterally. Probable posterior communicating artery aneurysm.  Posterior circulation: Both vertebral arteries patent to the basilar. PICA patent bilaterally. Basilar widely patent. Superior cerebellar and posterior cerebral arteries patent bilaterally. No stenosis or aneurysm Venous sinuses: Limited venous enhancement due to arterial phase scanning Anatomic variants: None Review of the MIP  images confirms the above findings IMPRESSION: 1. No intracranial large vessel occlusion or significant stenosis 2. Mild atherosclerotic disease carotid bifurcation bilaterally without stenosis. 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 3. Both vertebral arteries widely patent 4. 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 Electronically Signed   By: Franchot Gallo M.D.   On: 03/18/2022 16:27   CT HEAD CODE STROKE WO CONTRAST  Result Date: 03/18/2022 CLINICAL DATA:  Code stroke.  Acute neuro deficit. EXAM: CT HEAD WITHOUT CONTRAST TECHNIQUE: Contiguous axial images were obtained from the base of the skull through the vertex without intravenous contrast. RADIATION DOSE REDUCTION: This exam was performed according to the departmental dose-optimization program which includes automated exposure control, adjustment of the mA and/or kV according to patient size and/or use of iterative reconstruction technique. COMPARISON:  CT head 07/12/2020 FINDINGS: Brain: Ventricle size normal. Progression of white matter hypodensity diffusely which appears chronic. Negative for acute infarct, hemorrhage, mass. Vascular: Negative for hyperdense vessel Skull: No focal abnormality. Sinuses/Orbits: Paranasal sinuses clear. Bilateral cataract extraction Other: None ASPECTS ([place] Stroke Program Early CT Score) - Ganglionic level infarction (caudate, lentiform nuclei, internal capsule, insula, M1-M3 cortex): 7 - Supraganglionic infarction (M4-M6 cortex): 3 Total score (0-10 with 10 being normal): 10 IMPRESSION: 1.  No acute abnormality 2. ASPECTS is 10 3. Progression of chronic microvascular ischemia since 2021 4. Code stroke imaging results were communicated on 03/18/2022 at 4:06 pm to provider Lorrin Goodell via text page Electronically Signed   By: Franchot Gallo M.D.   On: 03/18/2022 16:08    Procedures Procedures    Medications Ordered in ED Medications  sodium chloride 0.9 % bolus 1,000 mL (has no administration in time range)  iohexol (OMNIPAQUE) 350 MG/ML injection 100 mL (50 mLs Intravenous Contrast Given 03/18/22 1613)    ED Course/ Medical Decision Making/ A&P                           Medical Decision Making ANGLA DELAHUNT is a 81 y.o. female here with near syncope and trouble speaking.  Patient has no aphasia on my exam.  Patient has no facial droop or focal neurodeficit.  Code stroke activated since patient had some mild aphasia initially.  Dr. Regis Bill from neurology at bedside.  He will order CTA head.  Patient is on Coumadin so is not a tPA candidate.  5:22 PM Patient is doing better.  Patient's labs showed AKI.  Creatinine is 2.7.  I discussed with family at bedside now.  They state that she is not been eating and drinking much for the last 4 to 5 days.  Patient has no diarrhea or vomiting.  CTA was reviewed and showed no obvious LVO.  Neurology does not recommend any IR tPA at this point.  They recommend MRI brain and if it is negative, patient does not need further work-up from neuro standpoint.  Patient will be admitted for acute renal failure and syncope   Problems Addressed: AKI (acute kidney injury) [facility]): acute illness or injury Near syncope: acute illness or injury  Amount and/or Complexity of Data Reviewed Labs: ordered. Decision-making details documented in ED Course. Radiology: ordered and independent interpretation performed. ECG/medicine tests: ordered and independent interpretation performed. Decision-making details documented in ED Course.  Risk Decision  regarding hospitalization.   Final Clinical Impression(s) / ED Diagnoses Final diagnoses:  AKI (acute kidney injury) ([place])  Near syncope  Rx / DC Orders ED Discharge Orders     None         Drenda Freeze, MD 03/18/22 1723

## 2022-03-18 NOTE — Code Documentation (Addendum)
Stroke Response Nurse Documentation Code Documentation  Holly Simmons is a 81 y.o. female arriving to [facility]  via [facility] on 03/18/2022 with past medical hx of asthma, COPD, OSA, complete heart block s/p PPM, diabetes mellitus, HTN, CKD. On warfarin daily. Code stroke was activated by EMS.   Patient was at [place] where she was LKW at 1430 and now complaining of syncopal episode. Patient was walking with her shopping cart when her family member noticed her to have a near syncopal episode. When she began speaking, she was noted to be aphasic.   Stroke team at the bedside on patient arrival. Labs drawn and patient cleared for CT by Dr. Darl Householder. Patient to CT with team. NIHSS 4, see documentation for details and code stroke times. Patient with disoriented, bilateral limb ataxia, and expressive aphasia on exam. The following imaging was completed:  CT Head and CTA. Patient is not a candidate for IV Thrombolytic due to being on Warfarin. Patient is not a candidate for IR due to LVO negative.   Care Plan: q2h NIHSS and VS x 12 hours, then q4h NIHSS and VS  Bedside handoff with ED RN Chloe.    Luciann Gossett L Kimaria Struthers  Rapid Response RN

## 2022-03-19 ENCOUNTER — Inpatient Hospital Stay (HOSPITAL_COMMUNITY): Payer: Medicare PPO

## 2022-03-19 DIAGNOSIS — G4733 Obstructive sleep apnea (adult) (pediatric): Secondary | ICD-10-CM

## 2022-03-19 DIAGNOSIS — R011 Cardiac murmur, unspecified: Secondary | ICD-10-CM

## 2022-03-19 DIAGNOSIS — E785 Hyperlipidemia, unspecified: Secondary | ICD-10-CM | POA: Diagnosis not present

## 2022-03-19 DIAGNOSIS — N185 Chronic kidney disease, stage 5: Secondary | ICD-10-CM | POA: Diagnosis not present

## 2022-03-19 DIAGNOSIS — I5032 Chronic diastolic (congestive) heart failure: Secondary | ICD-10-CM | POA: Diagnosis not present

## 2022-03-19 DIAGNOSIS — R55 Syncope and collapse: Secondary | ICD-10-CM | POA: Diagnosis not present

## 2022-03-19 LAB — LIPID PANEL
Cholesterol: 129 mg/dL (ref 0–200)
HDL: 47 mg/dL (ref >40)
LDL Cholesterol: 74 mg/dL (ref 0–99)
Total CHOL/HDL Ratio: 2.7 RATIO
Triglycerides: 40 mg/dL (ref <150)
VLDL: 8 mg/dL (ref 0–40)

## 2022-03-19 LAB — BASIC METABOLIC PANEL
Anion gap: 7 (ref 5–15)
BUN: 27 mg/dL — ABNORMAL HIGH (ref 8–23)
CO2: 26 mmol/L (ref 22–32)
Calcium: 9.3 mg/dL (ref 8.9–10.3)
Chloride: 105 mmol/L (ref 98–111)
Creatinine, Ser: 2.13 mg/dL — ABNORMAL HIGH (ref 0.44–1.00)
GFR, Estimated: 23 mL/min — ABNORMAL LOW (ref >60)
Glucose, Bld: 84 mg/dL (ref 70–99)
Potassium: 4 mmol/L (ref 3.5–5.1)
Sodium: 138 mmol/L (ref 135–145)

## 2022-03-19 LAB — CBC
HCT: 31.1 % — ABNORMAL LOW (ref 36.0–46.0)
Hemoglobin: 10.1 g/dL — ABNORMAL LOW (ref 12.0–15.0)
MCH: 32.8 pg (ref 26.0–34.0)
MCHC: 32.5 g/dL (ref 30.0–36.0)
MCV: 101 fL — ABNORMAL HIGH (ref 80.0–100.0)
Platelets: 102 10*3/uL — ABNORMAL LOW (ref 150–400)
RBC: 3.08 MIL/uL — ABNORMAL LOW (ref 3.87–5.11)
RDW: 14.2 % (ref 11.5–15.5)
WBC: 3.4 10*3/uL — ABNORMAL LOW (ref 4.0–10.5)
nRBC: 0 % (ref 0.0–0.2)

## 2022-03-19 LAB — ECHOCARDIOGRAM COMPLETE
AR max vel: 3.09 cm2
AV Area VTI: 2.87 cm2
AV Area mean vel: 2.89 cm2
AV Mean grad: 9 mmHg
AV Peak grad: 17.4 mmHg
Ao pk vel: 2.09 m/s
Area-P 1/2: 3.34 cm2
P 1/2 time: 377 msec
S' Lateral: 3.8 cm
Weight: 2504.43 oz

## 2022-03-19 LAB — PROTIME-INR
INR: 2.8 — ABNORMAL HIGH (ref 0.8–1.2)
Prothrombin Time: 28.8 seconds — ABNORMAL HIGH (ref 11.4–15.2)

## 2022-03-19 MED ORDER — LACTATED RINGERS IV SOLN: INTRAVENOUS | Status: DC

## 2022-03-19 MED ORDER — ALBUTEROL SULFATE (2.5 MG/3ML) 0.083% IN NEBU: 2.5000 mg | INHALATION_SOLUTION | RESPIRATORY_TRACT | Status: DC | PRN

## 2022-03-19 MED ORDER — FAMOTIDINE 20 MG PO TABS
10.0000 mg | ORAL_TABLET | Freq: Every day | ORAL | Status: DC
  Administered 2022-03-20 – 2022-03-21 (×2): 10 mg via ORAL
  Filled 2022-03-19 (×2): qty 1

## 2022-03-19 MED ORDER — WARFARIN SODIUM 1 MG PO TABS
1.5000 mg | ORAL_TABLET | Freq: Once | ORAL | Status: AC
  Administered 2022-03-19: 1.5 mg via ORAL
  Filled 2022-03-19: qty 1

## 2022-03-19 NOTE — Evaluation (Signed)
Physical Therapy Evaluation Patient Details Name: Holly Simmons MRN: [id number] DOB: [birth year] Today's Date: 03/19/2022  History of Present Illness  Pt is 81 yo female who presented on 03/18/22 with aphasia, weakness, and near syncope in setting of decreased oral intake.  CT head negative, neuro consulted and felt low suspicion for stroke and more likely encephalopathy.  Pt with AKI on stage IV CKD.  Pt with hx of CKD, HF, cardiomyopathy, pacemaker, afib, HLD, and cognitive impairment without formal diagnosis  Clinical Impression   Pt admitted with above diagnosis.  At baseline, pt has 24 hr support due to cognitive deficits and requiring min A at times.  Today, pt demonstrating good strength and coordination (equal bilaterally) , orthostatic blood pressures were stable, good balance with rollator (baseline), and ambulated 250' with supervision using rollator.  Son and daughter present and agree that mobility appears back to baseline -They feel that it is only cognition that is not back to normal. No further acute PT needed - family agrees. Recommend ambulation with nursing or mobility team.     Recommendations for follow up therapy are one component of a multi-disciplinary discharge planning process, led by the attending physician.  Recommendations may be updated based on patient status, additional functional criteria and insurance authorization.  Follow Up Recommendations No PT follow up      Assistance Recommended at Discharge Frequent or constant Supervision/Assistance  Patient can return home with the following  Direct supervision/assist for medications management;Direct supervision/assist for financial management;Help with stairs or ramp for entrance;A little help with walking and/or transfers;A little help with bathing/dressing/bathroom    Equipment Recommendations None recommended by PT  Recommendations for Other Services       Functional Status Assessment Patient has not  had a recent decline in their functional status     Precautions / Restrictions Precautions Precautions: Fall      Mobility  Bed Mobility Overal bed mobility: Needs Assistance Bed Mobility: Supine to Sit, Sit to Supine     Supine to sit: Supervision Sit to supine: Supervision        Transfers Overall transfer level: Needs assistance Equipment used: Rollator (4 wheels), None Transfers: Sit to/from Stand Sit to Stand: Min guard, Supervision           General transfer comment: performed x 2 ; min guard progresed to supervision    Ambulation/Gait Ambulation/Gait assistance: Min guard, Supervision Gait Distance (Feet): 250 Feet Assistive device: Rollator (4 wheels) Gait Pattern/deviations: Step-through pattern       General Gait Details: Min guard progressed to supervision, steady gait, no LOB, good use of rollator; pt with slow gait speed but son reports actually faster than her norm at times  Stairs            Wheelchair Mobility    Modified Rankin (Stroke Patients Only)       Balance Overall balance assessment: Needs assistance Sitting-balance support: No upper extremity supported Sitting balance-Leahy Scale: Normal     Standing balance support: No upper extremity supported, Bilateral upper extremity supported Standing balance-Leahy Scale: Fair Standing balance comment: rollator to ambulate but could stand without support                             Pertinent Vitals/Pain Pain Assessment Pain Assessment: No/denies pain    Home Living Family/patient expects to be discharged to:: Private residence Living Arrangements: Children (daughter moved in) Available Help at Discharge: Family;Personal care  attendant;Available 24 hours/day (Between family and aides has 24 hr coverage) Type of Home: House Home Access: Stairs to enter Entrance Stairs-Rails: None Entrance Stairs-Number of Steps: 1   Home Layout: Multi-level;Able to live on main  level with bedroom/bathroom Home Equipment: Air cabin crew (4 wheels) Additional Comments: Has 2 shower seats (one in shower and one to rest outside shower)    Prior Function               Mobility Comments: Has a rollator uses all the time in community and occasionally inside house; furniture/wall surfs in house frequently; last fall was 07/2020 and started using rollator ADLs Comments: Has assist to get in/out shower, pt performs shower once in; pt dresses self but occasionally needs help with socks or bra hook     Hand Dominance        Extremity/Trunk Assessment   Upper Extremity Assessment Upper Extremity Assessment: Overall WFL for tasks assessed (ROM WFL, MMT: 4+/5 throughout; coordination with finger tip to tip slightly off but equal bilaterally)    Lower Extremity Assessment Lower Extremity Assessment: Overall WFL for tasks assessed (ROM WFL; MMT 5/5; coordination WNL)    Cervical / Trunk Assessment Cervical / Trunk Assessment: Normal  Communication   Communication: No difficulties  Cognition Arousal/Alertness: Awake/alert Behavior During Therapy: WFL for tasks assessed/performed Overall Cognitive Status: Impaired/Different from baseline Area of Impairment: Orientation, Problem solving, Memory, Awareness                 Orientation Level: Time   Memory: Decreased short-term memory     Awareness: Emergent Problem Solving: Slow processing General Comments: History of cognitive impairments.  Pt following basic commands with increased time.  Increased time to respond.  Pt knew day of week and month, but could not state year (family reports that is normal).  Family reports improvement since admission but not back at baseline cognition        General Comments General comments (skin integrity, edema, etc.): VSS; BP supine 116/59, sitting 129/62, standing 127/58; no syncopal symptoms    Exercises     Assessment/Plan    PT Assessment Patient does  not need any further PT services  PT Problem List         PT Treatment Interventions      PT Goals (Current goals can be found in the Care Plan section)  Acute Rehab PT Goals Patient Stated Goal: return home PT Goal Formulation: All assessment and education complete, DC therapy    Frequency       Co-evaluation               AM-PAC PT "6 Clicks" Mobility  Outcome Measure Help needed turning from your back to your side while in a flat bed without using bedrails?: None Help needed moving from lying on your back to sitting on the side of a flat bed without using bedrails?: None Help needed moving to and from a bed to a chair (including a wheelchair)?: A Little Help needed standing up from a chair using your arms (e.g., wheelchair or bedside chair)?: A Little Help needed to walk in hospital room?: A Little Help needed climbing 3-5 steps with a railing? : A Little 6 Click Score: 20    End of Session Equipment Utilized During Treatment: Gait belt Activity Tolerance: Patient tolerated treatment well Patient left: in bed;with call bell/phone within reach;with bed alarm set;with family/visitor present Nurse Communication: Mobility status PT Visit Diagnosis: Other abnormalities of gait and mobility (  R26.89)    Time: 3795-5831 PT Time Calculation (min) (ACUTE ONLY): 37 min   Charges:   PT Evaluation $PT Eval Low Complexity: 1 Low PT Treatments $Therapeutic Activity: 8-22 mins        Abran Richard, PT Acute Rehab [facility] [phone]   Karlton Lemon 03/19/2022, 5:38 PM

## 2022-03-19 NOTE — Progress Notes (Signed)
SLP Cancellation Note  Patient Details Name: Holly Simmons MRN: [id number] DOB: April 10, 1941   Cancelled treatment:       Reason Eval/Treat Not Completed: Patient at procedure or test/unavailable  Unable to complete cognitive linguistic evaluation at this time, as pt is currently off the floor. Will continue efforts.   Neo Yepiz B. Quentin Ore, [facility], [place] Speech Language Pathologist Office: [phone]  Shonna Chock 03/19/2022, 3:34 PM

## 2022-03-19 NOTE — Progress Notes (Signed)
[place] for warfarin Indication: atrial fibrillation  Allergies  Allergen Reactions   Petrolatum-Zinc Oxide Anaphylaxis, Rash and Swelling   Sulfa Antibiotics Rash   Sulfonamide Derivatives Anaphylaxis and Swelling   Tetanus Toxoid     Other reaction(s): Other (See Comments) OTHER REACTION   Tetanus Toxoids Swelling   Other Rash and Other (See Comments)    STERI - STRIPS  It can affect proteins in her kidneys so she doesn't take REACTION: proteinuria Other reaction(s): Other (See Comments), Unknown It can affect proteins in her kidneys so she doesn't take   Nsaids Other (See Comments)    REACTION: proteinuria Other reaction(s): Other (See Comments), Unknown It can affect proteins in her kidneys so she doesn't take    Tetanus Toxoid, Adsorbed Other (See Comments)    Other reaction(s): Other (See Comments) Turned arm red Turned arm red    Patient Measurements: Weight: 71 kg (156 lb 8.4 oz)  Vital Signs: Temp: 98.7 F (37.1 C) (08/16 0429) Temp Source: Oral (08/16 0429) BP: 139/57 (08/16 0600) Pulse Rate: 60 (08/16 0600)  Labs: Recent Labs    03/18/22 1555 03/18/22 1601 03/18/22 1819 03/19/22 0042  HGB 11.5* 12.2  --  10.1*  HCT 36.2 36.0  --  31.1*  PLT 131*  --   --  102*  APTT 38*  --   --   --   LABPROT 26.9*  --   --  28.8*  INR 2.5*  --   --  2.8*  CREATININE 2.48* 2.70*  --  2.13*  TROPONINIHS 15  --  13  --     Estimated Creatinine Clearance: 20.5 mL/min (A) (by C-G formula based on SCr of 2.13 mg/dL (H)).   Medical History: Past Medical History:  Diagnosis Date   Allergic rhinitis    Asthma    NOS w/ acute exacerbation   Blood transfusion without reported diagnosis    CKD (chronic kidney disease) stage 3, GFR 30-59 ml/min (HCC) 09/12/2018   Colon polyps    TUBULAR ADENOMAS AND HYPERPLASTIC   Complete heart block (HCC)    requiring PPM (MDT) post surgical myomectomy at [facility],  leads are epicardial with  abdominal implant, high ventricular threshold at implant   COPD (chronic obstructive pulmonary disease) (HCC)    Depressive disorder    Diastolic dysfunction    DM (diabetes mellitus) ([place])    Gallstones    GERD (gastroesophageal reflux disease)    Gout 08/20/2012   Heart murmur    Hyperpotassemia    Hypersomnia    Hypertension    Hypertrophic cardiomyopathy ([place])    s/p surgical [facility]ctomy at [facility] 21/30 complicated by septal VSD post procedure requiring reoperation with patch closure and tricuspid valve replacement   Kidney stones    Myocardial infarction ([place]) 2011   Obstructive sleep apnea    persistent daytime sleepiness despite cpap   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   Psoriasis    Pyuria    Renal insufficiency    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 INR today 2.8 which is therapeutic.  PTA dosing: 2.'5mg'$  daily, except 1.'5mg'$  Wed  Goal of Therapy:  INR 2-3 Monitor platelets by anticoagulation protocol: Yes   Plan:  Warfarin 1.'5mg'$  PO once Daily INR, s/s bleeding  LErskine Speed PharmD 03/19/2022 8:59 AM

## 2022-03-19 NOTE — Progress Notes (Signed)
OT Cancellation Note  Patient Details Name: Holly Simmons MRN: [id number] DOB: 1940-10-04   Cancelled Treatment:    Reason Eval/Treat Not Completed: Other (comment) Patient just arrived to floor with lunch tray in front of patient at this time. OT to continue to follow and check back as schedule will allow.  Jackelyn Poling OTR/L, [facility] Office# [phone] Pager# [phone]  03/19/2022, 3:46 PM

## 2022-03-19 NOTE — ED Notes (Signed)
MD notified that the family would like to know the prognosis of the pt.

## 2022-03-19 NOTE — Progress Notes (Signed)
Progress Note  Patient: Holly Simmons TDV:[id number] DOB: Feb 23, 1941  DOA: 03/18/2022  DOS: 03/19/2022    Brief hospital course: MAHOGANI HOLOHAN is a 81 y.o. female with a history of stage IV CKD, chronic HFpEF, hypertrophic cardiomyopathy s/p myomectomy 2012, CHB s/p PPM, bioprosthetic TV, atrial fibrillation on coumadin, HLD, cognitive impairment without formal diagnosis who presented to the ED due to aphasia, generalized weakness, near syncope in the setting of decreased (subacute/chronically) oral intake. Found to have AKI. Troponin normal x2, ECG paced. CT head showed no acute abnormalities, CTA head and neck showed chronic ischemic changes, mild nonobstructive atherosclerosis. Neurology was consulted, IV fluids started and patient admitted with concern for prerenal azotemia causing acute encephalopathy. Neurology has low suspicion for stroke.   Assessment and Plan: AKI on stage IV CKD: Improving with IV fluids, dehydrated at intake by history and exam.  - Continue IVF judiciously with cardiac history, start diet, continue dronabinol (not a new medication), avoid nephrotoxins, monitor BMP in AM  Weakness: Nonfocal, improving. This has occurred in the past with dehydration, usually improved enough initially with IVF to be sent home. This is the worst time.  - PT/OT/SLP consulted  Acute metabolic encephalopathy on chronic cognitive impairment, depression: Patient has had abnormal cognitive function for many years without a formal diagnosis. Mentation is improving.  - Consider formal evaluation and consideration of aricept Tx if confirmed - Continue vilazodone and bupropion for mood disorder.  - CT showed no acute abnormalities. Unable to pursue MRI, so repeat CT is ordered by neurology this afternoon. D/w Dr. Lorrin Goodell who will sign off if neuroimaging negative with low suspicion for stroke.   Chronic HFpEF, hypertrophic cardiomyopathy s/p myomectomy 2012, CHB s/p PPM,  bioprosthetic TV, atrial fibrillation on coumadin:  - Continue metoprolol and coumadin dosing per pharmacy, has been on stable dose.  - Cardiac monitoring for now, but can DC if stable. - Echocardiogram pending  COPD: Chronic, stable. No wheezing - continue controller med, prn albuterol.   HLD:  - Continue statin  Thrombocytopenia: Chronic, no bleeding.  - Monitor  Macrocytic anemia:  - Continue home iron supplement  Leukopenia: Unclear significance, had this in Feb 2023 as well. Could consider work up if persistent.  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 - Outpatient follow up recommended  Subjective: Oxygen was placed on the patient overnight, though she denies dyspnea or chest pain. Not in pain at time of encounter. No other complaints. Left AC/forearm IV site infiltrated, had hematoma, not ecchymosis that is not tender  Objective: Vitals:   03/19/22 1219 03/19/22 1220 03/19/22 1300 03/19/22 1536  BP: (!) 150/63  (!) 143/63 (!) 141/61  Pulse: 68 66 60 62  Resp: '18 19 19 19  '$ Temp: 97.8 F (36.6 C)  97.8 F (36.6 C) (!) 97.4 F (36.3 C)  TempSrc: Oral  Oral Oral  SpO2: 100% 100% 97% 99%  Weight:    70.7 kg  Height:    5' 2.99" (1.6 m)   Gen: Elderly, frail, pleasant female in no distress Pulm: Nonlabored breathing room air. Clear CV: Regular rate and rhythm. No murmur, rub, or gallop. + JVD completely supine, no dependent edema. GI: Abdomen soft, non-tender, non-distended, with normoactive bowel sounds.  Ext: Warm, no deformities Skin: No rashes, lesions or ulcers on visualized skin. Neuro: Alert and conversant, interactive, drowsy without focal neurological deficits. Psych: Judgement and insight appear marginal. Calm  Data Personally reviewed: CBC: Recent  Labs  Lab 03/18/22 1555 03/18/22 1601 03/19/22 0042  WBC 4.6  --  3.4*  NEUTROABS 3.3  --   --   HGB 11.5* 12.2 10.1*  HCT 36.2 36.0 31.1*   MCV 100.6*  --  101.0*  PLT 131*  --  073*   Basic Metabolic Panel: Recent Labs  Lab 03/18/22 1555 03/18/22 1601 03/18/22 2024 03/19/22 0042  NA 137 139  --  138  K 4.3 4.4  --  4.0  CL 99 97*  --  105  CO2 30  --   --  26  GLUCOSE 120* 112*  --  84  BUN 28* 31*  --  27*  CREATININE 2.48* 2.70*  --  2.13*  CALCIUM 10.1  --   --  9.3  MG  --   --  2.0  --    GFR: Estimated Creatinine Clearance: 20.5 mL/min (A) (by C-G formula based on SCr of 2.13 mg/dL (H)). Liver Function Tests: Recent Labs  Lab 03/18/22 1555  AST 16  ALT 12  ALKPHOS 131*  BILITOT 0.9  PROT 6.8  ALBUMIN 3.8   No results for input(s): "LIPASE", "AMYLASE" in the last 168 hours. No results for input(s): "AMMONIA" in the last 168 hours. Coagulation Profile: Recent Labs  Lab 03/18/22 1555 03/19/22 0042  INR 2.5* 2.8*   Cardiac Enzymes: No results for input(s): "CKTOTAL", "CKMB", "CKMBINDEX", "TROPONINI" in the last 168 hours. BNP (last 3 results) No results for input(s): "PROBNP" in the last 8760 hours. HbA1C: Recent Labs    03/18/22 2024  HGBA1C 5.3   CBG: Recent Labs  Lab 03/18/22 1555  GLUCAP 120*   Lipid Profile: Recent Labs    03/19/22 0042  CHOL 129  HDL 47  LDLCALC 74  TRIG 40  CHOLHDL 2.7   Thyroid Function Tests: No results for input(s): "TSH", "T4TOTAL", "FREET4", "T3FREE", "THYROIDAB" in the last 72 hours. Anemia Panel: No results for input(s): "VITAMINB12", "FOLATE", "FERRITIN", "TIBC", "IRON", "RETICCTPCT" in the last 72 hours. Urine analysis:    Component Value Date/Time   COLORURINE STRAW (A) 03/18/2022 1800   APPEARANCEUR CLEAR 03/18/2022 1800   LABSPEC 1.012 03/18/2022 1800   PHURINE 7.0 03/18/2022 1800   GLUCOSEU NEGATIVE 03/18/2022 1800   GLUCOSEU NEGATIVE 07/12/2020 1631   HGBUR NEGATIVE 03/18/2022 1800   BILIRUBINUR NEGATIVE 03/18/2022 1800   BILIRUBINUR neg 05/14/2012 1053   KETONESUR NEGATIVE 03/18/2022 1800   PROTEINUR NEGATIVE 03/18/2022 1800    UROBILINOGEN 0.2 07/12/2020 1631   NITRITE NEGATIVE 03/18/2022 1800   LEUKOCYTESUR SMALL (A) 03/18/2022 1800   Recent Results (from the past 240 hour(s))  Resp Panel by RT-PCR (Flu A&B, Covid) Anterior Nasal Swab     Status: None   Collection Time: 03/18/22  3:55 PM   Specimen: Anterior Nasal Swab  Result Value Ref Range Status   SARS Coronavirus 2 by RT PCR NEGATIVE NEGATIVE Final    Comment: (NOTE) SARS-CoV-2 target nucleic acids are NOT DETECTED.  The SARS-CoV-2 RNA is generally detectable in upper respiratory specimens during the acute phase of infection. The lowest concentration of SARS-CoV-2 viral copies this assay can detect is 138 copies/mL. A negative result does not preclude SARS-Cov-2 infection and should not be used as the sole basis for treatment or other patient management decisions. A negative result may occur with  improper specimen collection/handling, submission of specimen other than nasopharyngeal swab, presence of viral mutation(s) within the areas targeted by this assay, and inadequate number of viral copies(<138 copies/mL).  A negative result must be combined with clinical observations, patient history, and epidemiological information. The expected result is Negative.  Fact Sheet for Patients:  EntrepreneurPulse.com.au  Fact Sheet for Healthcare Providers:  IncredibleEmployment.be  This test is no t yet approved or cleared by the [place] FDA and  has been authorized for detection and/or diagnosis of SARS-CoV-2 by FDA under an Emergency Use Authorization (EUA). This EUA will remain  in effect (meaning this test can be used) for the duration of the COVID-19 declaration under Section 564(b)(1) of the Act, 21 U.S.C.section 360bbb-3(b)(1), unless the authorization is terminated  or revoked sooner.       Influenza A by PCR NEGATIVE NEGATIVE Final   Influenza B by PCR NEGATIVE NEGATIVE Final    Comment: (NOTE) The  Xpert Xpress SARS-CoV-2/FLU/RSV plus assay is intended as an aid in the diagnosis of influenza from Nasopharyngeal swab specimens and should not be used as a sole basis for treatment. Nasal washings and aspirates are unacceptable for Xpert Xpress SARS-CoV-2/FLU/RSV testing.  Fact Sheet for Patients: EntrepreneurPulse.com.au  Fact Sheet for Healthcare Providers: IncredibleEmployment.be  This test is not yet approved or cleared by the [place] FDA and has been authorized for detection and/or diagnosis of SARS-CoV-2 by FDA under an Emergency Use Authorization (EUA). This EUA will remain in effect (meaning this test can be used) for the duration of the COVID-19 declaration under Section 564(b)(1) of the Act, 21 U.S.C. section 360bbb-3(b)(1), unless the authorization is terminated or revoked.  Performed at [facility], [address] [place], [place] [address]      ECHOCARDIOGRAM COMPLETE  Result Date: 03/19/2022    ECHOCARDIOGRAM REPORT   Patient Name:   JANDI SWIGER Date of Exam: 03/19/2022 Medical Rec #:  [id number]            Height:       63.0 in Accession #:    [id number]           Weight:       156.5 lb Date of Birth:  01-25-1941           BSA:          1.742 m Patient Age:    55 years             BP:           143/63 mmHg Patient Gender: F                    HR:           68 bpm. Exam Location:  Inpatient Procedure: 2D Echo, Cardiac Doppler and Color Doppler Indications:    Murmur  History:        Patient has prior history of Echocardiogram examinations, most                 recent 06/14/2021. COPD; Risk Factors:Hypertension, Diabetes and                 Sleep Apnea.  Sonographer:    Jefferey Pica Referring Phys: [place]  1. Left ventricular ejection fraction, by estimation, is 50 to 55%. The left ventricle has low normal function. The left ventricle has no regional wall motion abnormalities. There is  mild asymmetric left ventricular hypertrophy of the posterior wall segment. Left ventricular diastolic parameters are indeterminate.  2. Right ventricular systolic function is normal. The right ventricular size is normal. There is moderately elevated pulmonary artery  systolic pressure.  3. Left atrial size was mildly dilated.  4. Right atrial size was severely dilated.  5. The mitral valve is degenerative. No evidence of mitral valve regurgitation. Mild mitral stenosis. Moderate mitral annular calcification.  6. Bioprosthetic tricuspid valve. TV mean gradient 7.7 mmHg suggesting stenosis of the the bioprosthetic valve.  7. The aortic valve is calcified. Aortic valve regurgitation is mild. Mild aortic valve stenosis. Aortic regurgitation PHT measures 377 msec. Aortic valve mean gradient measures 9.0 mmHg. Aortic valve Vmax measures 2.08 m/s.  8. The inferior vena cava is normal in size with greater than 50% respiratory variability, suggesting right atrial pressure of 3 mmHg. FINDINGS  Left Ventricle: Left ventricular ejection fraction, by estimation, is 50 to 55%. The left ventricle has low normal function. The left ventricle has no regional wall motion abnormalities. The left ventricular internal cavity size was normal in size. There is mild asymmetric left ventricular hypertrophy of the posterior wall segment. Left ventricular diastolic parameters are indeterminate. Right Ventricle: The right ventricular size is normal. No increase in right ventricular wall thickness. Right ventricular systolic function is normal. There is moderately elevated pulmonary artery systolic pressure. The tricuspid regurgitant velocity is 2.76 m/s, and with an assumed right atrial pressure of 15 mmHg, the estimated right ventricular systolic pressure is 16.1 mmHg. Left Atrium: Left atrial size was mildly dilated. Right Atrium: Right atrial size was severely dilated. Pericardium: There is no evidence of pericardial effusion. Mitral Valve:  The mitral valve is degenerative in appearance. Moderate mitral annular calcification. No evidence of mitral valve regurgitation. Mild mitral valve stenosis. Tricuspid Valve: Bioprosthetic tricuspid valve. TV mean gradient 7.7 mmHg suggesting stenosis of the the bioprosthetic valve. Tricuspid valve regurgitation is not demonstrated. Aortic Valve: The aortic valve is calcified. Aortic valve regurgitation is mild. Aortic regurgitation PHT measures 377 msec. Mild aortic stenosis is present. Aortic valve mean gradient measures 9.0 mmHg. Aortic valve peak gradient measures 17.4 mmHg. Aortic valve area, by VTI measures 2.87 cm. Pulmonic Valve: The pulmonic valve was not well visualized. Pulmonic valve regurgitation is not visualized. No evidence of pulmonic stenosis. Aorta: The aortic root is normal in size and structure. Venous: The inferior vena cava is normal in size with greater than 50% respiratory variability, suggesting right atrial pressure of 3 mmHg. IAS/Shunts: No atrial level shunt detected by color flow Doppler.  LEFT VENTRICLE PLAX 2D LVIDd:         5.00 cm   Diastology LVIDs:         3.80 cm   LV e' medial:    3.09 cm/s LV PW:         1.10 cm   LV E/e' medial:  39.5 LV IVS:        0.80 cm   LV e' lateral:   5.06 cm/s LVOT diam:     2.00 cm   LV E/e' lateral: 24.1 LV SV:         135 LV SV Index:   78 LVOT Area:     3.14 cm  IVC IVC diam: 2.50 cm LEFT ATRIUM             Index        RIGHT ATRIUM           Index LA diam:        4.30 cm 2.47 cm/m   RA Area:     23.40 cm LA Vol (A2C):   59.4 ml 34.09 ml/m  RA Volume:  80.30 ml  46.09 ml/m LA Vol (A4C):   66.8 ml 38.34 ml/m LA Biplane Vol: 64.5 ml 37.02 ml/m  AORTIC VALVE                     PULMONIC VALVE AV Area (Vmax):    3.09 cm      PV Vmax:       0.88 m/s AV Area (Vmean):   2.89 cm      PV Peak grad:  3.1 mmHg AV Area (VTI):     2.87 cm AV Vmax:           208.50 cm/s AV Vmean:          137.000 cm/s AV VTI:            0.471 m AV Peak Grad:       17.4 mmHg AV Mean Grad:      9.0 mmHg LVOT Vmax:         205.00 cm/s LVOT Vmean:        126.000 cm/s LVOT VTI:          0.431 m LVOT/AV VTI ratio: 0.92 AI PHT:            377 msec  AORTA Ao Root diam: 3.30 cm Ao Asc diam:  2.70 cm MITRAL VALVE                TRICUSPID VALVE MV Area (PHT): 3.34 cm     TV Peak grad:   12.1 mmHg MV Decel Time: 227 msec     TV Mean grad:   7.7 mmHg MV E velocity: 122.00 cm/s  TV Vmax:        1.74 m/s MV A velocity: 94.70 cm/s   TV Vmean:       132.0 cm/s MV E/A ratio:  1.29         TV VTI:         0.83 msec                             TR Peak grad:   30.5 mmHg                             TR Vmax:        276.00 cm/s                              SHUNTS                             Systemic VTI:  0.43 m                             Systemic Diam: 2.00 cm Kardie Tobb DO Electronically signed by Berniece Salines DO Signature Date/Time: 03/19/2022/4:05:25 PM    Final    CT HEAD WO CONTRAST  Result Date: 03/18/2022 CLINICAL DATA:  Recent syncopal episode EXAM: CT HEAD WITHOUT CONTRAST TECHNIQUE: Contiguous axial images were obtained from the base of the skull through the vertex without intravenous contrast. RADIATION DOSE REDUCTION: This exam was performed according to the departmental dose-optimization program which includes automated exposure control, adjustment of the mA and/or kV according to patient size and/or use of iterative reconstruction technique. COMPARISON:  CT  from earlier in the same day. FINDINGS: Brain: No evidence of acute infarction, hemorrhage, hydrocephalus, extra-axial collection or mass lesion/mass effect. Chronic ischemic changes are again noted and stable. Vascular: No hyperdense vessel or unexpected calcification. Skull: Normal. Negative for fracture or focal lesion. Sinuses/Orbits: No acute finding. Other: None. IMPRESSION: Chronic ischemic changes without acute abnormality. No significant interval change from the prior study. Electronically Signed   By: Inez Catalina  M.D.   On: 03/18/2022 22:20   CT ANGIO HEAD NECK W WO CM (CODE STROKE)  Result Date: 03/18/2022 CLINICAL DATA:  Acute neuro deficit.  Stroke EXAM: CT ANGIOGRAPHY HEAD AND NECK TECHNIQUE: Multidetector CT imaging of the head and neck was performed using the standard protocol during bolus administration of intravenous contrast. Multiplanar CT image reconstructions and MIPs were obtained to evaluate the vascular anatomy. Carotid stenosis measurements (when applicable) are obtained utilizing NASCET criteria, using the distal internal carotid diameter as the denominator. RADIATION DOSE REDUCTION: This exam was performed according to the departmental dose-optimization program which includes automated exposure control, adjustment of the mA and/or kV according to patient size and/or use of iterative reconstruction technique. CONTRAST:  58m OMNIPAQUE IOHEXOL 350 MG/ML SOLN COMPARISON:  CT head 03/18/2022 FINDINGS: CTA NECK FINDINGS Aortic arch: Standard branching. Imaged portion shows no evidence of aneurysm or dissection. No significant stenosis of the major arch vessel origins. Right carotid system: Mild atherosclerotic disease right carotid bifurcation. No significant right carotid stenosis. Beaded appearance of the right internal carotid artery compatible with fibromuscular dysplasia. No stenosis or dissection Left carotid system: Mild atherosclerotic disease left carotid bifurcation without stenosis. Beaded appearance left internal carotid artery compatible with fibromuscular dysplasia. No dissection or stenosis. Vertebral arteries: Both vertebral arteries are patent to the skull base without stenosis. Skeleton: Cervical spondylosis. No acute skeletal abnormality. Dental caries. Other neck: Negative for mass or edema. Gas in the veins in the left neck most likely introduced at vena puncture. Upper chest: Lung apices clear bilaterally Review of the MIP images confirms the above findings CTA HEAD FINDINGS Anterior  circulation: Atherosclerotic calcification in the cavernous carotid bilaterally causing mild stenosis bilaterally. Anterior middle cerebral arteries opacified normally without stenosis or large vessel occlusion. 3 mm aneurysm of the supraclinoid internal carotid artery projecting posterolaterally. Probable posterior communicating artery aneurysm. Posterior circulation: Both vertebral arteries patent to the basilar. PICA patent bilaterally. Basilar widely patent. Superior cerebellar and posterior cerebral arteries patent bilaterally. No stenosis or aneurysm Venous sinuses: Limited venous enhancement due to arterial phase scanning Anatomic variants: None Review of the MIP images confirms the above findings IMPRESSION: 1. No intracranial large vessel occlusion or significant stenosis 2. Mild atherosclerotic disease carotid bifurcation bilaterally without stenosis. 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 3. Both vertebral arteries widely patent 4. 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 Electronically Signed   By: CFranchot GalloM.D.   On: 03/18/2022 16:27   CT HEAD CODE STROKE WO CONTRAST  Result Date: 03/18/2022 CLINICAL DATA:  Code stroke.  Acute neuro deficit. EXAM: CT HEAD WITHOUT CONTRAST TECHNIQUE: Contiguous axial images were obtained from the base of the skull through the vertex without intravenous contrast. RADIATION DOSE REDUCTION: This exam was performed according to the departmental dose-optimization program which includes automated exposure control, adjustment of the mA and/or kV according to patient size and/or use of iterative reconstruction technique. COMPARISON:  CT head 07/12/2020 FINDINGS: Brain: Ventricle size normal. Progression of white matter hypodensity  diffusely which appears chronic. Negative for acute infarct, hemorrhage, mass. Vascular: Negative for hyperdense vessel Skull: No focal  abnormality. Sinuses/Orbits: Paranasal sinuses clear. Bilateral cataract extraction Other: None ASPECTS ([place] Stroke Program Early CT Score) - Ganglionic level infarction (caudate, lentiform nuclei, internal capsule, insula, M1-M3 cortex): 7 - Supraganglionic infarction (M4-M6 cortex): 3 Total score (0-10 with 10 being normal): 10 IMPRESSION: 1. No acute abnormality 2. ASPECTS is 10 3. Progression of chronic microvascular ischemia since 2021 4. Code stroke imaging results were communicated on 03/18/2022 at 4:06 pm to provider Lorrin Goodell via text page Electronically Signed   By: Franchot Gallo M.D.   On: 03/18/2022 16:08    Family Communication: Son, Daughter at bedside  Disposition: Status is: Inpatient Remains inpatient appropriate because: Persistent encephalopathy, AKI, CVA rule out Planned Discharge Destination:  TBD      Patrecia Pour, MD 03/19/2022 4:33 PM Page by [email or web address]

## 2022-03-19 NOTE — Progress Notes (Signed)
Brief Neuro Update:  Likely encephalopathy and not aphasia. Reviewed repeat CTH which was negative for an acute stroke. We will signoff.  [place] Pager Number [phone]

## 2022-03-19 NOTE — ED Notes (Signed)
ED TO INPATIENT HANDOFF REPORT  ED Nurse Name and Phone #: Baxter Flattery, RN  S Name/Age/Gender Holly Simmons 81 y.o. female Room/Bed: 046C/046C  Code Status   Code Status: Full Code  Home/SNF/Other Home Patient oriented to: self, place, time, and situation Is this baseline? Yes   Triage Complete: Triage complete  Chief Complaint AKI (acute kidney injury) [facility]) [N17.9]  Triage Note Pt arriving to [facility]  via [facility]. Code stroke was activated by EMS.   Patient was at [place] where she was LKW at 1430 and now complaining of syncopal episode. Patient was walking with her shopping cart when her family member noticed her to have a near syncopal episode. When she began speaking, she was noted to be aphasic.    Allergies Allergies  Allergen Reactions   Petrolatum-Zinc Oxide Anaphylaxis, Rash and Swelling   Sulfa Antibiotics Rash   Sulfonamide Derivatives Anaphylaxis and Swelling   Tetanus Toxoid     Other reaction(s): Other (See Comments) OTHER REACTION   Tetanus Toxoids Swelling   Other Rash and Other (See Comments)    STERI - STRIPS  It can affect proteins in her kidneys so she doesn't take REACTION: proteinuria Other reaction(s): Other (See Comments), Unknown It can affect proteins in her kidneys so she doesn't take   Nsaids Other (See Comments)    REACTION: proteinuria Other reaction(s): Other (See Comments), Unknown It can affect proteins in her kidneys so she doesn't take    Tetanus Toxoid, Adsorbed Other (See Comments)    Other reaction(s): Other (See Comments) Turned arm red Turned arm red    Level of Care/Admitting Diagnosis ED Disposition     ED Disposition  Admit   Condition  --   Comment  Hospital Area: [place] [[id number]]  Level of Care: Telemetry Cardiac [[id number]]  May admit patient to [facility] or [facility] if equivalent level of care is available:: No  Covid Evaluation: Asymptomatic - no recent exposure (last 10  days) testing not required  Diagnosis: AKI (acute kidney injury) [facility]) [[id number]]  Admitting Physician: Charlynne Cousins [[id number]]  Attending Physician: Charlynne Cousins [[id number]]  Certification:: I certify this patient will need inpatient services for at least 2 midnights          B Medical/Surgery History Past Medical History:  Diagnosis Date   Allergic rhinitis    Asthma    NOS w/ acute exacerbation   Blood transfusion without reported diagnosis    CKD (chronic kidney disease) stage 3, GFR 30-59 ml/min (HCC) 09/12/2018   Colon polyps    TUBULAR ADENOMAS AND HYPERPLASTIC   Complete heart 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[place])    requiring PPM (MDT) post surgical [facility]omectomy at [facility],  leads are epicardial with abdominal implant, high ventricular threshold at implant   COPD (chronic obstructive pulmonary disease) ([place])    Depressive disorder    Diastolic dysfunction    DM (diabetes mellitus) ([place])    Gallstones    GERD (gastroesophageal reflux disease)    Gout 08/20/2012   Heart murmur    Hyperpotassemia    Hypersomnia    Hypertension    Hypertrophic cardiomyopathy ([place])    s/p surgical myomectomy at [facility] 32/67 complicated by septal VSD post procedure requiring reoperation with patch closure and tricuspid valve replacement   Kidney stones    Myocardial infarction ([place]) 2011   Obstructive sleep apnea    persistent daytime sleepiness despite cpap   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   Psoriasis    Pyuria  Renal insufficiency    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   Typical atrial flutter ([place] 9/15   Past Surgical History:  Procedure Laterality Date   ABDOMINAL HYSTERECTOMY     APPENDECTOMY     BREAST CYST EXCISION     CARDIOVERSION N/A 08/01/2014   Procedure: CARDIOVERSION;  Surgeon: PThayer Headings MD;  [facility]  Service: Cardiovascular;  Laterality: N/A;   [place] [id number]  left eye lens implant   EYE  SURGERY  [id number]  right eye lens implant   INSERT / REPLACE / REMOVE PACEMAKER  07/13/2012   PACEMAKER INSERTION  07/02/11   [facility]  high ventricular lead threshold at implant per Dr HWestley Gambles  PERMANENT PACEMAKER INSERTION N/A 07/13/2012   Procedure: PERMANENT PACEMAKER INSERTION;  Surgeon: JThompson Grayer MD;  [facility];  Service: Cardiovascular;  Laterality: N/A;   POLYPECTOMY     septal myomectomy for hypertrophic CM  06[facility]23/11   [facility] requiring patch repair and tricuspid valve replacement   TONSILLECTOMY     TRICUSPID VALVE REPLACEMENT  11/12   Medtronic 212mMosaic tissue valve   VSD REPAIR  06/2011     A IV Location/Drains/Wounds Patient Lines/Drains/Airways Status     Active Line/Drains/Airways     Name Placement date Placement time Site Days   Peripheral IV 03/18/22 20 G Anterior;Left Forearm 03/18/22  --  Forearm  1   Incision 07/13/12 Chest Left 07/13/12  1100  -- 3536            Intake/Output Last 24 hours No intake or output data in the 24 hours ending 03/19/22 1341  Labs/Imaging Results for orders placed or performed during the hospital encounter of 03/18/22 (from the past 48 hour(s))  Resp Panel by RT-PCR (Flu A&B, Covid) Anterior Nasal Swab     Status: None   Collection Time: 03/18/22  3:55 PM   Specimen: Anterior Nasal Swab  Result Value Ref Range   SARS Coronavirus 2 by RT PCR NEGATIVE NEGATIVE    Comment: (NOTE) SARS-CoV-2 target nucleic acids are NOT DETECTED.  The SARS-CoV-2 RNA is generally detectable in upper respiratory specimens during the acute phase of infection. The lowest concentration of SARS-CoV-2 viral copies this assay can detect is 138 copies/mL. A negative result does not preclude SARS-Cov-2 infection and should not be used as the sole basis for treatment or other patient management decisions. A negative result may occur with   improper specimen collection/handling, submission of specimen other than nasopharyngeal swab, presence of viral mutation(s) within the areas targeted by this assay, and inadequate number of viral copies(<138 copies/mL). A negative result must be combined with clinical observations, patient history, and epidemiological information. The expected result is Negative.  Fact Sheet for Patients:  htEntrepreneurPulse.com.auFact Sheet for Healthcare Providers:  htIncredibleEmployment.beThis test is no t yet approved or cleared by the [place] and  has been authorized for detection and/or diagnosis of SARS-CoV-2 by FDA under an Emergency Use Authorization (EUA). This EUA will remain  in effect (meaning this test can be used) for the duration of the COVID-19 declaration under Section 564(b)(1) of the Act, 21 U.S.C.section 360bbb-3(b)(1), unless the authorization is terminated  or revoked sooner.       Influenza  A by PCR NEGATIVE NEGATIVE   Influenza B by PCR NEGATIVE NEGATIVE    Comment: (NOTE) The Xpert Xpress SARS-CoV-2/FLU/RSV plus assay is intended as an aid in the diagnosis of influenza from Nasopharyngeal swab specimens and should not be used as a sole basis for treatment. Nasal washings and aspirates are unacceptable for Xpert Xpress SARS-CoV-2/FLU/RSV testing.  Fact Sheet for Patients: EntrepreneurPulse.com.au  Fact Sheet for Healthcare Providers: IncredibleEmployment.be  This test is not yet approved or cleared by the [place] FDA and has been authorized for detection and/or diagnosis of SARS-CoV-2 by FDA under an Emergency Use Authorization (EUA). This EUA will remain in effect (meaning this test can be used) for the duration of the COVID-19 declaration under Section 564(b)(1) of the Act, 21 U.S.C. section 360bbb-3(b)(1), unless the authorization is terminated or revoked.  Performed at  [facility], [place] [address], [place], [place] [address]   Protime-INR     Status: Abnormal   Collection Time: 03/18/22  3:55 PM  Result Value Ref Range   Prothrombin Time 26.9 (H) 11.4 - 15.2 seconds   INR 2.5 (H) 0.8 - 1.2    Comment: (NOTE) INR goal varies based on device and disease states. Performed at [facility], [place] [address] [place], [place] [address]   APTT     Status: Abnormal   Collection Time: 03/18/22  3:55 PM  Result Value Ref Range   aPTT 38 (H) 24 - 36 seconds    Comment:        IF BASELINE aPTT IS ELEVATED, SUGGEST PATIENT RISK ASSESSMENT BE USED TO DETERMINE APPROPRIATE ANTICOAGULANT THERAPY. Performed at [facility], [place] [address] [place], [place] [address]   CBC     Status: Abnormal   Collection Time: 03/18/22  3:55 PM  Result Value Ref Range   WBC 4.6 4.0 - 10.5 K/uL   RBC 3.60 (L) 3.87 - 5.11 MIL/uL   Hemoglobin 11.5 (L) 12.0 - 15.0 g/dL   HCT 36.2 36.0 - 46.0 %   MCV 100.6 (H) 80.0 - 100.0 fL   MCH 31.9 26.0 - 34.0 pg   MCHC 31.8 30.0 - 36.0 g/dL   RDW 14.2 11.5 - 15.5 %   Platelets 131 (L) 150 - 400 K/uL   nRBC 0.0 0.0 - 0.2 %    Comment: Performed at [facility], [place] [address] [place], [place] [address]  Differential     Status: Abnormal   Collection Time: 03/18/22  3:55 PM  Result Value Ref Range   Neutrophils Relative % 71 %   Neutro Abs 3.3 1.7 - 7.7 K/uL   Lymphocytes Relative 10 %   Lymphs Abs 0.4 (L) 0.7 - 4.0 K/uL   Monocytes Relative 13 %   Monocytes Absolute 0.6 0.1 - 1.0 K/uL   Eosinophils Relative 4 %   Eosinophils Absolute 0.2 0.0 - 0.5 K/uL   Basophils Relative 1 %   Basophils Absolute 0.0 0.0 - 0.1 K/uL   Immature Granulocytes 1 %   Abs Immature Granulocytes 0.05 0.00 - 0.07 K/uL    Comment: Performed at [facility] [address], [place],  [address]  Comprehensive metabolic panel     Status: Abnormal   Collection Time: 03/18/22  3:55 PM  Result Value Ref Range   Sodium  137 135 - 145 mmol/L   Potassium 4.3 3.5 - 5.1 mmol/L   Chloride 99 98 - 111 mmol/L   CO2 30  22 - 32 mmol/L   Glucose, Bld 120 (H) 70 - 99 mg/dL    Comment: Glucose reference range applies only to samples taken after fasting for at least 8 hours.   BUN 28 (H) 8 - 23 mg/dL   Creatinine, Ser 2.48 (H) 0.44 - 1.00 mg/dL   Calcium 10.1 8.9 - 10.3 mg/dL   Total Protein 6.8 6.5 - 8.1 g/dL   Albumin 3.8 3.5 - 5.0 g/dL   AST 16 15 - 41 U/L   ALT 12 0 - 44 U/L   Alkaline Phosphatase 131 (H) 38 - 126 U/L   Total Bilirubin 0.9 0.3 - 1.2 mg/dL   GFR, Estimated 19 (L) >60 mL/min    Comment: (NOTE) Calculated using the CKD-EPI Creatinine Equation (2021)    Anion gap 8 5 - 15    Comment: Performed at [facility] [address] [place], [place] [address]  Troponin I (High Sensitivity)     Status: None   Collection Time: 03/18/22  3:55 PM  Result Value Ref Range   Troponin I (High Sensitivity) 15 <18 ng/L    Comment: (NOTE) Elevated high sensitivity troponin I (hsTnI) values and significant  changes across serial measurements may suggest ACS but many other  chronic and acute conditions are known to elevate hsTnI results.  Refer to the "Links" section for chest pain algorithms and additional  guidance. Performed at [facility], [place] [address] [place], [place] [address]   CBG monitoring, ED     Status: Abnormal   Collection Time: 03/18/22  3:55 PM  Result Value Ref Range   Glucose-Capillary 120 (H) 70 - 99 mg/dL    Comment: Glucose reference range applies only to samples taken after fasting for at least 8 hours.  Ethanol     Status: None   Collection Time: 03/18/22  4:00 PM  Result Value Ref Range   Alcohol, Ethyl (B) <10 <10 mg/dL    Comment: (NOTE) Lowest detectable limit for serum alcohol is 10 mg/dL.  For medical purposes only. Performed at [facility], [place] [address], [place], [place] [address]   I-stat chem 8, ED     Status: Abnormal   Collection Time:  03/18/22  4:01 PM  Result Value Ref Range   Sodium 139 135 - 145 mmol/L   Potassium 4.4 3.5 - 5.1 mmol/L   Chloride 97 (L) 98 - 111 mmol/L   BUN 31 (H) 8 - 23 mg/dL   Creatinine, Ser 2.70 (H) 0.44 - 1.00 mg/dL   Glucose, Bld 112 (H) 70 - 99 mg/dL    Comment: Glucose reference range applies only to samples taken after fasting for at least 8 hours.   Calcium, Ion 1.19 1.15 - 1.40 mmol/L   TCO2 30 22 - 32 mmol/L   Hemoglobin 12.2 12.0 - 15.0 g/dL   HCT 36.0 36.0 - 46.0 %  Urine rapid drug screen (hosp performed)     Status: Abnormal   Collection Time: 03/18/22  6:00 PM  Result Value Ref Range   Opiates NONE DETECTED NONE DETECTED   Cocaine NONE DETECTED NONE DETECTED   Benzodiazepines NONE DETECTED NONE DETECTED   Amphetamines NONE DETECTED NONE DETECTED   Tetrahydrocannabinol POSITIVE (A) NONE DETECTED   Barbiturates NONE DETECTED NONE DETECTED    Comment: (NOTE) DRUG SCREEN FOR MEDICAL PURPOSES ONLY.  IF CONFIRMATION IS NEEDED FOR ANY PURPOSE, NOTIFY LAB WITHIN 5 DAYS.  LOWEST DETECTABLE LIMITS FOR URINE DRUG SCREEN Drug  Class                     Cutoff (ng/mL) Amphetamine and metabolites    1000 Barbiturate and metabolites    200 Benzodiazepine                 295 Tricyclics and metabolites     300 Opiates and metabolites        300 Cocaine and metabolites        300 THC                            50 Performed at [facility], [place] [address] [place], [place] [address]   Urinalysis, Routine w reflex microscopic     Status: Abnormal   Collection Time: 03/18/22  6:00 PM  Result Value Ref Range   Color, Urine STRAW (A) YELLOW   APPearance CLEAR CLEAR   Specific Gravity, Urine 1.012 1.005 - 1.030   pH 7.0 5.0 - 8.0   Glucose, UA NEGATIVE NEGATIVE mg/dL   Hgb urine dipstick NEGATIVE NEGATIVE   Bilirubin Urine NEGATIVE NEGATIVE   Ketones, ur NEGATIVE NEGATIVE mg/dL   Protein, ur NEGATIVE NEGATIVE mg/dL   Nitrite NEGATIVE NEGATIVE   Leukocytes,Ua SMALL (A)  NEGATIVE   WBC, UA 0-5 0 - 5 WBC/hpf   Bacteria, UA RARE (A) NONE SEEN   Squamous Epithelial / LPF 0-5 0 - 5   Mucus PRESENT    Hyaline Casts, UA PRESENT     Comment: Performed at [facility] [address] [place], [place] [address]  Troponin I (High Sensitivity)     Status: None   Collection Time: 03/18/22  6:19 PM  Result Value Ref Range   Troponin I (High Sensitivity) 13 <18 ng/L    Comment: (NOTE) Elevated high sensitivity troponin I (hsTnI) values and significant  changes across serial measurements may suggest ACS but many other  chronic and acute conditions are known to elevate hsTnI results.  Refer to the "Links" section for chest pain algorithms and additional  guidance. Performed at [facility], [place] [address] [place], [place] [address]   Magnesium     Status: None   Collection Time: 03/18/22  8:24 PM  Result Value Ref Range   Magnesium 2.0 1.7 - 2.4 mg/dL    Comment: Performed at [facility], [place] [address], [place], [place] [address]  Hemoglobin A1c     Status: None   Collection Time: 03/18/22  8:24 PM  Result Value Ref Range   Hgb A1c MFr Bld 5.3 4.8 - 5.6 %    Comment: (NOTE) Pre diabetes:          5.7%-6.4%  Diabetes:              >6.4%  Glycemic control for   <7.0% adults with diabetes    Mean Plasma Glucose 105.41 mg/dL    Comment: Performed at [facility] [address] [place], [place] [address]  Protime-INR     Status: Abnormal   Collection Time: 03/19/22 12:42 AM  Result Value Ref Range   Prothrombin Time 28.8 (H) 11.4 - 15.2 seconds   INR 2.8 (H) 0.8 - 1.2    Comment: (NOTE) INR goal varies based on device and disease states. Performed at [facility], [place] [address], [place], [place] [address]   CBC     Status:  Abnormal   Collection Time: 03/19/22 12:42 AM  Result Value Ref Range   WBC 3.4 (L) 4.0 - 10.5 K/uL   RBC 3.08 (L) 3.87 - 5.11 MIL/uL   Hemoglobin 10.1 (L) 12.0 - 15.0 g/dL   HCT 31.1 (L) 36.0  - 46.0 %   MCV 101.0 (H) 80.0 - 100.0 fL   MCH 32.8 26.0 - 34.0 pg   MCHC 32.5 30.0 - 36.0 g/dL   RDW 14.2 11.5 - 15.5 %   Platelets 102 (L) 150 - 400 K/uL   nRBC 0.0 0.0 - 0.2 %    Comment: Performed at [facility] [address], [place], [place] [address]  Basic metabolic panel     Status: Abnormal   Collection Time: 03/19/22 12:42 AM  Result Value Ref Range   Sodium 138 135 - 145 mmol/L   Potassium 4.0 3.5 - 5.1 mmol/L   Chloride 105 98 - 111 mmol/L   CO2 26 22 - 32 mmol/L   Glucose, Bld 84 70 - 99 mg/dL    Comment: Glucose reference range applies only to samples taken after fasting for at least 8 hours.   BUN 27 (H) 8 - 23 mg/dL   Creatinine, Ser 2.13 (H) 0.44 - 1.00 mg/dL   Calcium 9.3 8.9 - 10.3 mg/dL   GFR, Estimated 23 (L) >60 mL/min    Comment: (NOTE) Calculated using the CKD-EPI Creatinine Equation (2021)    Anion gap 7 5 - 15    Comment: Performed at [facility] [address], [place], [place] [address]  Lipid panel     Status: None   Collection Time: 03/19/22 12:42 AM  Result Value Ref Range   Cholesterol 129 0 - 200 mg/dL   Triglycerides 40 <150 mg/dL   HDL 47 >40 mg/dL   Total CHOL/HDL Ratio 2.7 RATIO   VLDL 8 0 - 40 mg/dL   LDL Cholesterol 74 0 - 99 mg/dL    Comment:        Total Cholesterol/HDL:CHD Risk Coronary Heart Disease Risk Table                     Men   Women  1/2 Average Risk   3.4   3.3  Average Risk       5.0   4.4  2 X Average Risk   9.6   7.1  3 X Average Risk  23.4   11.0        Use the calculated Patient Ratio above and the CHD Risk Table to determine the patient's CHD Risk.        ATP III CLASSIFICATION (LDL):  <100     mg/dL   Optimal  100-129  mg/dL   Near or Above                    Optimal  130-159  mg/dL   Borderline  160-189  mg/dL   High  >190     mg/dL   Very High Performed at [facility] [address], [place], [place] [address]    *Note: Due to a large number of results and/or encounters  for the requested time period, some results have not been displayed. A complete set of results can be found in Results Review.   CT HEAD WO CONTRAST  Result Date: 03/18/2022 CLINICAL DATA:  Recent syncopal episode EXAM: CT HEAD WITHOUT CONTRAST TECHNIQUE: Contiguous axial images were obtained from  the base of the skull through the vertex without intravenous contrast. RADIATION DOSE REDUCTION: This exam was performed according to the departmental dose-optimization program which includes automated exposure control, adjustment of the mA and/or kV according to patient size and/or use of iterative reconstruction technique. COMPARISON:  CT from earlier in the same day. FINDINGS: Brain: No evidence of acute infarction, hemorrhage, hydrocephalus, extra-axial collection or mass lesion/mass effect. Chronic ischemic changes are again noted and stable. Vascular: No hyperdense vessel or unexpected calcification. Skull: Normal. Negative for fracture or focal lesion. Sinuses/Orbits: No acute finding. Other: None. IMPRESSION: Chronic ischemic changes without acute abnormality. No significant interval change from the prior study. Electronically Signed   By: Inez Catalina M.D.   On: 03/18/2022 22:20   CT ANGIO HEAD NECK W WO CM (CODE STROKE)  Result Date: 03/18/2022 CLINICAL DATA:  Acute neuro deficit.  Stroke EXAM: CT ANGIOGRAPHY HEAD AND NECK TECHNIQUE: Multidetector CT imaging of the head and neck was performed using the standard protocol during bolus administration of intravenous contrast. Multiplanar CT image reconstructions and MIPs were obtained to evaluate the vascular anatomy. Carotid stenosis measurements (when applicable) are obtained utilizing NASCET criteria, using the distal internal carotid diameter as the denominator. RADIATION DOSE REDUCTION: This exam was performed according to the departmental dose-optimization program which includes automated exposure control, adjustment of the mA and/or kV according to  patient size and/or use of iterative reconstruction technique. CONTRAST:  22m OMNIPAQUE IOHEXOL 350 MG/ML SOLN COMPARISON:  CT head 03/18/2022 FINDINGS: CTA NECK FINDINGS Aortic arch: Standard branching. Imaged portion shows no evidence of aneurysm or dissection. No significant stenosis of the major arch vessel origins. Right carotid system: Mild atherosclerotic disease right carotid bifurcation. No significant right carotid stenosis. Beaded appearance of the right internal carotid artery compatible with fibromuscular dysplasia. No stenosis or dissection Left carotid system: Mild atherosclerotic disease left carotid bifurcation without stenosis. Beaded appearance left internal carotid artery compatible with fibromuscular dysplasia. No dissection or stenosis. Vertebral arteries: Both vertebral arteries are patent to the skull base without stenosis. Skeleton: Cervical spondylosis. No acute skeletal abnormality. Dental caries. Other neck: Negative for mass or edema. Gas in the veins in the left neck most likely introduced at vena puncture. Upper chest: Lung apices clear bilaterally Review of the MIP images confirms the above findings CTA HEAD FINDINGS Anterior circulation: Atherosclerotic calcification in the cavernous carotid bilaterally causing mild stenosis bilaterally. Anterior middle cerebral arteries opacified normally without stenosis or large vessel occlusion. 3 mm aneurysm of the supraclinoid internal carotid artery projecting posterolaterally. Probable posterior communicating artery aneurysm. Posterior circulation: Both vertebral arteries patent to the basilar. PICA patent bilaterally. Basilar widely patent. Superior cerebellar and posterior cerebral arteries patent bilaterally. No stenosis or aneurysm Venous sinuses: Limited venous enhancement due to arterial phase scanning Anatomic variants: None Review of the MIP images confirms the above findings IMPRESSION: 1. No intracranial large vessel occlusion or  significant stenosis 2. Mild atherosclerotic disease carotid bifurcation bilaterally without stenosis. 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 3. Both vertebral arteries widely patent 4. 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 Electronically Signed   By: CFranchot GalloM.D.   On: 03/18/2022 16:27   CT HEAD CODE STROKE WO CONTRAST  Result Date: 03/18/2022 CLINICAL DATA:  Code stroke.  Acute neuro deficit. EXAM: CT HEAD WITHOUT CONTRAST TECHNIQUE: Contiguous axial images were obtained from the base of the skull through the vertex without intravenous contrast. RADIATION  DOSE REDUCTION: This exam was performed according to the departmental dose-optimization program which includes automated exposure control, adjustment of the mA and/or kV according to patient size and/or use of iterative reconstruction technique. COMPARISON:  CT head 07/12/2020 FINDINGS: Brain: Ventricle size normal. Progression of white matter hypodensity diffusely which appears chronic. Negative for acute infarct, hemorrhage, mass. Vascular: Negative for hyperdense vessel Skull: No focal abnormality. Sinuses/Orbits: Paranasal sinuses clear. Bilateral cataract extraction Other: None ASPECTS ([place] Stroke Program Early CT Score) - Ganglionic level infarction (caudate, lentiform nuclei, internal capsule, insula, M1-M3 cortex): 7 - Supraganglionic infarction (M4-M6 cortex): 3 Total score (0-10 with 10 being normal): 10 IMPRESSION: 1. No acute abnormality 2. ASPECTS is 10 3. Progression of chronic microvascular ischemia since 2021 4. Code stroke imaging results were communicated on 03/18/2022 at 4:06 pm to provider Lorrin Goodell via text page Electronically Signed   By: Franchot Gallo M.D.   On: 03/18/2022 16:08    Pending Labs Unresulted Labs (From admission, onward)     Start     Ordered   03/19/22 0500  Protime-INR  Daily at 5am,   R       03/18/22 1813            Vitals/Pain Today's Vitals   03/19/22 1200 03/19/22 1218 03/19/22 1219 03/19/22 1220  BP: (!) 142/64 (!) 150/63 (!) 150/63   Pulse: 62 76 68 66  Resp: '13 14 18 19  '$ Temp:   97.8 F (36.6 C)   TempSrc:   Oral   SpO2: 96% 91% 100% 100%  Weight:        Isolation Precautions No active isolations  Medications Medications  metoprolol tartrate (LOPRESSOR) tablet 25 mg (25 mg Oral Given 03/19/22 1111)  pravastatin (PRAVACHOL) tablet 20 mg (has no administration in time range)  buPROPion (WELLBUTRIN XL) 24 hr tablet 300 mg (300 mg Oral Given 03/19/22 1109)  Vilazodone HCl TABS 40 mg (40 mg Oral Given 03/19/22 1112)  dronabinol (MARINOL) capsule 5 mg (has no administration in time range)  famotidine (PEPCID) tablet 40 mg (40 mg Oral Given 03/19/22 1110)  ferrous sulfate tablet 325 mg (325 mg Oral Given 03/19/22 1110)  lamoTRIgine (LAMICTAL) tablet 25 mg (25 mg Oral Given 03/19/22 1111)  ipratropium (ATROVENT) 0.06 % nasal spray 2 spray (has no administration in time range)  mometasone-formoterol (DULERA) 100-5 MCG/ACT inhaler 2 puff (2 puffs Inhalation Not Given 03/19/22 0800)  polyvinyl alcohol (LIQUIFILM TEARS) 1.4 % ophthalmic solution 1 drop (has no administration in time range)  0.9 %  sodium chloride infusion ( Intravenous New Bag/Given 03/19/22 0428)  acetaminophen (TYLENOL) tablet 650 mg (has no administration in time range)    Or  acetaminophen (TYLENOL) suppository 650 mg (has no administration in time range)  polyethylene glycol (MIRALAX / GLYCOLAX) packet 17 g (has no administration in time range)  ondansetron (ZOFRAN) tablet 4 mg (has no administration in time range)    Or  ondansetron (ZOFRAN) injection 4 mg (has no administration in time range)  albuterol (PROVENTIL) (2.5 MG/3ML) 0.083% nebulizer solution 2.5 mg (2.5 mg Nebulization Given 03/19/22 1109)  Warfarin - Pharmacist Dosing Inpatient (has no administration in time range)  allopurinol  (ZYLOPRIM) tablet 100 mg (has no administration in time range)  warfarin (COUMADIN) tablet 1.5 mg (has no administration in time range)  sodium chloride 0.9 % bolus 1,000 mL (0 mLs Intravenous Stopped 03/18/22 2027)  iohexol (OMNIPAQUE) 350 MG/ML injection 100 mL (50 mLs Intravenous Contrast Given 03/18/22 1613)  sodium chloride 0.9 %  bolus 500 mL (0 mLs Intravenous Stopped 03/18/22 2123)   stroke: early stages of recovery book (1 each Does not apply Given 03/19/22 1106)  warfarin (COUMADIN) tablet 2.5 mg (2.5 mg Oral Given 03/18/22 2004)    Mobility walks High fall risk   Focused Assessments Neuro Assessment Handoff:  Swallow screen pass? Yes  Cardiac Rhythm: Normal sinus rhythm NIH Stroke Scale ( + Modified Stroke Scale Criteria)  Interval: Initial Level of Consciousness (1a.)   : Alert, keenly responsive LOC Questions (1b. )   +: Answers both questions correctly LOC Commands (1c. )   + : Performs both tasks correctly Best Gaze (2. )  +: Normal Visual (3. )  +: No visual loss Facial Palsy (4. )    : Normal symmetrical movements Motor Arm, Left (5a. )   +: No drift Motor Arm, Right (5b. )   +: No drift Motor Leg, Left (6a. )   +: No drift Motor Leg, Right (6b. )   +: No drift Limb Ataxia (7. ): Present in two limbs Sensory (8. )   +: Normal, no sensory loss Best Language (9. )   +: No aphasia Dysarthria (10. ): Normal Extinction/Inattention (11.)   +: No Abnormality Modified SS Total  +: 0 Complete NIHSS TOTAL: 4 Last date known well: 03/18/22 Last time known well: 1430 Neuro Assessment: Exceptions to [facility] Neuro Checks:   Initial (03/18/22 1600)  Last Documented NIHSS Modified Score: 0 (03/19/22 0418) Has TPA been given? No If patient is a Neuro Trauma and patient is going to OR before floor call report to [place] nurse: [phone] or [phone]   R Recommendations: See Admitting Provider Note  Report given to:   Additional Notes:

## 2022-03-19 NOTE — ED Notes (Signed)
Ambulated pt to bathroom with assistance and back to bed. Pt resting comfortably in bed.

## 2022-03-19 NOTE — ED Notes (Signed)
Messaged md regarding diet order

## 2022-03-20 ENCOUNTER — Encounter: Payer: Self-pay | Admitting: Internal Medicine

## 2022-03-20 ENCOUNTER — Inpatient Hospital Stay (HOSPITAL_COMMUNITY): Payer: Medicare PPO

## 2022-03-20 DIAGNOSIS — E785 Hyperlipidemia, unspecified: Secondary | ICD-10-CM | POA: Diagnosis not present

## 2022-03-20 DIAGNOSIS — N179 Acute kidney failure, unspecified: Secondary | ICD-10-CM | POA: Diagnosis not present

## 2022-03-20 DIAGNOSIS — I5032 Chronic diastolic (congestive) heart failure: Secondary | ICD-10-CM | POA: Diagnosis not present

## 2022-03-20 DIAGNOSIS — N185 Chronic kidney disease, stage 5: Secondary | ICD-10-CM | POA: Diagnosis not present

## 2022-03-20 LAB — BASIC METABOLIC PANEL
Anion gap: 8 (ref 5–15)
BUN: 25 mg/dL — ABNORMAL HIGH (ref 8–23)
CO2: 24 mmol/L (ref 22–32)
Calcium: 9.6 mg/dL (ref 8.9–10.3)
Chloride: 106 mmol/L (ref 98–111)
Creatinine, Ser: 2.12 mg/dL — ABNORMAL HIGH (ref 0.44–1.00)
GFR, Estimated: 23 mL/min — ABNORMAL LOW (ref >60)
Glucose, Bld: 73 mg/dL (ref 70–99)
Potassium: 4 mmol/L (ref 3.5–5.1)
Sodium: 138 mmol/L (ref 135–145)

## 2022-03-20 LAB — PROTEIN / CREATININE RATIO, URINE
Creatinine, Urine: 74 mg/dL
Protein Creatinine Ratio: 0.08 mg/mg{Cre} (ref 0.00–0.15)
Total Protein, Urine: 6 mg/dL

## 2022-03-20 LAB — CBC
HCT: 30.6 % — ABNORMAL LOW (ref 36.0–46.0)
Hemoglobin: 10 g/dL — ABNORMAL LOW (ref 12.0–15.0)
MCH: 32.4 pg (ref 26.0–34.0)
MCHC: 32.7 g/dL (ref 30.0–36.0)
MCV: 99 fL (ref 80.0–100.0)
Platelets: 84 10*3/uL — ABNORMAL LOW (ref 150–400)
RBC: 3.09 MIL/uL — ABNORMAL LOW (ref 3.87–5.11)
RDW: 13.9 % (ref 11.5–15.5)
WBC: 3.3 10*3/uL — ABNORMAL LOW (ref 4.0–10.5)
nRBC: 0 % (ref 0.0–0.2)

## 2022-03-20 LAB — PROTIME-INR
INR: 2.7 — ABNORMAL HIGH (ref 0.8–1.2)
Prothrombin Time: 28.1 seconds — ABNORMAL HIGH (ref 11.4–15.2)

## 2022-03-20 LAB — SODIUM, URINE, RANDOM: Sodium, Ur: 28 mmol/L

## 2022-03-20 MED ORDER — ADULT MULTIVITAMIN W/MINERALS CH
1.0000 | ORAL_TABLET | Freq: Every day | ORAL | Status: DC
Start: 2022-03-20 — End: 2022-03-21
  Administered 2022-03-20 – 2022-03-21 (×2): 1 via ORAL
  Filled 2022-03-20 (×2): qty 1

## 2022-03-20 MED ORDER — WARFARIN SODIUM 2.5 MG PO TABS
2.5000 mg | ORAL_TABLET | Freq: Once | ORAL | Status: AC
  Administered 2022-03-20: 2.5 mg via ORAL
  Filled 2022-03-20: qty 1

## 2022-03-20 MED ORDER — ENSURE ENLIVE PO LIQD: 237.0000 mL | Freq: Two times a day (BID) | ORAL | Status: DC

## 2022-03-20 NOTE — Consult Note (Signed)
[facility]  INPATIENT CONSULTATION  Reason for Consultation: AKI on CKD Requesting Provider: Dr. Bonner Puna  HPI: Holly Simmons is an 81 y.o. female with HFpEF (h/o HOCM s/p myomectomy), bioprosthetic TV, atrial fibrillation, HL, CKD 3 currently admitted with weakness and nephrology si consulted for evaluation and management of AKI on CKD.   Presented to ED 8/15 with generalized weakness and a near syncopal episode complicated by transient aphasia.  She had decreased po intake in the weeks/days prior to the episode reported by family.  Head CT neg, neurology consulted - not thought to be CVA.  Creatinine initially 2.5 > 2.8 day of presentation, 2.1 yesterday and 2.1 today.  Baseline is in the 1.6-1.'9mg'$ /dL range.  She is nonoliguric - UOP as of this afternoon 765m today, net + 0.2L for admission.  She has been normotensive in the 120-140 range.   She is seen today with daughter bedside and son on phone; lives with daughter.  She has a chronic h/o of mild difficulty swallowing, not recently worse.  They related the decreased po intake to increased depression associated with 1 year anniversary of her husband's passing and she agrees.  She saw PCP recently who is aware and added abilify to her meds but after reading side effects family was concerned and did not start med.  Pt currently feeling well and denies any LUTs, edema, new symptoms.   [facility] and was stable.  Torsemide 20 daily was continued at that time; stable dose x 132yreported, has h/o CHF and required higher doses.   PMH: Past Medical History:  Diagnosis Date   Allergic rhinitis    Asthma    NOS w/ acute exacerbation   Blood transfusion without reported diagnosis    CKD (chronic kidney disease) stage 3, GFR 30-59 ml/min (HCC) 09/12/2018   Colon polyps    TUBULAR ADENOMAS AND HYPERPLASTIC   Complete heart block (HCC)    requiring PPM (MDT) [facility] with abdominal implant, high ventricular threshold at implant   COPD (chronic obstructive pulmonary disease) (HCC)    Depressive disorder    Diastolic dysfunction    DM (diabetes mellitus) (HCS[place]   Gallstones    GERD (gastroesophageal reflux disease)    Gout 08/20/2012   Heart murmur    Hyperpotassemia    Hypersomnia    Hypertension    Hypertrophic cardiomyopathy (HC[facility] [place]   [facility] by septal VSD post procedure requiring reoperation with patch closure and tricuspid valve replacement   Kidney stones    Myocardial infarction (HCPoqu[place]on2011   Obstructive sleep apnea    persistent daytime sleepiness despite cpap   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   Psoriasis    Pyuria    Renal insufficiency    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   Typical atrial flutter (HCCRut[place]/15   PSH: Past Surgical History:  Procedure Laterality Date   ABDOMINAL HYSTERECTOMY     APPENDECTOMY     BREAST CYST EXCISION     CARDIOVERSION N/A 08/01/2014   Procedure: CARDIOVERSION;  Surgeon: PhiThayer HeadingsD;  [facility];  Laterality: N/A;   CESAREAN SECTION     CHOLECYSTECTOMY     COLONOSCOPY     EYE SURGERY  [id number] eye lens implant   EYE SURGERY  [id number]  eye lens implant   INSERT / REPLACE / REMOVE PACEMAKER  07/13/2012   PACEMAKER INSERTION  07/02/11   epicardial wires with abdominal implant at [facility] 12/12,  high ventricular lead threshold at implant per Dr Westley Gambles   PERMANENT PACEMAKER INSERTION N/A 07/13/2012   Procedure: PERMANENT PACEMAKER INSERTION;  Surgeon: Thompson Grayer, MD;  Location: [facility] CATH LAB;  Service: Cardiovascular;  Laterality: N/A;   POLYPECTOMY     septal myomectomy for hypertrophic CM  06/23/11   by Dr Evelina Dun [facility], complicated by septal VSD requiring patch repair and tricuspid valve replacement   TONSILLECTOMY     TRICUSPID VALVE REPLACEMENT  11/12   Medtronic  46m Mosaic tissue valve   VSD REPAIR  06/2011     Past Medical History:  Diagnosis Date   Allergic rhinitis    Asthma    NOS w/ acute exacerbation   Blood transfusion without reported diagnosis    CKD (chronic kidney disease) stage 3, GFR 30-59 ml/min (HCC) 09/12/2018   Colon polyps    TUBULAR ADENOMAS AND HYPERPLASTIC   Complete heart block (HCC)    [facility]MDT[facility]  leads are epicardial with abdominal implant, high ventricular threshold at implant   COPD (chronic obstructive pulmonary disease) (HCC)    Depressive disorder    Diastolic dysfunction    DM (diabetes mellitus) (HLequ[place]    Gallstones    GERD (gastroesophageal reflux disease)    Gout 08/20/2012   Heart murmur    Hyperpotassemia    Hypersomnia    Hypertension    Hypertrophic cardiomyopathy (HCraigs[place]    [facility]plicated by septal VSD post procedure requiring reoperation with patch closure and tricuspid valve replacement   Kidney stones    Myocardial infarction (HOzan 2011   Obstructive sleep apnea    persistent daytime sleepiness despite cpap   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   Psoriasis    Pyuria    Renal insufficiency    Tricuspid valve replaced    MDT 272mMosaic Valve   Typical atrial flutter (HCCanon City9/15    Medications:  I have reviewed the patient's current medications.  Medications Prior to Admission  Medication Sig Dispense Refill   allopurinol (ZYLOPRIM) 100 MG tablet Take 1 tablet (100 mg total) by mouth daily. 90 tablet 1   amoxicillin (AMOXIL) 500 MG capsule TAKE 4 CAPSULES 1 HOUR PRIOR TO PROCEDURE (Patient taking differently: Take 2,000 mg by mouth See admin instructions. Take 4 capsules 1 hour prior to dental appointment) 30 capsule 0   buPROPion (WELLBUTRIN XL) 300 MG 24 hr tablet TAKE ONE TABLET BY MOUTH ONCE DAILY 90 tablet 0   dronabinol (MARINOL) 5 MG capsule Take 1 capsule (5 mg total) by mouth 2 (two) times daily before lunch and supper.  180 capsule 1   famotidine (PEPCID) 40 MG tablet Take 1 tablet (40 mg total) by mouth daily. 90 tablet 1   ferrous sulfate 325 (65 FE) MG tablet Take 1 tablet (325 mg total) by mouth 2 (two) times daily with a meal. (Patient taking differently: Take 325-650 mg by mouth daily with breakfast.) 180 tablet 1   ipratropium (ATROVENT) 0.03 % nasal spray USE 2 SPRAYS IN EACH NOSTRIL 3 TIMES A DAY AS NEEDED. (Patient taking differently: Place 2 sprays into both nostrils daily as needed (nose bleed).) 30 mL 0   lamoTRIgine (LAMICTAL) 25 MG tablet TAKE ONE TABLET BY MOUTH ONCE DAILY 90 tablet 0   metoprolol tartrate (  LOPRESSOR) 25 MG tablet Take 1 tablet (25 mg total) by mouth 2 (two) times daily. 180 tablet 0   modafinil (PROVIGIL) 100 MG tablet Take 1 tablet (100 mg total) by mouth daily. (Patient taking differently: Take 100 mg by mouth daily as needed (sleep disorder).) 30 tablet 5   PRESCRIPTION MEDICATION 1 Dose by Other route at bedtime as needed (sleep). CPAP     Propylene Glycol (SYSTANE BALANCE OP) Place 1 drop into both eyes 4 (four) times daily as needed (dry eyes).     SYMBICORT 80-4.5 MCG/ACT inhaler USE 2 PUFFS TWICE A DAY. (Patient taking differently: 2 puffs 2 (two) times daily.) 30.6 g 5   torsemide (DEMADEX) 20 MG tablet Take 1 tablet (20 mg total) by mouth daily. 90 tablet 0   Vilazodone HCl (VIIBRYD) 40 MG TABS Take 1 tablet (40 mg total) by mouth daily. 90 tablet 1   warfarin (COUMADIN) 1 MG tablet Take 1 tablet (1 mg total) by mouth daily. TAKE 1 1/2 TABLET EVERY WEDNESDAY OR AS DIRECTED BY ANTICOAGULATION LCINIC (Patient taking differently: Take 1.5 mg by mouth See admin instructions. TAKE 1 1/2 TABLET EVERY WEDNESDAY OR AS DIRECTED BY ANTICOAGULATION LCINIC) 20 tablet 0   warfarin (COUMADIN) 2.5 MG tablet TAKE 1 TABLET BY MOUTH DAILY EXCEPT NONE ON WEDNESDAYS OR AS DIRECTED BY ANTICOAGULATION CLINIC (Patient taking differently: Take 2.5 mg by mouth daily. TAKE 1 TABLET BY MOUTH DAILY  EXCEPT ON WEDNESDAYS) 40 tablet 1   ARIPiprazole (ABILIFY) 2 MG tablet Take 1 tablet (2 mg total) by mouth daily. (Patient not taking: Reported on 03/18/2022) 90 tablet 0   Betamethasone Valerate 0.12 % foam APPLY TO SCALP AS DIRECTED. (Patient taking differently: 1 application  once a week.) 100 g 2   Pitavastatin Calcium 2 MG TABS Take 1 tablet (2 mg total) by mouth daily. (Patient not taking: Reported on 03/18/2022) 90 tablet 1    ALLERGIES:   Allergies  Allergen Reactions   Petrolatum-Zinc Oxide Anaphylaxis, Rash and Swelling   Sulfa Antibiotics Rash   Sulfonamide Derivatives Anaphylaxis and Swelling   Tetanus Toxoid     Other reaction(s): Other (See Comments) OTHER REACTION   Tetanus Toxoids Swelling   Other Rash and Other (See Comments)    STERI - STRIPS  It can affect proteins in her kidneys so she doesn't take REACTION: proteinuria Other reaction(s): Other (See Comments), Unknown It can affect proteins in her kidneys so she doesn't take   Nsaids Other (See Comments)    REACTION: proteinuria Other reaction(s): Other (See Comments), Unknown It can affect proteins in her kidneys so she doesn't take    Tetanus Toxoid, Adsorbed Other (See Comments)    Other reaction(s): Other (See Comments) Turned arm red Turned arm red    FAM HX: Family History  Problem Relation Age of Onset   Hypertension Daughter    Heart disease Maternal Grandfather    Heart disease Paternal Grandfather    Bladder Cancer Father    Prostate cancer Father    Cancer Father    Varicose Veins Father    Heart attack Father    Breast cancer Mother    Dementia Mother    Hypertension Mother    Cancer Mother    Ovarian cancer Maternal Aunt    Pancreatic cancer Cousin    Breast cancer Paternal Aunt    Dementia Maternal Aunt        x 2   Dementia Maternal Uncle  x 2   Colon cancer Neg Hx    Rectal cancer Neg Hx    Stomach cancer Neg Hx    Esophageal cancer Neg Hx     Social History:    reports that she has never smoked. She has never used smokeless tobacco. She reports that she does not drink alcohol and does not use drugs.  ROS: 12 system ROS neg except per HPI   Blood pressure 134/62, pulse 71, temperature 97.7 F (36.5 C), temperature source Axillary, resp. rate 17, height 5' 2.99" (1.6 m), weight 71.4 kg, SpO2 93 %. PHYSICAL EXAM: Gen: lying comfortably at 30 deg in bed  Eyes: anicteric ENT: MMM CV:  RRR Abd: soft, nontender Lungs: clear, normal WOB on RA GU: no foley Extr:  no edema Neuro: grossly nonfocal, seems a bit slow to speak Skin: no rashes   Results for orders placed or performed during the hospital encounter of 03/18/22 (from the past 48 hour(s))  Resp Panel by RT-PCR (Flu A&B, Covid) Anterior Nasal Swab     Status: None   Collection Time: 03/18/22  3:55 PM   Specimen: Anterior Nasal Swab  Result Value Ref Range   SARS Coronavirus 2 by RT PCR NEGATIVE NEGATIVE    Comment: (NOTE) SARS-CoV-2 target nucleic acids are NOT DETECTED.  The SARS-CoV-2 RNA is generally detectable in upper respiratory specimens during the acute phase of infection. The lowest concentration of SARS-CoV-2 viral copies this assay can detect is 138 copies/mL. A negative result does not preclude SARS-Cov-2 infection and should not be used as the sole basis for treatment or other patient management decisions. A negative result may occur with  improper specimen collection/handling, submission of specimen other than nasopharyngeal swab, presence of viral mutation(s) within the areas targeted by this assay, and inadequate number of viral copies(<138 copies/mL). A negative result must be combined with clinical observations, patient history, and epidemiological information. The expected result is Negative.  Fact Sheet for Patients:  EntrepreneurPulse.com.au  Fact Sheet for Healthcare Providers:  IncredibleEmployment.be  This test is no t yet  approved or cleared by the [place] FDA and  has been authorized for detection and/or diagnosis of SARS-CoV-2 by FDA under an Emergency Use Authorization (EUA). This EUA will remain  in effect (meaning this test can be used) for the duration of the COVID-19 declaration under Section 564(b)(1) of the Act, 21 U.S.C.section 360bbb-3(b)(1), unless the authorization is terminated  or revoked sooner.       Influenza A by PCR NEGATIVE NEGATIVE   Influenza B by PCR NEGATIVE NEGATIVE    Comment: (NOTE) The Xpert Xpress SARS-CoV-2/FLU/RSV plus assay is intended as an aid in the diagnosis of influenza from Nasopharyngeal swab specimens and should not be used as a sole basis for treatment. Nasal washings and aspirates are unacceptable for Xpert Xpress SARS-CoV-2/FLU/RSV testing.  Fact Sheet for Patients: EntrepreneurPulse.com.au  Fact Sheet for Healthcare Providers: IncredibleEmployment.be  This test is not yet approved or cleared by the [place] FDA and has been authorized for detection and/or diagnosis of SARS-CoV-2 by FDA under an Emergency Use Authorization (EUA). This EUA will remain in effect (meaning this test can be used) for the duration of the COVID-19 declaration under Section 564(b)(1) of the Act, 21 U.S.C. section 360bbb-3(b)(1), unless the authorization is terminated or revoked.  Performed at [facility], [place] [address] [place], [place] [address]   Protime-INR     Status: Abnormal   Collection Time: 03/18/22  3:55 PM  Result Value Ref Range   Prothrombin Time 26.9 (H) 11.4 - 15.2 seconds   INR 2.5 (H) 0.8 - 1.2    Comment: (NOTE) INR goal varies based on device and disease states. Performed at [facility], [place] [address] [place] [place], [place] [address]   APTT     Status: Abnormal   Collection Time: 03/18/22  3:55 PM  Result Value Ref Range   aPTT 38 (H) 24 - 36 seconds    Comment:        IF BASELINE aPTT IS  ELEVATED, SUGGEST PATIENT RISK ASSESSMENT BE USED TO DETERMINE APPROPRIATE ANTICOAGULANT THERAPY. Performed at [facility], [place] [address], [place], [place] [address]   CBC     Status: Abnormal   Collection Time: 03/18/22  3:55 PM  Result Value Ref Range   WBC 4.6 4.0 - 10.5 K/uL   RBC 3.60 (L) 3.87 - 5.11 MIL/uL   Hemoglobin 11.5 (L) 12.0 - 15.0 g/dL   HCT 36.2 36.0 - 46.0 %   MCV 100.6 (H) 80.0 - 100.0 fL   MCH 31.9 26.0 - 34.0 pg   MCHC 31.8 30.0 - 36.0 g/dL   RDW 14.2 11.5 - 15.5 %   Platelets 131 (L) 150 - 400 K/uL   nRBC 0.0 0.0 - 0.2 %    Comment: Performed at [facility], [place] [address], [place], [place] [address]  Differential     Status: Abnormal   Collection Time: 03/18/22  3:55 PM  Result Value Ref Range   Neutrophils Relative % 71 %   Neutro Abs 3.3 1.7 - 7.7 K/uL   Lymphocytes Relative 10 %   Lymphs Abs 0.4 (L) 0.7 - 4.0 K/uL   Monocytes Relative 13 %   Monocytes Absolute 0.6 0.1 - 1.0 K/uL   Eosinophils Relative 4 %   Eosinophils Absolute 0.2 0.0 - 0.5 K/uL   Basophils Relative 1 %   Basophils Absolute 0.0 0.0 - 0.1 K/uL   Immature Granulocytes 1 %   Abs Immature Granulocytes 0.05 0.00 - 0.07 K/uL    Comment: Performed at [place] [address] [place], [place] [address]  Comprehensive metabolic panel     Status: Abnormal   Collection Time: 03/18/22  3:55 PM  Result Value Ref Range   Sodium 137 135 - 145 mmol/L   Potassium 4.3 3.5 - 5.1 mmol/L   Chloride 99 98 - 111 mmol/L   CO2 30 22 - 32 mmol/L   Glucose, Bld 120 (H) 70 - 99 mg/dL    Comment: Glucose reference range applies only to samples taken after fasting for at least 8 hours.   BUN 28 (H) 8 - 23 mg/dL   Creatinine, Ser 2.48 (H) 0.44 - 1.00 mg/dL   Calcium 10.1 8.9 - 10.3 mg/dL   Total Protein 6.8 6.5 - 8.1 g/dL   Albumin 3.8 3.5 - 5.0 g/dL   AST 16 15 - 41 U/L   ALT 12 0 - 44 U/L   Alkaline Phosphatase 131 (H) 38 - 126 U/L   Total Bilirubin 0.9 0.3 - 1.2 mg/dL    GFR, Estimated 19 (L) >60 mL/min    Comment: (NOTE) Calculated using the CKD-EPI Creatinine Equation (2021)    Anion gap 8 5 - 15    Comment: Performed at [facility] [address] [place], [place] [address]  Troponin I (High Sensitivity)     Status: None   Collection Time:  03/18/22  3:55 PM  Result Value Ref Range   Troponin I (High Sensitivity) 15 <18 ng/L    Comment: (NOTE) Elevated high sensitivity troponin I (hsTnI) values and significant  changes across serial measurements may suggest ACS but many other  chronic and acute conditions are known to elevate hsTnI results.  Refer to the "Links" section for chest pain algorithms and additional  guidance. Performed at [facility], [place] [address], [place], [place] [address]   CBG monitoring, ED     Status: Abnormal   Collection Time: 03/18/22  3:55 PM  Result Value Ref Range   Glucose-Capillary 120 (H) 70 - 99 mg/dL    Comment: Glucose reference range applies only to samples taken after fasting for at least 8 hours.  Ethanol     Status: None   Collection Time: 03/18/22  4:00 PM  Result Value Ref Range   Alcohol, Ethyl (B) <10 <10 mg/dL    Comment: (NOTE) Lowest detectable limit for serum alcohol is 10 mg/dL.  For medical purposes only. Performed at [facility], [place] [address] [place], [place] [address]   I-stat chem 8, ED     Status: Abnormal   Collection Time: 03/18/22  4:01 PM  Result Value Ref Range   Sodium 139 135 - 145 mmol/L   Potassium 4.4 3.5 - 5.1 mmol/L   Chloride 97 (L) 98 - 111 mmol/L   BUN 31 (H) 8 - 23 mg/dL   Creatinine, Ser 2.70 (H) 0.44 - 1.00 mg/dL   Glucose, Bld 112 (H) 70 - 99 mg/dL    Comment: Glucose reference range applies only to samples taken after fasting for at least 8 hours.   Calcium, Ion 1.19 1.15 - 1.40 mmol/L   TCO2 30 22 - 32 mmol/L   Hemoglobin 12.2 12.0 - 15.0 g/dL   HCT 36.0 36.0 - 46.0 %  Urine rapid drug screen (hosp performed)     Status: Abnormal    Collection Time: 03/18/22  6:00 PM  Result Value Ref Range   Opiates NONE DETECTED NONE DETECTED   Cocaine NONE DETECTED NONE DETECTED   Benzodiazepines NONE DETECTED NONE DETECTED   Amphetamines NONE DETECTED NONE DETECTED   Tetrahydrocannabinol POSITIVE (A) NONE DETECTED   Barbiturates NONE DETECTED NONE DETECTED    Comment: (NOTE) DRUG SCREEN FOR MEDICAL PURPOSES ONLY.  IF CONFIRMATION IS NEEDED FOR ANY PURPOSE, NOTIFY LAB WITHIN 5 DAYS.  LOWEST DETECTABLE LIMITS FOR URINE DRUG SCREEN Drug Class                     Cutoff (ng/mL) Amphetamine and metabolites    1000 Barbiturate and metabolites    200 Benzodiazepine                 657 Tricyclics and metabolites     300 Opiates and metabolites        300 Cocaine and metabolites        300 THC                            50 Performed at [facility], [place] [address] [place], [place] [address]   Urinalysis, Routine w reflex microscopic     Status: Abnormal   Collection Time: 03/18/22  6:00 PM  Result Value Ref Range   Color, Urine STRAW (A) YELLOW   APPearance CLEAR CLEAR   Specific Gravity, Urine 1.012 1.005 -  1.030   pH 7.0 5.0 - 8.0   Glucose, UA NEGATIVE NEGATIVE mg/dL   Hgb urine dipstick NEGATIVE NEGATIVE   Bilirubin Urine NEGATIVE NEGATIVE   Ketones, ur NEGATIVE NEGATIVE mg/dL   Protein, ur NEGATIVE NEGATIVE mg/dL   Nitrite NEGATIVE NEGATIVE   Leukocytes,Ua SMALL (A) NEGATIVE   WBC, UA 0-5 0 - 5 WBC/hpf   Bacteria, UA RARE (A) NONE SEEN   Squamous Epithelial / LPF 0-5 0 - 5   Mucus PRESENT    Hyaline Casts, UA PRESENT     Comment: Performed at [facility], [place] [address] [place], [place] [address]  Troponin I (High Sensitivity)     Status: None   Collection Time: 03/18/22  6:19 PM  Result Value Ref Range   Troponin I (High Sensitivity) 13 <18 ng/L    Comment: (NOTE) Elevated high sensitivity troponin I (hsTnI) values and significant  changes across serial measurements may suggest ACS but many  other  chronic and acute conditions are known to elevate hsTnI results.  Refer to the "Links" section for chest pain algorithms and additional  guidance. Performed at [facility], [place] [address], [place], [place] [address]   Magnesium     Status: None   Collection Time: 03/18/22  8:24 PM  Result Value Ref Range   Magnesium 2.0 1.7 - 2.4 mg/dL    Comment: Performed at [facility], [place] [address], [place], [place] [address]  Hemoglobin A1c     Status: None   Collection Time: 03/18/22  8:24 PM  Result Value Ref Range   Hgb A1c MFr Bld 5.3 4.8 - 5.6 %    Comment: (NOTE) Pre diabetes:          5.7%-6.4%  Diabetes:              >6.4%  Glycemic control for   <7.0% adults with diabetes    Mean Plasma Glucose 105.41 mg/dL    Comment: Performed at [facility] [address], [place], [place] [address]  Protime-INR     Status: Abnormal   Collection Time: 03/19/22 12:42 AM  Result Value Ref Range   Prothrombin Time 28.8 (H) 11.4 - 15.2 seconds   INR 2.8 (H) 0.8 - 1.2    Comment: (NOTE) INR goal varies based on device and disease states. Performed at [facility], [place] [address] [place], [place] [address]   CBC     Status: Abnormal   Collection Time: 03/19/22 12:42 AM  Result Value Ref Range   WBC 3.4 (L) 4.0 - 10.5 K/uL   RBC 3.08 (L) 3.87 - 5.11 MIL/uL   Hemoglobin 10.1 (L) 12.0 - 15.0 g/dL   HCT 31.1 (L) 36.0 - 46.0 %   MCV 101.0 (H) 80.0 - 100.0 fL   MCH 32.8 26.0 - 34.0 pg   MCHC 32.5 30.0 - 36.0 g/dL   RDW 14.2 11.5 - 15.5 %   Platelets 102 (L) 150 - 400 K/uL   nRBC 0.0 0.0 - 0.2 %    Comment: Performed at [facility], [place] [address], [place], [place] [address]  Basic metabolic panel     Status: Abnormal   Collection Time: 03/19/22 12:42 AM  Result Value Ref Range   Sodium 138 135 - 145 mmol/L   Potassium 4.0 3.5 - 5.1 mmol/L   Chloride 105 98 - 111 mmol/L   CO2 26 22 - 32 mmol/L   Glucose,  Bld 84 70 - 99 mg/dL    Comment: Glucose  reference range applies only to samples taken after fasting for at least 8 hours.   BUN 27 (H) 8 - 23 mg/dL   Creatinine, Ser 2.13 (H) 0.44 - 1.00 mg/dL   Calcium 9.3 8.9 - 10.3 mg/dL   GFR, Estimated 23 (L) >60 mL/min    Comment: (NOTE) Calculated using the CKD-EPI Creatinine Equation (2021)    Anion gap 7 5 - 15    Comment: Performed at [facility] [address] [place], [place] [address]  Lipid panel     Status: None   Collection Time: 03/19/22 12:42 AM  Result Value Ref Range   Cholesterol 129 0 - 200 mg/dL   Triglycerides 40 <150 mg/dL   HDL 47 >40 mg/dL   Total CHOL/HDL Ratio 2.7 RATIO   VLDL 8 0 - 40 mg/dL   LDL Cholesterol 74 0 - 99 mg/dL    Comment:        Total Cholesterol/HDL:CHD Risk Coronary Heart Disease Risk Table                     Men   Women  1/2 Average Risk   3.4   3.3  Average Risk       5.0   4.4  2 X Average Risk   9.6   7.1  3 X Average Risk  23.4   11.0        Use the calculated Patient Ratio above and the CHD Risk Table to determine the patient's CHD Risk.        ATP III CLASSIFICATION (LDL):  <100     mg/dL   Optimal  100-129  mg/dL   Near or Above                    Optimal  130-159  mg/dL   Borderline  160-189  mg/dL   High  >190     mg/dL   Very High Performed at [facility] [address] [place], [place] [address]   Protime-INR     Status: Abnormal   Collection Time: 03/20/22  4:34 AM  Result Value Ref Range   Prothrombin Time 28.1 (H) 11.4 - 15.2 seconds   INR 2.7 (H) 0.8 - 1.2    Comment: (NOTE) INR goal varies based on device and disease states. Performed at [facility], [place] [address] [place], [place] [address]   Basic metabolic panel     Status: Abnormal   Collection Time: 03/20/22  4:34 AM  Result Value Ref Range   Sodium 138 135 - 145 mmol/L   Potassium 4.0 3.5 - 5.1 mmol/L   Chloride 106 98 - 111 mmol/L   CO2 24 22 - 32 mmol/L   Glucose, Bld 73 70 - 99 mg/dL    Comment: Glucose reference  range applies only to samples taken after fasting for at least 8 hours.   BUN 25 (H) 8 - 23 mg/dL   Creatinine, Ser 2.12 (H) 0.44 - 1.00 mg/dL   Calcium 9.6 8.9 - 10.3 mg/dL   GFR, Estimated 23 (L) >60 mL/min    Comment: (NOTE) Calculated using the CKD-EPI Creatinine Equation (2021)    Anion gap 8 5 - 15    Comment: Performed at [facility] [address], [place], [place] [address]  CBC     Status: Abnormal   Collection Time:  03/20/22  4:34 AM  Result Value Ref Range   WBC 3.3 (L) 4.0 - 10.5 K/uL   RBC 3.09 (L) 3.87 - 5.11 MIL/uL   Hemoglobin 10.0 (L) 12.0 - 15.0 g/dL   HCT 30.6 (L) 36.0 - 46.0 %   MCV 99.0 80.0 - 100.0 fL   MCH 32.4 26.0 - 34.0 pg   MCHC 32.7 30.0 - 36.0 g/dL   RDW 13.9 11.5 - 15.5 %   Platelets 84 (L) 150 - 400 K/uL    Comment: Immature Platelet Fraction may be clinically indicated, consider ordering this additional test [id number] REPEATED TO VERIFY    nRBC 0.0 0.0 - 0.2 %    Comment: Performed at [facility], [place] [address], [place], [place] [address]   *Note: Due to a large number of results and/or encounters for the requested time period, some results have not been displayed. A complete set of results can be found in Results Review.    US RENAL  Result Date: 03/20/2022 CLINICAL DATA:  Acute kidney injury. History diabetes mellitus, hypertension, stage III chronic kidney disease EXAM: RENAL / URINARY TRACT ULTRASOUND COMPLETE COMPARISON:  11/01/2019 FINDINGS: Right Kidney: Renal measurements: 11.2 x 5.3 x 4.8 cm = volume: 148 mL. Cortical thinning. Increased cortical echogenicity. No mass, hydronephrosis, or shadowing calcification. Left Kidney: Renal measurements: 9.7 x 5.9 x 5.1 cm = volume: 154 mL. Cortical thinning. Increased cortical echogenicity. No mass, hydronephrosis, or shadowing calcification. Bladder: Appears normal for degree of bladder distention. Other: N/A IMPRESSION: Cortical atrophy and medical renal disease changes of both  kidneys. No evidence of renal mass or hydronephrosis. Electronically Signed   By: Lavonia Dana M.D.   On: 03/20/2022 08:44   CT HEAD WO CONTRAST (5MM)  Result Date: 03/19/2022 CLINICAL DATA:  Neuro deficit, stroke suspected, acute metabolic encephalopathy on chronic cognitive impairment EXAM: CT HEAD WITHOUT CONTRAST TECHNIQUE: Contiguous axial images were obtained from the base of the skull through the vertex without intravenous contrast. RADIATION DOSE REDUCTION: This exam was performed according to the departmental dose-optimization program which includes automated exposure control, adjustment of the mA and/or kV according to patient size and/or use of iterative reconstruction technique. COMPARISON:  03/18/2022 FINDINGS: Brain: No evidence of acute infarction, hemorrhage, hydrocephalus, extra-axial collection or mass lesion/mass effect. Periventricular and deep white hypodensity. Vascular: No hyperdense vessel or unexpected calcification. Skull: Normal. Negative for fracture or focal lesion. Sinuses/Orbits: No acute finding. Other: None. IMPRESSION: No acute intracranial pathology. Small-vessel white matter disease. Electronically Signed   By: Delanna Ahmadi M.D.   On: 03/19/2022 18:08   ECHOCARDIOGRAM COMPLETE  Result Date: 03/19/2022    ECHOCARDIOGRAM REPORT   Patient Name:   LUIS SAMI Date of Exam: 03/19/2022 Medical Rec #:  [id number]            Height:       63.0 in Accession #:    [id number]           Weight:       156.5 lb Date of Birth:  1941/01/21           BSA:          1.742 m Patient Age:    69 years             BP:           143/63 mmHg Patient Gender: F  HR:           68 bpm. Exam Location:  Inpatient Procedure: 2D Echo, Cardiac Doppler and Color Doppler Indications:    Murmur  History:        Patient has prior history of Echocardiogram examinations, most                 recent 06/14/2021. COPD; Risk Factors:Hypertension, Diabetes and                 Sleep Apnea.   Sonographer:    Jefferey Pica Referring Phys: [employer]  1. Left ventricular ejection fraction, by estimation, is 50 to 55%. The left ventricle has low normal function. The left ventricle has no regional wall motion abnormalities. There is mild asymmetric left ventricular hypertrophy of the posterior wall segment. Left ventricular diastolic parameters are indeterminate.  2. Right ventricular systolic function is normal. The right ventricular size is normal. There is moderately elevated pulmonary artery systolic pressure.  3. Left atrial size was mildly dilated.  4. Right atrial size was severely dilated.  5. The mitral valve is degenerative. No evidence of mitral valve regurgitation. Mild mitral stenosis. Moderate mitral annular calcification.  6. Bioprosthetic tricuspid valve. TV mean gradient 7.7 mmHg suggesting stenosis of the the bioprosthetic valve.  7. The aortic valve is calcified. Aortic valve regurgitation is mild. Mild aortic valve stenosis. Aortic regurgitation PHT measures 377 msec. Aortic valve mean gradient measures 9.0 mmHg. Aortic valve Vmax measures 2.08 m/s.  8. The inferior vena cava is normal in size with greater than 50% respiratory variability, suggesting right atrial pressure of 3 mmHg. FINDINGS  Left Ventricle: Left ventricular ejection fraction, by estimation, is 50 to 55%. The left ventricle has low normal function. The left ventricle has no regional wall motion abnormalities. The left ventricular internal cavity size was normal in size. There is mild asymmetric left ventricular hypertrophy of the posterior wall segment. Left ventricular diastolic parameters are indeterminate. Right Ventricle: The right ventricular size is normal. No increase in right ventricular wall thickness. Right ventricular systolic function is normal. There is moderately elevated pulmonary artery systolic pressure. The tricuspid regurgitant velocity is 2.76 m/s, and with an assumed right  atrial pressure of 15 mmHg, the estimated right ventricular systolic pressure is 91.4 mmHg. Left Atrium: Left atrial size was mildly dilated. Right Atrium: Right atrial size was severely dilated. Pericardium: There is no evidence of pericardial effusion. Mitral Valve: The mitral valve is degenerative in appearance. Moderate mitral annular calcification. No evidence of mitral valve regurgitation. Mild mitral valve stenosis. Tricuspid Valve: Bioprosthetic tricuspid valve. TV mean gradient 7.7 mmHg suggesting stenosis of the the bioprosthetic valve. Tricuspid valve regurgitation is not demonstrated. Aortic Valve: The aortic valve is calcified. Aortic valve regurgitation is mild. Aortic regurgitation PHT measures 377 msec. Mild aortic stenosis is present. Aortic valve mean gradient measures 9.0 mmHg. Aortic valve peak gradient measures 17.4 mmHg. Aortic valve area, by VTI measures 2.87 cm. Pulmonic Valve: The pulmonic valve was not well visualized. Pulmonic valve regurgitation is not visualized. No evidence of pulmonic stenosis. Aorta: The aortic root is normal in size and structure. Venous: The inferior vena cava is normal in size with greater than 50% respiratory variability, suggesting right atrial pressure of 3 mmHg. IAS/Shunts: No atrial level shunt detected by color flow Doppler.  LEFT VENTRICLE PLAX 2D LVIDd:         5.00 cm   Diastology LVIDs:  3.80 cm   LV e' medial:    3.09 cm/s LV PW:         1.10 cm   LV E/e' medial:  39.5 LV IVS:        0.80 cm   LV e' lateral:   5.06 cm/s LVOT diam:     2.00 cm   LV E/e' lateral: 24.1 LV SV:         135 LV SV Index:   78 LVOT Area:     3.14 cm  IVC IVC diam: 2.50 cm LEFT ATRIUM             Index        RIGHT ATRIUM           Index LA diam:        4.30 cm 2.47 cm/m   RA Area:     23.40 cm LA Vol (A2C):   59.4 ml 34.09 ml/m  RA Volume:   80.30 ml  46.09 ml/m LA Vol (A4C):   66.8 ml 38.34 ml/m LA Biplane Vol: 64.5 ml 37.02 ml/m  AORTIC VALVE                      PULMONIC VALVE AV Area (Vmax):    3.09 cm      PV Vmax:       0.88 m/s AV Area (Vmean):   2.89 cm      PV Peak grad:  3.1 mmHg AV Area (VTI):     2.87 cm AV Vmax:           208.50 cm/s AV Vmean:          137.000 cm/s AV VTI:            0.471 m AV Peak Grad:      17.4 mmHg AV Mean Grad:      9.0 mmHg LVOT Vmax:         205.00 cm/s LVOT Vmean:        126.000 cm/s LVOT VTI:          0.431 m LVOT/AV VTI ratio: 0.92 AI PHT:            377 msec  AORTA Ao Root diam: 3.30 cm Ao Asc diam:  2.70 cm MITRAL VALVE                TRICUSPID VALVE MV Area (PHT): 3.34 cm     TV Peak grad:   12.1 mmHg MV Decel Time: 227 msec     TV Mean grad:   7.7 mmHg MV E velocity: 122.00 cm/s  TV Vmax:        1.74 m/s MV A velocity: 94.70 cm/s   TV Vmean:       132.0 cm/s MV E/A ratio:  1.29         TV VTI:         0.83 msec                             TR Peak grad:   30.5 mmHg                             TR Vmax:        276.00 cm/s  SHUNTS                             Systemic VTI:  0.43 m                             Systemic Diam: 2.00 cm Kardie Tobb DO Electronically signed by Berniece Salines DO Signature Date/Time: 03/19/2022/4:05:25 PM    Final    CT HEAD WO CONTRAST  Result Date: 03/18/2022 CLINICAL DATA:  Recent syncopal episode EXAM: CT HEAD WITHOUT CONTRAST TECHNIQUE: Contiguous axial images were obtained from the base of the skull through the vertex without intravenous contrast. RADIATION DOSE REDUCTION: This exam was performed according to the departmental dose-optimization program which includes automated exposure control, adjustment of the mA and/or kV according to patient size and/or use of iterative reconstruction technique. COMPARISON:  CT from earlier in the same day. FINDINGS: Brain: No evidence of acute infarction, hemorrhage, hydrocephalus, extra-axial collection or mass lesion/mass effect. Chronic ischemic changes are again noted and stable. Vascular: No hyperdense vessel or unexpected  calcification. Skull: Normal. Negative for fracture or focal lesion. Sinuses/Orbits: No acute finding. Other: None. IMPRESSION: Chronic ischemic changes without acute abnormality. No significant interval change from the prior study. Electronically Signed   By: Inez Catalina M.D.   On: 03/18/2022 22:20   CT ANGIO HEAD NECK W WO CM (CODE STROKE)  Result Date: 03/18/2022 CLINICAL DATA:  Acute neuro deficit.  Stroke EXAM: CT ANGIOGRAPHY HEAD AND NECK TECHNIQUE: Multidetector CT imaging of the head and neck was performed using the standard protocol during bolus administration of intravenous contrast. Multiplanar CT image reconstructions and MIPs were obtained to evaluate the vascular anatomy. Carotid stenosis measurements (when applicable) are obtained utilizing NASCET criteria, using the distal internal carotid diameter as the denominator. RADIATION DOSE REDUCTION: This exam was performed according to the departmental dose-optimization program which includes automated exposure control, adjustment of the mA and/or kV according to patient size and/or use of iterative reconstruction technique. CONTRAST:  5m OMNIPAQUE IOHEXOL 350 MG/ML SOLN COMPARISON:  CT head 03/18/2022 FINDINGS: CTA NECK FINDINGS Aortic arch: Standard branching. Imaged portion shows no evidence of aneurysm or dissection. No significant stenosis of the major arch vessel origins. Right carotid system: Mild atherosclerotic disease right carotid bifurcation. No significant right carotid stenosis. Beaded appearance of the right internal carotid artery compatible with fibromuscular dysplasia. No stenosis or dissection Left carotid system: Mild atherosclerotic disease left carotid bifurcation without stenosis. Beaded appearance left internal carotid artery compatible with fibromuscular dysplasia. No dissection or stenosis. Vertebral arteries: Both vertebral arteries are patent to the skull base without stenosis. Skeleton: Cervical spondylosis. No acute  skeletal abnormality. Dental caries. Other neck: Negative for mass or edema. Gas in the veins in the left neck most likely introduced at vena puncture. Upper chest: Lung apices clear bilaterally Review of the MIP images confirms the above findings CTA HEAD FINDINGS Anterior circulation: Atherosclerotic calcification in the cavernous carotid bilaterally causing mild stenosis bilaterally. Anterior middle cerebral arteries opacified normally without stenosis or large vessel occlusion. 3 mm aneurysm of the supraclinoid internal carotid artery projecting posterolaterally. Probable posterior communicating artery aneurysm. Posterior circulation: Both vertebral arteries patent to the basilar. PICA patent bilaterally. Basilar widely patent. Superior cerebellar and posterior cerebral arteries patent bilaterally. No stenosis or aneurysm Venous sinuses: Limited venous enhancement due to arterial phase scanning Anatomic variants: None Review of the MIP images confirms the  above findings IMPRESSION: 1. No intracranial large vessel occlusion or significant stenosis 2. Mild atherosclerotic disease carotid bifurcation bilaterally without stenosis. 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 3. Both vertebral arteries widely patent 4. 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 Electronically Signed   By: Franchot Gallo M.D.   On: 03/18/2022 16:27   CT HEAD CODE STROKE WO CONTRAST  Result Date: 03/18/2022 CLINICAL DATA:  Code stroke.  Acute neuro deficit. EXAM: CT HEAD WITHOUT CONTRAST TECHNIQUE: Contiguous axial images were obtained from the base of the skull through the vertex without intravenous contrast. RADIATION DOSE REDUCTION: This exam was performed according to the departmental dose-optimization program which includes automated exposure control, adjustment of the mA and/or kV according to patient size and/or use of iterative  reconstruction technique. COMPARISON:  CT head 07/12/2020 FINDINGS: Brain: Ventricle size normal. Progression of white matter hypodensity diffusely which appears chronic. Negative for acute infarct, hemorrhage, mass. Vascular: Negative for hyperdense vessel Skull: No focal abnormality. Sinuses/Orbits: Paranasal sinuses clear. Bilateral cataract extraction Other: None ASPECTS ([place] Stroke Program Early CT Score) - Ganglionic level infarction (caudate, lentiform nuclei, internal capsule, insula, M1-M3 cortex): 7 - Supraganglionic infarction (M4-M6 cortex): 3 Total score (0-10 with 10 being normal): 10 IMPRESSION: 1. No acute abnormality 2. ASPECTS is 10 3. Progression of chronic microvascular ischemia since 2021 4. Code stroke imaging results were communicated on 03/18/2022 at 4:06 pm to provider Lorrin Goodell via text page Electronically Signed   By: Franchot Gallo M.D.   On: 03/18/2022 16:08    Assessment/Plan: FREIDA NEBEL is an 81 y.o. female with HFpEF (h/o HOCM s/p myomectomy), bioprosthetic TV, atrial fibrillation, HL, CKD 3 currently admitted with weakness and nephrology si consulted for evaluation and management of AKI on CKD.   **AKI on CKD 3b:  baseline creatinine in the 1.6-1.'9mg'$ /dL range now with AKI Cr 2.8 setting of presyncopal event and hypovolemia.  Creatinine improved to 2.'1mg'$ /dL and stable today. Renal US unrevealing.  Suspect she has mild tubular injury from a transient hypotensive event.  She currently appears euvolemic.  For now will hold torsemide, follow daily labs, I/Os.  Looks euvolemic - no IVF for now.  Underlying issue appears to be depression, see below.   I suspect she'll continue to improve but it make take weeks to establish her new baseline - no need to remain hospitalized for this issue as long as po intake is ok.   **Depression:  h/o such now with worsening symptoms on anniversary of husand's death - defer med adjustment to primary, but would benefit from change as the  anorexia led to presentation.   **Weakness/transient aphasia/AMS: underlying mild cognitive impairment, has been eval for CVA - not thought to be.  Likely just an effect of hypovolemia.  **HFpEF: has h/o HOCM s/p myomectomy.  For now would hold her diuretic in light of hypovolemic presentation.  Will need recs on discharge re: parameters for resuming diuretic.    **Anemia: mild Hb in 10s. No indications for ESA currently.   Will check in on patient tomorrow and determine f/u needs in [place] clinic.    Justin Mend 03/20/2022, 3:30 PM

## 2022-03-20 NOTE — Progress Notes (Signed)
[place] for warfarin Indication: atrial fibrillation  Allergies  Allergen Reactions   Petrolatum-Zinc Oxide Anaphylaxis, Rash and Swelling   Sulfa Antibiotics Rash   Sulfonamide Derivatives Anaphylaxis and Swelling   Tetanus Toxoid     Other reaction(s): Other (See Comments) OTHER REACTION   Tetanus Toxoids Swelling   Other Rash and Other (See Comments)    STERI - STRIPS  It can affect proteins in her kidneys so she doesn't take REACTION: proteinuria Other reaction(s): Other (See Comments), Unknown It can affect proteins in her kidneys so she doesn't take   Nsaids Other (See Comments)    REACTION: proteinuria Other reaction(s): Other (See Comments), Unknown It can affect proteins in her kidneys so she doesn't take    Tetanus Toxoid, Adsorbed Other (See Comments)    Other reaction(s): Other (See Comments) Turned arm red Turned arm red    Patient Measurements: Height: 5' 2.99" (160 cm) Weight: 71.4 kg (157 lb 6.5 oz) IBW/kg (Calculated) : 52.38  Vital Signs: Temp: 97.7 F (36.5 C) (08/17 0801) Temp Source: Oral (08/17 0801) BP: 139/95 (08/17 0801) Pulse Rate: 71 (08/17 0801)  Labs: Recent Labs    03/18/22 1555 03/18/22 1601 03/18/22 1819 03/19/22 0042 03/20/22 0434  HGB 11.5* 12.2  --  10.1* 10.0*  HCT 36.2 36.0  --  31.1* 30.6*  PLT 131*  --   --  102* 84*  APTT 38*  --   --   --   --   LABPROT 26.9*  --   --  28.8* 28.1*  INR 2.5*  --   --  2.8* 2.7*  CREATININE 2.48* 2.70*  --  2.13* 2.12*  TROPONINIHS 15  --  13  --   --      Estimated Creatinine Clearance: 20.7 mL/min (A) (by C-G formula based on SCr of 2.12 mg/dL (H)).   Medical History: Past Medical History:  Diagnosis Date   Allergic rhinitis    Asthma    NOS w/ acute exacerbation   Blood transfusion without reported diagnosis    CKD (chronic kidney disease) stage 3, GFR 30-59 ml/min (HCC) 09/12/2018   Colon polyps    TUBULAR ADENOMAS AND HYPERPLASTIC    Complete heart block (HCC)    requiring PPM (MDT) post surgical myomectomy at [facility],  leads are epicardial with abdominal implant, high ventricular threshold at implant   COPD (chronic obstructive pulmonary disease) (HCC)    Depressive disorder    Diastolic dysfunction    DM (diabetes mellitus) ([place])    Gallstones    GERD (gastroesophageal reflux disease)    Gout 08/20/2012   Heart murmur    Hyperpotassemia    Hypersomnia    Hypertension    Hypertrophic cardiomyopathy ([place])    s/p surgical myomectomy at [facility] 22/29 complicated by septal VSD post procedure requiring reoperation with patch closure and tricuspid valve replacement   Kidney stones    Myocardial infarction ([place]) 2011   Obstructive sleep apnea    persistent daytime sleepiness despite cpap   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   Psoriasis    Pyuria    Renal insufficiency    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   INR today came back therapeutic at 2.7 - on PTA dosing of warfarin. Hgb stable at 10, plt drifting down to 84 (will  monitor). No documented s/sx of bleeding.   PTA dosing: 2.'5mg'$  daily, except 1.'5mg'$  Wed  Goal of Therapy:  INR 2-3 Monitor platelets by anticoagulation protocol: Yes   Plan:  Warfarin 2.5 mg PO as per PTA dosing Daily INR, s/s bleeding  Holly Simmons, PharmD, [place] Pharmacist  Phone: [phone] 03/20/2022 9:33 AM  Please check AMION for all [place] phone numbers After 10:00 PM, call [place] [phone]

## 2022-03-20 NOTE — Progress Notes (Signed)
Initial Nutrition Assessment  DOCUMENTATION CODES:   Not applicable  INTERVENTION:  Liberalize diet from a heart healthy to a regular diet to provide widest variety of menu options to enhance nutritional adequacy Ensure Enlive po BID, each supplement provides 350 kcal and 20 grams of protein. MVI with minerals daily  NUTRITION DIAGNOSIS:   Increased nutrient needs related to acute illness as evidenced by estimated needs.  GOAL:   Patient will meet greater than or equal to 90% of their needs  MONITOR:   PO intake, Supplement acceptance, Diet advancement, Labs  REASON FOR ASSESSMENT:   Consult Assessment of nutrition requirement/status  ASSESSMENT:   Pt admitted with generalized weakness, near syncopal episode and aphasia found to have AKI. PMH significant for HLD, CKD stage IIIb, depression, chronic diastolic HF, bioprosthetic tricuspid valve, afib on coumadin.   Per MD note, Neuro was consulted and patient was admitted with concern for prerenal azotemia causing acute encephalopathy. Low suspicion for stroke.   RD working remotely. Unsuccessful attempt to reach pt via phone call to room.  Per review of H&P, pt family reported that she has had a significant decrease in oral intake and not been a great eater but mainly taking in fluids within the last couple of weeks. Family relates decreased PO intake to increased depression associated with 1 year anniversary of her husbands passing.   Meal completions: 8/16: 75%-dinner 8/17: 85%-lunch  Reviewed weight history. Patient's weight appears to have gradually been elevating within the last year. Current admit weight noted to be 71.4 kg.  Medications: marinol, pepcid, ferrous sulfate, warfarin  Labs: BUN 25, Cr 2.12, alkaline phos 131, GFR 23  UOP: 430m x12 hours + [id number] hours  I/O's: +[id number] admission  NUTRITION - FOCUSED PHYSICAL EXAM: RD working remotely. Deferred to follow up.  Diet Order:   Diet Order              Diet regular Room service appropriate? Yes; Fluid consistency: Thin  Diet effective now                   EDUCATION NEEDS:   No education needs have been identified at this time  Skin:  Skin Assessment: Reviewed RN Assessment  Last BM:  8/16 (type 4)  Height:   Ht Readings from Last 1 Encounters:  03/19/22 5' 2.99" (1.6 m)    Weight:   Wt Readings from Last 1 Encounters:  03/20/22 71.4 kg   BMI:  Body mass index is 27.89 kg/m.  Estimated Nutritional Needs:   Kcal:  1600-1800  Protein:  85-95g  Fluid:  >/=1.6L  [place] DanaDN, LDN Clinical Nutrition

## 2022-03-20 NOTE — Evaluation (Signed)
Occupational Therapy Evaluation Patient Details Name: Holly Simmons MRN: [id number] DOB: 1941/06/16 Today's Date: 03/20/2022   History of Present Illness Pt is 81 yo female who presented on 03/18/22 with aphasia, weakness, and near syncope in setting of decreased oral intake.  CT head negative, neuro consulted and felt low suspicion for stroke and more likely encephalopathy.  Pt with AKI on stage IV CKD.  Pt with hx of CKD, HF, cardiomyopathy, pacemaker, afib, HLD, and cognitive impairment without formal diagnosis   Clinical Impression   Pt's daughter reported they will continue to provide 24/7 support with the return to home. Pt at PLOF had 24/7 support and occasional support for LB dressing. Pt at this time needs min guard to supervision to Ronda at this time which is baseline as seen bellow. Pt's family did not have other concerns at this time. Occupational Therapy signing off. Thank you.    Recommendations for follow up therapy are one component of a multi-disciplinary discharge planning process, led by the attending physician.  Recommendations may be updated based on patient status, additional functional criteria and insurance authorization.   Follow Up Recommendations  No OT follow up    Assistance Recommended at Discharge Frequent or constant Supervision/Assistance  Patient can return home with the following A little help with walking and/or transfers;A little help with bathing/dressing/bathroom;Assistance with cooking/housework;Assistance with feeding;Direct supervision/assist for medications management;Direct supervision/assist for financial management;Assist for transportation    Functional Status Assessment  Patient has had a recent decline in their functional status and demonstrates the ability to make significant improvements in function in a reasonable and predictable amount of time.  Equipment Recommendations  None recommended by OT    Recommendations for Other  Services       Precautions / Restrictions Precautions Precautions: Fall Restrictions Weight Bearing Restrictions: No      Mobility Bed Mobility Overal bed mobility: Modified Independent Bed Mobility: Supine to Sit     Supine to sit: HOB elevated Sit to supine: HOB elevated   General bed mobility comments: Pt's bed able to adjust as needed    Transfers Overall transfer level: Needs assistance Equipment used: Rolling walker (2 wheels), Rollator (4 wheels), None Transfers: Sit to/from Stand Sit to Stand: Min guard, Supervision                  Balance Overall balance assessment: Needs assistance Sitting-balance support: No upper extremity supported Sitting balance-Leahy Scale: Normal     Standing balance support: Bilateral upper extremity supported, No upper extremity supported Standing balance-Leahy Scale: Fair Standing balance comment: when ambulating needs BUE support                           ADL either performed or assessed with clinical judgement   ADL Overall ADL's : Needs assistance/impaired Eating/Feeding: Set up;Sitting   Grooming: Wash/dry hands;Wash/dry face;Oral care;Min guard;Standing   Upper Body Bathing: Set up;Sitting   Lower Body Bathing: Min guard;Sit to/from stand   Upper Body Dressing : Set up;Sitting   Lower Body Dressing: Cueing for safety;Cueing for sequencing;Sit to/from stand;Min guard   Toilet Transfer: Min guard;Cueing for safety;Cueing for sequencing;Rolling walker (2 wheels)   Toileting- Water quality scientist and Hygiene: Min guard;Cueing for safety;Cueing for sequencing   Tub/ Shower Transfer: Min guard;Cueing for safety;Cueing for sequencing;Rolling walker (2 wheels)   Functional mobility during ADLs: Min guard;Cueing for safety;Cueing for sequencing;Rolling walker (2 wheels)       Vision  Perception     Praxis      Pertinent Vitals/Pain Pain Assessment Pain Assessment: No/denies pain      Hand Dominance     Extremity/Trunk Assessment Upper Extremity Assessment Upper Extremity Assessment: Overall WFL for tasks assessed   Lower Extremity Assessment Lower Extremity Assessment: Defer to PT evaluation   Cervical / Trunk Assessment Cervical / Trunk Assessment: Normal   Communication Communication Communication: No difficulties   Cognition Arousal/Alertness: Awake/alert Behavior During Therapy: WFL for tasks assessed/performed Overall Cognitive Status: Impaired/Different from baseline Area of Impairment: Orientation, Problem solving, Memory, Awareness                 Orientation Level: Time   Memory: Decreased recall of precautions, Decreased short-term memory     Awareness: Emergent Problem Solving: Slow processing General Comments: History of cognitive impairments.  Pt following basic commands with increased time.  Increased time to respond.  Pt knew day of week and month, but could not state year (family reports that is normal).  Family reports improvement since admission but not back at baseline cognition     General Comments       Exercises     Shoulder Instructions      Home Living Family/patient expects to be discharged to:: Private residence Living Arrangements: Children Available Help at Discharge: Family;Personal care attendant;Available 24 hours/day Type of Home: House Home Access: Stairs to enter [employer] of Steps: 1 Entrance Stairs-Rails: None Home Layout: Multi-level;Able to live on main level with bedroom/bathroom     Bathroom Shower/Tub: Occupational psychologist: Handicapped height Bathroom Accessibility: Yes   Home Equipment: Air cabin crew (4 wheels)   Additional Comments: Has 2 shower seats (one in shower and one to rest outside shower)      Prior Functioning/Environment Prior Level of Function : Independent/Modified Independent             Mobility Comments: Has a rollator uses all  the time in community and occasionally inside house; furniture/wall surfs in house frequently; last fall was 07/2020 and started using rollator ADLs Comments: Has assist to get in/out shower, pt performs shower once in; pt dresses self but occasionally needs help with socks or bra hook        OT Problem List: Decreased strength;Decreased range of motion;Decreased activity tolerance;Impaired balance (sitting and/or standing);Decreased knowledge of use of DME or AE;Decreased safety awareness;Decreased cognition      OT Treatment/Interventions: Self-care/ADL training;Therapeutic exercise;DME and/or AE instruction;Therapeutic activities;Patient/family education;Balance training    OT Goals(Current goals can be found in the care plan section) Acute Rehab OT Goals Patient Stated Goal: to go home to home OT Goal Formulation: With patient Time For Goal Achievement: 04/03/22 Potential to Achieve Goals: Good  OT Frequency: Min 2X/week    Co-evaluation              AM-PAC OT "6 Clicks" Daily Activity     Outcome Measure Help from another person eating meals?: A Little Help from another person taking care of personal grooming?: A Little Help from another person toileting, which includes using toliet, bedpan, or urinal?: A Little Help from another person bathing (including washing, rinsing, drying)?: A Little Help from another person to put on and taking off regular upper body clothing?: A Little Help from another person to put on and taking off regular lower body clothing?: A Little 6 Click Score: 18   End of Session Equipment Utilized During Treatment: Gait belt;Rolling walker (2 wheels) Nurse Communication:  Mobility status  Activity Tolerance: Patient tolerated treatment well Patient left: with call bell/phone within reach;in chair;with chair alarm set  OT Visit Diagnosis: Unsteadiness on feet (R26.81);Other abnormalities of gait and mobility (R26.89);Repeated falls (R29.6);Muscle  weakness (generalized) (M62.81)                Time: 3212-2482 OT Time Calculation (min): 27 min Charges:  OT General Charges $OT Visit: 1 Visit OT Evaluation $OT Eval Low Complexity: 1 Low OT Treatments $Self Care/Home Management : 8-22 mins  Joeseph Amor OTR/L  Acute Rehab Services  [phone] office number [phone] pager number   Joeseph Amor 03/20/2022, 11:55 AM

## 2022-03-20 NOTE — Progress Notes (Signed)
OT Cancellation Note  Patient Details Name: Holly Simmons MRN: [id number] DOB: 1941-01-15   Cancelled Treatment:    Reason Eval/Treat Not Completed: Patient at procedure or test/ unavailable  Joeseph Amor OTR/L  Acute Rehab Services  [phone] office number [phone] pager number  Joeseph Amor 03/20/2022, 8:05 AM

## 2022-03-20 NOTE — Progress Notes (Signed)
Progress Note  Patient: STEPHENI CAMERON ZHG:[id number] DOB: 1941/05/19  DOA: 03/18/2022  DOS: 03/20/2022    Brief hospital course: LOAN OGUIN is a 81 y.o. female with a history of stage IV CKD, chronic HFpEF, hypertrophic cardiomyopathy s/p myomectomy 2012, CHB s/p PPM, bioprosthetic TV, atrial fibrillation on coumadin, HLD, cognitive impairment without formal diagnosis who presented to the ED due to aphasia, generalized weakness, near syncope in the setting of decreased (subacute/chronically) oral intake. Found to have AKI. Troponin normal x2, ECG paced. CT head showed no acute abnormalities, CTA head and neck showed chronic ischemic changes, mild nonobstructive atherosclerosis. Neurology was consulted, IV fluids started and patient admitted with concern for prerenal azotemia causing acute encephalopathy. Neurology has low suspicion for stroke.   Assessment and Plan: AKI on stage IV CKD: Improving with IV fluids, dehydrated at intake by history and exam.  - Appears euvolemic and do not want to push into overload so will stop IVF, continue to encourage adequate po intake. UOP is normal. - Cr plateau around 2.16-2.17 is not her putative baseline. Renal U/S confirms no acute changes and stable changes of chronic medical renal disease. Discussed that this may be either a new baseline or will be slow to continue improving. Will need repeat labs at [place]. Will discuss with nephrology whose help is appreciated.  Weakness: Nonfocal, improving. This has occurred in the past with dehydration, usually improved enough initially with IVF to be sent home. This is the worst time.  - PT/OT/SLP consulted, pt is improving near her baseline.  Acute metabolic encephalopathy on chronic cognitive impairment, depression: Patient has had abnormal cognitive function for many years without a formal diagnosis. Mentation is improving.  - Consider formal evaluation and consideration of aricept Tx if confirmed -  Continue vilazodone and bupropion for mood disorder.  - CT showed no acute abnormalities. Unable to pursue MRI, so repeat CT performed and continues to show no stroke.   Chronic HFpEF, hypertrophic cardiomyopathy s/p myomectomy 2012, CHB s/p PPM, bioprosthetic TV, atrial fibrillation on coumadin: Echocardiogram similar to prior with preserved LV systolic function, tricuspid stenosis noted with severe RAE which was present previously. IVC normal, normal RV function, elevated PASP.  - Continue metoprolol and coumadin dosing per pharmacy, has been on stable dose.  - Cardiac monitoring has remained stable, will DC telemetry to facilitate mobility.  - Recommend follow up with cardiology for tricuspid valve stenosis.   COPD: Chronic, stable. No wheezing - continue controller med, prn albuterol.   HLD:  - Continue statin  Thrombocytopenia: Chronic, no bleeding, a bit worse again today (all cell lines down with IVF however) - Check again with IPF in AM, monitor for bleeding.   Leukopenia: Unclear significance, had this in Feb 2023 as well. Could consider work up if persistent. AlkPhos elevated very mildly, will suggest recheck, other LFTs wnl.  Anemia: Initially macrocytic, now normocytic. Presumably due to CKD. Stable without bleeding. Also on iron which we'll continue. 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 - Outpatient follow up recommended  Subjective: No dyspnea, off oxygen. She's feeling more alert and confirmed to be more alert by family. They are concerned about her stability for discharge. No bleeding or bruising, she's had low platelets before. Normal UOP. No chest pain or palpitations.  Objective: Vitals:   03/20/22 0031 03/20/22 0354 03/20/22 0801 03/20/22 1100  BP: 131/65 124/61 (!) 139/95 134/62  Pulse: 65  62 71 71  Resp: '20 20 19 17  '$ Temp: 98.4 F (36.9 C) 98.5 F (36.9 C) 97.7 F (36.5  C) 97.7 F (36.5 C)  TempSrc: Oral Oral Oral Axillary  SpO2: 95% 93% 93%   Weight: 71.4 kg     Height:       Gen: 81 y.o. female in no distress Pulm: Nonlabored breathing room air. very faint crackles bilaterally at bases. CV: Regular paced. Soft early systolic murmur at the apex, no rub, or gallop. No JVD when upright, trace dependent edema. GI: Abdomen soft, non-tender, non-distended, with normoactive bowel sounds.  Ext: Warm, no deformities Skin: No rashes, lesions or ulcers on visualized skin. Neuro: Alert and interactive, conversant without dysarthria or focal neurological deficits. Psych: Judgement and insight appear marginal. Mood euthymic & affect congruent. Behavior is appropriate.    Data Personally reviewed: CBC: Recent Labs  Lab 03/18/22 1555 03/18/22 1601 03/19/22 0042 03/20/22 0434  WBC 4.6  --  3.4* 3.3*  NEUTROABS 3.3  --   --   --   HGB 11.5* 12.2 10.1* 10.0*  HCT 36.2 36.0 31.1* 30.6*  MCV 100.6*  --  101.0* 99.0  PLT 131*  --  102* 84*   Basic Metabolic Panel: Recent Labs  Lab 03/18/22 1555 03/18/22 1601 03/18/22 2024 03/19/22 0042 03/20/22 0434  NA 137 139  --  138 138  K 4.3 4.4  --  4.0 4.0  CL 99 97*  --  105 106  CO2 30  --   --  26 24  GLUCOSE 120* 112*  --  84 73  BUN 28* 31*  --  27* 25*  CREATININE 2.48* 2.70*  --  2.13* 2.12*  CALCIUM 10.1  --   --  9.3 9.6  MG  --   --  2.0  --   --    GFR: Estimated Creatinine Clearance: 20.7 mL/min (A) (by C-G formula based on SCr of 2.12 mg/dL (H)). Liver Function Tests: Recent Labs  Lab 03/18/22 1555  AST 16  ALT 12  ALKPHOS 131*  BILITOT 0.9  PROT 6.8  ALBUMIN 3.8   Coagulation Profile: Recent Labs  Lab 03/18/22 1555 03/19/22 0042 03/20/22 0434  INR 2.5* 2.8* 2.7*   HbA1C: Recent Labs    03/18/22 2024  HGBA1C 5.3   CBG: Recent Labs  Lab 03/18/22 1555  GLUCAP 120*   Lipid Profile: Recent Labs    03/19/22 0042  CHOL 129  HDL 47  LDLCALC 74  TRIG 40  CHOLHDL 2.7    Urine analysis:    Component Value Date/Time   COLORURINE STRAW (A) 03/18/2022 1800   APPEARANCEUR CLEAR 03/18/2022 1800   LABSPEC 1.012 03/18/2022 1800   PHURINE 7.0 03/18/2022 1800   GLUCOSEU NEGATIVE 03/18/2022 1800   GLUCOSEU NEGATIVE 07/12/2020 1631   HGBUR NEGATIVE 03/18/2022 1800   BILIRUBINUR NEGATIVE 03/18/2022 1800   BILIRUBINUR neg 05/14/2012 1053   KETONESUR NEGATIVE 03/18/2022 1800   PROTEINUR NEGATIVE 03/18/2022 1800   UROBILINOGEN 0.2 07/12/2020 1631   NITRITE NEGATIVE 03/18/2022 1800   LEUKOCYTESUR SMALL (A) 03/18/2022 1800   Recent Results (from the past 240 hour(s))  Resp Panel by RT-PCR (Flu A&B, Covid) Anterior Nasal Swab     Status: None   Collection Time: 03/18/22  3:55 PM   Specimen: Anterior Nasal Swab  Result Value Ref Range Status   SARS Coronavirus 2 by RT PCR NEGATIVE NEGATIVE Final    Comment: (NOTE) SARS-CoV-2 target nucleic acids are NOT  DETECTED.  The SARS-CoV-2 RNA is generally detectable in upper respiratory specimens during the acute phase of infection. The lowest concentration of SARS-CoV-2 viral copies this assay can detect is 138 copies/mL. A negative result does not preclude SARS-Cov-2 infection and should not be used as the sole basis for treatment or other patient management decisions. A negative result may occur with  improper specimen collection/handling, submission of specimen other than nasopharyngeal swab, presence of viral mutation(s) within the areas targeted by this assay, and inadequate number of viral copies(<138 copies/mL). A negative result must be combined with clinical observations, patient history, and epidemiological information. The expected result is Negative.  Fact Sheet for Patients:  EntrepreneurPulse.com.au  Fact Sheet for Healthcare Providers:  IncredibleEmployment.be  This test is no t yet approved or cleared by the [place] FDA and  has been authorized for  detection and/or diagnosis of SARS-CoV-2 by FDA under an Emergency Use Authorization (EUA). This EUA will remain  in effect (meaning this test can be used) for the duration of the COVID-19 declaration under Section 564(b)(1) of the Act, 21 U.S.C.section 360bbb-3(b)(1), unless the authorization is terminated  or revoked sooner.       Influenza A by PCR NEGATIVE NEGATIVE Final   Influenza B by PCR NEGATIVE NEGATIVE Final    Comment: (NOTE) The Xpert Xpress SARS-CoV-2/FLU/RSV plus assay is intended as an aid in the diagnosis of influenza from Nasopharyngeal swab specimens and should not be used as a sole basis for treatment. Nasal washings and aspirates are unacceptable for Xpert Xpress SARS-CoV-2/FLU/RSV testing.  Fact Sheet for Patients: EntrepreneurPulse.com.au  Fact Sheet for Healthcare Providers: IncredibleEmployment.be  This test is not yet approved or cleared by the [place] FDA and has been authorized for detection and/or diagnosis of SARS-CoV-2 by FDA under an Emergency Use Authorization (EUA). This EUA will remain in effect (meaning this test can be used) for the duration of the COVID-19 declaration under Section 564(b)(1) of the Act, 21 U.S.C. section 360bbb-3(b)(1), unless the authorization is terminated or revoked.  Performed at [facility], [place] [address] [place], [place] [address]      US RENAL  Result Date: 03/20/2022 CLINICAL DATA:  Acute kidney injury. History diabetes mellitus, hypertension, stage III chronic kidney disease EXAM: RENAL / URINARY TRACT ULTRASOUND COMPLETE COMPARISON:  11/01/2019 FINDINGS: Right Kidney: Renal measurements: 11.2 x 5.3 x 4.8 cm = volume: 148 mL. Cortical thinning. Increased cortical echogenicity. No mass, hydronephrosis, or shadowing calcification. Left Kidney: Renal measurements: 9.7 x 5.9 x 5.1 cm = volume: 154 mL. Cortical thinning. Increased cortical echogenicity. No mass,  hydronephrosis, or shadowing calcification. Bladder: Appears normal for degree of bladder distention. Other: N/A IMPRESSION: Cortical atrophy and medical renal disease changes of both kidneys. No evidence of renal mass or hydronephrosis. Electronically Signed   By: Lavonia Dana M.D.   On: 03/20/2022 08:44   CT HEAD WO CONTRAST (5MM)  Result Date: 03/19/2022 CLINICAL DATA:  Neuro deficit, stroke suspected, acute metabolic encephalopathy on chronic cognitive impairment EXAM: CT HEAD WITHOUT CONTRAST TECHNIQUE: Contiguous axial images were obtained from the base of the skull through the vertex without intravenous contrast. RADIATION DOSE REDUCTION: This exam was performed according to the departmental dose-optimization program which includes automated exposure control, adjustment of the mA and/or kV according to patient size and/or use of iterative reconstruction technique. COMPARISON:  03/18/2022 FINDINGS: Brain: No evidence of acute infarction, hemorrhage, hydrocephalus, extra-axial collection or mass lesion/mass effect. Periventricular and deep white hypodensity. Vascular: No hyperdense  vessel or unexpected calcification. Skull: Normal. Negative for fracture or focal lesion. Sinuses/Orbits: No acute finding. Other: None. IMPRESSION: No acute intracranial pathology. Small-vessel white matter disease. Electronically Signed   By: Delanna Ahmadi M.D.   On: 03/19/2022 18:08   ECHOCARDIOGRAM COMPLETE  Result Date: 03/19/2022    ECHOCARDIOGRAM REPORT   Patient Name:   KINNEDY MONGIELLO Date of Exam: 03/19/2022 Medical Rec #:  [id number]            Height:       63.0 in Accession #:    [id number]           Weight:       156.5 lb Date of Birth:  Dec 18, 1940           BSA:          1.742 m Patient Age:    34 years             BP:           143/63 mmHg Patient Gender: F                    HR:           68 bpm. Exam Location:  Inpatient Procedure: 2D Echo, Cardiac Doppler and Color Doppler Indications:    Murmur   History:        Patient has prior history of Echocardiogram examinations, most                 recent 06/14/2021. COPD; Risk Factors:Hypertension, Diabetes and                 Sleep Apnea.  Sonographer:    Jefferey Pica Referring Phys: [place]  1. Left ventricular ejection fraction, by estimation, is 50 to 55%. The left ventricle has low normal function. The left ventricle has no regional wall motion abnormalities. There is mild asymmetric left ventricular hypertrophy of the posterior wall segment. Left ventricular diastolic parameters are indeterminate.  2. Right ventricular systolic function is normal. The right ventricular size is normal. There is moderately elevated pulmonary artery systolic pressure.  3. Left atrial size was mildly dilated.  4. Right atrial size was severely dilated.  5. The mitral valve is degenerative. No evidence of mitral valve regurgitation. Mild mitral stenosis. Moderate mitral annular calcification.  6. Bioprosthetic tricuspid valve. TV mean gradient 7.7 mmHg suggesting stenosis of the the bioprosthetic valve.  7. The aortic valve is calcified. Aortic valve regurgitation is mild. Mild aortic valve stenosis. Aortic regurgitation PHT measures 377 msec. Aortic valve mean gradient measures 9.0 mmHg. Aortic valve Vmax measures 2.08 m/s.  8. The inferior vena cava is normal in size with greater than 50% respiratory variability, suggesting right atrial pressure of 3 mmHg. FINDINGS  Left Ventricle: Left ventricular ejection fraction, by estimation, is 50 to 55%. The left ventricle has low normal function. The left ventricle has no regional wall motion abnormalities. The left ventricular internal cavity size was normal in size. There is mild asymmetric left ventricular hypertrophy of the posterior wall segment. Left ventricular diastolic parameters are indeterminate. Right Ventricle: The right ventricular size is normal. No increase in right ventricular wall  thickness. Right ventricular systolic function is normal. There is moderately elevated pulmonary artery systolic pressure. The tricuspid regurgitant velocity is 2.76 m/s, and with an assumed right atrial pressure of 15 mmHg, the estimated right ventricular systolic pressure is 01.0 mmHg. Left Atrium: Left atrial size was  mildly dilated. Right Atrium: Right atrial size was severely dilated. Pericardium: There is no evidence of pericardial effusion. Mitral Valve: The mitral valve is degenerative in appearance. Moderate mitral annular calcification. No evidence of mitral valve regurgitation. Mild mitral valve stenosis. Tricuspid Valve: Bioprosthetic tricuspid valve. TV mean gradient 7.7 mmHg suggesting stenosis of the the bioprosthetic valve. Tricuspid valve regurgitation is not demonstrated. Aortic Valve: The aortic valve is calcified. Aortic valve regurgitation is mild. Aortic regurgitation PHT measures 377 msec. Mild aortic stenosis is present. Aortic valve mean gradient measures 9.0 mmHg. Aortic valve peak gradient measures 17.4 mmHg. Aortic valve area, by VTI measures 2.87 cm. Pulmonic Valve: The pulmonic valve was not well visualized. Pulmonic valve regurgitation is not visualized. No evidence of pulmonic stenosis. Aorta: The aortic root is normal in size and structure. Venous: The inferior vena cava is normal in size with greater than 50% respiratory variability, suggesting right atrial pressure of 3 mmHg. IAS/Shunts: No atrial level shunt detected by color flow Doppler.  LEFT VENTRICLE PLAX 2D LVIDd:         5.00 cm   Diastology LVIDs:         3.80 cm   LV e' medial:    3.09 cm/s LV PW:         1.10 cm   LV E/e' medial:  39.5 LV IVS:        0.80 cm   LV e' lateral:   5.06 cm/s LVOT diam:     2.00 cm   LV E/e' lateral: 24.1 LV SV:         135 LV SV Index:   78 LVOT Area:     3.14 cm  IVC IVC diam: 2.50 cm LEFT ATRIUM             Index        RIGHT ATRIUM           Index LA diam:        4.30 cm 2.47 cm/m   RA  Area:     23.40 cm LA Vol (A2C):   59.4 ml 34.09 ml/m  RA Volume:   80.30 ml  46.09 ml/m LA Vol (A4C):   66.8 ml 38.34 ml/m LA Biplane Vol: 64.5 ml 37.02 ml/m  AORTIC VALVE                     PULMONIC VALVE AV Area (Vmax):    3.09 cm      PV Vmax:       0.88 m/s AV Area (Vmean):   2.89 cm      PV Peak grad:  3.1 mmHg AV Area (VTI):     2.87 cm AV Vmax:           208.50 cm/s AV Vmean:          137.000 cm/s AV VTI:            0.471 m AV Peak Grad:      17.4 mmHg AV Mean Grad:      9.0 mmHg LVOT Vmax:         205.00 cm/s LVOT Vmean:        126.000 cm/s LVOT VTI:          0.431 m LVOT/AV VTI ratio: 0.92 AI PHT:            377 msec  AORTA Ao Root diam: 3.30 cm Ao Asc diam:  2.70 cm MITRAL VALVE  TRICUSPID VALVE MV Area (PHT): 3.34 cm     TV Peak grad:   12.1 mmHg MV Decel Time: 227 msec     TV Mean grad:   7.7 mmHg MV E velocity: 122.00 cm/s  TV Vmax:        1.74 m/s MV A velocity: 94.70 cm/s   TV Vmean:       132.0 cm/s MV E/A ratio:  1.29         TV VTI:         0.83 msec                             TR Peak grad:   30.5 mmHg                             TR Vmax:        276.00 cm/s                              SHUNTS                             Systemic VTI:  0.43 m                             Systemic Diam: 2.00 cm Kardie Tobb DO Electronically signed by Berniece Salines DO Signature Date/Time: 03/19/2022/4:05:25 PM    Final    CT HEAD WO CONTRAST  Result Date: 03/18/2022 CLINICAL DATA:  Recent syncopal episode EXAM: CT HEAD WITHOUT CONTRAST TECHNIQUE: Contiguous axial images were obtained from the base of the skull through the vertex without intravenous contrast. RADIATION DOSE REDUCTION: This exam was performed according to the departmental dose-optimization program which includes automated exposure control, adjustment of the mA and/or kV according to patient size and/or use of iterative reconstruction technique. COMPARISON:  CT from earlier in the same day. FINDINGS: Brain: No evidence of acute  infarction, hemorrhage, hydrocephalus, extra-axial collection or mass lesion/mass effect. Chronic ischemic changes are again noted and stable. Vascular: No hyperdense vessel or unexpected calcification. Skull: Normal. Negative for fracture or focal lesion. Sinuses/Orbits: No acute finding. Other: None. IMPRESSION: Chronic ischemic changes without acute abnormality. No significant interval change from the prior study. Electronically Signed   By: Inez Catalina M.D.   On: 03/18/2022 22:20   CT ANGIO HEAD NECK W WO CM (CODE STROKE)  Result Date: 03/18/2022 CLINICAL DATA:  Acute neuro deficit.  Stroke EXAM: CT ANGIOGRAPHY HEAD AND NECK TECHNIQUE: Multidetector CT imaging of the head and neck was performed using the standard protocol during bolus administration of intravenous contrast. Multiplanar CT image reconstructions and MIPs were obtained to evaluate the vascular anatomy. Carotid stenosis measurements (when applicable) are obtained utilizing NASCET criteria, using the distal internal carotid diameter as the denominator. RADIATION DOSE REDUCTION: This exam was performed according to the departmental dose-optimization program which includes automated exposure control, adjustment of the mA and/or kV according to patient size and/or use of iterative reconstruction technique. CONTRAST:  81m OMNIPAQUE IOHEXOL 350 MG/ML SOLN COMPARISON:  CT head 03/18/2022 FINDINGS: CTA NECK FINDINGS Aortic arch: Standard branching. Imaged portion shows no evidence of aneurysm or dissection. No significant stenosis of the major arch vessel origins. Right carotid system: Mild atherosclerotic disease right carotid bifurcation.  No significant right carotid stenosis. Beaded appearance of the right internal carotid artery compatible with fibromuscular dysplasia. No stenosis or dissection Left carotid system: Mild atherosclerotic disease left carotid bifurcation without stenosis. Beaded appearance left internal carotid artery compatible with  fibromuscular dysplasia. No dissection or stenosis. Vertebral arteries: Both vertebral arteries are patent to the skull base without stenosis. Skeleton: Cervical spondylosis. No acute skeletal abnormality. Dental caries. Other neck: Negative for mass or edema. Gas in the veins in the left neck most likely introduced at vena puncture. Upper chest: Lung apices clear bilaterally Review of the MIP images confirms the above findings CTA HEAD FINDINGS Anterior circulation: Atherosclerotic calcification in the cavernous carotid bilaterally causing mild stenosis bilaterally. Anterior middle cerebral arteries opacified normally without stenosis or large vessel occlusion. 3 mm aneurysm of the supraclinoid internal carotid artery projecting posterolaterally. Probable posterior communicating artery aneurysm. Posterior circulation: Both vertebral arteries patent to the basilar. PICA patent bilaterally. Basilar widely patent. Superior cerebellar and posterior cerebral arteries patent bilaterally. No stenosis or aneurysm Venous sinuses: Limited venous enhancement due to arterial phase scanning Anatomic variants: None Review of the MIP images confirms the above findings IMPRESSION: 1. No intracranial large vessel occlusion or significant stenosis 2. Mild atherosclerotic disease carotid bifurcation bilaterally without stenosis. 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 3. Both vertebral arteries widely patent 4. 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 Electronically Signed   By: Franchot Gallo M.D.   On: 03/18/2022 16:27   CT HEAD CODE STROKE WO CONTRAST  Result Date: 03/18/2022 CLINICAL DATA:  Code stroke.  Acute neuro deficit. EXAM: CT HEAD WITHOUT CONTRAST TECHNIQUE: Contiguous axial images were obtained from the base of the skull through the vertex without intravenous contrast. RADIATION DOSE REDUCTION: This exam was  performed according to the departmental dose-optimization program which includes automated exposure control, adjustment of the mA and/or kV according to patient size and/or use of iterative reconstruction technique. COMPARISON:  CT head 07/12/2020 FINDINGS: Brain: Ventricle size normal. Progression of white matter hypodensity diffusely which appears chronic. Negative for acute infarct, hemorrhage, mass. Vascular: Negative for hyperdense vessel Skull: No focal abnormality. Sinuses/Orbits: Paranasal sinuses clear. Bilateral cataract extraction Other: None ASPECTS ([place] Stroke Program Early CT Score) - Ganglionic level infarction (caudate, lentiform nuclei, internal capsule, insula, M1-M3 cortex): 7 - Supraganglionic infarction (M4-M6 cortex): 3 Total score (0-10 with 10 being normal): 10 IMPRESSION: 1. No acute abnormality 2. ASPECTS is 10 3. Progression of chronic microvascular ischemia since 2021 4. Code stroke imaging results were communicated on 03/18/2022 at 4:06 pm to provider Lorrin Goodell via text page Electronically Signed   By: Franchot Gallo M.D.   On: 03/18/2022 16:08    Family Communication: Son by speaker phone, Daughter at bedside  Disposition: Status is: Inpatient Remains inpatient appropriate because: Persistent AKI, progressive thrombocytopenia. Nephrology consultation pending Planned Discharge Destination: Home, possible 8/18  Patrecia Pour, MD 03/20/2022 1:38 PM Page by [email or web address]

## 2022-03-20 NOTE — Progress Notes (Signed)
Mobility Specialist - Progress Note  03/20/22 1130  Mobility  HOB Elevated/Bed Position Self regulated  Activity Ambulated with assistance in hallway  Range of Motion/Exercises Active  Level of Assistance Standby assist, set-up cues, supervision of patient - no hands on  Assistive Device Four wheel walker  Distance Ambulated (ft) 250 ft  Activity Response Tolerated well  Transport method Ambulatory  $Mobility charge 1 Mobility   Pt received in bed and agreeable to mobility. Pt to bed after session with all needs met.    [facility]

## 2022-03-21 ENCOUNTER — Encounter: Payer: Self-pay | Admitting: Internal Medicine

## 2022-03-21 DIAGNOSIS — N179 Acute kidney failure, unspecified: Secondary | ICD-10-CM | POA: Diagnosis not present

## 2022-03-21 DIAGNOSIS — E785 Hyperlipidemia, unspecified: Secondary | ICD-10-CM | POA: Diagnosis not present

## 2022-03-21 DIAGNOSIS — I5032 Chronic diastolic (congestive) heart failure: Secondary | ICD-10-CM | POA: Diagnosis not present

## 2022-03-21 DIAGNOSIS — R55 Syncope and collapse: Secondary | ICD-10-CM | POA: Diagnosis not present

## 2022-03-21 LAB — IRON AND TIBC
Iron: 40 ug/dL (ref 28–170)
Saturation Ratios: 16 % (ref 10.4–31.8)
TIBC: 253 ug/dL (ref 250–450)
UIBC: 213 ug/dL

## 2022-03-21 LAB — BASIC METABOLIC PANEL
Anion gap: 6 (ref 5–15)
BUN: 26 mg/dL — ABNORMAL HIGH (ref 8–23)
CO2: 27 mmol/L (ref 22–32)
Calcium: 10 mg/dL (ref 8.9–10.3)
Chloride: 106 mmol/L (ref 98–111)
Creatinine, Ser: 1.96 mg/dL — ABNORMAL HIGH (ref 0.44–1.00)
GFR, Estimated: 26 mL/min — ABNORMAL LOW (ref >60)
Glucose, Bld: 85 mg/dL (ref 70–99)
Potassium: 4 mmol/L (ref 3.5–5.1)
Sodium: 139 mmol/L (ref 135–145)

## 2022-03-21 LAB — CBC
HCT: 30.8 % — ABNORMAL LOW (ref 36.0–46.0)
Hemoglobin: 9.9 g/dL — ABNORMAL LOW (ref 12.0–15.0)
MCH: 31.6 pg (ref 26.0–34.0)
MCHC: 32.1 g/dL (ref 30.0–36.0)
MCV: 98.4 fL (ref 80.0–100.0)
Platelets: 84 10*3/uL — ABNORMAL LOW (ref 150–400)
RBC: 3.13 MIL/uL — ABNORMAL LOW (ref 3.87–5.11)
RDW: 13.9 % (ref 11.5–15.5)
WBC: 3.5 10*3/uL — ABNORMAL LOW (ref 4.0–10.5)
nRBC: 0 % (ref 0.0–0.2)

## 2022-03-21 LAB — LACTATE DEHYDROGENASE: LDH: 128 U/L (ref 98–192)

## 2022-03-21 LAB — PROTIME-INR
INR: 2.5 — ABNORMAL HIGH (ref 0.8–1.2)
Prothrombin Time: 26.6 s — ABNORMAL HIGH (ref 11.4–15.2)

## 2022-03-21 LAB — FERRITIN: Ferritin: 128 ng/mL (ref 11–307)

## 2022-03-21 LAB — IMMATURE PLATELET FRACTION: Immature Platelet Fraction: 4.4 % (ref 1.2–8.6)

## 2022-03-21 LAB — RETICULOCYTES
Immature Retic Fract: 10.1 % (ref 2.3–15.9)
RBC.: 3.13 MIL/uL — ABNORMAL LOW (ref 3.87–5.11)
Retic Count, Absolute: 52.9 10*3/uL (ref 19.0–186.0)
Retic Ct Pct: 1.7 % (ref 0.4–3.1)

## 2022-03-21 LAB — FOLATE: Folate: 10.7 ng/mL (ref >5.9)

## 2022-03-21 LAB — VITAMIN B12: Vitamin B-12: 237 pg/mL (ref 180–914)

## 2022-03-21 MED ORDER — TORSEMIDE 20 MG PO TABS: 20.0000 mg | ORAL_TABLET | Freq: Every day | ORAL | 0 refills | Status: DC | PRN

## 2022-03-21 MED ORDER — WARFARIN SODIUM 2.5 MG PO TABS: 2.5000 mg | ORAL_TABLET | Freq: Once | ORAL | Status: DC

## 2022-03-21 NOTE — Progress Notes (Signed)
[facility] Progress Note   Assessment/ Plan:   Holly Simmons is an 81 y.o. female with HFpEF (h/o HOCM s/p myomectomy), bioprosthetic TV, atrial fibrillation, HL, CKD 3 currently admitted with weakness and nephrology si consulted for evaluation and management of AKI on CKD.    **AKI on CKD 3b:  baseline creatinine in the 1.6-1.'9mg'$ /dL range now with AKI Cr 2.8 setting of presyncopal event and hypovolemia.  Creatinine improved to 2.'1mg'$ /dL and stable today. Renal US unrevealing.  Suspect she has mild tubular injury from a transient hypotensive event.  She currently appears euvolemic.   follow daily labs, I/Os.    - Cr back to baseline after holding torsemide  - will make prn for 2 lb weight gain overnight or 5 lbs in a week  - follow up with Dr Carolin Sicks in 3-4 weeks  - ok to d/c today   **Depression:  h/o such now with worsening symptoms on anniversary of husand's death - defer med adjustment to primary, but would benefit from change as the anorexia led to presentation.    **Weakness/transient aphasia/AMS: underlying mild cognitive impairment, has been eval for CVA - not thought to be.  Likely just an effect of hypovolemia.   **HFpEF: has h/o HOCM s/p myomectomy.  For now would hold her diuretic in light of hypovolemic presentation.  Will need recs on discharge re: parameters for resuming diuretic.     **Anemia: mild Hb in 10s. No indications for ESA currently.   Subjective:    Seen in room.  Cr much better and UOP good.  Dtr at bedside.  Eager for d/c.     Objective:   BP 135/60 (BP Location: Left Arm)   Pulse 69   Temp 98 F (36.7 C) (Oral)   Resp 19   Ht 5' 2.99" (1.6 m)   Wt 71.2 kg   SpO2 91%   BMI 27.80 kg/m   Intake/Output Summary (Last 24 hours) at 03/21/2022 1303 Last data filed at 03/21/2022 1200 Gross per 24 hour  Intake 720 ml  Output 1400 ml  Net -680 ml   Weight change: 0.469 kg  Physical Exam: HKV:[id number] in bed, flat affect CVS:  RRR Resp: clear Abd: soft Ext: no LE edema  Imaging: US RENAL  Result Date: 03/20/2022 CLINICAL DATA:  Acute kidney injury. History diabetes mellitus, hypertension, stage III chronic kidney disease EXAM: RENAL / URINARY TRACT ULTRASOUND COMPLETE COMPARISON:  11/01/2019 FINDINGS: Right Kidney: Renal measurements: 11.2 x 5.3 x 4.8 cm = volume: 148 mL. Cortical thinning. Increased cortical echogenicity. No mass, hydronephrosis, or shadowing calcification. Left Kidney: Renal measurements: 9.7 x 5.9 x 5.1 cm = volume: 154 mL. Cortical thinning. Increased cortical echogenicity. No mass, hydronephrosis, or shadowing calcification. Bladder: Appears normal for degree of bladder distention. Other: N/A IMPRESSION: Cortical atrophy and medical renal disease changes of both kidneys. No evidence of renal mass or hydronephrosis. Electronically Signed   By: Lavonia Dana M.D.   On: 03/20/2022 08:44   CT HEAD WO CONTRAST (5MM)  Result Date: 03/19/2022 CLINICAL DATA:  Neuro deficit, stroke suspected, acute metabolic encephalopathy on chronic cognitive impairment EXAM: CT HEAD WITHOUT CONTRAST TECHNIQUE: Contiguous axial images were obtained from the base of the skull through the vertex without intravenous contrast. RADIATION DOSE REDUCTION: This exam was performed according to the departmental dose-optimization program which includes automated exposure control, adjustment of the mA and/or kV according to patient size and/or use of iterative reconstruction technique. COMPARISON:  03/18/2022 FINDINGS: Brain: No evidence  of acute infarction, hemorrhage, hydrocephalus, extra-axial collection or mass lesion/mass effect. Periventricular and deep white hypodensity. Vascular: No hyperdense vessel or unexpected calcification. Skull: Normal. Negative for fracture or focal lesion. Sinuses/Orbits: No acute finding. Other: None. IMPRESSION: No acute intracranial pathology. Small-vessel white matter disease. Electronically Signed   By:  Delanna Ahmadi M.D.   On: 03/19/2022 18:08   ECHOCARDIOGRAM COMPLETE  Result Date: 03/19/2022    ECHOCARDIOGRAM REPORT   Patient Name:   Holly Simmons Date of Exam: 03/19/2022 Medical Rec #:  [id number]            Height:       63.0 in Accession #:    [id number]           Weight:       156.5 lb Date of Birth:  09/28/1940           BSA:          1.742 m Patient Age:    3 years             BP:           143/63 mmHg Patient Gender: F                    HR:           68 bpm. Exam Location:  Inpatient Procedure: 2D Echo, Cardiac Doppler and Color Doppler Indications:    Murmur  History:        Patient has prior history of Echocardiogram examinations, most                 recent 06/14/2021. COPD; Risk Factors:Hypertension, Diabetes and                 Sleep Apnea.  Sonographer:    Jefferey Pica Referring Phys: Bruno  1. Left ventricular ejection fraction, by estimation, is 50 to 55%. The left ventricle has low normal function. The left ventricle has no regional wall motion abnormalities. There is mild asymmetric left ventricular hypertrophy of the posterior wall segment. Left ventricular diastolic parameters are indeterminate.  2. Right ventricular systolic function is normal. The right ventricular size is normal. There is moderately elevated pulmonary artery systolic pressure.  3. Left atrial size was mildly dilated.  4. Right atrial size was severely dilated.  5. The mitral valve is degenerative. No evidence of mitral valve regurgitation. Mild mitral stenosis. Moderate mitral annular calcification.  6. Bioprosthetic tricuspid valve. TV mean gradient 7.7 mmHg suggesting stenosis of the the bioprosthetic valve.  7. The aortic valve is calcified. Aortic valve regurgitation is mild. Mild aortic valve stenosis. Aortic regurgitation PHT measures 377 msec. Aortic valve mean gradient measures 9.0 mmHg. Aortic valve Vmax measures 2.08 m/s.  8. The inferior vena cava is normal in size  with greater than 50% respiratory variability, suggesting right atrial pressure of 3 mmHg. FINDINGS  Left Ventricle: Left ventricular ejection fraction, by estimation, is 50 to 55%. The left ventricle has low normal function. The left ventricle has no regional wall motion abnormalities. The left ventricular internal cavity size was normal in size. There is mild asymmetric left ventricular hypertrophy of the posterior wall segment. Left ventricular diastolic parameters are indeterminate. Right Ventricle: The right ventricular size is normal. No increase in right ventricular wall thickness. Right ventricular systolic function is normal. There is moderately elevated pulmonary artery systolic pressure. The tricuspid regurgitant velocity is 2.76 m/s, and with an assumed right atrial  pressure of 15 mmHg, the estimated right ventricular systolic pressure is 27.7 mmHg. Left Atrium: Left atrial size was mildly dilated. Right Atrium: Right atrial size was severely dilated. Pericardium: There is no evidence of pericardial effusion. Mitral Valve: The mitral valve is degenerative in appearance. Moderate mitral annular calcification. No evidence of mitral valve regurgitation. Mild mitral valve stenosis. Tricuspid Valve: Bioprosthetic tricuspid valve. TV mean gradient 7.7 mmHg suggesting stenosis of the the bioprosthetic valve. Tricuspid valve regurgitation is not demonstrated. Aortic Valve: The aortic valve is calcified. Aortic valve regurgitation is mild. Aortic regurgitation PHT measures 377 msec. Mild aortic stenosis is present. Aortic valve mean gradient measures 9.0 mmHg. Aortic valve peak gradient measures 17.4 mmHg. Aortic valve area, by VTI measures 2.87 cm. Pulmonic Valve: The pulmonic valve was not well visualized. Pulmonic valve regurgitation is not visualized. No evidence of pulmonic stenosis. Aorta: The aortic root is normal in size and structure. Venous: The inferior vena cava is normal in size with greater than  50% respiratory variability, suggesting right atrial pressure of 3 mmHg. IAS/Shunts: No atrial level shunt detected by color flow Doppler.  LEFT VENTRICLE PLAX 2D LVIDd:         5.00 cm   Diastology LVIDs:         3.80 cm   LV e' medial:    3.09 cm/s LV PW:         1.10 cm   LV E/e' medial:  39.5 LV IVS:        0.80 cm   LV e' lateral:   5.06 cm/s LVOT diam:     2.00 cm   LV E/e' lateral: 24.1 LV SV:         135 LV SV Index:   78 LVOT Area:     3.14 cm  IVC IVC diam: 2.50 cm LEFT ATRIUM             Index        RIGHT ATRIUM           Index LA diam:        4.30 cm 2.47 cm/m   RA Area:     23.40 cm LA Vol (A2C):   59.4 ml 34.09 ml/m  RA Volume:   80.30 ml  46.09 ml/m LA Vol (A4C):   66.8 ml 38.34 ml/m LA Biplane Vol: 64.5 ml 37.02 ml/m  AORTIC VALVE                     PULMONIC VALVE AV Area (Vmax):    3.09 cm      PV Vmax:       0.88 m/s AV Area (Vmean):   2.89 cm      PV Peak grad:  3.1 mmHg AV Area (VTI):     2.87 cm AV Vmax:           208.50 cm/s AV Vmean:          137.000 cm/s AV VTI:            0.471 m AV Peak Grad:      17.4 mmHg AV Mean Grad:      9.0 mmHg LVOT Vmax:         205.00 cm/s LVOT Vmean:        126.000 cm/s LVOT VTI:          0.431 m LVOT/AV VTI ratio: 0.92 AI PHT:            377 msec  AORTA Ao Root diam: 3.30 cm Ao Asc diam:  2.70 cm MITRAL VALVE                TRICUSPID VALVE MV Area (PHT): 3.34 cm     TV Peak grad:   12.1 mmHg MV Decel Time: 227 msec     TV Mean grad:   7.7 mmHg MV E velocity: 122.00 cm/s  TV Vmax:        1.74 m/s MV A velocity: 94.70 cm/s   TV Vmean:       132.0 cm/s MV E/A ratio:  1.29         TV VTI:         0.83 msec                             TR Peak grad:   30.5 mmHg                             TR Vmax:        276.00 cm/s                              SHUNTS                             Systemic VTI:  0.43 m                             Systemic Diam: 2.00 cm Kardie Tobb DO Electronically signed by Berniece Salines DO Signature Date/Time: 03/19/2022/4:05:25 PM    Final      Labs: BMET Recent Labs  Lab 03/18/22 1555 03/18/22 1601 03/19/22 0042 03/20/22 0434 03/21/22 0415  NA 137 139 138 138 139  K 4.3 4.4 4.0 4.0 4.0  CL 99 97* 105 106 106  CO2 30  --  '26 24 27  '$ GLUCOSE 120* 112* 84 73 85  BUN 28* 31* 27* 25* 26*  CREATININE 2.48* 2.70* 2.13* 2.12* 1.96*  CALCIUM 10.1  --  9.3 9.6 10.0   CBC Recent Labs  Lab 03/18/22 1555 03/18/22 1601 03/19/22 0042 03/20/22 0434 03/21/22 0415  WBC 4.6  --  3.4* 3.3* 3.5*  NEUTROABS 3.3  --   --   --   --   HGB 11.5* 12.2 10.1* 10.0* 9.9*  HCT 36.2 36.0 31.1* 30.6* 30.8*  MCV 100.6*  --  101.0* 99.0 98.4  PLT 131*  --  102* 84* 84*    Medications:     allopurinol  100 mg Oral Daily   buPROPion  300 mg Oral Daily   dronabinol  5 mg Oral BID AC   famotidine  10 mg Oral Daily   feeding supplement  237 mL Oral BID BM   ferrous sulfate  325 mg Oral BID WC   lamoTRIgine  25 mg Oral Daily   metoprolol tartrate  25 mg Oral BID   mometasone-formoterol  2 puff Inhalation BID   multivitamin with minerals  1 tablet Oral Daily   pravastatin  20 mg Oral q1800   Vilazodone HCl  40 mg Oral Daily   warfarin  2.5 mg Oral ONCE-1600   Warfarin - Pharmacist Dosing Inpatient   Does not apply q1600    Madelon Lips, MD  03/21/2022, 1:03 PM

## 2022-03-21 NOTE — Care Management Important Message (Signed)
Important Message  Patient Details  Name: Holly Simmons MRN: [id number] Date of Birth: 10-May-1941   Medicare Important Message Given:  Yes     Orbie Pyo 03/21/2022, 2:01 PM

## 2022-03-21 NOTE — Evaluation (Signed)
Speech Language Pathology Evaluation Patient Details Name: Holly Simmons MRN: [id number] DOB: [birth year] Today's Date: 03/21/2022 Time: 2774-1287 SLP Time Calculation (min) (ACUTE ONLY): 25 min  Problem List:  Patient Active Problem List   Diagnosis Date Noted   AKI (acute kidney injury) ([place]) 03/18/2022   Orthostatic hypotension 03/18/2022   Near syncope 03/18/2022   Severe episode of recurrent major depressive disorder, without psychotic features ([place]) 02/26/2022   Chronic kidney disease, stage 3b ([place]) 09/05/2021   PAF (paroxysmal atrial fibrillation) ([place]) 08/31/2021   Thrombocytopenia ([place]) 12/04/2020   Encounter for general adult medical examination with abnormal findings 07/12/2020   Pharyngeal dysphagia 07/12/2020   Cachexia ([place]) 08/16/2019   Iron deficiency anemia due to chronic blood loss 09/23/2018   CKD (chronic kidney disease) stage 5, GFR less than 15 ml/min (HCC) 09/12/2018   Microalbuminuria due to type 2 diabetes mellitus ([place]) 04/21/2018   Obesity (BMI 30.0-34.9) 02/05/2016   Osteoporosis with pathological fracture of ankle and foot 05/30/2015   Encounter for therapeutic drug monitoring 11/22/2014   Gout 08/20/2012   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 06/10/2012   Other screening mammogram 02/04/2012   Hyperlipidemia LDL goal <130 12/04/2011   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   Long term (current) use of anticoagulants 07/18/2011   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   Hypersomnia 08/16/2009   Chronic diastolic heart failure ([place]) 08/25/2008   GERD 08/10/2008   Obstructive sleep apnea 08/10/2007   Allergic rhinitis 08/09/2007   Past Medical History:  Past Medical History:  Diagnosis Date   Allergic rhinitis    Asthma    NOS w/ acute exacerbation   Blood transfusion without reported diagnosis    CKD (chronic kidney disease) stage 3, GFR 30-59 ml/min (HCC)  09/12/2018   Colon polyps    TUBULAR ADENOMAS AND HYPERPLASTIC   Complete heart block (HCC)    requiring PPM (MDT) post surgical myomectomy at [facility],  leads are epicardial with abdominal implant, high ventricular threshold at implant   COPD (chronic obstructive pulmonary disease) (HCC)    Depressive disorder    Diastolic dysfunction    DM (diabetes mellitus) ([place])    Gallstones    GERD (gastroesophageal reflux disease)    Gout 08/20/2012   Heart murmur    Hyperpotassemia    Hypersomnia    Hypertension    Hypertrophic cardiomyopathy ([place])    s/p surgical myomectomy at [facility] 86/76 complicated by septal VSD post procedure requiring reoperation with patch closure and tricuspid valve replacement   Kidney stones    Myocardial infarction ([place]) 2011   Obstructive sleep apnea    persistent daytime sleepiness despite cpap   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   Psoriasis    Pyuria    Renal insufficiency    Tricuspid valve replaced    MDT 84m Mosaic Valve   Typical atrial flutter ([place] 9/15   Past Surgical History:  Past Surgical History:  Procedure Laterality Date   ABDOMINAL HYSTERECTOMY     APPENDECTOMY     BREAST CYST EXCISION     CARDIOVERSION N/A 08/01/2014   Procedure: CARDIOVERSION;  Surgeon: PThayer Headings MD;  [facility];  Service: Cardiovascular;  Laterality: N/A;   CESAREAN SECTION     CHOLECYSTECTOMY     COLONOSCOPY     EYE SURGERY  [id number]  left eye lens implant   EYE SURGERY  [id number]  right eye lens implant   INSERT / REPLACE / REMOVE PACEMAKER  07/13/2012   PACEMAKER INSERTION  07/02/11   epicardial wires with abdominal implant at [facility] 12/12,  high ventricular lead threshold at implant per Dr Westley Gambles   PERMANENT PACEMAKER INSERTION N/A 07/13/2012   Procedure: PERMANENT PACEMAKER INSERTION;  Surgeon: Thompson Grayer, MD;  Location: [facility];  Service: Cardiovascular;  Laterality: N/A;   POLYPECTOMY     septal myomectomy for hypertrophic CM  06/23/11   by Dr  Evelina Dun at [facility], complicated by septal VSD requiring patch repair and tricuspid valve replacement   TONSILLECTOMY     TRICUSPID VALVE REPLACEMENT  11/12   Medtronic 32m Mosaic tissue valve   VSD REPAIR  06/2011   HPI:  [id number] admitted 03/18/22 with generalized weakness, near syncopal episode, aphasia. CTHead = Chronic ischemic changes without acute abnormality. No significant interval change from the prior study. PMH: HLD, HTN, CKD3b, DM, depression, chronic diastolic heart failure (hypertrophic cardiomyopathy status post myomectomy), bioprosthetic tricuspid valve, AFib, COPD, GERD, OSA, cognitive impairment without formal diagnosis .   Assessment / Plan / Recommendation Clinical Impression  Pt presents with susepcted exacerbation of baseline cognitive deficits. Per daughter, pt lives with her and family takes care of meds, bills, and meal prep. SHarrah's EntertainmentMental Status Examination (SLUMS) administered with the pt scoring the following: 18/30, which is outside the normal limits of 27-30. She is oriented to all concepts and able to complete simple mathematical calculations with 100% accuracy. Otherwise, immediate (2/5, then 4/5 with heavy repetitions), delayed recall (4/5), paragraph recall with question prompts (2/8) and tasks related to higher level executive functions/sequencing were poor. Family reports that pt's presentation this date is similar to, but not fully baseline. SLP services to f/u acutely, although do not anticipate any need for f/u post discharge. Informed family that SLP services can be pursued, post hospitalization, if acute exacerbation doesn't resolve. Will f/u.    SLP Assessment  SLP Recommendation/Assessment: Patient needs continued Speech [facility] [facility] SLP Visit Diagnosis: Cognitive communication deficit (R41.841)    Recommendations for follow up therapy are one component of a multi-disciplinary discharge planning process, led by the attending  physician.  Recommendations may be updated based on patient status, additional functional criteria and insurance authorization.    Follow Up Recommendations  No SLP follow up    Assistance Recommended at Discharge  Frequent or constant Supervision/Assistance  Functional Status Assessment Patient has had a recent decline in their functional status and demonstrates the ability to make significant improvements in function in a reasonable and predictable amount of time.  Frequency and Duration min 2x/week  2 weeks      SLP Evaluation Cognition  Overall Cognitive Status: Impaired/Different from baseline Arousal/Alertness: Awake/alert Orientation Level: Oriented X4 Year: 2023 Month: August Day of Week: Correct Attention: Sustained Sustained Attention: Appears intact Memory: Impaired Memory Impairment: Storage deficit;Retrieval deficit;Decreased recall of new information Problem Solving: Impaired Problem Solving Impairment: Functional complex;Verbal complex Executive Function: Organizing;Sequencing;Self Monitoring;Self Correcting Sequencing: Impaired Sequencing Impairment: Verbal complex Organizing: Impaired Organizing Impairment: Verbal complex Self Monitoring: Impaired Self Monitoring Impairment: Verbal complex Self Correcting: Impaired Self Correcting Impairment: Verbal complex       Comprehension  Auditory Comprehension Overall Auditory Comprehension: Appears within functional limits for tasks assessed Visual Recognition/Discrimination Discrimination: Not tested    Expression Expression Primary Mode of Expression: Verbal Verbal Expression Overall Verbal Expression: Appears within functional limits for tasks assessed Written Expression Written Expression: Not tested   Oral / Motor  Oral Motor/Sensory Function Overall Oral Motor/Sensory Function: Within functional  limits Motor Speech Overall Motor Speech: Appears within functional limits for tasks assessed              Ellwood Dense, [place], [place] Office Number: [phone]  Acie Fredrickson 03/21/2022, 9:46 AM

## 2022-03-21 NOTE — Discharge Summary (Incomplete)
Physician Discharge Summary   Patient: Holly Simmons MRN: [id number] DOB: [birth year]  Admit date:     03/18/2022  Discharge date: 03/22/22  Discharge Physician: Holly Simmons   PCP: Holly Lima, MD   Recommendations at discharge:  Follow up with PCP 8/21. Recommend recheck of CBC for pancytopenia that is not acute, and BMP with attention to renal function.  Follow up with nephrology, Holly Simmons in 3-4 weeks. Nephrology consultant, Holly Simmons, recommends taking torsemide on an as needed basis for 2lbs in a day and/or 5lbs in a week weight gain.  Follow up with cardiology, pt of Holly Simmons, routinely. Note stable severe RAE and velocity by echo suggestive of stenosis of bioprosthetic TV. Suggest continued discussions of medical therapy for depression with patient and family.  Discharge Diagnoses: Principal Problem:   AKI (acute kidney injury) ([place]) Active Problems:   Obstructive sleep apnea   Chronic diastolic heart failure (HCC)   Hyperlipidemia LDL goal <130   Stage 3b chronic kidney disease (CKD) (HCC)   Orthostatic hypotension   Near syncope  Hospital Course: Holly Simmons is a 81 y.o. female with a history of stage IV CKD, chronic HFpEF, hypertrophic cardiomyopathy s/p myomectomy 2012, CHB s/p PPM, bioprosthetic TV, atrial fibrillation on coumadin, HLD, cognitive impairment without formal diagnosis who presented to the ED due to aphasia, generalized weakness, near syncope in the setting of decreased (subacute/chronically) oral intake. Found to have AKI. Troponin normal x2, ECG paced. CT head showed no acute abnormalities, CTA head and neck showed chronic ischemic changes, mild nonobstructive atherosclerosis. Neurology was consulted, IV fluids started and patient admitted with concern for prerenal azotemia causing acute encephalopathy. Neurology has low suspicion for stroke.   With IV fluids and PT/OT the patient's mental and physical function have returned near her  baseline. Renal function improved and she is stable for discharge. Please see details below.  Assessment and Plan: AKI on stage IIIb CKD: Improving with IV fluids, dehydrated at intake by history and exam. Baseline Cr 1.6-1.9 per nephrology - Follow up with nephrology, Holly Simmons in 3-4 weeks with repeat BMP - Maintain adequate hydration, stop standing diuretic, instead prescribe torsemide only prn 2lbs weight gain in a day or 5lbs in a week - Taking adequate po  - Cr improved to 1.96 on day of discharge.   Weakness, transient aphasia: Nonfocal, improving. This has occurred in the past with dehydration, usually improved enough initially with IVF to be sent home. This was the worst time.  - PT/OT/SLP consulted, pt is improving near her baseline.   Acute metabolic encephalopathy on chronic cognitive impairment, depression: Patient has had abnormal cognitive function for many years without a formal diagnosis. Mentation is improving.  - Consider formal evaluation and consideration of aricept Tx if confirmed - Continue vilazodone and bupropion for mood disorder.  - CT showed no acute abnormalities. Unable to pursue MRI, so repeat CT performed and continues to show no stroke.    Chronic HFpEF, hypertrophic cardiomyopathy s/p myomectomy 2012, CHB s/p PPM, bioprosthetic TV, atrial fibrillation on coumadin: Echocardiogram similar to prior with preserved LV systolic function, tricuspid stenosis noted with severe RAE which was present previously. IVC normal, normal RV function, elevated PASP.  - Continue metoprolol and coumadin dosing per pharmacy, has been on stable dose.  - Cardiac monitoring has remained stable  - Recommend follow up with cardiology for tricuspid valve stenosis.    COPD: Chronic, stable. No wheezing - Continue controller med, prn albuterol.  HLD:  - Continue statin   Thrombocytopenia: Chronic, no bleeding, a bit worse again today (all cell lines down with IVF however) -  Immature fraction not elevated, could consider work up for hypoproliferative disorder with concomitant cytopenias.    Leukopenia: Unclear significance, had this in Feb 2023 as well. Could consider work up if persistent. AlkPhos elevated very mildly, will suggest recheck, other LFTs wnl.   Anemia: Initially macrocytic, now normocytic. Presumably due to CKD. Stable without bleeding. Also on iron which we'll continue. 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 - Outpatient follow up recommended  Consultants: Nephrology Procedures performed: None  Disposition: Home Diet recommendation:  Regular diet DISCHARGE MEDICATION: Allergies as of 03/21/2022       Reactions   Petrolatum-zinc Oxide Anaphylaxis, Rash, Swelling   Sulfa Antibiotics Rash   Sulfonamide Derivatives Anaphylaxis, Swelling   Tetanus Toxoid    Other reaction(s): Other (See Comments) OTHER REACTION   Tetanus Toxoids Swelling   Other Rash, Other (See Comments)   STERI - STRIPS  It can affect proteins in her kidneys so she doesn't take REACTION: proteinuria Other reaction(s): Other (See Comments), Unknown It can affect proteins in her kidneys so she doesn't take   Nsaids Other (See Comments)   REACTION: proteinuria Other reaction(s): Other (See Comments), Unknown It can affect proteins in her kidneys so she doesn't take   Tetanus Toxoid, Adsorbed Other (See Comments)   Other reaction(s): Other (See Comments) Turned arm red Turned arm red        Medication List     TAKE these medications    allopurinol 100 MG tablet Commonly known as: ZYLOPRIM Take 1 tablet (100 mg total) by mouth daily.   amoxicillin 500 MG capsule Commonly known as: AMOXIL TAKE 4 CAPSULES 1 HOUR PRIOR TO PROCEDURE What changed: See the new instructions.   ARIPiprazole 2 MG tablet Commonly known as: Abilify Take 1 tablet (2 mg total) by mouth daily.    Betamethasone Valerate 0.12 % foam APPLY TO SCALP AS DIRECTED. What changed:  how much to take when to take this additional instructions   buPROPion 300 MG 24 hr tablet Commonly known as: WELLBUTRIN XL TAKE ONE TABLET BY MOUTH ONCE DAILY   dronabinol 5 MG capsule Commonly known as: MARINOL Take 1 capsule (5 mg total) by mouth 2 (two) times daily before lunch and supper.   famotidine 40 MG tablet Commonly known as: PEPCID Take 1 tablet (40 mg total) by mouth daily.   ferrous sulfate 325 (65 FE) MG tablet Take 1 tablet (325 mg total) by mouth 2 (two) times daily with a meal. What changed:  how much to take when to take this   ipratropium 0.03 % nasal spray Commonly known as: ATROVENT USE 2 SPRAYS IN EACH NOSTRIL 3 TIMES A DAY AS NEEDED. What changed: See the new instructions.   lamoTRIgine 25 MG tablet Commonly known as: LAMICTAL TAKE ONE TABLET BY MOUTH ONCE DAILY   metoprolol tartrate 25 MG tablet Commonly known as: LOPRESSOR Take 1 tablet (25 mg total) by mouth 2 (two) times daily.   modafinil 100 MG tablet Commonly known as: PROVIGIL Take 1 tablet (100 mg total) by mouth daily. What changed:  when to take this reasons to take this   Pitavastatin Calcium 2 MG Tabs Take 1 tablet (2 mg total) by mouth daily.   PRESCRIPTION MEDICATION 1 Dose by Other route  at bedtime as needed (sleep). CPAP   Symbicort 80-4.5 MCG/ACT inhaler Generic drug: budesonide-formoterol USE 2 PUFFS TWICE A DAY. What changed: See the new instructions.   SYSTANE BALANCE OP Place 1 drop into both eyes 4 (four) times daily as needed (dry eyes).   torsemide 20 MG tablet Commonly known as: DEMADEX Take 1 tablet (20 mg total) by mouth daily as needed (for 2 lb weight gain overnight or 5 lbs in a week). What changed:  when to take this reasons to take this   Vilazodone HCl 40 MG Tabs Commonly known as: VIIBRYD Take 1 tablet (40 mg total) by mouth daily.   warfarin 1 MG  tablet Commonly known as: COUMADIN Take as directed. If you are unsure how to take this medication, talk to your nurse or doctor. Original instructions: Take 1 tablet (1 mg total) by mouth daily. TAKE 1 1/2 TABLET EVERY WEDNESDAY OR AS DIRECTED BY ANTICOAGULATION LCINIC What changed:  how much to take when to take this   warfarin 2.5 MG tablet Commonly known as: COUMADIN Take as directed. If you are unsure how to take this medication, talk to your nurse or doctor. Original instructions: TAKE 1 TABLET BY MOUTH DAILY EXCEPT NONE ON WEDNESDAYS OR AS DIRECTED BY ANTICOAGULATION CLINIC What changed:  how much to take how to take this when to take this additional instructions        Follow-up Information     Holly Lima, MD. Go on 03/24/2022.   Specialty: Internal Medicine Why: '@3'$ :40pm Contact information: [place] [address] [phone]         Rosita Fire, MD. Schedule an appointment as soon as possible for a visit in 3 week(s).   Specialties: Nephrology, Internal Medicine Contact information: [place] [address] [phone]         Josue Hector, MD. Schedule an appointment as soon as possible for a visit in 4 week(s).   Specialty: Cardiology Contact information: [address] [place] [address] [phone]                Discharge Exam: Danley Danker Weights   03/19/22 1536 03/20/22 0031 03/21/22 0200  Weight: 70.7 kg 71.4 kg 71.2 kg  Pleasant, alert, interactive female in no distress RRR, soft apical systolic murmur, no LE edema Clear, nonlabored Soft, NT, ND, +BS  Condition at discharge: stable  The results of significant diagnostics from this hospitalization (including imaging, microbiology, ancillary and laboratory) are listed below for reference.   Imaging Studies: US RENAL  Result Date: 03/20/2022 CLINICAL DATA:  Acute kidney injury. History diabetes mellitus, hypertension,  stage III chronic kidney disease EXAM: RENAL / URINARY TRACT ULTRASOUND COMPLETE COMPARISON:  11/01/2019 FINDINGS: Right Kidney: Renal measurements: 11.2 x 5.3 x 4.8 cm = volume: 148 mL. Cortical thinning. Increased cortical echogenicity. No mass, hydronephrosis, or shadowing calcification. Left Kidney: Renal measurements: 9.7 x 5.9 x 5.1 cm = volume: 154 mL. Cortical thinning. Increased cortical echogenicity. No mass, hydronephrosis, or shadowing calcification. Bladder: Appears normal for degree of bladder distention. Other: N/A IMPRESSION: Cortical atrophy and medical renal disease changes of both kidneys. No evidence of renal mass or hydronephrosis. Electronically Signed   By: Lavonia Dana M.D.   On: 03/20/2022 08:44   CT HEAD WO CONTRAST (5MM)  Result Date: 03/19/2022 CLINICAL DATA:  Neuro deficit, stroke suspected, acute metabolic encephalopathy on chronic cognitive impairment EXAM: CT HEAD WITHOUT CONTRAST TECHNIQUE: Contiguous axial images were  obtained from the base of the skull through the vertex without intravenous contrast. RADIATION DOSE REDUCTION: This exam was performed according to the departmental dose-optimization program which includes automated exposure control, adjustment of the mA and/or kV according to patient size and/or use of iterative reconstruction technique. COMPARISON:  03/18/2022 FINDINGS: Brain: No evidence of acute infarction, hemorrhage, hydrocephalus, extra-axial collection or mass lesion/mass effect. Periventricular and deep white hypodensity. Vascular: No hyperdense vessel or unexpected calcification. Skull: Normal. Negative for fracture or focal lesion. Sinuses/Orbits: No acute finding. Other: None. IMPRESSION: No acute intracranial pathology. Small-vessel white matter disease. Electronically Signed   By: Delanna Ahmadi M.D.   On: 03/19/2022 18:08   ECHOCARDIOGRAM COMPLETE  Result Date: 03/19/2022    ECHOCARDIOGRAM REPORT   Patient Name:   Holly Simmons Date of Exam:  03/19/2022 Medical Rec #:  [id number]            Height:       63.0 in Accession #:    [id number]           Weight:       156.5 lb Date of Birth:  1941-03-02           BSA:          1.742 m Patient Age:    64 years             BP:           143/63 mmHg Patient Gender: F                    HR:           68 bpm. Exam Location:  Inpatient Procedure: 2D Echo, Cardiac Doppler and Color Doppler Indications:    Murmur  History:        Patient has prior history of Echocardiogram examinations, most                 recent 06/14/2021. COPD; Risk Factors:Hypertension, Diabetes and                 Sleep Apnea.  Sonographer:    Jefferey Pica Referring Phys: [place]  1. Left ventricular ejection fraction, by estimation, is 50 to 55%. The left ventricle has low normal function. The left ventricle has no regional wall motion abnormalities. There is mild asymmetric left ventricular hypertrophy of the posterior wall segment. Left ventricular diastolic parameters are indeterminate.  2. Right ventricular systolic function is normal. The right ventricular size is normal. There is moderately elevated pulmonary artery systolic pressure.  3. Left atrial size was mildly dilated.  4. Right atrial size was severely dilated.  5. The mitral valve is degenerative. No evidence of mitral valve regurgitation. Mild mitral stenosis. Moderate mitral annular calcification.  6. Bioprosthetic tricuspid valve. TV mean gradient 7.7 mmHg suggesting stenosis of the the bioprosthetic valve.  7. The aortic valve is calcified. Aortic valve regurgitation is mild. Mild aortic valve stenosis. Aortic regurgitation PHT measures 377 msec. Aortic valve mean gradient measures 9.0 mmHg. Aortic valve Vmax measures 2.08 m/s.  8. The inferior vena cava is normal in size with greater than 50% respiratory variability, suggesting right atrial pressure of 3 mmHg. FINDINGS  Left Ventricle: Left ventricular ejection fraction, by estimation, is 50 to  55%. The left ventricle has low normal function. The left ventricle has no regional wall motion abnormalities. The left ventricular internal cavity size was normal in size. There is mild asymmetric left ventricular  hypertrophy of the posterior wall segment. Left ventricular diastolic parameters are indeterminate. Right Ventricle: The right ventricular size is normal. No increase in right ventricular wall thickness. Right ventricular systolic function is normal. There is moderately elevated pulmonary artery systolic pressure. The tricuspid regurgitant velocity is 2.76 m/s, and with an assumed right atrial pressure of 15 mmHg, the estimated right ventricular systolic pressure is 69.4 mmHg. Left Atrium: Left atrial size was mildly dilated. Right Atrium: Right atrial size was severely dilated. Pericardium: There is no evidence of pericardial effusion. Mitral Valve: The mitral valve is degenerative in appearance. Moderate mitral annular calcification. No evidence of mitral valve regurgitation. Mild mitral valve stenosis. Tricuspid Valve: Bioprosthetic tricuspid valve. TV mean gradient 7.7 mmHg suggesting stenosis of the the bioprosthetic valve. Tricuspid valve regurgitation is not demonstrated. Aortic Valve: The aortic valve is calcified. Aortic valve regurgitation is mild. Aortic regurgitation PHT measures 377 msec. Mild aortic stenosis is present. Aortic valve mean gradient measures 9.0 mmHg. Aortic valve peak gradient measures 17.4 mmHg. Aortic valve area, by VTI measures 2.87 cm. Pulmonic Valve: The pulmonic valve was not well visualized. Pulmonic valve regurgitation is not visualized. No evidence of pulmonic stenosis. Aorta: The aortic root is normal in size and structure. Venous: The inferior vena cava is normal in size with greater than 50% respiratory variability, suggesting right atrial pressure of 3 mmHg. IAS/Shunts: No atrial level shunt detected by color flow Doppler.  LEFT VENTRICLE PLAX 2D LVIDd:          5.00 cm   Diastology LVIDs:         3.80 cm   LV e' medial:    3.09 cm/s LV PW:         1.10 cm   LV E/e' medial:  39.5 LV IVS:        0.80 cm   LV e' lateral:   5.06 cm/s LVOT diam:     2.00 cm   LV E/e' lateral: 24.1 LV SV:         135 LV SV Index:   78 LVOT Area:     3.14 cm  IVC IVC diam: 2.50 cm LEFT ATRIUM             Index        RIGHT ATRIUM           Index LA diam:        4.30 cm 2.47 cm/m   RA Area:     23.40 cm LA Vol (A2C):   59.4 ml 34.09 ml/m  RA Volume:   80.30 ml  46.09 ml/m LA Vol (A4C):   66.8 ml 38.34 ml/m LA Biplane Vol: 64.5 ml 37.02 ml/m  AORTIC VALVE                     PULMONIC VALVE AV Area (Vmax):    3.09 cm      PV Vmax:       0.88 m/s AV Area (Vmean):   2.89 cm      PV Peak grad:  3.1 mmHg AV Area (VTI):     2.87 cm AV Vmax:           208.50 cm/s AV Vmean:          137.000 cm/s AV VTI:            0.471 m AV Peak Grad:      17.4 mmHg AV Mean Grad:      9.0 mmHg LVOT  Vmax:         205.00 cm/s LVOT Vmean:        126.000 cm/s LVOT VTI:          0.431 m LVOT/AV VTI ratio: 0.92 AI PHT:            377 msec  AORTA Ao Root diam: 3.30 cm Ao Asc diam:  2.70 cm MITRAL VALVE                TRICUSPID VALVE MV Area (PHT): 3.34 cm     TV Peak grad:   12.1 mmHg MV Decel Time: 227 msec     TV Mean grad:   7.7 mmHg MV E velocity: 122.00 cm/s  TV Vmax:        1.74 m/s MV A velocity: 94.70 cm/s   TV Vmean:       132.0 cm/s MV E/A ratio:  1.29         TV VTI:         0.83 msec                             TR Peak grad:   30.5 mmHg                             TR Vmax:        276.00 cm/s                              SHUNTS                             Systemic VTI:  0.43 m                             Systemic Diam: 2.00 cm Kardie Tobb DO Electronically signed by Berniece Salines DO Signature Date/Time: 03/19/2022/4:05:25 PM    Final    CT HEAD WO CONTRAST  Result Date: 03/18/2022 CLINICAL DATA:  Recent syncopal episode EXAM: CT HEAD WITHOUT CONTRAST TECHNIQUE: Contiguous axial images were obtained from  the base of the skull through the vertex without intravenous contrast. RADIATION DOSE REDUCTION: This exam was performed according to the departmental dose-optimization program which includes automated exposure control, adjustment of the mA and/or kV according to patient size and/or use of iterative reconstruction technique. COMPARISON:  CT from earlier in the same day. FINDINGS: Brain: No evidence of acute infarction, hemorrhage, hydrocephalus, extra-axial collection or mass lesion/mass effect. Chronic ischemic changes are again noted and stable. Vascular: No hyperdense vessel or unexpected calcification. Skull: Normal. Negative for fracture or focal lesion. Sinuses/Orbits: No acute finding. Other: None. IMPRESSION: Chronic ischemic changes without acute abnormality. No significant interval change from the prior study. Electronically Signed   By: Inez Catalina M.D.   On: 03/18/2022 22:20   CT ANGIO HEAD NECK W WO CM (CODE STROKE)  Result Date: 03/18/2022 CLINICAL DATA:  Acute neuro deficit.  Stroke EXAM: CT ANGIOGRAPHY HEAD AND NECK TECHNIQUE: Multidetector CT imaging of the head and neck was performed using the standard protocol during bolus administration of intravenous contrast. Multiplanar CT image reconstructions and MIPs were obtained to evaluate the vascular anatomy. Carotid stenosis measurements (when applicable) are obtained utilizing NASCET criteria, using the distal internal carotid diameter as  the denominator. RADIATION DOSE REDUCTION: This exam was performed according to the departmental dose-optimization program which includes automated exposure control, adjustment of the mA and/or kV according to patient size and/or use of iterative reconstruction technique. CONTRAST:  68m OMNIPAQUE IOHEXOL 350 MG/ML SOLN COMPARISON:  CT head 03/18/2022 FINDINGS: CTA NECK FINDINGS Aortic arch: Standard branching. Imaged portion shows no evidence of aneurysm or dissection. No significant stenosis of the major arch  vessel origins. Right carotid system: Mild atherosclerotic disease right carotid bifurcation. No significant right carotid stenosis. Beaded appearance of the right internal carotid artery compatible with fibromuscular dysplasia. No stenosis or dissection Left carotid system: Mild atherosclerotic disease left carotid bifurcation without stenosis. Beaded appearance left internal carotid artery compatible with fibromuscular dysplasia. No dissection or stenosis. Vertebral arteries: Both vertebral arteries are patent to the skull base without stenosis. Skeleton: Cervical spondylosis. No acute skeletal abnormality. Dental caries. Other neck: Negative for mass or edema. Gas in the veins in the left neck most likely introduced at vena puncture. Upper chest: Lung apices clear bilaterally Review of the MIP images confirms the above findings CTA HEAD FINDINGS Anterior circulation: Atherosclerotic calcification in the cavernous carotid bilaterally causing mild stenosis bilaterally. Anterior middle cerebral arteries opacified normally without stenosis or large vessel occlusion. 3 mm aneurysm of the supraclinoid internal carotid artery projecting posterolaterally. Probable posterior communicating artery aneurysm. Posterior circulation: Both vertebral arteries patent to the basilar. PICA patent bilaterally. Basilar widely patent. Superior cerebellar and posterior cerebral arteries patent bilaterally. No stenosis or aneurysm Venous sinuses: Limited venous enhancement due to arterial phase scanning Anatomic variants: None Review of the MIP images confirms the above findings IMPRESSION: 1. No intracranial large vessel occlusion or significant stenosis 2. Mild atherosclerotic disease carotid bifurcation bilaterally without stenosis. 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 3. Both vertebral arteries widely patent 4. 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 Electronically Signed   By: CFranchot GalloM.D.   On: 03/18/2022 16:27   CT HEAD CODE STROKE WO CONTRAST  Result Date: 03/18/2022 CLINICAL DATA:  Code stroke.  Acute neuro deficit. EXAM: CT HEAD WITHOUT CONTRAST TECHNIQUE: Contiguous axial images were obtained from the base of the skull through the vertex without intravenous contrast. RADIATION DOSE REDUCTION: This exam was performed according to the departmental dose-optimization program which includes automated exposure control, adjustment of the mA and/or kV according to patient size and/or use of iterative reconstruction technique. COMPARISON:  CT head 07/12/2020 FINDINGS: Brain: Ventricle size normal. Progression of white matter hypodensity diffusely which appears chronic. Negative for acute infarct, hemorrhage, mass. Vascular: Negative for hyperdense vessel Skull: No focal abnormality. Sinuses/Orbits: Paranasal sinuses clear. Bilateral cataract extraction Other: None ASPECTS ([place]roke Program Early CT Score) - Ganglionic level infarction (caudate, lentiform nuclei, internal capsule, insula, M1-M3 cortex): 7 - Supraganglionic infarction (M4-M6 cortex): 3 Total score (0-10 with 10 being normal): 10 IMPRESSION: 1. No acute abnormality 2. ASPECTS is 10 3. Progression of chronic microvascular ischemia since 2021 4. Code stroke imaging results were communicated on 03/18/2022 at 4:06 pm to provider KLorrin Goodellvia text page Electronically Signed   By: CFranchot GalloM.D.   On: 03/18/2022 16:08    Microbiology: Results for orders placed or performed during the hospital encounter of 03/18/22  Resp Panel by RT-PCR (Flu A&B, Covid) Anterior Nasal Swab     Status: None   Collection Time: 03/18/22  3:55 PM   Specimen: Anterior Nasal Swab  Result Value Ref Range Status   SARS Coronavirus 2 by RT PCR NEGATIVE NEGATIVE Final    Comment: (NOTE) SARS-CoV-2 target nucleic acids are NOT DETECTED.  The SARS-CoV-2 RNA is generally  detectable in upper respiratory specimens during the acute phase of infection. The lowest concentration of SARS-CoV-2 viral copies this assay can detect is 138 copies/mL. A negative result does not preclude SARS-Cov-2 infection and should not be used as the sole basis for treatment or other patient management decisions. A negative result may occur with  improper specimen collection/handling, submission of specimen other than nasopharyngeal swab, presence of viral mutation(s) within the areas targeted by this assay, and inadequate number of viral copies(<138 copies/mL). A negative result must be combined with clinical observations, patient history, and epidemiological information. The expected result is Negative.  Fact Sheet for Patients:  EntrepreneurPulse.com.au  Fact Sheet for Healthcare Providers:  IncredibleEmployment.be  This test is no t yet approved or cleared by the [place] FDA and  has been authorized for detection and/or diagnosis of SARS-CoV-2 by FDA under an Emergency Use Authorization (EUA). This EUA will remain  in effect (meaning this test can be used) for the duration of the COVID-19 declaration under Section 564(b)(1) of the Act, 21 U.S.C.section 360bbb-3(b)(1), unless the authorization is terminated  or revoked sooner.       Influenza A by PCR NEGATIVE NEGATIVE Final   Influenza B by PCR NEGATIVE NEGATIVE Final    Comment: (NOTE) The Xpert Xpress SARS-CoV-2/FLU/RSV plus assay is intended as an aid in the diagnosis of influenza from Nasopharyngeal swab specimens and should not be used as a sole basis for treatment. Nasal washings and aspirates are unacceptable for Xpert Xpress SARS-CoV-2/FLU/RSV testing.  Fact Sheet for Patients: EntrepreneurPulse.com.au  Fact Sheet for Healthcare Providers: IncredibleEmployment.be  This test is not yet approved or cleared by the [place] FDA  and has been authorized for detection and/or diagnosis of SARS-CoV-2 by FDA under an Emergency Use Authorization (EUA). This EUA will remain in effect (meaning this test can be used) for the duration of the COVID-19 declaration under Section 564(b)(1) of the Act, 21 U.S.C. section 360bbb-3(b)(1), unless the authorization is terminated or revoked.  Performed at [facility], [place] [address], [place], [place] [address]    *Note: Due to a large number of results and/or encounters for the requested time period, some results have not been displayed. A complete set of results can be found in Results Review.    Labs: CBC: Recent Labs  Lab 03/18/22 1555 03/18/22 1601 03/19/22 0042 03/20/22 0434 03/21/22 0415  WBC 4.6  --  3.4* 3.3* 3.5*  NEUTROABS 3.3  --   --   --   --   HGB 11.5* 12.2 10.1* 10.0* 9.9*  HCT 36.2 36.0 31.1* 30.6* 30.8*  MCV 100.6*  --  101.0* 99.0 98.4  PLT 131*  --  102* 84* 84*   Basic Metabolic Panel: Recent Labs  Lab 03/18/22 1555 03/18/22 1601 03/18/22 2024 03/19/22 0042 03/20/22 0434 03/21/22 0415  NA 137 139  --  138 138 139  K 4.3 4.4  --  4.0 4.0 4.0  CL 99 97*  --  105 106 106  CO2 30  --   --  '26 24 27  '$ GLUCOSE 120* 112*  --  84 73 85  BUN 28* 31*  --  27* 25* 26*  CREATININE 2.48* 2.70*  --  2.13* 2.12* 1.96*  CALCIUM 10.1  --   --  9.3 9.6 10.0  MG  --   --  2.0  --   --   --    Liver Function Tests: Recent Labs  Lab 03/18/22 1555  AST 16  ALT 12  ALKPHOS 131*  BILITOT 0.9  PROT 6.8  ALBUMIN 3.8   CBG: Recent Labs  Lab 03/18/22 1555  GLUCAP 120*    Discharge time spent: greater than 30 minutes.  Signed: Patrecia Pour, MD [facility] 03/22/2022

## 2022-03-21 NOTE — Progress Notes (Signed)
[place] for warfarin Indication: atrial fibrillation  Allergies  Allergen Reactions   Petrolatum-Zinc Oxide Anaphylaxis, Rash and Swelling   Sulfa Antibiotics Rash   Sulfonamide Derivatives Anaphylaxis and Swelling   Tetanus Toxoid     Other reaction(s): Other (See Comments) OTHER REACTION   Tetanus Toxoids Swelling   Other Rash and Other (See Comments)    STERI - STRIPS  It can affect proteins in her kidneys so she doesn't take REACTION: proteinuria Other reaction(s): Other (See Comments), Unknown It can affect proteins in her kidneys so she doesn't take   Nsaids Other (See Comments)    REACTION: proteinuria Other reaction(s): Other (See Comments), Unknown It can affect proteins in her kidneys so she doesn't take    Tetanus Toxoid, Adsorbed Other (See Comments)    Other reaction(s): Other (See Comments) Turned arm red Turned arm red    Patient Measurements: Height: 5' 2.99" (160 cm) Weight: 71.2 kg (156 lb 14.4 oz) IBW/kg (Calculated) : 52.38  Vital Signs: Temp: 98 F (36.7 C) (08/18 0355) Temp Source: Oral (08/18 0355) BP: 135/60 (08/18 0355) Pulse Rate: 69 (08/18 0355)  Labs: Recent Labs    03/18/22 1555 03/18/22 1601 03/18/22 1819 03/19/22 0042 03/20/22 0434 03/21/22 0415  HGB 11.5*   < >  --  10.1* 10.0* 9.9*  HCT 36.2   < >  --  31.1* 30.6* 30.8*  PLT 131*  --   --  102* 84* 84*  APTT 38*  --   --   --   --   --   LABPROT 26.9*  --   --  28.8* 28.1* 26.6*  INR 2.5*  --   --  2.8* 2.7* 2.5*  CREATININE 2.48*   < >  --  2.13* 2.12* 1.96*  TROPONINIHS 15  --  13  --   --   --    < > = values in this interval not displayed.     Estimated Creatinine Clearance: 22.4 mL/min (A) (by C-G formula based on SCr of 1.96 mg/dL (H)).   Medical History: Past Medical History:  Diagnosis Date   Allergic rhinitis    Asthma    NOS w/ acute exacerbation   Blood transfusion without reported diagnosis    CKD (chronic  kidney disease) stage 3, GFR 30-59 ml/min (HCC) 09/12/2018   Colon polyps    TUBULAR ADENOMAS AND HYPERPLASTIC   Complete heart block (HCC)    requiring PPM (MDT) post surgical myomectomy at [facility],  leads are epicardial with abdominal implant, high ventricular threshold at implant   COPD (chronic obstructive pulmonary disease) (HCC)    Depressive disorder    Diastolic dysfunction    DM (diabetes mellitus) ([place])    Gallstones    GERD (gastroesophageal reflux disease)    Gout 08/20/2012   Heart murmur    Hyperpotassemia    Hypersomnia    Hypertension    Hypertrophic cardiomyopathy ([place])    s/p surgical myomectomy at [facility] 76/72 complicated by septal VSD post procedure requiring reoperation with patch closure and tricuspid valve replacement   Kidney stones    Myocardial infarction ([place]) 2011   Obstructive sleep apnea    persistent daytime sleepiness despite cpap   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   Psoriasis    Pyuria    Renal insufficiency    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   INR today therapeutic at 2.5 - on PTA dosing of warfarin. Hgb stable at 9.9, plt drifting down to 84 (will monitor). No documented s/sx of bleeding.   PTA dosing: 2.'5mg'$  daily, except 1.'5mg'$  Wed  Goal of Therapy:  INR 2-3 Monitor platelets by anticoagulation protocol: Yes   Plan:  Warfarin 2.5 mg PO - c/w PTA dosing Daily INR, s/s bleeding  [place] Crane, Roylene Reason, [facility] Clinical Pharmacist  03/21/2022 8:02 AM   [facility] pharmacy phone numbers are listed on [email or web address]

## 2022-03-21 NOTE — Progress Notes (Signed)
Mobility Specialist Progress Note    03/21/22 1047  Mobility  Activity Ambulated with assistance in hallway  Level of Assistance Contact guard assist, steadying assist  Assistive Device Four wheel walker  Distance Ambulated (ft) 200 ft  Activity Response Tolerated well  $Mobility charge 1 Mobility   Pt received in bed and agreeable. No complaints on walk. Returned to bed with call bell in reach.    Hildred Alamin Mobility Specialist

## 2022-03-21 NOTE — Progress Notes (Signed)
  Transition of Care [facility]) Screening Note   Patient Details  Name: Holly Simmons Date of Birth: 09-05-1940   Transition of Care [facility]) CM/SW Contact:    Bethann Berkshire, Jones Phone Number: 03/21/2022, 10:27 AM    Transition of Care Department [facility]) has reviewed patient and no TOC needs have been identified at this time. We will continue to monitor patient advancement through interdisciplinary progression rounds. If new patient transition needs arise, please place a TOC consult.

## 2022-03-24 ENCOUNTER — Ambulatory Visit: Payer: Medicare PPO | Admitting: Internal Medicine

## 2022-03-24 ENCOUNTER — Telehealth: Payer: Self-pay

## 2022-03-24 VITALS — BP 144/78 | HR 69 | Temp 98.6°F | Ht 62.0 in | Wt 153.0 lb

## 2022-03-24 DIAGNOSIS — N1832 Chronic kidney disease, stage 3b: Secondary | ICD-10-CM | POA: Diagnosis not present

## 2022-03-24 DIAGNOSIS — I5032 Chronic diastolic (congestive) heart failure: Secondary | ICD-10-CM

## 2022-03-24 DIAGNOSIS — R059 Cough, unspecified: Secondary | ICD-10-CM

## 2022-03-24 DIAGNOSIS — N179 Acute kidney failure, unspecified: Secondary | ICD-10-CM

## 2022-03-24 DIAGNOSIS — N189 Chronic kidney disease, unspecified: Secondary | ICD-10-CM | POA: Diagnosis not present

## 2022-03-24 DIAGNOSIS — F322 Major depressive disorder, single episode, severe without psychotic features: Secondary | ICD-10-CM | POA: Diagnosis not present

## 2022-03-24 DIAGNOSIS — D696 Thrombocytopenia, unspecified: Secondary | ICD-10-CM

## 2022-03-24 NOTE — Progress Notes (Unsigned)
Patient ID: Holly Simmons, female   DOB: [birth year], 81 y.o.   MRN: [id number]        Chief Complaint: follow up recent hospn with me today as PCP not available       HPI:  Holly Simmons is a 81 y.o. female here with son, c/o recent hospn aug 71- 54 with aphasia, generalized weakness, near syncope in the setting of decreased (subacute/chronically) oral intake. Found to have AKI, in the setting of ongoing depression and reduced po intake for 1 wk, and not having started the abilify per PCP as son needed further reassurance of safety of starting this.  Troponin normal x2, ECG paced. CT head showed no acute abnormalities, CTA head and neck showed chronic ischemic changes, mild nonobstructive atherosclerosis. Neurology was consulted, IV fluids started and patient admitted with concern for prerenal azotemia causing acute encephalopathy.  With IV fluids and PT/OT the patient's mental and physical function have returned near her baseline. Renal function improved , but still has concerns regarding depression and situation occurring again.  No other med changes, and advised f/u cbc, bmp, as well as Renal and Cardiology.  Pt again is somewhat tearful on discussion, but after this son and pt more willing to start the ability in addition to other psychotropic tx.         Transitional Care Management elements noted today: 1)  Date of D/C: as above 2)  Medication reconciliation:  done today at end visit 3)  Review of D/C summary or other information:  done today 4)  Review of need for f/u on pending diagnostic tests and treatments:  done today - none 5)  Review of need for Interaction with other providers who will assume or resume care of pt specific problems: done today, has f/u appts 6)  Education of patient/family/guardian or caregiver: done today with son  Wt Readings from Last 3 Encounters:  03/24/22 153 lb (69.4 kg)  03/21/22 156 lb 14.4 oz (71.2 kg)  02/26/22 151 lb (68.5 kg)   BP Readings  from Last 3 Encounters:  03/24/22 (!) 144/78  03/21/22 135/60  02/26/22 126/72         Past Medical History:  Diagnosis Date   Allergic rhinitis    Asthma    NOS w/ acute exacerbation   Blood transfusion without reported diagnosis    CKD (chronic kidney disease) stage 3, GFR 30-59 ml/min (HCC) 09/12/2018   Colon polyps    TUBULAR ADENOMAS AND HYPERPLASTIC   Complete heart block ([place])    requiring PPM (MDT) post surgical myomectomy at [facility],  leads are epicardial with abdominal implant, high ventricular threshold at implant   COPD (chronic obstructive pulmonary disease) ([place])    Depressive disorder    Diastolic dysfunction    DM (diabetes mellitus) ([place])    Gallstones    GERD (gastroesophageal reflux disease)    Gout 08/20/2012   Heart murmur    Hyperpotassemia    Hypersomnia    Hypertension    Hypertrophic cardiomyopathy ([place])    s/p surgical myomectomy at [facility] 23/76 complicated by septal VSD post procedure requiring reoperation with patch closure and tricuspid valve replacement   Kidney stones    Myocardial infarction ([place]) 2011   Obstructive sleep apnea    persistent daytime sleepiness despite cpap   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   Psoriasis    Pyuria    Renal insufficiency    Tricuspid valve replaced    MDT 56m Mosaic Valve   Typical atrial  flutter ([place]) 9/15   Past Surgical History:  Procedure Laterality Date   ABDOMINAL HYSTERECTOMY     APPENDECTOMY     BREAST CYST EXCISION     CARDIOVERSION N/A 08/01/2014   Procedure: CARDIOVERSION;  Surgeon: Thayer Headings, MD;  Location: [place];  Service: Cardiovascular;  Laterality: N/A;   CESAREAN SECTION     CHOLECYSTECTOMY     COLONOSCOPY     EYE SURGERY  [id number]   left eye lens implant   EYE SURGERY  [id number]   right eye lens implant   INSERT / REPLACE / REMOVE PACEMAKER  07/13/2012   PACEMAKER INSERTION  07/02/11   epicardial wires with abdominal implant at [facility] 12/12,  high ventricular lead threshold at implant per  Dr Westley Gambles   PERMANENT PACEMAKER INSERTION N/A 07/13/2012   Procedure: PERMANENT PACEMAKER INSERTION;  Surgeon: Thompson Grayer, MD;  Location: [facility] CATH LAB;  Service: Cardiovascular;  Laterality: N/A;   POLYPECTOMY     septal myomectomy for hypertrophic CM  06/23/11   by Dr Evelina Dun at [facility], complicated by septal VSD requiring patch repair and tricuspid valve replacement   TONSILLECTOMY     TRICUSPID VALVE REPLACEMENT  11/12   Medtronic 47m Mosaic tissue valve   VSD REPAIR  06/2011    reports that she has never smoked. She has never used smokeless tobacco. She reports that she does not drink alcohol and does not use drugs. family history includes Bladder Cancer in her father; Breast cancer in her mother and paternal aunt; Cancer in her father and mother; Dementia in her maternal aunt, maternal uncle, and mother; Heart attack in her father; Heart disease in her maternal grandfather and paternal grandfather; Hypertension in her daughter and mother; Ovarian cancer in her maternal aunt; Pancreatic cancer in her cousin; Prostate cancer in her father; Varicose Veins in her father. Allergies  Allergen Reactions   Petrolatum-Zinc Oxide Anaphylaxis, Rash and Swelling   Sulfa Antibiotics Rash   Sulfonamide Derivatives Anaphylaxis and Swelling   Tetanus Toxoid     Other reaction(s): Other (See Comments) OTHER REACTION   Tetanus Toxoids Swelling   Other Rash and Other (See Comments)    STERI - STRIPS  It can affect proteins in her kidneys so she doesn't take REACTION: proteinuria Other reaction(s): Other (See Comments), Unknown It can affect proteins in her kidneys so she doesn't take   Nsaids Other (See Comments)    REACTION: proteinuria Other reaction(s): Other (See Comments), Unknown It can affect proteins in her kidneys so she doesn't take    Tetanus Toxoid, Adsorbed Other (See Comments)    Other reaction(s): Other (See Comments) Turned arm red Turned arm red   Current Outpatient  Medications on File Prior to Visit  Medication Sig Dispense Refill   allopurinol (ZYLOPRIM) 100 MG tablet Take 1 tablet (100 mg total) by mouth daily. 90 tablet 1   amoxicillin (AMOXIL) 500 MG capsule TAKE 4 CAPSULES 1 HOUR PRIOR TO PROCEDURE 30 capsule 0   ARIPiprazole (ABILIFY) 2 MG tablet Take 1 tablet (2 mg total) by mouth daily. 90 tablet 0   Betamethasone Valerate 0.12 % foam APPLY TO SCALP AS DIRECTED. (Patient taking differently: 1 application  once a week.) 100 g 2   buPROPion (WELLBUTRIN XL) 300 MG 24 hr tablet TAKE ONE TABLET BY MOUTH ONCE DAILY 90 tablet 0   dronabinol (MARINOL) 5 MG capsule Take 1 capsule (5 mg total) by mouth 2 (two) times daily before lunch and supper. 180 capsule 1  famotidine (PEPCID) 40 MG tablet Take 1 tablet (40 mg total) by mouth daily. 90 tablet 1   ferrous sulfate 325 (65 FE) MG tablet Take 1 tablet (325 mg total) by mouth 2 (two) times daily with a meal. (Patient taking differently: Take 325-650 mg by mouth daily with breakfast.) 180 tablet 1   ipratropium (ATROVENT) 0.03 % nasal spray USE 2 SPRAYS IN EACH NOSTRIL 3 TIMES A DAY AS NEEDED. (Patient taking differently: Place 2 sprays into both nostrils daily as needed (nose bleed).) 30 mL 0   lamoTRIgine (LAMICTAL) 25 MG tablet TAKE ONE TABLET BY MOUTH ONCE DAILY 90 tablet 0   metoprolol tartrate (LOPRESSOR) 25 MG tablet Take 1 tablet (25 mg total) by mouth 2 (two) times daily. 180 tablet 0   modafinil (PROVIGIL) 100 MG tablet Take 1 tablet (100 mg total) by mouth daily. (Patient taking differently: Take 100 mg by mouth daily as needed (sleep disorder).) 30 tablet 5   Pitavastatin Calcium 2 MG TABS Take 1 tablet (2 mg total) by mouth daily. 90 tablet 1   PRESCRIPTION MEDICATION 1 Dose by Other route at bedtime as needed (sleep). CPAP     Propylene Glycol (SYSTANE BALANCE OP) Place 1 drop into both eyes 4 (four) times daily as needed (dry eyes).     SYMBICORT 80-4.5 MCG/ACT inhaler USE 2 PUFFS TWICE A DAY.  (Patient taking differently: 2 puffs 2 (two) times daily.) 30.6 g 5   torsemide (DEMADEX) 20 MG tablet Take 1 tablet (20 mg total) by mouth daily as needed (for 2 lb weight gain overnight or 5 lbs in a week). 90 tablet 0   Vilazodone HCl (VIIBRYD) 40 MG TABS Take 1 tablet (40 mg total) by mouth daily. 90 tablet 1   warfarin (COUMADIN) 1 MG tablet Take 1 tablet (1 mg total) by mouth daily. TAKE 1 1/2 TABLET EVERY WEDNESDAY OR AS DIRECTED BY ANTICOAGULATION LCINIC 20 tablet 0   warfarin (COUMADIN) 2.5 MG tablet TAKE 1 TABLET BY MOUTH DAILY EXCEPT NONE ON WEDNESDAYS OR AS DIRECTED BY ANTICOAGULATION CLINIC (Patient taking differently: Take 2.5 mg by mouth daily. TAKE 1 TABLET BY MOUTH DAILY EXCEPT ON WEDNESDAYS) 40 tablet 1   No current facility-administered medications on file prior to visit.        ROS:  All others reviewed and negative.  Objective        PE:  BP (!) 144/78 (BP Location: Left Arm, Patient Position: Sitting, Cuff Size: Large)   Pulse 69   Temp 98.6 F (37 C) (Oral)   Ht '5\' 2"'$  (1.575 m)   Wt 153 lb (69.4 kg)   SpO2 92%   BMI 27.98 kg/m                 Constitutional: Pt appears in NAD               HENT: Head: NCAT.                Right Ear: External ear normal.                 Left Ear: External ear normal.                Eyes: . Pupils are equal, round, and reactive to light. Conjunctivae and EOM are normal               Nose: without d/c or deformity  Neck: Neck supple. Gross normal ROM               Cardiovascular: Normal rate and regular rhythm.                 Pulmonary/Chest: Effort normal and breath sounds without rales or wheezing.                Abd:  Soft, NT, ND, + BS, no organomegaly               Neurological: Pt is alert. At baseline orientation, motor grossly intact               Skin: Skin is warm. No rashes, no other new lesions, LE edema - none               Psychiatric: Pt behavior is normal without agitation , but depressed affect and  tearful at times  Micro: none  Cardiac tracings I have personally interpreted today:  none  Pertinent Radiological findings (summarize): none   Lab Results  Component Value Date   WBC 3.5 (L) 03/21/2022   HGB 9.9 (L) 03/21/2022   HCT 30.8 (L) 03/21/2022   PLT 84 (L) 03/21/2022   GLUCOSE 85 03/21/2022   CHOL 129 03/19/2022   TRIG 40 03/19/2022   HDL 47 03/19/2022   LDLCALC 74 03/19/2022   ALT 12 03/18/2022   AST 16 03/18/2022   NA 139 03/21/2022   K 4.0 03/21/2022   CL 106 03/21/2022   CREATININE 1.96 (H) 03/21/2022   BUN 26 (H) 03/21/2022   CO2 27 03/21/2022   TSH 2.61 02/26/2022   INR 2.5 (H) 03/21/2022   HGBA1C 5.3 03/18/2022   MICROALBUR 63.9 (H) 04/20/2018   Assessment/Plan:  Holly Simmons is a 81 y.o. White or Caucasian [1] female with  has a past medical history of Allergic rhinitis, Asthma, Blood transfusion without reported diagnosis, CKD (chronic kidney disease) stage 3, GFR 30-59 ml/min ([place]) (09/12/2018), Colon polyps, Complete heart block ([place]), COPD (chronic obstructive pulmonary disease) ([place]), Depressive disorder, Diastolic dysfunction, DM (diabetes mellitus) ([place]), Gallstones, GERD (gastroesophageal reflux disease), Gout (08/20/2012), Heart murmur, Hyperpotassemia, Hypersomnia, Hypertension, Hypertrophic cardiomyopathy ([place]), Kidney stones, Myocardial infarction ([place]) (2011), Obstructive sleep apnea, Pacemaker (07/13/2012), Psoriasis, Pyuria, Renal insufficiency, Tricuspid valve replaced, and Typical atrial flutter ([place]) (9/15).  Major depression D/w son and pt, now to continue viibryd, lamictal and provigil, and add abilify 2 mg lower dose; declines referral for counseling but should consider psychiatric referral if not improving  Chronic kidney disease, stage 3b ([place]) With recent AKI on CKD- for f/u bmp  Thrombocytopenia ([place]) Also for f/u cbc as recommended  Chronic diastolic heart failure (HCC) No evidence for volume overload today,  to f/u any  worsening symptoms or concerns  Followup: Return if symptoms worsen or fail to improve.  Cathlean Cower, MD 03/26/2022 4:44 AM [facility]

## 2022-03-24 NOTE — Patient Instructions (Signed)
Ok to take the Abilify 2 mg for depression and appetite  Please continue all other medications as before, and refills have been done if requested.  Please have the pharmacy call with any other refills you may need.  Please keep your appointments with your specialists as you may have planned  Please go to the XRAY Department in the first floor for the x-ray testing - at the [place] [facility] building  Please go to the LAB at the blood drawing area for the tests to be done - at the [facility] at your convenience

## 2022-03-24 NOTE — Telephone Encounter (Signed)
Transition Care Management Follow-up Telephone Call Date of discharge and from where: [facility] 03-21-22 Dx:AKI How have you been since you were released from the hospital? Extremely weak but doing better Any questions or concerns? No  Items Reviewed: Did the pt receive and understand the discharge instructions provided? Yes  Medications obtained and verified? Yes  Other? No  Any new allergies since your discharge? No  Dietary orders reviewed? Yes Do you have support at home? Yes   Home Care and Equipment/Supplies: Were home health services ordered? no If so, what is the name of the agency? na  Has the agency set up a time to come to the patient's home? not applicable Were any new equipment or medical supplies ordered?  No What is the name of the medical supply agency? na Were you able to get the supplies/equipment? not applicable Do you have any questions related to the use of the equipment or supplies? No  Functional Questionnaire: (I = Independent and D = Dependent) ADLs: I  Bathing/Dressing- I-with assistance   Meal Prep- I  Eating- I  Maintaining continence- I  Transferring/Ambulation- I-rollator  Managing Meds- D-son fills pill planner   Follow up appointments reviewed:  PCP Hospital f/u appt confirmed? Yes  Scheduled to see Dr Jenny Reichmann on 03-24-22 @ 340pm. [facility] f/u appt confirmed? No  . Are transportation arrangements needed? No  If their condition worsens, is the pt aware to call PCP or go to the Emergency Dept.? Yes Was the patient provided with contact information for the PCP's office or ED? Yes Was to pt encouraged to call back with questions or concerns? Yes

## 2022-03-26 ENCOUNTER — Encounter: Payer: Self-pay | Admitting: Internal Medicine

## 2022-03-26 DIAGNOSIS — F329 Major depressive disorder, single episode, unspecified: Secondary | ICD-10-CM | POA: Insufficient documentation

## 2022-03-26 NOTE — Assessment & Plan Note (Signed)
No evidence for volume overload today,  to f/u any worsening symptoms or concerns

## 2022-03-26 NOTE — Assessment & Plan Note (Signed)
Also for f/u cbc as recommended

## 2022-03-26 NOTE — Assessment & Plan Note (Signed)
D/w son and pt, now to continue viibryd, lamictal and provigil, and add abilify 2 mg lower dose; declines referral for counseling but should consider psychiatric referral if not improving

## 2022-03-26 NOTE — Assessment & Plan Note (Signed)
With recent AKI on CKD- for f/u bmp

## 2022-03-28 ENCOUNTER — Encounter: Payer: Self-pay | Admitting: Internal Medicine

## 2022-03-28 ENCOUNTER — Other Ambulatory Visit (INDEPENDENT_AMBULATORY_CARE_PROVIDER_SITE_OTHER): Payer: Medicare PPO

## 2022-03-28 DIAGNOSIS — N179 Acute kidney failure, unspecified: Secondary | ICD-10-CM

## 2022-03-28 DIAGNOSIS — N189 Chronic kidney disease, unspecified: Secondary | ICD-10-CM | POA: Diagnosis not present

## 2022-03-28 LAB — CBC WITH DIFFERENTIAL/PLATELET
Basophils Absolute: 0 10*3/uL (ref 0.0–0.1)
Basophils Relative: 0.5 % (ref 0.0–3.0)
Eosinophils Absolute: 0.2 10*3/uL (ref 0.0–0.7)
Eosinophils Relative: 4.4 % (ref 0.0–5.0)
HCT: 33.3 % — ABNORMAL LOW (ref 36.0–46.0)
Hemoglobin: 10.7 g/dL — ABNORMAL LOW (ref 12.0–15.0)
Lymphocytes Relative: 18.5 % (ref 12.0–46.0)
Lymphs Abs: 0.9 10*3/uL (ref 0.7–4.0)
MCHC: 32.1 g/dL (ref 30.0–36.0)
MCV: 98 fl (ref 78.0–100.0)
Monocytes Absolute: 0.5 10*3/uL (ref 0.1–1.0)
Monocytes Relative: 10.3 % (ref 3.0–12.0)
Neutro Abs: 3.2 10*3/uL (ref 1.4–7.7)
Neutrophils Relative %: 66.3 % (ref 43.0–77.0)
Platelets: 132 10*3/uL — ABNORMAL LOW (ref 150.0–400.0)
RBC: 3.4 Mil/uL — ABNORMAL LOW (ref 3.87–5.11)
RDW: 14.9 % (ref 11.5–15.5)
WBC: 4.8 10*3/uL (ref 4.0–10.5)

## 2022-03-28 LAB — BASIC METABOLIC PANEL
BUN: 20 mg/dL (ref 6–23)
CO2: 30 mEq/L (ref 19–32)
Calcium: 10.3 mg/dL (ref 8.4–10.5)
Chloride: 105 mEq/L (ref 96–112)
Creatinine, Ser: 1.53 mg/dL — ABNORMAL HIGH (ref 0.40–1.20)
GFR: 32.35 mL/min — ABNORMAL LOW (ref >60.00)
Glucose, Bld: 126 mg/dL — ABNORMAL HIGH (ref 70–99)
Potassium: 4 mEq/L (ref 3.5–5.1)
Sodium: 139 mEq/L (ref 135–145)

## 2022-04-01 ENCOUNTER — Telehealth: Payer: Self-pay

## 2022-04-01 ENCOUNTER — Ambulatory Visit (INDEPENDENT_AMBULATORY_CARE_PROVIDER_SITE_OTHER): Payer: Medicare PPO

## 2022-04-01 DIAGNOSIS — Z7901 Long term (current) use of anticoagulants: Secondary | ICD-10-CM | POA: Diagnosis not present

## 2022-04-01 LAB — POCT INR: INR: 2.1 (ref 2.0–3.0)

## 2022-04-01 NOTE — Patient Instructions (Addendum)
Pre visit review using our clinic review tool, if applicable. No additional management support is needed unless otherwise documented below in the visit note.  Continue 1 tablet of the 2.5 mg tablet daily except take 1 1/2 tablet of the 1 mg tablet on Wednesdays.  Re-check in 6 weeks.

## 2022-04-01 NOTE — Telephone Encounter (Signed)
Pt in today for coumadin clinic and her son, Holly Simmons, reported pt saw Dr. Jenny Reichmann for hospital f/u recently and he mentioned it is time for her Prolia injection and that she may be able to get it at coumadin clinic apt.  Advised pt and Holly Simmons that the office will need to make sure they have completed a PA and labs if necessary. Last Prolia observed in chart was on 07/18/21 at coumadin clinic apt. Advised a msg would be sent to office to f/u concerning the Prolia and if they did not hear anything by the middle of next week to contact the office. Holly Simmons verbalized understanding.

## 2022-04-01 NOTE — Progress Notes (Signed)
Continue 1 tablet of the 2.5 mg tablet daily except take 1 1/2 tablet of the 1 mg tablet on Wednesdays.  Re-check in 6 weeks.

## 2022-04-03 NOTE — Telephone Encounter (Signed)
Pt has been scheduled for tomorrow 9/1 @ 9.20am for Prolia.

## 2022-04-04 ENCOUNTER — Ambulatory Visit (INDEPENDENT_AMBULATORY_CARE_PROVIDER_SITE_OTHER): Payer: Medicare PPO

## 2022-04-04 DIAGNOSIS — M810 Age-related osteoporosis without current pathological fracture: Secondary | ICD-10-CM | POA: Diagnosis not present

## 2022-04-04 MED ORDER — DENOSUMAB 60 MG/ML ~~LOC~~ SOSY
60.0000 mg | PREFILLED_SYRINGE | Freq: Once | SUBCUTANEOUS | Status: AC
  Administered 2022-04-04: 60 mg via SUBCUTANEOUS

## 2022-04-04 NOTE — Progress Notes (Signed)
Prolia given. Pt tolerated well. Pt also informed to call the office for any reactions.

## 2022-04-08 ENCOUNTER — Ambulatory Visit (INDEPENDENT_AMBULATORY_CARE_PROVIDER_SITE_OTHER): Payer: Medicare PPO

## 2022-04-08 DIAGNOSIS — I442 Atrioventricular block, complete: Secondary | ICD-10-CM | POA: Diagnosis not present

## 2022-04-08 LAB — CUP PACEART REMOTE DEVICE CHECK
Battery Impedance: 2006 Ohm
Battery Remaining Longevity: 32 mo
Battery Voltage: 2.74 V
Brady Statistic AP VP Percent: 71 %
Brady Statistic AP VS Percent: 0 %
Brady Statistic AS VP Percent: 29 %
Brady Statistic AS VS Percent: 0 %
Date Time Interrogation Session: 20230905092239
Implantable Lead Implant Date: 20131210
Implantable Lead Implant Date: 20131210
Implantable Lead Location: 753858
Implantable Lead Location: 753859
Implantable Lead Model: 4296
Implantable Lead Model: 5076
Implantable Pulse Generator Implant Date: 20131210
Lead Channel Impedance Value: 1096 Ohm
Lead Channel Impedance Value: 436 Ohm
Lead Channel Pacing Threshold Amplitude: 0.875 V
Lead Channel Pacing Threshold Amplitude: 1.25 V
Lead Channel Pacing Threshold Pulse Width: 0.4 ms
Lead Channel Pacing Threshold Pulse Width: 0.4 ms
Lead Channel Setting Pacing Amplitude: 2 V
Lead Channel Setting Pacing Amplitude: 3.25 V
Lead Channel Setting Pacing Pulse Width: 0.4 ms
Lead Channel Setting Sensing Sensitivity: 8 mV

## 2022-04-09 ENCOUNTER — Other Ambulatory Visit: Payer: Self-pay | Admitting: Internal Medicine

## 2022-04-09 DIAGNOSIS — I5032 Chronic diastolic (congestive) heart failure: Secondary | ICD-10-CM

## 2022-04-09 DIAGNOSIS — Z7901 Long term (current) use of anticoagulants: Secondary | ICD-10-CM

## 2022-04-09 DIAGNOSIS — R600 Localized edema: Secondary | ICD-10-CM

## 2022-04-09 DIAGNOSIS — Z1231 Encounter for screening mammogram for malignant neoplasm of breast: Secondary | ICD-10-CM

## 2022-04-09 NOTE — Telephone Encounter (Signed)
Last Prolia inj 04/04/22 Next Prolia inj due 10/04/22

## 2022-04-13 ENCOUNTER — Encounter: Payer: Self-pay | Admitting: Cardiovascular Disease

## 2022-04-14 DIAGNOSIS — N2581 Secondary hyperparathyroidism of renal origin: Secondary | ICD-10-CM | POA: Diagnosis not present

## 2022-04-14 DIAGNOSIS — N189 Chronic kidney disease, unspecified: Secondary | ICD-10-CM | POA: Diagnosis not present

## 2022-04-14 DIAGNOSIS — N184 Chronic kidney disease, stage 4 (severe): Secondary | ICD-10-CM | POA: Diagnosis not present

## 2022-04-22 DIAGNOSIS — N2581 Secondary hyperparathyroidism of renal origin: Secondary | ICD-10-CM | POA: Diagnosis not present

## 2022-04-22 DIAGNOSIS — N184 Chronic kidney disease, stage 4 (severe): Secondary | ICD-10-CM | POA: Diagnosis not present

## 2022-04-22 DIAGNOSIS — D631 Anemia in chronic kidney disease: Secondary | ICD-10-CM | POA: Diagnosis not present

## 2022-04-22 DIAGNOSIS — I5030 Unspecified diastolic (congestive) heart failure: Secondary | ICD-10-CM | POA: Diagnosis not present

## 2022-04-22 DIAGNOSIS — I4891 Unspecified atrial fibrillation: Secondary | ICD-10-CM | POA: Diagnosis not present

## 2022-04-22 DIAGNOSIS — I129 Hypertensive chronic kidney disease with stage 1 through stage 4 chronic kidney disease, or unspecified chronic kidney disease: Secondary | ICD-10-CM | POA: Diagnosis not present

## 2022-05-02 NOTE — Progress Notes (Signed)
Remote pacemaker transmission.   

## 2022-05-11 ENCOUNTER — Other Ambulatory Visit: Payer: Self-pay | Admitting: Internal Medicine

## 2022-05-11 DIAGNOSIS — F418 Other specified anxiety disorders: Secondary | ICD-10-CM

## 2022-05-11 DIAGNOSIS — F332 Major depressive disorder, recurrent severe without psychotic features: Secondary | ICD-10-CM

## 2022-05-11 DIAGNOSIS — F3341 Major depressive disorder, recurrent, in partial remission: Secondary | ICD-10-CM

## 2022-05-13 ENCOUNTER — Ambulatory Visit (INDEPENDENT_AMBULATORY_CARE_PROVIDER_SITE_OTHER): Payer: Medicare PPO

## 2022-05-13 DIAGNOSIS — Z7901 Long term (current) use of anticoagulants: Secondary | ICD-10-CM | POA: Diagnosis not present

## 2022-05-13 LAB — POCT INR: INR: 1.8 — AB (ref 2.0–3.0)

## 2022-05-13 NOTE — Progress Notes (Signed)
Pt and son [name]) report that pt takes one 2.5 mg tablet every day except for Wednesdays, when she takes 1/2 tablet (1.25 mg total). Calendar updated to reflect this dosing regimen.   Pt and son instructed to increase dose today to 1 1/2 tablets (3.75 mg total) then continue to take one tablet daily except take 1/2 tablet on Wednesdays.  Recheck in 3 weeks. Both verbalize understanding.

## 2022-05-13 NOTE — Patient Instructions (Addendum)
Increase dose today to 1 1/2 tablets (3.75 mg total) then continue to take one tablet daily except take 1/2 tablet on Wednesdays.  Recheck in 3 weeks.

## 2022-05-16 ENCOUNTER — Ambulatory Visit (INDEPENDENT_AMBULATORY_CARE_PROVIDER_SITE_OTHER): Payer: Medicare PPO

## 2022-05-16 ENCOUNTER — Encounter: Payer: Self-pay | Admitting: Internal Medicine

## 2022-05-16 DIAGNOSIS — Z7901 Long term (current) use of anticoagulants: Secondary | ICD-10-CM

## 2022-05-16 DIAGNOSIS — E119 Type 2 diabetes mellitus without complications: Secondary | ICD-10-CM | POA: Diagnosis not present

## 2022-05-16 DIAGNOSIS — H04123 Dry eye syndrome of bilateral lacrimal glands: Secondary | ICD-10-CM | POA: Diagnosis not present

## 2022-05-16 DIAGNOSIS — H524 Presbyopia: Secondary | ICD-10-CM | POA: Diagnosis not present

## 2022-05-16 LAB — POCT INR: INR: 1.7 — AB (ref 2.0–3.0)

## 2022-05-16 NOTE — Patient Instructions (Signed)
Increase dose today to 1 1/2 tablets (3.75 mg total) then change weekly dose to one tablet daily. Will recheck on 10/3. Both verbalize understanding.

## 2022-05-16 NOTE — Progress Notes (Signed)
INR today is 1.7.  Pt states the only time she noticed blood in her urine was this morning x 1. She reports that her urine has been clear without any obvious blood ever since. Denies any other s/s of bleeding.   Pt's son [name]) reports that pt's appetite has noticeably increased over the past two months. Pt has been eating more.    Pt and son instructed to increase dose today to 1 1/2 tablets (3.75 mg total) then change weekly dose to one tablet daily. Will recheck on 10/3. Both verbalize understanding.

## 2022-05-16 NOTE — Telephone Encounter (Signed)
Called and spoke to pt's son. He and pt are coming in this afternoon for INR check with [facility].

## 2022-06-03 ENCOUNTER — Ambulatory Visit (INDEPENDENT_AMBULATORY_CARE_PROVIDER_SITE_OTHER): Payer: Medicare PPO

## 2022-06-03 DIAGNOSIS — Z7901 Long term (current) use of anticoagulants: Secondary | ICD-10-CM

## 2022-06-03 LAB — POCT INR: INR: 2.2 (ref 2.0–3.0)

## 2022-06-03 NOTE — Progress Notes (Signed)
Continue to take one tablet daily. Recheck in 6 weeks.

## 2022-06-03 NOTE — Patient Instructions (Signed)
Continue to take one tablet daily. Recheck in 6 weeks.

## 2022-06-06 ENCOUNTER — Other Ambulatory Visit: Payer: Self-pay

## 2022-06-06 ENCOUNTER — Emergency Department (HOSPITAL_BASED_OUTPATIENT_CLINIC_OR_DEPARTMENT_OTHER)
Admission: EM | Admit: 2022-06-06 | Discharge: 2022-06-06 | Disposition: A | Discharge status: Home / Self Care | Payer: Medicare PPO | Attending: Emergency Medicine | Admitting: Emergency Medicine

## 2022-06-06 ENCOUNTER — Telehealth: Payer: Self-pay

## 2022-06-06 ENCOUNTER — Encounter (HOSPITAL_BASED_OUTPATIENT_CLINIC_OR_DEPARTMENT_OTHER): Payer: Self-pay | Admitting: Emergency Medicine

## 2022-06-06 ENCOUNTER — Ambulatory Visit (INDEPENDENT_AMBULATORY_CARE_PROVIDER_SITE_OTHER): Payer: Medicare PPO

## 2022-06-06 DIAGNOSIS — K0381 Cracked tooth: Secondary | ICD-10-CM | POA: Insufficient documentation

## 2022-06-06 DIAGNOSIS — Z7901 Long term (current) use of anticoagulants: Secondary | ICD-10-CM | POA: Diagnosis not present

## 2022-06-06 DIAGNOSIS — Z950 Presence of cardiac pacemaker: Secondary | ICD-10-CM | POA: Diagnosis not present

## 2022-06-06 DIAGNOSIS — D696 Thrombocytopenia, unspecified: Secondary | ICD-10-CM | POA: Insufficient documentation

## 2022-06-06 DIAGNOSIS — K068 Other specified disorders of gingiva and edentulous alveolar ridge: Secondary | ICD-10-CM | POA: Diagnosis not present

## 2022-06-06 DIAGNOSIS — K0889 Other specified disorders of teeth and supporting structures: Secondary | ICD-10-CM | POA: Diagnosis present

## 2022-06-06 LAB — CBC
HCT: 38.3 % (ref 36.0–46.0)
Hemoglobin: 12 g/dL (ref 12.0–15.0)
MCH: 30.4 pg (ref 26.0–34.0)
MCHC: 31.3 g/dL (ref 30.0–36.0)
MCV: 97 fL (ref 80.0–100.0)
Platelets: 141 10*3/uL — ABNORMAL LOW (ref 150–400)
RBC: 3.95 MIL/uL (ref 3.87–5.11)
RDW: 14.1 % (ref 11.5–15.5)
WBC: 5.5 10*3/uL (ref 4.0–10.5)
nRBC: 0 % (ref 0.0–0.2)

## 2022-06-06 LAB — POCT INR: INR: 2.3 (ref 2.0–3.0)

## 2022-06-06 NOTE — Telephone Encounter (Signed)
Received call from pt's daughter. She states that pt had a tooth extracted on 10/25 and began having bleeding from that site last night. Daughter reports that there has been two episodes of bleeding that took over 45 minutes to stop with pressure and gauze.  There has been no more bleeding over the past 3 hours.   Advised daughter that if there are any more episodes of sustained bleeding to seek immediate care. Daughter states that she would like to know her mother's INR value to help determine next steps.  Appointment scheduled with anticoagulation clinic today at 12:30, will check INR at that time.

## 2022-06-06 NOTE — ED Triage Notes (Signed)
Pt reports intermittent oral bleeding since earlier this morning. Had tooth extraction 9 days ago. No active bleeding at this time. + blood thinners (Coumadin). INR 2.3 today. Denies injury.

## 2022-06-06 NOTE — ED Provider Notes (Signed)
[facility] Provider Note   CSN: [id number] Arrival date & time: 06/06/22  1505     History  Chief Complaint  Patient presents with   Dental Problem    Holly Simmons is a 81 y.o. 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  Patient had tooth extraction in the upper left of the maxilla 9 days ago, no issues for 7 days, presumably irritated at dinner last night and has been bleeding intermittently since then.  The bleeding resolved with pressure, family is mostly concerned about potential loss of blood from surgical procedure and intermittent bleeding.  Her INR was therapeutic at 2.3 today.  HPI     Home Medications Prior to Admission medications   Medication Sig Start Date End Date Taking? Authorizing Provider  allopurinol (ZYLOPRIM) 100 MG tablet Take 1 tablet (100 mg total) by mouth daily. 12/08/21   Janith Lima, MD  amoxicillin (AMOXIL) 500 MG capsule TAKE 4 CAPSULES 1 HOUR PRIOR TO PROCEDURE 01/10/21   Josue Hector, MD  ARIPiprazole (ABILIFY) 2 MG tablet TAKE ONE TABLET BY MOUTH DAILY 05/11/22   Janith Lima, MD  Betamethasone Valerate 0.12 % foam APPLY TO SCALP AS DIRECTED. Patient taking differently: 1 application  once a week. 12/11/20   Janith Lima, MD  buPROPion (WELLBUTRIN XL) 300 MG 24 hr tablet TAKE ONE TABLET BY MOUTH ONCE DAILY 05/11/22   Janith Lima, MD  dronabinol (MARINOL) 5 MG capsule Take 1 capsule (5 mg total) by mouth 2 (two) times daily before lunch and supper. 12/08/21   Janith Lima, MD  famotidine (PEPCID) 40 MG tablet Take 1 tablet (40 mg total) by mouth daily. 12/08/21   Janith Lima, MD  ferrous sulfate 325 (65 FE) MG tablet Take 1 tablet (325 mg total) by mouth 2 (two) times daily with a meal. Patient taking differently: Take 325-650 mg by mouth  daily with breakfast. 11/10/20   Janith Lima, MD  ipratropium (ATROVENT) 0.03 % nasal spray USE 2 SPRAYS IN EACH NOSTRIL 3 TIMES A DAY AS NEEDED. Patient taking differently: Place 2 sprays into both nostrils daily as needed (nose bleed). 04/29/18   [name], MD  lamoTRIgine (LAMICTAL) 25 MG tablet TAKE ONE TABLET BY MOUTH ONCE DAILY 05/11/22   Janith Lima, MD  metoprolol tartrate (LOPRESSOR) 25 MG tablet Take 1 tablet (25 mg total) by mouth 2 (two) times daily. 04/09/22   Janith Lima, MD  modafinil (PROVIGIL) 100 MG tablet Take 1 tablet (100 mg total) by mouth daily. Patient taking differently: Take 100 mg by mouth daily as needed (sleep disorder). 03/29/18   [name], MD  Pitavastatin Calcium 2 MG TABS Take 1 tablet (2 mg total) by mouth daily. 03/03/22   Janith Lima, MD  PRESCRIPTION MEDICATION 1 Dose by Other route at bedtime as needed (sleep). CPAP    [provider]  Propylene Glycol (SYSTANE BALANCE OP) Place 1 drop into both eyes 4 (four) times daily as needed (dry eyes).    [provider]  SYMBICORT 80-4.5 MCG/ACT inhaler USE 2 PUFFS TWICE A DAY. Patient taking differently: 2 puffs 2 (two) times daily. 10/11/20   Janith Lima, MD  torsemide (DEMADEX) 20 MG tablet Take 1 tablet (20 mg total) by mouth daily. 04/09/22   Janith Lima, MD  Vilazodone HCl (  VIIBRYD) 40 MG TABS Take 1 tablet (40 mg total) by mouth daily. 12/08/21   Janith Lima, MD  warfarin (COUMADIN) 1 MG tablet Take 1 tablet (1 mg total) by mouth daily. TAKE 1 1/2 TABLET EVERY WEDNESDAY OR AS DIRECTED BY ANTICOAGULATION LCINIC 02/26/22   Janith Lima, MD  warfarin (COUMADIN) 2.5 MG tablet Take 1 tablet (2.5 mg total) by mouth daily. TAKE 1 TABLET BY MOUTH DAILY EXCEPT ON [facility] 04/09/22   Janith Lima, MD      Allergies    Petrolatum-zinc oxide; Sulfa antibiotics; Sulfonamide derivatives; Tetanus toxoid; Tetanus toxoids; Other; Nsaids; and Tetanus toxoid, adsorbed    Review of  Systems   Review of Systems  HENT:  Positive for dental problem.   All other systems reviewed and are negative.   Physical Exam Updated Vital Signs BP (!) 140/66 (BP Location: Left Arm)   Pulse 66   Temp 97.8 F (36.6 C) (Tympanic)   Resp 17   Ht '5\' 2"'$  (1.575 m)   Wt 70 kg   SpO2 100%   BMI 28.23 kg/m  Physical Exam Vitals and nursing note reviewed.  Constitutional:      General: She is not in acute distress.    Appearance: Normal appearance.  HENT:     Head: Normocephalic and atraumatic.     Mouth/Throat:     Comments: No significant posterior oropharynx erythema, swelling, exudate. Uvula midline, tonsils 1+ bilaterally.  No trismus, stridor, evidence of PTA, floor of mouth swelling or redness.   Patient with several broken teeth in the upper mandible, evidence of postop left upper dentition with appropriately healing gums, at time of my evaluation there is no active bleeding or signs of hematoma or abscess. Eyes:     General:        Right eye: No discharge.        Left eye: No discharge.  Cardiovascular:     Rate and Rhythm: Normal rate and regular rhythm.  Pulmonary:     Effort: Pulmonary effort is normal. No respiratory distress.  Musculoskeletal:        General: No deformity.  Skin:    General: Skin is warm and dry.  Neurological:     Mental Status: She is alert and oriented to person, place, and time.  Psychiatric:        Mood and Affect: Mood normal.        Behavior: Behavior normal.     ED Results / Procedures / Treatments   Labs (all labs ordered are listed, but only abnormal results are displayed) Labs Reviewed  CBC - Abnormal; Notable for the following components:      Result Value   Platelets 141 (*)    All other components within normal limits    EKG None  Radiology No results found.  Procedures Procedures    Medications Ordered in ED Medications - No data to display  ED Course/ Medical Decision Making/ A&P                            Medical Decision Making Amount and/or Complexity of Data Reviewed Labs: ordered.   This is a well-appearing 81 year old female who is chronically anticoagulated on warfarin, she presents with concern for intermittent dental bleeding 9 days status post surgical procedure to remove a tooth.  Her INR was therapeutic today at 2.3.  I independently interpreted her CBC which is notable for very mild thrombocytopenia  with platelets at 141, her hemoglobin was normal, no evidence Of significant acute blood loss. Her physical exam showed broken teeth and dental disease in the upper left, but no evidence of post-operative infection or active bleeding.  As patient is able to control the bleeding with pressure I think it is reasonable to discharge her with return precautions.  Encouraged her to hold pressure for at least 15 to 20 minutes without checking if she has repeat bleeding, and to call her dental surgeon on Monday if she continues to have intermittent bleeding all weekend.  Discussed return precautions for significant bleeding and not improved with pressure.  Patient discharged in stable condition at this time, return precautions given. Final Clinical Impression(s) / ED Diagnoses Final diagnoses:  Bleeding gums    Rx / DC Orders ED Discharge Orders     None         Dorien Chihuahua 06/06/22 1858    Hayden Rasmussen, MD 06/07/22 1000

## 2022-06-06 NOTE — Progress Notes (Signed)
Pt had a tooth extracted (top left molar) on 10/25 and began having bleeding from that site last night. Daughter reports that there have been two episodes of bleeding with one that took over 45 minutes to stop with pressure and gauze.  There has been no more bleeding over the past 3 hours. Dentist's office is closed today.  Pt reports eating dinner last night at a [employer] and had chips. INR today is 2.3. Pt denies any c/o dizziness or weakness. Advised pt and daughter to seek care if there are any more episodes of bleeding lasting 30 minutes or longer and to eat a soft diet over the next week. The chips last night could have potentially caused trauma to the site of tooth extraction.  Continue to take warfarin one tablet daily and repeat INR check at previously scheduled anticoagulation clinic appointment on 12/15.

## 2022-06-06 NOTE — Discharge Instructions (Signed)
Apply a cotton ball or gauze for 20 minutes without checking, or a black tea bag that is moist, and follow-up with your dentist as needed.  If you have bleeding that is uncontrolled despite pressure please return to the ED

## 2022-06-07 ENCOUNTER — Encounter (HOSPITAL_BASED_OUTPATIENT_CLINIC_OR_DEPARTMENT_OTHER): Payer: Self-pay | Admitting: Emergency Medicine

## 2022-06-07 ENCOUNTER — Emergency Department (HOSPITAL_BASED_OUTPATIENT_CLINIC_OR_DEPARTMENT_OTHER)
Admission: EM | Admit: 2022-06-07 | Discharge: 2022-06-08 | Disposition: A | Discharge status: Home / Self Care | Payer: Medicare PPO | Attending: Emergency Medicine | Admitting: Emergency Medicine

## 2022-06-07 DIAGNOSIS — Z7901 Long term (current) use of anticoagulants: Secondary | ICD-10-CM | POA: Diagnosis not present

## 2022-06-07 DIAGNOSIS — K0889 Other specified disorders of teeth and supporting structures: Secondary | ICD-10-CM | POA: Diagnosis not present

## 2022-06-07 DIAGNOSIS — K068 Other specified disorders of gingiva and edentulous alveolar ridge: Secondary | ICD-10-CM | POA: Diagnosis not present

## 2022-06-07 DIAGNOSIS — K1379 Other lesions of oral mucosa: Secondary | ICD-10-CM | POA: Diagnosis not present

## 2022-06-07 LAB — CBC WITH DIFFERENTIAL/PLATELET
Abs Immature Granulocytes: 0.03 10*3/uL (ref 0.00–0.07)
Basophils Absolute: 0 10*3/uL (ref 0.0–0.1)
Basophils Relative: 1 %
Eosinophils Absolute: 0.3 10*3/uL (ref 0.0–0.5)
Eosinophils Relative: 5 %
HCT: 36.2 % (ref 36.0–46.0)
Hemoglobin: 11.6 g/dL — ABNORMAL LOW (ref 12.0–15.0)
Immature Granulocytes: 1 %
Lymphocytes Relative: 12 %
Lymphs Abs: 0.7 10*3/uL (ref 0.7–4.0)
MCH: 30.9 pg (ref 26.0–34.0)
MCHC: 32 g/dL (ref 30.0–36.0)
MCV: 96.5 fL (ref 80.0–100.0)
Monocytes Absolute: 0.7 10*3/uL (ref 0.1–1.0)
Monocytes Relative: 12 %
Neutro Abs: 4.3 10*3/uL (ref 1.7–7.7)
Neutrophils Relative %: 69 %
Platelets: 156 10*3/uL (ref 150–400)
RBC: 3.75 MIL/uL — ABNORMAL LOW (ref 3.87–5.11)
RDW: 14.2 % (ref 11.5–15.5)
WBC: 6.1 10*3/uL (ref 4.0–10.5)
nRBC: 0 % (ref 0.0–0.2)

## 2022-06-07 LAB — PROTIME-INR
INR: 2.1 — ABNORMAL HIGH (ref 0.8–1.2)
Prothrombin Time: 23.5 seconds — ABNORMAL HIGH (ref 11.4–15.2)

## 2022-06-07 NOTE — ED Triage Notes (Signed)
Pt returns for continued oral bleeding s/p tooth extraction 1 1/2 weeks ago; started about 1 1/2 hrs ago

## 2022-06-07 NOTE — ED Provider Notes (Signed)
[facility] Provider Note   CSN: [id number] Arrival date & time: 06/07/22  2036     History  Chief Complaint  Patient presents with   Mouth Bleeding    Holly Simmons is a 81 y.o. female.  HPI 81 year old female presents with bleeding from her mouth. Started yesterday. She had a mandibular tooth extracted on the left side about 1 and 1/2 weeks ago. Was doing fine until yesterday, developed on and off bleeding. Was seen here. Her dentist doesn't have call over the weekend, they are planning on calling tomorrow.  This morning in the middle of the night/early morning, patient woke up with blood on her and some transient bleeding that stopped with pressure.  Happened again on and off throughout the day but not as bad until this evening where was bleeding for about 2 hours.  She is on warfarin for a mechanical heart valve.  Currently there was bleeding when she got into the room and so she has gauze in her mouth and has been applying pressure by biting down.  Home Medications Prior to Admission medications   Medication Sig Start Date End Date Taking? Authorizing Provider  allopurinol (ZYLOPRIM) 100 MG tablet Take 1 tablet (100 mg total) by mouth daily. 12/08/21   Janith Lima, MD  amoxicillin (AMOXIL) 500 MG capsule TAKE 4 CAPSULES 1 HOUR PRIOR TO PROCEDURE 01/10/21   Josue Hector, MD  ARIPiprazole (ABILIFY) 2 MG tablet TAKE ONE TABLET BY MOUTH DAILY 05/11/22   Janith Lima, MD  Betamethasone Valerate 0.12 % foam APPLY TO SCALP AS DIRECTED. Patient taking differently: 1 application  once a week. 12/11/20   Janith Lima, MD  buPROPion (WELLBUTRIN XL) 300 MG 24 hr tablet TAKE ONE TABLET BY MOUTH ONCE DAILY 05/11/22   Janith Lima, MD  dronabinol (MARINOL) 5 MG capsule Take 1 capsule (5 mg total) by mouth 2 (two) times daily before lunch and supper. 12/08/21   Janith Lima, MD  famotidine (PEPCID) 40 MG tablet Take 1 tablet (40 mg total) by mouth  daily. 12/08/21   Janith Lima, MD  ferrous sulfate 325 (65 FE) MG tablet Take 1 tablet (325 mg total) by mouth 2 (two) times daily with a meal. Patient taking differently: Take 325-650 mg by mouth daily with breakfast. 11/10/20   Janith Lima, MD  ipratropium (ATROVENT) 0.03 % nasal spray USE 2 SPRAYS IN EACH NOSTRIL 3 TIMES A DAY AS NEEDED. Patient taking differently: Place 2 sprays into both nostrils daily as needed (nose bleed). 04/29/18   [name], MD  lamoTRIgine (LAMICTAL) 25 MG tablet TAKE ONE TABLET BY MOUTH ONCE DAILY 05/11/22   Janith Lima, MD  metoprolol tartrate (LOPRESSOR) 25 MG tablet Take 1 tablet (25 mg total) by mouth 2 (two) times daily. 04/09/22   Janith Lima, MD  modafinil (PROVIGIL) 100 MG tablet Take 1 tablet (100 mg total) by mouth daily. Patient taking differently: Take 100 mg by mouth daily as needed (sleep disorder). 03/29/18   [name], MD  Pitavastatin Calcium 2 MG TABS Take 1 tablet (2 mg total) by mouth daily. 03/03/22   Janith Lima, MD  PRESCRIPTION MEDICATION 1 Dose by Other route at bedtime as needed (sleep). CPAP    [provider]  Propylene Glycol (SYSTANE BALANCE OP) Place 1 drop into both eyes 4 (four) times daily as needed (dry eyes).    [provider]  SYMBICORT 80-4.5  MCG/ACT inhaler USE 2 PUFFS TWICE A DAY. Patient taking differently: 2 puffs 2 (two) times daily. 10/11/20   Janith Lima, MD  torsemide (DEMADEX) 20 MG tablet Take 1 tablet (20 mg total) by mouth daily. 04/09/22   Janith Lima, MD  Vilazodone HCl (VIIBRYD) 40 MG TABS Take 1 tablet (40 mg total) by mouth daily. 12/08/21   Janith Lima, MD  warfarin (COUMADIN) 1 MG tablet Take 1 tablet (1 mg total) by mouth daily. TAKE 1 1/2 TABLET EVERY WEDNESDAY OR AS DIRECTED BY ANTICOAGULATION LCINIC 02/26/22   Janith Lima, MD  warfarin (COUMADIN) 2.5 MG tablet Take 1 tablet (2.5 mg total) by mouth daily. TAKE 1 TABLET BY MOUTH DAILY EXCEPT ON [facility] 04/09/22    Janith Lima, MD      Allergies    Petrolatum-zinc oxide; Sulfa antibiotics; Sulfonamide derivatives; Tetanus toxoid; Tetanus toxoids; Other; Nsaids; and Tetanus toxoid, adsorbed    Review of Systems   Review of Systems  HENT:  Positive for dental problem.     Physical Exam Updated Vital Signs BP (!) 143/66   Pulse 61   Resp 16   SpO2 100%  Physical Exam Vitals and nursing note reviewed.  Constitutional:      Appearance: She is well-developed.  HENT:     Head: Normocephalic and atraumatic.     Mouth/Throat:   Pulmonary:     Effort: Pulmonary effort is normal.  Abdominal:     General: There is no distension.  Skin:    General: Skin is warm and dry.  Neurological:     Mental Status: She is alert.     ED Results / Procedures / Treatments   Labs (all labs ordered are listed, but only abnormal results are displayed) Labs Reviewed  PROTIME-INR - Abnormal; Notable for the following components:      Result Value   Prothrombin Time 23.5 (*)    INR 2.1 (*)    All other components within normal limits  CBC WITH DIFFERENTIAL/PLATELET - Abnormal; Notable for the following components:   RBC 3.75 (*)    Hemoglobin 11.6 (*)    All other components within normal limits    EKG None  Radiology No results found.  Procedures Procedures    Medications Ordered in ED Medications - No data to display  ED Course/ Medical Decision Making/ A&P                           Medical Decision Making Amount and/or Complexity of Data Reviewed External Data Reviewed: notes. Labs: ordered.    Details: Hemoglobin of 11.6 is slightly lower than 12 from yesterday.  INR of 2.1 mildly less than yesterday   Surgicel was placed on the tooth extraction site which has a small clot on it.  I do not think removing a small clot is a good idea.  Otherwise, no overt infection.  She was able to bite down on this with gauze for 20+ minutes and has no recurrent bleeding.  At this point, I think  is reasonable to discharge her to follow-up with dentistry in 2 days (Monday).  We discussed return precautions but otherwise she appears well and family is reassured by her hemoglobin being okay.        Final Clinical Impression(s) / ED Diagnoses Final diagnoses:  Oral bleeding    Rx / DC Orders ED Discharge Orders     None  Sherwood Gambler, MD 06/07/22 [phone]

## 2022-06-07 NOTE — Discharge Instructions (Signed)
If you develop new or worsening or uncontrolled bleeding, fever, trouble breathing, or any other new/concerning symptoms then return to the ER.  Ideally return to [facility] or [facility] as there they could get a dentist to consult and see you.  However we will gladly see you here if you choose or cannot go anywhere else.

## 2022-06-13 ENCOUNTER — Other Ambulatory Visit: Payer: Self-pay | Admitting: Internal Medicine

## 2022-06-13 DIAGNOSIS — K219 Gastro-esophageal reflux disease without esophagitis: Secondary | ICD-10-CM

## 2022-06-13 DIAGNOSIS — R64 Cachexia: Secondary | ICD-10-CM

## 2022-06-17 ENCOUNTER — Telehealth: Payer: Self-pay | Admitting: Internal Medicine

## 2022-06-17 NOTE — Telephone Encounter (Signed)
N/A unable to leave a message for patient to call back to schedule Medicare Annual Wellness Visit   Last AWV  06/20/21  Please schedule at anytime with [facility] if patient calls the office back.      Any questions, please call me at [phone]

## 2022-06-27 ENCOUNTER — Other Ambulatory Visit: Payer: Self-pay

## 2022-06-27 ENCOUNTER — Emergency Department (HOSPITAL_BASED_OUTPATIENT_CLINIC_OR_DEPARTMENT_OTHER): Payer: Medicare PPO

## 2022-06-27 ENCOUNTER — Encounter (HOSPITAL_BASED_OUTPATIENT_CLINIC_OR_DEPARTMENT_OTHER): Payer: Self-pay | Admitting: Emergency Medicine

## 2022-06-27 DIAGNOSIS — R531 Weakness: Secondary | ICD-10-CM | POA: Diagnosis not present

## 2022-06-27 DIAGNOSIS — J449 Chronic obstructive pulmonary disease, unspecified: Secondary | ICD-10-CM | POA: Insufficient documentation

## 2022-06-27 DIAGNOSIS — I129 Hypertensive chronic kidney disease with stage 1 through stage 4 chronic kidney disease, or unspecified chronic kidney disease: Secondary | ICD-10-CM | POA: Insufficient documentation

## 2022-06-27 DIAGNOSIS — K91840 Postprocedural hemorrhage and hematoma of a digestive system organ or structure following a digestive system procedure: Secondary | ICD-10-CM | POA: Diagnosis not present

## 2022-06-27 DIAGNOSIS — J069 Acute upper respiratory infection, unspecified: Secondary | ICD-10-CM | POA: Diagnosis not present

## 2022-06-27 DIAGNOSIS — Z7901 Long term (current) use of anticoagulants: Secondary | ICD-10-CM | POA: Diagnosis not present

## 2022-06-27 DIAGNOSIS — R059 Cough, unspecified: Secondary | ICD-10-CM | POA: Diagnosis not present

## 2022-06-27 DIAGNOSIS — N390 Urinary tract infection, site not specified: Secondary | ICD-10-CM | POA: Insufficient documentation

## 2022-06-27 DIAGNOSIS — Z79899 Other long term (current) drug therapy: Secondary | ICD-10-CM | POA: Insufficient documentation

## 2022-06-27 DIAGNOSIS — N183 Chronic kidney disease, stage 3 unspecified: Secondary | ICD-10-CM | POA: Insufficient documentation

## 2022-06-27 DIAGNOSIS — Z20822 Contact with and (suspected) exposure to covid-19: Secondary | ICD-10-CM | POA: Insufficient documentation

## 2022-06-27 DIAGNOSIS — E1122 Type 2 diabetes mellitus with diabetic chronic kidney disease: Secondary | ICD-10-CM | POA: Diagnosis not present

## 2022-06-27 LAB — RESP PANEL BY RT-PCR (FLU A&B, COVID) ARPGX2
Influenza A by PCR: NEGATIVE
Influenza B by PCR: NEGATIVE
SARS Coronavirus 2 by RT PCR: NEGATIVE

## 2022-06-27 NOTE — ED Triage Notes (Signed)
Coughing and chest congestion for last 4 days. Some weakness

## 2022-06-28 ENCOUNTER — Emergency Department (HOSPITAL_BASED_OUTPATIENT_CLINIC_OR_DEPARTMENT_OTHER)
Admission: EM | Admit: 2022-06-28 | Discharge: 2022-06-28 | Disposition: A | Discharge status: Home / Self Care | Payer: Medicare PPO | Attending: Emergency Medicine | Admitting: Emergency Medicine

## 2022-06-28 DIAGNOSIS — R051 Acute cough: Secondary | ICD-10-CM

## 2022-06-28 DIAGNOSIS — N390 Urinary tract infection, site not specified: Secondary | ICD-10-CM

## 2022-06-28 DIAGNOSIS — K1379 Other lesions of oral mucosa: Secondary | ICD-10-CM

## 2022-06-28 LAB — CBC WITH DIFFERENTIAL/PLATELET
Abs Immature Granulocytes: 0.05 10*3/uL (ref 0.00–0.07)
Basophils Absolute: 0 10*3/uL (ref 0.0–0.1)
Basophils Relative: 0 %
Eosinophils Absolute: 0.1 10*3/uL (ref 0.0–0.5)
Eosinophils Relative: 1 %
HCT: 35.8 % — ABNORMAL LOW (ref 36.0–46.0)
Hemoglobin: 11.3 g/dL — ABNORMAL LOW (ref 12.0–15.0)
Immature Granulocytes: 0 %
Lymphocytes Relative: 6 %
Lymphs Abs: 0.7 10*3/uL (ref 0.7–4.0)
MCH: 29.7 pg (ref 26.0–34.0)
MCHC: 31.6 g/dL (ref 30.0–36.0)
MCV: 94.2 fL (ref 80.0–100.0)
Monocytes Absolute: 2 10*3/uL — ABNORMAL HIGH (ref 0.1–1.0)
Monocytes Relative: 17 %
Neutro Abs: 9.1 10*3/uL — ABNORMAL HIGH (ref 1.7–7.7)
Neutrophils Relative %: 76 %
Platelets: 145 10*3/uL — ABNORMAL LOW (ref 150–400)
RBC: 3.8 MIL/uL — ABNORMAL LOW (ref 3.87–5.11)
RDW: 14.4 % (ref 11.5–15.5)
WBC: 11.9 10*3/uL — ABNORMAL HIGH (ref 4.0–10.5)
nRBC: 0 % (ref 0.0–0.2)

## 2022-06-28 LAB — URINALYSIS, ROUTINE W REFLEX MICROSCOPIC
Bilirubin Urine: NEGATIVE
Glucose, UA: NEGATIVE mg/dL
Hgb urine dipstick: NEGATIVE
Ketones, ur: NEGATIVE mg/dL
Nitrite: NEGATIVE
Protein, ur: 30 mg/dL — AB
Specific Gravity, Urine: 1.015 (ref 1.005–1.030)
pH: 5.5 (ref 5.0–8.0)

## 2022-06-28 LAB — BASIC METABOLIC PANEL
Anion gap: 16 — ABNORMAL HIGH (ref 5–15)
BUN: 38 mg/dL — ABNORMAL HIGH (ref 8–23)
CO2: 21 mmol/L — ABNORMAL LOW (ref 22–32)
Calcium: 9.2 mg/dL (ref 8.9–10.3)
Chloride: 99 mmol/L (ref 98–111)
Creatinine, Ser: 1.81 mg/dL — ABNORMAL HIGH (ref 0.44–1.00)
GFR, Estimated: 28 mL/min — ABNORMAL LOW (ref >60)
Glucose, Bld: 116 mg/dL — ABNORMAL HIGH (ref 70–99)
Potassium: 4.1 mmol/L (ref 3.5–5.1)
Sodium: 136 mmol/L (ref 135–145)

## 2022-06-28 MED ORDER — TRANEXAMIC ACID 1000 MG/10ML IV SOLN
500.0000 mg | Freq: Once | INTRAVENOUS | Status: AC
  Administered 2022-06-28: 500 mg via TOPICAL
  Filled 2022-06-28: qty 10

## 2022-06-28 MED ORDER — BENZONATATE 100 MG PO CAPS: 100.0000 mg | ORAL_CAPSULE | Freq: Three times a day (TID) | ORAL | 0 refills | Status: DC

## 2022-06-28 MED ORDER — CEPHALEXIN 500 MG PO CAPS: 500.0000 mg | ORAL_CAPSULE | Freq: Two times a day (BID) | ORAL | 0 refills | Status: DC

## 2022-06-28 MED ORDER — CEPHALEXIN 250 MG PO CAPS
500.0000 mg | ORAL_CAPSULE | Freq: Once | ORAL | Status: AC
  Administered 2022-06-28: 500 mg via ORAL
  Filled 2022-06-28: qty 2

## 2022-06-28 NOTE — ED Notes (Signed)
Pt. Able to stand up under her own power with supervision from this RN. Pt holding water cup very well on her own. Pt did spill med cup due to it slipping out of her hands. Per family patient has not been sleeping well "at all" for the past 5 nights. Educated patient and family on weakness when feeling sick and proper nutrition.

## 2022-06-28 NOTE — ED Provider Notes (Signed)
[facility] DEPT Provider Note   CSN: [id number] Arrival date & time: 06/27/22  2234     History  Chief Complaint  Patient presents with   Cough    Holly Simmons is a 81 y.o. female.  The history is provided by the patient and a relative.  Cough Severity:  Moderate Onset quality:  Gradual Duration:  4 days Timing:  Intermittent Progression:  Unchanged Chronicity:  New Context: not smoke exposure   Relieved by:  Nothing Worsened by:  Nothing Ineffective treatments:  None tried Associated symptoms: no fever and no wheezing   Associated symptoms comment:  Fatigue  Risk factors: no recent travel   Patient with COPD, OSA and renal insufficiency oral bleeding on coumadin presents with cough, fatigue and oral bleeding.     Past Medical History:  Diagnosis Date   Allergic rhinitis    Asthma    NOS w/ acute exacerbation   Blood transfusion without reported diagnosis    CKD (chronic kidney disease) stage 3, GFR 30-59 ml/min (HCC) 09/12/2018   Colon polyps    TUBULAR ADENOMAS AND HYPERPLASTIC   Complete heart block (HCC)    requiring PPM (MDT) post surgical myomectomy at [facility],  leads are epicardial with abdominal implant, high ventricular threshold at implant   COPD (chronic obstructive pulmonary disease) (HCC)    Depressive disorder    Diastolic dysfunction    DM (diabetes mellitus) ([place])    Gallstones    GERD (gastroesophageal reflux disease)    Gout 08/20/2012   Heart murmur    Hyperpotassemia    Hypersomnia    Hypertension    Hypertrophic cardiomyopathy ([place])    s/p surgical myomectomy at [facility] 79/02 complicated by septal VSD post procedure requiring reoperation with patch closure and tricuspid valve replacement   Kidney stones    Myocardial infarction ([place]) 2011   Obstructive sleep apnea    persistent daytime sleepiness despite cpap   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   Psoriasis    Pyuria    Renal insufficiency    Tricuspid valve replaced     MDT 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 Authorizing Provider  benzonatate (TESSALON) 100 MG capsule Take 1 capsule (100 mg total) by mouth every 8 (eight) hours. 06/28/22  Yes Wetona Viramontes, MD  cephALEXin (KEFLEX) 500 MG capsule Take 1 capsule (500 mg total) by mouth 2 (two) times daily. 06/28/22  Yes Suzzette Gasparro, MD  allopurinol (ZYLOPRIM) 100 MG tablet Take 1 tablet by mouth daily. 06/13/22   JJanith Lima MD  amoxicillin (AMOXIL) 500 MG capsule TAKE 4 CAPSULES 1 HOUR PRIOR TO PROCEDURE 01/10/21   NJosue Hector MD  ARIPiprazole (ABILIFY) 2 MG tablet TAKE ONE TABLET BY MOUTH DAILY 05/11/22   JJanith Lima MD  Betamethasone Valerate 0.12 % foam APPLY TO SCALP AS DIRECTED. Patient taking differently: 1 application  once a week. 12/11/20   JJanith Lima MD  buPROPion (WELLBUTRIN XL) 300 MG 24 hr tablet TAKE ONE TABLET BY MOUTH ONCE DAILY 05/11/22   JJanith Lima MD  dronabinol (MARINOL) 5 MG capsule Take 1 capsule by mouth two times daily before lunch and supper. 06/13/22   JJanith Lima MD  famotidine (PEPCID) 40 MG tablet Take 1 tablet by mouth daily. 06/13/22   JJanith Lima MD  ferrous sulfate 325 (65 FE) MG tablet Take  1 tablet (325 mg total) by mouth 2 (two) times daily with a meal. Patient taking differently: Take 325-650 mg by mouth daily with breakfast. 11/10/20   Janith Lima, MD  ipratropium (ATROVENT) 0.03 % nasal spray USE 2 SPRAYS IN EACH NOSTRIL 3 TIMES A DAY AS NEEDED. Patient taking differently: Place 2 sprays into both nostrils daily as needed (nose bleed). 04/29/18   [name], MD  lamoTRIgine (LAMICTAL) 25 MG tablet TAKE ONE TABLET BY MOUTH ONCE DAILY 05/11/22   Janith Lima, MD  metoprolol tartrate (LOPRESSOR) 25 MG tablet Take 1 tablet (25 mg total) by mouth 2 (two) times daily. 04/09/22   Janith Lima, MD  modafinil (PROVIGIL)  100 MG tablet Take 1 tablet (100 mg total) by mouth daily. Patient taking differently: Take 100 mg by mouth daily as needed (sleep disorder). 03/29/18   [name], MD  Pitavastatin Calcium 2 MG TABS Take 1 tablet (2 mg total) by mouth daily. 03/03/22   Janith Lima, MD  PRESCRIPTION MEDICATION 1 Dose by Other route at bedtime as needed (sleep). CPAP    [provider]  Propylene Glycol (SYSTANE BALANCE OP) Place 1 drop into both eyes 4 (four) times daily as needed (dry eyes).    [provider]  SYMBICORT 80-4.5 MCG/ACT inhaler USE 2 PUFFS TWICE A DAY. Patient taking differently: 2 puffs 2 (two) times daily. 10/11/20   Janith Lima, MD  torsemide (DEMADEX) 20 MG tablet Take 1 tablet (20 mg total) by mouth daily. 04/09/22   Janith Lima, MD  Vilazodone HCl (VIIBRYD) 40 MG TABS Take 1 tablet by mouth daily. 06/13/22   Janith Lima, MD  warfarin (COUMADIN) 1 MG tablet Take 1 tablet (1 mg total) by mouth daily. TAKE 1 1/2 TABLET EVERY WEDNESDAY OR AS DIRECTED BY ANTICOAGULATION LCINIC 02/26/22   Janith Lima, MD  warfarin (COUMADIN) 2.5 MG tablet Take 1 tablet (2.5 mg total) by mouth daily. TAKE 1 TABLET BY MOUTH DAILY EXCEPT ON [facility] 04/09/22   Janith Lima, MD      Allergies    Petrolatum-zinc oxide; Sulfa antibiotics; Sulfonamide derivatives; Tetanus toxoid; Tetanus toxoids; Other; Nsaids; and Tetanus toxoid, adsorbed    Review of Systems   Review of Systems  Constitutional:  Negative for fever.  HENT:  Negative for facial swelling.   Eyes:  Negative for redness.  Respiratory:  Positive for cough. Negative for wheezing and stridor.     Physical Exam Updated Vital Signs BP (!) 132/59   Pulse (!) 59   Temp 99.2 F (37.3 C) (Oral)   Resp 18   SpO2 98%  Physical Exam Vitals and nursing note reviewed.  Constitutional:      General: She is not in acute distress.    Appearance: Normal appearance. She is well-developed.  HENT:     Head: Normocephalic  and atraumatic.     Nose: Nose normal.     Mouth/Throat:     Mouth: Mucous membranes are moist.  Eyes:     Pupils: Pupils are equal, round, and reactive to light.  Cardiovascular:     Rate and Rhythm: Normal rate and regular rhythm.     Pulses: Normal pulses.     Heart sounds: Normal heart sounds.  Pulmonary:     Effort: Pulmonary effort is normal. No respiratory distress.     Breath sounds: Normal breath sounds. No wheezing, rhonchi or rales.  Abdominal:     General: Bowel sounds are  normal. There is no distension.     Palpations: Abdomen is soft.     Tenderness: There is no abdominal tenderness. There is no guarding or rebound.  Genitourinary:    Vagina: No vaginal discharge.  Musculoskeletal:        General: Normal range of motion.     Cervical back: Neck supple.     Right lower leg: No edema.     Left lower leg: No edema.  Skin:    General: Skin is warm and dry.     Capillary Refill: Capillary refill takes less than 2 seconds.     Findings: No erythema or rash.  Neurological:     General: No focal deficit present.     Mental Status: She is alert.     Deep Tendon Reflexes: Reflexes normal.  Psychiatric:        Mood and Affect: Mood normal.     ED Results / Procedures / Treatments   Labs (all labs ordered are listed, but only abnormal results are displayed) Results for orders placed or performed during the hospital encounter of 06/28/22  Resp Panel by RT-PCR (Flu A&B, Covid) Anterior Nasal Swab   Specimen: Anterior Nasal Swab  Result Value Ref Range   SARS Coronavirus 2 by RT PCR NEGATIVE NEGATIVE   Influenza A by PCR NEGATIVE NEGATIVE   Influenza B by PCR NEGATIVE NEGATIVE  CBC with Differential  Result Value Ref Range   WBC 11.9 (H) 4.0 - 10.5 K/uL   RBC 3.80 (L) 3.87 - 5.11 MIL/uL   Hemoglobin 11.3 (L) 12.0 - 15.0 g/dL   HCT 35.8 (L) 36.0 - 46.0 %   MCV 94.2 80.0 - 100.0 fL   MCH 29.7 26.0 - 34.0 pg   MCHC 31.6 30.0 - 36.0 g/dL   RDW 14.4 11.5 - 15.5 %    Platelets 145 (L) 150 - 400 K/uL   nRBC 0.0 0.0 - 0.2 %   Neutrophils Relative % 76 %   Neutro Abs 9.1 (H) 1.7 - 7.7 K/uL   Lymphocytes Relative 6 %   Lymphs Abs 0.7 0.7 - 4.0 K/uL   Monocytes Relative 17 %   Monocytes Absolute 2.0 (H) 0.1 - 1.0 K/uL   Eosinophils Relative 1 %   Eosinophils Absolute 0.1 0.0 - 0.5 K/uL   Basophils Relative 0 %   Basophils Absolute 0.0 0.0 - 0.1 K/uL   Immature Granulocytes 0 %   Abs Immature Granulocytes 0.05 0.00 - 0.07 K/uL  Basic metabolic panel  Result Value Ref Range   Sodium 136 135 - 145 mmol/L   Potassium 4.1 3.5 - 5.1 mmol/L   Chloride 99 98 - 111 mmol/L   CO2 21 (L) 22 - 32 mmol/L   Glucose, Bld 116 (H) 70 - 99 mg/dL   BUN 38 (H) 8 - 23 mg/dL   Creatinine, Ser 1.81 (H) 0.44 - 1.00 mg/dL   Calcium 9.2 8.9 - 10.3 mg/dL   GFR, Estimated 28 (L) >60 mL/min   Anion gap 16 (H) 5 - 15  Urinalysis, Routine w reflex microscopic Urine, Clean Catch  Result Value Ref Range   Color, Urine YELLOW YELLOW   APPearance CLEAR CLEAR   Specific Gravity, Urine 1.015 1.005 - 1.030   pH 5.5 5.0 - 8.0   Glucose, UA NEGATIVE NEGATIVE mg/dL   Hgb urine dipstick NEGATIVE NEGATIVE   Bilirubin Urine NEGATIVE NEGATIVE   Ketones, ur NEGATIVE NEGATIVE mg/dL   Protein, ur 30 (A) NEGATIVE mg/dL  Nitrite NEGATIVE NEGATIVE   Leukocytes,Ua TRACE (A) NEGATIVE   RBC / HPF 0-5 0 - 5 RBC/hpf   WBC, UA 6-10 0 - 5 WBC/hpf   Squamous Epithelial / LPF 0-5 0 - 5   Hyaline Casts, UA PRESENT    Granular Casts, UA PRESENT    *Note: Due to a large number of results and/or encounters for the requested time period, some results have not been displayed. A complete set of results can be found in Results Review.   DG Chest Portable 1 View  Result Date: 06/27/2022 CLINICAL DATA:  Coughing and chest congestion for 4 days.  Weakness. EXAM: PORTABLE CHEST 1 VIEW COMPARISON:  09/05/2021. FINDINGS: Heart is enlarged and the mediastinal contour is within normal limits. Atherosclerotic  calcification of the aorta is noted. No consolidation, effusion, or pneumothorax. Sternotomy wires are noted over the midline. A multi lead pacemaker device is present over the left chest. No acute osseous abnormality. IMPRESSION: Cardiomegaly with no active disease. Electronically Signed   By: Brett Fairy M.D.   On: 06/27/2022 23:22    EKG None  Radiology DG Chest Portable 1 View  Result Date: 06/27/2022 CLINICAL DATA:  Coughing and chest congestion for 4 days.  Weakness. EXAM: PORTABLE CHEST 1 VIEW COMPARISON:  09/05/2021. FINDINGS: Heart is enlarged and the mediastinal contour is within normal limits. Atherosclerotic calcification of the aorta is noted. No consolidation, effusion, or pneumothorax. Sternotomy wires are noted over the midline. A multi lead pacemaker device is present over the left chest. No acute osseous abnormality. IMPRESSION: Cardiomegaly with no active disease. Electronically Signed   By: Brett Fairy M.D.   On: 06/27/2022 23:22    Procedures Procedures    Medications Ordered in ED Medications  tranexamic acid (CYKLOKAPRON) injection 500 mg (500 mg Topical Given 06/28/22 0244)  cephALEXin (KEFLEX) capsule 500 mg (500 mg Oral Given 06/28/22 0531)    ED Course/ Medical Decision Making/ A&P                           Medical Decision Making Patient with 4 days of cough, 5 days of not sleeping well and fatigue and now recurrent oral bleeding   Problems Addressed: Acute cough:    Details: Tessalon and close follow up with PMD Oral bleeding:    Details: TXA topically stopped bleeding  Urinary tract infection without hematuria, site unspecified:    Details: Treated with keflex  Amount and/or Complexity of Data Reviewed Independent Historian:     Details: Son and daughter, see above  External Data Reviewed: notes.    Details: Previous notes reviewed  Labs: ordered.    Details: All labs reviewed: white count 11.9, hemoglobin low at 11.3, normal platelets.   Normal sodium 136 normal potassium 4.1 elevated creatinine 1.81.  negative covid and flu.  Urine with very mild UTI Radiology: ordered and independent interpretation performed.    Details: No PNA by me   Risk Prescription drug management. Risk Details: Oral bleeding has stopped, call your oral surgeon.  RT has also examined and concurs no acute findings in the lungs.  Will start tessalon and keflex for UTI.  Stable for discharge.  Close follow up with repeat labs with PMD.  Strict return.      Final Clinical Impression(s) / ED Diagnoses Final diagnoses:  Acute cough  Oral bleeding  Urinary tract infection without hematuria, site unspecified  eturn for intractable cough, coughing up blood, fevers > 100.4  unrelieved by medication, shortness of breath, intractable vomiting, chest pain, shortness of breath, weakness, numbness, changes in speech, facial asymmetry, abdominal pain, passing out, Inability to tolerate liquids or food, cough, altered mental status or any concerns. No signs of systemic illness or infection. The patient is nontoxic-appearing on exam and vital signs are within normal limits.  I have reviewed the triage vital signs and the nursing notes. Pertinent labs & imaging results that were available during my care of the patient were reviewed by me and considered in my medical decision making (see chart for details). After history, exam, and medical workup I feel the patient has been appropriately medically screened and is safe for discharge home. Pertinent diagnoses were discussed with the patient. Patient was given return precautions.   Rx / DC Orders ED Discharge Orders          Ordered    cephALEXin (KEFLEX) 500 MG capsule  2 times daily        06/28/22 0524    benzonatate (TESSALON) 100 MG capsule  Every 8 hours        06/28/22 0525              Lambros Cerro, MD 06/28/22 0540

## 2022-06-29 ENCOUNTER — Encounter: Payer: Self-pay | Admitting: Internal Medicine

## 2022-07-01 ENCOUNTER — Encounter: Payer: Self-pay | Admitting: Internal Medicine

## 2022-07-01 ENCOUNTER — Ambulatory Visit: Payer: Medicare PPO | Admitting: Internal Medicine

## 2022-07-01 ENCOUNTER — Other Ambulatory Visit: Payer: Self-pay | Admitting: Internal Medicine

## 2022-07-01 ENCOUNTER — Telehealth: Payer: Self-pay

## 2022-07-01 ENCOUNTER — Ambulatory Visit (INDEPENDENT_AMBULATORY_CARE_PROVIDER_SITE_OTHER): Payer: Medicare PPO

## 2022-07-01 VITALS — BP 122/62 | HR 88 | Temp 98.2°F | Ht 62.0 in | Wt 163.0 lb

## 2022-07-01 DIAGNOSIS — J22 Unspecified acute lower respiratory infection: Secondary | ICD-10-CM | POA: Diagnosis not present

## 2022-07-01 DIAGNOSIS — N3000 Acute cystitis without hematuria: Secondary | ICD-10-CM

## 2022-07-01 DIAGNOSIS — I5032 Chronic diastolic (congestive) heart failure: Secondary | ICD-10-CM | POA: Diagnosis not present

## 2022-07-01 DIAGNOSIS — R058 Other specified cough: Secondary | ICD-10-CM

## 2022-07-01 DIAGNOSIS — N1832 Chronic kidney disease, stage 3b: Secondary | ICD-10-CM | POA: Diagnosis not present

## 2022-07-01 DIAGNOSIS — R059 Cough, unspecified: Secondary | ICD-10-CM | POA: Diagnosis not present

## 2022-07-01 LAB — URINALYSIS, ROUTINE W REFLEX MICROSCOPIC
Bilirubin Urine: NEGATIVE
Hgb urine dipstick: NEGATIVE
Ketones, ur: NEGATIVE
Leukocytes,Ua: NEGATIVE
Nitrite: NEGATIVE
Specific Gravity, Urine: 1.025 (ref 1.000–1.030)
Urine Glucose: NEGATIVE
Urobilinogen, UA: 1 (ref 0.0–1.0)
pH: 5.5 (ref 5.0–8.0)

## 2022-07-01 LAB — TROPONIN I (HIGH SENSITIVITY): High Sens Troponin I: 19 ng/L (ref 2–17)

## 2022-07-01 LAB — BRAIN NATRIURETIC PEPTIDE: Pro B Natriuretic peptide (BNP): 431 pg/mL — ABNORMAL HIGH (ref 0.0–100.0)

## 2022-07-01 MED ORDER — PROMETHAZINE-DM 6.25-15 MG/5ML PO SYRP: 5.0000 mL | ORAL_SOLUTION | Freq: Four times a day (QID) | ORAL | 0 refills | Status: AC | PRN

## 2022-07-01 MED ORDER — AMOXICILLIN-POT CLAVULANATE 875-125 MG PO TABS: 1.0000 | ORAL_TABLET | Freq: Two times a day (BID) | ORAL | 0 refills | Status: AC

## 2022-07-01 MED ORDER — HYDROCODONE BIT-HOMATROP MBR 5-1.5 MG/5ML PO SOLN: 5.0000 mL | Freq: Three times a day (TID) | ORAL | 0 refills | Status: DC | PRN

## 2022-07-01 NOTE — Progress Notes (Signed)
Subjective:  Patient ID: Holly Simmons, female    DOB: May 10, 1941  Age: 81 y.o. MRN: [id number]  CC: Cough and Urinary Tract Infection   HPI Holly Simmons presents for f/up -  She was recently seen in the ED for altered mental status.  She has been treated for UTI with cephalexin.  She complains of a worsening cough that has become productive of thick yellow/brown phlegm.  Outpatient Medications Prior to Visit  Medication Sig Dispense Refill   allopurinol (ZYLOPRIM) 100 MG tablet Take 1 tablet by mouth daily. 90 tablet 1   amoxicillin (AMOXIL) 500 MG capsule TAKE 4 CAPSULES 1 HOUR PRIOR TO PROCEDURE 30 capsule 0   ARIPiprazole (ABILIFY) 2 MG tablet TAKE ONE TABLET BY MOUTH DAILY 90 tablet 0   benzonatate (TESSALON) 100 MG capsule Take 1 capsule (100 mg total) by mouth every 8 (eight) hours. 21 capsule 0   Betamethasone Valerate 0.12 % foam APPLY TO SCALP AS DIRECTED. (Patient taking differently: 1 application  once a week.) 100 g 2   buPROPion (WELLBUTRIN XL) 300 MG 24 hr tablet TAKE ONE TABLET BY MOUTH ONCE DAILY 90 tablet 0   dronabinol (MARINOL) 5 MG capsule Take 1 capsule by mouth two times daily before lunch and supper. 180 capsule 1   famotidine (PEPCID) 40 MG tablet Take 1 tablet by mouth daily. 90 tablet 1   ferrous sulfate 325 (65 FE) MG tablet Take 1 tablet (325 mg total) by mouth 2 (two) times daily with a meal. (Patient taking differently: Take 325-650 mg by mouth daily with breakfast.) 180 tablet 1   ipratropium (ATROVENT) 0.03 % nasal spray USE 2 SPRAYS IN EACH NOSTRIL 3 TIMES A DAY AS NEEDED. (Patient taking differently: Place 2 sprays into both nostrils daily as needed (nose bleed).) 30 mL 0   lamoTRIgine (LAMICTAL) 25 MG tablet TAKE ONE TABLET BY MOUTH ONCE DAILY 90 tablet 0   metoprolol tartrate (LOPRESSOR) 25 MG tablet Take 1 tablet (25 mg total) by mouth 2 (two) times daily. 180 tablet 1   modafinil (PROVIGIL) 100 MG tablet Take 1 tablet (100 mg total) by  mouth daily. (Patient taking differently: Take 100 mg by mouth daily as needed (sleep disorder).) 30 tablet 5   Pitavastatin Calcium 2 MG TABS Take 1 tablet (2 mg total) by mouth daily. 90 tablet 1   PRESCRIPTION MEDICATION 1 Dose by Other route at bedtime as needed (sleep). CPAP     Propylene Glycol (SYSTANE BALANCE OP) Place 1 drop into both eyes 4 (four) times daily as needed (dry eyes).     SYMBICORT 80-4.5 MCG/ACT inhaler USE 2 PUFFS TWICE A DAY. (Patient taking differently: 2 puffs 2 (two) times daily.) 30.6 g 5   torsemide (DEMADEX) 20 MG tablet Take 1 tablet (20 mg total) by mouth daily. 90 tablet 1   Vilazodone HCl (VIIBRYD) 40 MG TABS Take 1 tablet by mouth daily. 90 tablet 1   warfarin (COUMADIN) 1 MG tablet Take 1 tablet (1 mg total) by mouth daily. TAKE 1 1/2 TABLET EVERY WEDNESDAY OR AS DIRECTED BY ANTICOAGULATION LCINIC 20 tablet 0   warfarin (COUMADIN) 2.5 MG tablet Take 1 tablet (2.5 mg total) by mouth daily. TAKE 1 TABLET BY MOUTH DAILY EXCEPT ON WEDNESDAYS 180 tablet 1   cephALEXin (KEFLEX) 500 MG capsule Take 1 capsule (500 mg total) by mouth 2 (two) times daily. 14 capsule 0   No facility-administered medications prior to visit.    ROS Review of  Systems  Constitutional:  Positive for fatigue. Negative for chills, diaphoresis and fever.  HENT: Negative.    Respiratory:  Positive for cough and shortness of breath. Negative for chest tightness and wheezing.   Cardiovascular:  Negative for chest pain, palpitations and leg swelling.  Gastrointestinal:  Negative for abdominal pain, diarrhea and nausea.  Genitourinary:  Negative for difficulty urinating, dysuria and hematuria.  Musculoskeletal: Negative.  Negative for arthralgias and myalgias.  Skin: Negative.   Neurological: Negative.  Negative for dizziness and weakness.  Hematological:  Negative for adenopathy. Does not bruise/bleed easily.  Psychiatric/Behavioral: Negative.      Objective:  BP 122/62 (BP Location:  Right Arm, Patient Position: Sitting, Cuff Size: Large)   Pulse 88   Temp 98.2 F (36.8 C) (Oral)   Ht '5\' 2"'$  (1.575 m)   Wt 163 lb (73.9 kg)   SpO2 97%   BMI 29.81 kg/m   BP Readings from Last 3 Encounters:  07/01/22 122/62  06/28/22 (!) 132/59  06/07/22 136/63    Wt Readings from Last 3 Encounters:  07/01/22 163 lb (73.9 kg)  06/06/22 154 lb 5.2 oz (70 kg)  03/24/22 153 lb (69.4 kg)    Physical Exam Vitals reviewed.  Constitutional:      General: She is not in acute distress.    Appearance: She is not ill-appearing, toxic-appearing or diaphoretic.  HENT:     Mouth/Throat:     Mouth: Mucous membranes are moist.  Eyes:     General: No scleral icterus.    Conjunctiva/sclera: Conjunctivae normal.  Cardiovascular:     Rate and Rhythm: Normal rate and regular rhythm.     Heart sounds: Murmur heard.     Systolic murmur is present with a grade of 1/6.     No diastolic murmur is present.  Pulmonary:     Breath sounds: No stridor. No wheezing, rhonchi or rales.  Abdominal:     General: Abdomen is flat.     Palpations: There is no mass.     Tenderness: There is no abdominal tenderness. There is no guarding.     Hernia: No hernia is present.  Musculoskeletal:        General: Normal range of motion.     Cervical back: Neck supple.     Right lower leg: Edema (trace pitting) present.     Left lower leg: Edema (trace pitting) present.  Lymphadenopathy:     Cervical: No cervical adenopathy.  Neurological:     General: No focal deficit present.     Mental Status: She is alert.  Psychiatric:        Mood and Affect: Mood normal.     Lab Results  Component Value Date   WBC 6.2 07/01/2022   HGB 11.2 (L) 07/01/2022   HCT 34.0 (L) 07/01/2022   PLT 232.0 07/01/2022   GLUCOSE 116 (H) 06/28/2022   CHOL 129 03/19/2022   TRIG 40 03/19/2022   HDL 47 03/19/2022   LDLCALC 74 03/19/2022   ALT 12 03/18/2022   AST 16 03/18/2022   NA 136 06/28/2022   K 4.1 06/28/2022   CL 99  06/28/2022   CREATININE 1.81 (H) 06/28/2022   BUN 38 (H) 06/28/2022   CO2 21 (L) 06/28/2022   TSH 2.61 02/26/2022   INR 2.1 (H) 06/07/2022   HGBA1C 5.3 03/18/2022   MICROALBUR 63.9 (H) 04/20/2018    No results found.   Assessment & Plan:   Shelisa was seen today for cough and  urinary tract infection.  Diagnoses and all orders for this visit:  Chronic diastolic heart failure ([place])- She has a normal volume status. -     Brain natriuretic peptide; Future -     Troponin I (High Sensitivity); Future -     Troponin I (High Sensitivity) -     Brain natriuretic peptide  Stage 3b chronic kidney disease (CKD) ([place])- Her renal function is stable. -     Urinalysis, Routine w reflex microscopic; Future -     CBC with Differential/Platelet; Future -     CBC with Differential/Platelet -     Urinalysis, Routine w reflex microscopic  Cough productive of purulent sputum- Chest x-ray is negative for mass or infiltrate. -     DG Chest 2 View; Future -     Discontinue: HYDROcodone bit-homatropine (HYCODAN) 5-1.5 MG/5ML syrup; Take 5 mLs by mouth every 8 (eight) hours as needed for up to 9 days for cough. -     promethazine-dextromethorphan (PROMETHAZINE-DM) 6.25-15 MG/5ML syrup; Take 5 mLs by mouth 4 (four) times daily as needed for up to 8 days for cough.  Acute cystitis without hematuria- Urine culture is negative. -     Urinalysis, Routine w reflex microscopic; Future -     CULTURE, URINE COMPREHENSIVE; Future -     CULTURE, URINE COMPREHENSIVE -     Urinalysis, Routine w reflex microscopic  LRTI (lower respiratory tract infection) -     amoxicillin-clavulanate (AUGMENTIN) 875-125 MG tablet; Take 1 tablet by mouth 2 (two) times daily for 7 days. -     Discontinue: HYDROcodone bit-homatropine (HYCODAN) 5-1.5 MG/5ML syrup; Take 5 mLs by mouth every 8 (eight) hours as needed for up to 9 days for cough. -     promethazine-dextromethorphan (PROMETHAZINE-DM) 6.25-15 MG/5ML syrup; Take 5 mLs by  mouth 4 (four) times daily as needed for up to 8 days for cough.   I have discontinued Devota L. Kobler's cephALEXin and HYDROcodone bit-homatropine. I am also having her start on amoxicillin-clavulanate and promethazine-dextromethorphan. Additionally, I am having her maintain her PRESCRIPTION MEDICATION, modafinil, ipratropium, Symbicort, ferrous sulfate, Betamethasone Valerate, amoxicillin, Propylene Glycol (SYSTANE BALANCE OP), warfarin, Pitavastatin Calcium, warfarin, torsemide, metoprolol tartrate, lamoTRIgine, buPROPion, ARIPiprazole, Vilazodone HCl, allopurinol, dronabinol, famotidine, and benzonatate.  Meds ordered this encounter  Medications   amoxicillin-clavulanate (AUGMENTIN) 875-125 MG tablet    Sig: Take 1 tablet by mouth 2 (two) times daily for 7 days.    Dispense:  14 tablet    Refill:  0   DISCONTD: HYDROcodone bit-homatropine (HYCODAN) 5-1.5 MG/5ML syrup    Sig: Take 5 mLs by mouth every 8 (eight) hours as needed for up to 9 days for cough.    Dispense:  120 mL    Refill:  0   promethazine-dextromethorphan (PROMETHAZINE-DM) 6.25-15 MG/5ML syrup    Sig: Take 5 mLs by mouth 4 (four) times daily as needed for up to 8 days for cough.    Dispense:  118 mL    Refill:  0     Follow-up: No follow-ups on file.  Scarlette Calico, MD

## 2022-07-01 NOTE — Patient Instructions (Signed)

## 2022-07-02 ENCOUNTER — Ambulatory Visit
Admission: RE | Admit: 2022-07-02 | Discharge: 2022-07-02 | Disposition: A | Discharge status: Home / Self Care | Payer: Medicare PPO | Source: Ambulatory Visit

## 2022-07-02 DIAGNOSIS — Z1231 Encounter for screening mammogram for malignant neoplasm of breast: Secondary | ICD-10-CM | POA: Diagnosis not present

## 2022-07-02 LAB — CBC WITH DIFFERENTIAL/PLATELET
Basophils Absolute: 0 10*3/uL (ref 0.0–0.1)
Basophils Relative: 0.6 % (ref 0.0–3.0)
Eosinophils Absolute: 0.3 10*3/uL (ref 0.0–0.7)
Eosinophils Relative: 5.2 % — ABNORMAL HIGH (ref 0.0–5.0)
HCT: 34 % — ABNORMAL LOW (ref 36.0–46.0)
Hemoglobin: 11.2 g/dL — ABNORMAL LOW (ref 12.0–15.0)
Lymphocytes Relative: 9 % — ABNORMAL LOW (ref 12.0–46.0)
Lymphs Abs: 0.6 10*3/uL — ABNORMAL LOW (ref 0.7–4.0)
MCHC: 32.8 g/dL (ref 30.0–36.0)
MCV: 92.3 fl (ref 78.0–100.0)
Monocytes Absolute: 1 10*3/uL (ref 0.1–1.0)
Monocytes Relative: 15.9 % — ABNORMAL HIGH (ref 3.0–12.0)
Neutro Abs: 4.3 10*3/uL (ref 1.4–7.7)
Neutrophils Relative %: 69.3 % (ref 43.0–77.0)
Platelets: 232 10*3/uL (ref 150.0–400.0)
RBC: 3.69 Mil/uL — ABNORMAL LOW (ref 3.87–5.11)
RDW: 14.5 % (ref 11.5–15.5)
WBC: 6.2 10*3/uL (ref 4.0–10.5)

## 2022-07-02 NOTE — Telephone Encounter (Signed)
Chart Review

## 2022-07-04 LAB — CULTURE, URINE COMPREHENSIVE: RESULT:: NO GROWTH

## 2022-07-08 ENCOUNTER — Ambulatory Visit (INDEPENDENT_AMBULATORY_CARE_PROVIDER_SITE_OTHER): Payer: Medicare PPO

## 2022-07-08 DIAGNOSIS — I442 Atrioventricular block, complete: Secondary | ICD-10-CM | POA: Diagnosis not present

## 2022-07-08 LAB — CUP PACEART REMOTE DEVICE CHECK
Battery Impedance: 2103 Ohm
Battery Remaining Longevity: 31 mo
Battery Voltage: 2.74 V
Brady Statistic AP VP Percent: 72 %
Brady Statistic AP VS Percent: 0 %
Brady Statistic AS VP Percent: 27 %
Brady Statistic AS VS Percent: 0 %
Date Time Interrogation Session: 20231205095338
Implantable Lead Connection Status: 753985
Implantable Lead Connection Status: 753985
Implantable Lead Implant Date: 20131210
Implantable Lead Implant Date: 20131210
Implantable Lead Location: 753858
Implantable Lead Location: 753859
Implantable Lead Model: 4296
Implantable Lead Model: 5076
Implantable Pulse Generator Implant Date: 20131210
Lead Channel Impedance Value: 1035 Ohm
Lead Channel Impedance Value: 430 Ohm
Lead Channel Pacing Threshold Amplitude: 0.875 V
Lead Channel Pacing Threshold Amplitude: 1.125 V
Lead Channel Pacing Threshold Pulse Width: 0.4 ms
Lead Channel Pacing Threshold Pulse Width: 0.4 ms
Lead Channel Setting Pacing Amplitude: 2 V
Lead Channel Setting Pacing Amplitude: 2.5 V
Lead Channel Setting Pacing Pulse Width: 0.4 ms
Lead Channel Setting Sensing Sensitivity: 8 mV
Zone Setting Status: 755011
Zone Setting Status: 755011

## 2022-07-18 ENCOUNTER — Ambulatory Visit: Payer: Medicare PPO

## 2022-07-18 DIAGNOSIS — Z7901 Long term (current) use of anticoagulants: Secondary | ICD-10-CM | POA: Diagnosis not present

## 2022-07-18 LAB — POCT INR: INR: 3.1 — AB (ref 2.0–3.0)

## 2022-07-18 NOTE — Patient Instructions (Addendum)
Pre visit review using our clinic review tool, if applicable. No additional management support is needed unless otherwise documented below in the visit note.  Decrease dose today to take 1/2 tablet and then continue to take warfarin one tablet daily and recheck in 3 weeks.

## 2022-07-18 NOTE — Progress Notes (Signed)
Pt was in ER 3 times since last coumadin clinic apt. Twice for oral bleeding which was caused by trauma from eating chips. Third time for acute cough.  Medical treatment/procedure(s) were performed by non-physician practitioner and as supervising physician I was immediately available for consultation/collaboration.  I agree with above. Hoyt Koch, MD

## 2022-08-05 NOTE — Progress Notes (Signed)
Remote pacemaker transmission.   

## 2022-08-08 ENCOUNTER — Ambulatory Visit: Payer: Medicare PPO

## 2022-08-08 ENCOUNTER — Telehealth: Payer: Self-pay | Admitting: Cardiovascular Disease

## 2022-08-08 DIAGNOSIS — F332 Major depressive disorder, recurrent severe without psychotic features: Secondary | ICD-10-CM

## 2022-08-08 DIAGNOSIS — Z7901 Long term (current) use of anticoagulants: Secondary | ICD-10-CM

## 2022-08-08 LAB — POCT INR: INR: 3.4 — AB (ref 2.0–3.0)

## 2022-08-08 MED ORDER — ARIPIPRAZOLE 2 MG PO TABS: 2.0000 mg | ORAL_TABLET | Freq: Every day | ORAL | 1 refills | Status: DC

## 2022-08-08 MED ORDER — WARFARIN SODIUM 2.5 MG PO TABS: ORAL_TABLET | ORAL | 1 refills | Status: DC

## 2022-08-08 NOTE — Telephone Encounter (Signed)
  Pt's daughter calling to schedule pt's appt and echo. Pt is scheduled with Dr. Johnsie Cancel but there's no echo order on file yet

## 2022-08-08 NOTE — Patient Instructions (Addendum)
Pre visit review using our clinic review tool, if applicable. No additional management support is needed unless otherwise documented below in the visit note.  Hold dose today and then change weekly dose to take 1 tablet daily except take 1/2 tablet on Wednesdays recheck in 3 weeks.

## 2022-08-08 NOTE — Progress Notes (Addendum)
Hold dose today and then change weekly dose to take 1 tablet daily except take 1/2 tablet on Wednesdays recheck in 3 weeks.   Pt's son reports pt needs refill of warfarin and Abilify.  Sent in refill of warfarin and abilify to preferred pharmacy.

## 2022-08-09 ENCOUNTER — Other Ambulatory Visit: Payer: Self-pay | Admitting: Internal Medicine

## 2022-08-09 DIAGNOSIS — F418 Other specified anxiety disorders: Secondary | ICD-10-CM

## 2022-08-09 DIAGNOSIS — F3341 Major depressive disorder, recurrent, in partial remission: Secondary | ICD-10-CM

## 2022-08-13 NOTE — Telephone Encounter (Signed)
Per Dr. Johnsie Cancel, reviewed with [name]) that patient does not need to repeat Echo before her appt in February.

## 2022-08-21 DIAGNOSIS — N184 Chronic kidney disease, stage 4 (severe): Secondary | ICD-10-CM | POA: Diagnosis not present

## 2022-08-21 LAB — IRON,TIBC AND FERRITIN PANEL
%SAT: 28
Ferritin: 92
Iron: 86
TIBC: 302

## 2022-08-21 LAB — VITAMIN D 25 HYDROXY (VIT D DEFICIENCY, FRACTURES): Vit D, 25-Hydroxy: 25

## 2022-08-21 LAB — CBC AND DIFFERENTIAL
HCT: 33 — AB (ref 36–46)
Hemoglobin: 10.8 — AB (ref 12.0–16.0)
Platelets: 149 10*3/uL — AB (ref 150–400)
WBC: 5

## 2022-08-21 LAB — BASIC METABOLIC PANEL
BUN: 25 — AB (ref 4–21)
CO2: 25 — AB (ref 13–22)
Chloride: 102 (ref 99–108)
Creatinine: 1.8 — AB (ref 0.5–1.1)
Glucose: 98
Potassium: 4.7 mEq/L (ref 3.5–5.1)
Sodium: 141 (ref 137–147)

## 2022-08-21 LAB — COMPREHENSIVE METABOLIC PANEL
Albumin: 4.1 (ref 3.5–5.0)
Calcium: 10.2 (ref 8.7–10.7)
eGFR: 28

## 2022-08-27 ENCOUNTER — Encounter: Payer: Self-pay | Admitting: Internal Medicine

## 2022-08-27 DIAGNOSIS — I4891 Unspecified atrial fibrillation: Secondary | ICD-10-CM | POA: Diagnosis not present

## 2022-08-27 DIAGNOSIS — D631 Anemia in chronic kidney disease: Secondary | ICD-10-CM | POA: Diagnosis not present

## 2022-08-27 DIAGNOSIS — N2581 Secondary hyperparathyroidism of renal origin: Secondary | ICD-10-CM | POA: Diagnosis not present

## 2022-08-27 DIAGNOSIS — I5189 Other ill-defined heart diseases: Secondary | ICD-10-CM | POA: Diagnosis not present

## 2022-08-27 DIAGNOSIS — N184 Chronic kidney disease, stage 4 (severe): Secondary | ICD-10-CM | POA: Diagnosis not present

## 2022-08-27 DIAGNOSIS — I129 Hypertensive chronic kidney disease with stage 1 through stage 4 chronic kidney disease, or unspecified chronic kidney disease: Secondary | ICD-10-CM | POA: Diagnosis not present

## 2022-09-02 ENCOUNTER — Ambulatory Visit: Payer: Medicare PPO | Admitting: Internal Medicine

## 2022-09-02 ENCOUNTER — Ambulatory Visit: Payer: Medicare PPO

## 2022-09-02 ENCOUNTER — Encounter: Payer: Self-pay | Admitting: Internal Medicine

## 2022-09-02 VITALS — BP 136/76 | HR 87 | Temp 97.8°F | Resp 16 | Ht 62.0 in | Wt 157.0 lb

## 2022-09-02 DIAGNOSIS — I480 Paroxysmal atrial fibrillation: Secondary | ICD-10-CM

## 2022-09-02 DIAGNOSIS — N1832 Chronic kidney disease, stage 3b: Secondary | ICD-10-CM | POA: Diagnosis not present

## 2022-09-02 DIAGNOSIS — F02B11 Dementia in other diseases classified elsewhere, moderate, with agitation: Secondary | ICD-10-CM | POA: Diagnosis not present

## 2022-09-02 DIAGNOSIS — K5904 Chronic idiopathic constipation: Secondary | ICD-10-CM | POA: Insufficient documentation

## 2022-09-02 DIAGNOSIS — Z7901 Long term (current) use of anticoagulants: Secondary | ICD-10-CM

## 2022-09-02 DIAGNOSIS — F0390 Unspecified dementia without behavioral disturbance: Secondary | ICD-10-CM | POA: Insufficient documentation

## 2022-09-02 LAB — POCT INR: INR: 2.6 (ref 2.0–3.0)

## 2022-09-02 MED ORDER — BREXPIPRAZOLE 0.25 MG PO TABS: 0.2500 mg | ORAL_TABLET | Freq: Every day | ORAL | 0 refills | Status: DC

## 2022-09-02 MED ORDER — TRULANCE 3 MG PO TABS: 1.0000 | ORAL_TABLET | Freq: Every day | ORAL | 1 refills | Status: DC

## 2022-09-02 NOTE — Patient Instructions (Addendum)
Pre visit review using our clinic review tool, if applicable. No additional management support is needed unless otherwise documented below in the visit note.  Continue 1 tablet daily except take 1/2 tablet on Wednesdays recheck in 4 weeks.

## 2022-09-02 NOTE — Progress Notes (Signed)
Subjective:  Patient ID: Holly Simmons, female    DOB: [birth year]  Age: 82 y.o. MRN: [id number]  CC: Anemia   HPI Holly Simmons presents for f/up -   Her daughter complains that Holly Simmons is having outbursts of anger and agitation.  She also tells me that she is having a bowel movement about once a week and after not having a bowel movement for several days she becomes confused and bloated.  Outpatient Medications Prior to Visit  Medication Sig Dispense Refill   allopurinol (ZYLOPRIM) 100 MG tablet Take 1 tablet by mouth daily. 90 tablet 1   amoxicillin (AMOXIL) 500 MG capsule TAKE 4 CAPSULES 1 HOUR PRIOR TO PROCEDURE 30 capsule 0   benzonatate (TESSALON) 100 MG capsule Take 1 capsule (100 mg total) by mouth every 8 (eight) hours. 21 capsule 0   Betamethasone Valerate 0.12 % foam APPLY TO SCALP AS DIRECTED. (Patient taking differently: 1 application  once a week.) 100 g 2   buPROPion (WELLBUTRIN XL) 300 MG 24 hr tablet TAKE ONE TABLET BY MOUTH ONCE DAILY 90 tablet 0   dronabinol (MARINOL) 5 MG capsule Take 1 capsule by mouth two times daily before lunch and supper. 180 capsule 1   famotidine (PEPCID) 40 MG tablet Take 1 tablet by mouth daily. 90 tablet 1   ferrous sulfate 325 (65 FE) MG tablet Take 1 tablet (325 mg total) by mouth 2 (two) times daily with a meal. (Patient taking differently: Take 325-650 mg by mouth daily with breakfast.) 180 tablet 1   ipratropium (ATROVENT) 0.03 % nasal spray USE 2 SPRAYS IN EACH NOSTRIL 3 TIMES A DAY AS NEEDED. (Patient taking differently: Place 2 sprays into both nostrils daily as needed (nose bleed).) 30 mL 0   lamoTRIgine (LAMICTAL) 25 MG tablet TAKE ONE TABLET BY MOUTH ONCE DAILY 90 tablet 0   metoprolol tartrate (LOPRESSOR) 25 MG tablet Take 1 tablet (25 mg total) by mouth 2 (two) times daily. 180 tablet 1   modafinil (PROVIGIL) 100 MG tablet Take 1 tablet (100 mg total) by mouth daily. (Patient taking differently: Take 100 mg by mouth  daily as needed (sleep disorder).) 30 tablet 5   Pitavastatin Calcium 2 MG TABS Take 1 tablet (2 mg total) by mouth daily. 90 tablet 1   PRESCRIPTION MEDICATION 1 Dose by Other route at bedtime as needed (sleep). CPAP     Propylene Glycol (SYSTANE BALANCE OP) Place 1 drop into both eyes 4 (four) times daily as needed (dry eyes).     SYMBICORT 80-4.5 MCG/ACT inhaler USE 2 PUFFS TWICE A DAY. (Patient taking differently: 2 puffs 2 (two) times daily.) 30.6 g 5   torsemide (DEMADEX) 20 MG tablet Take 1 tablet (20 mg total) by mouth daily. 90 tablet 1   Vilazodone HCl (VIIBRYD) 40 MG TABS Take 1 tablet by mouth daily. 90 tablet 1   warfarin (COUMADIN) 1 MG tablet Take 1 tablet (1 mg total) by mouth daily. TAKE 1 1/2 TABLET EVERY WEDNESDAY OR AS DIRECTED BY ANTICOAGULATION LCINIC 20 tablet 0   warfarin (COUMADIN) 2.5 MG tablet TAKE 1 TABLET BY MOUTH DAILY EXCEPT TAKE 1/2 TABLET ON WEDNESDAYS OR AS DIRECTED BY ANTICOAGULATION CLINIC 180 tablet 1   ARIPiprazole (ABILIFY) 2 MG tablet Take 1 tablet (2 mg total) by mouth daily. 90 tablet 1   No facility-administered medications prior to visit.    ROS Review of Systems  Constitutional:  Positive for fatigue. Negative for appetite change, diaphoresis and unexpected  weight change.  HENT: Negative.    Eyes: Negative.   Respiratory:  Negative for cough, chest tightness, shortness of breath and wheezing.   Cardiovascular:  Negative for chest pain, palpitations and leg swelling.  Gastrointestinal:  Positive for constipation. Negative for abdominal pain, diarrhea, nausea and vomiting.  Endocrine: Negative.   Genitourinary: Negative.  Negative for difficulty urinating.  Musculoskeletal: Negative.   Skin: Negative.  Negative for color change and pallor.  Neurological: Negative.  Negative for dizziness and weakness.  Psychiatric/Behavioral:  Positive for agitation, behavioral problems, confusion, decreased concentration, dysphoric mood and sleep disturbance.  Negative for suicidal ideas. The patient is nervous/anxious.     Objective:  BP 136/76 (BP Location: Left Arm, Patient Position: Sitting, Cuff Size: Large)   Pulse 87   Temp 97.8 F (36.6 C) (Oral)   Resp 16   Ht '5\' 2"'$  (1.575 m)   Wt 157 lb (71.2 kg)   SpO2 96%   BMI 28.72 kg/m   BP Readings from Last 3 Encounters:  09/02/22 136/76  07/01/22 122/62  06/28/22 (!) 132/59    Wt Readings from Last 3 Encounters:  09/02/22 157 lb (71.2 kg)  07/01/22 163 lb (73.9 kg)  06/06/22 154 lb 5.2 oz (70 kg)    Physical Exam Vitals reviewed.  Constitutional:      Appearance: She is ill-appearing.  HENT:     Mouth/Throat:     Mouth: Mucous membranes are moist.  Eyes:     General: No scleral icterus.    Conjunctiva/sclera: Conjunctivae normal.  Cardiovascular:     Rate and Rhythm: Normal rate and regular rhythm.     Heart sounds: Murmur heard.     Systolic murmur is present with a grade of 2/6.     Gallop present.  Pulmonary:     Effort: Pulmonary effort is normal.     Breath sounds: No stridor. No wheezing, rhonchi or rales.  Abdominal:     General: Abdomen is flat.     Palpations: There is no mass.     Tenderness: There is no abdominal tenderness. There is no guarding.     Hernia: No hernia is present.  Musculoskeletal:     Cervical back: Neck supple.     Right lower leg: No edema.     Left lower leg: No edema.  Lymphadenopathy:     Cervical: No cervical adenopathy.  Skin:    General: Skin is warm and dry.  Neurological:     General: No focal deficit present.     Mental Status: She is alert. Mental status is at baseline.  Psychiatric:        Attention and Perception: She is inattentive.        Mood and Affect: Mood is anxious and depressed. Affect is flat.        Speech: She is communicative. Speech is delayed.        Behavior: Behavior normal. Behavior is not agitated, slowed, aggressive, withdrawn or hyperactive. Behavior is cooperative.        Thought Content:  Thought content normal.        Cognition and Memory: Cognition is impaired. Memory is impaired.     Lab Results  Component Value Date   WBC 5.0 08/21/2022   HGB 10.8 (A) 08/21/2022   HCT 33 (A) 08/21/2022   PLT 149 (A) 08/21/2022   GLUCOSE 116 (H) 06/28/2022   CHOL 129 03/19/2022   TRIG 40 03/19/2022   HDL 47 03/19/2022   [place]  74 03/19/2022   ALT 12 03/18/2022   AST 16 03/18/2022   NA 141 08/21/2022   K 4.7 08/21/2022   CL 102 08/21/2022   CREATININE 1.8 (A) 08/21/2022   BUN 25 (A) 08/21/2022   CO2 25 (A) 08/21/2022   TSH 2.61 02/26/2022   INR 2.6 09/02/2022   HGBA1C 5.3 03/18/2022   MICROALBUR 63.9 (H) 04/20/2018    MM 3D SCREEN BREAST BILATERAL  Result Date: 07/03/2022 CLINICAL DATA:  Screening. EXAM: DIGITAL SCREENING BILATERAL MAMMOGRAM WITH TOMOSYNTHESIS AND CAD TECHNIQUE: Bilateral screening digital craniocaudal and mediolateral oblique mammograms were obtained. Bilateral screening digital breast tomosynthesis was performed. The images were evaluated with computer-aided detection. COMPARISON:  Previous exam(s). ACR Breast Density Category b: There are scattered areas of fibroglandular density. FINDINGS: There are no findings suspicious for malignancy. IMPRESSION: No mammographic evidence of malignancy. A result letter of this screening mammogram will be mailed directly to the patient. RECOMMENDATION: Screening mammogram in one year. (Code:SM-B-01Y) BI-RADS CATEGORY  1: Negative. Electronically Signed   By: Abelardo Diesel M.D.   On: 07/03/2022 14:19    Assessment & Plan:   Giulia was seen today for anemia.  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 -     brexpiprazole (REXULTI) 0.25 MG TABS tablet; Take 1 tablet (0.25 mg total) by mouth daily.  Chronic idiopathic constipation -     Plecanatide (TRULANCE) 3 MG TABS; Take 1 tablet (3 mg total)  by mouth daily.  PAF (paroxysmal atrial fibrillation) ([place])- She has good rate and rhythm control.  Stage 3b chronic kidney disease (CKD) ([place])- Her renal function is stable.   I have discontinued [place] L. [place]o's ARIPiprazole. I am also having her start on brexpiprazole and Trulance. Additionally, I am having her maintain her PRESCRIPTION MEDICATION, modafinil, ipratropium, Symbicort, ferrous sulfate, Betamethasone Valerate, amoxicillin, Propylene Glycol (SYSTANE BALANCE OP), warfarin, Pitavastatin Calcium, torsemide, metoprolol tartrate, Vilazodone HCl, allopurinol, dronabinol, famotidine, benzonatate, warfarin, lamoTRIgine, and buPROPion.  Meds ordered this encounter  Medications   brexpiprazole (REXULTI) 0.25 MG TABS tablet    Sig: Take 1 tablet (0.25 mg total) by mouth daily.    Dispense:  30 tablet    Refill:  0   Plecanatide (TRULANCE) 3 MG TABS    Sig: Take 1 tablet (3 mg total) by mouth daily.    Dispense:  90 tablet    Refill:  1     Follow-up: No follow-ups on file.  Scarlette Calico, MD

## 2022-09-02 NOTE — Progress Notes (Signed)
Continue 1 tablet daily except take 1/2 tablet on Wednesdays recheck in 4 weeks.

## 2022-09-02 NOTE — Patient Instructions (Signed)

## 2022-09-03 ENCOUNTER — Telehealth: Payer: Self-pay

## 2022-09-03 NOTE — Telephone Encounter (Signed)
Key: [id number]

## 2022-09-06 ENCOUNTER — Encounter: Payer: Self-pay | Admitting: Internal Medicine

## 2022-09-08 NOTE — Telephone Encounter (Signed)
Appeal has been approved 08/04/2022  - 08/04/2023

## 2022-09-09 ENCOUNTER — Encounter: Payer: Self-pay | Admitting: Internal Medicine

## 2022-09-09 NOTE — Telephone Encounter (Signed)
Prolia VOB initiated via [email or web address]  Last Prolia inj 04/04/22 Next Prolia inj due 10/04/22

## 2022-09-10 ENCOUNTER — Other Ambulatory Visit (HOSPITAL_BASED_OUTPATIENT_CLINIC_OR_DEPARTMENT_OTHER): Payer: Self-pay

## 2022-09-10 MED FILL — Dronabinol Cap 5 MG: ORAL | 30 days supply | Qty: 60 | Fill #0 | Status: AC

## 2022-09-11 ENCOUNTER — Other Ambulatory Visit (HOSPITAL_BASED_OUTPATIENT_CLINIC_OR_DEPARTMENT_OTHER): Payer: Self-pay

## 2022-09-11 MED FILL — Dronabinol Cap 5 MG: ORAL | 60 days supply | Qty: 120 | Fill #0 | Status: AC

## 2022-09-12 ENCOUNTER — Other Ambulatory Visit (INDEPENDENT_AMBULATORY_CARE_PROVIDER_SITE_OTHER): Payer: Medicare PPO

## 2022-09-12 ENCOUNTER — Other Ambulatory Visit: Payer: Self-pay | Admitting: Internal Medicine

## 2022-09-12 ENCOUNTER — Other Ambulatory Visit: Payer: Medicare PPO

## 2022-09-12 DIAGNOSIS — D696 Thrombocytopenia, unspecified: Secondary | ICD-10-CM

## 2022-09-12 DIAGNOSIS — Z5181 Encounter for therapeutic drug level monitoring: Secondary | ICD-10-CM

## 2022-09-12 DIAGNOSIS — D500 Iron deficiency anemia secondary to blood loss (chronic): Secondary | ICD-10-CM

## 2022-09-12 LAB — CBC WITH DIFFERENTIAL/PLATELET
Basophils Absolute: 0 10*3/uL (ref 0.0–0.1)
Basophils Relative: 0.5 % (ref 0.0–3.0)
Eosinophils Absolute: 0.3 10*3/uL (ref 0.0–0.7)
Eosinophils Relative: 5.2 % — ABNORMAL HIGH (ref 0.0–5.0)
HCT: 36.5 % (ref 36.0–46.0)
Hemoglobin: 11.7 g/dL — ABNORMAL LOW (ref 12.0–15.0)
Lymphocytes Relative: 12 % (ref 12.0–46.0)
Lymphs Abs: 0.7 10*3/uL (ref 0.7–4.0)
MCHC: 32.1 g/dL (ref 30.0–36.0)
MCV: 94.5 fl (ref 78.0–100.0)
Monocytes Absolute: 0.6 10*3/uL (ref 0.1–1.0)
Monocytes Relative: 11.1 % (ref 3.0–12.0)
Neutro Abs: 3.9 10*3/uL (ref 1.4–7.7)
Neutrophils Relative %: 71.2 % (ref 43.0–77.0)
Platelets: 136 10*3/uL — ABNORMAL LOW (ref 150.0–400.0)
RBC: 3.86 Mil/uL — ABNORMAL LOW (ref 3.87–5.11)
RDW: 15.4 % (ref 11.5–15.5)
WBC: 5.5 10*3/uL (ref 4.0–10.5)

## 2022-09-12 LAB — PROTIME-INR
INR: 2.2 — ABNORMAL HIGH
Prothrombin Time: 21.8 s — ABNORMAL HIGH (ref 9.0–11.5)

## 2022-09-17 ENCOUNTER — Telehealth: Payer: Self-pay | Admitting: Pharmacy Technician

## 2022-09-17 NOTE — Telephone Encounter (Signed)
Received a fax regarding Prior Authorization from [facility] for Holly Simmons. Authorization has been DENIED because patient must have had a trial or cannot take Linzess (linaclotide) and Amitiza (lubiprostone).  Phone#[phone]

## 2022-09-18 NOTE — Progress Notes (Signed)
82 y.o. history of HOCM. Multiple complications with surgery at [facility] 06/23/11 by Dr Evelina Dun. 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 They had been married 35 years   She needs to see EP in person Has not had visit since Dr Rayann Heman retired and she has 2 pacer systems  Seems to have had rapidly progressive dementia since I last saw her CT head negative 03/19/22  Needs neurology referral Daughter lives with her    ROS: Denies fever, malais, weight loss, blurry vision, decreased visual acuity, cough, sputum, SOB, hemoptysis, pleuritic pain, palpitaitons, heartburn, abdominal pain, melena, lower extremity edema, claudication, or rash.  All other systems reviewed and negative  General: BP 118/62   Pulse 71   Ht '5\' 3"'$  (1.6 m)   Wt 162 lb (73.5 kg)   SpO2 91%   BMI 28.70 kg/m   Affect appropriate Elderly female  HEENT: normal Neck supple with no adenopathy JVP normal no bruits no thyromegaly Lungs clear with no wheezing and good diaphragmatic motion Heart:  S1/S2 no murmur, no rub, gallop or click PMI normal PPM epicardial generator abdomen and PPM CS under left clavicle Abdomen: benighn, BS positve, no tenderness, no AAA no bruit.  No HSM or  HJR Distal pulses intact with no bruits Some varicosities in LE;s  Speech and facial expression now abnormal Clenches hands no focal abnormality Skin warm and dry No muscular weakness   Current Outpatient Medications  Medication Sig Dispense Refill   allopurinol (ZYLOPRIM) 100 MG tablet Take 1 tablet by mouth daily. 90 tablet 1   amoxicillin (AMOXIL) 500 MG capsule TAKE 4 CAPSULES 1 HOUR PRIOR TO PROCEDURE 30 capsule 0   Betamethasone Valerate 0.12 % foam APPLY TO SCALP AS DIRECTED. (Patient taking differently: 1 application  once a week.) 100 g 2   brexpiprazole (REXULTI) 0.25 MG TABS tablet Take 1 tablet (0.25 mg total) by mouth daily. 30 tablet 0   buPROPion (WELLBUTRIN XL) 300 MG 24 hr tablet TAKE ONE TABLET BY MOUTH ONCE DAILY 90 tablet 0   dronabinol (MARINOL) 5 MG capsule Take 1 capsule by mouth two times daily before lunch and supper. 180 capsule 1   famotidine (PEPCID) 40 MG tablet Take 1 tablet by mouth daily. 90 tablet 1   ferrous sulfate 325 (65 FE) MG tablet Take 1 tablet (325 mg total) by mouth 2 (two) times daily with a meal. (Patient taking differently: Take 325-650 mg by mouth daily with breakfast.) 180 tablet 1   ipratropium (ATROVENT) 0.03 % nasal spray USE 2 SPRAYS IN EACH NOSTRIL 3 TIMES A DAY AS NEEDED. (Patient taking differently: Place 2 sprays into both nostrils daily as needed (nose bleed).) 30 mL  0   lamoTRIgine (LAMICTAL) 25 MG tablet TAKE ONE TABLET BY MOUTH ONCE DAILY 90 tablet 0   metoprolol tartrate (LOPRESSOR) 25 MG tablet Take 1 tablet (25 mg total) by mouth 2 (two) times daily. 180 tablet 1   modafinil (PROVIGIL) 100 MG tablet Take 1 tablet (100 mg total) by mouth daily. (Patient taking differently: Take 100 mg by mouth daily as needed (sleep disorder).) 30 tablet 5   Pitavastatin Calcium 2 MG TABS Take 1 tablet (2 mg total) by mouth daily. 90 tablet 1   Plecanatide (TRULANCE) 3 MG TABS Take 1 tablet (3 mg total) by mouth daily. 90 tablet 1   PRESCRIPTION  MEDICATION 1 Dose by Other route at bedtime as needed (sleep). CPAP     Propylene Glycol (SYSTANE BALANCE OP) Place 1 drop into both eyes 4 (four) times daily as needed (dry eyes).     SYMBICORT 80-4.5 MCG/ACT inhaler USE 2 PUFFS TWICE A DAY. (Patient taking differently: 2 puffs 2 (two) times daily.) 30.6 g 5   torsemide (DEMADEX) 20 MG tablet Take 1 tablet (20 mg total) by mouth daily. 90 tablet 1   Vilazodone HCl (VIIBRYD) 40 MG TABS Take 1 tablet by mouth daily. 90 tablet 1   warfarin (COUMADIN) 1 MG tablet Take 1 tablet (1 mg total) by mouth daily. TAKE 1 1/2 TABLET EVERY WEDNESDAY OR AS DIRECTED BY ANTICOAGULATION LCINIC 20 tablet 0   warfarin (COUMADIN) 2.5 MG tablet TAKE 1 TABLET BY MOUTH DAILY EXCEPT TAKE 1/2 TABLET ON WEDNESDAYS OR AS DIRECTED BY ANTICOAGULATION CLINIC 180 tablet 1   No current facility-administered medications for this visit.    Allergies  Petrolatum-zinc oxide; Sulfa antibiotics; Sulfonamide derivatives; Tetanus toxoid; Tetanus toxoids; Other; Nsaids; and Tetanus toxoid, adsorbed  Electrocardiogram:  AV pacing 90   Assessment and Plan  HOCM: post myectomy no residual gradients  VSD now sealed with no VSD murmur on exam  Pacer  Thresholds ok f/u [place] ? Changed to VVIR mode CS pacer Is under left clavicle and she has a 2nd abdominal device programmed as VVI backup Normal PACEART 07/08/22  TV replacement  SBE creap in diastolic gradient to mean 7.7 mmhg mild residual TR  Anticoagulation  Chronic afib INR Rx on coumadin no bleeding issues INR 2.2 on 09/12/22  A/CRF:  Baseline Cr around 1.8 f/u [facility]  Thrombocytopenia:  PLT 136 09/12/22  stable will need close watching on coumadin  Aortic Stenosis :  mild mean gradient 9 mmHg with mild AR  Dementia:  to me marked change in speech and posturing refer to [facility] for further w/u  F/U with EP check pacer in person Refer to Neuro for rapidly progressive dementia  F/U with me in a year     [employer]

## 2022-09-30 ENCOUNTER — Ambulatory Visit (INDEPENDENT_AMBULATORY_CARE_PROVIDER_SITE_OTHER): Payer: Medicare PPO

## 2022-09-30 DIAGNOSIS — Z7901 Long term (current) use of anticoagulants: Secondary | ICD-10-CM

## 2022-09-30 LAB — POCT INR: INR: 2.6 (ref 2.0–3.0)

## 2022-09-30 NOTE — Patient Instructions (Addendum)
Pre visit review using our clinic review tool, if applicable. No additional management support is needed unless otherwise documented below in the visit note.  Continue 1 tablet daily except take 1/2 tablet on Wednesdays recheck in 5 weeks.

## 2022-09-30 NOTE — Progress Notes (Signed)
Continue 1 tablet daily except take 1/2 tablet on Wednesdays recheck in 5 weeks.

## 2022-09-30 NOTE — Telephone Encounter (Signed)
Forwarding to Rx Prior Auth Team 

## 2022-10-01 ENCOUNTER — Encounter: Payer: Self-pay | Admitting: Cardiovascular Disease

## 2022-10-01 ENCOUNTER — Encounter: Payer: Self-pay | Admitting: Internal Medicine

## 2022-10-01 ENCOUNTER — Ambulatory Visit: Payer: Medicare PPO | Attending: Cardiovascular Disease | Admitting: Cardiovascular Disease

## 2022-10-01 VITALS — BP 118/62 | HR 71 | Ht 63.0 in | Wt 162.0 lb

## 2022-10-01 DIAGNOSIS — Z7901 Long term (current) use of anticoagulants: Secondary | ICD-10-CM

## 2022-10-01 DIAGNOSIS — Z5181 Encounter for therapeutic drug level monitoring: Secondary | ICD-10-CM | POA: Diagnosis not present

## 2022-10-01 DIAGNOSIS — Z950 Presence of cardiac pacemaker: Secondary | ICD-10-CM

## 2022-10-01 DIAGNOSIS — Q2542 Hypoplasia of aorta: Secondary | ICD-10-CM | POA: Diagnosis not present

## 2022-10-01 DIAGNOSIS — Z9889 Other specified postprocedural states: Secondary | ICD-10-CM

## 2022-10-01 DIAGNOSIS — I482 Chronic atrial fibrillation, unspecified: Secondary | ICD-10-CM | POA: Diagnosis not present

## 2022-10-01 DIAGNOSIS — I421 Obstructive hypertrophic cardiomyopathy: Secondary | ICD-10-CM | POA: Diagnosis not present

## 2022-10-01 DIAGNOSIS — Q210 Ventricular septal defect: Secondary | ICD-10-CM | POA: Diagnosis not present

## 2022-10-01 NOTE — Patient Instructions (Addendum)
Medication Instructions:  Your physician recommends that you continue on your current medications as directed. Please refer to the Current Medication list given to you today.  *If you need a refill on your cardiac medications before your next appointment, please call your pharmacy*  Lab Work: If you have labs (blood work) drawn today and your tests are completely normal, you will receive your results only by: [place] (if you have MyChart) OR A paper copy in the mail If you have any lab test that is abnormal or we need to change your treatment, we will call you to review the results.  Testing/Procedures: None ordered today.  Follow-Up: At [facility], you and your health needs are our priority.  As part of our continuing mission to provide you with exceptional heart care, we have created designated Provider Care Teams.  These Care Teams include your primary Cardiologist (physician) and Advanced Practice Providers (APPs -  Physician Assistants and Nurse Practitioners) who all work together to provide you with the care you need, when you need it.  We recommend signing up for the patient portal called "MyChart".  Sign up information is provided on this After Visit Summary.  MyChart is used to connect with patients for Virtual Visits (Telemedicine).  Patients are able to view lab/test results, encounter notes, upcoming appointments, etc.  Non-urgent messages can be sent to your provider as well.   To learn more about what you can do with MyChart, go to [email or web address].    Your next appointment:   1 year(s)  Provider:   Jenkins Rouge, MD     Your physician recommends that you schedule a follow-up appointment next available with EP doctor.

## 2022-10-06 ENCOUNTER — Ambulatory Visit: Payer: Medicare PPO | Admitting: Physician Assistant

## 2022-10-06 ENCOUNTER — Ambulatory Visit (INDEPENDENT_AMBULATORY_CARE_PROVIDER_SITE_OTHER): Payer: Medicare PPO

## 2022-10-06 ENCOUNTER — Other Ambulatory Visit: Payer: Self-pay | Admitting: Internal Medicine

## 2022-10-06 ENCOUNTER — Telehealth: Payer: Self-pay

## 2022-10-06 ENCOUNTER — Encounter: Payer: Self-pay | Admitting: Physician Assistant

## 2022-10-06 ENCOUNTER — Other Ambulatory Visit (HOSPITAL_COMMUNITY): Payer: Self-pay

## 2022-10-06 ENCOUNTER — Other Ambulatory Visit (INDEPENDENT_AMBULATORY_CARE_PROVIDER_SITE_OTHER): Payer: Medicare PPO

## 2022-10-06 ENCOUNTER — Ambulatory Visit: Payer: Medicare PPO

## 2022-10-06 VITALS — Ht 63.0 in | Wt 161.0 lb

## 2022-10-06 VITALS — BP 156/80 | HR 82 | Ht 63.0 in | Wt 161.0 lb

## 2022-10-06 DIAGNOSIS — R413 Other amnesia: Secondary | ICD-10-CM

## 2022-10-06 DIAGNOSIS — Z0000 Encounter for general adult medical examination without abnormal findings: Secondary | ICD-10-CM | POA: Diagnosis not present

## 2022-10-06 DIAGNOSIS — W44F3XA Food entering into or through a natural orifice, initial encounter: Secondary | ICD-10-CM | POA: Diagnosis not present

## 2022-10-06 DIAGNOSIS — E785 Hyperlipidemia, unspecified: Secondary | ICD-10-CM

## 2022-10-06 DIAGNOSIS — T17320A Food in larynx causing asphyxiation, initial encounter: Secondary | ICD-10-CM

## 2022-10-06 DIAGNOSIS — F03918 Unspecified dementia, unspecified severity, with other behavioral disturbance: Secondary | ICD-10-CM

## 2022-10-06 LAB — VITAMIN B12: Vitamin B-12: 303 pg/mL (ref 211–911)

## 2022-10-06 LAB — TSH: TSH: 2.67 u[IU]/mL (ref 0.35–5.50)

## 2022-10-06 MED ORDER — PITAVASTATIN CALCIUM 2 MG PO TABS: 2.0000 mg | ORAL_TABLET | Freq: Every day | ORAL | 1 refills | Status: DC

## 2022-10-06 NOTE — Progress Notes (Cosign Needed)
Assessment/Plan:    The patient is seen in neurologic consultation at the request of Josue Hector, MD for the evaluation of memory.  Holly Simmons is a very pleasant 82 y.o. year old RH female with  a history of hypertension, hyperlipidemia, multiple cardiology issues including pacemaker placement, TV replacement, chronic anticoagulation on Coumadin, thrombocytopenia, aortic stenosis, CKD, insomnia , seen today for evaluation of memory loss.  She has a strong family history of Alzheimer's disease.  MoCA today is 5/30.  She is dependent on several ADLs, and she also was noted by daughter to have some swallowing difficulties.  All these findings, are suspicious for dementia likely due to Alzheimer's disease.  She is also on Rexulti for agitation as per PCP.  She had a CT of the head on August 2023, remarkable for cerebral atrophy, chronic microvascular disease.  No acute findings were noted.  There is no indication to repeat the testing.  Dementia likely due to Alzheimer's disease, late onset, with behavioral disturbance  Check B12, TSH Continue Rexulti as per PCP for behavioral disturbance Continue to control cardiovascular risk factors, follow-up with cardiology Due to advanced disease, there is no indication for antidementia medication Recommend 24/7 monitoring for safety  Referral to ST for choking episodes  Folllow up in 6 months     Subjective:    The patient is accompanied by her daughter, and her son on the phone who supplement the history.    How long did patient have memory difficulties?  For at least 6 years, worse over the last 6 months "where he took a drastic turn ".  She has difficulty recalling conversations, names.  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    repeats oneself?  Endorsed, at times asking several times "where is [name]?  (Son) ".  Sometimes she mixes her husband with her son. Disoriented when walking into a room?  Patient denies,  however sometimes she may think she is at her home when she is at her son's house   Wandering behavior? denies   Any personality changes since last visit?  "Much more agitated and aggressive over the last 6 months, she was placed on Rexulti ".   Any worsening depression?:"possibly" Hallucinations or paranoia?  denies   Seizures?   denies    Any sleep changes?  " I fall asleep in the chair ", sleeps well at night.  Denies vivid dreams, REM behavior or sleepwalking   Sleep apnea? Endorsed, does not like to use the CPAP  Any hygiene concerns?  denies   Independent of bathing and dressing?  Endorsed  Does the patient need help with medications?  Son and daughter are in charge.  Her son keeps the drug books and her daughter gets the tray and administers her meds . Who is in charge of the finances? Son in charge    Any changes in appetite?   denies .  They push water. Patient have trouble swallowing?  Endorsed, she chokes frequently over the last few months, take small bites.  She had an endoscopy recently, which was negative  Does the patient cook?  No Any headaches?  denies   Chronic back pain  denies   Ambulates with difficulty?  Some days she is more "wobbly "she uses the walker Recent falls or head injuries?  Denies, close calls      Unilateral weakness, numbness or tingling?  denies   Any tremors?  denies  Any anosmia?  denies   Any incontinence of urine? "Sometimes she has accidents, she does not know its too late and has an accident , wears depends " Any bowel dysfunction?  Occasional constipation  Patient lives   with daughter  History of heavy alcohol intake? denies   History of heavy tobacco use?   denies   Family history of dementia?   Endorsed Dose patient drive?  Not for the last  2 years  Allergies  Allergen Reactions   Petrolatum-Zinc Oxide Anaphylaxis, Rash and Swelling   Sulfa Antibiotics Rash   Sulfonamide Derivatives Anaphylaxis and Swelling   Tetanus Toxoid      Other reaction(s): Other (See Comments) OTHER REACTION   Tetanus Toxoids Swelling   Other Rash and Other (See Comments)    STERI - STRIPS  It can affect proteins in her kidneys so she doesn't take REACTION: proteinuria Other reaction(s): Other (See Comments), Unknown It can affect proteins in her kidneys so she doesn't take   Nsaids Other (See Comments)    REACTION: proteinuria Other reaction(s): Other (See Comments), Unknown It can affect proteins in her kidneys so she doesn't take    Tetanus Toxoid, Adsorbed Other (See Comments)    Other reaction(s): Other (See Comments) Turned arm red Turned arm red    Current Outpatient Medications  Medication Instructions   allopurinol (ZYLOPRIM) 100 mg, Oral, Daily   amoxicillin (AMOXIL) 500 MG capsule TAKE 4 CAPSULES 1 HOUR PRIOR TO PROCEDURE   Betamethasone Valerate 0.12 % foam APPLY TO SCALP AS DIRECTED.   brexpiprazole (REXULTI) 0.25 mg, Oral, Daily   buPROPion (WELLBUTRIN XL) 300 MG 24 hr tablet TAKE ONE TABLET BY MOUTH ONCE DAILY   dronabinol (MARINOL) 5 MG capsule Take 1 capsule by mouth two times daily before lunch and supper.   famotidine (PEPCID) 40 mg, Oral, Daily   ferrous sulfate 325 mg, Oral, 2 times daily with meals   ipratropium (ATROVENT) 0.03 % nasal spray USE 2 SPRAYS IN EACH NOSTRIL 3 TIMES A DAY AS NEEDED.   lamoTRIgine (LAMICTAL) 25 MG tablet TAKE ONE TABLET BY MOUTH ONCE DAILY   metoprolol tartrate (LOPRESSOR) 25 mg, Oral, 2 times daily   modafinil (PROVIGIL) 100 mg, Oral, Daily   Pitavastatin Calcium 2 mg, Oral, Daily   PRESCRIPTION MEDICATION 1 Dose, Other, At bedtime PRN, CPAP    Propylene Glycol (SYSTANE BALANCE OP) 1 drop, Both Eyes, 4 times daily PRN   SYMBICORT 80-4.5 MCG/ACT inhaler USE 2 PUFFS TWICE A DAY.   torsemide (DEMADEX) 20 mg, Oral, Daily   Trulance 3 mg, Oral, Daily   Vilazodone HCl (VIIBRYD) 40 mg, Oral, Daily   warfarin (COUMADIN) 2.5 MG tablet TAKE 1 TABLET BY MOUTH DAILY EXCEPT TAKE 1/2  TABLET ON WEDNESDAYS OR AS DIRECTED BY ANTICOAGULATION CLINIC   warfarin (COUMADIN) 1 mg, Oral, Daily, TAKE 1 1/2 TABLET EVERY WEDNESDAY OR AS DIRECTED BY ANTICOAGULATION LCINIC     VITALS:   Vitals:   10/06/22 0946  BP: (!) 156/80  Pulse: 82  SpO2: 93%  Weight: 161 lb (73 kg)  Height: '5\' 3"'$  (1.6 m)      10/06/2022    4:20 PM 07/01/2022    2:30 PM 02/26/2022    3:47 PM 11/22/2021    2:45 PM 06/20/2021    4:17 PM  Depression screen PHQ 2/9  Decreased Interest 0 0 0 1 1  Down, Depressed, Hopeless 3 3 0 0 3  PHQ - 2 Score 3 3 0 1 4  Altered sleeping 1 1 0    Tired, decreased energy 0 0 0    Change in appetite 2 2 0    Feeling bad or failure about yourself  1 1 0    Trouble concentrating  0 0    Moving slowly or fidgety/restless 0 1 0    Suicidal thoughts 0 0 0    PHQ-9 Score 7 8 0      PHYSICAL EXAM   HEENT:  Normocephalic, atraumatic. The mucous membranes are moist. The superficial temporal arteries are without ropiness or tenderness. Cardiovascular: Regular rate and rhythm. Lungs: Clear to auscultation bilaterally. Neck: There are no carotid bruits noted bilaterally.  NEUROLOGICAL:    10/06/2022    6:00 PM  Montreal Cognitive Assessment   Visuospatial/ Executive (0/5) 2  Naming (0/3) 2  Attention: Read list of digits (0/2) 0  Attention: Read list of letters (0/1) 0  Attention: Serial 7 subtraction starting at 100 (0/3) 1  Language: Repeat phrase (0/2) 0  Language : Fluency (0/1) 0  Abstraction (0/2) 0  Delayed Recall (0/5) 0  Orientation (0/6) 1  Total 6  Adjusted Score (based on education) 7       10/06/2022    4:35 PM  MMSE - Mini Mental State Exam  Not completed: Unable to complete     Orientation:  Alert and oriented to person,not to place or time No aphasia or dysarthria. Fund of knowledge is reduced. Recent and remote memory is impaired.   Attention and concentration are reduced  able to name objects 2/3 and unable to repeat phrases. Delayed recall   0/5 Cranial nerves: There is good facial symmetry. Extraocular muscles are intact and visual fields are full to confrontational testing. Speech is fluent and clear. no tongue deviation. Hearing is intact to conversational tone. Tone: Tone is good throughout. Sensation: Sensation is intact to light touch and pinprick throughout. Vibration is intact  There is no sensory dermatomal level identified. Coordination: The patient has difficulty with RAM's or FNF bilaterally. Abnormal finger to nose  Motor: Strength is unable to be tested, patient was not following this command  There is no pronator drift. There are no fasciculations noted. DTR's: Deep tendon reflexes are 1/4 at the bilateral biceps, triceps, brachioradialis, patella and achilles.  Plantar responses are downgoing bilaterally. Gait and Station: The patient is able to ambulate without difficulty, uses a walker .The patient is able to ambulate in a tandem fashion, able to stand in the Romberg position.     Thank you for allowing [place] the opportunity to participate in the care of this nice patient. Please do not hesitate to contact us for any questions or concerns.   Total time spent on today's visit was 60 minutes dedicated to this patient today, preparing to see patient, examining the patient, ordering tests and/or medications and counseling the patient, documenting clinical information in the EHR or other health record, independently interpreting results and communicating results to the patient/family, discussing treatment and goals, answering patient's questions and coordinating care.  Cc:  Janith Lima, MD  Sharene Butters 10/06/2022 6:05 PM

## 2022-10-06 NOTE — Progress Notes (Signed)
I connected with  Holly Simmons and son, [name] on 10/06/2022 at 4:00 p.m. EST by telephone and verified that I am speaking with the correct person using two identifiers.  Location: Patient: Home Provider: [place] Persons participating in the virtual visit: [place]   I discussed the limitations, risks, security and privacy concerns of performing an evaluation and management service by telephone and the availability of in person appointments. The patient expressed understanding and agreed to proceed.  Interactive audio and video telecommunications were attempted between this nurse and patient, however failed, due to patient having technical difficulties OR patient did not have access to video capability.  We continued and completed visit with audio only.  Some vital signs may be absent or patient reported.   Sheral Flow, LPN  Subjective:   [name] is a 82 y.o. female who presents for Medicare Annual (Subsequent) preventive examination.  Patient Medicare AWV questionnaire was completed by the patient's son on 10/01/2022; I have confirmed that all information answered by patient is correct and no changes since this date.    Review of Systems     Cardiac Risk Factors include: advanced age (>26mn, >>37women);dyslipidemia;family history of premature cardiovascular disease;hypertension;diabetes mellitus     Objective:    Today's Vitals   10/06/22 1604 10/06/22 1606  Weight: 161 lb (73 kg)   Height: '5\' 3"'$  (1.6 m)   PainSc: 0-No pain 0-No pain   Body mass index is 28.52 kg/m.     10/06/2022    9:53 AM 06/27/2022   11:00 PM 06/06/2022    3:15 PM 03/18/2022    6:20 PM 09/05/2021   11:47 PM 09/05/2021   11:44 PM 09/05/2021   11:59 AM  Advanced Directives  Does Patient Have a Medical Advance Directive? Yes No Yes No Yes  Yes  Type of [place]  [place]   Living will Living will Living will;Healthcare Power of Attorney  Does patient want to make changes to medical advance directive?     No - Patient declined    Would patient like information on creating a medical advance directive?  No - Patient declined         Current Medications (verified) Outpatient Encounter Medications as of 10/06/2022  Medication Sig   allopurinol (ZYLOPRIM) 100 MG tablet Take 1 tablet by mouth daily.   amoxicillin (AMOXIL) 500 MG capsule TAKE 4 CAPSULES 1 HOUR PRIOR TO PROCEDURE   Betamethasone Valerate 0.12 % foam APPLY TO SCALP AS DIRECTED. (Patient taking differently: 1 application  once a week.)   brexpiprazole (REXULTI) 0.25 MG TABS tablet Take 1 tablet (0.25 mg total) by mouth daily.   buPROPion (WELLBUTRIN XL) 300 MG 24 hr tablet TAKE ONE TABLET BY MOUTH ONCE DAILY   dronabinol (MARINOL) 5 MG capsule Take 1 capsule by mouth two times daily before lunch and supper.   famotidine (PEPCID) 40 MG tablet Take 1 tablet by mouth daily.   ferrous sulfate 325 (65 FE) MG tablet Take 1 tablet (325 mg total) by mouth 2 (two) times daily with a meal. (Patient taking differently: Take 325-650 mg by mouth daily with breakfast.)   ipratropium (ATROVENT) 0.03 % nasal spray USE 2 SPRAYS IN EACH NOSTRIL 3 TIMES A DAY AS NEEDED. (Patient taking differently: Place 2 sprays into both nostrils daily as needed (nose bleed).)   lamoTRIgine (LAMICTAL) 25 MG tablet TAKE ONE TABLET BY MOUTH ONCE DAILY   modafinil (  PROVIGIL) 100 MG tablet Take 1 tablet (100 mg total) by mouth daily. (Patient taking differently: Take 100 mg by mouth daily as needed (sleep disorder).)   Plecanatide (TRULANCE) 3 MG TABS Take 1 tablet (3 mg total) by mouth daily.   PRESCRIPTION MEDICATION 1 Dose by Other route at bedtime as needed (sleep). CPAP   Propylene Glycol (SYSTANE BALANCE OP) Place 1 drop into both eyes 4 (four) times daily as needed (dry eyes).   SYMBICORT 80-4.5 MCG/ACT inhaler USE 2 PUFFS TWICE A DAY.  (Patient taking differently: 2 puffs 2 (two) times daily.)   torsemide (DEMADEX) 20 MG tablet Take 1 tablet (20 mg total) by mouth daily.   Vilazodone HCl (VIIBRYD) 40 MG TABS Take 1 tablet by mouth daily.   warfarin (COUMADIN) 1 MG tablet Take 1 tablet (1 mg total) by mouth daily. TAKE 1 1/2 TABLET EVERY WEDNESDAY OR AS DIRECTED BY ANTICOAGULATION LCINIC   warfarin (COUMADIN) 2.5 MG tablet TAKE 1 TABLET BY MOUTH DAILY EXCEPT TAKE 1/2 TABLET ON WEDNESDAYS OR AS DIRECTED BY ANTICOAGULATION CLINIC   [DISCONTINUED] metoprolol tartrate (LOPRESSOR) 25 MG tablet Take 1 tablet (25 mg total) by mouth 2 (two) times daily.   [DISCONTINUED] Pitavastatin Calcium 2 MG TABS Take 1 tablet (2 mg total) by mouth daily.   No facility-administered encounter medications on file as of 10/06/2022.    Allergies (verified) Petrolatum-zinc oxide; Sulfa antibiotics; Sulfonamide derivatives; Tetanus toxoid; Tetanus toxoids; Other; Nsaids; and Tetanus toxoid, adsorbed   History: Past Medical History:  Diagnosis Date   Allergic rhinitis    Asthma    NOS w/ acute exacerbation   Blood transfusion without reported diagnosis    CKD (chronic kidney disease) stage 3, GFR 30-59 ml/min (HCC) 09/12/2018   Colon polyps    TUBULAR ADENOMAS AND HYPERPLASTIC   Complete heart block (HCC)    requiring PPM (MDT) post surgical myomectomy at [facility],  leads are epicardial with abdominal implant, high ventricular threshold at implant   COPD (chronic obstructive pulmonary disease) (HCC)    Depressive disorder    Diastolic dysfunction    DM (diabetes mellitus) ([place])    Gallstones    GERD (gastroesophageal reflux disease)    Gout 08/20/2012   Heart murmur    Hyperpotassemia    Hypersomnia    Hypertension    Hypertrophic cardiomyopathy ([place])    s/p surgical myomectomy at [facility] 0000000 complicated by septal VSD post procedure requiring reoperation with patch closure and tricuspid valve replacement   Kidney stones    Myocardial infarction  ([place]) 2011   Obstructive sleep apnea    persistent daytime sleepiness despite cpap   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   Psoriasis    Pyuria    Renal insufficiency    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   [place]ical atrial flutter ([place] 9/15   Past Surgical History:  Procedure Laterality Date   ABDOMINAL HYSTERECTOMY     APPENDECTOMY     BREAST CYST EXCISION     CARDIOVERSION N/A 08/01/2014   Procedure: CARDIOVERSION;  Surgeon: PThayer Headings MD;  [facility]  Service: Cardiovascular;  Laterality: N/A;   CESAREAN SECTION     CHOLECYSTECTOMY     COLONOSCOPY     EYE SURGERY  01/16/2016   left eye lens implant   EYE SURGERY  01/09/2016   right eye lens implant   INSERT / REPLACE / REMOVE PACEMAKER  07/13/2012   PACEMAKER INSERTION  07/02/2011   epicardial wires with abdominal  implant at [facility] 12/12,  high ventricular lead threshold at implant per Dr Westley Gambles   PERMANENT PACEMAKER INSERTION N/A 07/13/2012   Procedure: PERMANENT PACEMAKER INSERTION;  Surgeon: Thompson Grayer, MD;  Location: [facility] CATH LAB;  Service: Cardiovascular;  Laterality: N/A;   POLYPECTOMY     septal myomectomy for hypertrophic CM  06/23/2011   by Dr Evelina Dun at [facility], complicated by septal VSD requiring patch repair and tricuspid valve replacement   TONSILLECTOMY     TOOTH EXTRACTION     TRICUSPID VALVE REPLACEMENT  06/2011   Medtronic 69m Mosaic tissue valve   VSD REPAIR  06/2011   Family History  Problem Relation Age of Onset   Hypertension Daughter    Heart disease Maternal Grandfather    Heart disease Paternal Grandfather    Bladder Cancer Father    Prostate cancer Father    Cancer Father    Varicose Veins Father    Heart attack Father    Breast cancer Mother    Dementia Mother    Hypertension Mother    Cancer Mother    Ovarian cancer Maternal Aunt    Pancreatic cancer Cousin    Breast cancer Paternal Aunt    Dementia Maternal Aunt        x 2   Dementia Maternal Uncle        x 2    Colon cancer Neg Hx    Rectal cancer Neg Hx    Stomach cancer Neg Hx    Esophageal cancer Neg Hx    Social History   Socioeconomic History   Marital status: Widowed    Spouse name: HNadara Mustard  Number of children: 3   Years of education: Not on file   Highest education level: Not on file  Occupational History   Occupation: retired    EFish farm manager RETIRED  Tobacco Use   Smoking status: Never   Smokeless tobacco: Never  Vaping Use   Vaping Use: Never used  Substance and Sexual Activity   Alcohol use: No    Alcohol/week: 0.0 standard drinks of alcohol   Drug use: No   Sexual activity: Not Currently  Other Topics Concern   Not on file  Social History Narrative   ** Merged History Encounter **    Are you right handed or left handed? left   Are you currently employed ?    What is your current occupation? retired   Do you live at home alone? With daughter   Who lives with you?    What type of home do you live in: 1 story or 2 story? two    Caffeine rarely   Social Determinants of Health   Financial Resource Strain: Unknown (10/06/2022)   Overall Financial Resource Strain (CARDIA)    Difficulty of Paying Living Expenses: Patient refused  Food Insecurity: Unknown (10/06/2022)   Hunger Vital Sign    Worried About Running Out of Food in the Last Year: Patient refused    [place] the Last Year: Patient refused  Transportation Needs: No Transportation Needs (10/06/2022)   PRAPARE - THydrologist(Medical): No    Lack of Transportation (Non-Medical): No  Physical Activity: Insufficiently Active (10/06/2022)   Exercise Vital Sign    Days of Exercise per Week: 2 days    Minutes of Exercise per Session: 10 min  Stress: Unknown (10/06/2022)   [place]   Feeling of Stress :  Patient refused  Social Connections: Unknown (10/06/2022)   Social Connection and Isolation Panel [NHANES]     Frequency of Communication with Friends and Family: Patient refused    Frequency of Social Gatherings with Friends and Family: Patient refused    Attends Religious Services: Not on Diplomatic Services operational officer of Clubs or Organizations: Yes    Attends Archivist Meetings: More than 4 times per year    Marital Status: Widowed    Tobacco Counseling Counseling given: Not Answered   Clinical Intake:  Pre-visit preparation completed: Yes  Pain : No/denies pain Pain Score: 0-No pain     BMI - recorded: 28.52 Nutritional Status: BMI 25 -29 Overweight Nutritional Risks: None Diabetes: No  How often do you need to have someone help you when you read instructions, pamphlets, or other written materials from your doctor or pharmacy?: 5 - Always  Diabetic? No  Interpreter Needed?: No  Information entered by :: Lisette Abu, LPN.   Activities of Daily Living    10/06/2022    4:33 PM 10/01/2022   10:32 PM  In your present state of health, do you have any difficulty performing the following activities:  Hearing? 1 1  Vision? 1 1  Difficulty concentrating or making decisions? 1 1  Walking or climbing stairs? 1 1  Dressing or bathing? 1 1  Doing errands, shopping? 1 1  Preparing Food and eating ? Y Y  Using the Toilet? N N  In the past six months, have you accidently leaked urine? N N  Do you have problems with loss of bowel control? N N  Managing your Medications? Y Y  Managing your Finances? Tempie Donning  Housekeeping or managing your Housekeeping? Tempie Donning    Patient Care Team: Janith Lima, MD as PCP - General (Internal Medicine) Josue Hector, MD as PCP - Cardiology (Cardiology) Janith Lima, MD Jola Schmidt, MD as Consulting Physician (Ophthalmology)  Indicate any recent Medical Services you may have received from other than [facility] providers in the past year (date may be approximate).     Assessment:   This is a routine wellness examination for  [place].  Hearing/Vision screen Hearing Screening - Comments:: Patient wears hearing aids. Vision Screening - Comments:: Wears rx glasses - up to date with routine eye exams with Jola Schmidt, MD.   Dietary issues and exercise activities discussed: Current Exercise Habits: Home exercise routine, Time (Minutes): 10, Frequency (Times/Week): 2, Weekly Exercise (Minutes/Week): 20, Intensity: Mild, Exercise limited by: cardiac condition(s);psychological condition(s);respiratory conditions(s);orthopedic condition(s);neurologic condition(s)   Goals Addressed             This Visit's Progress    Client understands the importance of follow-up with providers by attending scheduled visits        Depression Screen    10/06/2022    4:20 PM 07/01/2022    2:30 PM 02/26/2022    3:47 PM 11/22/2021    2:45 PM 06/20/2021    4:17 PM 02/21/2021    3:31 PM 12/04/2020   10:35 AM  PHQ 2/9 Scores  PHQ - 2 Score 3 3 0 '1 4 6 '$ 0  PHQ- 9 Score 7 8 0   16 0    Fall Risk    10/06/2022    4:24 PM 10/06/2022    9:53 AM 10/01/2022   10:32 PM 03/04/2022    9:41 AM 11/22/2021    2:45 PM  Fall Risk   Falls in the past year?  0 0 0 0 0  Comment    caregiver continunes to deny new/ recent patient falls x last 12 months; continues to use walker as needed/ indicated caregiver/ son continues to deny falls x 12 months; continues using rollator walker  Number falls in past yr: 0 0  0 0  Injury with Fall? 0 0  0 0  Comment    N/A- no falls reported N/A- no falls reported  Risk for fall due to : Impaired balance/gait   Mental status change;Medication side effect Medication side effect;Impaired mobility  Follow up Falls prevention discussed;Falls evaluation completed Falls evaluation completed  Falls prevention discussed Falls prevention discussed    FALL RISK PREVENTION PERTAINING TO THE HOME:  Any stairs in or around the home? Yes ; lives on main level If so, are there any without handrails? No  Home free of loose throw  rugs in walkways, pet beds, electrical cords, etc? Yes  Adequate lighting in your home to reduce risk of falls? Yes   ASSISTIVE DEVICES UTILIZED TO PREVENT FALLS:  Life alert? No  Use of a cane, walker or w/c? Yes  Grab bars in the bathroom? Yes  Shower chair or bench in shower? Yes  Elevated toilet seat or a handicapped toilet? Yes   TIMED UP AND GO:  Was the test performed? No . Telephonic Visit  Cognitive Function:    10/06/2022    4:35 PM  MMSE - Mini Mental State Exam  Not completed: Unable to complete        10/06/2022    4:34 PM 03/07/2020    2:31 PM  6CIT Screen  What Year? 0 points 0 points  What month? 0 points 0 points  What time? 0 points 3 points  Count back from 20 0 points 0 points  Months in reverse 0 points 2 points  Repeat phrase 0 points 10 points  Total Score 0 points 15 points    Immunizations Immunization History  Administered Date(s) Administered   Fluad Quad(high Dose 65+) 04/04/2019, 06/06/2021   Influenza Split 05/14/2011, 05/12/2012   Influenza Whole 05/02/2009, 04/24/2010   Influenza, High Dose Seasonal PF 04/15/2016, 05/06/2017, 04/20/2018   Influenza,inj,Quad PF,6+ Mos 04/13/2013, 04/19/2014, 05/02/2015   Influenza-Unspecified 04/25/2020, 05/05/2022   Moderna Covid-19 Vaccine Bivalent Booster 49yr & up 06/25/2021, 05/05/2022   Moderna Sars-Covid-2 Vaccination 08/07/2019, 09/14/2019, 05/30/2020, 01/15/2021, 02/07/2022   Pneumococcal Conjugate-13 01/18/2014   Pneumococcal Polysaccharide-23 08/10/2008, 05/30/2015, 06/06/2021   Varicella 03/11/2006   Zoster Recombinat (Shingrix) 12/29/2017, 04/02/2018   Zoster, Live 08/04/2014    TDAP status: patient allergic to vaccine  Flu Vaccine status: Up to date  Pneumococcal vaccine status: Up to date  Covid-19 vaccine status: Completed vaccines  Qualifies for Shingles Vaccine? Yes   Zostavax completed Yes   Shingrix Completed?: Yes  Screening Tests Health Maintenance  Topic Date Due    Hepatitis C Screening  Never done   FOOT EXAM  11/23/2019   COVID-19 Vaccine (8 - 2023-24 season) 06/30/2022   HEMOGLOBIN A1C  09/18/2022   Diabetic kidney evaluation - Urine ACR  03/21/2023   OPHTHALMOLOGY EXAM  05/17/2023   Diabetic kidney evaluation - eGFR measurement  08/22/2023   Medicare Annual Wellness (AWV)  10/06/2023   COLONOSCOPY (Pts 45-433yrInsurance coverage will need to be confirmed)  09/14/2025   Pneumonia Vaccine 6568Years old  Completed   INFLUENZA VACCINE  Completed   DEXA SCAN  Completed   Zoster Vaccines- Shingrix  Completed   HPV VACCINES  Aged Out   DTaP/Tdap/Td  Discontinued    Health Maintenance  Health Maintenance Due  Topic Date Due   Hepatitis C Screening  Never done   FOOT EXAM  11/23/2019   COVID-19 Vaccine (8 - 2023-24 season) 06/30/2022   HEMOGLOBIN A1C  09/18/2022    Colorectal cancer screening: No longer required.   Mammogram status: Completed 07/02/2022. Repeat every year  Bone Density status: Completed 06/20/2015. Results reflect: Bone density results: NORMAL. Repeat every 5 years.  Lung Cancer Screening: (Low Dose CT Chest recommended if Age 65-80 years, 30 pack-year currently smoking OR have quit w/in 15years.) does not qualify.   Lung Cancer Screening Referral: no  Additional Screening:  Hepatitis C Screening: does not qualify; Completed no  Vision Screening: Recommended annual ophthalmology exams for early detection of glaucoma and other disorders of the eye. Is the patient up to date with their annual eye exam?  Yes  Who is the provider or what is the name of the office in which the patient attends annual eye exams? Jola Schmidt, MD. If pt is not established with a provider, would they like to be referred to a provider to establish care? No .   Dental Screening: Recommended annual dental exams for proper oral hygiene  Community Resource Referral / Chronic Care Management: CRR required this visit?  No   CCM required this  visit?  No      Plan:     I have personally reviewed and noted the following in the patient's chart:   Medical and social history Use of alcohol, tobacco or illicit drugs  Current medications and supplements including opioid prescriptions. Patient is not currently taking opioid prescriptions. Functional ability and status Nutritional status Physical activity Advanced directives List of other physicians Hospitalizations, surgeries, and ER visits in previous 12 months Vitals Screenings to include cognitive, depression, and falls Referrals and appointments  In addition, I have reviewed and discussed with patient certain preventive protocols, quality metrics, and best practice recommendations. A written personalized care plan for preventive services as well as general preventive health recommendations were provided to patient.     Sheral Flow, LPN   [id number]   Nurse Notes:  Patient not able to complete 6CRIT.  Patient Medicare AWV questionnaire was completed by the patient's son on 10/01/2022; I have confirmed that all information answered by patient is correct and no changes since this date.

## 2022-10-06 NOTE — Patient Instructions (Addendum)
It was a pleasure to see you today at our office.   Recommendations:  Follow up in 6  months  Check B12 and thyroid   Whom to call:  Memory  decline, memory medications: Call our office [phone]   For psychiatric meds, mood meds: Please have your primary care physician manage these medications.   Counseling regarding caregiver distress, including caregiver depression, anxiety and issues regarding community resources, adult day care programs, adult living facilities, or memory care questions:   Feel free to contact [place], Social Worker at [phone]   For assessment of decision of mental capacity and competency:  Call Dr. Anthoney Harada, geriatric psychiatrist at [phone]  For guidance in geriatric dementia issues please call [facility] [phone]     Feel free to visit Facebook page " [email or web address]" for tips of how to care for people with memory problems.      RECOMMENDATIONS FOR ALL PATIENTS WITH MEMORY PROBLEMS: 1. Continue to exercise (Recommend 30 minutes of walking everyday, or 3 hours every week) 2. Increase social interactions - continue going to [place] and enjoy social gatherings with friends and family 3. Eat healthy, avoid fried foods and eat more fruits and vegetables 4. Maintain adequate blood pressure, blood sugar, and blood cholesterol level. Reducing the risk of stroke and cardiovascular disease also helps promoting better memory. 5. Avoid stressful situations. Live a simple life and avoid aggravations. Organize your time and prepare for the next day in anticipation. 6. Sleep well, avoid any interruptions of sleep and avoid any distractions in the bedroom that may interfere with adequate sleep quality 7. Avoid sugar, avoid sweets as there is a strong link between excessive sugar intake, diabetes, and cognitive impairment We discussed the Mediterranean diet, which has been shown to help patients reduce the risk of progressive memory  disorders and reduces cardiovascular risk. This includes eating fish, eat fruits and green leafy vegetables, nuts like almonds and hazelnuts, walnuts, and also use olive oil. Avoid fast foods and fried foods as much as possible. Avoid sweets and sugar as sugar use has been linked to worsening of memory function.  There is always a concern of gradual progression of memory problems. If this is the case, then we may need to adjust level of care according to patient needs. Support, both to the patient and caregiver, should then be put into place.    The Alzheimer's Association is here all day, every day for people facing Alzheimer's disease through our free 24/7 Helpline: [phone]. The Helpline provides reliable information and support to all those who need assistance, such as individuals living with memory loss, Alzheimer's or other dementia, caregivers, health care professionals and the public.  Our highly trained and knowledgeable staff can help you with: Understanding memory loss, dementia and Alzheimer's  Medications and other treatment options  General information about aging and brain health  Skills to provide quality care and to find the best care from professionals  Legal, financial and living-arrangement decisions Our Helpline also features: Confidential care consultation provided by master's level clinicians who can help with decision-making support, crisis assistance and education on issues families face every day  Help in a caller's preferred language using our translation service that features more than 200 languages and dialects  Referrals to local community programs, services and ongoing support     FALL PRECAUTIONS: Be cautious when walking. Scan the area for obstacles that may increase the risk of trips and falls. When getting up in the  mornings, sit up at the edge of the bed for a few minutes before getting out of bed. Consider elevating the bed at the head end to avoid drop of  blood pressure when getting up. Walk always in a well-lit room (use night lights in the walls). Avoid area rugs or power cords from appliances in the middle of the walkways. Use a walker or a cane if necessary and consider physical therapy for balance exercise. Get your eyesight checked regularly.  FINANCIAL OVERSIGHT: Supervision, especially oversight when making financial decisions or transactions is also recommended.  HOME SAFETY: Consider the safety of the kitchen when operating appliances like stoves, microwave oven, and blender. Consider having supervision and share cooking responsibilities until no longer able to participate in those. Accidents with firearms and other hazards in the house should be identified and addressed as well.   ABILITY TO BE LEFT ALONE: If patient is unable to contact 911 operator, consider using LifeLine, or when the need is there, arrange for someone to stay with patients. Smoking is a fire hazard, consider supervision or cessation. Risk of wandering should be assessed by caregiver and if detected at any point, supervision and safe proof recommendations should be instituted.  MEDICATION SUPERVISION: Inability to self-administer medication needs to be constantly addressed. Implement a mechanism to ensure safe administration of the medications.       Mediterranean Diet A Mediterranean diet refers to food and lifestyle choices that are based on the traditions of countries located on the [employer]. This way of eating has been shown to help prevent certain conditions and improve outcomes for people who have chronic diseases, like kidney disease and heart disease. What are tips for following this plan? Lifestyle  Cook and eat meals together with your family, when possible. Drink enough fluid to keep your urine clear or pale yellow. Be physically active every day. This includes: Aerobic exercise like running or swimming. Leisure activities like gardening,  walking, or housework. Get 7-8 hours of sleep each night. If recommended by your health care provider, drink red wine in moderation. This means 1 glass a day for nonpregnant women and 2 glasses a day for men. A glass of wine equals 5 oz (150 mL). Reading food labels  Check the serving size of packaged foods. For foods such as rice and pasta, the serving size refers to the amount of cooked product, not dry. Check the total fat in packaged foods. Avoid foods that have saturated fat or trans fats. Check the ingredients list for added sugars, such as corn syrup. Shopping  At the grocery store, buy most of your food from the areas near the walls of the store. This includes: Fresh fruits and vegetables (produce). Grains, beans, nuts, and seeds. Some of these may be available in unpackaged forms or large amounts (in bulk). Fresh seafood. Poultry and eggs. Low-fat dairy products. Buy whole ingredients instead of prepackaged foods. Buy fresh fruits and vegetables in-season from local farmers markets. Buy frozen fruits and vegetables in resealable bags. If you do not have access to quality fresh seafood, buy precooked frozen shrimp or canned fish, such as tuna, salmon, or sardines. Buy small amounts of raw or cooked vegetables, salads, or olives from the deli or salad bar at your store. Stock your pantry so you always have certain foods on hand, such as olive oil, canned tuna, canned tomatoes, rice, pasta, and beans. Cooking  Cook foods with extra-virgin olive oil instead of using butter or other vegetable oils.  Have meat as a side dish, and have vegetables or grains as your main dish. This means having meat in small portions or adding small amounts of meat to foods like pasta or stew. Use beans or vegetables instead of meat in common dishes like chili or lasagna. Experiment with different cooking methods. Try roasting or broiling vegetables instead of steaming or sauteing them. Add frozen vegetables  to soups, stews, pasta, or rice. Add nuts or seeds for added healthy fat at each meal. You can add these to yogurt, salads, or vegetable dishes. Marinate fish or vegetables using olive oil, lemon juice, garlic, and fresh herbs. Meal planning  Plan to eat 1 vegetarian meal one day each week. Try to work up to 2 vegetarian meals, if possible. Eat seafood 2 or more times a week. Have healthy snacks readily available, such as: Vegetable sticks with hummus. Greek yogurt. Fruit and nut trail mix. Eat balanced meals throughout the week. This includes: Fruit: 2-3 servings a day Vegetables: 4-5 servings a day Low-fat dairy: 2 servings a day Fish, poultry, or lean meat: 1 serving a day Beans and legumes: 2 or more servings a week Nuts and seeds: 1-2 servings a day Whole grains: 6-8 servings a day Extra-virgin olive oil: 3-4 servings a day Limit red meat and sweets to only a few servings a month What are my food choices? Mediterranean diet Recommended Grains: Whole-grain pasta. Brown rice. Bulgar wheat. Polenta. Couscous. Whole-wheat bread. [place]. Vegetables: Artichokes. Beets. Broccoli. Cabbage. Carrots. Eggplant. Green beans. Chard. Kale. Spinach. Onions. Leeks. Peas. Squash. Tomatoes. Peppers. Radishes. Fruits: Apples. Apricots. Avocado. Berries. Bananas. Cherries. Dates. Figs. Grapes. Lemons. Melon. Oranges. Peaches. Plums. Pomegranate. Meats and other protein foods: Beans. Almonds. Sunflower seeds. Pine nuts. Peanuts. [place]. Salmon. Scallops. Shrimp. [place]. Tilapia. Clams. Oysters. Eggs. Dairy: Low-fat milk. Cheese. Greek yogurt. Beverages: Water. Red wine. Herbal tea. Fats and oils: Extra virgin olive oil. Avocado oil. Grape seed oil. Sweets and desserts: [place] yogurt with honey. Baked apples. Poached pears. Trail mix. Seasoning and other foods: Basil. Cilantro. Coriander. Cumin. Mint. Parsley. Sage. Rosemary. Tarragon. Garlic. Oregano. Thyme. Pepper. Balsalmic vinegar. Tahini.  Hummus. Tomato sauce. Olives. Mushrooms. Limit these Grains: Prepackaged pasta or rice dishes. Prepackaged cereal with added sugar. Vegetables: Deep fried potatoes (french fries). Fruits: Fruit canned in syrup. Meats and other protein foods: Beef. Pork. Lamb. Poultry with skin. Hot dogs. [place] Salines. Dairy: Ice cream. Sour cream. Whole milk. Beverages: Juice. Sugar-sweetened soft drinks. Beer. Liquor and spirits. Fats and oils: Butter. Canola oil. Vegetable oil. Beef fat (tallow). Lard. Sweets and desserts: Cookies. Cakes. Pies. Candy. Seasoning and other foods: Mayonnaise. Premade sauces and marinades. The items listed may not be a complete list. Talk with your dietitian about what dietary choices are right for you. Summary The Mediterranean diet includes both food and lifestyle choices. Eat a variety of fresh fruits and vegetables, beans, nuts, seeds, and whole grains. Limit the amount of red meat and sweets that you eat. Talk with your health care provider about whether it is safe for you to drink red wine in moderation. This means 1 glass a day for nonpregnant women and 2 glasses a day for men. A glass of wine equals 5 oz (150 mL). This information is not intended to replace advice given to you by your health care provider. Make sure you discuss any questions you have with your health care provider. Document Released: 03/13/2016 Document Revised: 04/15/2016 Document Reviewed: 03/13/2016 Elsevier Interactive Patient Education  2017 [employer].

## 2022-10-06 NOTE — Telephone Encounter (Signed)
Key: [id number] Coverage Ends on: 08/04/2023

## 2022-10-06 NOTE — Patient Instructions (Addendum)
Holly Simmons , Thank you for taking time to come for your Medicare Wellness Visit. I appreciate your ongoing commitment to your health goals. Please review the following plan we discussed and let me know if I can assist you in the future.   These are the goals we discussed:  Goals      Client understands the importance of follow-up with providers by attending scheduled visits        This is a list of the screening recommended for you and due dates:  Health Maintenance  Topic Date Due   Hepatitis C Screening: USPSTF Recommendation to screen - Ages 62-79 yo.  Never done   Complete foot exam   11/23/2019   COVID-19 Vaccine (8 - 2023-24 season) 06/30/2022   Hemoglobin A1C  09/18/2022   Yearly kidney health urinalysis for diabetes  03/21/2023   Eye exam for diabetics  05/17/2023   Yearly kidney function blood test for diabetes  08/22/2023   Medicare Annual Wellness Visit  10/06/2023   Colon Cancer Screening  09/14/2025   Pneumonia Vaccine  Completed   Flu Shot  Completed   DEXA scan (bone density measurement)  Completed   Zoster (Shingles) Vaccine  Completed   HPV Vaccine  Aged Out   DTaP/Tdap/Td vaccine  Discontinued    Advanced directives: Yes; documents on file.  Conditions/risks identified: Yes  Next appointment: Follow up in one year for your annual wellness visit.   Preventive Care 67 Years and Older, Female Preventive care refers to lifestyle choices and visits with your health care provider that can promote health and wellness. What does preventive care include? A yearly physical exam. This is also called an annual well check. Dental exams once or twice a year. Routine eye exams. Ask your health care provider how often you should have your eyes checked. Personal lifestyle choices, including: Daily care of your teeth and gums. Regular physical activity. Eating a healthy diet. Avoiding tobacco and drug use. Limiting alcohol use. Practicing safe sex. Taking  low-dose aspirin every day. Taking vitamin and mineral supplements as recommended by your health care provider. What happens during an annual well check? The services and screenings done by your health care provider during your annual well check will depend on your age, overall health, lifestyle risk factors, and family history of disease. Counseling  Your health care provider may ask you questions about your: Alcohol use. Tobacco use. Drug use. Emotional well-being. Home and relationship well-being. Sexual activity. Eating habits. History of falls. Memory and ability to understand (cognition). Work and work Statistician. Reproductive health. Screening  You may have the following tests or measurements: Height, weight, and BMI. Blood pressure. Lipid and cholesterol levels. These may be checked every 5 years, or more frequently if you are over 79 years old. Skin check. Lung cancer screening. You may have this screening every year starting at age 64 if you have a 30-pack-year history of smoking and currently smoke or have quit within the past 15 years. Fecal occult blood test (FOBT) of the stool. You may have this test every year starting at age 71. Flexible sigmoidoscopy or colonoscopy. You may have a sigmoidoscopy every 5 years or a colonoscopy every 10 years starting at age 23. Hepatitis C blood test. Hepatitis B blood test. Sexually transmitted disease (STD) testing. Diabetes screening. This is done by checking your blood sugar (glucose) after you have not eaten for a while (fasting). You may have this done every 1-3 years. Bone density scan. This  is done to screen for osteoporosis. You may have this done starting at age 53. Mammogram. This may be done every 1-2 years. Talk to your health care provider about how often you should have regular mammograms. Talk with your health care provider about your test results, treatment options, and if necessary, the need for more tests. Vaccines   Your health care provider may recommend certain vaccines, such as: Influenza vaccine. This is recommended every year. Tetanus, diphtheria, and acellular pertussis (Tdap, Td) vaccine. You may need a Td booster every 10 years. Zoster vaccine. You may need this after age 44. Pneumococcal 13-valent conjugate (PCV13) vaccine. One dose is recommended after age 58. Pneumococcal polysaccharide (PPSV23) vaccine. One dose is recommended after age 30. Talk to your health care provider about which screenings and vaccines you need and how often you need them. This information is not intended to replace advice given to you by your health care provider. Make sure you discuss any questions you have with your health care provider. Document Released: 08/17/2015 Document Revised: 04/09/2016 Document Reviewed: 05/22/2015 Elsevier Interactive Patient Education  2017 [place] Prevention in the Home Falls can cause injuries. They can happen to people of all ages. There are many things you can do to make your home safe and to help prevent falls. What can I do on the outside of my home? Regularly fix the edges of walkways and driveways and fix any cracks. Remove anything that might make you trip as you walk through a door, such as a raised step or threshold. Trim any bushes or trees on the path to your home. Use bright outdoor lighting. Clear any walking paths of anything that might make someone trip, such as rocks or tools. Regularly check to see if handrails are loose or broken. Make sure that both sides of any steps have handrails. Any raised decks and porches should have guardrails on the edges. Have any leaves, snow, or ice cleared regularly. Use sand or salt on walking paths during winter. Clean up any spills in your garage right away. This includes oil or grease spills. What can I do in the bathroom? Use night lights. Install grab bars by the toilet and in the tub and shower. Do not use towel  bars as grab bars. Use non-skid mats or decals in the tub or shower. If you need to sit down in the shower, use a plastic, non-slip stool. Keep the floor dry. Clean up any water that spills on the floor as soon as it happens. Remove soap buildup in the tub or shower regularly. Attach bath mats securely with double-sided non-slip rug tape. Do not have throw rugs and other things on the floor that can make you trip. What can I do in the bedroom? Use night lights. Make sure that you have a light by your bed that is easy to reach. Do not use any sheets or blankets that are too big for your bed. They should not hang down onto the floor. Have a firm chair that has side arms. You can use this for support while you get dressed. Do not have throw rugs and other things on the floor that can make you trip. What can I do in the kitchen? Clean up any spills right away. Avoid walking on wet floors. Keep items that you use a lot in easy-to-reach places. If you need to reach something above you, use a strong step stool that has a grab bar. Keep electrical cords out  of the way. Do not use floor polish or wax that makes floors slippery. If you must use wax, use non-skid floor wax. Do not have throw rugs and other things on the floor that can make you trip. What can I do with my stairs? Do not leave any items on the stairs. Make sure that there are handrails on both sides of the stairs and use them. Fix handrails that are broken or loose. Make sure that handrails are as long as the stairways. Check any carpeting to make sure that it is firmly attached to the stairs. Fix any carpet that is loose or worn. Avoid having throw rugs at the top or bottom of the stairs. If you do have throw rugs, attach them to the floor with carpet tape. Make sure that you have a light switch at the top of the stairs and the bottom of the stairs. If you do not have them, ask someone to add them for you. What else can I do to help  prevent falls? Wear shoes that: Do not have high heels. Have rubber bottoms. Are comfortable and fit you well. Are closed at the toe. Do not wear sandals. If you use a stepladder: Make sure that it is fully opened. Do not climb a closed stepladder. Make sure that both sides of the stepladder are locked into place. Ask someone to hold it for you, if possible. Clearly mark and make sure that you can see: Any grab bars or handrails. First and last steps. Where the edge of each step is. Use tools that help you move around (mobility aids) if they are needed. These include: Canes. Walkers. Scooters. Crutches. Turn on the lights when you go into a dark area. Replace any light bulbs as soon as they burn out. Set up your furniture so you have a clear path. Avoid moving your furniture around. If any of your floors are uneven, fix them. If there are any pets around you, be aware of where they are. Review your medicines with your doctor. Some medicines can make you feel dizzy. This can increase your chance of falling. Ask your doctor what other things that you can do to help prevent falls. This information is not intended to replace advice given to you by your health care provider. Make sure you discuss any questions you have with your health care provider. Document Released: 05/17/2009 Document Revised: 12/27/2015 Document Reviewed: 08/25/2014 Elsevier Interactive Patient Education  2017 [employer].

## 2022-10-06 NOTE — Progress Notes (Signed)
B12 on the lower normal, vitamin B12 is low.  Recommend starting on vitamin B12 1000 mcg daily.  Follow-up with PCP.  Thank you   Thyroid is normal

## 2022-10-06 NOTE — Telephone Encounter (Signed)
Pharmacy Patient Advocate Encounter   Received notification that prior authorization for Prolia '60mg'$  is required/requested.  Per Test Claim: $64.00 with pharmacy benefit   PA submitted on 10/06/22 to (ins) [employer] via CoverMyMeds Key or [facility]) confirmation # [id number] Status is pending

## 2022-10-07 ENCOUNTER — Telehealth: Payer: Self-pay

## 2022-10-07 ENCOUNTER — Ambulatory Visit (INDEPENDENT_AMBULATORY_CARE_PROVIDER_SITE_OTHER): Payer: Medicare PPO

## 2022-10-07 DIAGNOSIS — I421 Obstructive hypertrophic cardiomyopathy: Secondary | ICD-10-CM | POA: Diagnosis not present

## 2022-10-07 LAB — CUP PACEART REMOTE DEVICE CHECK
Battery Impedance: 2266 Ohm
Battery Remaining Longevity: 30 mo
Battery Voltage: 2.74 V
Brady Statistic AP VP Percent: 74 %
Brady Statistic AP VS Percent: 0 %
Brady Statistic AS VP Percent: 26 %
Brady Statistic AS VS Percent: 0 %
Date Time Interrogation Session: 20240305090716
Implantable Lead Connection Status: 753985
Implantable Lead Connection Status: 753985
Implantable Lead Implant Date: 20131210
Implantable Lead Implant Date: 20131210
Implantable Lead Location: 753858
Implantable Lead Location: 753859
Implantable Lead Model: 4296
Implantable Lead Model: 5076
Implantable Pulse Generator Implant Date: 20131210
Lead Channel Impedance Value: 1112 Ohm
Lead Channel Impedance Value: 462 Ohm
Lead Channel Pacing Threshold Amplitude: 0.75 V
Lead Channel Pacing Threshold Amplitude: 0.875 V
Lead Channel Pacing Threshold Pulse Width: 0.4 ms
Lead Channel Pacing Threshold Pulse Width: 0.4 ms
Lead Channel Setting Pacing Amplitude: 2 V
Lead Channel Setting Pacing Amplitude: 2.5 V
Lead Channel Setting Pacing Pulse Width: 0.4 ms
Lead Channel Setting Sensing Sensitivity: 8 mV
Zone Setting Status: 755011
Zone Setting Status: 755011

## 2022-10-07 NOTE — Telephone Encounter (Signed)
Son checking on the status of PA for Trulance.  Please send msg through MyChart or call.

## 2022-10-08 NOTE — Telephone Encounter (Signed)
Patient Advocate Encounter  Prior Authorization for Prolia '60mg'$  has been approved.    PA# [id number] Effective dates: 10/26/19 through 08/04/23

## 2022-10-09 ENCOUNTER — Other Ambulatory Visit: Payer: Self-pay | Admitting: Internal Medicine

## 2022-10-09 DIAGNOSIS — K5904 Chronic idiopathic constipation: Secondary | ICD-10-CM

## 2022-10-09 DIAGNOSIS — F02B11 Dementia in other diseases classified elsewhere, moderate, with agitation: Secondary | ICD-10-CM

## 2022-10-09 MED ORDER — LINACLOTIDE 72 MCG PO CAPS: 72.0000 ug | ORAL_CAPSULE | Freq: Every day | ORAL | 1 refills | Status: DC

## 2022-10-09 NOTE — Telephone Encounter (Signed)
Per encounter from February, pt must try Linzess and Amitiza  Please be advised we currently do not have a Pharmacist to review denials, therefore you will need to process appeals accordingly as needed. Thanks for your support at this time.   You may call [phone] to appeal.

## 2022-10-13 ENCOUNTER — Encounter: Payer: Self-pay | Admitting: Internal Medicine

## 2022-10-13 ENCOUNTER — Other Ambulatory Visit: Payer: Self-pay | Admitting: Internal Medicine

## 2022-10-13 ENCOUNTER — Ambulatory Visit: Payer: Medicare PPO | Attending: Cardiovascular Disease | Admitting: Cardiovascular Disease

## 2022-10-13 ENCOUNTER — Encounter: Payer: Self-pay | Admitting: Cardiovascular Disease

## 2022-10-13 VITALS — BP 128/74 | HR 68 | Ht 63.0 in | Wt 160.0 lb

## 2022-10-13 DIAGNOSIS — I480 Paroxysmal atrial fibrillation: Secondary | ICD-10-CM | POA: Diagnosis not present

## 2022-10-13 DIAGNOSIS — F332 Major depressive disorder, recurrent severe without psychotic features: Secondary | ICD-10-CM

## 2022-10-13 DIAGNOSIS — Z950 Presence of cardiac pacemaker: Secondary | ICD-10-CM | POA: Diagnosis not present

## 2022-10-13 DIAGNOSIS — I5032 Chronic diastolic (congestive) heart failure: Secondary | ICD-10-CM

## 2022-10-13 DIAGNOSIS — I421 Obstructive hypertrophic cardiomyopathy: Secondary | ICD-10-CM

## 2022-10-13 DIAGNOSIS — F02C11 Dementia in other diseases classified elsewhere, severe, with agitation: Secondary | ICD-10-CM

## 2022-10-13 MED ORDER — BREXPIPRAZOLE 0.25 MG PO TABS: 0.2500 mg | ORAL_TABLET | Freq: Every day | ORAL | 0 refills | Status: DC

## 2022-10-13 NOTE — Progress Notes (Signed)
Electrophysiology Office Note:    Date:  10/13/2022   ID:  Holly Simmons, DOB 10/03/1940, MRN [id number]  PCP:  Janith Lima, MD   [place] [facility] Cardiologist:  Jenkins Rouge, MD     Referring MD: Janith Lima, MD   History of Present Illness:    Holly Simmons is a 82 y.o. female with a hx listed below, significant for HCM s/p complicated septal myectomy, tricuspid valve replacement, pacemaker, persistent atrial fibrillation and atypical atrial flutter who presents for follow-up.  She has an abdominal dual chamber epicardial pacemaker placed December 2012 at the time of her myomectomy, programmed VVI 40.  There is also a transvenous dual-chamber pacemaker placed in the left pectoral region programmed DDDR 60-120.  she has no device related complaints -- no new tenderness, drainage, redness. She does not have palpitations or paroxysmal fatigue.   Past Medical History:  Diagnosis Date   Allergic rhinitis    Asthma    NOS w/ acute exacerbation   Blood transfusion without reported diagnosis    CKD (chronic kidney disease) stage 3, GFR 30-59 ml/min (HCC) 09/12/2018   Colon polyps    TUBULAR ADENOMAS AND HYPERPLASTIC   Complete heart block (HCC)    requiring PPM (MDT) post surgical myomectomy at [facility],  leads are epicardial with abdominal implant, high ventricular threshold at implant   COPD (chronic obstructive pulmonary disease) (HCC)    Depressive disorder    Diastolic dysfunction    DM (diabetes mellitus) ([place])    Gallstones    GERD (gastroesophageal reflux disease)    Gout 08/20/2012   Heart murmur    Hyperpotassemia    Hypersomnia    Hypertension    Hypertrophic cardiomyopathy ([place])    s/p surgical myomectomy at [facility] 0000000 complicated by septal VSD post procedure requiring reoperation with patch closure and tricuspid valve replacement   Kidney stones    Myocardial infarction ([place]) 2011   Obstructive sleep apnea    persistent daytime  sleepiness despite cpap   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   Psoriasis    Pyuria    Renal insufficiency    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   [place]    Past Surgical History:  Procedure Laterality Date   ABDOMINAL HYSTERECTOMY     APPENDECTOMY     BREAST CYST EXCISION     CARDIOVERSION N/A 08/01/2014   Procedure: CARDIOVERSION;  Surgeon: PThayer Headings MD;  [facility]  Service: Cardiovascular;  Laterality: N/A;   CESAREAN SECTION     CHOLECYSTECTOMY     COLONOSCOPY     EYE SURGERY  01/16/2016   left eye lens implant   EYE SURGERY  01/09/2016   right eye lens implant   INSERT / REPLACE / REMOVE PACEMAKER  07/13/2012   PACEMAKER INSERTION  07/02/2011   [facility]  high ventricular lead threshold at implant per Dr HWestley Gambles  PERMANENT PACEMAKER INSERTION N/A 07/13/2012   Procedure: PERMANENT PACEMAKER INSERTION;  Surgeon: JThompson Grayer MD;  [facility];  Service: Cardiovascular;  Laterality: N/A;   POLYPECTOMY     septal myomectomy for hypertrophic CM  [facility]23/2011   [facility]SD requiring patch repair and tricuspid valve replacement   TONSILLECTOMY     TOOTH EXTRACTION     TRICUSPID VALVE REPLACEMENT  06/2011   Medtronic 243mMosaic tissue valve  VSD REPAIR  06/2011    Current Medications: Current Meds  Medication Sig   allopurinol (ZYLOPRIM) 100 MG tablet Take 1 tablet by mouth daily.   amoxicillin (AMOXIL) 500 MG capsule TAKE 4 CAPSULES 1 HOUR PRIOR TO PROCEDURE   Betamethasone Valerate 0.12 % foam APPLY TO SCALP AS DIRECTED. (Patient taking differently: 1 application  once a week.)   brexpiprazole (REXULTI) 0.25 MG TABS tablet Take 1 tablet (0.25 mg total) by mouth daily.   buPROPion (WELLBUTRIN XL) 300 MG 24 hr tablet TAKE ONE TABLET BY MOUTH ONCE DAILY   dronabinol (MARINOL) 5 MG capsule Take 1 capsule by mouth two times daily before  lunch and supper.   famotidine (PEPCID) 40 MG tablet Take 1 tablet by mouth daily.   ferrous sulfate 325 (65 FE) MG tablet Take 1 tablet (325 mg total) by mouth 2 (two) times daily with a meal. (Patient taking differently: Take 325-650 mg by mouth daily with breakfast.)   HYDROcodone bit-homatropine (HYCODAN) 5-1.5 MG/5ML syrup Take 5 mLs by mouth every 8 (eight) hours as needed for cough.   ipratropium (ATROVENT) 0.03 % nasal spray USE 2 SPRAYS IN EACH NOSTRIL 3 TIMES A DAY AS NEEDED. (Patient taking differently: Place 2 sprays into both nostrils daily as needed (nose bleed).)   lamoTRIgine (LAMICTAL) 25 MG tablet TAKE ONE TABLET BY MOUTH ONCE DAILY   linaclotide (LINZESS) 72 MCG capsule Take 1 capsule (72 mcg total) by mouth daily before breakfast.   metoprolol tartrate (LOPRESSOR) 25 MG tablet TAKE ONE TABLET BY MOUTH TWICE DAILY   modafinil (PROVIGIL) 100 MG tablet Take 1 tablet (100 mg total) by mouth daily. (Patient taking differently: Take 100 mg by mouth daily as needed (sleep disorder).)   Pitavastatin Calcium 2 MG TABS Take 1 tablet (2 mg total) by mouth daily.   PRESCRIPTION MEDICATION 1 Dose by Other route at bedtime as needed (sleep). CPAP   Propylene Glycol (SYSTANE BALANCE OP) Place 1 drop into both eyes 4 (four) times daily as needed (dry eyes).   SYMBICORT 80-4.5 MCG/ACT inhaler USE 2 PUFFS TWICE A DAY. (Patient taking differently: 2 puffs 2 (two) times daily.)   torsemide (DEMADEX) 20 MG tablet Take 1 tablet (20 mg total) by mouth daily.   Vilazodone HCl (VIIBRYD) 40 MG TABS Take 1 tablet by mouth daily.   warfarin (COUMADIN) 1 MG tablet Take 1 tablet (1 mg total) by mouth daily. TAKE 1 1/2 TABLET EVERY WEDNESDAY OR AS DIRECTED BY ANTICOAGULATION LCINIC   warfarin (COUMADIN) 2.5 MG tablet TAKE 1 TABLET BY MOUTH DAILY EXCEPT TAKE 1/2 TABLET ON WEDNESDAYS OR AS DIRECTED BY ANTICOAGULATION CLINIC     Allergies:   Petrolatum-zinc oxide; Sulfa antibiotics; Sulfonamide derivatives;  Tetanus toxoid; Tetanus toxoids; Other; Nsaids; and Tetanus toxoid, adsorbed   Social and Family History: Reviewed in Epic  ROS:   Please see the history of present illness.    All other systems reviewed and are negative.  EKGs/Labs/Other Studies Reviewed Today:    Cardiac Studies & Procedures       ECHOCARDIOGRAM  ECHOCARDIOGRAM COMPLETE 03/19/2022  Narrative ECHOCARDIOGRAM REPORT    Patient Name:   Holly Simmons Date of Exam: 03/19/2022 Medical Rec #:  [id number]            Height:       63.0 in Accession #:    [id number]           Weight:       156.5 lb Date of Birth:  [birth year]           BSA:          1.742 m Patient Age:    78 years             BP:           143/63 mmHg Patient Gender: F                    HR:           68 bpm. Exam Location:  Inpatient  Procedure: 2D Echo, Cardiac Doppler and Color Doppler  Indications:    Murmur  History:        Patient has prior history of Echocardiogram examinations, most recent 06/14/2021. COPD; Risk Factors:Hypertension, Diabetes and Sleep Apnea.  Sonographer:    Jefferey Pica Referring Phys: [facility]   1. Left ventricular ejection fraction, by estimation, is 50 to 55%. The left ventricle has low normal function. The left ventricle has no regional wall motion abnormalities. There is mild asymmetric left ventricular hypertrophy of the posterior wall segment. Left ventricular diastolic parameters are indeterminate. 2. Right ventricular systolic function is normal. The right ventricular size is normal. There is moderately elevated pulmonary artery systolic pressure. 3. Left atrial size was mildly dilated. 4. Right atrial size was severely dilated. 5. The mitral valve is degenerative. No evidence of mitral valve regurgitation. Mild mitral stenosis. Moderate mitral annular calcification. 6. Bioprosthetic tricuspid valve. TV mean gradient 7.7 mmHg suggesting stenosis of the the  bioprosthetic valve. 7. The aortic valve is calcified. Aortic valve regurgitation is mild. Mild aortic valve stenosis. Aortic regurgitation PHT measures 377 msec. Aortic valve mean gradient measures 9.0 mmHg. Aortic valve Vmax measures 2.08 m/s. 8. The inferior vena cava is normal in size with greater than 50% respiratory variability, suggesting right atrial pressure of 3 mmHg.  FINDINGS Left Ventricle: Left ventricular ejection fraction, by estimation, is 50 to 55%. The left ventricle has low normal function. The left ventricle has no regional wall motion abnormalities. The left ventricular internal cavity size was normal in size. There is mild asymmetric left ventricular hypertrophy of the posterior wall segment. Left ventricular diastolic parameters are indeterminate.  Right Ventricle: The right ventricular size is normal. No increase in right ventricular wall thickness. Right ventricular systolic function is normal. There is moderately elevated pulmonary artery systolic pressure. The tricuspid regurgitant velocity is 2.76 m/s, and with an assumed right atrial pressure of 15 mmHg, the estimated right ventricular systolic pressure is [id number] mmHg.  Left Atrium: Left atrial size was mildly dilated.  Right Atrium: Right atrial size was severely dilated.  Pericardium: There is no evidence of pericardial effusion.  Mitral Valve: The mitral valve is degenerative in appearance. Moderate mitral annular calcification. No evidence of mitral valve regurgitation. Mild mitral valve stenosis.  Tricuspid Valve: Bioprosthetic tricuspid valve. TV mean gradient 7.7 mmHg suggesting stenosis of the the bioprosthetic valve. Tricuspid valve regurgitation is not demonstrated.  Aortic Valve: The aortic valve is calcified. Aortic valve regurgitation is mild. Aortic regurgitation PHT measures 377 msec. Mild aortic stenosis is present. Aortic valve mean gradient measures 9.0 mmHg. Aortic valve peak gradient measures 17.4  mmHg. Aortic valve area, by VTI measures 2.87 cm.  Pulmonic Valve: The pulmonic valve was not well visualized. Pulmonic valve regurgitation is not visualized. No evidence of pulmonic stenosis.  Aorta: The aortic root is normal in size and structure.  Venous: The inferior vena cava is normal  in size with greater than 50% respiratory variability, suggesting right atrial pressure of 3 mmHg.  IAS/Shunts: No atrial level shunt detected by color flow Doppler.   LEFT VENTRICLE PLAX 2D LVIDd:         5.00 cm   Diastology LVIDs:         3.80 cm   LV e' medial:    3.09 cm/s LV PW:         1.10 cm   LV E/e' medial:  39.5 LV IVS:        0.80 cm   LV e' lateral:   5.06 cm/s LVOT diam:     2.00 cm   LV E/e' lateral: 24.1 LV SV:         135 LV SV Index:   78 LVOT Area:     3.14 cm   IVC IVC diam: 2.50 cm  LEFT ATRIUM             Index        RIGHT ATRIUM           Index LA diam:        4.30 cm 2.47 cm/m   RA Area:     23.40 cm LA Vol (A2C):   59.4 ml 34.09 ml/m  RA Volume:   80.30 ml  46.09 ml/m LA Vol (A4C):   66.8 ml 38.34 ml/m LA Biplane Vol: 64.5 ml 37.02 ml/m AORTIC VALVE                     PULMONIC VALVE AV Area (Vmax):    3.09 cm      PV Vmax:       0.88 m/s AV Area (Vmean):   2.89 cm      PV Peak grad:  3.1 mmHg AV Area (VTI):     2.87 cm AV Vmax:           208.50 cm/s AV Vmean:          137.000 cm/s AV VTI:            0.471 m AV Peak Grad:      17.4 mmHg AV Mean Grad:      9.0 mmHg LVOT Vmax:         205.00 cm/s LVOT Vmean:        126.000 cm/s LVOT VTI:          0.431 m LVOT/AV VTI ratio: 0.92 AI PHT:            377 msec  AORTA Ao Root diam: 3.30 cm Ao Asc diam:  2.70 cm  MITRAL VALVE                TRICUSPID VALVE MV Area (PHT): 3.34 cm     TV Peak grad:   12.1 mmHg MV Decel Time: 227 msec     TV Mean grad:   7.7 mmHg MV E velocity: 122.00 cm/s  TV Vmax:        1.74 m/s MV A velocity: 94.70 cm/s   TV Vmean:       132.0 cm/s MV E/A ratio:  1.29          TV VTI:         0.83 msec TR Peak grad:   30.5 mmHg TR Vmax:        276.00 cm/s  SHUNTS Systemic VTI:  0.43 m Systemic Diam: 2.00 cm  Godfrey Pick Tobb DO Electronically signed by Berniece Salines  DO Signature Date/Time: 03/19/2022/4:05:25 PM    Final             EKG:  Last EKG results: today -- A-V paced   Recent Labs: 03/18/2022: ALT 12; Magnesium 2.0 07/01/2022: Pro B Natriuretic peptide (BNP) 431.0 08/21/2022: BUN 25; Creatinine 1.8; Potassium 4.7; Sodium 141 09/12/2022: Hemoglobin 11.7; Platelets 136.0 10/06/2022: TSH 2.67     Physical Exam:    VS:  BP 128/74   Pulse 68   Ht '5\' 3"'$  (1.6 m)   Wt 160 lb (72.6 kg)   SpO2 96%   BMI 28.34 kg/m     Wt Readings from Last 3 Encounters:  10/13/22 160 lb (72.6 kg)  10/06/22 161 lb (73 kg)  10/06/22 161 lb (73 kg)     GEN:  Well nourished, well developed in no acute distress CARDIAC: RRR, no murmurs, rubs, gallops The device siteS are normal -- no tenderness, edema, drainage, redness, threatened erosion. RESPIRATORY:  Normal work of breathing MUSCULOSKELETAL: no edema    ASSESSMENT & PLAN:    1.  Complete heart block Doing very well s/p PPM Of note, she has a second abdominal device which is programmed VVI backup   2. Persistent afib/ atypical atria flutter Asymptomatic AF burden is 10.5% (previously 21%) She is on coumadin   3. HTN Stable No change required today   4. Hypertrophic CM/ valvular heart disease On coumadin Echo 10/20 reviewed S/p myomectomy and TV replacement for complicated HOCM   5. Chronic renal insufficiency, stage IIIb  (creat 2.6-->1.3) Stable No change required today        Medication Adjustments/Labs and Tests Ordered: Current medicines are reviewed at length with the patient today.  Concerns regarding medicines are outlined above.  No orders of the defined types were placed in this encounter.  No orders of the defined types were placed in this encounter.    Signed, Melida Quitter, MD  10/13/2022 9:28 AM    [place]

## 2022-10-13 NOTE — Patient Instructions (Addendum)
Medication Instructions:  Your physician recommends that you continue on your current medications as directed. Please refer to the Current Medication list given to you today.  *If you need a refill on your cardiac medications before your next appointment, please call your pharmacy*  Lab Work: None ordered.  If you have labs (blood work) drawn today and your tests are completely normal, you will receive your results only by: [place] (if you have MyChart) OR A paper copy in the mail If you have any lab test that is abnormal or we need to change your treatment, we will call you to review the results.  Testing/Procedures: None ordered.  Follow-Up: At [facility], you and your health needs are our priority.  As part of our continuing mission to provide you with exceptional heart care, we have created designated Provider Care Teams.  These Care Teams include your primary Cardiologist (physician) and Advanced Practice Providers (APPs -  Physician Assistants and Nurse Practitioners) who all work together to provide you with the care you need, when you need it.   Your next appointment:   1 year(s) with EP APP   The format for your next appointment:   In Person  Provider:  Doralee Albino, MD  {or one of the following Advanced Practice Providers on your designated Care Team:   Tommye Standard, [place] Legrand Como "[facility]" [place], [place]

## 2022-10-14 NOTE — Telephone Encounter (Signed)
Pt ready for scheduling on or after 10/14/22   Out-of-pocket cost due at time of visit: $0   Primary: [employer] [place] co-insurance: 0% Admin fee co-insurance: $0   Secondary: n/a [employer] co-insurance:  Admin fee co-insurance:    Deductible: does not apply   Prior Auth: APPROVED PA# [id number]  Valid:  10/26/19 through 08/04/23      ** This summary of benefits is an estimation of the patient's out-of-pocket cost. Exact cost may very based on individual plan coverage.

## 2022-10-15 ENCOUNTER — Ambulatory Visit: Payer: Medicare PPO | Attending: Physician Assistant

## 2022-10-15 DIAGNOSIS — T17320A Food in larynx causing asphyxiation, initial encounter: Secondary | ICD-10-CM | POA: Diagnosis not present

## 2022-10-15 DIAGNOSIS — R41841 Cognitive communication deficit: Secondary | ICD-10-CM | POA: Diagnosis not present

## 2022-10-15 DIAGNOSIS — R1310 Dysphagia, unspecified: Secondary | ICD-10-CM | POA: Diagnosis not present

## 2022-10-15 DIAGNOSIS — W44F3XA Food entering into or through a natural orifice, initial encounter: Secondary | ICD-10-CM | POA: Diagnosis not present

## 2022-10-15 NOTE — Therapy (Signed)
OUTPATIENT SPEECH LANGUAGE PATHOLOGY SWALLOW EVALUATION   Patient Name: Holly Simmons MRN: [id number] DOB:Oct 20, 1940, 82 y.o., female Today's Date: 10/15/2022  PCP: Scarlette Calico, Carlean Jews  REFERRING PROVIDER: Sherrilyn Rist, E. PA-C  END OF SESSION:  End of Session - 10/15/22 1309     Visit Number 1    Number of Visits 8    Date for SLP Re-Evaluation 12/14/22    SLP Start Time 0936    SLP Stop Time  1017    SLP Time Calculation (min) 41 min    Activity Tolerance Patient tolerated treatment well             Past Medical History:  Diagnosis Date   Allergic rhinitis    Asthma    NOS w/ acute exacerbation   Blood transfusion without reported diagnosis    CKD (chronic kidney disease) stage 3, GFR 30-59 ml/min (HCC) 09/12/2018   Colon polyps    TUBULAR ADENOMAS AND HYPERPLASTIC   Complete heart block (HCC)    requiring PPM (MDT) post surgical myomectomy at [facility],  leads are epicardial with abdominal implant, high ventricular threshold at implant   COPD (chronic obstructive pulmonary disease) (HCC)    Depressive disorder    Diastolic dysfunction    DM (diabetes mellitus) ([place])    Gallstones    GERD (gastroesophageal reflux disease)    Gout 08/20/2012   Heart murmur    Hyperpotassemia    Hypersomnia    Hypertension    Hypertrophic cardiomyopathy ([place])    s/p surgical myomectomy at [facility] 0000000 complicated by septal VSD post procedure requiring reoperation with patch closure and tricuspid valve replacement   Kidney stones    Myocardial infarction ([place]) 2011   Obstructive sleep apnea    persistent daytime sleepiness despite cpap   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   Psoriasis    Pyuria    Renal insufficiency    Tricuspid valve replaced    MDT 36m Mosaic Valve   [place] ([place] 9/15   Past Surgical History:  Procedure Laterality Date   ABDOMINAL HYSTERECTOMY     APPENDECTOMY     BREAST CYST EXCISION     CARDIOVERSION N/A 08/01/2014   Procedure: CARDIOVERSION;   Surgeon: PThayer Headings MD;  [place]  Service: Cardiovascular;  Laterality: N/A;   CESAREAN SECTION     CHOLECYSTECTOMY     COLONOSCOPY     EYE SURGERY  01/16/2016   left eye lens implant   EYE SURGERY  01/09/2016   right eye lens implant   INSERT / REPLACE / REMOVE PACEMAKER  07/13/2012   PACEMAKER INSERTION  07/02/2011   [facility]  high ventricular lead threshold at implant per Dr HWestley Gambles  PERMANENT PACEMAKER INSERTION N/A 07/13/2012   Procedure: PERMANENT PACEMAKER INSERTION;  Surgeon: JThompson Grayer MD;  [facility];  Service: Cardiovascular;  Laterality: N/A;   POLYPECTOMY     septal myome[facility] for hypertrophic CM  [facility]23/2011   [facility] requiring patch repair and tricuspid valve replacement   TONSILLECTOMY     TOOTH EXTRACTION     TRICUSPID VALVE REPLACEMENT  06/2011   Medtronic 223mMosaic tissue valve   VSD REPAIR  06/2011   Patient Active Problem List   Diagnosis Date Noted   Dementia ([place]/30/2024   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   Severe episode of recurrent major depressive disorder, without psychotic features ([place]/26/2023  PAF (paroxysmal atrial fibrillation) ([place]) 08/31/2021   Thrombocytopenia ([place]) 12/04/2020   Encounter for general adult medical examination with abnormal findings 07/12/2020   Cachexia ([place]) 08/16/2019   Iron deficiency anemia due to chronic blood loss 09/23/2018   Stage 3b chronic kidney disease (CKD) ([place]) 09/12/2018   Microalbuminuria due to type 2 diabetes mellitus ([place]) 04/21/2018   Obesity (BMI 30.0-34.9) 02/05/2016   Osteoporosis with pathological fracture of ankle and foot 05/30/2015   Encounter for therapeutic drug monitoring 11/22/2014   Gout 08/20/2012   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 06/10/2012   Other screening mammogram 02/04/2012   Hyperlipidemia LDL goal <130 12/04/2011   VSD (ventricular septal defect and  aortic arch hypoplasia 09/25/2011   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   Long term (current) use of anticoagulants 07/18/2011   Hypertrophic obstructive cardiomyopathy ([place]) 05/22/2010   Hypersomnia 08/16/2009   Chronic diastolic heart failure ([place]) 08/25/2008   GERD 08/10/2008   Obstructive sleep apnea 08/10/2007   Allergic rhinitis 08/09/2007    ONSET DATE: > 6 months ago   REFERRING DIAG: Choking  THERAPY DIAG:  Dysphagia, unspecified type   Cognitive communication deficit   Rationale for Evaluation and Treatment: Rehabilitation  SUBJECTIVE:   SUBJECTIVE STATEMENT: "Bread is one of the worst things I try to swallow." Pt accompanied by: family member daughter [name]  PERTINENT HISTORY: history of hypertension, hyperlipidemia, multiple cardiology issues including pacemaker placement, TV replacement, chronic anticoagulation on Coumadin, thrombocytopenia, aortic stenosis, CKD, insomnia , seen today for evaluation of memory loss.  She has a strong family history of Alzheimer's disease.  MoCA today is 5/30.  She is dependent on several ADLs, and she also was noted by daughter to have some swallowing difficulties.  All these findings, are suspicious for dementia likely due to Alzheimer's disease.  She is also on Rexulti for agitation as per PCP.  She had a CT of the head on August 2023, remarkable for cerebral atrophy, chronic microvascular disease.  No acute findings were noted.  There is no indication to repeat the testing.  PAIN:  Are you having pain? No  FALLS: Has patient fallen in last 6 months?  No  LIVING ENVIRONMENT: Lives with: lives with their family Lives in: House/apartment  PLOF:  Level of assistance: Independent with ADLs, Needed assistance with ADLs, Needed assistance with IADLS Employment: Retired  PATIENT GOALS: Improve ease of swallowing  OBJECTIVE:   DIAGNOSTIC FINDINGS:  CT ANGIO HEAD NECK [place] (03/18/22) IMPRESSION: 1. No intracranial  large vessel occlusion or significant stenosis 2. Mild atherosclerotic disease carotid bifurcation bilaterally without stenosis. 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 3. Both vertebral arteries widely patent 4. 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  Pt had upper GI March 2022 IMPRESSION: 1. Within the limitations of today's examination, the examination was normal without explanation for patient's history of dysphagia.    COGNITION: Overall cognitive status: History of cognitive impairments - at baseline Areas of impairment:  Memory: Impaired: Short term Ideational apraxia  ORAL MOTOR EXAMINATION: Overall status: Impaired: Labial: Bilateral (Coordination) Lingual: Bilateral (Coordination) Comments: coordination was reduced in oral-motor tasks (oral-nonverbal apraxic like)but no apraxic component to pt's speech production observed today  CLINICAL SWALLOW ASSESSMENT:   Current diet: regular and thin liquids Dentition: adequate natural dentition Patient directly observed with POs: Yes: dysphagia 3 (soft), dysphagia 1 (puree), and thin liquids  Feeding: able to feed self Liquids provided by: cup Oral phase signs and symptoms:  prolonged mastication; pt and daughter stated his was WNL for pt Pharyngeal phase signs and symptoms:  none seen today - pt had fig bar, applesauce, and water.  SLP assessed pt current eating situation with report from pt and largely supplemented by daughter [name]. SLP suggested drier and more dense foods be moistened with gravies, sauces, and condiments and that pt begin meal with 3-4 sips of water prior to solids. SLP also suggested pt take small bites and sips and that she alternate bite/sip in 1:1 ratio.  The option of MBS was discussed and found that pt would like to try these modifications before going for an objective swallow study. The possibility was  not completely ruled out.  PATIENT REPORTED OUTCOME MEASURES (PROM): EAT-10: to be completed next session   TODAY'S TREATMENT:                                                                                                                                         DATE:  10/15/22 (Eval): SLP discussed results of BSE and the recommendations (see pt instructions)  PATIENT EDUCATION: Education details: see "today's treatment" and "pt instructions" Person educated: Patient and Child(ren) Education method: Explanation, Demonstration, Verbal cues, and Handouts Education comprehension: verbalized understanding, returned demonstration, verbal cues required, and needs further education   ASSESSMENT:  CLINICAL IMPRESSION: Patient is a 82 y.o. F who was seen today for clinical assessment of swallowing. Pt and daughter report pt with difficulty of pharyngeal clearance with bread and more dense meats which are mostly grilled. SLP suggested modifications to pt's food preparation (see notes above) and addition of condiments, gravies, and sauces. Pt and daughter would like to try these modifications and see results before pt would be referred for MBS/FEES. SLP to work with pt and family for compensation usage and possibly initiating basic swallowing exercises (i.e., effortful swallow).  OBJECTIVE IMPAIRMENTS: include memory and dysphagia. These impairments are limiting patient from managing medications, managing appointments, managing finances, household responsibilities, ADLs/IADLs, and safety when swallowing. Factors affecting potential to achieve goals and functional outcome are ability to learn/carryover information, co-morbidities, and medical prognosis. Patient will benefit from skilled SLP services to address above impairments and improve overall function.  REHAB POTENTIAL: Good   GOALS: Goals reviewed with patient? No   LONG TERM GOALS: Target date: 12/14/22  Pt will follow swallow precautions  with POs with occasional min A x2 sessions Baseline:  Goal status: INITIAL  2.  Pt family will cue pt correctly and appropriately with POs PRN x2 sessions Baseline:  Goal status: INITIAL  3.  If pt interested, she will undergo objective swallow assessment (MBS/FEES) with goals developed PRN Baseline:  Goal status: INITIAL  4.  Pt/family will indicate using at least 2 strategies to aid pharyngeal transit Baseline:  Goal status: INITIAL  5.  Pt will score higher on EAT-10 than on original administration Baseline:  Goal status:  INITIAL   PLAN:  SLP FREQUENCY: 1x/week  SLP DURATION: 8 weeks  PLANNED INTERVENTIONS: Aspiration precaution training, Pharyngeal strengthening exercises, Diet toleration management , Environmental controls, Trials of upgraded texture/liquids, Internal/external aids, Compensatory strategies, Patient/family education, and objective swallow eval    Lawson Mahone, CCC-SLP 10/15/2022, 5:21 PM

## 2022-10-15 NOTE — Patient Instructions (Signed)
  When you eat: Begin all meals with 3-4 drinks of water Alternate bites and drinks in 1:1 ratio Small bites and sips  =================================== Remember sauces and gravies help more dense and dry foods to get down Stewed meats are usually easier to eat than broiled or grilled  Put medication in applesauce

## 2022-10-16 ENCOUNTER — Encounter: Payer: Self-pay | Admitting: Internal Medicine

## 2022-10-23 ENCOUNTER — Ambulatory Visit
Admission: EM | Admit: 2022-10-23 | Discharge: 2022-10-23 | Disposition: A | Discharge status: Home / Self Care | Payer: Medicare PPO | Attending: Urgent Care | Admitting: Urgent Care

## 2022-10-23 DIAGNOSIS — K644 Residual hemorrhoidal skin tags: Secondary | ICD-10-CM

## 2022-10-23 DIAGNOSIS — K625 Hemorrhage of anus and rectum: Secondary | ICD-10-CM

## 2022-10-23 DIAGNOSIS — Z7901 Long term (current) use of anticoagulants: Secondary | ICD-10-CM

## 2022-10-23 MED ORDER — HYDROCORTISONE ACETATE 25 MG RE SUPP: 25.0000 mg | Freq: Two times a day (BID) | RECTAL | 0 refills | Status: DC

## 2022-10-23 NOTE — ED Provider Notes (Signed)
[facility] - URGENT CARE CENTER  Note:  This document was prepared using Systems analyst and may include unintentional dictation errors.  MRN: [id number] DOB: Jun 17, 1941  Subjective:   Holly Simmons is a 82 y.o. female presenting for 21-month history of persistent intermittent perianal bleeding.  No fever, nausea, vomiting, abdominal pain, perianal pain, masses.  No history of hemorrhoids to their knowledge.  Patient had a recent colonoscopy and was negative for hemorrhoids.  They did not recommend further screenings.  She is anticoagulated on Coumadin.  They have previously suspected it might be related to this, checked her INR levels and are within normal limits.  She has been seen multiple times for the same symptoms.  No current facility-administered medications for this encounter.  Current Outpatient Medications:    brexpiprazole (REXULTI) 0.25 MG TABS tablet, Take 1 tablet (0.25 mg total) by mouth daily., Disp: 30 tablet, Rfl: 0   allopurinol (ZYLOPRIM) 100 MG tablet, Take 1 tablet by mouth daily., Disp: 90 tablet, Rfl: 1   amoxicillin (AMOXIL) 500 MG capsule, TAKE 4 CAPSULES 1 HOUR PRIOR TO PROCEDURE, Disp: 30 capsule, Rfl: 0   Betamethasone Valerate 0.12 % foam, APPLY TO SCALP AS DIRECTED. (Patient taking differently: 1 application  once a week.), Disp: 100 g, Rfl: 2   buPROPion (WELLBUTRIN XL) 300 MG 24 hr tablet, TAKE ONE TABLET BY MOUTH ONCE DAILY, Disp: 90 tablet, Rfl: 0   dronabinol (MARINOL) 5 MG capsule, Take 1 capsule by mouth two times daily before lunch and supper., Disp: 180 capsule, Rfl: 1   famotidine (PEPCID) 40 MG tablet, Take 1 tablet by mouth daily., Disp: 90 tablet, Rfl: 1   ferrous sulfate 325 (65 FE) MG tablet, Take 1 tablet (325 mg total) by mouth 2 (two) times daily with a meal. (Patient taking differently: Take 325-650 mg by mouth daily with breakfast.), Disp: 180 tablet, Rfl: 1   HYDROcodone bit-homatropine (HYCODAN) 5-1.5 MG/5ML  syrup, Take 5 mLs by mouth every 8 (eight) hours as needed for cough., Disp: , Rfl:    ipratropium (ATROVENT) 0.03 % nasal spray, USE 2 SPRAYS IN EACH NOSTRIL 3 TIMES A DAY AS NEEDED. (Patient taking differently: Place 2 sprays into both nostrils daily as needed (nose bleed).), Disp: 30 mL, Rfl: 0   lamoTRIgine (LAMICTAL) 25 MG tablet, TAKE ONE TABLET BY MOUTH ONCE DAILY, Disp: 90 tablet, Rfl: 0   linaclotide (LINZESS) 72 MCG capsule, Take 1 capsule (72 mcg total) by mouth daily before breakfast., Disp: 90 capsule, Rfl: 1   metoprolol tartrate (LOPRESSOR) 25 MG tablet, TAKE ONE TABLET BY MOUTH TWICE DAILY, Disp: 180 tablet, Rfl: 1   modafinil (PROVIGIL) 100 MG tablet, Take 1 tablet (100 mg total) by mouth daily. (Patient taking differently: Take 100 mg by mouth daily as needed (sleep disorder).), Disp: 30 tablet, Rfl: 5   Pitavastatin Calcium 2 MG TABS, Take 1 tablet (2 mg total) by mouth daily., Disp: 90 tablet, Rfl: 1   PRESCRIPTION MEDICATION, 1 Dose by Other route at bedtime as needed (sleep). CPAP, Disp: , Rfl:    Propylene Glycol (SYSTANE BALANCE OP), Place 1 drop into both eyes 4 (four) times daily as needed (dry eyes)., Disp: , Rfl:    SYMBICORT 80-4.5 MCG/ACT inhaler, USE 2 PUFFS TWICE A DAY. (Patient taking differently: 2 puffs 2 (two) times daily.), Disp: 30.6 g, Rfl: 5   torsemide (DEMADEX) 20 MG tablet, Take 1 tablet (20 mg total) by mouth daily., Disp: 90 tablet, Rfl: 1  Vilazodone HCl (VIIBRYD) 40 MG TABS, Take 1 tablet by mouth daily., Disp: 90 tablet, Rfl: 1   warfarin (COUMADIN) 1 MG tablet, Take 1 tablet (1 mg total) by mouth daily. TAKE 1 1/2 TABLET EVERY WEDNESDAY OR AS DIRECTED BY ANTICOAGULATION LCINIC, Disp: 20 tablet, Rfl: 0   warfarin (COUMADIN) 2.5 MG tablet, TAKE 1 TABLET BY MOUTH DAILY EXCEPT TAKE 1/2 TABLET ON WEDNESDAYS OR AS DIRECTED BY ANTICOAGULATION CLINIC, Disp: 180 tablet, Rfl: 1   Allergies  Allergen Reactions   Petrolatum-Zinc Oxide Anaphylaxis, Rash and  Swelling   Sulfa Antibiotics Rash   Sulfonamide Derivatives Anaphylaxis and Swelling   Tetanus Toxoid     Other reaction(s): Other (See Comments) OTHER REACTION   Tetanus Toxoids Swelling   Other Rash and Other (See Comments)    STERI - STRIPS  It can affect proteins in her kidneys so she doesn't take REACTION: proteinuria Other reaction(s): Other (See Comments), Unknown It can affect proteins in her kidneys so she doesn't take   Nsaids Other (See Comments)    REACTION: proteinuria Other reaction(s): Other (See Comments), Unknown It can affect proteins in her kidneys so she doesn't take    Tetanus Toxoid, Adsorbed Other (See Comments)    Other reaction(s): Other (See Comments) Turned arm red Turned arm red    Past Medical History:  Diagnosis Date   Allergic rhinitis    Asthma    NOS w/ acute exacerbation   Blood transfusion without reported diagnosis    CKD (chronic kidney disease) stage 3, GFR 30-59 ml/min (HCC) 09/12/2018   Colon polyps    TUBULAR ADENOMAS AND HYPERPLASTIC   Complete heart block (HCC)    requiring PPM (MDT) post surgical myomectomy at [facility],  leads are epicardial with abdominal implant, high ventricular threshold at implant   COPD (chronic obstructive pulmonary disease) (HCC)    Depressive disorder    Diastolic dysfunction    DM (diabetes mellitus) ([place])    Gallstones    GERD (gastroesophageal reflux disease)    Gout 08/20/2012   Heart murmur    Hyperpotassemia    Hypersomnia    Hypertension    Hypertrophic cardiomyopathy ([place])    s/p surgical myomectomy at [facility] 0000000 complicated by septal VSD post procedure requiring reoperation with patch closure and tricuspid valve replacement   Kidney stones    Myocardial infarction ([place]) 2011   Obstructive sleep apnea    persistent daytime sleepiness despite cpap   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   Psoriasis    Pyuria    Renal insufficiency    Tricuspid valve replaced    MDT 31mm Mosaic Valve   Typical atrial flutter  ([place]) 9/15     Past Surgical History:  Procedure Laterality Date   ABDOMINAL HYSTERECTOMY     APPENDECTOMY     BREAST CYST EXCISION     CARDIOVERSION N/A 08/01/2014   Procedure: CARDIOVERSION;  Surgeon: Thayer Headings, MD;  Location: [place];  Service: Cardiovascular;  Laterality: N/A;   CESAREAN SECTION     CHOLECYSTECTOMY     COLONOSCOPY     EYE SURGERY  01/16/2016   left eye lens implant   EYE SURGERY  01/09/2016   right eye lens implant   INSERT / REPLACE / REMOVE PACEMAKER  07/13/2012   PACEMAKER INSERTION  07/02/2011   epicardial wires with abdominal implant at [facility] 12/12,  high ventricular lead threshold at implant per Dr Westley Gambles   PERMANENT PACEMAKER INSERTION N/A 07/13/2012   Procedure: PERMANENT PACEMAKER INSERTION;  Surgeon: Thompson Grayer, MD;  Location: [facility] C[facility] LAB;  Service: Cardiovascular;  Laterality: N/A;   POLYPECTOMY     septal myomectomy for hypertrophic CM  06/23/2011   by Dr Evelina Dun [facility], complicated by septal VSD requiring patch repair and tricuspid valve replacement   TONSILLECTOMY     TOOTH EXTRACTION     TRICUSPID VALVE REPLACEMENT  06/2011   Medtronic 46mm Mosaic tissue valve   VSD REPAIR  06/2011    Family History  Problem Relation Age of Onset   Hypertension Daughter    Heart disease Maternal Grandfather    Heart disease Paternal Grandfather    Bladder Cancer Father    Prostate cancer Father    Cancer Father    Varicose Veins Father    Heart attack Father    Breast cancer Mother    Dementia Mother    Hypertension Mother    Cancer Mother    Ovarian cancer Maternal Aunt    Pancreatic cancer Cousin    Breast cancer Paternal Aunt    Dementia Maternal Aunt        x 2   Dementia Maternal Uncle        x 2   Colon cancer Neg Hx    Rectal cancer Neg Hx    Stomach cancer Neg Hx    Esophageal cancer Neg Hx     Social History   Tobacco Use   Smoking status: Never   Smokeless tobacco: Never  Vaping Use   Vaping Use: Never used   Substance Use Topics   Alcohol use: No    Alcohol/week: 0.0 standard drinks of alcohol   Drug use: No    ROS   Objective:   Vitals: There were no vitals taken for this visit.  Physical Exam Constitutional:      General: She is not in acute distress.    Appearance: Normal appearance. She is well-developed. She is not ill-appearing, toxic-appearing or diaphoretic.  HENT:     Head: Normocephalic and atraumatic.     Nose: Nose normal.     Mouth/Throat:     Mouth: Mucous membranes are moist.  Eyes:     General: No scleral icterus.       Right eye: No discharge.        Left eye: No discharge.     Extraocular Movements: Extraocular movements intact.  Cardiovascular:     Rate and Rhythm: Normal rate.  Pulmonary:     Effort: Pulmonary effort is normal.  Genitourinary:   Skin:    General: Skin is warm and dry.  Neurological:     General: No focal deficit present.     Mental Status: She is alert and oriented to person, place, and time.  Psychiatric:        Mood and Affect: Mood normal.        Behavior: Behavior normal.     Assessment and Plan :   PDMP not reviewed this encounter.  1. External hemorrhoids without complication   2. Anticoagulated on Coumadin   3. Anal bleeding    Patient has obvious to resolving external hemorrhoids.  They are thrombosed but are open and bleeding lightly.  Given the size and the fact that there open and resolving on their own I do not recommend further hemorrhoidectomy.  I also advised that the bleeding may be related to chronic anticoagulation with Coumadin.  They will follow-up with their PCP and gastroenterologist.  I did recommend using hydrocortisone suppositories.  Follow-up  with [facility] as well for further consultation and management should she continue to have external hemorrhoids.   Jaynee Eagles, [place] 10/24/22 1036

## 2022-10-23 NOTE — ED Triage Notes (Addendum)
Pt presents with rectal bleeding. Reports that for the last 2-3 months it happens randomly. Today it happened and it was the worst its ever been. Blood is bright red blood. No hx of hemorrhoids. Denies any pain. When it happened a couple months ago pcp checked INR and it was normal.

## 2022-10-27 ENCOUNTER — Ambulatory Visit: Payer: Medicare PPO

## 2022-10-27 DIAGNOSIS — R1310 Dysphagia, unspecified: Secondary | ICD-10-CM

## 2022-10-27 DIAGNOSIS — R41841 Cognitive communication deficit: Secondary | ICD-10-CM | POA: Diagnosis not present

## 2022-10-27 DIAGNOSIS — T17320A Food in larynx causing asphyxiation, initial encounter: Secondary | ICD-10-CM | POA: Diagnosis not present

## 2022-10-27 NOTE — Patient Instructions (Signed)
   MAKE SURE YOU FINISH CHEWING AND SWALLOWING YOUR FOOD BEFORE YOU  TAKE A SIP OF WATER

## 2022-10-27 NOTE — Therapy (Signed)
OUTPATIENT SPEECH LANGUAGE PATHOLOGY SWALLOW TREATMENT   Patient Name: Holly Simmons MRN: [id number] DOB:25-May-1941, 82 y.o., female Today's Date: 10/27/2022  PCP: Scarlette Calico, Carlean Jews  REFERRING PROVIDER: Sherrilyn Rist, E. PA-C  END OF SESSION:  End of Session - 10/27/22 1239     Visit Number 2    Number of Visits 8    Date for SLP Re-Evaluation 12/14/22    SLP Start Time 1234    SLP Stop Time  [id number]    SLP Time Calculation (min) 41 min    Activity Tolerance Patient tolerated treatment well              Past Medical History:  Diagnosis Date   Allergic rhinitis    Asthma    NOS w/ acute exacerbation   Blood transfusion without reported diagnosis    CKD (chronic kidney disease) stage 3, GFR 30-59 ml/min (HCC) 09/12/2018   Colon polyps    TUBULAR ADENOMAS AND HYPERPLASTIC   Complete heart block (HCC)    requiring PPM (MDT) post surgical myomectomy at [facility],  leads are epicardial with abdominal implant, high ventricular threshold at implant   COPD (chronic obstructive pulmonary disease) (HCC)    Depressive disorder    Diastolic dysfunction    DM (diabetes mellitus) ([place])    Gallstones    GERD (gastroesophageal reflux disease)    Gout 08/20/2012   Heart murmur    Hyperpotassemia    Hypersomnia    Hypertension    Hypertrophic cardiomyopathy ([place])    s/p surgical myomectomy at [facility] 0000000 complicated by septal VSD post procedure requiring reoperation with patch closure and tricuspid valve replacement   Kidney stones    Myocardial infarction ([place]) 2011   Obstructive sleep apnea    persistent daytime sleepiness despite cpap   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   Psoriasis    Pyuria    Renal insufficiency    Tricuspid valve replaced    MDT 20mm Mosaic Valve   Typical atrial flutter ([place]) 9/15   Past Surgical History:  Procedure Laterality Date   ABDOMINAL HYSTERECTOMY     APPENDECTOMY     BREAST CYST EXCISION     CARDIOVERSION N/A 08/01/2014   Procedure: CARDIOVERSION;   Surgeon: Thayer Headings, MD;  Location: [place];  Service: Cardiovascular;  Laterality: N/A;   CESAREAN SECTION     CHOLECYSTECTOMY     COLONOSCOPY     EYE SURGERY  01/16/2016   left eye lens implant   EYE SURGERY  01/09/2016   right eye lens implant   INSERT / REPLACE / REMOVE PACEMAKER  07/13/2012   PACEMAKER INSERTION  07/02/2011   epicardial wires with abdominal implant at [facility] 12/12,  high ventricular lead threshold at implant per Dr Westley Gambles   PERMANENT PACEMAKER INSERTION N/A 07/13/2012   Procedure: PERMANENT PACEMAKER INSERTION;  Surgeon: Thompson Grayer, MD;  Location: [facility] CATH LAB;  Service: Cardiovascular;  Laterality: N/A;   POLYPECTOMY     septal myomectomy for hypertrophic CM  06/23/2011   by Dr Evelina Dun at [facility], complicated by septal VSD requiring patch repair and tricuspid valve replacement   TONSILLECTOMY     TOOTH EXTRACTION     TRICUSPID VALVE REPLACEMENT  06/2011   Medtronic 59mm Mosaic tissue valve   VSD REPAIR  06/2011   Patient Active Problem List   Diagnosis Date Noted   Dementia ([place]) 09/02/2022   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   Severe episode of recurrent major depressive disorder, without psychotic features ([place]) 02/26/2022  PAF (paroxysmal atrial fibrillation) ([place]) 08/31/2021   Thrombocytopenia ([place]) 12/04/2020   Encounter for general adult medical examination with abnormal findings 07/12/2020   Cachexia ([place]) 08/16/2019   Iron deficiency anemia due to chronic blood loss 09/23/2018   Stage 3b chronic kidney disease (CKD) ([place]) 09/12/2018   Microalbuminuria due to type 2 diabetes mellitus ([place]) 04/21/2018   Obesity (BMI 30.0-34.9) 02/05/2016   Osteoporosis with pathological fracture of ankle and foot 05/30/2015   Encounter for therapeutic drug monitoring 11/22/2014   Gout 08/20/2012   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 06/10/2012   Other screening mammogram 02/04/2012   Hyperlipidemia LDL goal <130 12/04/2011   VSD (ventricular septal defect and  aortic arch hypoplasia 09/25/2011   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   Long term (current) use of anticoagulants 07/18/2011   Hypertrophic obstructive cardiomyopathy ([place]) 05/22/2010   Hypersomnia 08/16/2009   Chronic diastolic heart failure ([place]) 08/25/2008   GERD 08/10/2008   Obstructive sleep apnea 08/10/2007   Allergic rhinitis 08/09/2007    ONSET DATE: > 6 months ago   REFERRING DIAG: Choking  THERAPY DIAG:  Dysphagia, unspecified type   Cognitive communication deficit   Rationale for Evaluation and Treatment: Rehabilitation  SUBJECTIVE:   SUBJECTIVE STATEMENT: "I can't recall an episode she's had in the last 2 weeks."  Pt accompanied by: family member daughter [name]  PERTINENT HISTORY: history of hypertension, hyperlipidemia, multiple cardiology issues including pacemaker placement, TV replacement, chronic anticoagulation on Coumadin, thrombocytopenia, aortic stenosis, CKD, insomnia , seen today for evaluation of memory loss.  She has a strong family history of Alzheimer's disease.  MoCA today is 5/30.  She is dependent on several ADLs, and she also was noted by daughter to have some swallowing difficulties.  All these findings, are suspicious for dementia likely due to Alzheimer's disease.  She is also on Rexulti for agitation as per PCP.  She had a CT of the head on August 2023, remarkable for cerebral atrophy, chronic microvascular disease.  No acute findings were noted.  There is no indication to repeat the testing.  PAIN:  Are you having pain? No  PATIENT GOALS: Improve ease of swallowing  OBJECTIVE:   DIAGNOSTIC FINDINGS:  CT ANGIO HEAD NECK W WO CONTRAST (03/18/22) IMPRESSION: 1. No intracranial large vessel occlusion or significant stenosis 2. Mild atherosclerotic disease carotid bifurcation bilaterally without stenosis. 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 3. Both vertebral arteries widely patent 4. 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  Pt had upper GI March 2022 IMPRESSION: 1. Within the limitations of today's examination, the examination was normal without explanation for patient's history of dysphagia.    PATIENT REPORTED OUTCOME MEASURES (PROM): EAT-10: Kendra rated 11/40 (worse QOL = higher score); with nothing above a "2", and pt rated 15/40. "3" for swallowing pills, pleasure of eating is decr'd, and food sticking in throat (#s5,7,8)   TODAY'S TREATMENT:  DATE:  10/27/22: Pt's medical POA (according to daughter) does not wish pt to have MBS. Pt stated she has noticed no difference in swallowing/choking since last session however [name] states that pt has not had a choking/coughing episode in the lats two weeks; She and son are reminding pt to follow precautions.  Today pt completed 3 effortful swallows and [name] said that she could assist pt with this. She and pt will report back and if necessary SLP to provide another exercise.EAT-10 was completed today with results above.  Pt ate fig bar with [name] cueing [place] appropriately, SLP added that pt should finish bite of food in her mouth prior to taking sip, and [place] remembered each bite after that (x5). SLP to add another exercise next session if effortful swallow went well.   10/15/22 (Eval): SLP discussed results of BSE and the recommendations (see pt instructions)  PATIENT EDUCATION: Education details: see "today's treatment" and "pt instructions" Person educated: Patient and Child(ren) Education method: Explanation, Demonstration, Verbal cues, and Handouts Education comprehension: verbalized understanding, returned demonstration, verbal cues required, and needs further education   ASSESSMENT:  CLINICAL IMPRESSION: Patient is a  82 y.o. F who was seen today for swallowing treatment. Daughter reports no "episodes" in the lats two weeks with her and pt's son cueing pt to follow recommended precautions. SLP educated pt and [name] with effortful swallow today. SLP also targeted modifications to pt's PO intake. Pt and daughter would like to try these modifications and see results before pt would be referred for MBS/FEES.   OBJECTIVE IMPAIRMENTS: include memory and dysphagia. These impairments are limiting patient from managing medications, managing appointments, managing finances, household responsibilities, ADLs/IADLs, and safety when swallowing. Factors affecting potential to achieve goals and functional outcome are ability to learn/carryover information, co-morbidities, and medical prognosis. Patient will benefit from skilled SLP services to address above impairments and improve overall function.  REHAB POTENTIAL: Good   GOALS: Goals reviewed with patient? No   LONG TERM GOALS: Target date: 12/14/22  Pt will follow swallow precautions with POs with occasional min A x2 sessions Baseline: 10/27/22 Goal status: Ongoing  2.  Pt family will cue pt correctly and appropriately with POs PRN x2 sessions Baseline: 10/27/22 Goal status: Ongoing  3.  If pt interested, she will undergo objective swallow assessment (MBS/FEES) with goals developed PRN Baseline:  Goal status: Deferred - pt medical POA does not want pt to do so at this time  4.  Pt/family will indicate using at least 2 strategies to aid pharyngeal transit Baseline:  Goal status: Ongoing  5.  Pt will score higher on EAT-10 than on original administration Baseline:  Goal status: Ongoing   PLAN:  SLP FREQUENCY: 1x/week  SLP DURATION: 8 weeks  PLANNED INTERVENTIONS: Aspiration precaution training, Pharyngeal strengthening exercises, Diet toleration management , Environmental controls, Trials of upgraded texture/liquids, Internal/external aids, Compensatory  strategies, Patient/family education, and objective swallow eval    Lillar Bianca, CCC-SLP 10/27/2022, 2:47 PM

## 2022-11-04 ENCOUNTER — Telehealth: Payer: Self-pay

## 2022-11-04 ENCOUNTER — Ambulatory Visit: Payer: Medicare PPO

## 2022-11-04 DIAGNOSIS — Z7901 Long term (current) use of anticoagulants: Secondary | ICD-10-CM | POA: Diagnosis not present

## 2022-11-04 LAB — POCT INR: INR: 4.5 — AB (ref 2.0–3.0)

## 2022-11-04 NOTE — Patient Instructions (Addendum)
Pre visit review using our clinic review tool, if applicable. No additional management support is needed unless otherwise documented below in the visit note.  Hold dose today and hold dose tomorrow and the change weekly dose to take 1 tablet daily except take 1/2 tablet on Mondays, Wednesdays and Fridays. Recheck in 2 weeks.

## 2022-11-04 NOTE — Progress Notes (Signed)
ER on 3/21; note from ER; Holly Simmons is a 82 y.o. female presenting for 58-month history of persistent intermittent perianal bleeding.  No fever, nausea, vomiting, abdominal pain, perianal pain, masses.  No history of hemorrhoids to their knowledge.  Patient had a recent colonoscopy and was negative for hemorrhoids.  They did not recommend further screenings.  She is anticoagulated on Coumadin.  They have previously suspected it might be related to this, checked her INR levels and are within normal limits.  She has been seen multiple times for the same symptoms. Pt finished hydrocortisone suppositories, BID, about 1 week ago. She used them for 1 week.   Hold dose today and hold dose tomorrow and the change weekly dose to take 1 tablet daily except take 1/2 tablet on Mondays, Wednesdays and Fridays. Recheck in 2 weeks.  Pt and pt's son, Holly Simmons, deny any changes and deny any bleeding. Advised if any s/s of abnormal bruising or bleeding go to ER. Holly Simmons verbalized understanding.

## 2022-11-04 NOTE — Telephone Encounter (Signed)
  Nona Dell, CPhT  Pharmacy Technician Pharmacy   Telephone Encounter Signed   Encounter Date: 05/04/2021   Signed      Pt ready for scheduling on or after 10/14/22   Out-of-pocket cost due at time of visit: $0   Primary: [employer] Prolia co-insurance: 0% Admin fee co-insurance: $0   Secondary: n/a Prolia co-insurance:  Admin fee co-insurance:    Deductible: does not apply   Prior Auth: APPROVED PA# [id number]  Valid:  10/26/19 through 08/04/23      ** This summary of benefits is an estimation of the patient's out-of-pocket cost. Exact cost may very based on individual plan coverage.         Electronically signed by Nona Dell, CPhT at 10/14/2022  3:24 PM

## 2022-11-04 NOTE — Telephone Encounter (Signed)
Pt in coumadin clinic today for INR check. Pt's son, [name], asked if pt is due for Prolia. Chart reports last Prolia given on 04/04/22. Lenny Pastel reports they received a letter from insurance reporting Prolia has been authorized. Advised a msg would be sent to clinical staff to f/u on this and if she is due it can be given at her next coumadin clinic apt in 2 weeks, 4/16.

## 2022-11-04 NOTE — Telephone Encounter (Signed)
Will provide pt with Prolia injection at next coumadin clinic apt.

## 2022-11-06 ENCOUNTER — Other Ambulatory Visit: Payer: Self-pay | Admitting: Internal Medicine

## 2022-11-06 DIAGNOSIS — F3341 Major depressive disorder, recurrent, in partial remission: Secondary | ICD-10-CM

## 2022-11-06 DIAGNOSIS — F418 Other specified anxiety disorders: Secondary | ICD-10-CM

## 2022-11-10 ENCOUNTER — Ambulatory Visit: Payer: Medicare PPO | Attending: Physician Assistant

## 2022-11-10 DIAGNOSIS — R1310 Dysphagia, unspecified: Secondary | ICD-10-CM | POA: Insufficient documentation

## 2022-11-10 DIAGNOSIS — R41841 Cognitive communication deficit: Secondary | ICD-10-CM | POA: Diagnosis not present

## 2022-11-10 NOTE — Therapy (Signed)
OUTPATIENT SPEECH LANGUAGE PATHOLOGY SWALLOW TREATMENT/DISCHARGE SUMMARY   Patient Name: Holly Simmons MRN: [id number] DOB:[birth year], 82 y.o., female Today's Date: 11/10/2022  PCP: Sanda LingerJones, Thomas, Elbert EwingsL  REFERRING PROVIDER: Durenda AgeWertman, Sarah, E. PA-C  END OF SESSION:  End of Session - 11/10/22 1246     Visit Number 3    Number of Visits 8    Date for SLP Re-Evaluation 12/14/22    SLP Start Time 1243   10 minutes late   SLP Stop Time  1315    SLP Time Calculation (min) 32 min    Activity Tolerance Patient tolerated treatment well              Past Medical History:  Diagnosis Date   Allergic rhinitis    Asthma    NOS w/ acute exacerbation   Blood transfusion without reported diagnosis    CKD (chronic kidney disease) stage 3, GFR 30-59 ml/min 09/12/2018   Colon polyps    TUBULAR ADENOMAS AND HYPERPLASTIC   Complete heart block    requiring PPM (MDT) [facility],  leads are epicardial with abdominal implant, high ventricular threshold at implant   COPD (chronic obstructive pulmonary disease)    Depressive disorder    Diastolic dysfunction    DM (diabetes mellitus)    Gallstones    GERD (gastroesophageal reflux disease)    Gout 08/20/2012   Heart murmur    Hyperpotassemia    Hypersomnia    Hypertension    Hypertrophic cardiomyopathy    [facility] complicated by septal VSD post procedure requiring reoperation with patch closure and tricuspid valve replacement   Kidney stones    Myocardial infarction 2011   Obstructive sleep apnea    persistent daytime sleepiness despite cpap   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   Psoriasis    Pyuria    Renal insufficiency    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   Typical atrial flutter 9/15   Past Surgical History:  Procedure Laterality Date   ABDOMINAL HYSTERECTOMY     APPENDECTOMY     BREAST CYST EXCISION     CARDIOVERSION N/A 08/01/2014   Procedure: CARDIOVERSION;  Surgeon:  Vesta MixerPhilip J Nahser, MD;  [facility];  Service: Cardiovascular;  Laterality: N/A;   CESAREAN SECTION     CHOLECYSTECTOMY     COLONOSCOPY     EYE SURGERY  01/16/2016   left eye lens implant   EYE SURGERY  01/09/2016   right eye lens implant   INSERT / REPLACE / REMOVE PACEMAKER  07/13/2012   PACEMAKER INSERTION  07/02/2011   [facility],  high ventricular lead threshold at implant per Dr Christin FudgeHegland   PERMANENT PACEMAKER INSERTION N/A 07/13/2012   Procedure: PERMANENT PACEMAKER INSERTION;  Surgeon: Hillis RangeJames Allred, MD;  [facility];  Service: Cardiovascular;  Laterality: N/A;   POLYPECTOMY     septal myomectomy for hypertrophic CM  06/23/2011   by [facility], complicated by septal VSD requiring patch repair and tricuspid valve replacement   TONSILLECTOMY     TOOTH EXTRACTION     TRICUSPID VALVE REPLACEMENT  06/2011   Medtronic 27mm Mosaic tissue valve   VSD REPAIR  06/2011   Patient Active Problem List   Diagnosis Date Noted   Dementia 09/02/2022   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   Severe episode of recurrent major depressive disorder, without psychotic features 02/26/2022   PAF (paroxysmal atrial  fibrillation) 08/31/2021   Thrombocytopenia 12/04/2020   Encounter for general adult medical examination with abnormal findings 07/12/2020   Cachexia 08/16/2019   Iron deficiency anemia due to chronic blood loss 09/23/2018   Stage 3b chronic kidney disease (CKD) 09/12/2018   Microalbuminuria due to type 2 diabetes mellitus 04/21/2018   Obesity (BMI 30.0-34.9) 02/05/2016   Osteoporosis with pathological fracture of ankle and foot 05/30/2015   Encounter for therapeutic drug monitoring 11/22/2014   Gout 08/20/2012   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 06/10/2012   Other screening mammogram 02/04/2012   Hyperlipidemia LDL goal <130 12/04/2011   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   Tricuspid valve  regurgitation 08/13/2011   Long term (current) use of anticoagulants 07/18/2011   Hypertrophic obstructive cardiomyopathy 05/22/2010   Hypersomnia 08/16/2009   Chronic diastolic heart failure 08/25/2008   GERD 08/10/2008   Obstructive sleep apnea 08/10/2007   Allergic rhinitis 08/09/2007   SPEECH THERAPY DISCHARGE SUMMARY  Visits from Start of Care: 3  Current functional level related to goals / functional outcomes: See below.   Remaining deficits: See below   Education / Equipment: See below   Patient agrees to discharge. Patient goals were partially met. Patient is being discharged due to being pleased with the current functional level..    ONSET DATE: > 6 months ago   REFERRING DIAG: Choking  THERAPY DIAG:  Dysphagia, unspecified type   Cognitive communication deficit   Rationale for Evaluation and Treatment: Rehabilitation  SUBJECTIVE:   SUBJECTIVE STATEMENT: "There hasn't been anything major." Holly Simmons)  Pt accompanied by: family member daughter Holly Simmons  PERTINENT HISTORY: history of hypertension, hyperlipidemia, multiple cardiology issues including pacemaker placement, TV replacement, chronic anticoagulation on Coumadin, thrombocytopenia, aortic stenosis, CKD, insomnia , seen today for evaluation of memory loss.  She has a strong family history of Alzheimer's disease.  MoCA today is 5/30.  She is dependent on several ADLs, and she also was noted by daughter to have some swallowing difficulties.  All these findings, are suspicious for dementia likely due to Alzheimer's disease.  She is also on Rexulti for agitation as per PCP.  She had a CT of the head on August 2023, remarkable for cerebral atrophy, chronic microvascular disease.  No acute findings were noted.  There is no indication to repeat the testing.  PAIN:  Are you having pain? No  PATIENT GOALS: Improve ease of swallowing  OBJECTIVE:   DIAGNOSTIC FINDINGS:  CT ANGIO HEAD NECK W WO CONTRAST  (03/18/22) IMPRESSION: 1. No intracranial large vessel occlusion or significant stenosis 2. Mild atherosclerotic disease carotid bifurcation bilaterally without stenosis. 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 3. Both vertebral arteries widely patent 4. 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  Pt had upper GI March 2022 IMPRESSION: 1. Within the limitations of today's examination, the examination was normal without explanation for patient's history of dysphagia.    PATIENT REPORTED OUTCOME MEASURES (PROM): EAT-10: Kendra rated 11/40 (worse QOL = higher score); with nothing above a "2", and pt rated 15/40. "3" for swallowing pills, pleasure of eating is decr'd, and food sticking in throat (#s5,7,8)   TODAY'S TREATMENT:  DATE:  11/10/22: Pt inconsistent with HEP so SLP modeled for Holly Simmons how to complete with pt . Pt req'd usual cues for smaller sip for effortful swallow; will keep this exercise for now, and Holly Simmons knows to cue for effortful swallow.  Today with cereal bar and water Kendra cued pt appropriately. Pt and Holly Simmons are comfortable with discharge at this time. SLP reviewed action to take if pt has incr'd coughing with POs and what might be possible conclusions from a MBS.   10/27/22: Pt's medical POA (according to daughter) does not wish pt to have MBS. Pt stated she has noticed no difference in swallowing/choking since last session however Holly Simmons states that pt has not had a choking/coughing episode in the lats two weeks; She and son are reminding pt to follow precautions.  Today pt completed 3 effortful swallows and Holly Simmons said that she could assist pt with this. She and pt will report back and if necessary SLP to provide another exercise.EAT-10 was  completed today with results above.  Pt ate fig bar with Holly Simmons cueing [place] appropriately, SLP added that pt should finish bite of food in her mouth prior to taking sip, and [place] remembered each bite after that (x5). SLP to add another exercise next session if effortful swallow went well.   10/15/22 (Eval): SLP discussed results of BSE and the recommendations (see pt instructions)  PATIENT EDUCATION: Education details: see "today's treatment" and "pt instructions" Person educated: Patient and Child(ren) Education method: see "today's treatment" Education comprehension: verbalized understanding, returned demonstration, verbal cues required   ASSESSMENT:  CLINICAL IMPRESSION: Patient is a 82 y.o. F who was seen today for swallowing treatment. Daughter cont'd to report no "episodes" in the last two weeks with her and she and son cont cueing pt to follow recommended precautions. SLP reviewed, and modeled assisting pt with effortful swallow today. .Even though pt left prior to being administered EAT-10 Holly Simmons stated no events in the last 4+ weeks.  OBJECTIVE IMPAIRMENTS: include memory and dysphagia. These impairments are limiting patient from managing medications, managing appointments, managing finances, household responsibilities, ADLs/IADLs, and safety when swallowing. Factors affecting potential to achieve goals and functional outcome are ability to learn/carryover information, co-morbidities, and medical prognosis. Patient will benefit from skilled SLP services to address above impairments and improve overall function.  REHAB POTENTIAL: Good   GOALS: Goals reviewed with patient? No   LONG TERM GOALS: Target date: 12/14/22  Pt will follow swallow precautions with POs with occasional min A x2 sessions Baseline: 10/27/22 Goal status: Met  2.  Pt family will cue pt correctly and appropriately with POs PRN x2 sessions Baseline: 10/27/22 Goal status: Met  3.  If pt interested, she  will undergo objective swallow assessment (MBS/FEES) with goals developed PRN Baseline:  Goal status: Deferred - pt medical POA does not want pt to do so at this time  4.  Pt/family will indicate using at least 2 strategies to aid pharyngeal transit Baseline:  Goal status: Met  5.  Pt will score higher on EAT-10 than on original administration Baseline:  Goal status: Deferred - pt left prior to administration   PLAN:  SLP FREQUENCY: 1x/week  SLP DURATION: 8 weeks  PLANNED INTERVENTIONS: Aspiration precaution training, Pharyngeal strengthening exercises, Diet toleration management , Environmental controls, Trials of upgraded texture/liquids, Internal/external aids, Compensatory strategies, Patient/family education, and objective swallow eval    Grove Defina, CCC-SLP 11/10/2022, 12:46 PM

## 2022-11-14 NOTE — Progress Notes (Signed)
Remote pacemaker transmission.   

## 2022-11-18 ENCOUNTER — Ambulatory Visit: Payer: Medicare PPO

## 2022-11-18 DIAGNOSIS — M810 Age-related osteoporosis without current pathological fracture: Secondary | ICD-10-CM

## 2022-11-18 DIAGNOSIS — Z7901 Long term (current) use of anticoagulants: Secondary | ICD-10-CM

## 2022-11-18 LAB — POCT INR: INR: 2.3 (ref 2.0–3.0)

## 2022-11-18 MED ORDER — DENOSUMAB 60 MG/ML ~~LOC~~ SOSY
60.0000 mg | PREFILLED_SYRINGE | Freq: Once | SUBCUTANEOUS | Status: AC
  Administered 2022-11-18: 60 mg via SUBCUTANEOUS

## 2022-11-18 NOTE — Progress Notes (Addendum)
Continue 1 tablet daily except take 1/2 tablet on Mondays, Wednesdays and Fridays. Recheck in 3 weeks.  Pt's son reports pt had a fall on 4/14, no injuries and pt did not hit head. Advised to see PCP to assess. Apt was made for tomorrow to see PCP.  Pt due for Prolia today. Per orders of Dr. Yetta Barre, injection of Prolia given by Sherrie George, RN. Patient tolerated injection well. Administered Prolia in L upper arm. Pt tolerated well.

## 2022-11-18 NOTE — Patient Instructions (Addendum)
Pre visit review using our clinic review tool, if applicable. No additional management support is needed unless otherwise documented below in the visit note.  Continue 1 tablet daily except take 1/2 tablet on Mondays, Wednesdays and Fridays. Recheck in 3 weeks. 

## 2022-11-19 ENCOUNTER — Encounter: Payer: Self-pay | Admitting: Internal Medicine

## 2022-11-19 ENCOUNTER — Ambulatory Visit: Payer: Medicare PPO | Admitting: Internal Medicine

## 2022-11-19 VITALS — BP 136/86 | HR 85 | Temp 98.3°F | Ht 63.0 in | Wt 163.0 lb

## 2022-11-19 DIAGNOSIS — D500 Iron deficiency anemia secondary to blood loss (chronic): Secondary | ICD-10-CM | POA: Diagnosis not present

## 2022-11-19 DIAGNOSIS — K5904 Chronic idiopathic constipation: Secondary | ICD-10-CM

## 2022-11-19 DIAGNOSIS — D696 Thrombocytopenia, unspecified: Secondary | ICD-10-CM

## 2022-11-19 DIAGNOSIS — N1832 Chronic kidney disease, stage 3b: Secondary | ICD-10-CM

## 2022-11-19 LAB — BASIC METABOLIC PANEL
BUN: 32 mg/dL — ABNORMAL HIGH (ref 6–23)
CO2: 28 mEq/L (ref 19–32)
Calcium: 10.4 mg/dL (ref 8.4–10.5)
Chloride: 103 mEq/L (ref 96–112)
Creatinine, Ser: 1.78 mg/dL — ABNORMAL HIGH (ref 0.40–1.20)
GFR: 26.86 mL/min — ABNORMAL LOW (ref >60.00)
Glucose, Bld: 104 mg/dL — ABNORMAL HIGH (ref 70–99)
Potassium: 4.6 mEq/L (ref 3.5–5.1)
Sodium: 138 mEq/L (ref 135–145)

## 2022-11-19 LAB — CBC WITH DIFFERENTIAL/PLATELET
Basophils Absolute: 0 10*3/uL (ref 0.0–0.1)
Basophils Relative: 0.7 % (ref 0.0–3.0)
Eosinophils Absolute: 0.3 10*3/uL (ref 0.0–0.7)
Eosinophils Relative: 5.3 % — ABNORMAL HIGH (ref 0.0–5.0)
HCT: 34.5 % — ABNORMAL LOW (ref 36.0–46.0)
Hemoglobin: 11.3 g/dL — ABNORMAL LOW (ref 12.0–15.0)
Lymphocytes Relative: 11.4 % — ABNORMAL LOW (ref 12.0–46.0)
Lymphs Abs: 0.6 10*3/uL — ABNORMAL LOW (ref 0.7–4.0)
MCHC: 32.6 g/dL (ref 30.0–36.0)
MCV: 94.8 fl (ref 78.0–100.0)
Monocytes Absolute: 0.6 10*3/uL (ref 0.1–1.0)
Monocytes Relative: 10.2 % (ref 3.0–12.0)
Neutro Abs: 4.1 10*3/uL (ref 1.4–7.7)
Neutrophils Relative %: 72.4 % (ref 43.0–77.0)
Platelets: 127 10*3/uL — ABNORMAL LOW (ref 150.0–400.0)
RBC: 3.64 Mil/uL — ABNORMAL LOW (ref 3.87–5.11)
RDW: 16.1 % — ABNORMAL HIGH (ref 11.5–15.5)
WBC: 5.6 10*3/uL (ref 4.0–10.5)

## 2022-11-19 LAB — IBC + FERRITIN
Ferritin: 56.2 ng/mL (ref 10.0–291.0)
Iron: 93 ug/dL (ref 42–145)
Saturation Ratios: 27.6 % (ref 20.0–50.0)
TIBC: 337.4 ug/dL (ref 250.0–450.0)
Transferrin: 241 mg/dL (ref 212.0–360.0)

## 2022-11-19 NOTE — Progress Notes (Signed)
Subjective:  Patient ID: Holly Simmons, female    DOB: 08-Sep-1940  Age: 82 y.o. MRN: [id number]  CC: Anemia   HPI AZA DANTES presents for f/up ---  She is becoming more confused and disoriented.  About 3 days ago she was found on the floor in the kitchen.  She had gotten separated from her walker.  She was on the ground for about a minute when her daughter found her.  She does not feel like she injured herself.  Outpatient Medications Prior to Visit  Medication Sig Dispense Refill   allopurinol (ZYLOPRIM) 100 MG tablet Take 1 tablet by mouth daily. 90 tablet 1   amoxicillin (AMOXIL) 500 MG capsule TAKE 4 CAPSULES 1 HOUR PRIOR TO PROCEDURE 30 capsule 0   Betamethasone Valerate 0.12 % foam APPLY TO SCALP AS DIRECTED. (Patient taking differently: 1 application  once a week.) 100 g 2   brexpiprazole (REXULTI) 0.25 MG TABS tablet Take 1 tablet (0.25 mg total) by mouth daily. 30 tablet 0   buPROPion (WELLBUTRIN XL) 300 MG 24 hr tablet TAKE ONE TABLET BY MOUTH ONCE DAILY 90 tablet 0   dronabinol (MARINOL) 5 MG capsule Take 1 capsule by mouth two times daily before lunch and supper. 180 capsule 1   famotidine (PEPCID) 40 MG tablet Take 1 tablet by mouth daily. 90 tablet 1   ferrous sulfate 325 (65 FE) MG tablet Take 1 tablet (325 mg total) by mouth 2 (two) times daily with a meal. (Patient taking differently: Take 325-650 mg by mouth daily with breakfast.) 180 tablet 1   HYDROcodone bit-homatropine (HYCODAN) 5-1.5 MG/5ML syrup Take 5 mLs by mouth every 8 (eight) hours as needed for cough.     hydrocortisone (ANUSOL-HC) 25 MG suppository Place 1 suppository (25 mg total) rectally 2 (two) times daily. 14 suppository 0   ipratropium (ATROVENT) 0.03 % nasal spray USE 2 SPRAYS IN EACH NOSTRIL 3 TIMES A DAY AS NEEDED. (Patient taking differently: Place 2 sprays into both nostrils daily as needed (nose bleed).) 30 mL 0   lamoTRIgine (LAMICTAL) 25 MG tablet TAKE ONE TABLET BY MOUTH ONCE  DAILY 90 tablet 0   linaclotide (LINZESS) 72 MCG capsule Take 1 capsule (72 mcg total) by mouth daily before breakfast. 90 capsule 1   metoprolol tartrate (LOPRESSOR) 25 MG tablet TAKE ONE TABLET BY MOUTH TWICE DAILY 180 tablet 1   modafinil (PROVIGIL) 100 MG tablet Take 1 tablet (100 mg total) by mouth daily. (Patient taking differently: Take 100 mg by mouth daily as needed (sleep disorder).) 30 tablet 5   Pitavastatin Calcium 2 MG TABS Take 1 tablet (2 mg total) by mouth daily. 90 tablet 1   PRESCRIPTION MEDICATION 1 Dose by Other route at bedtime as needed (sleep). CPAP     Propylene Glycol (SYSTANE BALANCE OP) Place 1 drop into both eyes 4 (four) times daily as needed (dry eyes).     SYMBICORT 80-4.5 MCG/ACT inhaler USE 2 PUFFS TWICE A DAY. (Patient taking differently: 2 puffs 2 (two) times daily.) 30.6 g 5   torsemide (DEMADEX) 20 MG tablet Take 1 tablet (20 mg total) by mouth daily. 90 tablet 1   Vilazodone HCl (VIIBRYD) 40 MG TABS Take 1 tablet by mouth daily. 90 tablet 1   warfarin (COUMADIN) 1 MG tablet Take 1 tablet (1 mg total) by mouth daily. TAKE 1 1/2 TABLET EVERY WEDNESDAY OR AS DIRECTED BY ANTICOAGULATION LCINIC 20 tablet 0   warfarin (COUMADIN) 2.5 MG tablet TAKE  1 TABLET BY MOUTH DAILY EXCEPT TAKE 1/2 TABLET ON WEDNESDAYS OR AS DIRECTED BY ANTICOAGULATION CLINIC 180 tablet 1   No facility-administered medications prior to visit.    ROS Review of Systems  Constitutional:  Positive for fatigue. Negative for appetite change, chills, diaphoresis and unexpected weight change.  HENT: Negative.    Eyes: Negative.   Respiratory:  Negative for cough, chest tightness, shortness of breath and wheezing.   Gastrointestinal:  Positive for anal bleeding, blood in stool and constipation. Negative for abdominal pain, diarrhea, nausea and vomiting.  Endocrine: Negative.   Genitourinary: Negative.  Negative for difficulty urinating.  Musculoskeletal: Negative.   Skin:  Positive for pallor.   Neurological:  Positive for dizziness. Negative for weakness, light-headedness and headaches.  Hematological:  Negative for adenopathy. Does not bruise/bleed easily.  Psychiatric/Behavioral:  Positive for confusion, decreased concentration and sleep disturbance. Negative for behavioral problems. The patient is not nervous/anxious.     Objective:  BP 136/86 (BP Location: Right Arm, Patient Position: Sitting, Cuff Size: Normal)   Pulse 85   Temp 98.3 F (36.8 C) (Oral)   Ht 5\' 3"  (1.6 m)   Wt 163 lb (73.9 kg)   SpO2 95%   BMI 28.87 kg/m   BP Readings from Last 3 Encounters:  11/19/22 136/86  10/23/22 (!) 169/81  10/13/22 128/74    Wt Readings from Last 3 Encounters:  11/19/22 163 lb (73.9 kg)  10/13/22 160 lb (72.6 kg)  10/06/22 161 lb (73 kg)    Physical Exam Vitals reviewed. Exam conducted with a chaperone present (Her daughter).  Constitutional:      General: She is not in acute distress.    Appearance: She is ill-appearing. She is not toxic-appearing or diaphoretic.  HENT:     Nose: Nose normal.     Mouth/Throat:     Mouth: Mucous membranes are moist.  Eyes:     General: No scleral icterus.    Conjunctiva/sclera: Conjunctivae normal.  Cardiovascular:     Rate and Rhythm: Normal rate and regular rhythm.     Heart sounds: Murmur heard.     Systolic murmur is present with a grade of 2/6.     No gallop.  Pulmonary:     Effort: Pulmonary effort is normal.     Breath sounds: No stridor. No wheezing, rhonchi or rales.  Abdominal:     General: Abdomen is flat.     Palpations: There is no mass.     Tenderness: There is no abdominal tenderness. There is no guarding.     Hernia: No hernia is present.  Genitourinary:    Rectum: Guaiac result positive. External hemorrhoid and internal hemorrhoid present. No mass, tenderness or anal fissure. Normal anal tone.     Comments: Small right lateral hemorrhoid that is not thrombosed but is oozing blood. Soft stool in the vault  that I disimpacted. Musculoskeletal:     Cervical back: Neck supple.  Lymphadenopathy:     Cervical: No cervical adenopathy.  Skin:    General: Skin is warm.     Coloration: Skin is pale.  Neurological:     Mental Status: She is alert. Mental status is at baseline.     Lab Results  Component Value Date   WBC 5.6 11/19/2022   HGB 11.3 (L) 11/19/2022   HCT 34.5 (L) 11/19/2022   PLT 127.0 (L) 11/19/2022   GLUCOSE 104 (H) 11/19/2022   CHOL 129 03/19/2022   TRIG 40 03/19/2022   HDL 47  03/19/2022   LDLCALC 74 03/19/2022   ALT 12 03/18/2022   AST 16 03/18/2022   NA 138 11/19/2022   K 4.6 11/19/2022   CL 103 11/19/2022   CREATININE 1.78 (H) 11/19/2022   BUN 32 (H) 11/19/2022   CO2 28 11/19/2022   TSH 2.67 10/06/2022   INR 2.3 11/18/2022   HGBA1C 5.3 03/18/2022   MICROALBUR 63.9 (H) 04/20/2018    No results found.  Assessment & Plan:   Chronic idiopathic constipation- Will continue Linzess.  The daughter and her caregiver will help her with her bowel movements.  Iron deficiency anemia due to chronic blood loss- Her H&H have improved. -     IBC + Ferritin; Future -     CBC with Differential/Platelet; Future  Thrombocytopenia- Her platelet count is stable. -     CBC with Differential/Platelet; Future  Stage 3b chronic kidney disease (CKD)- Her kidney function is stable. -     Basic metabolic panel; Future     Follow-up: Return in about 3 months (around 02/18/2023).  Sanda Linger, MD

## 2022-11-19 NOTE — Patient Instructions (Signed)

## 2022-12-07 ENCOUNTER — Other Ambulatory Visit: Payer: Self-pay | Admitting: Internal Medicine

## 2022-12-07 DIAGNOSIS — G301 Alzheimer's disease with late onset: Secondary | ICD-10-CM

## 2022-12-07 DIAGNOSIS — K219 Gastro-esophageal reflux disease without esophagitis: Secondary | ICD-10-CM

## 2022-12-07 DIAGNOSIS — F332 Major depressive disorder, recurrent severe without psychotic features: Secondary | ICD-10-CM

## 2022-12-08 ENCOUNTER — Other Ambulatory Visit (HOSPITAL_BASED_OUTPATIENT_CLINIC_OR_DEPARTMENT_OTHER): Payer: Self-pay

## 2022-12-08 ENCOUNTER — Other Ambulatory Visit: Payer: Self-pay | Admitting: Internal Medicine

## 2022-12-08 DIAGNOSIS — R64 Cachexia: Secondary | ICD-10-CM

## 2022-12-08 MED ORDER — DRONABINOL 5 MG PO CAPS
5.0000 mg | ORAL_CAPSULE | Freq: Two times a day (BID) | ORAL | 1 refills | Status: DC
Start: 2022-12-08 — End: 2023-05-16
  Filled 2022-12-08: qty 180, 90d supply, fill #0

## 2022-12-09 ENCOUNTER — Other Ambulatory Visit (HOSPITAL_BASED_OUTPATIENT_CLINIC_OR_DEPARTMENT_OTHER): Payer: Self-pay

## 2022-12-09 ENCOUNTER — Encounter: Payer: Self-pay | Admitting: Internal Medicine

## 2022-12-09 ENCOUNTER — Ambulatory Visit: Payer: Medicare PPO

## 2022-12-09 DIAGNOSIS — Z7901 Long term (current) use of anticoagulants: Secondary | ICD-10-CM | POA: Diagnosis not present

## 2022-12-09 LAB — POCT INR: INR: 2 (ref 2.0–3.0)

## 2022-12-09 NOTE — Progress Notes (Signed)
Continue 1 tablet daily except take 1/2 tablet on Mondays, Wednesdays and Fridays. Recheck in 4 weeks.

## 2022-12-09 NOTE — Patient Instructions (Addendum)
Pre visit review using our clinic review tool, if applicable. No additional management support is needed unless otherwise documented below in the visit note.  Continue 1 tablet daily except take 1/2 tablet on Mondays, Wednesdays and Fridays. Recheck in 4 weeks.

## 2022-12-10 ENCOUNTER — Other Ambulatory Visit (HOSPITAL_BASED_OUTPATIENT_CLINIC_OR_DEPARTMENT_OTHER): Payer: Self-pay

## 2022-12-18 ENCOUNTER — Other Ambulatory Visit: Payer: Self-pay | Admitting: Internal Medicine

## 2022-12-18 ENCOUNTER — Other Ambulatory Visit (HOSPITAL_BASED_OUTPATIENT_CLINIC_OR_DEPARTMENT_OTHER): Payer: Self-pay

## 2022-12-18 DIAGNOSIS — R64 Cachexia: Secondary | ICD-10-CM

## 2022-12-24 ENCOUNTER — Encounter (HOSPITAL_BASED_OUTPATIENT_CLINIC_OR_DEPARTMENT_OTHER): Payer: Self-pay | Admitting: Emergency Medicine

## 2022-12-24 ENCOUNTER — Emergency Department (HOSPITAL_BASED_OUTPATIENT_CLINIC_OR_DEPARTMENT_OTHER)
Admission: EM | Admit: 2022-12-24 | Discharge: 2022-12-24 | Disposition: A | Discharge status: Home / Self Care | Payer: Medicare PPO | Attending: Emergency Medicine | Admitting: Emergency Medicine

## 2022-12-24 ENCOUNTER — Encounter: Payer: Self-pay | Admitting: Internal Medicine

## 2022-12-24 ENCOUNTER — Emergency Department (HOSPITAL_BASED_OUTPATIENT_CLINIC_OR_DEPARTMENT_OTHER): Payer: Medicare PPO

## 2022-12-24 ENCOUNTER — Ambulatory Visit
Admission: EM | Admit: 2022-12-24 | Discharge: 2022-12-24 | Disposition: A | Discharge status: Home / Self Care | Payer: Medicare PPO

## 2022-12-24 ENCOUNTER — Other Ambulatory Visit: Payer: Self-pay

## 2022-12-24 DIAGNOSIS — X58XXXA Exposure to other specified factors, initial encounter: Secondary | ICD-10-CM | POA: Insufficient documentation

## 2022-12-24 DIAGNOSIS — Z7901 Long term (current) use of anticoagulants: Secondary | ICD-10-CM | POA: Insufficient documentation

## 2022-12-24 DIAGNOSIS — J449 Chronic obstructive pulmonary disease, unspecified: Secondary | ICD-10-CM | POA: Diagnosis not present

## 2022-12-24 DIAGNOSIS — Z79899 Other long term (current) drug therapy: Secondary | ICD-10-CM | POA: Diagnosis not present

## 2022-12-24 DIAGNOSIS — E1122 Type 2 diabetes mellitus with diabetic chronic kidney disease: Secondary | ICD-10-CM | POA: Diagnosis not present

## 2022-12-24 DIAGNOSIS — M79662 Pain in left lower leg: Secondary | ICD-10-CM | POA: Diagnosis present

## 2022-12-24 DIAGNOSIS — S8012XA Contusion of left lower leg, initial encounter: Secondary | ICD-10-CM

## 2022-12-24 DIAGNOSIS — N183 Chronic kidney disease, stage 3 unspecified: Secondary | ICD-10-CM | POA: Diagnosis not present

## 2022-12-24 DIAGNOSIS — I129 Hypertensive chronic kidney disease with stage 1 through stage 4 chronic kidney disease, or unspecified chronic kidney disease: Secondary | ICD-10-CM | POA: Insufficient documentation

## 2022-12-24 DIAGNOSIS — M25572 Pain in left ankle and joints of left foot: Secondary | ICD-10-CM | POA: Diagnosis not present

## 2022-12-24 LAB — PROTIME-INR
INR: 1.7 — ABNORMAL HIGH (ref 0.8–1.2)
Prothrombin Time: 20.1 seconds — ABNORMAL HIGH (ref 11.4–15.2)

## 2022-12-24 MED ORDER — HYDROCODONE-ACETAMINOPHEN 5-325 MG PO TABS
1.0000 | ORAL_TABLET | Freq: Once | ORAL | Status: AC
  Administered 2022-12-24: 1 via ORAL
  Filled 2022-12-24: qty 1

## 2022-12-24 MED ORDER — HYDROCODONE-ACETAMINOPHEN 5-325 MG PO TABS: 1.0000 | ORAL_TABLET | Freq: Four times a day (QID) | ORAL | 0 refills | Status: DC | PRN

## 2022-12-24 NOTE — ED Provider Notes (Signed)
[facility] Provider Note   CSN: [id number] Arrival date & time: 12/24/22  1307     History  Chief Complaint  Patient presents with   Leg Pain    Holly Simmons is a 82 y.o. female with extensive past medical history presents to the ED from urgent care complaining of left lower leg pain and enlarging hematoma that began this morning.  Patient woke up with a swollen area on her medial left leg that has since grown in size.  Patient normally is able to walk with her walker, but is unable to do so due to increasing pain.  Patient's family concern for DVT.  Patient is anticoagulated with Coumadin.  Patient sent here by urgent care for imaging.  Denies known injury to the leg.    Past Medical History:  Diagnosis Date   Allergic rhinitis    Asthma    NOS w/ acute exacerbation   Blood transfusion without reported diagnosis    CKD (chronic kidney disease) stage 3, GFR 30-59 ml/min (HCC) 09/12/2018   Colon polyps    TUBULAR ADENOMAS AND HYPERPLASTIC   Complete heart block (HCC)    requiring PPM (MDT) post surgical myomectomy at [facility],  leads are epicardial with abdominal implant, high ventricular threshold at implant   COPD (chronic obstructive pulmonary disease) (HCC)    Depressive disorder    Diastolic dysfunction    DM (diabetes mellitus) (HCC)    Gallstones    GERD (gastroesophageal reflux disease)    Gout 08/20/2012   Heart murmur    Hyperpotassemia    Hypersomnia    Hypertension    Hypertrophic cardiomyopathy (HCC)    s/p surgical myomectomy at [facility] 11/12 complicated by septal VSD post procedure requiring reoperation with patch closure and tricuspid valve replacement   Kidney stones    Myocardial infarction (HCC) 2011   Obstructive sleep apnea    persistent daytime sleepiness despite cpap   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   Psoriasis    Pyuria    Renal insufficiency    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 Authorizing Provider  HYDROcodone-acetaminophen (NORCO/VICODIN) 5-325 MG tablet Take 1 tablet by mouth every 6 (six) hours as needed. 12/24/22  Yes Bronco Mcgrory R, PA-C  allopurinol (ZYLOPRIM) 100 MG tablet TAKE ONE TABLET BY MOUTH DAILY 12/07/22   Etta Grandchild, MD  amoxicillin (AMOXIL) 500 MG capsule TAKE 4 CAPSULES 1 HOUR PRIOR TO PROCEDURE 01/10/21   Wendall Stade, MD  Betamethasone Valerate 0.12 % foam APPLY TO SCALP AS DIRECTED. Patient taking differently: 1 application  once a week. 12/11/20   Etta Grandchild, MD  brexpiprazole (REXULTI) 0.25 MG TABS tablet TAKE 1 TABLET BY MOUTH DAILY 12/07/22   Etta Grandchild, MD  buPROPion (WELLBUTRIN XL) 300 MG 24 hr tablet TAKE ONE TABLET BY MOUTH ONCE DAILY 11/06/22   Etta Grandchild, MD  dronabinol (MARINOL) 5 MG capsule Take 1 capsule by mouth two times daily before lunch and supper. 12/08/22   Etta Grandchild, MD  famotidine (PEPCID) 40 MG tablet TAKE ONE TABLET BY MOUTH DAILY 12/07/22   Etta Grandchild, MD  ferrous sulfate 325 (65 FE) MG tablet Take 1 tablet (325 mg total) by mouth 2 (two) times daily with a meal. Patient taking  differently: Take 325-650 mg by mouth daily with breakfast. 11/10/20   Etta Grandchild, MD  hydrocortisone (ANUSOL-HC) 25 MG suppository Place 1 suppository (25 mg total) rectally 2 (two) times daily. 10/23/22   Wallis Bamberg, PA-C  ipratropium (ATROVENT) 0.03 % nasal spray USE 2 SPRAYS IN EACH NOSTRIL 3 TIMES A DAY AS NEEDED. Patient taking differently: Place 2 sprays into both nostrils daily as needed (nose bleed). 04/29/18   Coralyn Helling, MD  lamoTRIgine (LAMICTAL) 25 MG tablet TAKE ONE TABLET BY MOUTH ONCE DAILY 11/06/22   Etta Grandchild, MD  linaclotide [facility]) 72 MCG capsule Take 1 capsule (72 mcg total) by mouth daily before breakfast. 10/09/22   Etta Grandchild, MD  metoprolol tartrate (LOPRESSOR) 25  MG tablet TAKE ONE TABLET BY MOUTH TWICE DAILY 10/06/22   Etta Grandchild, MD  modafinil (PROVIGIL) 100 MG tablet Take 1 tablet (100 mg total) by mouth daily. Patient taking differently: Take 100 mg by mouth daily as needed (sleep disorder). 03/29/18   Coralyn Helling, MD  Pitavastatin Calcium 2 MG TABS Take 1 tablet (2 mg total) by mouth daily. 10/06/22   Etta Grandchild, MD  PRESCRIPTION MEDICATION 1 Dose by Other route at bedtime as needed (sleep). CPAP    [provider]  Propylene Glycol (SYSTANE BALANCE OP) Place 1 drop into both eyes 4 (four) times daily as needed (dry eyes).    [provider]  SYMBICORT 80-4.5 MCG/ACT inhaler USE 2 PUFFS TWICE A DAY. Patient taking differently: 2 puffs 2 (two) times daily. 10/11/20   Etta Grandchild, MD  torsemide (DEMADEX) 20 MG tablet Take 1 tablet (20 mg total) by mouth daily. 04/09/22   Etta Grandchild, MD  Vilazodone HCl (VIIBRYD) 40 MG TABS TAKE ONE TABLET BY MOUTH DAILY 12/07/22   Etta Grandchild, MD  warfarin (COUMADIN) 1 MG tablet Take 1 tablet (1 mg total) by mouth daily. TAKE 1 1/2 TABLET EVERY WEDNESDAY OR AS DIRECTED BY ANTICOAGULATION LCINIC 02/26/22   Etta Grandchild, MD  warfarin (COUMADIN) 2.5 MG tablet TAKE 1 TABLET BY MOUTH DAILY EXCEPT TAKE 1/2 TABLET ON WEDNESDAYS OR AS DIRECTED BY ANTICOAGULATION CLINIC 08/08/22   Etta Grandchild, MD      Allergies    Petrolatum-zinc oxide; Sulfa antibiotics; Sulfonamide derivatives; Tetanus toxoid; Tetanus toxoids; Other; Nsaids; and Tetanus toxoid, adsorbed    Review of Systems   Review of Systems  Musculoskeletal:  Positive for gait problem and myalgias (LLE pain).  Skin:  Positive for color change. Negative for wound.  Hematological:  Bruises/bleeds easily.    Physical Exam Updated Vital Signs BP (!) 147/62   Pulse 71   Temp 98 F (36.7 C)   Resp 20   Ht 5\' 3"  (1.6 m)   Wt 72.6 kg   SpO2 95%   BMI 28.34 kg/m  Physical Exam Vitals and nursing note reviewed.  Constitutional:       General: She is not in acute distress.    Appearance: Normal appearance. She is not ill-appearing or diaphoretic.  Cardiovascular:     Rate and Rhythm: Normal rate and regular rhythm.     Pulses:          Dorsalis pedis pulses are 2+ on the right side and 2+ on the left side.  Pulmonary:     Effort: Pulmonary effort is normal.  Musculoskeletal:     Right lower leg: Swelling and tenderness present. No deformity or bony tenderness. No edema.  Comments: There is approximately a 2.5 x 2.5 centimeter hematoma to the left lower extremity.  There is tenderness to palpation in the immediate surrounding area as well as proximally.  There are multiple varicose veins present.  No erythema or increased warmth.  Area is very tender to the touch.  Neurological:     Mental Status: She is alert. Mental status is at baseline.  Psychiatric:        Mood and Affect: Mood normal.        Behavior: Behavior normal.       ED Results / Procedures / Treatments   Labs (all labs ordered are listed, but only abnormal results are displayed) Labs Reviewed  PROTIME-INR - Abnormal; Notable for the following components:      Result Value   Prothrombin Time 20.1 (*)    INR 1.7 (*)    All other components within normal limits    EKG None  Radiology US Venous Img Lower Unilateral Left  Result Date: 12/24/2022 CLINICAL DATA:  Left ankle pain, palpable nodule EXAM: LEFT LOWER EXTREMITY VENOUS DOPPLER ULTRASOUND TECHNIQUE: Gray-scale sonography with compression, as well as color and duplex ultrasound, were performed to evaluate the deep venous system(s) from the level of the common femoral vein through the popliteal and proximal calf veins. COMPARISON:  None Available. FINDINGS: VENOUS Normal compressibility of the common femoral, superficial femoral, and popliteal veins, as well as the visualized calf veins. Visualized portions of profunda femoral vein and great saphenous vein unremarkable. No filling defects  to suggest DVT on grayscale or color Doppler imaging. Doppler waveforms show normal direction of venous flow, normal respiratory plasticity and response to augmentation. Limited views of the contralateral common femoral vein are unremarkable. OTHER Sonographic interrogation of the region of clinical concern demonstrates a 2.1 x 0.6 x 1.5 cm heterogeneous hypoechoic fluid collection. No evidence of internal vascularity. Limitations: none IMPRESSION: 1. No evidence of deep venous thrombosis. 2. Approximately 2.1 x 0.6 x 1.5 cm irregular ovoid hypoechoic collection in the superficial soft tissues of the medial left ankle. No evidence of internal vascularity. Differential considerations include hematoma or less likely abscess. Electronically Signed   By: Malachy Moan M.D.   On: 12/24/2022 14:18    Procedures Procedures    Medications Ordered in ED Medications  HYDROcodone-acetaminophen (NORCO/VICODIN) 5-325 MG per tablet 1 tablet (1 tablet Oral Given 12/24/22 1337)    ED Course/ Medical Decision Making/ A&P                             Medical Decision Making  This patient presents to the ED with chief complaint(s) of LLE pain and hematoma with pertinent past medical history of chronic anticoagulation with warfarin.  The complaint involves an extensive differential diagnosis and also carries with it a high risk of complications and morbidity.    The differential diagnosis includes hematoma, DVT, superficial venous thrombosis, phlebitis   The initial plan is to obtain US to r/o DVT   Additional history obtained: Records reviewed  - patient has seen coag RN 12/09/22 and had normal INR.  Patient's INR goal is 2.0-3.0.  Initial Assessment:   Exam significant for 2.5 x 2.5 cm purple discolored hematoma to medial left lower extremity without erythema or increased warmth.  There is exquisite tenderness to this area and proximally along the calf.  DP pulses are 2+ bilaterally.  Varicose veins  bilaterally.    Patient's family requesting PT-INR  due to patient having abnormal values recently.   Independent ECG/labs interpretation:  The following labs were independently interpreted:  INR 1.7, which is below patient's goal.  PT elevated at 20.1.   Independent visualization and interpretation of imaging: I independently visualized the following imaging with scope of interpretation limited to determining acute life threatening conditions related to emergency care: DVT US LLE, which revealed no evidence of DVT.  Mass consistent with hematoma on ultrasound.  I agree with radiologist interpretation.   Treatment and Reassessment: Patient given PO pain medicine with improvement in her symptoms.  Patient was able to stand and sit when transferring from wheelchair to bed.  Patient has Rollator walker at home for use as well as wheelchair, should she need this.    Disposition:   Discussed supportive care measures for hematoma with patient and daughter at bedside.  Recommended follow-up with coagulation RN for abnormal INR value.  Will send patient home with short course of pain medicine as she is unable to take NSAIDs.  Recommended Tylenol for mild to moderate pain.  Recommended PCP follow-up if symptoms do not begin to improve.  The patient has been appropriately medically screened and/or stabilized in the ED. I have low suspicion for any other emergent medical condition which would require further screening, evaluation or treatment in the ED or require inpatient management. At time of discharge the patient is hemodynamically stable and in no acute distress. I have discussed work-up results and diagnosis with patient and answered all questions. Patient is agreeable with discharge plan. We discussed strict return precautions for returning to the emergency department and they verbalized understanding.            Final Clinical Impression(s) / ED Diagnoses Final diagnoses:  Hematoma of left  lower extremity, initial encounter    Rx / DC Orders ED Discharge Orders          Ordered    HYDROcodone-acetaminophen (NORCO/VICODIN) 5-325 MG tablet  Every 6 hours PRN        12/24/22 1446              Lenard Simmer, PA-C 12/24/22 1532    Tegeler, Canary Brim, MD 12/25/22 0700

## 2022-12-24 NOTE — ED Triage Notes (Signed)
Patient presents to ED via POV from home. Here with left lower leg pain that is affecting her gait. Family expresses concern for DVT. Patient is on warfarin.

## 2022-12-24 NOTE — Discharge Instructions (Addendum)
Thank you for allowing me to be a part of your care today.   Your ultrasound did not show evidence of a blood clot in your leg.  You have a hematoma (collection of blood) under your skin.  These will typically resolve on their own with supportive care.    If you notice this area is continuing to swell, use ice, otherwise I recommend using a warm compress several times during the day to help with healing.  Do not use for more than 20 minutes at a time and always use a barrier (such as a towel) between heat and your skin to prevent burns.    For mild to moderate pain I recommend taking 500 to 1000 mg of Tylenol every 6 hours.  I have sent over a narcotic pain medicine to the pharmacy to help with more severe pain.  Do not exceed 4000 mg of Tylenol within a 24-hour period from all sources.  You will be notified of your PT-INR results via MyChart.  Follow-up with Sherrie George, RN if this result is abnormal and your warfarin requires adjustment.    Return to the ED if you experience sudden worsening of your symptoms or if you have any new concerns.

## 2022-12-24 NOTE — ED Provider Notes (Signed)
[facility]    CSN: [id number] Arrival date & time: 12/24/22  1140      History   Chief Complaint No chief complaint on file.   HPI Holly Simmons is a 82 y.o. female presents with family for evaluation of a large bruise on her left leg.  Patient does have a history of A-fib on Coumadin.  Last INR from May 7 was 2.0.  Patient reports that she noticed today a raised hard bruise on her left lower medial leg.  Denies any known injury.  States it is very painful to touch and she cannot put weight on the leg due to the pain.  She reports she does bruise often on the Coumadin but has never had something like this before.  Denies any new medication such as antibiotics that might change her INR.  Denies bleeding from gums or hematuria.  She has been brought in by her family for further evaluation.  No other concerns at this time.  HPI  Past Medical History:  Diagnosis Date   Allergic rhinitis    Asthma    NOS w/ acute exacerbation   Blood transfusion without reported diagnosis    CKD (chronic kidney disease) stage 3, GFR 30-59 ml/min (HCC) 09/12/2018   Colon polyps    TUBULAR ADENOMAS AND HYPERPLASTIC   Complete heart block (HCC)    requiring PPM (MDT) post surgical myomectomy at [facility],  leads are epicardial with abdominal implant, high ventricular threshold at implant   COPD (chronic obstructive pulmonary disease) (HCC)    Depressive disorder    Diastolic dysfunction    DM (diabetes mellitus) (HCC)    Gallstones    GERD (gastroesophageal reflux disease)    Gout 08/20/2012   Heart murmur    Hyperpotassemia    Hypersomnia    Hypertension    Hypertrophic cardiomyopathy (HCC)    s/p surgical myomectomy at [facility] 11/12 complicated by septal VSD post procedure requiring reoperation with patch closure and tricuspid valve replacement   Kidney stones    Myocardial infarction (HCC) 2011   Obstructive sleep apnea    persistent daytime sleepiness despite cpap   Pacemaker  07/13/2012   Psoriasis    Pyuria    Renal insufficiency    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   Typical atrial flutter (HCC) 9/15    Patient Active Problem List   Diagnosis Date Noted   Dementia (HCC) 09/02/2022   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   Severe episode of recurrent major depressive disorder, without psychotic features (HCC) 02/26/2022   PAF (paroxysmal atrial fibrillation) (HCC) 08/31/2021   Thrombocytopenia (HCC) 12/04/2020   Encounter for general adult medical examination with abnormal findings 07/12/2020   Cachexia (HCC) 08/16/2019   Iron deficiency anemia due to chronic blood loss 09/23/2018   Stage 3b chronic kidney disease (CKD) (HCC) 09/12/2018   Microalbuminuria due to type 2 diabetes mellitus (HCC) 04/21/2018   Obesity (BMI 30.0-34.9) 02/05/2016   Osteoporosis with pathological fracture of ankle and foot 05/30/2015   Encounter for therapeutic drug monitoring 11/22/2014   Gout 08/20/2012   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 06/10/2012   Other screening mammogram 02/04/2012   Hyperlipidemia LDL goal <130 12/04/2011   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   Long term (current) use of anticoagulants 07/18/2011   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   Hypersomnia 08/16/2009   Chronic diastolic heart failure (HCC) 08/25/2008   GERD 08/10/2008  Obstructive sleep apnea 08/10/2007   Allergic rhinitis 08/09/2007    Past Surgical History:  Procedure Laterality Date   ABDOMINAL HYSTERECTOMY     APPENDECTOMY     BREAST CYST EXCISION     CARDIOVERSION N/A 08/01/2014   Procedure: CARDIOVERSION;  Surgeon: Vesta Mixer, MD;  Location: [facility] ENDOSCOPY;  Service: Cardiovascular;  Laterality: N/A;   CESAREAN SECTION     CHOLECYSTECTOMY     COLONOSCOPY     EYE SURGERY  01/16/2016   left eye lens implant   EYE SURGERY  01/09/2016   right eye lens implant   INSERT  / REPLACE / REMOVE PACEMAKER  07/13/2012   PACEMAKER INSERTION  07/02/2011   epicardial wires with abdominal implant at [facility] 12/12,  high ventricular lead threshold at implant per Dr Christin Fudge   PERMANENT PACEMAKER INSERTION N/A 07/13/2012   Procedure: PERMANENT PACEMAKER INSERTION;  Surgeon: Hillis Range, MD;  Location: [facility] CATH LAB;  Service: Cardiovascular;  Laterality: N/A;   POLYPECTOMY     septal myomectomy for hypertrophic CM  06/23/2011   by Dr Silvestre Mesi at [facility], complicated by septal VSD requiring patch repair and tricuspid valve replacement   TONSILLECTOMY     TOOTH EXTRACTION     TRICUSPID VALVE REPLACEMENT  06/2011   Medtronic 27mm Mosaic tissue valve   VSD REPAIR  06/2011    OB History   No obstetric history on file.      Home Medications    Prior to Admission medications   Medication Sig Start Date End Date Taking? Authorizing Provider  allopurinol (ZYLOPRIM) 100 MG tablet TAKE ONE TABLET BY MOUTH DAILY 12/07/22   Etta Grandchild, MD  amoxicillin (AMOXIL) 500 MG capsule TAKE 4 CAPSULES 1 HOUR PRIOR TO PROCEDURE 01/10/21   Wendall Stade, MD  Betamethasone Valerate 0.12 % foam APPLY TO SCALP AS DIRECTED. Patient taking differently: 1 application  once a week. 12/11/20   Etta Grandchild, MD  brexpiprazole (REXULTI) 0.25 MG TABS tablet TAKE 1 TABLET BY MOUTH DAILY 12/07/22   Etta Grandchild, MD  buPROPion (WELLBUTRIN XL) 300 MG 24 hr tablet TAKE ONE TABLET BY MOUTH ONCE DAILY 11/06/22   Etta Grandchild, MD  dronabinol (MARINOL) 5 MG capsule Take 1 capsule by mouth two times daily before lunch and supper. 12/08/22   Etta Grandchild, MD  famotidine (PEPCID) 40 MG tablet TAKE ONE TABLET BY MOUTH DAILY 12/07/22   Etta Grandchild, MD  ferrous sulfate 325 (65 FE) MG tablet Take 1 tablet (325 mg total) by mouth 2 (two) times daily with a meal. Patient taking differently: Take 325-650 mg by mouth daily with breakfast. 11/10/20   Etta Grandchild, MD  HYDROcodone bit-homatropine (HYCODAN) 5-1.5  MG/5ML syrup Take 5 mLs by mouth every 8 (eight) hours as needed for cough. 10/06/22   [provider]  hydrocortisone (ANUSOL-HC) 25 MG suppository Place 1 suppository (25 mg total) rectally 2 (two) times daily. 10/23/22   Wallis Bamberg, PA-C  ipratropium (ATROVENT) 0.03 % nasal spray USE 2 SPRAYS IN EACH NOSTRIL 3 TIMES A DAY AS NEEDED. Patient taking differently: Place 2 sprays into both nostrils daily as needed (nose bleed). 04/29/18   Coralyn Helling, MD  lamoTRIgine (LAMICTAL) 25 MG tablet TAKE ONE TABLET BY MOUTH ONCE DAILY 11/06/22   Etta Grandchild, MD  linaclotide [facility]) 72 MCG capsule Take 1 capsule (72 mcg total) by mouth daily before breakfast. 10/09/22   Etta Grandchild, MD  metoprolol tartrate (LOPRESSOR)  25 MG tablet TAKE ONE TABLET BY MOUTH TWICE DAILY 10/06/22   Etta Grandchild, MD  modafinil (PROVIGIL) 100 MG tablet Take 1 tablet (100 mg total) by mouth daily. Patient taking differently: Take 100 mg by mouth daily as needed (sleep disorder). 03/29/18   Coralyn Helling, MD  Pitavastatin Calcium 2 MG TABS Take 1 tablet (2 mg total) by mouth daily. 10/06/22   Etta Grandchild, MD  PRESCRIPTION MEDICATION 1 Dose by Other route at bedtime as needed (sleep). CPAP    [provider]  Propylene Glycol (SYSTANE BALANCE OP) Place 1 drop into both eyes 4 (four) times daily as needed (dry eyes).    [provider]  SYMBICORT 80-4.5 MCG/ACT inhaler USE 2 PUFFS TWICE A DAY. Patient taking differently: 2 puffs 2 (two) times daily. 10/11/20   Etta Grandchild, MD  torsemide (DEMADEX) 20 MG tablet Take 1 tablet (20 mg total) by mouth daily. 04/09/22   Etta Grandchild, MD  Vilazodone HCl (VIIBRYD) 40 MG TABS TAKE ONE TABLET BY MOUTH DAILY 12/07/22   Etta Grandchild, MD  warfarin (COUMADIN) 1 MG tablet Take 1 tablet (1 mg total) by mouth daily. TAKE 1 1/2 TABLET EVERY WEDNESDAY OR AS DIRECTED BY ANTICOAGULATION LCINIC 02/26/22   Etta Grandchild, MD  warfarin (COUMADIN) 2.5 MG tablet TAKE 1 TABLET  BY MOUTH DAILY EXCEPT TAKE 1/2 TABLET ON WEDNESDAYS OR AS DIRECTED BY ANTICOAGULATION CLINIC 08/08/22   Etta Grandchild, MD    Family History Family History  Problem Relation Age of Onset   Hypertension Daughter    Heart disease Maternal Grandfather    Heart disease Paternal Grandfather    Bladder Cancer Father    Prostate cancer Father    Cancer Father    Varicose Veins Father    Heart attack Father    Breast cancer Mother    Dementia Mother    Hypertension Mother    Cancer Mother    Ovarian cancer Maternal Aunt    Pancreatic cancer Cousin    Breast cancer Paternal Aunt    Dementia Maternal Aunt        x 2   Dementia Maternal Uncle        x 2   Colon cancer Neg Hx    Rectal cancer Neg Hx    Stomach cancer Neg Hx    Esophageal cancer Neg Hx     Social History Social History   Tobacco Use   Smoking status: Never   Smokeless tobacco: Never  Vaping Use   Vaping Use: Never used  Substance Use Topics   Alcohol use: No    Alcohol/week: 0.0 standard drinks of alcohol   Drug use: No     Allergies   Petrolatum-zinc oxide; Sulfa antibiotics; Sulfonamide derivatives; Tetanus toxoid; Tetanus toxoids; Other; Nsaids; and Tetanus toxoid, adsorbed   Review of Systems Review of Systems  Skin:        Bruise on leg     Physical Exam Triage Vital Signs ED Triage Vitals  Enc Vitals Group     BP 12/24/22 1154 136/72     Pulse Rate 12/24/22 1154 66     Resp 12/24/22 1154 18     Temp 12/24/22 1154 98.6 F (37 C)     Temp Source 12/24/22 1154 Oral     SpO2 12/24/22 1154 91 %     Weight --      Height --      Head Circumference --  Peak Flow --      Pain Score 12/24/22 1158 7     Pain Loc --      Pain Edu? --      Excl. in GC? --    No data found.  Updated Vital Signs BP 136/72 (BP Location: Left Arm)   Pulse 66   Temp 98.6 F (37 C) (Oral)   Resp 18   SpO2 91%   Visual Acuity Right Eye Distance:   Left Eye Distance:   Bilateral Distance:    Right  Eye Near:   Left Eye Near:    Bilateral Near:     Physical Exam Vitals and nursing note reviewed.  Constitutional:      Appearance: Normal appearance.  HENT:     Head: Normocephalic and atraumatic.  Eyes:     Pupils: Pupils are equal, round, and reactive to light.  Cardiovascular:     Rate and Rhythm: Normal rate.  Pulmonary:     Effort: Pulmonary effort is normal.  Skin:    General: Skin is warm and dry.          Comments: 2 x 2 centimeter exquisitely tender raised hematoma to the left lower medial leg.  No erythema or warmth.  Significant varicosities to leg.  Neurological:     General: No focal deficit present.     Mental Status: She is alert and oriented to person, place, and time.  Psychiatric:        Mood and Affect: Mood normal.        Behavior: Behavior normal.      UC Treatments / Results  Labs (all labs ordered are listed, but only abnormal results are displayed) Labs Reviewed - No data to display  POCT INR Order: [id number] Status: Final result     Visible to patient: Yes (seen)     Next appt: 01/06/2023 at 07:00 AM in Cardiology ([facility] Device Remotes)   0 Result Notes          Component Ref Range & Units 2 wk ago (12/09/22) 1 mo ago (11/18/22) 1 mo ago (11/04/22) 2 mo ago (09/30/22) 3 mo ago (09/12/22) 3 mo ago (09/02/22) 4 mo ago (08/08/22)  INR 2.0 - 3.0 2.0 2.3 4.5 Abnormal  2.6 2.2 High  R, CM 2.6 3.4 Abnormal   Resulting Agency     [facility]                EKG   Radiology No results found.  Procedures Procedures (including critical care time)  Medications Ordered in UC Medications - No data to display  Initial Impression / Assessment and Plan / UC Course  I have reviewed the triage vital signs and the nursing notes.  Pertinent labs & imaging results that were available during my care of the patient were reviewed by me and considered in my medical decision making (see chart for details).     Discussed with  patient and her family concerns.  Discussed likely hematoma but patient reports pain is so intense she cannot bear weight on the leg.  Unclear if any underlying concern is causing this pain.  Given limitations I have an urgent care discussed ER for possible imaging.  Patient and family are in agreement and will go POV to the ER. Final Clinical Impressions(s) / UC Diagnoses   Final diagnoses:  Hematoma of left lower extremity, initial encounter  Current use of long term anticoagulation     Discharge Instructions  Please go to the ER for further evaluation of your symptoms   ED Prescriptions   None    PDMP not reviewed this encounter.   Radford Pax, NP 12/24/22 [phone]

## 2022-12-24 NOTE — ED Triage Notes (Addendum)
Pt presents to UC w/ c/o left lower leg bruise which she noticed this morning. Pt is in a wheelchair because it is painful to place weight on leg. Denies injury. Pt on warfarin.

## 2022-12-24 NOTE — Discharge Instructions (Signed)
Please go to the ER for further evaluation of your symptoms  

## 2022-12-25 ENCOUNTER — Ambulatory Visit (INDEPENDENT_AMBULATORY_CARE_PROVIDER_SITE_OTHER): Payer: Medicare PPO

## 2022-12-25 DIAGNOSIS — Z7901 Long term (current) use of anticoagulants: Secondary | ICD-10-CM | POA: Diagnosis not present

## 2022-12-25 NOTE — Progress Notes (Addendum)
Pt was in ER yesterday for hematoma on lower leg. INR was checked and was 1.7.  Pt's son sent a mychart msg concerning the subtherapeutic INR.  Increase dose today to take 1 1/2 tablets and the continue 1 tablet daily except take 1/2 tablet on Mondays, Wednesdays and Fridays. Recheck in 2 weeks.  Contacted pt's son, Perlie Gold, and advised of dosing and to keep apt for INR recheck that is already scheduled for 6/7. Perlie Gold verbalized understanding.

## 2022-12-25 NOTE — Telephone Encounter (Signed)
Contacted pt's son, Perlie Gold, and advised of dosing and to keep apt for INR recheck that is already scheduled for 6/7. Perlie Gold verbalized understanding.  Dosing instructions documented in anticoagulation encounter for today.

## 2022-12-25 NOTE — Telephone Encounter (Signed)
I will contact pt's son, Perlie Gold, later today with dosing instructions.

## 2022-12-25 NOTE — Patient Instructions (Addendum)
Pre visit review using our clinic review tool, if applicable. No additional management support is needed unless otherwise documented below in the visit note.  Increase dose today to take 1 1/2 tablets and the continue 1 tablet daily except take 1/2 tablet on Mondays, Wednesdays and Fridays. Recheck in 2 weeks.

## 2022-12-31 ENCOUNTER — Encounter: Payer: Self-pay | Admitting: Internal Medicine

## 2023-01-01 ENCOUNTER — Other Ambulatory Visit (HOSPITAL_BASED_OUTPATIENT_CLINIC_OR_DEPARTMENT_OTHER): Payer: Self-pay

## 2023-01-06 ENCOUNTER — Other Ambulatory Visit: Payer: Self-pay | Admitting: Internal Medicine

## 2023-01-06 ENCOUNTER — Ambulatory Visit (INDEPENDENT_AMBULATORY_CARE_PROVIDER_SITE_OTHER): Payer: Medicare PPO

## 2023-01-06 DIAGNOSIS — I442 Atrioventricular block, complete: Secondary | ICD-10-CM

## 2023-01-06 DIAGNOSIS — L409 Psoriasis, unspecified: Secondary | ICD-10-CM

## 2023-01-06 LAB — CUP PACEART REMOTE DEVICE CHECK
Battery Impedance: 2299 Ohm
Battery Remaining Longevity: 27 mo
Battery Voltage: 2.74 V
Brady Statistic AP VP Percent: 100 %
Brady Statistic AP VS Percent: 0 %
Brady Statistic AS VP Percent: 0 %
Brady Statistic AS VS Percent: 0 %
Date Time Interrogation Session: 20240604092852
Implantable Lead Connection Status: 753985
Implantable Lead Connection Status: 753985
Implantable Lead Implant Date: 20131210
Implantable Lead Implant Date: 20131210
Implantable Lead Location: 753858
Implantable Lead Location: 753859
Implantable Lead Model: 4296
Implantable Lead Model: 5076
Implantable Pulse Generator Implant Date: 20131210
Lead Channel Impedance Value: 450 Ohm
Lead Channel Impedance Value: 989 Ohm
Lead Channel Pacing Threshold Amplitude: 0.625 V
Lead Channel Pacing Threshold Amplitude: 0.875 V
Lead Channel Pacing Threshold Pulse Width: 0.4 ms
Lead Channel Pacing Threshold Pulse Width: 0.4 ms
Lead Channel Setting Pacing Amplitude: 2 V
Lead Channel Setting Pacing Amplitude: 2.5 V
Lead Channel Setting Pacing Pulse Width: 0.46 ms
Lead Channel Setting Sensing Sensitivity: 8 mV
Zone Setting Status: 755011
Zone Setting Status: 755011

## 2023-01-09 ENCOUNTER — Ambulatory Visit: Payer: Medicare PPO

## 2023-01-09 DIAGNOSIS — Z7901 Long term (current) use of anticoagulants: Secondary | ICD-10-CM | POA: Diagnosis not present

## 2023-01-09 LAB — POCT INR: INR: 1.8 — AB (ref 2.0–3.0)

## 2023-01-09 NOTE — Progress Notes (Addendum)
Pt's son, Perlie Gold, reports pt's appetite has gotten a lot better.  Increase dose today to take 1 tablet and then change weekly dose to take 1 tablet daily except take 1/2 tablet on Mondays and Friday. Recheck in 3 weeks.

## 2023-01-09 NOTE — Patient Instructions (Addendum)
Pre visit review using our clinic review tool, if applicable. No additional management support is needed unless otherwise documented below in the visit note.  Increase dose today to take 1 tablet and then change weekly dose to take 1 tablet daily except take 1/2 tablet on Mondays and Friday. Recheck in 3 weeks.

## 2023-01-30 ENCOUNTER — Telehealth: Payer: Self-pay

## 2023-01-30 ENCOUNTER — Other Ambulatory Visit: Payer: Self-pay | Admitting: Internal Medicine

## 2023-01-30 ENCOUNTER — Ambulatory Visit: Payer: Medicare PPO

## 2023-01-30 DIAGNOSIS — F02C11 Dementia in other diseases classified elsewhere, severe, with agitation: Secondary | ICD-10-CM

## 2023-01-30 DIAGNOSIS — F3341 Major depressive disorder, recurrent, in partial remission: Secondary | ICD-10-CM

## 2023-01-30 DIAGNOSIS — Z7901 Long term (current) use of anticoagulants: Secondary | ICD-10-CM | POA: Diagnosis not present

## 2023-01-30 DIAGNOSIS — F332 Major depressive disorder, recurrent severe without psychotic features: Secondary | ICD-10-CM

## 2023-01-30 DIAGNOSIS — F418 Other specified anxiety disorders: Secondary | ICD-10-CM

## 2023-01-30 LAB — POCT INR: INR: 1.9 — AB (ref 2.0–3.0)

## 2023-01-30 MED ORDER — LAMOTRIGINE 25 MG PO TABS: 25.0000 mg | ORAL_TABLET | Freq: Every day | ORAL | 0 refills | Status: DC

## 2023-01-30 MED ORDER — BUPROPION HCL ER (XL) 300 MG PO TB24
300.0000 mg | ORAL_TABLET | Freq: Every day | ORAL | 0 refills | Status: AC
Start: 2023-01-30 — End: ?

## 2023-01-30 NOTE — Telephone Encounter (Signed)
Pt in today with daughter. She requested refill of bupropion and lamictal. Bupropion sent in. Request sent to [facility]. Forwarding request for lamictal refill to PCP. Last refill 11/06/22, #90, no refills.

## 2023-01-30 NOTE — Patient Instructions (Addendum)
Pre visit review using our clinic review tool, if applicable. No additional management support is needed unless otherwise documented below in the visit note.  Increase dose today to take 1 1/2 tablets and then change weekly dose to take 1 tablet daily except take 1/2 tablet on Mondays. Recheck in 3 weeks. 

## 2023-01-30 NOTE — Progress Notes (Addendum)
Increase dose today to take 1 1/2 tablets and then change weekly dose to take 1 tablet daily except take 1/2 tablet on Mondays. Recheck in 3 weeks.   Pt's daughter, Enrique Sack, requested refill of lamictal and bupropion. Pt is consistent with PCP apts. Sending in bupropion and forwarding a msg to PCP requesting lamictal refill.

## 2023-01-31 ENCOUNTER — Other Ambulatory Visit: Payer: Self-pay | Admitting: Internal Medicine

## 2023-01-31 DIAGNOSIS — L409 Psoriasis, unspecified: Secondary | ICD-10-CM

## 2023-02-02 NOTE — Progress Notes (Signed)
Remote pacemaker transmission.   

## 2023-02-08 ENCOUNTER — Encounter: Payer: Self-pay | Admitting: Internal Medicine

## 2023-02-10 ENCOUNTER — Ambulatory Visit: Payer: Medicare PPO | Admitting: Internal Medicine

## 2023-02-10 ENCOUNTER — Encounter: Payer: Self-pay | Admitting: Internal Medicine

## 2023-02-10 VITALS — BP 126/64 | HR 94 | Temp 97.9°F | Resp 16 | Ht 63.0 in | Wt 170.0 lb

## 2023-02-10 DIAGNOSIS — L02419 Cutaneous abscess of limb, unspecified: Secondary | ICD-10-CM | POA: Insufficient documentation

## 2023-02-10 DIAGNOSIS — L03119 Cellulitis of unspecified part of limb: Secondary | ICD-10-CM

## 2023-02-10 DIAGNOSIS — L989 Disorder of the skin and subcutaneous tissue, unspecified: Secondary | ICD-10-CM | POA: Insufficient documentation

## 2023-02-10 MED ORDER — DOXYCYCLINE MONOHYDRATE 50 MG PO CAPS
50.00 mg | ORAL_CAPSULE | Freq: Two times a day (BID) | ORAL | 0 refills | Status: AC
Start: 2023-02-10 — End: 2023-02-17

## 2023-02-10 NOTE — Patient Instructions (Signed)

## 2023-02-10 NOTE — Progress Notes (Signed)
Subjective:  Patient ID: Holly Simmons, female    DOB: Mar 16, 1941  Age: 82 y.o. MRN: [id number]  CC: Rash   HPI Holly Simmons presents for f/up -----  Discussed the use of AI scribe software for clinical note transcription with the patient, who gave verbal consent to proceed.  History of Present Illness   The patient presents with a chief complaint of a persistent, painful, and itchy lesion on the arm. Initially thought to be a scab from a minor injury, the lesion has not resolved over the course of a week. The patient is currently on Coumadin, which was considered as a potential reason for the delayed healing. However, a family member who is a medical doctor suggested that the lesion was not a scab and recommended further evaluation. The patient has not previously seen a dermatologist for this issue. The exact nature of the lesion remains unclear, but it was suggested that it could be a serocultronoma.       Outpatient Medications Prior to Visit  Medication Sig Dispense Refill   allopurinol (ZYLOPRIM) 100 MG tablet TAKE ONE TABLET BY MOUTH DAILY 90 tablet 1   amoxicillin (AMOXIL) 500 MG capsule TAKE 4 CAPSULES 1 HOUR PRIOR TO PROCEDURE 30 capsule 0   Betamethasone Valerate 0.12 % foam Apply 1 application topically daily. 100 g 0   brexpiprazole (REXULTI) 0.25 MG TABS tablet Take 1 tablet (0.25 mg total) by mouth daily. Follow-up appt is due in July 30 tablet 0   buPROPion (WELLBUTRIN XL) 300 MG 24 hr tablet Take 1 tablet (300 mg total) by mouth daily. 90 tablet 0   dronabinol (MARINOL) 5 MG capsule Take 1 capsule by mouth two times daily before lunch and supper. 180 capsule 1   famotidine (PEPCID) 40 MG tablet TAKE ONE TABLET BY MOUTH DAILY 90 tablet 1   ferrous sulfate 325 (65 FE) MG tablet Take 1 tablet (325 mg total) by mouth 2 (two) times daily with a meal. (Patient taking differently: Take 325-650 mg by mouth daily with breakfast.) 180 tablet 1    HYDROcodone-acetaminophen (NORCO/VICODIN) 5-325 MG tablet Take 1 tablet by mouth every 6 (six) hours as needed. 14 tablet 0   hydrocortisone (ANUSOL-HC) 25 MG suppository Place 1 suppository (25 mg total) rectally 2 (two) times daily. 14 suppository 0   ipratropium (ATROVENT) 0.03 % nasal spray USE 2 SPRAYS IN EACH NOSTRIL 3 TIMES A DAY AS NEEDED. (Patient taking differently: Place 2 sprays into both nostrils daily as needed (nose bleed).) 30 mL 0   lamoTRIgine (LAMICTAL) 25 MG tablet Take 1 tablet (25 mg total) by mouth daily. 90 tablet 0   linaclotide (LINZESS) 72 MCG capsule Take 1 capsule (72 mcg total) by mouth daily before breakfast. 90 capsule 1   metoprolol tartrate (LOPRESSOR) 25 MG tablet TAKE ONE TABLET BY MOUTH TWICE DAILY 180 tablet 1   modafinil (PROVIGIL) 100 MG tablet Take 1 tablet (100 mg total) by mouth daily. (Patient taking differently: Take 100 mg by mouth daily as needed (sleep disorder).) 30 tablet 5   Pitavastatin Calcium 2 MG TABS Take 1 tablet (2 mg total) by mouth daily. 90 tablet 1   PRESCRIPTION MEDICATION 1 Dose by Other route at bedtime as needed (sleep). CPAP     Propylene Glycol (SYSTANE BALANCE OP) Place 1 drop into both eyes 4 (four) times daily as needed (dry eyes).     SYMBICORT 80-4.5 MCG/ACT inhaler USE 2 PUFFS TWICE A DAY. (Patient taking differently: 2  puffs 2 (two) times daily.) 30.6 g 5   torsemide (DEMADEX) 20 MG tablet Take 1 tablet (20 mg total) by mouth daily. 90 tablet 1   Vilazodone HCl (VIIBRYD) 40 MG TABS TAKE ONE TABLET BY MOUTH DAILY 90 tablet 1   warfarin (COUMADIN) 1 MG tablet Take 1 tablet (1 mg total) by mouth daily. TAKE 1 1/2 TABLET EVERY WEDNESDAY OR AS DIRECTED BY ANTICOAGULATION LCINIC 20 tablet 0   warfarin (COUMADIN) 2.5 MG tablet TAKE 1 TABLET BY MOUTH DAILY EXCEPT TAKE 1/2 TABLET ON WEDNESDAYS OR AS DIRECTED BY ANTICOAGULATION CLINIC 180 tablet 1   No facility-administered medications prior to visit.    ROS Review of Systems   Constitutional:  Positive for fatigue. Negative for fever.  HENT: Negative.    Respiratory:  Positive for shortness of breath. Negative for chest tightness and wheezing.   Cardiovascular:  Negative for chest pain, palpitations and leg swelling.  Gastrointestinal: Negative.  Negative for abdominal pain, constipation, diarrhea, nausea and vomiting.  Endocrine: Negative.   Genitourinary:  Negative for difficulty urinating.  Musculoskeletal:  Negative for arthralgias and neck stiffness.  Skin: Negative.        Left lateral elbow there is a raised lesion that looks suspicious for SCC. There is a purulent exudate and erythema at the base but not streaking, fluctuance, or induration.  Neurological: Negative.  Negative for dizziness.  Hematological:  Negative for adenopathy. Does not bruise/bleed easily.  Psychiatric/Behavioral:  Positive for confusion and decreased concentration. Negative for behavioral problems and dysphoric mood. The patient is not nervous/anxious and is not hyperactive.     Objective:  BP 126/64 (BP Location: Right Arm, Patient Position: Sitting, Cuff Size: Large)   Pulse 94   Temp 97.9 F (36.6 C) (Oral)   Ht 5\' 3"  (1.6 m)   Wt 170 lb (77.1 kg)   SpO2 98%   BMI 30.11 kg/m   BP Readings from Last 3 Encounters:  02/10/23 126/64  12/24/22 (!) 147/62  12/24/22 136/72    Wt Readings from Last 3 Encounters:  02/10/23 170 lb (77.1 kg)  12/24/22 160 lb (72.6 kg)  11/19/22 163 lb (73.9 kg)    Physical Exam Vitals reviewed.  Constitutional:      Appearance: She is not ill-appearing.  HENT:     Nose: Nose normal.     Mouth/Throat:     Mouth: Mucous membranes are moist.  Eyes:     General: Scleral icterus present.     Conjunctiva/sclera: Conjunctivae normal.  Cardiovascular:     Rate and Rhythm: Normal rate and regular rhythm.     Heart sounds: Murmur heard.     Systolic murmur is present with a grade of 2/6.  Pulmonary:     Effort: Pulmonary effort is  normal.     Breath sounds: Examination of the right-lower field reveals rales. Examination of the left-lower field reveals rales. Rales present. No decreased breath sounds, wheezing or rhonchi.  Abdominal:     General: Abdomen is flat.     Palpations: There is no mass.     Tenderness: There is no abdominal tenderness. There is no guarding.     Hernia: No hernia is present.  Musculoskeletal:     Cervical back: Neck supple.     Right lower leg: No edema.     Left lower leg: No edema.  Lymphadenopathy:     Cervical: No cervical adenopathy.  Skin:    General: Skin is warm.     Coloration:  Skin is pale.     Findings: Lesion present. No rash.  Neurological:     Mental Status: She is alert. Mental status is at baseline.  Psychiatric:        Mood and Affect: Mood normal.     Lab Results  Component Value Date   WBC 5.6 11/19/2022   HGB 11.3 (L) 11/19/2022   HCT 34.5 (L) 11/19/2022   PLT 127.0 (L) 11/19/2022   GLUCOSE 104 (H) 11/19/2022   CHOL 129 03/19/2022   TRIG 40 03/19/2022   HDL 47 03/19/2022   LDLCALC 74 03/19/2022   ALT 12 03/18/2022   AST 16 03/18/2022   NA 138 11/19/2022   K 4.6 11/19/2022   CL 103 11/19/2022   CREATININE 1.78 (H) 11/19/2022   BUN 32 (H) 11/19/2022   CO2 28 11/19/2022   TSH 2.67 10/06/2022   INR 1.9 (A) 01/30/2023   HGBA1C 5.3 03/18/2022   MICROALBUR 63.9 (H) 04/20/2018    US Venous Img Lower Unilateral Left  Result Date: 12/24/2022 CLINICAL DATA:  Left ankle pain, palpable nodule EXAM: LEFT LOWER EXTREMITY VENOUS DOPPLER ULTRASOUND TECHNIQUE: Gray-scale sonography with compression, as well as color and duplex ultrasound, were performed to evaluate the deep venous system(s) from the level of the common femoral vein through the popliteal and proximal calf veins. COMPARISON:  None Available. FINDINGS: VENOUS Normal compressibility of the common femoral, superficial femoral, and popliteal veins, as well as the visualized calf veins. Visualized portions  of profunda femoral vein and great saphenous vein unremarkable. No filling defects to suggest DVT on grayscale or color Doppler imaging. Doppler waveforms show normal direction of venous flow, normal respiratory plasticity and response to augmentation. Limited views of the contralateral common femoral vein are unremarkable. OTHER Sonographic interrogation of the region of clinical concern demonstrates a 2.1 x 0.6 x 1.5 cm heterogeneous hypoechoic fluid collection. No evidence of internal vascularity. Limitations: none IMPRESSION: 1. No evidence of deep venous thrombosis. 2. Approximately 2.1 x 0.6 x 1.5 cm irregular ovoid hypoechoic collection in the superficial soft tissues of the medial left ankle. No evidence of internal vascularity. Differential considerations include hematoma or less likely abscess. Electronically Signed   By: Malachy Moan M.D.   On: 12/24/2022 14:18    Assessment & Plan:   Cellulitis and abscess of upper extremity- The culture is positive for Staph aureus.  This is sensitive to tetracyclines. -     WOUND CULTURE; Future -     Doxycycline Monohydrate; Take 1 capsule (50 mg total) by mouth 2 (two) times daily for 7 days.  Dispense: 14 capsule; Refill: 0  Skin lesion of left arm -     Ambulatory referral to Plastic Surgery     Follow-up: Return in about 3 weeks (around 03/03/2023).  Sanda Linger, MD

## 2023-02-11 ENCOUNTER — Ambulatory Visit: Payer: Medicare PPO | Admitting: Plastic Surgery

## 2023-02-11 ENCOUNTER — Encounter: Payer: Self-pay | Admitting: Plastic Surgery

## 2023-02-11 ENCOUNTER — Telehealth: Payer: Self-pay

## 2023-02-11 ENCOUNTER — Telehealth: Payer: Self-pay | Admitting: *Deleted

## 2023-02-11 VITALS — BP 124/69 | HR 91

## 2023-02-11 DIAGNOSIS — Z7901 Long term (current) use of anticoagulants: Secondary | ICD-10-CM

## 2023-02-11 DIAGNOSIS — L989 Disorder of the skin and subcutaneous tissue, unspecified: Secondary | ICD-10-CM

## 2023-02-11 NOTE — Telephone Encounter (Signed)
Received call from pt's son, Holly Simmons, who reports pt will need surgery to remove a growth on her elbow. He reports pt had apt with plastic surgery today and they would like to talk to cardiology before scheduling the surgery. He is not sure if cardiology or primary care will manage warfarin. He reports pt has been placed on a lovenox bridge in the past. Advised normally the PCP that is managing the pt's warfarin asks the coumadin clinic nurse to create a lovenox bridge schedule. Advised when he has a date for the surgery let coumadin clinic know so a bridge can be completed. He also reports pt was placed on doxycycline which she started yesterday. Advised pt will need INR checked on Friday for potential interactions. [facility] coumadin clinic apt to this Friday. Advised if any other changes to contact the coumadin clinic. Holly Simmons verbalized understanding and read back date and time for apt on Friday for INR check. Holly Simmons verbalized understanding.

## 2023-02-11 NOTE — Progress Notes (Signed)
Referring Provider Etta Grandchild, MD [address] [place],  [place] [address]   CC: No chief complaint on file.     Holly Simmons is an 82 y.o. female.  HPI: Ms. Holly Simmons is a 82 year old female who is referred for evaluation of a 5 x 5 mm lesion on her left elbow.  Per the patient and her family the lesion seems to have grown rapidly over the past 3 to 4 weeks.  She denies any significant pain with the lesion.  She denies any trauma to the area.  Of note the patient has a significant medical history including an aortic valve replacement and is on warfarin for this.  Allergies  Allergen Reactions   Petrolatum-Zinc Oxide Anaphylaxis, Rash and Swelling   Sulfa Antibiotics Rash   Sulfonamide Derivatives Anaphylaxis and Swelling   Tetanus Toxoid     Other reaction(s): Other (See Comments) OTHER REACTION   Tetanus Toxoids Swelling   Other Rash and Other (See Comments)    STERI - STRIPS  It can affect proteins in her kidneys so she doesn't take REACTION: proteinuria Other reaction(s): Other (See Comments), Unknown It can affect proteins in her kidneys so she doesn't take   Nsaids Other (See Comments)    REACTION: proteinuria Other reaction(s): Other (See Comments), Unknown It can affect proteins in her kidneys so she doesn't take    Tetanus Toxoid, Adsorbed Other (See Comments)    Other reaction(s): Other (See Comments) Turned arm red Turned arm red    Outpatient Encounter Medications as of 02/11/2023  Medication Sig Note   allopurinol (ZYLOPRIM) 100 MG tablet TAKE ONE TABLET BY MOUTH DAILY    amoxicillin (AMOXIL) 500 MG capsule TAKE 4 CAPSULES 1 HOUR PRIOR TO PROCEDURE    Betamethasone Valerate 0.12 % foam Apply 1 application topically daily.    brexpiprazole (REXULTI) 0.25 MG TABS tablet Take 1 tablet (0.25 mg total) by mouth daily. Follow-up appt is due in July    buPROPion (WELLBUTRIN XL) 300 MG 24 hr tablet Take 1 tablet (300 mg total) by mouth daily.     doxycycline (MONODOX) 50 MG capsule Take 1 capsule (50 mg total) by mouth 2 (two) times daily for 7 days.    dronabinol (MARINOL) 5 MG capsule Take 1 capsule by mouth two times daily before lunch and supper.    famotidine (PEPCID) 40 MG tablet TAKE ONE TABLET BY MOUTH DAILY    ferrous sulfate 325 (65 FE) MG tablet Take 1 tablet (325 mg total) by mouth 2 (two) times daily with a meal. (Patient taking differently: Take 325-650 mg by mouth daily with breakfast.) 03/24/2022: Taking once daily per nephrologist   HYDROcodone-acetaminophen (NORCO/VICODIN) 5-325 MG tablet Take 1 tablet by mouth every 6 (six) hours as needed.    hydrocortisone (ANUSOL-HC) 25 MG suppository Place 1 suppository (25 mg total) rectally 2 (two) times daily.    ipratropium (ATROVENT) 0.03 % nasal spray USE 2 SPRAYS IN EACH NOSTRIL 3 TIMES A DAY AS NEEDED. (Patient taking differently: Place 2 sprays into both nostrils daily as needed (nose bleed).)    lamoTRIgine (LAMICTAL) 25 MG tablet Take 1 tablet (25 mg total) by mouth daily.    linaclotide (LINZESS) 72 MCG capsule Take 1 capsule (72 mcg total) by mouth daily before breakfast.    metoprolol tartrate (LOPRESSOR) 25 MG tablet TAKE ONE TABLET BY MOUTH TWICE DAILY    modafinil (PROVIGIL) 100 MG tablet Take 1 tablet (100 mg total) by mouth daily. (Patient  taking differently: Take 100 mg by mouth daily as needed (sleep disorder).)    Pitavastatin Calcium 2 MG TABS Take 1 tablet (2 mg total) by mouth daily.    PRESCRIPTION MEDICATION 1 Dose by Other route at bedtime as needed (sleep). CPAP    Propylene Glycol (SYSTANE BALANCE OP) Place 1 drop into both eyes 4 (four) times daily as needed (dry eyes).    SYMBICORT 80-4.5 MCG/ACT inhaler USE 2 PUFFS TWICE A DAY. (Patient taking differently: 2 puffs 2 (two) times daily.)    torsemide (DEMADEX) 20 MG tablet Take 1 tablet (20 mg total) by mouth daily.    Vilazodone HCl (VIIBRYD) 40 MG TABS TAKE ONE TABLET BY MOUTH DAILY    warfarin  (COUMADIN) 1 MG tablet Take 1 tablet (1 mg total) by mouth daily. TAKE 1 1/2 TABLET EVERY WEDNESDAY OR AS DIRECTED BY ANTICOAGULATION LCINIC    warfarin (COUMADIN) 2.5 MG tablet TAKE 1 TABLET BY MOUTH DAILY EXCEPT TAKE 1/2 TABLET ON WEDNESDAYS OR AS DIRECTED BY ANTICOAGULATION CLINIC    No facility-administered encounter medications on file as of 02/11/2023.     Past Medical History:  Diagnosis Date   Allergic rhinitis    Asthma    NOS w/ acute exacerbation   Blood transfusion without reported diagnosis    CKD (chronic kidney disease) stage 3, GFR 30-59 ml/min (HCC) 09/12/2018   Colon polyps    TUBULAR ADENOMAS AND HYPERPLASTIC   Complete heart block (HCC)    requiring PPM (MDT) post surgical myomectomy at [facility],  leads are epicardial with abdominal implant, high ventricular threshold at implant   COPD (chronic obstructive pulmonary disease) (HCC)    Depressive disorder    Diastolic dysfunction    DM (diabetes mellitus) (HCC)    Gallstones    GERD (gastroesophageal reflux disease)    Gout 08/20/2012   Heart murmur    Hyperpotassemia    Hypersomnia    Hypertension    Hypertrophic cardiomyopathy (HCC)    s/p surgical myomectomy at [facility] 11/12 complicated by septal VSD post procedure requiring reoperation with patch closure and tricuspid valve replacement   Kidney stones    Myocardial infarction (HCC) 2011   Obstructive sleep apnea    persistent daytime sleepiness despite cpap   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   Psoriasis    Pyuria    Renal insufficiency    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   Typical atrial flutter (HCC) 9/15    Past Surgical History:  Procedure Laterality Date   ABDOMINAL HYSTERECTOMY     APPENDECTOMY     BREAST CYST EXCISION     CARDIOVERSION N/A 08/01/2014   Procedure: CARDIOVERSION;  Surgeon: Vesta Mixer, MD;  Location: [facility] END[facility];  Service: Cardiovascular;  Laterality: N/A;   CESAREAN SECTION     CHOLECYSTECTOMY     COLONOSCOPY     EYE  SURGERY  01/16/2016   left eye lens implant   EYE SURGERY  01/09/2016   right eye lens implant   INSERT / REPLACE / REMOVE PACEMAKER  07/13/2012   PACEMAKER INSERTION  07/02/2011   epicardial wires with abdominal implant at [facility] 12/12,  high ventricular lead threshold at implant per Dr Christin Fudge   PERMANENT PACEMAKER INSERTION N/A 07/13/2012   Procedure: PERMANENT PACEMAKER INSERTION;  Surgeon: Hillis Range, MD;  Location: [facility] CATH LAB;  Service: Cardiovascular;  Laterality: N/A;   POLYPECTOMY     septal myomectomy for hypertrophic CM  06/23/2011   by Dr Silvestre Mesi  at [facility], complicated by septal VSD requiring patch repair and tricuspid valve replacement   TONSILLECTOMY     TOOTH EXTRACTION     TRICUSPID VALVE REPLACEMENT  06/2011   Medtronic 27mm Mosaic tissue valve   VSD REPAIR  06/2011    Family History  Problem Relation Age of Onset   Hypertension Daughter    Heart disease Maternal Grandfather    Heart disease Paternal Grandfather    Bladder Cancer Father    Prostate cancer Father    Cancer Father    Varicose Veins Father    Heart attack Father    Breast cancer Mother    Dementia Mother    Hypertension Mother    Cancer Mother    Ovarian cancer Maternal Aunt    Pancreatic cancer Cousin    Breast cancer Paternal Aunt    Dementia Maternal Aunt        x 2   Dementia Maternal Uncle        x 2   Colon cancer Neg Hx    Rectal cancer Neg Hx    Stomach cancer Neg Hx    Esophageal cancer Neg Hx     Social History   Social History Narrative   ** Merged History Encounter **    Are you right handed or left handed? left   Are you currently employed ?    What is your current occupation? retired   Do you live at home alone? With daughter   Who lives with you?    What type of home do you live in: 1 story or 2 story? two    Caffeine rarely     Review of Systems General: Denies fevers, chills, weight loss CV: Denies chest pain, shortness of breath, palpitations Skin: Skin  lesion on left elbow is noted  Physical Exam    02/11/2023    2:31 PM 02/10/2023    2:28 PM 12/24/2022    2:30 PM  Vitals with BMI  Height  5\' 3"    Weight  170 lbs   BMI  30.12   Systolic 124 126 161  Diastolic 69 64 62  Pulse 91 94 71    General:  No acute distress,  Alert and oriented, Non-Toxic, Normal speech and affect Integument: There is a 5 x 5 mm excoriated lesion on the left elbow.  It is not fixed to the tissues underneath.  There is no drainage from the lesion at this time. Mammogram: 06/2022 BI-RADS 1 Assessment/Plan Skin lesion: Is unclear what the skin lesion is however biopsy/excision of the lesion is reasonable.  Given the patient's comorbidities, especially her anticoagulation, this will need to be done in the operating room.  I have discussed this with the family.  Because of this she will need clearance from her cardiologist and from her primary care physician.  The family understands that there is a risk of bleeding and that there is a risk that the wounds will not heal and will require prolonged wound management.  Additionally there may be other procedures based on pathology.  All questions were answered to their satisfaction.  Photographs were obtained with Ms. Daponte's consent.  Will schedule at their request.  Santiago Glad 02/11/2023, 2:52 PM

## 2023-02-11 NOTE — Telephone Encounter (Signed)
Request for surgical clearance sent to pt's PCP Dr. Sanda Linger (fax #  [phone]) and pt's cardiologist Dr. Charlton Haws (fax # [phone]) with fax confirmation received

## 2023-02-12 ENCOUNTER — Telehealth: Payer: Self-pay | Admitting: *Deleted

## 2023-02-12 ENCOUNTER — Telehealth: Payer: Self-pay | Admitting: Internal Medicine

## 2023-02-12 NOTE — Telephone Encounter (Signed)
   Pre-operative Risk Assessment    Patient Name: Holly Simmons  DOB: [birth year] MRN: [id number]      Request for Surgical Clearance    Procedure:   EXCISION SKIN LESION OF LEFT ELBOW  Date of Surgery:  Clearance TBD                                 Surgeon:  DR. Weyman Croon Surgeon's Group or Practice Name:  [facility] Phone number:  [phone] Fax number:  [phone]   Type of Clearance Requested:   - Medical  - Pharmacy:  Hold Warfarin (Coumadin)     Type of Anesthesia:  General    Additional requests/questions:    Elpidio Anis   02/12/2023, 9:06 AM

## 2023-02-12 NOTE — Telephone Encounter (Signed)
   Name: Holly Simmons  DOB: 1941/02/09  MRN: [id number]  Primary Cardiologist: Charlton Haws, MD   Preoperative team, please contact this patient and set up a phone call appointment for further preoperative risk assessment. Please obtain consent and complete medication review. Thank you for your help.  I confirm that guidance regarding antiplatelet and oral anticoagulation therapy has been completed and, if necessary, noted below.  We do not manage her coumadin.   Marcelino Duster, PA 02/12/2023, 5:16 PM [facility]

## 2023-02-12 NOTE — Telephone Encounter (Signed)
I s/w the pt and her son. Tele appt has been scheduled for 02/18/23 @ 9:40. Med rec and consent are done.     Patient Consent for Virtual Visit        Holly Simmons has provided verbal consent on 02/12/2023 for a virtual visit (video or telephone).   CONSENT FOR VIRTUAL VISIT FOR:  Holly Simmons  By participating in this virtual visit I agree to the following:  I hereby voluntarily request, consent and authorize [facility] and its employed or contracted physicians, physician assistants, nurse practitioners or other licensed health care professionals (the Practitioner), to provide me with telemedicine health care services (the "Services") as deemed necessary by the treating Practitioner. I acknowledge and consent to receive the Services by the Practitioner via telemedicine. I understand that the telemedicine visit will involve communicating with the Practitioner through live audiovisual communication technology and the disclosure of certain medical information by electronic transmission. I acknowledge that I have been given the opportunity to request an in-person assessment or other available alternative prior to the telemedicine visit and am voluntarily participating in the telemedicine visit.  I understand that I have the right to withhold or withdraw my consent to the use of telemedicine in the course of my care at any time, without affecting my right to future care or treatment, and that the Practitioner or I may terminate the telemedicine visit at any time. I understand that I have the right to inspect all information obtained and/or recorded in the course of the telemedicine visit and may receive copies of available information for a reasonable fee.  I understand that some of the potential risks of receiving the Services via telemedicine include:  Delay or interruption in medical evaluation due to technological equipment failure or disruption; Information transmitted may  not be sufficient (e.g. poor resolution of images) to allow for appropriate medical decision making by the Practitioner; and/or  In rare instances, security protocols could fail, causing a breach of personal health information.  Furthermore, I acknowledge that it is my responsibility to provide information about my medical history, conditions and care that is complete and accurate to the best of my ability. I acknowledge that Practitioner's advice, recommendations, and/or decision may be based on factors not within their control, such as incomplete or inaccurate data provided by me or distortions of diagnostic images or specimens that may result from electronic transmissions. I understand that the practice of medicine is not an exact science and that Practitioner makes no warranties or guarantees regarding treatment outcomes. I acknowledge that a copy of this consent can be made available to me via my patient portal [facility] MyChart), or I can request a printed copy by calling the office of  [facility].    I understand that my insurance will be billed for this visit.   I have read or had this consent read to me. I understand the contents of this consent, which adequately explains the benefits and risks of the Services being provided via telemedicine.  I have been provided ample opportunity to ask questions regarding this consent and the Services and have had my questions answered to my satisfaction. I give my informed consent for the services to be provided through the use of telemedicine in my medical care

## 2023-02-12 NOTE — Telephone Encounter (Signed)
Surgical clearance given to PCP to review and sign

## 2023-02-12 NOTE — Telephone Encounter (Signed)
I s/w the pt and her son. Tele appt has been scheduled for 02/18/23 @ 9:40. Med rec and consent are done.

## 2023-02-12 NOTE — Telephone Encounter (Signed)
We have received surgical clearance PW from '[facility]' and it has been placed in the provider's box.   Once completed please fax form to:  [phone]

## 2023-02-13 ENCOUNTER — Encounter: Payer: Self-pay | Admitting: Plastic Surgery

## 2023-02-13 ENCOUNTER — Ambulatory Visit: Payer: Medicare PPO

## 2023-02-13 DIAGNOSIS — Z7901 Long term (current) use of anticoagulants: Secondary | ICD-10-CM

## 2023-02-13 LAB — POCT INR: INR: 2.2 (ref 2.0–3.0)

## 2023-02-13 NOTE — Patient Instructions (Addendum)
Pre visit review using our clinic review tool, if applicable. No additional management support is needed unless otherwise documented below in the visit note.  Continue 1 tablet daily except take 1/2 tablet on Mondays. Recheck in 4 weeks.  

## 2023-02-13 NOTE — Telephone Encounter (Signed)
Surgical clearance form has been signed and faxed back.  

## 2023-02-13 NOTE — Progress Notes (Signed)
Pt has growth on elbow and plastic surgeon will remove. Surgery has not been scheduled yet. Pt has currently been placed on doxycycline x 1 week. Pt has surgery clearance apt with cardiology on 7/17. Unsure if they will manage warfarin around surgery or coumadin clinic will. Pt's son will let this coumadin clinic know future plans.   Pt's daughter, Enrique Sack, brought pictures of the growth on pt's elbow. They reported the growth has fallen off and it appears to be healing. They are not sure if pt still needs surgery. Advised to send pictures via mychart to surgeon for follow up. Enrique Sack will f/u with surgeon.   Continue 1 tablet daily except take 1/2 tablet on Mondays. Recheck in 4 weeks.

## 2023-02-14 LAB — WOUND CULTURE

## 2023-02-16 ENCOUNTER — Telehealth: Payer: Self-pay | Admitting: *Deleted

## 2023-02-16 NOTE — Telephone Encounter (Unsigned)
Surgical clearance received from Dr. Sanda Linger office: "pt is cleared for surgery. Pt to hold coumadin 4 days before surgery and 1 day after

## 2023-02-16 NOTE — Telephone Encounter (Signed)
Hi, I spoke with Dr. Ladona Ridgel. Since this patient feels as though this may be resolving, he wants to see her in clinic to determine next steps.  Can you make an appointment for her to see him specifically in the next two weeks? Thank you

## 2023-02-18 ENCOUNTER — Ambulatory Visit: Payer: Medicare PPO | Attending: Cardiology

## 2023-02-18 ENCOUNTER — Ambulatory Visit: Payer: Medicare PPO | Admitting: Plastic Surgery

## 2023-02-18 ENCOUNTER — Encounter: Payer: Self-pay | Admitting: Plastic Surgery

## 2023-02-18 VITALS — BP 95/58 | HR 69

## 2023-02-18 DIAGNOSIS — Z01810 Encounter for preprocedural cardiovascular examination: Secondary | ICD-10-CM | POA: Diagnosis not present

## 2023-02-18 DIAGNOSIS — L989 Disorder of the skin and subcutaneous tissue, unspecified: Secondary | ICD-10-CM

## 2023-02-18 NOTE — Progress Notes (Signed)
Holly Simmons is seen in follow-up today for a lesion on the posterior aspect of her left elbow.  This was initially felt to be a malignancy and the patient was being scheduled for surgery however since I saw her last week the top portion of the lesion fell off and there is a 5 mm x 5 mm lesion with an irregular surface.  It is unclear whether this is granulation tissue from a healing infection or whether this still represents a malignancy.   Since it has improved both the patient and her son would like to give it a little bit of time to see if it continues to heal and if it does we will follow it clinically.  If it remains the same, meaning an unhealed wound (I have recommended that she still have it excised for pathologic diagnosis.  They are both in agreement with this plan and will return to see me in 2 weeks.

## 2023-02-18 NOTE — Telephone Encounter (Signed)
Surgical clearance note in Epic from Edd Fabian, NP dated 02/18/23. Dr Ladona Ridgel made aware

## 2023-02-18 NOTE — Progress Notes (Signed)
Virtual Visit via Telephone Note   Because of Holly Simmons's co-morbid illnesses, she is at least at moderate risk for complications without adequate follow up.  This format is felt to be most appropriate for this patient at this time.  The patient did not have access to video technology/had technical difficulties with video requiring transitioning to audio format only (telephone).  All issues noted in this document were discussed and addressed.  No physical exam could be performed with this format.  Please refer to the patient's chart for her consent to telehealth for [facility].  Evaluation Performed:  Preoperative cardiovascular risk assessment _____________   Date:  02/18/2023   Patient ID:  Holly Simmons, DOB 1941/06/06, MRN [id number] Patient Location:  Home Provider location:   Office  Primary Care Provider:  Etta Grandchild, MD Primary Cardiologist:  Charlton Haws, MD  Chief Complaint / Patient Profile   82 y.o. y/o female with a h/o hypertrophic cardiomyopathy, ventral septal defect, paroxysmal atrial fibrillation who is pending excision skin lesion of the left elbow and presents today for telephonic preoperative cardiovascular risk assessment.  History of Present Illness    Holly Simmons is a 82 y.o. female who presents via audio/video conferencing for a telehealth visit today.  Pt was last seen in cardiology clinic on 10/13/2022 by Dr. Nelly Simmons.  At that time DEBORRAH Simmons was doing well .  The patient is now pending procedure as outlined above. Since her last visit, she remains stable from a cardiac standpoint.  Today she denies chest pain, shortness of breath, lower extremity edema, fatigue, palpitations, melena, hematuria, hemoptysis, diaphoresis, weakness, presyncope, syncope, orthopnea, and PND.   Past Medical History    Past Medical History:  Diagnosis Date   Allergic rhinitis    Asthma    NOS w/ acute exacerbation   Blood  transfusion without reported diagnosis    CKD (chronic kidney disease) stage 3, GFR 30-59 ml/min (HCC) 09/12/2018   Colon polyps    TUBULAR ADENOMAS AND HYPERPLASTIC   Complete heart block (HCC)    requiring PPM (MDT) post surgical myomectomy at [facility],  leads are epicardial with abdominal implant, high ventricular threshold at implant   COPD (chronic obstructive pulmonary disease) (HCC)    Depressive disorder    Diastolic dysfunction    DM (diabetes mellitus) (HCC)    Gallstones    GERD (gastroesophageal reflux disease)    Gout 08/20/2012   Heart murmur    Hyperpotassemia    Hypersomnia    Hypertension    Hypertrophic cardiomyopathy (HCC)    s/p surgical myomectomy at [facility] 11/12 complicated by septal VSD post procedure requiring reoperation with patch closure and tricuspid valve replacement   Kidney stones    Myocardial infarction (HCC) 2011   Obstructive sleep apnea    persistent daytime sleepiness despite cpap   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   Psoriasis    Pyuria    Renal insufficiency    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   Typical atrial flutter (HCC) 9/15   Past Surgical History:  Procedure Laterality Date   ABDOMINAL HYSTERECTOMY     APPENDECTOMY     BREAST CYST EXCISION     CARDIOVERSION N/A 08/01/2014   Procedure: CARDIOVERSION;  Surgeon: Holly Mixer, MD;  Location: [facility] ENDOSCOPY;  Service: Cardiovascular;  Laterality: N/A;   CESAREAN SECTION     CHOLECYSTECTOMY     COLONOSCOPY     EYE SURGERY  01/16/2016  left eye lens implant   EYE SURGERY  01/09/2016   right eye lens implant   INSERT / REPLACE / REMOVE PACEMAKER  07/13/2012   PACEMAKER INSERTION  07/02/2011   epicardial wires with abdominal implant at [facility] 12/12,  high ventricular lead threshold at implant per Dr Christin Fudge   PERMANENT PACEMAKER INSERTION N/A 07/13/2012   Procedure: PERMANENT PACEMAKER INSERTION;  Surgeon: Holly Range, MD;  Location: [facility] CATH LAB;  Service: Cardiovascular;  Laterality:  N/A;   POLYPECTOMY     septal myomectomy for hypertrophic CM  06/23/2011   by Dr Silvestre Mesi at [facility], complicated by septal VSD requiring patch repair and tricuspid valve replacement   TONSILLECTOMY     TOOTH EXTRACTION     TRICUSPID VALVE REPLACEMENT  06/2011   Medtronic 27mm Mosaic tissue valve   VSD REPAIR  06/2011    Allergies  Allergies  Allergen Reactions   Petrolatum-Zinc Oxide Anaphylaxis, Rash and Swelling   Sulfa Antibiotics Rash   Sulfonamide Derivatives Anaphylaxis and Swelling   Tetanus Toxoid     Other reaction(s): Other (See Comments) OTHER REACTION   Tetanus Toxoids Swelling   Other Rash and Other (See Comments)    STERI - STRIPS  It can affect proteins in her kidneys so she doesn't take REACTION: proteinuria Other reaction(s): Other (See Comments), Unknown It can affect proteins in her kidneys so she doesn't take   Nsaids Other (See Comments)    REACTION: proteinuria Other reaction(s): Other (See Comments), Unknown It can affect proteins in her kidneys so she doesn't take    Tetanus Toxoid, Adsorbed Other (See Comments)    Other reaction(s): Other (See Comments) Turned arm red Turned arm red    Home Medications    Prior to Admission medications   Medication Sig Start Date End Date Taking? Authorizing Provider  allopurinol (ZYLOPRIM) 100 MG tablet TAKE ONE TABLET BY MOUTH DAILY 12/07/22   Etta Grandchild, MD  amoxicillin (AMOXIL) 500 MG capsule TAKE 4 CAPSULES 1 HOUR PRIOR TO PROCEDURE 01/10/21   Wendall Stade, MD  Betamethasone Valerate 0.12 % foam Apply 1 application topically daily. 01/31/23   Etta Grandchild, MD  brexpiprazole (REXULTI) 0.25 MG TABS tablet Take 1 tablet (0.25 mg total) by mouth daily. Follow-up appt is due in July 01/30/23   Etta Grandchild, MD  buPROPion (WELLBUTRIN XL) 300 MG 24 hr tablet Take 1 tablet (300 mg total) by mouth daily. 01/30/23   Etta Grandchild, MD  dronabinol (MARINOL) 5 MG capsule Take 1 capsule by mouth two times daily  before lunch and supper. 12/08/22   Etta Grandchild, MD  famotidine (PEPCID) 40 MG tablet TAKE ONE TABLET BY MOUTH DAILY 12/07/22   Etta Grandchild, MD  ferrous sulfate 325 (65 FE) MG tablet Take 1 tablet (325 mg total) by mouth 2 (two) times daily with a meal. Patient taking differently: Take 325-650 mg by mouth daily with breakfast. 11/10/20   Etta Grandchild, MD  HYDROcodone-acetaminophen (NORCO/VICODIN) 5-325 MG tablet Take 1 tablet by mouth every 6 (six) hours as needed. 12/24/22   Clark, Meghan R, PA-C  hydrocortisone (ANUSOL-HC) 25 MG suppository Place 1 suppository (25 mg total) rectally 2 (two) times daily. 10/23/22   Wallis Bamberg, PA-C  ipratropium (ATROVENT) 0.03 % nasal spray USE 2 SPRAYS IN EACH NOSTRIL 3 TIMES A DAY AS NEEDED. Patient taking differently: Place 2 sprays into both nostrils daily as needed (nose bleed). 04/29/18   Coralyn Helling, MD  lamoTRIgine (LAMICTAL)  25 MG tablet Take 1 tablet (25 mg total) by mouth daily. 01/30/23   Etta Grandchild, MD  linaclotide [facility]) 72 MCG capsule Take 1 capsule (72 mcg total) by mouth daily before breakfast. 10/09/22   Etta Grandchild, MD  metoprolol tartrate (LOPRESSOR) 25 MG tablet TAKE ONE TABLET BY MOUTH TWICE DAILY 10/06/22   Etta Grandchild, MD  modafinil (PROVIGIL) 100 MG tablet Take 1 tablet (100 mg total) by mouth daily. Patient taking differently: Take 100 mg by mouth daily as needed (sleep disorder). 03/29/18   Coralyn Helling, MD  Pitavastatin Calcium 2 MG TABS Take 1 tablet (2 mg total) by mouth daily. 10/06/22   Etta Grandchild, MD  PRESCRIPTION MEDICATION 1 Dose by Other route at bedtime as needed (sleep). CPAP    [provider]  Propylene Glycol (SYSTANE BALANCE OP) Place 1 drop into both eyes 4 (four) times daily as needed (dry eyes).    [provider]  SYMBICORT 80-4.5 MCG/ACT inhaler USE 2 PUFFS TWICE A DAY. Patient taking differently: 2 puffs 2 (two) times daily. 10/11/20   Etta Grandchild, MD  torsemide (DEMADEX) 20 MG  tablet Take 1 tablet (20 mg total) by mouth daily. 04/09/22   Etta Grandchild, MD  Vilazodone HCl (VIIBRYD) 40 MG TABS TAKE ONE TABLET BY MOUTH DAILY 12/07/22   Etta Grandchild, MD  warfarin (COUMADIN) 1 MG tablet Take 1 tablet (1 mg total) by mouth daily. TAKE 1 1/2 TABLET EVERY WEDNESDAY OR AS DIRECTED BY ANTICOAGULATION LCINIC 02/26/22   Etta Grandchild, MD  warfarin (COUMADIN) 2.5 MG tablet TAKE 1 TABLET BY MOUTH DAILY EXCEPT TAKE 1/2 TABLET ON WEDNESDAYS OR AS DIRECTED BY ANTICOAGULATION CLINIC 08/08/22   Etta Grandchild, MD    Physical Exam    Vital Signs:  [name] does not have vital signs available for review today.  Given telephonic nature of communication, physical exam is limited. AAOx3. NAD. Normal affect.  Speech and respirations are unlabored.  Accessory Clinical Findings    None  Assessment & Plan    1.  Preoperative Cardiovascular Risk Assessment:EXCISION SKIN LESION OF LEFT ELBOW ,  DR. Weyman Croon ,[facility], [phone]        Primary Cardiologist: Charlton Haws, MD  Chart reviewed as part of pre-operative protocol coverage. Given past medical history and time since last visit, based on ACC/AHA guidelines, ESPARANZA KRIDER would be at acceptable risk for the planned procedure without further cardiovascular testing.   Her Coumadin is not managed by cardiology.  Recommendations for holding Coumadin will need to come from prescribing provider.  Patient was advised that if she develops new symptoms prior to surgery to contact our office to arrange a follow-up appointment.  He verbalized understanding.    I will route this recommendation to the requesting party via Epic fax function and remove from pre-op pool.       Time:   Today, I have spent 5 minutes with the patient with telehealth technology discussing medical history, symptoms, and management plan.  Prior to her phone evaluation I spent greater than 10 minutes  reviewing her past medical history and cardiac medications.   Ronney Asters, NP  02/18/2023, 7:00 AM

## 2023-02-20 ENCOUNTER — Ambulatory Visit: Payer: Medicare PPO

## 2023-02-25 DIAGNOSIS — N189 Chronic kidney disease, unspecified: Secondary | ICD-10-CM | POA: Diagnosis not present

## 2023-02-25 DIAGNOSIS — N184 Chronic kidney disease, stage 4 (severe): Secondary | ICD-10-CM | POA: Diagnosis not present

## 2023-02-25 LAB — COMPREHENSIVE METABOLIC PANEL
Albumin: 4.1 (ref 3.5–5.0)
Calcium: 9.7 (ref 8.7–10.7)
eGFR: 27

## 2023-02-25 LAB — IRON,TIBC AND FERRITIN PANEL
%SAT: 43
Iron: 126
TIBC: 292
UIBC: 166

## 2023-02-25 LAB — VITAMIN D 25 HYDROXY (VIT D DEFICIENCY, FRACTURES): Vit D, 25-Hydroxy: 26.7

## 2023-02-25 LAB — CBC AND DIFFERENTIAL
HCT: 36 (ref 36–46)
Hemoglobin: 11.6 — AB (ref 12.0–16.0)
Platelets: 139 10*3/uL — AB (ref 150–400)
WBC: 5

## 2023-02-25 LAB — BASIC METABOLIC PANEL
BUN: 26 — AB (ref 4–21)
CO2: 25 — AB (ref 13–22)
Chloride: 102 (ref 99–108)
Creatinine: 1.9 — AB (ref 0.5–1.1)
Glucose: 108
Potassium: 4.8 mEq/L (ref 3.5–5.1)
Sodium: 139 (ref 137–147)

## 2023-02-26 ENCOUNTER — Encounter: Payer: Self-pay | Admitting: Internal Medicine

## 2023-03-03 ENCOUNTER — Ambulatory Visit: Payer: Medicare PPO | Admitting: Internal Medicine

## 2023-03-03 ENCOUNTER — Encounter: Payer: Self-pay | Admitting: Internal Medicine

## 2023-03-03 VITALS — BP 128/76 | HR 90 | Temp 98.3°F | Resp 16 | Ht 63.0 in | Wt 167.0 lb

## 2023-03-03 DIAGNOSIS — L02419 Cutaneous abscess of limb, unspecified: Secondary | ICD-10-CM

## 2023-03-03 DIAGNOSIS — G301 Alzheimer's disease with late onset: Secondary | ICD-10-CM | POA: Diagnosis not present

## 2023-03-03 DIAGNOSIS — F02C11 Dementia in other diseases classified elsewhere, severe, with agitation: Secondary | ICD-10-CM | POA: Diagnosis not present

## 2023-03-03 DIAGNOSIS — L03119 Cellulitis of unspecified part of limb: Secondary | ICD-10-CM | POA: Diagnosis not present

## 2023-03-03 DIAGNOSIS — R64 Cachexia: Secondary | ICD-10-CM

## 2023-03-03 NOTE — Progress Notes (Unsigned)
Subjective:  Patient ID: Holly Simmons, female    DOB: 1941/04/29  Age: 82 y.o. MRN: [id number]  CC: Anemia   HPI AWA THEROUX presents for f/up -  She denies recent trauma/injury but has had spontaneous asymptomatic bruising.  Outpatient Medications Prior to Visit  Medication Sig Dispense Refill   allopurinol (ZYLOPRIM) 100 MG tablet TAKE ONE TABLET BY MOUTH DAILY 90 tablet 1   amoxicillin (AMOXIL) 500 MG capsule TAKE 4 CAPSULES 1 HOUR PRIOR TO PROCEDURE 30 capsule 0   Betamethasone Valerate 0.12 % foam Apply 1 application topically daily. 100 g 0   brexpiprazole (REXULTI) 0.25 MG TABS tablet Take 1 tablet (0.25 mg total) by mouth daily. Follow-up appt is due in July 30 tablet 0   buPROPion (WELLBUTRIN XL) 300 MG 24 hr tablet Take 1 tablet (300 mg total) by mouth daily. 90 tablet 0   dronabinol (MARINOL) 5 MG capsule Take 1 capsule by mouth two times daily before lunch and supper. 180 capsule 1   famotidine (PEPCID) 40 MG tablet TAKE ONE TABLET BY MOUTH DAILY 90 tablet 1   ferrous sulfate 325 (65 FE) MG tablet Take 1 tablet (325 mg total) by mouth 2 (two) times daily with a meal. (Patient taking differently: Take 325-650 mg by mouth daily with breakfast.) 180 tablet 1   HYDROcodone-acetaminophen (NORCO/VICODIN) 5-325 MG tablet Take 1 tablet by mouth every 6 (six) hours as needed. 14 tablet 0   hydrocortisone (ANUSOL-HC) 25 MG suppository Place 1 suppository (25 mg total) rectally 2 (two) times daily. 14 suppository 0   ipratropium (ATROVENT) 0.03 % nasal spray USE 2 SPRAYS IN EACH NOSTRIL 3 TIMES A DAY AS NEEDED. (Patient taking differently: Place 2 sprays into both nostrils daily as needed (nose bleed).) 30 mL 0   lamoTRIgine (LAMICTAL) 25 MG tablet Take 1 tablet (25 mg total) by mouth daily. 90 tablet 0   linaclotide (LINZESS) 72 MCG capsule Take 1 capsule (72 mcg total) by mouth daily before breakfast. 90 capsule 1   metoprolol tartrate (LOPRESSOR) 25 MG tablet  TAKE ONE TABLET BY MOUTH TWICE DAILY 180 tablet 1   modafinil (PROVIGIL) 100 MG tablet Take 1 tablet (100 mg total) by mouth daily. (Patient taking differently: Take 100 mg by mouth daily as needed (sleep disorder).) 30 tablet 5   Pitavastatin Calcium 2 MG TABS Take 1 tablet (2 mg total) by mouth daily. 90 tablet 1   PRESCRIPTION MEDICATION 1 Dose by Other route at bedtime as needed (sleep). CPAP     Propylene Glycol (SYSTANE BALANCE OP) Place 1 drop into both eyes 4 (four) times daily as needed (dry eyes).     SYMBICORT 80-4.5 MCG/ACT inhaler USE 2 PUFFS TWICE A DAY. (Patient taking differently: 2 puffs 2 (two) times daily.) 30.6 g 5   torsemide (DEMADEX) 20 MG tablet Take 1 tablet (20 mg total) by mouth daily. 90 tablet 1   Vilazodone HCl (VIIBRYD) 40 MG TABS TAKE ONE TABLET BY MOUTH DAILY 90 tablet 1   warfarin (COUMADIN) 1 MG tablet Take 1 tablet (1 mg total) by mouth daily. TAKE 1 1/2 TABLET EVERY WEDNESDAY OR AS DIRECTED BY ANTICOAGULATION LCINIC 20 tablet 0   warfarin (COUMADIN) 2.5 MG tablet TAKE 1 TABLET BY MOUTH DAILY EXCEPT TAKE 1/2 TABLET ON WEDNESDAYS OR AS DIRECTED BY ANTICOAGULATION CLINIC 180 tablet 1   No facility-administered medications prior to visit.    ROS Review of Systems  Constitutional:  Positive for fatigue.  Negative for diaphoresis and unexpected weight change.  HENT: Negative.    Eyes: Negative.   Respiratory:  Negative for cough and shortness of breath.   Cardiovascular:  Negative for chest pain, palpitations and leg swelling.  Gastrointestinal:  Negative for abdominal pain, blood in stool, constipation and nausea.  Genitourinary: Negative.  Negative for dysuria, flank pain and hematuria.  Musculoskeletal:  Positive for back pain. Negative for arthralgias and myalgias.  Skin:  Positive for wound.  Neurological: Negative.  Negative for dizziness, weakness, light-headedness and headaches.  Hematological:  Negative for adenopathy. Bruises/bleeds easily.   Psychiatric/Behavioral:  Positive for confusion, dysphoric mood and sleep disturbance. Negative for agitation and suicidal ideas. The patient is nervous/anxious.     Objective:  BP 128/76 (BP Location: Right Arm, Patient Position: Sitting, Cuff Size: Large)   Pulse 90   Temp 98.3 F (36.8 C) (Oral)   Resp 16   Ht 5\' 3"  (1.6 m)   Wt 167 lb (75.8 kg)   SpO2 98%   BMI 29.58 kg/m   BP Readings from Last 3 Encounters:  03/03/23 128/76  02/18/23 (!) 95/58  02/11/23 124/69    Wt Readings from Last 3 Encounters:  03/03/23 167 lb (75.8 kg)  02/10/23 170 lb (77.1 kg)  12/24/22 160 lb (72.6 kg)    Physical Exam Vitals reviewed.  Constitutional:      General: She is not in acute distress.    Appearance: She is ill-appearing. She is not toxic-appearing or diaphoretic.  HENT:     Mouth/Throat:     Mouth: Mucous membranes are moist.  Eyes:     General: No scleral icterus.    Conjunctiva/sclera: Conjunctivae normal.  Cardiovascular:     Rate and Rhythm: Normal rate and regular rhythm.     Heart sounds: Murmur heard.     Systolic murmur is present with a grade of 2/6.  Pulmonary:     Breath sounds: Examination of the right-lower field reveals rales. Examination of the left-lower field reveals rales. Rales present. No decreased breath sounds, wheezing or rhonchi.  Abdominal:     General: Bowel sounds are normal.     Palpations: There is no mass.     Tenderness: There is no abdominal tenderness. There is no guarding.     Hernia: No hernia is present.  Musculoskeletal:     Right lower leg: No edema.     Left lower leg: No edema.  Skin:    General: Skin is warm and dry.     Findings: Bruising and lesion present.  Neurological:     Mental Status: She is alert. Mental status is at baseline.     Lab Results  Component Value Date   WBC 5.0 02/25/2023   HGB 11.6 (A) 02/25/2023   HCT 36 02/25/2023   PLT 139 (A) 02/25/2023   GLUCOSE 104 (H) 11/19/2022   CHOL 129 03/19/2022    TRIG 40 03/19/2022   HDL 47 03/19/2022   LDLCALC 74 03/19/2022   ALT 12 03/18/2022   AST 16 03/18/2022   NA 139 02/25/2023   K 4.8 02/25/2023   CL 102 02/25/2023   CREATININE 1.9 (A) 02/25/2023   BUN 26 (A) 02/25/2023   CO2 25 (A) 02/25/2023   TSH 2.67 10/06/2022   INR 2.2 02/13/2023   HGBA1C 5.3 03/18/2022   MICROALBUR 63.9 (H) 04/20/2018    US Venous Img Lower Unilateral Left  Result Date: 12/24/2022 CLINICAL DATA:  Left ankle pain, palpable nodule EXAM: LEFT  LOWER EXTREMITY VENOUS DOPPLER ULTRASOUND TECHNIQUE: Gray-scale sonography with compression, as well as color and duplex ultrasound, were performed to evaluate the deep venous system(s) from the level of the common femoral vein through the popliteal and proximal calf veins. COMPARISON:  None Available. FINDINGS: VENOUS Normal compressibility of the common femoral, superficial femoral, and popliteal veins, as well as the visualized calf veins. Visualized portions of profunda femoral vein and great saphenous vein unremarkable. No filling defects to suggest DVT on grayscale or color Doppler imaging. Doppler waveforms show normal direction of venous flow, normal respiratory plasticity and response to augmentation. Limited views of the contralateral common femoral vein are unremarkable. OTHER Sonographic interrogation of the region of clinical concern demonstrates a 2.1 x 0.6 x 1.5 cm heterogeneous hypoechoic fluid collection. No evidence of internal vascularity. Limitations: none IMPRESSION: 1. No evidence of deep venous thrombosis. 2. Approximately 2.1 x 0.6 x 1.5 cm irregular ovoid hypoechoic collection in the superficial soft tissues of the medial left ankle. No evidence of internal vascularity. Differential considerations include hematoma or less likely abscess. Electronically Signed   By: Malachy Moan M.D.   On: 12/24/2022 14:18    Assessment & Plan:   Cellulitis and abscess of upper extremity- This has resolved.  Cachexia  (HCC)- She is not losing weight.  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     Follow-up: No follow-ups on file.  Sanda Linger, MD

## 2023-03-04 ENCOUNTER — Ambulatory Visit: Payer: Medicare PPO | Admitting: Plastic Surgery

## 2023-03-04 DIAGNOSIS — N2581 Secondary hyperparathyroidism of renal origin: Secondary | ICD-10-CM | POA: Diagnosis not present

## 2023-03-04 DIAGNOSIS — I129 Hypertensive chronic kidney disease with stage 1 through stage 4 chronic kidney disease, or unspecified chronic kidney disease: Secondary | ICD-10-CM | POA: Diagnosis not present

## 2023-03-04 DIAGNOSIS — I5189 Other ill-defined heart diseases: Secondary | ICD-10-CM | POA: Diagnosis not present

## 2023-03-04 DIAGNOSIS — D631 Anemia in chronic kidney disease: Secondary | ICD-10-CM | POA: Diagnosis not present

## 2023-03-04 DIAGNOSIS — E559 Vitamin D deficiency, unspecified: Secondary | ICD-10-CM | POA: Diagnosis not present

## 2023-03-04 DIAGNOSIS — N184 Chronic kidney disease, stage 4 (severe): Secondary | ICD-10-CM | POA: Diagnosis not present

## 2023-03-13 ENCOUNTER — Ambulatory Visit: Payer: Medicare PPO

## 2023-03-13 DIAGNOSIS — Z7901 Long term (current) use of anticoagulants: Secondary | ICD-10-CM

## 2023-03-13 LAB — POCT INR: INR: 2.7 (ref 2.0–3.0)

## 2023-03-13 NOTE — Patient Instructions (Addendum)
Pre visit review using our clinic review tool, if applicable. No additional management support is needed unless otherwise documented below in the visit note. ? ?Continue 1 tablet daily except take 1/2 tablet on Mondays.  Re-check in 5  weeks.   ?

## 2023-03-13 NOTE — Progress Notes (Signed)
Continue 1 tablet daily except take 1/2 tablet on Mondays. Recheck in 5 weeks.

## 2023-03-19 ENCOUNTER — Encounter (INDEPENDENT_AMBULATORY_CARE_PROVIDER_SITE_OTHER): Payer: Self-pay

## 2023-03-23 ENCOUNTER — Other Ambulatory Visit: Payer: Self-pay | Admitting: Internal Medicine

## 2023-03-23 DIAGNOSIS — K5904 Chronic idiopathic constipation: Secondary | ICD-10-CM

## 2023-03-31 ENCOUNTER — Other Ambulatory Visit: Payer: Self-pay | Admitting: Internal Medicine

## 2023-03-31 DIAGNOSIS — F02C11 Dementia in other diseases classified elsewhere, severe, with agitation: Secondary | ICD-10-CM

## 2023-03-31 DIAGNOSIS — F332 Major depressive disorder, recurrent severe without psychotic features: Secondary | ICD-10-CM

## 2023-04-02 ENCOUNTER — Other Ambulatory Visit: Payer: Self-pay | Admitting: Internal Medicine

## 2023-04-02 DIAGNOSIS — I5032 Chronic diastolic (congestive) heart failure: Secondary | ICD-10-CM

## 2023-04-02 DIAGNOSIS — E785 Hyperlipidemia, unspecified: Secondary | ICD-10-CM

## 2023-04-02 DIAGNOSIS — R600 Localized edema: Secondary | ICD-10-CM

## 2023-04-02 DIAGNOSIS — L409 Psoriasis, unspecified: Secondary | ICD-10-CM

## 2023-04-07 ENCOUNTER — Ambulatory Visit (INDEPENDENT_AMBULATORY_CARE_PROVIDER_SITE_OTHER): Payer: Medicare PPO

## 2023-04-07 DIAGNOSIS — I442 Atrioventricular block, complete: Secondary | ICD-10-CM

## 2023-04-08 ENCOUNTER — Ambulatory Visit: Payer: Medicare PPO | Admitting: Physician Assistant

## 2023-04-08 LAB — CUP PACEART REMOTE DEVICE CHECK
Battery Impedance: 2398 Ohm
Battery Remaining Longevity: 27 mo
Battery Voltage: 2.73 V
Brady Statistic AP VP Percent: 100 %
Brady Statistic AP VS Percent: 0 %
Brady Statistic AS VP Percent: 0 %
Brady Statistic AS VS Percent: 0 %
Date Time Interrogation Session: 20240903090521
Implantable Lead Connection Status: 753985
Implantable Lead Connection Status: 753985
Implantable Lead Implant Date: 20131210
Implantable Lead Implant Date: 20131210
Implantable Lead Location: 753858
Implantable Lead Location: 753859
Implantable Lead Model: 4296
Implantable Lead Model: 5076
Implantable Pulse Generator Implant Date: 20131210
Lead Channel Impedance Value: 1012 Ohm
Lead Channel Impedance Value: 474 Ohm
Lead Channel Pacing Threshold Amplitude: 0.75 V
Lead Channel Pacing Threshold Amplitude: 0.875 V
Lead Channel Pacing Threshold Pulse Width: 0.4 ms
Lead Channel Pacing Threshold Pulse Width: 0.4 ms
Lead Channel Setting Pacing Amplitude: 2 V
Lead Channel Setting Pacing Amplitude: 2.5 V
Lead Channel Setting Pacing Pulse Width: 0.4 ms
Lead Channel Setting Sensing Sensitivity: 8 mV
Zone Setting Status: 755011
Zone Setting Status: 755011

## 2023-04-15 ENCOUNTER — Ambulatory Visit: Payer: Medicare PPO | Admitting: Physician Assistant

## 2023-04-15 ENCOUNTER — Encounter: Payer: Self-pay | Admitting: Physician Assistant

## 2023-04-15 VITALS — BP 117/61 | HR 89 | Resp 20 | Wt 170.0 lb

## 2023-04-15 DIAGNOSIS — F03918 Unspecified dementia, unspecified severity, with other behavioral disturbance: Secondary | ICD-10-CM

## 2023-04-15 NOTE — Patient Instructions (Signed)
It was a pleasure to see you today at our office.   Recommendations:  Follow up in 6  months Continue [place]  Whom to call:  Memory  decline, memory medications: Call our office [phone]   For psychiatric meds, mood meds: Please have your primary care physician manage these medications.   Counseling regarding caregiver distress, including caregiver depression, anxiety and issues regarding community resources, adult day care programs, adult living facilities, or memory care questions:   Feel free to contact Misty Lisabeth Register, Social Worker at [phone]   For assessment of decision of mental capacity and competency:  Call Dr. Erick Blinks, geriatric psychiatrist at [phone]  For guidance in geriatric dementia issues please call [facility] [phone]     Feel free to visit Facebook page " [email or web address]" for tips of how to care for people with memory problems.      RECOMMENDATIONS FOR ALL PATIENTS WITH MEMORY PROBLEMS: 1. Continue to exercise (Recommend 30 minutes of walking everyday, or 3 hours every week) 2. Increase social interactions - continue going to [place] and enjoy social gatherings with friends and family 3. Eat healthy, avoid fried foods and eat more fruits and vegetables 4. Maintain adequate blood pressure, blood sugar, and blood cholesterol level. Reducing the risk of stroke and cardiovascular disease also helps promoting better memory. 5. Avoid stressful situations. Live a simple life and avoid aggravations. Organize your time and prepare for the next day in anticipation. 6. Sleep well, avoid any interruptions of sleep and avoid any distractions in the bedroom that may interfere with adequate sleep quality 7. Avoid sugar, avoid sweets as there is a strong link between excessive sugar intake, diabetes, and cognitive impairment We discussed the Mediterranean diet, which has been shown to help patients reduce the risk of progressive memory disorders  and reduces cardiovascular risk. This includes eating fish, eat fruits and green leafy vegetables, nuts like almonds and hazelnuts, walnuts, and also use olive oil. Avoid fast foods and fried foods as much as possible. Avoid sweets and sugar as sugar use has been linked to worsening of memory function.  There is always a concern of gradual progression of memory problems. If this is the case, then we may need to adjust level of care according to patient needs. Support, both to the patient and caregiver, should then be put into place.    The Alzheimer's Association is here all day, every day for people facing Alzheimer's disease through our free 24/7 Helpline: [phone]. The Helpline provides reliable information and support to all those who need assistance, such as individuals living with memory loss, Alzheimer's or other dementia, caregivers, health care professionals and the public.  Our highly trained and knowledgeable staff can help you with: Understanding memory loss, dementia and Alzheimer's  Medications and other treatment options  General information about aging and brain health  Skills to provide quality care and to find the best care from professionals  Legal, financial and living-arrangement decisions Our Helpline also features: Confidential care consultation provided by master's level clinicians who can help with decision-making support, crisis assistance and education on issues families face every day  Help in a caller's preferred language using our translation service that features more than 200 languages and dialects  Referrals to local community programs, services and ongoing support     FALL PRECAUTIONS: Be cautious when walking. Scan the area for obstacles that may increase the risk of trips and falls. When getting up in the mornings, sit up at  the edge of the bed for a few minutes before getting out of bed. Consider elevating the bed at the head end to avoid drop of blood  pressure when getting up. Walk always in a well-lit room (use night lights in the walls). Avoid area rugs or power cords from appliances in the middle of the walkways. Use a walker or a cane if necessary and consider physical therapy for balance exercise. Get your eyesight checked regularly.  FINANCIAL OVERSIGHT: Supervision, especially oversight when making financial decisions or transactions is also recommended.  HOME SAFETY: Consider the safety of the kitchen when operating appliances like stoves, microwave oven, and blender. Consider having supervision and share cooking responsibilities until no longer able to participate in those. Accidents with firearms and other hazards in the house should be identified and addressed as well.   ABILITY TO BE LEFT ALONE: If patient is unable to contact 911 operator, consider using LifeLine, or when the need is there, arrange for someone to stay with patients. Smoking is a fire hazard, consider supervision or cessation. Risk of wandering should be assessed by caregiver and if detected at any point, supervision and safe proof recommendations should be instituted.  MEDICATION SUPERVISION: Inability to self-administer medication needs to be constantly addressed. Implement a mechanism to ensure safe administration of the medications.       Mediterranean Diet A Mediterranean diet refers to food and lifestyle choices that are based on the traditions of countries located on the [employer] [place]. This way of eating has been shown to help prevent certain conditions and improve outcomes for people who have chronic diseases, like kidney disease and heart disease. What are tips for following this plan? Lifestyle  Cook and eat meals together with your family, when possible. Drink enough fluid to keep your urine clear or pale yellow. Be physically active every day. This includes: Aerobic exercise like running or swimming. Leisure activities like gardening, walking, or  housework. Get 7-8 hours of sleep each night. If recommended by your health care provider, drink red wine in moderation. This means 1 glass a day for nonpregnant women and 2 glasses a day for men. A glass of wine equals 5 oz (150 mL). Reading food labels  Check the serving size of packaged foods. For foods such as rice and pasta, the serving size refers to the amount of cooked product, not dry. Check the total fat in packaged foods. Avoid foods that have saturated fat or trans fats. Check the ingredients list for added sugars, such as corn syrup. Shopping  At the grocery store, buy most of your food from the areas near the walls of the store. This includes: Fresh fruits and vegetables (produce). Grains, beans, nuts, and seeds. Some of these may be available in unpackaged forms or large amounts (in bulk). Fresh seafood. Poultry and eggs. Low-fat dairy products. Buy whole ingredients instead of prepackaged foods. Buy fresh fruits and vegetables in-season from local farmers markets. Buy frozen fruits and vegetables in resealable bags. If you do not have access to quality fresh seafood, buy precooked frozen shrimp or canned fish, such as tuna, salmon, or sardines. Buy small amounts of raw or cooked vegetables, salads, or olives from the deli or salad bar at your store. Stock your pantry so you always have certain foods on hand, such as olive oil, canned tuna, canned tomatoes, rice, pasta, and beans. Cooking  Cook foods with extra-virgin olive oil instead of using butter or other vegetable oils. Have meat as a  side dish, and have vegetables or grains as your main dish. This means having meat in small portions or adding small amounts of meat to foods like pasta or stew. Use beans or vegetables instead of meat in common dishes like chili or lasagna. Experiment with different cooking methods. Try roasting or broiling vegetables instead of steaming or sauteing them. Add frozen vegetables to soups,  stews, pasta, or rice. Add nuts or seeds for added healthy fat at each meal. You can add these to yogurt, salads, or vegetable dishes. Marinate fish or vegetables using olive oil, lemon juice, garlic, and fresh herbs. Meal planning  Plan to eat 1 vegetarian meal one day each week. Try to work up to 2 vegetarian meals, if possible. Eat seafood 2 or more times a week. Have healthy snacks readily available, such as: Vegetable sticks with hummus. Greek yogurt. Fruit and nut trail mix. Eat balanced meals throughout the week. This includes: Fruit: 2-3 servings a day Vegetables: 4-5 servings a day Low-fat dairy: 2 servings a day Fish, poultry, or lean meat: 1 serving a day Beans and legumes: 2 or more servings a week Nuts and seeds: 1-2 servings a day Whole grains: 6-8 servings a day Extra-virgin olive oil: 3-4 servings a day Limit red meat and sweets to only a few servings a month What are my food choices? Mediterranean diet Recommended Grains: Whole-grain pasta. Brown rice. Bulgar wheat. Polenta. Couscous. Whole-wheat bread. Orpah Cobb. Vegetables: Artichokes. Beets. Broccoli. Cabbage. Carrots. Eggplant. Green beans. Chard. Kale. Spinach. Onions. Leeks. Peas. Squash. Tomatoes. Peppers. Radishes. Fruits: Apples. Apricots. Avocado. Berries. Bananas. Cherries. Dates. Figs. Grapes. Lemons. Melon. Oranges. Peaches. Plums. Pomegranate. Meats and other protein foods: Beans. Almonds. Sunflower seeds. Pine nuts. Peanuts. Cod. Salmon. Scallops. Shrimp. Tuna. Tilapia. Clams. Oysters. Eggs. Dairy: Low-fat milk. Cheese. Greek yogurt. Beverages: Water. Red wine. Herbal tea. Fats and oils: Extra virgin olive oil. Avocado oil. Grape seed oil. Sweets and desserts: [place] yogurt with honey. Baked apples. Poached pears. Trail mix. Seasoning and other foods: Basil. Cilantro. Coriander. Cumin. Mint. Parsley. Sage. Rosemary. Tarragon. Garlic. Oregano. Thyme. Pepper. Balsalmic vinegar. Tahini. Hummus. Tomato  sauce. Olives. Mushrooms. Limit these Grains: Prepackaged pasta or rice dishes. Prepackaged cereal with added sugar. Vegetables: Deep fried potatoes (french fries). Fruits: Fruit canned in syrup. Meats and other protein foods: Beef. Pork. Lamb. Poultry with skin. Hot dogs. Tomasa Blase. Dairy: Ice cream. Sour cream. Whole milk. Beverages: Juice. Sugar-sweetened soft drinks. Beer. Liquor and spirits. Fats and oils: Butter. Canola oil. Vegetable oil. Beef fat (tallow). Lard. Sweets and desserts: Cookies. Cakes. Pies. Candy. Seasoning and other foods: Mayonnaise. Premade sauces and marinades. The items listed may not be a complete list. Talk with your dietitian about what dietary choices are right for you. Summary The Mediterranean diet includes both food and lifestyle choices. Eat a variety of fresh fruits and vegetables, beans, nuts, seeds, and whole grains. Limit the amount of red meat and sweets that you eat. Talk with your health care provider about whether it is safe for you to drink red wine in moderation. This means 1 glass a day for nonpregnant women and 2 glasses a day for men. A glass of wine equals 5 oz (150 mL). This information is not intended to replace advice given to you by your health care provider. Make sure you discuss any questions you have with your health care provider. Document Released: 03/13/2016 Document Revised: 04/15/2016 Document Reviewed: 03/13/2016 Elsevier Interactive Patient Education  2017 [employer].

## 2023-04-15 NOTE — Progress Notes (Signed)
Assessment/Plan:   Dementia likely due to Alzheimer's disease with behavioral disturbance  Holly Simmons is a very pleasant 82 y.o. 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  Patient is not on antidementia medication given her advanced dementia.  She is on Rexulti for agitation as per PCP.   Follow up in  6 months. Due to advanced disease, and type dementia medication is not indicated  Continue 24/7 monitoring for safety continue to control mood as per PCP, patient on Rexulti, bupropion. Recommend good control of her cardiovascular risk factors, continue Coumadin per cardiology    Subjective:    This patient is accompanied in the office by her daughter  who supplements the history.  Previous records as well as any outside records available were reviewed prior to todays visit. Patient was last seen on 10/06/2022 with a MoCA of 7/30.    Any changes in memory since last visit? ". She still recognizes family members, but STM is worse. She enjoys watching the [employer]. repeats oneself?  Endorsed such as "what time are you going to work?"-Daughter says Disoriented when walking into a room?  Patient denies    Leaving objects in unusual places?  May misplace things.  "I was accuse of hiding the sunglasses ".  If not in the regular spot it is lost.  Wandering behavior?  denies. Any personality changes since last visit?    In the morning she may be a little upset but then passes.  Sometimes she becomes tearful because she is scared about her memory loss Any worsening depression?:  Denies.   Hallucinations or paranoia?  Denies.   Seizures? denies    Any sleep changes? Sleeps well. Denies vivid dreams, REM behavior or  sleepwalking   Sleep apnea?   Denies.   Any hygiene concerns? Denies.  Independent of bathing and dressing?  Endorsed  Does the patient needs help with medications? Son  is in charge   Who is in charge of the finances? Son is in charge     Any changes in appetite?  Denies.  Lost a few teeth recently, dentist following.    Patient have trouble swallowing? Denies. Had ST with good results  Does the patient cook?  She prepares some food but only with supervision.  Any headaches?   Denies.   Chronic back pain  denies   Ambulates with difficulty? Denies.Uses the walker for stability     Recent falls or head injuries? Denies.     Unilateral weakness, numbness or tingling? denies   Any tremors?  Denies   Any anosmia?  Denies   Any incontinence of urine?  Endorsed wears diapers Any bowel dysfunction?  Intermittent diarrhea.      Patient lives  with daughter  Does the patient drive? No longer drives     Initial evaluation March 2024 How long did patient have memory difficulties?  For at least 6 years, worse over the last 6 months "where he took a drastic turn ".  She has difficulty recalling conversations, names.  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    repeats oneself?  Endorsed, at times asking several times "where is Holly Simmons?  (Son) ".  Sometimes she mixes her husband with her son. Disoriented when walking into a room?  Patient denies, however sometimes she may think she is at her home when she is at her son's house   Wandering behavior? denies   Any personality changes since last visit?  "Much more agitated and aggressive over the last 6 months, she was placed on Rexulti ".   Any worsening depression?:"possibly" Hallucinations or paranoia?  denies   Seizures?   denies    Any sleep changes?  " I fall asleep in the chair ", sleeps well at night.  Denies vivid dreams, REM behavior or sleepwalking   Sleep apnea? Endorsed, does not like to use the CPAP  Any hygiene  concerns?  denies   Independent of bathing and dressing?  Endorsed  Does the patient need help with medications?  Son and daughter are in charge.  Her son keeps the drug books and her daughter gets the tray and administers her meds . Who is in charge of the finances? Son in charge    Any changes in appetite?   denies .  They push water. Patient have trouble swallowing?  Endorsed, she chokes frequently over the last few months, take small bites.  She had an endoscopy recently, which was negative  Does the patient cook?  No Any headaches?  denies   Chronic back pain  denies   Ambulates with difficulty?  Some days she is more "wobbly "she uses the walker Recent falls or head injuries?  Denies, close calls      Unilateral weakness, numbness or tingling?  denies   Any tremors?  denies   Any anosmia?  denies   Any incontinence of urine? "Sometimes she has accidents, she does not know its too late and has an accident , wears depends " Any bowel dysfunction?  Occasional constipation  Patient lives   with daughter  History of heavy alcohol intake? denies   History of heavy tobacco use?   denies   Family history of dementia?   Endorsed Dose patient drive?  Not for the last  2 years     PREVIOUS MEDICATIONS:   CURRENT MEDICATIONS:  Outpatient Encounter Medications as of 04/15/2023  Medication Sig   allopurinol (ZYLOPRIM) 100 MG tablet TAKE ONE TABLET BY MOUTH DAILY   amoxicillin (AMOXIL) 500 MG capsule TAKE 4 CAPSULES 1 HOUR PRIOR TO PROCEDURE   Betamethasone Valerate 0.12 % foam Apply 1 application topically daily.   buPROPion (WELLBUTRIN XL) 300 MG 24 hr tablet Take 1 tablet (300 mg total) by mouth daily.   dronabinol (MARINOL) 5 MG capsule Take 1 capsule by mouth two times daily before lunch and supper.   famotidine (PEPCID) 40 MG tablet TAKE ONE TABLET BY MOUTH DAILY   ferrous sulfate 325 (65 FE) MG tablet Take 1 tablet (325 mg total) by mouth 2 (two) times daily with a meal. (Patient  taking differently: Take 325-650 mg by mouth daily with breakfast.)   HYDROcodone-acetaminophen (NORCO/VICODIN) 5-325 MG tablet Take 1 tablet by mouth every 6 (six) hours as needed.   hydrocortisone (ANUSOL-HC) 25 MG suppository Place 1 suppository (25 mg total) rectally 2 (two) times daily.   ipratropium (ATROVENT) 0.03 % nasal spray USE 2 SPRAYS IN EACH NOSTRIL 3 TIMES A DAY AS NEEDED. (Patient taking differently: Place 2 sprays into both nostrils daily as needed (nose bleed).)   lamoTRIgine (LAMICTAL) 25 MG tablet Take 1 tablet (25 mg total) by mouth daily.   linaclotide (LINZESS) 72 MCG capsule TAKE 1 CAPSULE EVERY DAY BEFORE BREAKFAST   metoprolol tartrate (LOPRESSOR) 25 MG  tablet TAKE ONE TABLET BY MOUTH TWICE DAILY   modafinil (PROVIGIL) 100 MG tablet Take 1 tablet (100 mg total) by mouth daily. (Patient taking differently: Take 100 mg by mouth daily as needed (sleep disorder).)   Pitavastatin Calcium 2 MG TABS TAKE ONE TABLET BY MOUTH DAILY   PRESCRIPTION MEDICATION 1 Dose by Other route at bedtime as needed (sleep). CPAP   Propylene Glycol (SYSTANE BALANCE OP) Place 1 drop into both eyes 4 (four) times daily as needed (dry eyes).   REXULTI 0.25 MG TABS tablet TAKE 1 TABLET BY MOUTH DAILY   SYMBICORT 80-4.5 MCG/ACT inhaler USE 2 PUFFS TWICE A DAY. (Patient taking differently: 2 puffs 2 (two) times daily.)   torsemide (DEMADEX) 20 MG tablet Take 1 tablet (20 mg total) by mouth daily.   Vilazodone HCl (VIIBRYD) 40 MG TABS TAKE ONE TABLET BY MOUTH DAILY   warfarin (COUMADIN) 1 MG tablet Take 1 tablet (1 mg total) by mouth daily. TAKE 1 1/2 TABLET EVERY WEDNESDAY OR AS DIRECTED BY ANTICOAGULATION [facility]   warfarin (COUMADIN) 2.5 MG tablet TAKE 1 TABLET BY MOUTH DAILY EXCEPT TAKE 1/2 TABLET ON WEDNESDAYS OR AS DIRECTED BY ANTICOAGULATION CLINIC   No facility-administered encounter medications on file as of 04/15/2023.       10/06/2022    4:35 PM 12/27/2014    5:55 PM  MMSE - Mini Mental State  Exam  Not completed: Unable to complete --      10/06/2022    6:00 PM  Montreal Cognitive Assessment   Visuospatial/ Executive (0/5) 2  Naming (0/3) 2  Attention: Read list of digits (0/2) 0  Attention: Read list of letters (0/1) 0  Attention: Serial 7 subtraction starting at 100 (0/3) 1  Language: Repeat phrase (0/2) 0  Language : Fluency (0/1) 0  Abstraction (0/2) 0  Delayed Recall (0/5) 0  Orientation (0/6) 1  Total 6  Adjusted Score (based on education) 7    Objective:     PHYSICAL EXAMINATION:    VITALS:   Vitals:   04/15/23 1440  BP: 117/61  Pulse: 89  Resp: 20  SpO2: 97%  Weight: 170 lb (77.1 kg)    GEN:  The patient appears stated age and is in NAD. HEENT:  Normocephalic, atraumatic.   Neurological examination:  General: NAD, well-groomed, appears stated age. Orientation: The patient is alert. Oriented to person, not to place and date Cranial nerves: There is good facial symmetry.The speech is fluent and clear. No aphasia or dysarthria. Fund of knowledge is reduced. Recent and remote memory are impaired. Attention and concentration are reduced.  Able to name objects and unable to repeat phrases.  Hearing is intact to conversational tone.   Sensation: Sensation is intact to light touch throughout Motor: Strength is at least antigravity x4. DTR's 2/4 in UE/LE     Movement examination: Tone: There is normal tone in the UE/LE Abnormal movements:  no tremor.  No myoclonus.  No asterixis.   Coordination:  There is decremation with RAM's.  Abnormal finger to nose  Gait and Station: The patient has no difficulty arising out of a deep-seated chair without the use of the hands. The patient's stride length is good.  Gait is cautious and narrow.    Thank you for allowing [place] the opportunity to participate in the care of this nice patient. Please do not hesitate to contact us for any questions or concerns.   Total time spent on today's visit was 30 minutes dedicated  to this patient today, preparing to see patient, examining the patient, ordering tests and/or medications and counseling the patient, documenting clinical information in the EHR or other health record, independently interpreting results and communicating results to the patient/family, discussing treatment and goals, answering patient's questions and coordinating care.  Cc:  Etta Grandchild, MD  Marlowe Kays 04/15/2023 7:57 PM

## 2023-04-17 ENCOUNTER — Telehealth: Payer: Self-pay

## 2023-04-17 ENCOUNTER — Ambulatory Visit (INDEPENDENT_AMBULATORY_CARE_PROVIDER_SITE_OTHER): Payer: Medicare PPO

## 2023-04-17 DIAGNOSIS — Z7901 Long term (current) use of anticoagulants: Secondary | ICD-10-CM

## 2023-04-17 LAB — POCT INR: INR: 2.5 (ref 2.0–3.0)

## 2023-04-17 NOTE — Patient Instructions (Addendum)
Pre visit review using our clinic review tool, if applicable. No additional management support is needed unless otherwise documented below in the visit note.  Continue 1 tablet daily except take 1/2 tablet on Mondays.  Re-check in 6  weeks.

## 2023-04-17 NOTE — Telephone Encounter (Signed)
Pt in coumadin clinic today and pt's son, Benita Gutter, brought up pt will be due for Prolia soon. Advised pt due after 10/16, since her last injection was at a coumadin clinic apt on 4/16. Advised a msg would be sent. Pt would like injection at coumadin clinic apt on 10/25 if insurance has been authorized.

## 2023-04-17 NOTE — Progress Notes (Signed)
Pt had emergency tooth extraction due to a break one week ago. Dentist placed stitches and stitches were removed yesterday. No bleeding.   Continue 1 tablet daily except take 1/2 tablet on Mondays. Recheck in 6 weeks.

## 2023-04-17 NOTE — Telephone Encounter (Signed)
Pt was in coumadin clinic today with her son, Benita Gutter. He reported pt had broken another tooth about 1 week ago and had to have an extraction. Stitched removed today with no bleeding.  Benita Gutter is requesting a script for amoxicillin for future use if needed for dental procedures for pt. He says he has a few left from when Dr .Eden Emms sent it in last but wants to make sure he has it on hand if any further dental emergencies.   He is requesting 30 tablet be sent because they charge the same for 30 tablets or 4 tablets. He would like it sent to [facility] if agreeable to send script.   Advised Benita Gutter a msg would be sent to PCP with the request. Benita Gutter verbalized understanding.

## 2023-04-18 ENCOUNTER — Other Ambulatory Visit: Payer: Self-pay | Admitting: Internal Medicine

## 2023-04-18 DIAGNOSIS — Z5181 Encounter for therapeutic drug level monitoring: Secondary | ICD-10-CM

## 2023-04-18 DIAGNOSIS — Q210 Ventricular septal defect: Secondary | ICD-10-CM

## 2023-04-18 MED ORDER — AMOXICILLIN 500 MG PO CAPS
2000.0000 mg | ORAL_CAPSULE | Freq: Once | ORAL | 1 refills | Status: AC
Start: 2023-04-18 — End: 2023-04-18

## 2023-04-20 ENCOUNTER — Other Ambulatory Visit (HOSPITAL_COMMUNITY): Payer: Self-pay

## 2023-04-20 NOTE — Telephone Encounter (Signed)
Pt ready for scheduling for Prolia on or after : 05/20/23  Out-of-pocket cost due at time of visit: $40  Primary: [employer] - [employer] Prolia co-insurance: $40 Admin fee co-insurance: 0%  Secondary: N/A Prolia co-insurance:  Admin fee co-insurance:   Medical Benefit Details: Date Benefits were checked: 04/20/23 Deductible: no/ Coinsurance: $40/ Admin Fee: 0%  Prior Auth: approved PA# [id number] Expiration Date: 10/26/19 through 08/04/23  # of doses approved:  Pharmacy benefit: Copay $20 If patient wants fill through the pharmacy benefit please send prescription to:  [facility] , and include estimated need by date in rx notes. Pharmacy will ship medication directly to the office.  Patient not eligible for Prolia Copay Card. Copay Card can make patient's cost as little as $25. Link to apply: [email or web address]://www.amgensupportplus.[email or web address]/copay  ** This summary of benefits is an estimation of the patient's out-of-pocket cost. Exact cost may very based on individual plan coverage.

## 2023-04-20 NOTE — Progress Notes (Signed)
Remote pacemaker transmission.   

## 2023-04-20 NOTE — Telephone Encounter (Signed)
Prolia VOB initiated via [email or web address]  Next Prolia inj DUE: 05/24/23

## 2023-05-01 ENCOUNTER — Other Ambulatory Visit: Payer: Self-pay | Admitting: Internal Medicine

## 2023-05-01 ENCOUNTER — Encounter: Payer: Self-pay | Admitting: Internal Medicine

## 2023-05-01 DIAGNOSIS — F418 Other specified anxiety disorders: Secondary | ICD-10-CM

## 2023-05-01 DIAGNOSIS — F3341 Major depressive disorder, recurrent, in partial remission: Secondary | ICD-10-CM

## 2023-05-15 ENCOUNTER — Other Ambulatory Visit: Payer: Self-pay

## 2023-05-15 ENCOUNTER — Encounter (HOSPITAL_COMMUNITY): Payer: Self-pay | Admitting: Emergency Medicine

## 2023-05-15 ENCOUNTER — Observation Stay (HOSPITAL_COMMUNITY)
Admission: EM | Admit: 2023-05-15 | Discharge: 2023-05-18 | Disposition: A | Discharge status: Home / Self Care | Payer: Medicare PPO | Attending: Internal Medicine | Admitting: Internal Medicine

## 2023-05-15 ENCOUNTER — Emergency Department (HOSPITAL_COMMUNITY): Payer: Medicare PPO

## 2023-05-15 ENCOUNTER — Ambulatory Visit
Admission: EM | Admit: 2023-05-15 | Discharge: 2023-05-15 | Disposition: A | Discharge status: Home / Self Care | Payer: Medicare PPO | Attending: Internal Medicine | Admitting: Internal Medicine

## 2023-05-15 DIAGNOSIS — I480 Paroxysmal atrial fibrillation: Secondary | ICD-10-CM | POA: Diagnosis not present

## 2023-05-15 DIAGNOSIS — E1165 Type 2 diabetes mellitus with hyperglycemia: Secondary | ICD-10-CM | POA: Diagnosis not present

## 2023-05-15 DIAGNOSIS — R410 Disorientation, unspecified: Secondary | ICD-10-CM

## 2023-05-15 DIAGNOSIS — N179 Acute kidney failure, unspecified: Secondary | ICD-10-CM | POA: Insufficient documentation

## 2023-05-15 DIAGNOSIS — R739 Hyperglycemia, unspecified: Secondary | ICD-10-CM

## 2023-05-15 DIAGNOSIS — N1832 Chronic kidney disease, stage 3b: Secondary | ICD-10-CM | POA: Insufficient documentation

## 2023-05-15 DIAGNOSIS — R059 Cough, unspecified: Secondary | ICD-10-CM | POA: Diagnosis not present

## 2023-05-15 DIAGNOSIS — G9340 Encephalopathy, unspecified: Principal | ICD-10-CM

## 2023-05-15 DIAGNOSIS — I10 Essential (primary) hypertension: Secondary | ICD-10-CM | POA: Diagnosis not present

## 2023-05-15 DIAGNOSIS — I4891 Unspecified atrial fibrillation: Secondary | ICD-10-CM | POA: Insufficient documentation

## 2023-05-15 DIAGNOSIS — R29818 Other symptoms and signs involving the nervous system: Secondary | ICD-10-CM | POA: Diagnosis not present

## 2023-05-15 DIAGNOSIS — Z950 Presence of cardiac pacemaker: Secondary | ICD-10-CM | POA: Insufficient documentation

## 2023-05-15 DIAGNOSIS — I5032 Chronic diastolic (congestive) heart failure: Secondary | ICD-10-CM | POA: Diagnosis not present

## 2023-05-15 DIAGNOSIS — M6281 Muscle weakness (generalized): Secondary | ICD-10-CM | POA: Diagnosis not present

## 2023-05-15 DIAGNOSIS — Z1152 Encounter for screening for COVID-19: Secondary | ICD-10-CM | POA: Diagnosis not present

## 2023-05-15 DIAGNOSIS — Z952 Presence of prosthetic heart valve: Secondary | ICD-10-CM | POA: Insufficient documentation

## 2023-05-15 DIAGNOSIS — R0989 Other specified symptoms and signs involving the circulatory and respiratory systems: Secondary | ICD-10-CM | POA: Diagnosis not present

## 2023-05-15 DIAGNOSIS — R4182 Altered mental status, unspecified: Secondary | ICD-10-CM | POA: Diagnosis not present

## 2023-05-15 DIAGNOSIS — H1131 Conjunctival hemorrhage, right eye: Secondary | ICD-10-CM | POA: Insufficient documentation

## 2023-05-15 DIAGNOSIS — N189 Chronic kidney disease, unspecified: Secondary | ICD-10-CM | POA: Diagnosis not present

## 2023-05-15 DIAGNOSIS — R400 Somnolence: Secondary | ICD-10-CM | POA: Diagnosis not present

## 2023-05-15 DIAGNOSIS — E1122 Type 2 diabetes mellitus with diabetic chronic kidney disease: Secondary | ICD-10-CM | POA: Insufficient documentation

## 2023-05-15 DIAGNOSIS — J449 Chronic obstructive pulmonary disease, unspecified: Secondary | ICD-10-CM

## 2023-05-15 DIAGNOSIS — F0390 Unspecified dementia without behavioral disturbance: Secondary | ICD-10-CM | POA: Diagnosis not present

## 2023-05-15 DIAGNOSIS — I700 Atherosclerosis of aorta: Secondary | ICD-10-CM | POA: Diagnosis not present

## 2023-05-15 DIAGNOSIS — G9341 Metabolic encephalopathy: Secondary | ICD-10-CM | POA: Insufficient documentation

## 2023-05-15 DIAGNOSIS — I130 Hypertensive heart and chronic kidney disease with heart failure and stage 1 through stage 4 chronic kidney disease, or unspecified chronic kidney disease: Secondary | ICD-10-CM | POA: Insufficient documentation

## 2023-05-15 DIAGNOSIS — R531 Weakness: Secondary | ICD-10-CM

## 2023-05-15 DIAGNOSIS — R2689 Other abnormalities of gait and mobility: Secondary | ICD-10-CM | POA: Diagnosis not present

## 2023-05-15 DIAGNOSIS — Z7901 Long term (current) use of anticoagulants: Secondary | ICD-10-CM | POA: Diagnosis not present

## 2023-05-15 DIAGNOSIS — Z79899 Other long term (current) drug therapy: Secondary | ICD-10-CM | POA: Insufficient documentation

## 2023-05-15 LAB — DIFFERENTIAL
Abs Immature Granulocytes: 0.07 10*3/uL (ref 0.00–0.07)
Basophils Absolute: 0 10*3/uL (ref 0.0–0.1)
Basophils Relative: 0 %
Eosinophils Absolute: 0.1 10*3/uL (ref 0.0–0.5)
Eosinophils Relative: 1 %
Immature Granulocytes: 1 %
Lymphocytes Relative: 4 %
Lymphs Abs: 0.4 10*3/uL — ABNORMAL LOW (ref 0.7–4.0)
Monocytes Absolute: 1.2 10*3/uL — ABNORMAL HIGH (ref 0.1–1.0)
Monocytes Relative: 9 %
Neutro Abs: 10.5 10*3/uL — ABNORMAL HIGH (ref 1.7–7.7)
Neutrophils Relative %: 85 %

## 2023-05-15 LAB — URINALYSIS, W/ REFLEX TO CULTURE (INFECTION SUSPECTED)
Bilirubin Urine: NEGATIVE
Glucose, UA: 50 mg/dL — AB
Hgb urine dipstick: NEGATIVE
Ketones, ur: NEGATIVE mg/dL
Leukocytes,Ua: NEGATIVE
Nitrite: NEGATIVE
Protein, ur: 30 mg/dL — AB
Specific Gravity, Urine: 1.009 (ref 1.005–1.030)
pH: 5 (ref 5.0–8.0)

## 2023-05-15 LAB — I-STAT CHEM 8, ED
BUN: 51 mg/dL — ABNORMAL HIGH (ref 8–23)
Calcium, Ion: 1.14 mmol/L — ABNORMAL LOW (ref 1.15–1.40)
Chloride: 95 mmol/L — ABNORMAL LOW (ref 98–111)
Creatinine, Ser: 2.6 mg/dL — ABNORMAL HIGH (ref 0.44–1.00)
Glucose, Bld: 264 mg/dL — ABNORMAL HIGH (ref 70–99)
HCT: 38 % (ref 36.0–46.0)
Hemoglobin: 12.9 g/dL (ref 12.0–15.0)
Potassium: 5.7 mmol/L — ABNORMAL HIGH (ref 3.5–5.1)
Sodium: 135 mmol/L (ref 135–145)
TCO2: 31 mmol/L (ref 22–32)

## 2023-05-15 LAB — COMPREHENSIVE METABOLIC PANEL
ALT: 14 U/L (ref 0–44)
AST: 21 U/L (ref 15–41)
Albumin: 3.8 g/dL (ref 3.5–5.0)
Alkaline Phosphatase: 176 U/L — ABNORMAL HIGH (ref 38–126)
Anion gap: 14 (ref 5–15)
BUN: 32 mg/dL — ABNORMAL HIGH (ref 8–23)
CO2: 30 mmol/L (ref 22–32)
Calcium: 10.9 mg/dL — ABNORMAL HIGH (ref 8.9–10.3)
Chloride: 94 mmol/L — ABNORMAL LOW (ref 98–111)
Creatinine, Ser: 2.47 mg/dL — ABNORMAL HIGH (ref 0.44–1.00)
GFR, Estimated: 19 mL/min — ABNORMAL LOW (ref >60)
Glucose, Bld: 239 mg/dL — ABNORMAL HIGH (ref 70–99)
Potassium: 4 mmol/L (ref 3.5–5.1)
Sodium: 138 mmol/L (ref 135–145)
Total Bilirubin: 1.5 mg/dL — ABNORMAL HIGH (ref 0.3–1.2)
Total Protein: 7.3 g/dL (ref 6.5–8.1)

## 2023-05-15 LAB — PROTIME-INR
INR: 3.1 — ABNORMAL HIGH (ref 0.8–1.2)
Prothrombin Time: 32.2 s — ABNORMAL HIGH (ref 11.4–15.2)

## 2023-05-15 LAB — APTT: aPTT: 44 s — ABNORMAL HIGH (ref 24–36)

## 2023-05-15 LAB — CBG MONITORING, ED
Glucose-Capillary: 255 mg/dL — ABNORMAL HIGH (ref 70–99)
Glucose-Capillary: 152 mg/dL — ABNORMAL HIGH (ref 70–99)

## 2023-05-15 LAB — POCT FASTING CBG KUC MANUAL ENTRY: POCT Glucose (KUC): 239 mg/dL — AB (ref 70–99)

## 2023-05-15 LAB — CBC
HCT: 35.8 % — ABNORMAL LOW (ref 36.0–46.0)
Hemoglobin: 11.2 g/dL — ABNORMAL LOW (ref 12.0–15.0)
MCH: 30.2 pg (ref 26.0–34.0)
MCHC: 31.3 g/dL (ref 30.0–36.0)
MCV: 96.5 fL (ref 80.0–100.0)
Platelets: 173 10*3/uL (ref 150–400)
RBC: 3.71 MIL/uL — ABNORMAL LOW (ref 3.87–5.11)
RDW: 14.6 % (ref 11.5–15.5)
WBC: 12.3 10*3/uL — ABNORMAL HIGH (ref 4.0–10.5)
nRBC: 0 % (ref 0.0–0.2)

## 2023-05-15 LAB — SARS CORONAVIRUS 2 BY RT PCR: SARS Coronavirus 2 by RT PCR: NEGATIVE

## 2023-05-15 LAB — ETHANOL: Alcohol, Ethyl (B): <10 mg/dL (ref <10)

## 2023-05-15 MED ORDER — ACETAMINOPHEN 325 MG PO TABS: 650.0000 mg | ORAL_TABLET | Freq: Four times a day (QID) | ORAL | Status: DC | PRN

## 2023-05-15 MED ORDER — INSULIN ASPART 100 UNIT/ML IJ SOLN
0.0000 [IU] | Freq: Three times a day (TID) | INTRAMUSCULAR | Status: DC
  Administered 2023-05-17: 1 [IU] via SUBCUTANEOUS

## 2023-05-15 MED ORDER — ACETAMINOPHEN 650 MG RE SUPP: 650.0000 mg | Freq: Four times a day (QID) | RECTAL | Status: DC | PRN

## 2023-05-15 MED ORDER — INSULIN ASPART 100 UNIT/ML IJ SOLN: 0.0000 [IU] | Freq: Every day | INTRAMUSCULAR | Status: DC

## 2023-05-15 MED ORDER — SODIUM CHLORIDE 0.9% FLUSH
3.0000 mL | Freq: Once | INTRAVENOUS | Status: AC
  Administered 2023-05-15: 3 mL via INTRAVENOUS

## 2023-05-15 MED ORDER — SODIUM CHLORIDE 0.9 % IV BOLUS
1000.0000 mL | Freq: Once | INTRAVENOUS | Status: AC
  Administered 2023-05-15: 1000 mL via INTRAVENOUS

## 2023-05-15 NOTE — ED Notes (Signed)
Patient is being discharged from the Urgent Care and sent to the Emergency Department via EMS . Per [place], [place], patient is in need of higher level of care due to stroke like symptoms. Patient is aware and verbalizes understanding of plan of care.  Vitals:   05/15/23 1705 05/15/23 1707  BP: (!) 173/76 (!) 163/73  Pulse:    Resp:    Temp:    SpO2:

## 2023-05-15 NOTE — ED Triage Notes (Signed)
Per son, pt started dragging the left leg, no able to grasp with left and and muscle weakness in left arm fro the past hr. Per son, last night around 2000 pt was normal. Pt reports years ago, pt was admitted to the hospital due to severe dehydration that mimic stoke like symptoms.

## 2023-05-15 NOTE — ED Notes (Signed)
Sterile technique used with in and out cath with Ramon Dredge. Paramedic.

## 2023-05-15 NOTE — H&P (Signed)
History and Physical    Holly Simmons NFA:[id number] DOB: 01-05-1941 DOA: 05/15/2023  PCP: Etta Grandchild, MD  Patient coming from: Home  Chief Complaint: Left-sided weakness  HPI: Holly Simmons is a 82 y.o. female with medical history significant of CKD stage IIIb, chronic HFpEF, hypertrophic cardiomyopathy status post myomectomy in 2012, CHB status post PPM, bioprosthetic TV, atrial fibrillation on Coumadin, hyperlipidemia, dementia, COPD presented to the ED as code stroke from urgent care for evaluation of confusion and acute onset left-sided weakness.  Last known well at 1455 today.  CT head negative for acute abnormality.  Seen by neurology and TNKase not given as symptoms had resolved and chronically anticoagulated with Coumadin.  Neurology canceled code stroke and felt that her presentation was more consistent with toxic metabolic encephalopathy and fatigue in the setting of dementia.  Recommended workup for causes of toxic metabolic encephalopathy.  Neurology has signed off.  Family also reported patient having a cough.  Afebrile.  Labs showing WBC 12.3, hemoglobin 11.2 (at baseline), glucose 239, bicarb 30, BUN 32, creatinine 2.4 (baseline 1.7-1.8), calcium 10.9, albumin 3.8, alk phos 176, T. bili 1.5, AST and ALT normal, INR 3.1, blood ethanol level undetectable, SARS-CoV PCR negative, UA not suggestive of infection.  Chest x-ray showing low lung volumes and no active disease. Patient was given 1 L normal saline.  TRH called to admit.  Patient is oriented to person and place but otherwise very confused and not able to give any history.  Review of Systems:  Review of Systems  Reason unable to perform ROS: AMS.    Past Medical History:  Diagnosis Date   Allergic rhinitis    Asthma    NOS w/ acute exacerbation   Blood transfusion without reported diagnosis    CKD (chronic kidney disease) stage 3, GFR 30-59 ml/min (HCC) 09/12/2018   Colon polyps    TUBULAR ADENOMAS AND  HYPERPLASTIC   Complete heart block (HCC)    requiring PPM (MDT) post surgical myomectomy at [facility],  leads are epicardial with abdominal implant, high ventricular threshold at implant   COPD (chronic obstructive pulmonary disease) (HCC)    Depressive disorder    Diastolic dysfunction    DM (diabetes mellitus) (HCC)    Gallstones    GERD (gastroesophageal reflux disease)    Gout 08/20/2012   Heart murmur    Hyperpotassemia    Hypersomnia    Hypertension    Hypertrophic cardiomyopathy (HCC)    s/p surgical myomectomy at [facility] 11/12 complicated by septal VSD post procedure requiring reoperation with patch closure and tricuspid valve replacement   Kidney stones    Myocardial infarction (HCC) 2011   Obstructive sleep apnea    persistent daytime sleepiness despite cpap   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   Psoriasis    Pyuria    Renal insufficiency    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   Typical atrial flutter (HCC) 9/15    Past Surgical History:  Procedure Laterality Date   ABDOMINAL HYSTERECTOMY     APPENDECTOMY     BREAST CYST EXCISION     CARDIOVERSION N/A 08/01/2014   Procedure: CARDIOVERSION;  Surgeon: Vesta Mixer, MD;  Location: [facility] ENDOSCOPY;  Service: Cardiovascular;  Laterality: N/A;   CESAREAN SECTION     CHOLECYSTECTOMY     COLONOSCOPY     EYE SURGERY  01/16/2016   left eye lens implant   EYE SURGERY  01/09/2016   right eye lens implant  INSERT / REPLACE / REMOVE PACEMAKER  07/13/2012   PACEMAKER INSERTION  07/02/2011   epicardial wires with abdominal implant at [facility] 12/12,  high ventricular lead threshold at implant per Dr Christin Fudge   PERMANENT PACEMAKER INSERTION N/A 07/13/2012   Procedure: PERMANENT PACEMAKER INSERTION;  Surgeon: Hillis Range, MD;  Location: [facility] CATH LAB;  Service: Cardiovascular;  Laterality: N/A;   POLYPECTOMY     septal myomectomy for hypertrophic CM  06/23/2011   by Dr Silvestre Mesi at [facility], complicated by septal VSD requiring patch repair and  tricuspid valve replacement   TONSILLECTOMY     TOOTH EXTRACTION     TRICUSPID VALVE REPLACEMENT  06/2011   Medtronic 27mm Mosaic tissue valve   VSD REPAIR  06/2011     reports that she has never smoked. She has never used smokeless tobacco. She reports that she does not drink alcohol and does not use drugs.  Allergies  Allergen Reactions   Petrolatum-Zinc Oxide Anaphylaxis, Rash and Swelling   Sulfa Antibiotics Rash   Sulfonamide Derivatives Anaphylaxis and Swelling   Tetanus Toxoid     Other reaction(s): Other (See Comments) OTHER REACTION   Tetanus Toxoids Swelling   Other Rash and Other (See Comments)    STERI - STRIPS  It can affect proteins in her kidneys so she doesn't take REACTION: proteinuria Other reaction(s): Other (See Comments), Unknown It can affect proteins in her kidneys so she doesn't take   Nsaids Other (See Comments)    REACTION: proteinuria Other reaction(s): Other (See Comments), Unknown It can affect proteins in her kidneys so she doesn't take    Tetanus Toxoid, Adsorbed Other (See Comments)    Other reaction(s): Other (See Comments) Turned arm red Turned arm red    Family History  Problem Relation Age of Onset   Hypertension Daughter    Heart disease Maternal Grandfather    Heart disease Paternal Grandfather    Bladder Cancer Father    Prostate cancer Father    Cancer Father    Varicose Veins Father    Heart attack Father    Breast cancer Mother    Dementia Mother    Hypertension Mother    Cancer Mother    Ovarian cancer Maternal Aunt    Pancreatic cancer Cousin    Breast cancer Paternal Aunt    Dementia Maternal Aunt        x 2   Dementia Maternal Uncle        x 2   Colon cancer Neg Hx    Rectal cancer Neg Hx    Stomach cancer Neg Hx    Esophageal cancer Neg Hx     Prior to Admission medications   Medication Sig Start Date End Date Taking? Authorizing Provider  allopurinol (ZYLOPRIM) 100 MG tablet TAKE ONE TABLET BY MOUTH DAILY  12/07/22   Etta Grandchild, MD  Betamethasone Valerate 0.12 % foam Apply 1 application topically daily. 04/02/23   Etta Grandchild, MD  buPROPion (WELLBUTRIN XL) 300 MG 24 hr tablet Take 1 tablet (300 mg total) by mouth daily. 05/01/23   Etta Grandchild, MD  dronabinol (MARINOL) 5 MG capsule Take 1 capsule by mouth two times daily before lunch and supper. 12/08/22   Etta Grandchild, MD  famotidine (PEPCID) 40 MG tablet TAKE ONE TABLET BY MOUTH DAILY 12/07/22   Etta Grandchild, MD  ferrous sulfate 325 (65 FE) MG tablet Take 1 tablet (325 mg total) by mouth 2 (two) times daily with a  meal. Patient taking differently: Take 325-650 mg by mouth daily with breakfast. 11/10/20   Etta Grandchild, MD  HYDROcodone-acetaminophen (NORCO/VICODIN) 5-325 MG tablet Take 1 tablet by mouth every 6 (six) hours as needed. 12/24/22   Clark, Meghan R, PA-C  hydrocortisone (ANUSOL-HC) 25 MG suppository Place 1 suppository (25 mg total) rectally 2 (two) times daily. 10/23/22   Wallis Bamberg, PA-C  ipratropium (ATROVENT) 0.03 % nasal spray USE 2 SPRAYS IN EACH NOSTRIL 3 TIMES A DAY AS NEEDED. Patient taking differently: Place 2 sprays into both nostrils daily as needed (nose bleed). 04/29/18   Coralyn Helling, MD  lamoTRIgine (LAMICTAL) 25 MG tablet Take 1 tablet (25 mg total) by mouth daily. 05/01/23   Etta Grandchild, MD  linaclotide [facility]) 72 MCG capsule TAKE 1 CAPSULE EVERY DAY BEFORE BREAKFAST 03/23/23   Etta Grandchild, MD  metoprolol tartrate (LOPRESSOR) 25 MG tablet TAKE ONE TABLET BY MOUTH TWICE DAILY 04/02/23   Etta Grandchild, MD  modafinil (PROVIGIL) 100 MG tablet Take 1 tablet (100 mg total) by mouth daily. Patient taking differently: Take 100 mg by mouth daily as needed (sleep disorder). 03/29/18   Coralyn Helling, MD  Pitavastatin Calcium 2 MG TABS TAKE ONE TABLET BY MOUTH DAILY 04/02/23   Etta Grandchild, MD  PRESCRIPTION MEDICATION 1 Dose by Other route at bedtime as needed (sleep). CPAP    [provider]  Propylene  Glycol (SYSTANE BALANCE OP) Place 1 drop into both eyes 4 (four) times daily as needed (dry eyes).    [provider]  REXULTI 0.25 MG TABS tablet TAKE 1 TABLET BY MOUTH DAILY 03/31/23   Etta Grandchild, MD  SYMBICORT 80-4.5 MCG/ACT inhaler USE 2 PUFFS TWICE A DAY. Patient taking differently: 2 puffs 2 (two) times daily. 10/11/20   Etta Grandchild, MD  torsemide (DEMADEX) 20 MG tablet Take 1 tablet (20 mg total) by mouth daily. 04/02/23   Etta Grandchild, MD  Vilazodone HCl (VIIBRYD) 40 MG TABS TAKE ONE TABLET BY MOUTH DAILY 12/07/22   Etta Grandchild, MD  warfarin (COUMADIN) 1 MG tablet Take 1 tablet (1 mg total) by mouth daily. TAKE 1 1/2 TABLET EVERY WEDNESDAY OR AS DIRECTED BY ANTICOAGULATION LCINIC 02/26/22   Etta Grandchild, MD  warfarin (COUMADIN) 2.5 MG tablet TAKE 1 TABLET BY MOUTH DAILY EXCEPT TAKE 1/2 TABLET ON WEDNESDAYS OR AS DIRECTED BY ANTICOAGULATION CLINIC 08/08/22   Etta Grandchild, MD    Physical Exam: Vitals:   05/15/23 2000 05/15/23 2030 05/15/23 2130 05/15/23 2200  BP: (!) 150/63 (!) 145/59 129/63 (!) 130/55  Pulse: 68 60 60 61  Resp: 18 (!) 21 20 (!) 22  Temp:      TempSrc:      SpO2: 95% 94% 93% 93%  Weight:      Height:        Physical Exam Vitals reviewed.  Constitutional:      General: She is not in acute distress. HENT:     Head: Normocephalic and atraumatic.  Eyes:     Extraocular Movements: Extraocular movements intact.  Cardiovascular:     Rate and Rhythm: Normal rate and regular rhythm.     Pulses: Normal pulses.  Pulmonary:     Effort: Pulmonary effort is normal. No respiratory distress.     Breath sounds: Normal breath sounds. No wheezing or rales.  Abdominal:     General: Bowel sounds are normal.     Palpations: Abdomen is soft.  Tenderness: There is no abdominal tenderness. There is no guarding.  Musculoskeletal:     Cervical back: Normal range of motion.     Right lower leg: No edema.     Left lower leg: No edema.  Skin:     General: Skin is warm and dry.  Neurological:     General: No focal deficit present.     Mental Status: She is alert.     Comments: Oriented to person and place only Very confused     Labs on Admission: I have personally reviewed following labs and imaging studies  CBC: Recent Labs  Lab 05/15/23 1742 05/15/23 1748  WBC 12.3*  --   NEUTROABS 10.5*  --   HGB 11.2* 12.9  HCT 35.8* 38.0  MCV 96.5  --   PLT 173  --    Basic Metabolic Panel: Recent Labs  Lab 05/15/23 1748 05/15/23 1828  NA 135 138  K 5.7* 4.0  CL 95* 94*  CO2  --  30  GLUCOSE 264* 239*  BUN 51* 32*  CREATININE 2.60* 2.47*  CALCIUM  --  10.9*   GFR: Estimated Creatinine Clearance: 18.2 mL/min (A) (by C-G formula based on SCr of 2.47 mg/dL (H)). Liver Function Tests: Recent Labs  Lab 05/15/23 1828  AST 21  ALT 14  ALKPHOS 176*  BILITOT 1.5*  PROT 7.3  ALBUMIN 3.8   No results for input(s): "LIPASE", "AMYLASE" in the last 168 hours. No results for input(s): "AMMONIA" in the last 168 hours. Coagulation Profile: Recent Labs  Lab 05/15/23 1742  INR 3.1*   Cardiac Enzymes: No results for input(s): "CKTOTAL", "CKMB", "CKMBINDEX", "TROPONINI" in the last 168 hours. BNP (last 3 results) Recent Labs    07/01/22 1503  PROBNP 431.0*   HbA1C: No results for input(s): "HGBA1C" in the last 72 hours. CBG: Recent Labs  Lab 05/15/23 1742 05/15/23 2046  GLUCAP 255* 152*   Lipid Profile: No results for input(s): "CHOL", "HDL", "LDLCALC", "TRIG", "CHOLHDL", "LDLDIRECT" in the last 72 hours. Thyroid Function Tests: No results for input(s): "TSH", "T4TOTAL", "FREET4", "T3FREE", "THYROIDAB" in the last 72 hours. Anemia Panel: No results for input(s): "VITAMINB12", "FOLATE", "FERRITIN", "TIBC", "IRON", "RETICCTPCT" in the last 72 hours. Urine analysis:    Component Value Date/Time   COLORURINE YELLOW 05/15/2023 1935   APPEARANCEUR CLEAR 05/15/2023 1935   LABSPEC 1.009 05/15/2023 1935   PHURINE  5.0 05/15/2023 1935   GLUCOSEU 50 (A) 05/15/2023 1935   GLUCOSEU NEGATIVE 07/01/2022 1503   HGBUR NEGATIVE 05/15/2023 1935   BILIRUBINUR NEGATIVE 05/15/2023 1935   BILIRUBINUR neg 05/14/2012 1053   KETONESUR NEGATIVE 05/15/2023 1935   PROTEINUR 30 (A) 05/15/2023 1935   UROBILINOGEN 1.0 07/01/2022 1503   NITRITE NEGATIVE 05/15/2023 1935   LEUKOCYTESUR NEGATIVE 05/15/2023 1935    Radiological Exams on Admission: DG Chest Portable 1 View  Result Date: 05/15/2023 CLINICAL DATA:  Cough EXAM: PORTABLE CHEST 1 VIEW COMPARISON:  Radiograph 07/01/2022 FINDINGS: Left chest wall CRT-P. Sternotomy and CABG. Epicardial pacing wires. Stable cardiomediastinal silhouette. Aortic atherosclerotic calcification. Low lung volumes accentuate pulmonary vascularity. No pleural effusion or pneumothorax. IMPRESSION: Low lung volumes.  No active disease. Electronically Signed   By: Minerva Fester M.D.   On: 05/15/2023 21:57   CT HEAD CODE STROKE WO CONTRAST  Result Date: 05/15/2023 CLINICAL DATA:  Code stroke. Neuro deficit, acute, stroke suspected. Left-sided weakness. EXAM: CT HEAD WITHOUT CONTRAST TECHNIQUE: Contiguous axial images were obtained from the base of the skull through the vertex  without intravenous contrast. RADIATION DOSE REDUCTION: This exam was performed according to the departmental dose-optimization program which includes automated exposure control, adjustment of the mA and/or kV according to patient size and/or use of iterative reconstruction technique. COMPARISON:  Head CT 03/19/2022. FINDINGS: Brain: No acute hemorrhage. Unchanged moderate chronic small-vessel disease. Cortical gray-white differentiation is otherwise preserved. Prominence of the ventricles and sulci within expected range for age. No hydrocephalus or extra-axial collection. No mass effect or midline shift. Vascular: No hyperdense vessel or unexpected calcification. Skull: No calvarial fracture or suspicious bone lesion. Skull base  is unremarkable. Sinuses/Orbits: No acute finding. Other: None. ASPECTS [facility] Stroke Program Early CT Score) - Ganglionic level infarction (caudate, lentiform nuclei, internal capsule, insula, M1-M3 cortex): 7 - Supraganglionic infarction (M4-M6 cortex): 3 Total score (0-10 with 10 being normal): 10 IMPRESSION: 1. No acute intracranial hemorrhage or evidence of acute large vessel territory infarct. ASPECT score is 10. 2. Stable moderate chronic small-vessel disease. Electronically Signed   By: Orvan Falconer M.D.   On: 05/15/2023 18:00    EKG: Independently reviewed.  Interpretation limited secondary to paced rhythm.  Assessment and Plan  Confusion, acute onset left-sided weakness CT head negative for acute abnormality.  Seen by neurology and TNKase not given as symptoms had resolved and chronically anticoagulated with Coumadin.  Neurology canceled code stroke and felt that her presentation was more consistent with toxic metabolic encephalopathy and fatigue in the setting of dementia.  Recommended workup for causes of toxic metabolic encephalopathy.  Neurology has signed off.  UA not suggestive of infection.  Blood ethanol level undetectable.  Will check TSH, B12, and ammonia.  Brain MRI ordered.  AKI on CKD stage IIIb Possibly prerenal from dehydration.  Creatinine currently 2.4 and baseline is 1.7-1.8.  Patient was given 1 L normal saline in the ED. She has passed swallow screen in the ED and no nausea/vomiting.  Encourage p.o. hydration given current systemwide IV fluid shortage.  Repeat labs in the morning to check renal function.  Avoid nephrotoxic agents/hold home torsemide.  Chronic HFpEF Echo done in August 2023 showing EF 50 to 55%.  No signs of volume overload.  Hold torsemide given AKI and check BNP.  A-fib on Coumadin INR 3.1.  Coumadin dosing per pharmacy.  Hyperglycemia Glucose 239 on metabolic panel, now improved to 152 on repeat CBG check.  No documented history of diabetes and  last A1c was 5.3 in August 2023.  Will repeat A1c and order very sensitive sliding scale insulin ACHS.  Mild hypercalcemia Patient was given IV fluids in the ED.  Continue to monitor calcium level and also check PTH and vitamin D levels.  Hyperlipidemia COPD: Stable, no signs of acute exacerbation. Pharmacy med rec pending.  DVT prophylaxis: Coumadin Code Status: Full Code by default.  Patient does not have capacity for decision-making and no family available at this time.  She was listed as full code during her previous hospitalization in August 2023. Level of care: Telemetry bed Admission status: It is my clinical opinion that referral for OBSERVATION is reasonable and necessary in this patient based on the above information provided. The aforementioned taken together are felt to place the patient at high risk for further clinical deterioration. However, it is anticipated that the patient may be medically stable for discharge from the hospital within 24 to 48 hours.  John Giovanni MD [facility]  If 7PM-7AM, please contact night-coverage [email or web address]  05/15/2023, 10:56 PM

## 2023-05-15 NOTE — ED Provider Notes (Signed)
[facility]  Note:  This document was prepared using Conservation officer, historic buildings and may include unintentional dictation errors.  MRN: [id number] DOB: 01/26/1941  Subjective:   Holly Simmons is a 82 y.o. female presenting with her son for concerns of a stroke. Reports that she started to have weakness that started 1-1.5 hours ago at about 15:45.  Has been dropping items she has help with her left hand.  Has also had difficulty with walking, feeling like her left leg was dragging.  Has had decreased appetite for the past week. Per her son, notes that there is baseline confusion, has history of dementia, but he feels there is more confusion since she started having weakness.  No history of stroke, aneurysm, PE, DVT.  Patient is on warfarin, has atrial fibrillation, pacemaker status.  Last INR was 2.5 one month ago. No active chest pain, shob, heart racing, palpitations, headache, dysuria, hematuria. Has a history of small vessel white matter disease.  No current facility-administered medications for this encounter.  Current Outpatient Medications:    allopurinol (ZYLOPRIM) 100 MG tablet, TAKE ONE TABLET BY MOUTH DAILY, Disp: 90 tablet, Rfl: 1   Betamethasone Valerate 0.12 % foam, Apply 1 application topically daily., Disp: 100 g, Rfl: 0   buPROPion (WELLBUTRIN XL) 300 MG 24 hr tablet, Take 1 tablet (300 mg total) by mouth daily., Disp: 90 tablet, Rfl: 0   dronabinol (MARINOL) 5 MG capsule, Take 1 capsule by mouth two times daily before lunch and supper., Disp: 180 capsule, Rfl: 1   famotidine (PEPCID) 40 MG tablet, TAKE ONE TABLET BY MOUTH DAILY, Disp: 90 tablet, Rfl: 1   ferrous sulfate 325 (65 FE) MG tablet, Take 1 tablet (325 mg total) by mouth 2 (two) times daily with a meal. (Patient taking differently: Take 325-650 mg by mouth daily with breakfast.), Disp: 180 tablet, Rfl: 1   HYDROcodone-acetaminophen (NORCO/VICODIN) 5-325 MG tablet, Take 1 tablet by  mouth every 6 (six) hours as needed., Disp: 14 tablet, Rfl: 0   hydrocortisone (ANUSOL-HC) 25 MG suppository, Place 1 suppository (25 mg total) rectally 2 (two) times daily., Disp: 14 suppository, Rfl: 0   ipratropium (ATROVENT) 0.03 % nasal spray, USE 2 SPRAYS IN EACH NOSTRIL 3 TIMES A DAY AS NEEDED. (Patient taking differently: Place 2 sprays into both nostrils daily as needed (nose bleed).), Disp: 30 mL, Rfl: 0   lamoTRIgine (LAMICTAL) 25 MG tablet, Take 1 tablet (25 mg total) by mouth daily., Disp: 90 tablet, Rfl: 0   linaclotide (LINZESS) 72 MCG capsule, TAKE 1 CAPSULE EVERY DAY BEFORE BREAKFAST, Disp: 90 capsule, Rfl: 0   metoprolol tartrate (LOPRESSOR) 25 MG tablet, TAKE ONE TABLET BY MOUTH TWICE DAILY, Disp: 180 tablet, Rfl: 1   modafinil (PROVIGIL) 100 MG tablet, Take 1 tablet (100 mg total) by mouth daily. (Patient taking differently: Take 100 mg by mouth daily as needed (sleep disorder).), Disp: 30 tablet, Rfl: 5   Pitavastatin Calcium 2 MG TABS, TAKE ONE TABLET BY MOUTH DAILY, Disp: 90 tablet, Rfl: 1   PRESCRIPTION MEDICATION, 1 Dose by Other route at bedtime as needed (sleep). CPAP, Disp: , Rfl:    Propylene Glycol (SYSTANE BALANCE OP), Place 1 drop into both eyes 4 (four) times daily as needed (dry eyes)., Disp: , Rfl:    REXULTI 0.25 MG TABS tablet, TAKE 1 TABLET BY MOUTH DAILY, Disp: 90 tablet, Rfl: 0   SYMBICORT 80-4.5 MCG/ACT inhaler, USE 2 PUFFS TWICE A DAY. (Patient taking differently:  2 puffs 2 (two) times daily.), Disp: 30.6 g, Rfl: 5   torsemide (DEMADEX) 20 MG tablet, Take 1 tablet (20 mg total) by mouth daily., Disp: 90 tablet, Rfl: 1   Vilazodone HCl (VIIBRYD) 40 MG TABS, TAKE ONE TABLET BY MOUTH DAILY, Disp: 90 tablet, Rfl: 1   warfarin (COUMADIN) 1 MG tablet, Take 1 tablet (1 mg total) by mouth daily. TAKE 1 1/2 TABLET EVERY WEDNESDAY OR AS DIRECTED BY ANTICOAGULATION LCINIC, Disp: 20 tablet, Rfl: 0   warfarin (COUMADIN) 2.5 MG tablet, TAKE 1 TABLET BY MOUTH DAILY EXCEPT  TAKE 1/2 TABLET ON WEDNESDAYS OR AS DIRECTED BY ANTICOAGULATION CLINIC, Disp: 180 tablet, Rfl: 1   Allergies  Allergen Reactions   Petrolatum-Zinc Oxide Anaphylaxis, Rash and Swelling   Sulfa Antibiotics Rash   Sulfonamide Derivatives Anaphylaxis and Swelling   Tetanus Toxoid     Other reaction(s): Other (See Comments) OTHER REACTION   Tetanus Toxoids Swelling   Other Rash and Other (See Comments)    STERI - STRIPS  It can affect proteins in her kidneys so she doesn't take REACTION: proteinuria Other reaction(s): Other (See Comments), Unknown It can affect proteins in her kidneys so she doesn't take   Nsaids Other (See Comments)    REACTION: proteinuria Other reaction(s): Other (See Comments), Unknown It can affect proteins in her kidneys so she doesn't take    Tetanus Toxoid, Adsorbed Other (See Comments)    Other reaction(s): Other (See Comments) Turned arm red Turned arm red    Past Medical History:  Diagnosis Date   Allergic rhinitis    Asthma    NOS w/ acute exacerbation   Blood transfusion without reported diagnosis    CKD (chronic kidney disease) stage 3, GFR 30-59 ml/min (HCC) 09/12/2018   Colon polyps    TUBULAR ADENOMAS AND HYPERPLASTIC   Complete heart block (HCC)    requiring PPM (MDT) post surgical myomectomy at [facility],  leads are epicardial with abdominal implant, high ventricular threshold at implant   COPD (chronic obstructive pulmonary disease) (HCC)    Depressive disorder    Diastolic dysfunction    DM (diabetes mellitus) (HCC)    Gallstones    GERD (gastroesophageal reflux disease)    Gout 08/20/2012   Heart murmur    Hyperpotassemia    Hypersomnia    Hypertension    Hypertrophic cardiomyopathy (HCC)    s/p surgical myomectomy at [facility] 11/12 complicated by septal VSD post procedure requiring reoperation with patch closure and tricuspid valve replacement   Kidney stones    Myocardial infarction (HCC) 2011   Obstructive sleep apnea    persistent  daytime sleepiness despite cpap   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   Psoriasis    Pyuria    Renal insufficiency    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   Typical atrial flutter (HCC) 9/15     Past Surgical History:  Procedure Laterality Date   ABDOMINAL HYSTERECTOMY     APPENDECTOMY     BREAST CYST EXCISION     CARDIOVERSION N/A 08/01/2014   Procedure: CARDIOVERSION;  Surgeon: Vesta Mixer, MD;  Location: [facility] ENDOSCOPY;  Service: Cardiovascular;  Laterality: N/A;   CESAREAN SECTION     CHOLECYSTECTOMY     COLONOSCOPY     EYE SURGERY  01/16/2016   left eye lens implant   EYE SURGERY  01/09/2016   right eye lens implant   INSERT / REPLACE / REMOVE PACEMAKER  07/13/2012   PACEMAKER INSERTION  07/02/2011  epicardial wires with abdominal implant at [facility] 12/12,  high ventricular lead threshold at implant per Dr Christin Fudge   PERMANENT PACEMAKER INSERTION N/A 07/13/2012   Procedure: PERMANENT PACEMAKER INSERTION;  Surgeon: Hillis Range, MD;  Location: [facility] CATH LAB;  Service: Cardiovascular;  Laterality: N/A;   POLYPECTOMY     septal myomectomy for hypertrophic CM  06/23/2011   by Dr Silvestre Mesi at [facility], complicated by septal VSD requiring patch repair and tricuspid valve replacement   TONSILLECTOMY     TOOTH EXTRACTION     TRICUSPID VALVE REPLACEMENT  06/2011   Medtronic 27mm Mosaic tissue valve   VSD REPAIR  06/2011    Family History  Problem Relation Age of Onset   Hypertension Daughter    Heart disease Maternal Grandfather    Heart disease Paternal Grandfather    Bladder Cancer Father    Prostate cancer Father    Cancer Father    Varicose Veins Father    Heart attack Father    Breast cancer Mother    Dementia Mother    Hypertension Mother    Cancer Mother    Ovarian cancer Maternal Aunt    Pancreatic cancer Cousin    Breast cancer Paternal Aunt    Dementia Maternal Aunt        x 2   Dementia Maternal Uncle        x 2   Colon cancer Neg Hx    Rectal cancer Neg Hx     Stomach cancer Neg Hx    Esophageal cancer Neg Hx     Social History   Tobacco Use   Smoking status: Never   Smokeless tobacco: Never  Vaping Use   Vaping status: Never Used  Substance Use Topics   Alcohol use: No    Alcohol/week: 0.0 standard drinks of alcohol   Drug use: No    ROS   Objective:   Vitals: BP (!) 157/77 (BP Location: Right Arm)   Pulse 72   Temp 98.2 F (36.8 C) (Oral)   Resp 18   SpO2 94%   Wt Readings from Last 3 Encounters:  04/15/23 170 lb (77.1 kg)  03/03/23 167 lb (75.8 kg)  02/10/23 170 lb (77.1 kg)   Wt Readings from Last 3 Encounters:  04/15/23 170 lb (77.1 kg)  03/03/23 167 lb (75.8 kg)  02/10/23 170 lb (77.1 kg)   Temp Readings from Last 3 Encounters:  05/15/23 98.2 F (36.8 C) (Oral)  03/03/23 98.3 F (36.8 C) (Oral)  02/10/23 97.9 F (36.6 C) (Oral)   BP Readings from Last 3 Encounters:  05/15/23 (!) 163/73  04/15/23 117/61  03/03/23 128/76   Pulse Readings from Last 3 Encounters:  05/15/23 72  04/15/23 89  03/03/23 90    Physical Exam Constitutional:      General: She is not in acute distress.    Appearance: Normal appearance. She is well-developed. She is not ill-appearing, toxic-appearing or diaphoretic.  HENT:     Head: Normocephalic and atraumatic.     Right Ear: External ear normal.     Left Ear: External ear normal.     Nose: Nose normal.     Mouth/Throat:     Mouth: Mucous membranes are moist.  Eyes:     General: No scleral icterus.       Right eye: No discharge.        Left eye: No discharge.     Extraocular Movements: Extraocular movements intact.     Pupils: Pupils  are equal, round, and reactive to light.  Cardiovascular:     Rate and Rhythm: Normal rate and regular rhythm.     Heart sounds: Normal heart sounds. No murmur heard.    No friction rub. No gallop.  Pulmonary:     Effort: Pulmonary effort is normal. No respiratory distress.     Breath sounds: No stridor. No wheezing, rhonchi or rales.   Chest:     Chest wall: No tenderness.  Skin:    General: Skin is warm and dry.  Neurological:     General: No focal deficit present.     Mental Status: She is alert and oriented to person, place, and time.     Cranial Nerves: No facial asymmetry.     Motor: Weakness and pronator drift (left sided) present.     Coordination: Coordination abnormal.     Gait: Gait abnormal.  Psychiatric:        Mood and Affect: Mood normal.        Behavior: Behavior normal.    Results for orders placed or performed during the hospital encounter of 05/15/23 (from the past 24 hour(s))  POCT CBG (manual entry)     Status: Abnormal   Collection Time: 05/15/23  5:14 PM  Result Value Ref Range   POCT Glucose ([facility]) 239 (A) 70 - 99 mg/dL   *Note: Due to a large number of results and/or encounters for the requested time period, some results have not been displayed. A complete set of results can be found in Results Review.    Assessment and Plan :   PDMP not reviewed this encounter.  1. Acute left-sided weakness    Patient is in need of a higher level of care than we can provide in the urgent care setting. Primarily needs rule out of an acute encephalopathy, TIA, stroke, aneurysm, intracranial hemorrhage. Has significant risks including CKD, CHF, diabetes, heart disease. Therefore, also requires work up to rule out electrolyte disturbances, HHNS, acute cystitis.  Patient is to be transported to [facility] for further evaluation and intervention.  Case reported out to EMS, transfer of care completed.   Wallis Bamberg, [place] 05/15/23 1727

## 2023-05-15 NOTE — ED Triage Notes (Signed)
Delayed entry due to direct patient care. Patient presents as code stroke via EMS from urgent care. Per EMS, at approximately 1545, patient was in the car with her son when she began having difficulty grasping her sunglasses with her left hand. Per EMS, once the patient was home, her son noted that she had a shuffling gait and appeared to be dragging her left leg. Per EMS, patient then went to urgent care where they noted a left arm drift that was resolved upon EMS arrival. Per EMS, patient has a history of dementia and last took her coumadin yesterday. Patient is lethargic upon arrival; requiring frequent stimuli via voice and touch to remain awake and answer questions. Patient transported to/from CT by RN.

## 2023-05-15 NOTE — ED Notes (Signed)
Family inquiring about update; this RN explained that he was unable to provide update since he was not the pt's primary nurse.  This RN confirmed their phone numbers in chart for further updates as needed.

## 2023-05-15 NOTE — ED Provider Notes (Signed)
[place] [facility]ERGENCY DEPARTMENT AT [facility] Provider Note  CSN: [id number] Arrival date & time: 05/15/23 1740  Chief Complaint(s) Code Stroke  HPI Holly Simmons is a 82 y.o. female history of CKD, heart block status post pacemaker, COPD, diabetes, hypertension, hyperlipidemia, CHF, VSD on warfarin presenting to the emergency department with weakness.  Family report patient was maybe slightly more confused yesterday, developed a cough, today seemed more confused, had some abnormal speech, son was concerned that she seemed have some weakness on the left side.  He took her to urgent care, urgent care activated code stroke reported to the emergency department.  She was seen by neurology on arrival.  Patient not able to provide much history.  History limited due to underlying dementia.  Past Medical History Past Medical History:  Diagnosis Date   Allergic rhinitis    Asthma    NOS w/ acute exacerbation   Blood transfusion without reported diagnosis    CKD (chronic kidney disease) stage 3, GFR 30-59 ml/min (HCC) 09/12/2018   Colon polyps    TUBULAR ADENOMAS AND HYPERPLASTIC   Complete heart block (HCC)    requiring PPM (MDT) post surgical myomectomy at [facility],  leads are epicardial with abdominal implant, high ventricular threshold at implant   COPD (chronic obstructive pulmonary disease) (HCC)    Depressive disorder    Diastolic dysfunction    DM (diabetes mellitus) (HCC)    Gallstones    GERD (gastroesophageal reflux disease)    Gout 08/20/2012   Heart murmur    Hyperpotassemia    Hypersomnia    Hypertension    Hypertrophic cardiomyopathy (HCC)    s/p surgical myomectomy at [facility] 11/12 complicated by septal VSD post procedure requiring reoperation with patch closure and tricuspid valve replacement   Kidney stones    Myocardial infarction (HCC) 2011   Obstructive sleep apnea    persistent daytime sleepiness despite cpap   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   Psoriasis    Pyuria     Renal insufficiency    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   Typical atrial flutter (HCC) 9/15   Patient Active Problem List   Diagnosis Date Noted   Stroke-like symptoms 05/15/2023   Cellulitis and abscess of upper extremity 02/10/2023   Skin lesion of left arm 02/10/2023   Dementia (HCC) 09/02/2022   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   Severe episode of recurrent major depressive disorder, without psychotic features (HCC) 02/26/2022   PAF (paroxysmal atrial fibrillation) (HCC) 08/31/2021   Thrombocytopenia (HCC) 12/04/2020   Encounter for general adult medical examination with abnormal findings 07/12/2020   Cachexia (HCC) 08/16/2019   Iron deficiency anemia due to chronic blood loss 09/23/2018   Stage 3b chronic kidney disease (CKD) (HCC) 09/12/2018   Microalbuminuria due to type 2 diabetes mellitus (HCC) 04/21/2018   Obesity (BMI 30.0-34.9) 02/05/2016   Osteoporosis with pathological fracture of ankle and foot 05/30/2015   Encounter for monitoring SBE (subacute bacterial endocarditis) prophylaxis 11/22/2014   Gout 08/20/2012   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 06/10/2012   Other screening mammogram 02/04/2012   Hyperlipidemia LDL goal <130 12/04/2011   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 Authorizing Provider  allopurinol (ZYLOPRIM) 100 MG tablet TAKE ONE TABLET BY MOUTH DAILY 12/07/22   Etta Grandchild, MD  Betamethasone Valerate 0.12 % foam Apply 1 application topically daily. 04/02/23   Etta Grandchild, MD  buPROPion  (WELLBUTRIN XL) 300 MG 24 hr tablet Take 1 tablet (300 mg total) by mouth daily. 05/01/23   Etta Grandchild, MD  dronabinol (MARINOL) 5 MG capsule Take 1 capsule by mouth two times daily before lunch and supper. 12/08/22   Etta Grandchild, MD  famotidine (PEPCID) 40 MG tablet TAKE ONE TABLET BY MOUTH DAILY 12/07/22   Etta Grandchild, MD  ferrous sulfate 325 (65 FE) MG tablet Take 1 tablet (325 mg total) by mouth 2 (two) times daily with a meal. Patient taking differently: Take 325-650 mg by mouth daily with breakfast. 11/10/20   Etta Grandchild, MD  HYDROcodone-acetaminophen (NORCO/VICODIN) 5-325 MG tablet Take 1 tablet by mouth every 6 (six) hours as needed. 12/24/22   Clark, Meghan R, PA-C  hydrocortisone (ANUSOL-HC) 25 MG suppository Place 1 suppository (25 mg total) rectally 2 (two) times daily. 10/23/22   Wallis Bamberg, PA-C  ipratropium (ATROVENT) 0.03 % nasal spray USE 2 SPRAYS IN EACH NOSTRIL 3 TIMES A DAY AS NEEDED. Patient taking differently: Place 2 sprays into both nostrils daily as needed (nose bleed). 04/29/18   Coralyn Helling, MD  lamoTRIgine (LAMICTAL) 25 MG tablet Take 1 tablet (25 mg total) by mouth daily. 05/01/23   Etta Grandchild, MD  linaclotide [facility]) 72 MCG capsule TAKE 1 CAPSULE EVERY DAY BEFORE BREAKFAST 03/23/23   Etta Grandchild, MD  metoprolol tartrate (LOPRESSOR) 25 MG tablet TAKE ONE TABLET BY MOUTH TWICE DAILY 04/02/23   Etta Grandchild, MD  modafinil (PROVIGIL) 100 MG tablet Take 1 tablet (100 mg total) by mouth daily. Patient taking differently: Take 100 mg by mouth daily as needed (sleep disorder). 03/29/18   Coralyn Helling, MD  Pitavastatin Calcium 2 MG TABS TAKE ONE TABLET BY MOUTH DAILY 04/02/23   Etta Grandchild, MD  PRESCRIPTION MEDICATION 1 Dose by Other route at bedtime as needed (sleep). CPAP    [provider]  Propylene Glycol (SYSTANE BALANCE OP) Place 1 drop into both eyes 4 (four) times daily as needed (dry eyes).    [provider]  REXULTI 0.25 MG  TABS tablet TAKE 1 TABLET BY MOUTH DAILY 03/31/23   Etta Grandchild, MD  SYMBICORT 80-4.5 MCG/ACT inhaler USE 2 PUFFS TWICE A DAY. Patient taking differently: 2 puffs 2 (two) times daily. 10/11/20   Etta Grandchild, MD  torsemide (DEMADEX) 20 MG tablet Take 1 tablet (20 mg total) by mouth daily. 04/02/23   Etta Grandchild, MD  Vilazodone HCl (VIIBRYD) 40 MG TABS TAKE ONE TABLET BY MOUTH DAILY 12/07/22   Etta Grandchild, MD  warfarin (COUMADIN) 1 MG tablet Take 1 tablet (1 mg total) by mouth daily. TAKE 1 1/2 TABLET EVERY WEDNESDAY OR AS DIRECTED BY ANTICOAGULATION LCINIC 02/26/22   Etta Grandchild, MD  warfarin (COUMADIN) 2.5 MG tablet TAKE 1 TABLET BY MOUTH DAILY EXCEPT TAKE 1/2 TABLET ON WEDNESDAYS OR AS DIRECTED BY ANTICOAGULATION CLINIC 08/08/22   Etta Grandchild, MD  Past Surgical History Past Surgical History:  Procedure Laterality Date   ABDOMINAL HYSTERECTOMY     APPENDECTOMY     BREAST CYST EXCISION     CARDIOVERSION N/A 08/01/2014   Procedure: CARDIOVERSION;  Surgeon: Vesta Mixer, MD;  Location: [facility] ENDOSCOPY;  Service: Cardiovascular;  Laterality: N/A;   CESAREAN SECTION     CHOLECYSTECTOMY     COLONOSCOPY     EYE SURGERY  01/16/2016   left eye lens implant   EYE SURGERY  01/09/2016   right eye lens implant   INSERT / REPLACE / REMOVE PACEMAKER  07/13/2012   PACEMAKER INSERTION  07/02/2011   epicardial wires with abdominal implant at [facility] 12/12,  high ventricular lead threshold at implant per Dr Christin Fudge   PERMANENT PACEMAKER INSERTION N/A 07/13/2012   Procedure: PERMANENT PACEMAKER INSERTION;  Surgeon: Hillis Range, MD;  Location: [facility] CATH LAB;  Service: Cardiovascular;  Laterality: N/A;   POLYPECTOMY     septal myomectomy for hypertrophic CM  06/23/2011   by Dr Silvestre Mesi at [facility], complicated by septal VSD requiring patch repair and tricuspid valve  replacement   TONSILLECTOMY     TOOTH EXTRACTION     TRICUSPID VALVE REPLACEMENT  06/2011   Medtronic 27mm Mosaic tissue valve   VSD REPAIR  06/2011   Family History Family History  Problem Relation Age of Onset   Hypertension Daughter    Heart disease Maternal Grandfather    Heart disease Paternal Grandfather    Bladder Cancer Father    Prostate cancer Father    Cancer Father    Varicose Veins Father    Heart attack Father    Breast cancer Mother    Dementia Mother    Hypertension Mother    Cancer Mother    Ovarian cancer Maternal Aunt    Pancreatic cancer Cousin    Breast cancer Paternal Aunt    Dementia Maternal Aunt        x 2   Dementia Maternal Uncle        x 2   Colon cancer Neg Hx    Rectal cancer Neg Hx    Stomach cancer Neg Hx    Esophageal cancer Neg Hx     Social History Social History   Tobacco Use   Smoking status: Never   Smokeless tobacco: Never  Vaping Use   Vaping status: Never Used  Substance Use Topics   Alcohol use: No    Alcohol/week: 0.0 standard drinks of alcohol   Drug use: No   Allergies Petrolatum-zinc oxide; Sulfa antibiotics; Sulfonamide derivatives; Tetanus toxoid; Tetanus toxoids; Other; Nsaids; and Tetanus toxoid, adsorbed  Review of Systems Review of Systems  Unable to perform ROS: Dementia    Physical Exam Vital Signs  I have reviewed the triage vital signs BP (!) 130/55   Pulse 61   Temp 99.2 F (37.3 C) (Oral)   Resp (!) 22   Ht 5\' 3"  (1.6 m)   Wt 77.1 kg   SpO2 93%   BMI 30.11 kg/m  Physical Exam Vitals and nursing note reviewed.  Constitutional:      General: She is not in acute distress.    Appearance: She is well-developed.  HENT:     Head: Normocephalic and atraumatic.     Mouth/Throat:     Mouth: Mucous membranes are moist.  Eyes:     Pupils: Pupils are equal, round, and reactive to light.  Cardiovascular:     Rate and Rhythm: Normal rate and  regular rhythm.     Heart sounds: No murmur  heard. Pulmonary:     Effort: Pulmonary effort is normal. No respiratory distress.     Breath sounds: Rales (bibasilar crackles) present.  Abdominal:     General: Abdomen is flat.     Palpations: Abdomen is soft.     Tenderness: There is no abdominal tenderness.  Musculoskeletal:        General: No tenderness.     Right lower leg: No edema.     Left lower leg: No edema.  Skin:    General: Skin is warm and dry.  Neurological:     Mental Status: She is alert. Mental status is at baseline.     Comments: No obvious neurologic deficit, cranial nerves intact, moves all 4 extremities equally, follows commands, answers questions, oriented times self  Psychiatric:        Mood and Affect: Mood normal.        Behavior: Behavior normal.     ED Results and Treatments Labs (all labs ordered are listed, but only abnormal results are displayed) Labs Reviewed  PROTIME-INR - Abnormal; Notable for the following components:      Result Value   Prothrombin Time 32.2 (*)    INR 3.1 (*)    All other components within normal limits  APTT - Abnormal; Notable for the following components:   aPTT 44 (*)    All other components within normal limits  CBC - Abnormal; Notable for the following components:   WBC 12.3 (*)    RBC 3.71 (*)    Hemoglobin 11.2 (*)    HCT 35.8 (*)    All other components within normal limits  DIFFERENTIAL - Abnormal; Notable for the following components:   Neutro Abs 10.5 (*)    Lymphs Abs 0.4 (*)    Monocytes Absolute 1.2 (*)    All other components within normal limits  COMPREHENSIVE METABOLIC PANEL - Abnormal; Notable for the following components:   Chloride 94 (*)    Glucose, Bld 239 (*)    BUN 32 (*)    Creatinine, Ser 2.47 (*)    Calcium 10.9 (*)    Alkaline Phosphatase 176 (*)    Total Bilirubin 1.5 (*)    GFR, Estimated 19 (*)    All other components within normal limits  URINALYSIS, W/ REFLEX TO CULTURE (INFECTION SUSPECTED) - Abnormal; Notable for the  following components:   Glucose, UA 50 (*)    Protein, ur 30 (*)    Bacteria, UA RARE (*)    All other components within normal limits  I-STAT CHEM 8, ED - Abnormal; Notable for the following components:   Potassium 5.7 (*)    Chloride 95 (*)    BUN 51 (*)    Creatinine, Ser 2.60 (*)    Glucose, Bld 264 (*)    Calcium, Ion 1.14 (*)    All other components within normal limits  CBG MONITORING, ED - Abnormal; Notable for the following components:   Glucose-Capillary 255 (*)    All other components within normal limits  CBG MONITORING, ED - Abnormal; Notable for the following components:   Glucose-Capillary 152 (*)    All other components within normal limits  SARS CORONAVIRUS 2 BY RT PCR  ETHANOL  Radiology DG Chest Portable 1 View  Result Date: 05/15/2023 CLINICAL DATA:  Cough EXAM: PORTABLE CHEST 1 VIEW COMPARISON:  Radiograph 07/01/2022 FINDINGS: Left chest wall CRT-P. Sternotomy and CABG. Epicardial pacing wires. Stable cardiomediastinal silhouette. Aortic atherosclerotic calcification. Low lung volumes accentuate pulmonary vascularity. No pleural effusion or pneumothorax. IMPRESSION: Low lung volumes.  No active disease. Electronically Signed   By: Minerva Fester M.D.   On: 05/15/2023 21:57   CT HEAD CODE STROKE WO CONTRAST  Result Date: 05/15/2023 CLINICAL DATA:  Code stroke. Neuro deficit, acute, stroke suspected. Left-sided weakness. EXAM: CT HEAD WITHOUT CONTRAST TECHNIQUE: Contiguous axial images were obtained from the base of the skull through the vertex without intravenous contrast. RADIATION DOSE REDUCTION: This exam was performed according to the departmental dose-optimization program which includes automated exposure control, adjustment of the mA and/or kV according to patient size and/or use of iterative reconstruction technique. COMPARISON:  Head CT  03/19/2022. FINDINGS: Brain: No acute hemorrhage. Unchanged moderate chronic small-vessel disease. Cortical gray-white differentiation is otherwise preserved. Prominence of the ventricles and sulci within expected range for age. No hydrocephalus or extra-axial collection. No mass effect or midline shift. Vascular: No hyperdense vessel or unexpected calcification. Skull: No calvarial fracture or suspicious bone lesion. Skull base is unremarkable. Sinuses/Orbits: No acute finding. Other: None. ASPECTS [facility] Stroke Program Early CT Score) - Ganglionic level infarction (caudate, lentiform nuclei, internal capsule, insula, M1-M3 cortex): 7 - Supraganglionic infarction (M4-M6 cortex): 3 Total score (0-10 with 10 being normal): 10 IMPRESSION: 1. No acute intracranial hemorrhage or evidence of acute large vessel territory infarct. ASPECT score is 10. 2. Stable moderate chronic small-vessel disease. Electronically Signed   By: Orvan Falconer M.D.   On: 05/15/2023 18:00    Pertinent labs & imaging results that were available during my care of the patient were reviewed by me and considered in my medical decision making (see MDM for details).  Medications Ordered in ED Medications  sodium chloride flush (NS) 0.9 % injection 3 mL (3 mLs Intravenous Given 05/15/23 1811)  sodium chloride 0.9 % bolus 1,000 mL (0 mLs Intravenous Stopped 05/15/23 2136)                                                                                                                                     Procedures Procedures  (including critical care time)  Medical Decision Making / ED Course   MDM:  82 year old presenting to the emergency department with weakness, confusion.  Patient well-appearing, does have some abnormal speech but otherwise no clear neurologic deficit.  Does have some bibasilar crackles, cough.  Patient seen by neurology given potentially left-sided weakness.  Neurology did not appreciate any neurologic  deficits.  Patient also on warfarin so not TNK candidate.  Discussed with family and they report, waxing and waning altered mental status, seems similar to dementia.  Family reports that she has had similar presentation before with prior infection.  Urinalysis today without signs of infection.  She did have some bibasilar crackles bilaterally and cough, chest x-ray pending.  Labs notable for AKI.  Suspect likely toxic metabolic encephalopathy, possibly due to dehydration.  Discussed with the hospitalist who admit the patient, pending x-ray.  COVID test is negative.  If x-ray ultimately returns positive would treat with antibiotics.      Additional history obtained: -Additional history obtained from ems -External records from outside source obtained and reviewed including: Chart review including previous notes, labs, imaging, consultation notes including prior notes    Lab Tests: -I ordered, reviewed, and interpreted labs.   The pertinent results include:   Labs Reviewed  PROTIME-INR - Abnormal; Notable for the following components:      Result Value   Prothrombin Time 32.2 (*)    INR 3.1 (*)    All other components within normal limits  APTT - Abnormal; Notable for the following components:   aPTT 44 (*)    All other components within normal limits  CBC - Abnormal; Notable for the following components:   WBC 12.3 (*)    RBC 3.71 (*)    Hemoglobin 11.2 (*)    HCT 35.8 (*)    All other components within normal limits  DIFFERENTIAL - Abnormal; Notable for the following components:   Neutro Abs 10.5 (*)    Lymphs Abs 0.4 (*)    Monocytes Absolute 1.2 (*)    All other components within normal limits  COMPREHENSIVE METABOLIC PANEL - Abnormal; Notable for the following components:   Chloride 94 (*)    Glucose, Bld 239 (*)    BUN 32 (*)    Creatinine, Ser 2.47 (*)    Calcium 10.9 (*)    Alkaline Phosphatase 176 (*)    Total Bilirubin 1.5 (*)    GFR, Estimated 19 (*)    All other  components within normal limits  URINALYSIS, W/ REFLEX TO CULTURE (INFECTION SUSPECTED) - Abnormal; Notable for the following components:   Glucose, UA 50 (*)    Protein, ur 30 (*)    Bacteria, UA RARE (*)    All other components within normal limits  I-STAT CHEM 8, ED - Abnormal; Notable for the following components:   Potassium 5.7 (*)    Chloride 95 (*)    BUN 51 (*)    Creatinine, Ser 2.60 (*)    Glucose, Bld 264 (*)    Calcium, Ion 1.14 (*)    All other components within normal limits  CBG MONITORING, ED - Abnormal; Notable for the following components:   Glucose-Capillary 255 (*)    All other components within normal limits  CBG MONITORING, ED - Abnormal; Notable for the following components:   Glucose-Capillary 152 (*)    All other components within normal limits  SARS CORONAVIRUS 2 BY RT PCR  ETHANOL    Notable for AKI  EKG   EKG Interpretation Date/Time:  Friday May 15 2023 18:08:10 EDT Ventricular Rate:  65 PR Interval:  159 QRS Duration:  232 QT Interval:  510 QTC Calculation: 531 R Axis:   153  Text Interpretation: AV PACED RHYTHM Confirmed by Alvino Blood ([id number]) on 05/15/2023 8:41:35 PM         Imaging Studies ordered: I ordered imaging studies including CT head, CXR On my interpretation imaging demonstrates no acute process I independently visualized and interpreted imaging. I agree with the radiologist interpretation   Medicines ordered and prescription drug management: Meds ordered this encounter  Medications   sodium chloride flush (NS) 0.9 % injection 3 mL   sodium chloride 0.9 % bolus 1,000 mL    -I have reviewed the patients home medicines and have made adjustments as needed   Consultations Obtained: I requested consultation with the hospitalist and neurologist,  and discussed lab and imaging findings as well as pertinent plan - they recommend: admission   Cardiac Monitoring: The patient was maintained on a cardiac monitor.   I personally viewed and interpreted the cardiac monitored which showed an underlying rhythm of: NSR   Reevaluation: After the interventions noted above, I reevaluated the patient and found that their symptoms have stayed the same  Co morbidities that complicate the patient evaluation  Past Medical History:  Diagnosis Date   Allergic rhinitis    Asthma    NOS w/ acute exacerbation   Blood transfusion without reported diagnosis    CKD (chronic kidney disease) stage 3, GFR 30-59 ml/min (HCC) 09/12/2018   Colon polyps    TUBULAR ADENOMAS AND HYPERPLASTIC   Complete heart block (HCC)    requiring PPM (MDT) post surgical myomectomy at [facility],  leads are epicardial with abdominal implant, high ventricular threshold at implant   COPD (chronic obstructive pulmonary disease) (HCC)    Depressive disorder    Diastolic dysfunction    DM (diabetes mellitus) (HCC)    Gallstones    GERD (gastroesophageal reflux disease)    Gout 08/20/2012   Heart murmur    Hyperpotassemia    Hypersomnia    Hypertension    Hypertrophic cardiomyopathy (HCC)    s/p surgical myomectomy at [facility] 11/12 complicated by septal VSD post procedure requiring reoperation with patch closure and tricuspid valve replacement   Kidney stones    Myocardial infarction (HCC) 2011   Obstructive sleep apnea    persistent daytime sleepiness despite cpap   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   Psoriasis    Pyuria    Renal insufficiency    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   Typical atrial flutter (HCC) 9/15      Dispostion: Disposition decision including need for hospitalization was considered, and patient admitted to the hospital.    Final Clinical Impression(s) / ED Diagnoses Final diagnoses:  Encephalopathy, unspecified type     This chart was dictated using voice recognition software.  Despite best efforts to proofread,  errors can occur which can change the documentation meaning.    Lonell Grandchild,  MD 05/15/23 2209

## 2023-05-15 NOTE — Progress Notes (Signed)
PHARMACY - ANTICOAGULATION CONSULT NOTE  Pharmacy Consult for warfarin Indication: atrial fibrillation  Allergies  Allergen Reactions   Petrolatum-Zinc Oxide Anaphylaxis, Rash and Swelling   Sulfa Antibiotics Rash   Sulfonamide Derivatives Anaphylaxis and Swelling   Tetanus Toxoid     Other reaction(s): Other (See Comments) OTHER REACTION   Tetanus Toxoids Swelling   Other Rash and Other (See Comments)    STERI - STRIPS  It can affect proteins in her kidneys so she doesn't take REACTION: proteinuria Other reaction(s): Other (See Comments), Unknown It can affect proteins in her kidneys so she doesn't take   Nsaids Other (See Comments)    REACTION: proteinuria Other reaction(s): Other (See Comments), Unknown It can affect proteins in her kidneys so she doesn't take    Tetanus Toxoid, Adsorbed Other (See Comments)    Other reaction(s): Other (See Comments) Turned arm red Turned arm red   Vital Signs: Temp: 98.3 F (36.8 C) (10/11 2350) Temp Source: Oral (10/11 2350) BP: 132/53 (10/11 2350) Pulse Rate: 61 (10/11 2350)  Labs: Recent Labs    05/15/23 1742 05/15/23 1748 05/15/23 1828  HGB 11.2* 12.9  --   HCT 35.8* 38.0  --   PLT 173  --   --   APTT 44*  --   --   LABPROT 32.2*  --   --   INR 3.1*  --   --   CREATININE  --  2.60* 2.47*    Estimated Creatinine Clearance: 18.2 mL/min (A) (by C-G formula based on SCr of 2.47 mg/dL (H)).   Medical History: Past Medical History:  Diagnosis Date   Allergic rhinitis    Asthma    NOS w/ acute exacerbation   Blood transfusion without reported diagnosis    CKD (chronic kidney disease) stage 3, GFR 30-59 ml/min (HCC) 09/12/2018   Colon polyps    TUBULAR ADENOMAS AND HYPERPLASTIC   Complete heart block (HCC)    requiring PPM (MDT) post surgical myomectomy at [facility],  leads are epicardial with abdominal implant, high ventricular threshold at implant   COPD (chronic obstructive pulmonary disease) (HCC)    Depressive  disorder    Diastolic dysfunction    DM (diabetes mellitus) (HCC)    Gallstones    GERD (gastroesophageal reflux disease)    Gout 08/20/2012   Heart murmur    Hyperpotassemia    Hypersomnia    Hypertension    Hypertrophic cardiomyopathy (HCC)    s/p surgical myomectomy at [facility] 11/12 complicated by septal VSD post procedure requiring reoperation with patch closure and tricuspid valve replacement   Kidney stones    Myocardial infarction (HCC) 2011   Obstructive sleep apnea    persistent daytime sleepiness despite cpap   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 year old female admitted on 05/15/2023 with AMS. Warfarin prior to admission for atrial flutter. Last anticoagulation appointment 04/17/23 where INR ar goal 2.5. PTA dosing 1.25mg  on Mondays and 2.5 all other days (total weekly 16.25mg ). LD PTA 10/10 at 1730. Pharmacy consulted to dose warfarin.  10/11 INR 3.1, slightly supratherapeutic No overt s/sx bleeding. Currently in AKI   Goal of Therapy:  INR 2-3 Monitor platelets by anticoagulation protocol: Yes   Plan:  Hold warfarin dose this evening Plan to resume home regimen  as able Daily INR, CBC and monitor for bleeding  Thank you for allowing pharmacy to participate in this patient's care.  Marja Kays, PharmD Emergency Medicine Clinical Pharmacist 05/16/2023,1:01 AM

## 2023-05-15 NOTE — Consult Note (Signed)
Neurology Consultation  Reason for Consult: Acute onset left-sided weakness Referring Physician: Dr. Suezanne Jacquet  CC: None  History is obtained from: EMS and chart  HPI: Holly Simmons is a 82 y.o. 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  Patient was riding in the car with her son when she suddenly had trouble moving her left side.  He took her to urgent care, where she was evaluated and sent to the ER for strokelike symptoms.  On arrival, patient is drowsy but has good strength in bilateral upper and lower extremities.   LKW: 1455 TNK given?: no, symptoms resolved and patient on Coumadin IR Thrombectomy? No, exam not consistent with LVO Modified Rankin Scale: 3-Moderate disability-requires help but walks WITHOUT assistance  ROS: A complete ROS was performed and is negative except as noted in the HPI.   Past Medical History:  Diagnosis Date   Allergic rhinitis    Asthma    NOS w/ acute exacerbation   Blood transfusion without reported diagnosis    CKD (chronic kidney disease) stage 3, GFR 30-59 ml/min (HCC) 09/12/2018   Colon polyps    TUBULAR ADENOMAS AND HYPERPLASTIC   Complete heart block (HCC)    requiring PPM (MDT) post surgical myomectomy at [facility],  leads are epicardial with abdominal implant, high ventricular threshold at implant   COPD (chronic obstructive pulmonary disease) (HCC)    Depressive disorder    Diastolic dysfunction    DM (diabetes mellitus) (HCC)    Gallstones    GERD (gastroesophageal reflux disease)    Gout 08/20/2012   Heart murmur    Hyperpotassemia    Hypersomnia    Hypertension    Hypertrophic cardiomyopathy (HCC)    s/p surgical myomectomy at [facility] 11/12 complicated by septal VSD post procedure requiring reoperation with patch closure and tricuspid valve  replacement   Kidney stones    Myocardial infarction (HCC) 2011   Obstructive sleep apnea    persistent daytime sleepiness despite cpap   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   Psoriasis    Pyuria    Renal insufficiency    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   Typical atrial flutter (HCC) 9/15     Family History  Problem Relation Age of Onset   Hypertension Daughter    Heart disease Maternal Grandfather    Heart disease Paternal Grandfather    Bladder Cancer Father    Prostate cancer Father    Cancer Father    Varicose Veins Father    Heart attack Father    Breast cancer Mother    Dementia Mother    Hypertension Mother    Cancer Mother    Ovarian cancer Maternal Aunt    Pancreatic cancer Cousin    Breast cancer Paternal Aunt    Dementia Maternal Aunt        x 2   Dementia Maternal Uncle        x 2   Colon cancer Neg Hx    Rectal cancer Neg Hx    Stomach cancer Neg Hx    Esophageal cancer Neg Hx      Social History:   reports that she has never smoked. She has never used smokeless tobacco. She reports that she does not drink alcohol and does not use drugs.  Medications  Current Facility-Administered Medications:    sodium chloride flush (NS) 0.9 %  injection 3 mL, 3 mL, Intravenous, Once, Scheving, Jerilee Field, MD  Current Outpatient Medications:    allopurinol (ZYLOPRIM) 100 MG tablet, TAKE ONE TABLET BY MOUTH DAILY, Disp: 90 tablet, Rfl: 1   Betamethasone Valerate 0.12 % foam, Apply 1 application topically daily., Disp: 100 g, Rfl: 0   buPROPion (WELLBUTRIN XL) 300 MG 24 hr tablet, Take 1 tablet (300 mg total) by mouth daily., Disp: 90 tablet, Rfl: 0   dronabinol (MARINOL) 5 MG capsule, Take 1 capsule by mouth two times daily before lunch and supper., Disp: 180 capsule, Rfl: 1   famotidine (PEPCID) 40 MG tablet, TAKE ONE TABLET BY MOUTH DAILY, Disp: 90 tablet, Rfl: 1   ferrous sulfate 325 (65 FE) MG tablet, Take 1 tablet (325 mg total) by mouth 2 (two) times daily  with a meal. (Patient taking differently: Take 325-650 mg by mouth daily with breakfast.), Disp: 180 tablet, Rfl: 1   HYDROcodone-acetaminophen (NORCO/VICODIN) 5-325 MG tablet, Take 1 tablet by mouth every 6 (six) hours as needed., Disp: 14 tablet, Rfl: 0   hydrocortisone (ANUSOL-HC) 25 MG suppository, Place 1 suppository (25 mg total) rectally 2 (two) times daily., Disp: 14 suppository, Rfl: 0   ipratropium (ATROVENT) 0.03 % nasal spray, USE 2 SPRAYS IN EACH NOSTRIL 3 TIMES A DAY AS NEEDED. (Patient taking differently: Place 2 sprays into both nostrils daily as needed (nose bleed).), Disp: 30 mL, Rfl: 0   lamoTRIgine (LAMICTAL) 25 MG tablet, Take 1 tablet (25 mg total) by mouth daily., Disp: 90 tablet, Rfl: 0   linaclotide (LINZESS) 72 MCG capsule, TAKE 1 CAPSULE EVERY DAY BEFORE BREAKFAST, Disp: 90 capsule, Rfl: 0   metoprolol tartrate (LOPRESSOR) 25 MG tablet, TAKE ONE TABLET BY MOUTH TWICE DAILY, Disp: 180 tablet, Rfl: 1   modafinil (PROVIGIL) 100 MG tablet, Take 1 tablet (100 mg total) by mouth daily. (Patient taking differently: Take 100 mg by mouth daily as needed (sleep disorder).), Disp: 30 tablet, Rfl: 5   Pitavastatin Calcium 2 MG TABS, TAKE ONE TABLET BY MOUTH DAILY, Disp: 90 tablet, Rfl: 1   PRESCRIPTION MEDICATION, 1 Dose by Other route at bedtime as needed (sleep). CPAP, Disp: , Rfl:    Propylene Glycol (SYSTANE BALANCE OP), Place 1 drop into both eyes 4 (four) times daily as needed (dry eyes)., Disp: , Rfl:    REXULTI 0.25 MG TABS tablet, TAKE 1 TABLET BY MOUTH DAILY, Disp: 90 tablet, Rfl: 0   SYMBICORT 80-4.5 MCG/ACT inhaler, USE 2 PUFFS TWICE A DAY. (Patient taking differently: 2 puffs 2 (two) times daily.), Disp: 30.6 g, Rfl: 5   torsemide (DEMADEX) 20 MG tablet, Take 1 tablet (20 mg total) by mouth daily., Disp: 90 tablet, Rfl: 1   Vilazodone HCl (VIIBRYD) 40 MG TABS, TAKE ONE TABLET BY MOUTH DAILY, Disp: 90 tablet, Rfl: 1   warfarin (COUMADIN) 1 MG tablet, Take 1 tablet (1 mg  total) by mouth daily. TAKE 1 1/2 TABLET EVERY WEDNESDAY OR AS DIRECTED BY ANTICOAGULATION LCINIC, Disp: 20 tablet, Rfl: 0   warfarin (COUMADIN) 2.5 MG tablet, TAKE 1 TABLET BY MOUTH DAILY EXCEPT TAKE 1/2 TABLET ON WEDNESDAYS OR AS DIRECTED BY ANTICOAGULATION CLINIC, Disp: 180 tablet, Rfl: 1   Exam: Current vital signs: There were no vitals taken for this visit. Vital signs in last 24 hours: Temp:  [98.2 F (36.8 C)] 98.2 F (36.8 C) (10/11 1646) Pulse Rate:  [72] 72 (10/11 1646) Resp:  [18] 18 (10/11 1646) BP: (157-173)/(73-77) 163/73 (10/11 1707) SpO2:  [94 %]  94 % (10/11 1649)  GENERAL: Awake, alert, in no acute distress Psych: Affect appropriate for situation, patient is calm and cooperative with examination Head: Normocephalic and atraumatic, without obvious abnormality EENT: Normal conjunctivae, dry mucous membranes, no OP obstruction LUNGS: Normal respiratory effort. Non-labored breathing on room air CV: Regular rate and rhythm on telemetry ABDOMEN: Soft, non-tender, non-distended Extremities: warm, well perfused, without obvious deformity  NEURO:  Mental Status: Patient is drowsy but arouses to loud voice and touch, she is oriented to person and place, gets month wrong but able to state correct age She is not able to provide a clear and coherent history of present illness. Speech/Language: speech is clear and fluent.   Naming, fluency, and comprehension intact without aphasia  No neglect is noted Cranial Nerves:  II: PERRL. visual fields full.  III, IV, VI: EOMI. Lid elevation symmetric and full.  V: Sensation is intact to light touch and symmetrical to face.  VII: Face is symmetric resting and smiling.  VIII: Hearing intact to voice IX, X: Phonation normal.  XII: Tongue protrudes midline without fasciculations.   Motor: 5/5 strength is all muscle groups.  Tone is normal. Bulk is normal.  Sensation: Intact to light touch bilaterally in all four extremities.  No  extinction to DSS present.  Coordination: FTN intact bilaterally. No pronator drift.   Gait: Deferred  NIHSS: 1a Level of Conscious.: 1 1b LOC Questions: 0 1c LOC Commands: 0 2 Best Gaze: 0 3 Visual: 0 4 Facial Palsy: 0 5a Motor Arm - left: 0 5b Motor Arm - Right: 0 6a Motor Leg - Left: 0 6b Motor Leg - Right: 0 7 Limb Ataxia: 0 8 Sensory: 0 9 Best Language: 0 10 Dysarthria: 0 11 Extinct. and Inatten.: 0 TOTAL: 1   Labs I have reviewed labs in epic and the results pertinent to this consultation are:   CBC    Component Value Date/Time   WBC 5.0 02/25/2023 0000   WBC 5.6 11/19/2022 1000   RBC 3.64 (L) 11/19/2022 1000   HGB 12.9 05/15/2023 1748   HCT 38.0 05/15/2023 1748   PLT 139 (A) 02/25/2023 0000   MCV 94.8 11/19/2022 1000   MCH 29.7 06/28/2022 0332   MCHC 32.6 11/19/2022 1000   RDW 16.1 (H) 11/19/2022 1000   LYMPHSABS 0.6 (L) 11/19/2022 1000   MONOABS 0.6 11/19/2022 1000   EOSABS 0.3 11/19/2022 1000   BASOSABS 0.0 11/19/2022 1000    CMP     Component Value Date/Time   NA 135 05/15/2023 1748   NA 139 02/25/2023 0000   K 5.7 (H) 05/15/2023 1748   CL 95 (L) 05/15/2023 1748   CO2 25 (A) 02/25/2023 0000   GLUCOSE 264 (H) 05/15/2023 1748   BUN 51 (H) 05/15/2023 1748   BUN 26 (A) 02/25/2023 0000   CREATININE 2.60 (H) 05/15/2023 1748   CREATININE 1.35 (H) 10/10/2011 1649   CALCIUM 9.7 02/25/2023 0000   PROT 6.8 03/18/2022 1555   ALBUMIN 4.1 02/25/2023 0000   AST 16 03/18/2022 1555   ALT 12 03/18/2022 1555   ALKPHOS 131 (H) 03/18/2022 1555   BILITOT 0.9 03/18/2022 1555   GFRNONAA 28 (L) 06/28/2022 0332   GFRAA 44 (L) 10/18/2019 2137    Lipid Panel     Component Value Date/Time   CHOL 129 03/19/2022 0042   TRIG 40 03/19/2022 0042   HDL 47 03/19/2022 0042   CHOLHDL 2.7 03/19/2022 0042   VLDL 8 03/19/2022 0042   LDLCALC 74  03/19/2022 0042     Imaging I have reviewed the images obtained:  CT-scan of the brain: No acute  abnormality  Assessment: 82 year old patient with history of hypertension, hyperlipidemia, pacemaker, 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  She was taken to urgent care by her son and then sent here by ambulance.  Paramedics noted some difficulty in dexterity with her left hand but no other deficits.  On arrival to the ED, patient is slightly drowsy but awakens to voice and touch and has symmetrical antigravity strength in all 4 extremities.  CT head is negative for acute abnormality.  Suspect that this is toxic metabolic encephalopathy and/or fatigue in the setting of pre-existing dementia.  Impression: Toxic metabolic encephalopathy and fatigue in the setting of dementia  Recommendations: -Workup for causes of toxic metabolic encephalopathy per primary team  Pt seen by NP/Neuro and later by MD. Note/plan to be edited by MD as needed.  Holly Simmons , MSN, AGACNP-BC [facility] See Amion for schedule and pager information 05/15/2023 5:59 PM  NEUROHOSPITALIST ADDENDUM Performed a face to face diagnostic evaluation.   I have reviewed the contents of history and physical exam as documented by PA/ARNP/Resident and agree with above documentation.  I have discussed and formulated the above plan as documented. Edits to the note have been made as needed.  Impression/Key exam findings/Plan: Suspect encephalopathy and no focal deficit noted on exam with no aphasia out of proportion to encephalopathy. Will cancel stroke code. Labs do show AKI and I suspect her presentation is metabolic.  We will signoff. Please feel free to contact us with any questions or concerns.  Erick Blinks, MD [facility] [phone]   If 7pm to 7am, please call on call as listed on AMION.

## 2023-05-15 NOTE — Code Documentation (Signed)
Stroke Response Nurse Documentation Code Documentation  Holly Simmons is a 82 y.o. female arriving to [facility]  via [facility] on 10/11 with past medical hx of CHF, HLD, DM2, dementia, CKD. On warfarin daily. Code stroke was activated by EMS.   Patient from home where she was LKW at 1545, at which time her son noticed her to begin having trouble moving her L side, specifically while putting her sunglasses on in the car. He took her home and she was dropping items out of her left hand and had a shuffling gait, dragging LLE. He took her to urgent care, who called EMS.   Stroke team at the bedside on patient arrival. Labs drawn and patient cleared for CT by Dr. Suezanne Jacquet. Patient to CT with team. NIHSS 1, see documentation for details and code stroke times. Patient with decreased LOC on exam. The following imaging was completed:  CT Head. Patient is not a candidate for IV Thrombolytic due to symptoms improving and on Coumadin. Patient is not not a candidate for IR due to LVO not suspected.   Care Plan: cancel code stroke    Bedside handoff with ED RN Madelynn.    Pearlie Oyster  Stroke Response RN

## 2023-05-16 ENCOUNTER — Observation Stay (HOSPITAL_COMMUNITY): Payer: Medicare PPO

## 2023-05-16 DIAGNOSIS — J449 Chronic obstructive pulmonary disease, unspecified: Secondary | ICD-10-CM

## 2023-05-16 DIAGNOSIS — R400 Somnolence: Secondary | ICD-10-CM | POA: Diagnosis not present

## 2023-05-16 DIAGNOSIS — N179 Acute kidney failure, unspecified: Secondary | ICD-10-CM | POA: Diagnosis not present

## 2023-05-16 DIAGNOSIS — R569 Unspecified convulsions: Secondary | ICD-10-CM

## 2023-05-16 DIAGNOSIS — I5032 Chronic diastolic (congestive) heart failure: Secondary | ICD-10-CM

## 2023-05-16 DIAGNOSIS — R531 Weakness: Secondary | ICD-10-CM | POA: Diagnosis not present

## 2023-05-16 DIAGNOSIS — G9341 Metabolic encephalopathy: Secondary | ICD-10-CM | POA: Diagnosis not present

## 2023-05-16 DIAGNOSIS — I480 Paroxysmal atrial fibrillation: Secondary | ICD-10-CM | POA: Diagnosis not present

## 2023-05-16 DIAGNOSIS — R410 Disorientation, unspecified: Secondary | ICD-10-CM | POA: Diagnosis not present

## 2023-05-16 DIAGNOSIS — R4182 Altered mental status, unspecified: Secondary | ICD-10-CM | POA: Insufficient documentation

## 2023-05-16 DIAGNOSIS — H1131 Conjunctival hemorrhage, right eye: Secondary | ICD-10-CM | POA: Diagnosis not present

## 2023-05-16 DIAGNOSIS — R739 Hyperglycemia, unspecified: Secondary | ICD-10-CM | POA: Diagnosis not present

## 2023-05-16 DIAGNOSIS — N189 Chronic kidney disease, unspecified: Secondary | ICD-10-CM

## 2023-05-16 LAB — HEMOGLOBIN A1C
Hgb A1c MFr Bld: 6.6 % — ABNORMAL HIGH (ref 4.8–5.6)
Mean Plasma Glucose: 142.72 mg/dL

## 2023-05-16 LAB — COMPREHENSIVE METABOLIC PANEL
ALT: 12 U/L (ref 0–44)
AST: 21 U/L (ref 15–41)
Albumin: 3.2 g/dL — ABNORMAL LOW (ref 3.5–5.0)
Alkaline Phosphatase: 145 U/L — ABNORMAL HIGH (ref 38–126)
Anion gap: 13 (ref 5–15)
BUN: 31 mg/dL — ABNORMAL HIGH (ref 8–23)
CO2: 25 mmol/L (ref 22–32)
Calcium: 9.9 mg/dL (ref 8.9–10.3)
Chloride: 98 mmol/L (ref 98–111)
Creatinine, Ser: 2.19 mg/dL — ABNORMAL HIGH (ref 0.44–1.00)
GFR, Estimated: 22 mL/min — ABNORMAL LOW (ref >60)
Glucose, Bld: 124 mg/dL — ABNORMAL HIGH (ref 70–99)
Potassium: 3.8 mmol/L (ref 3.5–5.1)
Sodium: 136 mmol/L (ref 135–145)
Total Bilirubin: 1.6 mg/dL — ABNORMAL HIGH (ref 0.3–1.2)
Total Protein: 6.3 g/dL — ABNORMAL LOW (ref 6.5–8.1)

## 2023-05-16 LAB — CBC
HCT: 33.4 % — ABNORMAL LOW (ref 36.0–46.0)
Hemoglobin: 10.5 g/dL — ABNORMAL LOW (ref 12.0–15.0)
MCH: 30 pg (ref 26.0–34.0)
MCHC: 31.4 g/dL (ref 30.0–36.0)
MCV: 95.4 fL (ref 80.0–100.0)
Platelets: 123 10*3/uL — ABNORMAL LOW (ref 150–400)
RBC: 3.5 MIL/uL — ABNORMAL LOW (ref 3.87–5.11)
RDW: 14.5 % (ref 11.5–15.5)
WBC: 14.6 10*3/uL — ABNORMAL HIGH (ref 4.0–10.5)
nRBC: 0 % (ref 0.0–0.2)

## 2023-05-16 LAB — TSH: TSH: 1.578 u[IU]/mL (ref 0.350–4.500)

## 2023-05-16 LAB — CBG MONITORING, ED
Glucose-Capillary: 107 mg/dL — ABNORMAL HIGH (ref 70–99)
Glucose-Capillary: 122 mg/dL — ABNORMAL HIGH (ref 70–99)
Glucose-Capillary: 122 mg/dL — ABNORMAL HIGH (ref 70–99)

## 2023-05-16 LAB — VITAMIN B12: Vitamin B-12: 370 pg/mL (ref 180–914)

## 2023-05-16 LAB — GLUCOSE, CAPILLARY
Glucose-Capillary: 108 mg/dL — ABNORMAL HIGH (ref 70–99)
Glucose-Capillary: 150 mg/dL — ABNORMAL HIGH (ref 70–99)
Glucose-Capillary: 140 mg/dL — ABNORMAL HIGH (ref 70–99)

## 2023-05-16 LAB — BRAIN NATRIURETIC PEPTIDE: B Natriuretic Peptide: 578.9 pg/mL — ABNORMAL HIGH (ref 0.0–100.0)

## 2023-05-16 LAB — PROCALCITONIN: Procalcitonin: 0.2 ng/mL

## 2023-05-16 LAB — AMMONIA: Ammonia: 31 umol/L (ref 9–35)

## 2023-05-16 LAB — PROTIME-INR
INR: 3.5 — ABNORMAL HIGH (ref 0.8–1.2)
INR: 3.7 — ABNORMAL HIGH (ref 0.8–1.2)
Prothrombin Time: 35.1 s — ABNORMAL HIGH (ref 11.4–15.2)
Prothrombin Time: 36.8 s — ABNORMAL HIGH (ref 11.4–15.2)

## 2023-05-16 LAB — TROPONIN I (HIGH SENSITIVITY)
Troponin I (High Sensitivity): 38 ng/L — ABNORMAL HIGH (ref <18)
Troponin I (High Sensitivity): 29 ng/L — ABNORMAL HIGH (ref <18)

## 2023-05-16 LAB — VITAMIN D 25 HYDROXY (VIT D DEFICIENCY, FRACTURES): Vit D, 25-Hydroxy: 43.16 ng/mL (ref 30–100)

## 2023-05-16 MED ORDER — MODAFINIL 100 MG PO TABS
100.0000 mg | ORAL_TABLET | Freq: Every day | ORAL | Status: DC
  Administered 2023-05-16 – 2023-05-18 (×3): 100 mg via ORAL
  Filled 2023-05-16 (×3): qty 1

## 2023-05-16 MED ORDER — FERROUS SULFATE 325 (65 FE) MG PO TABS
325.0000 mg | ORAL_TABLET | Freq: Two times a day (BID) | ORAL | Status: DC
  Administered 2023-05-16 – 2023-05-18 (×4): 325 mg via ORAL
  Filled 2023-05-16 (×4): qty 1

## 2023-05-16 MED ORDER — WARFARIN - PHARMACIST DOSING INPATIENT: Freq: Every day | Status: DC

## 2023-05-16 MED ORDER — LINACLOTIDE 145 MCG PO CAPS
145.0000 ug | ORAL_CAPSULE | Freq: Every day | ORAL | Status: DC
  Administered 2023-05-17 – 2023-05-18 (×2): 145 ug via ORAL
  Filled 2023-05-16 (×2): qty 1

## 2023-05-16 MED ORDER — FAMOTIDINE 20 MG PO TABS
40.0000 mg | ORAL_TABLET | Freq: Every day | ORAL | Status: DC
  Administered 2023-05-16 – 2023-05-17 (×2): 40 mg via ORAL
  Filled 2023-05-16 (×2): qty 2

## 2023-05-16 MED ORDER — FLUTICASONE FUROATE-VILANTEROL 100-25 MCG/ACT IN AEPB
1.0000 | INHALATION_SPRAY | Freq: Every day | RESPIRATORY_TRACT | Status: DC
  Administered 2023-05-17 – 2023-05-18 (×2): 1 via RESPIRATORY_TRACT
  Filled 2023-05-16: qty 28

## 2023-05-16 NOTE — ED Notes (Signed)
Family updated as to patient's status. Message sent to admitting doctor that family would like to speak with him.

## 2023-05-16 NOTE — Hospital Course (Addendum)
Taken from H&P.  Holly Simmons is a 82 y.o. female with medical history significant of CKD stage IIIb, chronic HFpEF, hypertrophic cardiomyopathy status post myomectomy in 2012, CHB status post PPM, bioprosthetic TV, atrial fibrillation on Coumadin, hyperlipidemia, dementia, COPD presented to the ED as code stroke from urgent care for evaluation of confusion and acute onset left-sided weakness.  Last known well at 1455 on the day of admission.   CT head negative for acute abnormality. Seen by neurology and TNKase not given as symptoms had resolved and chronically anticoagulated with Coumadin.   Per neurology her presentation is more consistent with toxic metabolic encephalopathy and fatigue in the setting of advanced dementia.  Labs with leukocytosis at 12.3, glucose 239, bicarb 30, BUN 32, creatinine 2.4 with baseline around 1.7-1.8, calcium 10.9, alkaline phosphatase 176, T. bili 1.5, INR 3.1, COVID-19 PCR negative, UA not suggestive of infection. CXR with low lung volumes but no active disease. MRI brain was ordered but cannot be done until Monday due to her history of pacemaker.  10/12: Vital stable.  Concern of worsening confusion overnight so repeat CT head was done which was negative for any acute abnormality.  Labs with A1c of 6.6, slight worsening of leukocytosis at 14.6, platelets decreased 123, INR 3.5, B12 370 with goal should be more than 400, TSH and ammonia levels normal, vitamin D 25-hydroxy normal at 43, BNP elevated at 578.  Some improvement of creatinine to 2.19.  Troponin 38.  EKG with paced rhythm, ST depression but that seems chronic, worsening responsiveness and lethargy.  Per son she is normally a light sleeper.  No recent illnesses or upper respiratory symptoms, procalcitonin at 0.20. Message sent to neurology to reevaluate. EEG also ordered  10/13: Vital stable.  EEG with mild to moderate diffuse encephalopathy.  No seizure or epileptiform discharges were recorded.   Neurology thinks this is due to advancing dementia as patient has an history of slow decline over the past few years.  MRI still pending, even if she was found to have a small stroke she is on the maximum therapy with Coumadin and no further recommendations from neurology.  INR started improving, at 2.9 today PT/ OT recommending home health. Developed significant erythema and periorbital edema of right eye-consulted ophthalmology and they will see her, talk with Dr.N. Allena Katz.

## 2023-05-16 NOTE — Assessment & Plan Note (Addendum)
Acute onset left-sided weakness. CT head negative for acute abnormality. Seen by neurology and TNKase not given as symptoms had resolved and chronically anticoagulated with Coumadin. Neurology canceled code stroke and felt that her presentation was more consistent with toxic metabolic encephalopathy and fatigue in the setting of dementia.   Neurology thinks that she was having decreased clearance of Wellbutrin causing more somnolent due to AKI and recommending holding Wellbutrin for 3 days. TIA remained in differential.  Left-sided weakness has been improved but but patient remained very somnolent.  MRI pending but unfortunately cannot be done before Monday as patient has pacemaker in place. EEG was also ordered-pending. So far infectious workup negative.  -Holding home Lamictal and Wellbutrin -Avoid sedating medications -Continue to monitor -Supportive care

## 2023-05-16 NOTE — Progress Notes (Signed)
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  She was evaluated for stroke, and initial head CT was found to be negative and patient's exam at that time was nonfocal.  She was noted to be somewhat drowsy at the time, and we are consulted again today for persistent drowsiness.  Discussed patient's family's concerns, and they stated that they have noted a gradual decline in patient's cognitive abilities over the past few years.  This is consistent with her history of dementia.  Subjective: Patient rests with eyes closed but opens eyes and responds appropriately to voice and touch.  She is alert and oriented to person place and situation, somewhat oriented to time but gets month wrong.  Exam: Vitals:   05/16/23 1030 05/16/23 1100  BP: (!) 144/58 (!) 126/55  Pulse: (!) 43 67  Resp: (!) 21 15  Temp:    SpO2: (!) 88% 98%   Gen: In bed, NAD Resp: non-labored breathing, no acute distress Abd: soft, nt  Neuro: Mental Status: Patient rests with eyes closed but opens them to voice and touch, responds appropriately, oriented to person place disoriented to time but able to state that her children brought her to the hospital yesterday because they were concerned about her.  She is able to do simple addition but not subtraction.  She can name objects, and speech is clear and fluent Cranial Nerves: Pupils equal round and reactive, extraocular movements intact, face symmetrical, hearing intact to voice, phonation normal, shoulder shrug symmetrical, tongue midline Motor: Able to move all 4 extremities with good antigravity strength Sensory: Intact to light touch throughout Gait: Deferred  Pertinent Labs:    Latest Ref Rng & Units 05/16/2023    1:03 AM  05/15/2023    5:48 PM 05/15/2023    5:42 PM  CBC  WBC 4.0 - 10.5 K/uL 14.6   12.3   Hemoglobin 12.0 - 15.0 g/dL 16.1  09.6  04.5   Hematocrit 36.0 - 46.0 % 33.4  38.0  35.8   Platelets 150 - 400 K/uL 123   173        Latest Ref Rng & Units 05/16/2023   12:20 AM 05/15/2023    6:28 PM 05/15/2023    5:48 PM  BMP  Glucose 70 - 99 mg/dL 409  811  914   BUN 8 - 23 mg/dL 31  32  51   Creatinine 0.44 - 1.00 mg/dL 7.82  9.56  2.13   Sodium 135 - 145 mmol/L 136  138  135   Potassium 3.5 - 5.1 mmol/L 3.8  4.0  5.7   Chloride 98 - 111 mmol/L 98  94  95   CO2 22 - 32 mmol/L 25  30    Calcium 8.9 - 10.3 mg/dL 9.9  08.6       Imaging Reviewed:  CT head: No acute abnormality  MRI brain: Pending  EEG: Pending  Assessment: 82 year old patient with history of hypertension, hyperlipidemia, pacemaker, 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  We were reconsulted today for persistent drowsiness.  However, on exam patient is somewhat drowsy but awakens and responds appropriately when her name is called.  She is able to converse and consider that her children brought her to the hospital yesterday  because they were worried about her.  Family states they were concerned because of her drowsiness, worried about the possibility of a stroke and that they have noticed a steady cognitive decline over the past few years.  On arrival, patient's creatinine was noted to be elevated, and her tongue was dry, suggesting dehydration and AKI.  She is on bupropion at home, and this could have accumulated, causing some drowsiness.  Recommend holding bupropion for a few days as AKI resolves  Impression: Toxic metabolic encephalopathy in the setting of AKI with bupropion accumulation  Recommendations: -MRI brain on Monday is reasonable -Awaiting EEG reading -Recommend  holding bupropion for a few days as AKI resolves -Workup and treatment of other causes of toxic metabolic encephalopathy per primary team  Cortney E Ernestina Columbia , MSN, AGACNP-BC [facility] See Amion for schedule and pager information 05/16/2023 12:15 PM   NEUROHOSPITALIST ADDENDUM Performed a face to face diagnostic evaluation.   I have reviewed the contents of history and physical exam as documented by PA/ARNP/Resident and agree with above documentation.  I have discussed and formulated the above plan as documented. Edits to the note have been made as needed.  Impression/Key exam findings/Plan: somnolent but when waken up, follows commands and answers questions. I suspect that AKI led to reduced wellbutrin clearance. Reasonable to get rEEG and MRI. However, even if she has a stroke, with the fact that she is on warfarin, she is on maximal medical management and new stroke unlikely to change management.  Plan discussed with Dr. Nelson Chimes over secure chat. We do not plan to see her but will follow up on MRI Brain but this wont happen until Monday due to PPM.  Erick Blinks, MD [facility] [phone]   If 7pm to 7am, please call on call as listed on AMION.

## 2023-05-16 NOTE — Assessment & Plan Note (Signed)
Clinically appears dry, BNP at 578. Patient was on torsemide at home which is currently being held. -Monitor volume status closely as patient is getting some IV fluid

## 2023-05-16 NOTE — Procedures (Signed)
Patient Name: Holly Simmons  MRN: [id number]  Epilepsy Attending: Charlsie Quest  Referring Physician/Provider: Arnetha Courser, MD  Date: 05/16/2023 Duration: 24.42 mins  Patient history: 82yo F with acute onset of ams and left sided weakness. EEG to evaluate for seizure  Level of alertness: Awake, asleep  AEDs during EEG study: None  Technical aspects: This EEG study was done with scalp electrodes positioned according to the 10-20 International system of electrode placement. Electrical activity was reviewed with band pass filter of 1-70Hz , sensitivity of 7 uV/mm, display speed of 10mm/sec with a 60Hz  notched filter applied as appropriate. EEG data were recorded continuously and digitally stored.  Video monitoring was available and reviewed as appropriate.  Description: The posterior dominant rhythm consists of 7 Hz activity of moderate voltage (25-35 uV) seen predominantly in posterior head regions, symmetric and reactive to eye opening and eye closing. Sleep was characterized by vertex waves, sleep spindles (12 to 14 Hz), maximal frontocentral region. EEG showed continuous generalized  3 to 6 Hz theta-delta slowing. Physiologic photic driving was not seen during photic stimulation.  Hyperventilation was not performed.     ABNORMALITY - Continuous slow, generalized  IMPRESSION: This study is suggestive of mild to moderate diffuse encephalopathy. No seizures or epileptiform discharges were seen throughout the recording.  [name] [name]

## 2023-05-16 NOTE — Progress Notes (Addendum)
ANTICOAGULATION CONSULT NOTE - Initial Consult  Pharmacy Consult for Warfarin Indication: atrial fibrillation  Allergies  Allergen Reactions   Petrolatum-Zinc Oxide Anaphylaxis, Rash and Swelling   Sulfa Antibiotics Rash   Sulfonamide Derivatives Anaphylaxis and Swelling   Tetanus Toxoid     Other reaction(s): Other (See Comments) OTHER REACTION   Tetanus Toxoids Swelling   Other Rash and Other (See Comments)    STERI - STRIPS  It can affect proteins in her kidneys so she doesn't take REACTION: proteinuria Other reaction(s): Other (See Comments), Unknown It can affect proteins in her kidneys so she doesn't take   Nsaids Other (See Comments)    REACTION: proteinuria Other reaction(s): Other (See Comments), Unknown It can affect proteins in her kidneys so she doesn't take    Tetanus Toxoid, Adsorbed Other (See Comments)    Other reaction(s): Other (See Comments) Turned arm red Turned arm red    Patient Measurements: Height: 5\' 3"  (160 cm) Weight: 77.1 kg (170 lb) IBW/kg (Calculated) : 52.4 Vital Signs: Temp: 98.1 F (36.7 C) (10/12 0415) Temp Source: Oral (10/12 0415) BP: 124/50 (10/12 1000) Pulse Rate: 60 (10/12 1000)  Labs: Recent Labs    05/15/23 1742 05/15/23 1748 05/15/23 1828 05/16/23 0020 05/16/23 0040 05/16/23 0103 05/16/23 0818  HGB 11.2* 12.9  --   --   --  10.5*  --   HCT 35.8* 38.0  --   --   --  33.4*  --   PLT 173  --   --   --   --  123*  --   APTT 44*  --   --   --   --   --   --   LABPROT 32.2*  --   --   --  35.1*  --   --   INR 3.1*  --   --   --  3.5*  --   --   CREATININE  --  2.60* 2.47* 2.19*  --   --   --   TROPONINIHS  --   --   --   --   --   --  38*    Estimated Creatinine Clearance: 20.5 mL/min (A) (by C-G formula based on SCr of 2.19 mg/dL (H)).   Medical History: Past Medical History:  Diagnosis Date   Allergic rhinitis    Asthma    NOS w/ acute exacerbation   Blood transfusion without reported diagnosis    CKD  (chronic kidney disease) stage 3, GFR 30-59 ml/min (HCC) 09/12/2018   Colon polyps    TUBULAR ADENOMAS AND HYPERPLASTIC   Complete heart block (HCC)    requiring PPM (MDT) post surgical myomectomy at [facility],  leads are epicardial with abdominal implant, high ventricular threshold at implant   COPD (chronic obstructive pulmonary disease) (HCC)    Depressive disorder    Diastolic dysfunction    DM (diabetes mellitus) (HCC)    Gallstones    GERD (gastroesophageal reflux disease)    Gout 08/20/2012   Heart murmur    Hyperpotassemia    Hypersomnia    Hypertension    Hypertrophic cardiomyopathy (HCC)    s/p surgical myomectomy at [facility] 11/12 complicated by septal VSD post procedure requiring reoperation with patch closure and tricuspid valve replacement   Kidney stones    Myocardial infarction (HCC) 2011   Obstructive sleep apnea    persistent daytime sleepiness despite cpap   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   Psoriasis    Pyuria    Renal  insufficiency    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   Typical atrial flutter (HCC) 9/15    Medications:  (Not in a hospital admission)  Scheduled:   insulin aspart  0-5 Units Subcutaneous QHS   insulin aspart  0-6 Units Subcutaneous TID WC   Warfarin - Pharmacist Dosing Inpatient   Does not apply q1600   Infusions:  PRN: acetaminophen **OR** acetaminophen  Assessment: 38 yof with a history of CKD, HFpEF, hypertrophic cardiomyopathy s/p myomectomy (2012), CHB s/p PPM, bioprosthetic TV, AF, HLD, dementia, COPD. Patient is presenting with AMS. Warfarin per pharmacy consult placed for atrial fibrillation.  CT Head imaging obtained x 2 for stroke work-up without acute processes MRI for same has been ordered  Last anticoagulation appointment 04/17/23 where INR ar goal 2.5. PTA dosing 1.25mg  on Mondays and 2.5 all other days (total weekly 16.25mg ). LD PTA 10/10 at 1730. Pharmacy consulted to dose warfarin.   10/11 INR 3.1, slightly  supratherapeutic No overt s/sx bleeding. Currently in AKI   INR 3.1 10/11 - warfarin dose held INR 3.5 10/12 (at ~ midnight - a few hours after the 10/11 INR from above)  PT / INR this AM is 36.8 / 3.7, which is supra-therapeutic Hgb 10.5; plt 123  Goal of Therapy:  INR Goal 2-3 Monitor platelets by anticoagulation protocol: Yes   Plan:  Continue to hold warfarin today -- no s/s of bleed per RN Monitor for s/s of hemorrhage, daily INR, CBC Watch for new DDIs  Delmar Landau, PharmD, BCPS 05/16/2023 10:46 AM ED Clinical Pharmacist -  [phone]

## 2023-05-16 NOTE — Assessment & Plan Note (Addendum)
Patient appears dry with decreased p.o. intake and continue to take torsemide creatinine started improving with IV fluid. -Holding home torsemide -Monitor renal function -Nephrotoxins

## 2023-05-16 NOTE — ED Notes (Signed)
Pt calling out to use restroom, placed on bedpan.

## 2023-05-16 NOTE — Assessment & Plan Note (Signed)
No concern of exacerbation. -Continue home bronchodilators

## 2023-05-16 NOTE — Progress Notes (Signed)
EEG complete - results pending.  MB/GMD to join after completion of previous pt.

## 2023-05-16 NOTE — ED Notes (Signed)
MRI will be Monday.  Admitting aware and CT order placed.

## 2023-05-16 NOTE — Progress Notes (Signed)
Progress Note   Patient: Holly Simmons ZOX:[id number] DOB: Jan 20, 1941 DOA: 05/15/2023     0 DOS: the patient was seen and examined on 05/16/2023   Brief hospital course: Taken from H&P.  Holly Simmons is a 82 y.o. female with medical history significant of CKD stage IIIb, chronic HFpEF, hypertrophic cardiomyopathy status post myomectomy in 2012, CHB status post PPM, bioprosthetic TV, atrial fibrillation on Coumadin, hyperlipidemia, dementia, COPD presented to the ED as code stroke from urgent care for evaluation of confusion and acute onset left-sided weakness.  Last known well at 1455 on the day of admission.   CT head negative for acute abnormality. Seen by neurology and TNKase not given as symptoms had resolved and chronically anticoagulated with Coumadin.   Per neurology her presentation is more consistent with toxic metabolic encephalopathy and fatigue in the setting of advanced dementia.  Labs with leukocytosis at 12.3, glucose 239, bicarb 30, BUN 32, creatinine 2.4 with baseline around 1.7-1.8, calcium 10.9, alkaline phosphatase 176, T. bili 1.5, INR 3.1, COVID-19 PCR negative, UA not suggestive of infection. CXR with low lung volumes but no active disease. MRI brain was ordered but cannot be done until Monday due to her history of pacemaker.  10/12: Vital stable.  Concern of worsening confusion overnight so repeat CT head was done which was negative for any acute abnormality.  Labs with A1c of 6.6, slight worsening of leukocytosis at 14.6, platelets decreased 123, INR 3.5, B12 370 with goal should be more than 400, TSH and ammonia levels normal, vitamin D 25-hydroxy normal at 43, BNP elevated at 578.  Some improvement of creatinine to 2.19.  Troponin 38.  EKG with paced rhythm, ST depression but that seems chronic, worsening responsiveness and lethargy.  Per son she is normally a light sleeper.  No recent illnesses or upper respiratory symptoms, procalcitonin at  0.20. Message sent to neurology to reevaluate. EEG also ordered      Assessment and Plan: * Altered mental status Acute onset left-sided weakness. CT head negative for acute abnormality. Seen by neurology and TNKase not given as symptoms had resolved and chronically anticoagulated with Coumadin. Neurology canceled code stroke and felt that her presentation was more consistent with toxic metabolic encephalopathy and fatigue in the setting of dementia.   Neurology thinks that she was having decreased clearance of Wellbutrin causing more somnolent due to AKI and recommending holding Wellbutrin for 3 days. TIA remained in differential.  Left-sided weakness has been improved but but patient remained very somnolent.  MRI pending but unfortunately cannot be done before Monday as patient has pacemaker in place. EEG was also ordered-pending. So far infectious workup negative.  -Holding home Lamictal and Wellbutrin -Avoid sedating medications -Continue to monitor -Supportive care   Acute kidney injury superimposed on chronic kidney disease (HCC) Patient appears dry with decreased p.o. intake and continue to take torsemide creatinine started improving with IV fluid. -Holding home torsemide -Monitor renal function -Nephrotoxins  Hypercalcemia Likely with dehydration as improved with IV fluid. Patient was also on calcium supplement at home Vitamin D levels normal, parathyroid pending -Hold calcium supplement -Continue to monitor  Hyperglycemia 1 episode of hyperglycemia with blood glucose above 200.  A1c of 6.6, it was 5.3 in August 2023. -Continue to monitor -Patient need to discuss with PCP and they can start her on on any medications if needed -Carb modified diet  COPD (chronic obstructive pulmonary disease) (HCC) No concern of exacerbation. -Continue home bronchodilators  A-fib (HCC) Borderline bradycardia. -Holding  home metoprolol -Coumadin per pharmacy-currently INR at  3.5  Chronic heart failure with preserved ejection fraction (HFpEF) (HCC) Clinically appears dry, BNP at 578. Patient was on torsemide at home which is currently being held. -Monitor volume status closely as patient is getting some IV fluid   Subjective: Patient was quite somnolent and difficult to wake her up when seen today.  Son at bedside.  According to him patient is normally a light sleeper and this is unusual for her. Patient with sudden onset left-sided weakness yesterday which seems improved.  No recent illnesses or respiratory symptoms  Physical Exam: Vitals:   05/16/23 0930 05/16/23 1000 05/16/23 1030 05/16/23 1100  BP: (!) 128/48 (!) 124/50 (!) 144/58 (!) 126/55  Pulse: (!) 59 60 (!) 43 67  Resp: (!) 21 20 (!) 21 15  Temp:      TempSrc:      SpO2: (!) 87% 94% (!) 88% 98%  Weight:      Height:       General.  Frail and somnolent elderly lady, in no acute distress. Pulmonary.  Lungs clear bilaterally, normal respiratory effort. CV.  Regular rate and rhythm, no JVD, rub or murmur. Abdomen.  Soft, nontender, nondistended, BS positive. CNS.  Somnolent, unable to to a neuro exam. Extremities.  No edema, no cyanosis, pulses intact and symmetrical.   Data Reviewed: Prior data reviewed  Family Communication: Cussed with son and DIL at bedside  Disposition: Status is: Observation The patient remains OBS appropriate and will d/c before 2 midnights.  Planned Discharge Destination: Home with Home Health  DVT prophylaxis.  Coumadin Time spent: 50 minutes  This record has been created using Conservation officer, historic buildings. Errors have been sought and corrected,but may not always be located. Such creation errors do not reflect on the standard of care.   Author: Arnetha Courser, MD 05/16/2023 1:07 PM  For on call review [email or web address].

## 2023-05-16 NOTE — Progress Notes (Addendum)
  Acute metabolic encephalopathy History of dementia with confusion Patient's nurse reporting that when they woke her up from the sleep she has more confusion and seems like that patient has left-sided hemineglect. - Per chart review patient has been admitted for confusion with left-sided weakness and initial workup include head CT ruled out a stroke and neurology recommended canceling code stroke workup and patient's confusion is toxic metabolic encephalopathy related and in the setting of dementia. -Given patient is more confused checking another head CT stat to make sure there is no new development of stroke. - MRI has been ordered but given patient has a pacemaker it will not be able to perform before Monday per MRI technician.  Tereasa Coop, MD [facility] 05/16/2023, 5:13 AM

## 2023-05-16 NOTE — Assessment & Plan Note (Signed)
1 episode of hyperglycemia with blood glucose above 200.  A1c of 6.6, it was 5.3 in August 2023. -Continue to monitor -Patient need to discuss with PCP and they can start her on on any medications if needed -Carb modified diet

## 2023-05-16 NOTE — Assessment & Plan Note (Signed)
Borderline bradycardia. -Holding home metoprolol -Coumadin per pharmacy-currently INR at 3.5

## 2023-05-16 NOTE — Assessment & Plan Note (Addendum)
Likely with dehydration as improved with IV fluid. Patient was also on calcium supplement at home Vitamin D levels normal, parathyroid pending -Hold calcium supplement -Continue to monitor

## 2023-05-16 NOTE — ED Notes (Signed)
Pt to CT

## 2023-05-16 NOTE — ED Notes (Signed)
ED TO INPATIENT HANDOFF REPORT  ED Nurse Name and Phone #:  Theophilus Bones [phone]  S Name/Age/Gender Holly Simmons 82 y.o. female Room/Bed: 040C/040C  Code Status   Code Status: Full Code  Home/SNF/Other Home Patient oriented to: self and place Is this baseline? No   Triage Complete: Triage complete  Chief Complaint Stroke-like symptoms [R29.90]  Triage Note Delayed entry due to direct patient care. Patient presents as code stroke via EMS from urgent care. Per EMS, at approximately 1545, patient was in the car with her son when she began having difficulty grasping her sunglasses with her left hand. Per EMS, once the patient was home, her son noted that she had a shuffling gait and appeared to be dragging her left leg. Per EMS, patient then went to urgent care where they noted a left arm drift that was resolved upon EMS arrival. Per EMS, patient has a history of dementia and last took her coumadin yesterday. Patient is lethargic upon arrival; requiring frequent stimuli via voice and touch to remain awake and answer questions. Patient transported to/from CT by RN.   Allergies Allergies  Allergen Reactions   Petrolatum-Zinc Oxide Anaphylaxis, Rash and Swelling   Sulfa Antibiotics Rash   Sulfonamide Derivatives Anaphylaxis and Swelling   Tetanus Toxoid     Other reaction(s): Other (See Comments) OTHER REACTION   Tetanus Toxoids Swelling   Other Rash and Other (See Comments)    STERI - STRIPS  It can affect proteins in her kidneys so she doesn't take REACTION: proteinuria Other reaction(s): Other (See Comments), Unknown It can affect proteins in her kidneys so she doesn't take   Nsaids Other (See Comments)    REACTION: proteinuria Other reaction(s): Other (See Comments), Unknown It can affect proteins in her kidneys so she doesn't take    Tetanus Toxoid, Adsorbed Other (See Comments)    Other reaction(s): Other (See Comments) Turned arm red Turned arm red    Level  of Care/Admitting Diagnosis ED Disposition     ED Disposition  Admit   Condition  --   Comment  Hospital Area: [facility] [[id number]]  Level of Care: Telemetry Medical [[id number]]  May place patient in observation at [facility] or [facility] if equivalent level of care is available:: Yes  Covid Evaluation: Asymptomatic - no recent exposure (last 10 days) testing not required  Diagnosis: Stroke-like symptoms [725552]  Admitting Physician: John Giovanni [[id number]]  Attending Physician: John Giovanni [[id number]]          B Medical/Surgery History Past Medical History:  Diagnosis Date   Allergic rhinitis    Asthma    NOS w/ acute exacerbation   Blood transfusion without reported diagnosis    CKD (chronic kidney disease) stage 3, GFR 30-59 ml/min (HCC) 09/12/2018   Colon polyps    TUBULAR ADENOMAS AND HYPERPLASTIC   Complete heart block (HCC)    requiring PPM (MDT) post surgical myomectomy at [facility],  leads are epicardial with abdominal implant, high ventricular threshold at implant   COPD (chronic obstructive pulmonary disease) (HCC)    Depressive disorder    Diastolic dysfunction    DM (diabetes mellitus) (HCC)    Gallstones    GERD (gastroesophageal reflux disease)    Gout 08/20/2012   Heart murmur    Hyperpotassemia    Hypersomnia    Hypertension    Hypertrophic cardiomyopathy (HCC)    s/p surgical myomectomy at [facility] 11/12 complicated by septal VSD post procedure requiring reoperation with patch closure  and tricuspid valve replacement   Kidney stones    Myocardial infarction [facility]) 2011   Obstructive sleep apnea    persistent daytime sleepiness despite cpap   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   Psoriasis    Pyuria    Renal insufficiency    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   Typical atrial flutter (HCC) 9/15   Past Surgical History:  Procedure Laterality Date   ABDOMINAL HYSTERECTOMY     APPENDECTOMY     BREAST CYST EXCISION      CARDIOVERSION N/A 08/01/2014   Procedure: CARDIOVERSION;  Surgeon: Vesta Mixer, MD;  Location: [facility] ENDOSCOPY;  Service: Cardiovascular;  Laterality: N/A;   CESAREAN SECTION     CHOLECYSTECTOMY     COLONOSCOPY     EYE SURGERY  01/16/2016   left eye lens implant   EYE SURGERY  01/09/2016   right eye lens implant   INSERT / REPLACE / REMOVE PACEMAKER  07/13/2012   PACEMAKER INSERTION  07/02/2011   epicardial wires with abdominal implant at [facility] 12/12,  high ventricular lead threshold at implant per Dr Christin Fudge   PERMANENT PACEMAKER INSERTION N/A 07/13/2012   Procedure: PERMANENT PACEMAKER INSERTION;  Surgeon: Hillis Range, MD;  Location: [facility] CATH LAB;  Service: Cardiovascular;  Laterality: N/A;   POLYPECTOMY     septal myomectomy for hypertrophic CM  06/23/2011   by Dr Silvestre Mesi at [facility], complicated by septal VSD requiring patch repair and tricuspid valve replacement   TONSILLECTOMY     TOOTH EXTRACTION     TRICUSPID VALVE REPLACEMENT  06/2011   Medtronic 27mm Mosaic tissue valve   VSD REPAIR  06/2011     A IV Location/Drains/Wounds Patient Lines/Drains/Airways Status     Active Line/Drains/Airways     Name Placement date Placement time Site Days   Peripheral IV 05/15/23 18 G Right Antecubital 05/15/23  --  Antecubital  1            Intake/Output Last 24 hours No intake or output data in the 24 hours ending 05/16/23 1226  Labs/Imaging Results for orders placed or performed during the hospital encounter of 05/15/23 (from the past 48 hour(s))  Protime-INR     Status: Abnormal   Collection Time: 05/15/23  5:42 PM  Result Value Ref Range   Prothrombin Time 32.2 (H) 11.4 - 15.2 seconds   INR 3.1 (H) 0.8 - 1.2    Comment: (NOTE) INR goal varies based on device and disease states. Performed at [facility] Lab, [address], [place], [place] [address]   APTT     Status: Abnormal   Collection Time: 05/15/23  5:42 PM  Result Value Ref Range   aPTT 44 (H) 24 - 36 seconds     Comment:        IF BASELINE aPTT IS ELEVATED, SUGGEST PATIENT RISK ASSESSMENT BE USED TO DETERMINE APPROPRIATE ANTICOAGULANT THERAPY. Performed at [facility], [address], [place], [place] [address]   CBC     Status: Abnormal   Collection Time: 05/15/23  5:42 PM  Result Value Ref Range   WBC 12.3 (H) 4.0 - 10.5 K/uL   RBC 3.71 (L) 3.87 - 5.11 MIL/uL   Hemoglobin 11.2 (L) 12.0 - 15.0 g/dL   HCT 91.4 (L) 78.2 - 95.6 %   MCV 96.5 80.0 - 100.0 fL   MCH 30.2 26.0 - 34.0 pg   MCHC 31.3 30.0 - 36.0 g/dL   RDW 21.3 08.6 -  15.5 %   Platelets 173 150 - 400 K/uL   nRBC 0.0 0.0 - 0.2 %    Comment: Performed at [facility] Lab, [address] [place], [place] [address]  Differential     Status: Abnormal   Collection Time: 05/15/23  5:42 PM  Result Value Ref Range   Neutrophils Relative % 85 %   Neutro Abs 10.5 (H) 1.7 - 7.7 K/uL   Lymphocytes Relative 4 %   Lymphs Abs 0.4 (L) 0.7 - 4.0 K/uL   Monocytes Relative 9 %   Monocytes Absolute 1.2 (H) 0.1 - 1.0 K/uL   Eosinophils Relative 1 %   Eosinophils Absolute 0.1 0.0 - 0.5 K/uL   Basophils Relative 0 %   Basophils Absolute 0.0 0.0 - 0.1 K/uL   Immature Granulocytes 1 %   Abs Immature Granulocytes 0.07 0.00 - 0.07 K/uL    Comment: Performed at [facility] Lab, [address], [place], [place] [address]  Ethanol     Status: None   Collection Time: 05/15/23  5:42 PM  Result Value Ref Range   Alcohol, Ethyl (B) <10 <10 mg/dL    Comment: (NOTE) Lowest detectable limit for serum alcohol is 10 mg/dL.  For medical purposes only. Performed at [facility], [address] [place], [place] [address]   CBG monitoring, ED     Status: Abnormal   Collection Time: 05/15/23  5:42 PM  Result Value Ref Range   Glucose-Capillary 255 (H) 70 - 99 mg/dL    Comment: Glucose reference range applies only to samples taken after fasting for at least 8 hours.  I-stat chem 8, ED     Status: Abnormal   Collection Time: 05/15/23   5:48 PM  Result Value Ref Range   Sodium 135 135 - 145 mmol/L   Potassium 5.7 (H) 3.5 - 5.1 mmol/L   Chloride 95 (L) 98 - 111 mmol/L   BUN 51 (H) 8 - 23 mg/dL   Creatinine, Ser 9.14 (H) 0.44 - 1.00 mg/dL   Glucose, Bld 782 (H) 70 - 99 mg/dL    Comment: Glucose reference range applies only to samples taken after fasting for at least 8 hours.   Calcium, Ion 1.14 (L) 1.15 - 1.40 mmol/L   TCO2 31 22 - 32 mmol/L   Hemoglobin 12.9 12.0 - 15.0 g/dL   HCT 95.6 21.3 - 08.6 %  Comprehensive metabolic panel     Status: Abnormal   Collection Time: 05/15/23  6:28 PM  Result Value Ref Range   Sodium 138 135 - 145 mmol/L   Potassium 4.0 3.5 - 5.1 mmol/L   Chloride 94 (L) 98 - 111 mmol/L   CO2 30 22 - 32 mmol/L   Glucose, Bld 239 (H) 70 - 99 mg/dL    Comment: Glucose reference range applies only to samples taken after fasting for at least 8 hours.   BUN 32 (H) 8 - 23 mg/dL   Creatinine, Ser 5.78 (H) 0.44 - 1.00 mg/dL   Calcium 46.9 (H) 8.9 - 10.3 mg/dL   Total Protein 7.3 6.5 - 8.1 g/dL   Albumin 3.8 3.5 - 5.0 g/dL   AST 21 15 - 41 U/L   ALT 14 0 - 44 U/L   Alkaline Phosphatase 176 (H) 38 - 126 U/L   Total Bilirubin 1.5 (H) 0.3 - 1.2 mg/dL   GFR, Estimated 19 (L) >60 mL/min    Comment: (NOTE) Calculated using the CKD-EPI Creatinine Equation (2021)  Anion gap 14 5 - 15    Comment: Performed at [facility] Lab, [address], [place], [place] [address]  SARS Coronavirus 2 by RT PCR (hospital order, performed in [facility] hospital lab) *cepheid single result test* Anterior Nasal Swab     Status: None   Collection Time: 05/15/23  7:35 PM   Specimen: Anterior Nasal Swab  Result Value Ref Range   SARS Coronavirus 2 by RT PCR NEGATIVE NEGATIVE    Comment: Performed at [facility] Lab, [address] [place], [place] [address]  Urinalysis, w/ Reflex to Culture (Infection Suspected) -Urine, Catheterized     Status: Abnormal   Collection Time: 05/15/23  7:35 PM  Result Value Ref Range    Specimen Source URINE, CATHETERIZED    Color, Urine YELLOW YELLOW   APPearance CLEAR CLEAR   Specific Gravity, Urine 1.009 1.005 - 1.030   pH 5.0 5.0 - 8.0   Glucose, UA 50 (A) NEGATIVE mg/dL   Hgb urine dipstick NEGATIVE NEGATIVE   Bilirubin Urine NEGATIVE NEGATIVE   Ketones, ur NEGATIVE NEGATIVE mg/dL   Protein, ur 30 (A) NEGATIVE mg/dL   Nitrite NEGATIVE NEGATIVE   Leukocytes,Ua NEGATIVE NEGATIVE   RBC / HPF 0-5 0 - 5 RBC/hpf   WBC, UA 0-5 0 - 5 WBC/hpf    Comment:        Reflex urine culture not performed if WBC <=10, OR if Squamous epithelial cells >5. If Squamous epithelial cells >5 suggest recollection.    Bacteria, UA RARE (A) NONE SEEN   Squamous Epithelial / HPF 0-5 0 - 5 /HPF   Mucus PRESENT    Hyaline Casts, UA PRESENT     Comment: Performed at [facility] Lab, [address] [place], [place] [address]  CBG monitoring, ED     Status: Abnormal   Collection Time: 05/15/23  8:46 PM  Result Value Ref Range   Glucose-Capillary 152 (H) 70 - 99 mg/dL    Comment: Glucose reference range applies only to samples taken after fasting for at least 8 hours.  Comprehensive metabolic panel     Status: Abnormal   Collection Time: 05/16/23 12:20 AM  Result Value Ref Range   Sodium 136 135 - 145 mmol/L   Potassium 3.8 3.5 - 5.1 mmol/L   Chloride 98 98 - 111 mmol/L   CO2 25 22 - 32 mmol/L   Glucose, Bld 124 (H) 70 - 99 mg/dL    Comment: Glucose reference range applies only to samples taken after fasting for at least 8 hours.   BUN 31 (H) 8 - 23 mg/dL   Creatinine, Ser 9.14 (H) 0.44 - 1.00 mg/dL   Calcium 9.9 8.9 - 78.2 mg/dL   Total Protein 6.3 (L) 6.5 - 8.1 g/dL   Albumin 3.2 (L) 3.5 - 5.0 g/dL   AST 21 15 - 41 U/L   ALT 12 0 - 44 U/L   Alkaline Phosphatase 145 (H) 38 - 126 U/L   Total Bilirubin 1.6 (H) 0.3 - 1.2 mg/dL   GFR, Estimated 22 (L) >60 mL/min    Comment: (NOTE) Calculated using the CKD-EPI Creatinine Equation (2021)    Anion gap 13 5 - 15    Comment:  Performed at [facility], [address], [place], [place] [address]  Brain natriuretic peptide     Status: Abnormal   Collection Time: 05/16/23 12:20 AM  Result Value Ref Range   B Natriuretic Peptide 578.9 (H) 0.0 -  100.0 pg/mL    Comment: Performed at [facility], [address], [place], [place] [address]  VITAMIN D 25 Hydroxy (Vit-D Deficiency, Fractures)     Status: None   Collection Time: 05/16/23 12:20 AM  Result Value Ref Range   Vit D, 25-Hydroxy 43.16 30 - 100 ng/mL    Comment: (NOTE) Vitamin D deficiency has been defined by the Institute of Medicine  and an Endocrine Society practice guideline as a level of serum 25-OH  vitamin D less than 20 ng/mL (1,2). The Endocrine Society went on to  further define vitamin D insufficiency as a level between 21 and 29  ng/mL (2).  1. IOM (Institute of Medicine). 2010. Dietary reference intakes for  calcium and D. [place]: [employer]. 2. Holick MF, Binkley [place], Bischoff-Ferrari HA, et al. Evaluation,  treatment, and prevention of vitamin D deficiency: an Endocrine  Society clinical practice guideline, JCEM. 2011 Jul; 96(7): 1911-30.  Performed at [facility] Lab, [address] [place], [place] [address]   Ammonia     Status: None   Collection Time: 05/16/23 12:21 AM  Result Value Ref Range   Ammonia 31 9 - 35 umol/L    Comment: Performed at [facility] Lab, [address], [place], [place] [address]  TSH     Status: None   Collection Time: 05/16/23 12:23 AM  Result Value Ref Range   TSH 1.578 0.350 - 4.500 uIU/mL    Comment: Performed by a 3rd Generation assay with a functional sensitivity of <=0.01 uIU/mL. Performed at [facility] Lab, [address], [place], [place] [address]   Vitamin B12     Status: None   Collection Time: 05/16/23 12:23 AM  Result Value Ref Range   Vitamin B-12 370 180 - 914 pg/mL    Comment: (NOTE) This assay is not validated for testing neonatal  or myeloproliferative syndrome specimens for Vitamin B12 levels. Performed at [facility] Lab, [address], [place], [place] [address]   CBG monitoring, ED     Status: Abnormal   Collection Time: 05/16/23 12:38 AM  Result Value Ref Range   Glucose-Capillary 122 (H) 70 - 99 mg/dL    Comment: Glucose reference range applies only to samples taken after fasting for at least 8 hours.  Protime-INR     Status: Abnormal   Collection Time: 05/16/23 12:40 AM  Result Value Ref Range   Prothrombin Time 35.1 (H) 11.4 - 15.2 seconds   INR 3.5 (H) 0.8 - 1.2    Comment: (NOTE) INR goal varies based on device and disease states. Performed at [facility] Lab, [address], [place], [place] [address]   CBC     Status: Abnormal   Collection Time: 05/16/23  1:03 AM  Result Value Ref Range   WBC 14.6 (H) 4.0 - 10.5 K/uL   RBC 3.50 (L) 3.87 - 5.11 MIL/uL   Hemoglobin 10.5 (L) 12.0 - 15.0 g/dL   HCT 36.6 (L) 44.0 - 34.7 %   MCV 95.4 80.0 - 100.0 fL   MCH 30.0 26.0 - 34.0 pg   MCHC 31.4 30.0 - 36.0 g/dL   RDW 42.5 95.6 - 38.7 %   Platelets 123 (L) 150 - 400 K/uL   nRBC 0.0 0.0 - 0.2 %    Comment: Performed at [facility] Lab, [address], [place], [place] [address]  Hemoglobin A1c     Status: Abnormal  Collection Time: 05/16/23  1:03 AM  Result Value Ref Range   Hgb A1c MFr Bld 6.6 (H) 4.8 - 5.6 %    Comment: (NOTE) Pre diabetes:          5.7%-6.4%  Diabetes:              >6.4%  Glycemic control for   <7.0% adults with diabetes    Mean Plasma Glucose 142.72 mg/dL    Comment: Performed at [facility] Lab, [address], [place], [place] [address]  Troponin I (High Sensitivity)     Status: Abnormal   Collection Time: 05/16/23  8:18 AM  Result Value Ref Range   Troponin I (High Sensitivity) 38 (H) <18 ng/L    Comment: (NOTE) Elevated high sensitivity troponin I (hsTnI) values and significant  changes across serial measurements may suggest ACS but many other  chronic  and acute conditions are known to elevate hsTnI results.  Refer to the "Links" section for chest pain algorithms and additional  guidance. Performed at [facility] Lab, [address] [place], [place] [address]   Procalcitonin     Status: None   Collection Time: 05/16/23  8:18 AM  Result Value Ref Range   Procalcitonin 0.20 ng/mL    Comment:        Interpretation: PCT (Procalcitonin) <= 0.5 ng/mL: Systemic infection (sepsis) is not likely. Local bacterial infection is possible. (NOTE)       Sepsis PCT Algorithm           Lower Respiratory Tract                                      Infection PCT Algorithm    ----------------------------     ----------------------------         PCT < 0.25 ng/mL                PCT < 0.10 ng/mL          Strongly encourage             Strongly discourage   discontinuation of antibiotics    initiation of antibiotics    ----------------------------     -----------------------------       PCT 0.25 - 0.50 ng/mL            PCT 0.10 - 0.25 ng/mL               OR       >80% decrease in PCT            Discourage initiation of                                            antibiotics      Encourage discontinuation           of antibiotics    ----------------------------     -----------------------------         PCT >= 0.50 ng/mL              PCT 0.26 - 0.50 ng/mL               AND        <80% decrease in PCT             Encourage initiation  of                                             antibiotics       Encourage continuation           of antibiotics    ----------------------------     -----------------------------        PCT >= 0.50 ng/mL                  PCT > 0.50 ng/mL               AND         increase in PCT                  Strongly encourage                                      initiation of antibiotics    Strongly encourage escalation           of antibiotics                                     -----------------------------                                            PCT <= 0.25 ng/mL                                                 OR                                        > 80% decrease in PCT                                      Discontinue / Do not initiate                                             antibiotics  Performed at [facility] Lab, [address], [place], [place] [address]   CBG monitoring, ED     Status: Abnormal   Collection Time: 05/16/23  8:19 AM  Result Value Ref Range   Glucose-Capillary 122 (H) 70 - 99 mg/dL    Comment: Glucose reference range applies only to samples taken after fasting for at least 8 hours.   Comment 1 Document in Chart   CBG monitoring, ED     Status: Abnormal   Collection Time: 05/16/23 11:56 AM  Result Value Ref Range   Glucose-Capillary 107 (H) 70 - 99 mg/dL    Comment: Glucose reference range applies only to samples taken after fasting for at least 8 hours.  Comment 1 Document in Chart    *Note: Due to a large number of results and/or encounters for the requested time period, some results have not been displayed. A complete set of results can be found in Results Review.   CT HEAD WO CONTRAST ( )  Result Date: 05/16/2023 CLINICAL DATA:  82 year old female dragging left leg, left side weakness. Code stroke presentation yesterday. Subsequent encounter. EXAM: CT HEAD WITHOUT CONTRAST TECHNIQUE: Contiguous axial images were obtained from the base of the skull through the vertex without intravenous contrast. RADIATION DOSE REDUCTION: This exam was performed according to the departmental dose-optimization program which includes automated exposure control, adjustment of the mA and/or kV according to patient size and/or use of iterative reconstruction technique. COMPARISON:  Head CT 05/15/2023. FINDINGS: Brain: Stable non contrast CT appearance of the brain. Normal cerebral volume for age. Patchy bilateral white matter hypodensity. No midline shift, ventriculomegaly, mass effect,  evidence of mass lesion, intracranial hemorrhage or evidence of cortically based acute infarction. Vascular: Resolved small volume intravenous gas seen yesterday, likely was IV access related. Calcified atherosclerosis at the skull base. No suspicious intracranial vascular hyperdensity. Skull: No acute osseous abnormality identified. Sinuses/Orbits: Mild paranasal sinus mucosal thickening is stable. Tympanic cavities and mastoids appear clear. Other: No acute orbit or scalp soft tissue finding. IMPRESSION: Stable non contrast CT appearance of cerebral white matter disease. No acute intracranial abnormality. Electronically Signed   By: Odessa Fleming M.D.   On: 05/16/2023 07:18   DG Chest Portable 1 View  Result Date: 05/15/2023 CLINICAL DATA:  Cough EXAM: PORTABLE CHEST 1 VIEW COMPARISON:  Radiograph 07/01/2022 FINDINGS: Left chest wall CRT-P. Sternotomy and CABG. Epicardial pacing wires. Stable cardiomediastinal silhouette. Aortic atherosclerotic calcification. Low lung volumes accentuate pulmonary vascularity. No pleural effusion or pneumothorax. IMPRESSION: Low lung volumes.  No active disease. Electronically Signed   By: Minerva Fester M.D.   On: 05/15/2023 21:57   CT HEAD CODE STROKE WO CONTRAST  Result Date: 05/15/2023 CLINICAL DATA:  Code stroke. Neuro deficit, acute, stroke suspected. Left-sided weakness. EXAM: CT HEAD WITHOUT CONTRAST TECHNIQUE: Contiguous axial images were obtained from the base of the skull through the vertex without intravenous contrast. RADIATION DOSE REDUCTION: This exam was performed according to the departmental dose-optimization program which includes automated exposure control, adjustment of the mA and/or kV according to patient size and/or use of iterative reconstruction technique. COMPARISON:  Head CT 03/19/2022. FINDINGS: Brain: No acute hemorrhage. Unchanged moderate chronic small-vessel disease. Cortical gray-white differentiation is otherwise preserved. Prominence of the  ventricles and sulci within expected range for age. No hydrocephalus or extra-axial collection. No mass effect or midline shift. Vascular: No hyperdense vessel or unexpected calcification. Skull: No calvarial fracture or suspicious bone lesion. Skull base is unremarkable. Sinuses/Orbits: No acute finding. Other: None. ASPECTS [facility] Stroke Program Early CT Score) - Ganglionic level infarction (caudate, lentiform nuclei, internal capsule, insula, M1-M3 cortex): 7 - Supraganglionic infarction (M4-M6 cortex): 3 Total score (0-10 with 10 being normal): 10 IMPRESSION: 1. No acute intracranial hemorrhage or evidence of acute large vessel territory infarct. ASPECT score is 10. 2. Stable moderate chronic small-vessel disease. Electronically Signed   By: Orvan Falconer M.D.   On: 05/15/2023 18:00    Pending Labs Unresulted Labs (From admission, onward)     Start     Ordered   05/16/23 1048  Protime-INR  ONCE - STAT,   STAT        05/16/23 1047   05/16/23 0500  Protime-INR  Daily,  R      05/16/23 0032   05/16/23 0006  PTH, intact and calcium  Once,   R        05/16/23 0005            Vitals/Pain Today's Vitals   05/16/23 0930 05/16/23 1000 05/16/23 1030 05/16/23 1100  BP: (!) 128/48 (!) 124/50 (!) 144/58 (!) 126/55  Pulse: (!) 59 60 (!) 43 67  Resp: (!) 21 20 (!) 21 15  Temp:      TempSrc:      SpO2: (!) 87% 94% (!) 88% 98%  Weight:      Height:      PainSc:        Isolation Precautions No active isolations  Medications Medications  acetaminophen (TYLENOL) tablet 650 mg (has no administration in time range)    Or  acetaminophen (TYLENOL) suppository 650 mg (has no administration in time range)  insulin aspart (novoLOG) injection 0-6 Units ( Subcutaneous Not Given 05/16/23 1201)  insulin aspart (novoLOG) injection 0-5 Units ( Subcutaneous Not Given 05/16/23 0040)  Warfarin - Pharmacist Dosing Inpatient (has no administration in time range)  sodium chloride flush (NS) 0.9 %  injection 3 mL (3 mLs Intravenous Given 05/15/23 1811)  sodium chloride 0.9 % bolus 1,000 mL (0 mLs Intravenous Stopped 05/15/23 2136)    Mobility walks     Focused Assessments Neuro Assessment Handoff:  Swallow screen pass? Yes  Cardiac Rhythm: Sinus tachycardia NIH Stroke Scale  Dizziness Present: No Headache Present: No Interval:  (deferred, pt sleeping, unable to assess at this time) Level of Consciousness (1a.)   : Alert, keenly responsive LOC Questions (1b. )   : Answers one question correctly LOC Commands (1c. )   : Performs both tasks correctly Best Gaze (2. )  : Normal Visual (3. )  : No visual loss Facial Palsy (4. )    : Normal symmetrical movements Motor Arm, Left (5a. )   : No drift Motor Arm, Right (5b. ) : No drift Motor Leg, Left (6a. )  : No drift Motor Leg, Right (6b. ) : No drift Limb Ataxia (7. ): Absent Sensory (8. )  : Normal, no sensory loss Best Language (9. )  : No aphasia Dysarthria (10. ): Normal Extinction/Inattention (11.)   : No Abnormality Complete NIHSS TOTAL: 1 Last date known well: 05/15/23 Last time known well: 1545 Neuro Assessment: Exceptions to WDL Neuro Checks:   Initial (05/15/23 1745)  Has TPA been given? No If patient is a Neuro Trauma and patient is going to OR before floor call report to 4N Charge nurse: [phone] or [phone]   R Recommendations: See Admitting Provider Note  Report given to:   Additional Notes:

## 2023-05-16 NOTE — H&P (Incomplete)
History and Physical    Holly Simmons YQM:[id number] DOB: 1941-05-01 DOA: 05/15/2023  PCP: Etta Grandchild, MD  Patient coming from: Home  Chief Complaint: Left-sided weakness  HPI: Holly Simmons is a 82 y.o. female with medical history significant of CKD stage IIIb, chronic HFpEF, hypertrophic cardiomyopathy status post myomectomy in 2012, CHB status post PPM, bioprosthetic TV, atrial fibrillation on Coumadin, hyperlipidemia, dementia, COPD presented to the ED as code stroke from urgent care for evaluation of confusion and acute onset left-sided weakness.  Last known well at 1455 today.  CT head negative for acute abnormality.  Seen by neurology and TNKase not given as symptoms had resolved and chronically anticoagulated with Coumadin.  Neurology canceled code stroke and felt that her presentation was more consistent with toxic metabolic encephalopathy and fatigue in the setting of dementia.  Recommended workup for causes of toxic metabolic encephalopathy.  Neurology has signed off.  Family also reported patient having a cough.  Afebrile.  Labs showing WBC 12.3, hemoglobin 11.2 (at baseline), glucose 239, bicarb 30, BUN 32, creatinine 2.4 (baseline 1.7-1.8), calcium 10.9, albumin 3.8, alk phos 176, T. bili 1.5, AST and ALT normal, INR 3.1, blood ethanol level undetectable, SARS-CoV PCR negative, UA not suggestive of infection.  Chest x-ray showing low lung volumes and no active disease. Patient was given 1 L normal saline.  TRH called to admit.  Patient is oriented to person and place but otherwise very confused and not able to give any history.  Review of Systems:  Review of Systems  Reason unable to perform ROS: AMS.    Past Medical History:  Diagnosis Date  . Allergic rhinitis   . Asthma    NOS w/ acute exacerbation  . Blood transfusion without reported diagnosis   . CKD (chronic kidney disease) stage 3, GFR 30-59 ml/min (HCC) 09/12/2018  . Colon polyps    TUBULAR  ADENOMAS AND HYPERPLASTIC  . Complete heart block (HCC)    requiring PPM (MDT) post surgical myomectomy at [facility],  leads are epicardial with abdominal implant, high ventricular threshold at implant  . COPD (chronic obstructive pulmonary disease) (HCC)   . Depressive disorder   . Diastolic dysfunction   . DM (diabetes mellitus) (HCC)   . Gallstones   . GERD (gastroesophageal reflux disease)   . Gout 08/20/2012  . Heart murmur   . Hyperpotassemia   . Hypersomnia   . Hypertension   . Hypertrophic cardiomyopathy (HCC)    s/p surgical [facility]omectomy at [facility] 11/12 complicated by septal VSD post procedure requiring reoperation with patch closure and tricuspid valve replacement  . Kidney stones   . Myocardial infarction (HCC) 2011  . Obstructive sleep apnea    persistent daytime sleepiness despite cpap  . 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 Psoriasis   . Pyuria   . Renal insufficiency   . 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 Typical atrial flutter (HCC) 9/15    Past Surgical History:  Procedure Laterality Date  . ABDOMINAL HYSTERECTOMY    . APPENDECTOMY    . BREAST CYST EXCISION    . CARDIOVERSION N/A 08/01/2014   Procedure: CARDIOVERSION;  Surgeon: Vesta Mixer, MD;  Location: [facility] ENDOSCOPY;  Service: Cardiovascular;  Laterality: N/A;  . CESAREAN SECTION    . CHOLECYSTECTOMY    . COLONOSCOPY    . EYE SURGERY  01/16/2016   left eye lens implant  . EYE SURGERY  01/09/2016   right eye lens implant  .  INSERT / REPLACE / REMOVE PACEMAKER  07/13/2012  . PACEMAKER INSERTION  07/02/2011   epicardial wires with abdominal implant at [facility] 12/12,  high ventricular lead threshold at implant per Dr Christin Fudge  . PERMANENT PACEMAKER INSERTION N/A 07/13/2012   Procedure: PERMANENT PACEMAKER INSERTION;  Surgeon: Hillis Range, MD;  Location: [facility] CATH LAB;  Service: Cardiovascular;  Laterality: N/A;  . POLYPECTOMY    . septal myomectomy for hypertrophic CM  06/23/2011   by Dr Silvestre Mesi at [facility],  complicated by septal VSD requiring patch repair and tricuspid valve replacement  . TONSILLECTOMY    . TOOTH EXTRACTION    . TRICUSPID VALVE REPLACEMENT  06/2011   Medtronic 27mm Mosaic tissue valve  . VSD REPAIR  06/2011     reports that she has never smoked. She has never used smokeless tobacco. She reports that she does not drink alcohol and does not use drugs.  Allergies  Allergen Reactions  . Petrolatum-Zinc Oxide Anaphylaxis, Rash and Swelling  . Sulfa Antibiotics Rash  . Sulfonamide Derivatives Anaphylaxis and Swelling  . Tetanus Toxoid     Other reaction(s): Other (See Comments) OTHER REACTION  . Tetanus Toxoids Swelling  . Other Rash and Other (See Comments)    STERI - STRIPS  It can affect proteins in her kidneys so she doesn't take REACTION: proteinuria Other reaction(s): Other (See Comments), Unknown It can affect proteins in her kidneys so she doesn't take  . Nsaids Other (See Comments)    REACTION: proteinuria Other reaction(s): Other (See Comments), Unknown It can affect proteins in her kidneys so she doesn't take   . Tetanus Toxoid, Adsorbed Other (See Comments)    Other reaction(s): Other (See Comments) Turned arm red Turned arm red    Family History  Problem Relation Age of Onset  . Hypertension Daughter   . Heart disease Maternal Grandfather   . Heart disease Paternal Grandfather   . Bladder Cancer Father   . Prostate cancer Father   . Cancer Father   . Varicose Veins Father   . Heart attack Father   . Breast cancer Mother   . Dementia Mother   . Hypertension Mother   . Cancer Mother   . Ovarian cancer Maternal Aunt   . Pancreatic cancer Cousin   . Breast cancer Paternal Aunt   . Dementia Maternal Aunt        x 2  . Dementia Maternal Uncle        x 2  . Colon cancer Neg Hx   . Rectal cancer Neg Hx   . Stomach cancer Neg Hx   . Esophageal cancer Neg Hx     Prior to Admission medications   Medication Sig Start Date End Date Taking?  Authorizing Provider  allopurinol (ZYLOPRIM) 100 MG tablet TAKE ONE TABLET BY MOUTH DAILY 12/07/22   Etta Grandchild, MD  Betamethasone Valerate 0.12 % foam Apply 1 application topically daily. 04/02/23   Etta Grandchild, MD  buPROPion (WELLBUTRIN XL) 300 MG 24 hr tablet Take 1 tablet (300 mg total) by mouth daily. 05/01/23   Etta Grandchild, MD  dronabinol (MARINOL) 5 MG capsule Take 1 capsule by mouth two times daily before lunch and supper. 12/08/22   Etta Grandchild, MD  famotidine (PEPCID) 40 MG tablet TAKE ONE TABLET BY MOUTH DAILY 12/07/22   Etta Grandchild, MD  ferrous sulfate 325 (65 FE) MG tablet Take 1 tablet (325 mg total) by mouth 2 (two) times daily with a  meal. Patient taking differently: Take 325-650 mg by mouth daily with breakfast. 11/10/20   Etta Grandchild, MD  HYDROcodone-acetaminophen (NORCO/VICODIN) 5-325 MG tablet Take 1 tablet by mouth every 6 (six) hours as needed. 12/24/22   Clark, Meghan R, PA-C  hydrocortisone (ANUSOL-HC) 25 MG suppository Place 1 suppository (25 mg total) rectally 2 (two) times daily. 10/23/22   Wallis Bamberg, PA-C  ipratropium (ATROVENT) 0.03 % nasal spray USE 2 SPRAYS IN EACH NOSTRIL 3 TIMES A DAY AS NEEDED. Patient taking differently: Place 2 sprays into both nostrils daily as needed (nose bleed). 04/29/18   Coralyn Helling, MD  lamoTRIgine (LAMICTAL) 25 MG tablet Take 1 tablet (25 mg total) by mouth daily. 05/01/23   Etta Grandchild, MD  linaclotide [facility]) 72 MCG capsule TAKE 1 CAPSULE EVERY DAY BEFORE BREAKFAST 03/23/23   Etta Grandchild, MD  metoprolol tartrate (LOPRESSOR) 25 MG tablet TAKE ONE TABLET BY MOUTH TWICE DAILY 04/02/23   Etta Grandchild, MD  modafinil (PROVIGIL) 100 MG tablet Take 1 tablet (100 mg total) by mouth daily. Patient taking differently: Take 100 mg by mouth daily as needed (sleep disorder). 03/29/18   Coralyn Helling, MD  Pitavastatin Calcium 2 MG TABS TAKE ONE TABLET BY MOUTH DAILY 04/02/23   Etta Grandchild, MD  PRESCRIPTION MEDICATION 1 Dose  by Other route at bedtime as needed (sleep). CPAP    [provider]  Propylene Glycol (SYSTANE BALANCE OP) Place 1 drop into both eyes 4 (four) times daily as needed (dry eyes).    [provider]  REXULTI 0.25 MG TABS tablet TAKE 1 TABLET BY MOUTH DAILY 03/31/23   Etta Grandchild, MD  SYMBICORT 80-4.5 MCG/ACT inhaler USE 2 PUFFS TWICE A DAY. Patient taking differently: 2 puffs 2 (two) times daily. 10/11/20   Etta Grandchild, MD  torsemide (DEMADEX) 20 MG tablet Take 1 tablet (20 mg total) by mouth daily. 04/02/23   Etta Grandchild, MD  Vilazodone HCl (VIIBRYD) 40 MG TABS TAKE ONE TABLET BY MOUTH DAILY 12/07/22   Etta Grandchild, MD  warfarin (COUMADIN) 1 MG tablet Take 1 tablet (1 mg total) by mouth daily. TAKE 1 1/2 TABLET EVERY WEDNESDAY OR AS DIRECTED BY ANTICOAGULATION LCINIC 02/26/22   Etta Grandchild, MD  warfarin (COUMADIN) 2.5 MG tablet TAKE 1 TABLET BY MOUTH DAILY EXCEPT TAKE 1/2 TABLET ON WEDNESDAYS OR AS DIRECTED BY ANTICOAGULATION CLINIC 08/08/22   Etta Grandchild, MD    Physical Exam: Vitals:   05/15/23 2000 05/15/23 2030 05/15/23 2130 05/15/23 2200  BP: (!) 150/63 (!) 145/59 129/63 (!) 130/55  Pulse: 68 60 60 61  Resp: 18 (!) 21 20 (!) 22  Temp:      TempSrc:      SpO2: 95% 94% 93% 93%  Weight:      Height:        Physical Exam Vitals reviewed.  Constitutional:      General: She is not in acute distress. HENT:     Head: Normocephalic and atraumatic.  Eyes:     Extraocular Movements: Extraocular movements intact.  Cardiovascular:     Rate and Rhythm: Normal rate and regular rhythm.     Pulses: Normal pulses.  Pulmonary:     Effort: Pulmonary effort is normal. No respiratory distress.     Breath sounds: Normal breath sounds. No wheezing or rales.  Abdominal:     General: Bowel sounds are normal.     Palpations: Abdomen is soft.  Tenderness: There is no abdominal tenderness. There is no guarding.  Musculoskeletal:     Cervical back: Normal range of  motion.     Right lower leg: No edema.     Left lower leg: No edema.  Skin:    General: Skin is warm and dry.  Neurological:     General: No focal deficit present.     Mental Status: She is alert.     Comments: Oriented to person and place only Very confused     Labs on Admission: I have personally reviewed following labs and imaging studies  CBC: Recent Labs  Lab 05/15/23 1742 05/15/23 1748  WBC 12.3*  --   NEUTROABS 10.5*  --   HGB 11.2* 12.9  HCT 35.8* 38.0  MCV 96.5  --   PLT 173  --    Basic Metabolic Panel: Recent Labs  Lab 05/15/23 1748 05/15/23 1828  NA 135 138  K 5.7* 4.0  CL 95* 94*  CO2  --  30  GLUCOSE 264* 239*  BUN 51* 32*  CREATININE 2.60* 2.47*  CALCIUM  --  10.9*   GFR: Estimated Creatinine Clearance: 18.2 mL/min (A) (by C-G formula based on SCr of 2.47 mg/dL (H)). Liver Function Tests: Recent Labs  Lab 05/15/23 1828  AST 21  ALT 14  ALKPHOS 176*  BILITOT 1.5*  PROT 7.3  ALBUMIN 3.8   No results for input(s): "LIPASE", "AMYLASE" in the last 168 hours. No results for input(s): "AMMONIA" in the last 168 hours. Coagulation Profile: Recent Labs  Lab 05/15/23 1742  INR 3.1*   Cardiac Enzymes: No results for input(s): "CKTOTAL", "CKMB", "CKMBINDEX", "TROPONINI" in the last 168 hours. BNP (last 3 results) Recent Labs    07/01/22 1503  PROBNP 431.0*   HbA1C: No results for input(s): "HGBA1C" in the last 72 hours. CBG: Recent Labs  Lab 05/15/23 1742 05/15/23 2046  GLUCAP 255* 152*   Lipid Profile: No results for input(s): "CHOL", "HDL", "LDLCALC", "TRIG", "CHOLHDL", "LDLDIRECT" in the last 72 hours. Thyroid Function Tests: No results for input(s): "TSH", "T4TOTAL", "FREET4", "T3FREE", "THYROIDAB" in the last 72 hours. Anemia Panel: No results for input(s): "VITAMINB12", "FOLATE", "FERRITIN", "TIBC", "IRON", "RETICCTPCT" in the last 72 hours. Urine analysis:    Component Value Date/Time   COLORURINE YELLOW 05/15/2023 1935    APPEARANCEUR CLEAR 05/15/2023 1935   LABSPEC 1.009 05/15/2023 1935   PHURINE 5.0 05/15/2023 1935   GLUCOSEU 50 (A) 05/15/2023 1935   GLUCOSEU NEGATIVE 07/01/2022 1503   HGBUR NEGATIVE 05/15/2023 1935   BILIRUBINUR NEGATIVE 05/15/2023 1935   BILIRUBINUR neg 05/14/2012 1053   KETONESUR NEGATIVE 05/15/2023 1935   PROTEINUR 30 (A) 05/15/2023 1935   UROBILINOGEN 1.0 07/01/2022 1503   NITRITE NEGATIVE 05/15/2023 1935   LEUKOCYTESUR NEGATIVE 05/15/2023 1935    Radiological Exams on Admission: DG Chest Portable 1 View  Result Date: 05/15/2023 CLINICAL DATA:  Cough EXAM: PORTABLE CHEST 1 VIEW COMPARISON:  Radiograph 07/01/2022 FINDINGS: Left chest wall CRT-P. Sternotomy and CABG. Epicardial pacing wires. Stable cardiomediastinal silhouette. Aortic atherosclerotic calcification. Low lung volumes accentuate pulmonary vascularity. No pleural effusion or pneumothorax. IMPRESSION: Low lung volumes.  No active disease. Electronically Signed   By: Minerva Fester M.D.   On: 05/15/2023 21:57   CT HEAD CODE STROKE WO CONTRAST  Result Date: 05/15/2023 CLINICAL DATA:  Code stroke. Neuro deficit, acute, stroke suspected. Left-sided weakness. EXAM: CT HEAD WITHOUT CONTRAST TECHNIQUE: Contiguous axial images were obtained from the base of the skull through the vertex  without intravenous contrast. RADIATION DOSE REDUCTION: This exam was performed according to the departmental dose-optimization program which includes automated exposure control, adjustment of the mA and/or kV according to patient size and/or use of iterative reconstruction technique. COMPARISON:  Head CT 03/19/2022. FINDINGS: Brain: No acute hemorrhage. Unchanged moderate chronic small-vessel disease. Cortical gray-white differentiation is otherwise preserved. Prominence of the ventricles and sulci within expected range for age. No hydrocephalus or extra-axial collection. No mass effect or midline shift. Vascular: No hyperdense vessel or unexpected  calcification. Skull: No calvarial fracture or suspicious bone lesion. Skull base is unremarkable. Sinuses/Orbits: No acute finding. Other: None. ASPECTS [facility] Stroke Program Early CT Score) - Ganglionic level infarction (caudate, lentiform nuclei, internal capsule, insula, M1-M3 cortex): 7 - Supraganglionic infarction (M4-M6 cortex): 3 Total score (0-10 with 10 being normal): 10 IMPRESSION: 1. No acute intracranial hemorrhage or evidence of acute large vessel territory infarct. ASPECT score is 10. 2. Stable moderate chronic small-vessel disease. Electronically Signed   By: Orvan Falconer M.D.   On: 05/15/2023 18:00    EKG: Independently reviewed.  Interpretation limited secondary to paced rhythm.  Assessment and Plan  Confusion, acute onset left-sided weakness CT head negative for acute abnormality.  Seen by neurology and TNKase not given as symptoms had resolved and chronically anticoagulated with Coumadin.  Neurology canceled code stroke and felt that her presentation was more consistent with toxic metabolic encephalopathy and fatigue in the setting of dementia.  Recommended workup for causes of toxic metabolic encephalopathy.  Neurology has signed off.  UA not suggestive of infection.  Blood ethanol level undetectable.  Will check TSH, B12, and ammonia.  Brain MRI ordered.  AKI on CKD stage IIIb Possibly prerenal from dehydration.  Creatinine currently 2.4 and baseline is 1.7-1.8.  Patient was given 1 L normal saline in the ED.  Patient has passed swallow screen in the ED, encourage p.o. hydration given current systemwide IV fluid shortage.  Repeat labs in the morning to check renal function.  Avoid nephrotoxic agents/hold home torsemide at this time..  Chronic HFpEF Echo done in August 2023 showing EF 50 to 55%.  No signs of volume overload, check BNP.  A-fib on Coumadin INR 3.1.  Coumadin dosing per pharmacy.  Hyperlipidemia  COPD  Hyperglycemia Glucose 239 on metabolic panel, now  improved to 152 on repeat CBG check.  No documented history of diabetes and last A1c was 5.3 in August 2023.  Will repeat A1c and order very sensitive sliding scale insulin ACHS.  Mild hypercalcemia Patient was given IV fluids in the ED.  Repeat labs in the morning.  DVT prophylaxis: Coumadin Code Status: Full Code by default.  Patient does not have capacity for decision-making and no family available at this time.  She was listed as full code during her previous hospitalization in August 2023. Level of care: {Blank single:19197::"Med-Surg","Telemetry bed","Progressive Care Unit","Step Down Unit"} Admission status: *** Time Spent: 75+ minutes***  John Giovanni MD [facility]  If 7PM-7AM, please contact night-coverage [email or web address]  05/15/2023, 10:56 PM

## 2023-05-16 NOTE — ED Notes (Signed)
Patient transported to CT 

## 2023-05-17 DIAGNOSIS — H1131 Conjunctival hemorrhage, right eye: Secondary | ICD-10-CM | POA: Diagnosis not present

## 2023-05-17 DIAGNOSIS — I5032 Chronic diastolic (congestive) heart failure: Secondary | ICD-10-CM | POA: Diagnosis not present

## 2023-05-17 DIAGNOSIS — N179 Acute kidney failure, unspecified: Secondary | ICD-10-CM | POA: Diagnosis not present

## 2023-05-17 DIAGNOSIS — R739 Hyperglycemia, unspecified: Secondary | ICD-10-CM

## 2023-05-17 DIAGNOSIS — R400 Somnolence: Secondary | ICD-10-CM | POA: Diagnosis not present

## 2023-05-17 DIAGNOSIS — G9341 Metabolic encephalopathy: Secondary | ICD-10-CM | POA: Diagnosis not present

## 2023-05-17 DIAGNOSIS — J449 Chronic obstructive pulmonary disease, unspecified: Secondary | ICD-10-CM | POA: Diagnosis not present

## 2023-05-17 DIAGNOSIS — I480 Paroxysmal atrial fibrillation: Secondary | ICD-10-CM | POA: Diagnosis not present

## 2023-05-17 DIAGNOSIS — N189 Chronic kidney disease, unspecified: Secondary | ICD-10-CM | POA: Diagnosis not present

## 2023-05-17 LAB — GLUCOSE, CAPILLARY
Glucose-Capillary: 117 mg/dL — ABNORMAL HIGH (ref 70–99)
Glucose-Capillary: 121 mg/dL — ABNORMAL HIGH (ref 70–99)
Glucose-Capillary: 130 mg/dL — ABNORMAL HIGH (ref 70–99)
Glucose-Capillary: 151 mg/dL — ABNORMAL HIGH (ref 70–99)
Glucose-Capillary: 156 mg/dL — ABNORMAL HIGH (ref 70–99)

## 2023-05-17 LAB — CBC
HCT: 33.3 % — ABNORMAL LOW (ref 36.0–46.0)
Hemoglobin: 10.7 g/dL — ABNORMAL LOW (ref 12.0–15.0)
MCH: 30.9 pg (ref 26.0–34.0)
MCHC: 32.1 g/dL (ref 30.0–36.0)
MCV: 96.2 fL (ref 80.0–100.0)
Platelets: 127 10*3/uL — ABNORMAL LOW (ref 150–400)
RBC: 3.46 MIL/uL — ABNORMAL LOW (ref 3.87–5.11)
RDW: 14.6 % (ref 11.5–15.5)
WBC: 9.9 10*3/uL (ref 4.0–10.5)
nRBC: 0 % (ref 0.0–0.2)

## 2023-05-17 LAB — RENAL FUNCTION PANEL
Albumin: 3.2 g/dL — ABNORMAL LOW (ref 3.5–5.0)
Anion gap: 9 (ref 5–15)
BUN: 32 mg/dL — ABNORMAL HIGH (ref 8–23)
CO2: 27 mmol/L (ref 22–32)
Calcium: 10.4 mg/dL — ABNORMAL HIGH (ref 8.9–10.3)
Chloride: 100 mmol/L (ref 98–111)
Creatinine, Ser: 1.87 mg/dL — ABNORMAL HIGH (ref 0.44–1.00)
GFR, Estimated: 27 mL/min — ABNORMAL LOW (ref >60)
Glucose, Bld: 153 mg/dL — ABNORMAL HIGH (ref 70–99)
Phosphorus: 3 mg/dL (ref 2.5–4.6)
Potassium: 4 mmol/L (ref 3.5–5.1)
Sodium: 136 mmol/L (ref 135–145)

## 2023-05-17 LAB — PROTIME-INR
INR: 2.9 — ABNORMAL HIGH (ref 0.8–1.2)
Prothrombin Time: 30.6 s — ABNORMAL HIGH (ref 11.4–15.2)

## 2023-05-17 MED ORDER — WARFARIN SODIUM 1 MG PO TABS
1.5000 mg | ORAL_TABLET | Freq: Once | ORAL | Status: AC
  Administered 2023-05-17: 1.5 mg via ORAL
  Filled 2023-05-17: qty 1

## 2023-05-17 NOTE — Care Management Obs Status (Signed)
MEDICARE OBSERVATION STATUS NOTIFICATION   Patient Details  Name: NEETI KNUDTSON MRN: [id number] Date of Birth: 1941/06/26   Medicare Observation Status Notification Given:  Yes    Lawerance Sabal, RN 05/17/2023, 7:59 AM

## 2023-05-17 NOTE — Assessment & Plan Note (Signed)
Patient appears dry with decreased p.o. intake and continue to take torsemide creatinine started improving with IV fluid. -Holding home torsemide -Monitor renal function -Nephrotoxins

## 2023-05-17 NOTE — Progress Notes (Signed)
Progress Note   Patient: Holly Simmons:[id number] DOB: 30-Nov-1940 DOA: 05/15/2023     0 DOS: the patient was seen and examined on 05/17/2023   Brief hospital course: Taken from H&P.  Holly Simmons is a 82 y.o. female with medical history significant of CKD stage IIIb, chronic HFpEF, hypertrophic cardiomyopathy status post myomectomy in 2012, CHB status post PPM, bioprosthetic TV, atrial fibrillation on Coumadin, hyperlipidemia, dementia, COPD presented to the ED as code stroke from urgent care for evaluation of confusion and acute onset left-sided weakness.  Last known well at 1455 on the day of admission.   CT head negative for acute abnormality. Seen by neurology and TNKase not given as symptoms had resolved and chronically anticoagulated with Coumadin.   Per neurology her presentation is more consistent with toxic metabolic encephalopathy and fatigue in the setting of advanced dementia.  Labs with leukocytosis at 12.3, glucose 239, bicarb 30, BUN 32, creatinine 2.4 with baseline around 1.7-1.8, calcium 10.9, alkaline phosphatase 176, T. bili 1.5, INR 3.1, COVID-19 PCR negative, UA not suggestive of infection. CXR with low lung volumes but no active disease. MRI brain was ordered but cannot be done until Monday due to her history of pacemaker.  10/12: Vital stable.  Concern of worsening confusion overnight so repeat CT head was done which was negative for any acute abnormality.  Labs with A1c of 6.6, slight worsening of leukocytosis at 14.6, platelets decreased 123, INR 3.5, B12 370 with goal should be more than 400, TSH and ammonia levels normal, vitamin D 25-hydroxy normal at 43, BNP elevated at 578.  Some improvement of creatinine to 2.19.  Troponin 38.  EKG with paced rhythm, ST depression but that seems chronic, worsening responsiveness and lethargy.  Per son she is normally a light sleeper.  No recent illnesses or upper respiratory symptoms, procalcitonin at  0.20. Message sent to neurology to reevaluate. EEG also ordered  10/13: Vital stable.  EEG with mild to moderate diffuse encephalopathy.  No seizure or epileptiform discharges were recorded.  Neurology thinks this is due to advancing dementia as patient has an history of slow decline over the past few years.  MRI still pending, even if she was found to have a small stroke she is on the maximum therapy with Coumadin and no further recommendations from neurology.  INR started improving, at 2.9 today PT/ OT recommending home health. Developed significant erythema and periorbital edema of right eye-consulted ophthalmology and they will see her, talk with Dr.N. Allena Katz.    Assessment and Plan: * Altered mental status Acute onset left-sided weakness. CT head negative for acute abnormality. Seen by neurology and TNKase not given as symptoms had resolved and chronically anticoagulated with Coumadin. Neurology canceled code stroke and felt that her presentation was more consistent with toxic metabolic encephalopathy and fatigue in the setting of dementia.   Neurology thinks that she was having decreased clearance of Wellbutrin causing more somnolent due to AKI and recommending holding Wellbutrin for 3 days. Patient more alert today. TIA remained in differential.  Left-sided weakness has been improved but but patient remained very somnolent.  MRI pending but unfortunately cannot be done before Monday as patient has pacemaker in place. EEG with some encephalopathy but no seizure So far infectious workup negative.  -Holding home Lamictal and Wellbutrin -Avoid sedating medications -Continue to monitor -Supportive care   Acute kidney injury superimposed on chronic kidney disease (HCC) Patient appears dry with decreased p.o. intake and continue to take torsemide creatinine  started improving with IV fluid. -Holding home torsemide -Monitor renal function -Nephrotoxins  Hypercalcemia Likely with  dehydration as improved with IV fluid. Patient was also on calcium supplement at home Vitamin D levels normal, parathyroid pending -Hold calcium supplement -Continue to monitor  Conjunctival hemorrhage of right eye Patient developed significant erythema of right eye, no pain or purulent discharge.  Significant eyelid erythema and edema. Appears to have conjunctival hemorrhage. -Ophthalmology was consulted and they will see the patient -Follow-up pulmonology recommendations  Hyperglycemia 1 episode of hyperglycemia with blood glucose above 200.  A1c of 6.6, it was 5.3 in August 2023. -Continue to monitor -Patient need to discuss with PCP and they can start her on on any medications if needed -Carb modified diet  COPD (chronic obstructive pulmonary disease) (HCC) No concern of exacerbation. -Continue home bronchodilators  A-fib (HCC) Borderline bradycardia. -Holding home metoprolol -Coumadin per pharmacy-currently INR at 2.9  Chronic heart failure with preserved ejection fraction (HFpEF) (HCC) Clinically appears dry, BNP at 578. Patient was on torsemide at home which is currently being held. -Monitor volume status closely as patient is getting some IV fluid   Subjective: Patient was more alert and interactive today.  Family concerned about worsening erythema of right eye.  Patient denies any pain.  Physical Exam: Vitals:   05/16/23 2351 05/17/23 0357 05/17/23 0744 05/17/23 1217  BP: (!) 156/53 (!) 165/94 125/72 (!) 146/53  Pulse:  60 61 (!) 59  Resp: 18 19 18    Temp: 97.9 F (36.6 C) 98.2 F (36.8 C) 98.6 F (37 C) 98 F (36.7 C)  TempSrc: Axillary Axillary    SpO2: 92% 92% (!) 80% 98%  Weight:      Height:       General.  Frail elderly lady, in no acute distress.  Significant erythema like conjunctival hemorrhage of right eye, no discharge, surrounding lid edema and erythema Pulmonary.  Lungs clear bilaterally, normal respiratory effort. CV.  Regular rate and  rhythm, no JVD, rub or murmur. Abdomen.  Soft, nontender, nondistended, BS positive. CNS.  Alert and oriented .  No focal neurologic deficit. Extremities.  No edema, no cyanosis, pulses intact and symmetrical.   Data Reviewed: Prior data reviewed  Family Communication: Cussed with son and DIL at bedside  Disposition: Status is: Observation The patient remains OBS appropriate and will d/c before 2 midnights.  Planned Discharge Destination: Home with Home Health  DVT prophylaxis.  Coumadin Time spent: 45 minutes  This record has been created using Conservation officer, historic buildings. Errors have been sought and corrected,but may not always be located. Such creation errors do not reflect on the standard of care.   Author: Arnetha Courser, MD 05/17/2023 1:33 PM  For on call review [email or web address].

## 2023-05-17 NOTE — Evaluation (Signed)
Physical Therapy Evaluation Patient Details Name: Holly Simmons MRN: [id number] DOB: 31-Jul-1941 Today's Date: 05/17/2023  History of Present Illness  82 y.o. female presented 05/15/23 to the ED for evaluation of confusion and acute onset left-sided weakness. CT head negative and symptoms resolved. Neuro cancelled code stroke; +encephalopathy, AKI, MRI brain pending (has PPM); 2nd CT head 10/12 negative  PMH significant of CKD stage IIIb, chronic HFpEF, hypertrophic cardiomyopathy status post myomectomy in 2012, CHB status post PPM, bioprosthetic TV, atrial fibrillation on Coumadin, hyperlipidemia, dementia, COPD  Clinical Impression   Pt admitted secondary to problem above with deficits below. PTA patient was living at home with 24 hour care between her children and paid caregiver. She typically walked without a device, however sometimes used rollator. She has one step to enter home and always has assist to do this. Pt currently requires min assist for transfers and ambulation with RW. Per family, her gait was "more shuffly" than her usual. She fatigued quickly with max ambulation distance 12 ft (repeated x 2). Anticipate she will return home with HHPT, however not yet ready for home from a mobility perspective. Will see again 10/14 a.m. and assess needs. Anticipate patient will benefit from PT to address problems listed below.Will continue to follow acutely to maximize functional mobility independence and safety.           If plan is discharge home, recommend the following: A little help with walking and/or transfers;A little help with bathing/dressing/bathroom;Assistance with cooking/housework;Direct supervision/assist for medications management;Direct supervision/assist for financial management;Assist for transportation;Help with stairs or ramp for entrance;Supervision due to cognitive status   Can travel by private vehicle        Equipment Recommendations None recommended by PT   Recommendations for Other Services  OT consult    Functional Status Assessment Patient has had a recent decline in their functional status and demonstrates the ability to make significant improvements in function in a reasonable and predictable amount of time.     Precautions / Restrictions Precautions Precautions: Fall Restrictions Weight Bearing Restrictions: No      Mobility  Bed Mobility Overal bed mobility: Needs Assistance Bed Mobility: Supine to Sit, Sit to Supine     Supine to sit: Min assist, HOB elevated Sit to supine: Contact guard assist   General bed mobility comments: pt reachs for someone to pull up on (as she does at home per daughter)    Transfers Overall transfer level: Needs assistance Equipment used: Rolling walker (2 wheels) Transfers: Sit to/from Stand Sit to Stand: Min assist           General transfer comment: pt accustomed to rollator or someone to hold her hand as she comes up; RW anchored for safety and min assist from EOB, CGA from toilet    Ambulation/Gait Ambulation/Gait assistance: Min assist Gait Distance (Feet): 12 Feet (toileted, 12 (fatigued after washing hands standing at sink)) Assistive device: Rolling walker (2 wheels) Gait Pattern/deviations: Step-through pattern, Decreased step length - right, Decreased step length - left, Shuffle   Gait velocity interpretation: <1.8 ft/sec, indicate of risk for recurrent falls   General Gait Details: Family reports she is shuffling more than usual and they can tell she is weak from 2 days in the bed  Stairs            Wheelchair Mobility     Tilt Bed    Modified Rankin (Stroke Patients Only)       Balance Overall balance assessment: Needs assistance Sitting-balance  support: No upper extremity supported, Feet supported Sitting balance-Leahy Scale: Fair     Standing balance support: No upper extremity supported, During functional activity Standing balance-Leahy Scale:  Fair Standing balance comment: standing at sink                             Pertinent Vitals/Pain Pain Assessment Pain Assessment: No/denies pain    Home Living Family/patient expects to be discharged to:: Private residence Living Arrangements: Children (daughter moved in) Available Help at Discharge: Family;Personal care attendant;Available 24 hours/day Type of Home: House Home Access: Stairs to enter Entrance Stairs-Rails: None Entrance Stairs-Number of Steps: 1   Home Layout: Multi-level;Able to live on main level with bedroom/bathroom Home Equipment: Rollator (4 wheels);Shower seat;Shower seat - built in Additional Comments: 2nd shower chair in bathroom to sit and rest on; has access to manual w/c    Prior Function Prior Level of Function : Needs assist             Mobility Comments: used rollator sometimes inside but always outside; always someone with her if she does stetps ADLs Comments: assist with shower transfer, usually able to bathe herself; assist with dressing sometimes (bra, socks),     Extremity/Trunk Assessment   Upper Extremity Assessment Upper Extremity Assessment: Defer to OT evaluation    Lower Extremity Assessment Lower Extremity Assessment: Generalized weakness (no appreciable difference L vs R)    Cervical / Trunk Assessment Cervical / Trunk Assessment: Kyphotic  Communication   Communication Communication: No apparent difficulties Cueing Techniques: Verbal cues;Tactile cues;Visual cues  Cognition Arousal: Lethargic (quickly drops off to sleep if not engaged in conversation/activity) Behavior During Therapy: Flat affect Overall Cognitive Status: History of cognitive impairments - at baseline                                 General Comments: son and daughter present; had to correct every home setup and prior functional status question pt answered        General Comments General comments (skin integrity, edema,  etc.): Son and daughter present.    Exercises     Assessment/Plan    PT Assessment Patient needs continued PT services  PT Problem List Decreased strength;Decreased activity tolerance;Decreased mobility;Decreased balance;Decreased cognition;Decreased knowledge of use of DME;Decreased safety awareness;Decreased knowledge of precautions       PT Treatment Interventions DME instruction;Gait training;Stair training;Functional mobility training;Therapeutic activities;Therapeutic exercise;Balance training;Cognitive remediation;Patient/family education    PT Goals (Current goals can be found in the Care Plan section)  Acute Rehab PT Goals Patient Stated Goal: unable to state PT Goal Formulation: With patient/family Time For Goal Achievement: 05/31/23 Potential to Achieve Goals: Good    Frequency Min 1X/week     Co-evaluation               AM-PAC PT "6 Clicks" Mobility  Outcome Measure Help needed turning from your back to your side while in a flat bed without using bedrails?: A Little Help needed moving from lying on your back to sitting on the side of a flat bed without using bedrails?: A Little Help needed moving to and from a bed to a chair (including a wheelchair)?: A Little Help needed standing up from a chair using your arms (e.g., wheelchair or bedside chair)?: A Little Help needed to walk in hospital room?: A Lot (<20 ft) Help needed climbing 3-5 steps  with a railing? : Total 6 Click Score: 15    End of Session Equipment Utilized During Treatment: Gait belt Activity Tolerance: Patient limited by fatigue Patient left: in bed;with call bell/phone within reach;with bed alarm set;with family/visitor present Nurse Communication: Mobility status;Other (comment) (dark stool) PT Visit Diagnosis: Other abnormalities of gait and mobility (R26.89);Muscle weakness (generalized) (M62.81)    Time: 1027-2536 PT Time Calculation (min) (ACUTE ONLY): 31 min   Charges:   PT  Evaluation $PT Eval Low Complexity: 1 Low PT Treatments $Gait Training: 8-22 mins PT General Charges $$ ACUTE PT VISIT: 1 Visit          Jerolyn Center, PT Acute Rehabilitation Services  Office [phone]   Zena Amos 05/17/2023, 10:21 AM

## 2023-05-17 NOTE — Progress Notes (Signed)
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  She was evaluated for stroke, and initial head CT was found to be negative and patient's exam at that time was nonfocal.  She was noted to be somewhat drowsy at the time, and we are consulted again today for persistent drowsiness.  Discussed patient's family's concerns, and they stated that they have noted a gradual decline in patient's cognitive abilities over the past few years.  This is consistent with her history of dementia.  Subjective: Awake, alert, back to her baseline per family at bedside. Family worried bout scleral hemorrhage that seems to be progressively worsening.  Exam: Vitals:   05/17/23 0744 05/17/23 1217  BP: 125/72 (!) 146/53  Pulse: 61 (!) 59  Resp: 18   Temp: 98.6 F (37 C) 98 F (36.7 C)  SpO2: (!) 80% 98%   Gen: In bed, NAD Resp: non-labored breathing, no acute distress Abd: soft, nt  Neuro: Mental Status: awake, alert responds appropriately, oriented to person place disoriented to time but able to state that her children brought her to the hospital yesterday because they were concerned about her.  She is able to do simple addition but not subtraction.  She can name objects, and speech is clear and fluent Cranial Nerves: Pupils equal round and reactive, extraocular movements intact, face symmetrical, hearing intact to voice, phonation normal, shoulder shrug symmetrical, tongue midline Motor: Able to move all 4 extremities with good antigravity strength Sensory: Intact to light touch throughout Gait: Deferred  Pertinent Labs:    Latest Ref Rng & Units 05/17/2023   11:16 AM 05/16/2023    1:03 AM 05/15/2023    5:48 PM  CBC  WBC 4.0 - 10.5 K/uL 9.9  14.6    Hemoglobin  12.0 - 15.0 g/dL 16.1  09.6  04.5   Hematocrit 36.0 - 46.0 % 33.3  33.4  38.0   Platelets 150 - 400 K/uL 127  123         Latest Ref Rng & Units 05/17/2023   11:16 AM 05/16/2023   12:20 AM 05/15/2023    6:28 PM  BMP  Glucose 70 - 99 mg/dL 409  811  914   BUN 8 - 23 mg/dL 32  31  32   Creatinine 0.44 - 1.00 mg/dL 7.82  9.56  2.13   Sodium 135 - 145 mmol/L 136  136  138   Potassium 3.5 - 5.1 mmol/L 4.0  3.8  4.0   Chloride 98 - 111 mmol/L 100  98  94   CO2 22 - 32 mmol/L 27  25  30    Calcium 8.9 - 10.3 mg/dL 08.6  9.9  57.8      Imaging Reviewed:  CT head: No acute abnormality  EEG: This study is suggestive of mild to moderate diffuse encephalopathy. No seizures or epileptiform discharges were seen throughout the recording.   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  We were reconsulted today for persistent drowsiness.  However, on exam patient is somewhat drowsy but awakens and responds appropriately when her name is called.  She is able to converse and consider that her children brought her to the hospital yesterday because they were  worried about her.  Family states they were concerned because of her drowsiness, worried about the possibility of a stroke and that they have noticed a steady cognitive decline over the past few years.  On arrival, patient's creatinine was noted to be elevated, and her tongue was dry, suggesting dehydration and AKI.  She is on bupropion at home, and this could have accumulated, causing some drowsiness.  Recommend holding bupropion for a few days as AKI resolves  Impression: Toxic metabolic encephalopathy in the setting of AKI with bupropion accumulation  Recommendations: - Personally dont think she will need an MRI  Brain. Will defer to hospitalist team if they feel this is really needed. I dont think would change much in terms of her management. - can resume baclofen as able. - we will signoff. Please feel free to contact us with any questions or concerns.  Medard Decuir Myra Rude, MD [facility]

## 2023-05-17 NOTE — Assessment & Plan Note (Signed)
Likely with dehydration as improved with IV fluid. Patient was also on calcium supplement at home Vitamin D levels normal, parathyroid pending -Hold calcium supplement -Continue to monitor

## 2023-05-17 NOTE — Assessment & Plan Note (Signed)
Borderline bradycardia. -Holding home metoprolol -Coumadin per pharmacy-currently INR at 2.9

## 2023-05-17 NOTE — Assessment & Plan Note (Signed)
Acute onset left-sided weakness. CT head negative for acute abnormality. Seen by neurology and TNKase not given as symptoms had resolved and chronically anticoagulated with Coumadin. Neurology canceled code stroke and felt that her presentation was more consistent with toxic metabolic encephalopathy and fatigue in the setting of dementia.   Neurology thinks that she was having decreased clearance of Wellbutrin causing more somnolent due to AKI and recommending holding Wellbutrin for 3 days. Patient more alert today. TIA remained in differential.  Left-sided weakness has been improved but but patient remained very somnolent.  MRI pending but unfortunately cannot be done before Monday as patient has pacemaker in place. EEG with some encephalopathy but no seizure So far infectious workup negative.  -Holding home Lamictal and Wellbutrin -Avoid sedating medications -Continue to monitor -Supportive care

## 2023-05-17 NOTE — Plan of Care (Signed)
  Problem: Coping: Goal: Ability to adjust to condition or change in health will improve Outcome: Progressing   Problem: Fluid Volume: Goal: Ability to maintain a balanced intake and output will improve Outcome: Progressing   Problem: Metabolic: Goal: Ability to maintain appropriate glucose levels will improve Outcome: Progressing   Problem: Skin Integrity: Goal: Risk for impaired skin integrity will decrease Outcome: Progressing   Problem: Tissue Perfusion: Goal: Adequacy of tissue perfusion will improve Outcome: Progressing   Problem: Clinical Measurements: Goal: Will remain free from infection Outcome: Progressing

## 2023-05-17 NOTE — Assessment & Plan Note (Signed)
Patient developed significant erythema of right eye, no pain or purulent discharge.  Significant eyelid erythema and edema. Appears to have conjunctival hemorrhage. -Ophthalmology was consulted and they will see the patient -Follow-up pulmonology recommendations
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
## 2023-05-17 NOTE — Progress Notes (Signed)
ANTICOAGULATION CONSULT NOTE - Initial Consult  Pharmacy Consult for Warfarin Indication: atrial fibrillation  Allergies  Allergen Reactions   Petrolatum-Zinc Oxide Anaphylaxis, Rash and Swelling   Sulfa Antibiotics Rash   Sulfonamide Derivatives Anaphylaxis and Swelling   Tetanus Toxoid     Other reaction(s): Other (See Comments) OTHER REACTION   Tetanus Toxoids Swelling   Other Rash and Other (See Comments)    STERI - STRIPS  It can affect proteins in her kidneys so she doesn't take REACTION: proteinuria Other reaction(s): Other (See Comments), Unknown It can affect proteins in her kidneys so she doesn't take   Nsaids Other (See Comments)    REACTION: proteinuria Other reaction(s): Other (See Comments), Unknown It can affect proteins in her kidneys so she doesn't take    Tetanus Toxoid, Adsorbed Other (See Comments)    Other reaction(s): Other (See Comments) Turned arm red Turned arm red    Patient Measurements: Height: 5\' 3"  (160 cm) Weight: 77.1 kg (170 lb) IBW/kg (Calculated) : 52.4 Vital Signs: Temp: 98.2 F (36.8 C) (10/13 0357) Temp Source: Axillary (10/13 0357) BP: 165/94 (10/13 0357) Pulse Rate: 60 (10/13 0357)  Labs: Recent Labs    05/15/23 1742 05/15/23 1748 05/15/23 1828 05/16/23 0020 05/16/23 0040 05/16/23 0103 05/16/23 0818 05/16/23 1155  HGB 11.2* 12.9  --   --   --  10.5*  --   --   HCT 35.8* 38.0  --   --   --  33.4*  --   --   PLT 173  --   --   --   --  123*  --   --   APTT 44*  --   --   --   --   --   --   --   LABPROT 32.2*  --   --   --  35.1*  --   --  36.8*  INR 3.1*  --   --   --  3.5*  --   --  3.7*  CREATININE  --  2.60* 2.47* 2.19*  --   --   --   --   TROPONINIHS  --   --   --   --   --   --  38* 29*    Estimated Creatinine Clearance: 20.5 mL/min (A) (by C-G formula based on SCr of 2.19 mg/dL (H)).   Medical History: Past Medical History:  Diagnosis Date   Allergic rhinitis    Asthma    NOS w/ acute exacerbation    Blood transfusion without reported diagnosis    CKD (chronic kidney disease) stage 3, GFR 30-59 ml/min (HCC) 09/12/2018   Colon polyps    TUBULAR ADENOMAS AND HYPERPLASTIC   Complete heart block (HCC)    requiring PPM (MDT) post surgical myomectomy at [facility],  leads are epicardial with abdominal implant, high ventricular threshold at implant   COPD (chronic obstructive pulmonary disease) (HCC)    Depressive disorder    Diastolic dysfunction    DM (diabetes mellitus) (HCC)    Gallstones    GERD (gastroesophageal reflux disease)    Gout 08/20/2012   Heart murmur    Hyperpotassemia    Hypersomnia    Hypertension    Hypertrophic cardiomyopathy (HCC)    s/p surgical [facility] 11/12 complicated by septal VSD post procedure requiring reoperation with patch closure and tricuspid valve replacement   Kidney stones    Myocardial infarction (HCC) 2011   Obstructive sleep apnea  persistent daytime sleepiness despite cpap   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   Psoriasis    Pyuria    Renal insufficiency    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   Typical atrial flutter (HCC) 9/15    Medications:  Medications Prior to Admission  Medication Sig Dispense Refill Last Dose   allopurinol (ZYLOPRIM) 100 MG tablet TAKE ONE TABLET BY MOUTH DAILY 90 tablet 1 05/15/2023 at am   Betamethasone Valerate 0.12 % foam Apply 1 application topically daily. (Patient taking differently: Apply 1 application  topically once a week.) 100 g 0 Past Week   buPROPion (WELLBUTRIN XL) 300 MG 24 hr tablet Take 1 tablet (300 mg total) by mouth daily. 90 tablet 0 05/15/2023 at am   famotidine (PEPCID) 40 MG tablet TAKE ONE TABLET BY MOUTH DAILY 90 tablet 1 05/14/2023 at pm   ferrous sulfate 325 (65 FE) MG tablet Take 1 tablet (325 mg total) by mouth 2 (two) times daily with a meal. (Patient taking differently: Take 325 mg by mouth daily with breakfast.) 180 tablet 1 05/15/2023   ipratropium (ATROVENT) 0.03 % nasal spray  USE 2 SPRAYS IN EACH NOSTRIL 3 TIMES A DAY AS NEEDED. (Patient taking differently: Place 2 sprays into both nostrils daily as needed (nose bleed).) 30 mL 0 Past Week   lamoTRIgine (LAMICTAL) 25 MG tablet Take 1 tablet (25 mg total) by mouth daily. 90 tablet 0 05/14/2023 at pm   linaclotide (LINZESS) 72 MCG capsule TAKE 1 CAPSULE EVERY DAY BEFORE BREAKFAST 90 capsule 0 05/15/2023 at am   metoprolol tartrate (LOPRESSOR) 25 MG tablet TAKE ONE TABLET BY MOUTH TWICE DAILY 180 tablet 1 05/15/2023 at am   modafinil (PROVIGIL) 100 MG tablet Take 1 tablet (100 mg total) by mouth daily. (Patient taking differently: Take 100 mg by mouth daily as needed (sleep disorder).) 30 tablet 5 Past Month   Pitavastatin Calcium 2 MG TABS TAKE ONE TABLET BY MOUTH DAILY 90 tablet 1 05/14/2023 at pm   PRESCRIPTION MEDICATION 1 Dose by Other route at bedtime as needed (sleep). CPAP   >1 month   Propylene Glycol (SYSTANE BALANCE OP) Place 1 drop into both eyes 4 (four) times daily as needed (dry eyes).   UNKNOWN   REXULTI 0.25 MG TABS tablet TAKE 1 TABLET BY MOUTH DAILY 90 tablet 0 05/15/2023 at am   SYMBICORT 80-4.5 MCG/ACT inhaler USE 2 PUFFS TWICE A DAY. (Patient taking differently: 2 puffs 2 (two) times daily.) 30.6 g 5 UNKNOWN   torsemide (DEMADEX) 20 MG tablet Take 1 tablet (20 mg total) by mouth daily. 90 tablet 1 05/15/2023 at am   Vilazodone HCl (VIIBRYD) 40 MG TABS TAKE ONE TABLET BY MOUTH DAILY 90 tablet 1 05/15/2023 at am   warfarin (COUMADIN) 2.5 MG tablet TAKE 1 TABLET BY MOUTH DAILY EXCEPT TAKE 1/2 TABLET ON WEDNESDAYS OR AS DIRECTED BY ANTICOAGULATION CLINIC (Patient taking differently: Take 1.25-2.5 mg by mouth See admin instructions. TAKE 1 TABLET BY MOUTH DAILY EXCEPT TAKE 1/2 TABLET ON MONDAYS OR AS DIRECTED BY ANTICOAGULATION CLINIC) 180 tablet 1 05/14/2023 at 1730   warfarin (COUMADIN) 1 MG tablet Take 1 tablet (1 mg total) by mouth daily. TAKE 1 1/2 TABLET EVERY WEDNESDAY OR AS DIRECTED BY ANTICOAGULATION  [facility]INIC (Patient not taking: Reported on 05/16/2023) 20 tablet 0 Not Taking   Scheduled:   famotidine  40 mg Oral Daily   ferrous sulfate  325 mg Oral BID WC   fluticasone furoate-vilanterol  1 puff  Inhalation Daily   insulin aspart  0-5 Units Subcutaneous QHS   insulin aspart  0-6 Units Subcutaneous TID WC   linaclotide  145 mcg Oral QAC breakfast   modafinil  100 mg Oral Daily   Warfarin - Pharmacist Dosing Inpatient   Does not apply q1600   Infusions:  PRN: acetaminophen **OR** acetaminophen  Assessment: 87 yof with a history of CKD, HFpEF, hypertrophic cardiomyopathy s/p myomectomy (2012), CHB s/p PPM, bioprosthetic TV, AF, HLD, dementia, COPD. Patient is presenting with AMS. Warfarin per pharmacy consult placed for atrial fibrillation.CT Head imaging obtained x 2 for stroke work-up without acute processes. Last anticoagulation appointment 04/17/23 where INR ar goal 2.5. PTA dosing 1.25mg  on Mondays and 2.5 all other days (total weekly 16.25mg ). LD PTA 10/10 at 1730.    INR 3.1 10/11 - warfarin dose held INR 3.5 10/12 (at ~ midnight - a few hours after the 10/11 INR from above) INR 3.7 10/12 AM - warfarin dose held   10/13:  INR therapeutic at higher end (2.9). Will give lower dose than normal PTA regimen since INRs since admission were supratherapeutic. Hgb down 10s, PLTc down 120s   Goal of Therapy:  INR Goal 2-3 Monitor platelets by anticoagulation protocol: Yes   Plan:  Warfarin 1.5 mg x1  Monitor for s/s of hemorrhage, daily INR, CBC Watch for new DDIs F/U MRI on Monday  Verdene Rio, PharmD PGY1 Pharmacy Resident

## 2023-05-18 ENCOUNTER — Other Ambulatory Visit (HOSPITAL_COMMUNITY): Payer: Self-pay

## 2023-05-18 ENCOUNTER — Encounter: Payer: Self-pay | Admitting: Internal Medicine

## 2023-05-18 DIAGNOSIS — R400 Somnolence: Secondary | ICD-10-CM | POA: Diagnosis not present

## 2023-05-18 LAB — CBC
HCT: 31.7 % — ABNORMAL LOW (ref 36.0–46.0)
Hemoglobin: 10 g/dL — ABNORMAL LOW (ref 12.0–15.0)
MCH: 30.1 pg (ref 26.0–34.0)
MCHC: 31.5 g/dL (ref 30.0–36.0)
MCV: 95.5 fL (ref 80.0–100.0)
Platelets: 142 10*3/uL — ABNORMAL LOW (ref 150–400)
RBC: 3.32 MIL/uL — ABNORMAL LOW (ref 3.87–5.11)
RDW: 14.6 % (ref 11.5–15.5)
WBC: 8.3 10*3/uL (ref 4.0–10.5)
nRBC: 0 % (ref 0.0–0.2)

## 2023-05-18 LAB — BASIC METABOLIC PANEL
Anion gap: 10 (ref 5–15)
BUN: 29 mg/dL — ABNORMAL HIGH (ref 8–23)
CO2: 26 mmol/L (ref 22–32)
Calcium: 10.4 mg/dL — ABNORMAL HIGH (ref 8.9–10.3)
Chloride: 99 mmol/L (ref 98–111)
Creatinine, Ser: 1.67 mg/dL — ABNORMAL HIGH (ref 0.44–1.00)
GFR, Estimated: 31 mL/min — ABNORMAL LOW (ref >60)
Glucose, Bld: 114 mg/dL — ABNORMAL HIGH (ref 70–99)
Potassium: 3.8 mmol/L (ref 3.5–5.1)
Sodium: 135 mmol/L (ref 135–145)

## 2023-05-18 LAB — GLUCOSE, CAPILLARY
Glucose-Capillary: 104 mg/dL — ABNORMAL HIGH (ref 70–99)
Glucose-Capillary: 133 mg/dL — ABNORMAL HIGH (ref 70–99)

## 2023-05-18 LAB — PTH, INTACT AND CALCIUM
Calcium, Total (PTH): 9.8 mg/dL (ref 8.7–10.3)
PTH: 27 pg/mL (ref 15–65)

## 2023-05-18 LAB — PROTIME-INR
INR: 2.6 — ABNORMAL HIGH (ref 0.8–1.2)
Prothrombin Time: 28.4 s — ABNORMAL HIGH (ref 11.4–15.2)

## 2023-05-18 MED ORDER — GUAIFENESIN-DM 100-10 MG/5ML PO SYRP
10.0000 mL | ORAL_SOLUTION | Freq: Four times a day (QID) | ORAL | 1 refills | Status: DC | PRN
  Filled 2023-05-18: qty 237, 6d supply, fill #0

## 2023-05-18 MED ORDER — WARFARIN 1.25 MG HALF TABLET
1.2500 mg | ORAL_TABLET | Freq: Once | ORAL | Status: DC
  Filled 2023-05-18: qty 1

## 2023-05-18 MED ORDER — FAMOTIDINE 20 MG PO TABS
20.0000 mg | ORAL_TABLET | Freq: Every day | ORAL | Status: DC
  Administered 2023-05-18: 20 mg via ORAL
  Filled 2023-05-18: qty 1

## 2023-05-18 MED ORDER — ALBUTEROL SULFATE HFA 108 (90 BASE) MCG/ACT IN AERS
2.0000 | INHALATION_SPRAY | Freq: Four times a day (QID) | RESPIRATORY_TRACT | 0 refills | Status: DC | PRN
  Filled 2023-05-18: qty 18, 30d supply, fill #0

## 2023-05-18 NOTE — TOC Initial Note (Addendum)
Transition of Care [facility]) - Initial/Assessment Note    Patient Details  Name: Holly Simmons MRN: [id number] Date of Birth: 01-02-1941  [facility] CM/SW Contact:    Ronny Bacon, RN Phone Number: 05/18/2023, 11:06 AM  Clinical Narrative:   Orders for HH Pt noted. Spoke with patient and family at bedside. Patient did not answer questions, but did observe conversation between case manager and family at bedside.    Patient lives with adult children, they are available 24 hours a day. Patient has Rollator at bedside. Family will transport patient home at discharge. Discussed HH PT agency options and gave family Medicare list for Home Health agency near patient home. [facility] used [facility] in the past but family wants to explore other options. Will follow up with family on choice for home health PT.          1145: Family still trying to decide on what Home health agency to use.  Requested I call back in about 30 mins.   Expected Discharge Plan: Home w Home Health Services Barriers to Discharge: Continued Medical Work up   Patient Goals and CMS Choice            Expected Discharge Plan and Services   Discharge Planning Services: CM Consult   Living arrangements for the past 2 months: Single Family Home Expected Discharge Date: 05/18/23                                    Prior Living Arrangements/Services Living arrangements for the past 2 months: Single Family Home Lives with:: Adult Children Patient language and need for interpreter reviewed:: Yes Do you feel safe going back to the place where you live?: Yes      Need for Family Participation in Patient Care: Yes (Comment) Care giver support system in place?: Yes (comment) Current home services: DME (Rollator) Criminal Activity/Legal Involvement Pertinent to Current Situation/Hospitalization: No - Comment as needed  Activities of Daily Living      Permission Sought/Granted Permission  sought to share information with : Case Manager, Magazine features editor Permission granted to share information with : Yes, Verbal Permission Granted              Emotional Assessment Appearance:: Appears stated age Attitude/Demeanor/Rapport: Engaged (Did not answer questions, family at bedside answered for her.) Affect (typically observed): Calm, Quiet (pt did not engage in verbal conversation) Orientation: :  (pt did not talk) Alcohol / Substance Use: Not Applicable Psych Involvement: No (comment)  Admission diagnosis:  Stroke-like symptoms [R29.90] Encephalopathy, unspecified type [G93.40] Patient Active Problem List   Diagnosis Date Noted   Conjunctival hemorrhage of right eye 05/17/2023   Altered mental status 05/16/2023   COPD (chronic obstructive pulmonary disease) (HCC) 05/16/2023   Hyperglycemia 05/16/2023   Hypercalcemia 05/16/2023   Cellulitis and abscess of upper extremity 02/10/2023   Skin lesion of left arm 02/10/2023   Dementia (HCC) 09/02/2022   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   Acute kidney injury superimposed on chronic kidney disease (HCC) 03/18/2022   Severe episode of recurrent major depressive disorder, without psychotic features (HCC) 02/26/2022   A-fib (HCC) 08/31/2021   Thrombocytopenia (HCC) 12/04/2020   Encounter for general adult medical examination with abnormal findings 07/12/2020   Cachexia (HCC) 08/16/2019   Iron deficiency anemia due to chronic blood loss 09/23/2018   Stage 3b chronic kidney disease (CKD) (HCC) 09/12/2018  Microalbuminuria due to type 2 diabetes mellitus (HCC) 04/21/2018   Obesity (BMI 30.0-34.9) 02/05/2016   Osteoporosis with pathological fracture of ankle and foot 05/30/2015   Encounter for monitoring SBE (subacute bacterial endocarditis) prophylaxis 11/22/2014   Gout 08/20/2012   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 06/10/2012   Other screening mammogram 02/04/2012   Hyperlipidemia LDL goal <130 12/04/2011   VSD  (ventricular septal defect and aortic arch hypoplasia 09/25/2011   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 [facility] [address] [place] [address] Phone: [phone] Fax: [phone]  [facility] [id number] - [place], [place] - [address] [place] [address] Phone: [phone] Fax: [phone]  [facility] - [place], [place] - [address] [place] [address] Phone: [phone] Fax: [phone]  [facility] [address] [place] [address] Phone: [phone] Fax: [phone]     Social Determinants of Health (SDOH) Social History: SDOH Screenings   Food Insecurity: No Food Insecurity (11/18/2022)  Housing: Low Risk  (11/18/2022)  Transportation Needs: No Transportation Needs (11/18/2022)  Utilities: Patient Declined (10/06/2022)  Alcohol Screen: Low Risk  (10/06/2022)  Depression (PHQ2-9): Medium Risk (10/06/2022)  Financial Resource Strain: Low Risk  (11/18/2022)  Physical Activity: Inactive (11/18/2022)  Social Connections: Moderately Integrated (11/18/2022)  Stress: No Stress Concern Present (11/18/2022)  Tobacco Use: Low Risk  (05/15/2023)   SDOH Interventions:     Readmission Risk Interventions     No data to display

## 2023-05-18 NOTE — Progress Notes (Signed)
Physical Therapy Treatment  Patient Details Name: Holly Simmons MRN: [id number] DOB: Apr 20, 1941 Today's Date: 05/18/2023   History of Present Illness 82 y.o. female presented 05/15/23 to the ED for evaluation of confusion and acute onset left-sided weakness. CT head negative and symptoms resolved. Neuro cancelled code stroke; +encephalopathy, AKI, MRI brain pending (has PPM); 2nd CT head 10/12 negative  PMH significant of CKD stage IIIb, chronic HFpEF, hypertrophic cardiomyopathy status post myomectomy in 2012, CHB status post PPM, bioprosthetic TV, atrial fibrillation on Coumadin, hyperlipidemia, dementia, COPD    PT Comments  Pt progressing towards physical therapy goals. Was able to perform transfers and ambulation with gross CGA and rollator for support. Frequent cues for safety as brakes on pt's rollator are not functional. Daughter reports someone is always with her when she is transferring and they typically hold the rollator in place for the pt. Session limited by diarrhea, and pt required increased assist for managing briefs and performing hygiene. Pt continues to fatigue quickly with functional activity and we were not able to ambulate in the hallway today. Recommendation for HHPT remains appropriate at d/c. Acutely, will continue to follow and progress as able per POC.    If plan is discharge home, recommend the following: A little help with walking and/or transfers;A little help with bathing/dressing/bathroom;Assistance with cooking/housework;Direct supervision/assist for medications management;Direct supervision/assist for financial management;Assist for transportation;Help with stairs or ramp for entrance;Supervision due to cognitive status   Can travel by private vehicle        Equipment Recommendations  None recommended by PT    Recommendations for Other Services OT consult     Precautions / Restrictions Precautions Precautions: Fall Restrictions Weight Bearing  Restrictions: No     Mobility  Bed Mobility Overal bed mobility: Needs Assistance Bed Mobility: Supine to Sit, Sit to Supine     Supine to sit: Contact guard, HOB elevated, Used rails Sit to supine: Contact guard assist   General bed mobility comments: Close guard for traunk elevation to full sitting position. Pt was able to transition back to supine without assistance.    Transfers Overall transfer level: Needs assistance Equipment used: Rolling walker (2 wheels) Transfers: Sit to/from Stand Sit to Stand: Contact guard assist           General transfer comment: VC's for hand placement on seated surface for safety. Pt's rollator present in room and noted brakes are not functional. Therapist blocked rollator from moving as pt powered up to full stand.    Ambulation/Gait Ambulation/Gait assistance: Contact guard assist Gait Distance (Feet): 25 Feet Assistive device: Rolling walker (2 wheels) Gait Pattern/deviations: Step-through pattern, Decreased step length - right, Decreased step length - left, Trunk flexed, Narrow base of support Gait velocity: Decreased Gait velocity interpretation: 1.31 - 2.62 ft/sec, indicative of limited community ambulator   General Gait Details: Pt ambulated to/from the bathroom - about 25' total. Pt fatigued after bathroom trip and declined further ambulation.   Stairs             Wheelchair Mobility     Tilt Bed    Modified Rankin (Stroke Patients Only)       Balance Overall balance assessment: Needs assistance Sitting-balance support: No upper extremity supported, Feet supported Sitting balance-Leahy Scale: Good     Standing balance support: No upper extremity supported, During functional activity Standing balance-Leahy Scale: Fair Standing balance comment: standing at sink  Cognition Arousal: Alert Behavior During Therapy: Flat affect Overall Cognitive Status: History of cognitive  impairments - at baseline                                          Exercises      General Comments        Pertinent Vitals/Pain Pain Assessment Pain Assessment: Faces Faces Pain Scale: No hurt Pain Intervention(s): Monitored during session    Home Living                          Prior Function            PT Goals (current goals can now be found in the care plan section) Acute Rehab PT Goals Patient Stated Goal: unable to state PT Goal Formulation: With patient/family Time For Goal Achievement: 05/31/23 Potential to Achieve Goals: Good Progress towards PT goals: Progressing toward goals    Frequency    Min 1X/week      PT Plan      Co-evaluation              AM-PAC PT "6 Clicks" Mobility   Outcome Measure  Help needed turning from your back to your side while in a flat bed without using bedrails?: A Little Help needed moving from lying on your back to sitting on the side of a flat bed without using bedrails?: A Little Help needed moving to and from a bed to a chair (including a wheelchair)?: A Little Help needed standing up from a chair using your arms (e.g., wheelchair or bedside chair)?: A Little Help needed to walk in hospital room?: A Little Help needed climbing 3-5 steps with a railing? : A Lot 6 Click Score: 17    End of Session Equipment Utilized During Treatment: Gait belt Activity Tolerance: Patient limited by fatigue Patient left: in bed;with call bell/phone within reach;with bed alarm set;with family/visitor present Nurse Communication: Mobility status;Other (comment) (Diarrhea) PT Visit Diagnosis: Other abnormalities of gait and mobility (R26.89);Muscle weakness (generalized) (M62.81)     Time: 1610-9604 PT Time Calculation (min) (ACUTE ONLY): 35 min  Charges:    $Gait Training: 8-22 mins $Therapeutic Activity: 8-22 mins PT General Charges $$ ACUTE PT VISIT: 1 Visit                     Conni Slipper, PT, DPT Acute Rehabilitation Services Secure Chat Preferred Office: [phone]    Holly Simmons 05/18/2023, 1:14 PM

## 2023-05-18 NOTE — Plan of Care (Signed)
  Problem: Coping: Goal: Ability to adjust to condition or change in health will improve Outcome: Progressing   Problem: Metabolic: Goal: Ability to maintain appropriate glucose levels will improve Outcome: Progressing   Problem: Nutritional: Goal: Maintenance of adequate nutrition will improve Outcome: Progressing   Problem: Tissue Perfusion: Goal: Adequacy of tissue perfusion will improve Outcome: Progressing   Problem: Nutrition: Goal: Adequate nutrition will be maintained Outcome: Progressing

## 2023-05-18 NOTE — Progress Notes (Signed)
Discharge instructions given. Patient verbalized understanding and all questions were answered.  ?

## 2023-05-18 NOTE — Progress Notes (Signed)
PHARMACY - ANTICOAGULATION CONSULT NOTE  Pharmacy Consult for Warfarin Indication: atrial fibrillation  Allergies  Allergen Reactions   Petrolatum-Zinc Oxide Anaphylaxis, Rash and Swelling   Sulfa Antibiotics Rash   Sulfonamide Derivatives Anaphylaxis and Swelling   Tetanus Toxoid     Other reaction(s): Other (See Comments) OTHER REACTION   Tetanus Toxoids Swelling   Other Rash and Other (See Comments)    STERI - STRIPS  It can affect proteins in her kidneys so she doesn't take REACTION: proteinuria Other reaction(s): Other (See Comments), Unknown It can affect proteins in her kidneys so she doesn't take   Nsaids Other (See Comments)    REACTION: proteinuria Other reaction(s): Other (See Comments), Unknown It can affect proteins in her kidneys so she doesn't take    Tetanus Toxoid, Adsorbed Other (See Comments)    Other reaction(s): Other (See Comments) Turned arm red Turned arm red    Patient Measurements: Height: 5\' 3"  (160 cm) Weight: 77.1 kg (170 lb) IBW/kg (Calculated) : 52.4  Vital Signs: Temp: 98.3 F (36.8 C) (10/14 0842) Temp Source: Oral (10/14 0842) BP: 141/47 (10/14 0842) Pulse Rate: 61 (10/14 0842)  Labs: Recent Labs    05/15/23 1742 05/15/23 1748 05/16/23 0020 05/16/23 0040 05/16/23 0103 05/16/23 0818 05/16/23 1155 05/17/23 1116 05/18/23 0425  HGB 11.2*   < >  --   --  10.5*  --   --  10.7* 10.0*  HCT 35.8*   < >  --   --  33.4*  --   --  33.3* 31.7*  PLT 173  --   --   --  123*  --   --  127* 142*  APTT 44*  --   --   --   --   --   --   --   --   LABPROT 32.2*  --   --    < >  --   --  36.8* 30.6* 28.4*  INR 3.1*  --   --    < >  --   --  3.7* 2.9* 2.6*  CREATININE  --    < > 2.19*  --   --   --   --  1.87* 1.67*  TROPONINIHS  --   --   --   --   --  38* 29*  --   --    < > = values in this interval not displayed.    Estimated Creatinine Clearance: 26.9 mL/min (A) (by C-G formula based on SCr of 1.67 mg/dL (H)).   Medical  History: Past Medical History:  Diagnosis Date   Allergic rhinitis    Asthma    NOS w/ acute exacerbation   Blood transfusion without reported diagnosis    CKD (chronic kidney disease) stage 3, GFR 30-59 ml/min (HCC) 09/12/2018   Colon polyps    TUBULAR ADENOMAS AND HYPERPLASTIC   Complete heart block (HCC)    requiring PPM (MDT) post surgical myomectomy at [facility],  leads are epicardial with abdominal implant, high ventricular threshold at implant   COPD (chronic obstructive pulmonary disease) (HCC)    Depressive disorder    Diastolic dysfunction    DM (diabetes mellitus) (HCC)    Gallstones    GERD (gastroesophageal reflux disease)    Gout 08/20/2012   Heart murmur    Hyperpotassemia    Hypersomnia    Hypertension    Hypertrophic cardiomyopathy (HCC)    s/p surgical myomectomy at 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 Stroke workup completed and ruled out. Warfarin per pharmacy consult placed for atrial fibrillation.  Per PTA 9/13 [facility] clinic, warfarin dosing was 1.25mg  on Mondays and 2.5 all other days (total weekly 16.25mg ). LD PTA 10/10 at 1730.    10/11 INR 3.1 warfarin dose held 10/12 INR 3.7 warfarin dose held  10/13 INR 2.9 warfarin 1.5 mg x1  10/14 INR 2.6 warfarin 1.25 mg x1   Goal of Therapy:  INR 2-3 Monitor platelets by anticoagulation protocol: Yes   Plan:  Give warfarin 1.25 mg PO x1 dose Check INR daily while on warfarin Continue to monitor H&H and  platelets  Thank you for allowing pharmacy to be a part of this patient's care.  Thelma Barge, PharmD Clinical Pharmacist

## 2023-05-18 NOTE — TOC Transition Note (Signed)
Transition of Care [facility]) - CM/SW Discharge Note   Patient Details  Name: Holly Simmons MRN: [id number] Date of Birth: [birth year]  Transition of Care [facility]) CM/SW Contact:  Ronny Bacon, RN Phone Number: 05/18/2023, 1:18 PM   Clinical Narrative:  Patient is being discharged today. HH PT arranged through [facility].      Final next level of care: Home w Home Health Services Barriers to Discharge: No Barriers Identified   Patient Goals and CMS Choice      Discharge Placement                         Discharge Plan and Services Additional resources added to the After Visit Summary for     Discharge Planning Services: CM Consult                      HH Arranged: PT [facility] Agency: [facility] Date [facility] Agency Contacted: 05/18/23 Time HH Agency Contacted: 1302 (Accepted at 1314) Representative spoke with at [facility] Agency: [facility]  Social Determinants of Health (SDOH) Interventions SDOH Screenings   Food Insecurity: No Food Insecurity (11/18/2022)  Housing: Low Risk  (11/18/2022)  Transportation Needs: No Transportation Needs (11/18/2022)  Utilities: Patient Declined (10/06/2022)  Alcohol Screen: Low Risk  (10/06/2022)  Depression (PHQ2-9): Medium Risk (10/06/2022)  Financial Resource Strain: Low Risk  (11/18/2022)  Physical Activity: Inactive (11/18/2022)  Social Connections: Moderately Integrated (11/18/2022)  Stress: No Stress Concern Present (11/18/2022)  Tobacco Use: Low Risk  (05/15/2023)     Readmission Risk Interventions     No data to display

## 2023-05-18 NOTE — Discharge Summary (Signed)
Physician Discharge Summary  NAJEE BEARDMORE OZH:[id number] DOB: 07-Apr-1941 DOA: 05/15/2023  PCP: Etta Grandchild, MD  Admit date: 05/15/2023 Discharge date: 05/18/2023  Admitted From: Home Disposition:  Home  Discharge Condition:Stable CODE STATUS:FULL Diet recommendation: Heart Healthy   Brief/Interim Summary: TEMIA JENKINS is a 82 y.o. female with medical history significant of CKD stage IIIb, chronic HFpEF, hypertrophic cardiomyopathy status post myomectomy in 2012, CHB status post PPM, bioprosthetic TV, atrial fibrillation on Coumadin, hyperlipidemia, dementia, COPD presented to the ED as code stroke from urgent care for evaluation of confusion and acute onset left-sided weakness .  Stroke was suspected on admission.  CT head was negative for acute findings.  Neurology also consulted.  Her presentation was mostly consistent with toxic metabolic encephalopathy in the setting of dementia rather than a stroke.  MRI not pursued.  Currently her mentation has returned to baseline.  Neurology has signed off.  Medically stable for discharge to home today.  PT recommend home health.  Following problems were addressed during the hospitalization:  Acute metabolic encephalopathy: Presented with confusion, left-sided weakness.  CT head negative for acute findings.Her presentation was mostly consistent with toxic metabolic encephalopathy in the setting of dementia rather than a stroke.  MRI not pursued.  Currently her mentation has returned to baseline.  Neurology has signed off.   Wellbutrin will be continued because her kidney function has returned to baseline.  EEG did not show seizures.  Takes Lamictal at home which will be continued.  AKI on CKD stage IIIb: Baseline creatinine is around 1.9.  Currently kidney function at baseline.  Hypercalcemia: Resolved with IV fluid  Right conjunctival hemorrhage: Does not have effect on her vision from baseline.  No pain or purulent discharge.   We recommend supportive care.  She has an appointment with her ophthalmologist later this week.  History of COPD: Has some cough.  On room air.  Continue home bronchodilators.  Continue Mucinex  Paroxysmal A-fib: Paced rhythm.  Takes warfarin for anticoagulation.  Continue metoprolol for rate control  Chronic diastolic CHF: Currently appears euvolemic.  Takes torsemide at home which will continue  Debility/deconditioning: PT recommend home as an discharge   Discharge Diagnoses:  Principal Problem:   Altered mental status Active Problems:   Acute kidney injury superimposed on chronic kidney disease (HCC)   Hypercalcemia   Chronic heart failure with preserved ejection fraction (HFpEF) (HCC)   A-fib (HCC)   COPD (chronic obstructive pulmonary disease) (HCC)   Hyperglycemia   Conjunctival hemorrhage of right eye    Discharge Instructions  Discharge Instructions     Diet - low sodium heart healthy   Complete by: As directed    Discharge instructions   Complete by: As directed    1)Please follow up with your PCP in a week 2)Follow up with your ophthalmologist later this week. 3)Continue your medications as instructed   Increase activity slowly   Complete by: As directed       Allergies as of 05/18/2023       Reactions   Petrolatum-zinc Oxide Anaphylaxis, Rash, Swelling   Sulfa Antibiotics Rash   Sulfonamide Derivatives Anaphylaxis, Swelling   Tetanus Toxoid    Other reaction(s): Other (See Comments) OTHER REACTION   Tetanus Toxoids Swelling   Other Rash, Other (See Comments)   STERI - STRIPS  It can affect proteins in her kidneys so she doesn't take REACTION: proteinuria Other reaction(s): Other (See Comments), Unknown It can affect proteins in her kidneys so she  doesn't take   Nsaids Other (See Comments)   REACTION: proteinuria Other reaction(s): Other (See Comments), Unknown It can affect proteins in her kidneys so she doesn't take   Tetanus Toxoid, Adsorbed  Other (See Comments)   Other reaction(s): Other (See Comments) Turned arm red Turned arm red        Medication List     TAKE these medications    albuterol 108 (90 Base) MCG/ACT inhaler Commonly known as: VENTOLIN HFA Inhale 2 puffs into the lungs every 6 (six) hours as needed for wheezing or shortness of breath.   allopurinol 100 MG tablet Commonly known as: ZYLOPRIM TAKE ONE TABLET BY MOUTH DAILY   Betamethasone Valerate 0.12 % foam Apply 1 application topically daily. What changed: when to take this   buPROPion 300 MG 24 hr tablet Commonly known as: WELLBUTRIN XL Take 1 tablet (300 mg total) by mouth daily.   famotidine 40 MG tablet Commonly known as: PEPCID TAKE ONE TABLET BY MOUTH DAILY   ferrous sulfate 325 (65 FE) MG tablet Take 1 tablet (325 mg total) by mouth 2 (two) times daily with a meal. What changed: when to take this   guaiFENesin-dextromethorphan 100-10 MG/5ML syrup Commonly known as: ROBITUSSIN DM Take 10 mLs by mouth every 6 (six) hours as needed for cough.   ipratropium 0.03 % nasal spray Commonly known as: ATROVENT USE 2 SPRAYS IN EACH NOSTRIL 3 TIMES A DAY AS NEEDED. What changed: See the new instructions.   lamoTRIgine 25 MG tablet Commonly known as: LAMICTAL Take 1 tablet (25 mg total) by mouth daily.   Linzess 72 MCG capsule Generic drug: linaclotide TAKE 1 CAPSULE EVERY DAY BEFORE BREAKFAST   metoprolol tartrate 25 MG tablet Commonly known as: LOPRESSOR TAKE ONE TABLET BY MOUTH TWICE DAILY   modafinil 100 MG tablet Commonly known as: PROVIGIL Take 1 tablet (100 mg total) by mouth daily. What changed:  when to take this reasons to take this   Pitavastatin Calcium 2 MG Tabs TAKE ONE TABLET BY MOUTH DAILY   PRESCRIPTION MEDICATION 1 Dose by Other route at bedtime as needed (sleep). CPAP   Rexulti 0.25 MG Tabs tablet Generic drug: brexpiprazole TAKE 1 TABLET BY MOUTH DAILY   Symbicort 80-4.5 MCG/ACT inhaler Generic drug:  budesonide-formoterol USE 2 PUFFS TWICE A DAY. What changed: See the new instructions.   SYSTANE BALANCE OP Place 1 drop into both eyes 4 (four) times daily as needed (dry eyes).   torsemide 20 MG tablet Commonly known as: DEMADEX Take 1 tablet (20 mg total) by mouth daily.   Vilazodone HCl 40 MG Tabs Commonly known as: VIIBRYD TAKE ONE TABLET BY MOUTH DAILY   warfarin 1 MG tablet Commonly known as: COUMADIN Take as directed. If you are unsure how to take this medication, talk to your nurse or doctor. Original instructions: Take 1 tablet (1 mg total) by mouth daily. TAKE 1 1/2 TABLET EVERY WEDNESDAY OR AS DIRECTED BY ANTICOAGULATION LCINIC What changed: Another medication with the same name was changed. Make sure you understand how and when to take each.   warfarin 2.5 MG tablet Commonly known as: COUMADIN Take as directed. If you are unsure how to take this medication, talk to your nurse or doctor. Original instructions: TAKE 1 TABLET BY MOUTH DAILY EXCEPT TAKE 1/2 TABLET ON WEDNESDAYS OR AS DIRECTED BY ANTICOAGULATION CLINIC What changed:  how much to take how to take this when to take this additional instructions  Follow-up Information     Etta Grandchild, MD. Schedule an appointment as soon as possible for a visit in 1 week(s).   Specialty: Internal Medicine Contact information: [address] [place] [address] [phone]                Allergies  Allergen Reactions   Petrolatum-Zinc Oxide Anaphylaxis, Rash and Swelling   Sulfa Antibiotics Rash   Sulfonamide Derivatives Anaphylaxis and Swelling   Tetanus Toxoid     Other reaction(s): Other (See Comments) OTHER REACTION   Tetanus Toxoids Swelling   Other Rash and Other (See Comments)    STERI - STRIPS  It can affect proteins in her kidneys so she doesn't take REACTION: proteinuria Other reaction(s): Other (See Comments), Unknown It can affect proteins in her kidneys so she doesn't  take   Nsaids Other (See Comments)    REACTION: proteinuria Other reaction(s): Other (See Comments), Unknown It can affect proteins in her kidneys so she doesn't take    Tetanus Toxoid, Adsorbed Other (See Comments)    Other reaction(s): Other (See Comments) Turned arm red Turned arm red    Consultations: Neurology   Procedures/Studies: EEG adult  Result Date: 06/04/2023 Charlsie Quest, MD     June 04, 2023 12:39 PM Patient Name: ANADIA KURDI MRN: [id number] Epilepsy Attending: Charlsie Quest Referring Physician/Provider: Arnetha Courser, MD Date: Jun 04, 2023 Duration: 24.42 mins Patient history: 82yo F with acute onset of ams and left sided weakness. EEG to evaluate for seizure Level of alertness: Awake, asleep AEDs during EEG study: None Technical aspects: This EEG study was done with scalp electrodes positioned according to the 10-20 International system of electrode placement. Electrical activity was reviewed with band pass filter of 1-70Hz , sensitivity of 7 uV/mm, display speed of 66mm/sec with a 60Hz  notched filter applied as appropriate. EEG data were recorded continuously and digitally stored.  Video monitoring was available and reviewed as appropriate. Description: The posterior dominant rhythm consists of 7 Hz activity of moderate voltage (25-35 uV) seen predominantly in posterior head regions, symmetric and reactive to eye opening and eye closing. Sleep was characterized by vertex waves, sleep spindles (12 to 14 Hz), maximal frontocentral region. EEG showed continuous generalized  3 to 6 Hz theta-delta slowing. Physiologic photic driving was not seen during photic stimulation.  Hyperventilation was not performed.   ABNORMALITY - Continuous slow, generalized IMPRESSION: This study is suggestive of mild to moderate diffuse encephalopathy. No seizures or epileptiform discharges were seen throughout the recording. Priyanka [name]   CT HEAD WO CONTRAST ( )  Result Date:  06/04/2023 CLINICAL DATA:  82 year old female dragging left leg, left side weakness. Code stroke presentation yesterday. Subsequent encounter. EXAM: CT HEAD WITHOUT CONTRAST TECHNIQUE: Contiguous axial images were obtained from the base of the skull through the vertex without intravenous contrast. RADIATION DOSE REDUCTION: This exam was performed according to the departmental dose-optimization program which includes automated exposure control, adjustment of the mA and/or kV according to patient size and/or use of iterative reconstruction technique. COMPARISON:  Head CT 05/15/2023. FINDINGS: Brain: Stable non contrast CT appearance of the brain. Normal cerebral volume for age. Patchy bilateral white matter hypodensity. No midline shift, ventriculomegaly, mass effect, evidence of mass lesion, intracranial hemorrhage or evidence of cortically based acute infarction. Vascular: Resolved small volume intravenous gas seen yesterday, likely was IV access related. Calcified atherosclerosis at the skull base. No suspicious intracranial vascular hyperdensity. Skull: No acute osseous abnormality identified. Sinuses/Orbits: Mild paranasal sinus mucosal thickening is  stable. Tympanic cavities and mastoids appear clear. Other: No acute orbit or scalp soft tissue finding. IMPRESSION: Stable non contrast CT appearance of cerebral white matter disease. No acute intracranial abnormality. Electronically Signed   By: Odessa Fleming M.D.   On: 05/16/2023 07:18   DG Chest Portable 1 View  Result Date: 05/15/2023 CLINICAL DATA:  Cough EXAM: PORTABLE CHEST 1 VIEW COMPARISON:  Radiograph 07/01/2022 FINDINGS: Left chest wall CRT-P. Sternotomy and CABG. Epicardial pacing wires. Stable cardiomediastinal silhouette. Aortic atherosclerotic calcification. Low lung volumes accentuate pulmonary vascularity. No pleural effusion or pneumothorax. IMPRESSION: Low lung volumes.  No active disease. Electronically Signed   By: Minerva Fester M.D.   On:  05/15/2023 21:57   CT HEAD CODE STROKE WO CONTRAST  Result Date: 05/15/2023 CLINICAL DATA:  Code stroke. Neuro deficit, acute, stroke suspected. Left-sided weakness. EXAM: CT HEAD WITHOUT CONTRAST TECHNIQUE: Contiguous axial images were obtained from the base of the skull through the vertex without intravenous contrast. RADIATION DOSE REDUCTION: This exam was performed according to the departmental dose-optimization program which includes automated exposure control, adjustment of the mA and/or kV according to patient size and/or use of iterative reconstruction technique. COMPARISON:  Head CT 03/19/2022. FINDINGS: Brain: No acute hemorrhage. Unchanged moderate chronic small-vessel disease. Cortical gray-white differentiation is otherwise preserved. Prominence of the ventricles and sulci within expected range for age. No hydrocephalus or extra-axial collection. No mass effect or midline shift. Vascular: No hyperdense vessel or unexpected calcification. Skull: No calvarial fracture or suspicious bone lesion. Skull base is unremarkable. Sinuses/Orbits: No acute finding. Other: None. ASPECTS [facility] Stroke Program Early CT Score) - Ganglionic level infarction (caudate, lentiform nuclei, internal capsule, insula, M1-M3 cortex): 7 - Supraganglionic infarction (M4-M6 cortex): 3 Total score (0-10 with 10 being normal): 10 IMPRESSION: 1. No acute intracranial hemorrhage or evidence of acute large vessel territory infarct. ASPECT score is 10. 2. Stable moderate chronic small-vessel disease. Electronically Signed   By: Orvan Falconer M.D.   On: 05/15/2023 18:00      Subjective: Patient seen and examined at bedside today.  Hemodynamically stable.  Appears very comfortable.  Lying in bed.  Playing with her iPad.  Son and daughter at bedside.  Denies any new complaints today.  We discussed about discharge planning at bedside.  Discharge Exam: Vitals:   05/18/23 0800 05/18/23 0842  BP:  (!) 141/47  Pulse:  61   Resp:  18  Temp:  98.3 F (36.8 C)  SpO2: 92% 92%   Vitals:   05/18/23 0024 05/18/23 0414 05/18/23 0800 05/18/23 0842  BP: (!) 163/57 (!) 149/48  (!) 141/47  Pulse: 62 64  61  Resp: 19 16  18   Temp: 98 F (36.7 C) 98.1 F (36.7 C)  98.3 F (36.8 C)  TempSrc: Oral Oral  Oral  SpO2: 93% 91% 92% 92%  Weight:      Height:        General: Pt is alert, awake, not in acute distress, pleasant elderly female, deconditioned, left subconjunctival hemorrhage Cardiovascular: RRR, S1/S2 +, no rubs, no gallops Respiratory: Few crackles on left base, no wheezing, no rhonchi Abdominal: Soft, NT, ND, bowel sounds + Extremities: no edema, no cyanosis    The results of significant diagnostics from this hospitalization (including imaging, microbiology, ancillary and laboratory) are listed below for reference.     Microbiology: Recent Results (from the past 240 hour(s))  SARS Coronavirus 2 by RT PCR (hospital order, performed in [facility] hospital lab) *cepheid single result test*  Anterior Nasal Swab     Status: None   Collection Time: 05/15/23  7:35 PM   Specimen: Anterior Nasal Swab  Result Value Ref Range Status   SARS Coronavirus 2 by RT PCR NEGATIVE NEGATIVE Final    Comment: Performed at [facility], [address], [place], [place] [address]     Labs: BNP (last 3 results) Recent Labs    05/16/23 0020  BNP 578.9*   Basic Metabolic Panel: Recent Labs  Lab 05/15/23 1748 05/15/23 1828 05/16/23 0020 05/17/23 1116 05/18/23 0425  NA 135 138 136 136 135  K 5.7* 4.0 3.8 4.0 3.8  CL 95* 94* 98 100 99  CO2  --  30 25 27 26   GLUCOSE 264* 239* 124* 153* 114*  BUN 51* 32* 31* 32* 29*  CREATININE 2.60* 2.47* 2.19* 1.87* 1.67*  CALCIUM  --  10.9* 9.9  9.8 10.4* 10.4*  PHOS  --   --   --  3.0  --    Liver Function Tests: Recent Labs  Lab 05/15/23 1828 05/16/23 0020 05/17/23 1116  AST 21 21  --   ALT 14 12  --   ALKPHOS 176* 145*  --   BILITOT 1.5* 1.6*  --    PROT 7.3 6.3*  --   ALBUMIN 3.8 3.2* 3.2*   No results for input(s): "LIPASE", "AMYLASE" in the last 168 hours. Recent Labs  Lab 05/16/23 0021  AMMONIA 31   CBC: Recent Labs  Lab 05/15/23 1742 05/15/23 1748 05/16/23 0103 05/17/23 1116 05/18/23 0425  WBC 12.3*  --  14.6* 9.9 8.3  NEUTROABS 10.5*  --   --   --   --   HGB 11.2* 12.9 10.5* 10.7* 10.0*  HCT 35.8* 38.0 33.4* 33.3* 31.7*  MCV 96.5  --  95.4 96.2 95.5  PLT 173  --  123* 127* 142*   Cardiac Enzymes: No results for input(s): "CKTOTAL", "CKMB", "CKMBINDEX", "TROPONINI" in the last 168 hours. BNP: Invalid input(s): "POCBNP" CBG: Recent Labs  Lab 05/17/23 0800 05/17/23 1216 05/17/23 1608 05/17/23 2120 05/18/23 0617  GLUCAP 121* 130* 151* 156* 104*   D-Dimer No results for input(s): "DDIMER" in the last 72 hours. Hgb A1c Recent Labs    05/16/23 0103  HGBA1C 6.6*   Lipid Profile No results for input(s): "CHOL", "HDL", "LDLCALC", "TRIG", "CHOLHDL", "LDLDIRECT" in the last 72 hours. Thyroid function studies Recent Labs    05/16/23 0023  TSH 1.578   Anemia work up Recent Labs    05/16/23 0023  VITAMINB12 370   Urinalysis    Component Value Date/Time   COLORURINE YELLOW 05/15/2023 1935   APPEARANCEUR CLEAR 05/15/2023 1935   LABSPEC 1.009 05/15/2023 1935   PHURINE 5.0 05/15/2023 1935   GLUCOSEU 50 (A) 05/15/2023 1935   GLUCOSEU NEGATIVE 07/01/2022 1503   HGBUR NEGATIVE 05/15/2023 1935   BILIRUBINUR NEGATIVE 05/15/2023 1935   BILIRUBINUR neg 05/14/2012 1053   KETONESUR NEGATIVE 05/15/2023 1935   PROTEINUR 30 (A) 05/15/2023 1935   UROBILINOGEN 1.0 07/01/2022 1503   NITRITE NEGATIVE 05/15/2023 1935   LEUKOCYTESUR NEGATIVE 05/15/2023 1935   Sepsis Labs Recent Labs  Lab 05/15/23 1742 05/16/23 0103 05/17/23 1116 05/18/23 0425  WBC 12.3* 14.6* 9.9 8.3   Microbiology Recent Results (from the past 240 hour(s))  SARS Coronavirus 2 by RT PCR (hospital order, performed in [facility]  hospital lab) *cepheid single result test* Anterior Nasal Swab     Status: None   Collection Time: 05/15/23  7:35 PM   Specimen: Anterior Nasal Swab  Result Value Ref Range Status   SARS Coronavirus 2 by RT PCR NEGATIVE NEGATIVE Final    Comment: Performed at [facility], [address] [place], [place] [address]    Please note: You were cared for by a hospitalist during your hospital stay. Once you are discharged, your primary care physician will handle any further medical issues. Please note that NO REFILLS for any discharge medications will be authorized once you are discharged, as it is imperative that you return to your primary care physician (or establish a relationship with a primary care physician if you do not have one) for your post hospital discharge needs so that they can reassess your need for medications and monitor your lab values.    Time coordinating discharge: 40 minutes  SIGNED:   Burnadette Pop, MD  [facility] 05/18/2023, 11:06 AM Pager [phone]  If 7PM-7AM, please contact night-coverage [email or web address] Password [email or web address]

## 2023-05-19 ENCOUNTER — Telehealth: Payer: Self-pay | Admitting: Internal Medicine

## 2023-05-19 DIAGNOSIS — G9341 Metabolic encephalopathy: Secondary | ICD-10-CM | POA: Diagnosis not present

## 2023-05-19 DIAGNOSIS — J449 Chronic obstructive pulmonary disease, unspecified: Secondary | ICD-10-CM | POA: Diagnosis not present

## 2023-05-19 DIAGNOSIS — I5032 Chronic diastolic (congestive) heart failure: Secondary | ICD-10-CM | POA: Diagnosis not present

## 2023-05-19 DIAGNOSIS — E1165 Type 2 diabetes mellitus with hyperglycemia: Secondary | ICD-10-CM | POA: Diagnosis not present

## 2023-05-19 DIAGNOSIS — E1122 Type 2 diabetes mellitus with diabetic chronic kidney disease: Secondary | ICD-10-CM | POA: Diagnosis not present

## 2023-05-19 DIAGNOSIS — N1832 Chronic kidney disease, stage 3b: Secondary | ICD-10-CM | POA: Diagnosis not present

## 2023-05-19 DIAGNOSIS — I480 Paroxysmal atrial fibrillation: Secondary | ICD-10-CM | POA: Diagnosis not present

## 2023-05-19 DIAGNOSIS — Z7901 Long term (current) use of anticoagulants: Secondary | ICD-10-CM | POA: Diagnosis not present

## 2023-05-19 NOTE — Telephone Encounter (Signed)
Pt's son, Perlie Gold, reports pt was in hospital for dehydration from 10/11-10/14. Pt had therapeutic INR yesterday at 2.6. Advised to keep coumadin clinic apt for 10/25 and if any changes to contact the clinic. Pt also has OV with PCP on 10/23 and INR could be drawn at that apt. Advised if INR is drawn at that apt this nurse will f/u with dosing instructions after result is received. Perlie Gold verbalized understanding.

## 2023-05-19 NOTE — Telephone Encounter (Signed)
[employer]:  Tamela Oddi with [facility]   Phone Number:  [phone]   Needs Verbal orders for what service & frequency:  PT 2 times a week for 4 weeks, then 1 time a week for 1 week

## 2023-05-20 NOTE — Telephone Encounter (Signed)
Verbal orders given to [facility] as requested

## 2023-05-21 DIAGNOSIS — I480 Paroxysmal atrial fibrillation: Secondary | ICD-10-CM | POA: Diagnosis not present

## 2023-05-21 DIAGNOSIS — N1832 Chronic kidney disease, stage 3b: Secondary | ICD-10-CM | POA: Diagnosis not present

## 2023-05-21 DIAGNOSIS — E119 Type 2 diabetes mellitus without complications: Secondary | ICD-10-CM | POA: Diagnosis not present

## 2023-05-21 DIAGNOSIS — I5032 Chronic diastolic (congestive) heart failure: Secondary | ICD-10-CM | POA: Diagnosis not present

## 2023-05-21 DIAGNOSIS — I679 Cerebrovascular disease, unspecified: Secondary | ICD-10-CM | POA: Diagnosis not present

## 2023-05-21 DIAGNOSIS — G9341 Metabolic encephalopathy: Secondary | ICD-10-CM | POA: Diagnosis not present

## 2023-05-21 DIAGNOSIS — E1165 Type 2 diabetes mellitus with hyperglycemia: Secondary | ICD-10-CM | POA: Diagnosis not present

## 2023-05-21 DIAGNOSIS — E1122 Type 2 diabetes mellitus with diabetic chronic kidney disease: Secondary | ICD-10-CM | POA: Diagnosis not present

## 2023-05-21 DIAGNOSIS — J449 Chronic obstructive pulmonary disease, unspecified: Secondary | ICD-10-CM | POA: Diagnosis not present

## 2023-05-21 DIAGNOSIS — H524 Presbyopia: Secondary | ICD-10-CM | POA: Diagnosis not present

## 2023-05-21 DIAGNOSIS — Z7901 Long term (current) use of anticoagulants: Secondary | ICD-10-CM | POA: Diagnosis not present

## 2023-05-21 LAB — HM DIABETES EYE EXAM

## 2023-05-26 DIAGNOSIS — N1832 Chronic kidney disease, stage 3b: Secondary | ICD-10-CM | POA: Diagnosis not present

## 2023-05-26 DIAGNOSIS — G9341 Metabolic encephalopathy: Secondary | ICD-10-CM | POA: Diagnosis not present

## 2023-05-26 DIAGNOSIS — E1122 Type 2 diabetes mellitus with diabetic chronic kidney disease: Secondary | ICD-10-CM | POA: Diagnosis not present

## 2023-05-26 DIAGNOSIS — Z7901 Long term (current) use of anticoagulants: Secondary | ICD-10-CM | POA: Diagnosis not present

## 2023-05-26 DIAGNOSIS — E1165 Type 2 diabetes mellitus with hyperglycemia: Secondary | ICD-10-CM | POA: Diagnosis not present

## 2023-05-26 DIAGNOSIS — I480 Paroxysmal atrial fibrillation: Secondary | ICD-10-CM | POA: Diagnosis not present

## 2023-05-26 DIAGNOSIS — I5032 Chronic diastolic (congestive) heart failure: Secondary | ICD-10-CM | POA: Diagnosis not present

## 2023-05-26 DIAGNOSIS — J449 Chronic obstructive pulmonary disease, unspecified: Secondary | ICD-10-CM | POA: Diagnosis not present

## 2023-05-27 ENCOUNTER — Encounter: Payer: Self-pay | Admitting: Internal Medicine

## 2023-05-27 ENCOUNTER — Ambulatory Visit: Payer: Medicare PPO | Admitting: Internal Medicine

## 2023-05-27 VITALS — BP 116/64 | HR 61 | Temp 98.1°F | Ht 63.0 in | Wt 160.8 lb

## 2023-05-27 DIAGNOSIS — J449 Chronic obstructive pulmonary disease, unspecified: Secondary | ICD-10-CM

## 2023-05-27 DIAGNOSIS — F05 Delirium due to known physiological condition: Secondary | ICD-10-CM

## 2023-05-27 DIAGNOSIS — N184 Chronic kidney disease, stage 4 (severe): Secondary | ICD-10-CM | POA: Diagnosis not present

## 2023-05-27 DIAGNOSIS — E1122 Type 2 diabetes mellitus with diabetic chronic kidney disease: Secondary | ICD-10-CM | POA: Diagnosis not present

## 2023-05-27 DIAGNOSIS — R410 Disorientation, unspecified: Secondary | ICD-10-CM | POA: Diagnosis not present

## 2023-05-27 DIAGNOSIS — K5904 Chronic idiopathic constipation: Secondary | ICD-10-CM

## 2023-05-27 NOTE — Progress Notes (Signed)
Subjective:  Patient ID: Holly Simmons, female    DOB: 02/13/1941  Age: 82 y.o. MRN: [id number]  CC: Diabetes   HPI [name] presents for f/up -----   PCP: Etta Grandchild, MD   Admit date: 05/15/2023 Discharge date: 05/18/2023   Admitted From: Home Disposition:  Home   Discharge Condition:Stable CODE STATUS:FULL Diet recommendation: Heart Healthy    Brief/Interim Summary: Holly Simmons is a 82 y.o. female with medical history significant of CKD stage IIIb, chronic HFpEF, hypertrophic cardiomyopathy status post myomectomy in 2012, CHB status post PPM, bioprosthetic TV, atrial fibrillation on Coumadin, hyperlipidemia, dementia, COPD presented to the ED as code stroke from urgent care for evaluation of confusion and acute onset left-sided weakness .  Stroke was suspected on admission.  CT head was negative for acute findings.  Neurology also consulted.  Her presentation was mostly consistent with toxic metabolic encephalopathy in the setting of dementia rather than a stroke.  MRI not pursued.  Currently her mentation has returned to baseline.  Neurology has signed off.  Medically stable for discharge to home today.  PT recommend home health.   Following problems were addressed during the hospitalization:   Acute metabolic encephalopathy: Presented with confusion, left-sided weakness.  CT head negative for acute findings.Her presentation was mostly consistent with toxic metabolic encephalopathy in the setting of dementia rather than a stroke.  MRI not pursued.  Currently her mentation has returned to baseline.  Neurology has signed off.   Wellbutrin will be continued because her kidney function has returned to baseline.  EEG did not show seizures.  Takes Lamictal at home which will be continued.   AKI on CKD stage IIIb: Baseline creatinine is around 1.9.  Currently kidney function at baseline.   Hypercalcemia: Resolved with IV fluid   Right conjunctival  hemorrhage: Does not have effect on her vision from baseline.  No pain or purulent discharge.  We recommend supportive care.  She has an appointment with her ophthalmologist later this week.   History of COPD: Has some cough.  On room air.  Continue home bronchodilators.  Continue Mucinex   Paroxysmal A-fib: Paced rhythm.  Takes warfarin for anticoagulation.  Continue metoprolol for rate control   Chronic diastolic CHF: Currently appears euvolemic.  Takes torsemide at home which will continue   Debility/deconditioning: PT recommend home as an discharge     Discharge Diagnoses:  Principal Problem:   Altered mental status Active Problems:   Acute kidney injury superimposed on chronic kidney disease (HCC)   Hypercalcemia   Chronic heart failure with preserved ejection fraction (HFpEF) (HCC)   A-fib (HCC)   COPD (chronic obstructive pulmonary disease) (HCC)   Hyperglycemia   Conjunctival hemorrhage of right eye      Discussed the use of AI scribe software for clinical note transcription with the patient, who gave verbal consent to proceed.  History of Present Illness   The patient, with a history of stage 4 kidney disease, diabetes, and aging, presented with an episode of altered mental status and physical weakness. The episode began when the patient was unable to hold onto her sunglasses with her left hand and was dragging her left foot while walking. The patient was taken to urgent care where she was evaluated and subsequently transported to the hospital via ambulance.  At the hospital, a CT scan showed no bleeds or clots, and the stroke protocol was cancelled. However, lab work revealed that the patient's kidneys were not functioning  properly and she was unable to be awakened despite sternum rubs.  Despite showing signs of improvement by the following day, the patient's mental status has been fluctuating, with periods of delirium and difficulty staying awake. The patient has also been  experiencing decreased appetite and difficulty eating, raising concerns about further dehydration.       Outpatient Medications Prior to Visit  Medication Sig Dispense Refill   albuterol (VENTOLIN HFA) 108 (90 Base) MCG/ACT inhaler Inhale 2 puffs into the lungs every 6 (six) hours as needed for wheezing or shortness of breath. 18 g 0   allopurinol (ZYLOPRIM) 100 MG tablet TAKE ONE TABLET BY MOUTH DAILY 90 tablet 1   Betamethasone Valerate 0.12 % foam Apply 1 application topically daily. (Patient taking differently: Apply 1 application  topically once a week.) 100 g 0   buPROPion (WELLBUTRIN XL) 300 MG 24 hr tablet Take 1 tablet (300 mg total) by mouth daily. 90 tablet 0   famotidine (PEPCID) 40 MG tablet TAKE ONE TABLET BY MOUTH DAILY 90 tablet 1   ferrous sulfate 325 (65 FE) MG tablet Take 1 tablet (325 mg total) by mouth 2 (two) times daily with a meal. (Patient taking differently: Take 325 mg by mouth daily with breakfast.) 180 tablet 1   ipratropium (ATROVENT) 0.03 % nasal spray USE 2 SPRAYS IN EACH NOSTRIL 3 TIMES A DAY AS NEEDED. (Patient taking differently: Place 2 sprays into both nostrils daily as needed (nose bleed).) 30 mL 0   linaclotide (LINZESS) 72 MCG capsule TAKE 1 CAPSULE EVERY DAY BEFORE BREAKFAST 90 capsule 0   metoprolol tartrate (LOPRESSOR) 25 MG tablet TAKE ONE TABLET BY MOUTH TWICE DAILY 180 tablet 1   modafinil (PROVIGIL) 100 MG tablet Take 1 tablet (100 mg total) by mouth daily. (Patient taking differently: Take 100 mg by mouth daily as needed (sleep disorder).) 30 tablet 5   Pitavastatin Calcium 2 MG TABS TAKE ONE TABLET BY MOUTH DAILY 90 tablet 1   Propylene Glycol (SYSTANE BALANCE OP) Place 1 drop into both eyes 4 (four) times daily as needed (dry eyes).     REXULTI 0.25 MG TABS tablet TAKE 1 TABLET BY MOUTH DAILY 90 tablet 0   SYMBICORT 80-4.5 MCG/ACT inhaler USE 2 PUFFS TWICE A DAY. (Patient taking differently: 2 puffs 2 (two) times daily.) 30.6 g 5   torsemide  (DEMADEX) 20 MG tablet Take 1 tablet (20 mg total) by mouth daily. 90 tablet 1   Vilazodone HCl (VIIBRYD) 40 MG TABS TAKE ONE TABLET BY MOUTH DAILY 90 tablet 1   warfarin (COUMADIN) 2.5 MG tablet TAKE 1 TABLET BY MOUTH DAILY EXCEPT TAKE 1/2 TABLET ON WEDNESDAYS OR AS DIRECTED BY ANTICOAGULATION CLINIC (Patient taking differently: Take 1.25-2.5 mg by mouth See admin instructions. TAKE 1 TABLET BY MOUTH DAILY EXCEPT TAKE 1/2 TABLET ON MONDAYS OR AS DIRECTED BY ANTICOAGULATION CLINIC) 180 tablet 1   guaiFENesin-dextromethorphan (ROBITUSSIN DM) 100-10 MG/5ML syrup Take 10 mLs by mouth every 6 (six) hours as needed for cough. (Patient not taking: Reported on 05/27/2023) 237 mL 1   lamoTRIgine (LAMICTAL) 25 MG tablet Take 1 tablet (25 mg total) by mouth daily. 90 tablet 0   PRESCRIPTION MEDICATION 1 Dose by Other route at bedtime as needed (sleep). CPAP (Patient not taking: Reported on 05/27/2023)     warfarin (COUMADIN) 1 MG tablet Take 1 tablet (1 mg total) by mouth daily. TAKE 1 1/2 TABLET EVERY WEDNESDAY OR AS DIRECTED BY ANTICOAGULATION LCINIC (Patient not taking: Reported on  05/16/2023) 20 tablet 0   No facility-administered medications prior to visit.    ROS Review of Systems  Constitutional:  Positive for appetite change, fatigue and unexpected weight change (wt loss). Negative for diaphoresis.  HENT: Negative.    Eyes: Negative.  Negative for visual disturbance.  Respiratory:  Positive for shortness of breath. Negative for cough, chest tightness and wheezing.   Cardiovascular:  Negative for chest pain, palpitations and leg swelling.  Gastrointestinal:  Positive for constipation. Negative for abdominal pain, anal bleeding, blood in stool, diarrhea, nausea and vomiting.  Endocrine: Negative.   Genitourinary: Negative.  Negative for difficulty urinating.  Musculoskeletal: Negative.   Skin: Negative.   Neurological:  Positive for weakness. Negative for dizziness.  Hematological:  Negative for  adenopathy. Bruises/bleeds easily.  Psychiatric/Behavioral:  Positive for behavioral problems, confusion, decreased concentration and sleep disturbance. Negative for suicidal ideas. The patient is not nervous/anxious.     Objective:  BP 116/64 (BP Location: Left Arm, Patient Position: Sitting, Cuff Size: Normal)   Pulse 61   Temp 98.1 F (36.7 C) (Oral)   Ht 5\' 3"  (1.6 m)   Wt 160 lb 12.8 oz (72.9 kg)   SpO2 93%   BMI 28.48 kg/m   BP Readings from Last 3 Encounters:  05/27/23 116/64  05/18/23 (!) 154/57  05/15/23 (!) 163/73    Wt Readings from Last 3 Encounters:  05/27/23 160 lb 12.8 oz (72.9 kg)  05/15/23 170 lb (77.1 kg)  04/15/23 170 lb (77.1 kg)    Physical Exam HENT:     Mouth/Throat:     Mouth: Mucous membranes are moist.  Eyes:     General: No scleral icterus.    Conjunctiva/sclera: Conjunctivae normal.  Cardiovascular:     Rate and Rhythm: Normal rate and regular rhythm.     Heart sounds: Murmur heard.     Systolic murmur is present with a grade of 1/6.     Diastolic murmur is present with a grade of 3/4.     No friction rub. No gallop.  Pulmonary:     Effort: Pulmonary effort is normal.     Breath sounds: No stridor. No wheezing, rhonchi or rales.  Abdominal:     General: Abdomen is flat.     Palpations: There is no mass.     Tenderness: There is no abdominal tenderness. There is no guarding.     Hernia: No hernia is present.  Musculoskeletal:        General: Normal range of motion.     Cervical back: Neck supple.     Right lower leg: No edema.  Lymphadenopathy:     Cervical: No cervical adenopathy.  Skin:    General: Skin is warm.     Coloration: Skin is pale.  Neurological:     Mental Status: She is alert. Mental status is at baseline.  Psychiatric:        Attention and Perception: Perception normal. She is inattentive.        Mood and Affect: Mood is depressed. Affect is flat.        Speech: Speech is delayed, slurred and tangential.         Behavior: Behavior is withdrawn. Behavior is cooperative.        Thought Content: Thought content normal. Thought content is not paranoid or delusional. Thought content does not include homicidal or suicidal ideation.        Cognition and Memory: Cognition is impaired. Memory is impaired.  Lab Results  Component Value Date   WBC 8.3 05/18/2023   HGB 10.0 (L) 05/18/2023   HCT 31.7 (L) 05/18/2023   PLT 142 (L) 05/18/2023   GLUCOSE 114 (H) 05/18/2023   CHOL 129 03/19/2022   TRIG 40 03/19/2022   HDL 47 03/19/2022   LDLCALC 74 03/19/2022   ALT 12 05/16/2023   AST 21 05/16/2023   NA 135 05/18/2023   K 3.8 05/18/2023   CL 99 05/18/2023   CREATININE 1.67 (H) 05/18/2023   BUN 29 (H) 05/18/2023   CO2 26 05/18/2023   TSH 1.578 05/16/2023   INR 2.6 (H) 05/18/2023   HGBA1C 6.6 (H) 05/16/2023   MICROALBUR 63.9 (H) 04/20/2018    EEG adult  Result Date: 05/16/2023 Charlsie Quest, MD     05/16/2023 12:39 PM Patient Name: DRISANA HUI MRN: [id number] Epilepsy Attending: Charlsie Quest Referring Physician/Provider: Arnetha Courser, MD Date: 05/16/2023 Duration: 24.42 mins Patient history: 82yo F with acute onset of ams and left sided weakness. EEG to evaluate for seizure Level of alertness: Awake, asleep AEDs during EEG study: None Technical aspects: This EEG study was done with scalp electrodes positioned according to the 10-20 International system of electrode placement. Electrical activity was reviewed with band pass filter of 1-70Hz , sensitivity of 7 uV/mm, display speed of 75mm/sec with a 60Hz  notched filter applied as appropriate. EEG data were recorded continuously and digitally stored.  Video monitoring was available and reviewed as appropriate. Description: The posterior dominant rhythm consists of 7 Hz activity of moderate voltage (25-35 uV) seen predominantly in posterior head regions, symmetric and reactive to eye opening and eye closing. Sleep was characterized by vertex  waves, sleep spindles (12 to 14 Hz), maximal frontocentral region. EEG showed continuous generalized  3 to 6 Hz theta-delta slowing. Physiologic photic driving was not seen during photic stimulation.  Hyperventilation was not performed.   ABNORMALITY - Continuous slow, generalized IMPRESSION: This study is suggestive of mild to moderate diffuse encephalopathy. No seizures or epileptiform discharges were seen throughout the recording. Priyanka [name]   CT HEAD WO CONTRAST ( )  Result Date: 05/16/2023 CLINICAL DATA:  82 year old female dragging left leg, left side weakness. Code stroke presentation yesterday. Subsequent encounter. EXAM: CT HEAD WITHOUT CONTRAST TECHNIQUE: Contiguous axial images were obtained from the base of the skull through the vertex without intravenous contrast. RADIATION DOSE REDUCTION: This exam was performed according to the departmental dose-optimization program which includes automated exposure control, adjustment of the mA and/or kV according to patient size and/or use of iterative reconstruction technique. COMPARISON:  Head CT 05/15/2023. FINDINGS: Brain: Stable non contrast CT appearance of the brain. Normal cerebral volume for age. Patchy bilateral white matter hypodensity. No midline shift, ventriculomegaly, mass effect, evidence of mass lesion, intracranial hemorrhage or evidence of cortically based acute infarction. Vascular: Resolved small volume intravenous gas seen yesterday, likely was IV access related. Calcified atherosclerosis at the skull base. No suspicious intracranial vascular hyperdensity. Skull: No acute osseous abnormality identified. Sinuses/Orbits: Mild paranasal sinus mucosal thickening is stable. Tympanic cavities and mastoids appear clear. Other: No acute orbit or scalp soft tissue finding. IMPRESSION: Stable non contrast CT appearance of cerebral white matter disease. No acute intracranial abnormality. Electronically Signed   By: Odessa Fleming M.D.   On:  05/16/2023 07:18   DG Chest Portable 1 View  Result Date: 05/15/2023 CLINICAL DATA:  Cough EXAM: PORTABLE CHEST 1 VIEW COMPARISON:  Radiograph 07/01/2022 FINDINGS: Left chest wall CRT-P. Sternotomy and CABG. Epicardial  pacing wires. Stable cardiomediastinal silhouette. Aortic atherosclerotic calcification. Low lung volumes accentuate pulmonary vascularity. No pleural effusion or pneumothorax. IMPRESSION: Low lung volumes.  No active disease. Electronically Signed   By: Minerva Fester M.D.   On: 05/15/2023 21:57   CT HEAD CODE STROKE WO CONTRAST  Result Date: 05/15/2023 CLINICAL DATA:  Code stroke. Neuro deficit, acute, stroke suspected. Left-sided weakness. EXAM: CT HEAD WITHOUT CONTRAST TECHNIQUE: Contiguous axial images were obtained from the base of the skull through the vertex without intravenous contrast. RADIATION DOSE REDUCTION: This exam was performed according to the departmental dose-optimization program which includes automated exposure control, adjustment of the mA and/or kV according to patient size and/or use of iterative reconstruction technique. COMPARISON:  Head CT 03/19/2022. FINDINGS: Brain: No acute hemorrhage. Unchanged moderate chronic small-vessel disease. Cortical gray-white differentiation is otherwise preserved. Prominence of the ventricles and sulci within expected range for age. No hydrocephalus or extra-axial collection. No mass effect or midline shift. Vascular: No hyperdense vessel or unexpected calcification. Skull: No calvarial fracture or suspicious bone lesion. Skull base is unremarkable. Sinuses/Orbits: No acute finding. Other: None. ASPECTS [facility] Stroke Program Early CT Score) - Ganglionic level infarction (caudate, lentiform nuclei, internal capsule, insula, M1-M3 cortex): 7 - Supraganglionic infarction (M4-M6 cortex): 3 Total score (0-10 with 10 being normal): 10 IMPRESSION: 1. No acute intracranial hemorrhage or evidence of acute large vessel territory infarct.  ASPECT score is 10. 2. Stable moderate chronic small-vessel disease. Electronically Signed   By: Orvan Falconer M.D.   On: 05/15/2023 18:00    Assessment & Plan:   Delirium due to another medical condition- After a discussion with the patient, her daughter and son, we have agreed that it would be best for her to get assistance from hospice. -     Ambulatory referral to Hospice  Chronic obstructive pulmonary disease, unspecified COPD type (HCC) -     Ambulatory referral to Hospice  Chronic idiopathic constipation -     Ambulatory referral to Hospice  Type 2 diabetes mellitus with stage 4 chronic kidney disease, without long-term current use of insulin (HCC) -     HM Diabetes Foot Exam     Follow-up: No follow-ups on file.  Sanda Linger, MD

## 2023-05-29 ENCOUNTER — Telehealth: Payer: Self-pay | Admitting: Internal Medicine

## 2023-05-29 ENCOUNTER — Encounter: Payer: Self-pay | Admitting: Internal Medicine

## 2023-05-29 ENCOUNTER — Ambulatory Visit: Payer: Medicare PPO

## 2023-05-29 DIAGNOSIS — Z7901 Long term (current) use of anticoagulants: Secondary | ICD-10-CM

## 2023-05-29 DIAGNOSIS — N1832 Chronic kidney disease, stage 3b: Secondary | ICD-10-CM | POA: Diagnosis not present

## 2023-05-29 DIAGNOSIS — E1165 Type 2 diabetes mellitus with hyperglycemia: Secondary | ICD-10-CM | POA: Diagnosis not present

## 2023-05-29 DIAGNOSIS — I480 Paroxysmal atrial fibrillation: Secondary | ICD-10-CM | POA: Diagnosis not present

## 2023-05-29 DIAGNOSIS — I5032 Chronic diastolic (congestive) heart failure: Secondary | ICD-10-CM | POA: Diagnosis not present

## 2023-05-29 DIAGNOSIS — G9341 Metabolic encephalopathy: Secondary | ICD-10-CM | POA: Diagnosis not present

## 2023-05-29 DIAGNOSIS — E1122 Type 2 diabetes mellitus with diabetic chronic kidney disease: Secondary | ICD-10-CM | POA: Diagnosis not present

## 2023-05-29 DIAGNOSIS — J449 Chronic obstructive pulmonary disease, unspecified: Secondary | ICD-10-CM | POA: Diagnosis not present

## 2023-05-29 LAB — POCT INR: INR: 4.6 — AB (ref 2.0–3.0)

## 2023-05-29 NOTE — Patient Instructions (Addendum)
Pre visit review using our clinic review tool, if applicable. No additional management support is needed unless otherwise documented below in the visit note.  Hold dose today and hold dose tomorrow and then change weekly dose to take 1 tablets daily except take 1/2 tablet on Monday, Wednesday and Friday. Recheck in 1 week.

## 2023-05-29 NOTE — Telephone Encounter (Signed)
Please advise.  Need a new order for her sliding scale for her weight to say 153-163 faxed to [phone] attention to BRAD

## 2023-05-29 NOTE — Telephone Encounter (Signed)
Holly Simmons called from [facility] reporting a fall on 10/23 with no injuries. Nida Boatman is also requesting for pt a new weight scale because he is stating the old one is too high and its not as accurate. Pt average weight 158.6 but it has been a little off.  Best call back is [phone]

## 2023-05-29 NOTE — Telephone Encounter (Signed)
This msg was forwarded by CMA to the PA pool but no word on authorization for this prolia injection. The authorization in this msg reports it is good until 08/04/23 but this authorization has already been used for one injection. I believe we need a new authorization before each injection. Forwarded to PA team. Will not provide Prolia injection to pt today when she is in for coumadin clinic until there is verification the PA has been completed.

## 2023-05-29 NOTE — Progress Notes (Cosign Needed Addendum)
Pt and pt's son, Benita Gutter, deny any bleeding or abnormal bruising. Advised if any occurred to take pt to ER. Benita Gutter verbalized understanding. Hold dose today and hold dose tomorrow and then change weekly dose to take 1 tablets daily except take 1/2 tablet on Monday, Wednesday and Friday. Recheck in 1 week.  Medical screening examination/treatment/procedure(s) were performed by non-physician practitioner and as supervising physician I was immediately available for consultation/collaboration.  I agree with above. Jacinta Shoe, MD

## 2023-06-01 ENCOUNTER — Other Ambulatory Visit: Payer: Self-pay | Admitting: Internal Medicine

## 2023-06-01 DIAGNOSIS — G301 Alzheimer's disease with late onset: Secondary | ICD-10-CM

## 2023-06-01 DIAGNOSIS — F05 Delirium due to known physiological condition: Secondary | ICD-10-CM

## 2023-06-01 DIAGNOSIS — I5032 Chronic diastolic (congestive) heart failure: Secondary | ICD-10-CM

## 2023-06-01 DIAGNOSIS — R64 Cachexia: Secondary | ICD-10-CM

## 2023-06-01 NOTE — Telephone Encounter (Signed)
Prolia authorization approved from 10/06/19 to 08/03/24  Approval # [id number]

## 2023-06-01 NOTE — Telephone Encounter (Signed)
  All Conversations: Medication Management (Newest Message First)June 01, 2023 Dorien Chihuahua, CPhT  to Me     06/01/23  1:12 PM Good afternoon. She is good on her prior authorization for Prolia. Per the representative, it does not go by how many, but is based on medical necessity and has been updated until 08/03/2024    Noted. Thank you for the clarification. Will administer Prolia at next coumadin clinic apt, at the end of the week, 11/1. Made note on schedule.

## 2023-06-02 ENCOUNTER — Telehealth: Payer: Self-pay | Admitting: Internal Medicine

## 2023-06-02 NOTE — Telephone Encounter (Signed)
Holly Simmons states that there is a form or letter that [facility] needs to be sent to them for this pt from her PCP she is back to baseline and pt family isn't 100% sure about hospice. Pt family is suggesting Palliative Care instead. Please advise. If there is more info needed; Danielle Cb # is -- [phone]

## 2023-06-02 NOTE — Telephone Encounter (Signed)
Cordelia Pen Is aware of Dr. Yetta Barre comments.

## 2023-06-02 NOTE — Telephone Encounter (Signed)
[facility] called to see if Dr. Yetta Barre will serve as attending for this patient and see if he thinks this patient has 6 months or less to live.  Please call:  Cordelia Pen [phone]

## 2023-06-02 NOTE — Telephone Encounter (Signed)
Please advise 

## 2023-06-03 ENCOUNTER — Telehealth: Payer: Self-pay | Admitting: Internal Medicine

## 2023-06-03 NOTE — Telephone Encounter (Signed)
FYI

## 2023-06-03 NOTE — Telephone Encounter (Signed)
Star called from [facility] Letting Dr. Yetta Barre know the pt did not admit to hospice and she will be seen for palliative care under [facility].   [phone]

## 2023-06-04 ENCOUNTER — Other Ambulatory Visit: Payer: Self-pay | Admitting: Internal Medicine

## 2023-06-05 ENCOUNTER — Telehealth: Payer: Self-pay

## 2023-06-05 ENCOUNTER — Ambulatory Visit (INDEPENDENT_AMBULATORY_CARE_PROVIDER_SITE_OTHER): Payer: Medicare PPO

## 2023-06-05 DIAGNOSIS — M810 Age-related osteoporosis without current pathological fracture: Secondary | ICD-10-CM

## 2023-06-05 DIAGNOSIS — Z7901 Long term (current) use of anticoagulants: Secondary | ICD-10-CM

## 2023-06-05 LAB — POCT INR: INR: 3.3 — AB (ref 2.0–3.0)

## 2023-06-05 MED ORDER — DENOSUMAB 60 MG/ML ~~LOC~~ SOSY
60.0000 mg | PREFILLED_SYRINGE | Freq: Once | SUBCUTANEOUS | Status: AC
  Administered 2023-06-05: 60 mg via SUBCUTANEOUS

## 2023-06-05 NOTE — Progress Notes (Signed)
Hold dose today and then change weekly dose to take 1/2 tablets daily except take 1 tablet on Monday, Wednesday and Friday. Recheck in 2 week.   Pt also due for Prolia injection. Administered Prolia. Pt tolerated well.  Sent msg to set up a PA if needed for next Prolia.

## 2023-06-05 NOTE — Patient Instructions (Addendum)
Pre visit review using our clinic review tool, if applicable. No additional management support is needed unless otherwise documented below in the visit note.  Hold dose today and then change weekly dose to take 1/2 tablets daily except take 1 tablet on Monday, Wednesday and Friday. Recheck in 2 week.

## 2023-06-05 NOTE — Telephone Encounter (Signed)
Pt in office today for coumadin clinic apt. She was due for Prolia injection. All prior auths were performed and pt was cleared for injection.   Sending msg so pt will be set up for another Prolia injection in 6 months.

## 2023-06-08 ENCOUNTER — Other Ambulatory Visit: Payer: Self-pay | Admitting: Internal Medicine

## 2023-06-08 ENCOUNTER — Telehealth: Payer: Self-pay | Admitting: Internal Medicine

## 2023-06-08 DIAGNOSIS — G9341 Metabolic encephalopathy: Secondary | ICD-10-CM | POA: Diagnosis not present

## 2023-06-08 DIAGNOSIS — I480 Paroxysmal atrial fibrillation: Secondary | ICD-10-CM | POA: Diagnosis not present

## 2023-06-08 DIAGNOSIS — N1832 Chronic kidney disease, stage 3b: Secondary | ICD-10-CM | POA: Diagnosis not present

## 2023-06-08 DIAGNOSIS — J449 Chronic obstructive pulmonary disease, unspecified: Secondary | ICD-10-CM | POA: Diagnosis not present

## 2023-06-08 DIAGNOSIS — E1122 Type 2 diabetes mellitus with diabetic chronic kidney disease: Secondary | ICD-10-CM | POA: Diagnosis not present

## 2023-06-08 DIAGNOSIS — K5904 Chronic idiopathic constipation: Secondary | ICD-10-CM

## 2023-06-08 DIAGNOSIS — I5032 Chronic diastolic (congestive) heart failure: Secondary | ICD-10-CM | POA: Diagnosis not present

## 2023-06-08 DIAGNOSIS — E1165 Type 2 diabetes mellitus with hyperglycemia: Secondary | ICD-10-CM | POA: Diagnosis not present

## 2023-06-08 DIAGNOSIS — Z7901 Long term (current) use of anticoagulants: Secondary | ICD-10-CM | POA: Diagnosis not present

## 2023-06-08 NOTE — Telephone Encounter (Signed)
Verbal Orders has been given to [facility]

## 2023-06-08 NOTE — Telephone Encounter (Signed)
Tamela Oddi called from [facility] wanting verbals  for PT   2 weeks for 4 weeks 1 week for 2 weeks  Best call back number [phone]

## 2023-06-08 NOTE — Telephone Encounter (Signed)
May I give the verbal orders ?

## 2023-06-10 ENCOUNTER — Telehealth: Payer: Self-pay | Admitting: Internal Medicine

## 2023-06-10 DIAGNOSIS — I480 Paroxysmal atrial fibrillation: Secondary | ICD-10-CM | POA: Diagnosis not present

## 2023-06-10 DIAGNOSIS — Z7901 Long term (current) use of anticoagulants: Secondary | ICD-10-CM | POA: Diagnosis not present

## 2023-06-10 DIAGNOSIS — J449 Chronic obstructive pulmonary disease, unspecified: Secondary | ICD-10-CM | POA: Diagnosis not present

## 2023-06-10 DIAGNOSIS — G9341 Metabolic encephalopathy: Secondary | ICD-10-CM | POA: Diagnosis not present

## 2023-06-10 DIAGNOSIS — N1832 Chronic kidney disease, stage 3b: Secondary | ICD-10-CM | POA: Diagnosis not present

## 2023-06-10 DIAGNOSIS — I5032 Chronic diastolic (congestive) heart failure: Secondary | ICD-10-CM | POA: Diagnosis not present

## 2023-06-10 DIAGNOSIS — E1122 Type 2 diabetes mellitus with diabetic chronic kidney disease: Secondary | ICD-10-CM | POA: Diagnosis not present

## 2023-06-10 DIAGNOSIS — E1165 Type 2 diabetes mellitus with hyperglycemia: Secondary | ICD-10-CM | POA: Diagnosis not present

## 2023-06-10 NOTE — Telephone Encounter (Signed)
Updated weight parameters has been given to Holly Simmons.

## 2023-06-10 NOTE — Telephone Encounter (Signed)
Can I change the weight parameters ?

## 2023-06-10 NOTE — Telephone Encounter (Signed)
Betsy from [facility] called to request that patient's weight parameters are updated to 158-168. Patient has been consistently around 162-163. Best callback for Tamela Oddi is [phone]).

## 2023-06-15 DIAGNOSIS — E1165 Type 2 diabetes mellitus with hyperglycemia: Secondary | ICD-10-CM | POA: Diagnosis not present

## 2023-06-15 DIAGNOSIS — I480 Paroxysmal atrial fibrillation: Secondary | ICD-10-CM | POA: Diagnosis not present

## 2023-06-15 DIAGNOSIS — Z7901 Long term (current) use of anticoagulants: Secondary | ICD-10-CM | POA: Diagnosis not present

## 2023-06-15 DIAGNOSIS — E1122 Type 2 diabetes mellitus with diabetic chronic kidney disease: Secondary | ICD-10-CM | POA: Diagnosis not present

## 2023-06-15 DIAGNOSIS — G9341 Metabolic encephalopathy: Secondary | ICD-10-CM | POA: Diagnosis not present

## 2023-06-15 DIAGNOSIS — N1832 Chronic kidney disease, stage 3b: Secondary | ICD-10-CM | POA: Diagnosis not present

## 2023-06-15 DIAGNOSIS — J449 Chronic obstructive pulmonary disease, unspecified: Secondary | ICD-10-CM | POA: Diagnosis not present

## 2023-06-15 DIAGNOSIS — I5032 Chronic diastolic (congestive) heart failure: Secondary | ICD-10-CM | POA: Diagnosis not present

## 2023-06-17 DIAGNOSIS — G9341 Metabolic encephalopathy: Secondary | ICD-10-CM | POA: Diagnosis not present

## 2023-06-17 DIAGNOSIS — I480 Paroxysmal atrial fibrillation: Secondary | ICD-10-CM | POA: Diagnosis not present

## 2023-06-17 DIAGNOSIS — E1165 Type 2 diabetes mellitus with hyperglycemia: Secondary | ICD-10-CM | POA: Diagnosis not present

## 2023-06-17 DIAGNOSIS — J449 Chronic obstructive pulmonary disease, unspecified: Secondary | ICD-10-CM | POA: Diagnosis not present

## 2023-06-17 DIAGNOSIS — I5032 Chronic diastolic (congestive) heart failure: Secondary | ICD-10-CM | POA: Diagnosis not present

## 2023-06-17 DIAGNOSIS — N1832 Chronic kidney disease, stage 3b: Secondary | ICD-10-CM | POA: Diagnosis not present

## 2023-06-17 DIAGNOSIS — Z7901 Long term (current) use of anticoagulants: Secondary | ICD-10-CM | POA: Diagnosis not present

## 2023-06-17 DIAGNOSIS — E1122 Type 2 diabetes mellitus with diabetic chronic kidney disease: Secondary | ICD-10-CM | POA: Diagnosis not present

## 2023-06-19 ENCOUNTER — Ambulatory Visit: Payer: Medicare PPO

## 2023-06-19 DIAGNOSIS — Z7901 Long term (current) use of anticoagulants: Secondary | ICD-10-CM

## 2023-06-19 LAB — POCT INR: INR: 1.7 — AB (ref 2.0–3.0)

## 2023-06-19 NOTE — Progress Notes (Addendum)
Pt's son reports pt's appetite has been better in the last week.  Increase dose today to take 1 1/2 tablets and then continue 1/2 tablets daily except take 1 tablet on Monday, Wednesday and Friday. Recheck in 1 week.

## 2023-06-19 NOTE — Patient Instructions (Addendum)
Pre visit review using our clinic review tool, if applicable. No additional management support is needed unless otherwise documented below in the visit note.  Increase dose today to take 1 1/2 tablets and then continue 1/2 tablets daily except take 1 tablet on Monday, Wednesday and Friday. Recheck in 1 week.

## 2023-06-22 DIAGNOSIS — G9341 Metabolic encephalopathy: Secondary | ICD-10-CM | POA: Diagnosis not present

## 2023-06-22 DIAGNOSIS — Z7901 Long term (current) use of anticoagulants: Secondary | ICD-10-CM | POA: Diagnosis not present

## 2023-06-22 DIAGNOSIS — N1832 Chronic kidney disease, stage 3b: Secondary | ICD-10-CM | POA: Diagnosis not present

## 2023-06-22 DIAGNOSIS — E1122 Type 2 diabetes mellitus with diabetic chronic kidney disease: Secondary | ICD-10-CM | POA: Diagnosis not present

## 2023-06-22 DIAGNOSIS — E1165 Type 2 diabetes mellitus with hyperglycemia: Secondary | ICD-10-CM | POA: Diagnosis not present

## 2023-06-22 DIAGNOSIS — I5032 Chronic diastolic (congestive) heart failure: Secondary | ICD-10-CM | POA: Diagnosis not present

## 2023-06-22 DIAGNOSIS — J449 Chronic obstructive pulmonary disease, unspecified: Secondary | ICD-10-CM | POA: Diagnosis not present

## 2023-06-22 DIAGNOSIS — I480 Paroxysmal atrial fibrillation: Secondary | ICD-10-CM | POA: Diagnosis not present

## 2023-06-23 DIAGNOSIS — N184 Chronic kidney disease, stage 4 (severe): Secondary | ICD-10-CM | POA: Diagnosis not present

## 2023-06-23 LAB — IRON,TIBC AND FERRITIN PANEL
%SAT: 27
Ferritin: 93
Iron: 81
TIBC: 305
UIBC: 224

## 2023-06-23 LAB — CBC AND DIFFERENTIAL
HCT: 36 (ref 36–46)
Hemoglobin: 11.5 — AB (ref 12.0–16.0)
Platelets: 129 10*3/uL — AB (ref 150–400)
WBC: 5.9

## 2023-06-23 LAB — CBC: RBC: 3.76 — AB (ref 3.87–5.11)

## 2023-06-23 LAB — BASIC METABOLIC PANEL
BUN: 32 — AB (ref 4–21)
CO2: 25 — AB (ref 13–22)
Chloride: 102 (ref 99–108)
Creatinine: 2.2 — AB (ref 0.5–1.1)
Glucose: 117
Potassium: 4.2 meq/L (ref 3.5–5.1)
Sodium: 143 (ref 137–147)

## 2023-06-23 LAB — VITAMIN D 25 HYDROXY (VIT D DEFICIENCY, FRACTURES): Vit D, 25-Hydroxy: 45

## 2023-06-23 LAB — COMPREHENSIVE METABOLIC PANEL
Albumin: 4.3 (ref 3.5–5.0)
Calcium: 9.9 (ref 8.7–10.7)
eGFR: 23

## 2023-06-25 DIAGNOSIS — H5310 Unspecified subjective visual disturbances: Secondary | ICD-10-CM | POA: Diagnosis not present

## 2023-06-26 ENCOUNTER — Ambulatory Visit (INDEPENDENT_AMBULATORY_CARE_PROVIDER_SITE_OTHER): Payer: Medicare PPO

## 2023-06-26 DIAGNOSIS — Z7901 Long term (current) use of anticoagulants: Secondary | ICD-10-CM

## 2023-06-26 DIAGNOSIS — I480 Paroxysmal atrial fibrillation: Secondary | ICD-10-CM | POA: Diagnosis not present

## 2023-06-26 DIAGNOSIS — J449 Chronic obstructive pulmonary disease, unspecified: Secondary | ICD-10-CM | POA: Diagnosis not present

## 2023-06-26 DIAGNOSIS — I5032 Chronic diastolic (congestive) heart failure: Secondary | ICD-10-CM | POA: Diagnosis not present

## 2023-06-26 DIAGNOSIS — G9341 Metabolic encephalopathy: Secondary | ICD-10-CM | POA: Diagnosis not present

## 2023-06-26 DIAGNOSIS — E1165 Type 2 diabetes mellitus with hyperglycemia: Secondary | ICD-10-CM | POA: Diagnosis not present

## 2023-06-26 DIAGNOSIS — E1122 Type 2 diabetes mellitus with diabetic chronic kidney disease: Secondary | ICD-10-CM | POA: Diagnosis not present

## 2023-06-26 DIAGNOSIS — N1832 Chronic kidney disease, stage 3b: Secondary | ICD-10-CM | POA: Diagnosis not present

## 2023-06-26 LAB — POCT INR: INR: 2.2 (ref 2.0–3.0)

## 2023-06-26 NOTE — Progress Notes (Signed)
Continue 1/2 tablets daily except take 1 tablet on Monday, Wednesday and Friday. Recheck in 3 week.

## 2023-06-26 NOTE — Patient Instructions (Addendum)
Pre visit review using our clinic review tool, if applicable. No additional management support is needed unless otherwise documented below in the visit note.  Continue 1/2 tablets daily except take 1 tablet on Monday, Wednesday and Friday. Recheck in 3 week.

## 2023-06-27 ENCOUNTER — Other Ambulatory Visit: Payer: Self-pay | Admitting: Internal Medicine

## 2023-06-27 DIAGNOSIS — F02C11 Dementia in other diseases classified elsewhere, severe, with agitation: Secondary | ICD-10-CM

## 2023-06-27 DIAGNOSIS — Z7901 Long term (current) use of anticoagulants: Secondary | ICD-10-CM

## 2023-06-27 DIAGNOSIS — F332 Major depressive disorder, recurrent severe without psychotic features: Secondary | ICD-10-CM

## 2023-06-27 DIAGNOSIS — K219 Gastro-esophageal reflux disease without esophagitis: Secondary | ICD-10-CM

## 2023-06-30 DIAGNOSIS — I5032 Chronic diastolic (congestive) heart failure: Secondary | ICD-10-CM | POA: Diagnosis not present

## 2023-06-30 DIAGNOSIS — J449 Chronic obstructive pulmonary disease, unspecified: Secondary | ICD-10-CM | POA: Diagnosis not present

## 2023-06-30 DIAGNOSIS — Z7901 Long term (current) use of anticoagulants: Secondary | ICD-10-CM | POA: Diagnosis not present

## 2023-06-30 DIAGNOSIS — N1832 Chronic kidney disease, stage 3b: Secondary | ICD-10-CM | POA: Diagnosis not present

## 2023-06-30 DIAGNOSIS — G9341 Metabolic encephalopathy: Secondary | ICD-10-CM | POA: Diagnosis not present

## 2023-06-30 DIAGNOSIS — E1165 Type 2 diabetes mellitus with hyperglycemia: Secondary | ICD-10-CM | POA: Diagnosis not present

## 2023-06-30 DIAGNOSIS — E1122 Type 2 diabetes mellitus with diabetic chronic kidney disease: Secondary | ICD-10-CM | POA: Diagnosis not present

## 2023-06-30 DIAGNOSIS — I480 Paroxysmal atrial fibrillation: Secondary | ICD-10-CM | POA: Diagnosis not present

## 2023-07-01 DIAGNOSIS — I5032 Chronic diastolic (congestive) heart failure: Secondary | ICD-10-CM | POA: Diagnosis not present

## 2023-07-01 DIAGNOSIS — E559 Vitamin D deficiency, unspecified: Secondary | ICD-10-CM | POA: Diagnosis not present

## 2023-07-01 DIAGNOSIS — G9341 Metabolic encephalopathy: Secondary | ICD-10-CM | POA: Diagnosis not present

## 2023-07-01 DIAGNOSIS — E1122 Type 2 diabetes mellitus with diabetic chronic kidney disease: Secondary | ICD-10-CM | POA: Diagnosis not present

## 2023-07-01 DIAGNOSIS — N1832 Chronic kidney disease, stage 3b: Secondary | ICD-10-CM | POA: Diagnosis not present

## 2023-07-01 DIAGNOSIS — J449 Chronic obstructive pulmonary disease, unspecified: Secondary | ICD-10-CM | POA: Diagnosis not present

## 2023-07-01 DIAGNOSIS — I5189 Other ill-defined heart diseases: Secondary | ICD-10-CM | POA: Diagnosis not present

## 2023-07-01 DIAGNOSIS — I480 Paroxysmal atrial fibrillation: Secondary | ICD-10-CM | POA: Diagnosis not present

## 2023-07-01 DIAGNOSIS — D631 Anemia in chronic kidney disease: Secondary | ICD-10-CM | POA: Diagnosis not present

## 2023-07-01 DIAGNOSIS — I129 Hypertensive chronic kidney disease with stage 1 through stage 4 chronic kidney disease, or unspecified chronic kidney disease: Secondary | ICD-10-CM | POA: Diagnosis not present

## 2023-07-01 DIAGNOSIS — Z7901 Long term (current) use of anticoagulants: Secondary | ICD-10-CM | POA: Diagnosis not present

## 2023-07-01 DIAGNOSIS — E1165 Type 2 diabetes mellitus with hyperglycemia: Secondary | ICD-10-CM | POA: Diagnosis not present

## 2023-07-01 DIAGNOSIS — N2581 Secondary hyperparathyroidism of renal origin: Secondary | ICD-10-CM | POA: Diagnosis not present

## 2023-07-01 DIAGNOSIS — N184 Chronic kidney disease, stage 4 (severe): Secondary | ICD-10-CM | POA: Diagnosis not present

## 2023-07-04 ENCOUNTER — Encounter: Payer: Self-pay | Admitting: Internal Medicine

## 2023-07-07 ENCOUNTER — Ambulatory Visit (INDEPENDENT_AMBULATORY_CARE_PROVIDER_SITE_OTHER): Payer: Medicare PPO

## 2023-07-07 DIAGNOSIS — I442 Atrioventricular block, complete: Secondary | ICD-10-CM | POA: Diagnosis not present

## 2023-07-08 LAB — CUP PACEART REMOTE DEVICE CHECK
Battery Impedance: 2402 Ohm
Battery Remaining Longevity: 27 mo
Battery Voltage: 2.73 V
Brady Statistic AP VP Percent: 100 %
Brady Statistic AP VS Percent: 0 %
Brady Statistic AS VP Percent: 0 %
Brady Statistic AS VS Percent: 0 %
Date Time Interrogation Session: 20241203091404
Implantable Lead Connection Status: 753985
Implantable Lead Connection Status: 753985
Implantable Lead Implant Date: 20131210
Implantable Lead Implant Date: 20131210
Implantable Lead Location: 753858
Implantable Lead Location: 753859
Implantable Lead Model: 4296
Implantable Lead Model: 5076
Implantable Pulse Generator Implant Date: 20131210
Lead Channel Impedance Value: 503 Ohm
Lead Channel Impedance Value: 971 Ohm
Lead Channel Pacing Threshold Amplitude: 0.625 V
Lead Channel Pacing Threshold Amplitude: 0.875 V
Lead Channel Pacing Threshold Pulse Width: 0.4 ms
Lead Channel Pacing Threshold Pulse Width: 0.4 ms
Lead Channel Setting Pacing Amplitude: 2 V
Lead Channel Setting Pacing Amplitude: 2.5 V
Lead Channel Setting Pacing Pulse Width: 0.4 ms
Lead Channel Setting Sensing Sensitivity: 8 mV
Zone Setting Status: 755011
Zone Setting Status: 755011

## 2023-07-09 DIAGNOSIS — I480 Paroxysmal atrial fibrillation: Secondary | ICD-10-CM | POA: Diagnosis not present

## 2023-07-09 DIAGNOSIS — J449 Chronic obstructive pulmonary disease, unspecified: Secondary | ICD-10-CM | POA: Diagnosis not present

## 2023-07-09 DIAGNOSIS — E1165 Type 2 diabetes mellitus with hyperglycemia: Secondary | ICD-10-CM | POA: Diagnosis not present

## 2023-07-09 DIAGNOSIS — G9341 Metabolic encephalopathy: Secondary | ICD-10-CM | POA: Diagnosis not present

## 2023-07-09 DIAGNOSIS — E1122 Type 2 diabetes mellitus with diabetic chronic kidney disease: Secondary | ICD-10-CM | POA: Diagnosis not present

## 2023-07-09 DIAGNOSIS — Z7901 Long term (current) use of anticoagulants: Secondary | ICD-10-CM | POA: Diagnosis not present

## 2023-07-09 DIAGNOSIS — N1832 Chronic kidney disease, stage 3b: Secondary | ICD-10-CM | POA: Diagnosis not present

## 2023-07-09 DIAGNOSIS — I5032 Chronic diastolic (congestive) heart failure: Secondary | ICD-10-CM | POA: Diagnosis not present

## 2023-07-13 DIAGNOSIS — J449 Chronic obstructive pulmonary disease, unspecified: Secondary | ICD-10-CM | POA: Diagnosis not present

## 2023-07-13 DIAGNOSIS — I480 Paroxysmal atrial fibrillation: Secondary | ICD-10-CM | POA: Diagnosis not present

## 2023-07-13 DIAGNOSIS — G9341 Metabolic encephalopathy: Secondary | ICD-10-CM | POA: Diagnosis not present

## 2023-07-13 DIAGNOSIS — E1165 Type 2 diabetes mellitus with hyperglycemia: Secondary | ICD-10-CM | POA: Diagnosis not present

## 2023-07-13 DIAGNOSIS — Z7901 Long term (current) use of anticoagulants: Secondary | ICD-10-CM | POA: Diagnosis not present

## 2023-07-13 DIAGNOSIS — E1122 Type 2 diabetes mellitus with diabetic chronic kidney disease: Secondary | ICD-10-CM | POA: Diagnosis not present

## 2023-07-13 DIAGNOSIS — N1832 Chronic kidney disease, stage 3b: Secondary | ICD-10-CM | POA: Diagnosis not present

## 2023-07-13 DIAGNOSIS — I5032 Chronic diastolic (congestive) heart failure: Secondary | ICD-10-CM | POA: Diagnosis not present

## 2023-07-14 ENCOUNTER — Ambulatory Visit: Payer: Medicare PPO

## 2023-07-14 DIAGNOSIS — Z7901 Long term (current) use of anticoagulants: Secondary | ICD-10-CM

## 2023-07-14 LAB — POCT INR: INR: 2.2 (ref 2.0–3.0)

## 2023-07-14 NOTE — Progress Notes (Signed)
Continue 1/2 tablets daily except take 1 tablet on Monday, Wednesday and Friday. Recheck in 4 week.

## 2023-07-14 NOTE — Patient Instructions (Addendum)
Pre visit review using our clinic review tool, if applicable. No additional management support is needed unless otherwise documented below in the visit note.  Continue 1/2 tablets daily except take 1 tablet on Monday, Wednesday and Friday. Recheck in 4 week.

## 2023-07-27 ENCOUNTER — Ambulatory Visit: Payer: Medicare PPO | Admitting: Internal Medicine

## 2023-07-27 ENCOUNTER — Ambulatory Visit (INDEPENDENT_AMBULATORY_CARE_PROVIDER_SITE_OTHER): Payer: Medicare PPO

## 2023-07-27 ENCOUNTER — Encounter: Payer: Self-pay | Admitting: Internal Medicine

## 2023-07-27 VITALS — BP 112/70 | HR 72 | Temp 97.6°F | Ht 63.0 in | Wt 173.0 lb

## 2023-07-27 DIAGNOSIS — B372 Candidiasis of skin and nail: Secondary | ICD-10-CM | POA: Diagnosis not present

## 2023-07-27 DIAGNOSIS — R052 Subacute cough: Secondary | ICD-10-CM | POA: Diagnosis not present

## 2023-07-27 DIAGNOSIS — Z0000 Encounter for general adult medical examination without abnormal findings: Secondary | ICD-10-CM | POA: Diagnosis not present

## 2023-07-27 DIAGNOSIS — R059 Cough, unspecified: Secondary | ICD-10-CM | POA: Diagnosis not present

## 2023-07-27 DIAGNOSIS — I517 Cardiomegaly: Secondary | ICD-10-CM | POA: Diagnosis not present

## 2023-07-27 DIAGNOSIS — L219 Seborrheic dermatitis, unspecified: Secondary | ICD-10-CM | POA: Insufficient documentation

## 2023-07-27 DIAGNOSIS — Z0001 Encounter for general adult medical examination with abnormal findings: Secondary | ICD-10-CM

## 2023-07-27 MED ORDER — KETOCONAZOLE 2 % EX CREA: 1.0000 | TOPICAL_CREAM | Freq: Two times a day (BID) | CUTANEOUS | 2 refills | Status: DC

## 2023-07-27 NOTE — Patient Instructions (Signed)
Skin Yeast Infection  A skin yeast infection is a condition in which there is an overgrowth of yeast (Candida) that normally lives on the skin. This condition usually occurs in areas of the skin that are constantly warm and moist, such as the skin under the breasts or armpits, or in the groin and other body folds. What are the causes? This condition is caused by a change in the normal balance of the yeast that live on the skin. What increases the risk? You are more likely to develop this condition if you: Are obese. Are pregnant. Are 44 years of age or older. Wear tight clothing. Have any of the following conditions: Diabetes. Malnutrition. A weak body defense system (immune system). Take medicines such as: Birth control pills. Antibiotics. Steroid medicines. What are the signs or symptoms? The most common symptom of this condition is itchiness in the affected area. Other symptoms include: A red, swollen area of the skin. Bumps on the skin. How is this diagnosed? This condition is diagnosed with a medical history and physical exam. Your health care provider may check for yeast by taking scrapings of the skin to be viewed under a microscope. How is this treated? This condition is treated with medicine. Medicines may be prescribed or available over the counter. The medicines may be: Taken by mouth (orally). Applied as a cream or powder to your skin. Follow these instructions at home:  Take or apply over-the-counter and prescription medicines only as told by your health care provider. Maintain a healthy weight. If you need help losing weight, talk with your health care provider. Keep your skin clean and dry. Wear loose-fitting clothing. If you have diabetes, keep your blood sugar under control. Keep all follow-up visits. This is important. Contact a health care provider if: Your symptoms go away and then come back. Your symptoms do not get better with treatment. Your symptoms get  worse. Your rash spreads. You have a fever or chills. You have new symptoms. You have new warmth or redness of your skin. Your rash is painful or bleeding. Summary A skin yeast infection is a condition in which there is an overgrowth of yeast (Candida) that normally lives on the skin. Take or apply over-the-counter and prescription medicines only as told by your health care provider. Keep your skin clean and dry. Contact a health care provider if your symptoms do not get better with treatment. This information is not intended to replace advice given to you by your health care provider. Make sure you discuss any questions you have with your health care provider. Document Revised: 10/09/2020 Document Reviewed: 10/09/2020 Elsevier Patient Education  2024 [employer].

## 2023-07-27 NOTE — Assessment & Plan Note (Signed)
Exam completed, labs reviewed, vaccines reviewed, no cancer screenings indicated, pt ed material was given.

## 2023-07-27 NOTE — Assessment & Plan Note (Signed)
Will treat with ketoconazole 

## 2023-07-27 NOTE — Assessment & Plan Note (Signed)
CXR is negative for mass/infiltrate

## 2023-07-27 NOTE — Progress Notes (Signed)
Subjective:  Patient ID: Holly Simmons, female    DOB: 25-Oct-1940  Age: 82 y.o. MRN: [id number]  CC: Medical Management of Chronic Issues (4 MNTH F/U)   HPI Holly Simmons presents for f/up ----  Discussed the use of AI scribe software for clinical note transcription with the patient, who gave verbal consent to proceed.  History of Present Illness   The patient presents with a recent development of a concerning lesion on her navel. Over the past two days, the lesion has become red but not itchy or painful. The patient suspects that the lesion may have resulted from scratching the area. Despite applying bacitracin ointment to the lesion overnight, there was no noticeable improvement in its appearance by the following morning.       Outpatient Medications Prior to Visit  Medication Sig Dispense Refill   albuterol (VENTOLIN HFA) 108 (90 Base) MCG/ACT inhaler Inhale 2 puffs into the lungs every 6 (six) hours as needed for wheezing or shortness of breath. 18 g 0   allopurinol (ZYLOPRIM) 100 MG tablet TAKE ONE TABLET BY MOUTH DAILY 90 tablet 1   Betamethasone Valerate 0.12 % foam Apply 1 application topically daily. (Patient taking differently: Apply 1 application  topically once a week.) 100 g 0   buPROPion (WELLBUTRIN XL) 300 MG 24 hr tablet Take 1 tablet (300 mg total) by mouth daily. 90 tablet 0   famotidine (PEPCID) 40 MG tablet TAKE ONE TABLET BY MOUTH DAILY 90 tablet 1   ferrous sulfate 325 (65 FE) MG tablet Take 1 tablet (325 mg total) by mouth 2 (two) times daily with a meal. (Patient taking differently: Take 325 mg by mouth daily with breakfast.) 180 tablet 1   guaiFENesin-dextromethorphan (ROBITUSSIN DM) 100-10 MG/5ML syrup Take 10 mLs by mouth every 6 (six) hours as needed for cough. 237 mL 1   ipratropium (ATROVENT) 0.03 % nasal spray USE 2 SPRAYS IN EACH NOSTRIL 3 TIMES A DAY AS NEEDED. (Patient taking differently: Place 2 sprays into both nostrils daily as  needed (nose bleed).) 30 mL 0   lamoTRIgine (LAMICTAL) 25 MG tablet Take 1 tablet (25 mg total) by mouth daily. 90 tablet 0   linaclotide (LINZESS) 72 MCG capsule TAKE 1 CAPSULE EVERY DAY BEFORE BREAKFAST 90 capsule 0   metoprolol tartrate (LOPRESSOR) 25 MG tablet TAKE ONE TABLET BY MOUTH TWICE DAILY 180 tablet 1   modafinil (PROVIGIL) 100 MG tablet Take 1 tablet (100 mg total) by mouth daily. (Patient taking differently: Take 100 mg by mouth daily as needed (sleep disorder).) 30 tablet 5   Pitavastatin Calcium 2 MG TABS TAKE ONE TABLET BY MOUTH DAILY 90 tablet 1   PRESCRIPTION MEDICATION 1 Dose by Other route at bedtime as needed (sleep). CPAP     Propylene Glycol (SYSTANE BALANCE OP) Place 1 drop into both eyes 4 (four) times daily as needed (dry eyes).     REXULTI 0.25 MG TABS tablet TAKE 1 TABLET BY MOUTH DAILY 90 tablet 0   SYMBICORT 80-4.5 MCG/ACT inhaler USE 2 PUFFS TWICE A DAY. (Patient taking differently: 2 puffs 2 (two) times daily.) 30.6 g 5   torsemide (DEMADEX) 20 MG tablet Take 1 tablet (20 mg total) by mouth daily. 90 tablet 1   Vilazodone HCl (VIIBRYD) 40 MG TABS TAKE ONE TABLET BY MOUTH DAILY 90 tablet 1   warfarin (COUMADIN) 2.5 MG tablet TAKE 1 TABLET BY MOUTH DAILY EXCEPT TAKE 1/2 TABLET ON WEDNESDAYS OR AS DIRECTED  BY ANTICOAGULATION CLINIC 180 tablet 0   warfarin (COUMADIN) 1 MG tablet Take 1 tablet (1 mg total) by mouth daily. TAKE 1 1/2 TABLET EVERY WEDNESDAY OR AS DIRECTED BY ANTICOAGULATION [facility] (Patient not taking: Reported on 05/16/2023) 20 tablet 0   No facility-administered medications prior to visit.    ROS Review of Systems  Constitutional:  Positive for fatigue and unexpected weight change (wt gain). Negative for appetite change, chills and diaphoresis.  HENT:  Positive for trouble swallowing. Negative for sore throat and voice change.   Respiratory:  Positive for cough. Negative for chest tightness, shortness of breath and wheezing.   Cardiovascular:   Negative for chest pain, palpitations and leg swelling.  Gastrointestinal:  Negative for abdominal pain, diarrhea, nausea and vomiting.  Genitourinary: Negative.  Negative for difficulty urinating.  Musculoskeletal:  Positive for gait problem. Negative for arthralgias and myalgias.  Skin:  Positive for color change and rash.  Neurological:  Negative for dizziness, weakness, light-headedness and headaches.  Hematological:  Negative for adenopathy. Does not bruise/bleed easily.  Psychiatric/Behavioral:  Positive for confusion, decreased concentration and sleep disturbance. Negative for suicidal ideas. The patient is nervous/anxious.     Objective:  BP 112/70 (BP Location: Right Arm, Patient Position: Sitting, Cuff Size: Normal)   Pulse 72   Temp 97.6 F (36.4 C) (Oral)   Ht 5\' 3"  (1.6 m)   Wt 173 lb (78.5 kg)   SpO2 97%   BMI 30.65 kg/m   BP Readings from Last 3 Encounters:  07/27/23 112/70  05/27/23 116/64  05/18/23 (!) 154/57    Wt Readings from Last 3 Encounters:  07/27/23 173 lb (78.5 kg)  05/27/23 160 lb 12.8 oz (72.9 kg)  05/15/23 170 lb (77.1 kg)    Physical Exam Vitals reviewed.  Constitutional:      General: She is not in acute distress.    Appearance: She is not ill-appearing, toxic-appearing or diaphoretic.  HENT:     Mouth/Throat:     Mouth: Mucous membranes are moist.  Eyes:     General: No scleral icterus.    Conjunctiva/sclera: Conjunctivae normal.  Cardiovascular:     Rate and Rhythm: Normal rate and regular rhythm.     Heart sounds: Murmur heard.     Systolic murmur is present with a grade of 3/6.  Pulmonary:     Effort: Pulmonary effort is normal.     Breath sounds: Examination of the right-lower field reveals rhonchi. Rhonchi present. No decreased breath sounds, wheezing or rales.  Abdominal:     Palpations: There is no mass.     Tenderness: There is no abdominal tenderness. There is no guarding.     Hernia: No hernia is present.   Musculoskeletal:        General: Normal range of motion.     Cervical back: Neck supple.     Right lower leg: 1+ Pitting Edema present.     Left lower leg: 1+ Pitting Edema present.  Lymphadenopathy:     Cervical: No cervical adenopathy.  Skin:    General: Skin is warm and dry.     Findings: No lesion or rash.  Neurological:     General: No focal deficit present.     Mental Status: She is alert. Mental status is at baseline.  Psychiatric:        Mood and Affect: Mood normal.        Behavior: Behavior normal.     Lab Results  Component Value Date  WBC 5.9 06/23/2023   HGB 11.5 (A) 06/23/2023   HCT 36 06/23/2023   PLT 129 (A) 06/23/2023   GLUCOSE 114 (H) 05/18/2023   CHOL 129 03/19/2022   TRIG 40 03/19/2022   HDL 47 03/19/2022   LDLCALC 74 03/19/2022   ALT 12 05/16/2023   AST 21 05/16/2023   NA 143 06/23/2023   K 4.2 06/23/2023   CL 102 06/23/2023   CREATININE 2.2 (A) 06/23/2023   BUN 32 (A) 06/23/2023   CO2 25 (A) 06/23/2023   TSH 1.578 05/16/2023   INR 2.2 07/14/2023   HGBA1C 6.6 (H) 05/16/2023   MICROALBUR 63.9 (H) 04/20/2018    EEG adult Result Date: 05/16/2023 Charlsie Quest, MD     05/16/2023 12:39 PM Patient Name: DAYSI CAYCE MRN: [id number] Epilepsy Attending: Charlsie Quest Referring Physician/Provider: Arnetha Courser, MD Date: 05/16/2023 Duration: 24.42 mins Patient history: 82yo F with acute onset of ams and left sided weakness. EEG to evaluate for seizure Level of alertness: Awake, asleep AEDs during EEG study: None Technical aspects: This EEG study was done with scalp electrodes positioned according to the 10-20 International system of electrode placement. Electrical activity was reviewed with band pass filter of 1-70Hz , sensitivity of 7 uV/mm, display speed of 39mm/sec with a 60Hz  notched filter applied as appropriate. EEG data were recorded continuously and digitally stored.  Video monitoring was available and reviewed as appropriate.  Description: The posterior dominant rhythm consists of 7 Hz activity of moderate voltage (25-35 uV) seen predominantly in posterior head regions, symmetric and reactive to eye opening and eye closing. Sleep was characterized by vertex waves, sleep spindles (12 to 14 Hz), maximal frontocentral region. EEG showed continuous generalized  3 to 6 Hz theta-delta slowing. Physiologic photic driving was not seen during photic stimulation.  Hyperventilation was not performed.   ABNORMALITY - Continuous slow, generalized IMPRESSION: This study is suggestive of mild to moderate diffuse encephalopathy. No seizures or epileptiform discharges were seen throughout the recording. Priyanka [name]   CT HEAD WO CONTRAST ( ) Result Date: 05/16/2023 CLINICAL DATA:  82 year old female dragging left leg, left side weakness. Code stroke presentation yesterday. Subsequent encounter. EXAM: CT HEAD WITHOUT CONTRAST TECHNIQUE: Contiguous axial images were obtained from the base of the skull through the vertex without intravenous contrast. RADIATION DOSE REDUCTION: This exam was performed according to the departmental dose-optimization program which includes automated exposure control, adjustment of the mA and/or kV according to patient size and/or use of iterative reconstruction technique. COMPARISON:  Head CT 05/15/2023. FINDINGS: Brain: Stable non contrast CT appearance of the brain. Normal cerebral volume for age. Patchy bilateral white matter hypodensity. No midline shift, ventriculomegaly, mass effect, evidence of mass lesion, intracranial hemorrhage or evidence of cortically based acute infarction. Vascular: Resolved small volume intravenous gas seen yesterday, likely was IV access related. Calcified atherosclerosis at the skull base. No suspicious intracranial vascular hyperdensity. Skull: No acute osseous abnormality identified. Sinuses/Orbits: Mild paranasal sinus mucosal thickening is stable. Tympanic cavities and mastoids  appear clear. Other: No acute orbit or scalp soft tissue finding. IMPRESSION: Stable non contrast CT appearance of cerebral white matter disease. No acute intracranial abnormality. Electronically Signed   By: Odessa Fleming M.D.   On: 05/16/2023 07:18   DG Chest Portable 1 View Result Date: 05/15/2023 CLINICAL DATA:  Cough EXAM: PORTABLE CHEST 1 VIEW COMPARISON:  Radiograph 07/01/2022 FINDINGS: Left chest wall CRT-P. Sternotomy and CABG. Epicardial pacing wires. Stable cardiomediastinal silhouette. Aortic atherosclerotic calcification. Low lung volumes accentuate  pulmonary vascularity. No pleural effusion or pneumothorax. IMPRESSION: Low lung volumes.  No active disease. Electronically Signed   By: Minerva Fester M.D.   On: 05/15/2023 21:57   CT HEAD CODE STROKE WO CONTRAST Result Date: 05/15/2023 CLINICAL DATA:  Code stroke. Neuro deficit, acute, stroke suspected. Left-sided weakness. EXAM: CT HEAD WITHOUT CONTRAST TECHNIQUE: Contiguous axial images were obtained from the base of the skull through the vertex without intravenous contrast. RADIATION DOSE REDUCTION: This exam was performed according to the departmental dose-optimization program which includes automated exposure control, adjustment of the mA and/or kV according to patient size and/or use of iterative reconstruction technique. COMPARISON:  Head CT 03/19/2022. FINDINGS: Brain: No acute hemorrhage. Unchanged moderate chronic small-vessel disease. Cortical gray-white differentiation is otherwise preserved. Prominence of the ventricles and sulci within expected range for age. No hydrocephalus or extra-axial collection. No mass effect or midline shift. Vascular: No hyperdense vessel or unexpected calcification. Skull: No calvarial fracture or suspicious bone lesion. Skull base is unremarkable. Sinuses/Orbits: No acute finding. Other: None. ASPECTS [facility] Stroke Program Early CT Score) - Ganglionic level infarction (caudate, lentiform nuclei, internal  capsule, insula, M1-M3 cortex): 7 - Supraganglionic infarction (M4-M6 cortex): 3 Total score (0-10 with 10 being normal): 10 IMPRESSION: 1. No acute intracranial hemorrhage or evidence of acute large vessel territory infarct. ASPECT score is 10. 2. Stable moderate chronic small-vessel disease. Electronically Signed   By: Orvan Falconer M.D.   On: 05/15/2023 18:00    DG Chest 2 View Result Date: 07/27/2023 CLINICAL DATA:  Cough. EXAM: CHEST - 2 VIEW COMPARISON:  05/15/2023 FINDINGS: Stable cardiomegaly. 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 Both lungs are clear. IMPRESSION: Stable cardiomegaly.  No active lung disease. Electronically Signed   By: Danae Orleans M.D.   On: 07/27/2023 14:34     Assessment & Plan:     Follow-up: Return in about 3 months (around 10/25/2023).  Sanda Linger, MD

## 2023-07-30 ENCOUNTER — Other Ambulatory Visit: Payer: Self-pay | Admitting: Internal Medicine

## 2023-07-30 DIAGNOSIS — F418 Other specified anxiety disorders: Secondary | ICD-10-CM

## 2023-07-30 DIAGNOSIS — F3341 Major depressive disorder, recurrent, in partial remission: Secondary | ICD-10-CM

## 2023-08-07 ENCOUNTER — Ambulatory Visit
Admission: EM | Admit: 2023-08-07 | Discharge: 2023-08-07 | Disposition: A | Discharge status: Home / Self Care | Payer: Medicare PPO | Attending: Family Medicine | Admitting: Family Medicine

## 2023-08-07 DIAGNOSIS — R319 Hematuria, unspecified: Secondary | ICD-10-CM | POA: Diagnosis not present

## 2023-08-07 DIAGNOSIS — N3001 Acute cystitis with hematuria: Secondary | ICD-10-CM | POA: Diagnosis not present

## 2023-08-07 DIAGNOSIS — F0390 Unspecified dementia without behavioral disturbance: Secondary | ICD-10-CM | POA: Insufficient documentation

## 2023-08-07 LAB — POCT URINALYSIS DIP (MANUAL ENTRY)
Bilirubin, UA: NEGATIVE
Glucose, UA: NEGATIVE mg/dL
Ketones, POC UA: NEGATIVE mg/dL
Nitrite, UA: NEGATIVE
Protein Ur, POC: NEGATIVE mg/dL
Spec Grav, UA: 1.01 (ref 1.010–1.025)
Urobilinogen, UA: 0.2 U/dL
pH, UA: 5.5 (ref 5.0–8.0)

## 2023-08-07 MED ORDER — CEPHALEXIN 250 MG PO CAPS: 250.0000 mg | ORAL_CAPSULE | Freq: Three times a day (TID) | ORAL | 0 refills | Status: DC

## 2023-08-07 NOTE — ED Triage Notes (Signed)
 Pt presents with c/o rt leg pain that moves to the back. Pt daughter states she has been feeling "loopy".   Daughter denies urinary sxs.

## 2023-08-07 NOTE — Discharge Instructions (Addendum)
 Please start cephalexin to address an urinary tract infection. Make sure you hydrate very well with plain water and a quantity of 32 ounces of water a day.  Please limit drinks that are considered urinary irritants such as soda, sweet tea, coffee, energy drinks, alcohol.  These can worsen your urinary and genital symptoms but also be the source of them.  I will let you know about your urine culture results through MyChart to see if we need to prescribe or change your antibiotics based off of those results.

## 2023-08-07 NOTE — ED Provider Notes (Signed)
 [facility]  Note:  This document was prepared using Conservation officer, historic buildings and may include unintentional dictation errors.  MRN: [id number] DOB: 11/19/1940  Subjective:   Holly Simmons is a 83 y.o. female presenting for 2-day history of low back pain.  Has also been more confused and loopy beyond her baseline dementia per her daughter.  She has significant concern about her having an urinary tract infection.  No recent falls, trauma to the back.  Has stage IIIb chronic kidney disease.  No dysuria, overt hematuria.  No current facility-administered medications for this encounter.  Current Outpatient Medications:    albuterol  (VENTOLIN  HFA) 108 (90 Base) MCG/ACT inhaler, Inhale 2 puffs into the lungs every 6 (six) hours as needed for wheezing or shortness of breath., Disp: 18 g, Rfl: 0   allopurinol  (ZYLOPRIM ) 100 MG tablet, TAKE ONE TABLET BY MOUTH DAILY, Disp: 90 tablet, Rfl: 1   Betamethasone  Valerate 0.12 % foam, Apply 1 application topically daily. (Patient taking differently: Apply 1 application  topically once a week.), Disp: 100 g, Rfl: 0   buPROPion  (WELLBUTRIN  XL) 300 MG 24 hr tablet, Take 1 tablet (300 mg total) by mouth daily., Disp: 90 tablet, Rfl: 0   famotidine  (PEPCID ) 40 MG tablet, TAKE ONE TABLET BY MOUTH DAILY, Disp: 90 tablet, Rfl: 1   ferrous sulfate  325 (65 FE) MG tablet, Take 1 tablet (325 mg total) by mouth 2 (two) times daily with a meal. (Patient taking differently: Take 325 mg by mouth daily with breakfast.), Disp: 180 tablet, Rfl: 1   guaiFENesin -dextromethorphan  (ROBITUSSIN DM) 100-10 MG/5ML syrup, Take 10 mLs by mouth every 6 (six) hours as needed for cough., Disp: 237 mL, Rfl: 1   ipratropium (ATROVENT ) 0.03 % nasal spray, USE 2 SPRAYS IN EACH NOSTRIL 3 TIMES A DAY AS NEEDED. (Patient taking differently: Place 2 sprays into both nostrils daily as needed (nose bleed).), Disp: 30 mL, Rfl: 0   ketoconazole  (NIZORAL ) 2 %  cream, Apply 1 Application topically 2 (two) times daily., Disp: 60 g, Rfl: 2   lamoTRIgine  (LAMICTAL ) 25 MG tablet, Take 1 tablet (25 mg total) by mouth daily., Disp: 90 tablet, Rfl: 0   linaclotide  (LINZESS ) 72 MCG capsule, TAKE 1 CAPSULE EVERY DAY BEFORE BREAKFAST, Disp: 90 capsule, Rfl: 0   metoprolol  tartrate (LOPRESSOR ) 25 MG tablet, TAKE ONE TABLET BY MOUTH TWICE DAILY, Disp: 180 tablet, Rfl: 1   modafinil  (PROVIGIL ) 100 MG tablet, Take 1 tablet (100 mg total) by mouth daily. (Patient taking differently: Take 100 mg by mouth daily as needed (sleep disorder).), Disp: 30 tablet, Rfl: 5   Pitavastatin  Calcium  2 MG TABS, TAKE ONE TABLET BY MOUTH DAILY, Disp: 90 tablet, Rfl: 1   PRESCRIPTION MEDICATION, 1 Dose by Other route at bedtime as needed (sleep). CPAP, Disp: , Rfl:    Propylene Glycol (SYSTANE BALANCE OP), Place 1 drop into both eyes 4 (four) times daily as needed (dry eyes)., Disp: , Rfl:    REXULTI  0.25 MG TABS tablet, TAKE 1 TABLET BY MOUTH DAILY, Disp: 90 tablet, Rfl: 0   SYMBICORT  80-4.5 MCG/ACT inhaler, USE 2 PUFFS TWICE A DAY. (Patient taking differently: 2 puffs 2 (two) times daily.), Disp: 30.6 g, Rfl: 5   torsemide  (DEMADEX ) 20 MG tablet, Take 1 tablet (20 mg total) by mouth daily., Disp: 90 tablet, Rfl: 1   Vilazodone  HCl (VIIBRYD ) 40 MG TABS, TAKE ONE TABLET BY MOUTH DAILY, Disp: 90 tablet, Rfl: 1   warfarin (COUMADIN )  2.5 MG tablet, TAKE 1 TABLET BY MOUTH DAILY EXCEPT TAKE 1/2 TABLET ON WEDNESDAYS OR AS DIRECTED BY ANTICOAGULATION CLINIC, Disp: 180 tablet, Rfl: 0   Allergies  Allergen Reactions   Petrolatum-Zinc  Oxide Anaphylaxis, Rash and Swelling   Sulfa Antibiotics Rash   Sulfonamide Derivatives Anaphylaxis and Swelling   Tetanus Toxoid     Other reaction(s): Other (See Comments) OTHER REACTION   Tetanus Toxoids Swelling   Other Rash and Other (See Comments)    STERI - STRIPS  It can affect proteins in her kidneys so she doesn't take REACTION: proteinuria Other  reaction(s): Other (See Comments), Unknown It can affect proteins in her kidneys so she doesn't take   Nsaids Other (See Comments)    REACTION: proteinuria Other reaction(s): Other (See Comments), Unknown It can affect proteins in her kidneys so she doesn't take    Tetanus Toxoid, Adsorbed Other (See Comments)    Other reaction(s): Other (See Comments) Turned arm red Turned arm red    Past Medical History:  Diagnosis Date   Allergic rhinitis    Asthma    NOS w/ acute exacerbation   Blood transfusion without reported diagnosis    CKD (chronic kidney disease) stage 3, GFR 30-59 ml/min (HCC) 09/12/2018   Colon polyps    TUBULAR ADENOMAS AND HYPERPLASTIC   Complete heart block (HCC)    requiring PPM (MDT) post surgical myomectomy at [facility],  leads are epicardial with abdominal implant, high ventricular threshold at implant   COPD (chronic obstructive pulmonary disease) (HCC)    Depressive disorder    Diastolic dysfunction    DM (diabetes mellitus) (HCC)    Gallstones    GERD (gastroesophageal reflux disease)    Gout 08/20/2012   Heart murmur    Hyperpotassemia    Hypersomnia    Hypertension    Hypertrophic cardiomyopathy (HCC)    s/p surgical myomectomy at [facility] 11/12 complicated by septal VSD post procedure requiring reoperation with patch closure and tricuspid valve replacement   Kidney stones    Myocardial infarction (HCC) 2011   Obstructive sleep apnea    persistent daytime sleepiness despite cpap   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   Psoriasis    Pyuria    Renal insufficiency    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   Typical atrial flutter (HCC) 9/15     Past Surgical History:  Procedure Laterality Date   ABDOMINAL HYSTERECTOMY     APPENDECTOMY     BREAST CYST EXCISION     CARDIOVERSION N/A 08/01/2014   Procedure: CARDIOVERSION;  Surgeon: Aleene JINNY Passe, MD;  Location: [facility] ENDOSCOPY;  Service: Cardiovascular;  Laterality: N/A;   CESAREAN SECTION      CHOLECYSTECTOMY     COLONOSCOPY     EYE SURGERY  01/16/2016   left eye lens implant   EYE SURGERY  01/09/2016   right eye lens implant   INSERT / REPLACE / REMOVE PACEMAKER  07/13/2012   PACEMAKER INSERTION  07/02/2011   epicardial wires with abdominal implant at [facility] 12/12,  high ventricular lead threshold at implant per Dr Pierrette   PERMANENT PACEMAKER INSERTION N/A 07/13/2012   Procedure: PERMANENT PACEMAKER INSERTION;  Surgeon: Lynwood Rakers, MD;  Location: [facility] CATH LAB;  Service: Cardiovascular;  Laterality: N/A;   POLYPECTOMY     septal myomectomy for hypertrophic CM  06/23/2011   by Dr Alford at [facility], complicated by septal VSD requiring patch repair and tricuspid valve replacement   TONSILLECTOMY  TOOTH EXTRACTION     TRICUSPID VALVE REPLACEMENT  06/2011   Medtronic 27mm Mosaic tissue valve   VSD REPAIR  06/2011    Family History  Problem Relation Age of Onset   Hypertension Daughter    Heart disease Maternal Grandfather    Heart disease Paternal Grandfather    Bladder Cancer Father    Prostate cancer Father    Cancer Father    Varicose Veins Father    Heart attack Father    Breast cancer Mother    Dementia Mother    Hypertension Mother    Cancer Mother    Ovarian cancer Maternal Aunt    Pancreatic cancer Cousin    Breast cancer Paternal Aunt    Dementia Maternal Aunt        x 2   Dementia Maternal Uncle        x 2   Colon cancer Neg Hx    Rectal cancer Neg Hx    Stomach cancer Neg Hx    Esophageal cancer Neg Hx     Social History   Tobacco Use   Smoking status: Never   Smokeless tobacco: Never  Vaping Use   Vaping status: Never Used  Substance Use Topics   Alcohol  use: No    Alcohol /week: 0.0 standard drinks of alcohol    Drug use: No    ROS   Objective:   Vitals: BP 139/83 (BP Location: Left Arm)   Pulse 73   Temp 98.3 F (36.8 C) (Oral)   Resp 16   SpO2 96%   Physical Exam Constitutional:      General: She is not in acute  distress.    Appearance: Normal appearance. She is well-developed. She is not ill-appearing, toxic-appearing or diaphoretic.  HENT:     Head: Normocephalic and atraumatic.     Nose: Nose normal.     Mouth/Throat:     Mouth: Mucous membranes are moist.  Eyes:     General: No scleral icterus.       Right eye: No discharge.        Left eye: No discharge.     Extraocular Movements: Extraocular movements intact.     Conjunctiva/sclera: Conjunctivae normal.  Cardiovascular:     Rate and Rhythm: Normal rate.  Pulmonary:     Effort: Pulmonary effort is normal.  Abdominal:     General: Bowel sounds are normal. There is no distension.     Palpations: Abdomen is soft. There is no mass.     Tenderness: There is no abdominal tenderness. There is no right CVA tenderness, left CVA tenderness, guarding or rebound.  Musculoskeletal:     Lumbar back: Tenderness (across area outlined by report only) present. No swelling, edema, deformity, signs of trauma, lacerations, spasms or bony tenderness. Normal range of motion. Negative right straight leg raise test and negative left straight leg raise test. No scoliosis.       Back:  Skin:    General: Skin is warm and dry.  Neurological:     General: No focal deficit present.     Mental Status: She is alert and oriented to person, place, and time.     Motor: No weakness.     Gait: Gait normal.  Psychiatric:        Mood and Affect: Mood normal.        Behavior: Behavior normal.        Thought Content: Thought content normal.        Judgment:  Judgment normal.     Results for orders placed or performed during the hospital encounter of 08/07/23 (from the past 24 hours)  POCT urinalysis dipstick     Status: Abnormal   Collection Time: 08/07/23  7:07 PM  Result Value Ref Range   Color, UA light yellow (A) yellow   Clarity, UA clear clear   Glucose, UA negative negative mg/dL   Bilirubin, UA negative negative   Ketones, POC UA negative negative mg/dL    Spec Grav, UA 8.989 1.010 - 1.025   Blood, UA moderate (A) negative   pH, UA 5.5 5.0 - 8.0   Protein Ur, POC negative negative mg/dL   Urobilinogen, UA 0.2 0.2 or 1.0 E.U./dL   Nitrite, UA Negative Negative   Leukocytes, UA Small (1+) (A) Negative   *Note: Due to a large number of results and/or encounters for the requested time period, some results have not been displayed. A complete set of results can be found in Results Review.    Assessment and Plan :   PDMP not reviewed this encounter.  1. Acute cystitis with hematuria   2. Hematuria, unspecified type   3. Dementia, unspecified dementia severity, unspecified dementia type, unspecified whether behavioral, psychotic, or mood disturbance or anxiety (HCC)      Had an extensive discussion with patient's daughter about the differential which includes an acute encephalopathy given her acute confusion.  Patient's daughter prefers to address and urinary tract infection despite mild leukouria on urinalysis.  Creatinine clearance calculated 28mL/min and therefore will be using cephalexin .  Urine culture pending.  Maintain strict ER precautions.   Christopher Savannah, [place] 08/08/23 [phone]

## 2023-08-08 LAB — URINE CULTURE: Culture: <10000 — AB

## 2023-08-14 ENCOUNTER — Ambulatory Visit: Payer: Medicare PPO

## 2023-08-14 DIAGNOSIS — Z7901 Long term (current) use of anticoagulants: Secondary | ICD-10-CM | POA: Diagnosis not present

## 2023-08-14 LAB — POCT INR: INR: 1.9 — AB (ref 2.0–3.0)

## 2023-08-14 NOTE — Patient Instructions (Addendum)
 Pre visit review using our clinic review tool, if applicable. No additional management support is needed unless otherwise documented below in the visit note.  Increase dose today to take 1 1/2 tablets and then continue 1/2 tablets daily except take 1 tablet on Monday, Wednesday and Friday. Recheck in 2 week.

## 2023-08-14 NOTE — Progress Notes (Addendum)
 Pt was on abx, cephalexin , for UTI. Finished two days ago. No interaction with warfarin. Pt also was c/o of upper thigh/groin area pain. No swelling, redness or warmth. Abx did not help this. Pt denies needing any pain control for this. Educated pt's daughter about s/s of a DVT. She verbalized understanding.  Increase dose today to take 1 1/2 tablets and then continue 1/2 tablets daily except take 1 tablet on Monday, Wednesday and Friday. Recheck in 2 week.

## 2023-08-17 ENCOUNTER — Encounter: Payer: Self-pay | Admitting: Internal Medicine

## 2023-08-18 ENCOUNTER — Encounter: Payer: Self-pay | Admitting: Internal Medicine

## 2023-08-18 ENCOUNTER — Ambulatory Visit (INDEPENDENT_AMBULATORY_CARE_PROVIDER_SITE_OTHER): Payer: Medicare PPO

## 2023-08-18 ENCOUNTER — Ambulatory Visit: Payer: Medicare PPO | Admitting: Internal Medicine

## 2023-08-18 ENCOUNTER — Ambulatory Visit: Payer: Self-pay | Admitting: Internal Medicine

## 2023-08-18 VITALS — BP 120/70 | HR 90 | Temp 98.2°F | Ht 63.0 in | Wt 177.0 lb

## 2023-08-18 DIAGNOSIS — M25551 Pain in right hip: Secondary | ICD-10-CM | POA: Diagnosis not present

## 2023-08-18 DIAGNOSIS — M160 Bilateral primary osteoarthritis of hip: Secondary | ICD-10-CM | POA: Diagnosis not present

## 2023-08-18 DIAGNOSIS — I480 Paroxysmal atrial fibrillation: Secondary | ICD-10-CM

## 2023-08-18 DIAGNOSIS — R1031 Right lower quadrant pain: Secondary | ICD-10-CM

## 2023-08-18 DIAGNOSIS — K649 Unspecified hemorrhoids: Secondary | ICD-10-CM | POA: Insufficient documentation

## 2023-08-18 MED ORDER — HYDROCORTISONE ACETATE 25 MG RE SUPP
25.0000 mg | Freq: Two times a day (BID) | RECTAL | 1 refills | Status: DC
Start: 2023-08-18 — End: 2023-11-20

## 2023-08-18 MED ORDER — TRIAMCINOLONE ACETONIDE 0.1 % EX OINT: 1.0000 | TOPICAL_OINTMENT | Freq: Two times a day (BID) | CUTANEOUS | 1 refills | Status: DC

## 2023-08-18 MED ORDER — TRAMADOL HCL 50 MG PO TABS: 50.0000 mg | ORAL_TABLET | Freq: Four times a day (QID) | ORAL | 1 refills | Status: AC | PRN

## 2023-08-18 NOTE — Assessment & Plan Note (Signed)
She is on Coumadin.

## 2023-08-18 NOTE — Telephone Encounter (Signed)
  Chief Complaint: R leg pain Symptoms: pain, weakness Frequency: 2 weeks, worsening over the last two day- constant Pertinent Negatives: Patient denies Chest pain, redness, streaking, fever Disposition: [] ED /[] Urgent Care (no appt availability in office) / [x] Appointment(In office/virtual)/ []  [facility]/ [] Home Care/ [] Refused Recommended Disposition /[] [facility]/ []  Follow-up with PCP Additional Notes: Patient [place]- calls with patient present. States patient has had right leg pain, specifically the thigh, for two weeks worsening over the last two days. States patient is having a difficult time ambulating without assistance. Denies redness, warmth, swelling. Son states he has given her tylenol  for pain but he does not feel it is helping her. Reports increased weakness and work of breathing with ambulation. Per protocol, patient to be evaluated within 4 hours. Scheduled for today at 1100 with alternate provider. Care advice reviewed. Patient son states he was speaking with someone regarding scheduling on mychart- this RN contacted [facility] and spoke with Rocky, notifying that patient has been scheduled.   Copied from CRM [id number]. Topic: Appointments - Appointment Scheduling >> Aug 18, 2023  8:59 AM Victoria A wrote: Patient had bleeding Hemorrhoids on Sunday; has had muscle strain in right leg and past few days her leg has stiffen Reason for Disposition  [1] Thigh or calf pain AND [2] only 1 side AND [3] present > 1 hour (Exception: Chronic unchanged pain.)  Answer Assessment - Initial Assessment Questions 1. ONSET: When did the pain start?      2 weeks, worsening over the last two days 2. LOCATION: Where is the pain located?      Right leg, upper thigh 3. PAIN: How bad is the pain?    (Scale 1-10; or mild, moderate, severe)   -  MILD (1-3): doesn't interfere with normal activities    -  MODERATE (4-7): interferes with normal activities (e.g., work or  school) or awakens from sleep, limping    -  SEVERE (8-10): excruciating pain, unable to do any normal activities, unable to walk     Described as moderate by son 4. WORK OR EXERCISE: Has there been any recent work or exercise that involved this part of the body?      Denies 5. CAUSE: What do you think is causing the leg pain?     Unsure, patient is on blood thinner concerned it may be a clot. Not red or warm 6. OTHER SYMPTOMS: Do you have any other symptoms? (e.g., chest pain, back pain, breathing difficulty, swelling, rash, fever, numbness, weakness)     weakness  Protocols used: Leg Pain-A-AH

## 2023-08-18 NOTE — Progress Notes (Signed)
 Subjective:  Patient ID: Holly Simmons, female    DOB: 1941-07-10  Age: 83 y.o. MRN: [id number]  CC: Leg Pain (Rt thigh pain x2 weeks.. Pain has increased x3 days a/w limiting ROM.)   HPI Holly Simmons presents for R groin pain x 2 weeks, possibly twisted her leg while trying to fall, managed to catch herself. The pain has gotten much worse over the past 2 to 3 days.  Tylenol  does not help any longer.  She is screaming with pain at times.  She would rate her pain at 8 out of 10 today.  Pain is worse with range of motion.  The family [place] about the possibility of a blood clot in the leg.  The patient is on Coumadin .  Nelwyn and Marjorie are with her; they help with history.  C/o bleeding hemorrhoids - chronic problem, worse now.  Apparently she used suppositories in the past for external and internal hemorrhoids  Multiple medical problems -chart reviewed  Outpatient Medications Prior to Visit  Medication Sig Dispense Refill   albuterol  (VENTOLIN  HFA) 108 (90 Base) MCG/ACT inhaler Inhale 2 puffs into the lungs every 6 (six) hours as needed for wheezing or shortness of breath. 18 g 0   allopurinol  (ZYLOPRIM ) 100 MG tablet TAKE ONE TABLET BY MOUTH DAILY 90 tablet 1   Betamethasone  Valerate 0.12 % foam Apply 1 application topically daily. (Patient taking differently: Apply 1 application  topically once a week.) 100 g 0   buPROPion  (WELLBUTRIN  XL) 300 MG 24 hr tablet Take 1 tablet (300 mg total) by mouth daily. 90 tablet 0   cephALEXin  (KEFLEX ) 250 MG capsule Take 1 capsule (250 mg total) by mouth 3 (three) times daily. 15 capsule 0   famotidine  (PEPCID ) 40 MG tablet TAKE ONE TABLET BY MOUTH DAILY 90 tablet 1   ferrous sulfate  325 (65 FE) MG tablet Take 1 tablet (325 mg total) by mouth 2 (two) times daily with a meal. (Patient taking differently: Take 325 mg by mouth daily with breakfast.) 180 tablet 1   guaiFENesin -dextromethorphan  (ROBITUSSIN DM) 100-10 MG/5ML syrup Take 10 mLs  by mouth every 6 (six) hours as needed for cough. 237 mL 1   ipratropium (ATROVENT ) 0.03 % nasal spray USE 2 SPRAYS IN EACH NOSTRIL 3 TIMES A DAY AS NEEDED. (Patient taking differently: Place 2 sprays into both nostrils daily as needed (nose bleed).) 30 mL 0   ketoconazole  (NIZORAL ) 2 % cream Apply 1 Application topically 2 (two) times daily. 60 g 2   lamoTRIgine  (LAMICTAL ) 25 MG tablet Take 1 tablet (25 mg total) by mouth daily. 90 tablet 0   linaclotide  (LINZESS ) 72 MCG capsule TAKE 1 CAPSULE EVERY DAY BEFORE BREAKFAST 90 capsule 0   metoprolol  tartrate (LOPRESSOR ) 25 MG tablet TAKE ONE TABLET BY MOUTH TWICE DAILY 180 tablet 1   modafinil  (PROVIGIL ) 100 MG tablet Take 1 tablet (100 mg total) by mouth daily. (Patient taking differently: Take 100 mg by mouth daily as needed (sleep disorder).) 30 tablet 5   Pitavastatin  Calcium  2 MG TABS TAKE ONE TABLET BY MOUTH DAILY 90 tablet 1   PRESCRIPTION MEDICATION 1 Dose by Other route at bedtime as needed (sleep). CPAP     Propylene Glycol (SYSTANE BALANCE OP) Place 1 drop into both eyes 4 (four) times daily as needed (dry eyes).     REXULTI  0.25 MG TABS tablet TAKE 1 TABLET BY MOUTH DAILY 90 tablet 0   SYMBICORT  80-4.5 MCG/ACT inhaler USE 2 PUFFS TWICE  A DAY. (Patient taking differently: 2 puffs 2 (two) times daily.) 30.6 g 5   torsemide  (DEMADEX ) 20 MG tablet Take 1 tablet (20 mg total) by mouth daily. 90 tablet 1   Vilazodone  HCl (VIIBRYD ) 40 MG TABS TAKE ONE TABLET BY MOUTH DAILY 90 tablet 1   warfarin (COUMADIN ) 2.5 MG tablet TAKE 1 TABLET BY MOUTH DAILY EXCEPT TAKE 1/2 TABLET ON WEDNESDAYS OR AS DIRECTED BY ANTICOAGULATION CLINIC 180 tablet 0   No facility-administered medications prior to visit.    ROS: Review of Systems  Constitutional:  Negative for activity change, appetite change, chills, fatigue, fever and unexpected weight change.  HENT:  Negative for congestion, mouth sores and sinus pressure.   Eyes:  Negative for visual disturbance.   Respiratory:  Negative for cough and chest tightness.   Gastrointestinal:  Negative for abdominal pain and nausea.  Genitourinary:  Negative for difficulty urinating, frequency and vaginal pain.  Musculoskeletal:  Positive for arthralgias and gait problem. Negative for back pain.  Skin:  Negative for pallor and rash.  Neurological:  Positive for weakness. Negative for dizziness, tremors, numbness and headaches.  Hematological:  Bruises/bleeds easily.  Psychiatric/Behavioral:  Positive for decreased concentration. Negative for confusion, self-injury and sleep disturbance. The patient is not nervous/anxious.     Objective:  BP 120/70 (BP Location: Left Arm, Patient Position: Sitting, Cuff Size: Normal)   Pulse 90   Temp 98.2 F (36.8 C) (Oral)   Ht 5' 3 (1.6 m)   Wt 177 lb (80.3 kg)   SpO2 92%   BMI 31.35 kg/m   BP Readings from Last 3 Encounters:  08/18/23 120/70  08/07/23 139/83  07/27/23 112/70    Wt Readings from Last 3 Encounters:  08/18/23 177 lb (80.3 kg)  07/27/23 173 lb (78.5 kg)  05/27/23 160 lb 12.8 oz (72.9 kg)    Physical Exam Constitutional:      General: She is not in acute distress.    Appearance: She is well-developed. She is obese. She is not toxic-appearing.  HENT:     Head: Normocephalic.     Right Ear: External ear normal.     Left Ear: External ear normal.     Nose: Nose normal.  Eyes:     General:        Right eye: No discharge.        Left eye: No discharge.     Conjunctiva/sclera: Conjunctivae normal.     Pupils: Pupils are equal, round, and reactive to light.  Neck:     Thyroid : No thyromegaly.     Vascular: No JVD.     Trachea: No tracheal deviation.  Cardiovascular:     Rate and Rhythm: Normal rate and regular rhythm.     Heart sounds: Normal heart sounds.  Pulmonary:     Effort: No respiratory distress.     Breath sounds: No stridor. No wheezing.  Abdominal:     General: Bowel sounds are normal. There is no distension.      Palpations: Abdomen is soft. There is no mass.     Tenderness: There is no abdominal tenderness. There is no guarding or rebound.  Musculoskeletal:        General: Tenderness present.     Cervical back: Normal range of motion and neck supple. No rigidity.     Right lower leg: No edema.     Left lower leg: No edema.  Lymphadenopathy:     Cervical: No cervical adenopathy.  Skin:  Findings: No erythema or rash.  Neurological:     Mental Status: Mental status is at baseline.     Cranial Nerves: No cranial nerve deficit.     Motor: Weakness present. No abnormal muscle tone.     Coordination: Coordination abnormal.     Gait: Gait abnormal.     Deep Tendon Reflexes: Reflexes normal.  Psychiatric:        Behavior: Behavior normal.        Thought Content: Thought content normal.        Judgment: Judgment normal.    R groin painful w/ROM.  No obvious pain, mass palpated deep.  No hernia.  Range of motion is somewhat decreased due to pain.  No rash.  No edema. She is able to bear weight both legs and walk.  Her gait is ataxic, antalgic. Lumbar spine without obvious tenderness.  Straight leg elevation seems to be okay.  Pelvis NT.  The patient is alert and cooperative    A total time of 45 minutes was spent preparing to see the patient, reviewing tests, x-rays, operative reports and other medical records.  Also, obtaining history and performing comprehensive physical exam.  Additionally, counseling the patient regarding the above listed issues.   Finally, documenting clinical information in the health records, coordination of care, educating the patient. It is a complex case.  Lab Results  Component Value Date   WBC 5.9 06/23/2023   HGB 11.5 (A) 06/23/2023   HCT 36 06/23/2023   PLT 129 (A) 06/23/2023   GLUCOSE 114 (H) 05/18/2023   CHOL 129 03/19/2022   TRIG 40 03/19/2022   HDL 47 03/19/2022   LDLCALC 74 03/19/2022   ALT 12 05/16/2023   AST 21 05/16/2023   NA 143 06/23/2023   K 4.2  06/23/2023   CL 102 06/23/2023   CREATININE 2.2 (A) 06/23/2023   BUN 32 (A) 06/23/2023   CO2 25 (A) 06/23/2023   TSH 1.578 05/16/2023   INR 1.9 (A) 08/14/2023   HGBA1C 6.6 (H) 05/16/2023   MICROALBUR 63.9 (H) 04/20/2018    No results found.  Assessment & Plan:   Problem List Items Addressed This Visit     A-fib [facility])   She is on Coumadin       Groin pain, right - Primary     New, severe ?MSK vs other.  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  Rule out AVN, tendinitis, hematoma etc. X ray R hip Sch Venous doppler US  - the family is worried about DVT Tramadol  prn  Potential benefits of a short/long term opioids use as well as potential risks (i.e. addiction risk, apnea etc) and complications (i.e. Somnolence, constipation and others) were explained to the patient and were aknowledged. Blue-Emu cream was recommended to use 2-3 times a day Rice sock heating pad Blue-Emu cream was recommended to use 2-3 times a day      Relevant Orders   VAS US  LOWER EXTREMITY VENOUS (DVT)   DG HIP UNILAT WITH PELVIS 2-3 VIEWS RIGHT (Completed)   Hemorrhoids   Chronic/recurrent.  Probably external and internal.  Anusol  HC supp prescribed Triamc oint prescribed Use wet wipes or wash after BMs Continue with stool softener/laxatives         Meds ordered this encounter  Medications   traMADol  (ULTRAM ) 50 MG tablet    Sig: Take 1 tablet (50 mg total) by mouth every 6 (six) hours as needed for up to 5 days.    Dispense:  20 tablet  Refill:  1   hydrocortisone  (ANUSOL -HC) 25 MG suppository    Sig: Place 1 suppository (25 mg total) rectally 2 (two) times daily.    Dispense:  12 suppository    Refill:  1   triamcinolone  ointment (KENALOG ) 0.1 %    Sig: Apply 1 Application topically 2 (two) times daily. Use every day - bid    Dispense:  80 g    Refill:  1      Follow-up: Return in about 1 week (around 08/25/2023) for f/u with PCP.  Marolyn Noel, MD

## 2023-08-18 NOTE — Assessment & Plan Note (Addendum)
   New, severe ?MSK vs other.  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  Rule out AVN, tendinitis, hematoma etc. X ray R hip Sch Venous doppler US  - the family is worried about DVT Tramadol  prn  Potential benefits of a short/long term opioids use as well as potential risks (i.e. addiction risk, apnea etc) and complications (i.e. Somnolence, constipation and others) were explained to the patient and were aknowledged. Blue-Emu cream was recommended to use 2-3 times a day Rice sock heating pad Blue-Emu cream was recommended to use 2-3 times a day

## 2023-08-18 NOTE — Assessment & Plan Note (Addendum)
 Chronic/recurrent.  Probably external and internal.  Anusol HC supp prescribed Triamc oint prescribed Use wet wipes or wash after BMs Continue with stool softener/laxatives

## 2023-08-18 NOTE — Patient Instructions (Signed)
 Blue-Emu cream -- use 2-3 times a day ? ?

## 2023-08-19 ENCOUNTER — Ambulatory Visit (HOSPITAL_COMMUNITY)
Admission: RE | Admit: 2023-08-19 | Discharge: 2023-08-19 | Disposition: A | Discharge status: Home / Self Care | Payer: Medicare PPO | Source: Ambulatory Visit | Attending: Internal Medicine | Admitting: Internal Medicine

## 2023-08-19 DIAGNOSIS — R1031 Right lower quadrant pain: Secondary | ICD-10-CM | POA: Diagnosis not present

## 2023-08-24 ENCOUNTER — Other Ambulatory Visit: Payer: Self-pay | Admitting: Internal Medicine

## 2023-08-24 DIAGNOSIS — K5904 Chronic idiopathic constipation: Secondary | ICD-10-CM

## 2023-08-25 ENCOUNTER — Encounter: Payer: Self-pay | Admitting: Internal Medicine

## 2023-08-25 ENCOUNTER — Ambulatory Visit: Payer: Medicare PPO

## 2023-08-25 ENCOUNTER — Ambulatory Visit: Payer: Medicare PPO | Admitting: Internal Medicine

## 2023-08-25 VITALS — BP 130/66 | HR 72 | Temp 98.2°F | Ht 63.0 in | Wt 172.6 lb

## 2023-08-25 DIAGNOSIS — D696 Thrombocytopenia, unspecified: Secondary | ICD-10-CM | POA: Diagnosis not present

## 2023-08-25 DIAGNOSIS — N184 Chronic kidney disease, stage 4 (severe): Secondary | ICD-10-CM | POA: Diagnosis not present

## 2023-08-25 DIAGNOSIS — Z7901 Long term (current) use of anticoagulants: Secondary | ICD-10-CM

## 2023-08-25 DIAGNOSIS — R102 Pelvic and perineal pain: Secondary | ICD-10-CM

## 2023-08-25 DIAGNOSIS — N1832 Chronic kidney disease, stage 3b: Secondary | ICD-10-CM

## 2023-08-25 DIAGNOSIS — E1122 Type 2 diabetes mellitus with diabetic chronic kidney disease: Secondary | ICD-10-CM | POA: Diagnosis not present

## 2023-08-25 LAB — URINALYSIS, ROUTINE W REFLEX MICROSCOPIC
Bilirubin Urine: NEGATIVE
Ketones, ur: NEGATIVE
Nitrite: NEGATIVE
Specific Gravity, Urine: 1.01 (ref 1.000–1.030)
Total Protein, Urine: NEGATIVE
Urine Glucose: NEGATIVE
Urobilinogen, UA: 1 (ref 0.0–1.0)
pH: 6 (ref 5.0–8.0)

## 2023-08-25 LAB — CBC WITH DIFFERENTIAL/PLATELET
Basophils Absolute: 0 10*3/uL (ref 0.0–0.1)
Basophils Relative: 0.5 % (ref 0.0–3.0)
Eosinophils Absolute: 0.3 10*3/uL (ref 0.0–0.7)
Eosinophils Relative: 3.5 % (ref 0.0–5.0)
HCT: 34.7 % — ABNORMAL LOW (ref 36.0–46.0)
Hemoglobin: 10.9 g/dL — ABNORMAL LOW (ref 12.0–15.0)
Lymphocytes Relative: 8.2 % — ABNORMAL LOW (ref 12.0–46.0)
Lymphs Abs: 0.6 10*3/uL — ABNORMAL LOW (ref 0.7–4.0)
MCHC: 31.5 g/dL (ref 30.0–36.0)
MCV: 97.7 fL (ref 78.0–100.0)
Monocytes Absolute: 0.9 10*3/uL (ref 0.1–1.0)
Monocytes Relative: 11.9 % (ref 3.0–12.0)
Neutro Abs: 5.9 10*3/uL (ref 1.4–7.7)
Neutrophils Relative %: 75.9 % (ref 43.0–77.0)
Platelets: 185 10*3/uL (ref 150.0–400.0)
RBC: 3.55 Mil/uL — ABNORMAL LOW (ref 3.87–5.11)
RDW: 16.5 % — ABNORMAL HIGH (ref 11.5–15.5)
WBC: 7.8 10*3/uL (ref 4.0–10.5)

## 2023-08-25 LAB — BASIC METABOLIC PANEL
BUN: 38 mg/dL — ABNORMAL HIGH (ref 6–23)
CO2: 32 meq/L (ref 19–32)
Calcium: 10.1 mg/dL (ref 8.4–10.5)
Chloride: 100 meq/L (ref 96–112)
Creatinine, Ser: 2.08 mg/dL — ABNORMAL HIGH (ref 0.40–1.20)
GFR: 22.16 mL/min — ABNORMAL LOW (ref >60.00)
Glucose, Bld: 142 mg/dL — ABNORMAL HIGH (ref 70–99)
Potassium: 3.7 meq/L (ref 3.5–5.1)
Sodium: 141 meq/L (ref 135–145)

## 2023-08-25 LAB — C-REACTIVE PROTEIN: CRP: 1.4 mg/dL (ref 0.5–20.0)

## 2023-08-25 LAB — HEMOGLOBIN A1C: Hgb A1c MFr Bld: 6.9 % — ABNORMAL HIGH (ref 4.6–6.5)

## 2023-08-25 LAB — POCT INR: INR: 1.8 — AB (ref 2.0–3.0)

## 2023-08-25 NOTE — Patient Instructions (Addendum)
Pre visit review using our clinic review tool, if applicable. No additional management support is needed unless otherwise documented below in the visit note.  Increase dose today to take 1 tablets and then change weekly dose to take 1 tablets daily except take 1/2 tablet on Tuesday, Thursday and Saturday. Recheck in 2 week.

## 2023-08-25 NOTE — Progress Notes (Signed)
Pt also has apt with PCP today. Upper thigh/groin area pain was assessed last week with [place] and no DVT was found. Pt reports continued pain in this area and has apt with PCP today for assessment. Increase dose today to take 1 tablets and then change weekly dose to take 1 tablets daily except take 1/2 tablet on Tuesday, Thursday and Saturday. Recheck in 2 week.

## 2023-08-25 NOTE — Progress Notes (Signed)
Subjective:  Patient ID: Holly Simmons, female    DOB: 09-Jan-1941  Age: 83 y.o. MRN: [id number]  CC: Diabetes   HPI Holly Simmons presents for f/up -----  She complains of pain in her R > L hips. The pain radiated down her right thigh and into the pelvis on both sides.   Outpatient Medications Prior to Visit  Medication Sig Dispense Refill   albuterol (VENTOLIN HFA) 108 (90 Base) MCG/ACT inhaler Inhale 2 puffs into the lungs every 6 (six) hours as needed for wheezing or shortness of breath. 18 g 0   allopurinol (ZYLOPRIM) 100 MG tablet TAKE ONE TABLET BY MOUTH DAILY 90 tablet 1   Betamethasone Valerate 0.12 % foam Apply 1 application topically daily. (Patient taking differently: Apply 1 application  topically once a week.) 100 g 0   buPROPion (WELLBUTRIN XL) 300 MG 24 hr tablet Take 1 tablet (300 mg total) by mouth daily. 90 tablet 0   cephALEXin (KEFLEX) 250 MG capsule Take 1 capsule (250 mg total) by mouth 3 (three) times daily. 15 capsule 0   famotidine (PEPCID) 40 MG tablet TAKE ONE TABLET BY MOUTH DAILY 90 tablet 1   ferrous sulfate 325 (65 FE) MG tablet Take 1 tablet (325 mg total) by mouth 2 (two) times daily with a meal. (Patient taking differently: Take 325 mg by mouth daily with breakfast.) 180 tablet 1   guaiFENesin-dextromethorphan (ROBITUSSIN DM) 100-10 MG/5ML syrup Take 10 mLs by mouth every 6 (six) hours as needed for cough. 237 mL 1   hydrocortisone (ANUSOL-HC) 25 MG suppository Place 1 suppository (25 mg total) rectally 2 (two) times daily. 12 suppository 1   ipratropium (ATROVENT) 0.03 % nasal spray USE 2 SPRAYS IN EACH NOSTRIL 3 TIMES A DAY AS NEEDED. (Patient taking differently: Place 2 sprays into both nostrils daily as needed (nose bleed).) 30 mL 0   ketoconazole (NIZORAL) 2 % cream Apply 1 Application topically 2 (two) times daily. 60 g 2   lamoTRIgine (LAMICTAL) 25 MG tablet Take 1 tablet (25 mg total) by mouth daily. 90 tablet 0   linaclotide  (LINZESS) 72 MCG capsule TAKE 1 CAPSULE EVERY DAY BEFORE BREAKFAST 90 capsule 0   metoprolol tartrate (LOPRESSOR) 25 MG tablet TAKE ONE TABLET BY MOUTH TWICE DAILY 180 tablet 1   modafinil (PROVIGIL) 100 MG tablet Take 1 tablet (100 mg total) by mouth daily. (Patient taking differently: Take 100 mg by mouth daily as needed (sleep disorder).) 30 tablet 5   Pitavastatin Calcium 2 MG TABS TAKE ONE TABLET BY MOUTH DAILY 90 tablet 1   PRESCRIPTION MEDICATION 1 Dose by Other route at bedtime as needed (sleep). CPAP     Propylene Glycol (SYSTANE BALANCE OP) Place 1 drop into both eyes 4 (four) times daily as needed (dry eyes).     REXULTI 0.25 MG TABS tablet TAKE 1 TABLET BY MOUTH DAILY 90 tablet 0   SYMBICORT 80-4.5 MCG/ACT inhaler USE 2 PUFFS TWICE A DAY. (Patient taking differently: 2 puffs 2 (two) times daily.) 30.6 g 5   torsemide (DEMADEX) 20 MG tablet Take 1 tablet (20 mg total) by mouth daily. 90 tablet 1   triamcinolone ointment (KENALOG) 0.1 % Apply 1 Application topically 2 (two) times daily. Use every day - bid 80 g 1   Vilazodone HCl (VIIBRYD) 40 MG TABS TAKE ONE TABLET BY MOUTH DAILY 90 tablet 1   warfarin (COUMADIN) 2.5 MG tablet TAKE 1 TABLET BY MOUTH DAILY EXCEPT TAKE 1/2 TABLET  ON WEDNESDAYS OR AS DIRECTED BY ANTICOAGULATION CLINIC 180 tablet 0   No facility-administered medications prior to visit.    ROS Review of Systems  Constitutional: Negative.  Negative for appetite change, chills, diaphoresis and fatigue.  HENT: Negative.    Eyes: Negative.   Respiratory:  Negative for cough, chest tightness, shortness of breath and wheezing.   Cardiovascular:  Positive for leg swelling. Negative for chest pain and palpitations.  Gastrointestinal:  Negative for abdominal pain, constipation, diarrhea, nausea and vomiting.  Endocrine: Negative.   Genitourinary:  Positive for pelvic pain. Negative for difficulty urinating, dysuria, hematuria and urgency.  Musculoskeletal:  Positive for  arthralgias and gait problem. Negative for back pain and joint swelling.  Skin: Negative.   Neurological:  Positive for weakness.  Hematological:  Negative for adenopathy. Bruises/bleeds easily.  Psychiatric/Behavioral:  Positive for confusion and decreased concentration.     Objective:  BP 130/66 (BP Location: Left Arm, Patient Position: Sitting, Cuff Size: Normal)   Pulse 72   Temp 98.2 F (36.8 C) (Oral)   Ht 5\' 3"  (1.6 m)   Wt 172 lb 9.6 oz (78.3 kg)   SpO2 94%   BMI 30.57 kg/m   BP Readings from Last 3 Encounters:  08/25/23 130/66  08/18/23 120/70  08/07/23 139/83    Wt Readings from Last 3 Encounters:  08/25/23 172 lb 9.6 oz (78.3 kg)  08/18/23 177 lb (80.3 kg)  07/27/23 173 lb (78.5 kg)    Physical Exam Vitals reviewed.  Constitutional:      General: She is not in acute distress.    Appearance: She is ill-appearing. She is not toxic-appearing or diaphoretic.  HENT:     Mouth/Throat:     Mouth: Mucous membranes are moist.  Eyes:     General: No scleral icterus.    Conjunctiva/sclera: Conjunctivae normal.  Cardiovascular:     Rate and Rhythm: Normal rate and regular rhythm.     Heart sounds: Murmur heard.     Systolic murmur is present with a grade of 2/6.     No friction rub. No gallop.  Pulmonary:     Effort: Pulmonary effort is normal.     Breath sounds: No stridor. No wheezing, rhonchi or rales.  Abdominal:     General: Abdomen is flat.     Palpations: There is no mass.     Tenderness: There is no abdominal tenderness. There is no guarding.     Hernia: No hernia is present.  Musculoskeletal:        General: No swelling or deformity.     Cervical back: Neck supple.     Right lower leg: 1+ Pitting Edema present.     Left lower leg: 1+ Pitting Edema present.  Lymphadenopathy:     Cervical: No cervical adenopathy.  Skin:    Coloration: Skin is pale.     Findings: Bruising present. No rash.  Neurological:     General: No focal deficit present.      Mental Status: She is alert.  Psychiatric:        Mood and Affect: Mood normal.        Behavior: Behavior normal.     Lab Results  Component Value Date   WBC 7.8 08/25/2023   HGB 10.9 (L) 08/25/2023   HCT 34.7 (L) 08/25/2023   PLT 185.0 08/25/2023   GLUCOSE 142 (H) 08/25/2023   CHOL 129 03/19/2022   TRIG 40 03/19/2022   HDL 47 03/19/2022   LDLCALC 74  03/19/2022   ALT 12 05/16/2023   AST 21 05/16/2023   NA 141 08/25/2023   K 3.7 08/25/2023   CL 100 08/25/2023   CREATININE 2.08 (H) 08/25/2023   BUN 38 (H) 08/25/2023   CO2 32 08/25/2023   TSH 1.578 05/16/2023   INR 1.8 (A) 08/25/2023   HGBA1C 6.9 (H) 08/25/2023   MICROALBUR 63.9 (H) 04/20/2018    VAS [place] LOWER EXTREMITY VENOUS (DVT) Result Date: 08/19/2023  Lower Venous DVT Study Patient Name:  Holly Simmons  Date of Exam:   08/19/2023 Medical Rec #: [id number]             Accession #:    [id number] Date of Birth: 1941/02/27            Patient Gender: F Patient Age:   8 years Exam Location:  [facility] Procedure:      VAS [place] LOWER EXTREMITY VENOUS (DVT) Referring Phys: Macarthur Critchley PLOTNIKOV --------------------------------------------------------------------------------  Indications: Right groin pain 2 weeks.  Performing Technologist: Dorthula Matas RVS, RCS  Examination Guidelines: A complete evaluation includes B-mode imaging, spectral Doppler, color Doppler, and power Doppler as needed of all accessible portions of each vessel. Bilateral testing is considered an integral part of a complete examination. Limited examinations for reoccurring indications may be performed as noted. The reflux portion of the exam is performed with the patient in reverse Trendelenburg.  +---------+---------------+---------+-----------+----------+--------------+ RIGHT    CompressibilityPhasicitySpontaneityPropertiesThrombus Aging +---------+---------------+---------+-----------+----------+--------------+ CFV      Full                                                         +---------+---------------+---------+-----------+----------+--------------+ SFJ      Full                                                        +---------+---------------+---------+-----------+----------+--------------+ FV Prox  Full                                                        +---------+---------------+---------+-----------+----------+--------------+ FV Mid   Full                                                        +---------+---------------+---------+-----------+----------+--------------+ FV DistalFull                                                        +---------+---------------+---------+-----------+----------+--------------+ POP      Full                                                        +---------+---------------+---------+-----------+----------+--------------+  PTV      Full                                                        +---------+---------------+---------+-----------+----------+--------------+ PERO     Full                                                        +---------+---------------+---------+-----------+----------+--------------+ GSV      Full                                                        +---------+---------------+---------+-----------+----------+--------------+    Findings reported to routed in epic at 12:20 pm.  Summary: RIGHT: - There is no evidence of deep vein thrombosis in the lower extremity. - There is no evidence of superficial venous thrombosis.  LEFT: - No evidence of common femoral vein obstruction.   *See table(s) above for measurements and observations. Electronically signed by Lemar Livings MD on 08/19/2023 at 5:33:49 PM.    Final    VAS [place] LOWER EXTREMITY VENOUS (DVT) Result Date: 08/19/2023  Lower Venous DVT Study Patient Name:  Holly Simmons  Date of Exam:   08/19/2023 Medical Rec #: [id number]             Accession #:    [id number]  Date of Birth: 08/25/1940            Patient Gender: F Patient Age:   88 years Exam Location:  [facility] Procedure:      VAS [place] LOWER EXTREMITY VENOUS (DVT) Referring Phys: Macarthur Critchley PLOTNIKOV --------------------------------------------------------------------------------  Indications: Right groin pain 2 weeks.  Performing Technologist: Dorthula Matas RVS, RCS  Examination Guidelines: A complete evaluation includes B-mode imaging, spectral Doppler, color Doppler, and power Doppler as needed of all accessible portions of each vessel. Bilateral testing is considered an integral part of a complete examination. Limited examinations for reoccurring indications may be performed as noted. The reflux portion of the exam is performed with the patient in reverse Trendelenburg.  +---------+---------------+---------+-----------+----------+--------------+ RIGHT    CompressibilityPhasicitySpontaneityPropertiesThrombus Aging +---------+---------------+---------+-----------+----------+--------------+ CFV      Full                                                        +---------+---------------+---------+-----------+----------+--------------+ SFJ      Full                                                        +---------+---------------+---------+-----------+----------+--------------+ FV Prox  Full                                                        +---------+---------------+---------+-----------+----------+--------------+  FV Mid   Full                                                        +---------+---------------+---------+-----------+----------+--------------+ FV DistalFull                                                        +---------+---------------+---------+-----------+----------+--------------+ POP      Full                                                        +---------+---------------+---------+-----------+----------+--------------+ PTV       Full                                                        +---------+---------------+---------+-----------+----------+--------------+ PERO     Full                                                        +---------+---------------+---------+-----------+----------+--------------+ GSV      Full                                                        +---------+---------------+---------+-----------+----------+--------------+    Findings reported to routed in epic at 12:20 pm.  Summary: RIGHT: - There is no evidence of deep vein thrombosis in the lower extremity. - There is no evidence of superficial venous thrombosis.  LEFT: - No evidence of common femoral vein obstruction.   *See table(s) above for measurements and observations. Electronically signed by Lemar Livings MD on 08/19/2023 at 5:33:49 PM.    Final    DG HIP UNILAT WITH PELVIS 2-3 VIEWS RIGHT Result Date: 08/18/2023 CLINICAL DATA:  Atraumatic right hip pain EXAM: DG HIP (WITH OR WITHOUT PELVIS) 2-3V RIGHT COMPARISON:  None Available. FINDINGS: There is no evidence of hip fracture or dislocation. Mild osteoarthritis of the bilateral hips, right greater than left. Atherosclerotic vascular calcifications. IMPRESSION: Mild osteoarthritis of the bilateral hips, right greater than left. No acute findings. Electronically Signed   By: Duanne Guess D.O.   On: 08/18/2023 12:54   DG Chest 2 View Result Date: 07/27/2023 CLINICAL DATA:  Cough. EXAM: CHEST - 2 VIEW COMPARISON:  05/15/2023 FINDINGS: Stable cardiomegaly. 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 Both lungs are clear. IMPRESSION: Stable cardiomegaly.  No active lung disease. Electronically Signed   By: Danae Orleans M.D.   On: 07/27/2023 14:34   CUP PACEART REMOTE DEVICE CHECK Result Date: 07/08/2023 Scheduled remote reviewed. Normal device function.  Next remote 91 days. [facility], [facility]    Assessment & Plan:  Pelvic pain in female -     CT ABDOMEN PELVIS WO CONTRAST; Future -      CBC with Differential/Platelet; Future -     C-reactive protein; Future -     Urinalysis, Routine w reflex microscopic; Future -     CULTURE, URINE COMPREHENSIVE; Future  Stage 3b chronic kidney disease (CKD) (HCC)- Renal function is declining. Will avoid nephrotoxic agents  -     Basic metabolic panel; Future -     Urinalysis, Routine w reflex microscopic; Future  Thrombocytopenia (HCC)- PLTs are normal. -     CBC with Differential/Platelet; Future  Type 2 diabetes mellitus with stage 4 chronic kidney disease, without long-term current use of insulin (HCC) -     Basic metabolic panel; Future -     Hemoglobin A1c; Future     Follow-up: Return in about 3 months (around 11/23/2023).  Sanda Linger, MD

## 2023-08-25 NOTE — Patient Instructions (Signed)
Pelvic Pain, Female Pelvic pain is pain in your lower belly (abdomen), below your belly button and between your hips. The pain may: Start all of a sudden (be acute). Keep coming back (be recurring). Last a long time (become chronic). Pelvic pain that lasts longer than 6 months is called chronic pelvic pain. There are many causes of pelvic pain. Sometimes the cause of pelvic pain is not known. Follow these instructions at home:  Take over-the-counter and prescription medicines only as told by your doctor. Rest as told by your doctor. Do not have sex if it hurts. Keep a journal of your pelvic pain. Write down: When the pain started. Where the pain is located. What seems to make the pain better or worse, such as food or your monthly period (menstrual cycle). Any symptoms you have along with the pain. Keep all follow-up visits. Contact a doctor if: Medicine does not help your pain, or your pain comes back. You have new symptoms. You have unusual discharge or bleeding from your vagina. You have a fever or chills. You are having trouble pooping (constipation). You have blood in your pee (urine) or poop (stool). Your pee smells bad. You feel weak or light-headed. Get help right away if: You have sudden pain that is very bad. You have very bad pain and also have any of these symptoms: A fever. Feeling like you may vomit (nauseous). Vomiting. Being very sweaty. You faint. These symptoms may be an emergency. Get help right away. Call your local emergency services (911 in the [place] Do not wait to see if the symptoms will go away. Do not drive yourself to the hospital. Summary Pelvic pain is pain in your lower belly (abdomen), below your belly button and between your hips. There are many causes of pelvic pain. Keep a journal of your pelvic pain. This information is not intended to replace advice given to you by your health care provider. Make sure you discuss any questions you have with  your health care provider. Document Revised: 11/27/2020 Document Reviewed: 11/27/2020 Elsevier Patient Education  2024 [employer]  

## 2023-08-26 ENCOUNTER — Encounter: Payer: Self-pay | Admitting: Internal Medicine

## 2023-08-27 LAB — CULTURE, URINE COMPREHENSIVE: RESULT:: NO GROWTH

## 2023-08-28 ENCOUNTER — Ambulatory Visit: Payer: Medicare PPO

## 2023-08-28 ENCOUNTER — Ambulatory Visit
Admission: RE | Admit: 2023-08-28 | Discharge: 2023-08-28 | Disposition: A | Discharge status: Home / Self Care | Payer: Medicare PPO | Source: Ambulatory Visit | Attending: Internal Medicine | Admitting: Internal Medicine

## 2023-08-28 DIAGNOSIS — N261 Atrophy of kidney (terminal): Secondary | ICD-10-CM | POA: Diagnosis not present

## 2023-08-28 DIAGNOSIS — I700 Atherosclerosis of aorta: Secondary | ICD-10-CM | POA: Diagnosis not present

## 2023-08-28 DIAGNOSIS — K862 Cyst of pancreas: Secondary | ICD-10-CM | POA: Diagnosis not present

## 2023-08-28 DIAGNOSIS — R102 Pelvic and perineal pain: Secondary | ICD-10-CM

## 2023-08-28 DIAGNOSIS — Z90710 Acquired absence of both cervix and uterus: Secondary | ICD-10-CM | POA: Diagnosis not present

## 2023-09-06 ENCOUNTER — Encounter: Payer: Self-pay | Admitting: Internal Medicine

## 2023-09-07 ENCOUNTER — Other Ambulatory Visit: Payer: Self-pay | Admitting: Internal Medicine

## 2023-09-07 DIAGNOSIS — K8689 Other specified diseases of pancreas: Secondary | ICD-10-CM | POA: Insufficient documentation

## 2023-09-08 ENCOUNTER — Ambulatory Visit: Payer: Medicare PPO

## 2023-09-08 DIAGNOSIS — Z7901 Long term (current) use of anticoagulants: Secondary | ICD-10-CM

## 2023-09-08 LAB — POCT INR: INR: 1.8 — AB (ref 2.0–3.0)

## 2023-09-08 NOTE — Patient Instructions (Addendum)
 Pre visit review using our clinic review tool, if applicable. No additional management support is needed unless otherwise documented below in the visit note.  Increase dose today to take 1 tablets and then change weekly dose to take 1 tablets daily except take 1/2 tablet on Tuesday and Saturday. Recheck in 2 week.

## 2023-09-08 NOTE — Progress Notes (Signed)
Pt's daughter reports pt not eating as much in the last 2 weeks. Increase dose today to take 1 tablets and then change weekly dose to take 1 tablets daily except take 1/2 tablet on Tuesday and Saturday. Recheck in 2 week.

## 2023-09-17 ENCOUNTER — Ambulatory Visit: Payer: Medicare PPO | Admitting: Nurse Practitioner

## 2023-09-17 ENCOUNTER — Other Ambulatory Visit (INDEPENDENT_AMBULATORY_CARE_PROVIDER_SITE_OTHER): Payer: Medicare PPO

## 2023-09-17 ENCOUNTER — Encounter: Payer: Self-pay | Admitting: Nurse Practitioner

## 2023-09-17 VITALS — BP 118/62 | HR 71 | Ht 63.0 in | Wt 164.0 lb

## 2023-09-17 DIAGNOSIS — Z7901 Long term (current) use of anticoagulants: Secondary | ICD-10-CM

## 2023-09-17 DIAGNOSIS — K8689 Other specified diseases of pancreas: Secondary | ICD-10-CM | POA: Diagnosis not present

## 2023-09-17 LAB — COMPREHENSIVE METABOLIC PANEL
ALT: 9 U/L (ref 0–35)
AST: 14 U/L (ref 0–37)
Albumin: 4.2 g/dL (ref 3.5–5.2)
Alkaline Phosphatase: 167 U/L — ABNORMAL HIGH (ref 39–117)
BUN: 32 mg/dL — ABNORMAL HIGH (ref 6–23)
CO2: 33 meq/L — ABNORMAL HIGH (ref 19–32)
Calcium: 9.5 mg/dL (ref 8.4–10.5)
Chloride: 100 meq/L (ref 96–112)
Creatinine, Ser: 2.19 mg/dL — ABNORMAL HIGH (ref 0.40–1.20)
GFR: 20.82 mL/min — ABNORMAL LOW (ref >60.00)
Glucose, Bld: 150 mg/dL — ABNORMAL HIGH (ref 70–99)
Potassium: 3.7 meq/L (ref 3.5–5.1)
Sodium: 142 meq/L (ref 135–145)
Total Bilirubin: 0.9 mg/dL (ref 0.2–1.2)
Total Protein: 7.4 g/dL (ref 6.0–8.3)

## 2023-09-17 LAB — LIPASE: Lipase: 26 U/L (ref 11.0–59.0)

## 2023-09-17 LAB — CBC
HCT: 35.9 % — ABNORMAL LOW (ref 36.0–46.0)
Hemoglobin: 11.7 g/dL — ABNORMAL LOW (ref 12.0–15.0)
MCHC: 32.5 g/dL (ref 30.0–36.0)
MCV: 97.5 fL (ref 78.0–100.0)
Platelets: 131 10*3/uL — ABNORMAL LOW (ref 150.0–400.0)
RBC: 3.69 Mil/uL — ABNORMAL LOW (ref 3.87–5.11)
RDW: 16.5 % — ABNORMAL HIGH (ref 11.5–15.5)
WBC: 5.2 10*3/uL (ref 4.0–10.5)

## 2023-09-17 LAB — PROTIME-INR
INR: 2.6 {ratio} — ABNORMAL HIGH (ref 0.8–1.0)
Prothrombin Time: 26.4 s — ABNORMAL HIGH (ref 9.6–13.1)

## 2023-09-17 NOTE — Progress Notes (Signed)
ASSESSMENT    Brief Narrative:  83 y.o.  female known to Dr. Orvan Falconer  with a past medical history not limited to GERD, colon polyps, chronic anemia, DM2, asthma, OSA on CPAP, CKD3b, complete heart block status post pacemaker placement , diastolic heart failure,  atrial fibrillation, depression, appendectomy, cholecystectomy.  See PMH for any additional medical & surgical history . Patient referred by PCP for evaluation of pancreatic mass.  Dr. Chales Abrahams will assume patient's GI care since Dr. Orvan Falconer is no longer with the practice  Abnormal pancreas on CT scan.  Non contrast showing significant enlargement of a cystic lesion emanating from the distal body of the pancreas measuring roughly 4.2 x 3.4 x 3.5 cm and demonstrating internal cystic density. This previously measured approximately 1.3-1.5 cm in 2015. Adjacent cystic lesion just anterior to the dominant cystic lesion measures approximately 1.4 cm.   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 Maybe 4.   PLAN   --CBC, CMP, lipase, CA 19-9, CEA.  -- Not a good candidate for CT scan with IV contrast due to her advanced kidney disease.  Not candidate most likely for MR due to pacemaker.  Case will be discussed with our advanced biliary endoscopist, Dr. Meridee Score but it is possible she may need an EUS.  Long discussion with patient and family about the risks of EUS with possible FNA not limited to cardiopulmonary complications of sedation, bleeding, infection, perforation,and pancreatitis.  The patient and family are aware of the increased risk of procedures, especially  given her multiple comorbidities but they would like to proceed with further workup.  --Will get back with patient / family with lab results and after discussion with Dr. Meridee Score    HPI    Chief complaint : Pancreatic lesion on CT scan   Patient was last seen in the office 11/15/2020 for evaluation of dysphagia.  She was seen by PCP on 08/25/2023 for pelvic pain.  Noncontrast CT scan ordered and showed an enlarging cystic lesion involving body of pancreas  Patient has recently been having some low back pain.  A noncontrast CT scan shows an enlarging cystic lesion of the pancreas in addition to a smaller adjacent lesion.  The lesion was seen on imaging about 9 years ago but has definitely enlarged.  Patient has not been having any abdominal pain.  No weight loss.  No nausea or vomiting.  She actually feels pretty well except for vertigo.  She has a history of heart failure.  No lower extremity swelling today.  She does not have orthopnea    CT AP wo contrast 08/28/23 1. Significant enlargement of a cystic lesion emanating from the distal body of the pancreas measuring roughly 4.2 x 3.4 x 3.5 cm and demonstrating internal cystic density. This previously measured approximately 1.3-1.5 cm in 2015. Adjacent cystic lesion just anterior to the dominant cystic lesion measures approximately 1.4 cm. See above discussion regarding recommendations. Given pacemaker and chronic kidney disease, referral to gastroenterology for possible EUS characterization may be necessary given limitations for further imaging characterization. 2. Small amount of free fluid in the dependent pelvis. No findings to suggest obvious loculated abscess. 3. Bilateral renal atrophy. 4. Aortic atherosclerosis.   GI History / Pertinent GI Studies   **All endoscopic studies may not be included here    EGD  09/14/2020:  Normal esophagus.  Biopsied.  Gastritis with hemorrhage, biopsied..  Multiple gastric polyps.  Biopsied.  Small duodenal polyp resected and retrieved.  Otherwise normal examined duodenum, biopsied.  Colonoscopy 09/14/2020: - Preparation of the colon was poor. - Diverticulosis in the sigmoid colon  and in the descending colon. - One 8 mm polyp in the sigmoid colon, removed with a cold snare. Resected and retrieved. - One 6 mm polyp at the hepatic flexure, removed with a cold snare. Resected and retrieved. - Three 1 to 2 mm polyps in the cecum, removed with a cold snare. Resected and retrieved. - The examination was otherwise normal on direct and retroflexion views.   1. Surgical [P], duodenal polyp bx's - SCANT FRAGMENTS OF POLYPOID DUODENAL MUCOSA WITH NO SPECIFIC HISTOPATHOLOGIC CHANGES - NEGATIVE FOR DYSPLASIA OR MALIGNANCY 2. Surgical [P], duodenal bx's - DUODENAL MUCOSA WITH REACTIVE CHANGES - NEGATIVE FOR INCREASED INTRAEPITHELIAL LYMPHOCYTES OR VILLOUS ARCHITECTURAL CHANGES 3. Surgical [P], fundus, gastric antrum and gastric body - GASTRIC ANTRAL AND OXYNTIC MUCOSA WITH MILD NONSPECIFIC REACTIVE GASTROPATHY - WARTHIN STARRY STAIN IS NEGATIVE FOR HELICOBACTER PYLORI 4. Surgical [P], gastric polyp bx's - FUNDIC GLAND POLYP(S) - NEGATIVE FOR DYSPLASIA 5. Surgical [P], esophageal - ESOPHAGEAL SQUAMOUS AND CARDIAC MUCOSA WITH NO SPECIFIC HISTOPATHOLOGIC CHANGES - NEGATIVE FOR INTESTINAL METAPLASIA OR DYSPLASIA 6. Surgical [P], colon, cecum, hepatic flexure, 36cm, polyp (multiple) - TUBULAR ADENOMA(S) WITHOUT HIGH-GRADE DYSPLASIA OR MALIGNANCY - HYPERPLASTIC POLYP(S)       Latest Ref Rng & Units 06/23/2023   12:00 AM 05/17/2023   11:16 AM 05/16/2023   12:20 AM  Hepatic Function  Total Protein 6.5 - 8.1 g/dL   6.3   Albumin 3.5 - 5.0 4.3     3.2  3.2   AST 15 - 41 U/L   21   ALT 0 - 44 U/L   12   Alk Phosphatase 38 - 126 U/L   145   Total Bilirubin 0.3 - 1.2 mg/dL   1.6      This result is from an external source.       Latest Ref Rng & Units 08/25/2023   10:06 AM 06/23/2023   12:00 AM 05/18/2023    4:25 AM  CBC  WBC 4.0 - 10.5 K/uL 7.8  5.9     8.3   Hemoglobin 12.0 - 15.0 g/dL 16.1  09.6     04.5   Hematocrit 36.0 - 46.0 % 34.7  36     31.7   Platelets 150.0  - 400.0 K/uL 185.0  129     142      This result is from an external source.     Past Medical History:  Diagnosis Date   Allergic rhinitis    Asthma    NOS w/ acute exacerbation   Blood transfusion without reported diagnosis    CKD (chronic kidney disease) stage 3, GFR 30-59 ml/min (HCC) 09/12/2018   Colon polyps    TUBULAR ADENOMAS AND HYPERPLASTIC   Complete heart block (HCC)    requiring PPM (MDT) post surgical myomectomy at [facility],  leads are epicardial with abdominal implant, high ventricular threshold at implant   COPD (chronic obstructive pulmonary disease) (HCC)    Depressive disorder    Diastolic dysfunction    DM (diabetes mellitus) (HCC)    Gallstones    GERD (gastroesophageal reflux disease)    Gout 08/20/2012   Heart murmur    Hyperpotassemia    Hypersomnia    Hypertension  Hypertrophic cardiomyopathy (HCC)    s/p surgical myomectomy at [facility] 11/12 complicated by septal VSD post procedure requiring reoperation with patch closure and tricuspid valve replacement   Kidney stones    Myocardial infarction (HCC) 2011   Obstructive sleep apnea    persistent daytime sleepiness despite cpap   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   Psoriasis    Pyuria    Renal insufficiency    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   Typical atrial flutter (HCC) 9/15    Past Surgical History:  Procedure Laterality Date   ABDOMINAL HYSTERECTOMY     APPENDECTOMY     BREAST CYST EXCISION     CARDIOVERSION N/A 08/01/2014   Procedure: CARDIOVERSION;  Surgeon: Vesta Mixer, MD;  Location[facility] ENDOSCOPY;  Service: Cardiovascular;  Laterality: N/A;   CESAREAN SECTION     CHOLECYSTECTOMY     COLONOSCOPY     EYE SURGERY  01/16/2016   left eye lens implant   EYE SURGERY  01/09/2016   right eye lens implant   INSERT / REPLACE / REMOVE PACEMAKER  07/13/2012   PACEMAKER INSERTION  07/02/2011   epicardial wires with abdominal implant at [facility] 12/12,  high ventricular lead threshold at implant  per Dr Christin Fudge   PERMANENT PACEMAKER INSERTION N/A 07/13/2012   Procedure: PERMANENT PACEMAKER INSERTION;  Surgeon: Hillis Range, MD;  Location: [facility] CATH LAB;  Service: Cardiovascular;  Laterality: N/A;   POLYPECTOMY     septal myomectomy for hypertrophic CM  06/23/2011   by Dr Silvestre Mesi at [facility], complicated by septal VSD requiring patch repair and tricuspid valve replacement   TONSILLECTOMY     TOOTH EXTRACTION     TRICUSPID VALVE REPLACEMENT  06/2011   Medtronic 27mm Mosaic tissue valve   VSD REPAIR  06/2011    Family History  Problem Relation Age of Onset   Hypertension Daughter    Heart disease Maternal Grandfather    Heart disease Paternal Grandfather    Bladder Cancer Father    Prostate cancer Father    Cancer Father    Varicose Veins Father    Heart attack Father    Breast cancer Mother    Dementia Mother    Hypertension Mother    Cancer Mother    Ovarian cancer Maternal Aunt    Pancreatic cancer Cousin    Breast cancer Paternal Aunt    Dementia Maternal Aunt        x 2   Dementia Maternal Uncle        x 2   Colon cancer Neg Hx    Rectal cancer Neg Hx    Stomach cancer Neg Hx    Esophageal cancer Neg Hx     Current Medications, Allergies, Family History and Social History were reviewed in [employer] electronic medical record.     Current Outpatient Medications  Medication Sig Dispense Refill   albuterol (VENTOLIN HFA) 108 (90 Base) MCG/ACT inhaler Inhale 2 puffs into the lungs every 6 (six) hours as needed for wheezing or shortness of breath. 18 g 0   allopurinol (ZYLOPRIM) 100 MG tablet TAKE ONE TABLET BY MOUTH DAILY 90 tablet 1   Betamethasone Valerate 0.12 % foam Apply 1 application topically daily. (Patient taking differently: Apply 1 application  topically once a week.) 100 g 0   buPROPion (WELLBUTRIN XL) 300 MG 24 hr tablet Take 1 tablet (300 mg total) by mouth daily. 90 tablet 0   cephALEXin (KEFLEX) 250 MG capsule Take 1  capsule (250 mg total) by mouth  3 (three) times daily. 15 capsule 0   famotidine (PEPCID) 40 MG tablet TAKE ONE TABLET BY MOUTH DAILY 90 tablet 1   ferrous sulfate 325 (65 FE) MG tablet Take 1 tablet (325 mg total) by mouth 2 (two) times daily with a meal. (Patient taking differently: Take 325 mg by mouth daily with breakfast.) 180 tablet 1   guaiFENesin-dextromethorphan (ROBITUSSIN DM) 100-10 MG/5ML syrup Take 10 mLs by mouth every 6 (six) hours as needed for cough. 237 mL 1   hydrocortisone (ANUSOL-HC) 25 MG suppository Place 1 suppository (25 mg total) rectally 2 (two) times daily. 12 suppository 1   ipratropium (ATROVENT) 0.03 % nasal spray USE 2 SPRAYS IN EACH NOSTRIL 3 TIMES A DAY AS NEEDED. (Patient taking differently: Place 2 sprays into both nostrils daily as needed (nose bleed).) 30 mL 0   ketoconazole (NIZORAL) 2 % cream Apply 1 Application topically 2 (two) times daily. 60 g 2   lamoTRIgine (LAMICTAL) 25 MG tablet Take 1 tablet (25 mg total) by mouth daily. 90 tablet 0   linaclotide (LINZESS) 72 MCG capsule TAKE 1 CAPSULE EVERY DAY BEFORE BREAKFAST 90 capsule 0   metoprolol tartrate (LOPRESSOR) 25 MG tablet TAKE ONE TABLET BY MOUTH TWICE DAILY 180 tablet 1   modafinil (PROVIGIL) 100 MG tablet Take 1 tablet (100 mg total) by mouth daily. (Patient taking differently: Take 100 mg by mouth daily as needed (sleep disorder).) 30 tablet 5   Pitavastatin Calcium 2 MG TABS TAKE ONE TABLET BY MOUTH DAILY 90 tablet 1   PRESCRIPTION MEDICATION 1 Dose by Other route at bedtime as needed (sleep). CPAP     Propylene Glycol (SYSTANE BALANCE OP) Place 1 drop into both eyes 4 (four) times daily as needed (dry eyes).     REXULTI 0.25 MG TABS tablet TAKE 1 TABLET BY MOUTH DAILY 90 tablet 0   SYMBICORT 80-4.5 MCG/ACT inhaler USE 2 PUFFS TWICE A DAY. (Patient taking differently: 2 puffs 2 (two) times daily.) 30.6 g 5   torsemide (DEMADEX) 20 MG tablet Take 1 tablet (20 mg total) by mouth daily. 90 tablet 1   triamcinolone ointment (KENALOG)  0.1 % Apply 1 Application topically 2 (two) times daily. Use every day - bid 80 g 1   Vilazodone HCl (VIIBRYD) 40 MG TABS TAKE ONE TABLET BY MOUTH DAILY 90 tablet 1   warfarin (COUMADIN) 2.5 MG tablet TAKE 1 TABLET BY MOUTH DAILY EXCEPT TAKE 1/2 TABLET ON WEDNESDAYS OR AS DIRECTED BY ANTICOAGULATION CLINIC 180 tablet 0   No current facility-administered medications for this visit.    Review of Systems: No chest pain. No shortness of breath. No urinary complaints.    Physical Exam  There were no vitals filed for this visit. Wt Readings from Last 3 Encounters:  08/25/23 172 lb 9.6 oz (78.3 kg)  08/18/23 177 lb (80.3 kg)  07/27/23 173 lb (78.5 kg)    BP 118/62   Pulse 71   Ht 5\' 3"  (1.6 m)   Wt 164 lb (74.4 kg)   BMI 29.05 kg/m  Constitutional:  Pleasant, generally well appearing female in no acute distress. Psychiatric: Normal mood and affect. Behavior is normal. EENT: Pupils normal.  Conjunctivae are normal. No scleral icterus. Neck supple.  Cardiovascular: Normal rate, regular rhythm.  Pulmonary/chest: Effort normal and breath sounds normal.  Bibasilar inspiratory crackles  Abdominal: Soft, nondistended, nontender. Bowel sounds active throughout. There are no masses palpable. No hepatomegaly. Neurological: Alert and  oriented to person place and time.  Extremities: no edema  Willette Cluster, NP  09/17/2023, 9:23 AM  Cc:  Etta Grandchild, MD

## 2023-09-17 NOTE — Patient Instructions (Signed)
Your provider has requested that you go to the basement level for lab work before leaving today. Press "B" on the elevator. The lab is located at the first door on the left as you exit the elevator.  Due to recent changes in healthcare laws, you may see the results of your imaging and laboratory studies on MyChart before your provider has had a chance to review them.  We understand that in some cases there may be results that are confusing or concerning to you. Not all laboratory results come back in the same time frame and the provider may be waiting for multiple results in order to interpret others.  Please give [place] 48 hours in order for your provider to thoroughly review all the results before contacting the office for clarification of your results.   I appreciate the opportunity to care for you. Willette Cluster, NP

## 2023-09-18 ENCOUNTER — Telehealth: Payer: Self-pay

## 2023-09-18 ENCOUNTER — Ambulatory Visit (INDEPENDENT_AMBULATORY_CARE_PROVIDER_SITE_OTHER): Payer: Medicare PPO

## 2023-09-18 DIAGNOSIS — Z7901 Long term (current) use of anticoagulants: Secondary | ICD-10-CM | POA: Diagnosis not present

## 2023-09-18 LAB — CEA: CEA: 4.6 ng/mL — ABNORMAL HIGH

## 2023-09-18 LAB — CANCER ANTIGEN 19-9: CA 19-9: 12 U/mL (ref <34)

## 2023-09-18 NOTE — Progress Notes (Signed)
No POCT INR was performed today. Pt's son called to report pt had apt with GI yesterday and they checked a lab INR there. They are looking into surgery options. Holly Simmons reports GI is going to contact cardiology concerning a Lovenox bridge. Advised to let the coumadin clinic know when or if surgery is scheduled. Holly Simmons verbalized understanding.  Advised Russel of dosing and Rs coumadin clinic apt for 4 weeks out. Holly Simmons verbalized understanding. Continue 1 tablets daily except take 1/2 tablet on Tuesday and Saturday. Recheck in 4 week.

## 2023-09-18 NOTE — Telephone Encounter (Signed)
-----   Message from [facility] sent at 09/18/2023  4:44 PM EST ----- Sarie Stall, Please move forward with scheduling upper EUS for this patient for pancreatic cyst evaluation and FNA. If her CA 19-9 comes back very elevated, we will have to see how we can try to fit her and we will get her referred elsewhere.  Gunnar Fusi will let us know. GM ----- Message ----- From: Meredith Pel, NP Sent: 09/18/2023   4:43 PM EST To: Lemar Lofty., MD  GM,  Thanks for help. I contacted Tenasia Aull and asked to schedule the EUS but I guess she needs to get the request from you if you would please let her know.   I was going to go ahead and get this scheduled.  If the CA 19-9 came back markedly elevated then I would let you know and we can see about expediting procedure.   Thanks, Pg ----- Message ----- From: Lemar Lofty., MD Sent: 09/17/2023   3:54 PM EST To: Meredith Pel, NP; Lynann Bologna, MD  PG, EUS is reasonable. We can get her scheduled, my next available. Looks like symptomatically she is not having issues. If her CA 19-9 comes back elevated, let me know and we may have to expedite her procedure versus getting her referred elsewhere if I cannot fit her in sooner. Let me know if they want to proceed in that fashion or you want to wait for the CA 19-9 in the next 1 to 2 days to return. Thanks. GM ----- Message ----- From: Meredith Pel, NP Sent: 09/17/2023  10:36 AM EST To: Lemar Lofty., MD  Hi,  When you get a chance can you please review this patient's CT scan. Need an EUS? She is seeing me in clinic today.   Thanks  FYI she will be RG's patient  thanks

## 2023-09-18 NOTE — Telephone Encounter (Signed)
Benita Gutter, pt's son, reports pt had a GI OV yesterday and they are discussing if an endoscopic [place] will be performed. They will want pt to hold warfarin for 5 days. He let them know that pt is normally on a Lovenox bridge for surgery. They said they will contact cardiology if surgery is scheduled, for Lovenox bridge instructions. Advised to let the coumadin clinic know of any upcoming surgery. Benita Gutter verbalized understanding.   GI also performed a lab INR. Used this for dosing today and RS coumadin clinic apt from next week to 4 weeks out.

## 2023-09-18 NOTE — Patient Instructions (Addendum)
Pre visit review using our clinic review tool, if applicable. No additional management support is needed unless otherwise documented below in the visit note.  Continue 1 tablets daily except take 1/2 tablet on Tuesday and Saturday. Recheck in 4 week.

## 2023-09-21 ENCOUNTER — Other Ambulatory Visit (HOSPITAL_BASED_OUTPATIENT_CLINIC_OR_DEPARTMENT_OTHER): Payer: Self-pay

## 2023-09-21 ENCOUNTER — Other Ambulatory Visit: Payer: Self-pay

## 2023-09-21 DIAGNOSIS — K8689 Other specified diseases of pancreas: Secondary | ICD-10-CM

## 2023-09-21 NOTE — Telephone Encounter (Signed)
EUS has been set up for 10/28/23 at 1115 am at [facility] with GM  Anti coag letter sent for coumadin hold  Left message on machine to call back

## 2023-09-22 ENCOUNTER — Other Ambulatory Visit: Payer: Self-pay | Admitting: Internal Medicine

## 2023-09-22 ENCOUNTER — Ambulatory Visit: Payer: Medicare PPO

## 2023-09-22 DIAGNOSIS — I5032 Chronic diastolic (congestive) heart failure: Secondary | ICD-10-CM

## 2023-09-22 DIAGNOSIS — E785 Hyperlipidemia, unspecified: Secondary | ICD-10-CM

## 2023-09-22 DIAGNOSIS — G301 Alzheimer's disease with late onset: Secondary | ICD-10-CM

## 2023-09-22 DIAGNOSIS — R600 Localized edema: Secondary | ICD-10-CM

## 2023-09-22 DIAGNOSIS — F332 Major depressive disorder, recurrent severe without psychotic features: Secondary | ICD-10-CM

## 2023-09-22 NOTE — Telephone Encounter (Signed)
Pt's son reports pt is scheduled for upper EUS for 3/26. He is not sure if the request for warfarin hold is being sent to cardiology or primary care. Advised this nurse will f/u with PCP. Next INR check in coumadin clinic is 3/11. Advised if any other changes to contact the coumadin clinic. Perlie Gold verbalized understanding.

## 2023-09-22 NOTE — Telephone Encounter (Signed)
EUS scheduled, pt son instructed and medications reviewed.  Patient instructions mailed to home.  Patient to call with any questions or concerns.   The son is aware that we will also be calling when the ok to hold coumadin is received.    The pt has been advised of the information and verbalized understanding.

## 2023-09-24 NOTE — Progress Notes (Signed)
 83 y.o. history of HOCM. Multiple complications with surgery at [facility] 06/23/11 by Dr Silvestre Mesi. 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  All other systems reviewed and negative  General: BP 100/68   Pulse 92   Ht 5\' 3"  (1.6 m)   Wt 166 lb 12.8 oz (75.7 kg)   SpO2 97%   BMI 29.55 kg/m   Affect appropriate Elderly female  HEENT: normal Neck supple with no adenopathy JVP normal no bruits no thyromegaly Lungs clear with no wheezing and good diaphragmatic motion Heart:  S1/S2 no murmur, no rub, gallop or click PMI  normal PPM epicardial generator abdomen and PPM CS under left clavicle Abdomen: benighn, BS positve, no tenderness, no AAA no bruit.  No HSM or HJR Distal pulses intact with no bruits Some varicosities in LE;s  Speech and facial expression now abnormal Clenches hands no focal abnormality Skin warm and dry No muscular weakness   Current Outpatient Medications  Medication Sig Dispense Refill   albuterol (VENTOLIN HFA) 108 (90 Base) MCG/ACT inhaler Inhale 2 puffs into the lungs every 6 (six) hours as needed for wheezing or shortness of breath. 18 g 0   allopurinol (ZYLOPRIM) 100 MG tablet TAKE ONE TABLET BY MOUTH DAILY 90 tablet 1   Betamethasone Valerate 0.12 % foam Apply 1 application topically daily. (Patient taking differently: Apply 1 application  topically once a week.) 100 g 0   buPROPion (WELLBUTRIN XL) 300 MG 24 hr tablet Take 1 tablet (300 mg total) by mouth daily. 90 tablet 0   cephALEXin (KEFLEX) 250 MG capsule Take 1 capsule (250 mg total) by mouth 3 (three) times daily. 15 capsule 0   famotidine (PEPCID) 40 MG tablet TAKE ONE TABLET BY MOUTH DAILY 90 tablet 1   ferrous sulfate 325 (65 FE) MG tablet Take 1 tablet (325 mg total) by mouth 2 (two) times daily with a meal. (Patient taking differently: Take 325 mg by mouth daily with breakfast.) 180 tablet 1   guaiFENesin-dextromethorphan (ROBITUSSIN DM) 100-10 MG/5ML syrup Take 10 mLs by  mouth every 6 (six) hours as needed for cough. 237 mL 1   hydrocortisone (ANUSOL-HC) 25 MG suppository Place 1 suppository (25 mg total) rectally 2 (two) times daily. 12 suppository 1   ipratropium (ATROVENT) 0.03 % nasal spray USE 2 SPRAYS IN EACH NOSTRIL 3 TIMES A DAY AS NEEDED. (Patient taking differently: Place 2 sprays into both nostrils daily as needed (nose bleed).) 30 mL 0   ketoconazole (NIZORAL) 2 % cream Apply 1 Application topically 2 (two) times daily. 60 g 2   lamoTRIgine (LAMICTAL) 25 MG tablet Take 1 tablet (25 mg total) by mouth daily.  90 tablet 0   linaclotide (LINZESS) 72 MCG capsule TAKE 1 CAPSULE EVERY DAY BEFORE BREAKFAST 90 capsule 0   metoprolol tartrate (LOPRESSOR) 25 MG tablet TAKE ONE TABLET BY MOUTH TWICE DAILY 180 tablet 1   modafinil (PROVIGIL) 100 MG tablet Take 1 tablet (100 mg total) by mouth daily. (Patient taking differently: Take 100 mg by mouth daily as needed (sleep disorder).) 30 tablet 5   Pitavastatin Calcium 2 MG TABS TAKE ONE TABLET BY MOUTH DAILY 90 tablet 1   PRESCRIPTION MEDICATION 1 Dose by Other route at bedtime as needed (sleep). CPAP     Propylene Glycol (SYSTANE BALANCE OP) Place 1 drop into both eyes 4 (four) times daily as needed (dry eyes).     REXULTI 0.25 MG TABS tablet TAKE 1 TABLET BY MOUTH DAILY 90 tablet 0   SYMBICORT 80-4.5 MCG/ACT inhaler USE 2 PUFFS TWICE A DAY. (Patient taking differently: 2 puffs 2 (two) times daily.) 30.6 g 5   torsemide (DEMADEX) 20 MG tablet Take 1 tablet (20 mg total) by mouth daily. 90 tablet 1   triamcinolone ointment (KENALOG) 0.1 % Apply 1 Application topically 2 (two) times daily. Use every day - bid 80 g 1   Vilazodone HCl (VIIBRYD) 40 MG TABS TAKE ONE TABLET BY MOUTH DAILY 90 tablet 1   warfarin (COUMADIN) 2.5 MG tablet TAKE 1 TABLET BY MOUTH DAILY EXCEPT TAKE 1/2 TABLET ON WEDNESDAYS OR AS DIRECTED BY ANTICOAGULATION CLINIC 180 tablet 0   No current facility-administered medications for this visit.    Allergies  Petrolatum-zinc oxide; Sulfa antibiotics; Sulfonamide derivatives; Tetanus toxoid; Tetanus toxoids; Other; Nsaids; and Tetanus toxoid, adsorbed  Electrocardiogram:  AV pacing 90   Assessment and Plan  HOCM: post myectomy no residual gradients  VSD now sealed with no VSD murmur on exam  Pacer  Thresholds ok f/u [facility]  Changed to VVIR mode CS pacer Is under left clavicle and she has a 2nd abdominal device programmed as VVI backup Normal PACEART 07/07/23 TV replacement  SBE creap in diastolic gradient to mean 7.7 mmhg mild residual TR   Anticoagulation  PAF  INR Rx on coumadin no bleeding issues INR 2.2 Rx PPM suggests PAF burden about 10%  A/CRF:  Baseline Cr around 2.1 f/u [facility]  Thrombocytopenia:  PLT 185  improved  will need close watching on coumadin  Aortic Stenosis :  mild mean gradient 9 mmHg with mild AR  Dementia:  to me marked change in speech and posturing refer to [facility] for further w/u GI:  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 Unable to do MRI with PPM. Referred to GI for possible EUS Hold coumadin 4 days before procedure   F/U with EP check pacer in person Refer to Neuro for rapidly progressive dementia  F/U with me in a year     Charlton Haws

## 2023-10-01 ENCOUNTER — Encounter: Payer: Self-pay | Admitting: Cardiovascular Disease

## 2023-10-01 ENCOUNTER — Ambulatory Visit: Payer: Medicare PPO | Attending: Cardiovascular Disease | Admitting: Cardiovascular Disease

## 2023-10-01 VITALS — BP 100/68 | HR 92 | Ht 63.0 in | Wt 166.8 lb

## 2023-10-01 DIAGNOSIS — Z950 Presence of cardiac pacemaker: Secondary | ICD-10-CM

## 2023-10-01 DIAGNOSIS — I421 Obstructive hypertrophic cardiomyopathy: Secondary | ICD-10-CM

## 2023-10-01 DIAGNOSIS — Z01810 Encounter for preprocedural cardiovascular examination: Secondary | ICD-10-CM | POA: Diagnosis not present

## 2023-10-01 DIAGNOSIS — Q2542 Hypoplasia of aorta: Secondary | ICD-10-CM | POA: Diagnosis not present

## 2023-10-01 DIAGNOSIS — Z9889 Other specified postprocedural states: Secondary | ICD-10-CM

## 2023-10-01 DIAGNOSIS — I480 Paroxysmal atrial fibrillation: Secondary | ICD-10-CM

## 2023-10-01 DIAGNOSIS — Q210 Ventricular septal defect: Secondary | ICD-10-CM

## 2023-10-01 NOTE — Telephone Encounter (Signed)
 Message -----  From: Wendall Stade, MD  Sent: 10/01/2023   2:48 PM EST  To: Arman Bogus, MD; *   Dr Yetta Barre follows her INR would hold coumadin for 4 days before EUS/GI endoscopy

## 2023-10-01 NOTE — Patient Instructions (Addendum)
 Medication Instructions:  Your physician recommends that you continue on your current medications as directed. Please refer to the Current Medication list given to you today.  *If you need a refill on your cardiac medications before your next appointment, please call your pharmacy*  Lab Work: If you have labs (blood work) drawn today and your tests are completely normal, you will receive your results only by: MyChart Message (if you have MyChart) OR A paper copy in the mail If you have any lab test that is abnormal or we need to change your treatment, we will call you to review the results.  Follow-Up: At [facility], you and your health needs are our priority.  As part of our continuing mission to provide you with exceptional heart care, we have created designated Provider Care Teams.  These Care Teams include your primary Cardiologist (physician) and Advanced Practice Providers (APPs -  Physician Assistants and Nurse Practitioners) who all work together to provide you with the care you need, when you need it.  We recommend signing up for the patient portal called "MyChart".  Sign up information is provided on this After Visit Summary.  MyChart is used to connect with patients for Virtual Visits (Telemedicine).  Patients are able to view lab/test results, encounter notes, upcoming appointments, etc.  Non-urgent messages can be sent to your provider as well.   To learn more about what you can do with MyChart, go to [email or web address].    Your next appointment:   1 year(s)  Provider:   Charlton Haws, MD     Other Instructions    1st Floor: - Lobby - Registration  - Pharmacy  - Lab - Cafe  2nd Floor: - PV Lab - Diagnostic Testing (echo, CT, nuclear med)  3rd Floor: - Vacant  4th Floor: - TCTS (cardiothoracic surgery) - AFib Clinic - Structural Heart Clinic - Vascular Surgery  - Vascular Ultrasound  5th Floor: - [facility] (general and EP) -  Clinical Pharmacy for coumadin, hypertension, lipid, weight-loss medications, and med management appointments    Valet parking services will be available as well.

## 2023-10-01 NOTE — Telephone Encounter (Signed)
 The patient son has been notified of this information and all questions answered. Coumadin will be held for 4 days prior

## 2023-10-06 ENCOUNTER — Ambulatory Visit: Payer: Medicare PPO | Admitting: Gastroenterology

## 2023-10-06 ENCOUNTER — Ambulatory Visit (INDEPENDENT_AMBULATORY_CARE_PROVIDER_SITE_OTHER): Payer: Medicare PPO

## 2023-10-06 DIAGNOSIS — I442 Atrioventricular block, complete: Secondary | ICD-10-CM | POA: Diagnosis not present

## 2023-10-07 ENCOUNTER — Ambulatory Visit: Payer: Medicare PPO

## 2023-10-07 VITALS — Ht 63.0 in | Wt 166.0 lb

## 2023-10-07 DIAGNOSIS — N184 Chronic kidney disease, stage 4 (severe): Secondary | ICD-10-CM | POA: Diagnosis not present

## 2023-10-07 DIAGNOSIS — Z0000 Encounter for general adult medical examination without abnormal findings: Secondary | ICD-10-CM

## 2023-10-07 NOTE — Patient Instructions (Addendum)
 Ms. Haroon , Thank you for taking time to come for your Medicare Wellness Visit. I appreciate your ongoing commitment to your health goals. Please review the following plan we discussed and let me know if I can assist you in the future.   Referrals/Orders/Follow-Ups/Clinician Recommendations: Aim for 30 minutes of exercise or brisk walking, 6-8 glasses of water, and 5 servings of fruits and vegetables each day. Appointment with PCP next week.  This is a list of the screening recommended for you and due dates:  Health Maintenance  Topic Date Due   Yearly kidney health urinalysis for diabetes  03/21/2023   COVID-19 Vaccine (10 - 2024-25 season) 06/26/2023   Hemoglobin A1C  02/22/2024   Eye exam for diabetics  05/20/2024   Complete foot exam   05/26/2024   Yearly kidney function blood test for diabetes  09/16/2024   Medicare Annual Wellness Visit  10/06/2024   Colon Cancer Screening  09/14/2025   Pneumonia Vaccine  Completed   Flu Shot  Completed   DEXA scan (bone density measurement)  Completed   Zoster (Shingles) Vaccine  Completed   HPV Vaccine  Aged Out   DTaP/Tdap/Td vaccine  Discontinued    Advanced directives: (In Chart) A copy of your advanced directives are scanned into your chart should your provider ever need it.  Next Medicare Annual Wellness Visit scheduled for next year: Yes - 2026

## 2023-10-07 NOTE — Progress Notes (Signed)
 Subjective:   Holly Simmons is a 83 y.o. who presents for a Medicare Wellness preventive visit.  Visit Complete: Virtual I connected with  Holly Simmons on 10/07/2023 by a audio enabled telemedicine application and verified that I am speaking with the correct person using two identifiers. Son, Holly Simmons, assisted with the visit by audio with the patient.  Patient Location: Home  Provider Location: Office/Clinic  I discussed the limitations of evaluation and management by telemedicine. The patient expressed understanding and agreed to proceed.  Vital Signs: Because this visit was a virtual/telehealth visit, some criteria may be missing or patient reported. Any vitals not documented were not able to be obtained and vitals that have been documented are patient reported.  VideoDeclined- This patient declined Librarian, academic. Therefore the visit was completed with audio only.  AWV Questionnaire: Yes: Patient Medicare AWV questionnaire was completed by the patient on 10/06/2023; I have confirmed that all information answered by patient is correct and no changes since this date. The patient's son, Holly Simmons, assisted with the questionnaire for today's visit by audio.  Cardiac Risk Factors include: advanced age (>74men, >60 women);diabetes mellitus;dyslipidemia     Objective:    Today's Vitals   10/07/23 1503  Weight: 166 lb (75.3 kg)  Height: 5\' 3"  (1.6 m)   Body mass index is 29.41 kg/m.     10/07/2023    3:02 PM 05/15/2023    6:01 PM 04/15/2023    2:42 PM 12/24/2022    1:39 PM 10/06/2022    9:53 AM 06/27/2022   11:00 PM 06/06/2022    3:15 PM  Advanced Directives  Does Patient Have a Medical Advance Directive? Yes Yes Yes Yes Yes No Yes  Type of Estate agent of [place];Living will Healthcare Power of [place];Living will Healthcare Power of [employer] Power of [employer] Power of  [place];Living will  Healthcare Power of [place];Living will  Does patient want to make changes to medical advance directive? No - Patient declined  No - Patient declined No - Patient declined     Copy of Healthcare Power of Attorney in Chart? Yes - validated most recent copy scanned in chart (See row information)  No - copy requested      Would patient like information on creating a medical advance directive?      No - Patient declined     Current Medications (verified) Outpatient Encounter Medications as of 10/07/2023  Medication Sig   albuterol (VENTOLIN HFA) 108 (90 Base) MCG/ACT inhaler Inhale 2 puffs into the lungs every 6 (six) hours as needed for wheezing or shortness of breath.   allopurinol (ZYLOPRIM) 100 MG tablet TAKE ONE TABLET BY MOUTH DAILY   Betamethasone Valerate 0.12 % foam Apply 1 application topically daily. (Patient taking differently: Apply 1 application  topically once a week.)   buPROPion (WELLBUTRIN XL) 300 MG 24 hr tablet Take 1 tablet (300 mg total) by mouth daily.   famotidine (PEPCID) 40 MG tablet TAKE ONE TABLET BY MOUTH DAILY   ferrous sulfate 325 (65 FE) MG tablet Take 1 tablet (325 mg total) by mouth 2 (two) times daily with a meal. (Patient taking differently: Take 325 mg by mouth daily with breakfast.)   hydrocortisone (ANUSOL-HC) 25 MG suppository Place 1 suppository (25 mg total) rectally 2 (two) times daily.   ipratropium (ATROVENT) 0.03 % nasal spray USE 2 SPRAYS IN EACH NOSTRIL 3 TIMES A DAY AS NEEDED. (Patient taking differently: Place  2 sprays into both nostrils daily as needed (nose bleed).)   ketoconazole (NIZORAL) 2 % cream Apply 1 Application topically 2 (two) times daily.   lamoTRIgine (LAMICTAL) 25 MG tablet Take 1 tablet (25 mg total) by mouth daily.   linaclotide (LINZESS) 72 MCG capsule TAKE 1 CAPSULE EVERY DAY BEFORE BREAKFAST   metoprolol tartrate (LOPRESSOR) 25 MG tablet TAKE ONE TABLET BY MOUTH TWICE DAILY   modafinil (PROVIGIL) 100 MG  tablet Take 1 tablet (100 mg total) by mouth daily. (Patient taking differently: Take 100 mg by mouth daily as needed (sleep disorder).)   Pitavastatin Calcium 2 MG TABS TAKE ONE TABLET BY MOUTH DAILY   PRESCRIPTION MEDICATION 1 Dose by Other route at bedtime as needed (sleep). CPAP   Propylene Glycol (SYSTANE BALANCE OP) Place 1 drop into both eyes 4 (four) times daily as needed (dry eyes).   REXULTI 0.25 MG TABS tablet TAKE 1 TABLET BY MOUTH DAILY   SYMBICORT 80-4.5 MCG/ACT inhaler USE 2 PUFFS TWICE A DAY. (Patient taking differently: 2 puffs 2 (two) times daily.)   torsemide (DEMADEX) 20 MG tablet Take 1 tablet (20 mg total) by mouth daily.   triamcinolone ointment (KENALOG) 0.1 % Apply 1 Application topically 2 (two) times daily. Use every day - bid   Vilazodone HCl (VIIBRYD) 40 MG TABS TAKE ONE TABLET BY MOUTH DAILY   warfarin (COUMADIN) 2.5 MG tablet TAKE 1 TABLET BY MOUTH DAILY EXCEPT TAKE 1/2 TABLET ON WEDNESDAYS OR AS DIRECTED BY ANTICOAGULATION CLINIC   [DISCONTINUED] cephALEXin (KEFLEX) 250 MG capsule Take 1 capsule (250 mg total) by mouth 3 (three) times daily.   [DISCONTINUED] guaiFENesin-dextromethorphan (ROBITUSSIN DM) 100-10 MG/5ML syrup Take 10 mLs by mouth every 6 (six) hours as needed for cough.   No facility-administered encounter medications on file as of 10/07/2023.    Allergies (verified) Petrolatum-zinc oxide; Sulfa antibiotics; Sulfonamide derivatives; Tetanus toxoid; Tetanus toxoids; Other; Nsaids; and Tetanus toxoid, adsorbed   History: Past Medical History:  Diagnosis Date   Allergic rhinitis    Asthma    NOS w/ acute exacerbation   Blood transfusion without reported diagnosis    CKD (chronic kidney disease) stage 3, GFR 30-59 ml/min (HCC) 09/12/2018   Colon polyps    TUBULAR ADENOMAS AND HYPERPLASTIC   Complete heart block (HCC)    requiring PPM (MDT) post surgical myomectomy at [facility],  leads are epicardial with abdominal implant, high ventricular threshold at  implant   COPD (chronic obstructive pulmonary disease) (HCC)    Depressive disorder    Diastolic dysfunction    DM (diabetes mellitus) (HCC)    Gallstones    GERD (gastroesophageal reflux disease)    Gout 08/20/2012   Heart murmur    Hyperpotassemia    Hypersomnia    Hypertension    Hypertrophic cardiomyopathy (HCC)    s/p surgical myomectomy at [facility] 11/12 complicated by septal VSD post procedure requiring reoperation with patch closure and tricuspid valve replacement   Kidney stones    Myocardial infarction (HCC) 2011   Obstructive sleep apnea    persistent daytime sleepiness despite cpap   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   Psoriasis    Pyuria    Renal insufficiency    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   Typical atrial flutter (HCC) 9/15   Past Surgical History:  Procedure Laterality Date   ABDOMINAL HYSTERECTOMY     APPENDECTOMY     BREAST CYST EXCISION     CARDIOVERSION N/A 08/01/2014   Procedure:  CARDIOVERSION;  Surgeon: Vesta Mixer, MD;  Location: [facility] ENDOSCOPY;  Service: Cardiovascular;  Laterality: N/A;   CESAREAN SECTION     CHOLECYSTECTOMY     COLONOSCOPY     EYE SURGERY  01/16/2016   left eye lens implant   EYE SURGERY  01/09/2016   right eye lens implant   INSERT / REPLACE / REMOVE PACEMAKER  07/13/2012   PACEMAKER INSERTION  07/02/2011   epicardial wires with abdominal implant at [facility] 12/12,  high ventricular lead threshold at implant per Dr Christin Fudge   PERMANENT PACEMAKER INSERTION N/A 07/13/2012   Procedure: PERMANENT PACEMAKER INSERTION;  Surgeon: Hillis Range, MD;  Location: [facility] CATH LAB;  Service: Cardiovascular;  Laterality: N/A;   POLYPECTOMY     septal myomectomy for hypertrophic CM  06/23/2011   by Dr Silvestre Mesi at [facility], complicated by septal VSD requiring patch repair and tricuspid valve replacement   TONSILLECTOMY     TOOTH EXTRACTION     TRICUSPID VALVE REPLACEMENT  06/2011   Medtronic 27mm Mosaic tissue valve   VSD REPAIR  06/2011    Family History  Problem Relation Age of Onset   Hypertension Daughter    Heart disease Maternal Grandfather    Heart disease Paternal Grandfather    Bladder Cancer Father    Prostate cancer Father    Cancer Father    Varicose Veins Father    Heart attack Father    Breast cancer Mother    Dementia Mother    Hypertension Mother    Cancer Mother    Ovarian cancer Maternal Aunt    Pancreatic cancer Cousin    Breast cancer Paternal Aunt    Dementia Maternal Aunt        x 2   Dementia Maternal Uncle        x 2   Colon cancer Neg Hx    Rectal cancer Neg Hx    Stomach cancer Neg Hx    Esophageal cancer Neg Hx    Social History   Socioeconomic History   Marital status: Widowed    Spouse name: Dimas Aguas   Number of children: 3   Years of education: Not on file   Highest education level: Bachelor's degree (e.g., BA, AB, BS)  Occupational History   Occupation: retired    Associate Professor: RETIRED  Tobacco Use   Smoking status: Never   Smokeless tobacco: Never  Vaping Use   Vaping status: Never Used  Substance and Sexual Activity   Alcohol use: No    Alcohol/week: 0.0 standard drinks of alcohol   Drug use: No   Sexual activity: Not Currently  Other Topics Concern   Not on file  Social History Narrative   ** Merged History Encounter **    Are you right handed or left handed? left   Are you currently employed ?    What is your current occupation? retired   Do you live at home alone? With daughter   Who lives with you?    What type of home do you live in: 1 story or 2 story? two    Caffeine rarely   Social Drivers of Corporate investment banker Strain: Low Risk  (10/07/2023)   Overall Financial Resource Strain (CARDIA)    Difficulty of Paying Living Expenses: Not hard at all  Food Insecurity: No Food Insecurity (10/07/2023)   Hunger Vital Sign    Worried About Running Out of Food in the Last Year: Never true    Ran Out  of Food in the Last Year: Never true  Transportation  Needs: No Transportation Needs (10/07/2023)   PRAPARE - Administrator, Civil Service (Medical): No    Lack of Transportation (Non-Medical): No  Physical Activity: Inactive (10/07/2023)   Exercise Vital Sign    Days of Exercise per Week: 0 days    Minutes of Exercise per Session: 0 min  Stress: Stress Concern Present (10/07/2023)   [employer] of Occupational Health - Occupational Stress Questionnaire    Feeling of Stress : Very much  Social Connections: Unknown (10/07/2023)   Social Connection and Isolation Panel [NHANES]    Frequency of Communication with Friends and Family: Patient unable to answer    Frequency of Social Gatherings with Friends and Family: Once a week    Attends Religious Services: Never    Database administrator or Organizations: No    Attends Banker Meetings: Never    Marital Status: Widowed    Tobacco Counseling - Non Smoker Counseling given - N/A   Clinical Intake:  Pre-visit preparation completed: Yes  Pain : No/denies pain     BMI - recorded: 29.41 Nutritional Status: BMI 25 -29 Overweight Nutritional Risks: None Diabetes: No  How often do you need to have someone help you when you read instructions, pamphlets, or other written materials from your doctor or pharmacy?: 5 - Always (son assists)  Interpreter Needed?: No  Comments: Son assist with answering questions Information entered by :: Hassell Halim, CMA   Activities of Daily Living     10/07/2023    3:56 PM 10/06/2023    6:02 PM  In your present state of health, do you have any difficulty performing the following activities:  Hearing? 0 0  Comment wears hearing aids wears hearing aids  Vision? 0 0  Difficulty concentrating or making decisions? 1 1  Walking or climbing stairs? 1 1  Comment son/dtr assist   Dressing or bathing? 1 1  Comment dtr assist   Doing errands, shopping? 1 1  Preparing Food and eating ? Y Y  Comment son/dtr assist   Using the Toilet?  Y   Comment son/dtr assist   In the past six months, have you accidently leaked urine? Malvin Johns  Comment wears depends daily wears depends  Do you have problems with loss of bowel control? N N  Managing your Medications? Y Y  Comment son/dtr assists   Managing your Finances? Malvin Johns  Comment son assist   Housekeeping or managing your Housekeeping? Malvin Johns  Comment son/dtr assist     Patient Care Team: Etta Grandchild, MD as PCP - General (Internal Medicine) Wendall Stade, MD as PCP - Cardiology (Cardiology) Etta Grandchild, MD Sinda Du, MD as Consulting Physician (Ophthalmology)  Indicate any recent Medical Services you may have received from other than [facility] providers in the past year (date may be approximate).     Assessment:   This is a routine wellness examination for [place].  Hearing/Vision screen Hearing Screening - Comments:: Wears hearing aids Vision Screening - Comments:: Wears rx glasses - up to date with routine eye exams with Dr Cathey Endow   Goals Addressed               This Visit's Progress     Patient Stated (pt-stated)        Patient and son plan to monitor her mental status each day and try to manage her medications daily as has.  Depression Screen     10/07/2023    3:22 PM 10/07/2023    3:18 PM 05/27/2023    9:00 AM 03/03/2023    1:04 PM 02/10/2023    2:32 PM 10/06/2022    4:20 PM 07/01/2022    2:30 PM  PHQ 2/9 Scores  PHQ - 2 Score 4 4 1   3 3   PHQ- 9 Score 13 12 12   7 8   Exception Documentation Medical reason Medical reason  Medical reason Medical reason      Fall Risk     10/07/2023    3:54 PM 10/06/2023    6:02 PM 04/15/2023    2:42 PM 10/06/2022    4:24 PM 10/06/2022    9:53 AM  Fall Risk   Falls in the past year? 1 1 1  0 0  Number falls in past yr: 0 0 1 0 0  Injury with Fall? 0 0 1 0 0  Risk for fall due to : Impaired balance/gait Impaired balance/gait;Mental status change;History of fall(s)  Impaired balance/gait   Risk for fall due to:  Comment  dx-dementia     Follow up Falls evaluation completed;Falls prevention discussed Falls evaluation completed;Falls prevention discussed Falls evaluation completed Falls prevention discussed;Falls evaluation completed Falls evaluation completed    MEDICARE RISK AT HOME:  Medicare Risk at Home Any stairs in or around the home?: Yes If so, are there any without handrails?: No Home free of loose throw rugs in walkways, pet beds, electrical cords, etc?: Yes Adequate lighting in your home to reduce risk of falls?: Yes Life alert?: No Use of a cane, walker or w/c?: Yes (walker) Grab bars in the bathroom?: Yes Shower chair or bench in shower?: Yes Elevated toilet seat or a handicapped toilet?: No  TIMED UP AND GO:  Was the test performed?  No  Cognitive Function: 6CIT completed    10/06/2022    4:35 PM 12/27/2014    5:55 PM  MMSE - Mini Mental State Exam  Not completed: Unable to complete --      10/06/2022    6:00 PM  Montreal Cognitive Assessment   Visuospatial/ Executive (0/5) 2  Naming (0/3) 2  Attention: Read list of digits (0/2) 0  Attention: Read list of letters (0/1) 0  Attention: Serial 7 subtraction starting at 100 (0/3) 1  Language: Repeat phrase (0/2) 0  Language : Fluency (0/1) 0  Abstraction (0/2) 0  Delayed Recall (0/5) 0  Orientation (0/6) 1  Total 6  Adjusted Score (based on education) 7      10/07/2023    3:11 PM 10/06/2022    4:34 PM 03/07/2020    2:31 PM  6CIT Screen  What Year? 0 points 0 points 0 points  What month? 0 points 0 points 0 points  What time? 0 points 0 points 3 points  Count back from 20 4 points 0 points 0 points  Months in reverse 4 points 0 points 2 points  Repeat phrase 8 points 0 points 10 points  Total Score 16 points 0 points 15 points    Immunizations Immunization History  Administered Date(s) Administered   Fluad Quad(high Dose 65+) 04/04/2019, 06/06/2021   Influenza Split 05/14/2011, 05/12/2012   Influenza Whole  05/02/2009, 04/24/2010   Influenza, High Dose Seasonal PF 04/15/2016, 05/06/2017, 04/20/2018   Influenza,inj,Quad PF,6+ Mos 04/13/2013, 04/19/2014, 05/02/2015   Influenza-Unspecified 04/25/2020, 05/05/2022, 05/01/2023   Moderna Covid-19 Vaccine Bivalent Booster 23yrs & up 06/25/2021, 05/05/2022   Moderna Sars-Covid-2  Vaccination 08/07/2019, 09/14/2019, 05/30/2020, 01/15/2021, 02/07/2022   Pfizer(Comirnaty)Fall Seasonal Vaccine 12 years and older 10/13/2022, 05/01/2023   Pneumococcal Conjugate-13 01/18/2014   Pneumococcal Polysaccharide-23 08/10/2008, 05/30/2015, 06/06/2021   Rotavirus,unspecified  10/13/2022   Varicella 03/11/2006   Zoster Recombinant(Shingrix) 12/29/2017, 04/02/2018   Zoster, Live 08/04/2014    Screening Tests Health Maintenance  Topic Date Due   Diabetic kidney evaluation - Urine ACR  03/21/2023   COVID-19 Vaccine (10 - 2024-25 season) 06/26/2023   HEMOGLOBIN A1C  02/22/2024   OPHTHALMOLOGY EXAM  05/20/2024   FOOT EXAM  05/26/2024   Diabetic kidney evaluation - eGFR measurement  09/16/2024   Medicare Annual Wellness (AWV)  10/06/2024   Colonoscopy  09/14/2025   Pneumonia Vaccine 61+ Years old  Completed   INFLUENZA VACCINE  Completed   DEXA SCAN  Completed   Zoster Vaccines- Shingrix  Completed   HPV VACCINES  Aged Out   DTaP/Tdap/Td  Discontinued    Health Maintenance  Health Maintenance Due  Topic Date Due   Diabetic kidney evaluation - Urine ACR  03/21/2023   COVID-19 Vaccine (10 - 2024-25 season) 06/26/2023   Health Maintenance Items Addressed:10/07/2023    Additional Screening:  Vision Screening: Recommended annual ophthalmology exams for early detection of glaucoma and other disorders of the eye. Son stated the patient sees Dr Cathey Endow annually for eye exams.  Dental Screening: Recommended annual dental exams for proper oral hygiene  Community Resource Referral / Chronic Care Management: CRR required this visit?  No   CCM required this visit?   No     Plan:     I have personally reviewed and noted the following in the patient's chart:   Medical and social history Use of alcohol, tobacco or illicit drugs  Current medications and supplements including opioid prescriptions. Patient is not currently taking opioid prescriptions. Functional ability and status Nutritional status Physical activity Advanced directives List of other physicians Hospitalizations, surgeries, and ER visits in previous 12 months Vitals Screenings to include cognitive, depression, and falls Referrals and appointments  In addition, I have reviewed and discussed with patient certain preventive protocols, quality metrics, and best practice recommendations. A written personalized care plan for preventive services as well as general preventive health recommendations were provided to patient.     Darreld Mclean, CMA   10/07/2023   After Visit Summary: (MyChart) Due to this being a telephonic visit, the after visit summary with patients personalized plan was offered to patient via MyChart   Notes:  Patient and son has an appt w/PCP this coming week (10/13/23) to access mental status and exam.

## 2023-10-08 ENCOUNTER — Encounter: Payer: Self-pay | Admitting: Internal Medicine

## 2023-10-08 LAB — CUP PACEART REMOTE DEVICE CHECK
Battery Impedance: 2336 Ohm
Battery Remaining Longevity: 28 mo
Battery Voltage: 2.72 V
Brady Statistic AP VP Percent: 100 %
Brady Statistic AP VS Percent: 0 %
Brady Statistic AS VP Percent: 0 %
Brady Statistic AS VS Percent: 0 %
Date Time Interrogation Session: 20250304085603
Implantable Lead Connection Status: 753985
Implantable Lead Connection Status: 753985
Implantable Lead Implant Date: 20131210
Implantable Lead Implant Date: 20131210
Implantable Lead Location: 753858
Implantable Lead Location: 753859
Implantable Lead Model: 4296
Implantable Lead Model: 5076
Implantable Pulse Generator Implant Date: 20131210
Lead Channel Impedance Value: 488 Ohm
Lead Channel Impedance Value: 974 Ohm
Lead Channel Pacing Threshold Amplitude: 0.75 V
Lead Channel Pacing Threshold Amplitude: 0.875 V
Lead Channel Pacing Threshold Pulse Width: 0.4 ms
Lead Channel Pacing Threshold Pulse Width: 0.4 ms
Lead Channel Setting Pacing Amplitude: 2 V
Lead Channel Setting Pacing Amplitude: 2.5 V
Lead Channel Setting Pacing Pulse Width: 0.4 ms
Lead Channel Setting Sensing Sensitivity: 8 mV
Zone Setting Status: 755011
Zone Setting Status: 755011

## 2023-10-09 NOTE — Telephone Encounter (Signed)
 Pt scheduled for upper endoscopic [place] on 3/26.  Recommend lovnenox bridge.  Actual wt; 75.3 Ideal wt; 52 kg (actual is 144% > than ideal) Adjusted wt; 62 kg  CrCl: 18.38 mL/min using adjusted wt  Recommend 80 mg Lovenox daily in PM due to kidney function.  Current warfarin dosing; 1 tablet (2.5 mg) daily except take 1/2 tablet (1.25 mg) on Tuesday and Saturday  Lovenox bridge;  3/21: Take last dose of warfarin 3/22: NO warfarin, NO Lovenox 3/23: NO warfarin, inject Lovenox once in the PM 3/24: NO warfarin, inject Lovenox once in the PM 3/25: NO warfarin, NO Lovenox  3/26: SURGERY; NO WARFARIN, NO LOVENOX  3/27: Take 1 1/2 tablets (3.75 mg) warfarin, inject Lovenox once in the PM 3/28: Take 1 1/2 tablets (3.75 mg) warfarin, inject Lovenox once in the PM 3/29: Take 1 tablet (2.5 mg) warfarin, inject Lovenox once in the PM 3/30: Take 1 tablet (2.5 mg) warfarin, inject Lovenox once in the PM 3/31: Take 1 tablet (2.5 mg) warfarin, inject Lovenox once in the PM 4/1: Recheck INR; NO WARFARIN AND NO LOVENOX UNTIL AFTER INR CHECK

## 2023-10-12 ENCOUNTER — Other Ambulatory Visit (HOSPITAL_COMMUNITY): Payer: Self-pay

## 2023-10-12 DIAGNOSIS — H5340 Unspecified visual field defects: Secondary | ICD-10-CM | POA: Diagnosis not present

## 2023-10-13 ENCOUNTER — Encounter: Payer: Self-pay | Admitting: Physician Assistant

## 2023-10-13 ENCOUNTER — Ambulatory Visit: Payer: Medicare PPO | Admitting: Internal Medicine

## 2023-10-13 ENCOUNTER — Ambulatory Visit: Payer: Medicare PPO | Admitting: Physician Assistant

## 2023-10-13 ENCOUNTER — Ambulatory Visit: Payer: Medicare PPO

## 2023-10-13 ENCOUNTER — Encounter: Payer: Self-pay | Admitting: Internal Medicine

## 2023-10-13 VITALS — BP 119/57 | HR 78 | Resp 18 | Wt 168.0 lb

## 2023-10-13 VITALS — BP 120/56 | HR 71 | Temp 98.2°F | Resp 16 | Ht 63.0 in | Wt 167.8 lb

## 2023-10-13 DIAGNOSIS — R270 Ataxia, unspecified: Secondary | ICD-10-CM | POA: Insufficient documentation

## 2023-10-13 DIAGNOSIS — F03918 Unspecified dementia, unspecified severity, with other behavioral disturbance: Secondary | ICD-10-CM | POA: Diagnosis not present

## 2023-10-13 DIAGNOSIS — F0392 Unspecified dementia, unspecified severity, with psychotic disturbance: Secondary | ICD-10-CM | POA: Diagnosis not present

## 2023-10-13 DIAGNOSIS — R404 Transient alteration of awareness: Secondary | ICD-10-CM

## 2023-10-13 DIAGNOSIS — S0990XA Unspecified injury of head, initial encounter: Secondary | ICD-10-CM | POA: Diagnosis not present

## 2023-10-13 DIAGNOSIS — Z7901 Long term (current) use of anticoagulants: Secondary | ICD-10-CM

## 2023-10-13 LAB — POCT INR: INR: 3.8 — AB (ref 2.0–3.0)

## 2023-10-13 MED ORDER — ENOXAPARIN SODIUM 80 MG/0.8ML IJ SOSY: 80.0000 mg | PREFILLED_SYRINGE | INTRAMUSCULAR | 0 refills | Status: DC

## 2023-10-13 NOTE — Progress Notes (Signed)
 Pt scheduled for upper endoscopic ultrasound for 3/26 and will be placed on a lovenox bridge. Pt also has a PCP apt today. Per PCP, agree with Lovenox bridge schedule. Hold dose today and then change weekly dose to take 1 tablet daily except take 1/2 tablet on Monday, Wednesday and Friday until starting instructions below for the Lovenox bridge 3/21: Take last dose of warfarin 3/22: NO warfarin, NO Lovenox 3/23: NO warfarin, inject Lovenox once in the PM 3/24: NO warfarin, inject Lovenox once in the PM 3/25: NO warfarin, NO Lovenox   3/26: SURGERY; NO WARFARIN, NO LOVENOX   3/27: Take 1 1/2 tablets (3.75 mg) warfarin, inject Lovenox once in the PM 3/28: Take 1 tablet (2.5 mg) warfarin, inject Lovenox once in the PM 3/29: Take 1 1/2 tablets (3.75 mg) warfarin, inject Lovenox once in the PM 3/30: Take 1 tablet (2.5 mg) warfarin, inject Lovenox once in the PM 3/31: Take 1/2 tablet (1.25 mg) warfarin, inject Lovenox once in the PM 4/1: Recheck INR; NO WARFARIN AND NO LOVENOX UNTIL AFTER INR CHECK  Sent in Lovenox prescription.

## 2023-10-13 NOTE — Telephone Encounter (Signed)
 Noted.

## 2023-10-13 NOTE — Progress Notes (Signed)
 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 RH female with a history of hypertension, hyperlipidemia, multiple cardiology issues including pacemaker placement, TV replacement on Coumadin, thrombocytopenia, aortic stenosis, CKD, recent lower extremity cellulitis, insomnia and a history of dementia likely due to Alzheimer's disease with behavioral disturbance seen today in follow up for memory loss. Patient is currently on Rexulti for agitation as per PCP. She is not on antidementia medication given advanced disease. Cognitive decline is noted complicated with episodes of intermittent confusion. It is unclear if she had any transient alteration of awareness episodes, although she describes it as dizziness. Her PCP is concerned about a stroke, although she is on Coumadin, and she carries a history of thrombocytopenia, which makes it less likely. Will follow the image. On exam, no signs  of stroke are demonstrated. She needs more assistance I with ADLS.     Follow up pending the results of the EEG or if new stroke has ben found on imaging Continue 24/7 monitoring for safety Due to advanced disease, no antidementia medication is indicated Recommend good control of her cardiovascular risk factors, continue Coumadin per cardiology Continue to control mood as per PCP Agree with CT head to rule out SDH after trauma to the head, mass (h/o pancreatic mass), vascular load.     Subjective:    This patient is accompanied in the office by her son and her daughter who supplements the history.  Previous records as well as any outside records available were reviewed prior to todays visit. Patient was last seen on 04/15/2023, last MoCA on 10/06/2022 was 7   Any changes in memory since last visit? " Her short-term memory may be worse".  Long-term memory may be affected although she still able to  recognize some family members.  She enjoys watchingTV Repeats oneself?  Endorsed, especially with appointments Disoriented when walking into a room? Mixes rooms at home. The other day she got confused and thought the floor was the toilet seat and sat on the floor. Leaving objects?  May misplace things and then may accuse her daughter of hiding them.  "If not in the regular spot this is lost ".  Wandering behavior?  denies   Any personality changes since last visit?  denies.   Any worsening depression?:  Denies.   Hallucinations or paranoia?  Denies.   Seizures?  She has moments of TAA, but no frank seizure activity. No tongue biting, muscle tightness, metallic taste, blood taste, urine incontinence during these episodes, muscle tightness, or body jerk  Any sleep changes?  "He sleeps a lot more than before "-family says.  Denies vivid dreams, REM behavior or sleepwalking   Sleep apnea?   Denies.   Any hygiene concerns? Denies.  Independent of bathing and dressing?  Endorsed  Does the patient needs help with medications?  Son is in charge  Who is in charge of the finances?  Son is in charge    Any changes in appetite?  Denies. Drinks pretty water     Patient have trouble swallowing? Denies.   Does the patient cook?  Yes, but only with supervision Any headaches?   denies   Chronic back pain  denies   Ambulates with difficulty? Denies.  Uses a walker for stability Recent falls or head injuries?  On 09/27/2023, the patient sustained a fall appearing to be due to increasing deconditioning. She hit  the occipital area. She recently had an unremarkable eye exam. She denies any blindness. As she is on Coumadin, her PCP ordered a CT head to be performed this week.     unilateral weakness, numbness or tingling? denies.  Son reports that recently, she was "dizzy for a few days, upon getting up  went back to bed, improved, on was more awake, trying to hand her "imaginary cheeseburger.  "To her son she slept  heavily the next day, slower to returned when compared to October. Not back to what it was 2 weeks ago. Any tremors?  Denies   Any anosmia?  Denies   Any incontinence of urine?  Endorsed, wears diapers.  Any bowel dysfunction?  Intermittent diarrhea      Patient lives with her daughter Does the patient drive? No longer drives    Initial evaluation March 2024 How long did patient have memory difficulties?  For at least 6 years, worse over the last 6 months "where he took a drastic turn ".  She has difficulty recalling conversations, names.  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    repeats oneself?  Endorsed, at times asking several times "where is Perlie Gold?  (Son) ".  Sometimes she mixes her husband with her son. Disoriented when walking into a room?  Patient denies, however sometimes she may think she is at her home when she is at her son's house   Wandering behavior? denies   Any personality changes since last visit?  "Much more agitated and aggressive over the last 6 months, she was placed on Rexulti ".   Any worsening depression?:"possibly" Hallucinations or paranoia?  denies   Seizures?   denies    Any sleep changes?  " I fall asleep in the chair ", sleeps well at night.  Denies vivid dreams, REM behavior or sleepwalking   Sleep apnea? Endorsed, does not like to use the CPAP  Any hygiene concerns?  denies   Independent of bathing and dressing?  Endorsed  Does the patient need help with medications?  Son and daughter are in charge.  Her son keeps the drug books and her daughter gets the tray and administers her meds . Who is in charge of the finances? Son in charge    Any changes in appetite?   denies .  They push water. Patient have trouble swallowing?  Endorsed, she chokes frequently over the last few months, take small bites.  She had an endoscopy recently, which was negative  Does the patient cook?  No Any headaches?  denies   Chronic back pain  denies    Ambulates with difficulty?  Some days she is more "wobbly "she uses the walker Recent falls or head injuries?  Denies, close calls      Unilateral weakness, numbness or tingling?  denies   Any tremors?  denies   Any anosmia?  denies   Any incontinence of urine? "Sometimes she has accidents, she does not know its too late and has an accident , wears depends " Any bowel dysfunction?  Occasional constipation  Patient lives   with daughter  History of heavy alcohol intake? denies   History of heavy tobacco use?   denies   Family history of dementia?   Endorsed Dose patient drive?  Not for the last  2 years PREVIOUS MEDICATIONS:   CURRENT MEDICATIONS:  Outpatient Encounter Medications as of 10/13/2023  Medication Sig   albuterol (VENTOLIN HFA) 108 (90 Base) MCG/ACT  inhaler Inhale 2 puffs into the lungs every 6 (six) hours as needed for wheezing or shortness of breath.   allopurinol (ZYLOPRIM) 100 MG tablet TAKE ONE TABLET BY MOUTH DAILY   buPROPion (WELLBUTRIN XL) 300 MG 24 hr tablet Take 1 tablet (300 mg total) by mouth daily.   famotidine (PEPCID) 40 MG tablet TAKE ONE TABLET BY MOUTH DAILY   ferrous sulfate 325 (65 FE) MG tablet Take 1 tablet (325 mg total) by mouth 2 (two) times daily with a meal. (Patient taking differently: Take 325 mg by mouth daily with breakfast.)   hydrocortisone (ANUSOL-HC) 25 MG suppository Place 1 suppository (25 mg total) rectally 2 (two) times daily.   ipratropium (ATROVENT) 0.03 % nasal spray USE 2 SPRAYS IN EACH NOSTRIL 3 TIMES A DAY AS NEEDED. (Patient taking differently: Place 2 sprays into both nostrils daily as needed (nose bleed).)   lamoTRIgine (LAMICTAL) 25 MG tablet Take 1 tablet (25 mg total) by mouth daily.   linaclotide (LINZESS) 72 MCG capsule TAKE 1 CAPSULE EVERY DAY BEFORE BREAKFAST   metoprolol tartrate (LOPRESSOR) 25 MG tablet TAKE ONE TABLET BY MOUTH TWICE DAILY   modafinil (PROVIGIL) 100 MG tablet Take 1 tablet (100 mg total) by mouth daily.  (Patient taking differently: Take 100 mg by mouth daily as needed (sleep disorder).)   Pitavastatin Calcium 2 MG TABS TAKE ONE TABLET BY MOUTH DAILY   PRESCRIPTION MEDICATION 1 Dose by Other route at bedtime as needed (sleep). CPAP   Propylene Glycol (SYSTANE BALANCE OP) Place 1 drop into both eyes 4 (four) times daily as needed (dry eyes).   REXULTI 0.25 MG TABS tablet TAKE 1 TABLET BY MOUTH DAILY   torsemide (DEMADEX) 20 MG tablet Take 1 tablet (20 mg total) by mouth daily.   triamcinolone ointment (KENALOG) 0.1 % Apply 1 Application topically 2 (two) times daily. Use every day - bid   Vilazodone HCl (VIIBRYD) 40 MG TABS TAKE ONE TABLET BY MOUTH DAILY   warfarin (COUMADIN) 2.5 MG tablet TAKE 1 TABLET BY MOUTH DAILY EXCEPT TAKE 1/2 TABLET ON WEDNESDAYS OR AS DIRECTED BY ANTICOAGULATION CLINIC   Betamethasone Valerate 0.12 % foam Apply 1 application topically daily. (Patient taking differently: Apply 1 application  topically once a week.)   enoxaparin (LOVENOX) 80 MG/0.8ML injection Inject 0.8 mLs (80 mg total) into the skin daily. USE AS DIRECTED BY ANTICOAGULATION CLINIC (Patient not taking: Reported on 10/13/2023)   ketoconazole (NIZORAL) 2 % cream Apply 1 Application topically 2 (two) times daily.   SYMBICORT 80-4.5 MCG/ACT inhaler USE 2 PUFFS TWICE A DAY. (Patient taking differently: 2 puffs 2 (two) times daily.)   No facility-administered encounter medications on file as of 10/13/2023.       10/06/2022    4:35 PM 12/27/2014    5:55 PM  MMSE - Mini Mental State Exam  Not completed: Unable to complete --      10/06/2022    6:00 PM  Montreal Cognitive Assessment   Visuospatial/ Executive (0/5) 2  Naming (0/3) 2  Attention: Read list of digits (0/2) 0  Attention: Read list of letters (0/1) 0  Attention: Serial 7 subtraction starting at 100 (0/3) 1  Language: Repeat phrase (0/2) 0  Language : Fluency (0/1) 0  Abstraction (0/2) 0  Delayed Recall (0/5) 0  Orientation (0/6) 1  Total 6   Adjusted Score (based on education) 7    Objective:     PHYSICAL EXAMINATION:    VITALS:   Vitals:  10/13/23 1503  BP: (!) 119/57  Pulse: 78  Resp: 18  SpO2: 98%  Weight: 168 lb (76.2 kg)    GEN:  The patient appears stated age and is in NAD. HEENT:  Normocephalic, bruise in occipital area  Neurological examination:  General: NAD, well-groomed, appears stated age. Orientation: The patient is awake but not very attentive.  Oriented to person, not to place and date Cranial nerves: There is good facial symmetry.The speech is fluent and clear. No aphasia or dysarthria. Fund of knowledge is reduced. Recent and remote memory are impaired. Attention and concentration are reduced.  Unable  to name objects and unable to repeat phrases.  Hearing is intact to conversational tone.   Sensation: Sensation is intact to light touch throughout Motor: Strength is at least antigravity x4. DTR's 1/4 in UE/LE     Movement examination: Tone: There is normal tone in the UE/LE Abnormal movements:  no tremor.  No myoclonus. Possible LUEasterixis.   Coordination:  There is no decremation with RAM's. Normal finger to nose  Gait and Station: The patient has difficulty arising out of a deep-seated chair without the use of the hands. Needs a walker to ambulate. The patient's stride length is good.  Gait is cautious and narrow.    Thank you for allowing [place] the opportunity to participate in the care of this nice patient. Please do not hesitate to contact us for any questions or concerns.   Total time spent on today's visit was 30 minutes dedicated to this patient today, preparing to see patient, examining the patient, ordering tests and/or medications and counseling the patient, documenting clinical information in the EHR or other health record, independently interpreting results and communicating results to the patient/family, discussing treatment and goals, answering patient's questions and coordinating  care.  Cc:  Etta Grandchild, MD  Marlowe Kays 10/13/2023 5:23 PM

## 2023-10-13 NOTE — Telephone Encounter (Signed)
 Per PCP, agree with placing pt on Lovenox bridge. Pt and family are in office today for PCP apt and coumadin clinic apt. Family has been educated on Lovenox bridge instructions. Both pt's son and daughter verbalized understanding and have the direct phone number to the coumadin clinic nurse.

## 2023-10-13 NOTE — Patient Instructions (Signed)
 One hour EEG, follow up PRN

## 2023-10-13 NOTE — Progress Notes (Signed)
 Subjective:  Patient ID: Holly Simmons, female    DOB: 01/18/1941  Age: 83 y.o. MRN: [id number]  CC: Head Injury   HPI [name] presents for f/up ---  Discussed the use of AI scribe software for clinical note transcription with the patient, who gave verbal consent to proceed.  History of Present Illness   The patient is an 83 year old with a history of stroke who presents with new neurological symptoms and concerns of another stroke. She is accompanied by her children.  Over the past two weeks, she has experienced new neurological symptoms, including an episode of dizziness that lasted throughout the day, followed by a brief loss of consciousness, confusion, and hallucinations. Since then, dizziness has not recurred, but there has been a persistent change in her mental status, characterized by confusion and hallucinations. No vomiting or slurred speech has been noted.  Her physical strength has been inconsistent, with significant weakness in her legs on some days, impacting her ability to walk and resulting in falls. One fall led to a minor head injury without significant bruising or lumps. She is on Coumadin, raising concerns about bleeding risks.  A recent ophthalmology appointment showed no responses on a field of vision test, although she has not been bumping into objects at home. She acknowledges some trouble with her vision but denies significant vision loss.  She has experienced epistaxis over the past four to five days, which have been minor and resolved quickly. This is a new symptom for her.  She has an upcoming endoscopic ultrasound scheduled to evaluate for pancreatic cancer, as previous blood tests have been inconclusive.       Outpatient Medications Prior to Visit  Medication Sig Dispense Refill   albuterol (VENTOLIN HFA) 108 (90 Base) MCG/ACT inhaler Inhale 2 puffs into the lungs every 6 (six) hours as needed for wheezing or shortness of breath. 18 g 0    allopurinol (ZYLOPRIM) 100 MG tablet TAKE ONE TABLET BY MOUTH DAILY 90 tablet 1   Betamethasone Valerate 0.12 % foam Apply 1 application topically daily. (Patient taking differently: Apply 1 application  topically once a week.) 100 g 0   buPROPion (WELLBUTRIN XL) 300 MG 24 hr tablet Take 1 tablet (300 mg total) by mouth daily. 90 tablet 0   famotidine (PEPCID) 40 MG tablet TAKE ONE TABLET BY MOUTH DAILY 90 tablet 1   ferrous sulfate 325 (65 FE) MG tablet Take 1 tablet (325 mg total) by mouth 2 (two) times daily with a meal. (Patient taking differently: Take 325 mg by mouth daily with breakfast.) 180 tablet 1   hydrocortisone (ANUSOL-HC) 25 MG suppository Place 1 suppository (25 mg total) rectally 2 (two) times daily. 12 suppository 1   ipratropium (ATROVENT) 0.03 % nasal spray USE 2 SPRAYS IN EACH NOSTRIL 3 TIMES A DAY AS NEEDED. (Patient taking differently: Place 2 sprays into both nostrils daily as needed (nose bleed).) 30 mL 0   ketoconazole (NIZORAL) 2 % cream Apply 1 Application topically 2 (two) times daily. 60 g 2   lamoTRIgine (LAMICTAL) 25 MG tablet Take 1 tablet (25 mg total) by mouth daily. 90 tablet 0   linaclotide (LINZESS) 72 MCG capsule TAKE 1 CAPSULE EVERY DAY BEFORE BREAKFAST 90 capsule 0   metoprolol tartrate (LOPRESSOR) 25 MG tablet TAKE ONE TABLET BY MOUTH TWICE DAILY 180 tablet 1   modafinil (PROVIGIL) 100 MG tablet Take 1 tablet (100 mg total) by mouth daily. (Patient taking differently: Take 100 mg by mouth  daily as needed (sleep disorder).) 30 tablet 5   Pitavastatin Calcium 2 MG TABS TAKE ONE TABLET BY MOUTH DAILY 90 tablet 1   PRESCRIPTION MEDICATION 1 Dose by Other route at bedtime as needed (sleep). CPAP     Propylene Glycol (SYSTANE BALANCE OP) Place 1 drop into both eyes 4 (four) times daily as needed (dry eyes).     REXULTI 0.25 MG TABS tablet TAKE 1 TABLET BY MOUTH DAILY 90 tablet 0   SYMBICORT 80-4.5 MCG/ACT inhaler USE 2 PUFFS TWICE A DAY. (Patient taking  differently: 2 puffs 2 (two) times daily.) 30.6 g 5   torsemide (DEMADEX) 20 MG tablet Take 1 tablet (20 mg total) by mouth daily. 90 tablet 1   triamcinolone ointment (KENALOG) 0.1 % Apply 1 Application topically 2 (two) times daily. Use every day - bid 80 g 1   Vilazodone HCl (VIIBRYD) 40 MG TABS TAKE ONE TABLET BY MOUTH DAILY 90 tablet 1   warfarin (COUMADIN) 2.5 MG tablet TAKE 1 TABLET BY MOUTH DAILY EXCEPT TAKE 1/2 TABLET ON WEDNESDAYS OR AS DIRECTED BY ANTICOAGULATION CLINIC 180 tablet 0   No facility-administered medications prior to visit.    ROS Review of Systems  Constitutional: Negative.  Negative for appetite change.  HENT:  Positive for nosebleeds. Negative for facial swelling and sinus pressure.   Respiratory:  Negative for cough, chest tightness, shortness of breath and wheezing.   Cardiovascular:  Negative for chest pain, palpitations and leg swelling.  Gastrointestinal: Negative.  Negative for abdominal pain, constipation and diarrhea.  Genitourinary: Negative.  Negative for difficulty urinating.  Musculoskeletal: Negative.   Skin: Negative.   Neurological:  Positive for dizziness, weakness and light-headedness. Negative for headaches.  Hematological:  Negative for adenopathy. Bruises/bleeds easily.  Psychiatric/Behavioral:  Positive for behavioral problems, decreased concentration, hallucinations and sleep disturbance. Negative for dysphoric mood and suicidal ideas. The patient is not nervous/anxious and is not hyperactive.     Objective:  BP (!) 120/56 (BP Location: Right Arm, Patient Position: Sitting, Cuff Size: Normal)   Pulse 71   Temp 98.2 F (36.8 C) (Oral)   Ht 5\' 3"  (1.6 m)   Wt 167 lb 12.8 oz (76.1 kg)   SpO2 96%   BMI 29.72 kg/m   BP Readings from Last 3 Encounters:  10/13/23 (!) 120/56  10/01/23 100/68  09/17/23 118/62    Wt Readings from Last 3 Encounters:  10/13/23 167 lb 12.8 oz (76.1 kg)  10/07/23 166 lb (75.3 kg)  10/01/23 166 lb 12.8 oz  (75.7 kg)    Physical Exam Vitals reviewed.  Constitutional:      Appearance: She is ill-appearing.  HENT:     Mouth/Throat:     Mouth: Mucous membranes are moist.  Eyes:     General: No scleral icterus.    Conjunctiva/sclera: Conjunctivae normal.  Cardiovascular:     Rate and Rhythm: Normal rate and regular rhythm.     Heart sounds: Murmur heard.     No friction rub. No gallop.  Pulmonary:     Effort: Pulmonary effort is normal.     Breath sounds: No stridor. No wheezing, rhonchi or rales.  Abdominal:     General: Abdomen is flat.     Palpations: There is no mass.     Tenderness: There is no abdominal tenderness. There is no guarding.     Hernia: No hernia is present.  Musculoskeletal:     Cervical back: Neck supple.     Right lower leg:  No edema.     Left lower leg: No edema.  Lymphadenopathy:     Cervical: No cervical adenopathy.  Skin:    Coloration: Skin is pale.  Neurological:     Mental Status: She is alert.     Cranial Nerves: Cranial nerves 2-12 are intact.     Sensory: Sensation is intact.     Motor: Weakness present. No tremor or atrophy.     Coordination: Romberg sign positive. Coordination abnormal.     Gait: Gait abnormal.     Deep Tendon Reflexes: Reflexes normal.     Reflex Scores:      Tricep reflexes are 1+ on the right side and 1+ on the left side.      Bicep reflexes are 1+ on the right side and 1+ on the left side.      Brachioradialis reflexes are 2+ on the right side and 2+ on the left side.      Patellar reflexes are 0 on the right side and 0 on the left side.      Achilles reflexes are 0 on the right side and 0 on the left side. Psychiatric:        Attention and Perception: She is inattentive.        Speech: She is noncommunicative.        Behavior: Behavior is withdrawn.     Lab Results  Component Value Date   WBC 5.2 09/17/2023   HGB 11.7 (L) 09/17/2023   HCT 35.9 (L) 09/17/2023   PLT 131.0 (L) 09/17/2023   GLUCOSE 150 (H)  09/17/2023   CHOL 129 03/19/2022   TRIG 40 03/19/2022   HDL 47 03/19/2022   LDLCALC 74 03/19/2022   ALT 9 09/17/2023   AST 14 09/17/2023   NA 142 09/17/2023   K 3.7 09/17/2023   CL 100 09/17/2023   CREATININE 2.19 (H) 09/17/2023   BUN 32 (H) 09/17/2023   CO2 33 (H) 09/17/2023   TSH 1.578 05/16/2023   INR 3.8 (A) 10/13/2023   HGBA1C 6.9 (H) 08/25/2023   MICROALBUR 63.9 (H) 04/20/2018    CT ABDOMEN PELVIS WO CONTRAST Result Date: 09/06/2023 CLINICAL DATA:  Postmenopausal, pelvic pain and right inguinal pain. Status post hysterectomy. EXAM: CT ABDOMEN AND PELVIS WITHOUT CONTRAST TECHNIQUE: Multidetector CT imaging of the abdomen and pelvis was performed following the standard protocol without IV contrast. RADIATION DOSE REDUCTION: This exam was performed according to the departmental dose-optimization program which includes automated exposure control, adjustment of the mA and/or kV according to patient size and/or use of iterative reconstruction technique. COMPARISON:  03/30/2014 FINDINGS: Lower chest: Mild bibasilar pulmonary scarring. Cardiac enlargement and visible pacemaker leads. Hepatobiliary: No focal liver abnormality is seen. Status post cholecystectomy. No biliary dilatation. Pancreas: Significant interval enlargement of a cystic lesion emanating from the distal body of the pancreas measuring roughly 4.2 x 3.4 x 3.5 cm and demonstrating internal cystic density. This previously measured approximately 1.3-1.5 cm in 2015. Adjacent cystic lesion just anterior to the dominant cystic lesion measures approximately 1.4 cm. Typical recommendation would be further evaluation with MRI. If the patient cannot have an MRI due to the pacemaker, the next recommendation would be a CT of the abdomen with and without contrast utilizing pancreatic protocol. However, it does appear that the patient has significant underlying chronic kidney disease. Therefore, referral to gastroenterology with consideration of  endoscopic ultrasound characterization and possible EUS sampling may be the next best step. Spleen: Normal in size without focal abnormality.  Adrenals/Urinary Tract: Bilateral renal atrophy. No hydronephrosis, focal lesion or calculi. The bladder is unremarkable. Stomach/Bowel: Bowel shows no evidence of obstruction, ileus, inflammation or lesion. The appendix appears to have been removed previously. No free intraperitoneal air. Vascular/Lymphatic: Mild atherosclerosis of the abdominal aorta without aneurysm. No lymphadenopathy identified. Reproductive: Status post hysterectomy. No adnexal masses. Other: Small amount of free fluid in the dependent pelvis. No findings to suggest obvious loculated abscess. No hernias identified. Musculoskeletal: No acute or significant osseous findings. IMPRESSION: 1. Significant enlargement of a cystic lesion emanating from the distal body of the pancreas measuring roughly 4.2 x 3.4 x 3.5 cm and demonstrating internal cystic density. This previously measured approximately 1.3-1.5 cm in 2015. Adjacent cystic lesion just anterior to the dominant cystic lesion measures approximately 1.4 cm. See above discussion regarding recommendations. Given pacemaker and chronic kidney disease, referral to gastroenterology for possible EUS characterization may be necessary given limitations for further imaging characterization. 2. Small amount of free fluid in the dependent pelvis. No findings to suggest obvious loculated abscess. 3. Bilateral renal atrophy. 4. Aortic atherosclerosis. Electronically Signed   By: Irish Lack M.D.   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  Sanda Linger, MD

## 2023-10-13 NOTE — Patient Instructions (Addendum)
 Pre visit review using our clinic review tool, if applicable. No additional management support is needed unless otherwise documented below in the visit note.  Hold dose today and then change weekly dose to take 1 tablet daily except take 1/2 tablet on Monday, Wednesday and Friday until starting instructions below for the Lovenox bridge 3/21: Take last dose of warfarin 3/22: NO warfarin, NO Lovenox 3/23: NO warfarin, inject Lovenox once in the PM 3/24: NO warfarin, inject Lovenox once in the PM 3/25: NO warfarin, NO Lovenox   3/26: SURGERY; NO WARFARIN, NO LOVENOX   3/27: Take 1 1/2 tablets (3.75 mg) warfarin, inject Lovenox once in the PM 3/28: Take 1 tablet (2.5 mg) warfarin, inject Lovenox once in the PM 3/29: Take 1 1/2 tablets (3.75 mg) warfarin, inject Lovenox once in the PM 3/30: Take 1 tablet (2.5 mg) warfarin, inject Lovenox once in the PM 3/31: Take 1/2 tablet (1.25 mg) warfarin, inject Lovenox once in the PM 4/1: Recheck INR; NO WARFARIN AND NO LOVENOX UNTIL AFTER INR CHECK

## 2023-10-15 ENCOUNTER — Other Ambulatory Visit: Payer: Self-pay

## 2023-10-15 ENCOUNTER — Encounter: Payer: Self-pay | Admitting: Internal Medicine

## 2023-10-15 ENCOUNTER — Encounter: Payer: Self-pay | Admitting: Cardiovascular Disease

## 2023-10-15 ENCOUNTER — Ambulatory Visit
Admission: RE | Admit: 2023-10-15 | Discharge: 2023-10-15 | Disposition: A | Discharge status: Home / Self Care | Source: Ambulatory Visit | Attending: Internal Medicine | Admitting: Internal Medicine

## 2023-10-15 DIAGNOSIS — R4182 Altered mental status, unspecified: Secondary | ICD-10-CM | POA: Diagnosis not present

## 2023-10-15 DIAGNOSIS — F0392 Unspecified dementia, unspecified severity, with psychotic disturbance: Secondary | ICD-10-CM

## 2023-10-15 DIAGNOSIS — S0990XA Unspecified injury of head, initial encounter: Secondary | ICD-10-CM

## 2023-10-15 DIAGNOSIS — S0003XA Contusion of scalp, initial encounter: Secondary | ICD-10-CM | POA: Diagnosis not present

## 2023-10-15 DIAGNOSIS — M810 Age-related osteoporosis without current pathological fracture: Secondary | ICD-10-CM

## 2023-10-15 DIAGNOSIS — R270 Ataxia, unspecified: Secondary | ICD-10-CM

## 2023-10-15 MED ORDER — DENOSUMAB 60 MG/ML ~~LOC~~ SOSY: 60.0000 mg | PREFILLED_SYRINGE | Freq: Once | SUBCUTANEOUS | Status: DC

## 2023-10-16 DIAGNOSIS — I5189 Other ill-defined heart diseases: Secondary | ICD-10-CM | POA: Diagnosis not present

## 2023-10-16 DIAGNOSIS — E1129 Type 2 diabetes mellitus with other diabetic kidney complication: Secondary | ICD-10-CM | POA: Diagnosis not present

## 2023-10-16 DIAGNOSIS — I129 Hypertensive chronic kidney disease with stage 1 through stage 4 chronic kidney disease, or unspecified chronic kidney disease: Secondary | ICD-10-CM | POA: Diagnosis not present

## 2023-10-16 DIAGNOSIS — D631 Anemia in chronic kidney disease: Secondary | ICD-10-CM | POA: Diagnosis not present

## 2023-10-16 DIAGNOSIS — E1122 Type 2 diabetes mellitus with diabetic chronic kidney disease: Secondary | ICD-10-CM | POA: Diagnosis not present

## 2023-10-16 DIAGNOSIS — N2581 Secondary hyperparathyroidism of renal origin: Secondary | ICD-10-CM | POA: Diagnosis not present

## 2023-10-16 DIAGNOSIS — E559 Vitamin D deficiency, unspecified: Secondary | ICD-10-CM | POA: Diagnosis not present

## 2023-10-16 DIAGNOSIS — N184 Chronic kidney disease, stage 4 (severe): Secondary | ICD-10-CM | POA: Diagnosis not present

## 2023-10-21 ENCOUNTER — Telehealth: Payer: Self-pay | Admitting: Gastroenterology

## 2023-10-21 ENCOUNTER — Encounter (HOSPITAL_COMMUNITY): Payer: Self-pay | Admitting: Gastroenterology

## 2023-10-21 NOTE — Telephone Encounter (Signed)
 Procedure:EUS Procedure date: 10/28/23 Procedure location: [facility] Arrival Time: 9:45 am Spoke with the patient Y/N: Yes, spoke with pt's son Any prep concerns? No  Has the patient obtained the prep from the pharmacy ? No prep needed Do you have a care partner and transportation: Yes Any additional concerns? No

## 2023-10-22 ENCOUNTER — Other Ambulatory Visit

## 2023-10-22 ENCOUNTER — Telehealth: Payer: Self-pay

## 2023-10-22 NOTE — Telephone Encounter (Signed)
 Patient's husband called, states patient is cancelling appointment for EUS on Wed March 26. Pt does not want to reschedule   FYI Dr Linnell Fulling

## 2023-10-22 NOTE — Telephone Encounter (Signed)
 Pt's son, Perlie Gold, reports he cancelled pt's surgery that was scheduled for 3/26 for upper endoscopy [place]. He reports the family discussed it and did not want to risk surgery if the were not going to pursue treatment. Pt has not stopped warfarin yet or started Lovenox injections. Advised to continue prior dosing and RS coumadin clinic apt for next week. Perlie Gold verbalized understanding.

## 2023-10-26 ENCOUNTER — Ambulatory Visit

## 2023-10-26 DIAGNOSIS — R404 Transient alteration of awareness: Secondary | ICD-10-CM | POA: Diagnosis not present

## 2023-10-26 NOTE — Progress Notes (Unsigned)
 EEG complete - results pending

## 2023-10-27 ENCOUNTER — Ambulatory Visit

## 2023-10-27 ENCOUNTER — Encounter: Payer: Self-pay | Admitting: Internal Medicine

## 2023-10-27 DIAGNOSIS — Z7901 Long term (current) use of anticoagulants: Secondary | ICD-10-CM

## 2023-10-27 LAB — POCT INR: INR: 2.9 (ref 2.0–3.0)

## 2023-10-27 NOTE — Addendum Note (Signed)
 Addended by: Van Clines on: 10/27/2023 01:01 PM   Modules accepted: Orders

## 2023-10-27 NOTE — Patient Instructions (Addendum)
 Pre visit review using our clinic review tool, if applicable. No additional management support is needed unless otherwise documented below in the visit note.  Continue 1 tablet daily except take 1/2 tablet on Monday, Wednesday and Friday.

## 2023-10-27 NOTE — Procedures (Signed)
 ELECTROENCEPHALOGRAM REPORT  Date of Study: 10/26/2023  Patient's Name: Holly Simmons MRN: [id number] Date of Birth: August 25, 1940  Referring Provider: Marlowe Kays, PA-C  Clinical History: This is an 82 year old woman with dementia and episodes of confusion/dizziness. EEG for classification.  CNS Active Medications: Lamotrigine, Viibryd, Rexulti, Wellbutrin, Provigil  Technical Summary: A multichannel digital 1-hour EEG recording measured by the international 10-20 system with electrodes applied with paste and impedances below 5000 ohms performed as portable with EKG monitoring in an awake and asleep patient.  Hyperventilation and photic stimulation were not performed.  The digital EEG was referentially recorded, reformatted, and digitally filtered in a variety of bipolar and referential montages for optimal display.   Description: The patient is awake and asleep during the recording.  During maximal wakefulness, there is a symmetric, medium voltage 6 Hz posterior dominant rhythm that attenuates with eye opening. This is admixed with a moderate amount of diffuse 4-5 Hz theta and small amount of diffuse 2-3 Hz delta slowing of the waking background, at times with triphasic morphology.  During drowsiness and sleep, there is an increase in theta and delta slowing of the background with poorly formed vertex waves seen.  There were no epileptiform discharges or electrographic seizures seen.    EKG lead was unremarkable.  Impression: This awake and asleep EEG is abnormal due to mild to moderate diffuse slowing of the waking background.  Clinical Correlation of the above findings indicates diffuse cerebral dysfunction that is non-specific in etiology and can be seen with hypoxic/ischemic injury, toxic/metabolic encephalopathies, neurodegenerative disorders, or medication effect.  The absence of epileptiform discharges does not rule out a clinical diagnosis of epilepsy.  Clinical correlation is  advised.   Patrcia Dolly, M.D.

## 2023-10-27 NOTE — Progress Notes (Addendum)
 Pt was scheduled for surgery for tomorrow but family has decided to cancel surgery. Pt never started Lovenox bridge and never stopped warfarin. Pt had fall off of shower chair yesterday and has a hematoma on her L calf which is swollen and warm. No change in size since last night. Advised if any changes to contact the office by phone. Advised to send a [employer] with pictures to PCP for additional advisement. Enrique Sack, pt's daughter, will send pictures and mychart msg when she gets home. Enrique Sack verbalized understanding.  Continue 1 tablet daily except take 1/2 tablet on Monday, Wednesday and Friday.

## 2023-10-28 ENCOUNTER — Encounter: Payer: Self-pay | Admitting: Emergency Medicine

## 2023-10-28 ENCOUNTER — Ambulatory Visit (HOSPITAL_COMMUNITY): Admission: RE | Admit: 2023-10-28 | Payer: Medicare PPO | Source: Home / Self Care | Admitting: Gastroenterology

## 2023-10-28 ENCOUNTER — Ambulatory Visit: Admitting: Emergency Medicine

## 2023-10-28 VITALS — BP 116/58 | HR 63 | Temp 98.0°F | Ht 63.0 in | Wt 166.0 lb

## 2023-10-28 DIAGNOSIS — T07XXXA Unspecified multiple injuries, initial encounter: Secondary | ICD-10-CM | POA: Diagnosis not present

## 2023-10-28 DIAGNOSIS — I5032 Chronic diastolic (congestive) heart failure: Secondary | ICD-10-CM

## 2023-10-28 DIAGNOSIS — I480 Paroxysmal atrial fibrillation: Secondary | ICD-10-CM | POA: Diagnosis not present

## 2023-10-28 DIAGNOSIS — R2689 Other abnormalities of gait and mobility: Secondary | ICD-10-CM | POA: Insufficient documentation

## 2023-10-28 DIAGNOSIS — Z9181 History of falling: Secondary | ICD-10-CM | POA: Insufficient documentation

## 2023-10-28 SURGERY — UPPER ENDOSCOPIC ULTRASOUND (EUS) RADIAL
Anesthesia: Monitor Anesthesia Care

## 2023-10-28 NOTE — Assessment & Plan Note (Signed)
 2 recent falls No fractures Balance problem Recommend gait physical therapy

## 2023-10-28 NOTE — Patient Instructions (Signed)
 Fall Prevention in the Home, Adult Falls can cause injuries and can happen to people of all ages. There are many things you can do to make your home safer and to help prevent falls. What actions can I take to prevent falls? General information Use good lighting in all rooms. Make sure to: Replace any light bulbs that burn out. Turn on the lights in dark areas and use night-lights. Keep items that you use often in easy-to-reach places. Lower the shelves around your home if needed. Move furniture so that there are clear paths around it. Do not use throw rugs or other things on the floor that can make you trip. If any of your floors are uneven, fix them. Add color or contrast paint or tape to clearly mark and help you see: Grab bars or handrails. First and last steps of staircases. Where the edge of each step is. If you use a ladder or stepladder: Make sure that it is fully opened. Do not climb a closed ladder. Make sure the sides of the ladder are locked in place. Have someone hold the ladder while you use it. Know where your pets are as you move through your home. What can I do in the bathroom?     Keep the floor dry. Clean up any water on the floor right away. Remove soap buildup in the bathtub or shower. Buildup makes bathtubs and showers slippery. Use non-skid mats or decals on the floor of the bathtub or shower. Attach bath mats securely with double-sided, non-slip rug tape. If you need to sit down in the shower, use a non-slip stool. Install grab bars by the toilet and in the bathtub and shower. Do not use towel bars as grab bars. What can I do in the bedroom? Make sure that you have a light by your bed that is easy to reach. Do not use any sheets or blankets on your bed that hang to the floor. Have a firm chair or bench with side arms that you can use for support when you get dressed. What can I do in the kitchen? Clean up any spills right away. If you need to reach something  above you, use a step stool with a grab bar. Keep electrical cords out of the way. Do not use floor polish or wax that makes floors slippery. What can I do with my stairs? Do not leave anything on the stairs. Make sure that you have a light switch at the top and the bottom of the stairs. Make sure that there are handrails on both sides of the stairs. Fix handrails that are broken or loose. Install non-slip stair treads on all your stairs if they do not have carpet. Avoid having throw rugs at the top or bottom of the stairs. Choose a carpet that does not hide the edge of the steps on the stairs. Make sure that the carpet is firmly attached to the stairs. Fix carpet that is loose or worn. What can I do on the outside of my home? Use bright outdoor lighting. Fix the edges of walkways and driveways and fix any cracks. Clear paths of anything that can make you trip, such as tools or rocks. Add color or contrast paint or tape to clearly mark and help you see anything that might make you trip as you walk through a door, such as a raised step or threshold. Trim any bushes or trees on paths to your home. Check to see if handrails are loose  or broken and that both sides of all steps have handrails. Install guardrails along the edges of any raised decks and porches. Have leaves, snow, or ice cleared regularly. Use sand, salt, or ice melter on paths if you live where there is ice and snow during the winter. Clean up any spills in your garage right away. This includes grease or oil spills. What other actions can I take? Review your medicines with your doctor. Some medicines can cause dizziness or changes in blood pressure, which increase your risk of falling. Wear shoes that: Have a low heel. Do not wear high heels. Have rubber bottoms and are closed at the toe. Feel good on your feet and fit well. Use tools that help you move around if needed. These include: Canes. Walkers. Scooters. Crutches. Ask  your doctor what else you can do to help prevent falls. This may include seeing a physical therapist to learn to do exercises to move better and get stronger. Where to find more information Centers for Disease Control and Prevention, [employer]: TonerPromos.no [employer] on Aging: BaseRingTones.pl National Institute on Aging: BaseRingTones.pl Contact a doctor if: You are afraid of falling at home. You feel weak, drowsy, or dizzy at home. You fall at home. Get help right away if you: Lose consciousness or have trouble moving after a fall. Have a fall that causes a head injury. These symptoms may be an emergency. Get help right away. Call 911. Do not wait to see if the symptoms will go away. Do not drive yourself to the hospital. This information is not intended to replace advice given to you by your health care provider. Make sure you discuss any questions you have with your health care provider. Document Revised: 03/24/2022 Document Reviewed: 03/24/2022 Elsevier Patient Education  2024 [employer].

## 2023-10-28 NOTE — Progress Notes (Signed)
 Holly Simmons 83 y.o.   Chief Complaint  Patient presents with   Fall    Patient had a fall 10/26/23 did not go to the ED. This was the earliest available appointment. Patient has bruising and knot on left shin and left hand. She had a fall out of a chair in her bathroom. Patient states she doesn't feel any pain. Patient has issues with balance on and off but has gotten worse within the last couple of months    HISTORY OF PRESENT ILLNESS: Acute problem visit today.  Patient of Dr. Sanda Linger. This is a 83 y.o. female accompanied by daughter, slipped and fell 2 days ago.  Sustained injuries to left lower leg and left hand.  Has bruises but no broken bones. Uses walker.  Concerned about balance problems getting worse over the past couple months. No other complaints or medical concerns today.  Fall Pertinent negatives include no fever, headaches, hematuria, nausea or vomiting.     Prior to Admission medications   Medication Sig Start Date End Date Taking? Authorizing Provider  albuterol (VENTOLIN HFA) 108 (90 Base) MCG/ACT inhaler Inhale 2 puffs into the lungs every 6 (six) hours as needed for wheezing or shortness of breath. 05/18/23  Yes Burnadette Pop, MD  allopurinol (ZYLOPRIM) 100 MG tablet TAKE ONE TABLET BY MOUTH DAILY 06/27/23  Yes Etta Grandchild, MD  Betamethasone Valerate 0.12 % foam Apply 1 application topically daily. Patient taking differently: Apply 1 application  topically once a week. 04/02/23  Yes Etta Grandchild, MD  buPROPion (WELLBUTRIN XL) 300 MG 24 hr tablet Take 1 tablet (300 mg total) by mouth daily. 08/06/23  Yes Etta Grandchild, MD  enoxaparin (LOVENOX) 80 MG/0.8ML injection Inject 0.8 mLs (80 mg total) into the skin daily. USE AS DIRECTED BY ANTICOAGULATION CLINIC 10/13/23  Yes Etta Grandchild, MD  famotidine (PEPCID) 40 MG tablet TAKE ONE TABLET BY MOUTH DAILY 06/27/23  Yes Etta Grandchild, MD  ferrous sulfate 325 (65 FE) MG tablet Take 1 tablet (325 mg  total) by mouth 2 (two) times daily with a meal. Patient taking differently: Take 325 mg by mouth daily with breakfast. 11/10/20  Yes Etta Grandchild, MD  hydrocortisone (ANUSOL-HC) 25 MG suppository Place 1 suppository (25 mg total) rectally 2 (two) times daily. 08/18/23  Yes Plotnikov, Georgina Quint, MD  ipratropium (ATROVENT) 0.03 % nasal spray USE 2 SPRAYS IN EACH NOSTRIL 3 TIMES A DAY AS NEEDED. Patient taking differently: Place 2 sprays into both nostrils daily as needed (nose bleed). 04/29/18  Yes Coralyn Helling, MD  ketoconazole (NIZORAL) 2 % cream Apply 1 Application topically 2 (two) times daily. 07/27/23  Yes Etta Grandchild, MD  lamoTRIgine (LAMICTAL) 25 MG tablet Take 1 tablet (25 mg total) by mouth daily. 08/06/23  Yes Etta Grandchild, MD  linaclotide [facility]) 72 MCG capsule TAKE 1 CAPSULE EVERY DAY BEFORE BREAKFAST 08/24/23  Yes Etta Grandchild, MD  metoprolol tartrate (LOPRESSOR) 25 MG tablet TAKE ONE TABLET BY MOUTH TWICE DAILY 09/24/23  Yes Etta Grandchild, MD  modafinil (PROVIGIL) 100 MG tablet Take 1 tablet (100 mg total) by mouth daily. Patient taking differently: Take 100 mg by mouth daily as needed (sleep disorder). 03/29/18  Yes Coralyn Helling, MD  Pitavastatin Calcium 2 MG TABS TAKE ONE TABLET BY MOUTH DAILY 09/24/23  Yes Etta Grandchild, MD  PRESCRIPTION MEDICATION 1 Dose by Other route at bedtime as needed (sleep). CPAP   Yes [provider]  Propylene Glycol (SYSTANE BALANCE OP) Place 1 drop into both eyes 4 (four) times daily as needed (dry eyes).   Yes [provider]  REXULTI 0.25 MG TABS tablet TAKE 1 TABLET BY MOUTH DAILY 09/24/23  Yes Etta Grandchild, MD  SYMBICORT 80-4.5 MCG/ACT inhaler USE 2 PUFFS TWICE A DAY. Patient taking differently: 2 puffs 2 (two) times daily. 10/11/20  Yes Etta Grandchild, MD  torsemide (DEMADEX) 20 MG tablet Take 1 tablet (20 mg total) by mouth daily. 09/24/23  Yes Etta Grandchild, MD  triamcinolone ointment (KENALOG) 0.1 % Apply 1  Application topically 2 (two) times daily. Use every day - bid 08/18/23  Yes Plotnikov, Georgina Quint, MD  Vilazodone HCl (VIIBRYD) 40 MG TABS TAKE ONE TABLET BY MOUTH DAILY 06/27/23  Yes Etta Grandchild, MD  warfarin (COUMADIN) 2.5 MG tablet TAKE 1 TABLET BY MOUTH DAILY EXCEPT TAKE 1/2 TABLET ON WEDNESDAYS OR AS DIRECTED BY ANTICOAGULATION CLINIC 06/27/23  Yes Etta Grandchild, MD    Allergies  Allergen Reactions   Petrolatum-Zinc Oxide Anaphylaxis, Rash and Swelling   Sulfa Antibiotics Rash   Sulfonamide Derivatives Anaphylaxis and Swelling   Tetanus Toxoid     Other reaction(s): Other (See Comments) OTHER REACTION   Tetanus Toxoids Swelling   Other Rash and Other (See Comments)    STERI - STRIPS  It can affect proteins in her kidneys so she doesn't take REACTION: proteinuria Other reaction(s): Other (See Comments), Unknown It can affect proteins in her kidneys so she doesn't take   Nsaids Other (See Comments)    REACTION: proteinuria Other reaction(s): Other (See Comments), Unknown It can affect proteins in her kidneys so she doesn't take    Tetanus Toxoid, Adsorbed Other (See Comments)    Other reaction(s): Other (See Comments) Turned arm red Turned arm red    Patient Active Problem List   Diagnosis Date Noted   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) 10/13/2023   Ataxia after head trauma 10/13/2023   Head trauma 10/13/2023   Pancreatic mass 09/07/2023   Pelvic pain in female 08/25/2023   Groin pain, right 08/18/2023   Hemorrhoids 08/18/2023   Acute seborrheic dermatitis 07/27/2023   Yeast dermatitis 07/27/2023   Subacute cough 07/27/2023   Delirium due to another medical condition 05/27/2023   Type 2 diabetes mellitus with stage 4 chronic kidney disease, without long-term current use of insulin (HCC) 05/27/2023   COPD (chronic obstructive pulmonary disease) (HCC) 05/16/2023   Hyperglycemia 05/16/2023   Hypercalcemia 05/16/2023   Dementia (HCC) 09/02/2022    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   Severe episode of recurrent major depressive disorder, without psychotic features (HCC) 02/26/2022   A-fib (HCC) 08/31/2021   Thrombocytopenia (HCC) 12/04/2020   Encounter for general adult medical examination with abnormal findings 07/12/2020   Cachexia (HCC) 08/16/2019   Iron deficiency anemia due to chronic blood loss 09/23/2018   Stage 3b chronic kidney disease (CKD) (HCC) 09/12/2018   Microalbuminuria due to type 2 diabetes mellitus (HCC) 04/21/2018   Obesity (BMI 30.0-34.9) 02/05/2016   Osteoporosis with pathological fracture of ankle and foot 05/30/2015   Encounter for monitoring SBE (subacute bacterial endocarditis) prophylaxis 11/22/2014   Gout 08/20/2012   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 06/10/2012   Other screening mammogram 02/04/2012   Hyperlipidemia LDL goal <130 12/04/2011   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   Long term (current) use of anticoagulants 07/18/2011   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  Hypersomnia 08/16/2009   Chronic heart failure with preserved ejection fraction (HFpEF) (HCC) 08/25/2008   GERD 08/10/2008   Obstructive sleep apnea 08/10/2007   Allergic rhinitis 08/09/2007    Past Medical History:  Diagnosis Date   Allergic rhinitis    Asthma    NOS w/ acute exacerbation   Blood transfusion without reported diagnosis    CKD (chronic kidney disease) stage 3, GFR 30-59 ml/min (HCC) 09/12/2018   Colon polyps    TUBULAR ADENOMAS AND HYPERPLASTIC   Complete heart block (HCC)    requiring PPM (MDT) post surgical myomectomy at [facility],  leads are epicardial with abdominal implant, high ventricular threshold at implant   COPD (chronic obstructive pulmonary disease) (HCC)    Depressive disorder    Diastolic dysfunction    DM (diabetes mellitus) (HCC)    Gallstones    GERD (gastroesophageal reflux disease)    Gout 08/20/2012    Heart murmur    Hyperpotassemia    Hypersomnia    Hypertension    Hypertrophic cardiomyopathy (HCC)    s/p surgical myomectomy at [facility] 11/12 complicated by septal VSD post procedure requiring reoperation with patch closure and tricuspid valve replacement   Kidney stones    Myocardial infarction (HCC) 2011   Obstructive sleep apnea    persistent daytime sleepiness despite cpap   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   Psoriasis    Pyuria    Renal insufficiency    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   Typical atrial flutter (HCC) 9/15    Past Surgical History:  Procedure Laterality Date   ABDOMINAL HYSTERECTOMY     APPENDECTOMY     BREAST CYST EXCISION     CARDIOVERSION N/A 08/01/2014   Procedure: CARDIOVERSION;  Surgeon: Vesta Mixer, MD;  Location: [facility] ENDOSCOPY;  Service: Cardiovascular;  Laterality: N/A;   CESAREAN SECTION     CHOLECYSTECTOMY     COLONOSCOPY     EYE SURGERY  01/16/2016   left eye lens implant   EYE SURGERY  01/09/2016   right eye lens implant   INSERT / REPLACE / REMOVE PACEMAKER  07/13/2012   PACEMAKER INSERTION  07/02/2011   epicardial wires with abdominal implant at [facility] 12/12,  high ventricular lead threshold at implant per Dr Christin Fudge   PERMANENT PACEMAKER INSERTION N/A 07/13/2012   Procedure: PERMANENT PACEMAKER INSERTION;  Surgeon: Hillis Range, MD;  Location: [facility] CATH LAB;  Service: Cardiovascular;  Laterality: N/A;   POLYPECTOMY     septal myomectomy for hypertrophic CM  06/23/2011   by Dr Silvestre Mesi at [facility], complicated by septal VSD requiring patch repair and tricuspid valve replacement   TONSILLECTOMY     TOOTH EXTRACTION     TRICUSPID VALVE REPLACEMENT  06/2011   Medtronic 27mm Mosaic tissue valve   VSD REPAIR  06/2011    Social History   Socioeconomic History   Marital status: Widowed    Spouse name: Dimas Aguas   Number of children: 3   Years of education: Not on file   Highest education level: Bachelor's degree (e.g., BA, AB, BS)   Occupational History   Occupation: retired    Associate Professor: RETIRED  Tobacco Use   Smoking status: Never   Smokeless tobacco: Never  Vaping Use   Vaping status: Never Used  Substance and Sexual Activity   Alcohol use: No    Alcohol/week: 0.0 standard drinks of alcohol   Drug use: No   Sexual activity: Not Currently  Other Topics Concern   Not  on file  Social History Narrative   ** Merged History Encounter **    Are you right handed or left handed? left   Are you currently employed ?    What is your current occupation? retired   Do you live at home alone? With daughter   Who lives with you?    What type of home do you live in: 1 story or 2 story? two    Caffeine rarely   Social Drivers of Corporate investment banker Strain: Low Risk  (10/07/2023)   Overall Financial Resource Strain (CARDIA)    Difficulty of Paying Living Expenses: Not hard at all  Food Insecurity: No Food Insecurity (10/07/2023)   Hunger Vital Sign    Worried About Running Out of Food in the Last Year: Never true    Ran Out of Food in the Last Year: Never true  Transportation Needs: No Transportation Needs (10/07/2023)   PRAPARE - Administrator, Civil Service (Medical): No    Lack of Transportation (Non-Medical): No  Physical Activity: Inactive (10/07/2023)   Exercise Vital Sign    Days of Exercise per Week: 0 days    Minutes of Exercise per Session: 0 min  Stress: Stress Concern Present (10/07/2023)   [employer] of Occupational Health - Occupational Stress Questionnaire    Feeling of Stress : Very much  Social Connections: Unknown (10/07/2023)   Social Connection and Isolation Panel [NHANES]    Frequency of Communication with Friends and Family: Patient unable to answer    Frequency of Social Gatherings with Friends and Family: Once a week    Attends Religious Services: Never    Database administrator or Organizations: No    Attends Banker Meetings: Never    Marital Status:  Widowed  Intimate Partner Violence: Not At Risk (10/07/2023)   Humiliation, Afraid, Rape, and Kick questionnaire    Fear of Current or Ex-Partner: No    Emotionally Abused: No    Physically Abused: No    Sexually Abused: No    Family History  Problem Relation Age of Onset   Hypertension Daughter    Heart disease Maternal Grandfather    Heart disease Paternal Grandfather    Bladder Cancer Father    Prostate cancer Father    Cancer Father    Varicose Veins Father    Heart attack Father    Breast cancer Mother    Dementia Mother    Hypertension Mother    Cancer Mother    Ovarian cancer Maternal Aunt    Pancreatic cancer Cousin    Breast cancer Paternal Aunt    Dementia Maternal Aunt        x 2   Dementia Maternal Uncle        x 2   Colon cancer Neg Hx    Rectal cancer Neg Hx    Stomach cancer Neg Hx    Esophageal cancer Neg Hx      Review of Systems  Constitutional: Negative.  Negative for chills and fever.  HENT: Negative.  Negative for congestion and sore throat.   Respiratory: Negative.  Negative for cough and shortness of breath.   Cardiovascular: Negative.  Negative for chest pain and palpitations.  Gastrointestinal:  Negative for nausea and vomiting.  Genitourinary: Negative.  Negative for dysuria and hematuria.  Skin:  Negative for rash.       Hematomas  Neurological:  Negative for dizziness and headaches.  All other systems  reviewed and are negative.   Vitals:   10/28/23 1010  BP: (!) 116/58  Pulse: 63  Temp: 98 F (36.7 C)  SpO2: 96%    Physical Exam Vitals reviewed.  Constitutional:      Appearance: Normal appearance.  HENT:     Head: Normocephalic.  Eyes:     Extraocular Movements: Extraocular movements intact.  Cardiovascular:     Rate and Rhythm: Normal rate. Rhythm irregular.     Heart sounds: Murmur heard.  Pulmonary:     Effort: Pulmonary effort is normal.     Breath sounds: Normal breath sounds.  Abdominal:     Palpations: Abdomen  is soft.     Tenderness: There is no abdominal tenderness.  Skin:    General: Skin is warm and dry.     Capillary Refill: Capillary refill takes less than 2 seconds.     Findings: Bruising present.     Comments: Positive bruising with superficial hematoma left mid shin area. Also bruising to left hand.  Full range of motion.  No deformity.  No signs of fracture.  Neurological:     General: No focal deficit present.     Mental Status: She is alert and oriented to person, place, and time.  Psychiatric:        Mood and Affect: Mood normal.        Behavior: Behavior normal.      ASSESSMENT & PLAN: A total of 42 minutes was spent with the patient and counseling/coordination of care regarding preparing for this visit, review of most recent office visit notes, review of multiple chronic medical conditions under management, review of all medications, management of multiple contusions, need for physical therapy to address balance issues, fall prevention measures, prognosis, documentation and need for follow-up with PCP.  Problem List Items Addressed This Visit       Cardiovascular and Mediastinum   Chronic heart failure with preserved ejection fraction (HFpEF) (HCC)   Clinically euvolemic No findings of acute CHF      A-fib (HCC)   She is on Coumadin         Other   Balance problem - Primary   Leading to recurrent falls Uses walker on a daily basis Recommend gait physical therapy Referral placed today      Relevant Orders   PT gait training   Multiple contusions   Injuries to left hand and left lower leg.  No fractures. Hematomas and bruising on physical exam Able to ambulate with walker      At risk for falls   2 recent falls No fractures Balance problem Recommend gait physical therapy      Other Visit Diagnoses       History of recent fall          Patient Instructions  Fall Prevention in the Home, Adult Falls can cause injuries and can happen to people of  all ages. There are many things you can do to make your home safer and to help prevent falls. What actions can I take to prevent falls? General information Use good lighting in all rooms. Make sure to: Replace any light bulbs that burn out. Turn on the lights in dark areas and use night-lights. Keep items that you use often in easy-to-reach places. Lower the shelves around your home if needed. Move furniture so that there are clear paths around it. Do not use throw rugs or other things on the floor that can make you trip. If any  of your floors are uneven, fix them. Add color or contrast paint or tape to clearly mark and help you see: Grab bars or handrails. First and last steps of staircases. Where the edge of each step is. If you use a ladder or stepladder: Make sure that it is fully opened. Do not climb a closed ladder. Make sure the sides of the ladder are locked in place. Have someone hold the ladder while you use it. Know where your pets are as you move through your home. What can I do in the bathroom?     Keep the floor dry. Clean up any water on the floor right away. Remove soap buildup in the bathtub or shower. Buildup makes bathtubs and showers slippery. Use non-skid mats or decals on the floor of the bathtub or shower. Attach bath mats securely with double-sided, non-slip rug tape. If you need to sit down in the shower, use a non-slip stool. Install grab bars by the toilet and in the bathtub and shower. Do not use towel bars as grab bars. What can I do in the bedroom? Make sure that you have a light by your bed that is easy to reach. Do not use any sheets or blankets on your bed that hang to the floor. Have a firm chair or bench with side arms that you can use for support when you get dressed. What can I do in the kitchen? Clean up any spills right away. If you need to reach something above you, use a step stool with a grab bar. Keep electrical cords out of the way. Do  not use floor polish or wax that makes floors slippery. What can I do with my stairs? Do not leave anything on the stairs. Make sure that you have a light switch at the top and the bottom of the stairs. Make sure that there are handrails on both sides of the stairs. Fix handrails that are broken or loose. Install non-slip stair treads on all your stairs if they do not have carpet. Avoid having throw rugs at the top or bottom of the stairs. Choose a carpet that does not hide the edge of the steps on the stairs. Make sure that the carpet is firmly attached to the stairs. Fix carpet that is loose or worn. What can I do on the outside of my home? Use bright outdoor lighting. Fix the edges of walkways and driveways and fix any cracks. Clear paths of anything that can make you trip, such as tools or rocks. Add color or contrast paint or tape to clearly mark and help you see anything that might make you trip as you walk through a door, such as a raised step or threshold. Trim any bushes or trees on paths to your home. Check to see if handrails are loose or broken and that both sides of all steps have handrails. Install guardrails along the edges of any raised decks and porches. Have leaves, snow, or ice cleared regularly. Use sand, salt, or ice melter on paths if you live where there is ice and snow during the winter. Clean up any spills in your garage right away. This includes grease or oil spills. What other actions can I take? Review your medicines with your doctor. Some medicines can cause dizziness or changes in blood pressure, which increase your risk of falling. Wear shoes that: Have a low heel. Do not wear high heels. Have rubber bottoms and are closed at the toe. Feel good on  your feet and fit well. Use tools that help you move around if needed. These include: Canes. Walkers. Scooters. Crutches. Ask your doctor what else you can do to help prevent falls. This may include seeing a  physical therapist to learn to do exercises to move better and get stronger. Where to find more information Centers for Disease Control and Prevention, STEADI: TonerPromos.no [employer] on Aging: BaseRingTones.pl National Institute on Aging: BaseRingTones.pl Contact a doctor if: You are afraid of falling at home. You feel weak, drowsy, or dizzy at home. You fall at home. Get help right away if you: Lose consciousness or have trouble moving after a fall. Have a fall that causes a head injury. These symptoms may be an emergency. Get help right away. Call 911. Do not wait to see if the symptoms will go away. Do not drive yourself to the hospital. This information is not intended to replace advice given to you by your health care provider. Make sure you discuss any questions you have with your health care provider. Document Revised: 03/24/2022 Document Reviewed: 03/24/2022 Elsevier Patient Education  2024 Elsevier Inc.    Edwina Barth, MD [facility]

## 2023-10-28 NOTE — Assessment & Plan Note (Addendum)
 Leading to recurrent falls Uses walker on a daily basis Recommend gait physical therapy Referral placed today

## 2023-10-28 NOTE — Assessment & Plan Note (Signed)
She is on Coumadin.

## 2023-10-28 NOTE — Assessment & Plan Note (Signed)
 Injuries to left hand and left lower leg.  No fractures. Hematomas and bruising on physical exam Able to ambulate with walker

## 2023-10-28 NOTE — Assessment & Plan Note (Signed)
 Clinically euvolemic No findings of acute CHF

## 2023-11-03 ENCOUNTER — Ambulatory Visit

## 2023-11-09 ENCOUNTER — Other Ambulatory Visit: Payer: Self-pay | Admitting: Internal Medicine

## 2023-11-09 DIAGNOSIS — K5904 Chronic idiopathic constipation: Secondary | ICD-10-CM

## 2023-11-11 NOTE — Addendum Note (Signed)
 Addended by: Geralyn Flash D on: 11/11/2023 02:53 PM   Modules accepted: Orders

## 2023-11-11 NOTE — Progress Notes (Signed)
 Remote pacemaker transmission.

## 2023-11-17 ENCOUNTER — Telehealth: Payer: Self-pay

## 2023-11-17 ENCOUNTER — Encounter (HOSPITAL_COMMUNITY): Payer: Self-pay

## 2023-11-17 ENCOUNTER — Other Ambulatory Visit: Payer: Self-pay

## 2023-11-17 ENCOUNTER — Emergency Department (HOSPITAL_COMMUNITY)

## 2023-11-17 ENCOUNTER — Ambulatory Visit: Admitting: Emergency Medicine

## 2023-11-17 ENCOUNTER — Observation Stay (HOSPITAL_COMMUNITY)
Admission: EM | Admit: 2023-11-17 | Discharge: 2023-11-20 | Disposition: A | Discharge status: Home / Self Care | Attending: Internal Medicine | Admitting: Internal Medicine

## 2023-11-17 ENCOUNTER — Ambulatory Visit

## 2023-11-17 DIAGNOSIS — I5033 Acute on chronic diastolic (congestive) heart failure: Secondary | ICD-10-CM | POA: Diagnosis not present

## 2023-11-17 DIAGNOSIS — E785 Hyperlipidemia, unspecified: Secondary | ICD-10-CM | POA: Diagnosis present

## 2023-11-17 DIAGNOSIS — I959 Hypotension, unspecified: Secondary | ICD-10-CM | POA: Diagnosis not present

## 2023-11-17 DIAGNOSIS — R4182 Altered mental status, unspecified: Secondary | ICD-10-CM | POA: Diagnosis not present

## 2023-11-17 DIAGNOSIS — F0280 Dementia in other diseases classified elsewhere without behavioral disturbance: Secondary | ICD-10-CM

## 2023-11-17 DIAGNOSIS — R531 Weakness: Secondary | ICD-10-CM | POA: Diagnosis present

## 2023-11-17 DIAGNOSIS — Z7901 Long term (current) use of anticoagulants: Secondary | ICD-10-CM | POA: Diagnosis not present

## 2023-11-17 DIAGNOSIS — S0990XA Unspecified injury of head, initial encounter: Secondary | ICD-10-CM | POA: Diagnosis not present

## 2023-11-17 DIAGNOSIS — W19XXXA Unspecified fall, initial encounter: Secondary | ICD-10-CM | POA: Diagnosis not present

## 2023-11-17 DIAGNOSIS — F0390 Unspecified dementia without behavioral disturbance: Secondary | ICD-10-CM | POA: Diagnosis not present

## 2023-11-17 DIAGNOSIS — I130 Hypertensive heart and chronic kidney disease with heart failure and stage 1 through stage 4 chronic kidney disease, or unspecified chronic kidney disease: Secondary | ICD-10-CM | POA: Diagnosis not present

## 2023-11-17 DIAGNOSIS — N289 Disorder of kidney and ureter, unspecified: Secondary | ICD-10-CM

## 2023-11-17 DIAGNOSIS — Z79899 Other long term (current) drug therapy: Secondary | ICD-10-CM | POA: Insufficient documentation

## 2023-11-17 DIAGNOSIS — R410 Disorientation, unspecified: Secondary | ICD-10-CM

## 2023-11-17 DIAGNOSIS — N179 Acute kidney failure, unspecified: Secondary | ICD-10-CM | POA: Diagnosis not present

## 2023-11-17 DIAGNOSIS — J449 Chronic obstructive pulmonary disease, unspecified: Secondary | ICD-10-CM | POA: Diagnosis present

## 2023-11-17 DIAGNOSIS — G309 Alzheimer's disease, unspecified: Principal | ICD-10-CM

## 2023-11-17 DIAGNOSIS — M109 Gout, unspecified: Secondary | ICD-10-CM | POA: Diagnosis not present

## 2023-11-17 DIAGNOSIS — W0110XA Fall on same level from slipping, tripping and stumbling with subsequent striking against unspecified object, initial encounter: Secondary | ICD-10-CM | POA: Insufficient documentation

## 2023-11-17 DIAGNOSIS — R40244 Other coma, without documented Glasgow coma scale score, or with partial score reported, unspecified time: Secondary | ICD-10-CM | POA: Diagnosis not present

## 2023-11-17 DIAGNOSIS — R918 Other nonspecific abnormal finding of lung field: Secondary | ICD-10-CM | POA: Diagnosis not present

## 2023-11-17 DIAGNOSIS — M25552 Pain in left hip: Secondary | ICD-10-CM | POA: Diagnosis not present

## 2023-11-17 DIAGNOSIS — Z950 Presence of cardiac pacemaker: Secondary | ICD-10-CM | POA: Diagnosis not present

## 2023-11-17 DIAGNOSIS — Z043 Encounter for examination and observation following other accident: Secondary | ICD-10-CM | POA: Diagnosis not present

## 2023-11-17 DIAGNOSIS — I517 Cardiomegaly: Secondary | ICD-10-CM | POA: Diagnosis not present

## 2023-11-17 DIAGNOSIS — I509 Heart failure, unspecified: Secondary | ICD-10-CM | POA: Diagnosis not present

## 2023-11-17 DIAGNOSIS — N1832 Chronic kidney disease, stage 3b: Secondary | ICD-10-CM | POA: Diagnosis not present

## 2023-11-17 DIAGNOSIS — I5032 Chronic diastolic (congestive) heart failure: Secondary | ICD-10-CM | POA: Diagnosis present

## 2023-11-17 DIAGNOSIS — I6529 Occlusion and stenosis of unspecified carotid artery: Secondary | ICD-10-CM | POA: Diagnosis not present

## 2023-11-17 DIAGNOSIS — Z9181 History of falling: Secondary | ICD-10-CM | POA: Diagnosis not present

## 2023-11-17 DIAGNOSIS — M50222 Other cervical disc displacement at C5-C6 level: Secondary | ICD-10-CM | POA: Diagnosis not present

## 2023-11-17 DIAGNOSIS — I6782 Cerebral ischemia: Secondary | ICD-10-CM | POA: Diagnosis not present

## 2023-11-17 DIAGNOSIS — R0902 Hypoxemia: Secondary | ICD-10-CM | POA: Diagnosis not present

## 2023-11-17 DIAGNOSIS — R079 Chest pain, unspecified: Secondary | ICD-10-CM | POA: Diagnosis not present

## 2023-11-17 LAB — URINALYSIS, ROUTINE W REFLEX MICROSCOPIC
Bilirubin Urine: NEGATIVE
Glucose, UA: NEGATIVE mg/dL
Ketones, ur: NEGATIVE mg/dL
Nitrite: NEGATIVE
Protein, ur: NEGATIVE mg/dL
Specific Gravity, Urine: 1.01 (ref 1.005–1.030)
WBC, UA: >50 WBC/hpf (ref 0–5)
pH: 7 (ref 5.0–8.0)

## 2023-11-17 LAB — CBC WITH DIFFERENTIAL/PLATELET
Abs Immature Granulocytes: 0.03 10*3/uL (ref 0.00–0.07)
Basophils Absolute: 0 10*3/uL (ref 0.0–0.1)
Basophils Relative: 1 %
Eosinophils Absolute: 0.3 10*3/uL (ref 0.0–0.5)
Eosinophils Relative: 4 %
HCT: 33.8 % — ABNORMAL LOW (ref 36.0–46.0)
Hemoglobin: 10.5 g/dL — ABNORMAL LOW (ref 12.0–15.0)
Immature Granulocytes: 1 %
Lymphocytes Relative: 10 %
Lymphs Abs: 0.6 10*3/uL — ABNORMAL LOW (ref 0.7–4.0)
MCH: 30.6 pg (ref 26.0–34.0)
MCHC: 31.1 g/dL (ref 30.0–36.0)
MCV: 98.5 fL (ref 80.0–100.0)
Monocytes Absolute: 0.7 10*3/uL (ref 0.1–1.0)
Monocytes Relative: 12 %
Neutro Abs: 4.4 10*3/uL (ref 1.7–7.7)
Neutrophils Relative %: 72 %
Platelets: 113 10*3/uL — ABNORMAL LOW (ref 150–400)
RBC: 3.43 MIL/uL — ABNORMAL LOW (ref 3.87–5.11)
RDW: 14.6 % (ref 11.5–15.5)
WBC: 6 10*3/uL (ref 4.0–10.5)
nRBC: 0 % (ref 0.0–0.2)

## 2023-11-17 LAB — BASIC METABOLIC PANEL WITH GFR
Anion gap: 8 (ref 5–15)
BUN: 49 mg/dL — ABNORMAL HIGH (ref 8–23)
CO2: 29 mmol/L (ref 22–32)
Calcium: 10.7 mg/dL — ABNORMAL HIGH (ref 8.9–10.3)
Chloride: 102 mmol/L (ref 98–111)
Creatinine, Ser: 3.09 mg/dL — ABNORMAL HIGH (ref 0.44–1.00)
GFR, Estimated: 15 mL/min — ABNORMAL LOW (ref >60)
Glucose, Bld: 116 mg/dL — ABNORMAL HIGH (ref 70–99)
Potassium: 4.3 mmol/L (ref 3.5–5.1)
Sodium: 139 mmol/L (ref 135–145)

## 2023-11-17 LAB — HEPATIC FUNCTION PANEL
ALT: 15 U/L (ref 0–44)
AST: 19 U/L (ref 15–41)
Albumin: 3.5 g/dL (ref 3.5–5.0)
Alkaline Phosphatase: 151 U/L — ABNORMAL HIGH (ref 38–126)
Bilirubin, Direct: 0.3 mg/dL — ABNORMAL HIGH (ref 0.0–0.2)
Indirect Bilirubin: 0.5 mg/dL (ref 0.3–0.9)
Total Bilirubin: 0.8 mg/dL (ref 0.0–1.2)
Total Protein: 6.8 g/dL (ref 6.5–8.1)

## 2023-11-17 LAB — TSH: TSH: 2.365 u[IU]/mL (ref 0.350–4.500)

## 2023-11-17 LAB — PROTIME-INR
INR: 2.3 — ABNORMAL HIGH (ref 0.8–1.2)
Prothrombin Time: 25.2 s — ABNORMAL HIGH (ref 11.4–15.2)

## 2023-11-17 LAB — AMMONIA: Ammonia: <10 umol/L (ref 9–35)

## 2023-11-17 LAB — ETHANOL: Alcohol, Ethyl (B): <10 mg/dL (ref <10)

## 2023-11-17 MED ORDER — LAMOTRIGINE 25 MG PO TABS
25.0000 mg | ORAL_TABLET | Freq: Every day | ORAL | Status: DC
  Administered 2023-11-18 – 2023-11-19 (×2): 25 mg via ORAL
  Filled 2023-11-17 (×2): qty 1

## 2023-11-17 MED ORDER — PRAVASTATIN SODIUM 40 MG PO TABS
40.0000 mg | ORAL_TABLET | Freq: Every day | ORAL | Status: DC
  Administered 2023-11-18 – 2023-11-19 (×2): 40 mg via ORAL
  Filled 2023-11-17 (×2): qty 1

## 2023-11-17 MED ORDER — LINACLOTIDE 72 MCG PO CAPS
72.0000 ug | ORAL_CAPSULE | Freq: Every day | ORAL | Status: DC
  Administered 2023-11-18 – 2023-11-20 (×3): 72 ug via ORAL
  Filled 2023-11-17 (×3): qty 1

## 2023-11-17 MED ORDER — FERROUS SULFATE 325 (65 FE) MG PO TABS
325.0000 mg | ORAL_TABLET | Freq: Every day | ORAL | Status: DC
  Administered 2023-11-18 – 2023-11-20 (×3): 325 mg via ORAL
  Filled 2023-11-17 (×3): qty 1

## 2023-11-17 MED ORDER — METOPROLOL TARTRATE 25 MG PO TABS
25.0000 mg | ORAL_TABLET | Freq: Two times a day (BID) | ORAL | Status: DC
  Administered 2023-11-17 – 2023-11-20 (×5): 25 mg via ORAL
  Filled 2023-11-17 (×6): qty 1

## 2023-11-17 MED ORDER — ALLOPURINOL 100 MG PO TABS
100.0000 mg | ORAL_TABLET | Freq: Every day | ORAL | Status: DC
  Administered 2023-11-18 – 2023-11-20 (×3): 100 mg via ORAL
  Filled 2023-11-17 (×3): qty 1

## 2023-11-17 MED ORDER — IPRATROPIUM-ALBUTEROL 0.5-2.5 (3) MG/3ML IN SOLN: 3.0000 mL | Freq: Four times a day (QID) | RESPIRATORY_TRACT | Status: DC | PRN

## 2023-11-17 MED ORDER — WARFARIN 1.25 MG HALF TABLET
1.2500 mg | ORAL_TABLET | Freq: Once | ORAL | Status: AC
  Administered 2023-11-17: 1.25 mg via ORAL
  Filled 2023-11-17: qty 1

## 2023-11-17 MED ORDER — FLUTICASONE FUROATE-VILANTEROL 100-25 MCG/ACT IN AEPB
1.0000 | INHALATION_SPRAY | Freq: Every day | RESPIRATORY_TRACT | Status: DC
  Administered 2023-11-18 – 2023-11-19 (×2): 1 via RESPIRATORY_TRACT
  Filled 2023-11-17: qty 28

## 2023-11-17 MED ORDER — SODIUM CHLORIDE 0.9 % IV BOLUS
500.0000 mL | Freq: Once | INTRAVENOUS | Status: AC
Start: 2023-11-17 — End: 2023-11-17
  Administered 2023-11-17: 500 mL via INTRAVENOUS

## 2023-11-17 MED ORDER — SODIUM CHLORIDE 0.9 % IV SOLN: INTRAVENOUS | Status: DC

## 2023-11-17 MED ORDER — MELATONIN 3 MG PO TABS
3.0000 mg | ORAL_TABLET | Freq: Every evening | ORAL | Status: DC | PRN
  Administered 2023-11-18: 3 mg via ORAL
  Filled 2023-11-17: qty 1

## 2023-11-17 MED ORDER — WARFARIN - PHARMACIST DOSING INPATIENT: Freq: Every day | Status: DC

## 2023-11-17 MED ORDER — FAMOTIDINE 20 MG PO TABS: 40.0000 mg | ORAL_TABLET | Freq: Every day | ORAL | Status: DC

## 2023-11-17 MED ORDER — FAMOTIDINE 20 MG PO TABS
10.0000 mg | ORAL_TABLET | Freq: Every day | ORAL | Status: DC
  Administered 2023-11-18 – 2023-11-20 (×3): 10 mg via ORAL
  Filled 2023-11-17 (×3): qty 1

## 2023-11-17 NOTE — Telephone Encounter (Signed)
 Pt's son, Lenise Quince, reports pt had declined severely in the last 24 hours. Pt is bed ridden and has not been able to get up to even go to the bathroom. He is able to help her. He reports pt had a fall yesterday but did not hit her head. Lenise Quince thinks pt is near end of life. Pt has a coumadin clinic apt today and also an apt with Dr. Sagardia for the fall and lack of muscle control.  Sherran Dock wants to cancel both apts and is going to call EMS to take pt to hospital. Advised apts would be cancelled and a msg sent to PCP.

## 2023-11-17 NOTE — Progress Notes (Signed)
 PHARMACY - ANTICOAGULATION CONSULT NOTE  Pharmacy Consult for Warfarin Indication: atrial fibrillation/Flutter  Allergies  Allergen Reactions   Petrolatum-Zinc Oxide Anaphylaxis, Rash and Swelling   Sulfa Antibiotics Rash   Sulfonamide Derivatives Anaphylaxis and Swelling   Tetanus Toxoids Swelling   Other Rash and Other (See Comments)    STERI - STRIPS  It can affect proteins in her kidneys so she doesn't take,  proteinuria   Nsaids Other (See Comments)    proteinuria, It can affect proteins in her kidneys so she doesn't take    Tetanus Toxoid, Adsorbed Other (See Comments)    Turned arm red    Patient Measurements: Weight: 75.3 kg (166 lb)  Vital Signs: Temp: 98.1 F (36.7 C) (04/15 2029) Temp Source: Oral (04/15 2029) BP: 131/53 (04/15 2029) Pulse Rate: 60 (04/15 2029)  Labs: Recent Labs    11/17/23 1132 11/17/23 1956  HGB 10.5*  --   HCT 33.8*  --   PLT 113*  --   LABPROT  --  25.2*  INR  --  2.3*  CREATININE 3.09*  --     Estimated Creatinine Clearance: 14.1 mL/min (A) (by C-G formula based on SCr of 3.09 mg/dL (H)).   Medical History: Past Medical History:  Diagnosis Date   Allergic rhinitis    Asthma    NOS w/ acute exacerbation   Blood transfusion without reported diagnosis    CKD (chronic kidney disease) stage 3, GFR 30-59 ml/min (HCC) 09/12/2018   Colon polyps    TUBULAR ADENOMAS AND HYPERPLASTIC   Complete heart block (HCC)    requiring PPM (MDT) post surgical myomectomy at [facility],  leads are epicardial with abdominal implant, high ventricular threshold at implant   COPD (chronic obstructive pulmonary disease) (HCC)    Depressive disorder    Diastolic dysfunction    DM (diabetes mellitus) (HCC)    Gallstones    GERD (gastroesophageal reflux disease)    Gout 08/20/2012   Heart murmur    Hyperpotassemia    Hypersomnia    Hypertension    Hypertrophic cardiomyopathy (HCC)    s/p surgical myomectomy at [facility] 11/12 complicated by septal VSD post  procedure requiring reoperation with patch closure and tricuspid valve replacement   Kidney stones    Myocardial infarction (HCC) 2011   Obstructive sleep apnea    persistent daytime sleepiness despite cpap   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   Psoriasis    Pyuria    Renal insufficiency    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 Patient with poor PO intake, weakness, fall with head strike. Patient is confused at baseline with dementia. INR on admit 2.3. Pharmacy consulted for warfarin.   PTA warfarin 1.25mg  MWF, 2.5mg  all other days   Goal of Therapy:  INR 2-3 Monitor platelets by anticoagulation protocol: Yes   Plan:   Warfarin 1.25mg  Monitor daily INR, CBC, signs/symptoms of bleeding   Dorene Gang, PharmD, BCPS, BCCP Clinical Pharmacist  Please check AMION for all [facility] Pharmacy phone numbers After 10:00 PM, call Main Pharmacy [phone]

## 2023-11-17 NOTE — H&P (Signed)
 History and Physical    Holly Simmons ZOX:[id number] DOB: 1941-06-19 DOA: 11/17/2023  PCP: Arcadio Knuckles, MD   Patient coming from: Home    Chief Complaint: Weakness, falls, increased confusion  HPI: Holly Simmons is a 83 y.o. female with medical history significant of advanced Alzheimer dementia, stroke, COPD, hypertension, hyperlipidemia, tricuspid valve replacement, CKD stage IIIb, GERD, depression, HFpEF, complete heart block status post pacemaker placement who was brought to the emergency department for further evaluation of worsening mentation with confusion, fall at home.  Patient lives with her family at home.  She has been increasingly confused since last 3 to 4 days.  Patient has history of dementia but she has been noticed to have more confusion than her baseline.  Patient also has nursing aide at home.  She usually ambulates with the help of walker but recently she has been so weak that she is not walking.  She also fell and hit her head yesterday.  Patient is confused at baseline.  She also had very poor oral intake at home just having few bites at a time.  She was also hallucinating. Patient seen and examined at the bedside in the emergency department.  Her daughter and son-in-law were at  emergency department .no report of fever, chills, loss of consciousness, chest pain, shortness of breath, abdominal pain, nausea, vomiting, diarrhea , hematic or melena.  No report of dysuria or urinary frequency.  ED Course: Hemodynamically stable in the emergency department.  Lab work showed creatinine of 3(baseline creatinine around 2).  Ammonia level less than 10.  skeletal survey did not show any fracture or dislocation.  CT cervical spine, CT head did not show any acute findings.  Suspected to have UTI, UA/urine culture sent.  Started on IV fluid.  Review of Systems: As per HPI otherwise 10 point review of systems negative.    Past Medical History:  Diagnosis Date    Allergic rhinitis    Asthma    NOS w/ acute exacerbation   Blood transfusion without reported diagnosis    CKD (chronic kidney disease) stage 3, GFR 30-59 ml/min (HCC) 09/12/2018   Colon polyps    TUBULAR ADENOMAS AND HYPERPLASTIC   Complete heart block (HCC)    requiring PPM (MDT) post surgical myomectomy at [facility],  leads are epicardial with abdominal implant, high ventricular threshold at implant   COPD (chronic obstructive pulmonary disease) (HCC)    Depressive disorder    Diastolic dysfunction    DM (diabetes mellitus) (HCC)    Gallstones    GERD (gastroesophageal reflux disease)    Gout 08/20/2012   Heart murmur    Hyperpotassemia    Hypersomnia    Hypertension    Hypertrophic cardiomyopathy (HCC)    s/p surgical myomectomy at [facility] 11/12 complicated by septal VSD post procedure requiring reoperation with patch closure and tricuspid valve replacement   Kidney stones    Myocardial infarction (HCC) 2011   Obstructive sleep apnea    persistent daytime sleepiness despite cpap   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   Psoriasis    Pyuria    Renal insufficiency    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   Typical atrial flutter (HCC) 9/15    Past Surgical History:  Procedure Laterality Date   ABDOMINAL HYSTERECTOMY     APPENDECTOMY     BREAST CYST EXCISION     CARDIOVERSION N/A 08/01/2014   Procedure: CARDIOVERSION;  Surgeon: Lake Pilgrim, MD;  Location: [facility] ENDOSCOPY;  Service: Cardiovascular;  Laterality: N/A;   CESAREAN SECTION     CHOLECYSTECTOMY     COLONOSCOPY     EYE SURGERY  01/16/2016   left eye lens implant   EYE SURGERY  01/09/2016   right eye lens implant   INSERT / REPLACE / REMOVE PACEMAKER  07/13/2012   PACEMAKER INSERTION  07/02/2011   epicardial wires with abdominal implant at [facility] 12/12,  high ventricular lead threshold at implant per Dr Ginny Lair   PERMANENT PACEMAKER INSERTION N/A 07/13/2012   Procedure: PERMANENT PACEMAKER INSERTION;  Surgeon: Jolly Needle, MD;  Location: [facility] CATH LAB;  Service: Cardiovascular;  Laterality: N/A;   POLYPECTOMY     septal myomectomy for hypertrophic CM  06/23/2011   by Dr Minus Amel at [facility], complicated by septal VSD requiring patch repair and tricuspid valve replacement   TONSILLECTOMY     TOOTH EXTRACTION     TRICUSPID VALVE REPLACEMENT  06/2011   Medtronic 27mm Mosaic tissue valve   VSD REPAIR  06/2011     reports that she has never smoked. She has never used smokeless tobacco. She reports that she does not drink alcohol and does not use drugs.  Allergies  Allergen Reactions   Petrolatum-Zinc Oxide Anaphylaxis, Rash and Swelling   Sulfa Antibiotics Rash   Sulfonamide Derivatives Anaphylaxis and Swelling   Tetanus Toxoids Swelling   Other Rash and Other (See Comments)    STERI - STRIPS  It can affect proteins in her kidneys so she doesn't take,  proteinuria   Nsaids Other (See Comments)    proteinuria, It can affect proteins in her kidneys so she doesn't take    Tetanus Toxoid, Adsorbed Other (See Comments)    Turned arm red    Family History  Problem Relation Age of Onset   Hypertension Daughter    Heart disease Maternal Grandfather    Heart disease Paternal Grandfather    Bladder Cancer Father    Prostate cancer Father    Cancer Father    Varicose Veins Father    Heart attack Father    Breast cancer Mother    Dementia Mother    Hypertension Mother    Cancer Mother    Ovarian cancer Maternal Aunt    Pancreatic cancer Cousin    Breast cancer Paternal Aunt    Dementia Maternal Aunt        x 2   Dementia Maternal Uncle        x 2   Colon cancer Neg Hx    Rectal cancer Neg Hx    Stomach cancer Neg Hx    Esophageal cancer Neg Hx      Prior to Admission medications   Medication Sig Start Date End Date Taking? Authorizing Provider  albuterol (VENTOLIN HFA) 108 (90 Base) MCG/ACT inhaler Inhale 2 puffs into the lungs every 6 (six) hours as needed for wheezing or shortness of breath.  05/18/23   Kemesha Mosey, MD  allopurinol (ZYLOPRIM) 100 MG tablet TAKE ONE TABLET BY MOUTH DAILY 06/27/23   Arcadio Knuckles, MD  Betamethasone Valerate 0.12 % foam Apply 1 application topically daily. Patient taking differently: Apply 1 application  topically once a week. 04/02/23   Arcadio Knuckles, MD  buPROPion (WELLBUTRIN XL) 300 MG 24 hr tablet Take 1 tablet (300 mg total) by mouth daily. 08/06/23   Arcadio Knuckles, MD  enoxaparin (LOVENOX) 80 MG/0.8ML injection Inject 0.8 mLs (80 mg total) into the skin daily. USE AS DIRECTED BY ANTICOAGULATION  CLINIC 10/13/23   Etta Grandchild, MD  famotidine (PEPCID) 40 MG tablet TAKE ONE TABLET BY MOUTH DAILY 06/27/23   Etta Grandchild, MD  ferrous sulfate 325 (65 FE) MG tablet Take 1 tablet (325 mg total) by mouth 2 (two) times daily with a meal. Patient taking differently: Take 325 mg by mouth daily with breakfast. 11/10/20   Etta Grandchild, MD  hydrocortisone (ANUSOL-HC) 25 MG suppository Place 1 suppository (25 mg total) rectally 2 (two) times daily. 08/18/23   Plotnikov, Georgina Quint, MD  ipratropium (ATROVENT) 0.03 % nasal spray USE 2 SPRAYS IN EACH NOSTRIL 3 TIMES A DAY AS NEEDED. Patient taking differently: Place 2 sprays into both nostrils daily as needed (nose bleed). 04/29/18   Coralyn Helling, MD  ketoconazole (NIZORAL) 2 % cream Apply 1 Application topically 2 (two) times daily. 07/27/23   Etta Grandchild, MD  lamoTRIgine (LAMICTAL) 25 MG tablet Take 1 tablet (25 mg total) by mouth daily. 08/06/23   Etta Grandchild, MD  LINZESS 72 MCG capsule TAKE 1 CAPSULE EVERY DAY BEFORE BREAKFAST 11/09/23   Etta Grandchild, MD  metoprolol tartrate (LOPRESSOR) 25 MG tablet TAKE ONE TABLET BY MOUTH TWICE DAILY 09/24/23   Etta Grandchild, MD  modafinil (PROVIGIL) 100 MG tablet Take 1 tablet (100 mg total) by mouth daily. Patient taking differently: Take 100 mg by mouth daily as needed (sleep disorder). 03/29/18   Coralyn Helling, MD  Pitavastatin Calcium 2 MG TABS TAKE ONE  TABLET BY MOUTH DAILY 09/24/23   Etta Grandchild, MD  PRESCRIPTION MEDICATION 1 Dose by Other route at bedtime as needed (sleep). CPAP    [provider]  Propylene Glycol (SYSTANE BALANCE OP) Place 1 drop into both eyes 4 (four) times daily as needed (dry eyes).    [provider]  REXULTI 0.25 MG TABS tablet TAKE 1 TABLET BY MOUTH DAILY 09/24/23   Etta Grandchild, MD  SYMBICORT 80-4.5 MCG/ACT inhaler USE 2 PUFFS TWICE A DAY. Patient taking differently: 2 puffs 2 (two) times daily. 10/11/20   Etta Grandchild, MD  torsemide (DEMADEX) 20 MG tablet Take 1 tablet (20 mg total) by mouth daily. 09/24/23   Etta Grandchild, MD  triamcinolone ointment (KENALOG) 0.1 % Apply 1 Application topically 2 (two) times daily. Use every day - bid 08/18/23   Plotnikov, Georgina Quint, MD  Vilazodone HCl (VIIBRYD) 40 MG TABS TAKE ONE TABLET BY MOUTH DAILY 06/27/23   Etta Grandchild, MD  warfarin (COUMADIN) 2.5 MG tablet TAKE 1 TABLET BY MOUTH DAILY EXCEPT TAKE 1/2 TABLET ON WEDNESDAYS OR AS DIRECTED BY ANTICOAGULATION CLINIC 06/27/23   Etta Grandchild, MD    Physical Exam: Vitals:   11/17/23 1056 11/17/23 1101 11/17/23 1332 11/17/23 1400  BP:  (!) 147/59  (!) 141/77  Pulse:  64  (!) 59  Resp:  16  16  Temp:  98 F (36.7 C) 97.9 F (36.6 C)   TempSrc:   Rectal   SpO2: 95% 99%  98%    Constitutional: Pleasantly confused elderly female, calm, comfortable Vitals:   11/17/23 1056 11/17/23 1101 11/17/23 1332 11/17/23 1400  BP:  (!) 147/59  (!) 141/77  Pulse:  64  (!) 59  Resp:  16  16  Temp:  98 F (36.7 C) 97.9 F (36.6 C)   TempSrc:   Rectal   SpO2: 95% 99%  98%   Eyes: PERRL, lids and conjunctivae normal ENMT: Mucous membranes are moist.  Neck: normal, supple, no masses, no thyromegaly Respiratory: clear to auscultation bilaterally, no wheezing, no crackles. Normal respiratory effort. No accessory muscle use.  Cardiovascular: Regular rate and rhythm, no murmurs / rubs / gallops. No  extremity edema.  Abdomen: no tenderness, no masses palpated. No hepatosplenomegaly. Bowel sounds positive.  Musculoskeletal: no clubbing / cyanosis. No joint deformity upper and lower extremities.  Skin: no rashes, lesions, ulcers. No induration Neurologic: CN 2-12 grossly intact.  Strength 5/5 in all 4.  Psychiatric: Alert and awake but confused Foley Catheter:None  Labs on Admission: I have personally reviewed following labs and imaging studies  CBC: Recent Labs  Lab 11/17/23 1132  WBC 6.0  NEUTROABS 4.4  HGB 10.5*  HCT 33.8*  MCV 98.5  PLT 113*   Basic Metabolic Panel: Recent Labs  Lab 11/17/23 1132  NA 139  K 4.3  CL 102  CO2 29  GLUCOSE 116*  BUN 49*  CREATININE 3.09*  CALCIUM 10.7*   GFR: CrCl cannot be calculated (Unknown ideal weight.). Liver Function Tests: Recent Labs  Lab 11/17/23 1132  AST 19  ALT 15  ALKPHOS 151*  BILITOT 0.8  PROT 6.8  ALBUMIN 3.5   No results for input(s): "LIPASE", "AMYLASE" in the last 168 hours. Recent Labs  Lab 11/17/23 1244  AMMONIA <10   Coagulation Profile: No results for input(s): "INR", "PROTIME" in the last 168 hours. Cardiac Enzymes: No results for input(s): "CKTOTAL", "CKMB", "CKMBINDEX", "TROPONINI" in the last 168 hours. BNP (last 3 results) No results for input(s): "PROBNP" in the last 8760 hours. HbA1C: No results for input(s): "HGBA1C" in the last 72 hours. CBG: No results for input(s): "GLUCAP" in the last 168 hours. Lipid Profile: No results for input(s): "CHOL", "HDL", "LDLCALC", "TRIG", "CHOLHDL", "LDLDIRECT" in the last 72 hours. Thyroid Function Tests: Recent Labs    11/17/23 1200  TSH 2.365   Anemia Panel: No results for input(s): "VITAMINB12", "FOLATE", "FERRITIN", "TIBC", "IRON", "RETICCTPCT" in the last 72 hours. Urine analysis:    Component Value Date/Time   COLORURINE YELLOW 08/25/2023 1006   APPEARANCEUR Cloudy (A) 08/25/2023 1006   LABSPEC 1.010 08/25/2023 1006   PHURINE 6.0  08/25/2023 1006   GLUCOSEU NEGATIVE 08/25/2023 1006   HGBUR LARGE (A) 08/25/2023 1006   BILIRUBINUR NEGATIVE 08/25/2023 1006   BILIRUBINUR negative 08/07/2023 1907   BILIRUBINUR neg 05/14/2012 1053   KETONESUR NEGATIVE 08/25/2023 1006   PROTEINUR negative 08/07/2023 1907   PROTEINUR 30 (A) 05/15/2023 1935   UROBILINOGEN 1.0 08/25/2023 1006   NITRITE NEGATIVE 08/25/2023 1006   LEUKOCYTESUR MODERATE (A) 08/25/2023 1006    Radiological Exams on Admission: CT Head Wo Contrast Result Date: 11/17/2023 CLINICAL DATA:  Altered mental status, fall yesterday, swelling to back of head. On anticoagulation. EXAM: CT HEAD WITHOUT CONTRAST CT CERVICAL SPINE WITHOUT CONTRAST TECHNIQUE: Multidetector CT imaging of the head and cervical spine was performed following the standard protocol without intravenous contrast. Multiplanar CT image reconstructions of the cervical spine were also generated. RADIATION DOSE REDUCTION: This exam was performed according to the departmental dose-optimization program which includes automated exposure control, adjustment of the mA and/or kV according to patient size and/or use of iterative reconstruction technique. COMPARISON:  CT head 10/15/2023, CT cervical spine 07/12/2020. FINDINGS: CT HEAD FINDINGS Brain: No acute intracranial hemorrhage. No CT evidence of acute infarct. Nonspecific hypoattenuation in the periventricular and subcortical white matter favored to reflect chronic microvascular ischemic changes. No edema, mass effect, or midline shift. The basilar cisterns are patent. Ventricles: The  ventricles are normal. Vascular: Atherosclerotic calcifications of the carotid siphons. No hyperdense vessel. Skull: No acute or aggressive finding. Orbits: Orbits are symmetric. Sinuses: The visualized paranasal sinuses are clear. Other: Mastoid air cells are clear. CT CERVICAL SPINE FINDINGS Alignment: Straightening and slight reversal of the normal cervical lordosis. Trace  anterolisthesis of C3 on C4 and trace retrolisthesis of C5 on C6. No facet subluxation or dislocation. Skull base and vertebrae: No acute fracture. No primary bone lesion or focal pathologic process. Soft tissues and spinal canal: No prevertebral fluid or swelling. No visible canal hematoma. Disc levels: Intervertebral disc space narrowing most pronounced at C5-6. Small disc bulges at multiple levels. No high-grade osseous spinal canal stenosis. Facet arthrosis throughout the cervical spine. Foraminal narrowing most pronounced on the left at C3-4 and C4-5. Upper chest: No acute abnormality of the lung apices. Mildly patulous upper thoracic esophagus. Other: None. IMPRESSION: No CT evidence of acute intracranial abnormality. No acute fracture or traumatic malalignment of the cervical spine. Chronic and degenerative changes as above. Electronically Signed   By: Denny Flack M.D.   On: 11/17/2023 15:03   CT Cervical Spine Wo Contrast Result Date: 11/17/2023 CLINICAL DATA:  Altered mental status, fall yesterday, swelling to back of head. On anticoagulation. EXAM: CT HEAD WITHOUT CONTRAST CT CERVICAL SPINE WITHOUT CONTRAST TECHNIQUE: Multidetector CT imaging of the head and cervical spine was performed following the standard protocol without intravenous contrast. Multiplanar CT image reconstructions of the cervical spine were also generated. RADIATION DOSE REDUCTION: This exam was performed according to the departmental dose-optimization program which includes automated exposure control, adjustment of the mA and/or kV according to patient size and/or use of iterative reconstruction technique. COMPARISON:  CT head 10/15/2023, CT cervical spine 07/12/2020. FINDINGS: CT HEAD FINDINGS Brain: No acute intracranial hemorrhage. No CT evidence of acute infarct. Nonspecific hypoattenuation in the periventricular and subcortical white matter favored to reflect chronic microvascular ischemic changes. No edema, mass effect, or  midline shift. The basilar cisterns are patent. Ventricles: The ventricles are normal. Vascular: Atherosclerotic calcifications of the carotid siphons. No hyperdense vessel. Skull: No acute or aggressive finding. Orbits: Orbits are symmetric. Sinuses: The visualized paranasal sinuses are clear. Other: Mastoid air cells are clear. CT CERVICAL SPINE FINDINGS Alignment: Straightening and slight reversal of the normal cervical lordosis. Trace anterolisthesis of C3 on C4 and trace retrolisthesis of C5 on C6. No facet subluxation or dislocation. Skull base and vertebrae: No acute fracture. No primary bone lesion or focal pathologic process. Soft tissues and spinal canal: No prevertebral fluid or swelling. No visible canal hematoma. Disc levels: Intervertebral disc space narrowing most pronounced at C5-6. Small disc bulges at multiple levels. No high-grade osseous spinal canal stenosis. Facet arthrosis throughout the cervical spine. Foraminal narrowing most pronounced on the left at C3-4 and C4-5. Upper chest: No acute abnormality of the lung apices. Mildly patulous upper thoracic esophagus. Other: None. IMPRESSION: No CT evidence of acute intracranial abnormality. No acute fracture or traumatic malalignment of the cervical spine. Chronic and degenerative changes as above. Electronically Signed   By: Denny Flack M.D.   On: 11/17/2023 15:03   DG Pelvis Portable Result Date: 11/17/2023 CLINICAL DATA:  Fall. EXAM: PORTABLE PELVIS 1-2 VIEWS COMPARISON:  CT abdomen/pelvis dated August 28, 2023. FINDINGS: There is no evidence of acute fracture or diastasis. Femoral heads are seated within the acetabula. Sacroiliac joints and pubic symphysis are anatomically aligned. IMPRESSION: No acute osseous abnormality identified. Electronically Signed   By: Arcola Beards.D.  On: 11/17/2023 13:59   DG Chest Portable 1 View Result Date: 11/17/2023 CLINICAL DATA:  Pain after fall EXAM: PORTABLE CHEST 1 VIEW COMPARISON:  Chest  x-ray 07/27/2023 and older FINDINGS: Enlarged cardiopericardial silhouette with slight prominence of the central vasculature. Calcified aorta. Sternal wires. Left upper chest battery pack with pacemaker leads along the heart. There are separate defibrillator leads overlying the cardiac shadow and extending caudal off the edge of the imaging field. Slight linear opacity left midlung likely scar or atelectasis is stable. No pneumothorax, effusion or consolidation. Degenerative changes of the spine and shoulders. Overall if there is further concern of the sequela of trauma additional workup as clinically appropriate. IMPRESSION: Postop chest. Pacemaker. Separate defibrillator leads. Enlarged cardiac shadow. Left midlung scar or atelectasis. Electronically Signed   By: Adrianna Horde M.D.   On: 11/17/2023 13:52        Assessment/Plan Principal Problem:   AMS (altered mental status) Active Problems:   Gout   Stage 3b chronic kidney disease (CKD) (HCC)   Chronic heart failure with preserved ejection fraction (HFpEF) (HCC)   Long term (current) use of anticoagulants   Hyperlipidemia LDL goal <130   Dementia (HCC)   COPD (chronic obstructive pulmonary disease) (HCC)   At risk for falls   Altered mental status on the background of dementia: Patient has history of dementia but has been more confused from baseline.  This could be secondary to AKI or underlying UTI, or could be simply the progression of dementia.Aaron Aas  UA has been sent.  Report pending.  Follow-up urine culture.  If UA suspicious for UTI, will start on ceftriaxone . Home medications Lamictal, bupropion on hold  AKI in CKD stage IIIb: Baseline creatinine around 2.  Presented with creatinine of 3.  Likely dehydrated.  Continue IV fluid.  Check BMP tomorrow  HFpEF: Appears dehydrated.  Home torsemide at home.  Currently on gentle IV.  History of advanced dementia: Follows with neurology.  Recently had EEG as an outpatient on 3/24 which did  not show any seizure but showed diffuse cerebral dysfunction.  Hyperlipidemia: Takes statin.  Resumed  History of tricuspid valve replacement/HOCM, aortic stenosis: Follows with cardiology .  Will recheck her echocardiogram for her history of aortic stenosis  History of COPD: Continue home inhaler.  Not in an exacerbation.  Continue bronchodilators as needed  Frequent fall/deconditioning/weakness: Lives with family.  Usually ambulates with help of walker.  Currently has been very weak.  PT/OT consulted  History of hypertension: Home metoprolol resumed  History of paroxysmal A-fib/complete heart block: Status post pacemaker placement.  Follows with cardiology.  Takes Coumadin for anticoagulation.  Currently in normal sinus rhythm  H/O gout : Continue albuterol  Goals of care: CODE STATUS DNR.  Goals of care discussed with son and daughter.  They are interested to discuss about hospice options.  Palliative care consulted     Severity of Illness: The appropriate patient status for this patient is OBSERVATION. Observation status is judged to be reasonable and necessary in order to provide the required intensity of service to ensure the patient's safety. The patient's presenting symptoms, physical exam findings, and initial radiographic and laboratory data in the context of their medical condition is felt to place them at decreased risk for further clinical deterioration. Furthermore, it is anticipated that the patient will be medically stable for discharge from the hospital within 2 midnights of admission.    DVT prophylaxis: warfarin Code Status: Full Family Communication: Son and daughter at  bedside Consults called: None     Leona Rake MD [facility]  11/17/2023, 4:03 PM

## 2023-11-17 NOTE — ED Provider Notes (Signed)
 [place] EMERGENCY DEPARTMENT AT [facility] Provider Note   CSN: [id number] Arrival date & time: 11/17/23  1049     History  Chief Complaint  Patient presents with   Holly Simmons    Holly Simmons is a 83 y.o. female.  Patient presents with EMS due to change in mental status and fall yesterday with likely head injury.  Patient mild swelling the back of her head per family.  Patient is monitored by family or nursing aide each day.  No fevers.  Patient normally uses walker and assistance however since the weekend she has been generally weaker than normal.  Family reports on route she returned to close to baseline.  Patient's had this waxing and waning for the past 3 to 4 days.  The history is provided by the patient, medical records, a relative and the EMS personnel.  Fall       Home Medications Prior to Admission medications   Medication Sig Start Date End Date Taking? Authorizing Provider  albuterol (VENTOLIN HFA) 108 (90 Base) MCG/ACT inhaler Inhale 2 puffs into the lungs every 6 (six) hours as needed for wheezing or shortness of breath. 05/18/23   Adhikari, Amrit, MD  allopurinol (ZYLOPRIM) 100 MG tablet TAKE ONE TABLET BY MOUTH DAILY 06/27/23   Arcadio Knuckles, MD  Betamethasone Valerate 0.12 % foam Apply 1 application topically daily. Patient taking differently: Apply 1 application  topically once a week. 04/02/23   Arcadio Knuckles, MD  buPROPion (WELLBUTRIN XL) 300 MG 24 hr tablet Take 1 tablet (300 mg total) by mouth daily. 08/06/23   Arcadio Knuckles, MD  enoxaparin (LOVENOX) 80 MG/0.8ML injection Inject 0.8 mLs (80 mg total) into the skin daily. USE AS DIRECTED BY ANTICOAGULATION CLINIC 10/13/23   Arcadio Knuckles, MD  famotidine (PEPCID) 40 MG tablet TAKE ONE TABLET BY MOUTH DAILY 06/27/23   Arcadio Knuckles, MD  ferrous sulfate 325 (65 FE) MG tablet Take 1 tablet (325 mg total) by mouth 2 (two) times daily with a meal. Patient taking differently: Take 325 mg by  mouth daily with breakfast. 11/10/20   Arcadio Knuckles, MD  hydrocortisone (ANUSOL-HC) 25 MG suppository Place 1 suppository (25 mg total) rectally 2 (two) times daily. 08/18/23   Plotnikov, Aleksei V, MD  ipratropium (ATROVENT) 0.03 % nasal spray USE 2 SPRAYS IN EACH NOSTRIL 3 TIMES A DAY AS NEEDED. Patient taking differently: Place 2 sprays into both nostrils daily as needed (nose bleed). 04/29/18   Sood, Vineet, MD  ketoconazole (NIZORAL) 2 % cream Apply 1 Application topically 2 (two) times daily. 07/27/23   Arcadio Knuckles, MD  lamoTRIgine (LAMICTAL) 25 MG tablet Take 1 tablet (25 mg total) by mouth daily. 08/06/23   Arcadio Knuckles, MD  LINZESS 72 MCG capsule TAKE 1 CAPSULE EVERY DAY BEFORE BREAKFAST 11/09/23   Arcadio Knuckles, MD  metoprolol tartrate (LOPRESSOR) 25 MG tablet TAKE ONE TABLET BY MOUTH TWICE DAILY 09/24/23   Arcadio Knuckles, MD  modafinil (PROVIGIL) 100 MG tablet Take 1 tablet (100 mg total) by mouth daily. Patient taking differently: Take 100 mg by mouth daily as needed (sleep disorder). 03/29/18   Sood, Vineet, MD  Pitavastatin Calcium 2 MG TABS TAKE ONE TABLET BY MOUTH DAILY 09/24/23   Arcadio Knuckles, MD  PRESCRIPTION MEDICATION 1 Dose by Other route at bedtime as needed (sleep). CPAP    [provider]  Propylene Glycol (SYSTANE BALANCE OP) Place 1 drop into both  eyes 4 (four) times daily as needed (dry eyes).    [provider]  REXULTI 0.25 MG TABS tablet TAKE 1 TABLET BY MOUTH DAILY 09/24/23   Arcadio Knuckles, MD  SYMBICORT 80-4.5 MCG/ACT inhaler USE 2 PUFFS TWICE A DAY. Patient taking differently: 2 puffs 2 (two) times daily. 10/11/20   Arcadio Knuckles, MD  torsemide (DEMADEX) 20 MG tablet Take 1 tablet (20 mg total) by mouth daily. 09/24/23   Arcadio Knuckles, MD  triamcinolone ointment (KENALOG) 0.1 % Apply 1 Application topically 2 (two) times daily. Use every day - bid 08/18/23   Plotnikov, Aleksei V, MD  Vilazodone HCl (VIIBRYD) 40 MG TABS TAKE ONE TABLET BY  MOUTH DAILY 06/27/23   Arcadio Knuckles, MD  warfarin (COUMADIN) 2.5 MG tablet TAKE 1 TABLET BY MOUTH DAILY EXCEPT TAKE 1/2 TABLET ON WEDNESDAYS OR AS DIRECTED BY ANTICOAGULATION CLINIC 06/27/23   Arcadio Knuckles, MD      Allergies    Petrolatum-zinc oxide; Sulfa antibiotics; Sulfonamide derivatives; Tetanus toxoids; Other; Nsaids; and Tetanus toxoid, adsorbed    Review of Systems   Review of Systems  Unable to perform ROS: Dementia    Physical Exam Updated Vital Signs BP (!) 141/77   Pulse (!) 59   Temp 97.9 F (36.6 C) (Rectal)   Resp 16   SpO2 98%  Physical Exam Vitals and nursing note reviewed.  Constitutional:      General: She is not in acute distress.    Appearance: She is well-developed.  HENT:     Head: Normocephalic.     Comments: Minimal swelling posterior scalp no hematoma or laceration.  Neck supple full range of motion in neck without significant pain.    Mouth/Throat:     Mouth: Mucous membranes are moist.  Eyes:     General:        Right eye: No discharge.        Left eye: No discharge.     Conjunctiva/sclera: Conjunctivae normal.  Neck:     Trachea: No tracheal deviation.  Cardiovascular:     Rate and Rhythm: Normal rate and regular rhythm.     Heart sounds: No murmur heard. Pulmonary:     Effort: Pulmonary effort is normal.     Breath sounds: Normal breath sounds.  Abdominal:     General: There is no distension.     Palpations: Abdomen is soft.     Tenderness: There is no abdominal tenderness. There is no guarding.  Musculoskeletal:     Cervical back: Normal range of motion and neck supple. No rigidity.  Skin:    General: Skin is warm.     Capillary Refill: Capillary refill takes less than 2 seconds.     Findings: No rash.  Neurological:     General: No focal deficit present.     Mental Status: She is alert.     Cranial Nerves: No cranial nerve deficit.     Comments: Patient moves all extremities equal bilateral no arm or leg drift.  General  weakness legs greater than arms.  Extraocular muscle function intact pupils equal bilateral no meningismus.  Clinical dementia.  Psychiatric:     Comments: Pleasant dementia     ED Results / Procedures / Treatments   Labs (all labs ordered are listed, but only abnormal results are displayed) Labs Reviewed  BASIC METABOLIC PANEL WITH GFR - Abnormal; Notable for the following components:      Result Value   Glucose,  Bld 116 (*)    BUN 49 (*)    Creatinine, Ser 3.09 (*)    Calcium 10.7 (*)    GFR, Estimated 15 (*)    All other components within normal limits  HEPATIC FUNCTION PANEL - Abnormal; Notable for the following components:   Alkaline Phosphatase 151 (*)    Bilirubin, Direct 0.3 (*)    All other components within normal limits  CBC WITH DIFFERENTIAL/PLATELET - Abnormal; Notable for the following components:   RBC 3.43 (*)    Hemoglobin 10.5 (*)    HCT 33.8 (*)    Platelets 113 (*)    Lymphs Abs 0.6 (*)    All other components within normal limits  URINE CULTURE  ETHANOL  AMMONIA  TSH  URINALYSIS, ROUTINE W REFLEX MICROSCOPIC    EKG None  Radiology CT Head Wo Contrast Result Date: 11/17/2023 CLINICAL DATA:  Altered mental status, fall yesterday, swelling to back of head. On anticoagulation. EXAM: CT HEAD WITHOUT CONTRAST CT CERVICAL SPINE WITHOUT CONTRAST TECHNIQUE: Multidetector CT imaging of the head and cervical spine was performed following the standard protocol without intravenous contrast. Multiplanar CT image reconstructions of the cervical spine were also generated. RADIATION DOSE REDUCTION: This exam was performed according to the departmental dose-optimization program which includes automated exposure control, adjustment of the mA and/or kV according to patient size and/or use of iterative reconstruction technique. COMPARISON:  CT head 10/15/2023, CT cervical spine 07/12/2020. FINDINGS: CT HEAD FINDINGS Brain: No acute intracranial hemorrhage. No CT evidence of  acute infarct. Nonspecific hypoattenuation in the periventricular and subcortical white matter favored to reflect chronic microvascular ischemic changes. No edema, mass effect, or midline shift. The basilar cisterns are patent. Ventricles: The ventricles are normal. Vascular: Atherosclerotic calcifications of the carotid siphons. No hyperdense vessel. Skull: No acute or aggressive finding. Orbits: Orbits are symmetric. Sinuses: The visualized paranasal sinuses are clear. Other: Mastoid air cells are clear. CT CERVICAL SPINE FINDINGS Alignment: Straightening and slight reversal of the normal cervical lordosis. Trace anterolisthesis of C3 on C4 and trace retrolisthesis of C5 on C6. No facet subluxation or dislocation. Skull base and vertebrae: No acute fracture. No primary bone lesion or focal pathologic process. Soft tissues and spinal canal: No prevertebral fluid or swelling. No visible canal hematoma. Disc levels: Intervertebral disc space narrowing most pronounced at C5-6. Small disc bulges at multiple levels. No high-grade osseous spinal canal stenosis. Facet arthrosis throughout the cervical spine. Foraminal narrowing most pronounced on the left at C3-4 and C4-5. Upper chest: No acute abnormality of the lung apices. Mildly patulous upper thoracic esophagus. Other: None. IMPRESSION: No CT evidence of acute intracranial abnormality. No acute fracture or traumatic malalignment of the cervical spine. Chronic and degenerative changes as above. Electronically Signed   By: Denny Flack M.D.   On: 11/17/2023 15:03   CT Cervical Spine Wo Contrast Result Date: 11/17/2023 CLINICAL DATA:  Altered mental status, fall yesterday, swelling to back of head. On anticoagulation. EXAM: CT HEAD WITHOUT CONTRAST CT CERVICAL SPINE WITHOUT CONTRAST TECHNIQUE: Multidetector CT imaging of the head and cervical spine was performed following the standard protocol without intravenous contrast. Multiplanar CT image reconstructions of the  cervical spine were also generated. RADIATION DOSE REDUCTION: This exam was performed according to the departmental dose-optimization program which includes automated exposure control, adjustment of the mA and/or kV according to patient size and/or use of iterative reconstruction technique. COMPARISON:  CT head 10/15/2023, CT cervical spine 07/12/2020. FINDINGS: CT HEAD FINDINGS Brain: No acute  intracranial hemorrhage. No CT evidence of acute infarct. Nonspecific hypoattenuation in the periventricular and subcortical white matter favored to reflect chronic microvascular ischemic changes. No edema, mass effect, or midline shift. The basilar cisterns are patent. Ventricles: The ventricles are normal. Vascular: Atherosclerotic calcifications of the carotid siphons. No hyperdense vessel. Skull: No acute or aggressive finding. Orbits: Orbits are symmetric. Sinuses: The visualized paranasal sinuses are clear. Other: Mastoid air cells are clear. CT CERVICAL SPINE FINDINGS Alignment: Straightening and slight reversal of the normal cervical lordosis. Trace anterolisthesis of C3 on C4 and trace retrolisthesis of C5 on C6. No facet subluxation or dislocation. Skull base and vertebrae: No acute fracture. No primary bone lesion or focal pathologic process. Soft tissues and spinal canal: No prevertebral fluid or swelling. No visible canal hematoma. Disc levels: Intervertebral disc space narrowing most pronounced at C5-6. Small disc bulges at multiple levels. No high-grade osseous spinal canal stenosis. Facet arthrosis throughout the cervical spine. Foraminal narrowing most pronounced on the left at C3-4 and C4-5. Upper chest: No acute abnormality of the lung apices. Mildly patulous upper thoracic esophagus. Other: None. IMPRESSION: No CT evidence of acute intracranial abnormality. No acute fracture or traumatic malalignment of the cervical spine. Chronic and degenerative changes as above. Electronically Signed   By: Emily Filbert  M.D.   On: 11/17/2023 15:03   DG Pelvis Portable Result Date: 11/17/2023 CLINICAL DATA:  Fall. EXAM: PORTABLE PELVIS 1-2 VIEWS COMPARISON:  CT abdomen/pelvis dated August 28, 2023. FINDINGS: There is no evidence of acute fracture or diastasis. Femoral heads are seated within the acetabula. Sacroiliac joints and pubic symphysis are anatomically aligned. IMPRESSION: No acute osseous abnormality identified. Electronically Signed   By: Hart Robinsons M.D.   On: 11/17/2023 13:59   DG Chest Portable 1 View Result Date: 11/17/2023 CLINICAL DATA:  Pain after fall EXAM: PORTABLE CHEST 1 VIEW COMPARISON:  Chest x-ray 07/27/2023 and older FINDINGS: Enlarged cardiopericardial silhouette with slight prominence of the central vasculature. Calcified aorta. Sternal wires. Left upper chest battery pack with pacemaker leads along the heart. There are separate defibrillator leads overlying the cardiac shadow and extending caudal off the edge of the imaging field. Slight linear opacity left midlung likely scar or atelectasis is stable. No pneumothorax, effusion or consolidation. Degenerative changes of the spine and shoulders. Overall if there is further concern of the sequela of trauma additional workup as clinically appropriate. IMPRESSION: Postop chest. Pacemaker. Separate defibrillator leads. Enlarged cardiac shadow. Left midlung scar or atelectasis. Electronically Signed   By: Karen Kays M.D.   On: 11/17/2023 13:52    Procedures Procedures    Medications Ordered in ED Medications  sodium chloride 0.9 % bolus 500 mL (has no administration in time range)    ED Course/ Medical Decision Making/ A&P                                 Medical Decision Making Amount and/or Complexity of Data Reviewed Labs: ordered. Radiology: ordered. ECG/medicine tests: ordered.  Risk Decision regarding hospitalization.   Patient with known dementia presents with waxing waning change in mental status and general  weakness with fall yesterday leading to head injury.  Patient close to baseline at this time, no focal neurodeficits.  Discussed broad differential including worsening dementia, intracranial hemorrhage, stroke, metabolic, UTI, other.  Plan for blood work, IV fluids, urinalysis, CT head and neck, pelvic and chest x-ray portable.  Family comfortable with plan.  Blood work independently reviewed hemoglobin 10.5, creatinine elevated 3 previously was around 2.  Patient has acute renal failure unsure cause possibly prerenal plan for small bolus of IV fluids.  No signs of heart failure on exam.  Discussed other differentials medication side effects, blood pressure management, other.  Normal white blood cell count, and urinalysis pending.  Hospitalist paged for observation/admission. X-rays reviewed no acute hip fracture and chronic findings on chest x-ray.  Hospitalist paged for admission.       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

## 2023-11-17 NOTE — Plan of Care (Signed)

## 2023-11-17 NOTE — ED Triage Notes (Signed)
 Pt from home with ems c.o fall yesterday, witnessed by daughter. No loc. Pt is on coumadin. Pt has swelling to the back of her head and c,o left hip pain, no other obvious injuries.  Pt feels warm to touch and has bilateral leg wounds. Pt alert and oriented x2 at baseline. VSS

## 2023-11-18 ENCOUNTER — Observation Stay (HOSPITAL_BASED_OUTPATIENT_CLINIC_OR_DEPARTMENT_OTHER)

## 2023-11-18 DIAGNOSIS — Z515 Encounter for palliative care: Secondary | ICD-10-CM | POA: Diagnosis not present

## 2023-11-18 DIAGNOSIS — I509 Heart failure, unspecified: Secondary | ICD-10-CM | POA: Diagnosis not present

## 2023-11-18 DIAGNOSIS — R40244 Other coma, without documented Glasgow coma scale score, or with partial score reported, unspecified time: Secondary | ICD-10-CM | POA: Diagnosis not present

## 2023-11-18 DIAGNOSIS — Z7189 Other specified counseling: Secondary | ICD-10-CM | POA: Diagnosis not present

## 2023-11-18 DIAGNOSIS — R55 Syncope and collapse: Secondary | ICD-10-CM | POA: Diagnosis not present

## 2023-11-18 LAB — CBC
HCT: 33.5 % — ABNORMAL LOW (ref 36.0–46.0)
Hemoglobin: 10.4 g/dL — ABNORMAL LOW (ref 12.0–15.0)
MCH: 30.9 pg (ref 26.0–34.0)
MCHC: 31 g/dL (ref 30.0–36.0)
MCV: 99.4 fL (ref 80.0–100.0)
Platelets: 109 10*3/uL — ABNORMAL LOW (ref 150–400)
RBC: 3.37 MIL/uL — ABNORMAL LOW (ref 3.87–5.11)
RDW: 14.6 % (ref 11.5–15.5)
WBC: 7.7 10*3/uL (ref 4.0–10.5)
nRBC: 0 % (ref 0.0–0.2)

## 2023-11-18 LAB — BASIC METABOLIC PANEL WITH GFR
Anion gap: 10 (ref 5–15)
BUN: 43 mg/dL — ABNORMAL HIGH (ref 8–23)
CO2: 27 mmol/L (ref 22–32)
Calcium: 11 mg/dL — ABNORMAL HIGH (ref 8.9–10.3)
Chloride: 103 mmol/L (ref 98–111)
Creatinine, Ser: 2.36 mg/dL — ABNORMAL HIGH (ref 0.44–1.00)
GFR, Estimated: 20 mL/min — ABNORMAL LOW (ref >60)
Glucose, Bld: 116 mg/dL — ABNORMAL HIGH (ref 70–99)
Potassium: 4.5 mmol/L (ref 3.5–5.1)
Sodium: 140 mmol/L (ref 135–145)

## 2023-11-18 LAB — ECHOCARDIOGRAM COMPLETE
AR max vel: 1 cm2
AV Area VTI: 0.93 cm2
AV Area mean vel: 0.92 cm2
AV Mean grad: 19 mmHg
AV Peak grad: 32 mmHg
Ao pk vel: 2.83 m/s
Area-P 1/2: 3.43 cm2
S' Lateral: 3.5 cm
Weight: 2656 [oz_av]

## 2023-11-18 LAB — PROTIME-INR
INR: 2.2 — ABNORMAL HIGH (ref 0.8–1.2)
Prothrombin Time: 24.9 s — ABNORMAL HIGH (ref 11.4–15.2)

## 2023-11-18 MED ORDER — SODIUM CHLORIDE 0.9 % IV SOLN
1.0000 g | INTRAVENOUS | Status: AC
  Administered 2023-11-18 – 2023-11-20 (×3): 1 g via INTRAVENOUS
  Filled 2023-11-18 (×3): qty 10

## 2023-11-18 MED ORDER — LORAZEPAM 2 MG/ML IJ SOLN
0.2500 mg | Freq: Once | INTRAMUSCULAR | Status: AC
  Administered 2023-11-18: 0.25 mg via INTRAVENOUS
  Filled 2023-11-18: qty 1

## 2023-11-18 MED ORDER — BREXPIPRAZOLE 0.25 MG PO TABS
0.2500 mg | ORAL_TABLET | Freq: Every morning | ORAL | Status: DC
  Administered 2023-11-18 – 2023-11-20 (×3): 0.25 mg via ORAL
  Filled 2023-11-18 (×3): qty 1

## 2023-11-18 MED ORDER — MELATONIN 3 MG PO TABS
3.0000 mg | ORAL_TABLET | Freq: Every day | ORAL | Status: DC
  Filled 2023-11-18: qty 1

## 2023-11-18 MED ORDER — WARFARIN SODIUM 2.5 MG PO TABS
2.5000 mg | ORAL_TABLET | Freq: Once | ORAL | Status: AC
  Administered 2023-11-18: 2.5 mg via ORAL
  Filled 2023-11-18: qty 1

## 2023-11-18 MED ORDER — ACETAMINOPHEN 325 MG PO TABS
650.0000 mg | ORAL_TABLET | Freq: Four times a day (QID) | ORAL | Status: DC | PRN
  Administered 2023-11-18 – 2023-11-19 (×2): 650 mg via ORAL
  Filled 2023-11-18 (×3): qty 2

## 2023-11-18 MED ORDER — ALPRAZOLAM 0.25 MG PO TABS
0.2500 mg | ORAL_TABLET | Freq: Once | ORAL | Status: AC
  Administered 2023-11-18: 0.25 mg via ORAL
  Filled 2023-11-18: qty 1

## 2023-11-18 NOTE — Progress Notes (Signed)
 PROGRESS NOTE  Holly Simmons  WUJ:[id number] DOB: 1940-08-05 DOA: 11/17/2023 PCP: Etta Grandchild, MD   Brief Narrative:  Holly Simmons is a 83 y.o. female with medical history significant of advanced Alzheimer dementia, stroke, COPD, hypertension, hyperlipidemia, tricuspid valve replacement, CKD stage IIIb, GERD, depression, HFpEF, complete heart block status post pacemaker placement who was brought to the emergency department for further evaluation of worsening mentation with confusion, fall at home. Patient is confused at baseline. She also had very poor oral intake at home just having few bites at a time. She was also hallucinating.   Lab work showed creatinine of 3(baseline creatinine around 2).  Ammonia level less than 10.  skeletal survey did not show any fracture or dislocation.  CT cervical spine, CT head did not show any acute findings.  Suspected to have UTI, UA/urine culture sent.  Started on IV fluid, antibiotic.  Palliative  care following for goals of care  Assessment & Plan:  Principal Problem:   AMS (altered mental status) Active Problems:   Gout   Stage 3b chronic kidney disease (CKD) (HCC)   Chronic heart failure with preserved ejection fraction (HFpEF) (HCC)   Long term (current) use of anticoagulants   Hyperlipidemia LDL goal <130   Dementia (HCC)   COPD (chronic obstructive pulmonary disease) (HCC)   At risk for falls   AKI (acute kidney injury) (HCC)  Altered mental status on the background of dementia: Patient has history of dementia but has been more confused from baseline.  This could be secondary to AKI or underlying UTI, or could be simply the progression of dementia.Marland Kitchen  UA suspicious for UTI, started on ceftriaxone.  Urine culture pending Home medications Lamictal, bupropion on hold   AKI in CKD stage IIIb: Baseline creatinine around 2.  Presented with creatinine of 3.  Likely dehydrated.  Improving   HFpEF: Appears dehydrated.  Home torsemide at  home.  Currently on gentle IV.   History of advanced dementia: Follows with neurology.  Recently had EEG as an outpatient on 3/24 which did not show any seizure but showed diffuse cerebral dysfunction.   Hyperlipidemia: Takes statin.  Resumed   History of tricuspid valve replacement/HOCM, aortic stenosis: Follows with cardiology .  Will recheck her echocardiogram for her history of aortic stenosis   History of COPD: Continue home inhaler.  Not in an exacerbation.  Continue bronchodilators as needed   Frequent fall/deconditioning/weakness: Lives with family.  Usually ambulates with help of walker.  Currently has been very weak.  PT/OT consulted   History of hypertension: Home metoprolol resumed   History of paroxysmal A-fib/complete heart block: Status post pacemaker placement.  Follows with cardiology.  Takes Coumadin for anticoagulation.  Currently in normal sinus rhythm   H/O gout : Continue allopurinol  Insomnia.: Continue  Melatonin   Goals of care: CODE STATUS DNR.  Goals of care discussed with son and daughter.  They are interested to discuss about hospice options.  Palliative care consulted and following.  Family expressed that to monitor at home for next few days before deciding         DVT prophylaxis: warfarin (COUMADIN) tablet 2.5 mg     Code Status: Limited: Do not attempt resuscitation (DNR) -DNR-LIMITED -Do Not Intubate/DNI   Family Communication: Daughter at bedside  Patient status:Inpatient  Patient is from :home  Anticipated discharge NF:AOZH  Estimated DC date:1-2 days   Consultants: Palliative care  Procedures:None  Antimicrobials:  Anti-infectives (From admission, onward)  Start     Dose/Rate Route Frequency Ordered Stop   11/18/23 0930  cefTRIAXone (ROCEPHIN) 1 g in sodium chloride 0.9 % 100 mL IVPB        1 g 200 mL/hr over 30 Minutes Intravenous Every 24 hours 11/18/23 0839         Subjective: Patient seen and examined at bedside  today.  Hemodynamically stable.  Lying on bed.  Confused.  As per the daughter, she did not sleep last night.  She did not look agitated to me.  Afebrile.  Not in any kind of distress  Objective: Vitals:   11/17/23 2029 11/18/23 0030 11/18/23 0452 11/18/23 0839  BP: (!) 131/53 (!) 137/57 (!) 148/72 (!) 134/53  Pulse: 60 (!) 59 61 (!) 59  Resp: 19 19 19 18   Temp: 98.1 F (36.7 C)  97.8 F (36.6 C) 98.2 F (36.8 C)  TempSrc: Oral  Oral Oral  SpO2: 96% 94% 98% 91%  Weight:       No intake or output data in the 24 hours ending 11/18/23 1137 Filed Weights   11/17/23 2007  Weight: 75.3 kg    Examination:  General exam: Overall comfortable, not in distress, pleasantly confused HEENT: PERRL Respiratory system:  no wheezes or crackles  Cardiovascular system: S1 & S2 heard, RRR.  Gastrointestinal system: Abdomen is nondistended, soft and nontender. Central nervous system: Alert and awake but not oriented Extremities: No edema, no clubbing ,no cyanosis Skin: No rashes, no ulcers,no icterus     Data Reviewed: I have personally reviewed following labs and imaging studies  CBC: Recent Labs  Lab 11/17/23 1132 11/18/23 0638  WBC 6.0 7.7  NEUTROABS 4.4  --   HGB 10.5* 10.4*  HCT 33.8* 33.5*  MCV 98.5 99.4  PLT 113* 109*   Basic Metabolic Panel: Recent Labs  Lab 11/17/23 1132 11/18/23 0638  NA 139 140  K 4.3 4.5  CL 102 103  CO2 29 27  GLUCOSE 116* 116*  BUN 49* 43*  CREATININE 3.09* 2.36*  CALCIUM 10.7* 11.0*     No results found for this or any previous visit (from the past 240 hours).   Radiology Studies: CT Head Wo Contrast Result Date: 11/17/2023 CLINICAL DATA:  Altered mental status, fall yesterday, swelling to back of head. On anticoagulation. EXAM: CT HEAD WITHOUT CONTRAST CT CERVICAL SPINE WITHOUT CONTRAST TECHNIQUE: Multidetector CT imaging of the head and cervical spine was performed following the standard protocol without intravenous contrast. Multiplanar  CT image reconstructions of the cervical spine were also generated. RADIATION DOSE REDUCTION: This exam was performed according to the departmental dose-optimization program which includes automated exposure control, adjustment of the mA and/or kV according to patient size and/or use of iterative reconstruction technique. COMPARISON:  CT head 10/15/2023, CT cervical spine 07/12/2020. FINDINGS: CT HEAD FINDINGS Brain: No acute intracranial hemorrhage. No CT evidence of acute infarct. Nonspecific hypoattenuation in the periventricular and subcortical white matter favored to reflect chronic microvascular ischemic changes. No edema, mass effect, or midline shift. The basilar cisterns are patent. Ventricles: The ventricles are normal. Vascular: Atherosclerotic calcifications of the carotid siphons. No hyperdense vessel. Skull: No acute or aggressive finding. Orbits: Orbits are symmetric. Sinuses: The visualized paranasal sinuses are clear. Other: Mastoid air cells are clear. CT CERVICAL SPINE FINDINGS Alignment: Straightening and slight reversal of the normal cervical lordosis. Trace anterolisthesis of C3 on C4 and trace retrolisthesis of C5 on C6. No facet subluxation or dislocation. Skull base and vertebrae: No acute  fracture. No primary bone lesion or focal pathologic process. Soft tissues and spinal canal: No prevertebral fluid or swelling. No visible canal hematoma. Disc levels: Intervertebral disc space narrowing most pronounced at C5-6. Small disc bulges at multiple levels. No high-grade osseous spinal canal stenosis. Facet arthrosis throughout the cervical spine. Foraminal narrowing most pronounced on the left at C3-4 and C4-5. Upper chest: No acute abnormality of the lung apices. Mildly patulous upper thoracic esophagus. Other: None. IMPRESSION: No CT evidence of acute intracranial abnormality. No acute fracture or traumatic malalignment of the cervical spine. Chronic and degenerative changes as above.  Electronically Signed   By: Emily Filbert M.D.   On: 11/17/2023 15:03   CT Cervical Spine Wo Contrast Result Date: 11/17/2023 CLINICAL DATA:  Altered mental status, fall yesterday, swelling to back of head. On anticoagulation. EXAM: CT HEAD WITHOUT CONTRAST CT CERVICAL SPINE WITHOUT CONTRAST TECHNIQUE: Multidetector CT imaging of the head and cervical spine was performed following the standard protocol without intravenous contrast. Multiplanar CT image reconstructions of the cervical spine were also generated. RADIATION DOSE REDUCTION: This exam was performed according to the departmental dose-optimization program which includes automated exposure control, adjustment of the mA and/or kV according to patient size and/or use of iterative reconstruction technique. COMPARISON:  CT head 10/15/2023, CT cervical spine 07/12/2020. FINDINGS: CT HEAD FINDINGS Brain: No acute intracranial hemorrhage. No CT evidence of acute infarct. Nonspecific hypoattenuation in the periventricular and subcortical white matter favored to reflect chronic microvascular ischemic changes. No edema, mass effect, or midline shift. The basilar cisterns are patent. Ventricles: The ventricles are normal. Vascular: Atherosclerotic calcifications of the carotid siphons. No hyperdense vessel. Skull: No acute or aggressive finding. Orbits: Orbits are symmetric. Sinuses: The visualized paranasal sinuses are clear. Other: Mastoid air cells are clear. CT CERVICAL SPINE FINDINGS Alignment: Straightening and slight reversal of the normal cervical lordosis. Trace anterolisthesis of C3 on C4 and trace retrolisthesis of C5 on C6. No facet subluxation or dislocation. Skull base and vertebrae: No acute fracture. No primary bone lesion or focal pathologic process. Soft tissues and spinal canal: No prevertebral fluid or swelling. No visible canal hematoma. Disc levels: Intervertebral disc space narrowing most pronounced at C5-6. Small disc bulges at multiple  levels. No high-grade osseous spinal canal stenosis. Facet arthrosis throughout the cervical spine. Foraminal narrowing most pronounced on the left at C3-4 and C4-5. Upper chest: No acute abnormality of the lung apices. Mildly patulous upper thoracic esophagus. Other: None. IMPRESSION: No CT evidence of acute intracranial abnormality. No acute fracture or traumatic malalignment of the cervical spine. Chronic and degenerative changes as above. Electronically Signed   By: Emily Filbert M.D.   On: 11/17/2023 15:03   DG Pelvis Portable Result Date: 11/17/2023 CLINICAL DATA:  Fall. EXAM: PORTABLE PELVIS 1-2 VIEWS COMPARISON:  CT abdomen/pelvis dated August 28, 2023. FINDINGS: There is no evidence of acute fracture or diastasis. Femoral heads are seated within the acetabula. Sacroiliac joints and pubic symphysis are anatomically aligned. IMPRESSION: No acute osseous abnormality identified. Electronically Signed   By: Hart Robinsons M.D.   On: 11/17/2023 13:59   DG Chest Portable 1 View Result Date: 11/17/2023 CLINICAL DATA:  Pain after fall EXAM: PORTABLE CHEST 1 VIEW COMPARISON:  Chest x-ray 07/27/2023 and older FINDINGS: Enlarged cardiopericardial silhouette with slight prominence of the central vasculature. Calcified aorta. Sternal wires. Left upper chest battery pack with pacemaker leads along the heart. There are separate defibrillator leads overlying the cardiac shadow and extending caudal off the edge  of the imaging field. Slight linear opacity left midlung likely scar or atelectasis is stable. No pneumothorax, effusion or consolidation. Degenerative changes of the spine and shoulders. Overall if there is further concern of the sequela of trauma additional workup as clinically appropriate. IMPRESSION: Postop chest. Pacemaker. Separate defibrillator leads. Enlarged cardiac shadow. Left midlung scar or atelectasis. Electronically Signed   By: Adrianna Horde M.D.   On: 11/17/2023 13:52    Scheduled Meds:   allopurinol  100 mg Oral Daily   brexpiprazole  0.25 mg Oral q AM   famotidine  10 mg Oral Daily   ferrous sulfate  325 mg Oral Q breakfast   fluticasone furoate-vilanterol  1 puff Inhalation Daily   lamoTRIgine  25 mg Oral Q supper   linaclotide  72 mcg Oral QAC breakfast   metoprolol tartrate  25 mg Oral BID   pravastatin  40 mg Oral q1800   warfarin  2.5 mg Oral ONCE-1600   Warfarin - Pharmacist Dosing Inpatient   Does not apply q1600   Continuous Infusions:  sodium chloride 75 mL/hr at 11/17/23 2201   cefTRIAXone (ROCEPHIN)  IV 1 g (11/18/23 1019)     LOS: 0 days   Leona Rake, MD [facility] P4/16/2025, 11:37 AM

## 2023-11-18 NOTE — Care Management Obs Status (Signed)
 MEDICARE OBSERVATION STATUS NOTIFICATION   Patient Details  Name: Holly Simmons MRN: [id number] Date of Birth: Nov 17, 1940   Medicare Observation Status Notification Given:  (P) Yes Spoke with Lenise Quince the patients son on the phone will send him a certified letter    Wynonia Hedges 11/18/2023, 4:20 PM

## 2023-11-18 NOTE — Evaluation (Signed)
 Physical Therapy Evaluation Patient Details Name: Holly Simmons MRN: [id number] DOB: 1941-07-14 Today's Date: 11/18/2023  History of Present Illness  Patient is a 83 yo female presenting to the ED with AMS and fall hitting her head on 11/17/23. CT of cervical spine and head clear. Awaiting culture for potential UTI. PMH significant of CKD stage IIIb, chronic HFpEF, hypertrophic cardiomyopathy status post myomectomy in 2012, CHB status post PPM, bioprosthetic TV, atrial fibrillation on Coumadin, hyperlipidemia, dementia, COPD  Clinical Impression  Pt admitted with above diagnosis and presents to PT with functional limitations due to deficits listed below (See PT problem list). Pt needs skilled PT to maximize independence and safety. Family would like to take pt home if she rebounds and if not they are considering hospice.           If plan is discharge home, recommend the following: A lot of help with walking and/or transfers;A lot of help with bathing/dressing/bathroom;Assist for transportation;Help with stairs or ramp for entrance   Can travel by private vehicle        Equipment Recommendations BSC/3in1  Recommendations for Other Services       Functional Status Assessment Patient has had a recent decline in their functional status and/or demonstrates limited ability to make significant improvements in function in a reasonable and predictable amount of time     Precautions / Restrictions Precautions Precautions: Fall Restrictions Weight Bearing Restrictions Per Provider Order: No      Mobility  Bed Mobility Overal bed mobility: Needs Assistance Bed Mobility: Supine to Sit     Supine to sit: Min assist, +2 for physical assistance, HOB elevated     General bed mobility comments: Assist to bring legs off of bed, elevate trunk into sitting, and bring hips to EOB    Transfers Overall transfer level: Needs assistance Equipment used: Rolling walker (2  wheels) Transfers: Sit to/from Stand Sit to Stand: Mod assist, +2 physical assistance, +2 safety/equipment           General transfer comment: +2 mod assist to bring hips up and for balance on first stand from bed. On second stand +1 mod assist.    Ambulation/Gait Ambulation/Gait assistance: Mod assist, +2 safety/equipment Gait Distance (Feet): 8 Feet Assistive device: Rolling walker (2 wheels) Gait Pattern/deviations: Step-to pattern, Decreased step length - right, Decreased step length - left, Shuffle, Trunk flexed Gait velocity: decr Gait velocity interpretation: <1.31 ft/sec, indicative of household ambulator   General Gait Details: Assist for balance and support  Stairs            Wheelchair Mobility     Tilt Bed    Modified Rankin (Stroke Patients Only)       Balance Overall balance assessment: Needs assistance Sitting-balance support: Feet supported, Single extremity supported Sitting balance-Leahy Scale: Fair   Postural control: Posterior lean Standing balance support: Bilateral upper extremity supported, During functional activity, Reliant on assistive device for balance Standing balance-Leahy Scale: Poor Standing balance comment: walker and min assist for static standing                             Pertinent Vitals/Pain Pain Assessment Pain Assessment: Faces Faces Pain Scale: No hurt    Home Living Family/patient expects to be discharged to:: Private residence Living Arrangements: Children Available Help at Discharge: Family;Personal care attendant;Available 24 hours/day (aide or children) Type of Home: House Home Access: Stairs to enter Entrance Stairs-Rails: None Entrance [employer]  of Steps: 1   Home Layout: Multi-level;Able to live on main level with bedroom/bathroom Home Equipment: Rollator (4 wheels);Shower seat;Shower seat - built in;Wheelchair - manual;Hand held shower head Additional Comments: 2nd shower chair in  bathroom to sit and rest on; has access to manual w/c    Prior Function Prior Level of Function : Needs assist             Mobility Comments: uses rollator primarily, taking shuffled steps as of late ADLs Comments: assists with getting into shower, dressing, has bidet, progressively more assist per daughter     Extremity/Trunk Assessment   Upper Extremity Assessment Upper Extremity Assessment: Defer to OT evaluation RUE Deficits / Details: new tremulous activity affecting fine motor skills LUE Deficits / Details: new tremulous activity affecting fine motor skills    Lower Extremity Assessment Lower Extremity Assessment: Generalized weakness    Cervical / Trunk Assessment Cervical / Trunk Assessment: Kyphotic  Communication   Communication Communication: Impaired Factors Affecting Communication: Hearing impaired    Cognition Arousal: Lethargic, Alert Behavior During Therapy: WFL for tasks assessed/performed   PT - Cognitive impairments: History of cognitive impairments                         Following commands: Impaired Following commands impaired: Follows one step commands with increased time     Cueing Cueing Techniques: Verbal cues, Gestural cues, Tactile cues     General Comments General comments (skin integrity, edema, etc.): VSS on RA    Exercises     Assessment/Plan    PT Assessment Patient needs continued PT services  PT Problem List Decreased strength;Decreased balance;Decreased mobility;Decreased activity tolerance;Decreased knowledge of use of DME       PT Treatment Interventions DME instruction;Gait training;Functional mobility training;Therapeutic activities;Stair training;Therapeutic exercise;Balance training;Patient/family education    PT Goals (Current goals can be found in the Care Plan section)  Acute Rehab PT Goals Patient Stated Goal: not stated PT Goal Formulation: With family Time For Goal Achievement: 12/02/23 Potential  to Achieve Goals: Fair    Frequency Min 2X/week     Co-evaluation PT/OT/SLP Co-Evaluation/Treatment: Yes Reason for Co-Treatment: Necessary to address cognition/behavior during functional activity;For patient/therapist safety PT goals addressed during session: Mobility/safety with mobility;Balance         AM-PAC PT "6 Clicks" Mobility  Outcome Measure Help needed turning from your back to your side while in a flat bed without using bedrails?: A Lot Help needed moving from lying on your back to sitting on the side of a flat bed without using bedrails?: Total Help needed moving to and from a bed to a chair (including a wheelchair)?: A Lot Help needed standing up from a chair using your arms (e.g., wheelchair or bedside chair)?: A Lot Help needed to walk in hospital room?: Total Help needed climbing 3-5 steps with a railing? : Total 6 Click Score: 9    End of Session Equipment Utilized During Treatment: Gait belt Activity Tolerance: Patient tolerated treatment well Patient left: in chair;with call bell/phone within reach;with chair alarm set Nurse Communication: Mobility status PT Visit Diagnosis: Unsteadiness on feet (R26.81);Other abnormalities of gait and mobility (R26.89);Muscle weakness (generalized) (M62.81)    Time: 1610-9604 PT Time Calculation (min) (ACUTE ONLY): 30 min   Charges:                 [facility] PT Acute Rehabilitation Services Office [phone]   Angelina Ok [facility] 11/18/2023, 2:13 PM

## 2023-11-18 NOTE — Progress Notes (Signed)
 Echocardiogram 2D Echocardiogram has been performed.  Holly Simmons RDCS 11/18/2023, 2:21 PM

## 2023-11-18 NOTE — Evaluation (Signed)
 Occupational Therapy Evaluation Patient Details Name: Holly Simmons MRN: [id number] DOB: 1941-07-14 Today's Date: 11/18/2023   History of Present Illness   Patient is a 83 yo female presenting to the ED with AMS and fall hitting her head on 11/17/23. CT of cervical spine and head clear. Awaiting culture for potential UTI. PMH significant of CKD stage IIIb, chronic HFpEF, hypertrophic cardiomyopathy status post myomectomy in 2012, CHB status post PPM, bioprosthetic TV, atrial fibrillation on Coumadin, hyperlipidemia, dementia, COPD     Clinical Impressions Prior to this admission, patient living with her daughter, and between family and personal care attendant, have 24/7 assist for patient. Since last evaluation on 10/24, patient has required assist for all ADL tasks and have been more dependent on her rollator. Currently, patient pleasantly confused, and requiring min A for bed mobility. Patient initially mod A of 2 to stand, but then taking a seated rest break and able to stand and ambulate with mod A with PT and chair follow provided. Patient with need for mod-max A for ADL management. Daughter present for end of session, wanting to take patient home with home health therapy if patient continues to progress. OT recommending HHOT at this time, but will defer to palliatve and goals of care with family for final discharge determination.      If plan is discharge home, recommend the following:   A lot of help with bathing/dressing/bathroom;A little help with walking and/or transfers;Assistance with cooking/housework;Direct supervision/assist for medications management;Direct supervision/assist for financial management;Assist for transportation;Help with stairs or ramp for entrance;Supervision due to cognitive status     Functional Status Assessment   Patient has had a recent decline in their functional status and demonstrates the ability to make significant improvements in function in a  reasonable and predictable amount of time.     Equipment Recommendations   BSC/3in1     Recommendations for Other Services         Precautions/Restrictions   Precautions Precautions: Fall Restrictions Weight Bearing Restrictions Per Provider Order: No     Mobility Bed Mobility Overal bed mobility: Needs Assistance Bed Mobility: Supine to Sit     Supine to sit: Min assist     General bed mobility comments: Min A of 2 to come up into sitting,use of bed pad for trunk and able to incrementally move BLEs    Transfers Overall transfer level: Needs assistance Equipment used: Rolling walker (2 wheels) Transfers: Sit to/from Stand Sit to Stand: Mod assist, +2 physical assistance, +2 safety/equipment           General transfer comment: Initially mod A of 2 to stand with posterior lean, but then taking seated rest break and able to stand with mod A of 1, and ambulate a few feet with chair follow provided      Balance Overall balance assessment: Needs assistance Sitting-balance support: Feet supported, Single extremity supported Sitting balance-Leahy Scale: Fair   Postural control: Posterior lean Standing balance support: Bilateral upper extremity supported, During functional activity, Reliant on assistive device for balance Standing balance-Leahy Scale: Poor Standing balance comment: Reliant on external support                           ADL either performed or assessed with clinical judgement   ADL Overall ADL's : Needs assistance/impaired Eating/Feeding: Moderate assistance;Sitting Eating/Feeding Details (indicate cue type and reason): due to tremors, this is her baseline Grooming: Sitting;Minimal assistance   Upper Body Bathing:  Minimal assistance;Sitting   Lower Body Bathing: Maximal assistance;Sit to/from stand;Sitting/lateral leans;Total assistance   Upper Body Dressing : Minimal assistance;Sitting   Lower Body Dressing: Maximal  assistance;Total assistance;Sit to/from stand;Sitting/lateral leans   Toilet Transfer: Moderate assistance;Ambulation;Regular Toilet;Rolling walker (2 wheels) Toilet Transfer Details (indicate cue type and reason): simulated with functional mobility in room Toileting- Clothing Manipulation and Hygiene: Moderate assistance;Sit to/from stand;Sitting/lateral lean Toileting - Clothing Manipulation Details (indicate cue type and reason): for thoroughness     Functional mobility during ADLs: Moderate assistance;Cueing for safety;Cueing for sequencing;Rolling walker (2 wheels) General ADL Comments: Prior to this admission, patient living with her daughter, and between family and personal care attendant, have 24/7 assist for patient. Since last evaluation on 10/24, patient has required assist for all ADL tasks and have been more dependent on her rollator. Currently, patient pleasantly confused, and requiring min A of 2 for bed mobility. Patient initially mod A of 2 to stand, but then taking a seated rest break and able to stand and ambulate with mod A with PT and chair follow provided. Patient with need for mod-max A for ADL management. Daughter present for end of session, wanting to take patient home with home health therapy if patient continues to progress. OT recommending HHOT at this time, but will defer to palliatve and goals of care with family for final discharge determination.     Vision Baseline Vision/History: 1 Wears glasses Ability to See in Adequate Light: 0 Adequate Patient Visual Report: No change from baseline Vision Assessment?: No apparent visual deficits     Perception Perception: Not tested       Praxis Praxis: Not tested       Pertinent Vitals/Pain Pain Assessment Pain Assessment: Faces Faces Pain Scale: No hurt Pain Intervention(s): Limited activity within patient's tolerance, Monitored during session, Repositioned     Extremity/Trunk Assessment Upper Extremity  Assessment Upper Extremity Assessment: Left hand dominant;Generalized weakness;LUE deficits/detail;RUE deficits/detail RUE Deficits / Details: new tremulous activity affecting fine motor skills LUE Deficits / Details: new tremulous activity affecting fine motor skills   Lower Extremity Assessment Lower Extremity Assessment: Defer to PT evaluation   Cervical / Trunk Assessment Cervical / Trunk Assessment: Kyphotic   Communication Communication Communication: No apparent difficulties   Cognition Arousal: Lethargic, Alert Behavior During Therapy: WFL for tasks assessed/performed Cognition: History of cognitive impairments             OT - Cognition Comments: Patient with dementia at baseline, pleasantly confused, alert at beginning of session, falling asleep by end of session requiring increased effort to arouse                 Following commands: Impaired Following commands impaired: Follows one step commands with increased time     Cueing  General Comments   Cueing Techniques: Verbal cues;Gestural cues;Tactile cues  VSS on RA   Exercises     Shoulder Instructions      Home Living Family/patient expects to be discharged to:: Private residence Living Arrangements: Children Available Help at Discharge: Family;Personal care attendant;Available 24 hours/day (aide or children) Type of Home: House Home Access: Stairs to enter [employer] of Steps: 1 Entrance Stairs-Rails: None Home Layout: Multi-level;Able to live on main level with bedroom/bathroom     Bathroom Shower/Tub: Producer, television/film/video: Handicapped height Bathroom Accessibility: Yes   Home Equipment: Rollator (4 wheels);Shower seat;Shower seat - built in;Wheelchair - manual;Hand held shower head   Additional Comments: 2nd shower chair in bathroom to  sit and rest on; has access to manual w/c      Prior Functioning/Environment Prior Level of Function : Needs assist              Mobility Comments: uses rollator primarily, taking shuffled steps as of late ADLs Comments: assists with getting into shower, dressing, has bidet, progressively more assist per daughter    OT Problem List: Decreased strength;Decreased range of motion;Decreased activity tolerance;Impaired balance (sitting and/or standing);Decreased coordination;Decreased cognition;Decreased safety awareness;Decreased knowledge of use of DME or AE;Decreased knowledge of precautions;Impaired UE functional use   OT Treatment/Interventions: Self-care/ADL training;Therapeutic exercise;Energy conservation;DME and/or AE instruction;Manual therapy;Therapeutic activities;Cognitive remediation/compensation;Patient/family education;Balance training      OT Goals(Current goals can be found in the care plan section)   Acute Rehab OT Goals Patient Stated Goal: per daughter, to get her back home OT Goal Formulation: With patient/family Time For Goal Achievement: 12/02/23 Potential to Achieve Goals: Fair   OT Frequency:  Min 2X/week    Co-evaluation              AM-PAC OT "6 Clicks" Daily Activity     Outcome Measure Help from another person eating meals?: A Lot Help from another person taking care of personal grooming?: A Little Help from another person toileting, which includes using toliet, bedpan, or urinal?: A Lot Help from another person bathing (including washing, rinsing, drying)?: A Lot Help from another person to put on and taking off regular upper body clothing?: A Little Help from another person to put on and taking off regular lower body clothing?: A Lot 6 Click Score: 14   End of Session Equipment Utilized During Treatment: Gait belt;Rolling walker (2 wheels) Nurse Communication: Mobility status  Activity Tolerance: Patient tolerated treatment well Patient left: in chair;with call bell/phone within reach;with chair alarm set  OT Visit Diagnosis: Unsteadiness on feet (R26.81);Other  abnormalities of gait and mobility (R26.89);Muscle weakness (generalized) (M62.81);History of falling (Z91.81);Repeated falls (R29.6);Other symptoms and signs involving cognitive function                Time: 1610-9604 OT Time Calculation (min): 28 min Charges:  OT General Charges $OT Visit: 1 Visit OT Evaluation $OT Eval Moderate Complexity: 1 Mod  Mollie Anger E. Wally Behan, OTR/L Acute Rehabilitation Services [phone]   Vincent Greek 11/18/2023, 11:56 AM

## 2023-11-18 NOTE — Consult Note (Signed)
 [facility]: Referral received for Hospice. I have spoken to the patient son and made aware that referral was received with understanding that he would like to see culture results, receive work up/treatments at least for a couple of days to see if this is truly EOL or anything can be reversible. He acknowledged that this is the case. We discussed options for Home Hospice and Inpatient hospice depending on response to treatment and status. He is open to either and also open to palliative care if pt did not qualify for hospice. I have made him aware that we will follow up daily to determine her status/discuss ongoing goals of care and options. Please call me with any further questions or needs. Glean Lamy, RN [phone] or Lyla Samuels, RN tomorrow at [phone]. Thank you for this referral and the opportunity to serve this patient and family.

## 2023-11-18 NOTE — Consult Note (Signed)
 Palliative Medicine Inpatient Consult Note  Consulting Provider:  Burnadette Pop, MD   Reason for consult:   Palliative Care Consult Services Palliative Medicine Consult  Reason for Consult? Advanced dementia.  CKD.  Family interested  to discuss about goals of care   11/18/2023  HPI:  Per intake H&P -->  Holly Simmons is a 83 y.o. female with medical history significant of advanced Alzheimer dementia, stroke, COPD, hypertension, hyperlipidemia, tricuspid valve replacement, CKD stage IIIb, GERD, depression, HFpEF, complete heart block status post pacemaker placement who was brought to the emergency department for further evaluation of worsening mentation with confusion, fall at home.   The Palliative care team has been asked to support additional goals of care conversations in the setting of chronic co-morbidities.   Clinical Assessment/Goals of Care:  *Please note that this is a verbal dictation therefore any spelling or grammatical errors are due to the "Dragon Medical One" system interpretation.  I have reviewed medical records including EPIC notes, labs and imaging, received report from bedside RN, assessed the patient who is lying in bed disoriented.    I met with patients daughter, Holly Simmons and spoke to her son, Holly Simmons on speaker phone to further discuss diagnosis prognosis, GOC, EOL wishes, disposition and options.   I introduced Palliative Medicine as specialized medical care for people living with serious illness. It focuses on providing relief from the symptoms and stress of a serious illness. The goal is to improve quality of life for both the patient and the family.  Medical History Review and Understanding:  A review of patients symptoms inclusive of CKD, AD, COPD, prior stroke, depression, and pacemaker(s) was completed.   Social History:  Holly Simmons lived in a single family home in [place]. She was married for 53 1/2 years. She had three children though one live  < 24 hours. She has a son, Holly Simmons and daughter, Holly Simmons. She is formerly a Runner, broadcasting/film/video. She has a strong Holly Simmons and normally attends services on Sundays.   Functional and Nutritional State:  Holly Simmons is cared for by her daughter who moved in with her 3 1/2 years ago. Patients son and daughter take turns in caring for her and also have a home health aid that come in from time to time. She has been able to walk with a rollator. She has had a variable appetite for a number of years now.   Advance Directives:  A detailed discussion was had today regarding advanced directives.  Advanced directives are on file. Patients son, Holly Simmons is her Runner, broadcasting/film/video.   Code Status:  Concepts specific to code status, artifical feeding and hydration, continued IV antibiotics and rehospitalization was had.  The difference between a aggressive medical intervention path  and a palliative comfort care path for this patient at this time was had.   Holly Simmons is an established DNAR/DNI code status.   Provided  "Hard Choices for Loving People" booklet and a MOST form for review and completion.   Discussion:  We reviewed Holly Simmons's clinical decline over the past few years. We discussed her recent recurrent falls, decreased mentation, and varied PO intake.   We reviewed the circumstances leading to admission inclusive of patients delirium, loss of motor skills, and generalized fatigue starting on Monday. We reviewed the suspicion of dehydration and a UTI. We reviewed the treatment plan per progress note review.   Best case and worst case scenarios reviewed. Best case that Holly Simmons improved and can participate in therapies - getting back to  her BL. Worst case is that Holly Simmons continues to decline and has worsening renal function despite present efforts. Patient had never wanted hemodialysis her family.   We discussed should the worst case scenario be the reality the options from there inclusive of comfort care,  hospice at home, and possibly in patient hospice. Patients family are interested in speaking to [facility] to learn more about these options.   Discussed the importance of continued conversation with family and their  medical providers regarding overall plan of care and treatment options, ensuring decisions are within the context of the patients values and GOCs.  Decision Maker: Holly Simmons,Holly Simmons (Son): [phone] (Home Phone)   SUMMARY OF RECOMMENDATIONS   DNAR/DNI  Open and honest conversations held in the setting of patients disease burden  Best case and worst case scenarios reviewed  Plan to allow time for outcomes  Appreciate Hospice Liaison for HOP speaking to family  Ongoing PMT support  Code Status/Advance Care Planning: DNAR/DNI  Palliative Prophylaxis:  Aspiration, Bowel Regimen, Delirium Protocol, Frequent Pain Assessment, Oral Care, Palliative Wound Care, and Turn Reposition  Additional Recommendations (Limitations, Scope, Preferences): Continue current care  Psycho-social/Spiritual:  Desire for further Chaplaincy support: Yes - Christian Additional Recommendations: Education on dementia progression   Prognosis: Limited overall  Discharge Planning: Discharge plan to be determined.   Vitals:   11/18/23 0452 11/18/23 0839  BP: (!) 148/72 (!) 134/53  Pulse: 61 (!) 59  Resp: 19 18  Temp: 97.8 F (36.6 C) 98.2 F (36.8 C)  SpO2: 98% 91%   No intake or output data in the 24 hours ending 11/18/23 1130 Last Weight  Most recent update: 11/17/2023  8:12 PM    Weight  75.3 kg (166 lb)            Gen:  Chronically ill appearing Caucasian F in NAD HEENT: moist mucous membranes CV: Regular rate and rhythm  PULM: ON RA, breathing is even and nonlabored ABD: soft/nontender  EXT: No edema  Neuro: Alert and oriented x1  PPS: 30-40%   This conversation/these recommendations were discussed with patient primary care team, Dr.  Murray Arnold  Billing based on MDM: High ______________________________________________________ Camille [facility] Palliative Medicine Team Team Cell Phone: [phone] Please utilize secure chat with additional questions, if there is no response within 30 minutes please call the above phone number  Palliative Medicine Team providers are available by phone from 7am to 7pm daily and can be reached through the team cell phone.  Should this patient require assistance outside of these hours, please call the patient's attending physician.

## 2023-11-18 NOTE — Progress Notes (Signed)
 PHARMACY - ANTICOAGULATION CONSULT NOTE  Pharmacy Consult for Warfarin Indication: atrial fibrillation/Flutter  Allergies  Allergen Reactions   Petrolatum-Zinc Oxide Anaphylaxis, Rash and Swelling   Sulfa Antibiotics Rash   Sulfonamide Derivatives Anaphylaxis and Swelling   Tetanus Toxoids Swelling   Other Rash and Other (See Comments)    STERI - STRIPS  It can affect proteins in her kidneys so she doesn't take,  proteinuria   Nsaids Other (See Comments)    proteinuria, It can affect proteins in her kidneys so she doesn't take    Tetanus Toxoid, Adsorbed Other (See Comments)    Turned arm red    Patient Measurements: Weight: 75.3 kg (166 lb)  Vital Signs: Temp: 97.8 F (36.6 C) (04/16 0452) Temp Source: Oral (04/16 0452) BP: 148/72 (04/16 0452) Pulse Rate: 61 (04/16 0452)  Labs: Recent Labs    11/17/23 1132 11/17/23 1956 11/18/23 0638  HGB 10.5*  --  10.4*  HCT 33.8*  --  33.5*  PLT 113*  --  109*  LABPROT  --  25.2* 24.9*  INR  --  2.3* 2.2*  CREATININE 3.09*  --   --     Estimated Creatinine Clearance: 14.1 mL/min (A) (by C-G formula based on SCr of 3.09 mg/dL (H)).   Medical History: Past Medical History:  Diagnosis Date   Allergic rhinitis    Asthma    NOS w/ acute exacerbation   Blood transfusion without reported diagnosis    CKD (chronic kidney disease) stage 3, GFR 30-59 ml/min (HCC) 09/12/2018   Colon polyps    TUBULAR ADENOMAS AND HYPERPLASTIC   Complete heart block (HCC)    requiring PPM (MDT) post surgical myomectomy at [facility],  leads are epicardial with abdominal implant, high ventricular threshold at implant   COPD (chronic obstructive pulmonary disease) (HCC)    Depressive disorder    Diastolic dysfunction    DM (diabetes mellitus) (HCC)    Gallstones    GERD (gastroesophageal reflux disease)    Gout 08/20/2012   Heart murmur    Hyperpotassemia    Hypersomnia    Hypertension    Hypertrophic cardiomyopathy (HCC)    s/p surgical  myomectomy at [facility] 11/12 complicated by septal VSD post procedure requiring reoperation with patch closure and tricuspid valve replacement   Kidney stones    Myocardial infarction (HCC) 2011   Obstructive sleep apnea    persistent daytime sleepiness despite cpap   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   Psoriasis    Pyuria    Renal insufficiency    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 Patient with poor PO intake, weakness, fall with head strike. Patient is confused at baseline with dementia. INR on admit 2.3. Pharmacy consulted for warfarin.   PTA warfarin 1.25mg  MWF, 2.5mg  all other days  INR this am 2.2 (within goal range)  Goal of Therapy:  INR 2-3 Monitor platelets by anticoagulation protocol: Yes   Plan:   Warfarin 2.5mg  x 1 Monitor daily INR, CBC, signs/symptoms of bleeding   Jeanella Cara, PharmD, [facility] Clinical Pharmacist Please see AMION for all Pharmacists' Contact Phone Numbers 11/18/2023, 8:04 AM

## 2023-11-18 NOTE — Progress Notes (Signed)
 Physical Therapy Treatment Patient Details Name: Holly Simmons MRN: [id number] DOB: 1940-09-05 Today's Date: 11/18/2023   History of Present Illness Patient is a 83 yo female presenting to the ED with AMS and fall hitting her head on 11/17/23. CT of cervical spine and head clear. Awaiting culture for potential UTI. PMH significant of CKD stage IIIb, chronic HFpEF, hypertrophic cardiomyopathy status post myomectomy in 2012, CHB status post PPM, bioprosthetic TV, atrial fibrillation on Coumadin, hyperlipidemia, dementia, COPD, Recent fall 3/24 with LLE injury.    PT Comments  Pt received in recliner, tearful and moaning and calling out to staff, RN called to room and requesting PTA come to assist with transferring pt from chair back to bed. Pt needing up to +2 modA for sit>stand and min to modA for pivotal steps from chair>bed and standing from EOB<>RW. RN/MD notified pt c/o LLE cramping/pain and with observed LLE lateral wound near calf that pt caregiver states is from a fall on 3/24, top surface of wound appears black. Pt continues to benefit from PT services to progress toward functional mobility goals.     If plan is discharge home, recommend the following: A lot of help with walking and/or transfers;A lot of help with bathing/dressing/bathroom;Assist for transportation;Help with stairs or ramp for entrance;Supervision due to cognitive status;Assistance with cooking/housework;Direct supervision/assist for medications management;Direct supervision/assist for financial management   Can travel by private vehicle        Equipment Recommendations  BSC/3in1    Recommendations for Other Services       Precautions / Restrictions Precautions Precautions: Fall Recall of Precautions/Restrictions: Impaired Restrictions Weight Bearing Restrictions Per Provider Order: No     Mobility  Bed Mobility Overal bed mobility: Needs Assistance Bed Mobility: Sit to Supine       Sit to supine:  Min assist, +2 for physical assistance, HOB elevated, Used rails   General bed mobility comments: trunk and BLE assist    Transfers Overall transfer level: Needs assistance Equipment used: Rolling walker (2 wheels) Transfers: Sit to/from Stand Sit to Stand: Mod assist, +2 physical assistance, +2 safety/equipment           General transfer comment: from chair>RW +2 modA wtih increased time to place hands on RW, then from EOB<>RW with +1 modA to rise and RN assisting wtih peri-care in standing at RW.    Ambulation/Gait Ambulation/Gait assistance: Mod assist, +2 safety/equipment Gait Distance (Feet): 4 Feet (x2) Assistive device: Rolling walker (2 wheels) Gait Pattern/deviations: Step-to pattern, Decreased step length - right, Decreased step length - left, Shuffle, Trunk flexed Gait velocity: decr     General Gait Details: pivotal steps chair>EOB. seated break, then sidesteps ~50ft to [facility]. Pain too severe to walk further (pt c/o LLE pain)   Stairs             Wheelchair Mobility     Tilt Bed    Modified Rankin (Stroke Patients Only)       Balance Overall balance assessment: Needs assistance Sitting-balance support: Feet supported, Single extremity supported, No upper extremity supported Sitting balance-Leahy Scale: Fair   Postural control: Posterior lean Standing balance support: Bilateral upper extremity supported, During functional activity, Reliant on assistive device for balance Standing balance-Leahy Scale: Poor Standing balance comment: walker and min assist for static standing                            Communication Communication Communication: Impaired Factors Affecting Communication: Hearing  impaired  Cognition Arousal: Alert Behavior During Therapy: Anxious, Lability   PT - Cognitive impairments: Orientation, Awareness, Memory, Attention, Initiation, Sequencing, Problem solving, Safety/Judgement   Orientation impairments: Place,  Time, Situation                   PT - Cognition Comments: Pt tearful and anxious, unable to let her needs be known due to cognitive impairment; PTA called to room to assist RN with return transfer from chair>bed due to pt's Following commands: Impaired Following commands impaired: Follows one step commands with increased time    Cueing Cueing Techniques: Verbal cues, Gestural cues, Tactile cues  Exercises      General Comments General comments (skin integrity, edema, etc.): RN/MD notified of pt c/o LLE pain; wound on lateral mid-calf appears to have black scabbing or eschar on top, per daughter it occurred on 3/24 prior to admission.      Pertinent Vitals/Pain Pain Assessment Pain Assessment: PAINAD Breathing: occasional labored breathing, short period of hyperventilation Negative Vocalization: repeated troubled calling out, loud moaning/groaning, crying Facial Expression: facial grimacing Body Language: tense, distressed pacing, fidgeting Consolability: distracted or reassured by voice/touch PAINAD Score: 7 Pain Intervention(s): Limited activity within patient's tolerance, Monitored during session, Repositioned, Patient requesting pain meds-RN notified, Other (comment) (MD also notified pt mostly c/o pain in LLE around where a previous fall caused a wound and stating "it's cramping")    Home Living Family/patient expects to be discharged to:: Private residence Living Arrangements: Children Available Help at Discharge: Family;Personal care attendant;Available 24 hours/day (aide or children) Type of Home: House Home Access: Stairs to enter Entrance Stairs-Rails: None Entrance Stairs-Number of Steps: 1   Home Layout: Multi-level;Able to live on main level with bedroom/bathroom Home Equipment: Rollator (4 wheels);Shower seat;Shower seat - built in;Wheelchair - manual;Hand held shower head Additional Comments: 2nd shower chair in bathroom to sit and rest on; has access to  manual w/c    Prior Function            PT Goals (current goals can now be found in the care plan section) Acute Rehab PT Goals Patient Stated Goal: not stated PT Goal Formulation: With family Time For Goal Achievement: 12/02/23 Potential to Achieve Goals: Fair Progress towards PT goals: Progressing toward goals    Frequency    Min 2X/week      PT Plan      Co-evaluation PT/OT/SLP Co-Evaluation/Treatment: Yes Reason for Co-Treatment: Necessary to address cognition/behavior during functional activity;For patient/therapist safety PT goals addressed during session: Mobility/safety with mobility;Balance        AM-PAC PT "6 Clicks" Mobility   Outcome Measure  Help needed turning from your back to your side while in a flat bed without using bedrails?: A Lot Help needed moving from lying on your back to sitting on the side of a flat bed without using bedrails?: A Lot Help needed moving to and from a bed to a chair (including a wheelchair)?: A Lot Help needed standing up from a chair using your arms (e.g., wheelchair or bedside chair)?: A Lot Help needed to walk in hospital room?: Total Help needed climbing 3-5 steps with a railing? : Total 6 Click Score: 10    End of Session Equipment Utilized During Treatment: Gait belt Activity Tolerance: Patient limited by pain;Patient limited by fatigue Patient left: with call bell/phone within reach;in bed;with bed alarm set;Other (comment);with family/visitor present (pt heels floated for comfort (heels do not feel soft)) Nurse Communication: Mobility status;Patient requests pain  meds;Other (comment) (LLE pain and wound) PT Visit Diagnosis: Unsteadiness on feet (R26.81);Other abnormalities of gait and mobility (R26.89);Muscle weakness (generalized) (M62.81)     Time: 4098-1191 PT Time Calculation (min) (ACUTE ONLY): 25 min  Charges:    $Therapeutic Activity: 23-37 mins PT General Charges $$ ACUTE PT VISIT: 1 Visit                      Georganna Maxson P., PTA Acute Rehabilitation Services Secure Chat Preferred 9a-5:30pm Office: [phone]    Mariel Shope [facility] [employer] [facility] 11/18/2023, 5:27 PM

## 2023-11-19 DIAGNOSIS — R40244 Other coma, without documented Glasgow coma scale score, or with partial score reported, unspecified time: Secondary | ICD-10-CM | POA: Diagnosis not present

## 2023-11-19 DIAGNOSIS — G309 Alzheimer's disease, unspecified: Secondary | ICD-10-CM | POA: Diagnosis not present

## 2023-11-19 LAB — PROTIME-INR
INR: 2.2 — ABNORMAL HIGH (ref 0.8–1.2)
Prothrombin Time: 24.2 s — ABNORMAL HIGH (ref 11.4–15.2)

## 2023-11-19 LAB — BASIC METABOLIC PANEL WITH GFR
Anion gap: 11 (ref 5–15)
BUN: 32 mg/dL — ABNORMAL HIGH (ref 8–23)
CO2: 23 mmol/L (ref 22–32)
Calcium: 10.8 mg/dL — ABNORMAL HIGH (ref 8.9–10.3)
Chloride: 104 mmol/L (ref 98–111)
Creatinine, Ser: 1.78 mg/dL — ABNORMAL HIGH (ref 0.44–1.00)
GFR, Estimated: 29 mL/min — ABNORMAL LOW (ref >60)
Glucose, Bld: 125 mg/dL — ABNORMAL HIGH (ref 70–99)
Potassium: 4.4 mmol/L (ref 3.5–5.1)
Sodium: 138 mmol/L (ref 135–145)

## 2023-11-19 MED ORDER — SODIUM CHLORIDE 0.9 % IV SOLN: INTRAVENOUS | Status: DC

## 2023-11-19 MED ORDER — WARFARIN SODIUM 2.5 MG PO TABS
2.5000 mg | ORAL_TABLET | Freq: Once | ORAL | Status: AC
  Administered 2023-11-19: 2.5 mg via ORAL
  Filled 2023-11-19: qty 1

## 2023-11-19 MED ORDER — QUETIAPINE FUMARATE 25 MG PO TABS
12.5000 mg | ORAL_TABLET | Freq: Every day | ORAL | Status: DC
  Administered 2023-11-19: 12.5 mg via ORAL
  Filled 2023-11-19: qty 1

## 2023-11-19 MED ORDER — AMLODIPINE BESYLATE 5 MG PO TABS
5.0000 mg | ORAL_TABLET | Freq: Every day | ORAL | Status: DC
Start: 2023-11-19 — End: 2023-11-20
  Administered 2023-11-19 – 2023-11-20 (×2): 5 mg via ORAL
  Filled 2023-11-19 (×2): qty 1

## 2023-11-19 MED ORDER — OXYCODONE HCL 5 MG PO TABS: 5.0000 mg | ORAL_TABLET | Freq: Four times a day (QID) | ORAL | Status: DC | PRN

## 2023-11-19 NOTE — Progress Notes (Signed)
 PROGRESS NOTE  Holly Simmons  ZOX:[id number] DOB: [birth year] DOA: 11/17/2023 PCP: Arcadio Knuckles, MD   Brief Narrative:  Holly Simmons is a 83 y.o. female with medical history significant of advanced Alzheimer dementia, stroke, COPD, hypertension, hyperlipidemia, tricuspid valve replacement, CKD stage IIIb, GERD, depression, HFpEF, complete heart block status post pacemaker placement who was brought to the emergency department for further evaluation of worsening mentation with confusion, fall at home. Patient is confused at baseline. She also had very poor oral intake at home just having few bites at a time. She was also hallucinating.   Lab work showed creatinine of 3(baseline creatinine around 2).  Ammonia level less than 10.  skeletal survey did not show any fracture or dislocation.  CT cervical spine, CT head did not show any acute findings.  Suspected to have UTI, UA/urine culture sent.  Started on IV fluid, antibiotics.  Palliative  care following for goals of care.  Ongoing goals of care.Patient has severe dementia, poor oral intake.  Family interested in hospice services  Assessment & Plan:  Principal Problem:   AMS (altered mental status) Active Problems:   Gout   Stage 3b chronic kidney disease (CKD) (HCC)   Chronic heart failure with preserved ejection fraction (HFpEF) (HCC)   Long term (current) use of anticoagulants   Hyperlipidemia LDL goal <130   Dementia (HCC)   COPD (chronic obstructive pulmonary disease) (HCC)   At risk for falls   AKI (acute kidney injury) (HCC)  Altered mental status on the background of dementia: Patient has history of dementia but has been more confused from baseline.  This could be secondary to AKI or underlying UTI, or could be simply the progression of dementia.Aaron Aas  UA suspicious for UTI, started on ceftriaxone.  Urine culture pending Home medications Lamictal, bupropion on hold   AKI in CKD stage IIIb: Baseline creatinine around 2.   Presented with creatinine of 3.  Likely dehydrated.  Improved with iv fluid   HFpEF: Appeared dehydrated.  Home torsemide at home.  Currently on gentle IV.   History of advanced dementia: Follows with neurology.  Recently had EEG as an outpatient on 3/24 which did not show any seizure but showed diffuse cerebral dysfunction.   Hyperlipidemia: Takes statin.  Resumed   History of tricuspid valve replacement/HOCM, aortic stenosis: Follows with cardiology .  Echocardiogram showed mild to moderate aortic stenosis , EF of 50 to 55%, grade 2 diastolic dysfunction  history of COPD: Continue home inhaler.  Not in an exacerbation.  Continue bronchodilators as needed   Frequent fall/deconditioning/weakness: Lives with family.  Usually ambulates with help of walker.  Currently has been very weak.  PT/OT consulted   History of hypertension: Home metoprolol resumed.Added amlod   History of paroxysmal A-fib/complete heart block: Status post pacemaker placement.  Follows with cardiology.  Takes Coumadin for anticoagulation.  Currently in normal sinus rhythm   H/O gout : Continue allopurinol  Insomnia.: Continue  Melatonin   Goals of care: CODE STATUS DNR.  Goals of care discussed with son and daughter.  They are interested to discuss about hospice options.  Palliative care consulted and following for goals of care        DVT prophylaxis: warfarin (COUMADIN) tablet 2.5 mg     Code Status: Limited: Do not attempt resuscitation (DNR) -DNR-LIMITED -Do Not Intubate/DNI   Family Communication: Daughter and son at bedside  Patient status:Inpatient  Patient is from :home  Anticipated discharge WJ:XBJY vs residential hospice  Estimated DC date:1-2 days   Consultants: Palliative care  Procedures:None  Antimicrobials:  Anti-infectives (From admission, onward)    Start     Dose/Rate Route Frequency Ordered Stop   11/18/23 0930  cefTRIAXone (ROCEPHIN) 1 g in sodium chloride 0.9 % 100 mL IVPB         1 g 200 mL/hr over 30 Minutes Intravenous Every 24 hours 11/18/23 0839 11/14/2023 0929       Subjective: Patient seen and examined at bedside today.  Lying in bed.  Confused.  Not agitated.  Continues to have poor oral intake.  Daughter and son at bedside.  They are interested to explore about hospice options.  Home hospice is not an option for them.  Objective: Vitals:   11/18/23 0839 11/18/23 2043 11/19/23 0336 11/19/23 0737  BP: (!) 134/53 (!) 151/69 (!) 150/68 (!) 152/119  Pulse: (!) 59 66 64 62  Resp: 18 18 20 17   Temp: 98.2 F (36.8 C) 97.8 F (36.6 C) 98.3 F (36.8 C) 97.7 F (36.5 C)  TempSrc: Oral Oral Oral Oral  SpO2: 91% 100% 95% 97%  Weight:        Intake/Output Summary (Last 24 hours) at 11/19/2023 1042 Last data filed at 11/19/2023 0548 Gross per 24 hour  Intake 2580.1 ml  Output 800 ml  Net 1780.1 ml   Filed Weights   11/17/23 2007  Weight: 75.3 kg    Examination:   General exam: Overall comfortable, not in distress, very deconditioned, weak, lying in bed, confused HEENT: PERRL Respiratory system:  no wheezes or crackles  Cardiovascular system: S1 & S2 heard, RRR.  Gastrointestinal system: Abdomen is nondistended, soft and nontender. Central nervous system: Not alert or oriented Extremities: No edema, no clubbing ,no cyanosis Skin: No rashes, no ulcers,no icterus     Data Reviewed: I have personally reviewed following labs and imaging studies  CBC: Recent Labs  Lab 11/17/23 1132 11/18/23 0638  WBC 6.0 7.7  NEUTROABS 4.4  --   HGB 10.5* 10.4*  HCT 33.8* 33.5*  MCV 98.5 99.4  PLT 113* 109*   Basic Metabolic Panel: Recent Labs  Lab 11/17/23 1132 11/18/23 0638 11/19/23 0543  NA 139 140 138  K 4.3 4.5 4.4  CL 102 103 104  CO2 29 27 23   GLUCOSE 116* 116* 125*  BUN 49* 43* 32*  CREATININE 3.09* 2.36* 1.78*  CALCIUM 10.7* 11.0* 10.8*     Recent Results (from the past 240 hours)  Urine Culture     Status: None (Preliminary  result)   Collection Time: 11/17/23  3:21 PM   Specimen: In/Out Cath Urine  Result Value Ref Range Status   Specimen Description IN/OUT CATH URINE  Final   Special Requests NONE  Final   Culture   Final    CULTURE REINCUBATED FOR BETTER GROWTH Performed at [facility], [address], [place], [place] [address]    Report Status PENDING  Incomplete     Radiology Studies: ECHOCARDIOGRAM COMPLETE Result Date: 11/18/2023    ECHOCARDIOGRAM REPORT   Patient Name:   REGENA DELUCCHI Date of Exam: 11/18/2023 Medical Rec #:  [id number]            Height:       63.0 in Accession #:    [id number]           Weight:       166.0 lb Date of Birth:  July 14, 1941  BSA:          1.786 m Patient Age:    82 years             BP:           132/53 mmHg Patient Gender: F                    HR:           60 bpm. Exam Location:  Inpatient Procedure: 2D Echo, Color Doppler and Cardiac Doppler (Both Spectral and Color            Flow Doppler were utilized during procedure). Indications:    R55 Syncope  History:        Patient has prior history of Echocardiogram examinations, most                 recent 03/19/2022. COPD; Risk Factors:Hypertension, Diabetes,                 Dyslipidemia and Sleep Apnea. [facility] at [facility] in 2011                 with subsequent VSD patch repair and tricuspid valve                 replacement.  Sonographer:    Sherline Distel Senior RDCS Referring Phys: [phone] Xzavier Swinger IMPRESSIONS  1. Septal myectomy in 2011 with subsequent VSD repair, there appears to be no residual VSD. Left ventricular ejection fraction, by estimation, is 50 to 55%. The left ventricle has low normal function. There is mild asymmetric left ventricular hypertrophy of the infero-lateral segment. Left ventricular diastolic parameters are consistent with Grade II diastolic dysfunction (pseudonormalization).  2. TAPSE and S' underestimated in the setting of tricuspid replacement, RV function is normal by visual  estimation and RVOT VTI of 16.1cm. Right ventricular systolic function is normal. The right ventricular size is normal. There is moderately elevated pulmonary artery systolic pressure. The estimated right ventricular systolic pressure is 46.1 mmHg.  3. Left atrial size was moderately dilated.  4. Right atrial size was moderately dilated.  5. The mitral valve is abnormal. Mild mitral valve regurgitation. Moderate mitral annular calcification.  6. The tricuspid valve has been replaced in 2011 with a 27mm Mosaic Bioprosthetic valve, which demonstrates severe stenosis with a mean gradient of . The tricuspid valve is has been repaired/replaced. Tricuspid valve regurgitation is moderate. Severe tricuspid stenosis.  7. Aortic valve DVI of 0.30 suggests more moderate disease. The aortic valve is calcified. Aortic valve regurgitation is mild. Mild to moderate aortic valve stenosis. Aortic valve mean gradient measures 19.0 mmHg. Aortic valve Vmax measures 2.83 m/s.  8. The inferior vena cava is dilated in size with <50% respiratory variability, suggesting right atrial pressure of 15 mmHg. FINDINGS  Left Ventricle: Septal myectomy in 2011 with subsequent VSD repair, there appears to be no residual VSD. Left ventricular ejection fraction, by estimation, is 50 to 55%. The left ventricle has low normal function. The left ventricle has no regional wall  motion abnormalities. The left ventricular internal cavity size was normal in size. There is mild asymmetric left ventricular hypertrophy of the infero-lateral segment. Abnormal (paradoxical) septal motion, consistent with RV pacemaker. Left ventricular  diastolic parameters are consistent with Grade II diastolic dysfunction (pseudonormalization). The ratio of pulmonic flow to systemic flow (Qp/Qs ratio) is 2.00. Right Ventricle: TAPSE and S' underestimated in the setting of tricuspid replacement, RV function is normal by visual  estimation and RVOT VTI of 16.1cm. The right  ventricular size is normal. No increase in right ventricular wall thickness. Right ventricular systolic function is normal. There is moderately elevated pulmonary artery systolic pressure. The tricuspid regurgitant velocity is 2.79 m/s, and with an assumed right atrial pressure of 15 mmHg, the estimated right ventricular systolic pressure is 46.1 mmHg. Left Atrium: Left atrial size was moderately dilated. Right Atrium: Right atrial size was moderately dilated. Pericardium: There is no evidence of pericardial effusion. Presence of epicardial fat layer. Mitral Valve: The mitral valve is abnormal. Moderate mitral annular calcification. Mild mitral valve regurgitation. MV peak gradient, 10.0 mmHg. The mean mitral valve gradient is 3.0 mmHg. Tricuspid Valve: The tricuspid valve has been replaced in 2011 with a 27mm Mosaic Bioprosthetic valve, which demonstrates severe stenosis with a mean gradient of . The tricuspid valve is has been repaired/replaced. Tricuspid valve regurgitation is moderate . Severe tricuspid stenosis. Aortic Valve: Aortic valve DVI of 0.30 suggests more moderate disease. The aortic valve is calcified. Aortic valve regurgitation is mild. Mild to moderate aortic stenosis is present. Aortic valve mean gradient measures 19.0 mmHg. Aortic valve peak gradient measures 32.0 mmHg. Aortic valve area, by VTI measures 0.93 cm. Pulmonic Valve: The pulmonic valve was normal in structure. Pulmonic valve regurgitation is not visualized. Aorta: The aortic root and ascending aorta are structurally normal, with no evidence of dilitation. Venous: The inferior vena cava is dilated in size with less than 50% respiratory variability, suggesting right atrial pressure of 15 mmHg. IAS/Shunts: The atrial septum is grossly normal. The ratio of pulmonic flow to systemic flow (Qp/Qs ratio) is 2.00.  LEFT VENTRICLE PLAX 2D LVIDd:         5.00 cm LVIDs:         3.50 cm LV PW:         1.00 cm LV IVS:        0.80 cm LVOT  diam:     2.00 cm LV SV:         60 LV SV Index:   34 LVOT Area:     3.14 cm  RIGHT VENTRICLE RV S prime:     6.64 cm/s RVOT diam:      2.65 cm TAPSE (M-mode): 1.6 cm LEFT ATRIUM             Index        RIGHT ATRIUM           Index LA diam:        4.30 cm 2.41 cm/m   RA Area:     22.00 cm LA Vol (A2C):   85.0 ml 47.58 ml/m  RA Volume:   66.50 ml  37.23 ml/m LA Vol (A4C):   71.1 ml 39.80 ml/m LA Biplane Vol: 77.8 ml 43.55 ml/m  AORTIC VALVE                     PULMONIC VALVE AV Area (Vmax):    1.00 cm      RVOT Peak grad: 2 mmHg AV Area (Vmean):   0.92 cm AV Area (VTI):     0.93 cm AV Vmax:           283.00 cm/s AV Vmean:          213.000 cm/s AV VTI:            0.642 m AV Peak Grad:      32.0 mmHg AV Mean Grad:  19.0 mmHg LVOT Vmax:         90.30 cm/s LVOT Vmean:        62.100 cm/s LVOT VTI:          0.191 m LVOT/AV VTI ratio: 0.30  AORTA Ao Root diam: 2.80 cm Ao Asc diam:  2.70 cm MITRAL VALVE                TRICUSPID VALVE MV Area (PHT): 3.43 cm     TV Peak grad:   14.1 mmHg MV Peak grad:  10.0 mmHg    TV Mean grad:   11.0 mmHg MV Mean grad:  3.0 mmHg     TV Vmax:        1.88 m/s MV Vmax:       1.58 m/s     TV Vmean:       164.5 cm/s MV Vmean:      82.8 cm/s    TV VTI:         1.00 msec MV Decel Time: 221 msec     TR Peak grad:   31.1 mmHg MV E velocity: 143.00 cm/s  TR Vmax:        279.00 cm/s MV A velocity: 81.60 cm/s MV E/A ratio:  1.75         SHUNTS                             Systemic VTI:  0.19 m                             Systemic Diam: 2.00 cm                             Pulmonic VTI:  0.169 m                             Pulmonic Diam: 2.65 cm                             Qp/Qs:         1.56 Arta Lark Electronically signed by Arta Lark Signature Date/Time: 11/18/2023/3:26:06 PM    Final    CT Head Wo Contrast Result Date: 11/17/2023 CLINICAL DATA:  Altered mental status, fall yesterday, swelling to back of head. On anticoagulation. EXAM: CT HEAD WITHOUT CONTRAST CT CERVICAL  SPINE WITHOUT CONTRAST TECHNIQUE: Multidetector CT imaging of the head and cervical spine was performed following the standard protocol without intravenous contrast. Multiplanar CT image reconstructions of the cervical spine were also generated. RADIATION DOSE REDUCTION: This exam was performed according to the departmental dose-optimization program which includes automated exposure control, adjustment of the mA and/or kV according to patient size and/or use of iterative reconstruction technique. COMPARISON:  CT head 10/15/2023, CT cervical spine 07/12/2020. FINDINGS: CT HEAD FINDINGS Brain: No acute intracranial hemorrhage. No CT evidence of acute infarct. Nonspecific hypoattenuation in the periventricular and subcortical white matter favored to reflect chronic microvascular ischemic changes. No edema, mass effect, or midline shift. The basilar cisterns are patent. Ventricles: The ventricles are normal. Vascular: Atherosclerotic calcifications of the carotid siphons. No hyperdense vessel. Skull: No acute or aggressive finding. Orbits: Orbits are symmetric. Sinuses: The visualized paranasal sinuses are clear. Other: Mastoid air cells are clear.  CT CERVICAL SPINE FINDINGS Alignment: Straightening and slight reversal of the normal cervical lordosis. Trace anterolisthesis of C3 on C4 and trace retrolisthesis of C5 on C6. No facet subluxation or dislocation. Skull base and vertebrae: No acute fracture. No primary bone lesion or focal pathologic process. Soft tissues and spinal canal: No prevertebral fluid or swelling. No visible canal hematoma. Disc levels: Intervertebral disc space narrowing most pronounced at C5-6. Small disc bulges at multiple levels. No high-grade osseous spinal canal stenosis. Facet arthrosis throughout the cervical spine. Foraminal narrowing most pronounced on the left at C3-4 and C4-5. Upper chest: No acute abnormality of the lung apices. Mildly patulous upper thoracic esophagus. Other: None.  IMPRESSION: No CT evidence of acute intracranial abnormality. No acute fracture or traumatic malalignment of the cervical spine. Chronic and degenerative changes as above. Electronically Signed   By: Emily Filbert M.D.   On: 11/17/2023 15:03   CT Cervical Spine Wo Contrast Result Date: 11/17/2023 CLINICAL DATA:  Altered mental status, fall yesterday, swelling to back of head. On anticoagulation. EXAM: CT HEAD WITHOUT CONTRAST CT CERVICAL SPINE WITHOUT CONTRAST TECHNIQUE: Multidetector CT imaging of the head and cervical spine was performed following the standard protocol without intravenous contrast. Multiplanar CT image reconstructions of the cervical spine were also generated. RADIATION DOSE REDUCTION: This exam was performed according to the departmental dose-optimization program which includes automated exposure control, adjustment of the mA and/or kV according to patient size and/or use of iterative reconstruction technique. COMPARISON:  CT head 10/15/2023, CT cervical spine 07/12/2020. FINDINGS: CT HEAD FINDINGS Brain: No acute intracranial hemorrhage. No CT evidence of acute infarct. Nonspecific hypoattenuation in the periventricular and subcortical white matter favored to reflect chronic microvascular ischemic changes. No edema, mass effect, or midline shift. The basilar cisterns are patent. Ventricles: The ventricles are normal. Vascular: Atherosclerotic calcifications of the carotid siphons. No hyperdense vessel. Skull: No acute or aggressive finding. Orbits: Orbits are symmetric. Sinuses: The visualized paranasal sinuses are clear. Other: Mastoid air cells are clear. CT CERVICAL SPINE FINDINGS Alignment: Straightening and slight reversal of the normal cervical lordosis. Trace anterolisthesis of C3 on C4 and trace retrolisthesis of C5 on C6. No facet subluxation or dislocation. Skull base and vertebrae: No acute fracture. No primary bone lesion or focal pathologic process. Soft tissues and spinal canal:  No prevertebral fluid or swelling. No visible canal hematoma. Disc levels: Intervertebral disc space narrowing most pronounced at C5-6. Small disc bulges at multiple levels. No high-grade osseous spinal canal stenosis. Facet arthrosis throughout the cervical spine. Foraminal narrowing most pronounced on the left at C3-4 and C4-5. Upper chest: No acute abnormality of the lung apices. Mildly patulous upper thoracic esophagus. Other: None. IMPRESSION: No CT evidence of acute intracranial abnormality. No acute fracture or traumatic malalignment of the cervical spine. Chronic and degenerative changes as above. Electronically Signed   By: Emily Filbert M.D.   On: 11/17/2023 15:03   DG Pelvis Portable Result Date: 11/17/2023 CLINICAL DATA:  Fall. EXAM: PORTABLE PELVIS 1-2 VIEWS COMPARISON:  CT abdomen/pelvis dated August 28, 2023. FINDINGS: There is no evidence of acute fracture or diastasis. Femoral heads are seated within the acetabula. Sacroiliac joints and pubic symphysis are anatomically aligned. IMPRESSION: No acute osseous abnormality identified. Electronically Signed   By: Hart Robinsons M.D.   On: 11/17/2023 13:59   DG Chest Portable 1 View Result Date: 11/17/2023 CLINICAL DATA:  Pain after fall EXAM: PORTABLE CHEST 1 VIEW COMPARISON:  Chest x-ray 07/27/2023 and older FINDINGS: Enlarged cardiopericardial  silhouette with slight prominence of the central vasculature. Calcified aorta. Sternal wires. Left upper chest battery pack with pacemaker leads along the heart. There are separate defibrillator leads overlying the cardiac shadow and extending caudal off the edge of the imaging field. Slight linear opacity left midlung likely scar or atelectasis is stable. No pneumothorax, effusion or consolidation. Degenerative changes of the spine and shoulders. Overall if there is further concern of the sequela of trauma additional workup as clinically appropriate. IMPRESSION: Postop chest. Pacemaker. Separate  defibrillator leads. Enlarged cardiac shadow. Left midlung scar or atelectasis. Electronically Signed   By: Adrianna Horde M.D.   On: 11/17/2023 13:52    Scheduled Meds:  allopurinol  100 mg Oral Daily   amLODipine  5 mg Oral Daily   brexpiprazole  0.25 mg Oral q AM   famotidine  10 mg Oral Daily   ferrous sulfate  325 mg Oral Q breakfast   fluticasone furoate-vilanterol  1 puff Inhalation Daily   lamoTRIgine  25 mg Oral Q supper   linaclotide  72 mcg Oral QAC breakfast   melatonin  3 mg Oral QHS   metoprolol tartrate  25 mg Oral BID   pravastatin  40 mg Oral q1800   warfarin  2.5 mg Oral ONCE-1600   Warfarin - Pharmacist Dosing Inpatient   Does not apply q1600   Continuous Infusions:  cefTRIAXone (ROCEPHIN)  IV 1 g (11/19/23 0803)     LOS: 0 days   Leona Rake, MD [facility] P4/17/2025, 10:42 AM

## 2023-11-19 NOTE — Progress Notes (Signed)
   11/19/23 1753  Mobility  Activity Turned to right side;Turned to left side;Turned to back - supine  Level of Assistance Maximum assist, patient does 25-49% (+2)  Assistive Device None  Activity Response Tolerated fair  Mobility Referral Yes  Mobility visit 1 Mobility  Mobility Specialist Start Time (ACUTE ONLY) 1738  Mobility Specialist Stop Time (ACUTE ONLY) 1753  Mobility Specialist Time Calculation (min) (ACUTE ONLY) 15 min   Mobility Specialist: Progress Note  MS assisted NT with pericare. Pt agreeable to mobility session - received in bed. C/o L knee pain. Pt with sleepiness throughout, responded when name was called. Pt would laugh and converse throughout with staff in and out of sleep. Returned to sitting upright in bed with all needs met - call bell within reach. Bed alarm on.   Holly Simmons, BS Mobility Specialist Please contact via SecureChat or  Rehab office at [phone].

## 2023-11-19 NOTE — Care Management (Signed)
 Transition of Care [facility]) - Inpatient Brief Assessment   Patient Details  Name: Holly Simmons MRN: [id number] Date of Birth: 16-Sep-1940  [facility] CM/SW Contact:    Ronni Colace, RN Phone Number: 11/19/2023, 3:03 PM   Clinical Narrative: 83 year old patient presented with AKI increased confusion. Palliative has been consulted. She lives with her daughter and her son helps as well. She has had a decline over the last couple of months in her eating, activity.  The family wants to see how she responds this admission as far as hospice is concerned. Her renal function is improving from admission with treatment modalities.  Her discharge plan then is Home Health and palliative care, versus home hospice.  TOC will follow for needs, recommendations, and transitions of care   Transition of Care Asessment: Insurance and Status: Insurance coverage has been reviewed Patient has primary care physician: Yes Home environment has been reviewed: Lives with daughter [facility]   Prior/Current Home Services: Current home services (Has aide that comes to help) Social Drivers of Health Review: SDOH reviewed no interventions necessary Readmission risk has been reviewed: Yes Transition of care needs: transition of care needs identified, TOC will continue to follow

## 2023-11-19 NOTE — Progress Notes (Signed)
 PHARMACY - ANTICOAGULATION CONSULT NOTE  Pharmacy Consult for Warfarin Indication: atrial fibrillation/Flutter  Allergies  Allergen Reactions   Petrolatum-Zinc Oxide Anaphylaxis, Rash and Swelling   Sulfa Antibiotics Rash   Sulfonamide Derivatives Anaphylaxis and Swelling   Tetanus Toxoids Swelling   Other Rash and Other (See Comments)    STERI - STRIPS  It can affect proteins in her kidneys so she doesn't take,  proteinuria   Nsaids Other (See Comments)    proteinuria, It can affect proteins in her kidneys so she doesn't take    Tetanus Toxoid, Adsorbed Other (See Comments)    Turned arm red    Patient Measurements: Weight: 75.3 kg (166 lb)  Vital Signs: Temp: 97.7 F (36.5 C) (04/17 0737) Temp Source: Oral (04/17 0737) BP: 152/119 (04/17 0737) Pulse Rate: 62 (04/17 0737)  Labs: Recent Labs    11/17/23 1132 11/17/23 1956 11/18/23 0638 11/19/23 0543  HGB 10.5*  --  10.4*  --   HCT 33.8*  --  33.5*  --   PLT 113*  --  109*  --   LABPROT  --  25.2* 24.9* 24.2*  INR  --  2.3* 2.2* 2.2*  CREATININE 3.09*  --  2.36* 1.78*    Estimated Creatinine Clearance: 24.5 mL/min (A) (by C-G formula based on SCr of 1.78 mg/dL (H)).   Medical History: Past Medical History:  Diagnosis Date   Allergic rhinitis    Asthma    NOS w/ acute exacerbation   Blood transfusion without reported diagnosis    CKD (chronic kidney disease) stage 3, GFR 30-59 ml/min (HCC) 09/12/2018   Colon polyps    TUBULAR ADENOMAS AND HYPERPLASTIC   Complete heart block (HCC)    requiring PPM (MDT) post surgical myomectomy at [facility],  leads are epicardial with abdominal implant, high ventricular threshold at implant   COPD (chronic obstructive pulmonary disease) (HCC)    Depressive disorder    Diastolic dysfunction    DM (diabetes mellitus) (HCC)    Gallstones    GERD (gastroesophageal reflux disease)    Gout 08/20/2012   Heart murmur    Hyperpotassemia    Hypersomnia    Hypertension     Hypertrophic cardiomyopathy (HCC)    s/p surgical myomectomy at [facility] 11/12 complicated by septal VSD post procedure requiring reoperation with patch closure and tricuspid valve replacement   Kidney stones    Myocardial infarction (HCC) 2011   Obstructive sleep apnea    persistent daytime sleepiness despite cpap   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   Psoriasis    Pyuria    Renal insufficiency    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 Patient with poor PO intake, weakness, fall with head strike. Patient is confused at baseline with dementia. INR on admit 2.3. Pharmacy consulted for warfarin.   PTA warfarin 1.25mg  MWF, 2.5mg  all other days  INR this am 2.2 (within goal range)  Goal of Therapy:  INR 2-3 Monitor platelets by anticoagulation protocol: Yes   Plan:   Warfarin 2.5mg  x 1 Monitor daily INR, CBC, signs/symptoms of bleeding   [name] A. Jeanella Craze, PharmD, BCPS, [facility] Clinical Pharmacist [place] Please utilize Amion for appropriate phone number to reach the unit pharmacist [facility])  11/19/2023, 8:13 AM

## 2023-11-20 DIAGNOSIS — N289 Disorder of kidney and ureter, unspecified: Secondary | ICD-10-CM

## 2023-11-20 DIAGNOSIS — W19XXXA Unspecified fall, initial encounter: Secondary | ICD-10-CM

## 2023-11-20 DIAGNOSIS — G309 Alzheimer's disease, unspecified: Secondary | ICD-10-CM

## 2023-11-20 DIAGNOSIS — S0990XA Unspecified injury of head, initial encounter: Secondary | ICD-10-CM | POA: Diagnosis not present

## 2023-11-20 DIAGNOSIS — Z7189 Other specified counseling: Secondary | ICD-10-CM | POA: Diagnosis not present

## 2023-11-20 DIAGNOSIS — Z66 Do not resuscitate: Secondary | ICD-10-CM | POA: Diagnosis not present

## 2023-11-20 DIAGNOSIS — R40244 Other coma, without documented Glasgow coma scale score, or with partial score reported, unspecified time: Secondary | ICD-10-CM

## 2023-11-20 DIAGNOSIS — F0280 Dementia in other diseases classified elsewhere without behavioral disturbance: Secondary | ICD-10-CM

## 2023-11-20 DIAGNOSIS — Z789 Other specified health status: Secondary | ICD-10-CM

## 2023-11-20 DIAGNOSIS — R410 Disorientation, unspecified: Secondary | ICD-10-CM | POA: Diagnosis not present

## 2023-11-20 DIAGNOSIS — Z515 Encounter for palliative care: Secondary | ICD-10-CM | POA: Diagnosis not present

## 2023-11-20 DIAGNOSIS — R451 Restlessness and agitation: Secondary | ICD-10-CM

## 2023-11-20 LAB — PROTIME-INR
INR: 2.4 — ABNORMAL HIGH (ref 0.8–1.2)
Prothrombin Time: 26.1 s — ABNORMAL HIGH (ref 11.4–15.2)

## 2023-11-20 MED ORDER — WARFARIN 1.25 MG HALF TABLET: 1.2500 mg | ORAL_TABLET | Freq: Once | ORAL | Status: DC

## 2023-11-20 MED ORDER — LORAZEPAM 2 MG/ML IJ SOLN
0.2500 mg | Freq: Once | INTRAMUSCULAR | Status: AC
  Administered 2023-11-20: 0.25 mg via INTRAVENOUS
  Filled 2023-11-20: qty 1

## 2023-11-20 MED ORDER — POLYVINYL ALCOHOL 1.4 % OP SOLN: 1.0000 [drp] | Freq: Four times a day (QID) | OPHTHALMIC | Status: DC | PRN

## 2023-11-20 MED ORDER — HALOPERIDOL LACTATE 5 MG/ML IJ SOLN: 2.0000 mg | Freq: Four times a day (QID) | INTRAMUSCULAR | Status: DC | PRN

## 2023-11-20 MED ORDER — LORAZEPAM 2 MG/ML IJ SOLN: 1.0000 mg | INTRAMUSCULAR | Status: DC | PRN

## 2023-11-20 MED ORDER — BIOTENE DRY MOUTH MT LIQD
15.0000 mL | Freq: Two times a day (BID) | OROMUCOSAL | Status: DC
  Administered 2023-11-20: 15 mL via TOPICAL

## 2023-11-20 MED ORDER — HALOPERIDOL LACTATE 5 MG/ML IJ SOLN
1.0000 mg | Freq: Once | INTRAMUSCULAR | Status: AC
  Administered 2023-11-20: 1 mg via INTRAVENOUS
  Filled 2023-11-20: qty 1

## 2023-11-20 MED ORDER — ACETAMINOPHEN 650 MG RE SUPP: 650.0000 mg | Freq: Four times a day (QID) | RECTAL | Status: DC | PRN

## 2023-11-20 MED ORDER — HALOPERIDOL LACTATE 5 MG/ML IJ SOLN
3.0000 mg | Freq: Four times a day (QID) | INTRAMUSCULAR | Status: DC | PRN
Start: 2023-11-20 — End: 2023-11-20
  Administered 2023-11-20: 3 mg via INTRAVENOUS
  Filled 2023-11-20: qty 1

## 2023-11-20 MED ORDER — LORAZEPAM 2 MG/ML IJ SOLN
1.0000 mg | INTRAMUSCULAR | Status: DC
  Administered 2023-11-20 (×2): 1 mg via INTRAVENOUS
  Filled 2023-11-20 (×3): qty 1

## 2023-11-20 MED ORDER — ONDANSETRON HCL 4 MG/2ML IJ SOLN: 4.0000 mg | Freq: Four times a day (QID) | INTRAMUSCULAR | Status: DC | PRN

## 2023-11-20 MED ORDER — GLYCOPYRROLATE 1 MG PO TABS: 1.0000 mg | ORAL_TABLET | ORAL | Status: DC | PRN

## 2023-11-20 MED ORDER — QUETIAPINE FUMARATE 25 MG PO TABS: 25.0000 mg | ORAL_TABLET | Freq: Every day | ORAL | Status: DC

## 2023-11-20 MED ORDER — ACETAMINOPHEN 325 MG PO TABS: 650.0000 mg | ORAL_TABLET | Freq: Four times a day (QID) | ORAL | Status: DC | PRN

## 2023-11-20 MED ORDER — DIPHENHYDRAMINE HCL 50 MG/ML IJ SOLN: 25.0000 mg | INTRAMUSCULAR | Status: DC | PRN

## 2023-11-20 MED ORDER — LORAZEPAM 2 MG/ML PO CONC: 1.0000 mg | ORAL | Status: DC | PRN

## 2023-11-20 MED ORDER — ONDANSETRON 4 MG PO TBDP
4.0000 mg | ORAL_TABLET | Freq: Four times a day (QID) | ORAL | Status: DC | PRN
Start: 2023-11-20 — End: 2023-11-21

## 2023-11-20 MED ORDER — HALOPERIDOL 1 MG PO TABS: 2.0000 mg | ORAL_TABLET | Freq: Four times a day (QID) | ORAL | Status: DC | PRN

## 2023-11-20 MED ORDER — HALOPERIDOL LACTATE 2 MG/ML PO CONC: 2.0000 mg | Freq: Four times a day (QID) | ORAL | Status: DC | PRN

## 2023-11-20 MED ORDER — LORAZEPAM 2 MG/ML IJ SOLN
2.0000 mg | Freq: Once | INTRAMUSCULAR | Status: AC
  Administered 2023-11-20: 2 mg via INTRAVENOUS

## 2023-11-20 MED ORDER — LORAZEPAM 1 MG PO TABS: 1.0000 mg | ORAL_TABLET | ORAL | Status: DC | PRN

## 2023-11-20 MED ORDER — GLYCOPYRROLATE 0.2 MG/ML IJ SOLN: 0.2000 mg | INTRAMUSCULAR | Status: DC | PRN

## 2023-11-20 MED ORDER — HYDROMORPHONE HCL 1 MG/ML IJ SOLN: 1.0000 mg | INTRAMUSCULAR | Status: DC | PRN

## 2023-11-20 NOTE — Progress Notes (Signed)
 PT Cancellation Note  Patient Details Name: Holly Simmons MRN: [id number] DOB: [birth year]   Cancelled Treatment:    Reason Eval/Treat Not Completed: (P) Other (comment) (PTA and OT speaking with family, and family reports they plan on comfort care/hospice for now. MD to cancel PT order and therapies will respectfully sign off at this time; it was a pleasure to be a part of [name]'s care team.)   Connell HERO [facility] 11/20/2023, 11:32 AM

## 2023-11-20 NOTE — Progress Notes (Signed)
 1738- Called and gave report to Hadassah, CHARITY FUNDRAISER. All questions were answered. Paperwork is all together and printed. Pt has no personal belongings at bedside.   Psychosocial Progressive/Outcome: ANOx1, calm and cooperative at times Family at bedside   Pain/Comfort Progression/Outcome: Pt did not show signs of pain; only agitation   Clinical Progression/Outcome: Poor diet intake  Bilat safety mittens on  Incon x2 Had BM Slept in between care Maintained safety  Sitter at bedside   D/C plan to: hospice

## 2023-11-20 NOTE — Progress Notes (Signed)
 PROGRESS NOTE  Holly Simmons  WNU:[id number] DOB: [birth year] DOA: 11/17/2023 PCP: Arcadio Knuckles, MD   Brief Narrative:  Holly Simmons is a 83 y.o. female with medical history significant of advanced Alzheimer dementia, stroke, COPD, hypertension, hyperlipidemia, tricuspid valve replacement, CKD stage IIIb, GERD, depression, HFpEF, complete heart block status post pacemaker placement who was brought to the emergency department for further evaluation of worsening mentation with confusion, fall at home. Patient is confused at baseline. She also had very poor oral intake at home just having few bites at a time. She was also hallucinating.   Lab work showed creatinine of 3(baseline creatinine around 2).  Ammonia level less than 10.  skeletal survey did not show any fracture or dislocation.  CT cervical spine, CT head did not show any acute findings.  Suspected to have UTI, UA/urine culture sent.  Started on IV fluid, antibiotics.  Palliative  care following for goals of care. Family interested in residential hospice services.  Likely will be transitioned to comfort care today.  Assessment & Plan:  Principal Problem:   AMS (altered mental status) Active Problems:   Gout   Stage 3b chronic kidney disease (CKD) (HCC)   Chronic heart failure with preserved ejection fraction (HFpEF) (HCC)   Long term (current) use of anticoagulants   Hyperlipidemia LDL goal <130   Dementia (HCC)   COPD (chronic obstructive pulmonary disease) (HCC)   At risk for falls   AKI (acute kidney injury) (HCC)  Altered mental status on the background of dementia: Patient has history of dementia but has been more confused from baseline.  This could be secondary to AKI or underlying UTI, or could be simply the progression of dementia.Aaron Aas  UA suspicious for UTI, started on ceftriaxone .  Urine culture has not shown any growth yet.  Had to be given Haldol  this morning due to persistent agitation.  Also on Seroquel  at  bedtime.   AKI in CKD stage IIIb: Baseline creatinine around 2.  Presented with creatinine of 3.  Likely dehydrated.  Improved with iv fluid   HFpEF: Appeared dehydrated.  Home torsemide  at home.  Currently on gentle IV.   History of advanced dementia: Follows with neurology.  Recently had EEG as an outpatient on 3/24 which did not show any seizure but showed diffuse cerebral dysfunction.   Hyperlipidemia: Takes statin.  Resumed   History of tricuspid valve replacement/HOCM, aortic stenosis: Follows with cardiology .  Echocardiogram showed mild to moderate aortic stenosis , EF of 50 to 55%, grade 2 diastolic dysfunction  history of COPD: Continue home inhaler.  Not in an exacerbation.  Continue bronchodilators as needed   Frequent fall/deconditioning/weakness: Lives with family.  Usually ambulates with help of walker.  Currently has been very weak.  PT/OT consulted   History of hypertension: Home metoprolol  resumed.Added amlod   History of paroxysmal A-fib/complete heart block: Status post pacemaker placement.  Follows with cardiology.  Takes Coumadin  for anticoagulation.  Currently in normal sinus rhythm   H/O gout : Continue allopurinol   Insomnia.: Continue  Melatonin   Goals of care: CODE STATUS DNR.  Goals of care discussed with son and daughter.  They are interested to discuss about hospice options.  Palliative care consulted and following for goals of care.        DVT prophylaxis: warfarin (COUMADIN ) tablet 1.25 mg     Code Status: Limited: Do not attempt resuscitation (DNR) -DNR-LIMITED -Do Not Intubate/DNI   Family Communication: Daughter and son at bedside  Patient status:Inpatient  Patient is from :home  Anticipated discharge to: residential hospice  Estimated DC date:1-2 days   Consultants: Palliative care  Procedures:None  Antimicrobials:  Anti-infectives (From admission, onward)    Start     Dose/Rate Route Frequency Ordered Stop   11/18/23 0930   cefTRIAXone  (ROCEPHIN ) 1 g in sodium chloride  0.9 % 100 mL IVPB        1 g 200 mL/hr over 30 Minutes Intravenous Every 24 hours 11/18/23 0839 11/20/23 1610       Subjective: Patient seen and examined at the bedside today.  Was agitated this morning, had to be given Haldol .  During evaluation, she was somnolent.  Daughter and son at bedside.  Long discussion had at bedside.  They are very emotional, tearful.  They want her mom not to suffer anymore.  They are interested in residential hospice/comfort care  Objective: Vitals:   11/19/23 1135 11/19/23 1620 11/19/23 2003 11/20/23 0748  BP: (!) 142/61 138/63 (!) 125/49 (!) 151/60  Pulse: 61 63 60 67  Resp: 18 20 17 18   Temp: 98.2 F (36.8 C) 98.1 F (36.7 C) 97.9 F (36.6 C) 98.1 F (36.7 C)  TempSrc: Oral Oral Axillary Axillary  SpO2: 98% 96% 93%   Weight:        Intake/Output Summary (Last 24 hours) at 11/20/2023 1016 Last data filed at 11/20/2023 0600 Gross per 24 hour  Intake 672.89 ml  Output 406 ml  Net 266.89 ml   Filed Weights   11/17/23 2007  Weight: 75.3 kg    Examination:   General exam: Lying on bed, on restraints, somnolent HEENT: eyes closed Respiratory system:  no wheezes or crackles  Cardiovascular system: S1 & S2 heard, RRR.  Gastrointestinal system: Abdomen is nondistended, soft and nontender. Central nervous system: Not alert or oriented Extremities: No edema, no clubbing ,no cyanosis Skin: scattered bruises, no ulcers,no icterus     Data Reviewed: I have personally reviewed following labs and imaging studies  CBC: Recent Labs  Lab 11/17/23 1132 11/18/23 0638  WBC 6.0 7.7  NEUTROABS 4.4  --   HGB 10.5* 10.4*  HCT 33.8* 33.5*  MCV 98.5 99.4  PLT 113* 109*   Basic Metabolic Panel: Recent Labs  Lab 11/17/23 1132 11/18/23 0638 11/19/23 0543  NA 139 140 138  K 4.3 4.5 4.4  CL 102 103 104  CO2 29 27 23   GLUCOSE 116* 116* 125*  BUN 49* 43* 32*  CREATININE 3.09* 2.36* 1.78*  CALCIUM   10.7* 11.0* 10.8*     Recent Results (from the past 240 hours)  Urine Culture     Status: None (Preliminary result)   Collection Time: 11/17/23  3:21 PM   Specimen: In/Out Cath Urine  Result Value Ref Range Status   Specimen Description IN/OUT CATH URINE  Final   Special Requests NONE  Final   Culture   Final    CULTURE REINCUBATED FOR BETTER GROWTH Performed at [facility], [address], [place], [place] [address]    Report Status PENDING  Incomplete     Radiology Studies: ECHOCARDIOGRAM COMPLETE Result Date: 11/18/2023    ECHOCARDIOGRAM REPORT   Patient Name:   STEFANNIE DEFEO Date of Exam: 11/18/2023 Medical Rec #:  [id number]            Height:       63.0 in Accession #:    [id number]           Weight:  166.0 lb Date of Birth:  06/07/1941           BSA:          1.786 m Patient Age:    82 years             BP:           132/53 mmHg Patient Gender: F                    HR:           60 bpm. Exam Location:  Inpatient Procedure: 2D Echo, Color Doppler and Cardiac Doppler (Both Spectral and Color            Flow Doppler were utilized during procedure). Indications:    R55 Syncope  History:        Patient has prior history of Echocardiogram examinations, most                 recent 03/19/2022. COPD; Risk Factors:Hypertension, Diabetes,                 Dyslipidemia and Sleep Apnea. Septal myectomy at [facility] in 2011                 with subsequent VSD patch repair and tricuspid valve                 replacement.  Sonographer:    Sherline Distel Senior RDCS Referring Phys: [phone] Kannan Proia IMPRESSIONS  1. Septal myectomy in 2011 with subsequent VSD repair, there appears to be no residual VSD. Left ventricular ejection fraction, by estimation, is 50 to 55%. The left ventricle has low normal function. There is mild asymmetric left ventricular hypertrophy of the infero-lateral segment. Left ventricular diastolic parameters are consistent with Grade II diastolic dysfunction  (pseudonormalization).  2. TAPSE and S' underestimated in the setting of tricuspid replacement, RV function is normal by visual estimation and RVOT VTI of 16.1cm. Right ventricular systolic function is normal. The right ventricular size is normal. There is moderately elevated pulmonary artery systolic pressure. The estimated right ventricular systolic pressure is 46.1 mmHg.  3. Left atrial size was moderately dilated.  4. Right atrial size was moderately dilated.  5. The mitral valve is abnormal. Mild mitral valve regurgitation. Moderate mitral annular calcification.  6. The tricuspid valve has been replaced in 2011 with a 27mm Mosaic Bioprosthetic valve, which demonstrates severe stenosis with a mean gradient of . The tricuspid valve is has been repaired/replaced. Tricuspid valve regurgitation is moderate. Severe tricuspid stenosis.  7. Aortic valve DVI of 0.30 suggests more moderate disease. The aortic valve is calcified. Aortic valve regurgitation is mild. Mild to moderate aortic valve stenosis. Aortic valve mean gradient measures 19.0 mmHg. Aortic valve Vmax measures 2.83 m/s.  8. The inferior vena cava is dilated in size with <50% respiratory variability, suggesting right atrial pressure of 15 mmHg. FINDINGS  Left Ventricle: Septal myectomy in 2011 with subsequent VSD repair, there appears to be no residual VSD. Left ventricular ejection fraction, by estimation, is 50 to 55%. The left ventricle has low normal function. The left ventricle has no regional wall  motion abnormalities. The left ventricular internal cavity size was normal in size. There is mild asymmetric left ventricular hypertrophy of the infero-lateral segment. Abnormal (paradoxical) septal motion, consistent with RV pacemaker. Left ventricular  diastolic parameters are consistent with Grade II diastolic dysfunction (pseudonormalization). The ratio of pulmonic flow to systemic flow (Qp/Qs ratio) is 2.00. Right  Ventricle: TAPSE and S'  underestimated in the setting of tricuspid replacement, RV function is normal by visual estimation and RVOT VTI of 16.1cm. The right ventricular size is normal. No increase in right ventricular wall thickness. Right ventricular systolic function is normal. There is moderately elevated pulmonary artery systolic pressure. The tricuspid regurgitant velocity is 2.79 m/s, and with an assumed right atrial pressure of 15 mmHg, the estimated right ventricular systolic pressure is 46.1 mmHg. Left Atrium: Left atrial size was moderately dilated. Right Atrium: Right atrial size was moderately dilated. Pericardium: There is no evidence of pericardial effusion. Presence of epicardial fat layer. Mitral Valve: The mitral valve is abnormal. Moderate mitral annular calcification. Mild mitral valve regurgitation. MV peak gradient, 10.0 mmHg. The mean mitral valve gradient is 3.0 mmHg. Tricuspid Valve: The tricuspid valve has been replaced in 2011 with a 27mm Mosaic Bioprosthetic valve, which demonstrates severe stenosis with a mean gradient of . The tricuspid valve is has been repaired/replaced. Tricuspid valve regurgitation is moderate . Severe tricuspid stenosis. Aortic Valve: Aortic valve DVI of 0.30 suggests more moderate disease. The aortic valve is calcified. Aortic valve regurgitation is mild. Mild to moderate aortic stenosis is present. Aortic valve mean gradient measures 19.0 mmHg. Aortic valve peak gradient measures 32.0 mmHg. Aortic valve area, by VTI measures 0.93 cm. Pulmonic Valve: The pulmonic valve was normal in structure. Pulmonic valve regurgitation is not visualized. Aorta: The aortic root and ascending aorta are structurally normal, with no evidence of dilitation. Venous: The inferior vena cava is dilated in size with less than 50% respiratory variability, suggesting right atrial pressure of 15 mmHg. IAS/Shunts: The atrial septum is grossly normal. The ratio of pulmonic flow to systemic flow (Qp/Qs ratio)  is 2.00.  LEFT VENTRICLE PLAX 2D LVIDd:         5.00 cm LVIDs:         3.50 cm LV PW:         1.00 cm LV IVS:        0.80 cm LVOT diam:     2.00 cm LV SV:         60 LV SV Index:   34 LVOT Area:     3.14 cm  RIGHT VENTRICLE RV S prime:     6.64 cm/s RVOT diam:      2.65 cm TAPSE (M-mode): 1.6 cm LEFT ATRIUM             Index        RIGHT ATRIUM           Index LA diam:        4.30 cm 2.41 cm/m   RA Area:     22.00 cm LA Vol (A2C):   85.0 ml 47.58 ml/m  RA Volume:   66.50 ml  37.23 ml/m LA Vol (A4C):   71.1 ml 39.80 ml/m LA Biplane Vol: 77.8 ml 43.55 ml/m  AORTIC VALVE                     PULMONIC VALVE AV Area (Vmax):    1.00 cm      RVOT Peak grad: 2 mmHg AV Area (Vmean):   0.92 cm AV Area (VTI):     0.93 cm AV Vmax:           283.00 cm/s AV Vmean:          213.000 cm/s AV VTI:            0.642 m  AV Peak Grad:      32.0 mmHg AV Mean Grad:      19.0 mmHg LVOT Vmax:         90.30 cm/s LVOT Vmean:        62.100 cm/s LVOT VTI:          0.191 m LVOT/AV VTI ratio: 0.30  AORTA Ao Root diam: 2.80 cm Ao Asc diam:  2.70 cm MITRAL VALVE                TRICUSPID VALVE MV Area (PHT): 3.43 cm     TV Peak grad:   14.1 mmHg MV Peak grad:  10.0 mmHg    TV Mean grad:   11.0 mmHg MV Mean grad:  3.0 mmHg     TV Vmax:        1.88 m/s MV Vmax:       1.58 m/s     TV Vmean:       164.5 cm/s MV Vmean:      82.8 cm/s    TV VTI:         1.00 msec MV Decel Time: 221 msec     TR Peak grad:   31.1 mmHg MV E velocity: 143.00 cm/s  TR Vmax:        279.00 cm/s MV A velocity: 81.60 cm/s MV E/A ratio:  1.75         SHUNTS                             Systemic VTI:  0.19 m                             Systemic Diam: 2.00 cm                             Pulmonic VTI:  0.169 m                             Pulmonic Diam: 2.65 cm                             Qp/Qs:         1.56 Arta Lark Electronically signed by Arta Lark Signature Date/Time: 11/18/2023/3:26:06 PM    Final     Scheduled Meds:  allopurinol   100 mg Oral Daily    amLODipine   5 mg Oral Daily   brexpiprazole   0.25 mg Oral q AM   famotidine   10 mg Oral Daily   ferrous sulfate   325 mg Oral Q breakfast   fluticasone  furoate-vilanterol  1 puff Inhalation Daily   lamoTRIgine   25 mg Oral Q supper   linaclotide   72 mcg Oral QAC breakfast   metoprolol  tartrate  25 mg Oral BID   pravastatin   40 mg Oral q1800   QUEtiapine   25 mg Oral QHS   warfarin  1.25 mg Oral ONCE-1600   Warfarin - Pharmacist Dosing Inpatient   Does not apply q1600   Continuous Infusions:  sodium chloride  50 mL/hr at 11/19/23 1646     LOS: 0 days   Leona Rake, MD [facility] P4/18/2025, 10:16 AM

## 2023-11-20 NOTE — Progress Notes (Signed)
 Daily Progress Note   Patient Name: Holly Simmons       Date: 11/20/2023 DOB: 1941-04-16  Age: 83 y.o. MRN#: [id number] Attending Physician: Leona Rake, MD Primary Care Physician: Arcadio Knuckles, MD Admit Date: 11/17/2023  Reason for Consultation/Follow-up: Establishing goals of care  Subjective: I have reviewed medical records including EPIC notes, MAR, any available advanced directives as necessary, and labs. Received report from primary RN - no acute concerns. Per documentation and RN report, patient was very restless and agitated overnight/this morning requiring haldol  and 1:1 Recruitment consultant.  Went to visit patient at bedside - daughter/Kendra and son/Russell present. Patient was lying in bed awake with eyes closed - she is restless and yelling out with mitts in place. She does not meaningfully respond to her name or questions. No respiratory distress, increased work of breathing, or secretions noted.   Family are understandably tearful seeing patient in this state. Offered to move to conference room. Met with son and daughter in 2W conference room. Emotional support provided.   Therapeutic listening provided as family reflect on patient's interval history since admission. They tell me "she started doing this Wednesday." We discussed the topic of terminal agitation/delirium in context of her underlying dementia.   Family reflect on when their father passed away and how his EOL trajectory is much different than hers.   We talked about transition to comfort measures in house and what that would entail inclusive of medications to control pain, dyspnea, agitation, nausea, and itching. We discussed stopping all unnecessary measures such as blood draws, needle sticks, oxygen, antibiotics,  CBGs/insulin , cardiac monitoring, IVF, and frequent vital signs. Education provided that other non-pharmacological interventions would be utilized for holistic support and comfort such as spiritual support if requested, repositioning, music therapy, offering comfort feeds, and/or therapeutic listening. All care would focus on how the patient is looking and feeling. We discussed why stopping IVF is recommended per family request.  Prognosis reviewed. Answered questions regarding PPM management at EOL.  Visit also consisted of discussions dealing with the complex and emotionally intense issues of symptom management and palliative care in the setting of serious and potentially life-threatening illness.   Family opt for patient's transition to full comfort measures today. They are most interested in her discharge to hospice facility - requesting either [facility] with  [facility] or [facility]. Whichever facility has the first open bed, they will accept.   All questions and concerns addressed. Encouraged to call with questions and/or concerns. PMT card provided.  3:07 PM Notified by [facility] liaison that patient has been approved and [facility] has an open bed today.   Length of Stay: 0  Current Medications: Scheduled Meds:   allopurinol   100 mg Oral Daily   amLODipine   5 mg Oral Daily   brexpiprazole   0.25 mg Oral q AM   famotidine   10 mg Oral Daily   ferrous sulfate   325 mg Oral Q breakfast   fluticasone  furoate-vilanterol  1 puff Inhalation Daily   lamoTRIgine   25 mg Oral Q supper   linaclotide   72 mcg Oral QAC breakfast   metoprolol  tartrate  25 mg Oral BID   pravastatin   40 mg Oral q1800   QUEtiapine   25 mg Oral QHS   warfarin  1.25 mg Oral ONCE-1600   Warfarin - Pharmacist Dosing Inpatient   Does not apply q1600    Continuous Infusions:  sodium chloride  50 mL/hr at 11/19/23 1646    PRN Meds: acetaminophen , haloperidol  lactate,  ipratropium-albuterol , oxyCODONE   Physical Exam Vitals and nursing note reviewed.  Constitutional:      General: She is not in acute distress.    Appearance: She is ill-appearing.  Pulmonary:     Effort: No respiratory distress.  Skin:    General: Skin is warm and dry.  Neurological:     Mental Status: She is disoriented and confused.     Motor: Weakness present.  Psychiatric:        Mood and Affect: Mood is anxious.        Behavior: Behavior is agitated.        Cognition and Memory: Cognition is impaired. Memory is impaired.        Judgment: Judgment is impulsive.             Vital Signs: BP (!) 151/60 (BP Location: Left Arm)   Pulse 67   Temp 98.1 F (36.7 C) (Axillary)   Resp 18   Wt 75.3 kg   SpO2 93%   BMI 29.41 kg/m  SpO2: SpO2: 93 % O2 Device: O2 Device: Room Air O2 Flow Rate:    Intake/output summary:  Intake/Output Summary (Last 24 hours) at 11/20/2023 1033 Last data filed at 11/20/2023 0600 Gross per 24 hour  Intake 672.89 ml  Output 406 ml  Net 266.89 ml   LBM: Last BM Date : 11/18/23 Baseline Weight: Weight: 75.3 kg Most recent weight: Weight: 75.3 kg       Palliative Assessment/Data: PPS 10%      Patient Active Problem List   Diagnosis Date Noted   AMS (altered mental status) 11/17/2023   AKI (acute kidney injury) (HCC) 11/17/2023   Balance problem 10/28/2023   Multiple contusions 10/28/2023   At risk for falls 10/28/2023   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) 10/13/2023   Ataxia after head trauma 10/13/2023   Head trauma 10/13/2023   Pancreatic mass 09/07/2023   Pelvic pain in female 08/25/2023   Groin pain, right 08/18/2023   Hemorrhoids 08/18/2023   Acute seborrheic dermatitis 07/27/2023   Yeast dermatitis 07/27/2023   Subacute cough 07/27/2023   Delirium due to another medical condition 05/27/2023   Type 2 diabetes mellitus with stage 4 chronic kidney disease, without long-term current use of insulin  (HCC) 05/27/2023  COPD (chronic obstructive pulmonary disease) (HCC) 05/16/2023   Hyperglycemia 05/16/2023   Hypercalcemia 05/16/2023   Dementia (HCC) 09/02/2022   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   Severe episode of recurrent major depressive disorder, without psychotic features (HCC) 02/26/2022   A-fib (HCC) 08/31/2021   Thrombocytopenia (HCC) 12/04/2020   Encounter for general adult medical examination with abnormal findings 07/12/2020   Cachexia (HCC) 08/16/2019   Iron deficiency anemia due to chronic blood loss 09/23/2018   Stage 3b chronic kidney disease (CKD) (HCC) 09/12/2018   Microalbuminuria due to type 2 diabetes mellitus (HCC) 04/21/2018   Obesity (BMI 30.0-34.9) 02/05/2016   Osteoporosis with pathological fracture of ankle and foot 05/30/2015   Encounter for monitoring SBE (subacute bacterial endocarditis) prophylaxis 11/22/2014   Gout 08/20/2012   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 06/10/2012   Other screening mammogram 02/04/2012   Hyperlipidemia LDL goal <130 12/04/2011   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   Long term (current) use of anticoagulants 07/18/2011   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   Hypersomnia 08/16/2009   Chronic heart failure with preserved ejection fraction (HFpEF) (HCC) 08/25/2008   GERD 08/10/2008   Obstructive sleep apnea 08/10/2007   Allergic rhinitis 08/09/2007    Palliative Care Assessment & Plan   Patient Profile: Per intake H&P -->  Holly Simmons is a 83 y.o. female with medical history significant of advanced Alzheimer dementia, stroke, COPD, hypertension, hyperlipidemia, tricuspid valve replacement, CKD stage IIIb, GERD, depression, HFpEF, complete heart block status post pacemaker placement who was brought to the emergency department for further evaluation of worsening mentation with confusion, fall at home.   Assessment: Principal Problem:   AMS  (altered mental status) Active Problems:   Chronic heart failure with preserved ejection fraction (HFpEF) (HCC)   Long term (current) use of anticoagulants   Hyperlipidemia LDL goal <130   Gout   Stage 3b chronic kidney disease (CKD) (HCC)   Dementia (HCC)   COPD (chronic obstructive pulmonary disease) (HCC)   At risk for falls   AKI (acute kidney injury) (HCC)   Terminal care  Recommendations/Plan: Initiated full comfort measures Continue DNR/DNI as previously documented Transfer to [facility] today Added orders for EOL symptom management and to reflect full comfort measures, as well as discontinued orders that were not focused on comfort Unrestricted visitation orders were placed per current [place] EOL visitation policy  Nursing to provide frequent assessments and administer PRN medications as clinically necessary to ensure EOL comfort PMT will continue to follow and support holistically  Symptom Management Dilaudid  PRN pain/dyspnea/increased work of breathing/RR>25 Tylenol  PRN pain/fever Biotin twice daily Benadryl  PRN itching Robinul  PRN secretions Haldol  PRN agitation/delirium Scheduled ativan  q4h; PRN doses for breakthrough anxiety/seizure/sleep/distress Zofran  PRN nausea/vomiting Liquifilm Tears PRN dry eye Continue Seroquel  while tolerating POs   Goals of Care and Additional Recommendations: Limitations on Scope of Treatment: Full Comfort Care  Code Status:    Code Status Orders  (From admission, onward)           Start     Ordered   11/17/23 1636  Do not attempt resuscitation (DNR)- Limited -Do Not Intubate (DNI)  (Code Status)  Continuous       Question Answer Comment  If pulseless and not breathing No CPR or chest compressions.   In Pre-Arrest Conditions (Patient Is Breathing and Has A Pulse) Do not intubate. Provide all appropriate non-invasive medical interventions. Avoid ICU transfer unless indicated or required.   Consent:  Discussion  documented in EHR or advanced directives reviewed      11/17/23 1635           Code Status History     Date Active Date Inactive Code Status Order ID Comments User Context   11/17/2023 1557 11/17/2023 1635 Full Code [id number]  Leona Rake, MD ED   05/15/2023 2353 05/18/2023 1924 Full Code [id number]  Juliette Oh, MD ED   03/18/2022 2008 03/21/2022 2135 Full Code [id number]  Macdonald Savoy, MD ED   09/05/2021 2325 09/08/2021 1721 DNR [id number]  Kenny Peals, MD Inpatient   05/03/2017 2047 05/08/2017 2025 Full Code [id number]  Albertus Alt, MD ED   07/13/2012 1053 07/14/2012 1336 Full Code [id number]  Annalee Barren, RN Inpatient       Prognosis:  < 2 weeks  Discharge Planning: Hospice facility  Care plan was discussed with primary RN, patient's family, TOC, hospice liaisons, Dr. Murray Arnold  Thank you for allowing the Palliative Medicine Team to assist in the care of this patient.   Total Time 70 minutes Prolonged Time Billed  yes       Annette Killings, NP  Please contact Palliative Medicine Team phone at [phone] for questions and concerns.   *Portions of this note are a verbal dictation therefore any spelling and/or grammatical errors are due to the "[employer]agon [employer] One" system interpretation.

## 2023-11-20 NOTE — Plan of Care (Signed)

## 2023-11-20 NOTE — Progress Notes (Signed)
 PHARMACY - ANTICOAGULATION CONSULT NOTE  Pharmacy Consult for Warfarin Indication: atrial fibrillation/Flutter  Allergies  Allergen Reactions   Petrolatum-Zinc  Oxide Anaphylaxis, Rash and Swelling   Sulfa Antibiotics Rash   Sulfonamide Derivatives Anaphylaxis and Swelling   Tetanus Toxoids Swelling   Other Rash and Other (See Comments)    STERI - STRIPS  It can affect proteins in her kidneys so she doesn't take,  proteinuria   Nsaids Other (See Comments)    proteinuria, It can affect proteins in her kidneys so she doesn't take    Tetanus Toxoid, Adsorbed Other (See Comments)    Turned arm red    Patient Measurements: Weight: 75.3 kg (166 lb)  Vital Signs: Temp: 97.9 F (36.6 C) (04/17 2003) Temp Source: Axillary (04/17 2003) BP: 125/49 (04/17 2003) Pulse Rate: 60 (04/17 2003)  Labs: Recent Labs    11/17/23 1132 11/17/23 1956 11/18/23 0638 11/19/23 0543 11/20/23 0538  HGB 10.5*  --  10.4*  --   --   HCT 33.8*  --  33.5*  --   --   PLT 113*  --  109*  --   --   LABPROT  --    < > 24.9* 24.2* 26.1*  INR  --    < > 2.2* 2.2* 2.4*  CREATININE 3.09*  --  2.36* 1.78*  --    < > = values in this interval not displayed.    Estimated Creatinine Clearance: 24.5 mL/min (A) (by C-G formula based on SCr of 1.78 mg/dL (H)).   Medical History: Past Medical History:  Diagnosis Date   Allergic rhinitis    Asthma    NOS w/ acute exacerbation   Blood transfusion without reported diagnosis    CKD (chronic kidney disease) stage 3, GFR 30-59 ml/min (HCC) 09/12/2018   Colon polyps    TUBULAR ADENOMAS AND HYPERPLASTIC   Complete heart block (HCC)    requiring PPM (MDT) post surgical myomectomy at [facility],  leads are epicardial with abdominal implant, high ventricular threshold at implant   COPD (chronic obstructive pulmonary disease) (HCC)    Depressive disorder    Diastolic dysfunction    DM (diabetes mellitus) (HCC)    Gallstones    GERD (gastroesophageal reflux disease)     Gout 08/20/2012   Heart murmur    Hyperpotassemia    Hypersomnia    Hypertension    Hypertrophic cardiomyopathy (HCC)    s/p surgical myomectomy at [facility] 11/12 complicated by septal VSD post procedure requiring reoperation with patch closure and tricuspid valve replacement   Kidney stones    Myocardial infarction (HCC) 2011   Obstructive sleep apnea    persistent daytime sleepiness despite cpap   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   Psoriasis    Pyuria    Renal insufficiency    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 Patient with poor PO intake, weakness, fall with head strike. Patient is confused at baseline with dementia. INR on admit 2.3. Pharmacy consulted for warfarin.   PTA warfarin 1.25mg  MWF, 2.5mg  all other days  INR this am 2.4 (within goal range)  Goal of Therapy:  INR 2-3 Monitor platelets by anticoagulation protocol: Yes   Plan:   Warfarin 1.25mg  x 1 Monitor daily INR, CBC, signs/symptoms of bleeding   Lovina Ruddle, PharmD, [facility] Clinical Pharmacist Please see AMION for all Pharmacists' Contact  Phone Numbers 11/20/2023, 7:17 AM

## 2023-11-20 NOTE — Care Management (Signed)
 [facility] called

## 2023-11-20 NOTE — Discharge Summary (Signed)
 Physician Discharge Summary  Holly Simmons QMV:[id number] DOB: 06/28/1941 DOA: 11/17/2023  PCP: Arcadio Knuckles, MD  Admit date: 11/17/2023 Discharge date: 11/20/2023  Admitted From: Home Disposition:  residential Hospice  Discharge Condition:Stable CODE STATUS:Comfort care Diet recommendation:  Regular  Brief/Interim Summary: Holly Simmons is a 83 y.o. female with medical history significant of advanced Alzheimer dementia, stroke, COPD, hypertension, hyperlipidemia, tricuspid valve replacement, CKD stage IIIb, GERD, depression, HFpEF, complete heart block status post pacemaker placement who was brought to the emergency department for further evaluation of worsening mentation with confusion, fall at home. Patient is confused at baseline. She also had very poor oral intake at home just having few bites at a time. She was also hallucinating.   Lab work showed creatinine of 3(baseline creatinine around 2).  Ammonia level less than 10.  skeletal survey did not show any fracture or dislocation.  CT cervical spine, CT head did not show any acute findings.  Suspected to have UTI, UA/urine culture sent but never resulted . She was on IV fluid, antibiotics.  Palliative  care were following for goals of care.  Hospital course notable for worsening agitation, profound delirium, poor oral intake.Family interested in residential hospice services.  Now she has been transitioned to  comfort care .  Plan for transfer to residential  hospice whenever possible     Discharge Diagnoses:  Principal Problem:   AMS (altered mental status) Active Problems:   Gout   Stage 3b chronic kidney disease (CKD) (HCC)   Chronic heart failure with preserved ejection fraction (HFpEF) (HCC)   Long term (current) use of anticoagulants   Hyperlipidemia LDL goal <130   Dementia (HCC)   COPD (chronic obstructive pulmonary disease) (HCC)   At risk for falls   AKI (acute kidney injury) [facility])    Discharge  Instructions  Discharge Instructions     Diet general   Complete by: As directed       Allergies as of 11/20/2023       Reactions   Petrolatum-zinc  Oxide Anaphylaxis, Rash, Swelling   Sulfa Antibiotics Rash   Sulfonamide Derivatives Anaphylaxis, Swelling   Tetanus Toxoids Swelling   Other Rash, Other (See Comments)   STERI - STRIPS  It can affect proteins in her kidneys so she doesn't take,  proteinuria   Nsaids Other (See Comments)   proteinuria, It can affect proteins in her kidneys so she doesn't take   Tetanus Toxoid, Adsorbed Other (See Comments)   Turned arm red        Medication List     STOP taking these medications    acetaminophen  500 MG tablet Commonly known as: TYLENOL    allopurinol  100 MG tablet Commonly known as: ZYLOPRIM    Betamethasone  Valerate 0.12 % foam   buPROPion  300 MG 24 hr tablet Commonly known as: WELLBUTRIN  XL   famotidine  40 MG tablet Commonly known as: PEPCID    ferrous sulfate  325 (65 FE) MG tablet   hydrocortisone  25 MG suppository Commonly known as: ANUSOL -HC   ipratropium 0.03 % nasal spray Commonly known as: ATROVENT    ketoconazole  2 % cream Commonly known as: NIZORAL    lamoTRIgine  25 MG tablet Commonly known as: LAMICTAL    Linzess  72 MCG capsule Generic drug: linaclotide    metoprolol  tartrate 25 MG tablet Commonly known as: LOPRESSOR    Pitavastatin  Calcium  2 MG Tabs   PRESCRIPTION MEDICATION   Rexulti  0.25 MG Tabs tablet Generic drug: brexpiprazole    Symbicort  80-4.5 MCG/ACT inhaler Generic drug: budesonide -formoterol    SYSTANE  BALANCE OP   torsemide  20 MG tablet Commonly known as: DEMADEX    triamcinolone  ointment 0.1 % Commonly known as: KENALOG    Vilazodone  HCl 40 MG Tabs Commonly known as: VIIBRYD    warfarin 2.5 MG tablet Commonly known as: COUMADIN         Follow-up Information     Arcadio Knuckles, MD.   Specialty: Internal Medicine Contact information: [address] [place]  [place] [address] [phone]                Allergies  Allergen Reactions   Petrolatum-Zinc  Oxide Anaphylaxis, Rash and Swelling   Sulfa Antibiotics Rash   Sulfonamide Derivatives Anaphylaxis and Swelling   Tetanus Toxoids Swelling   Other Rash and Other (See Comments)    STERI - STRIPS  It can affect proteins in her kidneys so she doesn't take,  proteinuria   Nsaids Other (See Comments)    proteinuria, It can affect proteins in her kidneys so she doesn't take    Tetanus Toxoid, Adsorbed Other (See Comments)    Turned arm red    Consultations: Palliative care   Procedures/Studies: ECHOCARDIOGRAM COMPLETE Result Date: 11/18/2023    ECHOCARDIOGRAM REPORT   Patient Name:   Holly Simmons Date of Exam: 11/18/2023 Medical Rec #:  [id number]            Height:       63.0 in Accession #:    [id number]           Weight:       166.0 lb Date of Birth:  [birth year]           BSA:          1.786 m Patient Age:    82 years             BP:           132/53 mmHg Patient Gender: F                    HR:           60 bpm. Exam Location:  Inpatient Procedure: 2D Echo, Color Doppler and Cardiac Doppler (Both Spectral and Color            Flow Doppler were utilized during procedure). Indications:    R55 Syncope  History:        Patient has prior history of Echocardiogram examinations, most                 recent 03/19/2022. COPD; Risk Factors:Hypertension, Diabetes,                 Dyslipidemia and Sleep Apnea. Septal myectomy at [facility] in 2011                 with subsequent VSD patch repair and tricuspid valve                 replacement.  Sonographer:    Sherline Distel Senior RDCS Referring Phys: [phone] Julya Alioto IMPRESSIONS  1. Septal myectomy in 2011 with subsequent VSD repair, there appears to be no residual VSD. Left ventricular ejection fraction, by estimation, is 50 to 55%. The left ventricle has low normal function. There is mild asymmetric left ventricular hypertrophy of the infero-lateral segment.  Left ventricular diastolic parameters are consistent with Grade II diastolic dysfunction (pseudonormalization).  2. TAPSE and S' underestimated in the setting of tricuspid replacement, RV function is normal by visual estimation and RVOT VTI of  16.1cm. Right ventricular systolic function is normal. The right ventricular size is normal. There is moderately elevated pulmonary artery systolic pressure. The estimated right ventricular systolic pressure is 46.1 mmHg.  3. Left atrial size was moderately dilated.  4. Right atrial size was moderately dilated.  5. The mitral valve is abnormal. Mild mitral valve regurgitation. Moderate mitral annular calcification.  6. The tricuspid valve has been replaced in 2011 with a 27mm Mosaic Bioprosthetic valve, which demonstrates severe stenosis with a mean gradient of . The tricuspid valve is has been repaired/replaced. Tricuspid valve regurgitation is moderate. Severe tricuspid stenosis.  7. Aortic valve DVI of 0.30 suggests more moderate disease. The aortic valve is calcified. Aortic valve regurgitation is mild. Mild to moderate aortic valve stenosis. Aortic valve mean gradient measures 19.0 mmHg. Aortic valve Vmax measures 2.83 m/s.  8. The inferior vena cava is dilated in size with <50% respiratory variability, suggesting right atrial pressure of 15 mmHg. FINDINGS  Left Ventricle: Septal myectomy in 2011 with subsequent VSD repair, there appears to be no residual VSD. Left ventricular ejection fraction, by estimation, is 50 to 55%. The left ventricle has low normal function. The left ventricle has no regional wall  motion abnormalities. The left ventricular internal cavity size was normal in size. There is mild asymmetric left ventricular hypertrophy of the infero-lateral segment. Abnormal (paradoxical) septal motion, consistent with RV pacemaker. Left ventricular  diastolic parameters are consistent with Grade II diastolic dysfunction (pseudonormalization). The ratio of  pulmonic flow to systemic flow (Qp/Qs ratio) is 2.00. Right Ventricle: TAPSE and S' underestimated in the setting of tricuspid replacement, RV function is normal by visual estimation and RVOT VTI of 16.1cm. The right ventricular size is normal. No increase in right ventricular wall thickness. Right ventricular systolic function is normal. There is moderately elevated pulmonary artery systolic pressure. The tricuspid regurgitant velocity is 2.79 m/s, and with an assumed right atrial pressure of 15 mmHg, the estimated right ventricular systolic pressure is 46.1 mmHg. Left Atrium: Left atrial size was moderately dilated. Right Atrium: Right atrial size was moderately dilated. Pericardium: There is no evidence of pericardial effusion. Presence of epicardial fat layer. Mitral Valve: The mitral valve is abnormal. Moderate mitral annular calcification. Mild mitral valve regurgitation. MV peak gradient, 10.0 mmHg. The mean mitral valve gradient is 3.0 mmHg. Tricuspid Valve: The tricuspid valve has been replaced in 2011 with a 27mm Mosaic Bioprosthetic valve, which demonstrates severe stenosis with a mean gradient of . The tricuspid valve is has been repaired/replaced. Tricuspid valve regurgitation is moderate . Severe tricuspid stenosis. Aortic Valve: Aortic valve DVI of 0.30 suggests more moderate disease. The aortic valve is calcified. Aortic valve regurgitation is mild. Mild to moderate aortic stenosis is present. Aortic valve mean gradient measures 19.0 mmHg. Aortic valve peak gradient measures 32.0 mmHg. Aortic valve area, by VTI measures 0.93 cm. Pulmonic Valve: The pulmonic valve was normal in structure. Pulmonic valve regurgitation is not visualized. Aorta: The aortic root and ascending aorta are structurally normal, with no evidence of dilitation. Venous: The inferior vena cava is dilated in size with less than 50% respiratory variability, suggesting right atrial pressure of 15 mmHg. IAS/Shunts: The atrial  septum is grossly normal. The ratio of pulmonic flow to systemic flow (Qp/Qs ratio) is 2.00.  LEFT VENTRICLE PLAX 2D LVIDd:         5.00 cm LVIDs:         3.50 cm LV PW:         1.00  cm LV IVS:        0.80 cm LVOT diam:     2.00 cm LV SV:         60 LV SV Index:   34 LVOT Area:     3.14 cm  RIGHT VENTRICLE RV S prime:     6.64 cm/s RVOT diam:      2.65 cm TAPSE (M-mode): 1.6 cm LEFT ATRIUM             Index        RIGHT ATRIUM           Index LA diam:        4.30 cm 2.41 cm/m   RA Area:     22.00 cm LA Vol (A2C):   85.0 ml 47.58 ml/m  RA Volume:   66.50 ml  37.23 ml/m LA Vol (A4C):   71.1 ml 39.80 ml/m LA Biplane Vol: 77.8 ml 43.55 ml/m  AORTIC VALVE                     PULMONIC VALVE AV Area (Vmax):    1.00 cm      RVOT Peak grad: 2 mmHg AV Area (Vmean):   0.92 cm AV Area (VTI):     0.93 cm AV Vmax:           283.00 cm/s AV Vmean:          213.000 cm/s AV VTI:            0.642 m AV Peak Grad:      32.0 mmHg AV Mean Grad:      19.0 mmHg LVOT Vmax:         90.30 cm/s LVOT Vmean:        62.100 cm/s LVOT VTI:          0.191 m LVOT/AV VTI ratio: 0.30  AORTA Ao Root diam: 2.80 cm Ao Asc diam:  2.70 cm MITRAL VALVE                TRICUSPID VALVE MV Area (PHT): 3.43 cm     TV Peak grad:   14.1 mmHg MV Peak grad:  10.0 mmHg    TV Mean grad:   11.0 mmHg MV Mean grad:  3.0 mmHg     TV Vmax:        1.88 m/s MV Vmax:       1.58 m/s     TV Vmean:       164.5 cm/s MV Vmean:      82.8 cm/s    TV VTI:         1.00 msec MV Decel Time: 221 msec     TR Peak grad:   31.1 mmHg MV E velocity: 143.00 cm/s  TR Vmax:        279.00 cm/s MV A velocity: 81.60 cm/s MV E/A ratio:  1.75         SHUNTS                             Systemic VTI:  0.19 m                             Systemic Diam: 2.00 cm  Pulmonic VTI:  0.169 m                             Pulmonic Diam: 2.65 cm                             Qp/Qs:         1.56 Arta Lark Electronically signed by Arta Lark Signature Date/Time:  11/18/2023/3:26:06 PM    Final    CT Head Wo Contrast Result Date: 11/17/2023 CLINICAL DATA:  Altered mental status, fall yesterday, swelling to back of head. On anticoagulation. EXAM: CT HEAD WITHOUT CONTRAST CT CERVICAL SPINE WITHOUT CONTRAST TECHNIQUE: Multidetector CT imaging of the head and cervical spine was performed following the standard protocol without intravenous contrast. Multiplanar CT image reconstructions of the cervical spine were also generated. RADIATION DOSE REDUCTION: This exam was performed according to the departmental dose-optimization program which includes automated exposure control, adjustment of the mA and/or kV according to patient size and/or use of iterative reconstruction technique. COMPARISON:  CT head 10/15/2023, CT cervical spine 07/12/2020. FINDINGS: CT HEAD FINDINGS Brain: No acute intracranial hemorrhage. No CT evidence of acute infarct. Nonspecific hypoattenuation in the periventricular and subcortical white matter favored to reflect chronic microvascular ischemic changes. No edema, mass effect, or midline shift. The basilar cisterns are patent. Ventricles: The ventricles are normal. Vascular: Atherosclerotic calcifications of the carotid siphons. No hyperdense vessel. Skull: No acute or aggressive finding. Orbits: Orbits are symmetric. Sinuses: The visualized paranasal sinuses are clear. Other: Mastoid air cells are clear. CT CERVICAL SPINE FINDINGS Alignment: Straightening and slight reversal of the normal cervical lordosis. Trace anterolisthesis of C3 on C4 and trace retrolisthesis of C5 on C6. No facet subluxation or dislocation. Skull base and vertebrae: No acute fracture. No primary bone lesion or focal pathologic process. Soft tissues and spinal canal: No prevertebral fluid or swelling. No visible canal hematoma. Disc levels: Intervertebral disc space narrowing most pronounced at C5-6. Small disc bulges at multiple levels. No high-grade osseous spinal canal stenosis.  Facet arthrosis throughout the cervical spine. Foraminal narrowing most pronounced on the left at C3-4 and C4-5. Upper chest: No acute abnormality of the lung apices. Mildly patulous upper thoracic esophagus. Other: None. IMPRESSION: No CT evidence of acute intracranial abnormality. No acute fracture or traumatic malalignment of the cervical spine. Chronic and degenerative changes as above. Electronically Signed   By: Denny Flack M.D.   On: 11/17/2023 15:03   CT Cervical Spine Wo Contrast Result Date: 11/17/2023 CLINICAL DATA:  Altered mental status, fall yesterday, swelling to back of head. On anticoagulation. EXAM: CT HEAD WITHOUT CONTRAST CT CERVICAL SPINE WITHOUT CONTRAST TECHNIQUE: Multidetector CT imaging of the head and cervical spine was performed following the standard protocol without intravenous contrast. Multiplanar CT image reconstructions of the cervical spine were also generated. RADIATION DOSE REDUCTION: This exam was performed according to the departmental dose-optimization program which includes automated exposure control, adjustment of the mA and/or kV according to patient size and/or use of iterative reconstruction technique. COMPARISON:  CT head 10/15/2023, CT cervical spine 07/12/2020. FINDINGS: CT HEAD FINDINGS Brain: No acute intracranial hemorrhage. No CT evidence of acute infarct. Nonspecific hypoattenuation in the periventricular and subcortical white matter favored to reflect chronic microvascular ischemic changes. No edema, mass effect, or midline shift. The basilar cisterns are patent. Ventricles: The ventricles are normal. Vascular: Atherosclerotic calcifications of the carotid siphons. No hyperdense  vessel. Skull: No acute or aggressive finding. Orbits: Orbits are symmetric. Sinuses: The visualized paranasal sinuses are clear. Other: Mastoid air cells are clear. CT CERVICAL SPINE FINDINGS Alignment: Straightening and slight reversal of the normal cervical lordosis. Trace  anterolisthesis of C3 on C4 and trace retrolisthesis of C5 on C6. No facet subluxation or dislocation. Skull base and vertebrae: No acute fracture. No primary bone lesion or focal pathologic process. Soft tissues and spinal canal: No prevertebral fluid or swelling. No visible canal hematoma. Disc levels: Intervertebral disc space narrowing most pronounced at C5-6. Small disc bulges at multiple levels. No high-grade osseous spinal canal stenosis. Facet arthrosis throughout the cervical spine. Foraminal narrowing most pronounced on the left at C3-4 and C4-5. Upper chest: No acute abnormality of the lung apices. Mildly patulous upper thoracic esophagus. Other: None. IMPRESSION: No CT evidence of acute intracranial abnormality. No acute fracture or traumatic malalignment of the cervical spine. Chronic and degenerative changes as above. Electronically Signed   By: Denny Flack M.D.   On: 11/17/2023 15:03   DG Pelvis Portable Result Date: 11/17/2023 CLINICAL DATA:  Fall. EXAM: PORTABLE PELVIS 1-2 VIEWS COMPARISON:  CT abdomen/pelvis dated August 28, 2023. FINDINGS: There is no evidence of acute fracture or diastasis. Femoral heads are seated within the acetabula. Sacroiliac joints and pubic symphysis are anatomically aligned. IMPRESSION: No acute osseous abnormality identified. Electronically Signed   By: Mannie Seek M.D.   On: 11/17/2023 13:59   DG Chest Portable 1 View Result Date: 11/17/2023 CLINICAL DATA:  Pain after fall EXAM: PORTABLE CHEST 1 VIEW COMPARISON:  Chest x-ray 07/27/2023 and older FINDINGS: Enlarged cardiopericardial silhouette with slight prominence of the central vasculature. Calcified aorta. Sternal wires. Left upper chest battery pack with pacemaker leads along the heart. There are separate defibrillator leads overlying the cardiac shadow and extending caudal off the edge of the imaging field. Slight linear opacity left midlung likely scar or atelectasis is stable. No pneumothorax,  effusion or consolidation. Degenerative changes of the spine and shoulders. Overall if there is further concern of the sequela of trauma additional workup as clinically appropriate. IMPRESSION: Postop chest. Pacemaker. Separate defibrillator leads. Enlarged cardiac shadow. Left midlung scar or atelectasis. Electronically Signed   By: Adrianna Horde M.D.   On: 11/17/2023 13:52   EEG adult Result Date: 10/26/2023 Jhonny Moss, MD     10/27/2023  1:00 PM ELECTROENCEPHALOGRAM REPORT Date of Study: 10/26/2023 Patient's Name: AVIYAH SWETZ MRN: [id number] Date of Birth: [birth year] Referring Provider: Tex Filbert, PA-C Clinical History: This is an 83 year old woman with dementia and episodes of confusion/dizziness. EEG for classification. CNS Active Medications: Lamotrigine , Viibryd , Rexulti , Wellbutrin , Provigil  Technical Summary: A multichannel digital 1-hour EEG recording measured by the international 10-20 system with electrodes applied with paste and impedances below 5000 ohms performed as portable with EKG monitoring in an awake and asleep patient.  Hyperventilation and photic stimulation were not performed.  The digital EEG was referentially recorded, reformatted, and digitally filtered in a variety of bipolar and referential montages for optimal display. Description: The patient is awake and asleep during the recording.  During maximal wakefulness, there is a symmetric, medium voltage 6 Hz posterior dominant rhythm that attenuates with eye opening. This is admixed with a moderate amount of diffuse 4-5 Hz theta and small amount of diffuse 2-3 Hz delta slowing of the waking background, at times with triphasic morphology.  During drowsiness and sleep, there is an increase in theta and delta slowing of the  background with poorly formed vertex waves seen.  There were no epileptiform discharges or electrographic seizures seen.  EKG lead was unremarkable. Impression: This awake and asleep EEG is abnormal due to  mild to moderate diffuse slowing of the waking background. Clinical Correlation of the above findings indicates diffuse cerebral dysfunction that is non-specific in etiology and can be seen with hypoxic/ischemic injury, toxic/metabolic encephalopathies, neurodegenerative disorders, or medication effect.  The absence of epileptiform discharges does not rule out a clinical diagnosis of epilepsy.  Clinical correlation is advised. Rayfield Cairo, M.D.      Subjective: Patient seen and examined at bedside today.  She remains severely confused, agitated.  She was given Haldol  this morning.  Family at bedside.  They expressed desire for residential hospice/comfort care  Discharge Exam: Vitals:   11/19/23 2003 11/20/23 0748  BP: (!) 125/49 (!) 151/60  Pulse: 60 67  Resp: 17 18  Temp: 97.9 F (36.6 C) 98.1 F (36.7 C)  SpO2: 93%    Vitals:   11/19/23 1135 11/19/23 1620 11/19/23 2003 11/20/23 0748  BP: (!) 142/61 138/63 (!) 125/49 (!) 151/60  Pulse: 61 63 60 67  Resp: 18 20 17 18   Temp: 98.2 F (36.8 C) 98.1 F (36.7 C) 97.9 F (36.6 C) 98.1 F (36.7 C)  TempSrc: Oral Oral Axillary Axillary  SpO2: 98% 96% 93%   Weight:        General: Pt is not alert or  awake Cardiovascular: RRR, S1/S2 +, no rubs, no gallops Respiratory: CTA bilaterally, no wheezing, no rhonchi Abdominal: Soft, NT, ND, bowel sounds + Extremities: no edema, no cyanosis    The results of significant diagnostics from this hospitalization (including imaging, microbiology, ancillary and laboratory) are listed below for reference.     Microbiology: Recent Results (from the past 240 hours)  Urine Culture     Status: None (Preliminary result)   Collection Time: 11/17/23  3:21 PM   Specimen: In/Out Cath Urine  Result Value Ref Range Status   Specimen Description IN/OUT CATH URINE  Final   Special Requests NONE  Final   Culture   Final    CULTURE REINCUBATED FOR BETTER GROWTH Performed at [facility],  [address] [place], [place] [address]    Report Status PENDING  Incomplete     Labs: BNP (last 3 results) Recent Labs    05/16/23 0020  BNP 578.9*   Basic Metabolic Panel: Recent Labs  Lab 11/17/23 1132 11/18/23 0638 11/19/23 0543  NA 139 140 138  K 4.3 4.5 4.4  CL 102 103 104  CO2 29 27 23   GLUCOSE 116* 116* 125*  BUN 49* 43* 32*  CREATININE 3.09* 2.36* 1.78*  CALCIUM  10.7* 11.0* 10.8*   Liver Function Tests: Recent Labs  Lab 11/17/23 1132  AST 19  ALT 15  ALKPHOS 151*  BILITOT 0.8  PROT 6.8  ALBUMIN 3.5   No results for input(s): "LIPASE", "AMYLASE" in the last 168 hours. Recent Labs  Lab 11/17/23 1244  AMMONIA <10   CBC: Recent Labs  Lab 11/17/23 1132 11/18/23 0638  WBC 6.0 7.7  NEUTROABS 4.4  --   HGB 10.5* 10.4*  HCT 33.8* 33.5*  MCV 98.5 99.4  PLT 113* 109*   Cardiac Enzymes: No results for input(s): "CKTOTAL", "CKMB", "CKMBINDEX", "TROPONINI" in the last 168 hours. BNP: Invalid input(s): "POCBNP" CBG: No results for input(s): "GLUCAP" in the last 168 hours. D-Dimer No results for input(s): "DDIMER" in the last 72 hours.  Hgb A1c No results for input(s): "HGBA1C" in the last 72 hours. Lipid Profile No results for input(s): "CHOL", "HDL", "LDLCALC", "TRIG", "CHOLHDL", "LDLDIRECT" in the last 72 hours. Thyroid  function studies Recent Labs    11/17/23 1200  TSH 2.365   Anemia work up No results for input(s): "VITAMINB12", "FOLATE", "FERRITIN", "TIBC", "IRON", "RETICCTPCT" in the last 72 hours. Urinalysis    Component Value Date/Time   COLORURINE YELLOW 11/17/2023 1521   APPEARANCEUR HAZY (A) 11/17/2023 1521   LABSPEC 1.010 11/17/2023 1521   PHURINE 7.0 11/17/2023 1521   GLUCOSEU NEGATIVE 11/17/2023 1521   GLUCOSEU NEGATIVE 08/25/2023 1006   HGBUR LARGE (A) 11/17/2023 1521   BILIRUBINUR NEGATIVE 11/17/2023 1521   BILIRUBINUR negative 08/07/2023 1907   BILIRUBINUR neg 05/14/2012 1053   KETONESUR NEGATIVE 11/17/2023 1521    PROTEINUR NEGATIVE 11/17/2023 1521   UROBILINOGEN 1.0 08/25/2023 1006   NITRITE NEGATIVE 11/17/2023 1521   LEUKOCYTESUR LARGE (A) 11/17/2023 1521   Sepsis Labs Recent Labs  Lab 11/17/23 1132 11/18/23 0638  WBC 6.0 7.7   Microbiology Recent Results (from the past 240 hours)  Urine Culture     Status: None (Preliminary result)   Collection Time: 11/17/23  3:21 PM   Specimen: In/Out Cath Urine  Result Value Ref Range Status   Specimen Description IN/OUT CATH URINE  Final   Special Requests NONE  Final   Culture   Final    CULTURE REINCUBATED FOR BETTER GROWTH Performed at [facility], [address] [place], [place] [address]    Report Status PENDING  Incomplete    Please note: You were cared for by a hospitalist during your hospital stay. Once you are discharged, your primary care physician will handle any further medical issues. Please note that NO REFILLS for any discharge medications will be authorized once you are discharged, as it is imperative that you return to your primary care physician (or establish a relationship with a primary care physician if you do not have one) for your post hospital discharge needs so that they can reassess your need for medications and monitor your lab values.    Time coordinating discharge: 40 minutes  SIGNED:   Leona Rake, MD  [facility] 11/20/2023, 11:29 AM Pager [phone]  If 7PM-7AM, please contact night-coverage [email or web address] Password [email or web address]

## 2023-11-20 NOTE — TOC Transition Note (Addendum)
 Transition of Care [facility]) - Discharge Note   Patient Details  Name: Holly Simmons MRN: [id number] Date of Birth: 02-12-1941  [facility] CM/SW Contact:  Ronni Colace, RN Phone Number: 11/20/2023, 1:51 PM   Clinical Narrative:     Patient is comfort care and will be transitioning to hospice residential.  Med Neccesity done and printed. Awaiting which residential hospice the patient will be going to, and if there is a bed available.  1500 Patient will be going to [facility] place awaiting bed assignment will go by [facility] once bed assigned and report called.   Medical necessity done and printed Final next level of care: Hospice Medical Facility Barriers to Discharge: No Barriers Identified   Patient Goals and CMS Choice            Discharge Placement                 Residential hospice      Discharge Plan and Services Additional resources added to the After Visit Summary for                                       Social Drivers of Health (SDOH) Interventions SDOH Screenings   Food Insecurity: No Food Insecurity (11/19/2023)  Housing: Low Risk  (11/19/2023)  Transportation Needs: No Transportation Needs (11/19/2023)  Utilities: Not At Risk (11/19/2023)  Alcohol  Screen: Low Risk  (10/07/2023)  Depression (PHQ2-9): High Risk (10/28/2023)  Financial Resource Strain: Low Risk  (10/07/2023)  Physical Activity: Inactive (10/07/2023)  Social Connections: Patient Unable To Answer (11/19/2023)  Stress: Stress Concern Present (10/07/2023)  Tobacco Use: Low Risk  (11/17/2023)  Health Literacy: Inadequate Health Literacy (10/07/2023)     Readmission Risk Interventions     No data to display

## 2023-11-20 NOTE — Progress Notes (Signed)
 Pt  very aggressive, agitated throughout the night hitting and attempting to jump out the bed Pt did attempt to scratch and bite staff. 1:1 sitter at bedside. PRN ativan  given with no results of improvement. MD notified with concern for harm to patient and staff.

## 2023-11-20 NOTE — Progress Notes (Signed)
 OT Cancellation Note  Patient Details Name: Holly Simmons MRN: [id number] DOB: [birth year]   Cancelled Treatment:    Reason Eval/Treat Not Completed: Other (comment) OT and PTA speaking with family, and family is now wanting to go with hospice approach. OT will respectfully sign off at this time; it was a pleasure to be a part of Mrs. Hay's care team.   Mollie Anger E. Ranee Peasley, OTR/L Acute Rehabilitation Services [phone]   Vincent Greek 11/20/2023, 9:54 AM

## 2023-11-22 LAB — URINE CULTURE: Culture: >=100000 — AB

## 2023-11-24 ENCOUNTER — Ambulatory Visit

## 2023-12-01 ENCOUNTER — Telehealth: Payer: Self-pay | Admitting: Cardiovascular Disease

## 2023-12-01 NOTE — Telephone Encounter (Signed)
 Son Lenise Quince) called to report patient passed away on 12-02-23.

## 2023-12-03 DEATH — deceased

## 2024-10-20 ENCOUNTER — Ambulatory Visit

## 2024-10-20 ENCOUNTER — Encounter: Admitting: Internal Medicine
